# Patient Record
Sex: Female | Born: 1960 | Race: Black or African American | Hispanic: No | Smoking: Never smoker
Health system: Southern US, Community
[De-identification: ages and names within clinical notes are randomized; demographics above are authoritative.]

## PROBLEM LIST (undated history)

## (undated) DIAGNOSIS — Z9221 Personal history of antineoplastic chemotherapy: Secondary | ICD-10-CM

## (undated) DIAGNOSIS — M1990 Unspecified osteoarthritis, unspecified site: Secondary | ICD-10-CM

## (undated) DIAGNOSIS — Z803 Family history of malignant neoplasm of breast: Secondary | ICD-10-CM

## (undated) DIAGNOSIS — Z1379 Encounter for other screening for genetic and chromosomal anomalies: Secondary | ICD-10-CM

## (undated) DIAGNOSIS — E785 Hyperlipidemia, unspecified: Secondary | ICD-10-CM

## (undated) DIAGNOSIS — R0600 Dyspnea, unspecified: Secondary | ICD-10-CM

## (undated) DIAGNOSIS — Z973 Presence of spectacles and contact lenses: Secondary | ICD-10-CM

## (undated) DIAGNOSIS — Z1509 Genetic susceptibility to other malignant neoplasm: Secondary | ICD-10-CM

## (undated) DIAGNOSIS — E7800 Pure hypercholesterolemia, unspecified: Secondary | ICD-10-CM

## (undated) DIAGNOSIS — I10 Essential (primary) hypertension: Secondary | ICD-10-CM

## (undated) DIAGNOSIS — C50919 Malignant neoplasm of unspecified site of unspecified female breast: Secondary | ICD-10-CM

## (undated) DIAGNOSIS — F329 Major depressive disorder, single episode, unspecified: Secondary | ICD-10-CM

## (undated) DIAGNOSIS — R0989 Other specified symptoms and signs involving the circulatory and respiratory systems: Secondary | ICD-10-CM

## (undated) DIAGNOSIS — R971 Elevated cancer antigen 125 [CA 125]: Secondary | ICD-10-CM

## (undated) DIAGNOSIS — H9190 Unspecified hearing loss, unspecified ear: Secondary | ICD-10-CM

## (undated) DIAGNOSIS — K219 Gastro-esophageal reflux disease without esophagitis: Secondary | ICD-10-CM

## (undated) DIAGNOSIS — G8929 Other chronic pain: Secondary | ICD-10-CM

## (undated) DIAGNOSIS — Z8544 Personal history of malignant neoplasm of other female genital organs: Secondary | ICD-10-CM

## (undated) DIAGNOSIS — E119 Type 2 diabetes mellitus without complications: Secondary | ICD-10-CM

## (undated) DIAGNOSIS — R200 Anesthesia of skin: Secondary | ICD-10-CM

## (undated) DIAGNOSIS — R05 Cough: Secondary | ICD-10-CM

## (undated) DIAGNOSIS — D509 Iron deficiency anemia, unspecified: Secondary | ICD-10-CM

## (undated) DIAGNOSIS — Z1501 Genetic susceptibility to malignant neoplasm of breast: Secondary | ICD-10-CM

## (undated) DIAGNOSIS — Z9289 Personal history of other medical treatment: Secondary | ICD-10-CM

## (undated) DIAGNOSIS — Z800 Family history of malignant neoplasm of digestive organs: Secondary | ICD-10-CM

## (undated) DIAGNOSIS — Z923 Personal history of irradiation: Secondary | ICD-10-CM

## (undated) DIAGNOSIS — F32A Depression, unspecified: Secondary | ICD-10-CM

## (undated) DIAGNOSIS — Z171 Estrogen receptor negative status [ER-]: Secondary | ICD-10-CM

## (undated) DIAGNOSIS — C50512 Malignant neoplasm of lower-outer quadrant of left female breast: Secondary | ICD-10-CM

## (undated) DIAGNOSIS — H40001 Preglaucoma, unspecified, right eye: Secondary | ICD-10-CM

## (undated) DIAGNOSIS — D649 Anemia, unspecified: Secondary | ICD-10-CM

## (undated) HISTORY — PX: UPPER GI ENDOSCOPY: SHX6162

## (undated) HISTORY — DX: Genetic susceptibility to malignant neoplasm of breast: Z15.01

## (undated) HISTORY — PX: HERNIA REPAIR: SHX51

## (undated) HISTORY — PX: MULTIPLE TOOTH EXTRACTIONS: SHX2053

## (undated) HISTORY — DX: Genetic susceptibility to other malignant neoplasm: Z15.09

## (undated) HISTORY — DX: Family history of malignant neoplasm of breast: Z80.3

## (undated) HISTORY — DX: Encounter for other screening for genetic and chromosomal anomalies: Z13.79

## (undated) HISTORY — DX: Family history of malignant neoplasm of digestive organs: Z80.0

## (undated) HISTORY — PX: TUBAL LIGATION: SHX77

---

## 1998-01-16 ENCOUNTER — Inpatient Hospital Stay (HOSPITAL_COMMUNITY): Admission: AD | Admit: 1998-01-16 | Discharge: 1998-01-16 | Payer: Self-pay | Admitting: Obstetrics and Gynecology

## 1999-03-20 ENCOUNTER — Other Ambulatory Visit: Admission: RE | Admit: 1999-03-20 | Discharge: 1999-03-20 | Payer: Self-pay | Admitting: Gynecology

## 1999-06-12 ENCOUNTER — Encounter: Admission: RE | Admit: 1999-06-12 | Discharge: 1999-09-10 | Payer: Self-pay | Admitting: Sports Medicine

## 1999-09-01 ENCOUNTER — Encounter (INDEPENDENT_AMBULATORY_CARE_PROVIDER_SITE_OTHER): Payer: Self-pay | Admitting: Specialist

## 1999-09-01 ENCOUNTER — Other Ambulatory Visit: Admission: RE | Admit: 1999-09-01 | Discharge: 1999-09-01 | Payer: Self-pay | Admitting: Gynecology

## 2000-06-01 ENCOUNTER — Other Ambulatory Visit: Admission: RE | Admit: 2000-06-01 | Discharge: 2000-06-01 | Payer: Self-pay | Admitting: Gynecology

## 2000-06-18 ENCOUNTER — Ambulatory Visit (HOSPITAL_COMMUNITY): Admission: RE | Admit: 2000-06-18 | Discharge: 2000-06-21 | Payer: Self-pay | Admitting: Gynecology

## 2000-06-21 ENCOUNTER — Encounter: Payer: Self-pay | Admitting: Gynecology

## 2001-07-19 ENCOUNTER — Other Ambulatory Visit: Admission: RE | Admit: 2001-07-19 | Discharge: 2001-07-19 | Payer: Self-pay | Admitting: Gynecology

## 2001-08-23 ENCOUNTER — Inpatient Hospital Stay (HOSPITAL_COMMUNITY): Admission: AD | Admit: 2001-08-23 | Discharge: 2001-08-23 | Payer: Self-pay | Admitting: *Deleted

## 2001-09-17 ENCOUNTER — Inpatient Hospital Stay (HOSPITAL_COMMUNITY): Admission: AD | Admit: 2001-09-17 | Discharge: 2001-09-17 | Payer: Self-pay | Admitting: Gynecology

## 2001-10-24 ENCOUNTER — Emergency Department (HOSPITAL_COMMUNITY): Admission: EM | Admit: 2001-10-24 | Discharge: 2001-10-24 | Payer: Self-pay | Admitting: Emergency Medicine

## 2001-10-26 ENCOUNTER — Encounter: Admission: RE | Admit: 2001-10-26 | Discharge: 2002-01-24 | Payer: Self-pay | Admitting: Gynecology

## 2001-11-12 ENCOUNTER — Inpatient Hospital Stay (HOSPITAL_COMMUNITY): Admission: AD | Admit: 2001-11-12 | Discharge: 2001-11-12 | Payer: Self-pay | Admitting: Gynecology

## 2002-01-28 ENCOUNTER — Inpatient Hospital Stay: Admission: AD | Admit: 2002-01-28 | Discharge: 2002-01-28 | Payer: Self-pay | Admitting: Obstetrics and Gynecology

## 2002-02-02 ENCOUNTER — Inpatient Hospital Stay (HOSPITAL_COMMUNITY): Admission: AD | Admit: 2002-02-02 | Discharge: 2002-02-05 | Payer: Self-pay | Admitting: *Deleted

## 2002-02-02 ENCOUNTER — Encounter (INDEPENDENT_AMBULATORY_CARE_PROVIDER_SITE_OTHER): Payer: Self-pay | Admitting: *Deleted

## 2002-02-04 HISTORY — PX: TUBAL LIGATION: SHX77

## 2002-03-14 ENCOUNTER — Other Ambulatory Visit: Admission: RE | Admit: 2002-03-14 | Discharge: 2002-03-14 | Payer: Self-pay | Admitting: Gynecology

## 2002-07-20 ENCOUNTER — Encounter: Payer: Self-pay | Admitting: Emergency Medicine

## 2002-07-20 ENCOUNTER — Emergency Department (HOSPITAL_COMMUNITY): Admission: EM | Admit: 2002-07-20 | Discharge: 2002-07-20 | Payer: Self-pay | Admitting: Emergency Medicine

## 2003-02-27 ENCOUNTER — Emergency Department (HOSPITAL_COMMUNITY): Admission: EM | Admit: 2003-02-27 | Discharge: 2003-02-27 | Payer: Self-pay

## 2003-03-01 ENCOUNTER — Emergency Department (HOSPITAL_COMMUNITY): Admission: EM | Admit: 2003-03-01 | Discharge: 2003-03-01 | Payer: Self-pay | Admitting: Emergency Medicine

## 2003-03-15 ENCOUNTER — Emergency Department (HOSPITAL_COMMUNITY): Admission: EM | Admit: 2003-03-15 | Discharge: 2003-03-16 | Payer: Self-pay | Admitting: Emergency Medicine

## 2004-05-30 ENCOUNTER — Other Ambulatory Visit: Admission: RE | Admit: 2004-05-30 | Discharge: 2004-05-30 | Payer: Self-pay | Admitting: Gynecology

## 2004-08-08 ENCOUNTER — Emergency Department (HOSPITAL_COMMUNITY): Admission: EM | Admit: 2004-08-08 | Discharge: 2004-08-09 | Payer: Self-pay | Admitting: Emergency Medicine

## 2004-09-03 ENCOUNTER — Emergency Department (HOSPITAL_COMMUNITY): Admission: EM | Admit: 2004-09-03 | Discharge: 2004-09-04 | Payer: Self-pay | Admitting: Emergency Medicine

## 2004-09-04 ENCOUNTER — Inpatient Hospital Stay (HOSPITAL_COMMUNITY): Admission: EM | Admit: 2004-09-04 | Discharge: 2004-09-05 | Payer: Self-pay | Admitting: Psychiatry

## 2004-09-04 ENCOUNTER — Ambulatory Visit: Payer: Self-pay | Admitting: Psychiatry

## 2005-07-23 ENCOUNTER — Other Ambulatory Visit: Admission: RE | Admit: 2005-07-23 | Discharge: 2005-07-23 | Payer: Self-pay | Admitting: Gynecology

## 2005-12-22 ENCOUNTER — Ambulatory Visit (HOSPITAL_COMMUNITY): Admission: RE | Admit: 2005-12-22 | Discharge: 2005-12-22 | Payer: Self-pay | Admitting: Gynecology

## 2005-12-29 ENCOUNTER — Ambulatory Visit (HOSPITAL_COMMUNITY): Admission: RE | Admit: 2005-12-29 | Discharge: 2005-12-29 | Payer: Self-pay | Admitting: Gastroenterology

## 2006-03-19 ENCOUNTER — Other Ambulatory Visit (HOSPITAL_COMMUNITY): Admission: RE | Admit: 2006-03-19 | Discharge: 2006-04-05 | Payer: Self-pay | Admitting: Psychiatry

## 2006-03-23 ENCOUNTER — Ambulatory Visit: Payer: Self-pay | Admitting: Psychiatry

## 2006-04-14 ENCOUNTER — Ambulatory Visit (HOSPITAL_COMMUNITY): Payer: Self-pay | Admitting: Psychiatry

## 2006-05-05 ENCOUNTER — Ambulatory Visit (HOSPITAL_COMMUNITY): Payer: Self-pay | Admitting: Psychiatry

## 2006-10-10 ENCOUNTER — Emergency Department (HOSPITAL_COMMUNITY): Admission: EM | Admit: 2006-10-10 | Discharge: 2006-10-10 | Payer: Self-pay | Admitting: Emergency Medicine

## 2006-11-03 ENCOUNTER — Ambulatory Visit (HOSPITAL_COMMUNITY): Payer: Self-pay | Admitting: Psychiatry

## 2006-12-01 ENCOUNTER — Ambulatory Visit (HOSPITAL_COMMUNITY): Payer: Self-pay | Admitting: Psychiatry

## 2007-01-24 ENCOUNTER — Ambulatory Visit (HOSPITAL_COMMUNITY): Payer: Self-pay | Admitting: Psychiatry

## 2007-02-02 ENCOUNTER — Ambulatory Visit (HOSPITAL_COMMUNITY): Payer: Self-pay | Admitting: Psychiatry

## 2007-03-14 ENCOUNTER — Ambulatory Visit (HOSPITAL_COMMUNITY): Payer: Self-pay | Admitting: Psychiatry

## 2007-05-30 ENCOUNTER — Other Ambulatory Visit: Admission: RE | Admit: 2007-05-30 | Discharge: 2007-05-30 | Payer: Self-pay | Admitting: Gynecology

## 2007-06-10 HISTORY — PX: BREAST LUMPECTOMY: SHX2

## 2007-11-30 ENCOUNTER — Ambulatory Visit (HOSPITAL_COMMUNITY): Payer: Self-pay | Admitting: Psychiatry

## 2007-12-14 ENCOUNTER — Ambulatory Visit (HOSPITAL_COMMUNITY): Payer: Self-pay | Admitting: Psychiatry

## 2008-01-20 ENCOUNTER — Encounter: Admission: RE | Admit: 2008-01-20 | Discharge: 2008-01-20 | Payer: Self-pay | Admitting: Gastroenterology

## 2008-01-25 ENCOUNTER — Ambulatory Visit (HOSPITAL_COMMUNITY): Payer: Self-pay | Admitting: Psychiatry

## 2008-01-30 ENCOUNTER — Ambulatory Visit (HOSPITAL_COMMUNITY): Admission: RE | Admit: 2008-01-30 | Discharge: 2008-01-30 | Payer: Self-pay | Admitting: Gastroenterology

## 2008-06-13 ENCOUNTER — Other Ambulatory Visit: Admission: RE | Admit: 2008-06-13 | Discharge: 2008-06-13 | Payer: Self-pay | Admitting: Gynecology

## 2008-06-13 ENCOUNTER — Ambulatory Visit: Payer: Self-pay | Admitting: Gynecology

## 2009-02-05 ENCOUNTER — Ambulatory Visit: Payer: Self-pay | Admitting: Gynecology

## 2010-04-10 ENCOUNTER — Encounter: Payer: Self-pay | Admitting: Physician Assistant

## 2010-04-10 ENCOUNTER — Ambulatory Visit: Payer: Self-pay | Admitting: Internal Medicine

## 2010-04-10 DIAGNOSIS — H9190 Unspecified hearing loss, unspecified ear: Secondary | ICD-10-CM

## 2010-04-10 DIAGNOSIS — F329 Major depressive disorder, single episode, unspecified: Secondary | ICD-10-CM | POA: Insufficient documentation

## 2010-04-10 DIAGNOSIS — R5381 Other malaise: Secondary | ICD-10-CM

## 2010-04-10 DIAGNOSIS — R5383 Other fatigue: Secondary | ICD-10-CM

## 2010-04-21 ENCOUNTER — Ambulatory Visit: Payer: Self-pay | Admitting: Physician Assistant

## 2010-04-21 LAB — CONVERTED CEMR LAB
RBC Folate: 813 ng/mL — ABNORMAL HIGH (ref 180–600)
Vitamin B-12: 646 pg/mL (ref 211–911)

## 2010-04-22 ENCOUNTER — Telehealth: Payer: Self-pay | Admitting: Physician Assistant

## 2010-04-22 DIAGNOSIS — D509 Iron deficiency anemia, unspecified: Secondary | ICD-10-CM

## 2010-05-02 ENCOUNTER — Telehealth: Payer: Self-pay | Admitting: Physician Assistant

## 2010-05-05 ENCOUNTER — Telehealth: Payer: Self-pay | Admitting: Physician Assistant

## 2010-05-07 ENCOUNTER — Ambulatory Visit (HOSPITAL_COMMUNITY): Admission: RE | Admit: 2010-05-07 | Discharge: 2010-05-07 | Payer: Self-pay | Admitting: Internal Medicine

## 2010-05-09 LAB — CONVERTED CEMR LAB
AST: 14 units/L (ref 0–37)
Basophils Absolute: 0 10*3/uL (ref 0.0–0.1)
Calcium: 9 mg/dL (ref 8.4–10.5)
Chloride: 102 meq/L (ref 96–112)
Creatinine, Ser: 0.6 mg/dL (ref 0.40–1.20)
Eosinophils Absolute: 0.1 10*3/uL (ref 0.0–0.7)
Eosinophils Relative: 0 % (ref 0–5)
Glucose, Bld: 80 mg/dL (ref 70–99)
MCHC: 31.2 g/dL (ref 30.0–36.0)
MCV: 87.4 fL (ref 78.0–100.0)
Monocytes Absolute: 0.9 10*3/uL (ref 0.1–1.0)
Monocytes Relative: 7 % (ref 3–12)
Neutrophils Relative %: 63 % (ref 43–77)
OCCULT 1: NEGATIVE
OCCULT 2: NEGATIVE
Potassium: 4.2 meq/L (ref 3.5–5.3)
RDW: 16.4 % — ABNORMAL HIGH (ref 11.5–15.5)
Sodium: 139 meq/L (ref 135–145)
Total Bilirubin: 0.2 mg/dL — ABNORMAL LOW (ref 0.3–1.2)
WBC: 13 10*3/uL — ABNORMAL HIGH (ref 4.0–10.5)

## 2010-06-02 ENCOUNTER — Encounter: Payer: Self-pay | Admitting: Physician Assistant

## 2010-06-06 ENCOUNTER — Ambulatory Visit: Payer: Self-pay | Admitting: Physician Assistant

## 2010-06-06 DIAGNOSIS — K2990 Gastroduodenitis, unspecified, without bleeding: Secondary | ICD-10-CM

## 2010-06-06 DIAGNOSIS — R82998 Other abnormal findings in urine: Secondary | ICD-10-CM

## 2010-06-06 DIAGNOSIS — D72829 Elevated white blood cell count, unspecified: Secondary | ICD-10-CM | POA: Insufficient documentation

## 2010-06-06 DIAGNOSIS — K5900 Constipation, unspecified: Secondary | ICD-10-CM

## 2010-06-06 DIAGNOSIS — K5909 Other constipation: Secondary | ICD-10-CM | POA: Insufficient documentation

## 2010-06-06 DIAGNOSIS — K2970 Gastritis, unspecified, without bleeding: Secondary | ICD-10-CM | POA: Insufficient documentation

## 2010-06-06 LAB — CONVERTED CEMR LAB
Bilirubin Urine: NEGATIVE
KOH Prep: NEGATIVE
Ketones, urine, test strip: NEGATIVE
Nitrite: NEGATIVE
Pap Smear: NEGATIVE
Urobilinogen, UA: 0.2
Whiff Test: NEGATIVE
pH: 5

## 2010-06-07 ENCOUNTER — Encounter: Payer: Self-pay | Admitting: Physician Assistant

## 2010-06-10 LAB — CONVERTED CEMR LAB
Bacteria, UA: NONE SEEN
Cholesterol, target level: 200 mg/dL
Eosinophils Absolute: 0.1 10*3/uL (ref 0.0–0.7)
Hemoglobin: 11.8 g/dL — ABNORMAL LOW (ref 12.0–15.0)
LDL Goal: 160 mg/dL
Lymphocytes Relative: 27 % (ref 12–46)
MCHC: 32.2 g/dL (ref 30.0–36.0)
MCV: 88 fL (ref 78.0–100.0)
Monocytes Absolute: 0.6 10*3/uL (ref 0.1–1.0)
Neutro Abs: 6.5 10*3/uL (ref 1.7–7.7)
RDW: 17.4 % — ABNORMAL HIGH (ref 11.5–15.5)
Squamous Epithelial / LPF: NONE SEEN /lpf
Total CHOL/HDL Ratio: 4.4
Triglycerides: 106 mg/dL (ref <150)
VLDL: 21 mg/dL (ref 0–40)
WBC: 9.9 10*3/uL (ref 4.0–10.5)

## 2010-06-11 ENCOUNTER — Encounter: Payer: Self-pay | Admitting: Physician Assistant

## 2010-09-23 ENCOUNTER — Telehealth (INDEPENDENT_AMBULATORY_CARE_PROVIDER_SITE_OTHER): Payer: Self-pay | Admitting: Nurse Practitioner

## 2010-10-05 ENCOUNTER — Encounter: Payer: Self-pay | Admitting: Gastroenterology

## 2010-10-05 ENCOUNTER — Encounter: Payer: Self-pay | Admitting: Gynecology

## 2010-10-14 NOTE — Progress Notes (Signed)
Summary: sleep study appt  Phone Note Outgoing Call   Summary of Call: F.Y.I. PT HAVE AN APPT 06-02-10 @ 8PM . PT AWARE OF THE APPT Initial call taken by: Cheryll Dessert,  May 05, 2010 12:28 PM

## 2010-10-14 NOTE — Progress Notes (Signed)
Summary: Iron deficient  Phone Note Outgoing Call   Summary of Call: She is iron deficient Needs to start iron (Ferrous Sulfate 325 mg three times a day x 3 mos., #90, 3 refills) Please call in to her pharmacy Schedule f/u cbc in 4 weeks. Make sure stool cards done. Initial call taken by: Tereso Newcomer PA-C,  April 22, 2010 8:51 PM  Follow-up for Phone Call        [facility] [address].... pt says she have two more stool cards to do... Follow-up by: Armenia Shannon,  April 23, 2010 9:07 AM  New Problems: UNSPECIFIED IRON DEFICIENCY ANEMIA (ICD-280.9)   New Problems: UNSPECIFIED IRON DEFICIENCY ANEMIA (ICD-280.9) New/Updated Medications: FERROUS SULFATE 325 (65 FE) MG TABS (FERROUS SULFATE) Take 1 tablet by mouth three times a day    Impression & Recommendations:  Problem # 1:  UNSPECIFIED IRON DEFICIENCY ANEMIA (ICD-280.9)  She is iron deficient Needs to start iron (Ferrous Sulfate 325 mg three times a day x 3 mos., #90, 3 refills) Please call in to her pharmacy Schedule f/u cbc in 4 weeks.  Her updated medication list for this problem includes:    Ferrous Sulfate 325 (65 Fe) Mg Tabs (Ferrous sulfate) .Marland Kitchen... Take 1 tablet by mouth three times a day  Complete Medication List: 1)  Ferrous Sulfate 325 (65 Fe) Mg Tabs (Ferrous sulfate) .... Take 1 tablet by mouth three times a day

## 2010-10-14 NOTE — Letter (Signed)
Summary: *HSN Results Follow up  [facility]  [address] [place], [place] [address]   Phone: [phone]  Fax: [phone]      06/11/2010   Erin [name] [address] [place], [place]  [address]   Dear  Ms. [name],                            ____S.Drinkard,FNP   ____D. Gore,FNP       ____B. McPherson,MD   ____V. Rankins,MD    ____E. Mulberry,MD    ____N. Daphine Deutscher, FNP  ____D. Reche Dixon, MD    ____K. Philipp Deputy, MD    __x__S. Alben Spittle, PA-C     This letter is to inform you that your recent test(s):  ___x____Pap Smear    _______Lab Test     _______X-ray    ___x____ is normal  _______ requires a medication change  _______ requires a follow-up lab visit  _______ requires a follow-up visit with your provider   Comments:       _________________________________________________________ If you have any questions, please contact our office                     Sincerely,  Tereso Newcomer PA-C [facility]

## 2010-10-14 NOTE — Progress Notes (Signed)
Summary: Split Night Referral  Phone Note Outgoing Call   Summary of Call: I spoke with Erin [name] 04-17-10 regarding her appt to Sleep study because the place can't contact her  she said that she is going to call them to make an appt but she haven't done it  and I call back again 04-21-10 and I leave a voice mail. Initial call taken by: Cheryll Dessert,  May 02, 2010 9:32 AM  Follow-up for Phone Call        thank you  let me know what happens Tereso Newcomer PA-C  May 04, 2010 9:10 PM  OK   Follow-up by: Cheryll Dessert,  May 05, 2010 9:05 AM

## 2010-10-14 NOTE — Letter (Signed)
Summary: PT INFORMATION SHEET  PT INFORMATION SHEET   Imported By: Arta Bruce 05/13/2010 12:58:25  _____________________________________________________________________  External Attachment:    Type:   Image     Comment:   External Document

## 2010-10-14 NOTE — Assessment & Plan Note (Signed)
Summary: CPP///BC   Vital Signs:  Patient profile:   50 year old female Menstrual status:  regular LMP:     05/24/2010 Height:      63.5 inches Weight:      169 pounds BMI:     29.57 Temp:     98.1 degrees F oral Pulse rate:   86 / minute Pulse rhythm:   regular Resp:     18 per minute BP sitting:   120 / 82  (left arm) Cuff size:   large  Vitals Entered By: Armenia Shannon (June 06, 2010 10:43 AM) CC: CPE, patient needs refill on Rx for Iron Pain Assessment Patient in pain? no       Does patient need assistance? Functional Status Self care Ambulation Normal LMP (date): 05/24/2010     Enter LMP: 05/24/2010   Primary Care Provider:  Tereso Newcomer, PA-C  CC:  CPE and patient needs refill on Rx for Iron.  History of Present Illness: Here for CPP. No pap in 1-2 years.  No h/o abnl paps. No abnormal bleeding, discharge, ithcing or burning. Mammo up to date. No FHx of breast cancer. Not taking calcium. PHQ9= 10 . . . Daughter is hard to take care of.  Denies being depressed.  Used to take meds.  Does not think she needs.  No suicidal thoughts.  Offered counseling.  She declined.   Problems Prior to Update: 1)  Gastritis  (ICD-535.50) 2)  Constipation  (ICD-564.00) 3)  Urinalysis, Abnormal  (ICD-791.9) 4)  Leukocytosis  (ICD-288.60) 5)  Unspecified Iron Deficiency Anemia  (ICD-280.9) 6)  Tubal Ligation, Hx of  (ICD-V26.51) 7)  Deafness  (ICD-389.9) 8)  Preventive Health Care  (ICD-V70.0) 9)  Fatigue  (ICD-780.79) 10)  Depression  (ICD-311)  Current Medications (verified): 1)  Ferrous Sulfate 325 (65 Fe) Mg Tabs (Ferrous Sulfate) .... Take 1 Tablet By Mouth Three Times A Day  Allergies (verified): No Known Drug Allergies  Past History:  Past Medical History: Depression   a.  admitted to behavioral health in 2006 ? h/o PUD stress incontinence ? h/o renal cyst (cannot find doc in [place]) Fatigue   a. waiting on patient to do sleep study  Review of  Systems      See HPI General:  Denies chills and fever. CV:  Denies chest pain or discomfort. Resp:  Complains of cough; denies sputum productive and wheezing; has a cough with weather changes. GI:  Complains of constipation; denies bloody stools, dark tarry stools, diarrhea, vomiting, and vomiting blood; feels bloated; stools darker since starting iron. GU:  Denies dysuria and hematuria. MS:  Denies joint pain. Derm:  Denies rash. Psych:  Denies suicidal thoughts/plans. Heme:  Denies bleeding.  Physical Exam  General:  alert, well-developed, and well-nourished.   Head:  normocephalic and atraumatic.   Eyes:  pupils equal, pupils round, and pupils reactive to light.   Ears:  R ear normal, L ear normal, and no external deformities.   Nose:  no external deformity.   Mouth:  pharynx pink and moist, no erythema, no exudates, and fair dentition.   Neck:  supple, no thyromegaly, and no cervical lymphadenopathy.   Breasts:  skin/areolae normal, no masses, no abnormal thickening, no nipple discharge, no tenderness, and no adenopathy.   Lungs:  normal breath sounds, no crackles, and no wheezes.   Heart:  normal rate, regular rhythm, and no murmur.   Abdomen:  soft, normal bowel sounds, no hepatomegaly, and epigastric tenderness.  Rectal:  no external abnormalities.   Genitalia:  normal introitus, no external lesions, no vaginal discharge, mucosa pink and moist, no vaginal or cervical lesions, no vaginal atrophy, no friaility or hemorrhage, normal uterus size and position, and no adnexal masses or tenderness.   Msk:  normal ROM.   Pulses:  R posterior tibial normal, R dorsalis pedis normal, L posterior tibial normal, and L dorsalis pedis normal.   Extremities:  trace left pedal edema and trace right pedal edema.   Neurologic:  alert & oriented X3 and cranial nerves II-XII intact.   Skin:  turgor normal.   Psych:  normally interactive.     Impression & Recommendations:  Problem # 1:   LEUKOCYTOSIS (ICD-288.60) repeat cbc  Orders: T-CBC w/Diff (16[address]-60454)  Problem # 2:  UNSPECIFIED IRON DEFICIENCY ANEMIA (ICD-280.9) repeat cbc  Her updated medication list for this problem includes:    Ferrous Sulfate 325 (65 Fe) Mg Tabs (Ferrous sulfate) .Marland Kitchen... Take 1 tablet by mouth three times a day  Orders: T-CBC w/Diff ([id number])  Problem # 3:  PREVENTIVE HEALTH CARE (ICD-V70.0) mammo done Td up to date  Orders: UA Dipstick w/o Micro (manual) (29562) KOH/ WET Mount ([phone]) T-Pap Smear, Thin Prep (57846) T-Lipid Profile (96295-28413)  Problem # 4:  FATIGUE (ICD-780.79) has not done sleep study yet needs to reschedule  Problem # 5:  GASTRITIS (ICD-535.50)  recent stool cards neg trial of pepcid x 6 weeks, then as needed  Her updated medication list for this problem includes:    Pepcid 20 Mg Tabs (Famotidine) .Marland Kitchen... Take 1 tablet by mouth two times a day  Problem # 6:  CONSTIPATION (ICD-564.00)  high fiber diet miralax as needed   Her updated medication list for this problem includes:    Miralax Powd (Polyethylene glycol 3350) .Marland Kitchen... Dissolve one capful in water or juice and drink one glass once daily as needed for constipation  Problem # 7:  DEPRESSION (ICD-311) phq9=10 but Sukhman Kocher denies sig depression and does not want counseling or medication  Complete Medication List: 1)  Ferrous Sulfate 325 (65 Fe) Mg Tabs (Ferrous sulfate) .... Take 1 tablet by mouth three times a day 2)  Miralax Powd (Polyethylene glycol 3350) .... Dissolve one capful in water or juice and drink one glass once daily as needed for constipation 3)  Pepcid 20 Mg Tabs (Famotidine) .... Take 1 tablet by mouth two times a day 4)  Caltrate 600+d 600-400 Mg-unit Tabs (Calcium carbonate-vitamin d) .... Take 1 tablet by mouth two times a day  Other Orders: T-Culture, Urine ([id number]) T- * Misc. Laboratory test [phone])  Patient Instructions: 1)  You have refills on your  prescription for Iron.  When you run out, you can call the pharmacy to refill. 2)  Please reschedule your sleep study. 3)  Take pepcid two times a day every day for 6 weeks.  Then, take two times a day as needed for indigestion or stomach pain. 4)  Eat a high fiber diet. 5)  Use Miralax once daily as needed for constipation. 6)  Please schedule a follow-up appointment in 2-3 months for your stomach.  7)  Take calcium +vitamin D daily.  Prescriptions: CALTRATE 600+D 600-400 MG-UNIT TABS (CALCIUM CARBONATE-VITAMIN D) Take 1 tablet by mouth two times a day  #60 x 11   Entered and Authorized by:   Tereso Newcomer PA-C   Signed by:   Tereso Newcomer PA-C on 06/06/2010   Method used:   Print then Give  to Patient   RxID:   [id number] PEPCID 20 MG TABS (FAMOTIDINE) Take 1 tablet by mouth two times a day  #60 x 3   Entered and Authorized by:   Tereso Newcomer PA-C   Signed by:   Tereso Newcomer PA-C on 06/06/2010   Method used:   Print then Give to Patient   RxID:   [id number] MIRALAX  POWD (POLYETHYLENE GLYCOL 3350) dissolve one capful in water or juice and drink one glass once daily as needed for constipation  #1 bottle x 3   Entered and Authorized by:   Tereso Newcomer PA-C   Signed by:   Tereso Newcomer PA-C on 06/06/2010   Method used:   Print then Give to Patient   RxID:   [id number] FERROUS SULFATE 325 (65 FE) MG TABS (FERROUS SULFATE) Take 1 tablet by mouth three times a day  #90 x 5   Entered and Authorized by:   Tereso Newcomer PA-C   Signed by:   Tereso Newcomer PA-C on 06/06/2010   Method used:   Print then Give to Patient   RxID:   [id number]   Laboratory Results   Urine Tests    Routine Urinalysis   Color: yellow Glucose: negative   (Normal Range: Negative) Bilirubin: negative   (Normal Range: Negative) Ketone: negative   (Normal Range: Negative) Spec. Gravity: >=1.030   (Normal Range: 1.003-1.035) Blood: small   (Normal Range: Negative) pH: 5.0   (Normal Range:  5.0-8.0) Protein: trace   (Normal Range: Negative) Urobilinogen: 0.2   (Normal Range: 0-1) Nitrite: negative   (Normal Range: Negative) Leukocyte Esterace: small   (Normal Range: Negative)      Wet Mount Source: vaginal WBC/hpf: 10-20 Bacteria/hpf: rare Clue cells/hpf: none  Negative whiff Yeast/hpf: none Wet Mount KOH: Negative Trichomonas/hpf: none

## 2010-10-14 NOTE — Assessment & Plan Note (Signed)
Summary: ** SIGN LANGUAGE, NEW PT EST CARE, GCCN CARD REQ MAMMOGRAM/LR   Vital Signs:  Patient profile:   50 year old female Menstrual status:  regular LMP:     03/09/2010 Weight:      163 pounds Temp:     97.8 degrees F oral Pulse rate:   65 / minute Pulse rhythm:   regular Resp:     18 per minute BP sitting:   100 / 60  (left arm) Cuff size:   large  Vitals Entered By: Tereso Newcomer,  PA-C CC: NP...Marland Kitchen pt says her bp is never this high.Marland KitchenMarland KitchenMarland KitchenMarland Kitchenpt wants to meet the provider... pt wants appt for cpp Is Patient Diabetic? No Pain Assessment Patient in pain? no       Does patient need assistance? Functional Status Self care Ambulation Normal LMP (date): 03/09/2010     Menstrual Status regular Enter LMP: 03/09/2010   Primary Care Provider:  Tereso Newcomer, PA-C  CC:  NP...Marland Kitchen pt says her bp is never this high.Marland KitchenMarland KitchenMarland KitchenMarland Kitchenpt wants to meet the provider... pt wants appt for cpp.  History of Present Illness: New Patient. Previously seeing Dr. Lily Peer in [place] (ob/gyn) for all care.  She is deaf and is here with an interpreter.  Here to est. Health maint: Periods regular.  Heavy for 2-3 days.   Td - unknown. No mammo in years. Last pap 2009.  She does note a history of awakening with shortness of breath for 10+ years now.  She feels uncomfortable when she lays flat.  She denies chest pain.  She denies exertional shortness of breath.  She denies cough or pedal edema.  She denies any history of heart disease.  She does feel quite fatigued.  She will fall asleep during the day.  She denies waking up with a headache.  She is not sure she snores.  Habits & Providers  Alcohol-Tobacco-Diet     Tobacco Status: never  Exercise-Depression-Behavior     Drug Use: no  Current Medications (verified): 1)  None  Allergies (verified): No Known Drug Allergies  Past History:  Past Medical History: Depression   a.  admitted to behavioral health in 2006 ? h/o PUD stress incontinence ?  h/o renal cyst (cannot find doc in [place])  Past Surgical History: Tubal ligation  Family History: CAD - bro died with MI/pneumonia age 71 Family History Hypertension-mom, sis DM - PGM unknown cancer in grandparent  Social History: G3P2 Married separated Never Smoked Alcohol use-no (rare alcohol) Drug use-no Smoking Status:  never Drug Use:  no  Review of Systems      See HPI General:  Denies chills and fever. CV:  See HPI. Resp:  Denies cough. GI:  Denies bloody stools, constipation, and dark tarry stools. GU:  Denies hematuria. Neuro:  See HPI. Psych:  Denies depression. Endo:  Denies cold intolerance and heat intolerance.  Physical Exam  General:  well-nourished, well-developed female in no acute distress Head:  normocephalic atraumatic Eyes:  PERRL, sclera are clear Ears:  TMs clear bilaterally Mouth:  oropharynx pink without exudate Neck:  supple without thyromegaly or lymphadenopathy Lungs:  clear to auscultation bilaterally, no wheezing, no rales Heart:  regular rate and rhythm, normal S1-S2, no murmur Abdomen:  soft, nontender, no organomegaly Neurologic:  cranial nerves II through XII intact Psych:  normally interactive.     Impression & Recommendations:  Problem # 1:  FATIGUE (ICD-780.79)  ?sleep apnea get sleep study ck labs  Orders: T-Comprehensive Metabolic Panel ([id number]) T-CBC w/Diff (  16109-60454) T-TSH [phone]) Split Night (Split Night)  Problem # 2:  PREVENTIVE HEALTH CARE (ICD-V70.0)  arrange mammo schedule cpp  Orders: T-CBC w/Diff ([id number]) Mammogram (Screening) (Mammo)  Patient Instructions: 1)  Please schedule a follow-up appointment in 6 weeks for CPP with Lita Flynn.  Come fasting for labs. 2)  Someone will call to schedule a sleep test to check for sleep apnea.   Appended Document: ** SIGN LANGUAGE, NEW PT EST CARE, GCCN CARD REQ MAMMOGRAM/LR     Allergies: No Known Drug Allergies   Other Orders: Tdap  => 73yrs IM [phone]) Admin 1st Vaccine (96295) Admin 1st Vaccine [facility]) [phone])    Tetanus/Td Vaccine    Vaccine Type: Tdap    Site: left deltoid    Mfr: Sanofi Pasteur    Dose: 0.5 ml    Route: IM    Given by: Levon Hedger    Exp. Date: 09/28/2011    Lot #: [id number]    VIS given: 08/02/07 version given April 10, 2010.

## 2010-10-14 NOTE — Progress Notes (Signed)
Summary: Office Visit/DEPRESSION SCREENING  Office Visit/DEPRESSION SCREENING   Imported By: Arta Bruce 06/20/2010 09:38:51  _____________________________________________________________________  External Attachment:    Type:   Image     Comment:   External Document

## 2010-10-16 NOTE — Progress Notes (Signed)
Summary: Coughing, fatigue  Phone Note Call from Patient   Summary of Call: Been sick for two weeks and wants to see doctor.  Started coughing, had a cold with pretty bad chest congestion, not feeling well, very weak, very tired.  Cough is productive of white mucus -- was yellow, now white, somewhat thick.  Did have CP in the beginning, not now.  Sweating a lot, feels "horrible."  Usually very active-now very lethargic.  Has been taking OTC Zyrtec thinking was allergies, makes her sleepy, helps a little.  Denies fever.  Cough is primarily when she's up, not at night.  Fatigue is the worst.   Was trying to take Delsun, but made her vomit.    Attempted to suggest home management and OTC medications for cough - pt. wants to be seen due to overwhelming fatigue/lethargy.  Advised that no appointments are available this week -- advised that only option is UC or ED for evaluation and treatment.  Verbalized understanding and agreement.    Initial call taken by: Dutch Quint RN,  September 23, 2010 2:50 PM

## 2011-01-30 NOTE — Consult Note (Signed)
[facility]  Patient:    Erin [name], Erin [name] Visit Number: [id number] MRN: [id number]          Service Type: OBS Location: [facility] Attending Physician:  Tonye Royalty Dictated by:   Gaetano Hawthorne. Lily Peer, M.D. 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  Some dizziness for the past several weeks with no nausea or vomiting.  She has complained mostly of reflux with bad taste in her mouth.  She tried Rolaids and Tums without any improvement in her symptoms.  Food, according to her, would make it worse.  This has only happened during the pregnancy.  She denies any ear pressure or pain.  She was recently treated with a Z-Pak for an upper respiratory tract infection.  She denies having passed out.  The dizziness started Thursday.  She is a gestational diabetic, diet controlled, and her blood sugars have been fine at home according to the patient.  There were no neurological complaints, and positive fetal movements were reported.  PHYSICAL EXAMINATION:  GENERAL:                      Well-developed, well-nourished female resting comfortably in the emergency room.  She had just gotten through eating and was totally asymptomatic.  VITAL SIGNS:                  Her orthostatics are as follows.  Blood pressure lying down was 115/65 with pulse of 82; sitting was 104/65 with a pulse of 79; standing was 103/55 with a pulse of 70.  Heart rate lying down was 97, sitting up was 106, and standing was 121.  HEENT:                        Unremarkable.  NECK:                         Supple.  Trachea midline.  No carotid bruits, no thyromegaly.  LUNGS:                        Clear to auscultation without rhonchi  or wheezes.  HEART:                        Regular rate and rhythm with no murmurs or gallops.  ABDOMEN/PELVIC:               Examinations not done.  DIAGNOSTIC DATA:              The patient had a 12-lead EKG in the emergency room which demonstrated normal sinus rhythm.  Her comprehensive metabolic panel was essentially unremarkable.  Her sugar was 83 mm/dl.  Hemoglobin 11.9, hematocrit 35.2, platelets count 295,000.  IMPRESSION:                   It appears the patient is suffering from gastroesophageal reflux attributed to colic and fundal pressure underneath her stomach.  She was instructed to continue her gestational diabetic diet as previously prescribed,  make sure she has adequate hydration, and her snacks. We will give her Reglan 10 mg to take 1 p.o. q.6-8h. p.r.n.  She has not seen her ENT for several years.  If her dizziness continues, she may need further evaluation for vertigo in the event she may be experiencing Menieres disease. Otherwise, the patient was told to report to the emergency room or [facility]. All of this was carried orally with the use of an interpreter, and written instructions are provided as well.  Of note, she was placed on the monitor, and there were no contractions demonstrated. Dictated by:   Gaetano Hawthorne Lily Peer, M.D. Attending Physician:  Tonye Royalty DD:  11/12/01 TD:  11/12/01 Job: [id number]

## 2011-01-30 NOTE — Consult Note (Signed)
[facility]  Patient:    Erin [name], Erin [name] Visit Number: [id number] MRN: [id number]          Service Type: OBS Location: [facility] Attending Physician:  Tonye Royalty Dictated by:   Gaetano Hawthorne. Lily Peer, M.D. 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  She denied any shortness of breath.  No tightness in her chest.  We did get an EKG, 12 lead, which was normal and her vital signs were blood pressure 113/67, respirations were 20 and pulse was 103 and temperature was 96.8 and fetal heart tones were appreciated.  PHYSICAL EXAMINATION:  EXTREMITIES:                  On examination, pain radiated from the point of compression to the base of the neck, the axilla, the shoulder and down to the forearm and hand.  She had a complaint of paresthesia along this area, especially the volar aspect of the fourth and fifth digits.  She had stated that sensory symptoms were aggravated at midnight, especially when sleeping or prolonged use of her extremities.  No muscle atrophy was seen per se or some mild weakness.  She did have good radial and popliteal pulses and good nailbed coloration.  ASSESSMENT AND PLAN:          I contacted the orthopedic surgeon on call, which was Dr. Patricia Nettle, and he had recommended the patient have an MRI of the neck.  My concern was the possibility that we may be dealing with a subclavian steal syndrome.  After the MRI, he  will take a look at it and determine further management.  Otherwise, for now, I told her she can apply heat and Tylenol and I will give her a note to keep her out of work until she follows up with the orthopedic surgeon next week, if he does not see her this weekend after the MRI.  All this was explained to the patient in written form, all questions were answered and will follow accordingly. Dictated by:   Gaetano Hawthorne Lily Peer, M.D. Attending Physician:  Tonye Royalty DD:  09/17/01 TD:  09/17/01 Job: [phone] [id number]

## 2011-01-30 NOTE — Op Note (Signed)
[facility]  Patient:    KAROLYNN, INFANTINO Visit Number: [id number] MRN: [id number]          Service Type: OBS Location: 910A 9132 01 Attending Physician:  Wetzel Bjornstad Dictated by:   Gaetano Hawthorne Lily Peer, M.D. Proc. Date: 02/02/02 Admit Date:  02/02/2002 Discharge Date: 02/05/2002                             Operative Report  INDICATIONS FOR OPERATION:    A 50 year old gravida 3, para 2, AB1 status post normal spontaneous vaginal delivery May 23.  Patient requesting elective permanent sterilization.  Previously counseled as to risks, benefits, pros and cons, and failure rates.  PREOPERATIVE DIAGNOSES:       Request for immediate postpartum tubal sterilization.  POSTOPERATIVE DIAGNOSES:      Request for immediate postpartum tubal sterilization.  ANESTHESIA:                   Epidural and 0.25% Marcaine local infiltration at incision site.  SURGEON:                      Juan H. Lily Peer, M.D.  PROCEDURE PERFORMED:          Postpartum tubal sterilization procedure, bilateral, Pomeroy technique.  DESCRIPTION OF OPERATION:     After the patient was adequately counseled, she was taken to the operating room where she was redosed through her epidural catheter.  Abdomen was prepped and draped in the usual sterile fashion.  For additional local anesthetic effect, the 0.25% Marcaine was infiltrated subcutaneously at the umbilical region whereby a semilunar incision was made. The incision as carried down to the fascia and the fascia was incised and the peritoneal cavity was entered.  With the utilization [employer] retractors for exposure and having placed the patient in Trendelenburg position with the use of a vein retractor, the left fallopian tube was brought into view and was grasped with a Babcock clamp.  The tube was identified to its distal end for proper identification.  The proximal portion of the fallopian tube was suture ligated with 3-0  Vicryl x2 and a 2 cm segment was incised and passed off the operative field for histologic evaluation.  The remaining stumps were Bovie cauterized.  Similar procedure was carried out on the contralateral side.  The fascia was reapproximated with a running stitch of 3-0 Vicryl suture and the skin was reapproximated with a subcuticular stitch of 4-0 plain catgut suture. Patient was transferred to the recovery room with stable vital signs.  Blood loss was minimal.  Fluid resuscitation consisted of 500 cc of lactated Ringers. Dictated by:   Gaetano Hawthorne Lily Peer, M.D. Attending Physician:  Wetzel Bjornstad DD:  02/04/02 TD:  02/07/02 Job: [id number]

## 2011-01-30 NOTE — Discharge Summary (Signed)
[facility]  Patient:    Erin [name], Erin [name] Visit Number: [id number] MRN: [id number]          Service Type: OBS Location: 910A 9132 01 Attending Physician:  Wetzel Bjornstad Dictated by:   Antony Contras, [facility] Admit Date:  02/02/2002 Discharge Date: 02/05/2002                             Discharge Summary  DISCHARGE DIAGNOSES:          1. Intrauterine pregnancy at term.                               2. History of gestational diabetes.  PROCEDURE:                    Induction of labor, normal spontaneous vaginal delivery of viable infant over midline episiotomy.  Postpartum BTL.  HISTORY OF PRESENT ILLNESS:   The patient is a 50 year old, gravida 3, para 1-0-1-1, with an LMP of April 25, 2001, [facility] Jan 30, 2002.  Prenatal risk factors include history of advanced maternal age, anti-M antibody, gestational diabetes.  The patient is also a deaf mute.  LABORATORY DATA:              Blood type A positive.  Anti-M antibody. RPR, HBSAG, and HIV nonreactive.  Amniocentesis normal.  HOSPITAL COURSE:              The patient was admitted on Feb 02, 2002, at 40-1/2 weeks for induction of labor secondary to her gestational diabetes. She did progress to complete dilatation and delivered an Apgars 8 and 9 female infant weighing 7 pounds 13 ounces.  Cord blood pH 7.19, over midline episiotomy without extension.  Placenta was intact.  Postpartum, the patient did wish to have a tubal sterilization and this procedure was performed by Gaetano Hawthorne. Lily Peer, M.D.  Postpartum tubal sterilization Pomeroy technique. The patient remained afebrile and had no difficulty voiding.  She was able to be discharged in satisfactory condition on her second postpartum day.  CBC; hematocrit 10.1, hemoglobin 30.1, platelets 186. Dictated by:   Antony Contras, [facility] Attending Physician:  Wetzel Bjornstad DD:  02/21/02 TD:  02/23/02 Job: [id number]

## 2011-01-30 NOTE — Discharge Summary (Signed)
Erin [name], Erin [name]                ACCOUNT NO.:  [id number]   MEDICAL RECORD NO.:  [id number]          PATIENT TYPE:  IPS   LOCATION:  0305                          FACILITY:  [facility]   PHYSICIAN:  Syed T. Arfeen, M.D.   DATE OF BIRTH:  Jan 27, 1961   DATE OF ADMISSION:  09/04/2004  DATE OF DISCHARGE:  09/05/2004                                 DISCHARGE SUMMARY   IDENTIFYING INFORMATION:  The patient is a 50 year old, separated African-  American female, voluntarily admitted on December 22.  Most of the  information was obtained through the interpreter as the patient is deaf.  The patient presents with a history of some depression for some time,  reported very stressed at work and home.  She was feeling frustrated and  left out at work.  She is here with her husband, again, whom she is  currently separated from and is apparently trying to take her 76-year-old  child.  The patient stated that she was feeling very distressed with low  self-esteem and left work yesterday feeling frustrated and having some vague  thoughts about hurting herself.  The patient reported that she has been on  Zoloft; however, she has been taking this on and off.  The patient denied  any psychotic symptoms and denied any current suicidal ideation.   PAST PSYCHIATRIC HISTORY:  This is the patient's first admission to [facility].  No other hospitalizations.  She has a history of  suicidal attempt a few years ago.   DRUG ALLERGIES:  No known drug allergies.  Denies any medical problem.   PHYSICAL EXAMINATION:  Essentially within normal limits.  Urine drug screen  was negative. CBC was normal.  TSH is 2.002, alcohol level was normal.  Chemistry was within normal limits.   MENTAL STATUS EXAMINATION:  The patient is an alert, oriented female with  good eye contact; interacting with the writers, case Production designer, theatre/television/film, and  interpreter.  The patient is deaf, able to communicate with the interpreter  and sign  languages.  She appears to be a little bit frustrated with anxious  mood.  Thought process clear, coherent, no evidence of psychosis, cognitive  function intact.  Memory good.  Insight judgment is adequate.   ADMITTING DIAGNOSES:  AXIS I.  Depressive disorder, NOS.  AXIS II.  Deferred.  AXIS III.  Deaf.  AXIS IV.  Problem with primary support group, occupation, and other  psychosocial problems.  AXIS V.  35.   HOSPITAL COURSE:  The patient was admitted and ordered routine p.r.n. in  standing medication.  She was started on Zoloft 50 mg daily and Ambien 10 mg  q.h.s. p.r.n. for sleep.  She was also encouraged to participate in group  therapy.  She was seen the next day with the help of interpreter at length  and she expressed that she was stressed out at job and was unable to take it  any more and made a suicidal remark and left the job.  She said that it was  an impulsive decision and was an impulsive remark.  She  denies any active or  passive suicidal thoughts.  Denies any homicidal thoughts.  She says that  she has 2 children, a 41-year-old and a 68 year old.  A telephone session,  done with the mother, who was very supportive and very involved with the  treatment plan. She stated that she is taking care of the kids.  The patient  says that she is on Zoloft on and off which helps, and has been given from  her OB/GYN.  She says that she has been tolerating the Zoloft; however,  sometimes she misses her medication.  She reported feeling better in the  morning and wanted to be discharged so that she can join her family.  The  patient's mother reported that her husband lives in [place], they have been  separated for some time. She told that husband is not any more planning to  take the kids back.  The mother assured that she will stay with the patient  until the patient started feeling better.  Extensive discharge planning was  discussed and the family appears to be very supportive of the  treatment  plan.  The patient denies any active or passive suicidal thoughts, appears  upbeat and feels better with the Zoloft.   She has reported a positive response to this crisis intervention.   CONDITION ON DISCHARGE:  Remarkably improved.  Mood stable, increased coping  and socialization skills, thought process clear, well directed, and no  suicidal thoughts, no psychotic symptoms.  Denies any perceptions, appears  motivated and excited with after care plan.   DISCHARGE MEDICATIONS:  Zoloft 50 mg once a day.   DISCHARGE DIAGNOSES:  AXIS I.  Depressive disorder, NOS.  AXIS II.  Deferred.  AXIS III.  Deaf.  AXIS IV.  Mild-to-moderate.  AXIS V.  70.   FOLLOWUP:  The patient was given a followup at [facility]  with _________ on Tuesday, January 3 at 10:15 p.m. and also with Dr.  Tomasa Rand on Monday, September 22, 2004 at 2 p.m. at [address]  The patient was also informed that she needed to call and arrange for an  interpreter for MD and therapist.  She was also encouraged to call [facility] [place] for counseling, phone number [phone].     Syed   STA/MEDQ  D:  09/17/2004  T:  09/17/2004  Job:  [id number]

## 2011-01-30 NOTE — H&P (Signed)
NAMEJAHZARA, SLATTERY                ACCOUNT NO.:  [id number]   MEDICAL RECORD NO.:  [id number]          PATIENT TYPE:  IPS   LOCATION:  0305                          FACILITY:  [facility]   PHYSICIAN:  Syed T. Arfeen, M.D.   DATE OF BIRTH:  March 17, 1961   DATE OF ADMISSION:  09/04/2004  DATE OF DISCHARGE:  09/05/2004                         PSYCHIATRIC ADMISSION ASSESSMENT   IDENTIFYING INFORMATION:  This is a 50 year old, separated, African-American  female, voluntarily admitted on September 04, 2004.   HISTORY OF PRESENT ILLNESS:  This is obtained through the services of an  interpreter, as patient is deaf.  Patient presents with a history of some  depression for some time, reporting very stressed at work and at home.  Feeling frustrated and left out of the interaction at work.  She states that  her husband, again, who she is currently separated from, is apparently  trying to take her 48 year old.  That is making her feel very distressed.  Patient apparently left work yesterday, feeling frustrated and was having  some fleeting thoughts of hurting herself.  She, at the time, felt unsafe  and came here to be assessed.  The patient was on Zoloft.  She has been off  her medications for some time.  Is denying any psychotic symptoms.  Denies  any current suicidal ideation.   PAST PSYCHIATRIC HISTORY:  First admission to [facility].  No  other hospitalizations.  Has a history of a suicide attempt years ago.   SOCIAL HISTORY:  This is a 50 year old, separated, African-American female;  separated for five months; has a 48 year old and 60 year old.  Her mother,  who is of support, lives in [place], is there for her and is currently  with the children.  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   FAMILY HISTORY:  Unclear.   ALCOHOL/DRUG HISTORY:  Denies any alcohol or drug use.   PRIMARY CARE Ameer Sanden:  Unknown.   PAST MEDICAL HISTORY:   None.   MEDICATIONS:  Was on Zoloft.  Has been noncompliant for some time.  Has been  on Paxil in the past.   DRUG ALLERGIES:  No known drug allergies.   PHYSICAL EXAMINATION:  Review of systems is noncontributory, although  patient is, as stated, deaf.  Denies any medical problems.  VITAL SIGNS:  Temperature is 98.1; 78 heart rate; 20 respirations; blood  pressure is 138/77.  GENERAL:  This is a well-nourished female in no acute distress.  She is  healthy appearing.  LUNGS:  Respirations are easy.  EXTREMITIES:  No clubbing or deformities.  SKIN:  No rashes or lacerations.  NEUROLOGIC:  Intact.   Urine drug screen negative.  CBC within normal limits.  TSH is 2.002.  Alcohol level less than 5.  CMET is within normal limits.   MENTAL STATUS EXAM:  She is an alert, oriented female with good eye contact.  Interacting well with Clinical research associate, case Production designer, theatre/television/film and interpreter.  Speech, as  stated, patient is deaf.  Mood - Patient feels  frustrated.  Her affect is  somewhat anxious.  Thought processes are coherent.  There is no evidence of  psychosis.  Cognitive function intact.  Memory is good.  Judgment and  insight are good.  She appears sincere.   ADMISSION DIAGNOSES:   AXIS I:  Depressive disorder, not otherwise specified.   AXIS II:  Deferred.   AXIS III:  Deaf.   AXIS IV:  Problems with primary support group, occupation and other  psychosocial problems.   AXIS V:  Current is 35; past year is 38.   PLAN:  Admission for suicidal thoughts, contract for safety, stabilize mood  and thinking.  We will resume Zoloft and reinforce medication compliance.  We will speak to a case manager about children and current worries about her  husband wanting to take her 74 year old.  Case manager will contact mother  about the children's safety and support.   TENTATIVE LENGTH OF STAY:  Three to four days.     Gilford Silvius   JO/MEDQ  D:  09/05/2004  T:  09/05/2004  Job:  [id number]

## 2011-01-30 NOTE — H&P (Signed)
[facility]  Patient:    Erin [name], Erin [name] Visit Number: [id number] MRN: [id number]          Service Type: OBS Location: 910B 9161 01 Attending Physician:  Wetzel Bjornstad Dictated by:   Katy Fitch, M.D. Admit Date:  02/02/2002                           History and Physical  CHIEF COMPLAINT:              40-week pregnancy and diabetic.  HISTORY OF PRESENT ILLNESS:   The patient is a 50 year old, G3, P1, at 40 weeks and 3 days, who presents to the office for routine visit.  The patient is noted to have a reactive stress test and good sugars under control. The patient is noted to have a cervix that is 1 cm dilated and 50% effaced. The patient had an amniocentesis earlier in pregnancy and noted to be a 46XX. She is also congenitally deaf.  The patient has been seen throughout her pregnancy having an interpreter and has good follow-up with her diabetes and stress test and her fetal ultrasound for growth on Jan 30, 2002, was noted to be 3500 g.  PAST MEDICAL HISTORY:         Deaf.  PAST SURGICAL HISTORY:        None.  PAST OBSTETRICAL HISTORY:     On vaginal delivery, 7 pounds 3 ounces.  MEDICATIONS:                  Prenatal vitamins.  ALLERGIES:                    None.  SOCIAL HISTORY:               Without any tobacco, alcohol, or drugs.  FAMILY HISTORY:               Without any mental retardation or epithelial cancers.  PHYSICAL EXAMINATION:         Blood pressure 130/78.  HEENT:                        Clear.  LUNGS:                        Clear to auscultation bilaterally.  HEART:                        Regular rate and rhythm.  ABDOMEN:                      Gravid and nontender.  Estimated fetal weight 8 pounds.  PELVIC:                       Cervix 150 and -2.  ASSESSMENT AND PLAN:          A 50 year old, G3, P1 at 40-1/2 weeks.  Will be induced for diabetes.  Will have Cervidil placed.  The patient is GBS negative.  The  patient will be started on Pitocin after 12 hours of Cervidil. Dictated by:   Katy Fitch, M.D. Attending Physician:  Wetzel Bjornstad DD:  02/02/02 TD:  02/02/02 Job: [id number]

## 2011-05-20 ENCOUNTER — Other Ambulatory Visit: Payer: Self-pay | Admitting: Gynecology

## 2011-05-20 DIAGNOSIS — Z1231 Encounter for screening mammogram for malignant neoplasm of breast: Secondary | ICD-10-CM

## 2011-06-03 ENCOUNTER — Ambulatory Visit (HOSPITAL_COMMUNITY): Admission: RE | Admit: 2011-06-03 | Payer: Self-pay | Source: Ambulatory Visit

## 2011-06-04 ENCOUNTER — Other Ambulatory Visit: Payer: Self-pay | Admitting: Gynecology

## 2011-06-04 DIAGNOSIS — Z1231 Encounter for screening mammogram for malignant neoplasm of breast: Secondary | ICD-10-CM

## 2011-06-12 ENCOUNTER — Ambulatory Visit (HOSPITAL_COMMUNITY)
Admission: RE | Admit: 2011-06-12 | Discharge: 2011-06-12 | Disposition: A | Discharge status: Home / Self Care | Payer: Self-pay | Source: Ambulatory Visit | Attending: Gynecology | Admitting: Gynecology

## 2011-06-12 DIAGNOSIS — Z1231 Encounter for screening mammogram for malignant neoplasm of breast: Secondary | ICD-10-CM

## 2012-06-13 ENCOUNTER — Ambulatory Visit (HOSPITAL_COMMUNITY): Payer: Self-pay | Attending: Gynecology

## 2013-08-02 ENCOUNTER — Encounter (HOSPITAL_COMMUNITY): Payer: Self-pay

## 2014-10-12 ENCOUNTER — Emergency Department (HOSPITAL_COMMUNITY)
Admission: EM | Admit: 2014-10-12 | Discharge: 2014-10-12 | Disposition: A | Discharge status: Home / Self Care | Payer: Self-pay | Attending: Emergency Medicine | Admitting: Emergency Medicine

## 2014-10-12 ENCOUNTER — Emergency Department (HOSPITAL_COMMUNITY): Payer: Self-pay

## 2014-10-12 ENCOUNTER — Encounter (HOSPITAL_COMMUNITY): Payer: Self-pay

## 2014-10-12 DIAGNOSIS — M545 Low back pain, unspecified: Secondary | ICD-10-CM

## 2014-10-12 DIAGNOSIS — R059 Cough, unspecified: Secondary | ICD-10-CM

## 2014-10-12 DIAGNOSIS — R05 Cough: Secondary | ICD-10-CM | POA: Insufficient documentation

## 2014-10-12 DIAGNOSIS — R0602 Shortness of breath: Secondary | ICD-10-CM | POA: Insufficient documentation

## 2014-10-12 LAB — BASIC METABOLIC PANEL
Anion gap: 4 — ABNORMAL LOW (ref 5–15)
BUN: 7 mg/dL (ref 6–23)
CHLORIDE: 105 mmol/L (ref 96–112)
CO2: 27 mmol/L (ref 19–32)
CREATININE: 0.6 mg/dL (ref 0.50–1.10)
Calcium: 8.7 mg/dL (ref 8.4–10.5)
GFR calc Af Amer: >90 mL/min (ref >90)
Glucose, Bld: 126 mg/dL — ABNORMAL HIGH (ref 70–99)
POTASSIUM: 3.8 mmol/L (ref 3.5–5.1)
Sodium: 136 mmol/L (ref 135–145)

## 2014-10-12 LAB — I-STAT TROPONIN, ED: TROPONIN I, POC: 0 ng/mL (ref 0.00–0.08)

## 2014-10-12 LAB — CBC WITH DIFFERENTIAL/PLATELET
BASOS ABS: 0 10*3/uL (ref 0.0–0.1)
BASOS PCT: 0 % (ref 0–1)
EOS ABS: 0.1 10*3/uL (ref 0.0–0.7)
Eosinophils Relative: 1 % (ref 0–5)
HEMATOCRIT: 33.9 % — AB (ref 36.0–46.0)
Hemoglobin: 10.8 g/dL — ABNORMAL LOW (ref 12.0–15.0)
LYMPHS PCT: 30 % (ref 12–46)
Lymphs Abs: 3.1 10*3/uL (ref 0.7–4.0)
MCH: 26.5 pg (ref 26.0–34.0)
MCHC: 31.9 g/dL (ref 30.0–36.0)
MCV: 83.3 fL (ref 78.0–100.0)
MONO ABS: 0.6 10*3/uL (ref 0.1–1.0)
Monocytes Relative: 6 % (ref 3–12)
Neutro Abs: 6.6 10*3/uL (ref 1.7–7.7)
Neutrophils Relative %: 63 % (ref 43–77)
PLATELETS: 281 10*3/uL (ref 150–400)
RBC: 4.07 MIL/uL (ref 3.87–5.11)
RDW: 15.9 % — AB (ref 11.5–15.5)
WBC: 10.3 10*3/uL (ref 4.0–10.5)

## 2014-10-12 LAB — BRAIN NATRIURETIC PEPTIDE: B NATRIURETIC PEPTIDE 5: 32.8 pg/mL (ref 0.0–100.0)

## 2014-10-12 MED ORDER — CYCLOBENZAPRINE HCL 10 MG PO TABS: 10.0000 mg | ORAL_TABLET | Freq: Two times a day (BID) | ORAL | Status: DC | PRN

## 2014-10-12 MED ORDER — BENZONATATE 100 MG PO CAPS: 100.0000 mg | ORAL_CAPSULE | Freq: Three times a day (TID) | ORAL | Status: DC

## 2014-10-12 MED ORDER — IBUPROFEN 600 MG PO TABS: 600.0000 mg | ORAL_TABLET | Freq: Four times a day (QID) | ORAL | Status: DC | PRN

## 2014-10-12 MED ORDER — PREDNISONE 20 MG PO TABS: 40.0000 mg | ORAL_TABLET | Freq: Every day | ORAL | Status: DC

## 2014-10-12 MED ORDER — PREDNISONE 20 MG PO TABS
60.0000 mg | ORAL_TABLET | Freq: Once | ORAL | Status: AC
  Administered 2014-10-12: 60 mg via ORAL
  Filled 2014-10-12: qty 3

## 2014-10-12 MED ORDER — IBUPROFEN 800 MG PO TABS
800.0000 mg | ORAL_TABLET | Freq: Once | ORAL | Status: AC
  Administered 2014-10-12: 800 mg via ORAL
  Filled 2014-10-12: qty 1

## 2014-10-12 NOTE — Discharge Instructions (Signed)
Acute Bronchitis Bronchitis is inflammation of the airways that extend from the windpipe into the lungs (bronchi). The inflammation often causes mucus to develop. This leads to a cough, which is the most common symptom of bronchitis.  In acute bronchitis, the condition usually develops suddenly and goes away over time, usually in a couple weeks. Smoking, allergies, and asthma can make bronchitis worse. Repeated episodes of bronchitis may cause further lung problems.  CAUSES Acute bronchitis is most often caused by the same virus that causes a cold. The virus can spread from person to person (contagious) through coughing, sneezing, and touching contaminated objects. SIGNS AND SYMPTOMS   Cough.   Fever.   Coughing up mucus.   Body aches.   Chest congestion.   Chills.   Shortness of breath.   Sore throat.  DIAGNOSIS  Acute bronchitis is usually diagnosed through a physical exam. Your health care provider will also ask you questions about your medical history. Tests, such as chest X-rays, are sometimes done to rule out other conditions.  TREATMENT  Acute bronchitis usually goes away in a couple weeks. Oftentimes, no medical treatment is necessary. Medicines are sometimes given for relief of fever or cough. Antibiotic medicines are usually not needed but may be prescribed in certain situations. In some cases, an inhaler may be recommended to help reduce shortness of breath and control the cough. A cool mist vaporizer may also be used to help thin bronchial secretions and make it easier to clear the chest.  HOME CARE INSTRUCTIONS  Get plenty of rest.   Drink enough fluids to keep your urine clear or pale yellow (unless you have a medical condition that requires fluid restriction). Increasing fluids may help thin your respiratory secretions (sputum) and reduce chest congestion, and it will prevent dehydration.   Take medicines only as directed by your health care provider.  If  you were prescribed an antibiotic medicine, finish it all even if you start to feel better.  Avoid smoking and secondhand smoke. Exposure to cigarette smoke or irritating chemicals will make bronchitis worse. If you are a smoker, consider using nicotine gum or skin patches to help control withdrawal symptoms. Quitting smoking will help your lungs heal faster.   Reduce the chances of another bout of acute bronchitis by washing your hands frequently, avoiding people with cold symptoms, and trying not to touch your hands to your mouth, nose, or eyes.   Keep all follow-up visits as directed by your health care provider.  SEEK MEDICAL CARE IF: Your symptoms do not improve after 1 week of treatment.  SEEK IMMEDIATE MEDICAL CARE IF:  You develop an increased fever or chills.   You have chest pain.   You have severe shortness of breath.  You have bloody sputum.   You develop dehydration.  You faint or repeatedly feel like you are going to pass out.  You develop repeated vomiting.  You develop a severe headache. MAKE SURE YOU:   Understand these instructions.  Will watch your condition.  Will get help right away if you are not doing well or get worse. Document Released: 10/08/2004 Document Revised: 01/15/2014 Document Reviewed: 02/21/2013 [facility] Patient Information 2015 [place], [place]. This information is not intended to replace advice given to you by your health care provider. Make sure you discuss any questions you have with your health care provider.  Back Pain, Adult Low back pain is very common. About 1 in 5 people have back pain.The cause of low back pain is  rarely dangerous. The pain often gets better over time.About half of people with a sudden onset of back pain feel better in just 2 weeks. About 8 in 10 people feel better by 6 weeks.  CAUSES Some common causes of back pain include:  Strain of the muscles or ligaments supporting the spine.  Wear and tear  (degeneration) of the spinal discs.  Arthritis.  Direct injury to the back. DIAGNOSIS Most of the time, the direct cause of low back pain is not known.However, back pain can be treated effectively even when the exact cause of the pain is unknown.Answering your caregiver's questions about your overall health and symptoms is one of the most accurate ways to make sure the cause of your pain is not dangerous. If your caregiver needs more information, he or she may order lab work or imaging tests (X-rays or MRIs).However, even if imaging tests show changes in your back, this usually does not require surgery. HOME CARE INSTRUCTIONS For many people, back pain returns.Since low back pain is rarely dangerous, it is often a condition that people can learn to [facility] their own.   Remain active. It is stressful on the back to sit or stand in one place. Do not sit, drive, or stand in one place for more than 30 minutes at a time. Take short walks on level surfaces as soon as pain allows.Try to increase the length of time you walk each day.  Do not stay in bed.Resting more than 1 or 2 days can delay your recovery.  Do not avoid exercise or work.Your body is made to move.It is not dangerous to be active, even though your back may hurt.Your back will likely heal faster if you return to being active before your pain is gone.  Pay attention to your body when you bend and lift. Many people have less discomfortwhen lifting if they bend their knees, keep the load close to their bodies,and avoid twisting. Often, the most comfortable positions are those that put less stress on your recovering back.  Find a comfortable position to sleep. Use a firm mattress and lie on your side with your knees slightly bent. If you lie on your back, put a pillow under your knees.  Only take over-the-counter or prescription medicines as directed by your caregiver. Over-the-counter medicines to reduce pain and inflammation  are often the most helpful.Your caregiver may prescribe muscle relaxant drugs.These medicines help dull your pain so you can more quickly return to your normal activities and healthy exercise.  Put ice on the injured area.  Put ice in a plastic bag.  Place a towel between your skin and the bag.  Leave the ice on for 15-20 minutes, 03-04 times a day for the first 2 to 3 days. After that, ice and heat may be alternated to reduce pain and spasms.  Ask your caregiver about trying back exercises and gentle massage. This may be of some benefit.  Avoid feeling anxious or stressed.Stress increases muscle tension and can worsen back pain.It is important to recognize when you are anxious or stressed and learn ways to manage it.Exercise is a great option. SEEK MEDICAL CARE IF:  You have pain that is not relieved with rest or medicine.  You have pain that does not improve in 1 week.  You have new symptoms.  You are generally not feeling well. SEEK IMMEDIATE MEDICAL CARE IF:   You have pain that radiates from your back into your legs.  You develop new bowel  or bladder control problems.  You have unusual weakness or numbness in your arms or legs.  You develop nausea or vomiting.  You develop abdominal pain.  You feel faint. Document Released: 08/31/2005 Document Revised: 03/01/2012 Document Reviewed: 01/02/2014 [facility] Patient Information 2015 [place], [place]. This information is not intended to replace advice given to you by your health care provider. Make sure you discuss any questions you have with your health care provider.   Back Exercises Back exercises help treat and prevent back injuries. The goal of back exercises is to increase the strength of your abdominal and back muscles and the flexibility of your back. These exercises should be started when you no longer have back pain. Back exercises include:  Pelvic Tilt. Lie on your back with your knees bent. Tilt your pelvis  until the lower part of your back is against the floor. Hold this position 5 to 10 sec and repeat 5 to 10 times.  Knee to Chest. Pull first 1 knee up against your chest and hold for 20 to 30 seconds, repeat this with the other knee, and then both knees. This may be done with the other leg straight or bent, whichever feels better.  Sit-Ups or Curl-Ups. Bend your knees 90 degrees. Start with tilting your pelvis, and do a partial, slow sit-up, lifting your trunk only 30 to 45 degrees off the floor. Take at least 2 to 3 seconds for each sit-up. Do not do sit-ups with your knees out straight. If partial sit-ups are difficult, simply do the above but with only tightening your abdominal muscles and holding it as directed.  Hip-Lift. Lie on your back with your knees flexed 90 degrees. Push down with your feet and shoulders as you raise your hips a couple inches off the floor; hold for 10 seconds, repeat 5 to 10 times.  Back arches. Lie on your stomach, propping yourself up on bent elbows. Slowly press on your hands, causing an arch in your low back. Repeat 3 to 5 times. Any initial stiffness and discomfort should lessen with repetition over time.  Shoulder-Lifts. Lie face down with arms beside your body. Keep hips and torso pressed to floor as you slowly lift your head and shoulders off the floor. Do not overdo your exercises, especially in the beginning. Exercises may cause you some mild back discomfort which lasts for a few minutes; however, if the pain is more severe, or lasts for more than 15 minutes, do not continue exercises until you see your caregiver. Improvement with exercise therapy for back problems is slow.  See your caregivers for assistance with developing a proper back exercise program. Document Released: 10/08/2004 Document Revised: 11/23/2011 Document Reviewed: 07/02/2011 [facility] Patient Information 2015 [place], [place]. This information is not intended to replace advice given to you by your  health care provider. Make sure you discuss any questions you have with your health care provider.   Emergency Department Resource Guide 1) Find a Doctor and Pay Out of Pocket Although you won't have to find out who is covered by your insurance plan, it is a good idea to ask around and get recommendations. You will then need to call the office and see if the doctor you have chosen will accept you as a new patient and what types of options they offer for patients who are self-pay. Some doctors offer discounts or will set up payment plans for their patients who do not have insurance, but you will need to ask so you aren't  surprised when you get to your appointment.  2) Contact Your Local Health Department Not all health departments have doctors that can see patients for sick visits, but many do, so it is worth a call to see if yours does. If you don't know where your local health department is, you can check in your phone book. The CDC also has a tool to help you locate your state's health department, and many state websites also have listings of all of their local health departments.  3) Find a [facility] If your illness is not likely to be very severe or complicated, you may want to try a walk in clinic. These are popping up all over the country in pharmacies, drugstores, and shopping centers. They're usually staffed by nurse practitioners or physician assistants that have been trained to treat common illnesses and complaints. They're usually fairly quick and inexpensive. However, if you have serious medical issues or chronic medical problems, these are probably not your best option.  No Primary Care Doctor: - Call Health Connect at  [phone] - they can help you locate a primary care doctor that  accepts your insurance, provides certain services, etc. - Physician Referral Service- [phone]  Chronic Pain Problems: Organization         Address  Phone   Notes  [facility]  [phone] Patients need to be referred by their primary care doctor.   Medication Assistance: Organization         Address  Phone   Notes  [facility] Medication [facility] [address] [place], [place] [address] [phone] --Must be a resident of [facility] -- Must have NO insurance coverage whatsoever (no Medicaid/ Medicare, etc.) -- The pt. MUST have a primary care doctor that directs their care regularly and follows them in the community   MedAssist  [phone]   [employer]  [phone]    Agencies that provide inexpensive medical care: Organization         Address  Phone   Notes  [place]  [phone]   [facility]    [phone]   [facility] [place], [place] [address] [phone]   [place] [address], [place] [phone]   Planned Parenthood    [phone]   [facility]    [phone]   [place] and [address], [place] Phone:  [phone], Fax:  [phone] Hours of Operation:  9 am - 6 pm, M-F.  Also accepts Medicaid/Medicare and self-pay.  [facility] for [place] [address], [place] Phone: [phone], Fax: [phone]. Hours of Operation:  8:30 am - 5:30 pm, M-F.  Also accepts Medicaid and self-pay.  [facility] [address] [place] Phone: [phone]   [place], [place], [place] [phone], Ext. [phone] Mondays & Thursdays: 7-9 AM.  First 15 patients are seen on a first come, first serve basis.    [place] Providers:  Organization         Address  Phone   Notes  [facility] [address], [place] [phone] Also accepts self-pay patients.  [place], [place],  [place]  [phone]   [place], [address], [place] [phone]   [place] [address], [place] [phone]   Lucianne Lei [address], [place]   [phone] Only accepts [place] Access [place] patients after they have their name applied to their card.   Self-Pay (no insurance) in [facility]:  Organization         Address  Phone   Notes  Sickle Cell Patients, [facility] Internal Medicine [place] [phone]   [facility] [place] [phone]   [facility] [place]  [place], [place], [place] [phone]   [facility]/Dr. Osei-Bonsu  [address] [place] or [address], [place] [phone] Phone number for both [place] and [place] locations is the same.  Urgent Medical and [facility] [address], [place] [phone]   [facility] [address], [place] or [address] [phone]   [facility] [address] [place] [phone], phone; [phone], fax Sees patients 1st and 3rd Saturday of every month.  Must not qualify for public or private insurance (i.e. Medicaid, Medicare, [place] Health Choice, Veterans' Benefits)  Household income should be no more than 200% of the poverty level The clinic cannot treat you if you are pregnant or think you are pregnant  Sexually transmitted diseases are not treated at the clinic.    Dental Care: Organization         Address  Phone  Notes  [facility] Department of [facility] [place] [phone] Accepts children up to age 43 who are enrolled in [place] or [place]; pregnant women with a Medicaid card; and children who have applied for Medicaid or [place] Health  Choice, but were declined, whose parents can pay a reduced fee at time of service.  [facility] Department of [facility]  [address], [place] [phone] Accepts children up to age 37 who are enrolled in [place] or [place]; pregnant women with a Medicaid card; and children who have applied for Medicaid or [place] Health Choice, but were declined, whose parents can pay a reduced fee at time of service.  [place] Adult Dental Access PROGRAM  [place] [phone] Patients are seen by appointment only. Walk-ins are not accepted. [place] will see patients 79 years of age and older. Monday - Tuesday (8am-5pm) Most Wednesdays (8:30-5pm) $30 per visit, cash only  [facility] Adult Dental Access PROGRAM  [address], [facility] [phone] Patients are seen by appointment only. Walk-ins are not accepted. [place] will see patients 19 years of age and older. One Wednesday Evening (Monthly: Volunteer Based).  $30 per visit, cash only  [place]  [phone] for adults; Children under age 70, call Graduate Pediatric Dentistry at [phone]. Children aged 52-14, please call [phone] to request a pediatric application.  Dental services are provided in all areas of dental care including fillings, crowns and bridges, complete and partial dentures, implants, gum treatment, root canals, and extractions. Preventive care is also provided. Treatment is provided to both adults and children. Patients are selected via a lottery and there is often a waiting list.  [facility] [address] [place]  [phone] [email or web address]   [facility] [address] [place], [place] [phone], Ext. [phone] Second and Fourth Thursday of each month, opens at 6:30 AM; Clinic ends at 9 AM.  Patients are seen on a first-come first-served basis, and a limited number are seen during each  clinic.   [facility]  [address] Hillard Danker [place], [place] [phone]   Eligibility Requirements You must have lived in [place], [place], or [place] counties for at least the last three months.   You cannot be eligible for state or federal sponsored [employer], including [employer], [place], or [employer].   You generally cannot be eligible for healthcare insurance through your employer.    How to apply: Eligibility screenings are held every Tuesday and Wednesday afternoon from 1:00 pm until 4:00 pm. You do not need an appointment for the interview!  [facility] [address], [place], [place]   [place]  [place] Department  [place]  [phone]    Behavioral Health Resources in the Community: Intensive Outpatient Programs Organization         Address  Phone  Notes  [place]. [address], [place], [place] [phone]   [facility] [address], [place], [place]   ADS: [facility] Drug [facility] [address] [place], [place]   [place] [address]  [place], [place] or [phone]   Substance Abuse Resources Organization         Address  Phone  Notes  Alcohol and Drug Services  [phone]   [place]  [phone]   [employer]   Chinita Pester  [phone]   Residential & Outpatient Substance Abuse Program  [phone]   Psychological Services Organization         Address  Phone  Notes  [facility] [place]  [place]  [phone]   [place] [address] [place] or [phone]    Mobile Crisis Teams Organization         Address  Phone  Notes  [facility]rapeutic Alternatives, Mobile  Crisis Care Unit  [phone]   [facility]  [address] [place], [place]   [place] [address], [place] [phone]    Self-Help/Support Groups Organization         Address  Phone             Notes  [place]. of [place] - variety of support groups  [place] Call for more information  Narcotics Anonymous (NA), [facility] [address], [employer] [place]  2 meetings at this location   Special educational needs teacher         Address  Phone  Notes  [facility] Residential Treatment [place],    [place]  [phone]   [facility]  [address] [place] [id number], [place], [place]   [place], [place] [phone] Admissions: 8am-3pm M-F  Incentives Substance [place] [address],    [place], [place] [phone]   [facility] [address] Jadene Pierini [place], [place]   [facility] [place]  Barbara Cower  [place], [place]   [facility]ight Programs - Intensive Outpatient [address], [place], [place]   [facility] ([place] [address]  [place], [place] [phone] or [phone]   [facility]idential Treatment Services (RTS) [address], [place], [place] Accepts Medicaid  [facility] [address],  [place] [phone] Substance Abuse/Addiction Treatment   [facility] Organization         Address  Phone  Notes  [facility]  [phone]   Domenic Schwab, PhD [address] Arlis Porta [place], [place]   [phone] or [phone]   [place], [place] [phone]   [place] [address], [place], [place] [phone] Insurance/Medicaid/sponsorship through [facility] [address]  [place]                                    [place], [place] [phone] [place] [address], [place] [phone]    Dr. Adele Schilder  [phone]   [facility] [place] Dept. 1) [address] [place] 2) [place] 3)  [place] [address], [place] [phone]  [phone]   [place] [phone] or [phone] (After Hours)

## 2014-10-12 NOTE — ED Provider Notes (Signed)
CSN: [id number]     Arrival date & time 10/12/14  1062 History   First MD Initiated Contact with Patient 10/12/14 0940     Chief Complaint  Patient presents with  . URI  . Back Pain     (Consider location/radiation/quality/duration/timing/severity/associated sxs/prior Treatment) Patient is a 54 y.o. female presenting with back pain. The history is provided by the patient. A language interpreter was used.  Back Pain Associated symptoms: no abdominal pain, no chest pain, no dysuria, no numbness and no weakness     Erin [name] is a 54 year old female with history of deafness who presents the ER complaining of cough, nasal congestion, back pain. History interpreted with ASL interpreter in the room with patient Patient reports her symptoms began approximately one month ago with a mild cough and nasal congestion. Patient reports the nasal congestion has since improved, however she continues to cough, and feels mild shortness of breath until she is able to cough something up. She states production with cough helps her shortness of breath subside. She states she is also having a "runny nose" in which she sometimes has difficulty breathing out of her nose. She denies any associated fever, chest pain, dizziness, weakness, nausea, vomiting, abdominal pain. Patient states her back pain is in her right lumbar region laterally, and reports it feels like a "pulled muscle". She states the pain began this morning acutely, and she feels like she "slept on it wrong". Patient states the pain is aggravated with any movement, alleviated with rest.  History reviewed. No pertinent past medical history. History reviewed. No pertinent past surgical history. History reviewed. No pertinent family history. History  Substance Use Topics  . Smoking status: Never Smoker   . Smokeless tobacco: Not on file  . Alcohol Use: No   OB History    No data available     Review of Systems  HENT: Negative for trouble  swallowing.   Eyes: Negative for visual disturbance.  Respiratory: Positive for shortness of breath.   Cardiovascular: Negative for chest pain.  Gastrointestinal: Negative for nausea, vomiting and abdominal pain.  Genitourinary: Negative for dysuria.  Musculoskeletal: Positive for back pain.  Skin: Negative for rash.  Neurological: Negative for dizziness, weakness and numbness.  Psychiatric/Behavioral: Negative.       Allergies  Review of patient's allergies indicates not on file.  Home Medications   Prior to Admission medications   Medication Sig Start Date End Date Taking? Authorizing Provider  benzonatate (TESSALON) 100 MG capsule Take 1 capsule (100 mg total) by mouth every 8 (eight) hours. 10/12/14   Carrie Mew, PA-C  cyclobenzaprine (FLEXERIL) 10 MG tablet Take 1 tablet (10 mg total) by mouth 2 (two) times daily as needed for muscle spasms. 10/12/14   Carrie Mew, PA-C  ibuprofen (ADVIL,MOTRIN) 600 MG tablet Take 1 tablet (600 mg total) by mouth every 6 (six) hours as needed. 10/12/14   Carrie Mew, PA-C  predniSONE (DELTASONE) 20 MG tablet Take 2 tablets (40 mg total) by mouth daily. 10/12/14   Carrie Mew, PA-C   BP 121/71 mmHg  Pulse 92  Temp(Src) 98.2 F (36.8 C) (Oral)  Resp 19  SpO2 100%  LMP  (LMP Unknown) Physical Exam  Constitutional: She is oriented to person, place, and time. She appears well-developed and well-nourished. No distress.  HENT:  Head: Normocephalic and atraumatic.  Mouth/Throat: Oropharynx is clear and moist. No oropharyngeal exudate.  Eyes: Right eye exhibits no discharge. Left eye exhibits no discharge.  No scleral icterus.  Neck: Normal range of motion.  Cardiovascular: Normal rate, regular rhythm and normal heart sounds.   No murmur heard. Pulmonary/Chest: Effort normal and breath sounds normal. No accessory muscle usage. No tachypnea. No respiratory distress.  Abdominal: Soft. Normal appearance. There is no tenderness. There is no  rigidity, no guarding, no tenderness at McBurney's point and negative Murphy's sign.  Musculoskeletal: Normal range of motion. She exhibits no edema or tenderness.  Neurological: She is alert and oriented to person, place, and time. No cranial nerve deficit. Coordination normal.  Skin: Skin is warm and dry. No rash noted. She is not diaphoretic.  Psychiatric: She has a normal mood and affect.    ED Course  Procedures (including critical care time) Labs Review Labs Reviewed  CBC WITH DIFFERENTIAL/PLATELET - Abnormal; Notable for the following:    Hemoglobin 10.8 (*)    HCT 33.9 (*)    RDW 15.9 (*)    All other components within normal limits  BASIC METABOLIC PANEL - Abnormal; Notable for the following:    Glucose, Bld 126 (*)    Anion gap 4 (*)    All other components within normal limits  BRAIN NATRIURETIC PEPTIDE  I-STAT TROPOININ, ED    Imaging Review Dg Chest 2 View  10/12/2014   CLINICAL DATA:  Shortness of breath, productive cough for 3 weeks, low back pain  EXAM: CHEST  2 VIEW  COMPARISON:  10/10/2006  FINDINGS: Cardiomediastinal silhouette is stable. No acute infiltrate or pleural effusion. No pulmonary edema. Mild degenerative changes mid thoracic spine.  IMPRESSION: No active cardiopulmonary disease.   Electronically Signed   By: Lahoma Crocker M.D.   On: 10/12/2014 14:23     EKG Interpretation None      MDM   Final diagnoses:  Cough  Right-sided low back pain without sciatica    Pt CXR negative for acute infiltrate. Patient well-appearing, afebrile, nontachypneic Cardec, nontachypneic, non-hypoxic, well-appearing and in no acute distress. Lab work unremarkable for acute pathology. EKG without evidence of acute injury or ectopy. Patient describing cough 4 weeks consistent with an acute bronchitis, patient's symptoms are consistent with URI, likely viral etiology. Discussed that antibiotics are not indicated for viral infections. Pt will be discharged with symptomatic  treatment.  Verbalizes understanding and is agreeable with plan. Pt is hemodynamically stable & in NAD prior to dc. Patient sent home on a short course of prednisone, and given symptomatic therapy.  Patient with back pain.  No neurological deficits and normal neuro exam.  Patient can walk but states is painful.  No loss of bowel or bladder control.  No concern for cauda equina.  No fever, night sweats, weight loss, h/o cancer, IVDU.  RICE protocol and pain medicine indicated and discussed with patient.  I spoke with social worker who also spoke with patient through interpreter to help patient set up primary care in the area. Social worker established a follow-up appointment with patient through the wellness clinic, and patient agreed to meet social worker there to help set up this appointment and follow up with a primary care doctor. I discussed return precautions with patient, and patient expresses understanding and agreement of this plan. I encouraged patient to return to the ER with any worsening of symptoms, or should she have any questions or concerns.  BP 121/71 mmHg  Pulse 92  Temp(Src) 98.2 F (36.8 C) (Oral)  Resp 19  SpO2 100%  LMP  (LMP Unknown)  Signed,  Dahlia Bailiff, PA-C  4:57 PM  Patient discussed with Dr. Virgel Manifold, MD   Carrie Mew, PA-C 10/12/14 1657  Virgel Manifold, MD 10/15/14 [phone]

## 2014-10-12 NOTE — ED Notes (Signed)
Paged for sign language interpreter

## 2014-10-12 NOTE — ED Notes (Signed)
Pt complains of chronic back pain, chronic elbow pain and cold sx x 3 weeks. Pt is deaf

## 2014-10-12 NOTE — ED Notes (Signed)
Pt made aware to return if symptoms worsen or if any life threatening symptoms occur.   

## 2014-11-07 ENCOUNTER — Encounter: Payer: Self-pay | Admitting: Internal Medicine

## 2014-11-07 ENCOUNTER — Ambulatory Visit: Payer: Self-pay | Attending: Internal Medicine | Admitting: Internal Medicine

## 2014-11-07 VITALS — BP 146/89 | HR 71 | Temp 98.1°F | Resp 16 | Ht 61.0 in | Wt 173.0 lb

## 2014-11-07 DIAGNOSIS — Z1211 Encounter for screening for malignant neoplasm of colon: Secondary | ICD-10-CM

## 2014-11-07 DIAGNOSIS — H9193 Unspecified hearing loss, bilateral: Secondary | ICD-10-CM | POA: Insufficient documentation

## 2014-11-07 DIAGNOSIS — Z79899 Other long term (current) drug therapy: Secondary | ICD-10-CM | POA: Insufficient documentation

## 2014-11-07 DIAGNOSIS — Z0000 Encounter for general adult medical examination without abnormal findings: Secondary | ICD-10-CM

## 2014-11-07 NOTE — Progress Notes (Signed)
Pt is here to establish care. Pt is here for a physical and a pap smear. Pt has an interpreter. Pt is deaf.

## 2014-11-07 NOTE — Progress Notes (Signed)
Patient ID: Erin [name], female   DOB: January 09, 1961, 54 y.o.   MRN: [id number]  CC: physical  HPI: Erin [name] is a 54 y.o. female here today to establish care and have a physical.  Patient has past medical history of deafness .  She is present with a interpreter today.  She has no past medical history and is not on any current medications.  She denies any vaginal itch, odor, lesion, or discharge. She has no C.C today. She denies tobacco, alcohol, and drug use.  Patient has No headache, No chest pain, No abdominal pain - No Nausea, No new weakness tingling or numbness, No Cough - SOB.  No Known Allergies No past medical history on file. Current Outpatient Prescriptions on File Prior to Visit  Medication Sig Dispense Refill  . ibuprofen (ADVIL,MOTRIN) 600 MG tablet Take 1 tablet (600 mg total) by mouth every 6 (six) hours as needed. 30 tablet 0  . benzonatate (TESSALON) 100 MG capsule Take 1 capsule (100 mg total) by mouth every 8 (eight) hours. (Patient not taking: Reported on 11/07/2014) 21 capsule 0  . cyclobenzaprine (FLEXERIL) 10 MG tablet Take 1 tablet (10 mg total) by mouth 2 (two) times daily as needed for muscle spasms. (Patient not taking: Reported on 11/07/2014) 20 tablet 0  . predniSONE (DELTASONE) 20 MG tablet Take 2 tablets (40 mg total) by mouth daily. (Patient not taking: Reported on 11/07/2014) 8 tablet 0   No current facility-administered medications on file prior to visit.   No family history on file. History   Social History  . Marital Status: Legally Separated    Spouse Name: N/A  . Number of Children: N/A  . Years of Education: N/A   Occupational History  . Not on file.   Social History Main Topics  . Smoking status: Never Smoker   . Smokeless tobacco: Not on file  . Alcohol Use: No  . Drug Use: No  . Sexual Activity: Not on file   Other Topics Concern  . Not on file   Social History Narrative  . No narrative on file    Review of  Systems: Constitutional: Negative for fever, chills, diaphoresis, activity change, appetite change and fatigue. HENT: Negative for ear pain, nosebleeds, congestion, facial swelling, rhinorrhea, neck pain, neck stiffness and ear discharge.  Eyes: Negative for pain, discharge, redness, itching and visual disturbance. Respiratory: Negative for cough, choking, chest tightness, shortness of breath, wheezing and stridor.  Cardiovascular: Negative for chest pain, palpitations and leg swelling. Gastrointestinal: Negative for abdominal distention. Genitourinary: Negative for dysuria, urgency, frequency, hematuria, flank pain, decreased urine volume, difficulty urinating and dyspareunia.  Musculoskeletal: Negative for back pain, joint swelling, arthralgias and gait problem. Neurological: Negative for dizziness, tremors, seizures, syncope, facial asymmetry, speech difficulty, weakness, light-headedness, numbness and headaches.  Hematological: Negative for adenopathy. Does not bruise/bleed easily. Psychiatric/Behavioral: Negative for hallucinations, behavioral problems, confusion, dysphoric mood, decreased concentration and agitation.    Objective:   Filed Vitals:   11/07/14 1244  BP: 146/89  Pulse:   Temp:   Resp:     Physical Exam: Constitutional: Patient appears well-developed and well-nourished. No distress. HENT: Normocephalic, atraumatic, External right and left ear normal. Oropharynx is clear and moist.  Eyes: Conjunctivae and EOM are normal. PERRLA, no scleral icterus. Neck: Normal ROM. Neck supple. No JVD. No tracheal deviation. No thyromegaly. CVS: RRR, S1/S2 +, no murmurs, no gallops, no carotid bruit.  Pulmonary: Effort and breath sounds normal, no stridor, rhonchi, wheezes, rales.  Abdominal: Soft. BS +,  no distension, tenderness, rebound or guarding.  Musculoskeletal: Normal range of motion. No edema and no tenderness.  Lymphadenopathy: No lymphadenopathy noted, cervical, inguinal  or axillary Neuro: Alert. Normal reflexes, muscle tone coordination. No cranial nerve deficit. Skin: Skin is warm and dry. No rash noted. Not diaphoretic. No erythema. No pallor. Psychiatric: Normal mood and affect. Behavior, judgment, thought content normal. Genitalia: Normal female without lesion, discharge or tenderness, NSSA, NT, no adnexal masses felt on exam  Breast: No tenderness, masses, or nipple abnormality     Lab Results  Component Value Date   WBC 10.3 10/12/2014   HGB 10.8* 10/12/2014   HCT 33.9* 10/12/2014   MCV 83.3 10/12/2014   PLT 281 10/12/2014   Lab Results  Component Value Date   CREATININE 0.60 10/12/2014   BUN 7 10/12/2014   NA 136 10/12/2014   K 3.8 10/12/2014   CL 105 10/12/2014   CO2 27 10/12/2014    Lab Results  Component Value Date   HGBA1C 8.9* 11/14/2014   Lipid Panel     Component Value Date/Time   CHOL 176 06/06/2010 2136   TRIG 106 06/06/2010 2136   HDL 40 06/06/2010 2136   CHOLHDL 4.4 Ratio 06/06/2010 2136   VLDL 21 06/06/2010 2136   LDLCALC 115* 06/06/2010 2136       Assessment and plan:   Erin [name] was seen today for establish care.  Diagnoses and all orders for this visit:  Colon cancer screening Orders: -     [facility] COLONOSCOPY  Annual physical exam Orders: -     MM Digital Screening; Future -     Cytology - PAP [place] -     Cervicovaginal ancillary only -     Hemoglobin A1c; Future -     Lipid panel; Future -     TSH; Future -     Vitamin D, 25-hydroxy; Future -     HIV antibody (with reflex); Future -     RPR; Future  Return in about 1 week (around 11/14/2014) for Lab Visit .  Due to language barrier, an interpreter was present during the history-taking and subsequent discussion (and for part of the physical exam) with this patient.   Chari Manning, NP-C [facility] [phone] 11/07/2014, 12:16 PM

## 2014-11-07 NOTE — Patient Instructions (Signed)

## 2014-11-08 LAB — CERVICOVAGINAL ANCILLARY ONLY
Chlamydia: NEGATIVE
Neisseria Gonorrhea: NEGATIVE
WET PREP (BD AFFIRM): NEGATIVE
Wet Prep (BD Affirm): NEGATIVE
Wet Prep (BD Affirm): POSITIVE — AB

## 2014-11-09 ENCOUNTER — Telehealth: Payer: Self-pay | Admitting: *Deleted

## 2014-11-09 LAB — CYTOLOGY - PAP

## 2014-11-09 MED ORDER — FLUCONAZOLE 150 MG PO TABS: 150.0000 mg | ORAL_TABLET | Freq: Every day | ORAL | Status: DC

## 2014-11-09 NOTE — Telephone Encounter (Signed)
-----   Message from Lance Bosch, NP sent at 11/08/2014  8:38 PM EST ----- Positive for yeast. Please send diflucan 150 mg to take PO once. Send 1 tablet with 1 refill. May repeat in one week if no improvement negative for STD's

## 2014-11-09 NOTE — Telephone Encounter (Signed)
Pt aware of results  Rx send to [facility]

## 2014-11-14 ENCOUNTER — Ambulatory Visit: Payer: Self-pay | Attending: Internal Medicine

## 2014-11-14 DIAGNOSIS — Z0000 Encounter for general adult medical examination without abnormal findings: Secondary | ICD-10-CM

## 2014-11-14 LAB — LIPID PANEL
CHOL/HDL RATIO: 4.3 ratio
Cholesterol: 152 mg/dL (ref 0–200)
HDL: 35 mg/dL — ABNORMAL LOW (ref >=46)
LDL Cholesterol: 96 mg/dL (ref 0–99)
TRIGLYCERIDES: 105 mg/dL (ref <150)
VLDL: 21 mg/dL (ref 0–40)

## 2014-11-14 LAB — HEMOGLOBIN A1C
HEMOGLOBIN A1C: 8.9 % — AB (ref <5.7)
Mean Plasma Glucose: 209 mg/dL — ABNORMAL HIGH (ref <117)

## 2014-11-14 LAB — TSH: TSH: 2.057 u[IU]/mL (ref 0.350–4.500)

## 2014-11-15 LAB — RPR

## 2014-11-15 LAB — HIV ANTIBODY (ROUTINE TESTING W REFLEX): HIV: NONREACTIVE

## 2014-11-15 LAB — VITAMIN D 25 HYDROXY (VIT D DEFICIENCY, FRACTURES): VIT D 25 HYDROXY: 39 ng/mL (ref 30–100)

## 2014-11-18 ENCOUNTER — Encounter: Payer: Self-pay | Admitting: Internal Medicine

## 2014-11-18 DIAGNOSIS — E119 Type 2 diabetes mellitus without complications: Secondary | ICD-10-CM | POA: Insufficient documentation

## 2014-11-19 ENCOUNTER — Other Ambulatory Visit: Payer: Self-pay | Admitting: Emergency Medicine

## 2014-11-19 ENCOUNTER — Telehealth: Payer: Self-pay | Admitting: Emergency Medicine

## 2014-11-19 ENCOUNTER — Telehealth: Payer: Self-pay | Admitting: Internal Medicine

## 2014-11-19 MED ORDER — METFORMIN HCL 500 MG PO TABS: 500.0000 mg | ORAL_TABLET | Freq: Two times a day (BID) | ORAL | Status: DC

## 2014-11-19 NOTE — Telephone Encounter (Signed)
Pt called back with sign language interpretor Given lab results with new diagnosis Diabetes Type 2 with medication instructions Pt instructed to start taking prescribed Metformin 500 mg tab BID with meals Pt given nurse appointment to go over new diagnosis with teaching on CBG machine/diet. Scheduled 12/07/14 @ 2pm

## 2014-11-19 NOTE — Telephone Encounter (Signed)
Attempted to reach pt with lab results; sign language interpretor unable to connect to the pt We will try again for technical issues

## 2014-11-19 NOTE — Telephone Encounter (Signed)
Patient is returning call from nurse. Please f/u with pt.

## 2014-11-19 NOTE — Telephone Encounter (Signed)
Patient called to request blood work results, patient also would like to get a med refill for her yeast infection. Please f/u with pt.

## 2014-11-20 ENCOUNTER — Telehealth: Payer: Self-pay | Admitting: Internal Medicine

## 2014-11-20 ENCOUNTER — Telehealth: Payer: Self-pay | Admitting: *Deleted

## 2014-11-20 NOTE — Telephone Encounter (Signed)
Pt called returning nurse's phone call, please f/u with pt  °

## 2014-11-20 NOTE — Telephone Encounter (Signed)
Used interpreter and left message informing the pt that her pap was normal and that we will repeat the pa in 3 years

## 2014-11-20 NOTE — Telephone Encounter (Signed)
-----   Message from Lance Bosch, NP sent at 11/18/2014  6:19 PM EST ----- Patient pap is negative for malignancies. Will repeat in 3 years.

## 2014-11-21 NOTE — Telephone Encounter (Signed)
Pt calling for lab results. Please f/u with pt.

## 2014-11-22 ENCOUNTER — Other Ambulatory Visit: Payer: Self-pay | Admitting: Internal Medicine

## 2014-11-22 DIAGNOSIS — Z1231 Encounter for screening mammogram for malignant neoplasm of breast: Secondary | ICD-10-CM

## 2014-12-05 ENCOUNTER — Ambulatory Visit: Payer: Self-pay

## 2014-12-07 ENCOUNTER — Other Ambulatory Visit: Payer: Self-pay

## 2014-12-10 ENCOUNTER — Ambulatory Visit
Admission: RE | Admit: 2014-12-10 | Discharge: 2014-12-10 | Disposition: A | Discharge status: Home / Self Care | Payer: No Typology Code available for payment source | Source: Ambulatory Visit | Attending: Internal Medicine | Admitting: Internal Medicine

## 2014-12-10 DIAGNOSIS — Z1231 Encounter for screening mammogram for malignant neoplasm of breast: Secondary | ICD-10-CM

## 2014-12-17 ENCOUNTER — Telehealth: Payer: Self-pay | Admitting: *Deleted

## 2014-12-17 ENCOUNTER — Ambulatory Visit: Payer: No Typology Code available for payment source | Attending: Internal Medicine | Admitting: *Deleted

## 2014-12-17 VITALS — BP 118/80 | HR 81 | Temp 98.0°F | Resp 18

## 2014-12-17 DIAGNOSIS — E1165 Type 2 diabetes mellitus with hyperglycemia: Secondary | ICD-10-CM

## 2014-12-17 LAB — POCT CBG (FASTING - GLUCOSE)-MANUAL ENTRY: Glucose Fasting, POC: 99 mg/dL (ref 70–99)

## 2014-12-17 MED ORDER — GLUCOSE BLOOD VI STRP: ORAL_STRIP | Status: DC

## 2014-12-17 MED ORDER — ACCU-CHEK SOFTCLIX LANCET DEV MISC: Status: DC

## 2014-12-17 MED ORDER — ACCU-CHEK AVIVA PLUS W/DEVICE KIT: PACK | Status: DC

## 2014-12-17 NOTE — Telephone Encounter (Signed)
-----   Message from Lance Bosch, NP sent at 12/10/2014  6:17 PM EDT ----- No evidence of malignancy on mammogram. Repeat in one year

## 2014-12-17 NOTE — Patient Instructions (Signed)
Diabetes Mellitus and Food It is important for you to manage your blood sugar (glucose) level. Your blood glucose level can be greatly affected by what you eat. Eating healthier foods in the appropriate amounts throughout the day at about the same time each day will help you control your blood glucose level. It can also help slow or prevent worsening of your diabetes mellitus. Healthy eating may even help you improve the level of your blood pressure and reach or maintain a healthy weight.  HOW CAN FOOD AFFECT ME? Carbohydrates Carbohydrates affect your blood glucose level more than any other type of food. Your dietitian will help you determine how many carbohydrates to eat at each meal and teach you how to count carbohydrates. Counting carbohydrates is important to keep your blood glucose at a healthy level, especially if you are using insulin or taking certain medicines for diabetes mellitus. Alcohol Alcohol can cause sudden decreases in blood glucose (hypoglycemia), especially if you use insulin or take certain medicines for diabetes mellitus. Hypoglycemia can be a life-threatening condition. Symptoms of hypoglycemia (sleepiness, dizziness, and disorientation) are similar to symptoms of having too much alcohol.  If your health care provider has given you approval to drink alcohol, do so in moderation and use the following guidelines:  Women should not have more than one drink per day, and men should not have more than two drinks per day. One drink is equal to:  12 oz of beer.  5 oz of wine.  1 oz of hard liquor.  Do not drink on an empty stomach.  Keep yourself hydrated. Have water, diet soda, or unsweetened iced tea.  Regular soda, juice, and other mixers might contain a lot of carbohydrates and should be counted. WHAT FOODS ARE NOT RECOMMENDED? As you make food choices, it is important to remember that all foods are not the same. Some foods have fewer nutrients per serving than other  foods, even though they might have the same number of calories or carbohydrates. It is difficult to get your body what it needs when you eat foods with fewer nutrients. Examples of foods that you should avoid that are high in calories and carbohydrates but low in nutrients include:  Trans fats (most processed foods list trans fats on the Nutrition Facts label).  Regular soda.  Juice.  Candy.  Sweets, such as cake, pie, doughnuts, and cookies.  Fried foods. WHAT FOODS CAN I EAT? Have nutrient-rich foods, which will nourish your body and keep you healthy. The food you should eat also will depend on several factors, including:  The calories you need.  The medicines you take.  Your weight.  Your blood glucose level.  Your blood pressure level.  Your cholesterol level. You also should eat a variety of foods, including:  Protein, such as meat, poultry, fish, tofu, nuts, and seeds (lean animal proteins are best).  Fruits.  Vegetables.  Dairy products, such as milk, cheese, and yogurt (low fat is best).  Breads, grains, pasta, cereal, rice, and beans.  Fats such as olive oil, trans fat-free margarine, canola oil, avocado, and olives. DOES EVERYONE WITH DIABETES MELLITUS HAVE THE SAME MEAL PLAN? Because every person with diabetes mellitus is different, there is not one meal plan that works for everyone. It is very important that you meet with a dietitian who will help you create a meal plan that is just right for you. Document Released: 05/28/2005 Document Revised: 09/05/2013 Document Reviewed: 07/28/2013 ExitCare Patient Information 2015 [employer]. This   information is not intended to replace advice given to you by your health care provider. Make sure you discuss any questions you have with your health care provider. Diabetes and Exercise Exercising regularly is important. It is not just about losing weight. It has many health benefits, such as:  Improving your overall  fitness, flexibility, and endurance.  Increasing your bone density.  Helping with weight control.  Decreasing your body fat.  Increasing your muscle strength.  Reducing stress and tension.  Improving your overall health. People with diabetes who exercise gain additional benefits because exercise:  Reduces appetite.  Improves the body's use of blood sugar (glucose).  Helps lower or control blood glucose.  Decreases blood pressure.  Helps control blood lipids (such as cholesterol and triglycerides).  Improves the body's use of the hormone insulin by:  Increasing the body's insulin sensitivity.  Reducing the body's insulin needs.  Decreases the risk for heart disease because exercising:  Lowers cholesterol and triglycerides levels.  Increases the levels of good cholesterol (such as high-density lipoproteins [HDL]) in the body.  Lowers blood glucose levels. YOUR ACTIVITY PLAN  Choose an activity that you enjoy and set realistic goals. Your health care provider or diabetes educator can help you make an activity plan that works for you. Exercise regularly as directed by your health care provider. This includes:  Performing resistance training twice a week such as push-ups, sit-ups, lifting weights, or using resistance bands.  Performing 150 minutes of cardio exercises each week such as walking, running, or playing sports.  Staying active and spending no more than 90 minutes at one time being inactive. Even short bursts of exercise are good for you. Three 10-minute sessions spread throughout the day are just as beneficial as a single 30-minute session. Some exercise ideas include:  Taking the dog for a walk.  Taking the stairs instead of the elevator.  Dancing to your favorite song.  Doing an exercise video.  Doing your favorite exercise with a friend. RECOMMENDATIONS FOR EXERCISING WITH TYPE 1 OR TYPE 2 DIABETES   Check your blood glucose before exercising. If  blood glucose levels are greater than 240 mg/dL, check for urine ketones. Do not exercise if ketones are present.  Avoid injecting insulin into areas of the body that are going to be exercised. For example, avoid injecting insulin into:  The arms when playing tennis.  The legs when jogging.  Keep a record of:  Food intake before and after you exercise.  Expected peak times of insulin action.  Blood glucose levels before and after you exercise.  The type and amount of exercise you have done.  Review your records with your health care provider. Your health care provider will help you to develop guidelines for adjusting food intake and insulin amounts before and after exercising.  If you take insulin or oral hypoglycemic agents, watch for signs and symptoms of hypoglycemia. They include:  Dizziness.  Shaking.  Sweating.  Chills.  Confusion.  Drink plenty of water while you exercise to prevent dehydration or heat stroke. Body water is lost during exercise and must be replaced.  Talk to your health care provider before starting an exercise program to make sure it is safe for you. Remember, almost any type of activity is better than none. Document Released: 11/21/2003 Document Revised: 01/15/2014 Document Reviewed: 02/07/2013 ExitCare Patient Information 2015 [employer]. This information is not intended to replace advice given to you by your health care provider. Make sure you discuss any   questions you have with your health care provider. Basic Carbohydrate Counting for Diabetes Mellitus Carbohydrate counting is a method for keeping track of the amount of carbohydrates you eat. Eating carbohydrates naturally increases the level of sugar (glucose) in your blood, so it is important for you to know the amount that is okay for you to have in every meal. Carbohydrate counting helps keep the level of glucose in your blood within normal limits. The amount of carbohydrates allowed is  different for every person. A dietitian can help you calculate the amount that is right for you. Once you know the amount of carbohydrates you can have, you can count the carbohydrates in the foods you want to eat. Carbohydrates are found in the following foods:  Grains, such as breads and cereals.  Dried beans and soy products.  Starchy vegetables, such as potatoes, peas, and corn.  Fruit and fruit juices.  Milk and yogurt.  Sweets and snack foods, such as cake, cookies, candy, chips, soft drinks, and fruit drinks. CARBOHYDRATE COUNTING There are two ways to count the carbohydrates in your food. You can use either of the methods or a combination of both. Reading the "Nutrition Facts" on Packaged Food The "Nutrition Facts" is an area that is included on the labels of almost all packaged food and beverages in the [place]. It includes the serving size of that food or beverage and information about the nutrients in each serving of the food, including the grams (g) of carbohydrate per serving.  Decide the number of servings of this food or beverage that you will be able to eat or drink. Multiply that number of servings by the number of grams of carbohydrate that is listed on the label for that serving. The total will be the amount of carbohydrates you will be having when you eat or drink this food or beverage. Learning Standard Serving Sizes of Food When you eat food that is not packaged or does not include "Nutrition Facts" on the label, you need to measure the servings in order to count the amount of carbohydrates.A serving of most carbohydrate-rich foods contains about 15 g of carbohydrates. The following list includes serving sizes of carbohydrate-rich foods that provide 15 g ofcarbohydrate per serving:   1 slice of bread (1 oz) or 1 six-inch tortilla.    of a hamburger bun or English muffin.  4-6 crackers.   cup unsweetened dry cereal.    cup hot cereal.   cup rice or  pasta.    cup mashed potatoes or  of a large baked potato.  1 cup fresh fruit or one small piece of fruit.    cup canned or frozen fruit or fruit juice.  1 cup milk.   cup plain fat-free yogurt or yogurt sweetened with artificial sweeteners.   cup cooked dried beans or starchy vegetable, such as peas, corn, or potatoes.  Decide the number of standard-size servings that you will eat. Multiply that number of servings by 15 (the grams of carbohydrates in that serving). For example, if you eat 2 cups of strawberries, you will have eaten 2 servings and 30 g of carbohydrates (2 servings x 15 g = 30 g). For foods such as soups and casseroles, in which more than one food is mixed in, you will need to count the carbohydrates in each food that is included. EXAMPLE OF CARBOHYDRATE COUNTING Sample Dinner  3 oz chicken breast.   cup of brown rice.   cup of corn.    1 cup milk.   1 cup strawberries with sugar-free whipped topping.  Carbohydrate Calculation Step 1: Identify the foods that contain carbohydrates:   Rice.   Corn.   Milk.   Strawberries. Step 2:Calculate the number of servings eaten of each:   2 servings of rice.   1 serving of corn.   1 serving of milk.   1 serving of strawberries. Step 3: Multiply each of those number of servings by 15 g:   2 servings of rice x 15 g = 30 g.   1 serving of corn x 15 g = 15 g.   1 serving of milk x 15 g = 15 g.   1 serving of strawberries x 15 g = 15 g. Step 4: Add together all of the amounts to find the total grams of carbohydrates eaten: 30 g + 15 g + 15 g + 15 g = 75 g. Document Released: 08/31/2005 Document Revised: 01/15/2014 Document Reviewed: 07/28/2013 ExitCare Patient Information 2015 [employer]. This information is not intended to replace advice given to you by your health care provider. Make sure you discuss any questions you have with your health care provider.  

## 2014-12-17 NOTE — Progress Notes (Signed)
Spoke with patient via [place], [place] Ser Patient presents for CBG and new onset T2DM education. Patient does not own glucometer; glucometer and supplies e-scribed to [facility] Patient given BS log and instructed on use. Instructed to bring to all future appts Med list reviewed; patient reports taking all meds as directed Patient walking 5 times around the block every other day plus going to [facility] for exercise States she has changed her diet to follow low carb and drinks at least 4-5 large glasses of water daily  Patient instructed on use of glucometer with return demonstration Fasting CBG 99  BP 118/80 P 81 T  98.0 oral SpO2 99%  Patient to Return in 2 weeks for nurse visit for BP check and CBG and record review  Patient advised to call for med refills at least 7 days before running out so as not to go without.  Patient given literature on Diabetes and Food, Diabetes and Exercise, Basic Carb Counting, The Plate Method and Living Well with Diabetes

## 2014-12-17 NOTE — Telephone Encounter (Signed)
Pt is aware of her MM results. 

## 2014-12-19 ENCOUNTER — Telehealth: Payer: Self-pay | Admitting: Internal Medicine

## 2014-12-19 NOTE — Telephone Encounter (Signed)
Pt is requesting a referral for a "trainer". Please f/u with pt.

## 2015-01-04 ENCOUNTER — Ambulatory Visit: Payer: No Typology Code available for payment source | Attending: Internal Medicine | Admitting: *Deleted

## 2015-01-04 VITALS — BP 125/74 | HR 89 | Temp 97.9°F | Resp 20

## 2015-01-04 DIAGNOSIS — E1165 Type 2 diabetes mellitus with hyperglycemia: Secondary | ICD-10-CM

## 2015-01-04 DIAGNOSIS — E119 Type 2 diabetes mellitus without complications: Secondary | ICD-10-CM | POA: Insufficient documentation

## 2015-01-04 NOTE — Progress Notes (Signed)
Spoke with patient via [place], [place] Patient presents for record review for T2DM Med list reviewed; patient reports taking metformin as directed Patient's AM fasting blood sugars ranging 84-123 ( 1 reading at 154) Patient's before dinner blood sugars ranging 77-117 (1 reading at 175 and 1 reading at 143)  Filed Vitals:   01/04/15 1122  BP: 125/74  Pulse: 89  Temp: 97.9 F (36.6 C)  Resp: 20    Patient aware that she is to f/u with PCP 3 months from last visit. Due 02/05/2015  Patient advised to call for med refills at least 7 days before running out so as not to go without.  Referral to Diabetic Nutrition and Education placed

## 2015-01-07 NOTE — Progress Notes (Signed)
I agree, no changes.

## 2015-01-29 ENCOUNTER — Encounter: Payer: No Typology Code available for payment source | Attending: Internal Medicine | Admitting: Dietician

## 2015-01-29 ENCOUNTER — Other Ambulatory Visit: Payer: Self-pay | Admitting: Internal Medicine

## 2015-01-29 ENCOUNTER — Encounter: Payer: Self-pay | Admitting: Dietician

## 2015-01-29 VITALS — Ht 61.0 in | Wt 169.0 lb

## 2015-01-29 DIAGNOSIS — E119 Type 2 diabetes mellitus without complications: Secondary | ICD-10-CM | POA: Insufficient documentation

## 2015-01-29 DIAGNOSIS — Z713 Dietary counseling and surveillance: Secondary | ICD-10-CM | POA: Insufficient documentation

## 2015-01-29 NOTE — Patient Instructions (Signed)
Good job on the changes that you have made! Portion control is very important. Limit sweets but if you choose them, count them as part of your carbohydrates allowed for your meal. Aim for 3 Carb Choices per meal (45 grams) +/- 1 either way  Aim for 0-1 Carbs per snack if hungry  Include protein in moderation with your meals and snacks Bake, broil or grill rather than fry.  Remove the skin from the chicken. Consider reading food labels for Total Carbohydrate and Fat Grams of foods Consider  increasing your activity level by walking or going to the [facility] for 30  minutes 5 days per week as tolerated Consider checking BG at alternate times per day as directed by MD  Consider taking medication as directed by MD

## 2015-01-29 NOTE — Progress Notes (Signed)
Diabetes Self-Management Education  Visit Type:    Appt. Start Time: 1030 Appt. End Time: 1200  01/29/2015  [name], identified by name and date of birth, is a 54 y.o. female with a diagnosis of Diabetes: Type 2.  Other people present during visit:  Patient, Interpreter Jacki Cones from [employer] and [facility].  Medical hx of deafness.  She lives with 22 year old daughter and does the shopping and cooking.  She does not work at this time to better care for daughter.  UBW 140 lbs about 3 years ago.    ASSESSMENT  Height 5\' 1"  (1.549 m), weight 169 lb (76.658 kg). Body mass index is 31.95 kg/(m^2).  Initial Visit Information:  Are you currently following a meal plan?: Yes What type of meal plan do you follow?: decreased sugar intake and portions Are you taking your medications as prescribed?: Yes Are you checking your feet?: Yes How many days per week are you checking your feet?: 7 How often do you need to have someone help you when you read instructions, pamphlets, or other written materials from your doctor or pharmacy?: 1 - Never What is the last grade level you completed in school?: Graduated from community college  Psychosocial:   Patient Belief/Attitude about Diabetes: Motivated to manage diabetes Self-care barriers: Other (comment) (deaf) Self-management support: Doctor's office, Friends Other persons present: Patient, Interpreter Patient Concerns: Nutrition/Meal planning, Healthy Lifestyle, Weight Control Special Needs: Verbal instruction (interpretor secondary to patient is deaf) Preferred Learning Style: Visual, Hands on Learning Readiness: Change in progress  Complications:   Last HgB A1C per patient/outside source: 8.9 mg/dL (11/14/14) How often do you check your blood sugar?: 1-2 times/day Fasting Blood glucose range (mg/dL): 130-179, 70-129 Postprandial Blood glucose range (mg/dL): 130-179 Number of hypoglycemic episodes per month:  0 Number of hyperglycemic episodes per week: 0 Have you had a dilated eye exam in the past 12 months?: No (states that she will get an appointment soon) Have you had a dental exam in the past 12 months?: No  Diet Intake:  Breakfast: cereal, 1 slice [employer] toast, or eggs and toast or fruit Snack (morning): fruit Lunch: sandwich was eating chips as well but increased blood sugar so stopped, and cooked veges or soup and fruit Snack (afternoon): fruit or crackers Dinner: chicken or chicken wings, cabbage, broccoli, corn or other starch and WW bread at times. Snack (evening): none Beverage(s): water, sprite zero, unsweetened tea, rare juice  Exercise:  Exercise: Light (walking / raking leaves) (states that she will be returning to the [facility] soon) Light Exercise amount of time (min / week): 75 (2-3 times per week for about 30 minutes)  Individualized Plan for Diabetes Self-Management Training:   Learning Objective:  Patient will have a greater understanding of diabetes self-management.  Patient education plan per assessed needs and concerns is to attend individual sessions for     Education Topics Reviewed with Patient Today:  Definition of diabetes, type 1 and 2, and the diagnosis of diabetes Role of diet in the treatment of diabetes and the relationship between the three main macronutrients and blood glucose level, Food label reading, portion sizes and measuring food., Carbohydrate counting, Reviewed blood glucose goals for pre and post meals and how to evaluate the patients' food intake on their blood glucose level., Meal options for control of blood glucose level and chronic complications. Role of exercise on diabetes management, blood pressure control and cardiac health.  Relationship between chronic complications and blood glucose control, Assessed and discussed foot care and prevention of foot problems, Identified and discussed with patient  current chronic complications,  Retinopathy and reason for yearly dilated eye exams, Dental care Role of stress on diabetes, Helped patient identify a support system for diabetes management     PATIENTS GOALS/Plan (Developed by the patient):  Nutrition: Follow meal plan discussed, General guidelines for healthy choices and portions discussed Physical Activity: Exercise 3-5 times per week Medications: take my medication as prescribed Monitoring : test my blood glucose as discussed (note x per day with comment)  Plan:   Patient Instructions  Good job on the changes that you have made! Portion control is very important. Limit sweets but if you choose them, count them as part of your carbohydrates allowed for your meal. Aim for 3 Carb Choices per meal (45 grams) +/- 1 either way  Aim for 0-1 Carbs per snack if hungry  Include protein in moderation with your meals and snacks Bake, broil or grill rather than fry.  Remove the skin from the chicken. Consider reading food labels for Total Carbohydrate and Fat Grams of foods Consider  increasing your activity level by walking or going to the [facility] for 30  minutes 5 days per week as tolerated Consider checking BG at alternate times per day as directed by MD  Consider taking medication as directed by MD    Expected Outcomes:  Demonstrated interest in learning. Expect positive outcomes  Education material provided: Food label handouts, A1C conversion sheet, Meal plan card, My Plate and Snack sheet  If problems or questions, patient to contact team via:  Email  Future DSME appointment: 2 months

## 2015-02-13 ENCOUNTER — Ambulatory Visit: Payer: No Typology Code available for payment source | Admitting: Internal Medicine

## 2015-02-18 ENCOUNTER — Ambulatory Visit: Payer: No Typology Code available for payment source | Attending: Internal Medicine | Admitting: Internal Medicine

## 2015-02-18 ENCOUNTER — Encounter: Payer: Self-pay | Admitting: Internal Medicine

## 2015-02-18 VITALS — BP 114/69 | HR 85 | Temp 97.6°F | Ht 61.0 in | Wt 168.0 lb

## 2015-02-18 DIAGNOSIS — Z23 Encounter for immunization: Secondary | ICD-10-CM

## 2015-02-18 DIAGNOSIS — E119 Type 2 diabetes mellitus without complications: Secondary | ICD-10-CM

## 2015-02-18 DIAGNOSIS — R0981 Nasal congestion: Secondary | ICD-10-CM

## 2015-02-18 DIAGNOSIS — Z1211 Encounter for screening for malignant neoplasm of colon: Secondary | ICD-10-CM

## 2015-02-18 LAB — POCT GLYCOSYLATED HEMOGLOBIN (HGB A1C): HEMOGLOBIN A1C: 6.5

## 2015-02-18 LAB — GLUCOSE, POCT (MANUAL RESULT ENTRY): POC Glucose: 80 mg/dl (ref 70–99)

## 2015-02-18 MED ORDER — FLUTICASONE PROPIONATE 50 MCG/ACT NA SUSP: 2.0000 | Freq: Every day | NASAL | Status: DC

## 2015-02-18 NOTE — Patient Instructions (Signed)
Diabetes and Standards of Medical Care Diabetes is complicated. You may find that your diabetes team includes a dietitian, nurse, diabetes educator, eye doctor, and more. To help everyone know what is going on and to help you get the care you deserve, the following schedule of care was developed to help keep you on track. Below are the tests, exams, vaccines, medicines, education, and plans you will need. HbA1c test This test shows how well you have controlled your glucose over the past 2-3 months. It is used to see if your diabetes management plan needs to be adjusted.   It is performed at least 2 times a year if you are meeting treatment goals.  It is performed 4 times a year if therapy has changed or if you are not meeting treatment goals. Blood pressure test  This test is performed at every routine medical visit. The goal is less than 140/90 mm Hg for most people, but 130/80 mm Hg in some cases. Ask your health care provider about your goal. Dental exam  Follow up with the dentist regularly. Eye exam  If you are diagnosed with type 1 diabetes as a child, get an exam upon reaching the age of 37 years or older and have had diabetes for 3-5 years. Yearly eye exams are recommended after that initial eye exam.  If you are diagnosed with type 1 diabetes as an adult, get an exam within 5 years of diagnosis and then yearly.  If you are diagnosed with type 2 diabetes, get an exam as soon as possible after the diagnosis and then yearly. Foot care exam  Visual foot exams are performed at every routine medical visit. The exams check for cuts, injuries, or other problems with the feet.  A comprehensive foot exam should be done yearly. This includes visual inspection as well as assessing foot pulses and testing for loss of sensation.  Check your feet nightly for cuts, injuries, or other problems with your feet. Tell your health care provider if anything is not healing. Kidney function test (urine  microalbumin)  This test is performed once a year.  Type 1 diabetes: The first test is performed 5 years after diagnosis.  Type 2 diabetes: The first test is performed at the time of diagnosis.  A serum creatinine and estimated glomerular filtration rate (eGFR) test is done once a year to assess the level of chronic kidney disease (CKD), if present. Lipid profile (cholesterol, HDL, LDL, triglycerides)  Performed every 5 years for most people.  The goal for LDL is less than 100 mg/dL. If you are at high risk, the goal is less than 70 mg/dL.  The goal for HDL is 40 mg/dL-50 mg/dL for men and 50 mg/dL-60 mg/dL for women. An HDL cholesterol of 60 mg/dL or higher gives some protection against heart disease.  The goal for triglycerides is less than 150 mg/dL. Influenza vaccine, pneumococcal vaccine, and hepatitis B vaccine  The influenza vaccine is recommended yearly.  It is recommended that people with diabetes who are over 24 years old get the pneumonia vaccine. In some cases, two separate shots may be given. Ask your health care provider if your pneumonia vaccination is up to date.  The hepatitis B vaccine is also recommended for adults with diabetes. Diabetes self-management education  Education is recommended at diagnosis and ongoing as needed. Treatment plan  Your treatment plan is reviewed at every medical visit. Document Released: 06/28/2009 Document Revised: 01/15/2014 Document Reviewed: 01/31/2013 [facility] Patient Information 2015 [place],  [employer]. This information is not intended to replace advice given to you by your health care provider. Make sure you discuss any questions you have with your health care provider.  

## 2015-02-18 NOTE — Progress Notes (Signed)
Patient ID: Erin [name], female   DOB: [birth year], 54 y.o.   MRN: [id number]   SUBJECTIVE: 54 y.o. female for follow up of diabetes. Diabetic Review of Systems - medication compliance: compliant all of the time, diabetic diet compliance: compliant all of the time, further diabetic ROS: no polyuria or polydipsia, no chest pain, dyspnea or TIA's, no numbness, tingling or pain in extremities, no hypoglycemia, last eye exam approximately over 1 year ago.  She has a eye appointment in July.  Patient checks sugars once daily with ranges in 75-133.   Current Outpatient Prescriptions  Medication Sig Dispense Refill  . Blood Glucose Monitoring Suppl (ACCU-CHEK AVIVA PLUS) W/DEVICE KIT Check Blood sugar AM fasting and before dinner 1 kit 0  . glucose blood (ACCU-CHEK AVIVA) test strip Use as instructed 100 each 12  . ibuprofen (ADVIL,MOTRIN) 600 MG tablet Take 1 tablet (600 mg total) by mouth every 6 (six) hours as needed. 30 tablet 0  . Lancet Devices (ACCU-CHEK SOFTCLIX) lancets Use as instructed 1 each 0  . metFORMIN (GLUCOPHAGE) 500 MG tablet Take 1 tablet (500 mg total) by mouth 2 (two) times daily with a meal. 180 tablet 3   No current facility-administered medications for this visit.    OBJECTIVE: Appearance: alert, well appearing, and in no distress and oriented to person, place, and time. BP 114/69 mmHg  Pulse 85  Temp(Src) 97.6 F (36.4 C)  Ht 5' 1"  (1.549 m)  Wt 168 lb (76.204 kg)  BMI 31.76 kg/m2  SpO2 100%  Exam: heart sounds normal rate, regular rhythm, normal S1, S2, no murmurs, rubs, clicks or gallops, chest clear, no carotid bruits  Krina was seen today for diabetes.  Diagnoses and all orders for this visit:  Type 2 diabetes mellitus without complication Orders: -     Glucose (CBG) -     HgB A1c -     Microalbumin, urine -     Basic Metabolic Panel ASSESSMENT: Diabetes Mellitus: improved PLAN: See orders for this visit as documented in the electronic medical  record. Issues reviewed with her: diabetic diet discussed in detail, written exchange diet given, low cholesterol diet, weight control and daily exercise discussed, annual eye examinations at Ophthalmology discussed and long term diabetic complications discussed.  Nasal congestion Orders: -     Begin fluticasone (FLONASE) 50 MCG/ACT nasal spray; Place 2 sprays into both nostrils daily.  Colon cancer screening Orders: -     HM COLONOSCOPY  Need for prophylactic vaccination against Streptococcus pneumoniae (pneumococcus) Orders: -     Pneumococcal polysaccharide vaccine 23-valent greater than or equal to 2yo subcutaneous/IM  Return in about 3 months (around 05/21/2015) for Diabetes Mellitus.   Due to language barrier, an sign language interpreter was present during the history-taking and subsequent discussion and for physical exam with this patient.   Chari Manning, NP 02/19/2015 2:02 PM

## 2015-02-18 NOTE — Progress Notes (Signed)
Patient here to follow up on her type 2 DM she checks her sugars fasting in the am and they range from 75-157 Patient reports no other problems and has prescription for metformin for 3 months

## 2015-02-19 LAB — BASIC METABOLIC PANEL
BUN: 9 mg/dL (ref 6–23)
CALCIUM: 9.4 mg/dL (ref 8.4–10.5)
CHLORIDE: 102 meq/L (ref 96–112)
CO2: 26 meq/L (ref 19–32)
CREATININE: 0.57 mg/dL (ref 0.50–1.10)
GLUCOSE: 79 mg/dL (ref 70–99)
POTASSIUM: 4.4 meq/L (ref 3.5–5.3)
Sodium: 136 mEq/L (ref 135–145)

## 2015-02-19 LAB — MICROALBUMIN, URINE: Microalb, Ur: 0.3 mg/dL (ref <2.0)

## 2015-03-11 ENCOUNTER — Telehealth: Payer: Self-pay

## 2015-03-11 NOTE — Telephone Encounter (Signed)
-----   Message from Lance Bosch, NP sent at 02/22/2015 11:10 AM EDT ----- Labs are normal

## 2015-03-11 NOTE — Telephone Encounter (Signed)
Nurse called patient via sign language interpreter supplied by patients home number,reached voicemail. Left message for patient to call Karaline Buresh with [facility], at [phone].

## 2015-03-11 NOTE — Telephone Encounter (Signed)
Patient called nurse via sign language interpreter [phone], patient verified date of birth. Patient aware of normal labs. Patient voices understanding and has no further questions at this time.

## 2015-04-02 ENCOUNTER — Encounter: Payer: No Typology Code available for payment source | Attending: Internal Medicine | Admitting: Dietician

## 2015-04-02 DIAGNOSIS — Z713 Dietary counseling and surveillance: Secondary | ICD-10-CM | POA: Insufficient documentation

## 2015-04-02 DIAGNOSIS — E119 Type 2 diabetes mellitus without complications: Secondary | ICD-10-CM | POA: Insufficient documentation

## 2015-04-15 ENCOUNTER — Ambulatory Visit: Payer: No Typology Code available for payment source | Admitting: Dietician

## 2015-10-04 MED FILL — metFORMIN HCL 500 MG TABS: 500 | 90 days supply | Qty: 180 | Fill #5

## 2016-01-17 ENCOUNTER — Other Ambulatory Visit: Payer: Self-pay

## 2016-01-17 ENCOUNTER — Other Ambulatory Visit: Payer: Self-pay | Admitting: Internal Medicine

## 2016-01-17 DIAGNOSIS — Z1231 Encounter for screening mammogram for malignant neoplasm of breast: Secondary | ICD-10-CM

## 2016-01-17 MED FILL — metFORMIN HCL 500 MG TABS: 500 | 30 days supply | Qty: 60 | Fill #0

## 2016-01-24 ENCOUNTER — Ambulatory Visit: Payer: Self-pay

## 2016-02-03 ENCOUNTER — Ambulatory Visit: Payer: Self-pay

## 2016-06-19 ENCOUNTER — Telehealth: Payer: Self-pay | Admitting: Internal Medicine

## 2016-06-19 NOTE — Telephone Encounter (Signed)
Call transferred from register rep [place].   Patient called using relay call, rep "Ray" ID: [id number], patient asked what otc meds she can take for a cold.  RN advised nasal saline spray/coricidin if appropriate for her symptoms.  Also suggested speaking with her pharmacist.  Patient verbalized understanding.

## 2016-06-19 NOTE — Telephone Encounter (Signed)
error 

## 2016-07-01 ENCOUNTER — Ambulatory Visit: Payer: Self-pay | Attending: Family Medicine | Admitting: Family Medicine

## 2016-07-01 ENCOUNTER — Encounter: Payer: Self-pay | Admitting: Family Medicine

## 2016-07-01 VITALS — BP 125/80 | HR 86 | Temp 98.2°F | Ht 61.0 in | Wt 162.2 lb

## 2016-07-01 DIAGNOSIS — M545 Low back pain, unspecified: Secondary | ICD-10-CM

## 2016-07-01 DIAGNOSIS — Z79899 Other long term (current) drug therapy: Secondary | ICD-10-CM | POA: Insufficient documentation

## 2016-07-01 DIAGNOSIS — G8929 Other chronic pain: Secondary | ICD-10-CM

## 2016-07-01 DIAGNOSIS — E119 Type 2 diabetes mellitus without complications: Secondary | ICD-10-CM | POA: Insufficient documentation

## 2016-07-01 DIAGNOSIS — Z23 Encounter for immunization: Secondary | ICD-10-CM | POA: Insufficient documentation

## 2016-07-01 DIAGNOSIS — Z7984 Long term (current) use of oral hypoglycemic drugs: Secondary | ICD-10-CM | POA: Insufficient documentation

## 2016-07-01 LAB — POCT GLYCOSYLATED HEMOGLOBIN (HGB A1C): HEMOGLOBIN A1C: 6.6

## 2016-07-01 LAB — GLUCOSE, POCT (MANUAL RESULT ENTRY): POC GLUCOSE: 86 mg/dL (ref 70–99)

## 2016-07-01 MED ORDER — CYCLOBENZAPRINE HCL 10 MG PO TABS: 10.0000 mg | ORAL_TABLET | Freq: Every evening | ORAL | 2 refills | Status: DC | PRN

## 2016-07-01 MED ORDER — MELOXICAM 7.5 MG PO TABS: 7.5000 mg | ORAL_TABLET | Freq: Every day | ORAL | 2 refills | Status: DC | PRN

## 2016-07-01 MED ORDER — LUMBAR BACK BRACE/SUPPORT PAD MISC: 1.0000 | Freq: Every day | 0 refills | Status: DC

## 2016-07-01 MED ORDER — METFORMIN HCL 500 MG PO TABS: 500.0000 mg | ORAL_TABLET | Freq: Two times a day (BID) | ORAL | 11 refills | Status: DC

## 2016-07-01 MED FILL — ?MELOXICAM 7.5 MG TABLET: 7.5 | 30 days supply | Qty: 30 | Fill #0

## 2016-07-01 MED FILL — ?CYCLOBENZAPRINE 10 MG TABL: 10 | 30 days supply | Qty: 30 | Fill #0

## 2016-07-01 MED FILL — metFORMIN HCL 500 MG TABS: 500 | 30 days supply | Qty: 60 | Fill #0

## 2016-07-01 NOTE — Progress Notes (Signed)
Pt is getting flu shot today. 

## 2016-07-01 NOTE — Progress Notes (Signed)
Subjective:  Patient ID: Erin [name], female    DOB: Nov 30, 1960  Age: 55 y.o. MRN: [id number]  CC: Diabetes   HPI Erin [name] presents for   1. CHRONIC DIABETES  Disease Monitoring  Blood Sugar Ranges: not checking   Polyuria: no   Visual problems: no   Medication Compliance: yes  Medication Side Effects  Hypoglycemia: yes   Preventitive Health Care  Eye Exam: due   Foot Exam: done today     2. Chronic low back pain: x 9 months about. 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 She has R sided low back pain. Non radiating. Sharp pain in back when rising for sitting or lying down and when bending forward. Her pain interferes with her sleep. She reports that she bends often and work and does heavy lifting. No fecal or urinary incontinence. No fever, chills, weight loss. No rash. She takes ibuprofen as needed for pain which helps temporarily.    Social History  Substance Use Topics  . Smoking status: Never Smoker  . Smokeless tobacco: Not on file  . Alcohol use No    Outpatient Medications Prior to Visit  Medication Sig Dispense Refill  . Blood Glucose Monitoring Suppl (ACCU-CHEK AVIVA PLUS) W/DEVICE KIT Check Blood sugar AM fasting and before dinner 1 kit 0  . fluticasone (FLONASE) 50 MCG/ACT nasal spray Place 2 sprays into both nostrils daily. 16 g 6  . glucose blood (ACCU-CHEK AVIVA) test strip Use as instructed 100 each 12  . ibuprofen (ADVIL,MOTRIN) 600 MG tablet Take 1 tablet (600 mg total) by mouth every 6 (six) hours as needed. 30 tablet 0  . Lancet Devices (ACCU-CHEK SOFTCLIX) lancets Use as instructed 1 each 0  . metFORMIN (GLUCOPHAGE) 500 MG tablet Take 1 tablet (500 mg total) by mouth 2 (two) times daily with a meal. Must have office visit for refills 60 tablet 0  . TRUEPLUS LANCETS 28G MISC USE AS DIRECTED BY PHYSICIAN 100 each 0   No facility-administered medications prior to visit.     ROS Review of Systems  Constitutional: Positive for  fatigue. Negative for chills and fever.  Eyes: Negative for visual disturbance.  Respiratory: Negative for shortness of breath.   Cardiovascular: Negative for chest pain.  Gastrointestinal: Negative for abdominal pain and blood in stool.  Musculoskeletal: Positive for back pain. Negative for arthralgias.  Skin: Negative for rash.  Allergic/Immunologic: Negative for immunocompromised state.  Hematological: Negative for adenopathy. Does not bruise/bleed easily.  Psychiatric/Behavioral: Positive for sleep disturbance. Negative for dysphoric mood and suicidal ideas.    Objective:  BP 125/80 (BP Location: Right Arm, Patient Position: Sitting, Cuff Size: Small)   Pulse 86   Temp 98.2 F (36.8 C) (Oral)   Ht 5' 1"  (1.549 m)   Wt 162 lb 3.2 oz (73.6 kg)   LMP 06/27/2016   SpO2 100%   BMI 30.65 kg/m   BP/Weight 07/01/2016 02/18/2015 03/11/6380  Systolic BP 771 165 -  Diastolic BP 80 69 -  Wt. (Lbs) 162.2 168 169  BMI 30.65 31.76 31.95    Physical Exam  Constitutional: She is oriented to person, place, and time. She appears well-developed and well-nourished. No distress.  HENT:  Head: Normocephalic and atraumatic.  Cardiovascular: Normal rate, regular rhythm, normal heart sounds and intact distal pulses.   Pulmonary/Chest: Effort normal and breath sounds normal.  Musculoskeletal: She exhibits no edema.  Back Exam: Back: Normal Curvature, no deformities or CVA tenderness  Paraspinal Tenderness: R lumbar  LE Strength 5/5  LE Sensation: in tact  LE Reflexes 2+ and symmetric  Straight leg raise: negative    Neurological: She is alert and oriented to person, place, and time.  Skin: Skin is warm and dry. No rash noted.  Psychiatric: She has a normal mood and affect.   Lab Results  Component Value Date   HGBA1C 6.50 02/18/2015   Lab Results  Component Value Date   HGBA1C 6.6 07/01/2016    CBG 86  Depression screen [facility] 2/9 07/01/2016 02/18/2015 01/29/2015  Decreased Interest 0  0 0  Down, Depressed, Hopeless 0 0 0  PHQ - 2 Score 0 0 0  Altered sleeping 2 - -  Tired, decreased energy 2 - -  Change in appetite 1 - -  Feeling bad or failure about yourself  0 - -  Trouble concentrating 0 - -  Moving slowly or fidgety/restless 0 - -  Suicidal thoughts 0 - -  PHQ-9 Score 5 - -   GAD 7 : Generalized Anxiety Score 07/01/2016  Nervous, Anxious, on Edge 0  Control/stop worrying 0  Worry too much - different things 0  Trouble relaxing 0  Restless 0  Easily annoyed or irritable 0  Afraid - awful might happen 0  Total GAD 7 Score 0     Assessment & Plan:   [name] was seen today for diabetes.  Diagnoses and all orders for this visit:  Type 2 diabetes mellitus without complication, without long-term current use of insulin (HCC) -     POCT glucose (manual entry) -     POCT glycosylated hemoglobin (Hb A1C) -     Cancel: Microalbumin/Creatinine Ratio, Urine -     Microalbumin/Creatinine Ratio, Urine; Future -     Lipid Panel; Future -     COMPLETE METABOLIC PANEL WITH GFR; Future -     metFORMIN (GLUCOPHAGE) 500 MG tablet; Take 1 tablet (500 mg total) by mouth 2 (two) times daily with a meal.  Chronic right-sided low back pain without sciatica -     Elastic Bandages & Supports (LUMBAR BACK BRACE/SUPPORT PAD) MISC; 1 each by Does not apply route daily.  Other orders -     Flu Vaccine QUAD 36+ mos IM -     meloxicam (MOBIC) 7.5 MG tablet; Take 1 tablet (7.5 mg total) by mouth daily as needed for pain. With food -     cyclobenzaprine (FLEXERIL) 10 MG tablet; Take 1 tablet (10 mg total) by mouth at bedtime as needed for muscle spasms.    No orders of the defined types were placed in this encounter.   Follow-up: Return in about 3 months (around 10/01/2016) for low back pain .   Boykin Nearing MD

## 2016-07-01 NOTE — Patient Instructions (Addendum)
Erin [name] was seen today for diabetes.  Diagnoses and all orders for this visit:  Type 2 diabetes mellitus without complication, without long-term current use of insulin (HCC) -     POCT glucose (manual entry) -     POCT glycosylated hemoglobin (Hb A1C) -     Cancel: Microalbumin/Creatinine Ratio, Urine -     Microalbumin/Creatinine Ratio, Urine; Future -     Lipid Panel; Future -     COMPLETE METABOLIC PANEL WITH GFR; Future -     metFORMIN (GLUCOPHAGE) 500 MG tablet; Take 1 tablet (500 mg total) by mouth 2 (two) times daily with a meal.  Chronic right-sided low back pain without sciatica -     Elastic Bandages & Supports (LUMBAR BACK BRACE/SUPPORT PAD) MISC; 1 each by Does not apply route daily.  Other orders -     Flu Vaccine QUAD 36+ mos IM -     meloxicam (MOBIC) 7.5 MG tablet; Take 1 tablet (7.5 mg total) by mouth daily as needed for pain. With food -     cyclobenzaprine (FLEXERIL) 10 MG tablet; Take 1 tablet (10 mg total) by mouth at bedtime as needed for muscle spasms.   Please apply for [facility] discount and orange card, you can also inquire if any of your medications are on the PASS (medications assistance) list.   Also apply for lab assistance with [facility]  F/u for labs, order placed  F/u in 2-3 months for low back pain   Dr. Adrian Blackwater   Back Exercises If you have pain in your back, do these exercises 2-3 times each day or as told by your doctor. When the pain goes away, do the exercises once each day, but repeat the steps more times for each exercise (do more repetitions). If you do not have pain in your back, do these exercises once each day or as told by your doctor. EXERCISES Single Knee to Chest Do these steps 3-5 times in a row for each leg: 1. Lie on your back on a firm bed or the floor with your legs stretched out. 2. Bring one knee to your chest. 3. Hold your knee to your chest by grabbing your knee or thigh. 4. Pull on your knee until you feel a gentle  stretch in your lower back. 5. Keep doing the stretch for 10-30 seconds. 6. Slowly let go of your leg and straighten it. Pelvic Tilt Do these steps 5-10 times in a row: 1. Lie on your back on a firm bed or the floor with your legs stretched out. 2. Bend your knees so they point up to the ceiling. Your feet should be flat on the floor. 3. Tighten your lower belly (abdomen) muscles to press your lower back against the floor. This will make your tailbone point up to the ceiling instead of pointing down to your feet or the floor. 4. Stay in this position for 5-10 seconds while you gently tighten your muscles and breathe evenly. Cat-Cow Do these steps until your lower back bends more easily: 1. Get on your hands and knees on a firm surface. Keep your hands under your shoulders, and keep your knees under your hips. You may put padding under your knees. 2. Let your head hang down, and make your tailbone point down to the floor so your lower back is round like the back of a cat. 3. Stay in this position for 5 seconds. 4. Slowly lift your head and make your tailbone point up to  the ceiling so your back hangs low (sags) like the back of a cow. 5. Stay in this position for 5 seconds. Press-Ups Do these steps 5-10 times in a row: 1. Lie on your belly (face-down) on the floor. 2. Place your hands near your head, about shoulder-width apart. 3. While you keep your back relaxed and keep your hips on the floor, slowly straighten your arms to raise the top half of your body and lift your shoulders. Do not use your back muscles. To make yourself more comfortable, you may change where you place your hands. 4. Stay in this position for 5 seconds. 5. Slowly return to lying flat on the floor. Bridges Do these steps 10 times in a row: 1. Lie on your back on a firm surface. 2. Bend your knees so they point up to the ceiling. Your feet should be flat on the floor. 3. Tighten your butt muscles and lift your butt off  of the floor until your waist is almost as high as your knees. If you do not feel the muscles working in your butt and the back of your thighs, slide your feet 1-2 inches farther away from your butt. 4. Stay in this position for 3-5 seconds. 5. Slowly lower your butt to the floor, and let your butt muscles relax. If this exercise is too easy, try doing it with your arms crossed over your chest. Belly Crunches Do these steps 5-10 times in a row: 1. Lie on your back on a firm bed or the floor with your legs stretched out. 2. Bend your knees so they point up to the ceiling. Your feet should be flat on the floor. 3. Cross your arms over your chest. 4. Tip your chin a little bit toward your chest but do not bend your neck. 5. Tighten your belly muscles and slowly raise your chest just enough to lift your shoulder blades a tiny bit off of the floor. 6. Slowly lower your chest and your head to the floor. Back Lifts Do these steps 5-10 times in a row: 1. Lie on your belly (face-down) with your arms at your sides, and rest your forehead on the floor. 2. Tighten the muscles in your legs and your butt. 3. Slowly lift your chest off of the floor while you keep your hips on the floor. Keep the back of your head in line with the curve in your back. Look at the floor while you do this. 4. Stay in this position for 3-5 seconds. 5. Slowly lower your chest and your face to the floor. GET HELP IF:  Your back pain gets a lot worse when you do an exercise.  Your back pain does not lessen 2 hours after you exercise. If you have any of these problems, stop doing the exercises. Do not do them again unless your doctor says it is okay. GET HELP RIGHT AWAY IF:  You have sudden, very bad back pain. If this happens, stop doing the exercises. Do not do them again unless your doctor says it is okay.   This information is not intended to replace advice given to you by your health care provider. Make sure you discuss  any questions you have with your health care provider.   Document Released: 10/03/2010 Document Revised: 05/22/2015 Document Reviewed: 10/25/2014 Elsevier Interactive Patient Education [employer].

## 2016-07-02 DIAGNOSIS — G8929 Other chronic pain: Secondary | ICD-10-CM | POA: Insufficient documentation

## 2016-07-02 DIAGNOSIS — M545 Low back pain, unspecified: Secondary | ICD-10-CM | POA: Insufficient documentation

## 2016-07-02 NOTE — Assessment & Plan Note (Signed)
Well controlled type 2 diabetes Continue current regimen Patient uninsured and applying for discounts Future order for labs placed Advised yearly opthalmology visit, will refer once she has the orange card

## 2016-07-02 NOTE — Assessment & Plan Note (Signed)
Chronic R sided low back pain without radicular symptoms or bony tenderness.  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

## 2016-07-17 ENCOUNTER — Telehealth: Payer: Self-pay | Admitting: Family Medicine

## 2016-07-17 DIAGNOSIS — M545 Low back pain, unspecified: Secondary | ICD-10-CM

## 2016-07-17 DIAGNOSIS — G8929 Other chronic pain: Secondary | ICD-10-CM

## 2016-07-17 MED ORDER — METHYLPREDNISOLONE 4 MG PO TBPK: ORAL_TABLET | ORAL | 0 refills | Status: DC

## 2016-07-17 MED FILL — ?METHYLPREDNISOLONE 4 MG TA: 4 | 7 days supply | Qty: 21 | Fill #0

## 2016-07-17 NOTE — Telephone Encounter (Signed)
Patient advised to stop flexeril due to sedation Also stop mobic and replace with depo medrol course  This is a strong steroid antiinflammatory It will increase blood sugar so she must be sure to take metformin and eat low sugar while on depo medrol

## 2016-07-17 NOTE — Telephone Encounter (Signed)
Patient states she has not been able to go to work due to back pain and medication making her very sleepy...  Patient called to schedule appt., first available appt. Is not until next Friday and she states she needs something sooner. She hasn't been able to work.  Please advised patient

## 2016-07-20 ENCOUNTER — Encounter (HOSPITAL_COMMUNITY): Payer: Self-pay | Admitting: Emergency Medicine

## 2016-07-20 ENCOUNTER — Ambulatory Visit (INDEPENDENT_AMBULATORY_CARE_PROVIDER_SITE_OTHER): Payer: Medicare Other

## 2016-07-20 ENCOUNTER — Ambulatory Visit (HOSPITAL_COMMUNITY)
Admission: EM | Admit: 2016-07-20 | Discharge: 2016-07-20 | Disposition: A | Discharge status: Home / Self Care | Payer: Self-pay | Attending: Family Medicine | Admitting: Family Medicine

## 2016-07-20 DIAGNOSIS — M5440 Lumbago with sciatica, unspecified side: Secondary | ICD-10-CM | POA: Diagnosis not present

## 2016-07-20 MED ORDER — DICLOFENAC POTASSIUM 50 MG PO TABS: 50.0000 mg | ORAL_TABLET | Freq: Three times a day (TID) | ORAL | 0 refills | Status: DC

## 2016-07-20 NOTE — ED Provider Notes (Signed)
[place]    CSN: [id number] Arrival date & time: 07/20/16  1041     History   Chief Complaint No chief complaint on file.   HPI Erin [name] is a 55 y.o. female.   The history is provided by the patient. The history is limited by a language barrier. A language interpreter was used.  Back Pain  Location:  Lumbar spine Quality:  Stiffness Pain severity:  Moderate Onset quality:  Gradual Duration:  12 months Chronicity:  Chronic Context: lifting heavy objects   Context comment:  Lot of back stress otj, with bending and  Associated symptoms: no abdominal pain, no bladder incontinence, no dysuria, no fever, no leg pain, no paresthesias and no weakness   Risk factors comment:  NKI as cause, has not seen ortho.   Past Medical History:  Diagnosis Date  . Diabetes mellitus without complication [facility])     Patient Active Problem List   Diagnosis Date Noted  . Chronic right-sided low back pain without sciatica 07/02/2016  . DM type 2 (diabetes mellitus, type 2) ([place]) 11/18/2014  . GASTRITIS 06/06/2010  . UNSPECIFIED IRON DEFICIENCY ANEMIA 04/22/2010  . DEPRESSION 04/10/2010  . DEAFNESS 04/10/2010    Past Surgical History:  Procedure Laterality Date  . TUBAL LIGATION      OB History    No data available       Home Medications    Prior to Admission medications   Medication Sig Start Date End Date Taking? Authorizing Provider  Blood Glucose Monitoring Suppl (ACCU-CHEK AVIVA PLUS) W/DEVICE KIT Check Blood sugar AM fasting and before dinner 12/17/14   Lance Bosch, NP  cyclobenzaprine (FLEXERIL) 10 MG tablet Take 1 tablet (10 mg total) by mouth at bedtime as needed for muscle spasms. 07/01/16   Josalyn Funches, MD  diclofenac (CATAFLAM) 50 MG tablet Take 1 tablet (50 mg total) by mouth 3 (three) times daily. 07/20/16   Billy Fischer, MD  Elastic Bandages & Supports (LUMBAR BACK BRACE/SUPPORT PAD) MISC 1 each by Does not apply route daily. 07/01/16    Josalyn Funches, MD  fluticasone (FLONASE) 50 MCG/ACT nasal spray Place 2 sprays into both nostrils daily. 02/18/15   Lance Bosch, NP  glucose blood (ACCU-CHEK AVIVA) test strip Use as instructed 12/17/14   Lance Bosch, NP  Lancet Devices [facility]) lancets Use as instructed 12/17/14   Lance Bosch, NP  meloxicam (MOBIC) 7.5 MG tablet Take 1 tablet (7.5 mg total) by mouth daily as needed for pain. With food 07/01/16   Boykin Nearing, MD  metFORMIN (GLUCOPHAGE) 500 MG tablet Take 1 tablet (500 mg total) by mouth 2 (two) times daily with a meal. 07/01/16   Boykin Nearing, MD  methylPREDNISolone (MEDROL DOSEPAK) 4 MG TBPK tablet Taper per packet insert 07/17/16   Boykin Nearing, MD  TRUEPLUS LANCETS 28G MISC USE AS DIRECTED BY PHYSICIAN 03/13/15   Lance Bosch, NP    Family History Family History  Problem Relation Age of Onset  . Hypertension Mother   . Diabetes Father   . Hypertension Father   . Hypertension Sister   . Cancer Paternal Grandmother   . Diabetes Paternal Grandmother     Social History Social History  Substance Use Topics  . Smoking status: Never Smoker  . Smokeless tobacco: Not on file  . Alcohol use No     Allergies   Patient has no known allergies.   Review of Systems Review of Systems  Constitutional: Negative.  Negative for fever.  Gastrointestinal: Negative.  Negative for abdominal pain.  Genitourinary: Negative.  Negative for bladder incontinence and dysuria.  Musculoskeletal: Positive for back pain. Negative for gait problem and joint swelling.  Skin: Negative.   Neurological: Negative.  Negative for weakness and paresthesias.  All other systems reviewed and are negative.    Physical Exam Triage Vital Signs ED Triage Vitals  Enc Vitals Group     BP 07/20/16 1228 143/77     Pulse Rate 07/20/16 1228 79     Resp 07/20/16 1228 20     Temp 07/20/16 1228 97.7 F (36.5 C)     Temp Source 07/20/16 1228 Oral     SpO2 07/20/16 1228 100 %      Weight --      Height --      Head Circumference --      Peak Flow --      Pain Score 07/20/16 1227 9     Pain Loc --      Pain Edu? --      Excl. in [place]? --    No data found.   Updated Vital Signs BP 143/77 (BP Location: Right Arm)   Pulse 79   Temp 97.7 F (36.5 C) (Oral)   Resp 20   LMP 06/27/2016   SpO2 100%   Visual Acuity Right Eye Distance:   Left Eye Distance:   Bilateral Distance:    Right Eye Near:   Left Eye Near:    Bilateral Near:     Physical Exam  Constitutional: She is oriented to person, place, and time. She appears well-developed and well-nourished. No distress.  Musculoskeletal: She exhibits tenderness.       Lumbar back: She exhibits tenderness, pain and spasm. She exhibits no deformity and normal pulse.  Neurological: She is alert and oriented to person, place, and time. She displays normal reflexes. No cranial nerve deficit. Coordination normal.  Skin: Skin is warm and dry.  Nursing note and vitals reviewed.    UC Treatments / Results  Labs (all labs ordered are listed, but only abnormal results are displayed) Labs Reviewed - No data to display  EKG  EKG Interpretation None       Radiology Dg Lumbar Spine Complete  Result Date: 07/20/2016 CLINICAL DATA:  55 year old female with a history of lower lumbar back pain for 1 month EXAM: LUMBAR SPINE - COMPLETE 4+ VIEW COMPARISON:  CT 12/22/2005 FINDINGS: Lumbar Spine: Lumbar vertebral elements maintain normal alignment without evidence of anterolisthesis, retrolisthesis, subluxation. No fracture line identified. Vertebral body heights maintained. Disc space narrowing at L5-S1. Facet hypertrophy at L4-L5 and L5-S1. Oblique images demonstrate no displaced pars defect. IMPRESSION: No radiographic evidence acute fracture or malalignment of the lumbar spine. Early facet changes of L4-L5 and L5-S1. Signed, Dulcy Fanny. Earleen Newport, DO [facility]  Electronically Signed   By: Corrie Mckusick D.O.   On: 07/20/2016 13:25   X-rays reviewed and report per radiologist.  Procedures Procedures (including critical care time)  Medications Ordered in UC Medications - No data to display   Initial Impression / Assessment and Plan / UC Course  I have reviewed the triage vital signs and the nursing notes.  Pertinent labs & imaging results that were available during my care of the patient were reviewed by me and considered in my medical decision making (see chart for details).  Clinical Course      Final Clinical Impressions(s) /  UC Diagnoses   Final diagnoses:  Acute right-sided low back pain with sciatica, sciatica laterality unspecified    New Prescriptions New Prescriptions   DICLOFENAC (CATAFLAM) 50 MG TABLET    Take 1 tablet (50 mg total) by mouth 3 (three) times daily.     Billy Fischer, MD 07/20/16 [phone]

## 2016-07-20 NOTE — Discharge Instructions (Signed)
Use medicine and see dr Ronnie Derby for follow up

## 2016-07-20 NOTE — ED Triage Notes (Signed)
Lower back pain on right side.  Reports a lot of difficulty moving around.  Described as tightness and pressure.  No specific injury.

## 2016-07-22 NOTE — Telephone Encounter (Signed)
Pt speaks sign language

## 2016-08-05 ENCOUNTER — Ambulatory Visit: Payer: Self-pay | Attending: Internal Medicine

## 2016-08-20 MED FILL — metFORMIN HCL 500 MG TABS: 500 | 30 days supply | Qty: 60 | Fill #1

## 2016-08-27 ENCOUNTER — Ambulatory Visit: Payer: Self-pay

## 2016-09-15 ENCOUNTER — Ambulatory Visit: Payer: Self-pay | Attending: Family Medicine

## 2016-09-15 ENCOUNTER — Telehealth: Payer: Self-pay | Admitting: Family Medicine

## 2016-09-15 DIAGNOSIS — E119 Type 2 diabetes mellitus without complications: Secondary | ICD-10-CM

## 2016-09-15 MED ORDER — METFORMIN HCL ER 500 MG PO TB24: 1000.0000 mg | ORAL_TABLET | Freq: Every day | ORAL | 5 refills | Status: DC

## 2016-09-15 NOTE — Telephone Encounter (Signed)
Will route to PCP 

## 2016-09-15 NOTE — Telephone Encounter (Signed)
Please call back to patient Changed metformin to 500 mg XR, she is advised to take two pills once daily

## 2016-09-15 NOTE — Telephone Encounter (Signed)
Pt. Came into facility requesting to have her metformin changed. Pt. Would only like to take one pill a day not two. Pt. States she forgets to take her pill in the morning. Please f/u with pt.

## 2016-09-16 NOTE — Telephone Encounter (Signed)
Pt was called and a VM was left informing pt to return phone call. 

## 2016-10-19 MED FILL — metFORMIN HCL 500 MG TABS: 500 | 30 days supply | Qty: 60 | Fill #2

## 2016-11-30 MED FILL — ?METFORMIN HCL 500MG TABLET: 500 | 30 days supply | Qty: 60 | Fill #3

## 2016-12-25 ENCOUNTER — Ambulatory Visit: Payer: Self-pay

## 2016-12-29 ENCOUNTER — Ambulatory Visit: Payer: Self-pay | Attending: Family Medicine | Admitting: Physician Assistant

## 2016-12-29 VITALS — BP 132/87 | HR 80 | Temp 98.3°F | Resp 16 | Wt 169.0 lb

## 2016-12-29 DIAGNOSIS — M545 Low back pain, unspecified: Secondary | ICD-10-CM

## 2016-12-29 DIAGNOSIS — Z7984 Long term (current) use of oral hypoglycemic drugs: Secondary | ICD-10-CM | POA: Insufficient documentation

## 2016-12-29 DIAGNOSIS — Z1231 Encounter for screening mammogram for malignant neoplasm of breast: Secondary | ICD-10-CM

## 2016-12-29 DIAGNOSIS — Z1239 Encounter for other screening for malignant neoplasm of breast: Secondary | ICD-10-CM

## 2016-12-29 DIAGNOSIS — M79602 Pain in left arm: Secondary | ICD-10-CM

## 2016-12-29 DIAGNOSIS — Z7951 Long term (current) use of inhaled steroids: Secondary | ICD-10-CM | POA: Insufficient documentation

## 2016-12-29 DIAGNOSIS — E118 Type 2 diabetes mellitus with unspecified complications: Secondary | ICD-10-CM

## 2016-12-29 LAB — POCT GLYCOSYLATED HEMOGLOBIN (HGB A1C): HEMOGLOBIN A1C: 6.7

## 2016-12-29 LAB — GLUCOSE, POCT (MANUAL RESULT ENTRY): POC Glucose: 113 mg/dl — AB (ref 70–99)

## 2016-12-29 MED ORDER — METHOCARBAMOL 500 MG PO TABS: 500.0000 mg | ORAL_TABLET | Freq: Three times a day (TID) | ORAL | 0 refills | Status: DC

## 2016-12-29 MED ORDER — MELOXICAM 15 MG PO TABS: 15.0000 mg | ORAL_TABLET | Freq: Every day | ORAL | 1 refills | Status: DC

## 2016-12-29 MED FILL — ?METFORMIN HCL 500MG TABLET: 500 | 30 days supply | Qty: 60 | Fill #4

## 2016-12-29 MED FILL — METHOCARBAMOL 500 MG TABLET: 500 | 30 days supply | Qty: 90 | Fill #0

## 2016-12-29 MED FILL — MELOXICAM 15 MG TABLET: 15 | 30 days supply | Qty: 30 | Fill #0 | Status: TO

## 2016-12-29 NOTE — Progress Notes (Signed)
Erin [name], is a 56 y.o. female  GTX:[id number]  YQM:[id number]  DOB - 25-Nov-1960  Subjective:  Chief Complaint and HPI: Erin [name] is a 56 y.o. female here today for L shoulder and LBP. ASL interpreter present.  Her L arm/shoulder has been bothering her a  Little over a week.  She does a lot of lifting and moving things at her job at [employer].  No numbness or weakness.  No CP/SOB.  NKI, but definitely some overuse.  Icy Hot has not helped much-she hasn't taken any other meds for it.  Also c/o LBP, R>L now for about 1 yr. She notices it esp after sitting for a while and also after long days of standing at work.  Xrays done on 07/20/2016 showed facet changes at L4-L5 and L5-S1.  She denies radicular s/sx.  Pain worse over last few weeks.  She also needs to get UTD on her MMG.  She denies hyper/hypoglycemia.   ROS:   Constitutional:  No f/c, No night sweats, No unexplained weight loss. EENT:  No vision changes, No blurry vision, No hearing changes. No mouth, throat, or ear problems.  Respiratory: No cough, No SOB Cardiac: No CP, no palpitations GI:  No abd pain, No N/V/D. GU: No Urinary s/sx Musculoskeletal: L arm and LBP Neuro: No headache, no dizziness, no motor weakness.  Skin: No rash Endocrine:  No polydipsia. No polyuria.  Psych: Denies SI/HI  No problems updated.  ALLERGIES: No Known Allergies  PAST MEDICAL HISTORY: Past Medical History:  Diagnosis Date  . Diabetes mellitus without complication ([place])     MEDICATIONS AT HOME: Prior to Admission medications   Medication Sig Start Date End Date Taking? Authorizing Provider  fluticasone (FLONASE) 50 MCG/ACT nasal spray Place 2 sprays into both nostrils daily. 02/18/15  Yes Lance Bosch, NP  glucose blood (ACCU-CHEK AVIVA) test strip Use as instructed 12/17/14  Yes Lance Bosch, NP  Lancet Devices [facility]) lancets Use as instructed 12/17/14  Yes Lance Bosch, NP  metFORMIN (GLUCOPHAGE XR) 500 MG 24 hr  tablet Take 2 tablets (1,000 mg total) by mouth daily with breakfast. 09/15/16  Yes Boykin Nearing, MD  TRUEPLUS LANCETS 28G MISC USE AS DIRECTED BY PHYSICIAN 03/13/15  Yes Lance Bosch, NP  Blood Glucose Monitoring Suppl (ACCU-CHEK AVIVA PLUS) W/DEVICE KIT Check Blood sugar AM fasting and before dinner 12/17/14   Lance Bosch, NP  Elastic Bandages & Supports (LUMBAR BACK BRACE/SUPPORT PAD) MISC 1 each by Does not apply route daily. 07/01/16   Josalyn Funches, MD  meloxicam (MOBIC) 15 MG tablet Take 1 tablet (15 mg total) by mouth daily. X 7 days then prn pain 12/29/16   Argentina Donovan, PA-C  methocarbamol (ROBAXIN) 500 MG tablet Take 1 tablet (500 mg total) by mouth 3 (three) times daily. X 7 days then prn muscle spasm 12/29/16   Argentina Donovan, PA-C     Objective:  EXAM:   Vitals:   12/29/16 1119  BP: 132/87  Pulse: 80  Resp: 16  Temp: 98.3 F (36.8 C)  TempSrc: Oral  Weight: 169 lb (76.7 kg)    General appearance : A&OX3. NAD. Non-toxic-appearing HEENT: Atraumatic and Normocephalic.  PERRLA. EOM intact.  Neck: supple, no JVD. No cervical lymphadenopathy. No thyromegaly Chest/Lungs:  Breathing-non-labored, Good air entry bilaterally, breath sounds normal without rales, rhonchi, or wheezing  CVS: S1 S2 regular, no murmurs, gallops, rubs  Extremities: Bilateral Lower Ext shows no edema, both legs  are warm to touch with = pulse throughout.  L vs R arm examined. +TTP in L axillary groove where pectoralis major and deltoid intersect.  Otherwise normal exam with norma S&ROM B and shoulder joints stable w/o laxity.  Normal grip strength and DTR=BUE and BLE Back:  +lumbar lordosis w/o bony tenderness.  There is paraspinus muscle spasm R>L Neurology:  CN II-XII grossly intact, Non focal.   Psych:  TP linear. J/I WNL. Normal speech. Appropriate eye contact and affect.  Skin:  No Rash  Data Review Lab Results  Component Value Date   HGBA1C 6.7 12/29/2016   HGBA1C 6.6 07/01/2016    HGBA1C 6.50 02/18/2015     Assessment & Plan   1. Type 2 diabetes mellitus with complication, without long-term current use of insulin (HCC) HgbA1C essentially same as 6 months ago.  Work harder on eliminating sugars and healthy diet changes and increase water intake.  Continue current medication regimen.  She refuses labs today(despite being overdue)due to cost.   - Glucose (CBG) - POCT glycosylated hemoglobin (Hb A1C)  2. Left arm pain - methocarbamol (ROBAXIN) 500 MG tablet; Take 1 tablet (500 mg total) by mouth 3 (three) times daily. X 7 days then prn muscle spasm  Dispense: 90 tablet; Refill: 0 - meloxicam (MOBIC) 15 MG tablet; Take 1 tablet (15 mg total) by mouth daily. X 7 days then prn pain  Dispense: 30 tablet; Refill: 1  3. Acute bilateral low back pain without sciatica - methocarbamol (ROBAXIN) 500 MG tablet; Take 1 tablet (500 mg total) by mouth 3 (three) times daily. X 7 days then prn muscle spasm  Dispense: 90 tablet; Refill: 0 - meloxicam (MOBIC) 15 MG tablet; Take 1 tablet (15 mg total) by mouth daily. X 7 days then prn pain  Dispense: 30 tablet; Refill: 1  4. Breast cancer screening - MM Digital Screening; Future  Patient have been counseled extensively about nutrition and exercise  Return in about 3 months (around 03/30/2017) for f/up Dr Adrian Blackwater DM.  The patient was given clear instructions to go to ER or return to medical center if symptoms don't improve, worsen or new problems develop. The patient verbalized understanding. The patient was told to call to get lab results if they haven't heard anything in the next week.     Freeman Caldron, PA-C [facility] [place], [place]   12/29/2016, 11:55 AMPatient ID: Erin [name], female   DOB: Dec 22, 1960, 56 y.o.   MRN: [id number]

## 2017-01-06 ENCOUNTER — Telehealth: Payer: Self-pay | Admitting: Family Medicine

## 2017-01-06 NOTE — Telephone Encounter (Signed)
Pt. Called stating that she was waiting for her nurse to call her back with a mammogram appt. Please f/u

## 2017-01-11 NOTE — Telephone Encounter (Signed)
[facility] was contaced to call pt and set up appointmennt

## 2017-01-27 ENCOUNTER — Encounter: Payer: Self-pay | Admitting: Family Medicine

## 2017-02-24 MED FILL — ?METFORMIN HCL 500MG TABLET: 500 | 30 days supply | Qty: 60 | Fill #5

## 2017-03-05 ENCOUNTER — Telehealth: Payer: Self-pay | Admitting: Family Medicine

## 2017-03-05 DIAGNOSIS — Z1239 Encounter for other screening for malignant neoplasm of breast: Secondary | ICD-10-CM

## 2017-03-05 NOTE — Telephone Encounter (Signed)
Will route to PCP 

## 2017-03-05 NOTE — Telephone Encounter (Signed)
Pt calling to request referral for mammogram. Please f/u.

## 2017-03-08 NOTE — Telephone Encounter (Signed)
Referral has been placed. 

## 2017-03-09 NOTE — Telephone Encounter (Signed)
Pt was called and a VM was left informing pt to return phone call. 

## 2017-03-19 ENCOUNTER — Ambulatory Visit: Payer: Self-pay | Admitting: Family Medicine

## 2017-04-02 ENCOUNTER — Ambulatory Visit: Payer: Medicare Other | Attending: Family Medicine | Admitting: Family Medicine

## 2017-04-02 ENCOUNTER — Encounter: Payer: Self-pay | Admitting: Family Medicine

## 2017-04-02 VITALS — BP 139/82 | HR 77 | Temp 97.5°F | Ht 61.0 in | Wt 170.8 lb

## 2017-04-02 DIAGNOSIS — G8929 Other chronic pain: Secondary | ICD-10-CM

## 2017-04-02 DIAGNOSIS — E118 Type 2 diabetes mellitus with unspecified complications: Secondary | ICD-10-CM | POA: Diagnosis not present

## 2017-04-02 DIAGNOSIS — K5900 Constipation, unspecified: Secondary | ICD-10-CM | POA: Diagnosis not present

## 2017-04-02 DIAGNOSIS — Z1231 Encounter for screening mammogram for malignant neoplasm of breast: Secondary | ICD-10-CM | POA: Diagnosis not present

## 2017-04-02 DIAGNOSIS — Z7984 Long term (current) use of oral hypoglycemic drugs: Secondary | ICD-10-CM | POA: Insufficient documentation

## 2017-04-02 DIAGNOSIS — M545 Low back pain, unspecified: Secondary | ICD-10-CM

## 2017-04-02 DIAGNOSIS — Z7951 Long term (current) use of inhaled steroids: Secondary | ICD-10-CM | POA: Insufficient documentation

## 2017-04-02 DIAGNOSIS — K5909 Other constipation: Secondary | ICD-10-CM

## 2017-04-02 DIAGNOSIS — R140 Abdominal distension (gaseous): Secondary | ICD-10-CM | POA: Diagnosis not present

## 2017-04-02 DIAGNOSIS — Z1159 Encounter for screening for other viral diseases: Secondary | ICD-10-CM | POA: Diagnosis not present

## 2017-04-02 DIAGNOSIS — E119 Type 2 diabetes mellitus without complications: Secondary | ICD-10-CM | POA: Diagnosis not present

## 2017-04-02 LAB — GLUCOSE, POCT (MANUAL RESULT ENTRY): POC Glucose: 107 mg/dl — AB (ref 70–99)

## 2017-04-02 LAB — POCT GLYCOSYLATED HEMOGLOBIN (HGB A1C): Hemoglobin A1C: 6.6

## 2017-04-02 MED ORDER — POLYETHYLENE GLYCOL 3350 17 GM/SCOOP PO POWD: 17.0000 g | Freq: Every day | ORAL | 1 refills | Status: DC

## 2017-04-02 MED ORDER — METFORMIN HCL ER 500 MG PO TB24: 1000.0000 mg | ORAL_TABLET | Freq: Every day | ORAL | 11 refills | Status: DC

## 2017-04-02 MED FILL — POLYETHYLENE GLYCOL 3350 PO: 15 days supply | Qty: 225 | Fill #0 | Status: TO

## 2017-04-02 MED FILL — METFORMIN HCL ER 500 MG TAB: 500 | 30 days supply | Qty: 60 | Fill #0

## 2017-04-02 MED FILL — ?METFORMIN HCL 500MG TABLET: 500 | 30 days supply | Qty: 60 | Fill #6

## 2017-04-02 NOTE — Assessment & Plan Note (Signed)
MSK pain Referral to PT

## 2017-04-02 NOTE — Assessment & Plan Note (Addendum)
Well controlled Continue metformin Check CMP and lipids Referral to opthalmology  Urine microalbumin  Normal metabolic panel Cholesterol is normal Given diabetes and age, statin is recommended Lipitor 40 mg daily ordered to reduce risk of heart disease and stroke Also recommend aspirin 81 mg daily

## 2017-04-02 NOTE — Patient Instructions (Addendum)
Aerie was seen today for diabetes.  Diagnoses and all orders for this visit:  Type 2 diabetes mellitus with complication, without long-term current use of insulin (HCC) -     POCT glucose (manual entry) -     POCT glycosylated hemoglobin (Hb A1C) -     POCT UA - Microalbumin -     CMP14+EGFR -     Lipid Panel -     Ambulatory referral to Ophthalmology  Abdominal bloating -     US Pelvis Complete; Future -     US Transvaginal Non-OB; Future  Intermittent constipation -     US Pelvis Complete; Future -     US Transvaginal Non-OB; Future  Chronic right-sided low back pain without sciatica -     Ambulatory referral to Physical Therapy  Type 2 diabetes mellitus without complication, without long-term current use of insulin (HCC) -     metFORMIN (GLUCOPHAGE XR) 500 MG 24 hr tablet; Take 2 tablets (1,000 mg total) by mouth daily with breakfast.  Need for hepatitis C screening test -     Hepatitis C antibody, reflex  Visit for screening mammogram -     MM DIGITAL SCREENING BILATERAL; Future  you will be called with results pital.  Please call Jonna Clark  [phone], with the [facility] (breast and cervical cancer control program) to schedule screening mammogram    F/u in 6 months for diabetes, sooner if needed   Dr. Adrian Blackwater

## 2017-04-02 NOTE — Progress Notes (Signed)
Subjective:  Patient ID: Erin [name], female    DOB: 02/22/1961  Age: 56 y.o. MRN: [id number]  CC: Diabetes   HPI Erin [name] presents for   1. CHRONIC DIABETES  Disease Monitoring  Blood Sugar Ranges: not checking   Polyuria: no   Visual problems: no   Medication Compliance: yes  Medication Side Effects  Hypoglycemia: yes   Preventitive Health Care  Eye Exam: due   Foot Exam: done today     2. Chronic low back pain: x 2 years. She works at [employer]. She works for 5 hours at a time. She stands for 3.5 hours.  She has R sided low back pain. Non radiating. Sharp pain in back when rising for sitting or lying down and when bending forward. Her pain interferes with her sleep. She reports that she bends often and work and does heavy lifting. No fecal or urinary incontinence. No fever, chills, weight loss. No rash. She takes ibuprofen as needed for pain which helps temporarily. She tried mobic and robaxin without relief of pain.   3. Abdominal bloating: intermittent with constipation. No nausea, vomiting or emesis.    Social History  Substance Use Topics  . Smoking status: Never Smoker  . Smokeless tobacco: Never Used  . Alcohol use No    Outpatient Medications Prior to Visit  Medication Sig Dispense Refill  . Blood Glucose Monitoring Suppl (ACCU-CHEK AVIVA PLUS) W/DEVICE KIT Check Blood sugar AM fasting and before dinner 1 kit 0  . Elastic Bandages & Supports (LUMBAR BACK BRACE/SUPPORT PAD) MISC 1 each by Does not apply route daily. 1 each 0  . glucose blood (ACCU-CHEK AVIVA) test strip Use as instructed 100 each 12  . Lancet Devices (ACCU-CHEK SOFTCLIX) lancets Use as instructed 1 each 0  . meloxicam (MOBIC) 15 MG tablet Take 1 tablet (15 mg total) by mouth daily. X 7 days then prn pain 30 tablet 1  . metFORMIN (GLUCOPHAGE XR) 500 MG 24 hr tablet Take 2 tablets (1,000 mg total) by mouth daily with breakfast. 60 tablet 5  . methocarbamol (ROBAXIN) 500 MG tablet Take 1  tablet (500 mg total) by mouth 3 (three) times daily. X 7 days then prn muscle spasm 90 tablet 0  . TRUEPLUS LANCETS 28G MISC USE AS DIRECTED BY PHYSICIAN 100 each 0  . fluticasone (FLONASE) 50 MCG/ACT nasal spray Place 2 sprays into both nostrils daily. (Patient not taking: Reported on 04/02/2017) 16 g 6   No facility-administered medications prior to visit.     ROS Review of Systems  Constitutional: Negative for chills, fatigue and fever.  Eyes: Negative for visual disturbance.  Respiratory: Negative for shortness of breath.   Cardiovascular: Negative for chest pain.  Gastrointestinal: Positive for abdominal pain and constipation. Negative for blood in stool.  Musculoskeletal: Positive for back pain. Negative for arthralgias.  Skin: Negative for rash.  Allergic/Immunologic: Negative for immunocompromised state.  Hematological: Negative for adenopathy. Does not bruise/bleed easily.  Psychiatric/Behavioral: Negative for dysphoric mood, sleep disturbance and suicidal ideas.    Objective:  BP 139/82   Pulse 77   Temp (!) 97.5 F (36.4 C) (Oral)   Ht _0  (1.549 m)   Wt 170 lb 12.8 oz (77.5 kg)   SpO2 98%   BMI 32.27 kg/m  BP/Weight 04/02/2017 12/29/2016 65/0/3546  Systolic BP 568 127 517  Diastolic BP 82 87 77  Wt. (Lbs) 170.8 169 -  BMI 32.27 31.93 -    Physical Exam  Constitutional: She is oriented to person, place, and time. She appears well-developed and well-nourished. No distress.  HENT:  Head: Normocephalic and atraumatic.  Cardiovascular: Normal rate, regular rhythm, normal heart sounds and intact distal pulses.   Pulmonary/Chest: Effort normal and breath sounds normal.  Abdominal: Soft. She exhibits no distension and no mass. There is no tenderness. There is no rebound and no guarding.  Musculoskeletal: She exhibits no edema.  Back Exam: Back: Normal Curvature, no deformities or CVA tenderness  Paraspinal Tenderness: R lumbar   LE Strength 5/5  LE Sensation: in  tact  LE Reflexes 2+ and symmetric  Straight leg raise: negative    Neurological: She is alert and oriented to person, place, and time.  Skin: Skin is warm and dry. No rash noted.  Psychiatric: She has a normal mood and affect.   Lab Results  Component Value Date   HGBA1C 6.6 04/02/2017     CBG 107  Depression screen [facility] 2/9 12/29/2016 07/01/2016 02/18/2015  Decreased Interest 1 0 0  Down, Depressed, Hopeless 0 0 0  PHQ - 2 Score 1 0 0  Altered sleeping 1 2 -  Tired, decreased energy 2 2 -  Change in appetite 1 1 -  Feeling bad or failure about yourself  1 0 -  Trouble concentrating 2 0 -  Moving slowly or fidgety/restless 0 0 -  Suicidal thoughts 0 0 -  PHQ-9 Score 8 5 -   GAD 7 : Generalized Anxiety Score 12/29/2016 07/01/2016  Nervous, Anxious, on Edge 0 0  Control/stop worrying 0 0  Worry too much - different things 1 0  Trouble relaxing 0 0  Restless 0 0  Easily annoyed or irritable 0 0  Afraid - awful might happen 2 0  Total GAD 7 Score 3 0     Assessment & Plan:   Erin [name] was seen today for diabetes.  Diagnoses and all orders for this visit:  Type 2 diabetes mellitus with complication, without long-term current use of insulin (HCC) -     POCT glucose (manual entry) -     POCT glycosylated hemoglobin (Hb A1C) -     POCT UA - Microalbumin -     CMP14+EGFR -     Lipid Panel -     Ambulatory referral to Ophthalmology  Abdominal bloating -     US Pelvis Complete; Future -     US Transvaginal Non-OB; Future  Intermittent constipation -     US Pelvis Complete; Future -     US Transvaginal Non-OB; Future  Chronic right-sided low back pain without sciatica -     Ambulatory referral to Physical Therapy  Type 2 diabetes mellitus without complication, without long-term current use of insulin (HCC) -     metFORMIN (GLUCOPHAGE XR) 500 MG 24 hr tablet; Take 2 tablets (1,000 mg total) by mouth daily with breakfast.  Need for hepatitis C screening test -      Cancel: Hepatitis C antibody, reflex -     Cancel: Hepatitis c antibody (reflex) -     Hepatitis c antibody (reflex); Future  Visit for screening mammogram -     MM DIGITAL SCREENING BILATERAL; Future    No orders of the defined types were placed in this encounter.   Follow-up: Return in about 6 months (around 10/03/2017) for diabetes .   Boykin Nearing MD

## 2017-04-02 NOTE — Assessment & Plan Note (Signed)
Abdominal bloating with intermittent constipation miralax Pelvic and transvaginal ultrasound

## 2017-04-03 LAB — LIPID PANEL
CHOL/HDL RATIO: 3.6 ratio (ref 0.0–4.4)
CHOLESTEROL TOTAL: 151 mg/dL (ref 100–199)
HDL: 42 mg/dL (ref >39)
LDL CALC: 79 mg/dL (ref 0–99)
TRIGLYCERIDES: 148 mg/dL (ref 0–149)
VLDL Cholesterol Cal: 30 mg/dL (ref 5–40)

## 2017-04-03 LAB — CMP14+EGFR
ALK PHOS: 73 IU/L (ref 39–117)
ALT: 24 IU/L (ref 0–32)
AST: 19 IU/L (ref 0–40)
Albumin/Globulin Ratio: 1.2 (ref 1.2–2.2)
Albumin: 4.1 g/dL (ref 3.5–5.5)
BUN/Creatinine Ratio: 13 (ref 9–23)
BUN: 8 mg/dL (ref 6–24)
Bilirubin Total: <0.2 mg/dL (ref 0.0–1.2)
CALCIUM: 9.4 mg/dL (ref 8.7–10.2)
CO2: 21 mmol/L (ref 20–29)
CREATININE: 0.6 mg/dL (ref 0.57–1.00)
Chloride: 103 mmol/L (ref 96–106)
GFR calc Af Amer: 120 mL/min/{1.73_m2} (ref >59)
GFR, EST NON AFRICAN AMERICAN: 104 mL/min/{1.73_m2} (ref >59)
GLUCOSE: 100 mg/dL — AB (ref 65–99)
Globulin, Total: 3.4 g/dL (ref 1.5–4.5)
Potassium: 4.4 mmol/L (ref 3.5–5.2)
SODIUM: 140 mmol/L (ref 134–144)
Total Protein: 7.5 g/dL (ref 6.0–8.5)

## 2017-04-05 ENCOUNTER — Ambulatory Visit: Payer: Self-pay

## 2017-04-05 ENCOUNTER — Ambulatory Visit: Payer: Self-pay | Attending: Family Medicine

## 2017-04-05 DIAGNOSIS — Z1159 Encounter for screening for other viral diseases: Secondary | ICD-10-CM

## 2017-04-05 MED ORDER — ATORVASTATIN CALCIUM 40 MG PO TABS: 40.0000 mg | ORAL_TABLET | Freq: Every day | ORAL | 3 refills | Status: DC

## 2017-04-05 MED ORDER — ASPIRIN 81 MG PO TABS: 81.0000 mg | ORAL_TABLET | Freq: Every day | ORAL | 3 refills | Status: DC

## 2017-04-05 MED FILL — ATORVASTATIN 40 MG TABLET: 40 | 30 days supply | Qty: 30 | Fill #0

## 2017-04-05 NOTE — Addendum Note (Signed)
Addended by: Boykin Nearing on: 04/05/2017 08:30 AM   Modules accepted: Orders

## 2017-04-06 LAB — HEPATITIS C ANTIBODY (REFLEX)

## 2017-04-06 LAB — HCV COMMENT:

## 2017-04-07 ENCOUNTER — Telehealth: Payer: Self-pay

## 2017-04-07 ENCOUNTER — Other Ambulatory Visit: Payer: Self-pay | Admitting: Obstetrics and Gynecology

## 2017-04-07 DIAGNOSIS — Z1231 Encounter for screening mammogram for malignant neoplasm of breast: Secondary | ICD-10-CM

## 2017-04-07 NOTE — Telephone Encounter (Signed)
Pt is def and can not get phone calls lab results will be mailed out.

## 2017-04-08 ENCOUNTER — Ambulatory Visit (HOSPITAL_COMMUNITY): Payer: Self-pay

## 2017-04-12 ENCOUNTER — Ambulatory Visit (HOSPITAL_COMMUNITY): Payer: Self-pay

## 2017-04-19 ENCOUNTER — Ambulatory Visit (HOSPITAL_COMMUNITY): Admission: RE | Admit: 2017-04-19 | Payer: Medicare Other | Source: Ambulatory Visit

## 2017-04-21 ENCOUNTER — Ambulatory Visit: Payer: Medicare Other | Admitting: Physical Therapy

## 2017-04-22 ENCOUNTER — Ambulatory Visit
Admission: RE | Admit: 2017-04-22 | Discharge: 2017-04-22 | Disposition: A | Discharge status: Home / Self Care | Payer: Medicare Other | Source: Ambulatory Visit | Attending: Obstetrics and Gynecology | Admitting: Obstetrics and Gynecology

## 2017-04-22 ENCOUNTER — Encounter (HOSPITAL_COMMUNITY): Payer: Self-pay

## 2017-04-22 ENCOUNTER — Encounter: Payer: Self-pay | Admitting: Radiology

## 2017-04-22 ENCOUNTER — Ambulatory Visit (HOSPITAL_COMMUNITY)
Admission: RE | Admit: 2017-04-22 | Discharge: 2017-04-22 | Disposition: A | Discharge status: Home / Self Care | Payer: Self-pay | Source: Ambulatory Visit | Attending: Obstetrics and Gynecology | Admitting: Obstetrics and Gynecology

## 2017-04-22 VITALS — BP 142/79 | HR 84 | Temp 97.7°F | Ht 61.0 in | Wt 169.4 lb

## 2017-04-22 DIAGNOSIS — Z1231 Encounter for screening mammogram for malignant neoplasm of breast: Secondary | ICD-10-CM

## 2017-04-22 DIAGNOSIS — Z1239 Encounter for other screening for malignant neoplasm of breast: Secondary | ICD-10-CM

## 2017-04-22 NOTE — Patient Instructions (Signed)
Explained breast self awareness with [name]. Patient did not need a Pap smear today due to last Pap smear was 11/18/2014. Let her know [facility] will cover Pap smears every 3 years unless has a history of abnormal Pap smears. Referred patient to the [facility] for a screening mammogram. Appointment scheduled for Thursday, April 22, 2017 at 1410. Let patient know the [facility] will follow up with her within the next couple weeks with results of mammogram by letter or phone. [name] verbalized understanding.  [place], Arvil Chaco, RN 2:57 PM

## 2017-04-22 NOTE — Progress Notes (Signed)
No complaints today.   Pap Smear: Pap smear not completed today. Last Pap smear was 11/18/2014 at [facility] and normal. Per patient has no history of an abnormal Pap smear. Last Pap smear result is in EPIC.  Physical exam: Breasts Breasts symmetrical. No skin abnormalities bilateral breasts. No nipple retraction bilateral breasts. No nipple discharge bilateral breasts. No lymphadenopathy. No lumps palpated bilateral breasts. No complaints of pain or tenderness on exam. Referred patient to the [place] for a screening mammogram. Appointment scheduled for Thursday, April 22, 2017 at 1410.        Pelvic/Bimanual No Pap smear completed today since last Pap smear was 11/18/2014. Pap smear not indicated per [facility] guidelines.   Smoking History: Patient has never smoked.  Patient Navigation: Patient education provided. Access to services provided for patient through [place] program. Sign Language interpreter provided.  Colorectal Cancer Screening: Per patient has never had a colonoscopy completed. No complaints today. FIT Test given to patient to complete and return to [facility].  Used sign language interpreter Rachyl Stump from [facility].

## 2017-04-26 ENCOUNTER — Other Ambulatory Visit: Payer: Self-pay | Admitting: Obstetrics and Gynecology

## 2017-04-26 DIAGNOSIS — R928 Other abnormal and inconclusive findings on diagnostic imaging of breast: Secondary | ICD-10-CM

## 2017-04-30 ENCOUNTER — Other Ambulatory Visit: Payer: No Typology Code available for payment source

## 2017-05-04 ENCOUNTER — Other Ambulatory Visit: Payer: Self-pay | Admitting: Obstetrics and Gynecology

## 2017-05-04 ENCOUNTER — Ambulatory Visit
Admission: RE | Admit: 2017-05-04 | Discharge: 2017-05-04 | Disposition: A | Discharge status: Home / Self Care | Payer: No Typology Code available for payment source | Source: Ambulatory Visit | Attending: Obstetrics and Gynecology | Admitting: Obstetrics and Gynecology

## 2017-05-04 DIAGNOSIS — N6320 Unspecified lump in the left breast, unspecified quadrant: Secondary | ICD-10-CM

## 2017-05-04 DIAGNOSIS — R928 Other abnormal and inconclusive findings on diagnostic imaging of breast: Secondary | ICD-10-CM

## 2017-05-06 ENCOUNTER — Other Ambulatory Visit: Payer: Self-pay | Admitting: Obstetrics and Gynecology

## 2017-05-06 DIAGNOSIS — N6320 Unspecified lump in the left breast, unspecified quadrant: Secondary | ICD-10-CM

## 2017-05-10 ENCOUNTER — Encounter (HOSPITAL_COMMUNITY): Payer: Self-pay

## 2017-05-10 ENCOUNTER — Other Ambulatory Visit: Payer: Self-pay | Admitting: Obstetrics and Gynecology

## 2017-05-11 ENCOUNTER — Ambulatory Visit
Admission: RE | Admit: 2017-05-11 | Discharge: 2017-05-11 | Disposition: A | Discharge status: Home / Self Care | Payer: Medicare Other | Source: Ambulatory Visit | Attending: Obstetrics and Gynecology | Admitting: Obstetrics and Gynecology

## 2017-05-11 DIAGNOSIS — N6320 Unspecified lump in the left breast, unspecified quadrant: Secondary | ICD-10-CM

## 2017-05-13 ENCOUNTER — Telehealth: Payer: Self-pay | Admitting: Hematology and Oncology

## 2017-05-13 ENCOUNTER — Other Ambulatory Visit: Payer: Self-pay | Admitting: *Deleted

## 2017-05-13 DIAGNOSIS — Z171 Estrogen receptor negative status [ER-]: Principal | ICD-10-CM

## 2017-05-13 DIAGNOSIS — C50512 Malignant neoplasm of lower-outer quadrant of left female breast: Secondary | ICD-10-CM | POA: Insufficient documentation

## 2017-05-13 NOTE — Telephone Encounter (Signed)
Confirmed appointment with patients mother for upcoming Breast Clinic 05/19/17, intake form emailed to [email or web address]. 05/13/17

## 2017-05-14 LAB — FECAL OCCULT BLOOD, IMMUNOCHEMICAL: FECAL OCCULT BLD: NEGATIVE

## 2017-05-14 LAB — PLEASE NOTE

## 2017-05-15 DIAGNOSIS — C50919 Malignant neoplasm of unspecified site of unspecified female breast: Secondary | ICD-10-CM

## 2017-05-15 HISTORY — DX: Malignant neoplasm of unspecified site of unspecified female breast: C50.919

## 2017-05-18 ENCOUNTER — Encounter: Payer: Self-pay | Admitting: *Deleted

## 2017-05-18 MED FILL — ATORVASTATIN 40 MG TABLET: 40 | 30 days supply | Qty: 30 | Fill #1

## 2017-05-18 MED FILL — METFORMIN HCL ER 500 MG TAB: 500 | 30 days supply | Qty: 60 | Fill #1

## 2017-05-19 ENCOUNTER — Other Ambulatory Visit (HOSPITAL_BASED_OUTPATIENT_CLINIC_OR_DEPARTMENT_OTHER): Payer: Medicaid Other

## 2017-05-19 ENCOUNTER — Encounter: Payer: Self-pay | Admitting: Hematology and Oncology

## 2017-05-19 ENCOUNTER — Ambulatory Visit: Payer: Self-pay | Admitting: General Surgery

## 2017-05-19 ENCOUNTER — Ambulatory Visit
Admission: RE | Admit: 2017-05-19 | Discharge: 2017-05-19 | Disposition: A | Discharge status: Home / Self Care | Payer: Self-pay | Source: Ambulatory Visit | Attending: Radiation Oncology | Admitting: Radiation Oncology

## 2017-05-19 ENCOUNTER — Encounter: Payer: Self-pay | Admitting: Physical Therapy

## 2017-05-19 ENCOUNTER — Ambulatory Visit (HOSPITAL_BASED_OUTPATIENT_CLINIC_OR_DEPARTMENT_OTHER): Payer: Medicare Other | Admitting: Hematology and Oncology

## 2017-05-19 ENCOUNTER — Ambulatory Visit: Payer: Medicaid Other | Attending: General Surgery | Admitting: Physical Therapy

## 2017-05-19 VITALS — BP 159/81 | HR 85 | Temp 98.1°F | Resp 17 | Ht 61.0 in | Wt 165.5 lb

## 2017-05-19 DIAGNOSIS — C50512 Malignant neoplasm of lower-outer quadrant of left female breast: Secondary | ICD-10-CM

## 2017-05-19 DIAGNOSIS — Z171 Estrogen receptor negative status [ER-]: Secondary | ICD-10-CM | POA: Insufficient documentation

## 2017-05-19 DIAGNOSIS — E119 Type 2 diabetes mellitus without complications: Secondary | ICD-10-CM

## 2017-05-19 DIAGNOSIS — R293 Abnormal posture: Secondary | ICD-10-CM | POA: Diagnosis present

## 2017-05-19 DIAGNOSIS — C50912 Malignant neoplasm of unspecified site of left female breast: Secondary | ICD-10-CM

## 2017-05-19 LAB — COMPREHENSIVE METABOLIC PANEL
ALT: 17 U/L (ref 0–55)
AST: 16 U/L (ref 5–34)
Albumin: 3.5 g/dL (ref 3.5–5.0)
Alkaline Phosphatase: 78 U/L (ref 40–150)
Anion Gap: 8 mEq/L (ref 3–11)
BUN: 9 mg/dL (ref 7.0–26.0)
CO2: 26 meq/L (ref 22–29)
Calcium: 9.7 mg/dL (ref 8.4–10.4)
Chloride: 104 mEq/L (ref 98–109)
Creatinine: 0.7 mg/dL (ref 0.6–1.1)
EGFR: >90 mL/min/{1.73_m2} (ref >90)
GLUCOSE: 104 mg/dL (ref 70–140)
Potassium: 3.8 mEq/L (ref 3.5–5.1)
Sodium: 139 mEq/L (ref 136–145)
TOTAL PROTEIN: 8.1 g/dL (ref 6.4–8.3)

## 2017-05-19 LAB — CBC WITH DIFFERENTIAL/PLATELET
BASO%: 0.1 % (ref 0.0–2.0)
Basophils Absolute: 0 10*3/uL (ref 0.0–0.1)
EOS%: 0.8 % (ref 0.0–7.0)
Eosinophils Absolute: 0.1 10*3/uL (ref 0.0–0.5)
HCT: 34.7 % — ABNORMAL LOW (ref 34.8–46.6)
HEMOGLOBIN: 10.8 g/dL — AB (ref 11.6–15.9)
LYMPH%: 33.5 % (ref 14.0–49.7)
MCH: 26.7 pg (ref 25.1–34.0)
MCHC: 31.1 g/dL — ABNORMAL LOW (ref 31.5–36.0)
MCV: 85.9 fL (ref 79.5–101.0)
MONO#: 0.7 10*3/uL (ref 0.1–0.9)
MONO%: 6.2 % (ref 0.0–14.0)
NEUT%: 59.4 % (ref 38.4–76.8)
NEUTROS ABS: 6.9 10*3/uL — AB (ref 1.5–6.5)
Platelets: 339 10*3/uL (ref 145–400)
RBC: 4.04 10*6/uL (ref 3.70–5.45)
RDW: 16.2 % — ABNORMAL HIGH (ref 11.2–14.5)
WBC: 11.7 10*3/uL — AB (ref 3.9–10.3)
lymph#: 3.9 10*3/uL — ABNORMAL HIGH (ref 0.9–3.3)

## 2017-05-19 NOTE — Therapy (Signed)
[place], [place], [address] Phone: [phone]   Fax:  [phone]  Physical Therapy Evaluation  Patient Details  Name: Erin [name] MRN: [id number] Date of Birth: [birth year] Referring Provider: Dr. Excell Seltzer  Encounter Date: 05/19/2017      PT End of Session - 05/19/17 1139    Visit Number 1   Number of Visits 1   PT Start Time 1036   PT Stop Time 1100   PT Time Calculation (min) 24 min   Activity Tolerance Patient tolerated treatment well   Behavior During Therapy [facility] for tasks assessed/performed      Past Medical History:  Diagnosis Date  . Diabetes mellitus without complication [facility])     Past Surgical History:  Procedure Laterality Date  . TUBAL LIGATION      There were no vitals filed for this visit.       Subjective Assessment - 05/19/17 1120    Subjective Patient reports she is here today to be seen by her medical team for her newly diagnosed left breast cancer.   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 It is located in the lower outer quadrant and measures 1 cm. It is ER/PR negative and HER2 positive with a Ki67 of 20%. She is also deaf (since birth) and always needs an interpreter present.   Patient Stated Goals Reduce lymphedema risk and learn post op shoulder ROM HEP   Currently in Pain? No/denies            [facility] PT Assessment - 05/19/17 0001      Assessment   Medical Diagnosis Left breast cancer   Referring Provider Dr. Excell Seltzer   Onset Date/Surgical Date 04/22/17   Hand Dominance Right   Prior Therapy none     Precautions   Precautions Other (comment)   Precaution Comments Deaf; active cancer     Restrictions   Weight Bearing Restrictions No     Balance Screen   Has the patient fallen in the past 6 months No   Has the  patient had a decrease in activity level because of a fear of falling?  No   Is the patient reluctant to leave their home because of a fear of falling?  No     Home Environment   Living Environment Private residence   Living Arrangements Children  15 and 13 y.o. children   Available Help at Discharge Family     Prior Function   Level of Wanda Unemployed   Leisure She does not exercise     Cognition   Overall Cognitive Status Within Functional Limits for tasks assessed     Posture/Postural Control   Posture/Postural Control Postural limitations   Postural Limitations Rounded Shoulders;Forward head     ROM / Strength   AROM / PROM / Strength AROM;Strength     AROM   AROM Assessment Site Shoulder;Cervical   Right/Left Shoulder Right;Left   Right Shoulder Extension 35 Degrees   Right Shoulder Flexion 152 Degrees   Right Shoulder ABduction 145 Degrees   Right Shoulder Internal Rotation 60 Degrees   Right Shoulder External Rotation 59 Degrees   Left Shoulder Extension 35 Degrees   Left Shoulder Flexion 135 Degrees   Left Shoulder ABduction 156 Degrees   Left Shoulder Internal Rotation 67 Degrees   Left Shoulder External Rotation  69 Degrees   Cervical Flexion WNL   Cervical Extension WNL   Cervical - Right Side Bend WNL   Cervical - Left Side Bend WNL   Cervical - Right Rotation WNL   Cervical - Left Rotation WNL     Strength   Overall Strength Within functional limits for tasks performed           LYMPHEDEMA/ONCOLOGY QUESTIONNAIRE - 05/19/17 1137      Type   Cancer Type Left breast cancer     Lymphedema Assessments   Lymphedema Assessments Upper extremities     Right Upper Extremity Lymphedema   10 cm Proximal to Olecranon Process 32.2 cm   Olecranon Process 25.4 cm   10 cm Proximal to Ulnar Styloid Process 24 cm   Just Proximal to Ulnar Styloid Process 16.3 cm   Across Hand at [employer] 18.6 cm   At [place] of 2nd Digit 6.3  cm     Left Upper Extremity Lymphedema   10 cm Proximal to Olecranon Process 31.4 cm   Olecranon Process 25.4 cm   10 cm Proximal to Ulnar Styloid Process 23.9 cm   Just Proximal to Ulnar Styloid Process 16.5 cm   Across Hand at [employer] 19.3 cm   At [place] of 2nd Digit 6.3 cm         Objective measurements completed on examination: See above findings.     Patient was instructed today in a home exercise program today for post op shoulder range of motion. These included active assist shoulder flexion in sitting, scapular retraction, wall walking with shoulder abduction, and hands behind head external rotation.  She was encouraged to do these twice a day, holding 3 seconds and repeating 5 times when permitted by her physician.         PT Education - 05/19/17 1138    Education provided Yes   Education Details Lymphedema risk reduction and post op shoulder ROM HEP   Person(s) Educated Patient;Parent(s);Other (comment)  Interpreter   Methods Explanation;Demonstration;Handout   Comprehension Verbalized understanding;Returned demonstration              Breast Clinic Goals - 05/19/17 1142      Patient will be able to verbalize understanding of pertinent lymphedema risk reduction practices relevant to her diagnosis specifically related to skin care.   Time 1   Period Days   Status Achieved     Patient will be able to return demonstrate and/or verbalize understanding of the post-op home exercise program related to regaining shoulder range of motion.   Time 1   Period Days   Status Achieved     Patient will be able to verbalize understanding of the importance of attending the postoperative After Breast Cancer Class for further lymphedema risk reduction education and therapeutic exercise.   Time 1   Period Days   Status Achieved               Plan - 05/19/17 1139    Clinical Impression Statement Patient was diagnosed on 04/22/17 with left grade 2-3 invasive  ductal carcinoma breast cancer. It is located in the lower outer quadrant and measures 1 cm. It is ER/PR negative and HER2 positive with a Ki67 of 20%. She is also deaf (since birth) and always needs an interpreter present. Her multidisciplinary medical team met prior to her assessments to determine a recommended treatment plan. She is planning to have a left lumpectomy and sentinel node biopsy followed by  chemotherapy and radiation. She may benefit from post op PT to regain shoulder ROM and reduce lymphedema risk.   History and Personal Factors relevant to plan of care: Deaf   Clinical Presentation Stable   Clinical Decision Making Low   Rehab Potential Excellent   Clinical Impairments Affecting Rehab Potential None - will need interpreter for sign language   PT Frequency One time visit   PT Treatment/Interventions Patient/family education;Therapeutic exercise   PT Next Visit Plan Will f/u after surgery to determine PT needs   PT Home Exercise Plan Post op shoulder ROM HEP   Consulted and Agree with Plan of Care Patient;Family member/caregiver   Family Member Consulted Mother      Patient will benefit from skilled therapeutic intervention in order to improve the following deficits and impairments:  Postural dysfunction, Decreased knowledge of precautions, Pain, Impaired UE functional use, Decreased range of motion  Visit Diagnosis: Carcinoma of lower-outer quadrant of left breast in female, estrogen receptor negative ([place]) - Plan: PT plan of care cert/re-cert  Abnormal posture - Plan: PT plan of care cert/re-cert      G-Codes - 26/83/41 1142    Functional Assessment Tool Used (Outpatient Only) Clinical Judgement   Functional Limitation Other PT primary   Other PT Primary Current Status (D6222) At least 1 percent but less than 20 percent impaired, limited or restricted   Other PT Primary Goal Status (L7989) At least 1 percent but less than 20 percent impaired, limited or restricted    Other PT Primary Discharge Status (Q1194) At least 1 percent but less than 20 percent impaired, limited or restricted     Patient will follow up at outpatient cancer rehab if needed following surgery.  If the patient requires physical therapy at that time, a specific plan will be dictated and sent to the referring physician for approval. The patient was educated today on appropriate basic range of motion exercises to begin post operatively and the importance of attending the After Breast Cancer class following surgery.  Patient was educated today on lymphedema risk reduction practices as it pertains to recommendations that will benefit the patient immediately following surgery.  She verbalized good understanding.  No additional physical therapy is indicated at this time.     Problem List Patient Active Problem List   Diagnosis Date Noted  . Malignant neoplasm of lower-outer quadrant of left breast of female, estrogen receptor negative ([place]) 05/13/2017  . Abdominal bloating 04/02/2017  . Chronic right-sided low back pain without sciatica 07/02/2016  . DM type 2 (diabetes mellitus, type 2) ([place]) 11/18/2014  . Intermittent constipation 06/06/2010  . UNSPECIFIED IRON DEFICIENCY ANEMIA 04/22/2010  . DEPRESSION 04/10/2010  . DEAFNESS 04/10/2010    Annia Friendly, PT 05/19/17 11:43 AM  [place], [place], [address] Phone: [phone]   Fax:  [phone]  Name: Erin [name] MRN: [id number] Date of Birth: 09/09/1961

## 2017-05-19 NOTE — Progress Notes (Signed)
Nutrition Assessment  Reason for Assessment:  Pt seen in Breast Clinic  ASSESSMENT:   56 year old female with new diagnosis of breast cancer.  Past medical history of DM.  Patient is deaf and interpreter present during visit.  Patient reports normal appetite.  Medications:  metformin  Labs: reviewed  Anthropometrics:   Height: 61 inches Weight: 165 lb 8 oz BMI: 31   NUTRITION DIAGNOSIS: Food and nutrition related knowledge deficit related to new diagnosis of breast cancer as evidenced by no prior need for nutrition related information.  INTERVENTION:   Discussed and provided packet of information regarding nutritional tips for breast cancer patients.  Questions answered.  Teachback method used.  Contact information provided and patient knows to contact me with questions/concerns.    MONITORING, EVALUATION, and GOAL: Pt will consume a healthy plant based diet to maintain lean body mass throughout treatment.   Haeli Gerlich B. Zenia Resides, [place], [place] Registered Dietitian [phone] (pager)

## 2017-05-19 NOTE — Progress Notes (Signed)
Erin [name] NOTE  Patient Care Team: Boykin Nearing, MD as PCP - General (Family Medicine) Excell Seltzer, MD as Consulting Physician (General Surgery) [name], MD as Consulting Physician (Hematology and Oncology) Kyung Rudd, MD as Consulting Physician (Radiation Oncology)  CHIEF COMPLAINTS/PURPOSE OF CONSULTATION:  Newly diagnosed breast cancer  HISTORY OF PRESENTING ILLNESS:  Erin [name] 56 y.o. female is here because of recent diagnosis of left breast cancer. Patient had a routine screening mammogram that detected an abnormality in the left breast 6 cm from nipple at 3:30 position measuring 1 cm in size. This was biopsied by ultrasound came back as invasive ductal carcinoma with DCIS. There was lymphovascular invasion and it was grade 2-3 with ER 0% PR 0% HER-2 positive with a Ki-67 20%. She was presented this morning at the multidisciplinary tumor board and she is here today to discuss a treatment plan. Patient is deaf and cannot speak as well. Much of this discussion was made with the help of interpreter. Patient appears to be very anxious and apparently had scratched her neck and that caused some bleeding on the neck.  I reviewed her records extensively and collaborated the history with the patient.  SUMMARY OF ONCOLOGIC HISTORY:   Malignant neoplasm of lower-outer quadrant of left breast of female, estrogen receptor negative ([place])   05/11/2017 Initial Diagnosis    Left breast biopsy 3:30 position 6 cm from nipple: IDC with DCIS, lymphovascular invasion present, grade 2-3, ER 0%, PR 0%, HER-2 positive ratio 2.28, Ki-67 20%, 1 cm lesion left breast, T1b N0 stage IA clinical stage      MEDICAL HISTORY:  Past Medical History:  Diagnosis Date  . Diabetes mellitus without complication ([place])     SURGICAL HISTORY: Past Surgical History:  Procedure Laterality Date  . TUBAL LIGATION      SOCIAL HISTORY: Social History   Social History  .  Marital status: Legally Separated    Spouse name: N/A  . Number of children: N/A  . Years of education: N/A   Occupational History  . Not on file.   Social History Main Topics  . Smoking status: Never Smoker  . Smokeless tobacco: Never Used  . Alcohol use No  . Drug use: No  . Sexual activity: No   Other Topics Concern  . Not on file   Social History Narrative  . No narrative on file    FAMILY HISTORY: Family History  Problem Relation Age of Onset  . Hypertension Mother   . Diabetes Father   . Hypertension Father   . Hypertension Sister   . Cancer Paternal Grandmother   . Diabetes Paternal Grandmother     ALLERGIES:  has No Known Allergies.  MEDICATIONS:  Current Outpatient Prescriptions  Medication Sig Dispense Refill  . aspirin 81 MG tablet Take 1 tablet (81 mg total) by mouth daily. 90 tablet 3  . atorvastatin (LIPITOR) 40 MG tablet Take 1 tablet (40 mg total) by mouth daily. 90 tablet 3  . Blood Glucose Monitoring Suppl (ACCU-CHEK AVIVA PLUS) W/DEVICE KIT Check Blood sugar AM fasting and before dinner 1 kit 0  . Elastic Bandages & Supports (LUMBAR BACK BRACE/SUPPORT PAD) MISC 1 each by Does not apply route daily. 1 each 0  . glucose blood (ACCU-CHEK AVIVA) test strip Use as instructed 100 each 12  . Lancet Devices (ACCU-CHEK SOFTCLIX) lancets Use as instructed 1 each 0  . metFORMIN (GLUCOPHAGE XR) 500 MG 24 hr tablet Take 2 tablets (1,000  mg total) by mouth daily with breakfast. 60 tablet 11  . polyethylene glycol powder (GLYCOLAX/MIRALAX) powder Take 17 g by mouth daily. 3350 g 1  . TRUEPLUS LANCETS 28G MISC USE AS DIRECTED BY PHYSICIAN 100 each 0   No current facility-administered medications for this visit.     REVIEW OF SYSTEMS:   Constitutional: Denies fevers, chills or abnormal night sweats Eyes: Denies blurriness of vision, double vision or watery eyes Ears, nose, mouth, throat, and face: Denies mucositis or sore throat Respiratory: Denies cough,  dyspnea or wheezes Cardiovascular: Denies palpitation, chest discomfort or lower extremity swelling Gastrointestinal:  Denies nausea, heartburn or change in bowel habits Skin: Denies abnormal skin rashes Lymphatics: Denies new lymphadenopathy or easy bruising Neurological:Denies numbness, tingling or new weaknesses Behavioral/Psych: Mood is stable, no new changes  Breast: Recent left breast biopsy All other systems were reviewed with the patient and are negative.  PHYSICAL EXAMINATION: ECOG PERFORMANCE STATUS: 1 - Symptomatic but completely ambulatory  Vitals:   05/19/17 0847  BP: (!) 159/81  Pulse: 85  Resp: 17  Temp: 98.1 F (36.7 C)  SpO2: 100%   Filed Weights   05/19/17 0847  Weight: 165 lb 8 oz (75.1 kg)    GENERAL:alert, no distress and comfortable SKIN: skin color, texture, turgor are normal, no rashes or significant lesions EYES: normal, conjunctiva are pink and non-injected, sclera clear OROPHARYNX:no exudate, no erythema and lips, buccal mucosa, and tongue normal  NECK: supple, thyroid normal size, non-tender, without nodularity LYMPH:  no palpable lymphadenopathy in the cervical, axillary or inguinal LUNGS: clear to auscultation and percussion with normal breathing effort HEART: regular rate & rhythm and no murmurs and no lower extremity edema ABDOMEN:abdomen soft, non-tender and normal bowel sounds Musculoskeletal:no cyanosis of digits and no clubbing  PSYCH: alert & oriented x 3 with fluent speech NEURO: no focal motor/sensory deficits BREAST: Bruising on the left breast where she had the biopsy. No palpable axillary or supraclavicular lymphadenopathy (exam performed in the presence of a chaperone)   LABORATORY DATA:  I have reviewed the data as listed Lab Results  Component Value Date   WBC 11.7 (H) 05/19/2017   HGB 10.8 (L) 05/19/2017   HCT 34.7 (L) 05/19/2017   MCV 85.9 05/19/2017   PLT 339 05/19/2017   Lab Results  Component Value Date   NA 139  05/19/2017   K 3.8 05/19/2017   CL 103 04/02/2017   CO2 26 05/19/2017    RADIOGRAPHIC STUDIES: I have personally reviewed the radiological reports and agreed with the findings in the report.  ASSESSMENT AND PLAN:  Malignant neoplasm of lower-outer quadrant of left breast of female, estrogen receptor negative ([place]) 05/11/2017 Left breast biopsy 3:30 position 6 cm from nipple: IDC with DCIS, lymphovascular invasion present, grade 2-3, ER 0%, PR 0%, HER-2 positive ratio 2.28, Ki-67 20%, 1 cm lesion left breast, T1b N0 stage IA clinical stage Patient is deaf and cannot talk.  Pathology and radiology counseling: Discussed with the patient, the details of pathology including the type of breast cancer,the clinical staging, the significance of ER, PR and HER-2/neu receptors and the implications for treatment. After reviewing the pathology in detail, we proceeded to discuss the different treatment options between surgery, radiation, chemotherapy, antiestrogen therapies.  Recommendation: 1. Breast conserving surgery 2. followed by adjuvant chemotherapy with Taxol Herceptin weekly 12 followed by Herceptin every 3 weeks for total of 6 months 3. Followed by adjuvant radiation  Chemotherapy counseling: I discussed this and benefits of chemotherapy  with Taxol and Herceptin including the risk of low blood counts and neuropathy from Taxol. Herceptin related cardiac toxicities were also discussed. Patient will need echocardiogram and chemotherapy class.  Return to clinic after surgery to finalize a treatment plan to   All questions were answered. The patient knows to call the clinic with any problems, questions or concerns.    Rulon Eisenmenger, MD 05/19/17

## 2017-05-19 NOTE — Assessment & Plan Note (Signed)
05/11/2017 Left breast biopsy 3:30 position 6 cm from nipple: IDC with DCIS, lymphovascular invasion present, grade 2-3, ER 0%, PR 0%, HER-2 positive ratio 2.28, Ki-67 20%, 1 cm lesion left breast, T1b N0 stage IA clinical stage Patient is deaf and cannot talk.  Pathology and radiology counseling: Discussed with the patient, the details of pathology including the type of breast cancer,the clinical staging, the significance of ER, PR and HER-2/neu receptors and the implications for treatment. After reviewing the pathology in detail, we proceeded to discuss the different treatment options between surgery, radiation, chemotherapy, antiestrogen therapies.  Recommendation: 1. Breast conserving surgery 2. followed by adjuvant chemotherapy with Taxol Herceptin weekly 12 followed by Herceptin every 3 weeks for total of 6 months 3. Followed by adjuvant radiation  Chemotherapy counseling: I discussed this and benefits of chemotherapy with Taxol and Herceptin including the risk of low blood counts and neuropathy from Taxol. Herceptin related cardiac toxicities were also discussed. Patient will need echocardiogram and chemotherapy class.  Return to clinic after surgery to finalize a treatment plan to

## 2017-05-19 NOTE — Progress Notes (Signed)
Radiation Oncology         [phone] ________________________________  Name: Erin [name]        MRN: [id number]  Date of Service: 05/19/2017 DOB: Sep 26, 1960  CC:Boykin Nearing, MD  Excell Seltzer, MD     REFERRING PHYSICIAN: Excell Seltzer, MD   DIAGNOSIS: There were no encounter diagnoses.   HISTORY OF PRESENT ILLNESS: Erin [name] is a 56 y.o. female seen in the multidisciplinary breast clinic for a new diagnosis of a left breast cancer. The patient was found to have assymmetry of the left breast seen on screening mammogram.Diagnostic imaging on 05/04/17 revealed a 10 x 8 mm lesion in the 3:30 position of the left breast. An ultrasound of the axilla was negative for adenopathy. 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 The tumor was ER/PR negative, and HER2 was amplified at 2.28, Ki 67 was 20%. She comes today to discuss options of treatment for her cancer.   PREVIOUS RADIATION THERAPY: No   PAST MEDICAL HISTORY:  Past Medical History:  Diagnosis Date  . Diabetes mellitus without complication ([place])        PAST SURGICAL HISTORY: Past Surgical History:  Procedure Laterality Date  . TUBAL LIGATION       FAMILY HISTORY:  Family History  Problem Relation Age of Onset  . Hypertension Mother   . Diabetes Father   . Hypertension Father   . Hypertension Sister   . Cancer Paternal Grandmother   . Diabetes Paternal Grandmother      SOCIAL HISTORY:  reports that she has never smoked. She has never used smokeless tobacco. She reports that she does not drink alcohol or use drugs. The patient is separated and lives in [place]. She is currently unemployed. 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   ALLERGIES: Patient has no known allergies.   MEDICATIONS:  Current Outpatient Prescriptions  Medication Sig Dispense Refill  . aspirin 81 MG tablet Take 1 tablet (81 mg total) by mouth daily.  90 tablet 3  . atorvastatin (LIPITOR) 40 MG tablet Take 1 tablet (40 mg total) by mouth daily. 90 tablet 3  . Blood Glucose Monitoring Suppl (ACCU-CHEK AVIVA PLUS) W/DEVICE KIT Check Blood sugar AM fasting and before dinner 1 kit 0  . Elastic Bandages & Supports (LUMBAR BACK BRACE/SUPPORT PAD) MISC 1 each by Does not apply route daily. 1 each 0  . glucose blood (ACCU-CHEK AVIVA) test strip Use as instructed 100 each 12  . Lancet Devices (ACCU-CHEK SOFTCLIX) lancets Use as instructed 1 each 0  . metFORMIN (GLUCOPHAGE XR) 500 MG 24 hr tablet Take 2 tablets (1,000 mg total) by mouth daily with breakfast. 60 tablet 11  . polyethylene glycol powder (GLYCOLAX/MIRALAX) powder Take 17 g by mouth daily. 3350 g 1  . TRUEPLUS LANCETS 28G MISC USE AS DIRECTED BY PHYSICIAN 100 each 0   No current facility-administered medications for this encounter.      REVIEW OF SYSTEMS: On review of systems, the patient reports that she is doing well overall. She denies any chest pain, but in the evening has noticed increasing night time shortness of breath that she has not been worked up or seen for. She denies any cough, fevers, chills, night sweats, unintended weight changes. She denies any bowel or bladder disturbances, and denies abdominal pain, nausea or vomiting. She occasionally has gassiness but otherwise denies GI symptoms. She denies any new musculoskeletal or joint aches  or pains. A complete review of systems is obtained and is otherwise negative.     PHYSICAL EXAM:  Wt Readings from Last 3 Encounters:  05/19/17 165 lb 8 oz (75.1 kg)  04/22/17 169 lb 6 oz (76.8 kg)  04/02/17 170 lb 12.8 oz (77.5 kg)   Temp Readings from Last 3 Encounters:  05/19/17 98.1 F (36.7 C) (Oral)  04/22/17 97.7 F (36.5 C) (Oral)  04/02/17 (!) 97.5 F (36.4 C) (Oral)   BP Readings from Last 3 Encounters:  05/19/17 (!) 159/81  04/22/17 (!) 142/79  04/02/17 139/82   Pulse Readings from Last 3 Encounters:  05/19/17 85    04/22/17 84  04/02/17 77     In general this is a well appearing African American female in no acute distress. She is alert and oriented x4 and appropriate throughout the examination. HEENT reveals that the patient is normocephalic, atraumatic. EOMs are intact. PERRLA. Skin is intact without any evidence of gross lesions. Cardiovascular exam reveals a regular rate and rhythm, no clicks rubs or murmurs are auscultated. Chest is clear to auscultation bilaterally. Lymphatic assessment is performed and does not reveal any adenopathy in the cervical, supraclavicular, axillary, or inguinal chains. Abdomen has active bowel sounds in all quadrants and is intact. Bilateral breast exam reveals a small area of ecchymosis inferior to the breast on the left. No palpable mass is noted otherwise and the right breast reveals no mass. No nipple bleeding or discharge is noted of either breast. The abdomen is soft, non tender, non distended. Lower extremities are negative for pretibial pitting edema, deep calf tenderness, cyanosis or clubbing.   ECOG = 0  0 - Asymptomatic (Fully active, able to carry on all predisease activities without restriction)  1 - Symptomatic but completely ambulatory (Restricted in physically strenuous activity but ambulatory and able to carry out work of a light or sedentary nature. For example, light housework, office work)  2 - Symptomatic, <50% in bed during the day (Ambulatory and capable of all self care but unable to carry out any work activities. Up and about more than 50% of waking hours)  3 - Symptomatic, >50% in bed, but not bedbound (Capable of only limited self-care, confined to bed or chair 50% or more of waking hours)  4 - Bedbound (Completely disabled. Cannot carry on any self-care. Totally confined to bed or chair)  5 - Death   Eustace Pen MM, Creech RH, Tormey DC, et al. [id number]). "Toxicity and response criteria of the [facility] Group". [place] Oncol. 5  (6): 649-55    LABORATORY DATA:  Lab Results  Component Value Date   WBC 11.7 (H) 05/19/2017   HGB 10.8 (L) 05/19/2017   HCT 34.7 (L) 05/19/2017   MCV 85.9 05/19/2017   PLT 339 05/19/2017   Lab Results  Component Value Date   NA 139 05/19/2017   K 3.8 05/19/2017   CL 103 04/02/2017   CO2 26 05/19/2017   Lab Results  Component Value Date   ALT 17 05/19/2017   AST 16 05/19/2017   ALKPHOS 78 05/19/2017   BILITOT <0.22 05/19/2017      RADIOGRAPHY: Ms Digital Screening Bilateral  Result Date: 04/23/2017 CLINICAL DATA:  Screening. EXAM: DIGITAL SCREENING BILATERAL MAMMOGRAM WITH CAD COMPARISON:  Previous exam(s). ACR Breast Density Category b: There are scattered areas of fibroglandular density. FINDINGS: In the left breast, a possible asymmetry warrants further evaluation. In the right breast, no findings suspicious for malignancy. Images were  processed with CAD. IMPRESSION: Further evaluation is suggested for possible asymmetry in the left breast. RECOMMENDATION: Diagnostic mammogram and possibly ultrasound of the left breast. (Code:[id number]) The patient will be contacted regarding the findings, and additional imaging will be scheduled. BI-RADS CATEGORY  0: Incomplete. Need additional imaging evaluation and/or prior mammograms for comparison. Electronically Signed   By: Ammie Ferrier M.D.   On: 04/23/2017 10:50   US Breast Ltd Uni Left Inc Axilla  Addendum Date: 05/14/2017   ADDENDUM REPORT: 05/14/2017 10:13 ADDENDUM: This is an addendum to the original report dictated on 05/04/2017. On ultrasound evaluation, the mass in the left breast at the 3:30 position 6 cm from the nipple measured 1.3 x 0.7 x 0.8 cm. The remainder of the findings, impression and recommendation are unchanged. Electronically Signed   By: Kristopher Oppenheim M.D.   On: 05/14/2017 10:13   Result Date: 05/14/2017 CLINICAL DATA:  56 year old female recalled from screening mammogram dated 04/22/2017 for a left breast  asymmetry. EXAM: 2D DIGITAL DIAGNOSTIC LEFT MAMMOGRAM WITH CAD AND ADJUNCT TOMO ULTRASOUND LEFT BREAST COMPARISON:  Previous exam(s). ACR Breast Density Category b: There are scattered areas of fibroglandular density. FINDINGS: A focal asymmetry persists on today's 2D/3D views in the central slightly lateral left breast at far posterior depth. Additional evaluation with ultrasound was performed. Mammographic images were processed with CAD. On physical exam, I palpate no physical abnormalities. Targeted ultrasound is performed, showing an irregular hypoechoic mass with some posterior acoustic shadowing and peripheral vascularity at the 3:30 position 6 cm from the nipple. This corresponds with the mammographic findings. Evaluation of the left axilla demonstrates multiple morphologically normal lymph nodes. IMPRESSION: 1. Suspicious left breast mass for which ultrasound-guided core biopsy is recommended. 2. No evidence for left axillary lymphadenopathy. RECOMMENDATION: Ultrasound-guided core biopsy of a left breast mass. This will be scheduled before the patient leaves the department. I have discussed the findings and recommendations with the patient. Results were also provided in writing at the conclusion of the visit. If applicable, a reminder letter will be sent to the patient regarding the next appointment. BI-RADS CATEGORY  4: Suspicious. Electronically Signed: By: Kristopher Oppenheim M.D. On: 05/04/2017 14:44   Mm Diag Breast Tomo Uni Left  Addendum Date: 05/14/2017   ADDENDUM REPORT: 05/14/2017 10:13 ADDENDUM: This is an addendum to the original report dictated on 05/04/2017. On ultrasound evaluation, the mass in the left breast at the 3:30 position 6 cm from the nipple measured 1.3 x 0.7 x 0.8 cm. The remainder of the findings, impression and recommendation are unchanged. Electronically Signed   By: Kristopher Oppenheim M.D.   On: 05/14/2017 10:13   Result Date: 05/14/2017 CLINICAL DATA:  56 year old female recalled  from screening mammogram dated 04/22/2017 for a left breast asymmetry. EXAM: 2D DIGITAL DIAGNOSTIC LEFT MAMMOGRAM WITH CAD AND ADJUNCT TOMO ULTRASOUND LEFT BREAST COMPARISON:  Previous exam(s). ACR Breast Density Category b: There are scattered areas of fibroglandular density. FINDINGS: A focal asymmetry persists on today's 2D/3D views in the central slightly lateral left breast at far posterior depth. Additional evaluation with ultrasound was performed. Mammographic images were processed with CAD. On physical exam, I palpate no physical abnormalities. Targeted ultrasound is performed, showing an irregular hypoechoic mass with some posterior acoustic shadowing and peripheral vascularity at the 3:30 position 6 cm from the nipple. This corresponds with the mammographic findings. Evaluation of the left axilla demonstrates multiple morphologically normal lymph nodes. IMPRESSION: 1. Suspicious left breast mass for which ultrasound-guided core biopsy is  recommended. 2. No evidence for left axillary lymphadenopathy. RECOMMENDATION: Ultrasound-guided core biopsy of a left breast mass. This will be scheduled before the patient leaves the department. I have discussed the findings and recommendations with the patient. Results were also provided in writing at the conclusion of the visit. If applicable, a reminder letter will be sent to the patient regarding the next appointment. BI-RADS CATEGORY  4: Suspicious. Electronically Signed: By: Kristopher Oppenheim M.D. On: 05/04/2017 14:44   Mm Clip Placement Left  Result Date: 05/11/2017 CLINICAL DATA:  Post clip films status post ultrasound-guided biopsy of a left breast mass. EXAM: DIAGNOSTIC LEFT MAMMOGRAM POST ULTRASOUND BIOPSY COMPARISON:  Previous exam(s). FINDINGS: Mammographic images were obtained following ultrasound guided biopsy of a left breast mass at the 3:30 position 6 cm from the nipple. A ribbon shaped clip is identified in the expected location in the central lateral  left breast at far posterior depth. IMPRESSION: Ribbon shaped clip in the expected location status post ultrasound-guided biopsy of the left breast. Final Assessment: Post Procedure Mammograms for Marker Placement Electronically Signed   By: Kristopher Oppenheim M.D.   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 This was found to be concordant by Dr. Kristopher Oppenheim. Pathology results were discussed with the patient's mother, Eugenie Birks, by telephone at the request of the patient. The patient did well after the biopsy. Post biopsy instructions and care were reviewed and questions were answered. The patient was encouraged to call [facility] for any additional concerns. The patient was referred to the [facility] at the [facility] on May 19, 2017. The patient is hearing impaired and an interpreter will be provided at this appointment. Pathology results reported by Susa Raring RN, BSN on 05/13/2017. Electronically Signed   By: Kristopher Oppenheim M.D.   On: 05/13/2017 08:14   Result Date: 05/13/2017 CLINICAL DATA:  56 year old female with a left breast mass. EXAM: ULTRASOUND GUIDED LEFT BREAST CORE NEEDLE BIOPSY COMPARISON:  Previous exam(s). FINDINGS: I met with the patient and we discussed the procedure of ultrasound-guided biopsy, including benefits and alternatives. We discussed the high likelihood of a successful procedure. We discussed the risks of the procedure, including infection, bleeding, tissue injury, clip migration, and inadequate sampling. Informed written consent was given. The usual time-out protocol was performed immediately prior to the procedure. Lesion quadrant: Lower outer quadrant  Using sterile technique and 1% Lidocaine as local anesthetic, under direct ultrasound visualization, a 12 gauge spring-loaded device was used to perform biopsy of left breast mass at the 3:30 position 6 cm from the nipple using a inferior approach. At the conclusion of the procedure a ribbon shaped tissue marker clip was deployed into the biopsy cavity. Follow up 2 view mammogram was performed and dictated separately. IMPRESSION: Ultrasound guided biopsy of a left breast mass. No apparent complications. Electronically Signed: By: Kristopher Oppenheim M.D. On: 05/11/2017 13:01       IMPRESSION/PLAN: 1. Stage IA, [id number] grade 2-3, ER/PR negative, HER2 amplified, invasive ductal carcinoma with DCIS of the left breast.Dr. Lisbeth Renshaw discusses the pathology findings and reviews the nature of invasive HER2 amplified breast disease. The consensus from the breast conference include breast conservation with lumpectomy  with sentinel mapping. She is a candidate for chemotherapy due to the HER2 amplification of her disease. Following chemotherapy, we would anticipate a course of external radiotherapy to the breast. We discussed the risks, benefits, short, and long term effects of radiotherapy, and the patient is interested in proceeding. Dr. Lisbeth Renshaw discusses the delivery and logistics of radiotherapy with 6 1/2 weeks of treamtent with deep inspiration breath hold technique. We will see her back about 3 weeks following completion of her chemotherapy, and anticipate continuation of Herceptin during radiotherapy. She is in agreement with this plan and we will see her at the appropriate time. 2. Shortness of breath at night. I will share this with Dr. Excell Seltzer as she likely needs preoperative medical clearance.  The above documentation reflects my direct findings during this shared patient visit. Please see the separate note by Dr. Lisbeth Renshaw on this date for the remainder of the patient's plan of care.    Carola Rhine, PAC

## 2017-05-19 NOTE — Patient Instructions (Signed)

## 2017-05-24 ENCOUNTER — Other Ambulatory Visit: Payer: Self-pay | Admitting: General Surgery

## 2017-05-24 ENCOUNTER — Ambulatory Visit (HOSPITAL_COMMUNITY)
Admission: RE | Admit: 2017-05-24 | Discharge: 2017-05-24 | Disposition: A | Discharge status: Home / Self Care | Payer: Medicaid Other | Source: Ambulatory Visit | Attending: Hematology and Oncology | Admitting: Hematology and Oncology

## 2017-05-24 DIAGNOSIS — Z01810 Encounter for preprocedural cardiovascular examination: Secondary | ICD-10-CM | POA: Insufficient documentation

## 2017-05-24 DIAGNOSIS — Z171 Estrogen receptor negative status [ER-]: Secondary | ICD-10-CM | POA: Insufficient documentation

## 2017-05-24 DIAGNOSIS — C50512 Malignant neoplasm of lower-outer quadrant of left female breast: Secondary | ICD-10-CM | POA: Diagnosis not present

## 2017-05-24 DIAGNOSIS — C50912 Malignant neoplasm of unspecified site of left female breast: Secondary | ICD-10-CM

## 2017-05-24 NOTE — Progress Notes (Signed)
  Echocardiogram 2D Echocardiogram has been performed.  Erin [name] 05/24/2017, 9:50 AM

## 2017-05-25 ENCOUNTER — Telehealth: Payer: Self-pay | Admitting: *Deleted

## 2017-05-25 NOTE — Telephone Encounter (Signed)
Spoke to pt mother regarding [facility]. Denies questions or concerns at this time. Scheduled and confirmed post op with Dr. [name] on 10/3 at 1115. Encourage to call with needs. Received verbal understanding Spoke to interpreting services to ensure sign language interpreter is scheduled for her appts.

## 2017-05-26 ENCOUNTER — Ambulatory Visit: Payer: Medicare Other | Attending: Internal Medicine | Admitting: Physician Assistant

## 2017-05-26 ENCOUNTER — Encounter: Payer: Self-pay | Admitting: Physician Assistant

## 2017-05-26 VITALS — BP 129/82 | HR 84 | Temp 97.9°F | Resp 18 | Ht 61.0 in | Wt 166.0 lb

## 2017-05-26 DIAGNOSIS — N926 Irregular menstruation, unspecified: Secondary | ICD-10-CM | POA: Diagnosis not present

## 2017-05-26 DIAGNOSIS — Z7984 Long term (current) use of oral hypoglycemic drugs: Secondary | ICD-10-CM | POA: Diagnosis not present

## 2017-05-26 DIAGNOSIS — Z7982 Long term (current) use of aspirin: Secondary | ICD-10-CM | POA: Insufficient documentation

## 2017-05-26 DIAGNOSIS — E118 Type 2 diabetes mellitus with unspecified complications: Secondary | ICD-10-CM | POA: Diagnosis not present

## 2017-05-26 DIAGNOSIS — Z1211 Encounter for screening for malignant neoplasm of colon: Secondary | ICD-10-CM

## 2017-05-26 LAB — GLUCOSE, POCT (MANUAL RESULT ENTRY): POC Glucose: 114 mg/dl — AB (ref 70–99)

## 2017-05-26 NOTE — Progress Notes (Signed)
Patient ID: Erin [name], female   DOB: March 14, 1961, 56 y.o.   MRN: [id number]   Erin [name], is a 56 y.o. female  BOF:[id number]  DPO:[id number]  DOB - Apr 17, 1961  Subjective:  Chief Complaint and HPI: Erin [name] is a 56 y.o. female here today for irregular menses.  For the past 2 weeks she has been having a period.  Started out light then heavy then light again and now has stopped.  She skipped periods for 2 months prior to this one.  Otherwise periods have been normal.    Scheduled for breast lumpectomy with sentinel node dissection later this month.  They have recommended she get UTD on colon CA screening.  Heme negative.  No change in BMs.  No melena/hematochezia. Feels fine overall.  ASL interpreter "Katie" used.  ROS:   Constitutional:  No f/c, No night sweats, No unexplained weight loss. EENT:  No vision changes, No blurry vision, No hearing changes. No mouth, throat, or ear problems.  Respiratory: No cough, No SOB Cardiac: No CP, no palpitations GI:  No abd pain, No N/V/D. GU: No Urinary s/sx Musculoskeletal: No joint pain Neuro: No headache, no dizziness, no motor weakness.  Skin: No rash Endocrine:  No polydipsia. No polyuria.  Psych: Denies SI/HI  No problems updated.  ALLERGIES: No Known Allergies  PAST MEDICAL HISTORY: Past Medical History:  Diagnosis Date  . Diabetes mellitus without complication ([place])     MEDICATIONS AT HOME: Prior to Admission medications   Medication Sig Start Date End Date Taking? Authorizing Provider  atorvastatin (LIPITOR) 40 MG tablet Take 1 tablet (40 mg total) by mouth daily. 04/05/17  Yes Funches, Josalyn, MD  Blood Glucose Monitoring Suppl (ACCU-CHEK AVIVA PLUS) W/DEVICE KIT Check Blood sugar AM fasting and before dinner 12/17/14  Yes Lance Bosch, NP  Elastic Bandages & Supports (LUMBAR BACK BRACE/SUPPORT PAD) MISC 1 each by Does not apply route daily. 07/01/16  Yes Funches, Josalyn, MD  glucose blood (ACCU-CHEK AVIVA) test  strip Use as instructed 12/17/14  Yes Lance Bosch, NP  Lancet Devices [facility]) lancets Use as instructed 12/17/14  Yes Lance Bosch, NP  metFORMIN (GLUCOPHAGE XR) 500 MG 24 hr tablet Take 2 tablets (1,000 mg total) by mouth daily with breakfast. 04/02/17  Yes Funches, Josalyn, MD  polyethylene glycol powder (GLYCOLAX/MIRALAX) powder Take 17 g by mouth daily. 04/02/17  Yes Boykin Nearing, MD  TRUEPLUS LANCETS 28G MISC USE AS DIRECTED BY PHYSICIAN 03/13/15  Yes Lance Bosch, NP  aspirin 81 MG tablet Take 1 tablet (81 mg total) by mouth daily. Patient not taking: Reported on 05/26/2017 04/05/17   Boykin Nearing, MD     Objective:  EXAM:   Vitals:   05/26/17 0850  BP: 129/82  Pulse: 84  Resp: 18  Temp: 97.9 F (36.6 C)  TempSrc: Oral  SpO2: 99%  Weight: 166 lb (75.3 kg)  Height: 5' 1"  (1.549 m)    General appearance : A&OX3. NAD. Non-toxic-appearing HEENT: Atraumatic and Normocephalic.  PERRLA. EOM intact.   Neck: supple, no JVD. No cervical lymphadenopathy. No thyromegaly Chest/Lungs:  Breathing-non-labored, Good air entry bilaterally, breath sounds normal without rales, rhonchi, or wheezing  CVS: S1 S2 regular, no murmurs, gallops, rubs  Extremities: Bilateral Lower Ext shows no edema, both legs are warm to touch with = pulse throughout Neurology:  CN II-XII grossly intact, Non focal.   Psych:  TP linear. J/I WNL. Normal speech. Appropriate eye contact and affect.  Skin:  No Rash  Data Review Lab Results  Component Value Date   HGBA1C 6.6 04/02/2017   HGBA1C 6.7 12/29/2016   HGBA1C 6.6 07/01/2016     Assessment & Plan   1. Type 2 diabetes mellitus with complication, without long-term current use of insulin (HCC) Adequate control-continue current regimen.  - Glucose (CBG)  2. Irregular bleeding Likely menopausal changes - FSH/LH - TSH - CBC with Differential/Platelet  3. Colon cancer screening Past due for age and needs in screening for other  CA - Ambulatory referral to Gastroenterology  Patient have been counseled extensively about nutrition and exercise  Return in about 4 weeks (around 06/22/2017) for keep appt with [facility] to establish care.  The patient was given clear instructions to go to ER or return to medical center if symptoms don't improve, worsen or new problems develop. The patient verbalized understanding. The patient was told to call to get lab results if they haven't heard anything in the next week.     Freeman Caldron, PA-C [facility] [place], [place]   05/26/2017, 9:03 AM

## 2017-05-26 NOTE — Patient Instructions (Signed)
Menopause Menopause is the normal time of life when menstrual periods stop completely. Menopause is complete when you have missed 12 consecutive menstrual periods. It usually occurs between the ages of 48 years and 55 years. Very rarely does a woman develop menopause before the age of 40 years. At menopause, your ovaries stop producing the female hormones estrogen and progesterone. This can cause undesirable symptoms and also affect your health. Sometimes the symptoms may occur 4-5 years before the menopause begins. There is no relationship between menopause and:  Oral contraceptives.  Number of children you had.  Race.  The age your menstrual periods started (menarche).  Heavy smokers and very thin women may develop menopause earlier in life. What are the causes?  The ovaries stop producing the female hormones estrogen and progesterone. Other causes include:  Surgery to remove both ovaries.  The ovaries stop functioning for no known reason.  Tumors of the pituitary gland in the brain.  Medical disease that affects the ovaries and hormone production.  Radiation treatment to the abdomen or pelvis.  Chemotherapy that affects the ovaries.  What are the signs or symptoms?  Hot flashes.  Night sweats.  Decrease in sex drive.  Vaginal dryness and thinning of the vagina causing painful intercourse.  Dryness of the skin and developing wrinkles.  Headaches.  Tiredness.  Irritability.  Memory problems.  Weight gain.  Bladder infections.  Hair growth of the face and chest.  Infertility. More serious symptoms include:  Loss of bone (osteoporosis) causing breaks (fractures).  Depression.  Hardening and narrowing of the arteries (atherosclerosis) causing heart attacks and strokes.  How is this diagnosed?  When the menstrual periods have stopped for 12 straight months.  Physical exam.  Hormone studies of the blood. How is this treated? There are many treatment  choices and nearly as many questions about them. The decisions to treat or not to treat menopausal changes is an individual choice made with your health care provider. Your health care provider can discuss the treatments with you. Together, you can decide which treatment will work best for you. Your treatment choices may include:  Hormone therapy (estrogen and progesterone).  Non-hormonal medicines.  Treating the individual symptoms with medicine (for example antidepressants for depression).  Herbal medicines that may help specific symptoms.  Counseling by a psychiatrist or psychologist.  Group therapy.  Lifestyle changes including: ? Eating healthy. ? Regular exercise. ? Limiting caffeine and alcohol. ? Stress management and meditation.  No treatment.  Follow these instructions at home:  Take the medicine your health care provider gives you as directed.  Get plenty of sleep and rest.  Exercise regularly.  Eat a diet that contains calcium (good for the bones) and soy products (acts like estrogen hormone).  Avoid alcoholic beverages.  Do not smoke.  If you have hot flashes, dress in layers.  Take supplements, calcium, and vitamin D to strengthen bones.  You can use over-the-counter lubricants or moisturizers for vaginal dryness.  Group therapy is sometimes very helpful.  Acupuncture may be helpful in some cases. Contact a health care provider if:  You are not sure you are in menopause.  You are having menopausal symptoms and need advice and treatment.  You are still having menstrual periods after age 55 years.  You have pain with intercourse.  Menopause is complete (no menstrual period for 12 months) and you develop vaginal bleeding.  You need a referral to a specialist (gynecologist, psychiatrist, or psychologist) for treatment. Get help right   away if:  You have severe depression.  You have excessive vaginal bleeding.  You fell and think you have a  broken bone.  You have pain when you urinate.  You develop leg or chest pain.  You have a fast pounding heart beat (palpitations).  You have severe headaches.  You develop vision problems.  You feel a lump in your breast.  You have abdominal pain or severe indigestion. This information is not intended to replace advice given to you by your health care provider. Make sure you discuss any questions you have with your health care provider. Document Released: 11/21/2003 Document Revised: 02/06/2016 Document Reviewed: 03/30/2013 Elsevier Interactive Patient Education  2017 [employer]  

## 2017-05-27 ENCOUNTER — Telehealth: Payer: Self-pay | Admitting: *Deleted

## 2017-05-27 ENCOUNTER — Other Ambulatory Visit: Payer: Self-pay | Admitting: Physician Assistant

## 2017-05-27 LAB — CBC WITH DIFFERENTIAL/PLATELET
BASOS ABS: 0 10*3/uL (ref 0.0–0.2)
BASOS: 0 %
EOS (ABSOLUTE): 0.1 10*3/uL (ref 0.0–0.4)
Eos: 1 %
Hematocrit: 32.5 % — ABNORMAL LOW (ref 34.0–46.6)
Hemoglobin: 10.1 g/dL — ABNORMAL LOW (ref 11.1–15.9)
Immature Grans (Abs): 0 10*3/uL (ref 0.0–0.1)
Immature Granulocytes: 0 %
LYMPHS ABS: 3.8 10*3/uL — AB (ref 0.7–3.1)
LYMPHS: 36 %
MCH: 26.6 pg (ref 26.6–33.0)
MCHC: 31.1 g/dL — AB (ref 31.5–35.7)
MCV: 86 fL (ref 79–97)
Monocytes Absolute: 0.7 10*3/uL (ref 0.1–0.9)
Monocytes: 7 %
NEUTROS ABS: 6 10*3/uL (ref 1.4–7.0)
Neutrophils: 56 %
PLATELETS: 375 10*3/uL (ref 150–379)
RBC: 3.8 x10E6/uL (ref 3.77–5.28)
RDW: 16.6 % — AB (ref 12.3–15.4)
WBC: 10.6 10*3/uL (ref 3.4–10.8)

## 2017-05-27 LAB — FSH/LH
FSH: 5.5 m[IU]/mL
LH: 3.3 m[IU]/mL

## 2017-05-27 LAB — TSH: TSH: 2.33 u[IU]/mL (ref 0.450–4.500)

## 2017-05-27 MED ORDER — FERROUS SULFATE 325 (65 FE) MG PO TABS: 325.0000 mg | ORAL_TABLET | Freq: Every day | ORAL | 3 refills | Status: DC

## 2017-05-27 MED FILL — FERROUS SULFATE 325 MG TAB: 325 (65 FE) | 30 days supply | Qty: 30 | Fill #0

## 2017-05-27 NOTE — Telephone Encounter (Signed)
-----   Message from Argentina Donovan, [place] sent at 05/27/2017  8:30 AM EDT ----- Please call patient.  Her hormone levels do not look menopausal yet but her periods may remain irregular as her hormones begin to change.  We will keep an eye on this.  Her hemoglobin is a little bit low.  I sent an iron prescription to the pharmacy for this.  Thanks, Freeman Caldron, PA-C

## 2017-05-27 NOTE — Telephone Encounter (Signed)
Patient's mother verified DOB Patient's mother is aware of patient not being menopausal and this being monitored. Patient is also aware of iron supplement being sent to the pharmacy due to hemoglobin level being low. No further questions at this time.

## 2017-06-03 ENCOUNTER — Encounter (HOSPITAL_BASED_OUTPATIENT_CLINIC_OR_DEPARTMENT_OTHER): Payer: Self-pay | Admitting: *Deleted

## 2017-06-03 NOTE — Pre-Procedure Instructions (Signed)
Discussed pt's complaint of SOB with ADLs and at night with Dr. Lissa Hoard; if EKG is OK, pt. should be OK for surgery.

## 2017-06-03 NOTE — Pre-Procedure Instructions (Signed)
To come for EKG and to pick up Ensure pre-surgery drink - to drink by 0545 DOS.

## 2017-06-04 ENCOUNTER — Encounter (HOSPITAL_BASED_OUTPATIENT_CLINIC_OR_DEPARTMENT_OTHER)
Admission: RE | Admit: 2017-06-04 | Discharge: 2017-06-04 | Disposition: A | Discharge status: Home / Self Care | Payer: Medicaid Other | Source: Ambulatory Visit | Attending: General Surgery | Admitting: General Surgery

## 2017-06-04 DIAGNOSIS — E119 Type 2 diabetes mellitus without complications: Secondary | ICD-10-CM | POA: Insufficient documentation

## 2017-06-04 NOTE — Progress Notes (Signed)
EKG completed, Ensure pre surgery drink given with instructions to complete by 0545, hibiclens soap given with instructions.

## 2017-06-08 ENCOUNTER — Ambulatory Visit
Admission: RE | Admit: 2017-06-08 | Discharge: 2017-06-08 | Disposition: A | Discharge status: Home / Self Care | Payer: Self-pay | Source: Ambulatory Visit | Attending: General Surgery | Admitting: General Surgery

## 2017-06-08 DIAGNOSIS — C50912 Malignant neoplasm of unspecified site of left female breast: Secondary | ICD-10-CM

## 2017-06-09 ENCOUNTER — Encounter (HOSPITAL_COMMUNITY)
Admission: RE | Admit: 2017-06-09 | Discharge: 2017-06-09 | Disposition: A | Discharge status: Home / Self Care | Payer: Medicaid Other | Source: Ambulatory Visit | Attending: General Surgery | Admitting: General Surgery

## 2017-06-09 ENCOUNTER — Encounter (HOSPITAL_BASED_OUTPATIENT_CLINIC_OR_DEPARTMENT_OTHER): Payer: Self-pay | Admitting: Anesthesiology

## 2017-06-09 ENCOUNTER — Ambulatory Visit (HOSPITAL_BASED_OUTPATIENT_CLINIC_OR_DEPARTMENT_OTHER): Payer: Medicaid Other | Admitting: Anesthesiology

## 2017-06-09 ENCOUNTER — Ambulatory Visit
Admission: RE | Admit: 2017-06-09 | Discharge: 2017-06-09 | Disposition: A | Discharge status: Home / Self Care | Payer: Self-pay | Source: Ambulatory Visit | Attending: General Surgery | Admitting: General Surgery

## 2017-06-09 ENCOUNTER — Encounter (HOSPITAL_BASED_OUTPATIENT_CLINIC_OR_DEPARTMENT_OTHER)
Admission: RE | Disposition: A | Discharge status: Home / Self Care | Payer: Self-pay | Source: Ambulatory Visit | Attending: General Surgery

## 2017-06-09 ENCOUNTER — Ambulatory Visit (HOSPITAL_BASED_OUTPATIENT_CLINIC_OR_DEPARTMENT_OTHER)
Admission: RE | Admit: 2017-06-09 | Discharge: 2017-06-09 | Disposition: A | Discharge status: Home / Self Care | Payer: Medicaid Other | Source: Ambulatory Visit | Attending: General Surgery | Admitting: General Surgery

## 2017-06-09 ENCOUNTER — Ambulatory Visit (HOSPITAL_COMMUNITY): Payer: Medicaid Other

## 2017-06-09 DIAGNOSIS — E669 Obesity, unspecified: Secondary | ICD-10-CM | POA: Insufficient documentation

## 2017-06-09 DIAGNOSIS — F329 Major depressive disorder, single episode, unspecified: Secondary | ICD-10-CM | POA: Diagnosis not present

## 2017-06-09 DIAGNOSIS — C50512 Malignant neoplasm of lower-outer quadrant of left female breast: Secondary | ICD-10-CM | POA: Insufficient documentation

## 2017-06-09 DIAGNOSIS — E119 Type 2 diabetes mellitus without complications: Secondary | ICD-10-CM | POA: Insufficient documentation

## 2017-06-09 DIAGNOSIS — E785 Hyperlipidemia, unspecified: Secondary | ICD-10-CM | POA: Diagnosis not present

## 2017-06-09 DIAGNOSIS — C50912 Malignant neoplasm of unspecified site of left female breast: Secondary | ICD-10-CM | POA: Diagnosis present

## 2017-06-09 DIAGNOSIS — Z95828 Presence of other vascular implants and grafts: Secondary | ICD-10-CM

## 2017-06-09 HISTORY — DX: Malignant neoplasm of unspecified site of unspecified female breast: C50.919

## 2017-06-09 HISTORY — DX: Pure hypercholesterolemia, unspecified: E78.00

## 2017-06-09 HISTORY — PX: PORTACATH PLACEMENT: SHX2246

## 2017-06-09 HISTORY — DX: Other specified symptoms and signs involving the circulatory and respiratory systems: R09.89

## 2017-06-09 HISTORY — DX: Cough: R05

## 2017-06-09 HISTORY — DX: Type 2 diabetes mellitus without complications: E11.9

## 2017-06-09 HISTORY — PX: BREAST LUMPECTOMY WITH RADIOACTIVE SEED AND SENTINEL LYMPH NODE BIOPSY: SHX6550

## 2017-06-09 HISTORY — DX: Unspecified hearing loss, unspecified ear: H91.90

## 2017-06-09 HISTORY — DX: Dyspnea, unspecified: R06.00

## 2017-06-09 LAB — GLUCOSE, CAPILLARY
GLUCOSE-CAPILLARY: 176 mg/dL — AB (ref 65–99)
Glucose-Capillary: 138 mg/dL — ABNORMAL HIGH (ref 65–99)

## 2017-06-09 SURGERY — BREAST LUMPECTOMY WITH RADIOACTIVE SEED AND SENTINEL LYMPH NODE BIOPSY
Anesthesia: Regional | Site: Chest | Laterality: Right

## 2017-06-09 MED ORDER — HEPARIN SOD (PORK) LOCK FLUSH 100 UNIT/ML IV SOLN
INTRAVENOUS | Status: DC | PRN
  Administered 2017-06-09: 400 [IU]

## 2017-06-09 MED ORDER — HEPARIN (PORCINE) IN NACL 2-0.9 UNIT/ML-% IJ SOLN
INTRAMUSCULAR | Status: AC | PRN
Start: 2017-06-09 — End: 2017-06-09
  Administered 2017-06-09: 1

## 2017-06-09 MED ORDER — ONDANSETRON HCL 4 MG/2ML IJ SOLN
INTRAMUSCULAR | Status: AC
  Filled 2017-06-09: qty 2

## 2017-06-09 MED ORDER — FENTANYL CITRATE (PF) 100 MCG/2ML IJ SOLN
INTRAMUSCULAR | Status: AC
  Filled 2017-06-09: qty 2

## 2017-06-09 MED ORDER — ACETAMINOPHEN 500 MG PO TABS
ORAL_TABLET | ORAL | Status: AC
  Filled 2017-06-09: qty 2

## 2017-06-09 MED ORDER — CELECOXIB 200 MG PO CAPS
ORAL_CAPSULE | ORAL | Status: AC
  Filled 2017-06-09: qty 2

## 2017-06-09 MED ORDER — FENTANYL CITRATE (PF) 100 MCG/2ML IJ SOLN: 25.0000 ug | INTRAMUSCULAR | Status: DC | PRN

## 2017-06-09 MED ORDER — FENTANYL CITRATE (PF) 100 MCG/2ML IJ SOLN
50.0000 ug | INTRAMUSCULAR | Status: DC | PRN
  Administered 2017-06-09 (×2): 50 ug via INTRAVENOUS

## 2017-06-09 MED ORDER — TECHNETIUM TC 99M SULFUR COLLOID FILTERED
1.0000 | Freq: Once | INTRAVENOUS | Status: AC | PRN
  Administered 2017-06-09: 1 via INTRADERMAL

## 2017-06-09 MED ORDER — CHLORHEXIDINE GLUCONATE CLOTH 2 % EX PADS: 6.0000 | MEDICATED_PAD | Freq: Once | CUTANEOUS | Status: DC

## 2017-06-09 MED ORDER — ONDANSETRON HCL 4 MG/2ML IJ SOLN
INTRAMUSCULAR | Status: DC | PRN
  Administered 2017-06-09: 4 mg via INTRAVENOUS

## 2017-06-09 MED ORDER — LIDOCAINE HCL (CARDIAC) 20 MG/ML IV SOLN
INTRAVENOUS | Status: DC | PRN
  Administered 2017-06-09: 30 mg via INTRAVENOUS

## 2017-06-09 MED ORDER — BUPIVACAINE-EPINEPHRINE 0.25% -1:200000 IJ SOLN
INTRAMUSCULAR | Status: DC | PRN
  Administered 2017-06-09: 22 mL

## 2017-06-09 MED ORDER — ROPIVACAINE HCL 7.5 MG/ML IJ SOLN
INTRAMUSCULAR | Status: DC | PRN
  Administered 2017-06-09: 20 mL via PERINEURAL

## 2017-06-09 MED ORDER — DEXAMETHASONE SODIUM PHOSPHATE 10 MG/ML IJ SOLN
INTRAMUSCULAR | Status: AC
  Filled 2017-06-09: qty 1

## 2017-06-09 MED ORDER — GABAPENTIN 300 MG PO CAPS
ORAL_CAPSULE | ORAL | Status: AC
  Filled 2017-06-09: qty 1

## 2017-06-09 MED ORDER — CELECOXIB 400 MG PO CAPS
400.0000 mg | ORAL_CAPSULE | ORAL | Status: AC
  Administered 2017-06-09: 400 mg via ORAL

## 2017-06-09 MED ORDER — PHENYLEPHRINE HCL 10 MG/ML IJ SOLN
INTRAMUSCULAR | Status: DC | PRN
  Administered 2017-06-09 (×3): 80 ug via INTRAVENOUS

## 2017-06-09 MED ORDER — CEFAZOLIN SODIUM-DEXTROSE 2-4 GM/100ML-% IV SOLN
INTRAVENOUS | Status: AC
  Filled 2017-06-09: qty 100

## 2017-06-09 MED ORDER — PROPOFOL 10 MG/ML IV BOLUS
INTRAVENOUS | Status: DC | PRN
  Administered 2017-06-09: 150 mg via INTRAVENOUS

## 2017-06-09 MED ORDER — LACTATED RINGERS IV SOLN
INTRAVENOUS | Status: DC
  Administered 2017-06-09 (×3): via INTRAVENOUS

## 2017-06-09 MED ORDER — CEFAZOLIN SODIUM-DEXTROSE 2-4 GM/100ML-% IV SOLN
2.0000 g | INTRAVENOUS | Status: AC
  Administered 2017-06-09: 2 g via INTRAVENOUS

## 2017-06-09 MED ORDER — FENTANYL CITRATE (PF) 100 MCG/2ML IJ SOLN
INTRAMUSCULAR | Status: DC | PRN
  Administered 2017-06-09 (×3): 25 ug via INTRAVENOUS
  Administered 2017-06-09: 100 ug via INTRAVENOUS
  Administered 2017-06-09 (×5): 25 ug via INTRAVENOUS

## 2017-06-09 MED ORDER — ACETAMINOPHEN 500 MG PO TABS
1000.0000 mg | ORAL_TABLET | ORAL | Status: AC
  Administered 2017-06-09: 1000 mg via ORAL

## 2017-06-09 MED ORDER — MIDAZOLAM HCL 2 MG/2ML IJ SOLN
INTRAMUSCULAR | Status: AC
  Filled 2017-06-09: qty 2

## 2017-06-09 MED ORDER — MIDAZOLAM HCL 2 MG/2ML IJ SOLN: 1.0000 mg | INTRAMUSCULAR | Status: DC | PRN

## 2017-06-09 MED ORDER — MIDAZOLAM HCL 5 MG/5ML IJ SOLN
INTRAMUSCULAR | Status: DC | PRN
  Administered 2017-06-09: 2 mg via INTRAVENOUS

## 2017-06-09 MED ORDER — DEXAMETHASONE SODIUM PHOSPHATE 4 MG/ML IJ SOLN
INTRAMUSCULAR | Status: DC | PRN
  Administered 2017-06-09: 10 mg via INTRAVENOUS

## 2017-06-09 MED ORDER — OXYCODONE HCL 5 MG PO TABS: 5.0000 mg | ORAL_TABLET | Freq: Four times a day (QID) | ORAL | 0 refills | Status: DC | PRN

## 2017-06-09 MED ORDER — PROPOFOL 10 MG/ML IV BOLUS
INTRAVENOUS | Status: AC
  Filled 2017-06-09: qty 20

## 2017-06-09 MED ORDER — ONDANSETRON HCL 4 MG/2ML IJ SOLN: 4.0000 mg | Freq: Once | INTRAMUSCULAR | Status: DC | PRN

## 2017-06-09 MED ORDER — SCOPOLAMINE 1 MG/3DAYS TD PT72: 1.0000 | MEDICATED_PATCH | Freq: Once | TRANSDERMAL | Status: DC | PRN

## 2017-06-09 MED ORDER — GABAPENTIN 300 MG PO CAPS
300.0000 mg | ORAL_CAPSULE | ORAL | Status: AC
  Administered 2017-06-09: 300 mg via ORAL

## 2017-06-09 MED ORDER — LIDOCAINE 2% (20 MG/ML) 5 ML SYRINGE
INTRAMUSCULAR | Status: AC
  Filled 2017-06-09: qty 5

## 2017-06-09 SURGICAL SUPPLY — 65 items
ADH SKN CLS APL DERMABOND .7 (GAUZE/BANDAGES/DRESSINGS) ×2
BAG DECANTER FOR FLEXI CONT (MISCELLANEOUS) ×3 IMPLANT
BINDER BREAST XLRG (GAUZE/BANDAGES/DRESSINGS) IMPLANT
BLADE SURG 15 STRL LF DISP TIS (BLADE) ×2 IMPLANT
BLADE SURG 15 STRL SS (BLADE) ×6
CANISTER SUC SOCK COL 7IN (MISCELLANEOUS) IMPLANT
CANISTER SUCT 1200ML W/VALVE (MISCELLANEOUS) IMPLANT
CHLORAPREP W/TINT 26ML (MISCELLANEOUS) ×4 IMPLANT
CLIP VESOCCLUDE SM WIDE 6/CT (CLIP) ×1 IMPLANT
COVER BACK TABLE 60X90IN (DRAPES) ×3 IMPLANT
COVER MAYO STAND STRL (DRAPES) ×3 IMPLANT
COVER PROBE 5X48 (MISCELLANEOUS) ×3
COVER PROBE W GEL 5X96 (DRAPES) ×3 IMPLANT
DECANTER SPIKE VIAL GLASS SM (MISCELLANEOUS) IMPLANT
DERMABOND ADVANCED (GAUZE/BANDAGES/DRESSINGS) ×1
DERMABOND ADVANCED .7 DNX12 (GAUZE/BANDAGES/DRESSINGS) ×2 IMPLANT
DEVICE DUBIN W/COMP PLATE 8390 (MISCELLANEOUS) ×3 IMPLANT
DRAPE C-ARM 42X72 X-RAY (DRAPES) ×3 IMPLANT
DRAPE LAPAROSCOPIC ABDOMINAL (DRAPES) ×3 IMPLANT
DRAPE LAPAROTOMY 100X72 PEDS (DRAPES) ×1 IMPLANT
DRAPE LAPAROTOMY T 102X78X121 (DRAPES) ×2 IMPLANT
DRAPE UTILITY XL STRL (DRAPES) ×4 IMPLANT
DRSG PAD ABDOMINAL 8X10 ST (GAUZE/BANDAGES/DRESSINGS) ×1 IMPLANT
ELECT COATED BLADE 2.86 ST (ELECTRODE) ×3 IMPLANT
ELECT REM PT RETURN 9FT ADLT (ELECTROSURGICAL) ×3
ELECTRODE REM PT RTRN 9FT ADLT (ELECTROSURGICAL) ×2 IMPLANT
GLOVE BIO SURGEON STRL SZ 6.5 (GLOVE) ×2 IMPLANT
GLOVE BIOGEL PI IND STRL 7.0 (GLOVE) IMPLANT
GLOVE BIOGEL PI IND STRL 8 (GLOVE) ×2 IMPLANT
GLOVE BIOGEL PI INDICATOR 7.0 (GLOVE) ×1
GLOVE BIOGEL PI INDICATOR 8 (GLOVE) ×1
GLOVE ECLIPSE 7.5 STRL STRAW (GLOVE) ×4 IMPLANT
GOWN STRL REUS W/ TWL LRG LVL3 (GOWN DISPOSABLE) ×2 IMPLANT
GOWN STRL REUS W/ TWL XL LVL3 (GOWN DISPOSABLE) ×2 IMPLANT
GOWN STRL REUS W/TWL LRG LVL3 (GOWN DISPOSABLE) ×3
GOWN STRL REUS W/TWL XL LVL3 (GOWN DISPOSABLE) ×3
ILLUMINATOR WAVEGUIDE N/F (MISCELLANEOUS) IMPLANT
IV CATH PLACEMENT UNIT 16 GA (IV SOLUTION) IMPLANT
IV KIT MINILOC 20X1 SAFETY (NEEDLE) IMPLANT
KIT CVR 48X5XPRB PLUP LF (MISCELLANEOUS) IMPLANT
KIT MARKER MARGIN INK (KITS) ×3 IMPLANT
KIT PORT POWER 8FR ISP CVUE (Miscellaneous) ×3 IMPLANT
NDL HYPO 25X1 1.5 SAFETY (NEEDLE) ×4 IMPLANT
NDL SAFETY ECLIPSE 18X1.5 (NEEDLE) ×2 IMPLANT
NEEDLE HYPO 18GX1.5 SHARP (NEEDLE)
NEEDLE HYPO 22GX1.5 SAFETY (NEEDLE) ×3 IMPLANT
NEEDLE HYPO 25X1 1.5 SAFETY (NEEDLE) ×3 IMPLANT
NS IRRIG 1000ML POUR BTL (IV SOLUTION) ×1 IMPLANT
PACK BASIN DAY SURGERY FS (CUSTOM PROCEDURE TRAY) ×3 IMPLANT
PENCIL BUTTON HOLSTER BLD 10FT (ELECTRODE) ×3 IMPLANT
SET SHEATH INTRODUCER 10FR (MISCELLANEOUS) IMPLANT
SHEATH COOK PEEL AWAY SET 9F (SHEATH) IMPLANT
SLEEVE SCD COMPRESS KNEE MED (MISCELLANEOUS) ×3 IMPLANT
SPONGE LAP 4X18 X RAY DECT (DISPOSABLE) ×4 IMPLANT
SUT MON AB 4-0 PC3 18 (SUTURE) ×3 IMPLANT
SUT MON AB 5-0 PS2 18 (SUTURE) ×3 IMPLANT
SUT PROLENE 2 0 CT2 30 (SUTURE) ×3 IMPLANT
SUT SILK 4 0 TIES 17X18 (SUTURE) IMPLANT
SUT VICRYL 3-0 CR8 SH (SUTURE) ×3 IMPLANT
SYR 5ML LUER SLIP (SYRINGE) ×3 IMPLANT
SYR CONTROL 10ML LL (SYRINGE) ×6 IMPLANT
TOWEL OR 17X24 6PK STRL BLUE (TOWEL DISPOSABLE) ×6 IMPLANT
TOWEL OR NON WOVEN STRL DISP B (DISPOSABLE) ×3 IMPLANT
TUBE CONNECTING 20X1/4 (TUBING) ×1 IMPLANT
YANKAUER SUCT BULB TIP NO VENT (SUCTIONS) ×1 IMPLANT

## 2017-06-09 NOTE — Op Note (Signed)
Preoperative Diagnosis: LEFT BREAST CANCER  Postoprative Diagnosis: LEFT BREAST CANCER  Procedure: Procedure(s): BREAST LUMPECTOMY WITH RADIOACTIVE SEED AND DEEP AXILLARY SENTINEL LYMPH NODE BIOPSY INSERTION PORT-A-CATH WITH [place]   Surgeon: Excell Seltzer T   Assistants: None  Anesthesia:  General LMA anesthesia  Indications: 56 year old female with a new diagnosis of cancer of the left breast, lower outer quadrant. Clinical stage 1 a, ER negative, PR negative, HER-2 positive. After extensive preoperative workup and discussion regarding indications and risks detailed elsewhere we have elected to proceed with radioactive seed localized left breast lumpectomy and axillary sentinel lymph node biopsy with placement of Port-A-Cath and she will require adjuvant chemotherapy.    Procedure Detail:  Patient was brought to the operating room, placed in the supine position on the operating table, and general LMA anesthesia induced. She was carefully positioned with her left arm extended and the entire left breast, axilla, and upper arm were widely sterilely prepped and draped. She received preoperative IV antibiotics. PAS were in place. Patient timeout was performed and correct procedure verified.  The neoprobe was used to localize the seed in the lower outer left breast. I used a circumareolar incision and dissection was carried down through the subcutaneous and breast tissue toward the seed using the neoprobe for guidance. The mass was posterior in the breast. As the seed was approached the dissection was widened out in all directions using the neoprobe for guidance and a globular specimen of breast tissue measuring about 4 cm in diameter was excised around the seed. The specimen was inked for margins. There was a small palpable mass which seemed a little closer to the superior margin than other margins. Specimen x-ray showed the seed and marking clip contained within the specimen, also appearing a little  closer to the superior margin. I then completely excised the superior margin of the lumpectomy back at least 5 mm and this was sent as a separate oriented specimen. Complete hemostasis was obtained. Soft tissue infiltrated with Marcaine. The lumpectomy cavity was marked with clips. The deep breast and subcutaneous tissue was closed with interrupted 3-0 Vicryl.  Attention was turned to the sentinel node. The neoprobe was used to localize a hot area in the left axilla and a small transverse incision made. Dissection was carried down through the subcutaneous tissue. The clavipectoral fascia was incised. There were significant elevated counts within the axilla. Using the neoprobe for guidance I dissected down onto a deep axillary sentinel lymph node normal sized which was excised and had counts in the range of 900. There were still elevated counts in dissecting more anteriorly a second normal-appearing deep axillary node was excised that had counts of almost 6000. There was still elevated counts in the axilla and I was able to localize and dissect the third and fourth deep axillary node with counts of about 107 100 respectively. At this point there was no significant activity in the axilla and I could not feel any abnormal lymphadenopathy. Hemostasis was assured and the deep axillary and subcutaneous tissue closed with interrupted 3-0 Vicryl. The skin of both incisions was closed with running subcuticular 5-0 Monocryl and Dermabond. Sponge needle and instrument counts were correct.  The patient was reprepped and draped in preparation for cath placement. Ultrasound was used to identify the right internal jugular vein which was compressible. Under direct continuous ultrasound guidance the right internal jugular vein was cannulated with a needle and guidewire confirmed by fluoroscopy. The introducer was placed via the guidewire and  the flushed catheter placed via the introducer which was stripped away and the tip of  the catheter positioned in the superior vena cava confirmed by fluoroscopy. The incision was infiltrated with Marcaine and a small transverse incision made in the right anterior chest and subcutaneous pocket created. The catheter was tunneled subcutaneously to the pocket, trimmed to length and attached to the port which was sutured to the anterior chest wall with interrupted Prolene. Fluoroscopy showed again the catheter in good position. The subcutaneous tissue was closed with interrupted 3-0 Vicryl and skin closed with subcuticular Monocryl and Dermabond. The port was accessed and it flushed and aspirated easily and was left flushed with concentrated heparin. Sponge needle and instrument counts were correct.    Findings: As above  Estimated Blood Loss:  Minimal         Drains: None  Blood Given: none          Specimens: #1 left breast lumpectomy   #2 additional superior margin oriented   #3 left axillary sentinel lymph nodes X 4        Complications:  * No complications entered in OR log *         Disposition: PACU - hemodynamically stable.         Condition: stable

## 2017-06-09 NOTE — Discharge Instructions (Signed)
[facility] °Office Phone Number [phone] ° °BREAST BIOPSY/ PARTIAL MASTECTOMY: POST OP INSTRUCTIONS ° °Always review your discharge instruction sheet given to you by the facility where your surgery was performed. ° °IF YOU HAVE DISABILITY OR FAMILY LEAVE FORMS, YOU MUST BRING THEM TO THE OFFICE FOR PROCESSING.  DO NOT GIVE THEM TO YOUR DOCTOR. ° °1. A prescription for pain medication may be given to you upon discharge.  Take your pain medication as prescribed, if needed.  If narcotic pain medicine is not needed, then you may take acetaminophen (Tylenol) or ibuprofen (Advil) as needed. °2. Take your usually prescribed medications unless otherwise directed °3. If you need a refill on your pain medication, please contact your pharmacy.  They will contact our office to request authorization.  Prescriptions will not be filled after 5pm or on week-ends. °4. You should eat very light the first 24 hours after surgery, such as soup, crackers, pudding, etc.  Resume your normal diet the day after surgery. °5. Most patients will experience some swelling and bruising in the breast.  Ice packs and a good support bra will help.  Swelling and bruising can take several days to resolve.  °6. It is common to experience some constipation if taking pain medication after surgery.  Increasing fluid intake and taking a stool softener will usually help or prevent this problem from occurring.  A mild laxative (Milk of Magnesia or Miralax) should be taken according to package directions if there are no bowel movements after 48 hours. °7. Unless discharge instructions indicate otherwise, you may remove your bandages 24-48 hours after surgery, and you may shower at that time.  You may have steri-strips (small skin tapes) in place directly over the incision.  These strips should be left on the skin for 7-10 days.  If your surgeon used skin glue on the incision, you may shower in 24 hours.  The glue will flake off over the  next 2-3 weeks.  Any sutures or staples will be removed at the office during your follow-up visit. °8. ACTIVITIES:  You may resume regular daily activities (gradually increasing) beginning the next day.  Wearing a good support bra or sports bra minimizes pain and swelling.  You may have sexual intercourse when it is comfortable. °a. You may drive when you no longer are taking prescription pain medication, you can comfortably wear a seatbelt, and you can safely maneuver your car and apply brakes. °b. RETURN TO WORK:  ______________________________________________________________________________________ °9. You should see your doctor in the office for a follow-up appointment approximately two weeks after your surgery.  Your doctor’s nurse will typically make your follow-up appointment when she calls you with your pathology report.  Expect your pathology report 2-3 business days after your surgery.  You may call to check if you do not hear from us after three days. °10. OTHER INSTRUCTIONS: _______________________________________________________________________________________________ _____________________________________________________________________________________________________________________________________ °_____________________________________________________________________________________________________________________________________ °_____________________________________________________________________________________________________________________________________ ° °WHEN TO CALL YOUR DOCTOR: °1. Fever over 101.0 °2. Nausea and/or vomiting. °3. Extreme swelling or bruising. °4. Continued bleeding from incision. °5. Increased pain, redness, or drainage from the incision. ° °The clinic staff is available to answer your questions during regular business hours.  Please don’t hesitate to call and ask to speak to one of the nurses for clinical concerns.  If you have a medical emergency, go to the nearest  emergency room or call 911.  A surgeon from [facility] is always on call at the hospital. ° °For further questions, please visit [email or web address]  ° ° °  Regional Anesthesia Blocks ° °1. Numbness or the inability to move the "blocked" extremity may last from 3-48 hours after placement. The length of time depends on the medication injected and your individual response to the medication. If the numbness is not going away after 48 hours, call your surgeon. ° °2. The extremity that is blocked will need to be protected until the numbness is gone and the  Strength has returned. Because you cannot feel it, you will need to take extra care to avoid injury. Because it may be weak, you may have difficulty moving it or using it. You may not know what position it is in without looking at it while the block is in effect. ° °3. For blocks in the legs and feet, returning to weight bearing and walking needs to be done carefully. You will need to wait until the numbness is entirely gone and the strength has returned. You should be able to move your leg and foot normally before you try and bear weight or walk. You will need someone to be with you when you first try to ensure you do not fall and possibly risk injury. ° °4. Bruising and tenderness at the needle site are common side effects and will resolve in a few days. ° °5. Persistent numbness or new problems with movement should be communicated to the surgeon or the [facility] ([phone])/ [facility] ([phone]). °Post Anesthesia Home Care Instructions ° °Activity: °Get plenty of rest for the remainder of the day. A responsible individual must stay with you for 24 hours following the procedure.  °For the next 24 hours, DO NOT: °-Drive a car °-Operate machinery °-Drink alcoholic beverages °-Take any medication unless instructed by your physician °-Make any legal decisions or sign important papers. ° °Meals: °Start with liquid  foods such as gelatin or soup. Progress to regular foods as tolerated. Avoid greasy, spicy, heavy foods. If nausea and/or vomiting occur, drink only clear liquids until the nausea and/or vomiting subsides. Call your physician if vomiting continues. ° °Special Instructions/Symptoms: °Your throat may feel dry or sore from the anesthesia or the breathing tube placed in your throat during surgery. If this causes discomfort, gargle with warm salt water. The discomfort should disappear within 24 hours. ° °If you had a scopolamine patch placed behind your ear for the management of post- operative nausea and/or vomiting: ° °1. The medication in the patch is effective for 72 hours, after which it should be removed.  Wrap patch in a tissue and discard in the trash. Wash hands thoroughly with soap and water. °2. You may remove the patch earlier than 72 hours if you experience unpleasant side effects which may include dry mouth, dizziness or visual disturbances. °3. Avoid touching the patch. Wash your hands with soap and water after contact with the patch. °   ° ° °

## 2017-06-09 NOTE — Anesthesia Preprocedure Evaluation (Addendum)
Anesthesia Evaluation  Patient identified by MRN, date of birth, ID band Patient awake    Reviewed: Allergy & Precautions, NPO status , Patient's Chart, lab work & pertinent test results  Airway Mallampati: II  TM Distance: >3 FB Neck ROM: Full    Dental no notable dental hx.    Pulmonary neg pulmonary ROS,    Pulmonary exam normal breath sounds clear to auscultation       Cardiovascular negative cardio ROS Normal cardiovascular exam Rhythm:Regular Rate:Normal  ECG: NSR, rate 77  ECHO: LV EF: 60% -   65%   Neuro/Psych Depression Deaf    GI/Hepatic negative GI ROS, Neg liver ROS,   Endo/Other  diabetes, Oral Hypoglycemic Agents  Renal/GU negative Renal ROS     Musculoskeletal negative musculoskeletal ROS (+)   Abdominal (+) + obese,   Peds  Hematology  (+) anemia ,   Anesthesia Other Findings Breast CA HLD  Reproductive/Obstetrics                            Anesthesia Physical Anesthesia Plan  ASA: III  Anesthesia Plan: General and Regional   Post-op Pain Management: GA combined w/ Regional for post-op pain   Induction: Intravenous  PONV Risk Score and Plan: 3 and Ondansetron, Dexamethasone and Midazolam  Airway Management Planned: LMA  Additional Equipment:   Intra-op Plan:   Post-operative Plan: Extubation in OR  Informed Consent: I have reviewed the patients History and Physical, chart, labs and discussed the procedure including the risks, benefits and alternatives for the proposed anesthesia with the patient or authorized representative who has indicated his/her understanding and acceptance.   Dental advisory given  Plan Discussed with: CRNA  Anesthesia Plan Comments:         Anesthesia Quick Evaluation

## 2017-06-09 NOTE — Progress Notes (Signed)
Assisted Dr. Ellender with left, ultrasound guided, pectoralis block. Side rails up, monitors on throughout procedure. See vital signs in flow sheet. Tolerated Procedure well. 

## 2017-06-09 NOTE — Anesthesia Postprocedure Evaluation (Signed)
Anesthesia Post Note  Patient: Erin [name]  Procedure(s) Performed: Procedure(s) (LRB): BREAST LUMPECTOMY WITH RADIOACTIVE SEED AND SENTINEL LYMPH NODE BIOPSY (Left) INSERTION PORT-A-CATH WITH [place] (Right)     Patient location during evaluation: PACU Anesthesia Type: Regional and General Level of consciousness: awake and alert Pain management: pain level controlled Vital Signs Assessment: post-procedure vital signs reviewed and stable Respiratory status: spontaneous breathing, nonlabored ventilation, respiratory function stable and patient connected to nasal cannula oxygen Cardiovascular status: blood pressure returned to baseline and stable Postop Assessment: no apparent nausea or vomiting Anesthetic complications: no    Last Vitals:  Vitals:   06/09/17 1315 06/09/17 1324  BP: 140/60 (!) 129/55  Pulse: 76 75  Resp: 18 18  Temp:  36.6 C  SpO2: 99% 97%    Last Pain:  Vitals:   06/09/17 1324  TempSrc: Oral  PainSc: 0-No pain                 Kenasia Scheller P Kayce Chismar

## 2017-06-09 NOTE — Anesthesia Procedure Notes (Signed)
Procedure Name: LMA Insertion Date/Time: 06/09/2017 10:21 AM Performed by: Toula Moos L Pre-anesthesia Checklist: Patient identified, Emergency Drugs available, Suction available, Patient being monitored and Timeout performed Patient Re-evaluated:Patient Re-evaluated prior to induction Oxygen Delivery Method: Circle system utilized Preoxygenation: Pre-oxygenation with 100% oxygen Induction Type: IV induction Ventilation: Mask ventilation without difficulty LMA: LMA inserted LMA Size: 4.0 Number of attempts: 1 Airway Equipment and Method: Bite block Placement Confirmation: positive ETCO2 Tube secured with: Tape Dental Injury: Teeth and Oropharynx as per pre-operative assessment

## 2017-06-09 NOTE — Interval H&P Note (Signed)
History and Physical Interval Note:  06/09/2017 9:16 AM  Erin [name]  has presented today for surgery, with the diagnosis of LEFT BREAST CANCER  The various methods of treatment have been discussed with the patient and family. After consideration of risks, benefits and other options for treatment, the patient has consented to  Procedure(s): BREAST LUMPECTOMY WITH RADIOACTIVE SEED AND SENTINEL LYMPH NODE BIOPSY (Left) INSERTION PORT-A-CATH WITH [place] (N/A) as a surgical intervention .  The patient's history has been reviewed, patient examined, no change in status, stable for surgery.  I have reviewed the patient's chart and labs.  Questions were answered to the patient's satisfaction.     Hilda Rynders T

## 2017-06-09 NOTE — Transfer of Care (Signed)
Immediate Anesthesia Transfer of Care Note  Patient: Erin [name]  Procedure(s) Performed: Procedure(s): BREAST LUMPECTOMY WITH RADIOACTIVE SEED AND SENTINEL LYMPH NODE BIOPSY (Left) INSERTION PORT-A-CATH WITH [place] (Right)  Patient Location: PACU  Anesthesia Type:GA combined with regional for post-op pain  Level of Consciousness: awake and patient cooperative  Airway & Oxygen Therapy: Patient Spontanous Breathing and Patient connected to face mask oxygen  Post-op Assessment: Report given to RN and Post -op Vital signs reviewed and stable  Post vital signs: Reviewed and stable  Last Vitals:  Vitals:   06/09/17 0935 06/09/17 0940  BP: (!) 142/76 (!) 147/98  Pulse: 76 89  Resp: 18 16  Temp:    SpO2: 100% 96%    Last Pain:  Vitals:   06/09/17 0852  TempSrc: Oral  PainSc: 0-No pain         Complications: No apparent anesthesia complications

## 2017-06-09 NOTE — H&P (Signed)
History of Present Illness Marland Kitchen T. Denette Hass MD; 05/19/2017 10:44 AM) The patient is a 56 year old female who presents with breast cancer. She is a postmenopausal female referred by Dr. Kristopher Oppenheim for evaluation of recently diagnosed carcinoma of the left breast. She recently presented for a screening mamogram revealing possible asymmetry in the left breast. she is deaf and all history is taken via a Forensic scientist. Subsequent imaging included diagnostic mamogram showing focal persistent asymmetry in the far posterior central slightly lateral left breast and ultrasound showing an irregular hypoechoic mass at the 3:30 position 6 cm from the nipple corresponding to the mammographic abnormality and measuring 1 cm. 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 She is seen now in breast multidisciplinary clinic for initial treatment planning. She has experienced no breast symptoms, specifically lump or pain, skin changes or nipple discharge. She does not have a personal history of any previous breast problems.  Findings at that time were the following: Tumor size: 1 cm Tumor grade: 2-3, Ki-67 20% Estrogen Receptor: 0% negative Progesterone Receptor: 0% negative Her-2 neu: positive Lymph node status: negative    Past Surgical History Tawni Pummel, RN; 05/19/2017 7:28 AM) Breast Biopsy  Left.  Diagnostic Studies History Tawni Pummel, RN; 05/19/2017 7:28 AM) Colonoscopy  never Mammogram  within last year Pap Smear  1-5 years ago  Medication History Tawni Pummel, RN; 05/19/2017 7:28 AM) Medications Reconciled  Social History Tawni Pummel, RN; 05/19/2017 7:28 AM) Alcohol use  Occasional alcohol use. Caffeine use  Coffee. No drug use  Tobacco use  Never smoker.  Family History Tawni Pummel, RN; 05/19/2017 7:28 AM) Alcohol Abuse  Brother, Family Members In General,  Father. Colon Cancer  Family Members In General. Diabetes Mellitus  Family Members In General, Father. Heart Disease  Father. Hypertension  Mother, Sister.  Pregnancy / Birth History Tawni Pummel, RN; 05/19/2017 7:28 AM) Age at menarche  52 years. Gravida  3 Maternal age  14-25 Para  2 Regular periods   Other Problems Tawni Pummel, RN; 05/19/2017 7:28 AM) Back Pain  Breast Cancer  Cerebrovascular Accident  Chest pain  Diabetes Mellitus  Gastroesophageal Reflux Disease  Hypercholesterolemia  Lump In Breast     Review of Systems Sunday Spillers Ledford RN; 05/19/2017 7:28 AM) General Present- Fatigue. Not Present- Appetite Loss, Chills, Fever, Night Sweats, Weight Gain and Weight Loss. Skin Not Present- Change in Wart/Mole, Dryness, Hives, Jaundice, New Lesions, Non-Healing Wounds, Rash and Ulcer. HEENT Present- Hearing Loss, Ringing in the Ears and Wears glasses/contact lenses. Not Present- Earache, Hoarseness, Nose Bleed, Oral Ulcers, Seasonal Allergies, Sinus Pain, Sore Throat, Visual Disturbances and Yellow Eyes. Respiratory Present- Snoring. Not Present- Bloody sputum, Chronic Cough, Difficulty Breathing and Wheezing. Breast Not Present- Breast Mass, Breast Pain, Nipple Discharge and Skin Changes. Cardiovascular Present- Difficulty Breathing Lying Down and Shortness of Breath. Not Present- Chest Pain, Leg Cramps, Palpitations, Rapid Heart Rate and Swelling of Extremities. Gastrointestinal Present- Bloating, Chronic diarrhea, Constipation, Difficulty Swallowing, Excessive gas and Indigestion. Not Present- Abdominal Pain, Bloody Stool, Change in Bowel Habits, Gets full quickly at meals, Hemorrhoids, Nausea, Rectal Pain and Vomiting. Female Genitourinary Present- Urgency. Not Present- Frequency, Nocturia, Painful Urination and Pelvic Pain. Musculoskeletal Present- Back Pain and Joint Stiffness. Not Present- Joint Pain, Muscle Pain, Muscle Weakness and Swelling of  Extremities. Neurological Present- Tremor, Trouble walking and Weakness. Not Present- Decreased Memory, Fainting, Headaches, Numbness, Seizures and Tingling. Psychiatric Not Present-  Anxiety, Bipolar, Change in Sleep Pattern, Depression, Fearful and Frequent crying. Hematology Not Present- Blood Thinners, Easy Bruising, Excessive bleeding, Gland problems, HIV and Persistent Infections.   Physical Exam Marland Kitchen T. Rosser Collington MD; 05/19/2017 10:43 AM) The physical exam findings are as follows: Note:General: Alert, mildly obese African-American female, in no distress. She is deaf. Skin: Warm and dry without rash or infection. HEENT: No palpable masses or thyromegaly. Sclera nonicteric. Pupils equal round and reactive. Lymph nodes: No cervical, supraclavicular, nodes palpable. Breasts: Some bruising and thickening lower outer left breast at the biopsy site. No otherepalpersion or crusting. No skin changes or nipple inversion or crusting Lungs: Breath sounds clear and equal. No wheezing or increased work of breathing. Cardiovascular: Regular rate and rhythm without murmer. No JVD or edema. Abdomen: Nondistended. Soft and nontender. No masses palpable. No organomegaly. No palpable hernias. Extremities: No edema or joint swelling or deformity. No chronic venous stasis changes. Neurologic: Alert and fully oriented. Gait normal. No focal weakness. positive for deafness Psychiatric: Normal mood and affect. Thought content appropriate with normal judgement and insight    Assessment & Plan Marland Kitchen T. Saryna Kneeland MD; 05/19/2017 10:48 AM) MALIGNANT NEOPLASM OF LEFT BREAST, STAGE 1, ESTROGEN RECEPTOR NEGATIVE (C50.912) Impression: 55 year old female with a new diagnosis of cancer of the left breast, lower outer quadrant. Clinical stage 1 a, ER negative, PR negative, HER-2 positive. I discussed with the patient and her mother present today initial surgical treatment options. We discussed options of breast  conservation with lumpectomy or total mastectomy and sentinal lymph node biopsy/dissection. I believe she would be an excellent candidate for breast conservation.Options for reconstruction were discussed. After discussion they have elected to proceed with left breast lumpectomy and sentinel lymph node biopsy. she will require chemotherapy and Herceptin and will need a port placed at the time of her breast surgery.We discussed the indications and nature of the procedures, and expected recovery, in detail. Surgical risks including anesthetic complications, cardiorespiratory complications, bleeding, infection, wound healing complications, blood clots, lymphedema, local and distant recurrence and possible need for further surgery based on the final pathology was discussed and understood. specific risks including DVT and pneumothorax were discussed.Chemotherapy, hormonal therapy and radiation therapy have been discussed. They have been provided with literature regarding the treatment of breast cancer. All questions were answered. They understand and agree to proceed and we will go ahead with scheduling. Current Plans radioactive seed localized left breast lumpectomy and left axillary sentinel lymph node biopsy and placement of Port-A-Cath under general anesthesia as an outpatient

## 2017-06-09 NOTE — Progress Notes (Signed)
Nuc med staff performed nuc med inj. No additional sedation required. Pt tol well with assistance of sign languate interpreter throughout injection and previous block (at bedside). Will call family to bedside and update/provide emotional support.

## 2017-06-09 NOTE — Anesthesia Procedure Notes (Signed)
Anesthesia Regional Block: Pectoralis block   Pre-Anesthetic Checklist: ,, timeout performed, Correct Patient, Correct Site, Correct Laterality, Correct Procedure,, site marked, risks and benefits discussed, Surgical consent,  Pre-op evaluation,  At surgeon's request and post-op pain management  Laterality: Left  Prep: chloraprep       Needles:  Injection technique: Single-shot  Needle Type: Echogenic Stimulator Needle     Needle Length: 9cm  Needle Gauge: 21     Additional Needles:   Procedures:,,,, ultrasound used (permanent image in chart),,,,  Narrative:  Start time: 06/09/2017 9:35 AM End time: 06/09/2017 9:45 AM Injection made incrementally with aspirations every 5 mL.  Performed by: Personally  Anesthesiologist: Adele Barthel P  Additional Notes: Functioning IV was confirmed and monitors were applied.  A 48mm 21ga Arrow echogenic stimulator needle was used. Sterile prep, hand hygiene and sterile gloves were used.  Negative aspiration and negative test dose prior to incremental administration of local anesthetic. The patient tolerated the procedure well.

## 2017-06-10 ENCOUNTER — Encounter (HOSPITAL_BASED_OUTPATIENT_CLINIC_OR_DEPARTMENT_OTHER): Payer: Self-pay | Admitting: General Surgery

## 2017-06-16 ENCOUNTER — Telehealth: Payer: Self-pay | Admitting: Hematology and Oncology

## 2017-06-16 ENCOUNTER — Ambulatory Visit (HOSPITAL_BASED_OUTPATIENT_CLINIC_OR_DEPARTMENT_OTHER): Payer: Medicare Other | Admitting: Hematology and Oncology

## 2017-06-16 VITALS — BP 154/66 | HR 83 | Temp 97.9°F | Resp 18 | Ht 61.0 in | Wt 164.6 lb

## 2017-06-16 DIAGNOSIS — C50512 Malignant neoplasm of lower-outer quadrant of left female breast: Secondary | ICD-10-CM

## 2017-06-16 DIAGNOSIS — Z171 Estrogen receptor negative status [ER-]: Secondary | ICD-10-CM | POA: Diagnosis not present

## 2017-06-16 MED FILL — METFORMIN HCL ER 500 MG TAB: 500 | 30 days supply | Qty: 60 | Fill #2

## 2017-06-16 MED FILL — ?ATORVASTATIN 40MG TABLET: 40 | 30 days supply | Qty: 30 | Fill #2

## 2017-06-16 NOTE — Telephone Encounter (Signed)
Gave patient avs and calendar per 10/3 los °

## 2017-06-17 MED ORDER — ONDANSETRON HCL 8 MG PO TABS: 8.0000 mg | ORAL_TABLET | Freq: Two times a day (BID) | ORAL | 1 refills | Status: DC | PRN

## 2017-06-17 MED ORDER — LIDOCAINE-PRILOCAINE 2.5-2.5 % EX CREA: TOPICAL_CREAM | CUTANEOUS | 3 refills | Status: DC

## 2017-06-17 MED ORDER — PROCHLORPERAZINE MALEATE 10 MG PO TABS: 10.0000 mg | ORAL_TABLET | Freq: Four times a day (QID) | ORAL | 1 refills | Status: DC | PRN

## 2017-06-17 MED FILL — ONDANSETRON HCL 8 MG TABLET: 8 | 15 days supply | Qty: 30 | Fill #0 | Status: TO

## 2017-06-17 MED FILL — PROCHLORPERAZINE 10 MG TAB: 10 | 7 days supply | Qty: 30 | Fill #0 | Status: TO

## 2017-06-17 MED FILL — LIDOCAINE-PRILOCAINE CREAM: 2.5-2.5 | 30 days supply | Qty: 30 | Fill #0 | Status: TO

## 2017-06-17 NOTE — Progress Notes (Signed)
Patient Care Team: Patient, No Pcp Per as PCP - General (General Practice) Excell Seltzer, MD as Consulting Physician (General Surgery) [name], MD as Consulting Physician (Hematology and Oncology) Kyung Rudd, MD as Consulting Physician (Radiation Oncology)  DIAGNOSIS:  Encounter Diagnosis  Name Primary?  . Malignant neoplasm of lower-outer quadrant of left breast of female, estrogen receptor negative ([place])     SUMMARY OF ONCOLOGIC HISTORY:   Malignant neoplasm of lower-outer quadrant of left breast of female, estrogen receptor negative ([place])   05/11/2017 Initial Diagnosis    Left breast biopsy 3:30 position 6 cm from nipple: IDC with DCIS, lymphovascular invasion present, grade 2-3, ER 0%, PR 0%, HER-2 positive ratio 2.28, Ki-67 20%, 1 cm lesion left breast, T1b N0 stage IA clinical stage      06/09/2017 Surgery    Left lumpectomy: IDC grade 3, 1.2 cm, DCIS is present, margins negative, 0/6 lymph nodes negative, ER 0%, PR 0%, HER-2 positive ratio 2.28, Ki-67 20%, T1 BN 0 stage IA       CHIEF COMPLIANT: Follow-up after undergoing breast conserving surgery  INTERVAL HISTORY: Erin [name] is a 56 year old with above-mentioned history of left breast cancer who underwent lumpectomy and is here today to discuss the results. She has done extremely well from the surgery. She denies any pain or discomfort. She does not take any pain medications anymore. I interviewed her with the help of an interpreter for sign language.  REVIEW OF SYSTEMS:   Constitutional: Denies fevers, chills or abnormal weight loss Eyes: Denies blurriness of vision Ears, nose, mouth, throat, and face: Denies mucositis or sore throat Respiratory: Denies cough, dyspnea or wheezes Cardiovascular: Denies palpitation, chest discomfort Gastrointestinal:  Denies nausea, heartburn or change in bowel habits Skin: Denies abnormal skin rashes Lymphatics: Denies new lymphadenopathy or easy  bruising Neurological:Denies numbness, tingling or new weaknesses Behavioral/Psych: Mood is stable, no new changes  Extremities: No lower extremity edema Breast:  denies any pain or lumps or nodules in either breasts, recent lumpectomy All other systems were reviewed with the patient and are negative.  I have reviewed the past medical history, past surgical history, social history and family history with the patient and they are unchanged from previous note.  ALLERGIES:  has No Known Allergies.  MEDICATIONS:  Current Outpatient Prescriptions  Medication Sig Dispense Refill  . atorvastatin (LIPITOR) 40 MG tablet Take 1 tablet (40 mg total) by mouth daily. 90 tablet 3  . Blood Glucose Monitoring Suppl (ACCU-CHEK AVIVA PLUS) W/DEVICE KIT Check Blood sugar AM fasting and before dinner 1 kit 0  . cholecalciferol (VITAMIN D) 1000 units tablet Take 1,000 Units by mouth daily.    Water engineer Bandages & Supports (LUMBAR BACK BRACE/SUPPORT PAD) MISC 1 each by Does not apply route daily. 1 each 0  . ferrous sulfate 325 (65 FE) MG tablet Take 1 tablet (325 mg total) by mouth daily with breakfast. 30 tablet 3  . glucose blood (ACCU-CHEK AVIVA) test strip Use as instructed 100 each 12  . Lancet Devices (ACCU-CHEK SOFTCLIX) lancets Use as instructed 1 each 0  . metFORMIN (GLUCOPHAGE XR) 500 MG 24 hr tablet Take 2 tablets (1,000 mg total) by mouth daily with breakfast. 60 tablet 11  . Omega-3 Fatty Acids (FISH OIL OMEGA-3 PO) Take by mouth.    . oxyCODONE (OXY IR/ROXICODONE) 5 MG immediate release tablet Take 1 tablet (5 mg total) by mouth every 6 (six) hours as needed for severe pain. 10 tablet 0  .  TRUEPLUS LANCETS 28G MISC USE AS DIRECTED BY PHYSICIAN 100 each 0   No current facility-administered medications for this visit.     PHYSICAL EXAMINATION: ECOG PERFORMANCE STATUS: 1 - Symptomatic but completely ambulatory  Vitals:   06/16/17 1125  BP: (!) 154/66  Pulse: 83  Resp: 18  Temp: 97.9 F  (36.6 C)  SpO2: 100%   Filed Weights   06/16/17 1125  Weight: 164 lb 9.6 oz (74.7 kg)    GENERAL:alert, no distress and comfortable SKIN: skin color, texture, turgor are normal, no rashes or significant lesions EYES: normal, Conjunctiva are pink and non-injected, sclera clear OROPHARYNX:no exudate, no erythema and lips, buccal mucosa, and tongue normal  NECK: supple, thyroid normal size, non-tender, without nodularity LYMPH:  no palpable lymphadenopathy in the cervical, axillary or inguinal LUNGS: clear to auscultation and percussion with normal breathing effort HEART: regular rate & rhythm and no murmurs and no lower extremity edema ABDOMEN:abdomen soft, non-tender and normal bowel sounds MUSCULOSKELETAL:no cyanosis of digits and no clubbing  NEURO: alert & oriented x 3 with fluent speech, no focal motor/sensory deficits EXTREMITIES: No lower extremity edema   LABORATORY DATA:  I have reviewed the data as listed   Chemistry      Component Value Date/Time   NA 139 05/19/2017 0806   K 3.8 05/19/2017 0806   CL 103 04/02/2017 1208   CO2 26 05/19/2017 0806   BUN 9.0 05/19/2017 0806   CREATININE 0.7 05/19/2017 0806      Component Value Date/Time   CALCIUM 9.7 05/19/2017 0806   ALKPHOS 78 05/19/2017 0806   AST 16 05/19/2017 0806   ALT 17 05/19/2017 0806   BILITOT <0.22 05/19/2017 0806       Lab Results  Component Value Date   WBC 10.6 05/26/2017   HGB 10.1 (L) 05/26/2017   HCT 32.5 (L) 05/26/2017   MCV 86 05/26/2017   PLT 375 05/26/2017   NEUTROABS 6.0 05/26/2017    ASSESSMENT & PLAN:  Malignant neoplasm of lower-outer quadrant of left breast of female, estrogen receptor negative ([place]) 06/09/2017: Left lumpectomy: IDC grade 3, 1.2 cm, DCIS is present, margins negative, 0/6 lymph nodes negative, ER 0%, PR 0%, HER-2 positive ratio 2.28, Ki-67 20%, T1 BN 0 stage IA  Pathology counseling: I discussed the final pathology report of the patient provided  a copy of this  report. I discussed the margins as well as lymph node surgeries. We also discussed the final staging along with previously performed ER/PR and HER-2/neu testing.  Recommendation: 1. Adjuvant chemotherapy with Taxol Herceptin weekly 12 followed by Herceptin every 3 weeks for total of 6 months 2. Followed by adjuvant radiation  Return to clinic in 3 weeks to start chemotherapy  I spent 25 minutes talking to the patient of which more than half was spent in counseling and coordination of care.  No orders of the defined types were placed in this encounter.  The patient has a good understanding of the overall plan. she agrees with it. she will call with any problems that may develop before the next visit here.   Rulon Eisenmenger, MD 06/17/17

## 2017-06-17 NOTE — Assessment & Plan Note (Signed)
06/09/2017: Left lumpectomy: IDC grade 3, 1.2 cm, DCIS is present, margins negative, 0/6 lymph nodes negative, ER 0%, PR 0%, HER-2 positive ratio 2.28, Ki-67 20%, T1 BN 0 stage IA  Pathology counseling: I discussed the final pathology report of the patient provided  a copy of this report. I discussed the margins as well as lymph node surgeries. We also discussed the final staging along with previously performed ER/PR and HER-2/neu testing.  Recommendation: 1. Adjuvant chemotherapy with Taxol Herceptin weekly 12 followed by Herceptin every 3 weeks for total of 6 months 2. Followed by adjuvant radiation  Return to clinic in 3 weeks to start chemotherapy

## 2017-06-17 NOTE — Progress Notes (Signed)
START ON PATHWAY REGIMEN - Breast   Paclitaxel Weekly + Trastuzumab Weekly:   Administer weekly:     Paclitaxel      Trastuzumab      Trastuzumab   **Always confirm dose/schedule in your pharmacy ordering system**    Trastuzumab (Maintenance - NO Loading Dose):   A cycle is every 21 days:     Trastuzumab   **Always confirm dose/schedule in your pharmacy ordering system**    Patient Characteristics: Postoperative without Neoadjuvant Therapy (Pathologic Staging), Invasive Disease, Adjuvant Therapy, HER2 Positive, ER Negative/Unknown, Node Negative, pT1b, pN0/N18m, Chemotherapy Indicated Therapeutic Status: Postoperative without Neoadjuvant Therapy (Pathologic Staging) AJCC Grade: G3 AJCC N Category: pN0 AJCC M Category: cM0 ER Status: Negative (-) AJCC 8 Stage Grouping: IA HER2 Status: Positive (+) Oncotype Dx Recurrence Score: Not Appropriate AJCC T Category: pT1b PR Status: Negative (-) Intent of Therapy: Curative Intent, Discussed with Patient

## 2017-06-22 ENCOUNTER — Ambulatory Visit: Payer: Medicaid Other | Attending: Family Medicine | Admitting: Family Medicine

## 2017-06-22 ENCOUNTER — Encounter: Payer: Self-pay | Admitting: Family Medicine

## 2017-06-22 VITALS — BP 111/71 | HR 83 | Temp 97.5°F | Resp 18 | Ht 61.0 in | Wt 163.2 lb

## 2017-06-22 DIAGNOSIS — Z7984 Long term (current) use of oral hypoglycemic drugs: Secondary | ICD-10-CM | POA: Diagnosis not present

## 2017-06-22 DIAGNOSIS — B351 Tinea unguium: Secondary | ICD-10-CM | POA: Diagnosis not present

## 2017-06-22 DIAGNOSIS — R809 Proteinuria, unspecified: Secondary | ICD-10-CM

## 2017-06-22 DIAGNOSIS — N921 Excessive and frequent menstruation with irregular cycle: Secondary | ICD-10-CM

## 2017-06-22 DIAGNOSIS — E118 Type 2 diabetes mellitus with unspecified complications: Secondary | ICD-10-CM

## 2017-06-22 DIAGNOSIS — H9190 Unspecified hearing loss, unspecified ear: Secondary | ICD-10-CM | POA: Diagnosis not present

## 2017-06-22 DIAGNOSIS — Z79899 Other long term (current) drug therapy: Secondary | ICD-10-CM | POA: Diagnosis not present

## 2017-06-22 DIAGNOSIS — C50919 Malignant neoplasm of unspecified site of unspecified female breast: Secondary | ICD-10-CM | POA: Diagnosis not present

## 2017-06-22 DIAGNOSIS — E119 Type 2 diabetes mellitus without complications: Secondary | ICD-10-CM | POA: Diagnosis present

## 2017-06-22 LAB — POCT UA - MICROALBUMIN
Creatinine, POC: 50 mg/dL
Microalbumin Ur, POC: 30 mg/L

## 2017-06-22 LAB — POCT GLYCOSYLATED HEMOGLOBIN (HGB A1C): HEMOGLOBIN A1C: 5.8

## 2017-06-22 LAB — GLUCOSE, POCT (MANUAL RESULT ENTRY): POC Glucose: 116 mg/dl — AB (ref 70–99)

## 2017-06-22 MED ORDER — LISINOPRIL 2.5 MG PO TABS: 2.5000 mg | ORAL_TABLET | Freq: Every day | ORAL | 2 refills | Status: DC

## 2017-06-22 MED ORDER — CICLOPIROX 8 % EX SOLN: Freq: Every day | CUTANEOUS | 1 refills | Status: DC

## 2017-06-22 MED FILL — CICLOPIROX 8% SOLUTION: 8 | 30 days supply | Qty: 7 | Fill #0 | Status: TO

## 2017-06-22 MED FILL — LISINOPRIL 2.5 MG TABLET: 2.5 | 30 days supply | Qty: 30 | Fill #0

## 2017-06-22 NOTE — Progress Notes (Deleted)
Oct 3 next appointment [facility]  October 17th  September  Family history  GM-father (cancer)  Current cycle  9/26 light, heavy , light agin  2 week prior surgeny  then restarted  Missed period for 2 months  Less than 7 days  12 days Pads 3 to 4 times   Irregular menstrual periods   No pain nor cramping  Regular unknown Referal opthal.

## 2017-06-22 NOTE — Progress Notes (Signed)
Patient is here for f/up   Patient stated that her period been on since 06/09/2017 and it varies from heavy to light

## 2017-06-22 NOTE — Patient Instructions (Signed)

## 2017-06-23 NOTE — Progress Notes (Signed)
Subjective:  Patient ID: Erin [name], female    DOB: 1961/06/13  Age: 55 y.o. MRN: [id number]  CC: Diabetes   HPI Erin [name] presents for history of diabetes. Patient has a history of deafness ASL interpreter present in room.  Symptoms: none. Patient denies foot ulcerations, nausea, paresthesia of the feet, visual disturbances and vomitting.  Evaluation to date has been included: fasting blood sugar and fasting lipid panel.  Home sugars: patient does not check sugars. Treatment to date: Metformin. Past medical history includes breast cancer. She reports appointment surgery later this month with upcoming chemotherapy. She reports history of heavy menstrual periods irregular cycles. Ongoing complaint. She reports last menstrual period began 06/09/2017 menstrual cycle started off with light flow, then to heavy, then to light again. She reports history of irregular menstrual cycle before where she missed her period for 2 months. She reports typical menstrual cycles last less than 7 days. She currently reports going through 3-4 pads per day. She denies any pelvic pain or cramping.   Outpatient Medications Prior to Visit  Medication Sig Dispense Refill  . atorvastatin (LIPITOR) 40 MG tablet Take 1 tablet (40 mg total) by mouth daily. 90 tablet 3  . Blood Glucose Monitoring Suppl (ACCU-CHEK AVIVA PLUS) W/DEVICE KIT Check Blood sugar AM fasting and before dinner 1 kit 0  . cholecalciferol (VITAMIN D) 1000 units tablet Take 1,000 Units by mouth daily.    Water engineer Bandages & Supports (LUMBAR BACK BRACE/SUPPORT PAD) MISC 1 each by Does not apply route daily. 1 each 0  . ferrous sulfate 325 (65 FE) MG tablet Take 1 tablet (325 mg total) by mouth daily with breakfast. 30 tablet 3  . glucose blood (ACCU-CHEK AVIVA) test strip Use as instructed 100 each 12  . Lancet Devices (ACCU-CHEK SOFTCLIX) lancets Use as instructed 1 each 0  . lidocaine-prilocaine (EMLA) cream Apply to affected area once 30 g 3    . metFORMIN (GLUCOPHAGE XR) 500 MG 24 hr tablet Take 2 tablets (1,000 mg total) by mouth daily with breakfast. 60 tablet 11  . Omega-3 Fatty Acids (FISH OIL OMEGA-3 PO) Take by mouth.    . ondansetron (ZOFRAN) 8 MG tablet Take 1 tablet (8 mg total) by mouth 2 (two) times daily as needed (Nausea or vomiting). 30 tablet 1  . oxyCODONE (OXY IR/ROXICODONE) 5 MG immediate release tablet Take 1 tablet (5 mg total) by mouth every 6 (six) hours as needed for severe pain. 10 tablet 0  . prochlorperazine (COMPAZINE) 10 MG tablet Take 1 tablet (10 mg total) by mouth every 6 (six) hours as needed (Nausea or vomiting). 30 tablet 1  . TRUEPLUS LANCETS 28G MISC USE AS DIRECTED BY PHYSICIAN 100 each 0   No facility-administered medications prior to visit.     ROS Review of Systems  Eyes: Negative.   Respiratory: Negative.   Cardiovascular: Negative.   Gastrointestinal: Negative.   Genitourinary: Positive for menstrual problem.  Skin: Negative.      Objective:  BP 111/71 (BP Location: Left Arm, Patient Position: Sitting, Cuff Size: Normal)   Pulse 83   Temp (!) 97.5 F (36.4 C) (Oral)   Resp 18   Ht 5' 1"  (1.549 m)   Wt 163 lb 3.2 oz (74 kg)   LMP 06/09/2017   SpO2 100%   BMI 30.84 kg/m   BP/Weight 06/22/2017 06/16/2017 7/86/7672  Systolic BP 094 709 628  Diastolic BP 71 66 55  Wt. (Lbs) 163.2 164.6 164  BMI 30.84 31.1 30.99     Physical Exam  Eyes: Pupils are equal, round, and reactive to light. Conjunctivae are normal.  Cardiovascular: Normal rate, regular rhythm, normal heart sounds and intact distal pulses.   Pulmonary/Chest: Effort normal and breath sounds normal.  Abdominal: Soft. Bowel sounds are normal. There is no tenderness.  Skin: Skin is warm and dry.  Thickened, yellow discolored, brittle toenails  Nursing note and vitals reviewed.    Assessment & Plan:   1. Type 2 diabetes mellitus with complication, without long-term current use of insulin (HCC)  - Glucose  (CBG) - HgB A1c - POCT UA - Microalbumin - Ambulatory referral to Ophthalmology  2. Menorrhagia with irregular cycle Lab work obtained previously was unremarkable. Will obtain imaging to rule out any additional causes.  -US Transvaginal Non-OB; Future - US Pelvis Complete; Future  3. Onychomycosis  - ciclopirox (PENLAC) 8 % solution; Apply topically at bedtime. Apply over nail and surrounding skin. Apply daily over previous coat. After seven (7) days, may remove with alcohol and continue cycle.  Dispense: 6.6 mL; Refill: 1  4. Urine test positive for microalbuminuria  - lisinopril (PRINIVIL,ZESTRIL) 2.5 MG tablet; Take 1 tablet (2.5 mg total) by mouth daily.  Dispense: 30 tablet; Refill: 2   Meds ordered this encounter  Medications  . ciclopirox (PENLAC) 8 % solution    Sig: Apply topically at bedtime. Apply over nail and surrounding skin. Apply daily over previous coat. After seven (7) days, may remove with alcohol and continue cycle.    Dispense:  6.6 mL    Refill:  1    Order Specific Question:   Supervising Provider    Answer:   Tresa Garter [id number]  . lisinopril (PRINIVIL,ZESTRIL) 2.5 MG tablet    Sig: Take 1 tablet (2.5 mg total) by mouth daily.    Dispense:  30 tablet    Refill:  2    Order Specific Question:   Supervising Provider    Answer:   Tresa Garter [id number]    Follow-up: Return in about 3 months (around 09/22/2017) for DM/HLD.   [name] FNP

## 2017-06-28 ENCOUNTER — Telehealth: Payer: Self-pay

## 2017-06-28 NOTE — Telephone Encounter (Signed)
Pt contacted the office with a sign language interpreter. Pt states she received a letter from [facility]. Pt states she is wanting to go somewhere in [place] if that is acceptable. Please f/u

## 2017-06-29 ENCOUNTER — Ambulatory Visit (HOSPITAL_COMMUNITY)
Admission: RE | Admit: 2017-06-29 | Discharge: 2017-06-29 | Disposition: A | Discharge status: Home / Self Care | Payer: Medicaid Other | Source: Ambulatory Visit | Attending: Family Medicine | Admitting: Family Medicine

## 2017-06-29 DIAGNOSIS — N921 Excessive and frequent menstruation with irregular cycle: Secondary | ICD-10-CM | POA: Insufficient documentation

## 2017-06-30 ENCOUNTER — Encounter: Payer: Self-pay | Admitting: *Deleted

## 2017-06-30 ENCOUNTER — Other Ambulatory Visit: Payer: Medicare Other

## 2017-07-05 ENCOUNTER — Telehealth: Payer: Self-pay | Admitting: Family Medicine

## 2017-07-06 ENCOUNTER — Telehealth: Payer: Self-pay | Admitting: *Deleted

## 2017-07-06 NOTE — Telephone Encounter (Signed)
Patient call was lost

## 2017-07-06 NOTE — Telephone Encounter (Signed)
FYI Requesting "approval for a one time treatment plan schedule change to be one day early".  Video Relay Interpreter call received from patient "requesting to receive treatment 08-26-2017 instead of 08-27-2017" ended after Ilaisaane realized "the 08-27-2017 through 09-01-2017 travel has not been confirmed.  "I'll call back when I receive confirmation.  Hopefully Dr. [name] will allow this change."  Encouraged to confirm dates to notify Dr. [name] with next scheduled A.P.P. F/U.  Denies any further questions at this time.

## 2017-07-07 ENCOUNTER — Telehealth: Payer: Self-pay

## 2017-07-07 NOTE — Telephone Encounter (Signed)
Will notify Dr.Gudena and contact pt.

## 2017-07-07 NOTE — Telephone Encounter (Signed)
Called pt using interpreter to make sure she was okay to fly with her port a cath. Told pt that it shouldn't go off, but sometimes it does. We can provide pt a script and xray report that states she has a port to avoid any unnecessary body search through metal detector machine. Pt verbalized understanding and will have it available for pt when she comes for her appt on Friday. Notified and told Dr.Gudena and is aware.

## 2017-07-08 NOTE — Assessment & Plan Note (Addendum)
06/09/2017: Left lumpectomy: IDC grade 3, 1.2 cm, DCIS is present, margins negative, 0/6 lymph nodes negative, ER 0%, PR 0%, HER-2 positive ratio 2.28, Ki-67 20%, T1 BN 0 stage IA  Pathology counseling: I discussed the final pathology report of the patient provided  a copy of this report. I discussed the margins as well as lymph node surgeries. We also discussed the final staging along with previously performed ER/PR and HER-2/neu testing.  Recommendation: 1. Adjuvant chemotherapy with Taxol Herceptin weekly 12 followed by Herceptin every 3 weeks for total of 6 months 2. Followed by adjuvant radiation  Echo on 05/24/17 demonstrates LVEF of 60-65%  ____________________________________________________________________  Erin [name] is doing well today.  Her labs are stable and I reviewed these with her.  She is out of her iron tablets and that is likely why her hemoglobin is low.  She will pick some up.  If her hemoglobin remains low, we can recheck her iron and possibly give feraheme.  I reviewed this with her and her mom and she is agreeable.  I reviewed her treatments with her today again in detail, along with her medications and she verbalized understanding on everything.  She will proceed with Taxol/Herceptin today.

## 2017-07-08 NOTE — Progress Notes (Signed)
[facility] Follow up:    Erin Spruce, FNP [place] [address]   DIAGNOSIS: Cancer Staging Malignant neoplasm of lower-outer quadrant of left breast of female, estrogen receptor negative ([place]) Staging form: Breast, AJCC 8th Edition - Clinical stage from 05/18/2017: Stage IA (cT1b, cN0, cM0, G3, ER: Negative, PR: Negative, HER2: Positive) - Unsigned - Pathologic: Stage IA (pT1c, pN0, cM0, G3, ER: Negative, PR: Negative, HER2: Positive) - Signed by [name], NP on 07/07/2017   SUMMARY OF ONCOLOGIC HISTORY: Oncology History   t      Malignant neoplasm of lower-outer quadrant of left breast of female, estrogen receptor negative ([place])   05/11/2017 Initial Diagnosis    Left breast biopsy 3:30 position 6 cm from nipple: IDC with DCIS, lymphovascular invasion present, grade 2-3, ER 0%, PR 0%, HER-2 positive ratio 2.28, Ki-67 20%, 1 cm lesion left breast, T1b N0 stage IA clinical stage      06/09/2017 Surgery    Left lumpectomy: IDC grade 3, 1.2 cm, DCIS is present, margins negative, 0/6 lymph nodes negative, ER 0%, PR 0%, HER-2 positive ratio 2.28, Ki-67 20%, T1 BN 0 stage IA      07/09/2017 -  Adjuvant Chemotherapy    Taxol/Herceptin       CURRENT THERAPY: Taxol/Herceptin  INTERVAL HISTORY: Erin [name] 56 y.o. female returns for evaluation prior to starting Taxol/Herceptin today.  She is doing well today.  She is deaf and has a sign language interpreter with her today.  She has some questions about her medications.  She otherwise is well and without concerns.  She has h/o iron deficiency anemia, and denies any blood in her stool or heavy periods.     Patient Active Problem List   Diagnosis Date Noted  . Malignant neoplasm of lower-outer quadrant of left breast of female, estrogen receptor negative ([place]) 05/13/2017  . Abdominal bloating 04/02/2017  . Chronic right-sided low back pain without sciatica 07/02/2016  .  DM type 2 (diabetes mellitus, type 2) ([place]) 11/18/2014  . Intermittent constipation 06/06/2010  . UNSPECIFIED IRON DEFICIENCY ANEMIA 04/22/2010  . DEPRESSION 04/10/2010  . DEAFNESS 04/10/2010    has No Known Allergies.  MEDICAL HISTORY: Past Medical History:  Diagnosis Date  . Breast cancer ([place]) 05/2017   left  . Cough 06/03/2017  . Deaf   . Dyspnea    states SOB with ADLs and at night - no home O2  . High cholesterol   . Non-insulin dependent type 2 diabetes mellitus ([place])   . Runny nose 06/03/2017   clear drainage, per pt.    SURGICAL HISTORY: Past Surgical History:  Procedure Laterality Date  . BREAST LUMPECTOMY WITH RADIOACTIVE SEED AND SENTINEL LYMPH NODE BIOPSY Left 06/09/2017   Procedure: BREAST LUMPECTOMY WITH RADIOACTIVE SEED AND SENTINEL LYMPH NODE BIOPSY;  Surgeon: Excell Seltzer, MD;  Location: [facility];  Service: General;  Laterality: Left;  . PORTACATH PLACEMENT Right 06/09/2017   Procedure: INSERTION PORT-A-CATH WITH [place];  Surgeon: Excell Seltzer, MD;  Location: [facility];  Service: General;  Laterality: Right;  . TUBAL LIGATION  02/04/2002    SOCIAL HISTORY: Social History   Social History  . Marital status: Legally Separated    Spouse name: N/A  . Number of children: N/A  . Years of education: N/A   Occupational History  . Not on file.   Social History Main Topics  . Smoking status: Never Smoker  .  Smokeless tobacco: Never Used  . Alcohol use No  . Drug use: No  . Sexual activity: No   Other Topics Concern  . Not on file   Social History Narrative  . No narrative on file    FAMILY HISTORY: Family History  Problem Relation Age of Onset  . Hypertension Mother   . Diabetes Father   . Hypertension Father   . Hypertension Sister   . Cancer Paternal Grandmother   . Diabetes Paternal Grandmother     Review of Systems  Constitutional: Negative for appetite change, chills, fatigue and unexpected  weight change.  HENT:   Negative for hearing loss and lump/mass.   Eyes: Negative for eye problems.  Respiratory: Negative for chest tightness, cough and shortness of breath.   Cardiovascular: Negative for chest pain, leg swelling and palpitations.  Gastrointestinal: Negative for abdominal distention, abdominal pain, blood in stool, constipation, diarrhea, nausea and vomiting.  Endocrine: Negative for hot flashes.  Genitourinary: Negative for menstrual problem and vaginal bleeding.   Neurological: Negative for dizziness, extremity weakness and numbness.  Hematological: Negative for adenopathy. Does not bruise/bleed easily.  Psychiatric/Behavioral: Negative for depression. The patient is not nervous/anxious.       PHYSICAL EXAMINATION  ECOG PERFORMANCE STATUS: 0 - Asymptomatic  Vitals:   07/09/17 0928  BP: (!) 152/68  Pulse: 96  Resp: 18  Temp: 97.9 F (36.6 C)  SpO2: 100%    Physical Exam  Constitutional: She is oriented to person, place, and time and well-developed, well-nourished, and in no distress.  HENT:  Head: Normocephalic and atraumatic.  Mouth/Throat: Oropharynx is clear and moist.  Eyes: Pupils are equal, round, and reactive to light. No scleral icterus.  Neck: Neck supple.  Cardiovascular: Normal rate, regular rhythm and normal heart sounds.   Pulmonary/Chest: Effort normal and breath sounds normal.  Abdominal: Soft. Bowel sounds are normal.  Musculoskeletal: She exhibits no edema.  Lymphadenopathy:    She has no cervical adenopathy.  Neurological: She is alert and oriented to person, place, and time.  Skin: Skin is warm and dry. No rash noted.  Psychiatric: Mood and affect normal.    LABORATORY DATA:  CBC    Component Value Date/Time   WBC 9.8 07/09/2017 0832   WBC 10.3 10/12/2014 1257   RBC 3.16 (L) 07/09/2017 0832   RBC 4.07 10/12/2014 1257   HGB 8.3 (L) 07/09/2017 0832   HCT 27.1 (L) 07/09/2017 0832   PLT 273 07/09/2017 0832   PLT 375  05/26/2017 0918   MCV 85.8 07/09/2017 0832   MCH 26.3 07/09/2017 0832   MCH 26.5 10/12/2014 1257   MCHC 30.6 (L) 07/09/2017 0832   MCHC 31.9 10/12/2014 1257   RDW 15.8 (H) 07/09/2017 0832   LYMPHSABS 2.9 07/09/2017 0832   MONOABS 0.6 07/09/2017 0832   EOSABS 0.1 07/09/2017 0832   EOSABS 0.1 05/26/2017 0918   BASOSABS 0.0 07/09/2017 0832    CMP     Component Value Date/Time   NA 139 07/09/2017 0832   K 3.6 07/09/2017 0832   CL 103 04/02/2017 1208   CO2 24 07/09/2017 0832   GLUCOSE 158 (H) 07/09/2017 0832   BUN 7.6 07/09/2017 0832   CREATININE 0.7 07/09/2017 0832   CALCIUM 8.9 07/09/2017 0832   PROT 7.5 07/09/2017 0832   ALBUMIN 3.4 (L) 07/09/2017 0832   AST 17 07/09/2017 0832   ALT 19 07/09/2017 0832   ALKPHOS 95 07/09/2017 0832   BILITOT <0.22 07/09/2017 0832   GFRNONAA 104  04/02/2017 1208   GFRAA 120 04/02/2017 1208         ASSESSMENT and PLAN:   Malignant neoplasm of lower-outer quadrant of left breast of female, estrogen receptor negative ([place]) 06/09/2017: Left lumpectomy: IDC grade 3, 1.2 cm, DCIS is present, margins negative, 0/6 lymph nodes negative, ER 0%, PR 0%, HER-2 positive ratio 2.28, Ki-67 20%, T1 BN 0 stage IA  Pathology counseling: I discussed the final pathology report of the patient provided  a copy of this report. I discussed the margins as well as lymph node surgeries. We also discussed the final staging along with previously performed ER/PR and HER-2/neu testing.  Recommendation: 1. Adjuvant chemotherapy with Taxol Herceptin weekly 12 followed by Herceptin every 3 weeks for total of 6 months 2. Followed by adjuvant radiation  Echo on 05/24/17 demonstrates LVEF of 60-65%  ____________________________________________________________________  Erin [name] is doing well today.  Her labs are stable and I reviewed these with her.  She is out of her iron tablets and that is likely why her hemoglobin is low.  She will pick some up.  If her hemoglobin  remains low, we can recheck her iron and possibly give feraheme.  I reviewed this with her and her mom and she is agreeable.  I reviewed her treatments with her today again in detail, along with her medications and she verbalized understanding on everything.  She will proceed with Taxol/Herceptin today.     All questions were answered. The patient knows to call the clinic with any problems, questions or concerns. We can certainly see the patient much sooner if necessary.  A total of (30) minutes of face-to-face time was spent with this patient with greater than 50% of that time in counseling and care-coordination.  This note was electronically signed. Scot Dock, NP 07/09/2017

## 2017-07-09 ENCOUNTER — Ambulatory Visit: Payer: Medicare Other

## 2017-07-09 ENCOUNTER — Ambulatory Visit (HOSPITAL_BASED_OUTPATIENT_CLINIC_OR_DEPARTMENT_OTHER): Payer: Medicaid Other

## 2017-07-09 ENCOUNTER — Other Ambulatory Visit (HOSPITAL_BASED_OUTPATIENT_CLINIC_OR_DEPARTMENT_OTHER): Payer: Medicaid Other

## 2017-07-09 ENCOUNTER — Encounter: Payer: Self-pay | Admitting: *Deleted

## 2017-07-09 ENCOUNTER — Encounter: Payer: Self-pay | Admitting: Adult Health

## 2017-07-09 ENCOUNTER — Ambulatory Visit (HOSPITAL_BASED_OUTPATIENT_CLINIC_OR_DEPARTMENT_OTHER): Payer: Medicaid Other | Admitting: Adult Health

## 2017-07-09 VITALS — BP 152/68 | HR 96 | Temp 97.9°F | Resp 18 | Ht 61.0 in | Wt 161.7 lb

## 2017-07-09 VITALS — BP 127/66 | HR 92 | Temp 98.4°F | Resp 18

## 2017-07-09 DIAGNOSIS — Z5111 Encounter for antineoplastic chemotherapy: Secondary | ICD-10-CM

## 2017-07-09 DIAGNOSIS — C50512 Malignant neoplasm of lower-outer quadrant of left female breast: Secondary | ICD-10-CM

## 2017-07-09 DIAGNOSIS — Z95828 Presence of other vascular implants and grafts: Secondary | ICD-10-CM

## 2017-07-09 DIAGNOSIS — Z171 Estrogen receptor negative status [ER-]: Principal | ICD-10-CM

## 2017-07-09 DIAGNOSIS — Z5112 Encounter for antineoplastic immunotherapy: Secondary | ICD-10-CM

## 2017-07-09 LAB — COMPREHENSIVE METABOLIC PANEL
ALBUMIN: 3.4 g/dL — AB (ref 3.5–5.0)
ALK PHOS: 95 U/L (ref 40–150)
ALT: 19 U/L (ref 0–55)
AST: 17 U/L (ref 5–34)
Anion Gap: 8 mEq/L (ref 3–11)
BUN: 7.6 mg/dL (ref 7.0–26.0)
CALCIUM: 8.9 mg/dL (ref 8.4–10.4)
CO2: 24 mEq/L (ref 22–29)
Chloride: 107 mEq/L (ref 98–109)
Creatinine: 0.7 mg/dL (ref 0.6–1.1)
Glucose: 158 mg/dl — ABNORMAL HIGH (ref 70–140)
POTASSIUM: 3.6 meq/L (ref 3.5–5.1)
SODIUM: 139 meq/L (ref 136–145)
Total Bilirubin: <0.22 mg/dL (ref 0.20–1.20)
Total Protein: 7.5 g/dL (ref 6.4–8.3)

## 2017-07-09 LAB — CBC WITH DIFFERENTIAL/PLATELET
BASO%: 0.1 % (ref 0.0–2.0)
BASOS ABS: 0 10*3/uL (ref 0.0–0.1)
EOS ABS: 0.1 10*3/uL (ref 0.0–0.5)
EOS%: 1.1 % (ref 0.0–7.0)
HEMATOCRIT: 27.1 % — AB (ref 34.8–46.6)
HEMOGLOBIN: 8.3 g/dL — AB (ref 11.6–15.9)
LYMPH#: 2.9 10*3/uL (ref 0.9–3.3)
LYMPH%: 29.5 % (ref 14.0–49.7)
MCH: 26.3 pg (ref 25.1–34.0)
MCHC: 30.6 g/dL — ABNORMAL LOW (ref 31.5–36.0)
MCV: 85.8 fL (ref 79.5–101.0)
MONO#: 0.6 10*3/uL (ref 0.1–0.9)
MONO%: 6.5 % (ref 0.0–14.0)
NEUT#: 6.1 10*3/uL (ref 1.5–6.5)
NEUT%: 62.8 % (ref 38.4–76.8)
Platelets: 273 10*3/uL (ref 145–400)
RBC: 3.16 10*6/uL — ABNORMAL LOW (ref 3.70–5.45)
RDW: 15.8 % — ABNORMAL HIGH (ref 11.2–14.5)
WBC: 9.8 10*3/uL (ref 3.9–10.3)
nRBC: 0 % (ref 0–0)

## 2017-07-09 MED ORDER — DEXAMETHASONE SODIUM PHOSPHATE 10 MG/ML IJ SOLN
INTRAMUSCULAR | Status: AC
  Filled 2017-07-09: qty 1

## 2017-07-09 MED ORDER — SODIUM CHLORIDE 0.9% FLUSH
10.0000 mL | INTRAVENOUS | Status: DC | PRN
  Administered 2017-07-09: 10 mL via INTRAVENOUS
  Filled 2017-07-09: qty 10

## 2017-07-09 MED ORDER — DIPHENHYDRAMINE HCL 50 MG/ML IJ SOLN
INTRAMUSCULAR | Status: AC
  Filled 2017-07-09: qty 1

## 2017-07-09 MED ORDER — FAMOTIDINE IN NACL 20-0.9 MG/50ML-% IV SOLN
INTRAVENOUS | Status: AC
  Filled 2017-07-09: qty 50

## 2017-07-09 MED ORDER — SODIUM CHLORIDE 0.9 % IV SOLN
Freq: Once | INTRAVENOUS | Status: AC
  Administered 2017-07-09: 11:00:00 via INTRAVENOUS

## 2017-07-09 MED ORDER — DEXAMETHASONE SODIUM PHOSPHATE 10 MG/ML IJ SOLN
10.0000 mg | Freq: Once | INTRAMUSCULAR | Status: AC
  Administered 2017-07-09: 10 mg via INTRAVENOUS

## 2017-07-09 MED ORDER — TRASTUZUMAB CHEMO 150 MG IV SOLR
300.0000 mg | Freq: Once | INTRAVENOUS | Status: AC
  Administered 2017-07-09: 300 mg via INTRAVENOUS
  Filled 2017-07-09: qty 14.29

## 2017-07-09 MED ORDER — HEPARIN SOD (PORK) LOCK FLUSH 100 UNIT/ML IV SOLN
500.0000 [IU] | Freq: Once | INTRAVENOUS | Status: AC | PRN
  Administered 2017-07-09: 500 [IU]
  Filled 2017-07-09: qty 5

## 2017-07-09 MED ORDER — ACETAMINOPHEN 325 MG PO TABS
650.0000 mg | ORAL_TABLET | Freq: Once | ORAL | Status: AC
  Administered 2017-07-09: 650 mg via ORAL

## 2017-07-09 MED ORDER — FAMOTIDINE IN NACL 20-0.9 MG/50ML-% IV SOLN
20.0000 mg | Freq: Once | INTRAVENOUS | Status: AC
  Administered 2017-07-09: 20 mg via INTRAVENOUS

## 2017-07-09 MED ORDER — SODIUM CHLORIDE 0.9% FLUSH
10.0000 mL | INTRAVENOUS | Status: DC | PRN
Start: 2017-07-09 — End: 2017-07-09
  Administered 2017-07-09: 10 mL
  Filled 2017-07-09: qty 10

## 2017-07-09 MED ORDER — DIPHENHYDRAMINE HCL 50 MG/ML IJ SOLN
25.0000 mg | Freq: Once | INTRAMUSCULAR | Status: AC
  Administered 2017-07-09: 25 mg via INTRAVENOUS

## 2017-07-09 MED ORDER — ACETAMINOPHEN 325 MG PO TABS
ORAL_TABLET | ORAL | Status: AC
  Filled 2017-07-09: qty 2

## 2017-07-09 MED ORDER — SODIUM CHLORIDE 0.9 % IV SOLN
80.0000 mg/m2 | Freq: Once | INTRAVENOUS | Status: AC
  Administered 2017-07-09: 144 mg via INTRAVENOUS
  Filled 2017-07-09: qty 24

## 2017-07-09 NOTE — Patient Instructions (Addendum)
[facility] Discharge Instructions for Patients Receiving Chemotherapy  Today you received the following chemotherapy agents Herceptin and Taxol  To help prevent nausea and vomiting after your treatment, we encourage you to take your nausea medication as directed If you develop nausea and vomiting that is not controlled by your nausea medication, call the clinic.   BELOW ARE SYMPTOMS THAT SHOULD BE REPORTED IMMEDIATELY:  *FEVER GREATER THAN 100.5 F  *CHILLS WITH OR WITHOUT FEVER  NAUSEA AND VOMITING THAT IS NOT CONTROLLED WITH YOUR NAUSEA MEDICATION  *UNUSUAL SHORTNESS OF BREATH  *UNUSUAL BRUISING OR BLEEDING  TENDERNESS IN MOUTH AND THROAT WITH OR WITHOUT PRESENCE OF ULCERS  *URINARY PROBLEMS  *BOWEL PROBLEMS  UNUSUAL RASH Items with * indicate a potential emergency and should be followed up as soon as possible.  Feel free to call the clinic should you have any questions or concerns. The clinic phone number is [phone].  Please show the [facility] at check-in to the Emergency Department and triage nurse.  Trastuzumab injection for infusion (Herceptin) What is this medicine? TRASTUZUMAB (tras TOO zoo mab) is a monoclonal antibody. It is used to treat breast cancer and stomach cancer. This medicine may be used for other purposes; ask your health care provider or pharmacist if you have questions. COMMON BRAND NAME(S): Herceptin What should I tell my health care provider before I take this medicine? They need to know if you have any of these conditions: -heart disease -heart failure -lung or breathing disease, like asthma -an unusual or allergic reaction to trastuzumab, benzyl alcohol, or other medications, foods, dyes, or preservatives -pregnant or trying to get pregnant -breast-feeding How should I use this medicine? This drug is given as an infusion into a vein. It is administered in a hospital or clinic by a specially trained health care  professional. Talk to your pediatrician regarding the use of this medicine in children. This medicine is not approved for use in children. Overdosage: If you think you have taken too much of this medicine contact a poison control center or emergency room at once. NOTE: This medicine is only for you. Do not share this medicine with others. What if I miss a dose? It is important not to miss a dose. Call your doctor or health care professional if you are unable to keep an appointment. What may interact with this medicine? This medicine may interact with the following medications: -certain types of chemotherapy, such as daunorubicin, doxorubicin, epirubicin, and idarubicin This list may not describe all possible interactions. Give your health care provider a list of all the medicines, herbs, non-prescription drugs, or dietary supplements you use. Also tell them if you smoke, drink alcohol, or use illegal drugs. Some items may interact with your medicine. What should I watch for while using this medicine? Visit your doctor for checks on your progress. Report any side effects. Continue your course of treatment even though you feel ill unless your doctor tells you to stop. Call your doctor or health care professional for advice if you get a fever, chills or sore throat, or other symptoms of a cold or flu. Do not treat yourself. Try to avoid being around people who are sick. You may experience fever, chills and shaking during your first infusion. These effects are usually mild and can be treated with other medicines. Report any side effects during the infusion to your health care professional. Fever and chills usually do not happen with later infusions. Do not become pregnant  while taking this medicine or for 7 months after stopping it. Women should inform their doctor if they wish to become pregnant or think they might be pregnant. Women of child-bearing potential will need to have a negative pregnancy test  before starting this medicine. There is a potential for serious side effects to an unborn child. Talk to your health care professional or pharmacist for more information. Do not breast-feed an infant while taking this medicine or for 7 months after stopping it. Women must use effective birth control with this medicine. What side effects may I notice from receiving this medicine? Side effects that you should report to your doctor or health care professional as soon as possible: -allergic reactions like skin rash, itching or hives, swelling of the face, lips, or tongue -chest pain or palpitations -cough -dizziness -feeling faint or lightheaded, falls -fever -general ill feeling or flu-like symptoms -signs of worsening heart failure like breathing problems; swelling in your legs and feet -unusually weak or tired Side effects that usually do not require medical attention (report to your doctor or health care professional if they continue or are bothersome): -bone pain -changes in taste -diarrhea -joint pain -nausea/vomiting -weight loss This list may not describe all possible side effects. Call your doctor for medical advice about side effects. You may report side effects to FDA at 1-800-FDA-1088. Where should I keep my medicine? This drug is given in a hospital or clinic and will not be stored at home. NOTE: This sheet is a summary. It may not cover all possible information. If you have questions about this medicine, talk to your doctor, pharmacist, or health care provider.  2018 Elsevier/Gold Standard (2016-08-25 14:37:52)  Paclitaxel injection (Taxol) What is this medicine? PACLITAXEL (PAK li TAX el) is a chemotherapy drug. It targets fast dividing cells, like cancer cells, and causes these cells to die. This medicine is used to treat ovarian cancer, breast cancer, and other cancers. This medicine may be used for other purposes; ask your health care provider or pharmacist if you have  questions. COMMON BRAND NAME(S): Onxol, Taxol What should I tell my health care provider before I take this medicine? They need to know if you have any of these conditions: -blood disorders -irregular heartbeat -infection (especially a virus infection such as chickenpox, cold sores, or herpes) -liver disease -previous or ongoing radiation therapy -an unusual or allergic reaction to paclitaxel, alcohol, polyoxyethylated castor oil, other chemotherapy agents, other medicines, foods, dyes, or preservatives -pregnant or trying to get pregnant -breast-feeding How should I use this medicine? This drug is given as an infusion into a vein. It is administered in a hospital or clinic by a specially trained health care professional. Talk to your pediatrician regarding the use of this medicine in children. Special care may be needed. Overdosage: If you think you have taken too much of this medicine contact a poison control center or emergency room at once. NOTE: This medicine is only for you. Do not share this medicine with others. What if I miss a dose? It is important not to miss your dose. Call your doctor or health care professional if you are unable to keep an appointment. What may interact with this medicine? Do not take this medicine with any of the following medications: -disulfiram -metronidazole This medicine may also interact with the following medications: -cyclosporine -diazepam -ketoconazole -medicines to increase blood counts like filgrastim, pegfilgrastim, sargramostim -other chemotherapy drugs like cisplatin, doxorubicin, epirubicin, etoposide, teniposide, vincristine -quinidine -testosterone -vaccines -verapamil Talk  to your doctor or health care professional before taking any of these medicines: -acetaminophen -aspirin -ibuprofen -ketoprofen -naproxen This list may not describe all possible interactions. Give your health care provider a list of all the medicines, herbs,  non-prescription drugs, or dietary supplements you use. Also tell them if you smoke, drink alcohol, or use illegal drugs. Some items may interact with your medicine. What should I watch for while using this medicine? Your condition will be monitored carefully while you are receiving this medicine. You will need important blood work done while you are taking this medicine. This medicine can cause serious allergic reactions. To reduce your risk you will need to take other medicine(s) before treatment with this medicine. If you experience allergic reactions like skin rash, itching or hives, swelling of the face, lips, or tongue, tell your doctor or health care professional right away. In some cases, you may be given additional medicines to help with side effects. Follow all directions for their use. This drug may make you feel generally unwell. This is not uncommon, as chemotherapy can affect healthy cells as well as cancer cells. Report any side effects. Continue your course of treatment even though you feel ill unless your doctor tells you to stop. Call your doctor or health care professional for advice if you get a fever, chills or sore throat, or other symptoms of a cold or flu. Do not treat yourself. This drug decreases your body's ability to fight infections. Try to avoid being around people who are sick. This medicine may increase your risk to bruise or bleed. Call your doctor or health care professional if you notice any unusual bleeding. Be careful brushing and flossing your teeth or using a toothpick because you may get an infection or bleed more easily. If you have any dental work done, tell your dentist you are receiving this medicine. Avoid taking products that contain aspirin, acetaminophen, ibuprofen, naproxen, or ketoprofen unless instructed by your doctor. These medicines may hide a fever. Do not become pregnant while taking this medicine. Women should inform their doctor if they wish to  become pregnant or think they might be pregnant. There is a potential for serious side effects to an unborn child. Talk to your health care professional or pharmacist for more information. Do not breast-feed an infant while taking this medicine. Men are advised not to father a child while receiving this medicine. This product may contain alcohol. Ask your pharmacist or healthcare provider if this medicine contains alcohol. Be sure to tell all healthcare providers you are taking this medicine. Certain medicines, like metronidazole and disulfiram, can cause an unpleasant reaction when taken with alcohol. The reaction includes flushing, headache, nausea, vomiting, sweating, and increased thirst. The reaction can last from 30 minutes to several hours. What side effects may I notice from receiving this medicine? Side effects that you should report to your doctor or health care professional as soon as possible: -allergic reactions like skin rash, itching or hives, swelling of the face, lips, or tongue -low blood counts - This drug may decrease the number of white blood cells, red blood cells and platelets. You may be at increased risk for infections and bleeding. -signs of infection - fever or chills, cough, sore throat, pain or difficulty passing urine -signs of decreased platelets or bleeding - bruising, pinpoint red spots on the skin, black, tarry stools, nosebleeds -signs of decreased red blood cells - unusually weak or tired, fainting spells, lightheadedness -breathing problems -chest pain -high or  low blood pressure -mouth sores -nausea and vomiting -pain, swelling, redness or irritation at the injection site -pain, tingling, numbness in the hands or feet -slow or irregular heartbeat -swelling of the ankle, feet, hands Side effects that usually do not require medical attention (report to your doctor or health care professional if they continue or are bothersome): -bone pain -complete hair loss  including hair on your head, underarms, pubic hair, eyebrows, and eyelashes -changes in the color of fingernails -diarrhea -loosening of the fingernails -loss of appetite -muscle or joint pain -red flush to skin -sweating This list may not describe all possible side effects. Call your doctor for medical advice about side effects. You may report side effects to FDA at 1-800-FDA-1088. Where should I keep my medicine? This drug is given in a hospital or clinic and will not be stored at home. NOTE: This sheet is a summary. It may not cover all possible information. If you have questions about this medicine, talk to your doctor, pharmacist, or health care provider.  2018 Elsevier/Gold Standard (2015-07-02 19:58:00)

## 2017-07-09 NOTE — Progress Notes (Signed)
Discharge instructions reviewed with the pt and family with interpreter at bedside, pt demonstrated understanding, pt stable upon discharge.

## 2017-07-09 NOTE — Progress Notes (Signed)
Port flushed with problems.  Labs drawn.

## 2017-07-09 NOTE — Patient Instructions (Signed)

## 2017-07-12 ENCOUNTER — Telehealth: Payer: Self-pay

## 2017-07-12 NOTE — Telephone Encounter (Signed)
Spoke with pt via sign language interpreter. Pt was having some pelvic pressure last night and was better this morning. Pt states that she had been feeling constipated over the weekend and feels very uncomfortable. Pt did not take anything for it yet until she heard from the dr.   Rockey Situ pt to take miralax BID with plenty of fluids today and for the next few days. Pt verbalized understanding and will do as instructed. Told pt if she does not have any relief, she may try over the counter laxative or call us for more instructions.Pt verbalized understanding.  Pt reports that she is doing well post 1st time chemo over the weekend. Eating and drinking plenty. No issues with n/v. Pt states that energy level is good as well. Denies any other complaints aside from her constipation.

## 2017-07-13 ENCOUNTER — Other Ambulatory Visit: Payer: Self-pay | Admitting: Family Medicine

## 2017-07-13 DIAGNOSIS — Z853 Personal history of malignant neoplasm of breast: Secondary | ICD-10-CM

## 2017-07-13 DIAGNOSIS — N926 Irregular menstruation, unspecified: Secondary | ICD-10-CM

## 2017-07-13 DIAGNOSIS — N83292 Other ovarian cyst, left side: Secondary | ICD-10-CM

## 2017-07-13 MED FILL — FERROUS SULFATE 325 MG TAB: 325 (65 FE) | 30 days supply | Qty: 30 | Fill #1 | Status: TO

## 2017-07-14 ENCOUNTER — Telehealth: Payer: Self-pay | Admitting: *Deleted

## 2017-07-14 ENCOUNTER — Telehealth: Payer: Self-pay

## 2017-07-14 NOTE — Telephone Encounter (Signed)
-----   Message from [name], [place] sent at 07/13/2017  8:27 PM EDT ----- Large complex ovarian cyst to left ovary. Right ovary normal. No uterine mass You will be referred to gynecology.

## 2017-07-14 NOTE — Telephone Encounter (Signed)
CMA call regarding lab results   Patient mother verify DOB   Patient mother was aware and understood

## 2017-07-14 NOTE — Telephone Encounter (Signed)
Sign language interpreter call from patient.  1.  I would like to know if I can receive the flu vaccine? 2. I have a port-a-cath.  Could Dr. Geralyn Flash nurse assist me with a CD of my chest for airport screening?  Flying in December and want to make sure I do not have any problems. '  Will notify providers.  Added appointment note request for Flu vaccine tomorrrow.  (TSA) Transportation Security Administration should to confirm what is required for this implanted device.  Denies further questions or needs at this time.

## 2017-07-15 MED FILL — ?ATORVASTATIN 40MG TABLET: 40 | 30 days supply | Qty: 30 | Fill #3 | Status: TO

## 2017-07-15 MED FILL — LISINOPRIL 2.5 MG TABLET: 2.5 | 30 days supply | Qty: 30 | Fill #1 | Status: TO

## 2017-07-15 MED FILL — METFORMIN HCL ER 500 MG TAB: 500 | 30 days supply | Qty: 60 | Fill #3 | Status: TO

## 2017-07-16 ENCOUNTER — Encounter: Payer: Self-pay | Admitting: Adult Health

## 2017-07-16 ENCOUNTER — Other Ambulatory Visit (HOSPITAL_BASED_OUTPATIENT_CLINIC_OR_DEPARTMENT_OTHER): Payer: Medicaid Other

## 2017-07-16 ENCOUNTER — Ambulatory Visit (HOSPITAL_BASED_OUTPATIENT_CLINIC_OR_DEPARTMENT_OTHER): Payer: Medicaid Other

## 2017-07-16 ENCOUNTER — Other Ambulatory Visit: Payer: Self-pay

## 2017-07-16 ENCOUNTER — Ambulatory Visit: Payer: Medicare Other

## 2017-07-16 ENCOUNTER — Ambulatory Visit (HOSPITAL_COMMUNITY)
Admission: RE | Admit: 2017-07-16 | Discharge: 2017-07-16 | Disposition: A | Discharge status: Home / Self Care | Payer: Medicare Other | Source: Ambulatory Visit | Attending: Hematology and Oncology | Admitting: Hematology and Oncology

## 2017-07-16 ENCOUNTER — Ambulatory Visit (HOSPITAL_BASED_OUTPATIENT_CLINIC_OR_DEPARTMENT_OTHER): Payer: Medicare Other | Admitting: Adult Health

## 2017-07-16 VITALS — BP 123/72 | HR 94 | Temp 98.0°F | Resp 17

## 2017-07-16 VITALS — BP 131/54 | HR 97 | Temp 97.8°F | Resp 18 | Ht 61.0 in | Wt 164.5 lb

## 2017-07-16 DIAGNOSIS — Z5112 Encounter for antineoplastic immunotherapy: Secondary | ICD-10-CM

## 2017-07-16 DIAGNOSIS — D538 Other specified nutritional anemias: Secondary | ICD-10-CM

## 2017-07-16 DIAGNOSIS — C50512 Malignant neoplasm of lower-outer quadrant of left female breast: Secondary | ICD-10-CM

## 2017-07-16 DIAGNOSIS — Z23 Encounter for immunization: Secondary | ICD-10-CM | POA: Diagnosis not present

## 2017-07-16 DIAGNOSIS — D6481 Anemia due to antineoplastic chemotherapy: Secondary | ICD-10-CM | POA: Diagnosis not present

## 2017-07-16 DIAGNOSIS — E119 Type 2 diabetes mellitus without complications: Secondary | ICD-10-CM

## 2017-07-16 DIAGNOSIS — Z95828 Presence of other vascular implants and grafts: Secondary | ICD-10-CM

## 2017-07-16 DIAGNOSIS — Z5111 Encounter for antineoplastic chemotherapy: Secondary | ICD-10-CM | POA: Diagnosis not present

## 2017-07-16 DIAGNOSIS — Z171 Estrogen receptor negative status [ER-]: Secondary | ICD-10-CM | POA: Diagnosis not present

## 2017-07-16 DIAGNOSIS — G609 Hereditary and idiopathic neuropathy, unspecified: Secondary | ICD-10-CM | POA: Diagnosis not present

## 2017-07-16 DIAGNOSIS — T451X5A Adverse effect of antineoplastic and immunosuppressive drugs, initial encounter: Secondary | ICD-10-CM | POA: Diagnosis present

## 2017-07-16 DIAGNOSIS — D649 Anemia, unspecified: Secondary | ICD-10-CM

## 2017-07-16 LAB — CBC WITH DIFFERENTIAL/PLATELET
BASO%: 0.1 % (ref 0.0–2.0)
BASOS ABS: 0 10*3/uL (ref 0.0–0.1)
EOS%: 1.4 % (ref 0.0–7.0)
Eosinophils Absolute: 0.1 10*3/uL (ref 0.0–0.5)
HCT: 24.3 % — ABNORMAL LOW (ref 34.8–46.6)
HEMOGLOBIN: 7.4 g/dL — AB (ref 11.6–15.9)
LYMPH%: 30.2 % (ref 14.0–49.7)
MCH: 26.1 pg (ref 25.1–34.0)
MCHC: 30.5 g/dL — ABNORMAL LOW (ref 31.5–36.0)
MCV: 85.9 fL (ref 79.5–101.0)
MONO#: 0.4 10*3/uL (ref 0.1–0.9)
MONO%: 4.3 % (ref 0.0–14.0)
NEUT%: 64 % (ref 38.4–76.8)
NEUTROS ABS: 6 10*3/uL (ref 1.5–6.5)
Platelets: 275 10*3/uL (ref 145–400)
RBC: 2.83 10*6/uL — ABNORMAL LOW (ref 3.70–5.45)
RDW: 16.1 % — AB (ref 11.2–14.5)
WBC: 9.4 10*3/uL (ref 3.9–10.3)
lymph#: 2.8 10*3/uL (ref 0.9–3.3)

## 2017-07-16 LAB — COMPREHENSIVE METABOLIC PANEL
ALBUMIN: 3.2 g/dL — AB (ref 3.5–5.0)
ALK PHOS: 79 U/L (ref 40–150)
ALT: 19 U/L (ref 0–55)
AST: 14 U/L (ref 5–34)
Anion Gap: 6 mEq/L (ref 3–11)
BUN: 5.8 mg/dL — AB (ref 7.0–26.0)
CO2: 23 mEq/L (ref 22–29)
Calcium: 8.5 mg/dL (ref 8.4–10.4)
Chloride: 109 mEq/L (ref 98–109)
Creatinine: 0.7 mg/dL (ref 0.6–1.1)
EGFR: >60 mL/min/{1.73_m2} (ref >60)
GLUCOSE: 128 mg/dL (ref 70–140)
POTASSIUM: 3.7 meq/L (ref 3.5–5.1)
SODIUM: 138 meq/L (ref 136–145)
Total Bilirubin: <0.22 mg/dL (ref 0.20–1.20)
Total Protein: 7 g/dL (ref 6.4–8.3)

## 2017-07-16 LAB — PREPARE RBC (CROSSMATCH)

## 2017-07-16 MED ORDER — ACETAMINOPHEN 325 MG PO TABS
ORAL_TABLET | ORAL | Status: AC
  Filled 2017-07-16: qty 2

## 2017-07-16 MED ORDER — SODIUM CHLORIDE 0.9% FLUSH
10.0000 mL | INTRAVENOUS | Status: AC | PRN
  Administered 2017-07-16: 10 mL
  Filled 2017-07-16: qty 10

## 2017-07-16 MED ORDER — SODIUM CHLORIDE 0.9 % IV SOLN
80.0000 mg/m2 | Freq: Once | INTRAVENOUS | Status: AC
  Administered 2017-07-16: 144 mg via INTRAVENOUS
  Filled 2017-07-16: qty 24

## 2017-07-16 MED ORDER — DIPHENHYDRAMINE HCL 25 MG PO CAPS
ORAL_CAPSULE | ORAL | Status: AC
  Filled 2017-07-16: qty 1

## 2017-07-16 MED ORDER — FAMOTIDINE IN NACL 20-0.9 MG/50ML-% IV SOLN
INTRAVENOUS | Status: AC
Start: 2017-07-16 — End: 2017-07-16
  Filled 2017-07-16: qty 50

## 2017-07-16 MED ORDER — DEXAMETHASONE SODIUM PHOSPHATE 10 MG/ML IJ SOLN
10.0000 mg | Freq: Once | INTRAMUSCULAR | Status: AC
  Administered 2017-07-16: 10 mg via INTRAVENOUS

## 2017-07-16 MED ORDER — ACETAMINOPHEN 325 MG PO TABS
650.0000 mg | ORAL_TABLET | Freq: Once | ORAL | Status: AC
  Administered 2017-07-16: 650 mg via ORAL

## 2017-07-16 MED ORDER — DIPHENHYDRAMINE HCL 50 MG/ML IJ SOLN
INTRAMUSCULAR | Status: AC
  Filled 2017-07-16: qty 1

## 2017-07-16 MED ORDER — DIPHENHYDRAMINE HCL 50 MG/ML IJ SOLN
25.0000 mg | Freq: Once | INTRAMUSCULAR | Status: AC
  Administered 2017-07-16: 25 mg via INTRAVENOUS

## 2017-07-16 MED ORDER — SODIUM CHLORIDE 0.9 % IV SOLN
Freq: Once | INTRAVENOUS | Status: AC
  Administered 2017-07-16: 10:00:00 via INTRAVENOUS

## 2017-07-16 MED ORDER — INFLUENZA VAC SPLIT QUAD 0.5 ML IM SUSY
0.5000 mL | PREFILLED_SYRINGE | Freq: Once | INTRAMUSCULAR | Status: AC
  Administered 2017-07-16: 0.5 mL via INTRAMUSCULAR
  Filled 2017-07-16: qty 0.5

## 2017-07-16 MED ORDER — DEXAMETHASONE SODIUM PHOSPHATE 10 MG/ML IJ SOLN
INTRAMUSCULAR | Status: AC
  Filled 2017-07-16: qty 1

## 2017-07-16 MED ORDER — INFLUENZA VAC SPLIT QUAD 0.5 ML IM SUSY: 0.5000 mL | PREFILLED_SYRINGE | Freq: Once | INTRAMUSCULAR | Status: DC

## 2017-07-16 MED ORDER — FAMOTIDINE IN NACL 20-0.9 MG/50ML-% IV SOLN
20.0000 mg | Freq: Once | INTRAVENOUS | Status: AC
  Administered 2017-07-16: 20 mg via INTRAVENOUS

## 2017-07-16 MED ORDER — SODIUM CHLORIDE 0.9% FLUSH
10.0000 mL | INTRAVENOUS | Status: DC | PRN
  Administered 2017-07-16: 10 mL via INTRAVENOUS
  Filled 2017-07-16: qty 10

## 2017-07-16 MED ORDER — DIPHENHYDRAMINE HCL 25 MG PO CAPS
25.0000 mg | ORAL_CAPSULE | Freq: Once | ORAL | Status: AC
  Administered 2017-07-16: 25 mg via ORAL

## 2017-07-16 MED ORDER — SODIUM CHLORIDE 0.9 % IV SOLN: 250.0000 mL | Freq: Once | INTRAVENOUS | Status: DC

## 2017-07-16 MED ORDER — TRASTUZUMAB CHEMO 150 MG IV SOLR
150.0000 mg | Freq: Once | INTRAVENOUS | Status: AC
  Administered 2017-07-16: 150 mg via INTRAVENOUS
  Filled 2017-07-16: qty 7.14

## 2017-07-16 NOTE — Progress Notes (Signed)
Ok to treat with todays labs. Hgb 7.4. Pt will receive 1 unit of blood today per May, RN .  Cyndia Bent RN

## 2017-07-16 NOTE — Assessment & Plan Note (Signed)
06/09/2017: Left lumpectomy: IDC grade 3, 1.2 cm, DCIS is present, margins negative, 0/6 lymph nodes negative, ER 0%, PR 0%, HER-2 positive ratio 2.28, Ki-67 20%, T1 BN 0 stage IA  Pathology counseling: I discussed the final pathology report of the patient provided  a copy of this report. I discussed the margins as well as lymph node surgeries. We also discussed the final staging along with previously performed ER/PR and HER-2/neu testing.  Recommendation: 1. Adjuvant chemotherapy with Taxol Herceptin weekly 12 followed by Herceptin every 3 weeks for total of 6 months 2. Followed by adjuvant radiation  Echo on 05/24/17 demonstrates LVEF of 60-65%  ____________________________________________________________________  [name] is doing well today.  Her hemoglobin is lower today.  She needs to receive one unit of blood today with chemotherapy.  We will see where she is next week.  She does have h/o iron deficiency and had stopped taking her supplements, iron levels are pending.  She has restarted oral iron, however may need feraheme.  We will see her back next week for labs and evaluation.

## 2017-07-16 NOTE — Progress Notes (Unsigned)
Pt hg 7.4 today. Okay to treat per Dr.Gudena. Pt to receive 1 unit of blood after chemo today. Notified pharmacy and infusion RN.

## 2017-07-16 NOTE — Progress Notes (Signed)
[facility] Follow up:    Erin Spruce, FNP [place] [address]   DIAGNOSIS: Cancer Staging Malignant neoplasm of lower-outer quadrant of left breast of female, estrogen receptor negative ([place]) Staging form: Breast, AJCC 8th Edition - Clinical stage from 05/18/2017: Stage IA (cT1b, cN0, cM0, G3, ER: Negative, PR: Negative, HER2: Positive) - Unsigned - Pathologic: Stage IA (pT1c, pN0, cM0, G3, ER: Negative, PR: Negative, HER2: Positive) - Signed by [name], NP on 07/07/2017   SUMMARY OF ONCOLOGIC HISTORY: Oncology History   t      Malignant neoplasm of lower-outer quadrant of left breast of female, estrogen receptor negative ([name])   05/11/2017 Initial Diagnosis    Left breast biopsy 3:30 position 6 cm from nipple: IDC with DCIS, lymphovascular invasion present, grade 2-3, ER 0%, PR 0%, HER-2 positive ratio 2.28, Ki-67 20%, 1 cm lesion left breast, T1b N0 stage IA clinical stage      06/09/2017 Surgery    Left lumpectomy: IDC grade 3, 1.2 cm, DCIS is present, margins negative, 0/6 lymph nodes negative, ER 0%, PR 0%, HER-2 positive ratio 2.28, Ki-67 20%, T1 BN 0 stage IA      07/09/2017 -  Adjuvant Chemotherapy    Taxol/Herceptin       CURRENT THERAPY: Taxol/Herceptin  INTERVAL HISTORY: Erin [name] 56 y.o. female returns for evaluaiton prior to her second week of Taxol/Herceptin.  She is accompanied by her mom, sister, and sign language interpreter.  She tolerated her first cycle of Taxol/herceptin well.   She is anemic today.  She has some mild DOE and fatigue.  She is otherwise doing well and is tolerating treatment.  She denies any other issues today, particularly peripheral neuropathy.     Patient Active Problem List   Diagnosis Date Noted  . Malignant neoplasm of lower-outer quadrant of left breast of female, estrogen receptor negative (Erin [name]) 05/13/2017  . Abdominal bloating 04/02/2017  . Chronic  right-sided low back pain without sciatica 07/02/2016  . DM type 2 (diabetes mellitus, type 2) ([place]) 11/18/2014  . Intermittent constipation 06/06/2010  . UNSPECIFIED IRON DEFICIENCY ANEMIA 04/22/2010  . DEPRESSION 04/10/2010  . DEAFNESS 04/10/2010    has No Known Allergies.  MEDICAL HISTORY: Past Medical History:  Diagnosis Date  . Breast cancer (Erin [name]) 05/2017   left  . Cough 06/03/2017  . Deaf   . Dyspnea    states SOB with ADLs and at night - no home O2  . High cholesterol   . Non-insulin dependent type 2 diabetes mellitus ([place])   . Runny nose 06/03/2017   clear drainage, per pt.    SURGICAL HISTORY: Past Surgical History:  Procedure Laterality Date  . BREAST LUMPECTOMY WITH RADIOACTIVE SEED AND SENTINEL LYMPH NODE BIOPSY Left 06/09/2017   Procedure: BREAST LUMPECTOMY WITH RADIOACTIVE SEED AND SENTINEL LYMPH NODE BIOPSY;  Surgeon: Excell Seltzer, MD;  Location: [facility];  Service: General;  Laterality: Left;  . PORTACATH PLACEMENT Right 06/09/2017   Procedure: INSERTION PORT-A-CATH WITH [place];  Surgeon: Excell Seltzer, MD;  Location: [facility];  Service: General;  Laterality: Right;  . TUBAL LIGATION  02/04/2002    SOCIAL HISTORY: Social History   Social History  . Marital status: Legally Separated    Spouse name: N/A  . Number of children: N/A  . Years of education: N/A   Occupational History  . Not on file.   Social History Main Topics  . Smoking  status: Never Smoker  . Smokeless tobacco: Never Used  . Alcohol use No  . Drug use: No  . Sexual activity: No   Other Topics Concern  . Not on file   Social History Narrative  . No narrative on file    FAMILY HISTORY: Family History  Problem Relation Age of Onset  . Hypertension Mother   . Diabetes Father   . Hypertension Father   . Hypertension Sister   . Cancer Paternal Grandmother   . Diabetes Paternal Grandmother     Review of Systems  Constitutional:  Positive for fatigue. Negative for appetite change, chills, fever and unexpected weight change.  HENT:   Negative for hearing loss, lump/mass and trouble swallowing.   Eyes: Negative for eye problems and icterus.  Respiratory: Negative for chest tightness, cough and shortness of breath.   Cardiovascular: Negative for chest pain, leg swelling and palpitations.  Gastrointestinal: Negative for abdominal distention, abdominal pain, constipation, diarrhea, nausea and vomiting.  Endocrine: Negative for hot flashes.  Musculoskeletal: Negative for arthralgias.  Skin: Negative for itching and rash.  Neurological: Negative for dizziness, extremity weakness, headaches and numbness.  Hematological: Negative for adenopathy. Does not bruise/bleed easily.  Psychiatric/Behavioral: Negative for depression. The patient is not nervous/anxious.       PHYSICAL EXAMINATION  ECOG PERFORMANCE STATUS: 1 - Symptomatic but completely ambulatory  Vitals:   07/16/17 0858  BP: (!) 131/54  Pulse: 97  Resp: 18  Temp: 97.8 F (36.6 C)  SpO2: 100%    Physical Exam  Constitutional: She is oriented to person, place, and time and well-developed, well-nourished, and in no distress.  HENT:  Head: Normocephalic and atraumatic.  Mouth/Throat: Oropharynx is clear and moist. No oropharyngeal exudate.  Eyes: Pupils are equal, round, and reactive to light. No scleral icterus.  Neck: Neck supple.  Cardiovascular: Normal rate, regular rhythm, normal heart sounds and intact distal pulses.   Pulmonary/Chest: Effort normal and breath sounds normal. No respiratory distress. She has no wheezes. She has no rales.  Abdominal: Soft. Bowel sounds are normal.  Musculoskeletal: She exhibits no edema.  Lymphadenopathy:    She has no cervical adenopathy.  Neurological: She is alert and oriented to person, place, and time.  Skin: Skin is warm and dry. No rash noted.  Psychiatric: Mood and affect normal.    LABORATORY  DATA:  CBC    Component Value Date/Time   WBC 9.4 07/16/2017 0827   WBC 10.3 10/12/2014 1257   RBC 2.83 (L) 07/16/2017 0827   RBC 4.07 10/12/2014 1257   HGB 7.4 (L) 07/16/2017 0827   HCT 24.3 (L) 07/16/2017 0827   PLT 275 07/16/2017 0827   PLT 375 05/26/2017 0918   MCV 85.9 07/16/2017 0827   MCH 26.1 07/16/2017 0827   MCH 26.5 10/12/2014 1257   MCHC 30.5 (L) 07/16/2017 0827   MCHC 31.9 10/12/2014 1257   RDW 16.1 (H) 07/16/2017 0827   LYMPHSABS 2.8 07/16/2017 0827   MONOABS 0.4 07/16/2017 0827   EOSABS 0.1 07/16/2017 0827   EOSABS 0.1 05/26/2017 0918   BASOSABS 0.0 07/16/2017 0827    CMP     Component Value Date/Time   NA 138 07/16/2017 0827   K 3.7 07/16/2017 0827   CL 103 04/02/2017 1208   CO2 23 07/16/2017 0827   GLUCOSE 128 07/16/2017 0827   BUN 5.8 (L) 07/16/2017 0827   CREATININE 0.7 07/16/2017 0827   CALCIUM 8.5 07/16/2017 0827   PROT 7.0 07/16/2017 0827   ALBUMIN  3.2 (L) 07/16/2017 0827   AST 14 07/16/2017 0827   ALT 19 07/16/2017 0827   ALKPHOS 79 07/16/2017 0827   BILITOT <0.22 07/16/2017 0827   GFRNONAA 104 04/02/2017 1208   GFRAA 120 04/02/2017 1208      ASSESSMENT and PLAN:   Malignant neoplasm of lower-outer quadrant of left breast of female, estrogen receptor negative ([place]) 06/09/2017: Left lumpectomy: IDC grade 3, 1.2 cm, DCIS is present, margins negative, 0/6 lymph nodes negative, ER 0%, PR 0%, HER-2 positive ratio 2.28, Ki-67 20%, T1 BN 0 stage IA  Pathology counseling: I discussed the final pathology report of the patient provided  a copy of this report. I discussed the margins as well as lymph node surgeries. We also discussed the final staging along with previously performed ER/PR and HER-2/neu testing.  Recommendation: 1. Adjuvant chemotherapy with Taxol Herceptin weekly 12 followed by Herceptin every 3 weeks for total of 6 months 2. Followed by adjuvant radiation  Echo on 05/24/17 demonstrates LVEF of  60-65%  ____________________________________________________________________  Erin [name] is doing well today.  Her hemoglobin is lower today.  She needs to receive one unit of blood today with chemotherapy.  We will see where she is next week.  She does have h/o iron deficiency and had stopped taking her supplements, iron levels are pending.  She has restarted oral iron, however may need feraheme.  We will see her back next week for labs and evaluation.      All questions were answered. The patient knows to call the clinic with any problems, questions or concerns. We can certainly see the patient much sooner if necessary.  A total of (30) minutes of face-to-face time was spent with this patient with greater than 50% of that time in counseling and care-coordination.  This note was electronically signed. Scot Dock, NP 07/16/2017

## 2017-07-16 NOTE — Patient Instructions (Addendum)
[facility] Discharge Instructions for Patients Receiving Chemotherapy  Today you received the following chemotherapy agents Herceptin, Taxol.    To help prevent nausea and vomiting after your treatment, we encourage you to take your nausea medication as prescribed.     If you develop nausea and vomiting that is not controlled by your nausea medication, call the clinic.   BELOW ARE SYMPTOMS THAT SHOULD BE REPORTED IMMEDIATELY:  *FEVER GREATER THAN 100.5 F  *CHILLS WITH OR WITHOUT FEVER  NAUSEA AND VOMITING THAT IS NOT CONTROLLED WITH YOUR NAUSEA MEDICATION  *UNUSUAL SHORTNESS OF BREATH  *UNUSUAL BRUISING OR BLEEDING  TENDERNESS IN MOUTH AND THROAT WITH OR WITHOUT PRESENCE OF ULCERS  *URINARY PROBLEMS  *BOWEL PROBLEMS  UNUSUAL RASH Items with * indicate a potential emergency and should be followed up as soon as possible.  Feel free to call the clinic should you have any questions or concerns. The clinic phone number is [phone].  Please show the [facility] at check-in to the Emergency Department and triage nurse.     Blood Transfusion, Care After This sheet gives you information about how to care for yourself after your procedure. Your doctor may also give you more specific instructions. If you have problems or questions, contact your doctor. Follow these instructions at home:  Take over-the-counter and prescription medicines only as told by your doctor.  Go back to your normal activities as told by your doctor.  Follow instructions from your doctor about how to take care of the area where an IV tube was put into your vein (insertion site). Make sure you: ? Wash your hands with soap and water before you change your bandage (dressing). If there is no soap and water, use hand sanitizer. ? Change your bandage as told by your doctor.  Check your IV insertion site every day for signs of infection. Check for: ? More redness, swelling, or  pain. ? More fluid or blood. ? Warmth. ? Pus or a bad smell. Contact a doctor if:  You have more redness, swelling, or pain around the IV insertion site..  You have more fluid or blood coming from the IV insertion site.  Your IV insertion site feels warm to the touch.  You have pus or a bad smell coming from the IV insertion site.  Your pee (urine) turns pink, red, or brown.  You feel weak after doing your normal activities. Get help right away if:  You have signs of a serious allergic or body defense (immune) system reaction, including: ? Itchiness. ? Hives. ? Trouble breathing. ? Anxiety. ? Pain in your chest or lower back. ? Fever, flushing, and chills. ? Fast pulse. ? Rash. ? Watery poop (diarrhea). ? Throwing up (vomiting). ? Dark pee. ? Serious headache. ? Dizziness. ? Stiff neck. ? Yellow color in your face or the white parts of your eyes (jaundice). Summary  After a blood transfusion, return to your normal activities as told by your doctor.  Every day, check for signs of infection where the IV tube was put into your vein.  Some signs of infection are warm skin, more redness and pain, more fluid or blood, and pus or a bad smell where the needle went in.  Contact your doctor if you feel weak or have any unusual symptoms. This information is not intended to replace advice given to you by your health care provider. Make sure you discuss any questions you have with your health care provider. Document  Released: 09/21/2014 Document Revised: 04/24/2016 Document Reviewed: 04/24/2016 Elsevier Interactive Patient Education  2017 [employer].

## 2017-07-16 NOTE — Patient Instructions (Signed)
Implanted Port Home Guide An implanted port is a type of central line that is placed under the skin. Central lines are used to provide IV access when treatment or nutrition needs to be given through a person's veins. Implanted ports are used for long-term IV access. An implanted port may be placed because:  You need IV medicine that would be irritating to the small veins in your hands or arms.  You need long-term IV medicines, such as antibiotics.  You need IV nutrition for a long period.  You need frequent blood draws for lab tests.  You need dialysis.  Implanted ports are usually placed in the chest area, but they can also be placed in the upper arm, the abdomen, or the leg. An implanted port has two main parts:  Reservoir. The reservoir is round and will appear as a small, raised area under your skin. The reservoir is the part where a needle is inserted to give medicines or draw blood.  Catheter. The catheter is a thin, flexible tube that extends from the reservoir. The catheter is placed into a large vein. Medicine that is inserted into the reservoir goes into the catheter and then into the vein.  How will I care for my incision site? Do not get the incision site wet. Bathe or shower as directed by your health care provider. How is my port accessed? Special steps must be taken to access the port:  Before the port is accessed, a numbing cream can be placed on the skin. This helps numb the skin over the port site.  Your health care provider uses a sterile technique to access the port. ? Your health care provider must put on a mask and sterile gloves. ? The skin over your port is cleaned carefully with an antiseptic and allowed to dry. ? The port is gently pinched between sterile gloves, and a needle is inserted into the port.  Only "non-coring" port needles should be used to access the port. Once the port is accessed, a blood return should be checked. This helps ensure that the port  is in the vein and is not clogged.  If your port needs to remain accessed for a constant infusion, a clear (transparent) bandage will be placed over the needle site. The bandage and needle will need to be changed every week, or as directed by your health care provider.  Keep the bandage covering the needle clean and dry. Do not get it wet. Follow your health care provider's instructions on how to take a shower or bath while the port is accessed.  If your port does not need to stay accessed, no bandage is needed over the port.  What is flushing? Flushing helps keep the port from getting clogged. Follow your health care provider's instructions on how and when to flush the port. Ports are usually flushed with saline solution or a medicine called heparin. The need for flushing will depend on how the port is used.  If the port is used for intermittent medicines or blood draws, the port will need to be flushed: ? After medicines have been given. ? After blood has been drawn. ? As part of routine maintenance.  If a constant infusion is running, the port may not need to be flushed.  How long will my port stay implanted? The port can stay in for as long as your health care provider thinks it is needed. When it is time for the port to come out, surgery will be   done to remove it. The procedure is similar to the one performed when the port was put in. When should I seek immediate medical care? When you have an implanted port, you should seek immediate medical care if:  You notice a bad smell coming from the incision site.  You have swelling, redness, or drainage at the incision site.  You have more swelling or pain at the port site or the surrounding area.  You have a fever that is not controlled with medicine.  This information is not intended to replace advice given to you by your health care provider. Make sure you discuss any questions you have with your health care provider. Document  Released: 08/31/2005 Document Revised: 02/06/2016 Document Reviewed: 05/08/2013 Elsevier Interactive Patient Education  2017 [employer]  

## 2017-07-17 LAB — TYPE AND SCREEN
ABO/RH(D): A POS
Antibody Screen: POSITIVE
PT AG Type: NEGATIVE
UNIT DIVISION: 0

## 2017-07-17 LAB — BPAM RBC
Blood Product Expiration Date: 201811212359
ISSUE DATE / TIME: 201811021417
Unit Type and Rh: 6200

## 2017-07-22 NOTE — Telephone Encounter (Signed)
Patient was seen in September and [facility] in [place] is not on call that month does why I refer her to [facility]  She needs to be ssen the month of [facility] is on call such is December, February , April evry other month thank you

## 2017-07-23 ENCOUNTER — Encounter: Payer: Self-pay | Admitting: Adult Health

## 2017-07-23 ENCOUNTER — Ambulatory Visit (HOSPITAL_BASED_OUTPATIENT_CLINIC_OR_DEPARTMENT_OTHER): Payer: Medicaid Other | Admitting: Adult Health

## 2017-07-23 ENCOUNTER — Ambulatory Visit: Payer: Medicare Other

## 2017-07-23 ENCOUNTER — Ambulatory Visit (HOSPITAL_BASED_OUTPATIENT_CLINIC_OR_DEPARTMENT_OTHER): Payer: Medicaid Other

## 2017-07-23 ENCOUNTER — Telehealth: Payer: Self-pay | Admitting: Adult Health

## 2017-07-23 ENCOUNTER — Other Ambulatory Visit (HOSPITAL_BASED_OUTPATIENT_CLINIC_OR_DEPARTMENT_OTHER): Payer: Medicaid Other

## 2017-07-23 VITALS — BP 135/61 | HR 82 | Temp 98.0°F | Resp 18 | Ht 61.0 in | Wt 166.1 lb

## 2017-07-23 DIAGNOSIS — Z5112 Encounter for antineoplastic immunotherapy: Secondary | ICD-10-CM

## 2017-07-23 DIAGNOSIS — Z171 Estrogen receptor negative status [ER-]: Secondary | ICD-10-CM

## 2017-07-23 DIAGNOSIS — T451X5A Adverse effect of antineoplastic and immunosuppressive drugs, initial encounter: Secondary | ICD-10-CM

## 2017-07-23 DIAGNOSIS — C50512 Malignant neoplasm of lower-outer quadrant of left female breast: Secondary | ICD-10-CM

## 2017-07-23 DIAGNOSIS — Z5111 Encounter for antineoplastic chemotherapy: Secondary | ICD-10-CM

## 2017-07-23 DIAGNOSIS — D538 Other specified nutritional anemias: Secondary | ICD-10-CM

## 2017-07-23 DIAGNOSIS — Z95828 Presence of other vascular implants and grafts: Secondary | ICD-10-CM

## 2017-07-23 DIAGNOSIS — D6481 Anemia due to antineoplastic chemotherapy: Secondary | ICD-10-CM

## 2017-07-23 LAB — COMPREHENSIVE METABOLIC PANEL
ALBUMIN: 3.3 g/dL — AB (ref 3.5–5.0)
ALK PHOS: 82 U/L (ref 40–150)
ALT: 19 U/L (ref 0–55)
AST: 14 U/L (ref 5–34)
Anion Gap: 6 mEq/L (ref 3–11)
BUN: 6.3 mg/dL — AB (ref 7.0–26.0)
CO2: 24 mEq/L (ref 22–29)
Calcium: 9 mg/dL (ref 8.4–10.4)
Chloride: 108 mEq/L (ref 98–109)
Creatinine: 0.7 mg/dL (ref 0.6–1.1)
EGFR: >60 mL/min/{1.73_m2} (ref >60)
GLUCOSE: 124 mg/dL (ref 70–140)
Potassium: 4 mEq/L (ref 3.5–5.1)
SODIUM: 138 meq/L (ref 136–145)
TOTAL PROTEIN: 7.1 g/dL (ref 6.4–8.3)

## 2017-07-23 LAB — CBC WITH DIFFERENTIAL/PLATELET
BASO%: 0.4 % (ref 0.0–2.0)
Basophils Absolute: 0 10*3/uL (ref 0.0–0.1)
EOS%: 1.1 % (ref 0.0–7.0)
Eosinophils Absolute: 0.1 10*3/uL (ref 0.0–0.5)
HEMATOCRIT: 28.3 % — AB (ref 34.8–46.6)
HGB: 9.2 g/dL — ABNORMAL LOW (ref 11.6–15.9)
LYMPH#: 2.1 10*3/uL (ref 0.9–3.3)
LYMPH%: 34.8 % (ref 14.0–49.7)
MCH: 27.4 pg (ref 25.1–34.0)
MCHC: 32.6 g/dL (ref 31.5–36.0)
MCV: 83.9 fL (ref 79.5–101.0)
MONO#: 0.4 10*3/uL (ref 0.1–0.9)
MONO%: 6.1 % (ref 0.0–14.0)
NEUT%: 57.6 % (ref 38.4–76.8)
NEUTROS ABS: 3.6 10*3/uL (ref 1.5–6.5)
Platelets: 327 10*3/uL (ref 145–400)
RBC: 3.37 10*6/uL — AB (ref 3.70–5.45)
RDW: 17.1 % — ABNORMAL HIGH (ref 11.2–14.5)
WBC: 6.2 10*3/uL (ref 3.9–10.3)

## 2017-07-23 LAB — IRON AND TIBC
%SAT: 13 % — ABNORMAL LOW (ref 21–57)
IRON: 41 ug/dL (ref 41–142)
TIBC: 324 ug/dL (ref 236–444)
UIBC: 282 ug/dL (ref 120–384)

## 2017-07-23 LAB — FERRITIN: Ferritin: 19 ng/ml (ref 9–269)

## 2017-07-23 MED ORDER — SODIUM CHLORIDE 0.9% FLUSH
10.0000 mL | INTRAVENOUS | Status: DC | PRN
  Filled 2017-07-23: qty 10

## 2017-07-23 MED ORDER — SODIUM CHLORIDE 0.9 % IV SOLN
Freq: Once | INTRAVENOUS | Status: AC
  Administered 2017-07-23: 11:00:00 via INTRAVENOUS

## 2017-07-23 MED ORDER — DEXAMETHASONE SODIUM PHOSPHATE 10 MG/ML IJ SOLN
10.0000 mg | Freq: Once | INTRAMUSCULAR | Status: AC
  Administered 2017-07-23: 10 mg via INTRAVENOUS

## 2017-07-23 MED ORDER — SODIUM CHLORIDE 0.9 % IV SOLN
80.0000 mg/m2 | Freq: Once | INTRAVENOUS | Status: AC
  Administered 2017-07-23: 144 mg via INTRAVENOUS
  Filled 2017-07-23: qty 24

## 2017-07-23 MED ORDER — HEPARIN SOD (PORK) LOCK FLUSH 100 UNIT/ML IV SOLN
500.0000 [IU] | Freq: Once | INTRAVENOUS | Status: AC | PRN
  Administered 2017-07-23: 500 [IU]
  Filled 2017-07-23: qty 5

## 2017-07-23 MED ORDER — OMEPRAZOLE 40 MG PO CPDR: 40.0000 mg | DELAYED_RELEASE_CAPSULE | Freq: Every day | ORAL | 0 refills | Status: DC

## 2017-07-23 MED ORDER — HEPARIN SOD (PORK) LOCK FLUSH 100 UNIT/ML IV SOLN
500.0000 [IU] | Freq: Once | INTRAVENOUS | Status: DC
  Filled 2017-07-23: qty 5

## 2017-07-23 MED ORDER — DIPHENHYDRAMINE HCL 50 MG/ML IJ SOLN
25.0000 mg | Freq: Once | INTRAMUSCULAR | Status: AC
  Administered 2017-07-23: 25 mg via INTRAVENOUS

## 2017-07-23 MED ORDER — ALTEPLASE 2 MG IJ SOLR
2.0000 mg | Freq: Once | INTRAMUSCULAR | Status: DC | PRN
  Filled 2017-07-23: qty 2

## 2017-07-23 MED ORDER — SODIUM CHLORIDE 0.9% FLUSH
10.0000 mL | INTRAVENOUS | Status: DC | PRN
  Administered 2017-07-23: 10 mL
  Filled 2017-07-23: qty 10

## 2017-07-23 MED ORDER — FAMOTIDINE IN NACL 20-0.9 MG/50ML-% IV SOLN
20.0000 mg | Freq: Once | INTRAVENOUS | Status: AC
  Administered 2017-07-23: 20 mg via INTRAVENOUS

## 2017-07-23 MED ORDER — DIPHENHYDRAMINE HCL 50 MG/ML IJ SOLN
INTRAMUSCULAR | Status: AC
  Filled 2017-07-23: qty 1

## 2017-07-23 MED ORDER — ACETAMINOPHEN 325 MG PO TABS
ORAL_TABLET | ORAL | Status: AC
  Filled 2017-07-23: qty 2

## 2017-07-23 MED ORDER — ACETAMINOPHEN 325 MG PO TABS
650.0000 mg | ORAL_TABLET | Freq: Once | ORAL | Status: AC
  Administered 2017-07-23: 650 mg via ORAL

## 2017-07-23 MED ORDER — FAMOTIDINE IN NACL 20-0.9 MG/50ML-% IV SOLN
INTRAVENOUS | Status: AC
  Filled 2017-07-23: qty 50

## 2017-07-23 MED ORDER — TRASTUZUMAB CHEMO 150 MG IV SOLR
150.0000 mg | Freq: Once | INTRAVENOUS | Status: AC
  Administered 2017-07-23: 150 mg via INTRAVENOUS
  Filled 2017-07-23: qty 7.14

## 2017-07-23 MED ORDER — DEXAMETHASONE SODIUM PHOSPHATE 10 MG/ML IJ SOLN
INTRAMUSCULAR | Status: AC
  Filled 2017-07-23: qty 1

## 2017-07-23 NOTE — Progress Notes (Signed)
Erin [name]:    Erin Spruce, FNP Erin [name] [id number]   DIAGNOSIS: Cancer Staging Malignant neoplasm of lower-outer quadrant of left breast of female, estrogen receptor negative (Erin [name]) Staging form: Breast, AJCC 8th Edition - Clinical stage from 05/18/2017: Stage IA (cT1b, cN0, cM0, G3, ER: Negative, PR: Negative, HER2: Positive) - Unsigned - Pathologic: Stage IA (pT1c, pN0, cM0, G3, ER: Negative, PR: Negative, HER2: Positive) - Signed by [name], NP on 07/07/2017   SUMMARY OF ONCOLOGIC HISTORY: Oncology History   t      Malignant neoplasm of lower-outer quadrant of left breast of female, estrogen receptor negative (Erin [name])   05/11/2017 Initial Diagnosis    Left breast biopsy 3:30 position 6 cm from nipple: IDC with DCIS, lymphovascular invasion present, grade 2-3, ER 0%, PR 0%, HER-2 positive ratio 2.28, Ki-67 20%, 1 cm lesion left breast, T1b N0 stage IA clinical stage      06/09/2017 Surgery    Left lumpectomy: IDC grade 3, 1.2 cm, DCIS is present, margins negative, 0/6 lymph nodes negative, ER 0%, PR 0%, HER-2 positive ratio 2.28, Ki-67 20%, T1 BN 0 stage IA      07/09/2017 -  Adjuvant Chemotherapy    Taxol/Herceptin       CURRENT THERAPY: Taxol/Herceptin  INTERVAL HISTORY: [name] 56 y.o. female returns for evaluation prior to receiving Taxol and Herceptin.  A sign language interpreter is interpreting today.  She received treatment last week in addition to one unit of blood.  Her hemoglobin is improved today to 9.2.  She is taking oral iron as well.  She does have some esophageal burning, worse after eating, particularly after eating spicy foods.  She wants to know if anything will help this.    Patient Active Problem List   Diagnosis Date Noted  . Port-A-Cath in place 07/23/2017  . Malignant neoplasm of lower-outer quadrant of left breast of female, estrogen receptor negative (Erin [name]) 05/13/2017   . Abdominal bloating 04/02/2017  . Chronic right-sided low back pain without sciatica 07/02/2016  . DM type 2 (diabetes mellitus, type 2) (Erin [name]) 11/18/2014  . Intermittent constipation 06/06/2010  . UNSPECIFIED IRON DEFICIENCY ANEMIA 04/22/2010  . DEPRESSION 04/10/2010  . DEAFNESS 04/10/2010    has No Known Allergies.  MEDICAL HISTORY: Past Medical History:  Diagnosis Date  . Breast cancer (Erin [name]) 05/2017   left  . Cough 06/03/2017  . Deaf   . Dyspnea    states SOB with ADLs and at night - no home O2  . High cholesterol   . Non-insulin dependent type 2 diabetes mellitus (Erin [name])   . Runny nose 06/03/2017   clear drainage, per pt.    SURGICAL HISTORY: Past Surgical History:  Procedure Laterality Date  . TUBAL LIGATION  02/04/2002    SOCIAL HISTORY: Social History   Socioeconomic History  . Marital status: Legally Separated    Spouse name: Not on file  . Number of children: Not on file  . Years of education: Not on file  . Highest education level: Not on file  Social Needs  . Financial resource strain: Not on file  . Food insecurity - worry: Not on file  . Food insecurity - inability: Not on file  . Transportation needs - medical: Not on file  . Transportation needs - non-medical: Not on file  Occupational History  . Not on file  Tobacco Use  . Smoking status: Never Smoker  . Smokeless  tobacco: Never Used  Substance and Sexual Activity  . Alcohol use: No  . Drug use: No  . Sexual activity: No  Other Topics Concern  . Not on file  Social History Narrative  . Not on file    FAMILY HISTORY: Family History  Problem Relation Age of Onset  . Hypertension Mother   . Diabetes Father   . Hypertension Father   . Hypertension Sister   . Cancer Paternal Grandmother   . Diabetes Paternal Grandmother     Review of Systems  Constitutional: Negative for appetite change, chills, fatigue, fever and unexpected weight change.  HENT:   Negative for hearing loss,  lump/mass and sore throat.   Eyes: Negative for eye problems and icterus.  Respiratory: Negative for chest tightness, cough and shortness of breath.   Cardiovascular: Negative for chest pain, leg swelling and palpitations.  Gastrointestinal: Negative for abdominal distention, abdominal pain, blood in stool, constipation, diarrhea, nausea and vomiting.  Endocrine: Negative for hot flashes.  Genitourinary: Negative for difficulty urinating.   Musculoskeletal: Negative for arthralgias.  Skin: Negative for itching and rash.  Neurological: Negative for dizziness, extremity weakness, headaches and numbness.  Hematological: Negative for adenopathy. Does not bruise/bleed easily.  Psychiatric/Behavioral: Negative for depression. The patient is not nervous/anxious.       PHYSICAL EXAMINATION  ECOG PERFORMANCE STATUS: 1 - Symptomatic but completely ambulatory  Vitals:   07/23/17 1030  BP: 135/61  Pulse: 82  Resp: 18  Temp: 98 F (36.7 C)  SpO2: 100%    Physical Exam  Constitutional: She is oriented to person, place, and time and well-developed, well-nourished, and in no distress.  HENT:  Head: Normocephalic and atraumatic.  Mouth/Throat: Oropharynx is clear and moist. No oropharyngeal exudate.  Eyes: Pupils are equal, round, and reactive to light. No scleral icterus.  Neck: Neck supple.  Cardiovascular: Normal rate, regular rhythm and normal heart sounds.  Pulmonary/Chest: Effort normal and breath sounds normal. No respiratory distress. She has no wheezes. She has no rales. She exhibits no tenderness.  Abdominal: Soft. Bowel sounds are normal. She exhibits no distension and no mass. There is no tenderness. There is no rebound and no guarding.  Musculoskeletal: She exhibits no edema.  Lymphadenopathy:    She has no cervical adenopathy.  Neurological: She is alert and oriented to person, place, and time.  Skin: Skin is warm and dry. No rash noted. No erythema.  Psychiatric: Mood and  affect normal.    LABORATORY DATA:  CBC    Component Value Date/Time   WBC 6.2 07/23/2017 0946   WBC 10.3 10/12/2014 1257   RBC 3.37 (L) 07/23/2017 0946   RBC 4.07 10/12/2014 1257   HGB 9.2 (L) 07/23/2017 0946   HCT 28.3 (L) 07/23/2017 0946   PLT 327 07/23/2017 0946   PLT 375 05/26/2017 0918   MCV 83.9 07/23/2017 0946   MCH 27.4 07/23/2017 0946   MCH 26.5 10/12/2014 1257   MCHC 32.6 07/23/2017 0946   MCHC 31.9 10/12/2014 1257   RDW 17.1 (H) 07/23/2017 0946   LYMPHSABS 2.1 07/23/2017 0946   MONOABS 0.4 07/23/2017 0946   EOSABS 0.1 07/23/2017 0946   EOSABS 0.1 05/26/2017 0918   BASOSABS 0.0 07/23/2017 0946    CMP     Component Value Date/Time   NA 138 07/23/2017 0946   K 4.0 07/23/2017 0946   CL 103 04/02/2017 1208   CO2 24 07/23/2017 0946   GLUCOSE 124 07/23/2017 0946   BUN 6.3 (L) 07/23/2017  0946   CREATININE 0.7 07/23/2017 0946   CALCIUM 9.0 07/23/2017 0946   PROT 7.1 07/23/2017 0946   ALBUMIN 3.3 (L) 07/23/2017 0946   AST 14 07/23/2017 0946   ALT 19 07/23/2017 0946   ALKPHOS 82 07/23/2017 0946   BILITOT <0.22 07/23/2017 0946   GFRNONAA 104 04/02/2017 1208   GFRAA 120 04/02/2017 1208    ASSESSMENT and PLAN:   Malignant neoplasm of lower-outer quadrant of left breast of female, estrogen receptor negative ([place]) 06/09/2017: Left lumpectomy: IDC grade 3, 1.2 cm, DCIS is present, margins negative, 0/6 lymph nodes negative, ER 0%, PR 0%, HER-2 positive ratio 2.28, Ki-67 20%, T1 BN 0 stage IA  Pathology counseling: I discussed the final pathology report of the patient provided  a copy of this report. I discussed the margins as well as lymph node surgeries. We also discussed the final staging along with previously performed ER/PR and HER-2/neu testing.  Recommendation: 1. Adjuvant chemotherapy with Taxol Herceptin weekly 12 followed by Herceptin every 3 weeks for total of 6 months 2. Followed by adjuvant radiation  Echo on 05/24/17 demonstrates LVEF of  60-65%  ____________________________________________________________________  Erin [name] is doing well today.  She will proceed with weekly Taxol and Herceptin today.  I reviewed her labs which are stable.  I sent in Omeprazole for her indigestion/reflux.   She is requesting to change to Wednesday treatments.  She will f/u in one week for labs, Taxol/Herceptin, then will change to Wednesdays starting after the week of Thanksgiving.  Her family is in agreement with this plan.     All questions were answered. The patient knows to call the clinic with any problems, questions or concerns. We can certainly see the patient much sooner if necessary.  A total of (30) minutes of face-to-face time was spent with this patient with greater than 50% of that time in counseling and care-coordination.  This note was electronically signed. Scot Dock, NP 07/23/2017

## 2017-07-23 NOTE — Patient Instructions (Signed)
[facility] Discharge Instructions for Patients Receiving Chemotherapy Today you received the following chemotherapy agents:  Herceptin To help prevent nausea and vomiting after your treatment, we encourage you to take your nausea medication as prescribed.   If you develop nausea and vomiting that is not controlled by your nausea medication, call the clinic.   BELOW ARE SYMPTOMS THAT SHOULD BE REPORTED IMMEDIATELY:  *FEVER GREATER THAN 100.5 F  *CHILLS WITH OR WITHOUT FEVER  NAUSEA AND VOMITING THAT IS NOT CONTROLLED WITH YOUR NAUSEA MEDICATION  *UNUSUAL SHORTNESS OF BREATH  *UNUSUAL BRUISING OR BLEEDING  TENDERNESS IN MOUTH AND THROAT WITH OR WITHOUT PRESENCE OF ULCERS  *URINARY PROBLEMS  *BOWEL PROBLEMS  UNUSUAL RASH Items with * indicate a potential emergency and should be followed up as soon as possible.  Feel free to call the clinic should you have any questions or concerns. The clinic phone number is [phone].  Please show the CHEMO ALERT CARD at check-in to the Emergency Department and triage nurse.   

## 2017-07-23 NOTE — Assessment & Plan Note (Addendum)
06/09/2017: Left lumpectomy: IDC grade 3, 1.2 cm, DCIS is present, margins negative, 0/6 lymph nodes negative, ER 0%, PR 0%, HER-2 positive ratio 2.28, Ki-67 20%, T1 BN 0 stage IA  Pathology counseling: I discussed the final pathology report of the patient provided  a copy of this report. I discussed the margins as well as lymph node surgeries. We also discussed the final staging along with previously performed ER/PR and HER-2/neu testing.  Recommendation: 1. Adjuvant chemotherapy with Taxol Herceptin weekly 12 followed by Herceptin every 3 weeks for total of 6 months 2. Followed by adjuvant radiation  Echo on 05/24/17 demonstrates LVEF of 60-65%  ____________________________________________________________________  Erin [name] is doing well today.  She will proceed with weekly Taxol and Herceptin today.  I reviewed her labs which are stable.  I sent in Omeprazole for her indigestion/reflux.   She is requesting to change to Wednesday treatments.  She will f/u in one week for labs, Taxol/Herceptin, then will change to Wednesdays starting after the week of Thanksgiving.  Her family is in agreement with this plan.

## 2017-07-23 NOTE — Telephone Encounter (Signed)
Gave avs and calendar for November - January 2019 patients mother needed to change to wed

## 2017-07-23 NOTE — Telephone Encounter (Signed)
Per 11/2 - no los at check out °

## 2017-07-26 ENCOUNTER — Telehealth: Payer: Self-pay | Admitting: *Deleted

## 2017-07-26 ENCOUNTER — Encounter: Payer: Self-pay | Admitting: Family Medicine

## 2017-07-26 NOTE — Telephone Encounter (Signed)
Received [employer] message that patient left her charger at [facility] Friday for treatment.  Charger located in [address].  Interpreter message left for patient to pick up in the infusion room.

## 2017-07-30 ENCOUNTER — Telehealth: Payer: Self-pay | Admitting: Hematology and Oncology

## 2017-07-30 ENCOUNTER — Other Ambulatory Visit (HOSPITAL_BASED_OUTPATIENT_CLINIC_OR_DEPARTMENT_OTHER): Payer: Medicaid Other

## 2017-07-30 ENCOUNTER — Ambulatory Visit (HOSPITAL_BASED_OUTPATIENT_CLINIC_OR_DEPARTMENT_OTHER): Payer: Medicaid Other | Admitting: Hematology and Oncology

## 2017-07-30 ENCOUNTER — Ambulatory Visit: Payer: Medicare Other

## 2017-07-30 ENCOUNTER — Ambulatory Visit: Payer: Medicare Other | Admitting: Medical

## 2017-07-30 ENCOUNTER — Encounter: Payer: Self-pay | Admitting: *Deleted

## 2017-07-30 ENCOUNTER — Ambulatory Visit (HOSPITAL_BASED_OUTPATIENT_CLINIC_OR_DEPARTMENT_OTHER): Payer: Medicaid Other

## 2017-07-30 DIAGNOSIS — Z5112 Encounter for antineoplastic immunotherapy: Secondary | ICD-10-CM | POA: Diagnosis not present

## 2017-07-30 DIAGNOSIS — C50512 Malignant neoplasm of lower-outer quadrant of left female breast: Secondary | ICD-10-CM

## 2017-07-30 DIAGNOSIS — Z171 Estrogen receptor negative status [ER-]: Principal | ICD-10-CM

## 2017-07-30 DIAGNOSIS — Z5111 Encounter for antineoplastic chemotherapy: Secondary | ICD-10-CM | POA: Diagnosis not present

## 2017-07-30 DIAGNOSIS — Z95828 Presence of other vascular implants and grafts: Secondary | ICD-10-CM

## 2017-07-30 DIAGNOSIS — T451X5A Adverse effect of antineoplastic and immunosuppressive drugs, initial encounter: Secondary | ICD-10-CM

## 2017-07-30 LAB — CBC WITH DIFFERENTIAL/PLATELET
BASO%: 0.2 % (ref 0.0–2.0)
Basophils Absolute: 0 10*3/uL (ref 0.0–0.1)
EOS ABS: 0.1 10*3/uL (ref 0.0–0.5)
EOS%: 0.9 % (ref 0.0–7.0)
HEMATOCRIT: 27.8 % — AB (ref 34.8–46.6)
HEMOGLOBIN: 8.8 g/dL — AB (ref 11.6–15.9)
LYMPH#: 1.9 10*3/uL (ref 0.9–3.3)
LYMPH%: 29.7 % (ref 14.0–49.7)
MCH: 27.3 pg (ref 25.1–34.0)
MCHC: 31.7 g/dL (ref 31.5–36.0)
MCV: 86.3 fL (ref 79.5–101.0)
MONO#: 0.4 10*3/uL (ref 0.1–0.9)
MONO%: 5.7 % (ref 0.0–14.0)
NEUT%: 63.5 % (ref 38.4–76.8)
NEUTROS ABS: 4.1 10*3/uL (ref 1.5–6.5)
Platelets: 246 10*3/uL (ref 145–400)
RBC: 3.22 10*6/uL — ABNORMAL LOW (ref 3.70–5.45)
RDW: 16.7 % — AB (ref 11.2–14.5)
WBC: 6.4 10*3/uL (ref 3.9–10.3)

## 2017-07-30 LAB — COMPREHENSIVE METABOLIC PANEL
ALT: 20 U/L (ref 0–55)
ANION GAP: 8 meq/L (ref 3–11)
AST: 12 U/L (ref 5–34)
Albumin: 3.3 g/dL — ABNORMAL LOW (ref 3.5–5.0)
Alkaline Phosphatase: 82 U/L (ref 40–150)
BUN: 8.9 mg/dL (ref 7.0–26.0)
CALCIUM: 9 mg/dL (ref 8.4–10.4)
CHLORIDE: 106 meq/L (ref 98–109)
CO2: 23 meq/L (ref 22–29)
CREATININE: 0.7 mg/dL (ref 0.6–1.1)
Glucose: 126 mg/dl (ref 70–140)
Potassium: 3.8 mEq/L (ref 3.5–5.1)
Sodium: 137 mEq/L (ref 136–145)
Total Bilirubin: 0.27 mg/dL (ref 0.20–1.20)
Total Protein: 7.2 g/dL (ref 6.4–8.3)

## 2017-07-30 MED ORDER — SODIUM CHLORIDE 0.9 % IV SOLN
80.0000 mg/m2 | Freq: Once | INTRAVENOUS | Status: AC
  Administered 2017-07-30: 144 mg via INTRAVENOUS
  Filled 2017-07-30: qty 24

## 2017-07-30 MED ORDER — FAMOTIDINE IN NACL 20-0.9 MG/50ML-% IV SOLN
20.0000 mg | Freq: Once | INTRAVENOUS | Status: AC
  Administered 2017-07-30: 20 mg via INTRAVENOUS

## 2017-07-30 MED ORDER — METHYLPREDNISOLONE SODIUM SUCC 40 MG IJ SOLR: 40.0000 mg | Freq: Once | INTRAMUSCULAR | Status: DC

## 2017-07-30 MED ORDER — FAMOTIDINE IN NACL 20-0.9 MG/50ML-% IV SOLN
INTRAVENOUS | Status: AC
  Filled 2017-07-30: qty 50

## 2017-07-30 MED ORDER — HEPARIN SOD (PORK) LOCK FLUSH 100 UNIT/ML IV SOLN
500.0000 [IU] | Freq: Once | INTRAVENOUS | Status: AC | PRN
Start: 2017-07-30 — End: 2017-07-30
  Administered 2017-07-30: 500 [IU]
  Filled 2017-07-30: qty 5

## 2017-07-30 MED ORDER — DEXAMETHASONE SODIUM PHOSPHATE 10 MG/ML IJ SOLN
INTRAMUSCULAR | Status: AC
  Filled 2017-07-30: qty 1

## 2017-07-30 MED ORDER — METHYLPREDNISOLONE SODIUM SUCC 125 MG IJ SOLR
40.0000 mg | Freq: Once | INTRAMUSCULAR | Status: AC
  Administered 2017-07-30: 40 mg via INTRAVENOUS

## 2017-07-30 MED ORDER — DEXAMETHASONE SODIUM PHOSPHATE 10 MG/ML IJ SOLN
10.0000 mg | Freq: Once | INTRAMUSCULAR | Status: AC
  Administered 2017-07-30: 10 mg via INTRAVENOUS

## 2017-07-30 MED ORDER — DIPHENHYDRAMINE HCL 50 MG/ML IJ SOLN
25.0000 mg | Freq: Once | INTRAMUSCULAR | Status: AC
  Administered 2017-07-30: 25 mg via INTRAVENOUS

## 2017-07-30 MED ORDER — FAMOTIDINE IN NACL 20-0.9 MG/50ML-% IV SOLN
20.0000 mg | Freq: Once | INTRAVENOUS | Status: AC | PRN
  Administered 2017-07-30: 20 mg via INTRAVENOUS

## 2017-07-30 MED ORDER — SODIUM CHLORIDE 0.9% FLUSH
10.0000 mL | INTRAVENOUS | Status: DC | PRN
  Administered 2017-07-30: 10 mL
  Filled 2017-07-30: qty 10

## 2017-07-30 MED ORDER — DIPHENHYDRAMINE HCL 50 MG/ML IJ SOLN
25.0000 mg | Freq: Once | INTRAMUSCULAR | Status: AC | PRN
  Administered 2017-07-30: 25 mg via INTRAVENOUS

## 2017-07-30 MED ORDER — ACETAMINOPHEN 325 MG PO TABS
ORAL_TABLET | ORAL | Status: AC
  Filled 2017-07-30: qty 2

## 2017-07-30 MED ORDER — SODIUM CHLORIDE 0.9% FLUSH
10.0000 mL | INTRAVENOUS | Status: DC | PRN
  Administered 2017-07-30: 10 mL via INTRAVENOUS
  Filled 2017-07-30: qty 10

## 2017-07-30 MED ORDER — SODIUM CHLORIDE 0.9 % IV SOLN
Freq: Once | INTRAVENOUS | Status: AC
Start: 2017-07-30 — End: 2017-07-30
  Administered 2017-07-30: 11:00:00 via INTRAVENOUS

## 2017-07-30 MED ORDER — ACETAMINOPHEN 325 MG PO TABS
650.0000 mg | ORAL_TABLET | Freq: Once | ORAL | Status: AC
  Administered 2017-07-30: 650 mg via ORAL

## 2017-07-30 MED ORDER — TRASTUZUMAB CHEMO 150 MG IV SOLR
150.0000 mg | Freq: Once | INTRAVENOUS | Status: AC
  Administered 2017-07-30: 150 mg via INTRAVENOUS
  Filled 2017-07-30: qty 7.14

## 2017-07-30 MED ORDER — DIPHENHYDRAMINE HCL 50 MG/ML IJ SOLN
INTRAMUSCULAR | Status: AC
  Filled 2017-07-30: qty 1

## 2017-07-30 NOTE — Assessment & Plan Note (Signed)
06/09/2017: Left lumpectomy: IDC grade 3, 1.2 cm, DCIS is present, margins negative, 0/6 lymph nodes negative, ER 0%, PR 0%, HER-2 positive ratio 2.28, Ki-67 20%, T1 BN 0 stage IA  Treatment plan: 1. Adjuvant chemotherapy with Taxol Herceptin weekly 12 followed by Herceptin every 3 weeks for total of 6 months 2. Followed by adjuvant radiation  Echo on 05/24/17 demonstrates LVEF of 60-65%  ____________________________________________________________________  Current treatment: Cycle 4 Taxol Herceptin Chemo toxicities: 1.  GERD: On PPI therapy  Return to clinic weekly for Taxol and Herceptin and every 2 weeks for follow-up with me.

## 2017-07-30 NOTE — Patient Instructions (Addendum)
[facility] Discharge Instructions for Patients Receiving Chemotherapy      Aquaphor Lip Balm    Today you received the following chemotherapy agents Herceptin, Taxol.   To help prevent nausea and vomiting after your treatment, we encourage you to take your nausea medication as prescribed.   If you develop nausea and vomiting that is not controlled by your nausea medication, call the clinic.   BELOW ARE SYMPTOMS THAT SHOULD BE REPORTED IMMEDIATELY:  *FEVER GREATER THAN 100.5 F  *CHILLS WITH OR WITHOUT FEVER  NAUSEA AND VOMITING THAT IS NOT CONTROLLED WITH YOUR NAUSEA MEDICATION  *UNUSUAL SHORTNESS OF BREATH  *UNUSUAL BRUISING OR BLEEDING  TENDERNESS IN MOUTH AND THROAT WITH OR WITHOUT PRESENCE OF ULCERS  *URINARY PROBLEMS  *BOWEL PROBLEMS  UNUSUAL RASH Items with * indicate a potential emergency and should be followed up as soon as possible.  Feel free to call the clinic should you have any questions or concerns. The clinic phone number is [phone].  Please show the [place] at check-in to the Emergency Department and triage nurse.

## 2017-07-30 NOTE — Progress Notes (Signed)
1350 - Patient stated through interpreter that she "feels warm" in her face and chest. Taxol paused and Vital signs assessed. VSS. Sandi Mealy, PA notified. Additional medications given per [facility]. Per patient she has never had a previous reaction to either medication before. Patient states that her symptoms resolved completely. Taxol restarted after 15-minute observation per Sandi Mealy.

## 2017-07-30 NOTE — Progress Notes (Signed)
Patient finished Taxol without any further incident. Discharged in stable condition with no complaints via patient.   Wylene Simmer, BSN, RN 07/30/2017 3:31 PM

## 2017-07-30 NOTE — Telephone Encounter (Signed)
Per 11/16 - no los at check out.

## 2017-07-30 NOTE — Progress Notes (Signed)
Patient Care Team: [name], FNP as PCP - General (Family Medicine) Excell Seltzer, MD as Consulting Physician (General Surgery) [name], MD as Consulting Physician (Hematology and Oncology) Kyung Rudd, MD as Consulting Physician (Radiation Oncology)  DIAGNOSIS:  Encounter Diagnosis  Name Primary?  . Malignant neoplasm of lower-outer quadrant of left breast of female, estrogen receptor negative ([place])     SUMMARY OF ONCOLOGIC HISTORY:   Malignant neoplasm of lower-outer quadrant of left breast of female, estrogen receptor negative ([place])   05/11/2017 Initial Diagnosis    Left breast biopsy 3:30 position 6 cm from nipple: IDC with DCIS, lymphovascular invasion present, grade 2-3, ER 0%, PR 0%, HER-2 positive ratio 2.28, Ki-67 20%, 1 cm lesion left breast, T1b N0 stage IA clinical stage      06/09/2017 Surgery    Left lumpectomy: IDC grade 3, 1.2 cm, DCIS is present, margins negative, 0/6 lymph nodes negative, ER 0%, PR 0%, HER-2 positive ratio 2.28, Ki-67 20%, T1 BN 0 stage IA      07/09/2017 -  Adjuvant Chemotherapy    Taxol/Herceptin       CHIEF COMPLIANT: Cycle 4 Taxol Herceptin  INTERVAL HISTORY: Erin [name] is a 56 year old with above-mentioned history left breast cancer currently on adjuvant chemotherapy with Taxol and Herceptin.  Today cycle 4 of treatment.  She has been tolerating extremely well.  She does not have any nausea vomiting.  Denies any neuropathy.  REVIEW OF SYSTEMS:   Constitutional: Denies fevers, chills or abnormal weight loss Eyes: Denies blurriness of vision Ears, nose, mouth, throat, and face: Denies mucositis or sore throat Respiratory: Denies cough, dyspnea or wheezes Cardiovascular: Denies palpitation, chest discomfort Gastrointestinal:  Denies nausea, heartburn or change in bowel habits Skin: Denies abnormal skin rashes Lymphatics: Denies new lymphadenopathy or easy bruising Neurological:Denies numbness, tingling or new  weaknesses Behavioral/Psych: Mood is stable, no new changes  Extremities: No lower extremity edema Breast:  denies any pain or lumps or nodules in either breasts All other systems were reviewed with the patient and are negative.  I have reviewed the past medical history, past surgical history, social history and family history with the patient and they are unchanged from previous note.  ALLERGIES:  has No Known Allergies.  MEDICATIONS:  Current Outpatient Medications  Medication Sig Dispense Refill  . atorvastatin (LIPITOR) 40 MG tablet Take 1 tablet (40 mg total) by mouth daily. 90 tablet 3  . Blood Glucose Monitoring Suppl (ACCU-CHEK AVIVA PLUS) W/DEVICE KIT Check Blood sugar AM fasting and before dinner 1 kit 0  . cholecalciferol (VITAMIN D) 1000 units tablet Take 1,000 Units by mouth daily.    . ciclopirox (PENLAC) 8 % solution Apply topically at bedtime. Apply over nail and surrounding skin. Apply daily over previous coat. After seven (7) days, may remove with alcohol and continue cycle. 6.6 mL 1  . Elastic Bandages & Supports (LUMBAR BACK BRACE/SUPPORT PAD) MISC 1 each by Does not apply route daily. 1 each 0  . ferrous sulfate 325 (65 FE) MG tablet Take 1 tablet (325 mg total) by mouth daily with breakfast. 30 tablet 3  . glucose blood (ACCU-CHEK AVIVA) test strip Use as instructed 100 each 12  . Lancet Devices (ACCU-CHEK SOFTCLIX) lancets Use as instructed 1 each 0  . lidocaine-prilocaine (EMLA) cream Apply to affected area once 30 g 3  . lisinopril (PRINIVIL,ZESTRIL) 2.5 MG tablet Take 1 tablet (2.5 mg total) by mouth daily. 30 tablet 2  . metFORMIN (GLUCOPHAGE XR)  500 MG 24 hr tablet Take 2 tablets (1,000 mg total) by mouth daily with breakfast. 60 tablet 11  . Omega-3 Fatty Acids (FISH OIL OMEGA-3 PO) Take by mouth.    Marland Kitchen omeprazole (PRILOSEC) 40 MG capsule Take 1 capsule (40 mg total) daily by mouth. 30 capsule 0  . ondansetron (ZOFRAN) 8 MG tablet Take 1 tablet (8 mg total) by  mouth 2 (two) times daily as needed (Nausea or vomiting). 30 tablet 1  . oxyCODONE (OXY IR/ROXICODONE) 5 MG immediate release tablet Take 1 tablet (5 mg total) by mouth every 6 (six) hours as needed for severe pain. 10 tablet 0  . prochlorperazine (COMPAZINE) 10 MG tablet Take 1 tablet (10 mg total) by mouth every 6 (six) hours as needed (Nausea or vomiting). 30 tablet 1  . TRUEPLUS LANCETS 28G MISC USE AS DIRECTED BY PHYSICIAN 100 each 0   No current facility-administered medications for this visit.     PHYSICAL EXAMINATION: ECOG PERFORMANCE STATUS: 1 - Symptomatic but completely ambulatory  Vitals:   07/30/17 0929  BP: 131/67  Pulse: 88  Resp: 18  Temp: 98 F (36.7 C)  SpO2: 100%   Filed Weights   07/30/17 0929  Weight: 163 lb 12.8 oz (74.3 kg)    GENERAL:alert, no distress and comfortable SKIN: skin color, texture, turgor are normal, no rashes or significant lesions EYES: normal, Conjunctiva are pink and non-injected, sclera clear OROPHARYNX:no exudate, no erythema and lips, buccal mucosa, and tongue normal  NECK: supple, thyroid normal size, non-tender, without nodularity LYMPH:  no palpable lymphadenopathy in the cervical, axillary or inguinal LUNGS: clear to auscultation and percussion with normal breathing effort HEART: regular rate & rhythm and no murmurs and no lower extremity edema ABDOMEN:abdomen soft, non-tender and normal bowel sounds MUSCULOSKELETAL:no cyanosis of digits and no clubbing  NEURO: alert & oriented x 3 with fluent speech, no focal motor/sensory deficits EXTREMITIES: No lower extremity edema BREAST: No palpable masses or nodules in either right or left breasts. No palpable axillary supraclavicular or infraclavicular adenopathy no breast tenderness or nipple discharge. (exam performed in the presence of a chaperone)  LABORATORY DATA:  I have reviewed the data as listed   Chemistry      Component Value Date/Time   NA 138 07/23/2017 0946   K 4.0  07/23/2017 0946   CL 103 04/02/2017 1208   CO2 24 07/23/2017 0946   BUN 6.3 (L) 07/23/2017 0946   CREATININE 0.7 07/23/2017 0946      Component Value Date/Time   CALCIUM 9.0 07/23/2017 0946   ALKPHOS 82 07/23/2017 0946   AST 14 07/23/2017 0946   ALT 19 07/23/2017 0946   BILITOT <0.22 07/23/2017 0946       Lab Results  Component Value Date   WBC 6.4 07/30/2017   HGB 8.8 (L) 07/30/2017   HCT 27.8 (L) 07/30/2017   MCV 86.3 07/30/2017   PLT 246 07/30/2017   NEUTROABS 4.1 07/30/2017    ASSESSMENT & PLAN:  Malignant neoplasm of lower-outer quadrant of left breast of female, estrogen receptor negative ([place]) 06/09/2017: Left lumpectomy: IDC grade 3, 1.2 cm, DCIS is present, margins negative, 0/6 lymph nodes negative, ER 0%, PR 0%, HER-2 positive ratio 2.28, Ki-67 20%, T1 BN 0 stage IA  Treatment plan: 1. Adjuvant chemotherapy with Taxol Herceptin weekly 12 followed by Herceptin every 3 weeks for total of 6 months 2. Followed by adjuvant radiation  Echo on 05/24/17 demonstrates LVEF of 60-65%  ____________________________________________________________________  Current treatment: Cycle  4 Taxol Herceptin Chemo toxicities: 1.  GERD: On PPI therapy 2. hair thinning  Return to clinic weekly for Taxol and Herceptin and every 2 weeks for follow-up with me.    I spent 25 minutes talking to the patient of which more than half was spent in counseling and coordination of care.  No orders of the defined types were placed in this encounter.  The patient has a good understanding of the overall plan. she agrees with it. she will call with any problems that may develop before the next visit here.   Rulon Eisenmenger, MD 07/30/17

## 2017-08-02 NOTE — Progress Notes (Signed)
[facility] Progress Note   Erin [name] [id number] February 27, 1961 56 y.o.  Erin [name] is managed by [name]   Actively treated with chemotherapy: yes  Current Therapy:  Taxol and Herceptin  Last Treated: 07/30/2017  Assessment: Plan:    Chemotherapy adverse reaction, initial encounter  Erin [name] was seen in the infusion room for a suspected chemotherapy reaction. She was receiving Taxol at the time of her reaction. Her symptoms included: Warmth in her chest She was premedicated with Benadryl, Tylenol, Decadron, and Pepcid prior to starting chemotherapy. Taxol was paused and Erin [name] was given Benadryl 25 mg IV x1 and Tylenol 650 mg p.o. x1 prior to being seen by this provider.   She was then given Solu-Medrol 40 mg IV and Pepcid 20 mg IV Erin [name] did  respond to intervention.  The sensation of warmth within her chest resolved.  The patient was able to receive the remainder of her chemotherapy with out any additional symptoms.  Please see After Visit Summary for patient specific instructions.  Future Appointments  Date Time Provider [place]  08/11/2017 10:30 AM [facility] LAB 4 [facility] None  08/11/2017 10:45 AM [facility] INJ NURSE [facility] None  08/11/2017  1:00 PM [facility] B7 [facility] None  08/17/2017 11:00 AM [facility] ECHO 1-BU[facility]  08/17/2017 12:00 PM Bensimhon, Shaune Pascal, MD [facility] None  08/18/2017  8:45 AM [facility] LAB 6 [facility] None  08/18/2017  9:00 AM [facility] INJ NURSE [facility] None  08/18/2017  9:30 AM [name], NP [facility] None  08/18/2017  2:00 PM [facility] D12 [facility] None  08/23/2017  8:40 AM Anyanwu, Sallyanne Havers, MD [facility]  08/25/2017  8:45 AM [facility] LAB 2 [facility] None  08/25/2017  9:00 AM [facility] FLUSH NURSE 2 [facility] None  08/25/2017 10:00 AM [facility] F20 [facility] None  09/01/2017  8:00 AM [facility] LAB 6 [facility]  None  09/01/2017  8:15 AM [facility] FLUSH NURSE [facility] None  09/01/2017  9:30 AM [name], MD [facility] None  09/01/2017 10:30 AM [facility] I25 DNS [facility] None  09/08/2017  8:15 AM [facility] LAB 5 [facility] None  09/08/2017  8:30 AM [facility] INJ NURSE [facility] None  09/08/2017  9:30 AM [facility] D12 [facility] None  09/15/2017  7:45 AM [facility] LAB 4 [facility] None  09/15/2017  8:00 AM [facility] FLUSH NURSE [facility] None  09/15/2017  8:30 AM Causey, Charlestine Massed, NP [facility] None  09/15/2017  9:30 AM [facility] I25 DNS [facility] None  09/23/2017  9:30 AM Hairston, Maylon Peppers, FNP [facility] None    No orders of the defined types were placed in this encounter.      Subjective:   Patient ID:  Erin [name] is a 57 y.o. (DOB 1960-12-06) female.  Chief Complaint: No chief complaint on file.   HPI Erin [name] was seen in the infusion room for a suspected reaction to chemotherapy. She was receiving Taxol at the time of her reaction. Her symptoms included: Warmth in her chest. She was premedicated with Benadryl, Tylenol, Decadron, and Pepcid prior to starting chemotherapy. Taxol was paused and Erin [name] was given Benadryl 25 mg IV x1 and Tylenol 650 mg p.o. x1 prior to being seen by this provider.   She was then given Solu-Medrol 40 mg IV and Pepcid 20 mg IV. Erin [name] did  respond to intervention.  The sensation of warmth within her chest resolved.  The patient was able to receive the remainder  of her chemotherapy with out any additional symptoms.  Medications: I have reviewed the patient's current medications.  Allergies: No Known Allergies  Past Medical History:  Diagnosis Date  . Breast cancer ([place]) 05/2017   left  . Cough 06/03/2017  . Deaf   . Dyspnea    states SOB with ADLs and at night - no home O2  . High cholesterol   . Non-insulin dependent type 2 diabetes mellitus ([place])   . Runny nose 06/03/2017   clear  drainage, per pt.    Past Surgical History:  Procedure Laterality Date  . BREAST LUMPECTOMY WITH RADIOACTIVE SEED AND SENTINEL LYMPH NODE BIOPSY Left 06/09/2017   Performed by Excell Seltzer, MD at [facility]  . INSERTION PORT-A-CATH WITH [place] Right 06/09/2017   Performed by Excell Seltzer, MD at [facility]  . TUBAL LIGATION  02/04/2002    Family History  Problem Relation Age of Onset  . Hypertension Mother   . Diabetes Father   . Hypertension Father   . Hypertension Sister   . Cancer Paternal Grandmother   . Diabetes Paternal Grandmother     Social History   Socioeconomic History  . Marital status: Legally Separated    Spouse name: Not on file  . Number of children: Not on file  . Years of education: Not on file  . Highest education level: Not on file  Social Needs  . Financial resource strain: Not on file  . Food insecurity - worry: Not on file  . Food insecurity - inability: Not on file  . Transportation needs - medical: Not on file  . Transportation needs - non-medical: Not on file  Occupational History  . Not on file  Tobacco Use  . Smoking status: Never Smoker  . Smokeless tobacco: Never Used  Substance and Sexual Activity  . Alcohol use: No  . Drug use: No  . Sexual activity: No  Other Topics Concern  . Not on file  Social History Narrative  . Not on file    Past Medical History, Surgical history, Social history, and Family history were reviewed and updated as appropriate.   Please see review of systems for further details on the patient's review from today.   Review of Systems:  Review of Systems  Constitutional: Negative for chills and diaphoresis.  HENT: Negative for trouble swallowing.   Respiratory: Negative for cough, choking, chest tightness and shortness of breath.        Sensation of warmth within the chest.  Cardiovascular: Negative for chest pain and palpitations.    Objective:   Physical Exam:    There were no vitals taken for this visit. ECOG: 0  Physical Exam  Constitutional: No distress.  HENT:  Head: Normocephalic and atraumatic.  Eyes: Right eye exhibits no discharge. Left eye exhibits no discharge. No scleral icterus.  Cardiovascular: Normal rate, regular rhythm and normal heart sounds. Exam reveals no gallop and no friction rub.  No murmur heard. Pulmonary/Chest: Effort normal and breath sounds normal. No respiratory distress. She has no wheezes. She has no rales.  Musculoskeletal: She exhibits no edema.  Neurological: She is alert.  Skin: Skin is warm and dry. No rash noted. She is not diaphoretic. No erythema.    Lab Review:     Component Value Date/Time   NA 137 07/30/2017 0907   K 3.8 07/30/2017 0907   CL 103 04/02/2017 1208   CO2 23 07/30/2017 0907   GLUCOSE 126 07/30/2017 0907  BUN 8.9 07/30/2017 0907   CREATININE 0.7 07/30/2017 0907   CALCIUM 9.0 07/30/2017 0907   PROT 7.2 07/30/2017 0907   ALBUMIN 3.3 (L) 07/30/2017 0907   AST 12 07/30/2017 0907   ALT 20 07/30/2017 0907   ALKPHOS 82 07/30/2017 0907   BILITOT 0.27 07/30/2017 0907   GFRNONAA 104 04/02/2017 1208   GFRAA 120 04/02/2017 1208       Component Value Date/Time   WBC 6.4 07/30/2017 0907   WBC 10.3 10/12/2014 1257   RBC 3.22 (L) 07/30/2017 0907   RBC 4.07 10/12/2014 1257   HGB 8.8 (L) 07/30/2017 0907   HCT 27.8 (L) 07/30/2017 0907   PLT 246 07/30/2017 0907   PLT 375 05/26/2017 0918   MCV 86.3 07/30/2017 0907   MCH 27.3 07/30/2017 0907   MCH 26.5 10/12/2014 1257   MCHC 31.7 07/30/2017 0907   MCHC 31.9 10/12/2014 1257   RDW 16.7 (H) 07/30/2017 0907   LYMPHSABS 1.9 07/30/2017 0907   MONOABS 0.4 07/30/2017 0907   EOSABS 0.1 07/30/2017 0907   EOSABS 0.1 05/26/2017 0918   BASOSABS 0.0 07/30/2017 0907   -------------------------------  Imaging from last 24 hours (if applicable):  Radiology interpretation: No results found.

## 2017-08-10 ENCOUNTER — Telehealth (HOSPITAL_COMMUNITY): Payer: Self-pay | Admitting: Vascular Surgery

## 2017-08-10 NOTE — Telephone Encounter (Signed)
Left pt message to keep appt date and time 08/17/17

## 2017-08-11 ENCOUNTER — Other Ambulatory Visit (HOSPITAL_BASED_OUTPATIENT_CLINIC_OR_DEPARTMENT_OTHER): Payer: Medicaid Other

## 2017-08-11 ENCOUNTER — Ambulatory Visit (HOSPITAL_BASED_OUTPATIENT_CLINIC_OR_DEPARTMENT_OTHER): Payer: Medicaid Other

## 2017-08-11 ENCOUNTER — Ambulatory Visit: Payer: Medicare Other

## 2017-08-11 VITALS — BP 140/65 | HR 91 | Temp 98.2°F | Resp 18

## 2017-08-11 DIAGNOSIS — Z5112 Encounter for antineoplastic immunotherapy: Secondary | ICD-10-CM

## 2017-08-11 DIAGNOSIS — Z5111 Encounter for antineoplastic chemotherapy: Secondary | ICD-10-CM

## 2017-08-11 DIAGNOSIS — Z171 Estrogen receptor negative status [ER-]: Principal | ICD-10-CM

## 2017-08-11 DIAGNOSIS — C50512 Malignant neoplasm of lower-outer quadrant of left female breast: Secondary | ICD-10-CM | POA: Diagnosis not present

## 2017-08-11 DIAGNOSIS — Z95828 Presence of other vascular implants and grafts: Secondary | ICD-10-CM

## 2017-08-11 LAB — CBC WITH DIFFERENTIAL/PLATELET
BASO%: 0.2 % (ref 0.0–2.0)
BASOS ABS: 0 10*3/uL (ref 0.0–0.1)
EOS%: 1.1 % (ref 0.0–7.0)
Eosinophils Absolute: 0.1 10*3/uL (ref 0.0–0.5)
HEMATOCRIT: 27.3 % — AB (ref 34.8–46.6)
HGB: 8.8 g/dL — ABNORMAL LOW (ref 11.6–15.9)
LYMPH#: 2.2 10*3/uL (ref 0.9–3.3)
LYMPH%: 23.8 % (ref 14.0–49.7)
MCH: 27.2 pg (ref 25.1–34.0)
MCHC: 32.3 g/dL (ref 31.5–36.0)
MCV: 84 fL (ref 79.5–101.0)
MONO#: 0.9 10*3/uL (ref 0.1–0.9)
MONO%: 9.3 % (ref 0.0–14.0)
NEUT#: 6.1 10*3/uL (ref 1.5–6.5)
NEUT%: 65.6 % (ref 38.4–76.8)
Platelets: 370 10*3/uL (ref 145–400)
RBC: 3.25 10*6/uL — ABNORMAL LOW (ref 3.70–5.45)
RDW: 18.3 % — ABNORMAL HIGH (ref 11.2–14.5)
WBC: 9.3 10*3/uL (ref 3.9–10.3)

## 2017-08-11 LAB — COMPREHENSIVE METABOLIC PANEL
ALT: 17 U/L (ref 0–55)
AST: 15 U/L (ref 5–34)
Albumin: 3.3 g/dL — ABNORMAL LOW (ref 3.5–5.0)
Alkaline Phosphatase: 86 U/L (ref 40–150)
Anion Gap: 7 mEq/L (ref 3–11)
BUN: 6.8 mg/dL — AB (ref 7.0–26.0)
CO2: 24 meq/L (ref 22–29)
CREATININE: 0.8 mg/dL (ref 0.6–1.1)
Calcium: 9.2 mg/dL (ref 8.4–10.4)
Chloride: 106 mEq/L (ref 98–109)
EGFR: >60 mL/min/{1.73_m2} (ref >60)
GLUCOSE: 195 mg/dL — AB (ref 70–140)
Potassium: 3.9 mEq/L (ref 3.5–5.1)
SODIUM: 138 meq/L (ref 136–145)
Total Bilirubin: <0.22 mg/dL (ref 0.20–1.20)
Total Protein: 7.3 g/dL (ref 6.4–8.3)

## 2017-08-11 MED ORDER — DEXAMETHASONE SODIUM PHOSPHATE 10 MG/ML IJ SOLN
10.0000 mg | Freq: Once | INTRAMUSCULAR | Status: AC
  Administered 2017-08-11: 10 mg via INTRAVENOUS

## 2017-08-11 MED ORDER — DIPHENHYDRAMINE HCL 50 MG/ML IJ SOLN
25.0000 mg | Freq: Once | INTRAMUSCULAR | Status: AC
  Administered 2017-08-11: 25 mg via INTRAVENOUS

## 2017-08-11 MED ORDER — DIPHENHYDRAMINE HCL 50 MG/ML IJ SOLN
INTRAMUSCULAR | Status: AC
  Filled 2017-08-11: qty 1

## 2017-08-11 MED ORDER — ACETAMINOPHEN 325 MG PO TABS
650.0000 mg | ORAL_TABLET | Freq: Once | ORAL | Status: AC
  Administered 2017-08-11: 650 mg via ORAL

## 2017-08-11 MED ORDER — PACLITAXEL CHEMO INJECTION 300 MG/50ML
80.0000 mg/m2 | Freq: Once | INTRAVENOUS | Status: AC
  Administered 2017-08-11: 144 mg via INTRAVENOUS
  Filled 2017-08-11: qty 24

## 2017-08-11 MED ORDER — FAMOTIDINE IN NACL 20-0.9 MG/50ML-% IV SOLN
20.0000 mg | Freq: Once | INTRAVENOUS | Status: AC
  Administered 2017-08-11: 20 mg via INTRAVENOUS

## 2017-08-11 MED ORDER — SODIUM CHLORIDE 0.9% FLUSH
10.0000 mL | INTRAVENOUS | Status: DC | PRN
  Administered 2017-08-11: 10 mL via INTRAVENOUS
  Filled 2017-08-11: qty 10

## 2017-08-11 MED ORDER — ACETAMINOPHEN 325 MG PO TABS
ORAL_TABLET | ORAL | Status: AC
  Filled 2017-08-11: qty 2

## 2017-08-11 MED ORDER — HEPARIN SOD (PORK) LOCK FLUSH 100 UNIT/ML IV SOLN
500.0000 [IU] | Freq: Once | INTRAVENOUS | Status: AC | PRN
  Administered 2017-08-11: 500 [IU]
  Filled 2017-08-11: qty 5

## 2017-08-11 MED ORDER — SODIUM CHLORIDE 0.9% FLUSH
10.0000 mL | INTRAVENOUS | Status: DC | PRN
  Administered 2017-08-11: 10 mL
  Filled 2017-08-11: qty 10

## 2017-08-11 MED ORDER — SODIUM CHLORIDE 0.9 % IV SOLN
Freq: Once | INTRAVENOUS | Status: AC
  Administered 2017-08-11: 12:00:00 via INTRAVENOUS

## 2017-08-11 MED ORDER — TRASTUZUMAB CHEMO 150 MG IV SOLR
150.0000 mg | Freq: Once | INTRAVENOUS | Status: AC
  Administered 2017-08-11: 150 mg via INTRAVENOUS
  Filled 2017-08-11: qty 7.14

## 2017-08-11 MED ORDER — FAMOTIDINE IN NACL 20-0.9 MG/50ML-% IV SOLN
INTRAVENOUS | Status: AC
  Filled 2017-08-11: qty 50

## 2017-08-11 MED ORDER — DEXAMETHASONE SODIUM PHOSPHATE 10 MG/ML IJ SOLN
INTRAMUSCULAR | Status: AC
  Filled 2017-08-11: qty 1

## 2017-08-11 NOTE — Patient Instructions (Signed)
[facility] Discharge Instructions for Patients Receiving Chemotherapy  Today you received the following chemotherapy agents Herceptin and Taxol  To help prevent nausea and vomiting after your treatment, we encourage you to take your nausea medication as directed If you develop nausea and vomiting that is not controlled by your nausea medication, call the clinic.   BELOW ARE SYMPTOMS THAT SHOULD BE REPORTED IMMEDIATELY:  *FEVER GREATER THAN 100.5 F  *CHILLS WITH OR WITHOUT FEVER  NAUSEA AND VOMITING THAT IS NOT CONTROLLED WITH YOUR NAUSEA MEDICATION  *UNUSUAL SHORTNESS OF BREATH  *UNUSUAL BRUISING OR BLEEDING  TENDERNESS IN MOUTH AND THROAT WITH OR WITHOUT PRESENCE OF ULCERS  *URINARY PROBLEMS  *BOWEL PROBLEMS  UNUSUAL RASH Items with * indicate a potential emergency and should be followed up as soon as possible.  Feel free to call the clinic should you have any questions or concerns. The clinic phone number is [phone].  Please show the [facility] at check-in to the Emergency Department and triage nurse.  Trastuzumab injection for infusion (Herceptin) What is this medicine? TRASTUZUMAB (tras TOO zoo mab) is a monoclonal antibody. It is used to treat breast cancer and stomach cancer. This medicine may be used for other purposes; ask your health care provider or pharmacist if you have questions. COMMON BRAND NAME(S): Herceptin What should I tell my health care provider before I take this medicine? They need to know if you have any of these conditions: -heart disease -heart failure -lung or breathing disease, like asthma -an unusual or allergic reaction to trastuzumab, benzyl alcohol, or other medications, foods, dyes, or preservatives -pregnant or trying to get pregnant -breast-feeding How should I use this medicine? This drug is given as an infusion into a vein. It is administered in a hospital or clinic by a specially trained health care  professional. Talk to your pediatrician regarding the use of this medicine in children. This medicine is not approved for use in children. Overdosage: If you think you have taken too much of this medicine contact a poison control center or emergency room at once. NOTE: This medicine is only for you. Do not share this medicine with others. What if I miss a dose? It is important not to miss a dose. Call your doctor or health care professional if you are unable to keep an appointment. What may interact with this medicine? This medicine may interact with the following medications: -certain types of chemotherapy, such as daunorubicin, doxorubicin, epirubicin, and idarubicin This list may not describe all possible interactions. Give your health care provider a list of all the medicines, herbs, non-prescription drugs, or dietary supplements you use. Also tell them if you smoke, drink alcohol, or use illegal drugs. Some items may interact with your medicine. What should I watch for while using this medicine? Visit your doctor for checks on your progress. Report any side effects. Continue your course of treatment even though you feel ill unless your doctor tells you to stop. Call your doctor or health care professional for advice if you get a fever, chills or sore throat, or other symptoms of a cold or flu. Do not treat yourself. Try to avoid being around people who are sick. You may experience fever, chills and shaking during your first infusion. These effects are usually mild and can be treated with other medicines. Report any side effects during the infusion to your health care professional. Fever and chills usually do not happen with later infusions. Do not become pregnant  while taking this medicine or for 7 months after stopping it. Women should inform their doctor if they wish to become pregnant or think they might be pregnant. Women of child-bearing potential will need to have a negative pregnancy test  before starting this medicine. There is a potential for serious side effects to an unborn child. Talk to your health care professional or pharmacist for more information. Do not breast-feed an infant while taking this medicine or for 7 months after stopping it. Women must use effective birth control with this medicine. What side effects may I notice from receiving this medicine? Side effects that you should report to your doctor or health care professional as soon as possible: -allergic reactions like skin rash, itching or hives, swelling of the face, lips, or tongue -chest pain or palpitations -cough -dizziness -feeling faint or lightheaded, falls -fever -general ill feeling or flu-like symptoms -signs of worsening heart failure like breathing problems; swelling in your legs and feet -unusually weak or tired Side effects that usually do not require medical attention (report to your doctor or health care professional if they continue or are bothersome): -bone pain -changes in taste -diarrhea -joint pain -nausea/vomiting -weight loss This list may not describe all possible side effects. Call your doctor for medical advice about side effects. You may report side effects to FDA at 1-800-FDA-1088. Where should I keep my medicine? This drug is given in a hospital or clinic and will not be stored at home. NOTE: This sheet is a summary. It may not cover all possible information. If you have questions about this medicine, talk to your doctor, pharmacist, or health care provider.  2018 Elsevier/Gold Standard (2016-08-25 14:37:52)  Paclitaxel injection (Taxol) What is this medicine? PACLITAXEL (PAK li TAX el) is a chemotherapy drug. It targets fast dividing cells, like cancer cells, and causes these cells to die. This medicine is used to treat ovarian cancer, breast cancer, and other cancers. This medicine may be used for other purposes; ask your health care provider or pharmacist if you have  questions. COMMON BRAND NAME(S): Onxol, Taxol What should I tell my health care provider before I take this medicine? They need to know if you have any of these conditions: -blood disorders -irregular heartbeat -infection (especially a virus infection such as chickenpox, cold sores, or herpes) -liver disease -previous or ongoing radiation therapy -an unusual or allergic reaction to paclitaxel, alcohol, polyoxyethylated castor oil, other chemotherapy agents, other medicines, foods, dyes, or preservatives -pregnant or trying to get pregnant -breast-feeding How should I use this medicine? This drug is given as an infusion into a vein. It is administered in a hospital or clinic by a specially trained health care professional. Talk to your pediatrician regarding the use of this medicine in children. Special care may be needed. Overdosage: If you think you have taken too much of this medicine contact a poison control center or emergency room at once. NOTE: This medicine is only for you. Do not share this medicine with others. What if I miss a dose? It is important not to miss your dose. Call your doctor or health care professional if you are unable to keep an appointment. What may interact with this medicine? Do not take this medicine with any of the following medications: -disulfiram -metronidazole This medicine may also interact with the following medications: -cyclosporine -diazepam -ketoconazole -medicines to increase blood counts like filgrastim, pegfilgrastim, sargramostim -other chemotherapy drugs like cisplatin, doxorubicin, epirubicin, etoposide, teniposide, vincristine -quinidine -testosterone -vaccines -verapamil Talk  to your doctor or health care professional before taking any of these medicines: -acetaminophen -aspirin -ibuprofen -ketoprofen -naproxen This list may not describe all possible interactions. Give your health care provider a list of all the medicines, herbs,  non-prescription drugs, or dietary supplements you use. Also tell them if you smoke, drink alcohol, or use illegal drugs. Some items may interact with your medicine. What should I watch for while using this medicine? Your condition will be monitored carefully while you are receiving this medicine. You will need important blood work done while you are taking this medicine. This medicine can cause serious allergic reactions. To reduce your risk you will need to take other medicine(s) before treatment with this medicine. If you experience allergic reactions like skin rash, itching or hives, swelling of the face, lips, or tongue, tell your doctor or health care professional right away. In some cases, you may be given additional medicines to help with side effects. Follow all directions for their use. This drug may make you feel generally unwell. This is not uncommon, as chemotherapy can affect healthy cells as well as cancer cells. Report any side effects. Continue your course of treatment even though you feel ill unless your doctor tells you to stop. Call your doctor or health care professional for advice if you get a fever, chills or sore throat, or other symptoms of a cold or flu. Do not treat yourself. This drug decreases your body's ability to fight infections. Try to avoid being around people who are sick. This medicine may increase your risk to bruise or bleed. Call your doctor or health care professional if you notice any unusual bleeding. Be careful brushing and flossing your teeth or using a toothpick because you may get an infection or bleed more easily. If you have any dental work done, tell your dentist you are receiving this medicine. Avoid taking products that contain aspirin, acetaminophen, ibuprofen, naproxen, or ketoprofen unless instructed by your doctor. These medicines may hide a fever. Do not become pregnant while taking this medicine. Women should inform their doctor if they wish to  become pregnant or think they might be pregnant. There is a potential for serious side effects to an unborn child. Talk to your health care professional or pharmacist for more information. Do not breast-feed an infant while taking this medicine. Men are advised not to father a child while receiving this medicine. This product may contain alcohol. Ask your pharmacist or healthcare provider if this medicine contains alcohol. Be sure to tell all healthcare providers you are taking this medicine. Certain medicines, like metronidazole and disulfiram, can cause an unpleasant reaction when taken with alcohol. The reaction includes flushing, headache, nausea, vomiting, sweating, and increased thirst. The reaction can last from 30 minutes to several hours. What side effects may I notice from receiving this medicine? Side effects that you should report to your doctor or health care professional as soon as possible: -allergic reactions like skin rash, itching or hives, swelling of the face, lips, or tongue -low blood counts - This drug may decrease the number of white blood cells, red blood cells and platelets. You may be at increased risk for infections and bleeding. -signs of infection - fever or chills, cough, sore throat, pain or difficulty passing urine -signs of decreased platelets or bleeding - bruising, pinpoint red spots on the skin, black, tarry stools, nosebleeds -signs of decreased red blood cells - unusually weak or tired, fainting spells, lightheadedness -breathing problems -chest pain -high or  low blood pressure -mouth sores -nausea and vomiting -pain, swelling, redness or irritation at the injection site -pain, tingling, numbness in the hands or feet -slow or irregular heartbeat -swelling of the ankle, feet, hands Side effects that usually do not require medical attention (report to your doctor or health care professional if they continue or are bothersome): -bone pain -complete hair loss  including hair on your head, underarms, pubic hair, eyebrows, and eyelashes -changes in the color of fingernails -diarrhea -loosening of the fingernails -loss of appetite -muscle or joint pain -red flush to skin -sweating This list may not describe all possible side effects. Call your doctor for medical advice about side effects. You may report side effects to FDA at 1-800-FDA-1088. Where should I keep my medicine? This drug is given in a hospital or clinic and will not be stored at home. NOTE: This sheet is a summary. It may not cover all possible information. If you have questions about this medicine, talk to your doctor, pharmacist, or health care provider.  2018 Elsevier/Gold Standard (2015-07-02 19:58:00)

## 2017-08-14 ENCOUNTER — Other Ambulatory Visit: Payer: Self-pay

## 2017-08-14 ENCOUNTER — Emergency Department (HOSPITAL_COMMUNITY)
Admission: EM | Admit: 2017-08-14 | Discharge: 2017-08-15 | Disposition: A | Discharge status: Home / Self Care | Payer: Medicaid Other | Attending: Emergency Medicine | Admitting: Emergency Medicine

## 2017-08-14 ENCOUNTER — Encounter (HOSPITAL_COMMUNITY): Payer: Self-pay | Admitting: Nurse Practitioner

## 2017-08-14 DIAGNOSIS — J309 Allergic rhinitis, unspecified: Secondary | ICD-10-CM | POA: Diagnosis not present

## 2017-08-14 DIAGNOSIS — D509 Iron deficiency anemia, unspecified: Secondary | ICD-10-CM | POA: Insufficient documentation

## 2017-08-14 DIAGNOSIS — E119 Type 2 diabetes mellitus without complications: Secondary | ICD-10-CM | POA: Insufficient documentation

## 2017-08-14 DIAGNOSIS — Z79899 Other long term (current) drug therapy: Secondary | ICD-10-CM | POA: Insufficient documentation

## 2017-08-14 DIAGNOSIS — Z7984 Long term (current) use of oral hypoglycemic drugs: Secondary | ICD-10-CM | POA: Insufficient documentation

## 2017-08-14 DIAGNOSIS — H5712 Ocular pain, left eye: Secondary | ICD-10-CM | POA: Diagnosis present

## 2017-08-14 DIAGNOSIS — H1012 Acute atopic conjunctivitis, left eye: Secondary | ICD-10-CM | POA: Insufficient documentation

## 2017-08-14 MED ORDER — FLUORESCEIN SODIUM 1 MG OP STRP: 1.0000 | ORAL_STRIP | Freq: Once | OPHTHALMIC | Status: DC

## 2017-08-14 MED ORDER — FLUORESCEIN SODIUM 1 MG OP STRP
ORAL_STRIP | OPHTHALMIC | Status: AC
  Filled 2017-08-14: qty 2

## 2017-08-14 MED ORDER — TETRACAINE HCL 0.5 % OP SOLN
OPHTHALMIC | Status: AC
  Filled 2017-08-14: qty 4

## 2017-08-14 MED ORDER — TETRACAINE HCL 0.5 % OP SOLN: 2.0000 [drp] | Freq: Once | OPHTHALMIC | Status: DC

## 2017-08-14 NOTE — ED Provider Notes (Signed)
[facility] DEPT Provider Note   CSN: [id number] Arrival date & time: 08/14/17  1700     History   Chief Complaint Chief Complaint  Patient presents with  . Eye Pain    HPI Erin [name] is a 56 y.o. female.  Patient presents with left eye pain and redness since earlier today. Patient is deaf and mother is at bedside interpreting. She was recently seen and treated for similar presentation that started in her right eye, then in both eyes, resolving with eye drops given to her by her optometrist. She has since followed up with this provider and mom/patient report having a normal exam. They also state eye pressures were measured during the treatment process and were always normal. No visual changes or eye discharge. She reports some clear nasal congestion and a history of allergies.    The history is provided by the patient. No language interpreter was used.  Eye Pain  Pertinent negatives include no headaches and no shortness of breath.    Past Medical History:  Diagnosis Date  . Breast cancer ([place]) 05/2017   left  . Cough 06/03/2017  . Deaf   . Dyspnea    states SOB with ADLs and at night - no home O2  . High cholesterol   . Non-insulin dependent type 2 diabetes mellitus ([place])   . Runny nose 06/03/2017   clear drainage, per pt.    Patient Active Problem List   Diagnosis Date Noted  . Port-A-Cath in place 07/23/2017  . Malignant neoplasm of lower-outer quadrant of left breast of female, estrogen receptor negative ([place]) 05/13/2017  . Abdominal bloating 04/02/2017  . Chronic right-sided low back pain without sciatica 07/02/2016  . DM type 2 (diabetes mellitus, type 2) ([place]) 11/18/2014  . Intermittent constipation 06/06/2010  . UNSPECIFIED IRON DEFICIENCY ANEMIA 04/22/2010  . DEPRESSION 04/10/2010  . DEAFNESS 04/10/2010    Past Surgical History:  Procedure Laterality Date  . BREAST LUMPECTOMY WITH RADIOACTIVE SEED AND SENTINEL LYMPH  NODE BIOPSY Left 06/09/2017   Procedure: BREAST LUMPECTOMY WITH RADIOACTIVE SEED AND SENTINEL LYMPH NODE BIOPSY;  Surgeon: Excell Seltzer, MD;  Location: [facility];  Service: General;  Laterality: Left;  . PORTACATH PLACEMENT Right 06/09/2017   Procedure: INSERTION PORT-A-CATH WITH [place];  Surgeon: Excell Seltzer, MD;  Location: [facility];  Service: General;  Laterality: Right;  . TUBAL LIGATION  02/04/2002    OB History    Gravida Para Term Preterm AB Living   _0 SAB TAB Ectopic Multiple Live Births   1               Home Medications    Prior to Admission medications   Medication Sig Start Date End Date Taking? Authorizing Provider  atorvastatin (LIPITOR) 40 MG tablet Take 1 tablet (40 mg total) by mouth daily. 04/05/17   Funches, Adriana Mccallum, MD  Blood Glucose Monitoring Suppl (ACCU-CHEK AVIVA PLUS) W/DEVICE KIT Check Blood sugar AM fasting and before dinner 12/17/14   Chari Manning A, NP  cholecalciferol (VITAMIN D) 1000 units tablet Take 1,000 Units by mouth daily.    [provider]  ciclopirox (PENLAC) 8 % solution Apply topically at bedtime. Apply over nail and surrounding skin. Apply daily over previous coat. After seven (7) days, may remove with alcohol and continue cycle. 06/22/17   [name], FNP  Elastic Bandages & Supports (LUMBAR BACK BRACE/SUPPORT PAD) MISC 1  each by Does not apply route daily. 07/01/16   Boykin Nearing, MD  ferrous sulfate 325 (65 FE) MG tablet Take 1 tablet (325 mg total) by mouth daily with breakfast. 05/27/17   Argentina Donovan, PA-C  glucose blood (ACCU-CHEK AVIVA) test strip Use as instructed 12/17/14   Lance Bosch, NP  Lancet Devices [facility]) lancets Use as instructed 12/17/14   Lance Bosch, NP  lidocaine-prilocaine (EMLA) cream Apply to affected area once 06/17/17   [name], MD  lisinopril (PRINIVIL,ZESTRIL) 2.5 MG tablet Take 1 tablet (2.5 mg total) by mouth daily.  06/22/17   [name], FNP  metFORMIN (GLUCOPHAGE XR) 500 MG 24 hr tablet Take 2 tablets (1,000 mg total) by mouth daily with breakfast. 04/02/17   Boykin Nearing, MD  Omega-3 Fatty Acids (FISH OIL OMEGA-3 PO) Take by mouth.    [provider]  omeprazole (PRILOSEC) 40 MG capsule Take 1 capsule (40 mg total) daily by mouth. 07/23/17   Causey, Charlestine Massed, NP  ondansetron (ZOFRAN) 8 MG tablet Take 1 tablet (8 mg total) by mouth 2 (two) times daily as needed (Nausea or vomiting). 06/17/17   [name], MD  oxyCODONE (OXY IR/ROXICODONE) 5 MG immediate release tablet Take 1 tablet (5 mg total) by mouth every 6 (six) hours as needed for severe pain. 06/09/17   Excell Seltzer, MD  prochlorperazine (COMPAZINE) 10 MG tablet Take 1 tablet (10 mg total) by mouth every 6 (six) hours as needed (Nausea or vomiting). 06/17/17   [name], MD  TRUEPLUS LANCETS 28G MISC USE AS DIRECTED BY PHYSICIAN 03/13/15   Lance Bosch, NP    Family History Family History  Problem Relation Age of Onset  . Hypertension Mother   . Diabetes Father   . Hypertension Father   . Hypertension Sister   . Cancer Paternal Grandmother   . Diabetes Paternal Grandmother     Social History Social History   Tobacco Use  . Smoking status: Never Smoker  . Smokeless tobacco: Never Used  Substance Use Topics  . Alcohol use: No  . Drug use: No     Allergies   Patient has no known allergies.   Review of Systems Review of Systems  Constitutional: Negative for fever.  HENT: Positive for rhinorrhea.   Eyes: Positive for photophobia, pain and redness. Negative for discharge, itching and visual disturbance.  Respiratory: Negative for shortness of breath.   Gastrointestinal: Negative for nausea and vomiting.  Neurological: Negative for headaches.     Physical Exam Updated Vital Signs BP (!) 137/52 (BP Location: Right Arm)   Pulse (!) 103   Temp 98.3 F (36.8 C) (Oral)   Resp 18   Ht 5'  1" (1.549 m)   Wt 73.9 kg (163 lb)   LMP 06/09/2017   SpO2 99%   BMI 30.80 kg/m   Physical Exam  Constitutional: She appears well-developed and well-nourished. No distress.  HENT:  Head: Normocephalic.  Eyes:  Left eye is red. No chemosis or significant conjunctival swelling. Cornea clear. PERRL. FROM. No eye lid swelling or redness.   Neck: Normal range of motion. Neck supple.  Nursing note and vitals reviewed.    ED Treatments / Results  Labs (all labs ordered are listed, but only abnormal results are displayed) Labs Reviewed - No data to display  EKG  EKG Interpretation None       Radiology No results found.  Procedures Procedures (including critical care time)  Medications Ordered  in ED Medications  tetracaine (PONTOCAINE) 0.5 % ophthalmic solution 2 drop (not administered)  fluorescein ophthalmic strip 1 strip (not administered)     Initial Impression / Assessment and Plan / ED Course  I have reviewed the triage vital signs and the nursing notes.  Pertinent labs & imaging results that were available during my care of the patient were reviewed by me and considered in my medical decision making (see chart for details).     Patient with recurrent eye redness without discharge. Given recurrence of condition, eye redness, history of allergies with active rhinitis, feel symptoms represent allergic conjunctivitis.Recommend Nafcon drops (OTC) and referral to ophthalmologist.   Final Clinical Impressions(s) / ED Diagnoses   Final diagnoses:  None   1. Allergic conjunctivitis  ED Discharge Orders    None       Charlann Lange, PA-C 08/14/17 2357    Tegeler, Gwenyth Allegra, MD 08/15/17 [phone]

## 2017-08-14 NOTE — ED Triage Notes (Signed)
Pt c/o left eye pain. She reports she saw the eye doctor last on Tuesday and was given eye drops due to inflammation and pain. She was being treated for the right eye and that got better and now it is the left. Patient rates pain 7/10. Pt denies any blurred vision or double vision, headaches, or dizziness. Patient reports the eye is very sensitive to light. Patient is an active breast cancer patient undergoing chemotherapy.  Patient is death and interpretor

## 2017-08-14 NOTE — Discharge Instructions (Signed)
Symptoms and history are consistent with allergic type eye irritation. Recommend Nafcon eye drops which are available over the counter. Follow up with your eye doctor or with Dr. Posey Pronto for recheck.

## 2017-08-14 NOTE — ED Notes (Addendum)
Woods lamp and eye medication at bedside. Patient complaining of left eye pain. No reddened eye or any deformities.

## 2017-08-17 ENCOUNTER — Encounter (HOSPITAL_COMMUNITY): Payer: Self-pay | Admitting: Internal Medicine

## 2017-08-17 ENCOUNTER — Ambulatory Visit (HOSPITAL_BASED_OUTPATIENT_CLINIC_OR_DEPARTMENT_OTHER)
Admission: RE | Admit: 2017-08-17 | Discharge: 2017-08-17 | Disposition: A | Discharge status: Home / Self Care | Payer: Medicaid Other | Source: Ambulatory Visit | Attending: Internal Medicine | Admitting: Internal Medicine

## 2017-08-17 ENCOUNTER — Ambulatory Visit (HOSPITAL_COMMUNITY)
Admission: RE | Admit: 2017-08-17 | Discharge: 2017-08-17 | Disposition: A | Discharge status: Home / Self Care | Payer: Medicaid Other | Source: Ambulatory Visit | Attending: Internal Medicine | Admitting: Internal Medicine

## 2017-08-17 VITALS — BP 142/80 | HR 86 | Wt 164.0 lb

## 2017-08-17 DIAGNOSIS — Z853 Personal history of malignant neoplasm of breast: Secondary | ICD-10-CM | POA: Insufficient documentation

## 2017-08-17 DIAGNOSIS — Z79899 Other long term (current) drug therapy: Secondary | ICD-10-CM | POA: Insufficient documentation

## 2017-08-17 DIAGNOSIS — C50512 Malignant neoplasm of lower-outer quadrant of left female breast: Secondary | ICD-10-CM

## 2017-08-17 DIAGNOSIS — R0600 Dyspnea, unspecified: Secondary | ICD-10-CM | POA: Insufficient documentation

## 2017-08-17 DIAGNOSIS — E119 Type 2 diabetes mellitus without complications: Secondary | ICD-10-CM | POA: Insufficient documentation

## 2017-08-17 DIAGNOSIS — I10 Essential (primary) hypertension: Secondary | ICD-10-CM | POA: Diagnosis not present

## 2017-08-17 DIAGNOSIS — Z8249 Family history of ischemic heart disease and other diseases of the circulatory system: Secondary | ICD-10-CM | POA: Diagnosis not present

## 2017-08-17 DIAGNOSIS — Z809 Family history of malignant neoplasm, unspecified: Secondary | ICD-10-CM | POA: Insufficient documentation

## 2017-08-17 DIAGNOSIS — H9190 Unspecified hearing loss, unspecified ear: Secondary | ICD-10-CM | POA: Insufficient documentation

## 2017-08-17 DIAGNOSIS — Z7984 Long term (current) use of oral hypoglycemic drugs: Secondary | ICD-10-CM | POA: Insufficient documentation

## 2017-08-17 DIAGNOSIS — Z9221 Personal history of antineoplastic chemotherapy: Secondary | ICD-10-CM | POA: Diagnosis not present

## 2017-08-17 DIAGNOSIS — Z171 Estrogen receptor negative status [ER-]: Secondary | ICD-10-CM | POA: Insufficient documentation

## 2017-08-17 DIAGNOSIS — Z833 Family history of diabetes mellitus: Secondary | ICD-10-CM | POA: Insufficient documentation

## 2017-08-17 DIAGNOSIS — Z79891 Long term (current) use of opiate analgesic: Secondary | ICD-10-CM | POA: Diagnosis not present

## 2017-08-17 DIAGNOSIS — E7800 Pure hypercholesterolemia, unspecified: Secondary | ICD-10-CM | POA: Diagnosis not present

## 2017-08-17 DIAGNOSIS — Z1501 Genetic susceptibility to malignant neoplasm of breast: Secondary | ICD-10-CM | POA: Diagnosis not present

## 2017-08-17 NOTE — Progress Notes (Signed)
[facility] New CONSULT NOTE   Referring Physician: Dr [name]  Primary Care: Primary Cardiologist:None   HPI: Erin [name] is 56 year old with h/o deaf, DMII, HTN, left breat cancer referred to the Cardio- Oncology clinic by Dr [name] .   Overall feeling fine. Denies SOB/PND/Orthopnea. No chest pain.  Appetite ok. No fever or chills. Taking all medications.    Malignant neoplasm of lower-outer quadrant of left breast of female, estrogen receptor negative ([place])   05/11/2017 Initial Diagnosis    Left breast biopsy 3:30 position 6 cm from nipple: IDC with DCIS, lymphovascular invasion present, grade 2-3, ER 0%, PR 0%, HER-2 positive ratio 2.28, Ki-67 20%, 1 cm lesion left breast, T1b N0 stage IA clinical stage      06/09/2017 Surgery    Left lumpectomy: IDC grade 3, 1.2 cm, DCIS is present, margins negative, 0/6 lymph nodes negative, ER 0%, PR 0%, HER-2 positive ratio 2.28, Ki-67 20%, T1 BN 0 stage IA      07/09/2017 -  Adjuvant Chemotherapy    Taxol/Herceptin     ECHO 08/17/2017: EF 55-60% GLS -18.7% RV normal.  ECHO 05/2017 EF 60-65% Lat S' GLS     Review of Systems: [y] = yes, [ ]  = no   General: Weight gain [ ] ; Weight loss [ ] ; Anorexia [ ] ; Fatigue [ ] ; Fever [ ] ; Chills [ ] ; Weakness [ ]   Cardiac: Chest pain/pressure [ ] ; Resting SOB [ ] ; Exertional SOB [ ] ; Orthopnea [ ] ; Pedal Edema [ ] ; Palpitations [ ] ; Syncope [ ] ; Presyncope [ ] ; Paroxysmal nocturnal dyspnea[ ]   Pulmonary: Cough [ ] ; Wheezing[ ] ; Hemoptysis[ ] ; Sputum [ ] ; Snoring [ ]   GI: Vomiting[ ] ; Dysphagia[ ] ; Melena[ ] ; Hematochezia [ ] ; Heartburn[ ] ; Abdominal pain [ ] ; Constipation [ ] ; Diarrhea [ ] ; BRBPR [ ]   GU: Hematuria[ ] ; Dysuria [ ] ; Nocturia[ ]   Vascular: Pain in legs with walking [ ] ; Pain in feet with lying flat [ ] ; Non-healing sores [ ] ; Stroke [ ] ; TIA [ ] ; Slurred speech [ ] ;  Neuro: Headaches[ ] ; Vertigo[ ] ; Seizures[ ] ; Paresthesias[ ] ;Blurred vision [ ] ; Diplopia [ ] ;  Vision changes [ ]   Ortho/Skin: Arthritis [ ] ; Joint pain [ ] ; Muscle pain [ ] ; Joint swelling [ ] ; Back Pain [ ] ; Rash [ ]   Psych: Depression[ ] ; Anxiety[ ]   Heme: Bleeding problems [ ] ; Clotting disorders [ ] ; Anemia [ ]   Endocrine: Diabetes [Y ]; Thyroid dysfunction[ ]    Past Medical History:  Diagnosis Date  . Breast cancer ([place]) 05/2017   left  . Cough 06/03/2017  . Deaf   . Dyspnea    states SOB with ADLs and at night - no home O2  . High cholesterol   . Non-insulin dependent type 2 diabetes mellitus ([place])   . Runny nose 06/03/2017   clear drainage, per pt.    Current Outpatient Medications  Medication Sig Dispense Refill  . atorvastatin (LIPITOR) 40 MG tablet Take 1 tablet (40 mg total) by mouth daily. 90 tablet 3  . Blood Glucose Monitoring Suppl (ACCU-CHEK AVIVA PLUS) W/DEVICE KIT Check Blood sugar AM fasting and before dinner 1 kit 0  . cholecalciferol (VITAMIN D) 1000 units tablet Take 1,000 Units by mouth daily.    . ciclopirox (PENLAC) 8 % solution Apply topically at bedtime. Apply over nail and surrounding skin. Apply daily over previous coat. After seven (7) days, may remove with alcohol and continue cycle. 6.6  mL 1  . Elastic Bandages & Supports (LUMBAR BACK BRACE/SUPPORT PAD) MISC 1 each by Does not apply route daily. 1 each 0  . ferrous sulfate 325 (65 FE) MG tablet Take 1 tablet (325 mg total) by mouth daily with breakfast. 30 tablet 3  . glucose blood (ACCU-CHEK AVIVA) test strip Use as instructed 100 each 12  . Lancet Devices (ACCU-CHEK SOFTCLIX) lancets Use as instructed 1 each 0  . lidocaine-prilocaine (EMLA) cream Apply to affected area once 30 g 3  . lisinopril (PRINIVIL,ZESTRIL) 2.5 MG tablet Take 1 tablet (2.5 mg total) by mouth daily. 30 tablet 2  . metFORMIN (GLUCOPHAGE XR) 500 MG 24 hr tablet Take 2 tablets (1,000 mg total) by mouth daily with breakfast. 60 tablet 11  . Omega-3 Fatty Acids (FISH OIL OMEGA-3 PO) Take by mouth.    Marland Kitchen omeprazole (PRILOSEC)  40 MG capsule Take 1 capsule (40 mg total) daily by mouth. 30 capsule 0  . ondansetron (ZOFRAN) 8 MG tablet Take 1 tablet (8 mg total) by mouth 2 (two) times daily as needed (Nausea or vomiting). 30 tablet 1  . oxyCODONE (OXY IR/ROXICODONE) 5 MG immediate release tablet Take 1 tablet (5 mg total) by mouth every 6 (six) hours as needed for severe pain. 10 tablet 0  . prochlorperazine (COMPAZINE) 10 MG tablet Take 1 tablet (10 mg total) by mouth every 6 (six) hours as needed (Nausea or vomiting). 30 tablet 1  . TRUEPLUS LANCETS 28G MISC USE AS DIRECTED BY PHYSICIAN 100 each 0   No current facility-administered medications for this encounter.     No Known Allergies    Social History   Socioeconomic History  . Marital status: Legally Separated    Spouse name: Not on file  . Number of children: Not on file  . Years of education: Not on file  . Highest education level: Not on file  Social Needs  . Financial resource strain: Not on file  . Food insecurity - worry: Not on file  . Food insecurity - inability: Not on file  . Transportation needs - medical: Not on file  . Transportation needs - non-medical: Not on file  Occupational History  . Not on file  Tobacco Use  . Smoking status: Never Smoker  . Smokeless tobacco: Never Used  Substance and Sexual Activity  . Alcohol use: No  . Drug use: No  . Sexual activity: No  Other Topics Concern  . Not on file  Social History Narrative  . Not on file      Family History  Problem Relation Age of Onset  . Hypertension Mother   . Diabetes Father   . Hypertension Father   . Hypertension Sister   . Cancer Paternal Grandmother   . Diabetes Paternal Grandmother     Vitals:   08/17/17 1154  BP: (!) 142/80  Pulse: 86  SpO2: 100%  Weight: 164 lb (74.4 kg)    PHYSICAL EXAM: General:  Well appearing. No respiratory difficulty HEENT: normal Neck: supple. no JVD. Carotids 2+ bilat; no bruits. No lymphadenopathy or thryomegaly  appreciated. Cor: PMI nondisplaced. Regular rate & rhythm. No rubs, gallops or murmurs. R upper chest porta cath.  Lungs: clear Abdomen: soft, nontender, nondistended. No hepatosplenomegaly. No bruits or masses. Good bowel sounds. Extremities: no cyanosis, clubbing, rash, edema Neuro: alert & oriented x 3, cranial nerves grossly intact. moves all 4 extremities w/o difficulty. Affect pleasant.     ASSESSMENT & PLAN:  1. Breast Cancer, left  T1b N0 stage IA clinical stage Explained incidence of Herceptin cardiotoxicity and role of Cardio-oncology clinic at length. Echo images reviewed personally by Dr. Aundra Dubin.  All parameters stable. Reviewed signs and symptoms of HF to look for. Ok to continue chemotherapy. Follow-up with echo in 3 months.  Plan to follow every 3 months while on Herceptin.  Greater than 50% of the total minutes 30 visit spent in counseling/coordination of care regarding above.    Amy Clegg NP-C  1:36 PM

## 2017-08-17 NOTE — Patient Instructions (Signed)
Follow up 3 months with echocardiogram and appointment with Dr. Haroldine Laws.

## 2017-08-17 NOTE — Progress Notes (Signed)
  Echocardiogram 2D Echocardiogram has been performed.  Erin [name] 08/17/2017, 11:55 AM

## 2017-08-18 ENCOUNTER — Ambulatory Visit: Payer: Medicare Other

## 2017-08-18 ENCOUNTER — Encounter: Payer: Self-pay | Admitting: Adult Health

## 2017-08-18 ENCOUNTER — Other Ambulatory Visit: Payer: Medicare Other

## 2017-08-18 ENCOUNTER — Ambulatory Visit: Payer: Medicare Other | Admitting: Hematology and Oncology

## 2017-08-18 ENCOUNTER — Other Ambulatory Visit (HOSPITAL_BASED_OUTPATIENT_CLINIC_OR_DEPARTMENT_OTHER): Payer: Medicaid Other

## 2017-08-18 ENCOUNTER — Ambulatory Visit (HOSPITAL_BASED_OUTPATIENT_CLINIC_OR_DEPARTMENT_OTHER): Payer: Medicaid Other

## 2017-08-18 ENCOUNTER — Ambulatory Visit (HOSPITAL_BASED_OUTPATIENT_CLINIC_OR_DEPARTMENT_OTHER): Payer: Medicare Other | Admitting: Adult Health

## 2017-08-18 ENCOUNTER — Encounter: Payer: Medicare Other | Admitting: Family Medicine

## 2017-08-18 VITALS — BP 150/79 | HR 94 | Temp 98.5°F | Resp 18 | Wt 163.9 lb

## 2017-08-18 DIAGNOSIS — Z5111 Encounter for antineoplastic chemotherapy: Secondary | ICD-10-CM

## 2017-08-18 DIAGNOSIS — C50512 Malignant neoplasm of lower-outer quadrant of left female breast: Secondary | ICD-10-CM

## 2017-08-18 DIAGNOSIS — Z171 Estrogen receptor negative status [ER-]: Principal | ICD-10-CM

## 2017-08-18 DIAGNOSIS — Z5112 Encounter for antineoplastic immunotherapy: Secondary | ICD-10-CM | POA: Diagnosis not present

## 2017-08-18 DIAGNOSIS — Z95828 Presence of other vascular implants and grafts: Secondary | ICD-10-CM

## 2017-08-18 LAB — CBC WITH DIFFERENTIAL/PLATELET
BASO%: 0.5 % (ref 0.0–2.0)
Basophils Absolute: 0.1 10*3/uL (ref 0.0–0.1)
EOS ABS: 0.1 10*3/uL (ref 0.0–0.5)
EOS%: 1.3 % (ref 0.0–7.0)
HCT: 28.2 % — ABNORMAL LOW (ref 34.8–46.6)
HEMOGLOBIN: 9.1 g/dL — AB (ref 11.6–15.9)
LYMPH%: 22.2 % (ref 14.0–49.7)
MCH: 27 pg (ref 25.1–34.0)
MCHC: 32.3 g/dL (ref 31.5–36.0)
MCV: 83.6 fL (ref 79.5–101.0)
MONO#: 0.7 10*3/uL (ref 0.1–0.9)
MONO%: 6 % (ref 0.0–14.0)
NEUT%: 70 % (ref 38.4–76.8)
NEUTROS ABS: 7.7 10*3/uL — AB (ref 1.5–6.5)
Platelets: 367 10*3/uL (ref 145–400)
RBC: 3.38 10*6/uL — AB (ref 3.70–5.45)
RDW: 18.5 % — AB (ref 11.2–14.5)
WBC: 10.9 10*3/uL — AB (ref 3.9–10.3)
lymph#: 2.4 10*3/uL (ref 0.9–3.3)

## 2017-08-18 LAB — COMPREHENSIVE METABOLIC PANEL
ALBUMIN: 3.3 g/dL — AB (ref 3.5–5.0)
ALK PHOS: 81 U/L (ref 40–150)
ALT: 18 U/L (ref 0–55)
ANION GAP: 11 meq/L (ref 3–11)
AST: 12 U/L (ref 5–34)
BUN: 8.6 mg/dL (ref 7.0–26.0)
CALCIUM: 9.3 mg/dL (ref 8.4–10.4)
CHLORIDE: 106 meq/L (ref 98–109)
CO2: 22 mEq/L (ref 22–29)
CREATININE: 0.8 mg/dL (ref 0.6–1.1)
EGFR: >60 mL/min/{1.73_m2} (ref >60)
Glucose: 195 mg/dl — ABNORMAL HIGH (ref 70–140)
Potassium: 3.8 mEq/L (ref 3.5–5.1)
Sodium: 138 mEq/L (ref 136–145)
Total Bilirubin: 0.25 mg/dL (ref 0.20–1.20)
Total Protein: 7.4 g/dL (ref 6.4–8.3)

## 2017-08-18 LAB — TECHNOLOGIST REVIEW

## 2017-08-18 MED ORDER — SODIUM CHLORIDE 0.9 % IV SOLN
Freq: Once | INTRAVENOUS | Status: AC
  Administered 2017-08-18: 14:00:00 via INTRAVENOUS

## 2017-08-18 MED ORDER — DIPHENHYDRAMINE HCL 50 MG/ML IJ SOLN
INTRAMUSCULAR | Status: AC
  Filled 2017-08-18: qty 1

## 2017-08-18 MED ORDER — DIPHENHYDRAMINE HCL 50 MG/ML IJ SOLN
25.0000 mg | Freq: Once | INTRAMUSCULAR | Status: AC
  Administered 2017-08-18: 25 mg via INTRAVENOUS

## 2017-08-18 MED ORDER — DEXAMETHASONE SODIUM PHOSPHATE 10 MG/ML IJ SOLN
INTRAMUSCULAR | Status: AC
  Filled 2017-08-18: qty 1

## 2017-08-18 MED ORDER — SODIUM CHLORIDE 0.9% FLUSH
10.0000 mL | INTRAVENOUS | Status: DC | PRN
  Administered 2017-08-18: 10 mL via INTRAVENOUS
  Filled 2017-08-18: qty 10

## 2017-08-18 MED ORDER — FAMOTIDINE IN NACL 20-0.9 MG/50ML-% IV SOLN
INTRAVENOUS | Status: AC
  Filled 2017-08-18: qty 50

## 2017-08-18 MED ORDER — PACLITAXEL CHEMO INJECTION 300 MG/50ML: 80.0000 mg/m2 | Freq: Once | INTRAVENOUS | Status: DC

## 2017-08-18 MED ORDER — ACETAMINOPHEN 325 MG PO TABS
650.0000 mg | ORAL_TABLET | Freq: Once | ORAL | Status: AC
  Administered 2017-08-18: 650 mg via ORAL

## 2017-08-18 MED ORDER — DEXAMETHASONE SODIUM PHOSPHATE 10 MG/ML IJ SOLN
10.0000 mg | Freq: Once | INTRAMUSCULAR | Status: AC
  Administered 2017-08-18: 10 mg via INTRAVENOUS

## 2017-08-18 MED ORDER — HEPARIN SOD (PORK) LOCK FLUSH 100 UNIT/ML IV SOLN
500.0000 [IU] | Freq: Once | INTRAVENOUS | Status: AC | PRN
  Administered 2017-08-18: 500 [IU]
  Filled 2017-08-18: qty 5

## 2017-08-18 MED ORDER — ACETAMINOPHEN 325 MG PO TABS
ORAL_TABLET | ORAL | Status: AC
  Filled 2017-08-18: qty 2

## 2017-08-18 MED ORDER — SODIUM CHLORIDE 0.9 % IV SOLN
80.0000 mg/m2 | Freq: Once | INTRAVENOUS | Status: AC
  Administered 2017-08-18: 144 mg via INTRAVENOUS
  Filled 2017-08-18: qty 24

## 2017-08-18 MED ORDER — FAMOTIDINE IN NACL 20-0.9 MG/50ML-% IV SOLN
20.0000 mg | Freq: Once | INTRAVENOUS | Status: AC
Start: 2017-08-18 — End: 2017-08-18
  Administered 2017-08-18: 20 mg via INTRAVENOUS

## 2017-08-18 MED ORDER — TRASTUZUMAB CHEMO 150 MG IV SOLR
150.0000 mg | Freq: Once | INTRAVENOUS | Status: AC
  Administered 2017-08-18: 150 mg via INTRAVENOUS
  Filled 2017-08-18: qty 7.14

## 2017-08-18 MED ORDER — SODIUM CHLORIDE 0.9% FLUSH
10.0000 mL | INTRAVENOUS | Status: DC | PRN
  Administered 2017-08-18: 10 mL
  Filled 2017-08-18: qty 10

## 2017-08-18 NOTE — Patient Instructions (Signed)
Take allegra or claritin daily, use flonase.   Allergic Rhinitis Allergic rhinitis is when the mucous membranes in the nose respond to allergens. Allergens are particles in the air that cause your body to have an allergic reaction. This causes you to release allergic antibodies. Through a chain of events, these eventually cause you to release histamine into the blood stream. Although meant to protect the body, it is this release of histamine that causes your discomfort, such as frequent sneezing, congestion, and an itchy, runny nose. What are the causes? Seasonal allergic rhinitis (hay fever) is caused by pollen allergens that may come from grasses, trees, and weeds. Year-round allergic rhinitis (perennial allergic rhinitis) is caused by allergens such as house dust mites, pet dander, and mold spores. What are the signs or symptoms?  Nasal stuffiness (congestion).  Itchy, runny nose with sneezing and tearing of the eyes. How is this diagnosed? Your health care provider can help you determine the allergen or allergens that trigger your symptoms. If you and your health care provider are unable to determine the allergen, skin or blood testing may be used. Your health care provider will diagnose your condition after taking your health history and performing a physical exam. Your health care provider may assess you for other related conditions, such as asthma, pink eye, or an ear infection. How is this treated? Allergic rhinitis does not have a cure, but it can be controlled by:  Medicines that block allergy symptoms. These may include allergy shots, nasal sprays, and oral antihistamines.  Avoiding the allergen.  Hay fever may often be treated with antihistamines in pill or nasal spray forms. Antihistamines block the effects of histamine. There are over-the-counter medicines that may help with nasal congestion and swelling around the eyes. Check with your health care provider before taking or giving  this medicine. If avoiding the allergen or the medicine prescribed do not work, there are many new medicines your health care provider can prescribe. Stronger medicine may be used if initial measures are ineffective. Desensitizing injections can be used if medicine and avoidance does not work. Desensitization is when a patient is given ongoing shots until the body becomes less sensitive to the allergen. Make sure you follow up with your health care provider if problems continue. Follow these instructions at home: It is not possible to completely avoid allergens, but you can reduce your symptoms by taking steps to limit your exposure to them. It helps to know exactly what you are allergic to so that you can avoid your specific triggers. Contact a health care provider if:  You have a fever.  You develop a cough that does not stop easily (persistent).  You have shortness of breath.  You start wheezing.  Symptoms interfere with normal daily activities. This information is not intended to replace advice given to you by your health care provider. Make sure you discuss any questions you have with your health care provider. Document Released: 05/26/2001 Document Revised: 05/01/2016 Document Reviewed: 05/08/2013 Elsevier Interactive Patient Education  2017 [employer].

## 2017-08-18 NOTE — Assessment & Plan Note (Addendum)
06/09/2017: Left lumpectomy: IDC grade 3, 1.2 cm, DCIS is present, margins negative, 0/6 lymph nodes negative, ER 0%, PR 0%, HER-2 positive ratio 2.28, Ki-67 20%, T1 BN 0 stage IA  Treatment plan: 1. Adjuvant chemotherapy with Taxol Herceptin weekly 12 followed by Herceptin every 3 weeks for total of 6 months 2. Followed by adjuvant radiation  Echo on 05/24/17 demonstrates LVEF of 60-65%  ____________________________________________________________________  Current treatment: Cycle 4 Taxol Herceptin Chemo toxicities: 1.  GERD: On PPI therapy 2. Allergic rhinitis: recommended Claritin and Flonase, saline nasal spray, if worsens, or if she develops symptoms consistent with infection, we will call in antibiotics.  Return to clinic weekly for Taxol and Herceptin and every 2 weeks for follow-up with myself or Dr. [name].

## 2017-08-18 NOTE — Patient Instructions (Signed)
[facility] Discharge Instructions for Patients Receiving Chemotherapy  Today you received the following chemotherapy agents :  Herceptin,  Taxol.  To help prevent nausea and vomiting after your treatment, we encourage you to take your nausea medication as prescribed.   If you develop nausea and vomiting that is not controlled by your nausea medication, call the clinic.   BELOW ARE SYMPTOMS THAT SHOULD BE REPORTED IMMEDIATELY:  *FEVER GREATER THAN 100.5 F  *CHILLS WITH OR WITHOUT FEVER  NAUSEA AND VOMITING THAT IS NOT CONTROLLED WITH YOUR NAUSEA MEDICATION  *UNUSUAL SHORTNESS OF BREATH  *UNUSUAL BRUISING OR BLEEDING  TENDERNESS IN MOUTH AND THROAT WITH OR WITHOUT PRESENCE OF ULCERS  *URINARY PROBLEMS  *BOWEL PROBLEMS  UNUSUAL RASH Items with * indicate a potential emergency and should be followed up as soon as possible.  Feel free to call the clinic should you have any questions or concerns. The clinic phone number is [phone].  Please show the CHEMO ALERT CARD at check-in to the Emergency Department and triage nurse.   

## 2017-08-18 NOTE — Progress Notes (Signed)
[facility] Follow up:    Erin Spruce, FNP [place] [address]   DIAGNOSIS: Cancer Staging Malignant neoplasm of lower-outer quadrant of left breast of female, estrogen receptor negative ([place]) Staging form: Breast, AJCC 8th Edition - Clinical stage from 05/18/2017: Stage IA (cT1b, cN0, cM0, G3, ER: Negative, PR: Negative, HER2: Positive) - Unsigned - Pathologic: Stage IA (pT1c, pN0, cM0, G3, ER: Negative, PR: Negative, HER2: Positive) - Signed by [name], NP on 07/07/2017   SUMMARY OF ONCOLOGIC HISTORY:   Malignant neoplasm of lower-outer quadrant of left breast of female, estrogen receptor negative ([place])   05/11/2017 Initial Diagnosis    Left breast biopsy 3:30 position 6 cm from nipple: IDC with DCIS, lymphovascular invasion present, grade 2-3, ER 0%, PR 0%, HER-2 positive ratio 2.28, Ki-67 20%, 1 cm lesion left breast, T1b N0 stage IA clinical stage      06/09/2017 Surgery    Left lumpectomy: IDC grade 3, 1.2 cm, DCIS is present, margins negative, 0/6 lymph nodes negative, ER 0%, PR 0%, HER-2 positive ratio 2.28, Ki-67 20%, T1 BN 0 stage IA      07/09/2017 -  Adjuvant Chemotherapy    Taxol/Herceptin       CURRENT THERAPY: Taxol/Herceptin  INTERVAL HISTORY: Erin [name] 56 y.o. female returns for evaluation prior to receiving Taxol and Herceptin.  Today is week 6.  She is accompanied by her mother and by sign language interpreter, Sarina Ser.    Erin [name] has a "cold".  She describes this as nasal drainage.  No sinus pain/pressure.  Mild intermittent cough.  Nasal drainage is clear, worse at night.  She notes blood tinged drainage in her nasal drainage as well.  No OTC meds taken.  She wasn't sure what to take with the chemotherapy.  No fevers or chills.    She saw Dr. Clayborne Dana NP yesterday, Darrick Grinder in cardiology.  She underwent echocardiogram that was normal as well.    Patient Active Problem List    Diagnosis Date Noted  . Port-A-Cath in place 07/23/2017  . Malignant neoplasm of lower-outer quadrant of left breast of female, estrogen receptor negative ([place]) 05/13/2017  . Abdominal bloating 04/02/2017  . Chronic right-sided low back pain without sciatica 07/02/2016  . DM type 2 (diabetes mellitus, type 2) ([place]) 11/18/2014  . Intermittent constipation 06/06/2010  . UNSPECIFIED IRON DEFICIENCY ANEMIA 04/22/2010  . DEPRESSION 04/10/2010  . DEAFNESS 04/10/2010    has No Known Allergies.  MEDICAL HISTORY: Past Medical History:  Diagnosis Date  . Breast cancer ([place]) 05/2017   left  . Cough 06/03/2017  . Deaf   . Dyspnea    states SOB with ADLs and at night - no home O2  . High cholesterol   . Non-insulin dependent type 2 diabetes mellitus ([place])   . Runny nose 06/03/2017   clear drainage, per pt.    SURGICAL HISTORY: Past Surgical History:  Procedure Laterality Date  . BREAST LUMPECTOMY WITH RADIOACTIVE SEED AND SENTINEL LYMPH NODE BIOPSY Left 06/09/2017   Procedure: BREAST LUMPECTOMY WITH RADIOACTIVE SEED AND SENTINEL LYMPH NODE BIOPSY;  Surgeon: Excell Seltzer, MD;  Location: [facility];  Service: General;  Laterality: Left;  . PORTACATH PLACEMENT Right 06/09/2017   Procedure: INSERTION PORT-A-CATH WITH [place];  Surgeon: Excell Seltzer, MD;  Location: [facility];  Service: General;  Laterality: Right;  . TUBAL LIGATION  02/04/2002    SOCIAL HISTORY: Social History  Socioeconomic History  . Marital status: Legally Separated    Spouse name: Not on file  . Number of children: Not on file  . Years of education: Not on file  . Highest education level: Not on file  Social Needs  . Financial resource strain: Not on file  . Food insecurity - worry: Not on file  . Food insecurity - inability: Not on file  . Transportation needs - medical: Not on file  . Transportation needs - non-medical: Not on file  Occupational History  . Not on file   Tobacco Use  . Smoking status: Never Smoker  . Smokeless tobacco: Never Used  Substance and Sexual Activity  . Alcohol use: No  . Drug use: No  . Sexual activity: No  Other Topics Concern  . Not on file  Social History Narrative  . Not on file    FAMILY HISTORY: Family History  Problem Relation Age of Onset  . Hypertension Mother   . Diabetes Father   . Hypertension Father   . Hypertension Sister   . Cancer Paternal Grandmother   . Diabetes Paternal Grandmother   . CAD Brother     Review of Systems  Constitutional: Positive for fatigue. Negative for appetite change, chills, diaphoresis, fever and unexpected weight change.  HENT:   Negative for hearing loss and lump/mass.   Eyes: Negative for eye problems and icterus.  Respiratory: Positive for cough. Negative for chest tightness and shortness of breath.   Cardiovascular: Negative for chest pain, leg swelling and palpitations.  Gastrointestinal: Negative for abdominal distention, abdominal pain, constipation, diarrhea, nausea and vomiting.  Endocrine: Negative for hot flashes.  Neurological: Negative for dizziness, extremity weakness and numbness.  Hematological: Negative for adenopathy. Does not bruise/bleed easily.  Psychiatric/Behavioral: Negative for depression. The patient is not nervous/anxious.       PHYSICAL EXAMINATION  ECOG PERFORMANCE STATUS: 2 - Symptomatic, <50% confined to bed  Vitals:   08/18/17 0901  BP: (!) 150/79  Pulse: 94  Resp: 18  Temp: 98.5 F (36.9 C)  SpO2: 99%    Physical Exam  Constitutional: She is oriented to person, place, and time and well-developed, well-nourished, and in no distress.  HENT:  Head: Normocephalic and atraumatic.  Right Ear: External ear normal.  Left Ear: External ear normal.  Nose: Nose normal.  Mouth/Throat: Oropharynx is clear and moist. No oropharyngeal exudate.  No sinus tenderness   Eyes: No scleral icterus.  Neck: Neck supple.  Cardiovascular:  Normal rate, regular rhythm and normal heart sounds.  Pulmonary/Chest: Effort normal and breath sounds normal. No respiratory distress. She has no wheezes. She has no rales. She exhibits no tenderness.  Abdominal: Soft. Bowel sounds are normal. She exhibits no distension and no mass. There is no tenderness. There is no rebound and no guarding.  Musculoskeletal: She exhibits no edema.  Lymphadenopathy:    She has no cervical adenopathy.  Neurological: She is alert and oriented to person, place, and time.  Skin: Skin is warm and dry. No rash noted.  Psychiatric: Mood and affect normal.    LABORATORY DATA:  CBC    Component Value Date/Time   WBC 10.9 (H) 08/18/2017 0843   WBC 10.3 10/12/2014 1257   RBC 3.38 (L) 08/18/2017 0843   RBC 4.07 10/12/2014 1257   HGB 9.1 (L) 08/18/2017 0843   HCT 28.2 (L) 08/18/2017 0843   PLT 367 08/18/2017 0843   PLT 375 05/26/2017 0918   MCV 83.6 08/18/2017 0843  MCH 27.0 08/18/2017 0843   MCH 26.5 10/12/2014 1257   MCHC 32.3 08/18/2017 0843   MCHC 31.9 10/12/2014 1257   RDW 18.5 (H) 08/18/2017 0843   LYMPHSABS 2.4 08/18/2017 0843   MONOABS 0.7 08/18/2017 0843   EOSABS 0.1 08/18/2017 0843   EOSABS 0.1 05/26/2017 0918   BASOSABS 0.1 08/18/2017 0843    CMP     Component Value Date/Time   NA 138 08/18/2017 0843   K 3.8 08/18/2017 0843   CL 103 04/02/2017 1208   CO2 22 08/18/2017 0843   GLUCOSE 195 (H) 08/18/2017 0843   BUN 8.6 08/18/2017 0843   CREATININE 0.8 08/18/2017 0843   CALCIUM 9.3 08/18/2017 0843   PROT 7.4 08/18/2017 0843   ALBUMIN 3.3 (L) 08/18/2017 0843   AST 12 08/18/2017 0843   ALT 18 08/18/2017 0843   ALKPHOS 81 08/18/2017 0843   BILITOT 0.25 08/18/2017 0843   GFRNONAA 104 04/02/2017 1208   GFRAA 120 04/02/2017 1208        ASSESSMENT and PLAN:   Malignant neoplasm of lower-outer quadrant of left breast of female, estrogen receptor negative ([place]) 06/09/2017: Left lumpectomy: IDC grade 3, 1.2 cm, DCIS is present,  margins negative, 0/6 lymph nodes negative, ER 0%, PR 0%, HER-2 positive ratio 2.28, Ki-67 20%, T1 BN 0 stage IA  Treatment plan: 1. Adjuvant chemotherapy with Taxol Herceptin weekly 12 followed by Herceptin every 3 weeks for total of 6 months 2. Followed by adjuvant radiation  Echo on 05/24/17 demonstrates LVEF of 60-65%  ____________________________________________________________________  Current treatment: Cycle 4 Taxol Herceptin Chemo toxicities: 1.  GERD: On PPI therapy 2. Allergic rhinitis: recommended Claritin and Flonase, saline nasal spray, if worsens, or if she develops symptoms consistent with infection, we will call in antibiotics.  Return to clinic weekly for Taxol and Herceptin and every 2 weeks for follow-up with myself or Dr. [name].   All questions were answered. The patient knows to call the clinic with any problems, questions or concerns. We can certainly see the patient much sooner if necessary.  A total of (30) minutes of face-to-face time was spent with this patient with greater than 50% of that time in counseling and care-coordination.  This note was electronically signed. Scot Dock, NP 08/18/2017

## 2017-08-23 ENCOUNTER — Encounter: Payer: Medicare Other | Admitting: Obstetrics & Gynecology

## 2017-08-25 ENCOUNTER — Ambulatory Visit (HOSPITAL_BASED_OUTPATIENT_CLINIC_OR_DEPARTMENT_OTHER): Payer: Medicaid Other

## 2017-08-25 ENCOUNTER — Ambulatory Visit: Payer: Medicaid Other

## 2017-08-25 VITALS — BP 140/75 | HR 91 | Temp 97.9°F | Resp 17 | Wt 163.8 lb

## 2017-08-25 DIAGNOSIS — Z5111 Encounter for antineoplastic chemotherapy: Secondary | ICD-10-CM | POA: Diagnosis not present

## 2017-08-25 DIAGNOSIS — Z5112 Encounter for antineoplastic immunotherapy: Secondary | ICD-10-CM

## 2017-08-25 DIAGNOSIS — C50512 Malignant neoplasm of lower-outer quadrant of left female breast: Secondary | ICD-10-CM | POA: Diagnosis not present

## 2017-08-25 DIAGNOSIS — Z95828 Presence of other vascular implants and grafts: Secondary | ICD-10-CM

## 2017-08-25 DIAGNOSIS — Z171 Estrogen receptor negative status [ER-]: Principal | ICD-10-CM

## 2017-08-25 LAB — COMPREHENSIVE METABOLIC PANEL
ALBUMIN: 3.3 g/dL — AB (ref 3.5–5.0)
ALK PHOS: 74 U/L (ref 40–150)
ALT: 21 U/L (ref 0–55)
AST: 15 U/L (ref 5–34)
Anion Gap: 9 mEq/L (ref 3–11)
BUN: 9.8 mg/dL (ref 7.0–26.0)
CALCIUM: 9.3 mg/dL (ref 8.4–10.4)
CO2: 24 mEq/L (ref 22–29)
Chloride: 104 mEq/L (ref 98–109)
Creatinine: 0.8 mg/dL (ref 0.6–1.1)
GLUCOSE: 202 mg/dL — AB (ref 70–140)
POTASSIUM: 3.7 meq/L (ref 3.5–5.1)
SODIUM: 136 meq/L (ref 136–145)
Total Bilirubin: 0.26 mg/dL (ref 0.20–1.20)
Total Protein: 7.3 g/dL (ref 6.4–8.3)

## 2017-08-25 LAB — CBC WITH DIFFERENTIAL/PLATELET
BASO%: 0.5 % (ref 0.0–2.0)
BASOS ABS: 0 10*3/uL (ref 0.0–0.1)
EOS ABS: 0.1 10*3/uL (ref 0.0–0.5)
EOS%: 1 % (ref 0.0–7.0)
HCT: 29.4 % — ABNORMAL LOW (ref 34.8–46.6)
HEMOGLOBIN: 9.4 g/dL — AB (ref 11.6–15.9)
LYMPH%: 28.5 % (ref 14.0–49.7)
MCH: 26.7 pg (ref 25.1–34.0)
MCHC: 32.1 g/dL (ref 31.5–36.0)
MCV: 83.3 fL (ref 79.5–101.0)
MONO#: 0.6 10*3/uL (ref 0.1–0.9)
MONO%: 8.9 % (ref 0.0–14.0)
NEUT#: 4.4 10*3/uL (ref 1.5–6.5)
NEUT%: 61.1 % (ref 38.4–76.8)
Platelets: 353 10*3/uL (ref 145–400)
RBC: 3.53 10*6/uL — ABNORMAL LOW (ref 3.70–5.45)
RDW: 19.5 % — AB (ref 11.2–14.5)
WBC: 7.2 10*3/uL (ref 3.9–10.3)
lymph#: 2 10*3/uL (ref 0.9–3.3)

## 2017-08-25 MED ORDER — FAMOTIDINE IN NACL 20-0.9 MG/50ML-% IV SOLN
INTRAVENOUS | Status: AC
  Filled 2017-08-25: qty 50

## 2017-08-25 MED ORDER — DIPHENHYDRAMINE HCL 50 MG/ML IJ SOLN
INTRAMUSCULAR | Status: AC
  Filled 2017-08-25: qty 1

## 2017-08-25 MED ORDER — DIPHENHYDRAMINE HCL 50 MG/ML IJ SOLN
25.0000 mg | Freq: Once | INTRAMUSCULAR | Status: AC
  Administered 2017-08-25: 25 mg via INTRAVENOUS

## 2017-08-25 MED ORDER — SODIUM CHLORIDE 0.9 % IV SOLN
150.0000 mg | Freq: Once | INTRAVENOUS | Status: AC
  Administered 2017-08-25: 150 mg via INTRAVENOUS
  Filled 2017-08-25: qty 7.14

## 2017-08-25 MED ORDER — ACETAMINOPHEN 325 MG PO TABS
ORAL_TABLET | ORAL | Status: AC
  Filled 2017-08-25: qty 2

## 2017-08-25 MED ORDER — SODIUM CHLORIDE 0.9% FLUSH
10.0000 mL | INTRAVENOUS | Status: DC | PRN
  Administered 2017-08-25: 10 mL via INTRAVENOUS
  Filled 2017-08-25: qty 10

## 2017-08-25 MED ORDER — HEPARIN SOD (PORK) LOCK FLUSH 100 UNIT/ML IV SOLN
500.0000 [IU] | Freq: Once | INTRAVENOUS | Status: AC | PRN
  Administered 2017-08-25: 500 [IU]
  Filled 2017-08-25: qty 5

## 2017-08-25 MED ORDER — FAMOTIDINE IN NACL 20-0.9 MG/50ML-% IV SOLN
20.0000 mg | Freq: Once | INTRAVENOUS | Status: AC
  Administered 2017-08-25: 20 mg via INTRAVENOUS

## 2017-08-25 MED ORDER — DEXAMETHASONE SODIUM PHOSPHATE 10 MG/ML IJ SOLN
INTRAMUSCULAR | Status: AC
  Filled 2017-08-25: qty 1

## 2017-08-25 MED ORDER — SODIUM CHLORIDE 0.9 % IV SOLN
80.0000 mg/m2 | Freq: Once | INTRAVENOUS | Status: AC
  Administered 2017-08-25: 144 mg via INTRAVENOUS
  Filled 2017-08-25: qty 24

## 2017-08-25 MED ORDER — SODIUM CHLORIDE 0.9 % IV SOLN
Freq: Once | INTRAVENOUS | Status: AC
  Administered 2017-08-25: 09:00:00 via INTRAVENOUS

## 2017-08-25 MED ORDER — SODIUM CHLORIDE 0.9% FLUSH
10.0000 mL | INTRAVENOUS | Status: DC | PRN
  Administered 2017-08-25: 10 mL
  Filled 2017-08-25: qty 10

## 2017-08-25 MED ORDER — DEXAMETHASONE SODIUM PHOSPHATE 10 MG/ML IJ SOLN
10.0000 mg | Freq: Once | INTRAMUSCULAR | Status: AC
  Administered 2017-08-25: 10 mg via INTRAVENOUS

## 2017-08-25 MED ORDER — ACETAMINOPHEN 325 MG PO TABS
650.0000 mg | ORAL_TABLET | Freq: Once | ORAL | Status: AC
  Administered 2017-08-25: 650 mg via ORAL

## 2017-08-25 NOTE — Patient Instructions (Signed)
[facility] Discharge Instructions for Patients Receiving Chemotherapy  Today you received the following chemotherapy agents: Trastuzumab (Herceptin) and Paclitaxel (Taxol)  To help prevent nausea and vomiting after your treatment, we encourage you to take your nausea medication  as prescribed.    If you develop nausea and vomiting that is not controlled by your nausea medication, call the clinic.   BELOW ARE SYMPTOMS THAT SHOULD BE REPORTED IMMEDIATELY:  *FEVER GREATER THAN 100.5 F  *CHILLS WITH OR WITHOUT FEVER  NAUSEA AND VOMITING THAT IS NOT CONTROLLED WITH YOUR NAUSEA MEDICATION  *UNUSUAL SHORTNESS OF BREATH  *UNUSUAL BRUISING OR BLEEDING  TENDERNESS IN MOUTH AND THROAT WITH OR WITHOUT PRESENCE OF ULCERS  *URINARY PROBLEMS  *BOWEL PROBLEMS  UNUSUAL RASH Items with * indicate a potential emergency and should be followed up as soon as possible.  Feel free to call the clinic should you have any questions or concerns. The clinic phone number is [phone].  Please show the CHEMO ALERT CARD at check-in to the Emergency Department and triage nurse.   

## 2017-08-26 ENCOUNTER — Encounter: Payer: Self-pay | Admitting: Family Medicine

## 2017-08-26 ENCOUNTER — Ambulatory Visit (INDEPENDENT_AMBULATORY_CARE_PROVIDER_SITE_OTHER): Payer: Medicaid Other | Admitting: Family Medicine

## 2017-08-26 VITALS — BP 127/70 | HR 86 | Wt 161.4 lb

## 2017-08-26 DIAGNOSIS — N949 Unspecified condition associated with female genital organs and menstrual cycle: Secondary | ICD-10-CM

## 2017-08-26 DIAGNOSIS — N921 Excessive and frequent menstruation with irregular cycle: Secondary | ICD-10-CM

## 2017-08-26 DIAGNOSIS — N920 Excessive and frequent menstruation with regular cycle: Secondary | ICD-10-CM | POA: Insufficient documentation

## 2017-08-26 DIAGNOSIS — N839 Noninflammatory disorder of ovary, fallopian tube and broad ligament, unspecified: Secondary | ICD-10-CM | POA: Diagnosis not present

## 2017-08-26 DIAGNOSIS — N838 Other noninflammatory disorders of ovary, fallopian tube and broad ligament: Secondary | ICD-10-CM | POA: Insufficient documentation

## 2017-08-26 DIAGNOSIS — Z1211 Encounter for screening for malignant neoplasm of colon: Secondary | ICD-10-CM

## 2017-08-26 NOTE — Assessment & Plan Note (Signed)
If further bleeding consider EMB--she would like to avoid. Not menopausal so thickness is ok. Would want hyst with ovarian removal. MRI may also help delineate what type of procedure she might need.

## 2017-08-26 NOTE — Assessment & Plan Note (Signed)
Unclear etiology--will check CA125 and MRI for further clarification. If GYN/Onc is needed will refer. Finishes chemo in mid January. Will return for discussion around removal.

## 2017-08-26 NOTE — Progress Notes (Signed)
   Subjective:    Patient ID: Erin [name] is a 56 y.o. female presenting with Ovarian Cyst and Metrorrhagia  on 08/26/2017  HPI: Had 2-3 wks of bleeding in September, prior to Chemo. In October bled for 1.5-2 wks. Cycles prior to September were monthly and lasted < 7 days. Got pelvic sono and noted to have cyst on left ovary. Endometrial thickness is 10 mm. No abdominal pain, fever, chills, changes in bowel habits.  Review of Systems  Constitutional: Negative for chills and fever.  Respiratory: Negative for shortness of breath.   Cardiovascular: Negative for chest pain.  Gastrointestinal: Negative for abdominal pain, nausea and vomiting.  Genitourinary: Negative for dysuria.  Skin: Negative for rash.      Objective:    BP 127/70   Pulse 86   Wt 161 lb 6.4 oz (73.2 kg)   LMP 06/09/2017   BMI 30.50 kg/m  Physical Exam  Constitutional: She is oriented to person, place, and time. She appears well-developed and well-nourished. No distress.  HENT:  Head: Normocephalic and atraumatic.  Eyes: No scleral icterus.  Neck: Neck supple.  Cardiovascular: Normal rate.  Pulmonary/Chest: Effort normal.  Abdominal: Soft.  Neurological: She is alert and oriented to person, place, and time.  Skin: Skin is warm and dry.  Psychiatric: She has a normal mood and affect.        Assessment & Plan:   Problem List Items Addressed This Visit      Unprioritized   Ovarian mass, left    Unclear etiology--will check CA125 and MRI for further clarification. If GYN/Onc is needed will refer. Finishes chemo in mid January. Will return for discussion around removal.       Relevant Orders   CA 125   Menorrhagia    If further bleeding consider EMB--she would like to avoid. Not menopausal so thickness is ok. Would want hyst with ovarian removal. MRI may also help delineate what type of procedure she might need.       Other Visit Diagnoses    Screen for colon cancer    -  Primary   Relevant  Orders   Ambulatory referral to Gastroenterology   Unspecified condition associated with female genital organs and menstrual cycle       Relevant Orders   MR PELVIS W WO CONTRAST      Total face-to-face time with patient: 20 minutes. Over 50% of encounter was spent on counseling and coordination of care. Return in about 4 weeks (around 09/23/2017).  [name] 08/26/2017 3:00 PM

## 2017-08-26 NOTE — Patient Instructions (Signed)
Pelvic Mass A pelvic mass is an abnormal growth in the pelvis. The pelvis is the area between your hip bones. It includes the bladder and the rectum in males and females, and also the uterus and ovaries in females. What are the causes? Many things can cause a pelvic mass, including:  Cancer.  Fibroids of the uterus.  Ovarian cysts.  Infection.  Ectopic pregnancy.  What are the signs or symptoms? Symptoms of a pelvic mass may include:  Cramping.  Nausea.  Diarrhea.  Fever.  Vomiting.  Weakness.  Pain in the pelvis, side, or back.  Weight loss.  Constipation.  Problems with vaginal bleeding, including: ? Light or heavy bleeding with or without blood clots. ? Irregular menstruation. ? Pain with menstruation.  Problems with urination, including: ? Frequent urination. ? Inability to empty the bladder completely. ? Urinating very small amounts. ? Pain with urination. ? Bloody urine.  Some pelvic masses do not cause symptoms. How is this diagnosed? To make a diagnosis, your health care provider will need to learn more about the mass. You may have tests or procedures done, such as:  Blood tests.  X-rays.  Ultrasound.  CT scan.  MRI.  A surgery to look inside of your abdomen with cameras (laparoscopy).  A biopsy that is performed with a needle or during laparoscopy or surgery.  In some cases, what seemed like a pelvic mass may actually be something else, such as a mass in one of the organs that are near the pelvis, an infection (abscess) or scar tissue (adhesions) that formed after a surgery. How is this treated? Treatment will depend on the cause of the mass. Follow these instructions at home: What you need to do at home will depend on the cause of the mass. Follow the instructions that your health care provider gives to you. In general:  Keep all follow-up visits as directed by your health care provider. This is important.  Take medicines only as  directed by your health care provider.  Follow any restrictions that are given to you by your health care provider.  Contact a health care provider if:  You develop new symptoms. Get help right away if:  You vomit bright red blood or vomit material that looks like coffee grounds.  You have blood in your stools, or the stools turn black and tarry.  You have an abnormal or increased amount of vaginal bleeding.  You have a fever.  You develop easy bruising or bleeding.  You develop sudden or worsening pain that is not controlled by your medicine.  You feel worsening weakness, or you have a fainting episode.  You feel that the mass has suddenly gotten larger.  You develop severe bloating in your abdomen or your pelvis.  You cannot pass any urine.  You are unable to have a bowel movement. This information is not intended to replace advice given to you by your health care provider. Make sure you discuss any questions you have with your health care provider. Document Released: 12/08/2006 Document Revised: 02/06/2016 Document Reviewed: 04/16/2014 Elsevier Interactive Patient Education  2018 Elsevier Inc.  

## 2017-08-27 ENCOUNTER — Ambulatory Visit: Payer: Medicaid Other | Attending: Family Medicine | Admitting: Family Medicine

## 2017-08-27 ENCOUNTER — Encounter: Payer: Self-pay | Admitting: Family Medicine

## 2017-08-27 ENCOUNTER — Other Ambulatory Visit: Payer: Self-pay

## 2017-08-27 VITALS — BP 129/80 | HR 97 | Temp 97.8°F | Resp 16 | Wt 165.4 lb

## 2017-08-27 DIAGNOSIS — N920 Excessive and frequent menstruation with regular cycle: Secondary | ICD-10-CM | POA: Insufficient documentation

## 2017-08-27 DIAGNOSIS — H9190 Unspecified hearing loss, unspecified ear: Secondary | ICD-10-CM | POA: Diagnosis not present

## 2017-08-27 DIAGNOSIS — Z79899 Other long term (current) drug therapy: Secondary | ICD-10-CM | POA: Insufficient documentation

## 2017-08-27 DIAGNOSIS — E785 Hyperlipidemia, unspecified: Secondary | ICD-10-CM | POA: Insufficient documentation

## 2017-08-27 DIAGNOSIS — E1169 Type 2 diabetes mellitus with other specified complication: Secondary | ICD-10-CM

## 2017-08-27 DIAGNOSIS — Z862 Personal history of diseases of the blood and blood-forming organs and certain disorders involving the immune mechanism: Secondary | ICD-10-CM | POA: Diagnosis not present

## 2017-08-27 DIAGNOSIS — D638 Anemia in other chronic diseases classified elsewhere: Secondary | ICD-10-CM | POA: Insufficient documentation

## 2017-08-27 DIAGNOSIS — Z853 Personal history of malignant neoplasm of breast: Secondary | ICD-10-CM | POA: Diagnosis not present

## 2017-08-27 DIAGNOSIS — Z7984 Long term (current) use of oral hypoglycemic drugs: Secondary | ICD-10-CM | POA: Diagnosis not present

## 2017-08-27 DIAGNOSIS — E118 Type 2 diabetes mellitus with unspecified complications: Secondary | ICD-10-CM | POA: Diagnosis not present

## 2017-08-27 LAB — POCT GLYCOSYLATED HEMOGLOBIN (HGB A1C): Hemoglobin A1C: 7.1

## 2017-08-27 LAB — CA 125: CANCER ANTIGEN (CA) 125: 10.3 U/mL (ref 0.0–38.1)

## 2017-08-27 LAB — GLUCOSE, POCT (MANUAL RESULT ENTRY): POC GLUCOSE: 236 mg/dL — AB (ref 70–99)

## 2017-08-27 MED ORDER — ATORVASTATIN CALCIUM 40 MG PO TABS: 40.0000 mg | ORAL_TABLET | Freq: Every day | ORAL | 1 refills | Status: DC

## 2017-08-27 MED ORDER — LISINOPRIL 2.5 MG PO TABS: 2.5000 mg | ORAL_TABLET | Freq: Every day | ORAL | 5 refills | Status: DC

## 2017-08-27 NOTE — Progress Notes (Signed)
Follow up OV today Went to [facility] on yesterday Was told to f/u  With MRI

## 2017-08-27 NOTE — Progress Notes (Signed)
Subjective:  Patient ID: Erin [name], female    DOB: 11/22/1960  Age: 56 y.o. MRN: [id number]  CC: Follow-up   HPI Erin [name] presents for history of diabetes. Patient has a history of deafness ASL interpreter present in room.  Symptoms: none. Patient denies foot ulcerations, nausea, paresthesia of the feet, visual disturbances and vomitting.  Evaluation to date has been included: fasting blood sugar and fasting lipid panel.  Home sugars: patient does not check sugars. Treatment to date: Metformin. Past medical history includes breast cancer. History of menorrhagia. Recent pelvic ultrasound 06/2017 that showed abnormal complex cystic ovarian structure she reports upcoming appointment with gynecology and MRI imaging.   Outpatient Medications Prior to Visit  Medication Sig Dispense Refill  . BIOTIN 5000 PO Take by mouth daily.    . Blood Glucose Monitoring Suppl (ACCU-CHEK AVIVA PLUS) W/DEVICE KIT Check Blood sugar AM fasting and before dinner 1 kit 0  . cholecalciferol (VITAMIN D) 1000 units tablet Take 1,000 Units by mouth daily.    Marland Kitchen glucose blood (ACCU-CHEK AVIVA) test strip Use as instructed 100 each 12  . Lancet Devices (ACCU-CHEK SOFTCLIX) lancets Use as instructed 1 each 0  . lidocaine-prilocaine (EMLA) cream Apply to affected area once 30 g 3  . metFORMIN (GLUCOPHAGE XR) 500 MG 24 hr tablet Take 2 tablets (1,000 mg total) by mouth daily with breakfast. 60 tablet 11  . Omega-3 Fatty Acids (FISH OIL OMEGA-3 PO) Take by mouth.    . TRUEPLUS LANCETS 28G MISC USE AS DIRECTED BY PHYSICIAN 100 each 0  . atorvastatin (LIPITOR) 40 MG tablet Take 1 tablet (40 mg total) by mouth daily. 90 tablet 3  . lisinopril (PRINIVIL,ZESTRIL) 2.5 MG tablet Take 1 tablet (2.5 mg total) by mouth daily. 30 tablet 2  . ferrous sulfate 325 (65 FE) MG tablet Take 1 tablet (325 mg total) by mouth daily with breakfast. 30 tablet 3  . ciclopirox (PENLAC) 8 % solution Apply topically at bedtime. Apply over  nail and surrounding skin. Apply daily over previous coat. After seven (7) days, may remove with alcohol and continue cycle. (Patient not taking: Reported on 08/27/2017) 6.6 mL 1   No facility-administered medications prior to visit.     ROS Review of Systems  Constitutional: Negative.   Eyes: Negative.   Respiratory: Negative.   Cardiovascular: Negative.   Gastrointestinal: Negative.   Skin: Negative.    Objective:  BP 129/80   Pulse 97   Temp 97.8 F (36.6 C) (Oral)   Resp 16   Wt 165 lb 6.4 oz (75 kg)   LMP 07/09/2017   SpO2 99%   BMI 31.25 kg/m   BP/Weight 08/27/2017 08/26/2017 54/65/6812  Systolic BP 751 700 174  Diastolic BP 80 70 75  Wt. (Lbs) 165.4 161.4 163.8  BMI 31.25 30.5 30.95   Physical Exam  Constitutional: She appears well-developed and well-nourished.  Cardiovascular: Normal rate, regular rhythm, normal heart sounds and intact distal pulses.  Pulmonary/Chest: Effort normal and breath sounds normal.  Abdominal: Soft. Bowel sounds are normal. There is no tenderness.  Skin: Skin is warm and dry.  Yellow discolored toenail to great right toe.  Psychiatric: She has a normal mood and affect.  Nursing note and vitals reviewed.  Assessment & Plan:   1. Type 2 diabetes mellitus with complication, without long-term current use of insulin (HCC)  - POCT glycosylated hemoglobin (Hb A1C) - atorvastatin (LIPITOR) 40 MG tablet; Take 1 tablet (40 mg total) by mouth  daily.  Dispense: 90 tablet; Refill: 1 - lisinopril (PRINIVIL,ZESTRIL) 2.5 MG tablet; Take 1 tablet (2.5 mg total) by mouth daily.  Dispense: 30 tablet; Refill: 5 - Glucose (CBG)  2. Hyperlipidemia associated with type 2 diabetes mellitus (HCC)   - atorvastatin (LIPITOR) 40 MG tablet; Take 1 tablet (40 mg total) by mouth daily.  Dispense: 90 tablet; Refill: 1  3. History of anemia of chronic disease     Follow-up: Return in about 3 months (around 11/25/2017) for DM/HTN.   [name]  FNP

## 2017-08-27 NOTE — Patient Instructions (Addendum)
Continue current diabetic medications.  Incorporate more dietary changes and avoid eating large amounts of starches, white bread, rice, and sugar. Diabetes Mellitus and Food It is important for you to manage your blood sugar (glucose) level. Your blood glucose level can be greatly affected by what you eat. Eating healthier foods in the appropriate amounts throughout the day at about the same time each day will help you control your blood glucose level. It can also help slow or prevent worsening of your diabetes mellitus. Healthy eating may even help you improve the level of your blood pressure and reach or maintain a healthy weight. General recommendations for healthful eating and cooking habits include:  Eating meals and snacks regularly. Avoid going long periods of time without eating to lose weight.  Eating a diet that consists mainly of plant-based foods, such as fruits, vegetables, nuts, legumes, and whole grains.  Using low-heat cooking methods, such as baking, instead of high-heat cooking methods, such as deep frying.  Work with your dietitian to make sure you understand how to use the Nutrition Facts information on food labels. How can food affect me? Carbohydrates Carbohydrates affect your blood glucose level more than any other type of food. Your dietitian will help you determine how many carbohydrates to eat at each meal and teach you how to count carbohydrates. Counting carbohydrates is important to keep your blood glucose at a healthy level, especially if you are using insulin or taking certain medicines for diabetes mellitus. Alcohol Alcohol can cause sudden decreases in blood glucose (hypoglycemia), especially if you use insulin or take certain medicines for diabetes mellitus. Hypoglycemia can be a life-threatening condition. Symptoms of hypoglycemia (sleepiness, dizziness, and disorientation) are similar to symptoms of having too much alcohol. If your health care provider has given  you approval to drink alcohol, do so in moderation and use the following guidelines:  Women should not have more than one drink per day, and men should not have more than two drinks per day. One drink is equal to: ? 12 oz of beer. ? 5 oz of wine. ? 1 oz of hard liquor.  Do not drink on an empty stomach.  Keep yourself hydrated. Have water, diet soda, or unsweetened iced tea.  Regular soda, juice, and other mixers might contain a lot of carbohydrates and should be counted.  What foods are not recommended? As you make food choices, it is important to remember that all foods are not the same. Some foods have fewer nutrients per serving than other foods, even though they might have the same number of calories or carbohydrates. It is difficult to get your body what it needs when you eat foods with fewer nutrients. Examples of foods that you should avoid that are high in calories and carbohydrates but low in nutrients include:  Trans fats (most processed foods list trans fats on the Nutrition Facts label).  Regular soda.  Juice.  Candy.  Sweets, such as cake, pie, doughnuts, and cookies.  Fried foods.  What foods can I eat? Eat nutrient-rich foods, which will nourish your body and keep you healthy. The food you should eat also will depend on several factors, including:  The calories you need.  The medicines you take.  Your weight.  Your blood glucose level.  Your blood pressure level.  Your cholesterol level.  You should eat a variety of foods, including:  Protein. ? Lean cuts of meat. ? Proteins low in saturated fats, such as fish, egg whites, and beans.  Avoid processed meats.  Fruits and vegetables. ? Fruits and vegetables that may help control blood glucose levels, such as apples, mangoes, and yams.  Dairy products. ? Choose fat-free or low-fat dairy products, such as milk, yogurt, and cheese.  Grains, bread, pasta, and rice. ? Choose whole grain products, such  as multigrain bread, whole oats, and brown rice. These foods may help control blood pressure.  Fats. ? Foods containing healthful fats, such as nuts, avocado, olive oil, canola oil, and fish.  Does everyone with diabetes mellitus have the same meal plan? Because every person with diabetes mellitus is different, there is not one meal plan that works for everyone. It is very important that you meet with a dietitian who will help you create a meal plan that is just right for you. This information is not intended to replace advice given to you by your health care provider. Make sure you discuss any questions you have with your health care provider. Document Released: 05/28/2005 Document Revised: 02/06/2016 Document Reviewed: 07/28/2013 Elsevier Interactive Patient Education  2017 [employer].

## 2017-08-31 NOTE — Assessment & Plan Note (Signed)
06/09/2017: Left lumpectomy: IDC grade 3, 1.2 cm, DCIS is present, margins negative, 0/6 lymph nodes negative, ER 0%, PR 0%, HER-2 positive ratio 2.28, Ki-67 20%, T1 BN 0 stage IA  Treatment plan: 1. Adjuvant chemotherapy with Taxol Herceptin weekly 12 followed by Herceptin every 3 weeks for total of 6 months 2. Followed by adjuvant radiation  Echo on 05/24/17 demonstrates LVEF of 60-65%  ____________________________________________________________________  Current treatment: Cycle 6 Taxol Herceptin Chemo toxicities: 1.  GERD: On PPI therapy 2. Allergic rhinitis: recommended Claritin and Flonase, saline nasal spray, if worsens, or if she develops symptoms consistent with infection, we will call in antibiotics.  Return to clinic weekly for Taxol and Herceptin and every 2 weeks for follow-up with me

## 2017-09-01 ENCOUNTER — Ambulatory Visit: Payer: Medicaid Other

## 2017-09-01 ENCOUNTER — Other Ambulatory Visit (HOSPITAL_BASED_OUTPATIENT_CLINIC_OR_DEPARTMENT_OTHER): Payer: Medicaid Other

## 2017-09-01 ENCOUNTER — Ambulatory Visit (HOSPITAL_BASED_OUTPATIENT_CLINIC_OR_DEPARTMENT_OTHER): Payer: Medicaid Other

## 2017-09-01 ENCOUNTER — Ambulatory Visit (HOSPITAL_BASED_OUTPATIENT_CLINIC_OR_DEPARTMENT_OTHER): Payer: Medicaid Other | Admitting: Hematology and Oncology

## 2017-09-01 VITALS — BP 139/71 | HR 98 | Temp 98.3°F | Resp 18 | Ht 61.0 in | Wt 162.5 lb

## 2017-09-01 DIAGNOSIS — Z5111 Encounter for antineoplastic chemotherapy: Secondary | ICD-10-CM | POA: Diagnosis not present

## 2017-09-01 DIAGNOSIS — Z171 Estrogen receptor negative status [ER-]: Principal | ICD-10-CM

## 2017-09-01 DIAGNOSIS — Z5112 Encounter for antineoplastic immunotherapy: Secondary | ICD-10-CM | POA: Diagnosis not present

## 2017-09-01 DIAGNOSIS — Z95828 Presence of other vascular implants and grafts: Secondary | ICD-10-CM

## 2017-09-01 DIAGNOSIS — C50512 Malignant neoplasm of lower-outer quadrant of left female breast: Secondary | ICD-10-CM | POA: Diagnosis not present

## 2017-09-01 DIAGNOSIS — D508 Other iron deficiency anemias: Secondary | ICD-10-CM

## 2017-09-01 LAB — COMPREHENSIVE METABOLIC PANEL
ALK PHOS: 76 U/L (ref 40–150)
ALT: 18 U/L (ref 0–55)
ANION GAP: 9 meq/L (ref 3–11)
AST: 12 U/L (ref 5–34)
Albumin: 3.2 g/dL — ABNORMAL LOW (ref 3.5–5.0)
BUN: 7.5 mg/dL (ref 7.0–26.0)
CALCIUM: 9 mg/dL (ref 8.4–10.4)
CHLORIDE: 106 meq/L (ref 98–109)
CO2: 23 mEq/L (ref 22–29)
CREATININE: 0.7 mg/dL (ref 0.6–1.1)
EGFR: >60 mL/min/{1.73_m2} (ref >60)
Glucose: 193 mg/dl — ABNORMAL HIGH (ref 70–140)
Potassium: 3.6 mEq/L (ref 3.5–5.1)
Sodium: 137 mEq/L (ref 136–145)
Total Bilirubin: 0.28 mg/dL (ref 0.20–1.20)
Total Protein: 7.1 g/dL (ref 6.4–8.3)

## 2017-09-01 LAB — CBC WITH DIFFERENTIAL/PLATELET
BASO%: 0.2 % (ref 0.0–2.0)
Basophils Absolute: 0 10*3/uL (ref 0.0–0.1)
EOS ABS: 0.1 10*3/uL (ref 0.0–0.5)
EOS%: 0.8 % (ref 0.0–7.0)
HCT: 28.2 % — ABNORMAL LOW (ref 34.8–46.6)
HEMOGLOBIN: 8.9 g/dL — AB (ref 11.6–15.9)
LYMPH%: 24.8 % (ref 14.0–49.7)
MCH: 26.7 pg (ref 25.1–34.0)
MCHC: 31.6 g/dL (ref 31.5–36.0)
MCV: 84.7 fL (ref 79.5–101.0)
MONO#: 0.7 10*3/uL (ref 0.1–0.9)
MONO%: 7.5 % (ref 0.0–14.0)
NEUT%: 66.7 % (ref 38.4–76.8)
NEUTROS ABS: 6.4 10*3/uL (ref 1.5–6.5)
Platelets: 312 10*3/uL (ref 145–400)
RBC: 3.33 10*6/uL — ABNORMAL LOW (ref 3.70–5.45)
RDW: 18.1 % — AB (ref 11.2–14.5)
WBC: 9.6 10*3/uL (ref 3.9–10.3)
lymph#: 2.4 10*3/uL (ref 0.9–3.3)

## 2017-09-01 MED ORDER — SODIUM CHLORIDE 0.9 % IV SOLN
Freq: Once | INTRAVENOUS | Status: AC
  Administered 2017-09-01: 09:00:00 via INTRAVENOUS

## 2017-09-01 MED ORDER — SODIUM CHLORIDE 0.9% FLUSH
10.0000 mL | INTRAVENOUS | Status: DC | PRN
  Administered 2017-09-01: 10 mL via INTRAVENOUS
  Filled 2017-09-01: qty 10

## 2017-09-01 MED ORDER — DIPHENHYDRAMINE HCL 50 MG/ML IJ SOLN
25.0000 mg | Freq: Once | INTRAMUSCULAR | Status: AC
  Administered 2017-09-01: 25 mg via INTRAVENOUS

## 2017-09-01 MED ORDER — DEXAMETHASONE SODIUM PHOSPHATE 10 MG/ML IJ SOLN
INTRAMUSCULAR | Status: AC
  Filled 2017-09-01: qty 1

## 2017-09-01 MED ORDER — SODIUM CHLORIDE 0.9 % IV SOLN
80.0000 mg/m2 | Freq: Once | INTRAVENOUS | Status: AC
  Administered 2017-09-01: 144 mg via INTRAVENOUS
  Filled 2017-09-01: qty 24

## 2017-09-01 MED ORDER — ACETAMINOPHEN 325 MG PO TABS
ORAL_TABLET | ORAL | Status: AC
  Filled 2017-09-01: qty 2

## 2017-09-01 MED ORDER — SODIUM CHLORIDE 0.9% FLUSH
10.0000 mL | INTRAVENOUS | Status: DC | PRN
Start: 2017-09-01 — End: 2017-09-01
  Administered 2017-09-01: 10 mL
  Filled 2017-09-01: qty 10

## 2017-09-01 MED ORDER — DIPHENHYDRAMINE HCL 50 MG/ML IJ SOLN
INTRAMUSCULAR | Status: AC
  Filled 2017-09-01: qty 1

## 2017-09-01 MED ORDER — DEXAMETHASONE SODIUM PHOSPHATE 10 MG/ML IJ SOLN
10.0000 mg | Freq: Once | INTRAMUSCULAR | Status: AC
  Administered 2017-09-01: 10 mg via INTRAVENOUS

## 2017-09-01 MED ORDER — ACETAMINOPHEN 325 MG PO TABS
650.0000 mg | ORAL_TABLET | Freq: Once | ORAL | Status: AC
  Administered 2017-09-01: 650 mg via ORAL

## 2017-09-01 MED ORDER — FAMOTIDINE IN NACL 20-0.9 MG/50ML-% IV SOLN
INTRAVENOUS | Status: AC
  Filled 2017-09-01: qty 50

## 2017-09-01 MED ORDER — HEPARIN SOD (PORK) LOCK FLUSH 100 UNIT/ML IV SOLN
500.0000 [IU] | Freq: Once | INTRAVENOUS | Status: AC | PRN
  Administered 2017-09-01: 500 [IU]
  Filled 2017-09-01: qty 5

## 2017-09-01 MED ORDER — FAMOTIDINE IN NACL 20-0.9 MG/50ML-% IV SOLN
20.0000 mg | Freq: Once | INTRAVENOUS | Status: AC
Start: 2017-09-01 — End: 2017-09-01
  Administered 2017-09-01: 20 mg via INTRAVENOUS

## 2017-09-01 MED ORDER — TRASTUZUMAB CHEMO 150 MG IV SOLR
150.0000 mg | Freq: Once | INTRAVENOUS | Status: AC
  Administered 2017-09-01: 150 mg via INTRAVENOUS
  Filled 2017-09-01: qty 7.14

## 2017-09-01 NOTE — Patient Instructions (Signed)
[facility] Discharge Instructions for Patients Receiving Chemotherapy  Today you received the following chemotherapy agents Taxol and Herceptin  To help prevent nausea and vomiting after your treatment, we encourage you to take your nausea medication as directed   If you develop nausea and vomiting that is not controlled by your nausea medication, call the clinic.   BELOW ARE SYMPTOMS THAT SHOULD BE REPORTED IMMEDIATELY:  *FEVER GREATER THAN 100.5 F  *CHILLS WITH OR WITHOUT FEVER  NAUSEA AND VOMITING THAT IS NOT CONTROLLED WITH YOUR NAUSEA MEDICATION  *UNUSUAL SHORTNESS OF BREATH  *UNUSUAL BRUISING OR BLEEDING  TENDERNESS IN MOUTH AND THROAT WITH OR WITHOUT PRESENCE OF ULCERS  *URINARY PROBLEMS  *BOWEL PROBLEMS  UNUSUAL RASH Items with * indicate a potential emergency and should be followed up as soon as possible.  Feel free to call the clinic should you have any questions or concerns. The clinic phone number is [phone].  Please show the CHEMO ALERT CARD at check-in to the Emergency Department and triage nurse.   

## 2017-09-01 NOTE — Progress Notes (Signed)
Patient Care Team: [name], FNP as PCP - General (Family Medicine) Excell Seltzer, MD as Consulting Physician (General Surgery) [name], MD as Consulting Physician (Hematology and Oncology) Kyung Rudd, MD as Consulting Physician (Radiation Oncology)  DIAGNOSIS:  Encounter Diagnoses  Name Primary?  . Malignant neoplasm of lower-outer quadrant of left breast of female, estrogen receptor negative (Erin [name])   . Other iron deficiency anemia Yes    SUMMARY OF ONCOLOGIC HISTORY:   Malignant neoplasm of lower-outer quadrant of left breast of female, estrogen receptor negative (Erin [name])   05/11/2017 Initial Diagnosis    Left breast biopsy 3:30 position 6 cm from nipple: IDC with DCIS, lymphovascular invasion present, grade 2-3, ER 0%, PR 0%, HER-2 positive ratio 2.28, Ki-67 20%, 1 cm lesion left breast, T1b N0 stage IA clinical stage      06/09/2017 Surgery    Left lumpectomy: IDC grade 3, 1.2 cm, DCIS is present, margins negative, 0/6 lymph nodes negative, ER 0%, PR 0%, HER-2 positive ratio 2.28, Ki-67 20%, T1 BN 0 stage IA      07/09/2017 -  Adjuvant Chemotherapy    Taxol/Herceptin       CHIEF COMPLIANT: Cycle 8 Taxol Herceptin  INTERVAL HISTORY: Erin [name] is a 56 year old with above-mentioned history left breast cancer who is currently on adjuvant chemotherapy with Taxol Herceptin.  Today's cycle 8.  Overall she has been tolerating extremely well.  She has noticed some bumps on the scalp related to hair follicle inflammation.  She also has noticed some occasional nosebleeds as well.  Denies any nausea or vomiting.  She has epigastric discomfort from acid reflux.  She has been anemic from the beginning of the treatment even prior to starting treatment.  She has been taking oral iron but that has not changed her hemoglobin significantly.  She denies any bleeding symptoms.  Completely denies neuropathy.  REVIEW OF SYSTEMS:   Constitutional: Denies fevers, chills or  abnormal weight loss Eyes: Denies blurriness of vision Ears, nose, mouth, throat, and face: Denies mucositis or sore throat Respiratory: Denies cough, dyspnea or wheezes Cardiovascular: Denies palpitation, chest discomfort Gastrointestinal:  Denies nausea, heartburn or change in bowel habits Skin: Denies abnormal skin rashes Lymphatics: Denies new lymphadenopathy or easy bruising Neurological:Denies numbness, tingling or new weaknesses Behavioral/Psych: Mood is stable, no new changes  Extremities: No lower extremity edema  All other systems were reviewed with the patient and are negative.  I have reviewed the past medical history, past surgical history, social history and family history with the patient and they are unchanged from previous note.  ALLERGIES:  has No Known Allergies.  MEDICATIONS:  Current Outpatient Medications  Medication Sig Dispense Refill  . atorvastatin (LIPITOR) 40 MG tablet Take 1 tablet (40 mg total) by mouth daily. 90 tablet 1  . BIOTIN 5000 PO Take by mouth daily.    . Blood Glucose Monitoring Suppl (ACCU-CHEK AVIVA PLUS) W/DEVICE KIT Check Blood sugar AM fasting and before dinner 1 kit 0  . cholecalciferol (VITAMIN D) 1000 units tablet Take 1,000 Units by mouth daily.    . ferrous sulfate 325 (65 FE) MG tablet Take 1 tablet (325 mg total) by mouth daily with breakfast. 30 tablet 3  . glucose blood (ACCU-CHEK AVIVA) test strip Use as instructed 100 each 12  . Lancet Devices (ACCU-CHEK SOFTCLIX) lancets Use as instructed 1 each 0  . lidocaine-prilocaine (EMLA) cream Apply to affected area once 30 g 3  . lisinopril (PRINIVIL,ZESTRIL) 2.5 MG tablet Take  1 tablet (2.5 mg total) by mouth daily. 30 tablet 5  . metFORMIN (GLUCOPHAGE XR) 500 MG 24 hr tablet Take 2 tablets (1,000 mg total) by mouth daily with breakfast. 60 tablet 11  . Omega-3 Fatty Acids (FISH OIL OMEGA-3 PO) Take by mouth.    . TRUEPLUS LANCETS 28G MISC USE AS DIRECTED BY PHYSICIAN 100 each 0   No  current facility-administered medications for this visit.     PHYSICAL EXAMINATION: ECOG PERFORMANCE STATUS: 1 - Symptomatic but completely ambulatory  Vitals:   09/01/17 0823  BP: 139/71  Pulse: 98  Resp: 18  Temp: 98.3 F (36.8 C)  SpO2: 100%   Filed Weights   09/01/17 0823  Weight: 162 lb 8 oz (73.7 kg)    GENERAL:alert, no distress and comfortable SKIN: skin color, texture, turgor are normal, no rashes or significant lesions EYES: normal, Conjunctiva are pink and non-injected, sclera clear OROPHARYNX:no exudate, no erythema and lips, buccal mucosa, and tongue normal  NECK: supple, thyroid normal size, non-tender, without nodularity LYMPH:  no palpable lymphadenopathy in the cervical, axillary or inguinal LUNGS: clear to auscultation and percussion with normal breathing effort HEART: regular rate & rhythm and no murmurs and no lower extremity edema ABDOMEN:abdomen soft, non-tender and normal bowel sounds MUSCULOSKELETAL:no cyanosis of digits and no clubbing  NEURO: alert & oriented x 3 with fluent speech, no focal motor/sensory deficits EXTREMITIES: No lower extremity edema   LABORATORY DATA:  I have reviewed the data as listed   Chemistry      Component Value Date/Time   NA 136 08/25/2017 0845   K 3.7 08/25/2017 0845   CL 103 04/02/2017 1208   CO2 24 08/25/2017 0845   BUN 9.8 08/25/2017 0845   CREATININE 0.8 08/25/2017 0845      Component Value Date/Time   CALCIUM 9.3 08/25/2017 0845   ALKPHOS 74 08/25/2017 0845   AST 15 08/25/2017 0845   ALT 21 08/25/2017 0845   BILITOT 0.26 08/25/2017 0845       Lab Results  Component Value Date   WBC 9.6 09/01/2017   HGB 8.9 (L) 09/01/2017   HCT 28.2 (L) 09/01/2017   MCV 84.7 09/01/2017   PLT 312 09/01/2017   NEUTROABS 6.4 09/01/2017    ASSESSMENT & PLAN:  Malignant neoplasm of lower-outer quadrant of left breast of female, estrogen receptor negative (Erin [name]) 06/09/2017: Left lumpectomy: IDC grade 3, 1.2 cm,  DCIS is present, margins negative, 0/6 lymph nodes negative, ER 0%, PR 0%, HER-2 positive ratio 2.28, Ki-67 20%, T1 BN 0 stage IA  Treatment plan: 1. Adjuvant chemotherapy with Taxol Herceptin weekly 12 followed by Herceptin every 3 weeks for total of 6 months 2. Followed by adjuvant radiation  Echo on 05/24/17 demonstrates LVEF of 60-65%  ____________________________________________________________________  Current treatment: Cycle 8 Taxol Herceptin Chemo toxicities: 1.  GERD: On PPI therapy 2. Allergic rhinitis: recommended Claritin and Flonase, saline nasal spray, if worsens, or if she develops symptoms consistent with infection, we will call in antibiotics.  Severe anemia: She was always anemic from the beginning.  She received blood transfusion earlier.  We will check iron studies for the next chemotherapy.  I instructed her not to take oral iron therapy prior to next chemo. If she is profoundly iron deficient then we will plan on administering intravenous iron therapy.  Return to clinic weekly for Taxol and Herceptin and every 2 weeks for follow-up with me   I spent 25 minutes talking to the patient of which  more than half was spent in counseling and coordination of care.  Orders Placed This Encounter  Procedures  . Iron and TIBC    Standing Status:   Future    Standing Expiration Date:   09/01/2018  . Ferritin    Standing Status:   Future    Standing Expiration Date:   09/01/2018   The patient has a good understanding of the overall plan. she agrees with it. she will call with any problems that may develop before the next visit here.   Rulon Eisenmenger, MD 09/01/17

## 2017-09-06 ENCOUNTER — Telehealth: Payer: Self-pay | Admitting: Hematology and Oncology

## 2017-09-06 ENCOUNTER — Ambulatory Visit (HOSPITAL_COMMUNITY)
Admission: RE | Admit: 2017-09-06 | Discharge: 2017-09-06 | Disposition: A | Discharge status: Home / Self Care | Payer: Medicaid Other | Source: Ambulatory Visit | Attending: Family Medicine | Admitting: Family Medicine

## 2017-09-06 DIAGNOSIS — N83202 Unspecified ovarian cyst, left side: Secondary | ICD-10-CM | POA: Insufficient documentation

## 2017-09-06 DIAGNOSIS — N949 Unspecified condition associated with female genital organs and menstrual cycle: Secondary | ICD-10-CM | POA: Diagnosis not present

## 2017-09-06 MED ORDER — GADOBENATE DIMEGLUMINE 529 MG/ML IV SOLN
15.0000 mL | Freq: Once | INTRAVENOUS | Status: AC | PRN
Start: 2017-09-06 — End: 2017-09-06
  Administered 2017-09-06: 15 mL via INTRAVENOUS

## 2017-09-06 NOTE — Telephone Encounter (Signed)
No 12/19 los. °

## 2017-09-07 ENCOUNTER — Other Ambulatory Visit: Payer: Self-pay | Admitting: Physician Assistant

## 2017-09-08 ENCOUNTER — Inpatient Hospital Stay (HOSPITAL_BASED_OUTPATIENT_CLINIC_OR_DEPARTMENT_OTHER): Payer: Medicaid Other

## 2017-09-08 ENCOUNTER — Encounter: Payer: Self-pay | Admitting: Nurse Practitioner

## 2017-09-08 ENCOUNTER — Telehealth: Payer: Self-pay | Admitting: *Deleted

## 2017-09-08 ENCOUNTER — Ambulatory Visit (HOSPITAL_BASED_OUTPATIENT_CLINIC_OR_DEPARTMENT_OTHER): Payer: Medicaid Other

## 2017-09-08 VITALS — BP 127/65 | HR 87 | Temp 98.0°F | Resp 18 | Wt 158.0 lb

## 2017-09-08 DIAGNOSIS — C50512 Malignant neoplasm of lower-outer quadrant of left female breast: Secondary | ICD-10-CM | POA: Diagnosis not present

## 2017-09-08 DIAGNOSIS — Z5112 Encounter for antineoplastic immunotherapy: Secondary | ICD-10-CM

## 2017-09-08 DIAGNOSIS — Z5111 Encounter for antineoplastic chemotherapy: Secondary | ICD-10-CM

## 2017-09-08 DIAGNOSIS — Z171 Estrogen receptor negative status [ER-]: Principal | ICD-10-CM

## 2017-09-08 DIAGNOSIS — D508 Other iron deficiency anemias: Secondary | ICD-10-CM

## 2017-09-08 LAB — CBC WITH DIFFERENTIAL/PLATELET
BASO%: 0.3 % (ref 0.0–2.0)
BASOS ABS: 0 10*3/uL (ref 0.0–0.1)
EOS%: 1.1 % (ref 0.0–7.0)
Eosinophils Absolute: 0.1 10*3/uL (ref 0.0–0.5)
HEMATOCRIT: 30.4 % — AB (ref 34.8–46.6)
HGB: 9.4 g/dL — ABNORMAL LOW (ref 11.6–15.9)
LYMPH%: 35.7 % (ref 14.0–49.7)
MCH: 26.3 pg (ref 25.1–34.0)
MCHC: 30.9 g/dL — AB (ref 31.5–36.0)
MCV: 85.2 fL (ref 79.5–101.0)
MONO#: 0.7 10*3/uL (ref 0.1–0.9)
MONO%: 8.9 % (ref 0.0–14.0)
NEUT#: 4 10*3/uL (ref 1.5–6.5)
NEUT%: 54 % (ref 38.4–76.8)
Platelets: 300 10*3/uL (ref 145–400)
RBC: 3.57 10*6/uL — AB (ref 3.70–5.45)
RDW: 18.9 % — ABNORMAL HIGH (ref 11.2–14.5)
WBC: 7.3 10*3/uL (ref 3.9–10.3)
lymph#: 2.6 10*3/uL (ref 0.9–3.3)

## 2017-09-08 LAB — COMPREHENSIVE METABOLIC PANEL
ALT: 15 U/L (ref 0–55)
AST: 13 U/L (ref 5–34)
Albumin: 3.5 g/dL (ref 3.5–5.0)
Alkaline Phosphatase: 80 U/L (ref 40–150)
Anion Gap: 11 mEq/L (ref 3–11)
BUN: 6.1 mg/dL — AB (ref 7.0–26.0)
CALCIUM: 9.3 mg/dL (ref 8.4–10.4)
CHLORIDE: 105 meq/L (ref 98–109)
CO2: 23 meq/L (ref 22–29)
Creatinine: 0.7 mg/dL (ref 0.6–1.1)
EGFR: >60 mL/min/{1.73_m2} (ref >60)
Glucose: 110 mg/dl (ref 70–140)
POTASSIUM: 4 meq/L (ref 3.5–5.1)
SODIUM: 139 meq/L (ref 136–145)
Total Bilirubin: 0.28 mg/dL (ref 0.20–1.20)
Total Protein: 7.3 g/dL (ref 6.4–8.3)

## 2017-09-08 LAB — IRON AND TIBC
%SAT: 8 % — AB (ref 21–57)
IRON: 25 ug/dL — AB (ref 41–142)
TIBC: 300 ug/dL (ref 236–444)
UIBC: 275 ug/dL (ref 120–384)

## 2017-09-08 LAB — FERRITIN: FERRITIN: 35 ng/mL (ref 9–269)

## 2017-09-08 MED ORDER — HEPARIN SOD (PORK) LOCK FLUSH 100 UNIT/ML IV SOLN
500.0000 [IU] | Freq: Once | INTRAVENOUS | Status: AC | PRN
  Administered 2017-09-08: 500 [IU]
  Filled 2017-09-08: qty 5

## 2017-09-08 MED ORDER — ACETAMINOPHEN 325 MG PO TABS
ORAL_TABLET | ORAL | Status: AC
  Filled 2017-09-08: qty 2

## 2017-09-08 MED ORDER — DEXAMETHASONE SODIUM PHOSPHATE 10 MG/ML IJ SOLN: 10.0000 mg | Freq: Once | INTRAMUSCULAR | Status: DC

## 2017-09-08 MED ORDER — ACETAMINOPHEN 325 MG PO TABS
650.0000 mg | ORAL_TABLET | Freq: Once | ORAL | Status: AC
  Administered 2017-09-08: 650 mg via ORAL

## 2017-09-08 MED ORDER — SODIUM CHLORIDE 0.9% FLUSH
10.0000 mL | INTRAVENOUS | Status: DC | PRN
  Administered 2017-09-08: 10 mL
  Filled 2017-09-08: qty 10

## 2017-09-08 MED ORDER — SODIUM CHLORIDE 0.9 % IV SOLN
10.0000 mg | Freq: Once | INTRAVENOUS | Status: AC
  Administered 2017-09-08: 10 mg via INTRAVENOUS
  Filled 2017-09-08: qty 1

## 2017-09-08 MED ORDER — DIPHENHYDRAMINE HCL 50 MG/ML IJ SOLN
25.0000 mg | Freq: Once | INTRAMUSCULAR | Status: AC
  Administered 2017-09-08: 25 mg via INTRAVENOUS

## 2017-09-08 MED ORDER — TRASTUZUMAB CHEMO 150 MG IV SOLR
150.0000 mg | Freq: Once | INTRAVENOUS | Status: AC
  Administered 2017-09-08: 150 mg via INTRAVENOUS
  Filled 2017-09-08: qty 7.14

## 2017-09-08 MED ORDER — DIPHENHYDRAMINE HCL 50 MG/ML IJ SOLN
INTRAMUSCULAR | Status: AC
  Filled 2017-09-08: qty 1

## 2017-09-08 MED ORDER — FAMOTIDINE IN NACL 20-0.9 MG/50ML-% IV SOLN
INTRAVENOUS | Status: AC
  Filled 2017-09-08: qty 50

## 2017-09-08 MED ORDER — SODIUM CHLORIDE 0.9 % IV SOLN
Freq: Once | INTRAVENOUS | Status: AC
  Administered 2017-09-08: 10:00:00 via INTRAVENOUS

## 2017-09-08 MED ORDER — FAMOTIDINE IN NACL 20-0.9 MG/50ML-% IV SOLN
20.0000 mg | Freq: Once | INTRAVENOUS | Status: AC
  Administered 2017-09-08: 20 mg via INTRAVENOUS

## 2017-09-08 MED ORDER — SODIUM CHLORIDE 0.9 % IV SOLN: 10.0000 mg | Freq: Once | INTRAVENOUS | Status: DC

## 2017-09-08 MED ORDER — SODIUM CHLORIDE 0.9 % IV SOLN
80.0000 mg/m2 | Freq: Once | INTRAVENOUS | Status: AC
  Administered 2017-09-08: 144 mg via INTRAVENOUS
  Filled 2017-09-08: qty 24

## 2017-09-08 NOTE — Telephone Encounter (Signed)
Received a call from radiology re: results of recent MRI of pelvis. Results are in Epic but protocol to notify MD that results show large bilobed cyst originating from left ovary by correlation with recent US cyst has no worrisome features however its large size warrents evaluation Re: gyn surgical evaluation.   Will forward to Dr. Kennon Rounds

## 2017-09-08 NOTE — Patient Instructions (Signed)
[facility] Discharge Instructions for Patients Receiving Chemotherapy  Today you received the following chemotherapy agents Taxol and Herceptin  To help prevent nausea and vomiting after your treatment, we encourage you to take your nausea medication as directed   If you develop nausea and vomiting that is not controlled by your nausea medication, call the clinic.   BELOW ARE SYMPTOMS THAT SHOULD BE REPORTED IMMEDIATELY:  *FEVER GREATER THAN 100.5 F  *CHILLS WITH OR WITHOUT FEVER  NAUSEA AND VOMITING THAT IS NOT CONTROLLED WITH YOUR NAUSEA MEDICATION  *UNUSUAL SHORTNESS OF BREATH  *UNUSUAL BRUISING OR BLEEDING  TENDERNESS IN MOUTH AND THROAT WITH OR WITHOUT PRESENCE OF ULCERS  *URINARY PROBLEMS  *BOWEL PROBLEMS  UNUSUAL RASH Items with * indicate a potential emergency and should be followed up as soon as possible.  Feel free to call the clinic should you have any questions or concerns. The clinic phone number is [phone].  Please show the CHEMO ALERT CARD at check-in to the Emergency Department and triage nurse.   

## 2017-09-15 ENCOUNTER — Telehealth: Payer: Self-pay | Admitting: Hematology and Oncology

## 2017-09-15 ENCOUNTER — Ambulatory Visit: Payer: Medicare Other

## 2017-09-15 ENCOUNTER — Other Ambulatory Visit (HOSPITAL_BASED_OUTPATIENT_CLINIC_OR_DEPARTMENT_OTHER): Payer: Medicaid Other

## 2017-09-15 ENCOUNTER — Ambulatory Visit (HOSPITAL_BASED_OUTPATIENT_CLINIC_OR_DEPARTMENT_OTHER): Payer: Medicaid Other

## 2017-09-15 ENCOUNTER — Encounter: Payer: Self-pay | Admitting: Adult Health

## 2017-09-15 ENCOUNTER — Ambulatory Visit (HOSPITAL_BASED_OUTPATIENT_CLINIC_OR_DEPARTMENT_OTHER): Payer: Medicaid Other | Admitting: Adult Health

## 2017-09-15 VITALS — BP 137/73 | HR 99 | Temp 98.4°F | Resp 18 | Ht 61.0 in | Wt 159.9 lb

## 2017-09-15 DIAGNOSIS — C50512 Malignant neoplasm of lower-outer quadrant of left female breast: Secondary | ICD-10-CM

## 2017-09-15 DIAGNOSIS — Z5112 Encounter for antineoplastic immunotherapy: Secondary | ICD-10-CM

## 2017-09-15 DIAGNOSIS — Z171 Estrogen receptor negative status [ER-]: Secondary | ICD-10-CM | POA: Diagnosis not present

## 2017-09-15 DIAGNOSIS — L739 Follicular disorder, unspecified: Secondary | ICD-10-CM

## 2017-09-15 DIAGNOSIS — Z5111 Encounter for antineoplastic chemotherapy: Secondary | ICD-10-CM | POA: Diagnosis not present

## 2017-09-15 DIAGNOSIS — Z95828 Presence of other vascular implants and grafts: Secondary | ICD-10-CM

## 2017-09-15 LAB — COMPREHENSIVE METABOLIC PANEL
ALBUMIN: 3.3 g/dL — AB (ref 3.5–5.0)
ALK PHOS: 74 U/L (ref 40–150)
ALT: 19 U/L (ref 0–55)
AST: 13 U/L (ref 5–34)
Anion Gap: 10 mEq/L (ref 3–11)
BUN: 8.7 mg/dL (ref 7.0–26.0)
CALCIUM: 9.2 mg/dL (ref 8.4–10.4)
CO2: 23 mEq/L (ref 22–29)
CREATININE: 0.7 mg/dL (ref 0.6–1.1)
Chloride: 106 mEq/L (ref 98–109)
EGFR: >60 mL/min/{1.73_m2} (ref >60)
GLUCOSE: 113 mg/dL (ref 70–140)
POTASSIUM: 3.8 meq/L (ref 3.5–5.1)
Sodium: 139 mEq/L (ref 136–145)
TOTAL PROTEIN: 7.3 g/dL (ref 6.4–8.3)
Total Bilirubin: 0.27 mg/dL (ref 0.20–1.20)

## 2017-09-15 LAB — CBC WITH DIFFERENTIAL/PLATELET
BASO%: 0.3 % (ref 0.0–2.0)
Basophils Absolute: 0 10*3/uL (ref 0.0–0.1)
EOS%: 0.9 % (ref 0.0–7.0)
Eosinophils Absolute: 0.1 10*3/uL (ref 0.0–0.5)
HEMATOCRIT: 28.2 % — AB (ref 34.8–46.6)
HGB: 9.3 g/dL — ABNORMAL LOW (ref 11.6–15.9)
LYMPH#: 2.5 10*3/uL (ref 0.9–3.3)
LYMPH%: 27.9 % (ref 14.0–49.7)
MCH: 27.1 pg (ref 25.1–34.0)
MCHC: 32.9 g/dL (ref 31.5–36.0)
MCV: 82.4 fL (ref 79.5–101.0)
MONO#: 0.8 10*3/uL (ref 0.1–0.9)
MONO%: 8.5 % (ref 0.0–14.0)
NEUT%: 62.4 % (ref 38.4–76.8)
NEUTROS ABS: 5.5 10*3/uL (ref 1.5–6.5)
Platelets: 357 10*3/uL (ref 145–400)
RBC: 3.42 10*6/uL — ABNORMAL LOW (ref 3.70–5.45)
RDW: 20.8 % — ABNORMAL HIGH (ref 11.2–14.5)
WBC: 8.9 10*3/uL (ref 3.9–10.3)

## 2017-09-15 MED ORDER — FAMOTIDINE IN NACL 20-0.9 MG/50ML-% IV SOLN
20.0000 mg | Freq: Once | INTRAVENOUS | Status: AC
  Administered 2017-09-15: 20 mg via INTRAVENOUS

## 2017-09-15 MED ORDER — TRASTUZUMAB CHEMO 150 MG IV SOLR
150.0000 mg | Freq: Once | INTRAVENOUS | Status: AC
  Administered 2017-09-15: 150 mg via INTRAVENOUS
  Filled 2017-09-15: qty 7.14

## 2017-09-15 MED ORDER — SODIUM CHLORIDE 0.9% FLUSH
10.0000 mL | INTRAVENOUS | Status: DC | PRN
  Administered 2017-09-15: 10 mL via INTRAVENOUS
  Filled 2017-09-15: qty 10

## 2017-09-15 MED ORDER — DIPHENHYDRAMINE HCL 50 MG/ML IJ SOLN
INTRAMUSCULAR | Status: AC
  Filled 2017-09-15: qty 1

## 2017-09-15 MED ORDER — DOXYCYCLINE HYCLATE 100 MG PO TABS: 100.0000 mg | ORAL_TABLET | Freq: Two times a day (BID) | ORAL | 0 refills | Status: DC

## 2017-09-15 MED ORDER — FAMOTIDINE IN NACL 20-0.9 MG/50ML-% IV SOLN
INTRAVENOUS | Status: AC
  Filled 2017-09-15: qty 50

## 2017-09-15 MED ORDER — HEPARIN SOD (PORK) LOCK FLUSH 100 UNIT/ML IV SOLN
500.0000 [IU] | Freq: Once | INTRAVENOUS | Status: AC | PRN
  Administered 2017-09-15: 500 [IU]
  Filled 2017-09-15: qty 5

## 2017-09-15 MED ORDER — ACETAMINOPHEN 325 MG PO TABS
650.0000 mg | ORAL_TABLET | Freq: Once | ORAL | Status: AC
  Administered 2017-09-15: 650 mg via ORAL

## 2017-09-15 MED ORDER — DIPHENHYDRAMINE HCL 50 MG/ML IJ SOLN
25.0000 mg | Freq: Once | INTRAMUSCULAR | Status: AC
  Administered 2017-09-15: 25 mg via INTRAVENOUS

## 2017-09-15 MED ORDER — SODIUM CHLORIDE 0.9 % IV SOLN
80.0000 mg/m2 | Freq: Once | INTRAVENOUS | Status: AC
  Administered 2017-09-15: 144 mg via INTRAVENOUS
  Filled 2017-09-15: qty 24

## 2017-09-15 MED ORDER — SODIUM CHLORIDE 0.9 % IV SOLN
10.0000 mg | Freq: Once | INTRAVENOUS | Status: AC
  Administered 2017-09-15: 10 mg via INTRAVENOUS
  Filled 2017-09-15: qty 1

## 2017-09-15 MED ORDER — ACETAMINOPHEN 325 MG PO TABS
ORAL_TABLET | ORAL | Status: AC
  Filled 2017-09-15: qty 2

## 2017-09-15 MED ORDER — SODIUM CHLORIDE 0.9 % IV SOLN
Freq: Once | INTRAVENOUS | Status: AC
  Administered 2017-09-15: 10:00:00 via INTRAVENOUS

## 2017-09-15 MED ORDER — DEXAMETHASONE SODIUM PHOSPHATE 10 MG/ML IJ SOLN
INTRAMUSCULAR | Status: AC
  Filled 2017-09-15: qty 1

## 2017-09-15 MED ORDER — SODIUM CHLORIDE 0.9% FLUSH
10.0000 mL | INTRAVENOUS | Status: DC | PRN
  Administered 2017-09-15: 10 mL
  Filled 2017-09-15: qty 10

## 2017-09-15 NOTE — Telephone Encounter (Signed)
Gave patient AVS and calendar of upcoming [place] appointments.

## 2017-09-15 NOTE — Patient Instructions (Signed)
[facility] Discharge Instructions for Patients Receiving Chemotherapy  Today you received the following chemotherapy agents Taxol and Herceptin  To help prevent nausea and vomiting after your treatment, we encourage you to take your nausea medication as directed   If you develop nausea and vomiting that is not controlled by your nausea medication, call the clinic.   BELOW ARE SYMPTOMS THAT SHOULD BE REPORTED IMMEDIATELY:  *FEVER GREATER THAN 100.5 F  *CHILLS WITH OR WITHOUT FEVER  NAUSEA AND VOMITING THAT IS NOT CONTROLLED WITH YOUR NAUSEA MEDICATION  *UNUSUAL SHORTNESS OF BREATH  *UNUSUAL BRUISING OR BLEEDING  TENDERNESS IN MOUTH AND THROAT WITH OR WITHOUT PRESENCE OF ULCERS  *URINARY PROBLEMS  *BOWEL PROBLEMS  UNUSUAL RASH Items with * indicate a potential emergency and should be followed up as soon as possible.  Feel free to call the clinic should you have any questions or concerns. The clinic phone number is [phone].  Please show the CHEMO ALERT CARD at check-in to the Emergency Department and triage nurse.   

## 2017-09-15 NOTE — Progress Notes (Signed)
[facility] Follow up:    Erin Spruce, FNP [place] [id number]   DIAGNOSIS: Cancer Staging Malignant neoplasm of lower-outer quadrant of left breast of female, estrogen receptor negative ([place]) Staging form: Breast, AJCC 8th Edition - Clinical stage from 05/18/2017: Stage IA (cT1b, cN0, cM0, G3, ER: Negative, PR: Negative, HER2: Positive) - Unsigned - Pathologic: Stage IA (pT1c, pN0, cM0, G3, ER: Negative, PR: Negative, HER2: Positive) - Signed by [name], NP on 07/07/2017   SUMMARY OF ONCOLOGIC HISTORY:   Malignant neoplasm of lower-outer quadrant of left breast of female, estrogen receptor negative ([place])   05/11/2017 Initial Diagnosis    Left breast biopsy 3:30 position 6 cm from nipple: IDC with DCIS, lymphovascular invasion present, grade 2-3, ER 0%, PR 0%, HER-2 positive ratio 2.28, Ki-67 20%, 1 cm lesion left breast, T1b N0 stage IA clinical stage      06/09/2017 Surgery    Left lumpectomy: IDC grade 3, 1.2 cm, DCIS is present, margins negative, 0/6 lymph nodes negative, ER 0%, PR 0%, HER-2 positive ratio 2.28, Ki-67 20%, T1 BN 0 stage IA      07/09/2017 -  Adjuvant Chemotherapy    Taxol/Herceptin       CURRENT THERAPY: Taxol/Herceptin  INTERVAL HISTORY: Erin [name] 57 y.o. female returns for evaluation prior to receiving Taxol and Herceptin.  She is doing well today. She went to cardiology clinic on 12/4 and that visit went well.  She has mild intermittent numbness in her feet that is not really bothering her.  She has had a URI for the past 2 weeks that is getting better.  She has noted a significant surge in her facial acne, and has many pustular lesions on her face and neck.  These are bothering her.     Patient Active Problem List   Diagnosis Date Noted  . Hyperlipidemia associated with type 2 diabetes mellitus ([place]) 08/27/2017  . Ovarian mass, left 08/26/2017  . Menorrhagia 08/26/2017  . Port-A-Cath  in place 07/23/2017  . Malignant neoplasm of lower-outer quadrant of left breast of female, estrogen receptor negative ([place]) 05/13/2017  . Abdominal bloating 04/02/2017  . Chronic right-sided low back pain without sciatica 07/02/2016  . DM type 2 (diabetes mellitus, type 2) ([place]) 11/18/2014  . Intermittent constipation 06/06/2010  . UNSPECIFIED IRON DEFICIENCY ANEMIA 04/22/2010  . DEPRESSION 04/10/2010  . DEAFNESS 04/10/2010    has No Known Allergies.  MEDICAL HISTORY: Past Medical History:  Diagnosis Date  . Breast cancer ([place]) 05/2017   left  . Cough 06/03/2017  . Deaf   . Dyspnea    states SOB with ADLs and at night - no home O2  . High cholesterol   . Non-insulin dependent type 2 diabetes mellitus ([place])   . Runny nose 06/03/2017   clear drainage, per pt.    SURGICAL HISTORY: Past Surgical History:  Procedure Laterality Date  . BREAST LUMPECTOMY WITH RADIOACTIVE SEED AND SENTINEL LYMPH NODE BIOPSY Left 06/09/2017   Procedure: BREAST LUMPECTOMY WITH RADIOACTIVE SEED AND SENTINEL LYMPH NODE BIOPSY;  Surgeon: Excell Seltzer, MD;  Location: [facility];  Service: General;  Laterality: Left;  . PORTACATH PLACEMENT Right 06/09/2017   Procedure: INSERTION PORT-A-CATH WITH [place];  Surgeon: Excell Seltzer, MD;  Location: [facility];  Service: General;  Laterality: Right;  . TUBAL LIGATION  02/04/2002    SOCIAL HISTORY: Social History   Socioeconomic History  . Marital status: Legally  Separated    Spouse name: Not on file  . Number of children: Not on file  . Years of education: Not on file  . Highest education level: Not on file  Social Needs  . Financial resource strain: Not on file  . Food insecurity - worry: Not on file  . Food insecurity - inability: Not on file  . Transportation needs - medical: Not on file  . Transportation needs - non-medical: Not on file  Occupational History  . Not on file  Tobacco Use  . Smoking status: Never  Smoker  . Smokeless tobacco: Never Used  Substance and Sexual Activity  . Alcohol use: No  . Drug use: No  . Sexual activity: No  Other Topics Concern  . Not on file  Social History Narrative  . Not on file    FAMILY HISTORY: Family History  Problem Relation Age of Onset  . Hypertension Mother   . Diabetes Father   . Hypertension Father   . Hypertension Sister   . Cancer Paternal Grandmother   . Diabetes Paternal Grandmother   . CAD Brother   . Breast cancer Paternal Aunt   . Breast cancer Paternal Aunt     Review of Systems  Constitutional: Negative for appetite change, chills, fatigue and fever.  HENT:   Negative for hearing loss and lump/mass.   Eyes: Negative for eye problems and icterus.  Respiratory: Negative for chest tightness, cough and shortness of breath.   Cardiovascular: Negative for chest pain, leg swelling and palpitations.  Gastrointestinal: Negative for abdominal distention, abdominal pain, constipation, diarrhea, nausea and vomiting.  Endocrine: Negative for hot flashes.  Skin: Negative for itching and rash.  Neurological: Positive for numbness (see interval history). Negative for dizziness, extremity weakness and headaches.  Hematological: Negative for adenopathy. Does not bruise/bleed easily.  Psychiatric/Behavioral: Negative for depression. The patient is not nervous/anxious.       PHYSICAL EXAMINATION  ECOG PERFORMANCE STATUS: 1 - Symptomatic but completely ambulatory  Vitals:   09/15/17 0822  BP: 137/73  Pulse: 99  Resp: 18  Temp: 98.4 F (36.9 C)  SpO2: 100%    Physical Exam  Constitutional: She is oriented to person, place, and time and well-developed, well-nourished, and in no distress.  HENT:  Head: Normocephalic and atraumatic.  Mouth/Throat: Oropharynx is clear and moist. No oropharyngeal exudate.  Eyes: Pupils are equal, round, and reactive to light. No scleral icterus.  Neck: Neck supple.  Cardiovascular: Normal rate,  regular rhythm, normal heart sounds and intact distal pulses.  Pulmonary/Chest: Effort normal and breath sounds normal.  Abdominal: Soft. Bowel sounds are normal. She exhibits no distension and no mass. There is no tenderness. There is no rebound and no guarding.  Musculoskeletal: She exhibits no edema.  Lymphadenopathy:    She has no cervical adenopathy.  Neurological: She is alert and oriented to person, place, and time.  Skin: Skin is warm and dry. No erythema.  Pustular acne lesions scattered over face, and neck  Psychiatric: Mood and affect normal.    LABORATORY DATA:  CBC    Component Value Date/Time   WBC 8.9 09/15/2017 0800   WBC 10.3 10/12/2014 1257   RBC 3.42 (L) 09/15/2017 0800   RBC 4.07 10/12/2014 1257   HGB 9.3 (L) 09/15/2017 0800   HCT 28.2 (L) 09/15/2017 0800   PLT 357 09/15/2017 0800   PLT 375 05/26/2017 0918   MCV 82.4 09/15/2017 0800   MCH 27.1 09/15/2017 0800   MCH 26.5  10/12/2014 1257   MCHC 32.9 09/15/2017 0800   MCHC 31.9 10/12/2014 1257   RDW 20.8 (H) 09/15/2017 0800   LYMPHSABS 2.5 09/15/2017 0800   MONOABS 0.8 09/15/2017 0800   EOSABS 0.1 09/15/2017 0800   EOSABS 0.1 05/26/2017 0918   BASOSABS 0.0 09/15/2017 0800    CMP     Component Value Date/Time   NA 139 09/15/2017 0800   K 3.8 09/15/2017 0800   CL 103 04/02/2017 1208   CO2 23 09/15/2017 0800   GLUCOSE 113 09/15/2017 0800   BUN 8.7 09/15/2017 0800   CREATININE 0.7 09/15/2017 0800   CALCIUM 9.2 09/15/2017 0800   PROT 7.3 09/15/2017 0800   ALBUMIN 3.3 (L) 09/15/2017 0800   AST 13 09/15/2017 0800   ALT 19 09/15/2017 0800   ALKPHOS 74 09/15/2017 0800   BILITOT 0.27 09/15/2017 0800   GFRNONAA 104 04/02/2017 1208   GFRAA 120 04/02/2017 1208      ASSESSMENT and PLAN:   Malignant neoplasm of lower-outer quadrant of left breast of female, estrogen receptor negative ([place]) 06/09/2017: Left lumpectomy: IDC grade 3, 1.2 cm, DCIS is present, margins negative, 0/6 lymph nodes negative, ER  0%, PR 0%, HER-2 positive ratio 2.28, Ki-67 20%, T1 BN 0 stage IA  Treatment plan: 1. Adjuvant chemotherapy with Taxol Herceptin weekly 12 followed by Herceptin every 3 weeks for total of 6 months 2. Followed by adjuvant radiation  Echo on 08/17/17 demonstrates LVEF 55-60%  ____________________________________________________________________  Current treatment: Cycle 6 Taxol Herceptin Chemo toxicities: 1.  GERD: On PPI therapy 2. Acne/folliculitis: Doxycycline BID 3. Mild intermittent peripheral neuropathy: will monitor for now, should it worsen would need to consider dose reduction of Taxol/early discontinuation.  Return to clinic weekly for Taxol and Herceptin and f/u.    All questions were answered. The patient knows to call the clinic with any problems, questions or concerns. We can certainly see the patient much sooner if necessary.  A total of (30) minutes of face-to-face time was spent with this patient with greater than 50% of that time in counseling and care-coordination.  This note was electronically signed. Scot Dock, NP 09/15/2017

## 2017-09-15 NOTE — Assessment & Plan Note (Signed)
06/09/2017: Left lumpectomy: IDC grade 3, 1.2 cm, DCIS is present, margins negative, 0/6 lymph nodes negative, ER 0%, PR 0%, HER-2 positive ratio 2.28, Ki-67 20%, T1 BN 0 stage IA  Treatment plan: 1. Adjuvant chemotherapy with Taxol Herceptin weekly 12 followed by Herceptin every 3 weeks for total of 6 months 2. Followed by adjuvant radiation  Echo on 08/17/17 demonstrates LVEF 55-60%  ____________________________________________________________________  Current treatment: Cycle 6 Taxol Herceptin Chemo toxicities: 1.  GERD: On PPI therapy 2. Acne/folliculitis: Doxycycline BID 3. Mild intermittent peripheral neuropathy: will monitor for now, should it worsen would need to consider dose reduction of Taxol/early discontinuation.  Return to clinic weekly for Taxol and Herceptin and f/u.

## 2017-09-17 ENCOUNTER — Encounter: Payer: Self-pay | Admitting: Family Medicine

## 2017-09-17 ENCOUNTER — Ambulatory Visit (INDEPENDENT_AMBULATORY_CARE_PROVIDER_SITE_OTHER): Payer: Medicaid Other | Admitting: Family Medicine

## 2017-09-17 ENCOUNTER — Encounter (HOSPITAL_COMMUNITY): Payer: Self-pay

## 2017-09-17 VITALS — BP 133/66 | HR 96 | Wt 160.0 lb

## 2017-09-17 DIAGNOSIS — N921 Excessive and frequent menstruation with irregular cycle: Secondary | ICD-10-CM

## 2017-09-17 DIAGNOSIS — N839 Noninflammatory disorder of ovary, fallopian tube and broad ligament, unspecified: Secondary | ICD-10-CM

## 2017-09-17 DIAGNOSIS — N838 Other noninflammatory disorders of ovary, fallopian tube and broad ligament: Secondary | ICD-10-CM

## 2017-09-17 NOTE — Patient Instructions (Signed)
Hysterectomy Information A hysterectomy is a surgery to remove your uterus. After surgery, you will no longer have periods. Also, you will not be able to get pregnant. Reasons for this surgery  You have bleeding that is not normal and keeps coming back.  You have lasting (chronic) lower belly (pelvic) pain.  You have a lasting infection.  The lining of your uterus grows outside your uterus.  The lining of your uterus grows in the muscle of your uterus.  Your uterus falls down into your vagina.  You have a growth in your uterus that causes problems.  You have cells that could turn into cancer (precancerous cells).  You have cancer of the uterus or cervix. Types There are 3 types of hysterectomies. Depending on the type, the surgery will:  Remove the top part of the uterus only.  Remove the uterus and the cervix.  Remove the uterus, cervix, and tissue that holds the uterus in place in the lower belly.  Ways a hysterectomy can be performed There are 5 ways this surgery can be performed.  A cut (incision) is made in the belly (abdomen). The uterus is taken out through the cut.  A cut is made in the vagina. The uterus is taken out through the cut.  Three or four cuts are made in the belly. A surgical device with a camera is put through one of the cuts. The uterus is cut into small pieces. The uterus is taken out through the cuts or the vagina.  Three or four cuts are made in the belly. A surgical device with a camera is put through one of the cuts. The uterus is taken out through the vagina.  Three or four cuts are made in the belly. A surgical device that is controlled by a computer makes a visual image. The device helps the surgeon control the surgical tools. The uterus is cut into small pieces. The pieces are taken out through the cuts or through the vagina.  What can I expect after the surgery?  You will be given pain medicine.  You will need help at home for 3-5 days  after surgery.  You will need to see your doctor in 2-4 weeks after surgery.  You may get hot flashes, have night sweats, and have trouble sleeping.  You may need to have Pap tests in the future if your surgery was related to cancer. Talk to your doctor. It is still good to have regular exams. This information is not intended to replace advice given to you by your health care provider. Make sure you discuss any questions you have with your health care provider. Document Released: 11/23/2011 Document Revised: 02/06/2016 Document Reviewed: 05/08/2013 Elsevier Interactive Patient Education  2018 Elsevier Inc.  

## 2017-09-17 NOTE — Assessment & Plan Note (Signed)
Appears certainly benign--will attempt laparoscopic removal with hyst.

## 2017-09-17 NOTE — Assessment & Plan Note (Addendum)
Given bleeding, anemia, and need for oophorectomy will schedule for LAVH with Bilateral salpingectomy and removal of ovarian mass. Will attempt to schedule between her chemo appointments. Risks include but are not limited to bleeding which may lead to transfusion, infection which may lead to rehospitalization, injury to surrounding structures, including bowel, bladder and ureters, blood clots, and death.  Likelihood of success is high.

## 2017-09-17 NOTE — Progress Notes (Signed)
   Subjective:    Patient ID: Erin [name] is a 57 y.o. female presenting with Follow-up  on 09/17/2017  HPI: Here for f/u. Having treatment for breast cancer with lumpectomy and chemo--taxol ends 1/16. Second agent continues q 3 wks x 1 year. Some abnormal vaginal bleeding noted in September and October and had pelvic sono done which showed endometrial thickness of 10 mm and cyst on left ovary. Further imaging performed which showed complex cyst in the left ovary is 12 cm and appears benign on MRI--CA125 was WNL. Endometrial thickness is 16 mm but with absence of blood flow-felt to be a contraction in the myometrium.   Review of Systems  Constitutional: Negative for chills and fever.  Respiratory: Negative for shortness of breath.   Cardiovascular: Negative for chest pain.  Gastrointestinal: Negative for abdominal pain, nausea and vomiting.  Genitourinary: Negative for dysuria.  Skin: Negative for rash.      Objective:    BP 133/66   Pulse 96   Wt 160 lb (72.6 kg)   LMP 07/09/2017   BMI 30.23 kg/m  Physical Exam  Constitutional: She is oriented to person, place, and time. She appears well-developed and well-nourished. No distress.  HENT:  Head: Normocephalic and atraumatic.  Eyes: No scleral icterus.  Neck: Neck supple.  Cardiovascular: Normal rate.  Pulmonary/Chest: Effort normal.  Abdominal: Soft.  Neurological: She is alert and oriented to person, place, and time.  Skin: Skin is warm and dry.  Psychiatric: She has a normal mood and affect.        Assessment & Plan:   Problem List Items Addressed This Visit      Unprioritized   Ovarian mass, left    Appears certainly benign--will attempt laparoscopic removal with hyst.      Menorrhagia - Primary    Given bleeding, anemia, and need for oophorectomy will schedule for LAVH with Bilateral salpingectomy and removal of ovarian mass. Will attempt to schedule between her chemo appointments. Risks include but are not  limited to bleeding which may lead to transfusion, infection which may lead to rehospitalization, injury to surrounding structures, including bowel, bladder and ureters, blood clots, and death.  Likelihood of success is high.          Total face-to-face time with patient: 25 minutes. Over 50% of encounter was spent on counseling and coordination of care. Return in about 3 months (around 12/16/2017) for postop check.  [name] 09/17/2017 8:24 AM

## 2017-09-20 NOTE — Assessment & Plan Note (Signed)
06/09/2017: Left lumpectomy: IDC grade 3, 1.2 cm, DCIS is present, margins negative, 0/6 lymph nodes negative, ER 0%, PR 0%, HER-2 positive ratio 2.28, Ki-67 20%, T1 BN 0 stage IA  Treatment plan: 1. Adjuvant chemotherapy with Taxol Herceptin weekly 12 followed by Herceptin every 3 weeks for total of 6 months 2. Followed by adjuvant radiation  Echo on 08/17/17 demonstrates LVEF 55-60% ____________________________________________________________________  Current treatment: Cycle 11 Taxol Herceptin Chemo toxicities: 1.  GERD: On PPI therapy 2. Acne/folliculitis: Doxycycline BID 3. Mild intermittent peripheral neuropathy: will monitor for now, should it worsen would need to consider dose reduction of Taxol/early discontinuation.  Return to clinic weekly for Taxol and Herceptin and f/u.

## 2017-09-21 ENCOUNTER — Inpatient Hospital Stay: Payer: Medicaid Other

## 2017-09-21 ENCOUNTER — Inpatient Hospital Stay (HOSPITAL_BASED_OUTPATIENT_CLINIC_OR_DEPARTMENT_OTHER): Payer: Medicaid Other | Admitting: Hematology and Oncology

## 2017-09-21 ENCOUNTER — Inpatient Hospital Stay: Payer: Medicaid Other | Attending: Hematology and Oncology

## 2017-09-21 ENCOUNTER — Other Ambulatory Visit: Payer: Self-pay | Admitting: *Deleted

## 2017-09-21 DIAGNOSIS — Z5112 Encounter for antineoplastic immunotherapy: Secondary | ICD-10-CM | POA: Insufficient documentation

## 2017-09-21 DIAGNOSIS — Z800 Family history of malignant neoplasm of digestive organs: Secondary | ICD-10-CM | POA: Diagnosis not present

## 2017-09-21 DIAGNOSIS — C50512 Malignant neoplasm of lower-outer quadrant of left female breast: Secondary | ICD-10-CM

## 2017-09-21 DIAGNOSIS — Z5111 Encounter for antineoplastic chemotherapy: Secondary | ICD-10-CM | POA: Insufficient documentation

## 2017-09-21 DIAGNOSIS — H9190 Unspecified hearing loss, unspecified ear: Secondary | ICD-10-CM | POA: Diagnosis not present

## 2017-09-21 DIAGNOSIS — Z171 Estrogen receptor negative status [ER-]: Secondary | ICD-10-CM

## 2017-09-21 DIAGNOSIS — E7800 Pure hypercholesterolemia, unspecified: Secondary | ICD-10-CM | POA: Insufficient documentation

## 2017-09-21 DIAGNOSIS — N839 Noninflammatory disorder of ovary, fallopian tube and broad ligament, unspecified: Secondary | ICD-10-CM | POA: Insufficient documentation

## 2017-09-21 DIAGNOSIS — G620 Drug-induced polyneuropathy: Secondary | ICD-10-CM

## 2017-09-21 DIAGNOSIS — E119 Type 2 diabetes mellitus without complications: Secondary | ICD-10-CM | POA: Diagnosis not present

## 2017-09-21 DIAGNOSIS — Z803 Family history of malignant neoplasm of breast: Secondary | ICD-10-CM | POA: Insufficient documentation

## 2017-09-21 DIAGNOSIS — T451X5S Adverse effect of antineoplastic and immunosuppressive drugs, sequela: Secondary | ICD-10-CM

## 2017-09-21 DIAGNOSIS — K219 Gastro-esophageal reflux disease without esophagitis: Secondary | ICD-10-CM | POA: Insufficient documentation

## 2017-09-21 DIAGNOSIS — Z95828 Presence of other vascular implants and grafts: Secondary | ICD-10-CM

## 2017-09-21 DIAGNOSIS — L739 Follicular disorder, unspecified: Secondary | ICD-10-CM | POA: Diagnosis not present

## 2017-09-21 DIAGNOSIS — R5383 Other fatigue: Secondary | ICD-10-CM

## 2017-09-21 DIAGNOSIS — R531 Weakness: Secondary | ICD-10-CM | POA: Insufficient documentation

## 2017-09-21 DIAGNOSIS — Z7984 Long term (current) use of oral hypoglycemic drugs: Secondary | ICD-10-CM

## 2017-09-21 DIAGNOSIS — Z79899 Other long term (current) drug therapy: Secondary | ICD-10-CM | POA: Insufficient documentation

## 2017-09-21 LAB — CBC WITH DIFFERENTIAL/PLATELET
Abs Granulocyte: 4.6 10*3/uL (ref 1.5–6.5)
BASOS ABS: 0 10*3/uL (ref 0.0–0.1)
BASOS PCT: 0 %
Eosinophils Absolute: 0.1 10*3/uL (ref 0.0–0.5)
Eosinophils Relative: 1 %
HCT: 27.3 % — ABNORMAL LOW (ref 34.8–46.6)
HEMOGLOBIN: 8.7 g/dL — AB (ref 11.6–15.9)
LYMPHS ABS: 2.6 10*3/uL (ref 0.9–3.3)
Lymphocytes Relative: 33 %
MCH: 26.8 pg (ref 25.1–34.0)
MCHC: 32 g/dL (ref 31.5–36.0)
MCV: 83.6 fL (ref 79.5–101.0)
Monocytes Absolute: 0.5 10*3/uL (ref 0.1–0.9)
Monocytes Relative: 7 %
NEUTROS ABS: 4.6 10*3/uL (ref 1.5–6.5)
NEUTROS PCT: 59 %
Platelets: 369 10*3/uL (ref 145–400)
RBC: 3.27 MIL/uL — ABNORMAL LOW (ref 3.70–5.45)
RDW: 21.3 % — ABNORMAL HIGH (ref 11.2–16.1)
WBC: 7.8 10*3/uL (ref 3.9–10.3)

## 2017-09-21 LAB — COMPREHENSIVE METABOLIC PANEL
ALBUMIN: 3.2 g/dL — AB (ref 3.5–5.0)
ALT: 18 U/L (ref 0–55)
AST: 15 U/L (ref 5–34)
Alkaline Phosphatase: 71 U/L (ref 40–150)
Anion gap: 6 (ref 3–11)
BILIRUBIN TOTAL: 0.2 mg/dL (ref 0.2–1.2)
BUN: 10 mg/dL (ref 7–26)
CHLORIDE: 108 mmol/L (ref 98–109)
CO2: 26 mmol/L (ref 22–29)
CREATININE: 0.73 mg/dL (ref 0.60–1.10)
Calcium: 8.9 mg/dL (ref 8.4–10.4)
GFR calc Af Amer: >60 mL/min (ref >60)
GLUCOSE: 96 mg/dL (ref 70–140)
Potassium: 3.8 mmol/L (ref 3.3–4.7)
Sodium: 140 mmol/L (ref 136–145)
Total Protein: 6.9 g/dL (ref 6.4–8.3)

## 2017-09-21 MED ORDER — SODIUM CHLORIDE 0.9% FLUSH
10.0000 mL | INTRAVENOUS | Status: DC | PRN
  Administered 2017-09-21: 10 mL via INTRAVENOUS
  Filled 2017-09-21: qty 10

## 2017-09-21 NOTE — Progress Notes (Signed)
Patient Care Team: [name], FNP as PCP - General (Family Medicine) Excell Seltzer, MD as Consulting Physician (General Surgery) [name], MD as Consulting Physician (Hematology and Oncology) Kyung Rudd, MD as Consulting Physician (Radiation Oncology)  DIAGNOSIS:  Encounter Diagnosis  Name Primary?  . Malignant neoplasm of lower-outer quadrant of left breast of female, estrogen receptor negative ([place])     SUMMARY OF ONCOLOGIC HISTORY:   Malignant neoplasm of lower-outer quadrant of left breast of female, estrogen receptor negative ([place])   05/11/2017 Initial Diagnosis    Left breast biopsy 3:30 position 6 cm from nipple: IDC with DCIS, lymphovascular invasion present, grade 2-3, ER 0%, PR 0%, HER-2 positive ratio 2.28, Ki-67 20%, 1 cm lesion left breast, T1b N0 stage IA clinical stage      06/09/2017 Surgery    Left lumpectomy: IDC grade 3, 1.2 cm, DCIS is present, margins negative, 0/6 lymph nodes negative, ER 0%, PR 0%, HER-2 positive ratio 2.28, Ki-67 20%, T1 BN 0 stage IA      07/09/2017 -  Adjuvant Chemotherapy    Taxol/Herceptin      CHIEF COMPLIANT: Cycle 11 Taxol Herceptin  INTERVAL HISTORY: Erin [name] is a 57 year old with above-mentioned history left breast cancer treated with lumpectomy and is now on adjuvant chemotherapy and today's cycle 11 of Taxol Herceptin.  She is tolerating the treatment extremely well.  Apart from the fatigue and the generalized weakness related to the anemia from chemotherapy if he does not have any major problems.  She does have very mild neuropathy but it is not bothering her.  It is not significant enough to warrant any change in the dosing or treatment plan.  REVIEW OF SYSTEMS:   Constitutional: Denies fevers, chills or abnormal weight loss Eyes: Denies blurriness of vision Ears, nose, mouth, throat, and face: Denies mucositis or sore throat Respiratory: Denies cough, dyspnea or wheezes Cardiovascular: Denies  palpitation, chest discomfort Gastrointestinal:  Denies nausea, heartburn or change in bowel habits Skin: Denies abnormal skin rashes Lymphatics: Denies new lymphadenopathy or easy bruising Neurological: Mild peripheral neuropathy in the hands Behavioral/Psych: Mood is stable, no new changes  Extremities: No lower extremity edema All other systems were reviewed with the patient and are negative.  I have reviewed the past medical history, past surgical history, social history and family history with the patient and they are unchanged from previous note.  ALLERGIES:  has No Known Allergies.  MEDICATIONS:  Current Outpatient Medications  Medication Sig Dispense Refill  . atorvastatin (LIPITOR) 40 MG tablet Take 1 tablet (40 mg total) by mouth daily. 90 tablet 1  . BIOTIN 5000 PO Take by mouth daily.    . Blood Glucose Monitoring Suppl (ACCU-CHEK AVIVA PLUS) W/DEVICE KIT Check Blood sugar AM fasting and before dinner 1 kit 0  . cholecalciferol (VITAMIN D) 1000 units tablet Take 1,000 Units by mouth daily.    Marland Kitchen doxycycline (VIBRA-TABS) 100 MG tablet Take 1 tablet (100 mg total) by mouth 2 (two) times daily. 20 tablet 0  . ferrous sulfate 325 (65 FE) MG tablet TAKE 1 TABLET BY MOUTH EVERY DAY WITH BREAKFAST 60 tablet 2  . glucose blood (ACCU-CHEK AVIVA) test strip Use as instructed 100 each 12  . Lancet Devices (ACCU-CHEK SOFTCLIX) lancets Use as instructed 1 each 0  . lidocaine-prilocaine (EMLA) cream Apply to affected area once 30 g 3  . lisinopril (PRINIVIL,ZESTRIL) 2.5 MG tablet Take 1 tablet (2.5 mg total) by mouth daily. 30 tablet 5  .  metFORMIN (GLUCOPHAGE XR) 500 MG 24 hr tablet Take 2 tablets (1,000 mg total) by mouth daily with breakfast. 60 tablet 11  . Omega-3 Fatty Acids (FISH OIL OMEGA-3 PO) Take by mouth.    . TRUEPLUS LANCETS 28G MISC USE AS DIRECTED BY PHYSICIAN 100 each 0   No current facility-administered medications for this visit.     PHYSICAL EXAMINATION: ECOG  PERFORMANCE STATUS: 1 - Symptomatic but completely ambulatory  Vitals:   09/21/17 0838  BP: 134/74  Pulse: 91  Resp: 18  Temp: 98 F (36.7 C)  SpO2: 100%   Filed Weights   09/21/17 0838  Weight: 161 lb (73 kg)    GENERAL:alert, no distress and comfortable SKIN: skin color, texture, turgor are normal, no rashes or significant lesions EYES: normal, Conjunctiva are pink and non-injected, sclera clear OROPHARYNX:no exudate, no erythema and lips, buccal mucosa, and tongue normal  NECK: supple, thyroid normal size, non-tender, without nodularity LYMPH:  no palpable lymphadenopathy in the cervical, axillary or inguinal LUNGS: clear to auscultation and percussion with normal breathing effort HEART: regular rate & rhythm and no murmurs and no lower extremity edema ABDOMEN:abdomen soft, non-tender and normal bowel sounds MUSCULOSKELETAL:no cyanosis of digits and no clubbing  NEURO: alert & oriented x 3 with fluent speech, no focal motor/sensory deficits EXTREMITIES: No lower extremity edema  LABORATORY DATA:  I have reviewed the data as listed   Chemistry      Component Value Date/Time   NA 139 09/15/2017 0800   K 3.8 09/15/2017 0800   CL 103 04/02/2017 1208   CO2 23 09/15/2017 0800   BUN 8.7 09/15/2017 0800   CREATININE 0.7 09/15/2017 0800      Component Value Date/Time   CALCIUM 9.2 09/15/2017 0800   ALKPHOS 74 09/15/2017 0800   AST 13 09/15/2017 0800   ALT 19 09/15/2017 0800   BILITOT 0.27 09/15/2017 0800       Lab Results  Component Value Date   WBC 8.9 09/15/2017   HGB 9.3 (L) 09/15/2017   HCT 28.2 (L) 09/15/2017   MCV 82.4 09/15/2017   PLT 357 09/15/2017   NEUTROABS 5.5 09/15/2017    ASSESSMENT & PLAN:  Malignant neoplasm of lower-outer quadrant of left breast of female, estrogen receptor negative ([place]) 06/09/2017: Left lumpectomy: IDC grade 3, 1.2 cm, DCIS is present, margins negative, 0/6 lymph nodes negative, ER 0%, PR 0%, HER-2 positive ratio 2.28, Ki-67  20%, T1 BN 0 stage IA  Treatment plan: 1. Adjuvant chemotherapy with Taxol Herceptin weekly 12 followed by Herceptin every 3 weeks for total of 6 months 2. Followed by adjuvant radiation  Echo on 08/17/17 demonstrates LVEF 55-60% ____________________________________________________________________  Current treatment: Cycle 11 Taxol Herceptin Chemo toxicities: 1.  GERD: On PPI therapy 2. Acne/folliculitis: Doxycycline BID 3. Mild intermittent peripheral neuropathy: will monitor for now, should it worsen would need to consider dose reduction of Taxol/early discontinuation.  Return to clinic weekly for Taxol and Herceptin and f/u.    I spent 25 minutes talking to the patient of which more than half was spent in counseling and coordination of care.  No orders of the defined types were placed in this encounter.  The patient has a good understanding of the overall plan. she agrees with it. she will call with any problems that may develop before the next visit here.   Harriette Ohara, MD 09/21/17

## 2017-09-22 ENCOUNTER — Inpatient Hospital Stay: Payer: Medicaid Other

## 2017-09-22 VITALS — BP 124/66 | HR 90 | Temp 98.4°F | Resp 17

## 2017-09-22 DIAGNOSIS — Z171 Estrogen receptor negative status [ER-]: Principal | ICD-10-CM

## 2017-09-22 DIAGNOSIS — Z95828 Presence of other vascular implants and grafts: Secondary | ICD-10-CM

## 2017-09-22 DIAGNOSIS — Z5112 Encounter for antineoplastic immunotherapy: Secondary | ICD-10-CM | POA: Diagnosis not present

## 2017-09-22 DIAGNOSIS — C50512 Malignant neoplasm of lower-outer quadrant of left female breast: Secondary | ICD-10-CM

## 2017-09-22 MED ORDER — ACETAMINOPHEN 325 MG PO TABS
ORAL_TABLET | ORAL | Status: AC
  Filled 2017-09-22: qty 2

## 2017-09-22 MED ORDER — DIPHENHYDRAMINE HCL 50 MG/ML IJ SOLN
INTRAMUSCULAR | Status: AC
Start: 2017-09-22 — End: 2017-09-22
  Filled 2017-09-22: qty 1

## 2017-09-22 MED ORDER — SODIUM CHLORIDE 0.9 % IV SOLN
80.0000 mg/m2 | Freq: Once | INTRAVENOUS | Status: AC
  Administered 2017-09-22: 144 mg via INTRAVENOUS
  Filled 2017-09-22: qty 24

## 2017-09-22 MED ORDER — SODIUM CHLORIDE 0.9 % IV SOLN
10.0000 mg | Freq: Once | INTRAVENOUS | Status: AC
  Administered 2017-09-22: 10 mg via INTRAVENOUS
  Filled 2017-09-22: qty 1

## 2017-09-22 MED ORDER — DIPHENHYDRAMINE HCL 50 MG/ML IJ SOLN
25.0000 mg | Freq: Once | INTRAMUSCULAR | Status: AC
  Administered 2017-09-22: 25 mg via INTRAVENOUS

## 2017-09-22 MED ORDER — FAMOTIDINE IN NACL 20-0.9 MG/50ML-% IV SOLN
20.0000 mg | Freq: Once | INTRAVENOUS | Status: AC
  Administered 2017-09-22: 20 mg via INTRAVENOUS

## 2017-09-22 MED ORDER — SODIUM CHLORIDE 0.9 % IV SOLN
Freq: Once | INTRAVENOUS | Status: AC
  Administered 2017-09-22: 09:00:00 via INTRAVENOUS

## 2017-09-22 MED ORDER — TRASTUZUMAB CHEMO 150 MG IV SOLR
150.0000 mg | Freq: Once | INTRAVENOUS | Status: AC
  Administered 2017-09-22: 150 mg via INTRAVENOUS
  Filled 2017-09-22: qty 7.14

## 2017-09-22 MED ORDER — ACETAMINOPHEN 325 MG PO TABS
650.0000 mg | ORAL_TABLET | Freq: Once | ORAL | Status: AC
  Administered 2017-09-22: 650 mg via ORAL

## 2017-09-22 MED ORDER — SODIUM CHLORIDE 0.9 % IV SOLN
510.0000 mg | Freq: Once | INTRAVENOUS | Status: AC
  Administered 2017-09-22: 510 mg via INTRAVENOUS
  Filled 2017-09-22: qty 17

## 2017-09-22 MED ORDER — FAMOTIDINE IN NACL 20-0.9 MG/50ML-% IV SOLN
INTRAVENOUS | Status: AC
  Filled 2017-09-22: qty 50

## 2017-09-22 MED ORDER — HEPARIN SOD (PORK) LOCK FLUSH 100 UNIT/ML IV SOLN
500.0000 [IU] | Freq: Once | INTRAVENOUS | Status: AC | PRN
  Administered 2017-09-22: 500 [IU]
  Filled 2017-09-22: qty 5

## 2017-09-22 MED ORDER — SODIUM CHLORIDE 0.9% FLUSH
10.0000 mL | INTRAVENOUS | Status: DC | PRN
  Administered 2017-09-22: 10 mL
  Filled 2017-09-22: qty 10

## 2017-09-22 NOTE — Patient Instructions (Signed)
[facility] Discharge Instructions for Patients Receiving Chemotherapy  Today you received the following chemotherapy agents Taxol and Herceptin  To help prevent nausea and vomiting after your treatment, we encourage you to take your nausea medication as directed If you develop nausea and vomiting that is not controlled by your nausea medication, call the clinic.   BELOW ARE SYMPTOMS THAT SHOULD BE REPORTED IMMEDIATELY:  *FEVER GREATER THAN 100.5 F  *CHILLS WITH OR WITHOUT FEVER  NAUSEA AND VOMITING THAT IS NOT CONTROLLED WITH YOUR NAUSEA MEDICATION  *UNUSUAL SHORTNESS OF BREATH  *UNUSUAL BRUISING OR BLEEDING  TENDERNESS IN MOUTH AND THROAT WITH OR WITHOUT PRESENCE OF ULCERS  *URINARY PROBLEMS  *BOWEL PROBLEMS  UNUSUAL RASH Items with * indicate a potential emergency and should be followed up as soon as possible.  Feel free to call the clinic should you have any questions or concerns. The clinic phone number is [phone].  Please show the [facility] at check-in to the Emergency Department and triage nurse.  Ferumoxytol injection What is this medicine? FERUMOXYTOL is an iron complex. Iron is used to make healthy red blood cells, which carry oxygen and nutrients throughout the body. This medicine is used to treat iron deficiency anemia in people with chronic kidney disease. This medicine may be used for other purposes; ask your health care provider or pharmacist if you have questions. COMMON BRAND NAME(S): Feraheme What should I tell my health care provider before I take this medicine? They need to know if you have any of these conditions: -anemia not caused by low iron levels -high levels of iron in the blood -magnetic resonance imaging (MRI) test scheduled -an unusual or allergic reaction to iron, other medicines, foods, dyes, or preservatives -pregnant or trying to get pregnant -breast-feeding How should I use this medicine? This medicine is  for injection into a vein. It is given by a health care professional in a hospital or clinic setting. Talk to your pediatrician regarding the use of this medicine in children. Special care may be needed. Overdosage: If you think you have taken too much of this medicine contact a poison control center or emergency room at once. NOTE: This medicine is only for you. Do not share this medicine with others. What if I miss a dose? It is important not to miss your dose. Call your doctor or health care professional if you are unable to keep an appointment. What may interact with this medicine? This medicine may interact with the following medications: -other iron products This list may not describe all possible interactions. Give your health care provider a list of all the medicines, herbs, non-prescription drugs, or dietary supplements you use. Also tell them if you smoke, drink alcohol, or use illegal drugs. Some items may interact with your medicine. What should I watch for while using this medicine? Visit your doctor or healthcare professional regularly. Tell your doctor or healthcare professional if your symptoms do not start to get better or if they get worse. You may need blood work done while you are taking this medicine. You may need to follow a special diet. Talk to your doctor. Foods that contain iron include: whole grains/cereals, dried fruits, beans, or peas, leafy green vegetables, and organ meats (liver, kidney). What side effects may I notice from receiving this medicine? Side effects that you should report to your doctor or health care professional as soon as possible: -allergic reactions like skin rash, itching or hives, swelling of the face,  lips, or tongue -breathing problems -changes in blood pressure -feeling faint or lightheaded, falls -fever or chills -flushing, sweating, or hot feelings -swelling of the ankles or feet Side effects that usually do not require medical attention  (report to your doctor or health care professional if they continue or are bothersome): -diarrhea -headache -nausea, vomiting -stomach pain This list may not describe all possible side effects. Call your doctor for medical advice about side effects. You may report side effects to FDA at 1-800-FDA-1088. Where should I keep my medicine? This drug is given in a hospital or clinic and will not be stored at home. NOTE: This sheet is a summary. It may not cover all possible information. If you have questions about this medicine, talk to your doctor, pharmacist, or health care provider.  2018 Elsevier/Gold Standard (2015-10-03 12:41:49)

## 2017-09-22 NOTE — Progress Notes (Signed)
@   1025 during first 10 minutes of Herceptin infusion Pt reported a "numbness" on her lower left jaw. No other s/s or complaints. No trouble breathing. No chest pain, or arm tingling. VSS Per Dr [name] slow rate of remainder of Herceptin infusion to go in over an hour. No new orders.  @1045  pt states that "numbness" is getting better, still no other s/s

## 2017-09-23 ENCOUNTER — Ambulatory Visit: Payer: Medicare Other | Admitting: Family Medicine

## 2017-09-23 NOTE — Progress Notes (Signed)
Location of Breast Cancer: Malignant neoplasm of lower-outer quadrant of left breast of female, estrogen receptor negative ([place])  Histology per Pathology Report:  05/11/17 Diagnosis Breast, left, needle core biopsy, 3:30 o'clock, 6cmfn - INVASIVE DUCTAL CARCINOMA. - DUCTAL CARCINOMA IN SITU. - LYMPHOVASCULAR INVASION IS IDENTIFIED. - SEE COMMENT. 06/09/17 Diagnosis 1. 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 Breast, excision, Left additional superior margin SUPERIOR MARGIN, NEGATIVE FOR CARCINOMA 3. Lymph node, sentinel, biopsy, Left axillary ONE BENIGN LYMPH NODE (0/1) 4. Lymph node, sentinel, biopsy, Left axillary ONE BENIGN LYMPH NODE (0/1) 5. Lymph node, sentinel, biopsy, Left axillary ONE BENIGN LYMPH NODE (0/1) 6. Lymph node, sentinel, biopsy, Left axillary ONE BENIGN LYMPH NODE (0/1) 7. Lymph node, sentinel, biopsy, Left axillary ONE BENIGN LYMPH NODE (0/1) 8. Lymph node, sentinel, biopsy, Left axillary ONE BENIGN LYMPH NODE (0/1)  Receptor Status: ER 0% Negative , PR 0% Negative, Her2- Positive , Ki- 20%  Did patient present with symptoms (if so, please note symptoms) or was this found on screening mammography?:  Patient had a routine screening mammogram that detected an abnormality in the left breast   Past/Anticipated interventions by surgeon, if any: 06/09/17 Dr. Excell Seltzer BREAST LUMPECTOMY WITH RADIOACTIVE SEED AND SENTINEL LYMPH NODE BIOPSY  Past/Anticipated interventions by medical oncology, if any: Chemotherapy  09/21/17 [name] Treatment plan: 1. Adjuvant chemotherapy with Taxol Herceptin weekly 12 followed by Herceptin every 3 weeks for total of 6 months 2. Followed by adjuvant radiation  Echo on 08/17/17 demonstrates LVEF  55-60%  Current treatment: Cycle 11 Taxol Herceptin Chemo toxicities: 1.  GERD: On PPI therapy 2. Acne/folliculitis: Doxycycline BID 3. Mild intermittent peripheral neuropathy: will monitor for now, should it worsen would need to consider dose reduction of Taxol/early discontinuation.  Return to clinic weekly for Taxol and Herceptin and f/u  Lymphedema issues, if any: No evidence of lymphedema noted    Pain issues, if any:  Denies pain  Patient demonstrates full ROM of both upper extremities. Patient reports mild neuropathy in her finger tips.   SAFETY ISSUES:  Prior radiation? No  Pacemaker/ICD? No  Possible current pregnancy? No  Is the patient on methotrexate? No  Current Complaints / other details:  Marital Status: Separated Gravida: 3 Para: 2 Family History Cancer: Paternal Grandmother Paternal Aunt: Breast  Paternal Aunt: Breast     Tamina A McCain, LPN 0/47/5339,17:92 AM

## 2017-09-24 ENCOUNTER — Ambulatory Visit: Payer: Medicare Other | Admitting: Nurse Practitioner

## 2017-09-24 ENCOUNTER — Encounter: Payer: Self-pay | Admitting: Nurse Practitioner

## 2017-09-24 ENCOUNTER — Ambulatory Visit (INDEPENDENT_AMBULATORY_CARE_PROVIDER_SITE_OTHER): Payer: Medicaid Other | Admitting: Nurse Practitioner

## 2017-09-24 VITALS — BP 122/66 | HR 88 | Ht 61.0 in | Wt 161.0 lb

## 2017-09-24 DIAGNOSIS — D649 Anemia, unspecified: Secondary | ICD-10-CM

## 2017-09-24 DIAGNOSIS — H9190 Unspecified hearing loss, unspecified ear: Secondary | ICD-10-CM | POA: Diagnosis not present

## 2017-09-24 MED ORDER — PEG-KCL-NACL-NASULF-NA ASC-C 140 G PO SOLR: 1.0000 | ORAL | 0 refills | Status: DC

## 2017-09-24 NOTE — Progress Notes (Signed)
Chief Complaint:  Anemia  Referring Provider:      Darron Doom, MD   ASSESSMENT AND PLAN;   92.  57 year old female here for colon cancer screening.  No bowel changes, blood in stool.  Patient is anemic but this is probably multifactorial, see #2. -Patient will be scheduled for colonoscopy with possible polypectomy and/or biopsies.  The risk of the procedures were discussed with the patient through sign language interpreter.  Patient agrees to proceed,  all questions answered -Hold iron 5 days prior to colonoscopy -Patient requests sign language interpreter be present at time of procedure  2 Chronic normocytic anemia, progressive over last few months but that could be combination of chemotherapy for breast cancer and history of menorrhagia.  Gets iron infusion and on oral iron. No overt GI bleeding.  No GI symptoms.  Further evaluation at time of colonoscopy  3.  Left ovarian mass, suspected by GYN to be benign.  Patient is scheduled for hysterectomy/oophorectomy late March.  Colonoscopy will be done 1 month following her last chemotherapy session but prior to her GYN surgery  4.  Deafness.  Sign language interpreter helped with this visit.  She is requesting one for colonoscopy  5.  DM 2 / obesity  HPI:    Patient is a 57 year old female with breast cancer. She is s/p surgery and getting chemotherapy. last infusion will be 09/29/17.  She is referred for colon cancer screening. Oncologist wants her to wait one month after last chemo to get a colonoscopy. Never had a colonoscopy before. Paternal GM had colon cancer in her 30's. BMs are regular, no blood. No abdominal pain. She has occasional GERD sx, not significant enough to take medications. Weight is stable. Hgb is 8.7.    Past Medical History:  Diagnosis Date  . Breast cancer ([place]) 05/2017   left  . Cough 06/03/2017  . Deaf   . Dyspnea    states SOB with ADLs and at night - no home O2  . High cholesterol   . Non-insulin  dependent type 2 diabetes mellitus ([place])   . Runny nose 06/03/2017   clear drainage, per pt.     Past Surgical History:  Procedure Laterality Date  . BREAST LUMPECTOMY WITH RADIOACTIVE SEED AND SENTINEL LYMPH NODE BIOPSY Left 06/09/2017   Procedure: BREAST LUMPECTOMY WITH RADIOACTIVE SEED AND SENTINEL LYMPH NODE BIOPSY;  Surgeon: Excell Seltzer, MD;  Location: [facility];  Service: General;  Laterality: Left;  . PORTACATH PLACEMENT Right 06/09/2017   Procedure: INSERTION PORT-A-CATH WITH [place];  Surgeon: Excell Seltzer, MD;  Location: [facility];  Service: General;  Laterality: Right;  . TUBAL LIGATION  02/04/2002   Family History  Problem Relation Age of Onset  . Hypertension Mother   . Diabetes Father   . Hypertension Father   . Hypertension Sister   . Cancer Paternal Grandmother   . Diabetes Paternal Grandmother   . Colon cancer Paternal Grandmother   . CAD Brother   . Breast cancer Paternal Aunt   . Breast cancer Paternal Aunt    Social History   Tobacco Use  . Smoking status: Never Smoker  . Smokeless tobacco: Never Used  Substance Use Topics  . Alcohol use: No  . Drug use: No   Current Outpatient Medications  Medication Sig Dispense Refill  . atorvastatin (LIPITOR) 40 MG tablet Take 1 tablet (40 mg total) by mouth daily. 90 tablet 1  . BIOTIN 5000 PO Take  by mouth daily.    . Blood Glucose Monitoring Suppl (ACCU-CHEK AVIVA PLUS) W/DEVICE KIT Check Blood sugar AM fasting and before dinner 1 kit 0  . cholecalciferol (VITAMIN D) 1000 units tablet Take 1,000 Units by mouth daily.    Marland Kitchen doxycycline (VIBRA-TABS) 100 MG tablet Take 1 tablet (100 mg total) by mouth 2 (two) times daily. 20 tablet 0  . ferrous sulfate 325 (65 FE) MG tablet TAKE 1 TABLET BY MOUTH EVERY DAY WITH BREAKFAST 60 tablet 2  . glucose blood (ACCU-CHEK AVIVA) test strip Use as instructed 100 each 12  . Lancet Devices (ACCU-CHEK SOFTCLIX) lancets Use as instructed 1 each  0  . lidocaine-prilocaine (EMLA) cream Apply to affected area once 30 g 3  . lisinopril (PRINIVIL,ZESTRIL) 2.5 MG tablet Take 1 tablet (2.5 mg total) by mouth daily. 30 tablet 5  . metFORMIN (GLUCOPHAGE XR) 500 MG 24 hr tablet Take 2 tablets (1,000 mg total) by mouth daily with breakfast. 60 tablet 11  . Omega-3 Fatty Acids (FISH OIL OMEGA-3 PO) Take by mouth.    . TRUEPLUS LANCETS 28G MISC USE AS DIRECTED BY PHYSICIAN 100 each 0   No current facility-administered medications for this visit.    No Known Allergies   Review of Systems: All systems reviewed and negative except where noted in HPI.    Physical Exam:    BP 122/66   Pulse 88   Ht 5' 1"  (1.549 m)   Wt 161 lb (73 kg)   LMP 07/09/2017   BMI 30.42 kg/m  Constitutional:  Well-developed, black female in no acute distress. Psychiatric: Normal mood and affect. Behavior is normal. EENT: Pupils normal.  Conjunctivae are normal. No scleral icterus. Neck supple.  Cardiovascular: Normal rate, regular rhythm. No edema Pulmonary/chest: Effort normal and breath sounds normal. No wheezing, rales or rhonchi. Abdominal: Soft, nondistended. Nontender. Bowel sounds active throughout. There are no masses palpable. No hepatomegaly. Lymphadenopathy: No cervical adenopathy noted. Neurological: Alert and oriented to person place and time. Skin: Skin is warm and dry. No rashes noted.  Tye Savoy, NP  09/24/2017, 2:49 PM   Cc:  Jeannetta Nap, MD

## 2017-09-24 NOTE — Patient Instructions (Addendum)
If you are age 55 or older, your body mass index should be between 23-30. Your Body mass index is 30.42 kg/m. If this is out of the aforementioned range listed, please consider follow up with your Primary Care Provider.  If you are age 76 or younger, your body mass index should be between 19-25. Your Body mass index is 30.42 kg/m. If this is out of the aformentioned range listed, please consider follow up with your Primary Care Provider.   You have been scheduled for a colonoscopy. Please follow written instructions given to you at your visit today.  Please pick up your prep supplies at the pharmacy within the next 1-3 days. If you use inhalers (even only as needed), please bring them with you on the day of your procedure. Your physician has requested that you go to www.startemmi.com and enter the access code given to you at your visit today. This web site gives a general overview about your procedure. However, you should still follow specific instructions given to you by our office regarding your preparation for the procedure. You may have a light breakfast the morning of prep day (the day before the procedure).   You may choose from one of the following items: eggs and toast OR chicken noodle soup and crackers.   You should have your breakfast completed between 8:00 and 9:00 am the day before your procedure.    After you have had your light breakfast you should start a clear liquid diet only, NO SOLIDS. No additional solid food is allowed. You may continue to have clear liquid up to 3 hours prior to your procedure.     We have sent the following medications to your pharmacy for you to pick up at your convenience: [place]  [facility] [place].  Thank you for choosing me and [facility].   Tye Savoy, NP

## 2017-09-26 ENCOUNTER — Encounter: Payer: Self-pay | Admitting: Nurse Practitioner

## 2017-09-27 NOTE — Progress Notes (Signed)
Reviewed and agree with documentation and assessment and plan. K. Veena Crystle Carelli , MD   

## 2017-09-29 ENCOUNTER — Encounter: Payer: Self-pay | Admitting: Adult Health

## 2017-09-29 ENCOUNTER — Inpatient Hospital Stay (HOSPITAL_BASED_OUTPATIENT_CLINIC_OR_DEPARTMENT_OTHER): Payer: Medicaid Other | Admitting: Adult Health

## 2017-09-29 ENCOUNTER — Inpatient Hospital Stay: Payer: Medicaid Other

## 2017-09-29 ENCOUNTER — Telehealth: Payer: Self-pay | Admitting: Adult Health

## 2017-09-29 VITALS — BP 135/74 | HR 87 | Temp 98.0°F | Resp 18 | Ht 61.0 in | Wt 160.0 lb

## 2017-09-29 DIAGNOSIS — Z7984 Long term (current) use of oral hypoglycemic drugs: Secondary | ICD-10-CM

## 2017-09-29 DIAGNOSIS — C50512 Malignant neoplasm of lower-outer quadrant of left female breast: Secondary | ICD-10-CM

## 2017-09-29 DIAGNOSIS — Z800 Family history of malignant neoplasm of digestive organs: Secondary | ICD-10-CM

## 2017-09-29 DIAGNOSIS — Z803 Family history of malignant neoplasm of breast: Secondary | ICD-10-CM

## 2017-09-29 DIAGNOSIS — L739 Follicular disorder, unspecified: Secondary | ICD-10-CM | POA: Diagnosis not present

## 2017-09-29 DIAGNOSIS — T451X5S Adverse effect of antineoplastic and immunosuppressive drugs, sequela: Secondary | ICD-10-CM | POA: Diagnosis not present

## 2017-09-29 DIAGNOSIS — G620 Drug-induced polyneuropathy: Secondary | ICD-10-CM

## 2017-09-29 DIAGNOSIS — R531 Weakness: Secondary | ICD-10-CM | POA: Diagnosis not present

## 2017-09-29 DIAGNOSIS — Z171 Estrogen receptor negative status [ER-]: Principal | ICD-10-CM

## 2017-09-29 DIAGNOSIS — E119 Type 2 diabetes mellitus without complications: Secondary | ICD-10-CM | POA: Diagnosis not present

## 2017-09-29 DIAGNOSIS — N839 Noninflammatory disorder of ovary, fallopian tube and broad ligament, unspecified: Secondary | ICD-10-CM | POA: Diagnosis not present

## 2017-09-29 DIAGNOSIS — R5383 Other fatigue: Secondary | ICD-10-CM | POA: Diagnosis not present

## 2017-09-29 DIAGNOSIS — K219 Gastro-esophageal reflux disease without esophagitis: Secondary | ICD-10-CM

## 2017-09-29 DIAGNOSIS — E7800 Pure hypercholesterolemia, unspecified: Secondary | ICD-10-CM | POA: Diagnosis not present

## 2017-09-29 DIAGNOSIS — Z5112 Encounter for antineoplastic immunotherapy: Secondary | ICD-10-CM | POA: Diagnosis not present

## 2017-09-29 DIAGNOSIS — H9190 Unspecified hearing loss, unspecified ear: Secondary | ICD-10-CM

## 2017-09-29 DIAGNOSIS — Z95828 Presence of other vascular implants and grafts: Secondary | ICD-10-CM

## 2017-09-29 DIAGNOSIS — Z79899 Other long term (current) drug therapy: Secondary | ICD-10-CM

## 2017-09-29 LAB — CBC WITH DIFFERENTIAL/PLATELET
BASOS PCT: 0 %
Basophils Absolute: 0 10*3/uL (ref 0.0–0.1)
Eosinophils Absolute: 0.1 10*3/uL (ref 0.0–0.5)
Eosinophils Relative: 1 %
HCT: 30 % — ABNORMAL LOW (ref 34.8–46.6)
HEMOGLOBIN: 9.4 g/dL — AB (ref 11.6–15.9)
LYMPHS ABS: 2.8 10*3/uL (ref 0.9–3.3)
LYMPHS PCT: 34 %
MCH: 27 pg (ref 25.1–34.0)
MCHC: 31.3 g/dL — AB (ref 31.5–36.0)
MCV: 86.2 fL (ref 79.5–101.0)
MONOS PCT: 8 %
Monocytes Absolute: 0.7 10*3/uL (ref 0.1–0.9)
NEUTROS ABS: 4.7 10*3/uL (ref 1.5–6.5)
NEUTROS PCT: 57 %
Platelets: 304 10*3/uL (ref 145–400)
RBC: 3.48 MIL/uL — ABNORMAL LOW (ref 3.70–5.45)
RDW: 20.4 % — ABNORMAL HIGH (ref 11.2–16.1)
WBC: 8.3 10*3/uL (ref 3.9–10.3)

## 2017-09-29 LAB — COMPREHENSIVE METABOLIC PANEL
ALBUMIN: 3.6 g/dL (ref 3.5–5.0)
ALT: 29 U/L (ref 0–55)
AST: 19 U/L (ref 5–34)
Alkaline Phosphatase: 72 U/L (ref 40–150)
Anion gap: 11 (ref 3–11)
BUN: 12 mg/dL (ref 7–26)
CHLORIDE: 107 mmol/L (ref 98–109)
CO2: 24 mmol/L (ref 22–29)
Calcium: 9.2 mg/dL (ref 8.4–10.4)
Creatinine, Ser: 0.73 mg/dL (ref 0.60–1.10)
GFR calc non Af Amer: >60 mL/min (ref >60)
GLUCOSE: 99 mg/dL (ref 70–140)
POTASSIUM: 4 mmol/L (ref 3.3–4.7)
SODIUM: 142 mmol/L (ref 136–145)
Total Bilirubin: 0.3 mg/dL (ref 0.2–1.2)
Total Protein: 7 g/dL (ref 6.4–8.3)

## 2017-09-29 MED ORDER — ACETAMINOPHEN 325 MG PO TABS
650.0000 mg | ORAL_TABLET | Freq: Once | ORAL | Status: AC
  Administered 2017-09-29: 650 mg via ORAL

## 2017-09-29 MED ORDER — DIPHENHYDRAMINE HCL 50 MG/ML IJ SOLN
25.0000 mg | Freq: Once | INTRAMUSCULAR | Status: AC
  Administered 2017-09-29: 25 mg via INTRAVENOUS

## 2017-09-29 MED ORDER — FERUMOXYTOL INJECTION 510 MG/17 ML
510.0000 mg | Freq: Once | INTRAVENOUS | Status: AC
  Administered 2017-09-29: 510 mg via INTRAVENOUS
  Filled 2017-09-29: qty 17

## 2017-09-29 MED ORDER — FAMOTIDINE IN NACL 20-0.9 MG/50ML-% IV SOLN
20.0000 mg | Freq: Once | INTRAVENOUS | Status: AC
  Administered 2017-09-29: 20 mg via INTRAVENOUS

## 2017-09-29 MED ORDER — SODIUM CHLORIDE 0.9 % IV SOLN
80.0000 mg/m2 | Freq: Once | INTRAVENOUS | Status: AC
  Administered 2017-09-29: 144 mg via INTRAVENOUS
  Filled 2017-09-29: qty 24

## 2017-09-29 MED ORDER — ACETAMINOPHEN 325 MG PO TABS
ORAL_TABLET | ORAL | Status: AC
  Filled 2017-09-29: qty 2

## 2017-09-29 MED ORDER — DIPHENHYDRAMINE HCL 50 MG/ML IJ SOLN
INTRAMUSCULAR | Status: AC
  Filled 2017-09-29: qty 1

## 2017-09-29 MED ORDER — SODIUM CHLORIDE 0.9% FLUSH
10.0000 mL | INTRAVENOUS | Status: DC | PRN
  Administered 2017-09-29: 10 mL
  Filled 2017-09-29: qty 10

## 2017-09-29 MED ORDER — HEPARIN SOD (PORK) LOCK FLUSH 100 UNIT/ML IV SOLN
500.0000 [IU] | Freq: Once | INTRAVENOUS | Status: AC | PRN
  Administered 2017-09-29: 500 [IU]
  Filled 2017-09-29: qty 5

## 2017-09-29 MED ORDER — TRASTUZUMAB CHEMO 150 MG IV SOLR
450.0000 mg | Freq: Once | INTRAVENOUS | Status: AC
  Administered 2017-09-29: 450 mg via INTRAVENOUS
  Filled 2017-09-29: qty 21.43

## 2017-09-29 MED ORDER — SODIUM CHLORIDE 0.9 % IV SOLN
Freq: Once | INTRAVENOUS | Status: AC
  Administered 2017-09-29: 09:00:00 via INTRAVENOUS

## 2017-09-29 MED ORDER — SODIUM CHLORIDE 0.9% FLUSH
10.0000 mL | INTRAVENOUS | Status: DC | PRN
Start: 2017-09-29 — End: 2017-09-29
  Administered 2017-09-29: 10 mL via INTRAVENOUS
  Filled 2017-09-29: qty 10

## 2017-09-29 MED ORDER — SODIUM CHLORIDE 0.9 % IV SOLN
10.0000 mg | Freq: Once | INTRAVENOUS | Status: AC
  Administered 2017-09-29: 10 mg via INTRAVENOUS
  Filled 2017-09-29: qty 1

## 2017-09-29 MED ORDER — FAMOTIDINE IN NACL 20-0.9 MG/50ML-% IV SOLN
INTRAVENOUS | Status: AC
  Filled 2017-09-29: qty 50

## 2017-09-29 NOTE — Assessment & Plan Note (Addendum)
06/09/2017: Left lumpectomy: IDC grade 3, 1.2 cm, DCIS is present, margins negative, 0/6 lymph nodes negative, ER 0%, PR 0%, HER-2 positive ratio 2.28, Ki-67 20%, T1 BN 0 stage IA  Treatment plan: 1. Adjuvant chemotherapy with Taxol Herceptin weekly 12 followed by Herceptin every 3 weeks for total of 6 months 2. Followed by adjuvant radiation  Echo on 08/17/17 demonstrates LVEF 55-60% ____________________________________________________________________  Current treatment: Cycle 12 Taxol Herceptin Chemo toxicities: 1.  GERD: On PPI therapy 2. Acne/folliculitis: Doxycycline BID 3. Mild intermittent peripheral neuropathy: this is table and not worse.  She will complete Taxol today. 4. De Quervain's Tenosynovitis: I recommended Aleve BID x 1 week.  Should it not improve will refer to ortho.   5. Iron deficiency: will receive IV iron today, has upcoming colonoscopy scheduled.  Risks and benefits of iron infusion were reviewed.  Patient and mom understand.  I reviewed the plan with Dr. Gudena and he is aware and in agreement.    Erin [name] will return in 3 weeks for labs, f/u, Trastuzumab. She has upcoming consultation with Dr. Moody on 10/05/2017. 

## 2017-09-29 NOTE — Progress Notes (Signed)
[facility] Follow up:    Erin Spruce, FNP [place] [address]   DIAGNOSIS: Cancer Staging Malignant neoplasm of lower-outer quadrant of left breast of female, estrogen receptor negative ([facility]) Staging form: Breast, AJCC 8th Edition - Clinical stage from 05/18/2017: Stage IA (cT1b, cN0, cM0, G3, ER: Negative, PR: Negative, HER2: Positive) - Unsigned - Pathologic: Stage IA (pT1c, pN0, cM0, G3, ER: Negative, PR: Negative, HER2: Positive) - Signed by [name], NP on 07/07/2017   SUMMARY OF ONCOLOGIC HISTORY:   Malignant neoplasm of lower-outer quadrant of left breast of female, estrogen receptor negative ([facility])   05/11/2017 Initial Diagnosis    Left breast biopsy 3:30 position 6 cm from nipple: IDC with DCIS, lymphovascular invasion present, grade 2-3, ER 0%, PR 0%, HER-2 positive ratio 2.28, Ki-67 20%, 1 cm lesion left breast, T1b N0 stage IA clinical stage      06/09/2017 Surgery    Left lumpectomy: IDC grade 3, 1.2 cm, DCIS is present, margins negative, 0/6 lymph nodes negative, ER 0%, PR 0%, HER-2 positive ratio 2.28, Ki-67 20%, T1 BN 0 stage IA      07/09/2017 -  Adjuvant Chemotherapy    Taxol/Herceptin       CURRENT THERAPY: Taxol/Herceptin  INTERVAL HISTORY: Erin [name] 57 y.o. female returns for evaluation prior to receiving her last weekly Taxol and Herceptin.  She is accompanied by her mom and a sign language interpreter since she is deaf.  She will also receive IV iron today.  She is doing well.  She has continued mild intermittent peripheral neuropathy.  It is not interfering with her activities of daily living.  She also has some left thumb pain that extends down to the wrist.  She denies any precipitating factor.   Patient Active Problem List   Diagnosis Date Noted  . Hyperlipidemia associated with type 2 diabetes mellitus ([facility]) 08/27/2017  . Ovarian mass, left 08/26/2017  . Menorrhagia 08/26/2017  .  Port-A-Cath in place 07/23/2017  . Malignant neoplasm of lower-outer quadrant of left breast of female, estrogen receptor negative ([place] [facility]) 05/13/2017  . Abdominal bloating 04/02/2017  . Chronic right-sided low back pain without sciatica 07/02/2016  . DM type 2 (diabetes mellitus, type 2) ([facility]) 11/18/2014  . Intermittent constipation 06/06/2010  . UNSPECIFIED IRON DEFICIENCY ANEMIA 04/22/2010  . DEPRESSION 04/10/2010  . DEAFNESS 04/10/2010    has No Known Allergies.  MEDICAL HISTORY: Past Medical History:  Diagnosis Date  . Breast cancer ([facility]) 05/2017   left  . Cough 06/03/2017  . Deaf   . Dyspnea    states SOB with ADLs and at night - no home O2  . High cholesterol   . Non-insulin dependent type 2 diabetes mellitus ([facility] [facility])   . Runny nose 06/03/2017   clear drainage, per pt.    SURGICAL HISTORY: Past Surgical History:  Procedure Laterality Date  . BREAST LUMPECTOMY WITH RADIOACTIVE SEED AND SENTINEL LYMPH NODE BIOPSY Left 06/09/2017   Procedure: BREAST LUMPECTOMY WITH RADIOACTIVE SEED AND SENTINEL LYMPH NODE BIOPSY;  Surgeon: Excell Seltzer, MD;  Location: [facility];  Service: General;  Laterality: Left;  . PORTACATH PLACEMENT Right 06/09/2017   Procedure: INSERTION PORT-A-CATH WITH [place];  Surgeon: Excell Seltzer, MD;  Location: [facility];  Service: General;  Laterality: Right;  . TUBAL LIGATION  02/04/2002    SOCIAL HISTORY: Social History   Socioeconomic History  . Marital status: Legally Separated    Spouse  name: Not on file  . Number of children: Not on file  . Years of education: Not on file  . Highest education level: Not on file  Social Needs  . Financial resource strain: Not on file  . Food insecurity - worry: Not on file  . Food insecurity - inability: Not on file  . Transportation needs - medical: Not on file  . Transportation needs - non-medical: Not on file  Occupational History  . Not on file  Tobacco Use  . Smoking  status: Never Smoker  . Smokeless tobacco: Never Used  Substance and Sexual Activity  . Alcohol use: No  . Drug use: No  . Sexual activity: No  Other Topics Concern  . Not on file  Social History Narrative  . Not on file    FAMILY HISTORY: Family History  Problem Relation Age of Onset  . Hypertension Mother   . Diabetes Father   . Hypertension Father   . Hypertension Sister   . Cancer Paternal Grandmother   . Diabetes Paternal Grandmother   . Colon cancer Paternal Grandmother   . CAD Brother   . Breast cancer Paternal Aunt   . Breast cancer Paternal Aunt     Review of Systems  Constitutional: Negative for appetite change, chills, fatigue, fever and unexpected weight change.  HENT:   Negative for hearing loss, lump/mass and trouble swallowing.   Eyes: Negative for eye problems and icterus.  Respiratory: Negative for chest tightness, cough and shortness of breath.   Cardiovascular: Negative for chest pain, leg swelling and palpitations.  Gastrointestinal: Negative for abdominal distention, abdominal pain, constipation, diarrhea, nausea and vomiting.  Endocrine: Negative for hot flashes.  Musculoskeletal: Negative for arthralgias.  Skin: Negative for itching and rash.  Neurological: Negative for dizziness, extremity weakness and numbness.  Hematological: Negative for adenopathy. Does not bruise/bleed easily.  Psychiatric/Behavioral: Negative for depression. The patient is not nervous/anxious.       PHYSICAL EXAMINATION  ECOG PERFORMANCE STATUS: 1 - Symptomatic but completely ambulatory  Vitals:   09/29/17 0811  BP: 135/74  Pulse: 87  Resp: 18  Temp: 98 F (36.7 C)  SpO2: 100%    Physical Exam  Constitutional: She is oriented to person, place, and time and well-developed, well-nourished, and in no distress.  HENT:  Head: Normocephalic and atraumatic.  Mouth/Throat: Oropharynx is clear and moist. No oropharyngeal exudate.  Eyes: Pupils are equal, round, and  reactive to light. No scleral icterus.  Neck: Neck supple.  Cardiovascular: Normal rate, regular rhythm and normal heart sounds.  Pulmonary/Chest: Effort normal and breath sounds normal. No respiratory distress. She has no wheezes. She has no rales.  Abdominal: Soft. Bowel sounds are normal. She exhibits no distension and no mass. There is no tenderness. There is no rebound and no guarding.  Musculoskeletal: She exhibits no edema.  + finklestein's test, + tenderness on left medial wrist and lower left first carpal   Lymphadenopathy:    She has no cervical adenopathy.  Neurological: She is oriented to person, place, and time.  Skin: Skin is warm and dry. No rash noted.  Psychiatric: Mood and affect normal.    LABORATORY DATA:  CBC    Component Value Date/Time   WBC 8.3 09/29/2017 0741   RBC 3.48 (L) 09/29/2017 0741   HGB 9.4 (L) 09/29/2017 0741   HGB 9.3 (L) 09/15/2017 0800   HCT 30.0 (L) 09/29/2017 0741   HCT 28.2 (L) 09/15/2017 0800   PLT 304 09/29/2017 0741  PLT 357 09/15/2017 0800   PLT 375 05/26/2017 0918   MCV 86.2 09/29/2017 0741   MCV 82.4 09/15/2017 0800   MCH 27.0 09/29/2017 0741   MCHC 31.3 (L) 09/29/2017 0741   RDW 20.4 (H) 09/29/2017 0741   RDW 20.8 (H) 09/15/2017 0800   LYMPHSABS 2.8 09/29/2017 0741   LYMPHSABS 2.5 09/15/2017 0800   MONOABS 0.7 09/29/2017 0741   MONOABS 0.8 09/15/2017 0800   EOSABS 0.1 09/29/2017 0741   EOSABS 0.1 09/15/2017 0800   EOSABS 0.1 05/26/2017 0918   BASOSABS 0.0 09/29/2017 0741   BASOSABS 0.0 09/15/2017 0800    CMP     Component Value Date/Time   NA 142 09/29/2017 0741   NA 139 09/15/2017 0800   K 4.0 09/29/2017 0741   K 3.8 09/15/2017 0800   CL 107 09/29/2017 0741   CO2 24 09/29/2017 0741   CO2 23 09/15/2017 0800   GLUCOSE 99 09/29/2017 0741   GLUCOSE 113 09/15/2017 0800   BUN 12 09/29/2017 0741   BUN 8.7 09/15/2017 0800   CREATININE 0.73 09/29/2017 0741   CREATININE 0.7 09/15/2017 0800   CALCIUM 9.2 09/29/2017  0741   CALCIUM 9.2 09/15/2017 0800   PROT 7.0 09/29/2017 0741   PROT 7.3 09/15/2017 0800   ALBUMIN 3.6 09/29/2017 0741   ALBUMIN 3.3 (L) 09/15/2017 0800   AST 19 09/29/2017 0741   AST 13 09/15/2017 0800   ALT 29 09/29/2017 0741   ALT 19 09/15/2017 0800   ALKPHOS 72 09/29/2017 0741   ALKPHOS 74 09/15/2017 0800   BILITOT 0.3 09/29/2017 0741   BILITOT 0.27 09/15/2017 0800   GFRNONAA >60 09/29/2017 0741   GFRAA >60 09/29/2017 0741           ASSESSMENT and PLAN:   Malignant neoplasm of lower-outer quadrant of left breast of female, estrogen receptor negative ([facility]) 06/09/2017: Left lumpectomy: IDC grade 3, 1.2 cm, DCIS is present, margins negative, 0/6 lymph nodes negative, ER 0%, PR 0%, HER-2 positive ratio 2.28, Ki-67 20%, T1 BN 0 stage IA  Treatment plan: 1. Adjuvant chemotherapy with Taxol Herceptin weekly 12 followed by Herceptin every 3 weeks for total of 6 months 2. Followed by adjuvant radiation  Echo on 08/17/17 demonstrates LVEF 55-60% ____________________________________________________________________  Current treatment: Cycle 12 Taxol Herceptin Chemo toxicities: 1.  GERD: On PPI therapy 2. Acne/folliculitis: Doxycycline BID 3. Mild intermittent peripheral neuropathy: this is table and not worse.  She will complete Taxol today. 4. De Quervain's Tenosynovitis: I recommended Aleve BID x 1 week.  Should it not improve will refer to ortho.   5. Iron deficiency: will receive IV iron today, has upcoming colonoscopy scheduled.  Risks and benefits of iron infusion were reviewed.  Patient and mom understand.  I reviewed the plan with Dr. [name] and he is aware and in agreement.    Deshea will return in 3 weeks for labs, f/u, Trastuzumab. She has upcoming consultation with Dr. Lisbeth Renshaw on 10/05/2017.  All questions were answered. The patient knows to call the clinic with any problems, questions or concerns. We can certainly see the patient much sooner if necessary.  A  total of (30) minutes of face-to-face time was spent with this patient with greater than 50% of that time in counseling and care-coordination.   This note was electronically signed. Scot Dock, NP 09/29/2017

## 2017-09-29 NOTE — Patient Instructions (Signed)
[facility] Discharge Instructions for Patients Receiving Chemotherapy  Today you received the following chemotherapy agents Herceptin; Paclitaxel  To help prevent nausea and vomiting after your treatment, we encourage you to take your nausea medication as directed   If you develop nausea and vomiting that is not controlled by your nausea medication, call the clinic.   BELOW ARE SYMPTOMS THAT SHOULD BE REPORTED IMMEDIATELY:  *FEVER GREATER THAN 100.5 F  *CHILLS WITH OR WITHOUT FEVER  NAUSEA AND VOMITING THAT IS NOT CONTROLLED WITH YOUR NAUSEA MEDICATION  *UNUSUAL SHORTNESS OF BREATH  *UNUSUAL BRUISING OR BLEEDING  TENDERNESS IN MOUTH AND THROAT WITH OR WITHOUT PRESENCE OF ULCERS  *URINARY PROBLEMS  *BOWEL PROBLEMS  UNUSUAL RASH Items with * indicate a potential emergency and should be followed up as soon as possible.  Feel free to call the clinic should you have any questions or concerns. The clinic phone number is [phone].  Please show the [facility] at check-in to the Emergency Department and triage nurse.  Ferumoxytol injection What is this medicine? FERUMOXYTOL is an iron complex. Iron is used to make healthy red blood cells, which carry oxygen and nutrients throughout the body. This medicine is used to treat iron deficiency anemia in people with chronic kidney disease. This medicine may be used for other purposes; ask your health care provider or pharmacist if you have questions. COMMON BRAND NAME(S): Feraheme What should I tell my health care provider before I take this medicine? They need to know if you have any of these conditions: -anemia not caused by low iron levels -high levels of iron in the blood -magnetic resonance imaging (MRI) test scheduled -an unusual or allergic reaction to iron, other medicines, foods, dyes, or preservatives -pregnant or trying to get pregnant -breast-feeding How should I use this medicine? This medicine  is for injection into a vein. It is given by a health care professional in a hospital or clinic setting. Talk to your pediatrician regarding the use of this medicine in children. Special care may be needed. Overdosage: If you think you have taken too much of this medicine contact a poison control center or emergency room at once. NOTE: This medicine is only for you. Do not share this medicine with others. What if I miss a dose? It is important not to miss your dose. Call your doctor or health care professional if you are unable to keep an appointment. What may interact with this medicine? This medicine may interact with the following medications: -other iron products This list may not describe all possible interactions. Give your health care provider a list of all the medicines, herbs, non-prescription drugs, or dietary supplements you use. Also tell them if you smoke, drink alcohol, or use illegal drugs. Some items may interact with your medicine. What should I watch for while using this medicine? Visit your doctor or healthcare professional regularly. Tell your doctor or healthcare professional if your symptoms do not start to get better or if they get worse. You may need blood work done while you are taking this medicine. You may need to follow a special diet. Talk to your doctor. Foods that contain iron include: whole grains/cereals, dried fruits, beans, or peas, leafy green vegetables, and organ meats (liver, kidney). What side effects may I notice from receiving this medicine? Side effects that you should report to your doctor or health care professional as soon as possible: -allergic reactions like skin rash, itching or hives, swelling of the  face, lips, or tongue -breathing problems -changes in blood pressure -feeling faint or lightheaded, falls -fever or chills -flushing, sweating, or hot feelings -swelling of the ankles or feet Side effects that usually do not require medical  attention (report to your doctor or health care professional if they continue or are bothersome): -diarrhea -headache -nausea, vomiting -stomach pain This list may not describe all possible side effects. Call your doctor for medical advice about side effects. You may report side effects to FDA at 1-800-FDA-1088. Where should I keep my medicine? This drug is given in a hospital or clinic and will not be stored at home. NOTE: This sheet is a summary. It may not cover all possible information. If you have questions about this medicine, talk to your doctor, pharmacist, or health care provider.  2018 Elsevier/Gold Standard (2015-10-03 12:41:49)

## 2017-09-29 NOTE — Telephone Encounter (Signed)
Gave patient avs and calendar with appts per 1/16 los. Put march appt on a Tuesday due to VG being in breast clinic on weds.

## 2017-10-05 ENCOUNTER — Encounter: Payer: Self-pay | Admitting: Radiation Oncology

## 2017-10-05 ENCOUNTER — Ambulatory Visit
Admission: RE | Admit: 2017-10-05 | Discharge: 2017-10-05 | Disposition: A | Discharge status: Home / Self Care | Payer: Medicaid Other | Source: Ambulatory Visit | Attending: Radiation Oncology | Admitting: Radiation Oncology

## 2017-10-05 ENCOUNTER — Ambulatory Visit
Admission: RE | Admit: 2017-10-05 | Discharge: 2017-10-05 | Disposition: A | Discharge status: Home / Self Care | Payer: Self-pay | Source: Ambulatory Visit | Attending: Radiation Oncology | Admitting: Radiation Oncology

## 2017-10-05 ENCOUNTER — Other Ambulatory Visit: Payer: Self-pay

## 2017-10-05 VITALS — BP 140/76 | HR 93 | Temp 97.6°F | Resp 18 | Ht 61.0 in | Wt 160.6 lb

## 2017-10-05 DIAGNOSIS — C50512 Malignant neoplasm of lower-outer quadrant of left female breast: Secondary | ICD-10-CM

## 2017-10-05 DIAGNOSIS — Z79899 Other long term (current) drug therapy: Secondary | ICD-10-CM | POA: Insufficient documentation

## 2017-10-05 DIAGNOSIS — Z171 Estrogen receptor negative status [ER-]: Secondary | ICD-10-CM | POA: Diagnosis not present

## 2017-10-05 DIAGNOSIS — E119 Type 2 diabetes mellitus without complications: Secondary | ICD-10-CM | POA: Diagnosis not present

## 2017-10-05 DIAGNOSIS — Z800 Family history of malignant neoplasm of digestive organs: Secondary | ICD-10-CM | POA: Diagnosis not present

## 2017-10-05 DIAGNOSIS — Z803 Family history of malignant neoplasm of breast: Secondary | ICD-10-CM | POA: Diagnosis not present

## 2017-10-05 DIAGNOSIS — E7800 Pure hypercholesterolemia, unspecified: Secondary | ICD-10-CM | POA: Diagnosis not present

## 2017-10-05 DIAGNOSIS — Z7984 Long term (current) use of oral hypoglycemic drugs: Secondary | ICD-10-CM | POA: Diagnosis not present

## 2017-10-05 DIAGNOSIS — R0602 Shortness of breath: Secondary | ICD-10-CM | POA: Diagnosis not present

## 2017-10-05 DIAGNOSIS — Z809 Family history of malignant neoplasm, unspecified: Secondary | ICD-10-CM | POA: Diagnosis not present

## 2017-10-05 NOTE — Progress Notes (Signed)
See progress note under physician encounter. 

## 2017-10-05 NOTE — Progress Notes (Signed)
Radiation Oncology         [phone] ________________________________  Name: Erin [name]        MRN: [id number]  Date of Service: 10/05/2017 DOB: 02/27/1961  WE:[id number], Maylon Peppers, FNP  Excell Seltzer, MD     REFERRING PHYSICIAN: Excell Seltzer, MD   DIAGNOSIS: The encounter diagnosis was Malignant neoplasm of lower-outer quadrant of left breast of female, estrogen receptor negative ([place]).   HISTORY OF PRESENT ILLNESS: Erin [name] is a 57 y.o. female who was originally seen in the multidisciplinary breast clinic for a new diagnosis of a left breast cancer. The patient was found to have assymmetry of the left breast seen on screening mammogram.Diagnostic imaging on 05/04/17 revealed a 10 x 8 mm lesion in the 3:30 position of the left breast. An ultrasound of the axilla was negative for adenopathy. 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 The tumor was ER/PR negative, and HER2 was amplified at 2.28, Ki 67 was 20%. 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 All 6 of her sampled nodes were negative. Her DCIS came 2 mm from the anterior margin, and her invasive disease came to 1 mm from the anterior margin as well. She went on to complete adjuvant taxol/herceptin which she started on 07/09/17. She has completed the Taxol portion and will continue for 6 months of Herceptin. She is undergoing vaginal hysterectomy on 11/04/17 with Dr. Kennon Rounds and comes today to discuss the role of adjuvant radiotherapy  PREVIOUS RADIATION THERAPY: No  PAST MEDICAL HISTORY:  Past Medical History:  Diagnosis Date  . Breast cancer ([place]) 05/2017   left  . Cough 06/03/2017  . Deaf   . Dyspnea    states SOB with ADLs and at night - no home O2  . High cholesterol   . Non-insulin dependent type 2 diabetes mellitus ([place])   . Runny nose 06/03/2017   clear drainage, per pt.   PAST SURGICAL HISTORY: Past Surgical History:  Procedure Laterality Date  . BREAST LUMPECTOMY WITH RADIOACTIVE SEED AND SENTINEL LYMPH NODE BIOPSY Left 06/09/2017   Procedure: BREAST LUMPECTOMY WITH RADIOACTIVE SEED AND SENTINEL LYMPH NODE BIOPSY;  Surgeon: Excell Seltzer, MD;  Location: [facility];  Service: General;  Laterality: Left;  . PORTACATH PLACEMENT Right 06/09/2017   Procedure: INSERTION PORT-A-CATH WITH [place];  Surgeon: Excell Seltzer, MD;  Location: [facility];  Service: General;  Laterality: Right;  . TUBAL LIGATION  02/04/2002     FAMILY HISTORY:  Family History  Problem Relation Age of Onset  . Hypertension Mother   . Diabetes Father   . Hypertension Father   . Hypertension Sister   . Cancer Paternal Grandmother   . Diabetes Paternal Grandmother   . Colon cancer Paternal Grandmother   . CAD Brother   . Breast cancer Paternal Aunt   . Breast cancer Paternal Aunt      SOCIAL HISTORY:  reports that  has never smoked. she has never used smokeless tobacco. She reports that she does not drink alcohol or use drugs. The patient is [place] and [place]. She is currently unemployed. 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   ALLERGIES: Patient has no known allergies.   MEDICATIONS:  Current Outpatient Medications  Medication Sig Dispense Refill  . atorvastatin (LIPITOR) 40 MG  tablet Take 1 tablet (40 mg total) by mouth daily. 90 tablet 1  . BIOTIN 5000 PO Take by mouth daily.    . Blood Glucose Monitoring Suppl (ACCU-CHEK AVIVA PLUS) W/DEVICE KIT Check Blood sugar AM fasting and before dinner 1 kit 0  . cholecalciferol (VITAMIN D) 1000 units tablet Take 1,000 Units by mouth daily.    . ferrous sulfate 325 (65 FE) MG tablet TAKE 1 TABLET BY MOUTH EVERY DAY WITH BREAKFAST 60 tablet 2  . glucose blood (ACCU-CHEK AVIVA) test strip Use as instructed 100 each 12  . Lancet Devices  (ACCU-CHEK SOFTCLIX) lancets Use as instructed 1 each 0  . lidocaine-prilocaine (EMLA) cream Apply to affected area once 30 g 3  . lisinopril (PRINIVIL,ZESTRIL) 2.5 MG tablet Take 1 tablet (2.5 mg total) by mouth daily. 30 tablet 5  . metFORMIN (GLUCOPHAGE XR) 500 MG 24 hr tablet Take 2 tablets (1,000 mg total) by mouth daily with breakfast. 60 tablet 11  . Omega-3 Fatty Acids (FISH OIL OMEGA-3 PO) Take by mouth.    Marland Kitchen PEG-KCl-NaCl-NaSulf-Na Asc-C (PLENVU) 140 g SOLR Take 1 kit by mouth as directed. 1 each 0  . TRUEPLUS LANCETS 28G MISC USE AS DIRECTED BY PHYSICIAN 100 each 0   No current facility-administered medications for this encounter.      REVIEW OF SYSTEMS:On review of systems, the patient reports that she is doing well overall. She reports occasional neuropathic pain in her fingertips. She denies any chest pain, shortness of breath, cough, fevers, chills, night sweats, unintended weight changes. She denies any bowel or bladder disturbances, and denies abdominal pain, nausea or vomiting. She denies any new musculoskeletal or joint aches or pains, new skin lesions or concerns. A complete review of systems is obtained and is otherwise negative.      PHYSICAL EXAM:  Wt Readings from Last 3 Encounters:  10/05/17 160 lb 9.6 oz (72.8 kg)  09/29/17 160 lb (72.6 kg)  09/24/17 161 lb (73 kg)   Temp Readings from Last 3 Encounters:  10/05/17 97.6 F (36.4 C) (Oral)  09/29/17 98 F (36.7 C) (Oral)  09/22/17 98.4 F (36.9 C) (Oral)   BP Readings from Last 3 Encounters:  10/05/17 140/76  09/29/17 135/74  09/24/17 122/66   Pulse Readings from Last 3 Encounters:  10/05/17 93  09/29/17 87  09/24/17 88   Pain Assessment Pain Score: 0-No pain In general this is a well appearing African American female in no acute distress. She is alert and oriented x4 and appropriate throughout the examination. HEENT reveals that the patient is normocephalic, atraumatic. EOMs are intact. PERRLA.   Cardiopulmonary assessment is negative for acute distress and she exhibits normal effort. Breast exam is deferred to the time of her simulation.   ECOG = 0  0 - Asymptomatic (Fully active, able to carry on all predisease activities without restriction)  1 - Symptomatic but completely ambulatory (Restricted in physically strenuous activity but ambulatory and able to carry out work of a light or sedentary nature. For example, light housework, office work)  2 - Symptomatic, <50% in bed during the day (Ambulatory and capable of all self care but unable to carry out any work activities. Up and about more than 50% of waking hours)  3 - Symptomatic, >50% in bed, but not bedbound (Capable of only limited self-care, confined to bed or chair 50% or more of waking hours)  4 - Bedbound (Completely disabled. Cannot carry on any self-care. Totally confined to bed or  chair)  5 - Death   Eustace Pen MM, Creech RH, Tormey DC, et al. [phone]). "Toxicity and response criteria of the [facility]". [place] Oncol. 5 (6): 649-55    LABORATORY DATA:  Lab Results  Component Value Date   WBC 8.3 09/29/2017   HGB 9.4 (L) 09/29/2017   HCT 30.0 (L) 09/29/2017   MCV 86.2 09/29/2017   PLT 304 09/29/2017   Lab Results  Component Value Date   NA 142 09/29/2017   K 4.0 09/29/2017   CL 107 09/29/2017   CO2 24 09/29/2017   Lab Results  Component Value Date   ALT 29 09/29/2017   AST 19 09/29/2017   ALKPHOS 72 09/29/2017   BILITOT 0.3 09/29/2017      RADIOGRAPHY: Mr Pelvis W Wo Contrast  Result Date: 09/06/2017 CLINICAL DATA:  Complex cystic lesion of the LEFT adnexa. Incidental finding. Menorrhagia EXAM: MRI PELVIS WITHOUT AND WITH CONTRAST TECHNIQUE: Multiplanar multisequence MR imaging of the pelvis was performed both before and after administration of intravenous contrast. CONTRAST:  63m MULTIHANCE GADOBENATE DIMEGLUMINE 529 MG/ML IV SOLN COMPARISON:  CT 12/22/2005, ultrasound  10/30/2016 FINDINGS: Urinary Tract: Normal bladder distal ureters and inferior kidneys are normal. Bowel:  Limited view of the bowel is unremarkable. Vascular/Lymphatic: Abdominal or is normal. No pelvic lymphadenopathy. Reproductive: Uterus is normal size measuring 7.5 x 5.0 x 4.5 cm. The endometrium is thin at 1 mm. There is uniform thickening of the junctional zone to 16 mm in greatest thickness (image 13, series 7). There is no crypts within the thickened junctional zone on T2 weighted imaging. Findings are not typical for adenomyosis and favor contraction or benign thickening. There is a bilobed cystic lesion presumably originating from the LEFT adnexa in comparison to recent pelvic ultrasound. This cystic complex measures 12.8 x 10.9 9 x 8.0 cm. Bilobed cystic complex is positioned anterior to the uteruswith homogeneous high signal intensity T2 weighted imaging and low on T1 weighting imaging without post-contrast enhancement. There is a intervening cyst wall without nodularity. No nodularity within the cyst walls peripherally. The RIGHT ovary is not identified however appear normal on comparison ultrasound. Other:  No free-fluid.  No peritoneal nodularity Musculoskeletal: No aggressive osseous lesion IMPRESSION: 1. Large bilobed cyst originating from the LEFT ovary by correlation with recent ultrasound. Cysts has no worrisome features; however, the large size warrants GYN surgical evaluation. 2. LEFT and RIGHT ovary are not well demonstrated however a normal RIGHT ovary was identified on comparison ultrasound. 3. Diffusely thickened junctional zone without bright crypts suggests myometrial contraction rather than adenomyosis. These results will be called to the ordering clinician or representative by the Radiologist Assistant, and communication documented in the PACS or zVision Dashboard. Electronically Signed   By: SSuzy BouchardM.D.   On: 09/06/2017 08:58       IMPRESSION/PLAN: 1. Stage IA, pT1cN0M0  grade 3, ER/PR negative, HER2 amplified, invasive ductal carcinoma with DCIS of the left breast. We met today and reviewed her pathology and recent course. Dr. MLisbeth Renshawrecommends proceeding with adjuvant radiotherapy and we detailed the rationale. Dr. MLisbeth Renshawdiscusses the delivery and logistics of radiotherapy with 6 1/2 weeks of treamtent with the possibility of deep inspiration breath hold technique. She would like to start after her upcoming hysterectomy and we will coordinate for simulation the first week of March. Our staff will call her to coordinate this.   In a visit lasting 25 minutes, greater than 50% of the time was spent face  to face discussing her course, and coordinating the patient's care.  The above documentation reflects my direct findings during this shared patient visit. Please see the separate note by Dr. Lisbeth Renshaw on this date for the remainder of the patient's plan of care.    Carola Rhine, PAC

## 2017-10-20 ENCOUNTER — Inpatient Hospital Stay: Payer: Medicaid Other

## 2017-10-20 ENCOUNTER — Inpatient Hospital Stay: Payer: Medicaid Other | Attending: Hematology and Oncology | Admitting: Adult Health

## 2017-10-20 ENCOUNTER — Encounter: Payer: Self-pay | Admitting: Adult Health

## 2017-10-20 VITALS — BP 125/74 | HR 91 | Temp 97.9°F | Resp 18 | Ht 61.0 in | Wt 161.1 lb

## 2017-10-20 DIAGNOSIS — Z171 Estrogen receptor negative status [ER-]: Principal | ICD-10-CM

## 2017-10-20 DIAGNOSIS — Z5112 Encounter for antineoplastic immunotherapy: Secondary | ICD-10-CM | POA: Diagnosis not present

## 2017-10-20 DIAGNOSIS — Z95828 Presence of other vascular implants and grafts: Secondary | ICD-10-CM

## 2017-10-20 DIAGNOSIS — C50512 Malignant neoplasm of lower-outer quadrant of left female breast: Secondary | ICD-10-CM

## 2017-10-20 LAB — CBC WITH DIFFERENTIAL/PLATELET
Basophils Absolute: 0 10*3/uL (ref 0.0–0.1)
Basophils Relative: 0 %
EOS ABS: 0.1 10*3/uL (ref 0.0–0.5)
Eosinophils Relative: 1 %
HEMATOCRIT: 31.3 % — AB (ref 34.8–46.6)
HEMOGLOBIN: 10.2 g/dL — AB (ref 11.6–15.9)
LYMPHS ABS: 2.9 10*3/uL (ref 0.9–3.3)
Lymphocytes Relative: 25 %
MCH: 28.2 pg (ref 25.1–34.0)
MCHC: 32.5 g/dL (ref 31.5–36.0)
MCV: 86.8 fL (ref 79.5–101.0)
Monocytes Absolute: 1.3 10*3/uL — ABNORMAL HIGH (ref 0.1–0.9)
Monocytes Relative: 11 %
Neutro Abs: 7.2 10*3/uL — ABNORMAL HIGH (ref 1.5–6.5)
Neutrophils Relative %: 63 %
Platelets: 265 10*3/uL (ref 145–400)
RBC: 3.61 MIL/uL — AB (ref 3.70–5.45)
RDW: 24.7 % — ABNORMAL HIGH (ref 11.2–14.5)
WBC: 11.5 10*3/uL — AB (ref 3.9–10.3)

## 2017-10-20 LAB — COMPREHENSIVE METABOLIC PANEL
ALT: 17 U/L (ref 0–55)
ANION GAP: 11 (ref 3–11)
AST: 14 U/L (ref 5–34)
Albumin: 3.4 g/dL — ABNORMAL LOW (ref 3.5–5.0)
Alkaline Phosphatase: 83 U/L (ref 40–150)
BUN: 8 mg/dL (ref 7–26)
CHLORIDE: 103 mmol/L (ref 98–109)
CO2: 25 mmol/L (ref 22–29)
CREATININE: 0.72 mg/dL (ref 0.60–1.10)
Calcium: 9.5 mg/dL (ref 8.4–10.4)
Glucose, Bld: 126 mg/dL (ref 70–140)
Potassium: 4.1 mmol/L (ref 3.5–5.1)
SODIUM: 139 mmol/L (ref 136–145)
Total Bilirubin: 0.3 mg/dL (ref 0.2–1.2)
Total Protein: 7.3 g/dL (ref 6.4–8.3)

## 2017-10-20 MED ORDER — ACETAMINOPHEN 325 MG PO TABS
650.0000 mg | ORAL_TABLET | Freq: Once | ORAL | Status: AC
  Administered 2017-10-20: 650 mg via ORAL

## 2017-10-20 MED ORDER — SODIUM CHLORIDE 0.9% FLUSH
10.0000 mL | INTRAVENOUS | Status: DC | PRN
  Administered 2017-10-20: 10 mL
  Filled 2017-10-20: qty 10

## 2017-10-20 MED ORDER — ACETAMINOPHEN 325 MG PO TABS
ORAL_TABLET | ORAL | Status: AC
  Filled 2017-10-20: qty 2

## 2017-10-20 MED ORDER — DIPHENHYDRAMINE HCL 25 MG PO CAPS
50.0000 mg | ORAL_CAPSULE | Freq: Once | ORAL | Status: AC
  Administered 2017-10-20: 50 mg via ORAL

## 2017-10-20 MED ORDER — HEPARIN SOD (PORK) LOCK FLUSH 100 UNIT/ML IV SOLN
500.0000 [IU] | Freq: Once | INTRAVENOUS | Status: AC | PRN
  Administered 2017-10-20: 500 [IU]
  Filled 2017-10-20: qty 5

## 2017-10-20 MED ORDER — DIPHENHYDRAMINE HCL 25 MG PO CAPS
ORAL_CAPSULE | ORAL | Status: AC
  Filled 2017-10-20: qty 2

## 2017-10-20 MED ORDER — SODIUM CHLORIDE 0.9 % IV SOLN
Freq: Once | INTRAVENOUS | Status: AC
  Administered 2017-10-20: 10:00:00 via INTRAVENOUS

## 2017-10-20 MED ORDER — TRASTUZUMAB CHEMO 150 MG IV SOLR
450.0000 mg | Freq: Once | INTRAVENOUS | Status: AC
  Administered 2017-10-20: 450 mg via INTRAVENOUS
  Filled 2017-10-20: qty 21.43

## 2017-10-20 NOTE — Patient Instructions (Signed)
[facility] Discharge Instructions for Patients Receiving Chemotherapy  Today you received the following chemotherapy agents Herceptin.  To help prevent nausea and vomiting after your treatment, we encourage you to take your nausea medication as directed   If you develop nausea and vomiting that is not controlled by your nausea medication, call the clinic.   BELOW ARE SYMPTOMS THAT SHOULD BE REPORTED IMMEDIATELY:  *FEVER GREATER THAN 100.5 F  *CHILLS WITH OR WITHOUT FEVER  NAUSEA AND VOMITING THAT IS NOT CONTROLLED WITH YOUR NAUSEA MEDICATION  *UNUSUAL SHORTNESS OF BREATH  *UNUSUAL BRUISING OR BLEEDING  TENDERNESS IN MOUTH AND THROAT WITH OR WITHOUT PRESENCE OF ULCERS  *URINARY PROBLEMS  *BOWEL PROBLEMS  UNUSUAL RASH Items with * indicate a potential emergency and should be followed up as soon as possible.  Feel free to call the clinic should you have any questions or concerns. The clinic phone number is [phone].  Please show the [facility] at check-in to the Emergency Department and triage nurse.  Ferumoxytol injection What is this medicine? FERUMOXYTOL is an iron complex. Iron is used to make healthy red blood cells, which carry oxygen and nutrients throughout the body. This medicine is used to treat iron deficiency anemia in people with chronic kidney disease. This medicine may be used for other purposes; ask your health care provider or pharmacist if you have questions. COMMON BRAND NAME(S): Feraheme What should I tell my health care provider before I take this medicine? They need to know if you have any of these conditions: -anemia not caused by low iron levels -high levels of iron in the blood -magnetic resonance imaging (MRI) test scheduled -an unusual or allergic reaction to iron, other medicines, foods, dyes, or preservatives -pregnant or trying to get pregnant -breast-feeding How should I use this medicine? This medicine is for  injection into a vein. It is given by a health care professional in a hospital or clinic setting. Talk to your pediatrician regarding the use of this medicine in children. Special care may be needed. Overdosage: If you think you have taken too much of this medicine contact a poison control center or emergency room at once. NOTE: This medicine is only for you. Do not share this medicine with others. What if I miss a dose? It is important not to miss your dose. Call your doctor or health care professional if you are unable to keep an appointment. What may interact with this medicine? This medicine may interact with the following medications: -other iron products This list may not describe all possible interactions. Give your health care provider a list of all the medicines, herbs, non-prescription drugs, or dietary supplements you use. Also tell them if you smoke, drink alcohol, or use illegal drugs. Some items may interact with your medicine. What should I watch for while using this medicine? Visit your doctor or healthcare professional regularly. Tell your doctor or healthcare professional if your symptoms do not start to get better or if they get worse. You may need blood work done while you are taking this medicine. You may need to follow a special diet. Talk to your doctor. Foods that contain iron include: whole grains/cereals, dried fruits, beans, or peas, leafy green vegetables, and organ meats (liver, kidney). What side effects may I notice from receiving this medicine? Side effects that you should report to your doctor or health care professional as soon as possible: -allergic reactions like skin rash, itching or hives, swelling of the face,  lips, or tongue -breathing problems -changes in blood pressure -feeling faint or lightheaded, falls -fever or chills -flushing, sweating, or hot feelings -swelling of the ankles or feet Side effects that usually do not require medical attention  (report to your doctor or health care professional if they continue or are bothersome): -diarrhea -headache -nausea, vomiting -stomach pain This list may not describe all possible side effects. Call your doctor for medical advice about side effects. You may report side effects to FDA at 1-800-FDA-1088. Where should I keep my medicine? This drug is given in a hospital or clinic and will not be stored at home. NOTE: This sheet is a summary. It may not cover all possible information. If you have questions about this medicine, talk to your doctor, pharmacist, or health care provider.  2018 Elsevier/Gold Standard (2015-10-03 12:41:49)

## 2017-10-20 NOTE — Progress Notes (Signed)
ON PATHWAY REGIMEN - Breast  No Change  Continue With Treatment as Ordered.   Paclitaxel Weekly + Trastuzumab Weekly:   Administer weekly:     Paclitaxel      Trastuzumab      Trastuzumab   **Always confirm dose/schedule in your pharmacy ordering system**    Trastuzumab (Maintenance - NO Loading Dose):   A cycle is every 21 days:     Trastuzumab   **Always confirm dose/schedule in your pharmacy ordering system**    Patient Characteristics: Postoperative without Neoadjuvant Therapy (Pathologic Staging), Invasive Disease, Adjuvant Therapy, HER2 Positive, ER Negative/Unknown, Node Negative, pT1b, pN0/N59m, Chemotherapy Indicated Therapeutic Status: Postoperative without Neoadjuvant Therapy (Pathologic Staging) AJCC Grade: G3 AJCC N Category: pN0 AJCC M Category: cM0 ER Status: Negative (-) AJCC 8 Stage Grouping: IA HER2 Status: Positive (+) Oncotype Dx Recurrence Score: Not Appropriate AJCC T Category: pT1b PR Status: Negative (-) Intent of Therapy: Curative Intent, Discussed with Patient

## 2017-10-20 NOTE — Assessment & Plan Note (Addendum)
06/09/2017: Left lumpectomy: IDC grade 3, 1.2 cm, DCIS is present, margins negative, 0/6 lymph nodes negative, ER 0%, PR 0%, HER-2 positive ratio 2.28, Ki-67 20%, T1 BN 0 stage IA  Treatment plan: 1. Adjuvant chemotherapy with Taxol Herceptin weekly 12 followed by Herceptin every 3 weeks for total of 6 months 2. Followed by adjuvant radiation to start around 11/16/17  Echo on 08/17/17 demonstrates LVEF 55-60% ____________________________________________________________________  Current treatment: Herceptin  To start every three week Herceptin today.  I again recommended Aleve BID trial prior to referring her to ortho for her thumb.  She has upcoming radiation simulation, and upcoming appointment with Erin [name] for an echocardiogram.    Erin [name] will return in 3 weeks for labs, f/u, Trastuzumab. She will return in 6 weeks for labs, f/u with Erin [name], and Trastuzumab.

## 2017-10-20 NOTE — H&P (Signed)
Erin [name] is an 57 y.o. [phone] female.   Chief Complaint: Pelvic mass HPI: While having treatment for breast cancer with lumpectomy and chemo--taxol ends 1/16. Second agent continues q 3 wks x 1 year, developed some abnormal vaginal bleeding noted in September and October. Had pelvic sono done which showed endometrial thickness of 10 mm and large cyst on left ovary. Further imaging performed which showed complex cyst in the left ovary is 12 cm and appears benign on MRI--CA125 was WNL. Endometrial thickness is 16 mm but with absence of blood flow-felt to be a contraction in the myometrium.    Past Medical History:  Diagnosis Date  . Breast cancer ([place]) 05/2017   left  . Cough 06/03/2017  . Deaf   . Dyspnea    states SOB with ADLs and at night - no home O2  . High cholesterol   . Non-insulin dependent type 2 diabetes mellitus ([place])   . Runny nose 06/03/2017   clear drainage, per pt.    Past Surgical History:  Procedure Laterality Date  . BREAST LUMPECTOMY WITH RADIOACTIVE SEED AND SENTINEL LYMPH NODE BIOPSY Left 06/09/2017   Procedure: BREAST LUMPECTOMY WITH RADIOACTIVE SEED AND SENTINEL LYMPH NODE BIOPSY;  Surgeon: Excell Seltzer, MD;  Location: [facility];  Service: General;  Laterality: Left;  . PORTACATH PLACEMENT Right 06/09/2017   Procedure: INSERTION PORT-A-CATH WITH [place];  Surgeon: Excell Seltzer, MD;  Location: [facility];  Service: General;  Laterality: Right;  . TUBAL LIGATION  02/04/2002    Family History  Problem Relation Age of Onset  . Hypertension Mother   . Diabetes Father   . Hypertension Father   . Hypertension Sister   . Cancer Paternal Grandmother   . Diabetes Paternal Grandmother   . Colon cancer Paternal Grandmother   . CAD Brother   . Breast cancer Paternal Aunt   . Breast cancer Paternal Aunt    Social History:  reports that  has never smoked. she has never used smokeless tobacco. She reports that she does not  drink alcohol or use drugs.  Allergies: No Known Allergies  No medications prior to admission.    A comprehensive review of systems was negative.  Last menstrual period 07/09/2017. General appearance: alert, cooperative and appears stated age Head: Normocephalic, without obvious abnormality, atraumatic Neck: supple, symmetrical, trachea midline Lungs: normal effort Heart: regular rate and rhythm Abdomen: soft, non-tender; bowel sounds normal; no masses,  no organomegaly Extremities: Homans sign is negative, no sign of DVT Skin: Skin color, texture, turgor normal. No rashes or lesions Neurologic: Grossly normal   Lab Results  Component Value Date   WBC 11.5 (H) 10/20/2017   HGB 10.2 (L) 10/20/2017   HCT 31.3 (L) 10/20/2017   MCV 86.8 10/20/2017   PLT 265 10/20/2017   No results found for: PREGTESTUR, PREGSERUM, HCG, HCGQUANT   Assessment/Plan Principal Problem:   Ovarian mass, left Active Problems:   Menorrhagia  For LAVH with removal of adnexal mass and hysterectomy with bilateral salpingectomy Risks include but are not limited to bleeding, infection, injury to surrounding structures, including bowel, bladder and ureters, blood clots, and death.  Likelihood of success is high.    [name] 10/20/2017, 10:03 AM

## 2017-10-20 NOTE — Progress Notes (Signed)
[facility]:    Erin Spruce, FNP [place] [address]   DIAGNOSIS: Cancer Staging Malignant neoplasm of lower-outer quadrant of left breast of female, estrogen receptor negative ([place]) Staging form: Breast, AJCC 8th Edition - Clinical stage from 05/18/2017: Stage IA (cT1b, cN0, cM0, G3, ER: Negative, PR: Negative, HER2: Positive) - Unsigned - Pathologic: Stage IA (pT1c, pN0, cM0, G3, ER: Negative, PR: Negative, HER2: Positive) - Signed by [name], NP on 07/07/2017   SUMMARY OF ONCOLOGIC HISTORY:   Malignant neoplasm of lower-outer quadrant of left breast of female, estrogen receptor negative ([place])   05/11/2017 Initial Diagnosis    Left breast biopsy 3:30 position 6 cm from nipple: IDC with DCIS, lymphovascular invasion present, grade 2-3, ER 0%, PR 0%, HER-2 positive ratio 2.28, Ki-67 20%, 1 cm lesion left breast, T1b N0 stage IA clinical stage      06/09/2017 Surgery    Left lumpectomy: IDC grade 3, 1.2 cm, DCIS is present, margins negative, 0/6 lymph nodes negative, ER 0%, PR 0%, HER-2 positive ratio 2.28, Ki-67 20%, T1 BN 0 stage IA      07/09/2017 -  Adjuvant Chemotherapy    Taxol/Herceptin completed on 09/29/2017, went on to receive every 3 week adjuvant Herceptin on 10/20/2017       CURRENT THERAPY: Herceptin  INTERVAL HISTORY: Erin [name] 57 y.o. female returns for evaluation prior to receiving her adjuvant Herceptin.  She is doing well today.  Her last echo on 08/17/2017 was normal.  She had one episode of nausea, that has since resolved.  She denies any new issues.  She continues to have thumb pain.  I had recommended she start taking aleve and she had forgotten.  She start this.     Patient Active Problem List   Diagnosis Date Noted  . Hyperlipidemia associated with type 2 diabetes mellitus ([place]) 08/27/2017  . Ovarian mass, left 08/26/2017  . Menorrhagia 08/26/2017  . Port-A-Cath in place  07/23/2017  . Malignant neoplasm of lower-outer quadrant of left breast of female, estrogen receptor negative ([place]) 05/13/2017  . Abdominal bloating 04/02/2017  . Chronic right-sided low back pain without sciatica 07/02/2016  . DM type 2 (diabetes mellitus, type 2) ([place]) 11/18/2014  . Intermittent constipation 06/06/2010  . UNSPECIFIED IRON DEFICIENCY ANEMIA 04/22/2010  . DEPRESSION 04/10/2010  . DEAFNESS 04/10/2010    has No Known Allergies.  MEDICAL HISTORY: Past Medical History:  Diagnosis Date  . Breast cancer ([place]) 05/2017   left  . Cough 06/03/2017  . Deaf   . Dyspnea    states SOB with ADLs and at night - no home O2  . High cholesterol   . Non-insulin dependent type 2 diabetes mellitus ([place])   . Runny nose 06/03/2017   clear drainage, per pt.    SURGICAL HISTORY: Past Surgical History:  Procedure Laterality Date  . BREAST LUMPECTOMY WITH RADIOACTIVE SEED AND SENTINEL LYMPH NODE BIOPSY Left 06/09/2017   Procedure: BREAST LUMPECTOMY WITH RADIOACTIVE SEED AND SENTINEL LYMPH NODE BIOPSY;  Surgeon: Excell Seltzer, MD;  Location: [facility];  Service: General;  Laterality: Left;  . PORTACATH PLACEMENT Right 06/09/2017   Procedure: INSERTION PORT-A-CATH WITH [place];  Surgeon: Excell Seltzer, MD;  Location: [facility];  Service: General;  Laterality: Right;  . TUBAL LIGATION  02/04/2002    SOCIAL HISTORY: Social History   Socioeconomic History  . Marital status: Legally Separated    Spouse name:  Not on file  . Number of children: Not on file  . Years of education: Not on file  . Highest education level: Not on file  Social Needs  . Financial resource strain: Not on file  . Food insecurity - worry: Not on file  . Food insecurity - inability: Not on file  . Transportation needs - medical: Not on file  . Transportation needs - non-medical: Not on file  Occupational History  . Not on file  Tobacco Use  . Smoking status: Never Smoker   . Smokeless tobacco: Never Used  Substance and Sexual Activity  . Alcohol use: No  . Drug use: No  . Sexual activity: No  Other Topics Concern  . Not on file  Social History Narrative   1 boy and 1 girl   Resides in [place]: Family History  Problem Relation Age of Onset  . Hypertension Mother   . Diabetes Father   . Hypertension Father   . Hypertension Sister   . Cancer Paternal Grandmother   . Diabetes Paternal Grandmother   . Colon cancer Paternal Grandmother   . CAD Brother   . Breast cancer Paternal Aunt   . Breast cancer Paternal Aunt     Review of Systems  Constitutional: Negative for appetite change, chills, fatigue, fever and unexpected weight change.  HENT:   Negative for hearing loss and lump/mass.   Eyes: Negative for eye problems and icterus.  Respiratory: Negative for chest tightness, cough and shortness of breath.   Cardiovascular: Negative for chest pain, leg swelling and palpitations.  Gastrointestinal: Negative for abdominal distention, abdominal pain, constipation, diarrhea, nausea and vomiting.  Endocrine: Negative for hot flashes.  Genitourinary: Negative for difficulty urinating.   Skin: Negative for itching and rash.  Neurological: Negative for dizziness, extremity weakness, headaches and numbness.  Hematological: Negative for adenopathy.  Psychiatric/Behavioral: The patient is not nervous/anxious.       PHYSICAL EXAMINATION  ECOG PERFORMANCE STATUS: 1 - Symptomatic but completely ambulatory  Vitals:   10/20/17 0821  BP: 125/74  Pulse: 91  Resp: 18  Temp: 97.9 F (36.6 C)  SpO2: 99%    Physical Exam  Constitutional: She is oriented to person, place, and time and well-developed, well-nourished, and in no distress.  HENT:  Head: Normocephalic and atraumatic.  Mouth/Throat: Oropharynx is clear and moist. No oropharyngeal exudate.  Eyes: Pupils are equal, round, and reactive to light. No scleral  icterus.  Neck: Neck supple.  Cardiovascular: Normal rate, regular rhythm and normal heart sounds.  Pulmonary/Chest: Effort normal and breath sounds normal.  Abdominal: Soft. Bowel sounds are normal. She exhibits no distension and no mass. There is no tenderness. There is no rebound and no guarding.  Musculoskeletal: She exhibits no edema.  Lymphadenopathy:    She has no cervical adenopathy.  Neurological: She is alert and oriented to person, place, and time.  Skin: Skin is warm and dry. No rash noted. No erythema.  Psychiatric: Mood and affect normal.    LABORATORY DATA:  CBC    Component Value Date/Time   WBC 11.5 (H) 10/20/2017 0742   RBC 3.61 (L) 10/20/2017 0742   HGB 10.2 (L) 10/20/2017 0742   HGB 9.3 (L) 09/15/2017 0800   HCT 31.3 (L) 10/20/2017 0742   HCT 28.2 (L) 09/15/2017 0800   PLT 265 10/20/2017 0742   PLT 357 09/15/2017 0800   PLT 375 05/26/2017 0918   MCV 86.8 10/20/2017 0742  MCV 82.4 09/15/2017 0800   MCH 28.2 10/20/2017 0742   MCHC 32.5 10/20/2017 0742   RDW 24.7 (H) 10/20/2017 0742   RDW 20.8 (H) 09/15/2017 0800   LYMPHSABS 2.9 10/20/2017 0742   LYMPHSABS 2.5 09/15/2017 0800   MONOABS 1.3 (H) 10/20/2017 0742   MONOABS 0.8 09/15/2017 0800   EOSABS 0.1 10/20/2017 0742   EOSABS 0.1 09/15/2017 0800   EOSABS 0.1 05/26/2017 0918   BASOSABS 0.0 10/20/2017 0742   BASOSABS 0.0 09/15/2017 0800    CMP     Component Value Date/Time   NA 139 10/20/2017 0742   NA 139 09/15/2017 0800   K 4.1 10/20/2017 0742   K 3.8 09/15/2017 0800   CL 103 10/20/2017 0742   CO2 25 10/20/2017 0742   CO2 23 09/15/2017 0800   GLUCOSE 126 10/20/2017 0742   GLUCOSE 113 09/15/2017 0800   BUN 8 10/20/2017 0742   BUN 8.7 09/15/2017 0800   CREATININE 0.72 10/20/2017 0742   CREATININE 0.7 09/15/2017 0800   CALCIUM 9.5 10/20/2017 0742   CALCIUM 9.2 09/15/2017 0800   PROT 7.3 10/20/2017 0742   PROT 7.3 09/15/2017 0800   ALBUMIN 3.4 (L) 10/20/2017 0742   ALBUMIN 3.3 (L)  09/15/2017 0800   AST 14 10/20/2017 0742   AST 13 09/15/2017 0800   ALT 17 10/20/2017 0742   ALT 19 09/15/2017 0800   ALKPHOS 83 10/20/2017 0742   ALKPHOS 74 09/15/2017 0800   BILITOT 0.3 10/20/2017 0742   BILITOT 0.27 09/15/2017 0800   GFRNONAA >60 10/20/2017 0742   GFRAA >60 10/20/2017 0742      ASSESSMENT and PLAN:   Malignant neoplasm of lower-outer quadrant of left breast of female, estrogen receptor negative ([place]) 06/09/2017: Left lumpectomy: IDC grade 3, 1.2 cm, DCIS is present, margins negative, 0/6 lymph nodes negative, ER 0%, PR 0%, HER-2 positive ratio 2.28, Ki-67 20%, T1 BN 0 stage IA  Treatment plan: 1. Adjuvant chemotherapy with Taxol Herceptin weekly 12 followed by Herceptin every 3 weeks for total of 6 months 2. Followed by adjuvant radiation to start around 11/16/17  Echo on 08/17/17 demonstrates LVEF 55-60% ____________________________________________________________________  Current treatment: Herceptin  To start every three week Herceptin today.  I again recommended Aleve BID trial prior to referring her to ortho for her thumb.  She has upcoming radiation simulation, and upcoming appointment with Dr. Haroldine Laws for an echocardiogram.    [place] will return in 3 weeks for labs, f/u, Trastuzumab. She will return in 6 weeks for labs, f/u with Dr. [name], and Trastuzumab.    All questions were answered. The patient knows to call the clinic with any problems, questions or concerns. We can certainly see the patient much sooner if necessary.  A total of (30) minutes of face-to-face time was spent with this patient with greater than 50% of that time in counseling and care-coordination.  This note was electronically signed. Scot Dock, NP 10/20/2017

## 2017-10-27 NOTE — Patient Instructions (Addendum)
Your procedure is scheduled on: Thursday, Feb 21  Enter through the Main Entrance of [facility] at: 1:30 pm  Pick up the phone at the desk and dial [phone].  Call this number if you have problems the morning of surgery: [phone].  Remember: Do NOT eat food after midnight Wednesday  Do NOT drink clear liquids (including water) after 9 am Thursday.  Take these medicines the morning of surgery with a SIP OF WATER: Lipitor  Do not take your Thursday morning dose of Metformin.  We will check your blood sugar upon arrival to Short Stay and treat if needed.  Stop herbal medications and supplements at this time.  Do NOT wear jewelry (body piercing), metal hair clips/bobby pins, make-up, or nail polish. Do NOT wear lotions, powders, or perfumes.  You may wear deoderant. Do NOT shave for 48 hours prior to surgery. Do NOT bring valuables to the hospital.  Leave suitcase in car.  After surgery it may be brought to your room.  For patients admitted to the hospital, checkout time is 11:00 AM the day of discharge. Have a responsible adult drive you home and stay with you for 24 hours after your procedure.  Home with Mother [place] cell [phone].

## 2017-11-02 ENCOUNTER — Encounter: Payer: Self-pay | Admitting: Gastroenterology

## 2017-11-02 ENCOUNTER — Other Ambulatory Visit: Payer: Self-pay

## 2017-11-02 ENCOUNTER — Ambulatory Visit (AMBULATORY_SURGERY_CENTER): Payer: Medicaid Other | Admitting: Gastroenterology

## 2017-11-02 VITALS — BP 145/61 | HR 80 | Temp 98.2°F | Resp 11 | Ht 61.0 in | Wt 161.0 lb

## 2017-11-02 DIAGNOSIS — D123 Benign neoplasm of transverse colon: Secondary | ICD-10-CM

## 2017-11-02 DIAGNOSIS — K635 Polyp of colon: Secondary | ICD-10-CM

## 2017-11-02 DIAGNOSIS — Z1211 Encounter for screening for malignant neoplasm of colon: Secondary | ICD-10-CM

## 2017-11-02 DIAGNOSIS — D125 Benign neoplasm of sigmoid colon: Secondary | ICD-10-CM

## 2017-11-02 HISTORY — PX: COLONOSCOPY: SHX174

## 2017-11-02 MED ORDER — SODIUM CHLORIDE 0.9 % IV SOLN: 500.0000 mL | Freq: Once | INTRAVENOUS | Status: DC

## 2017-11-02 NOTE — Op Note (Signed)
[facility] Patient Name: Erin [name] Procedure Date: 11/02/2017 1:29 PM MRN: [id number] Endoscopist: Mauri Pole , MD Age: 57 Referring MD:  Date of Birth: 24-Nov-1960 Gender: Female Account #: [id number] Procedure:                Colonoscopy Indications:              Screening for colorectal malignant neoplasm Medicines:                Monitored Anesthesia Care Procedure:                Pre-Anesthesia Assessment:                           - Prior to the procedure, a History and Physical                            was performed, and patient medications and                            allergies were reviewed. The patient's tolerance of                            previous anesthesia was also reviewed. The risks                            and benefits of the procedure and the sedation                            options and risks were discussed with the patient.                            All questions were answered, and informed consent                            was obtained. Prior Anticoagulants: The patient has                            taken no previous anticoagulant or antiplatelet                            agents. ASA Grade Assessment: II - A patient with                            mild systemic disease. After reviewing the risks                            and benefits, the patient was deemed in                            satisfactory condition to undergo the procedure.                           After obtaining informed consent, the colonoscope  was passed under direct vision. Throughout the                            procedure, the patient's blood pressure, pulse, and                            oxygen saturations were monitored continuously. The                            Colonoscope was introduced through the anus and                            advanced to the the terminal ileum, with                            identification of the  appendiceal orifice and IC                            valve. The colonoscopy was performed without                            difficulty. The patient tolerated the procedure                            well. The quality of the bowel preparation was                            excellent. The terminal ileum, ileocecal valve,                            appendiceal orifice, and rectum were photographed. Scope In: 1:35:13 PM Scope Out: 1:51:09 PM Scope Withdrawal Time: 0 hours 11 minutes 36 seconds  Total Procedure Duration: 0 hours 15 minutes 56 seconds  Findings:                 The perianal and digital rectal examinations were                            normal.                           Two sessile polyps were found in the sigmoid colon                            and transverse colon. The polyps were 5 to 7 mm in                            size. These polyps were removed with a cold snare.                            Resection and retrieval were complete.                           Non-bleeding internal hemorrhoids were found during  retroflexion. The hemorrhoids were small.                           The exam was otherwise without abnormality. Complications:            No immediate complications. Estimated Blood Loss:     Estimated blood loss was minimal. Impression:               - Two 5 to 7 mm polyps in the sigmoid colon and in                            the transverse colon, removed with a cold snare.                            Resected and retrieved.                           - Non-bleeding internal hemorrhoids.                           - The examination was otherwise normal. Recommendation:           - Patient has a contact number available for                            emergencies. The signs and symptoms of potential                            delayed complications were discussed with the                            patient. Return to normal activities  tomorrow.                            Written discharge instructions were provided to the                            patient.                           - Resume previous diet.                           - Continue present medications.                           - Await pathology results.                           - Repeat colonoscopy in 5-10 years for surveillance                            based on pathology results. Mauri Pole, MD 11/02/2017 1:56:20 PM This report has been signed electronically.

## 2017-11-02 NOTE — Progress Notes (Signed)
Called to room to assist during endoscopic procedure.  Patient ID and intended procedure confirmed with present staff. Received instructions for my participation in the procedure from the performing physician.  

## 2017-11-02 NOTE — Progress Notes (Signed)
Pt's states no medical or surgical changes since previsit or office visit. 

## 2017-11-02 NOTE — Patient Instructions (Signed)
YOU HAD AN ENDOSCOPIC PROCEDURE TODAY AT THE [facility]:   Refer to the procedure report that was given to you for any specific questions about what was found during the examination.  If the procedure report does not answer your questions, please call your gastroenterologist to clarify.  If you requested that your care partner not be given the details of your procedure findings, then the procedure report has been included in a sealed envelope for you to review at your convenience later.  YOU SHOULD EXPECT: Some feelings of bloating in the abdomen. Passage of more gas than usual.  Walking can help get rid of the air that was put into your GI tract during the procedure and reduce the bloating. If you had a lower endoscopy (such as a colonoscopy or flexible sigmoidoscopy) you may notice spotting of blood in your stool or on the toilet paper. If you underwent a bowel prep for your procedure, you may not have a normal bowel movement for a few days.  Please Note:  You might notice some irritation and congestion in your nose or some drainage.  This is from the oxygen used during your procedure.  There is no need for concern and it should clear up in a day or so.  SYMPTOMS TO REPORT IMMEDIATELY:   Following lower endoscopy (colonoscopy or flexible sigmoidoscopy):  Excessive amounts of blood in the stool  Significant tenderness or worsening of abdominal pains  Swelling of the abdomen that is new, acute  Fever of 100F or higher    For urgent or emergent issues, a gastroenterologist can be reached at any hour by calling [phone].   DIET:  We do recommend a small meal at first, but then you may proceed to your regular diet.  Drink plenty of fluids but you should avoid alcoholic beverages for 24 hours.  ACTIVITY:  You should plan to take it easy for the rest of today and you should NOT DRIVE or use heavy machinery until tomorrow (because of the sedation medicines used during the test).     FOLLOW UP: Our staff will call the number listed on your records the next business day following your procedure to check on you and address any questions or concerns that you may have regarding the information given to you following your procedure. If we do not reach you, we will leave a message.  However, if you are feeling well and you are not experiencing any problems, there is no need to return our call.  We will assume that you have returned to your regular daily activities without incident.  If any biopsies were taken you will be contacted by phone or by letter within the next 1-3 weeks.  Please call us at [phone] if you have not heard about the biopsies in 3 weeks.    SIGNATURES/CONFIDENTIALITY: You and/or your care partner have signed paperwork which will be entered into your electronic medical record.  These signatures attest to the fact that that the information above on your After Visit Summary has been reviewed and is understood.  Full responsibility of the confidentiality of this discharge information lies with you and/or your care-partner.   Resume medications. Information given on polyps and hemorrhoids. 

## 2017-11-02 NOTE — Progress Notes (Signed)
Spontaneous respirations throughout. VSS. Resting comfortably. To PACU on room air. Report to  RN. 

## 2017-11-03 ENCOUNTER — Encounter (HOSPITAL_COMMUNITY)
Admission: RE | Admit: 2017-11-03 | Discharge: 2017-11-03 | Disposition: A | Discharge status: Home / Self Care | Payer: Medicaid Other | Source: Ambulatory Visit | Attending: Family Medicine | Admitting: Family Medicine

## 2017-11-03 ENCOUNTER — Telehealth: Payer: Self-pay

## 2017-11-03 ENCOUNTER — Encounter (HOSPITAL_COMMUNITY): Payer: Self-pay

## 2017-11-03 ENCOUNTER — Other Ambulatory Visit: Payer: Self-pay

## 2017-11-03 DIAGNOSIS — Z01812 Encounter for preprocedural laboratory examination: Secondary | ICD-10-CM | POA: Insufficient documentation

## 2017-11-03 HISTORY — DX: Major depressive disorder, single episode, unspecified: F32.9

## 2017-11-03 HISTORY — DX: Depression, unspecified: F32.A

## 2017-11-03 HISTORY — DX: Anemia, unspecified: D64.9

## 2017-11-03 HISTORY — DX: Unspecified osteoarthritis, unspecified site: M19.90

## 2017-11-03 HISTORY — DX: Essential (primary) hypertension: I10

## 2017-11-03 HISTORY — DX: Personal history of other medical treatment: Z92.89

## 2017-11-03 LAB — BASIC METABOLIC PANEL
ANION GAP: 8 (ref 5–15)
BUN: 8 mg/dL (ref 6–20)
CALCIUM: 8.9 mg/dL (ref 8.9–10.3)
CO2: 22 mmol/L (ref 22–32)
Chloride: 106 mmol/L (ref 101–111)
Creatinine, Ser: 0.68 mg/dL (ref 0.44–1.00)
GFR calc Af Amer: >60 mL/min (ref >60)
GLUCOSE: 138 mg/dL — AB (ref 65–99)
Potassium: 3.5 mmol/L (ref 3.5–5.1)
SODIUM: 136 mmol/L (ref 135–145)

## 2017-11-03 LAB — CBC
HCT: 32.2 % — ABNORMAL LOW (ref 36.0–46.0)
Hemoglobin: 10.5 g/dL — ABNORMAL LOW (ref 12.0–15.0)
MCH: 28.9 pg (ref 26.0–34.0)
MCHC: 32.6 g/dL (ref 30.0–36.0)
MCV: 88.7 fL (ref 78.0–100.0)
PLATELETS: 296 10*3/uL (ref 150–400)
RBC: 3.63 MIL/uL — AB (ref 3.87–5.11)
RDW: 21.5 % — ABNORMAL HIGH (ref 11.5–15.5)
WBC: 8.7 10*3/uL (ref 4.0–10.5)

## 2017-11-03 NOTE — Telephone Encounter (Signed)
  Follow up Call-  Call back number 11/02/2017  Post procedure Call Back phone  # [phone] Friend(may call and leave message)    [phone]  Some recent data might be hidden     Patient questions:  Do you have a fever, pain , or abdominal swelling? No. Pain Score  0 *  Have you tolerated food without any problems? Yes.    Have you been able to return to your normal activities? Yes.    Do you have any questions about your discharge instructions: Diet   No. Medications  No. Follow up visit  No.  Do you have questions or concerns about your Care? No.  Actions: * If pain score is 4 or above: No action needed, pain <4.

## 2017-11-03 NOTE — Pre-Procedure Instructions (Signed)
SDS BB History Log sent to lab for patient's previous history of blood transfusion in 07/2017 at [facility].

## 2017-11-03 NOTE — Pre-Procedure Instructions (Signed)
Used Janetta Hora, Deaf Interpreter for translation at PAT appointment today.

## 2017-11-04 ENCOUNTER — Ambulatory Visit (HOSPITAL_COMMUNITY): Payer: Medicaid Other | Admitting: Anesthesiology

## 2017-11-04 ENCOUNTER — Encounter (HOSPITAL_COMMUNITY): Payer: Self-pay

## 2017-11-04 ENCOUNTER — Ambulatory Visit (HOSPITAL_COMMUNITY)
Admission: RE | Admit: 2017-11-04 | Discharge: 2017-11-05 | Disposition: A | Discharge status: Home / Self Care | Payer: Medicaid Other | Source: Ambulatory Visit | Attending: Family Medicine | Admitting: Family Medicine

## 2017-11-04 ENCOUNTER — Other Ambulatory Visit: Payer: Self-pay

## 2017-11-04 ENCOUNTER — Encounter (HOSPITAL_COMMUNITY)
Admission: RE | Disposition: A | Discharge status: Home / Self Care | Payer: Self-pay | Source: Ambulatory Visit | Attending: Family Medicine

## 2017-11-04 DIAGNOSIS — N83201 Unspecified ovarian cyst, right side: Secondary | ICD-10-CM | POA: Diagnosis not present

## 2017-11-04 DIAGNOSIS — N800 Endometriosis of uterus: Secondary | ICD-10-CM | POA: Diagnosis not present

## 2017-11-04 DIAGNOSIS — D259 Leiomyoma of uterus, unspecified: Secondary | ICD-10-CM | POA: Diagnosis not present

## 2017-11-04 DIAGNOSIS — N838 Other noninflammatory disorders of ovary, fallopian tube and broad ligament: Secondary | ICD-10-CM | POA: Diagnosis present

## 2017-11-04 DIAGNOSIS — R229 Localized swelling, mass and lump, unspecified: Secondary | ICD-10-CM | POA: Diagnosis present

## 2017-11-04 DIAGNOSIS — H9190 Unspecified hearing loss, unspecified ear: Secondary | ICD-10-CM | POA: Insufficient documentation

## 2017-11-04 DIAGNOSIS — Z90710 Acquired absence of both cervix and uterus: Secondary | ICD-10-CM | POA: Diagnosis present

## 2017-11-04 DIAGNOSIS — C50512 Malignant neoplasm of lower-outer quadrant of left female breast: Secondary | ICD-10-CM | POA: Diagnosis not present

## 2017-11-04 DIAGNOSIS — R1909 Other intra-abdominal and pelvic swelling, mass and lump: Secondary | ICD-10-CM | POA: Diagnosis present

## 2017-11-04 DIAGNOSIS — N920 Excessive and frequent menstruation with regular cycle: Secondary | ICD-10-CM | POA: Diagnosis present

## 2017-11-04 DIAGNOSIS — C5701 Malignant neoplasm of right fallopian tube: Secondary | ICD-10-CM | POA: Insufficient documentation

## 2017-11-04 DIAGNOSIS — Z95828 Presence of other vascular implants and grafts: Secondary | ICD-10-CM | POA: Insufficient documentation

## 2017-11-04 DIAGNOSIS — C50912 Malignant neoplasm of unspecified site of left female breast: Secondary | ICD-10-CM | POA: Insufficient documentation

## 2017-11-04 HISTORY — PX: LAPAROSCOPIC BILATERAL SALPINGO OOPHERECTOMY: SHX5890

## 2017-11-04 HISTORY — PX: LAPAROSCOPIC ASSISTED VAGINAL HYSTERECTOMY: SHX5398

## 2017-11-04 LAB — GLUCOSE, CAPILLARY
GLUCOSE-CAPILLARY: 80 mg/dL (ref 65–99)
GLUCOSE-CAPILLARY: 186 mg/dL — AB (ref 65–99)

## 2017-11-04 SURGERY — HYSTERECTOMY, VAGINAL, LAPAROSCOPY-ASSISTED
Anesthesia: General | Site: Vagina

## 2017-11-04 MED ORDER — LIDOCAINE HCL (CARDIAC) 20 MG/ML IV SOLN
INTRAVENOUS | Status: AC
  Filled 2017-11-04: qty 5

## 2017-11-04 MED ORDER — HYDROMORPHONE HCL 1 MG/ML IJ SOLN
INTRAMUSCULAR | Status: AC
  Filled 2017-11-04: qty 1

## 2017-11-04 MED ORDER — DEXAMETHASONE SODIUM PHOSPHATE 4 MG/ML IJ SOLN
INTRAMUSCULAR | Status: DC | PRN
  Administered 2017-11-04: 4 mg via INTRAVENOUS

## 2017-11-04 MED ORDER — ONDANSETRON HCL 4 MG/2ML IJ SOLN
INTRAMUSCULAR | Status: AC
  Filled 2017-11-04: qty 2

## 2017-11-04 MED ORDER — LACTATED RINGERS IR SOLN
Status: DC | PRN
  Administered 2017-11-04: 3000 mL

## 2017-11-04 MED ORDER — LACTATED RINGERS IV SOLN
INTRAVENOUS | Status: DC
  Administered 2017-11-04: 22:00:00 via INTRAVENOUS

## 2017-11-04 MED ORDER — PROMETHAZINE HCL 25 MG/ML IJ SOLN
6.2500 mg | INTRAMUSCULAR | Status: DC | PRN
  Administered 2017-11-04: 6.25 mg via INTRAVENOUS

## 2017-11-04 MED ORDER — HYDROMORPHONE HCL 1 MG/ML IJ SOLN
INTRAMUSCULAR | Status: AC
  Administered 2017-11-04: 0.5 mg via INTRAVENOUS
  Filled 2017-11-04: qty 1

## 2017-11-04 MED ORDER — ACETAMINOPHEN 10 MG/ML IV SOLN
INTRAVENOUS | Status: AC
  Filled 2017-11-04: qty 100

## 2017-11-04 MED ORDER — ROCURONIUM BROMIDE 100 MG/10ML IV SOLN
INTRAVENOUS | Status: DC | PRN
  Administered 2017-11-04: 10 mg via INTRAVENOUS
  Administered 2017-11-04 (×2): 5 mg via INTRAVENOUS
  Administered 2017-11-04: 45 mg via INTRAVENOUS

## 2017-11-04 MED ORDER — ESTRADIOL 0.1 MG/GM VA CREA
TOPICAL_CREAM | VAGINAL | Status: AC
  Filled 2017-11-04: qty 42.5

## 2017-11-04 MED ORDER — ACETAMINOPHEN 10 MG/ML IV SOLN
1000.0000 mg | Freq: Once | INTRAVENOUS | Status: AC
  Administered 2017-11-04: 1000 mg via INTRAVENOUS
  Filled 2017-11-04: qty 100

## 2017-11-04 MED ORDER — BUPIVACAINE HCL (PF) 0.25 % IJ SOLN
INTRAMUSCULAR | Status: DC | PRN
  Administered 2017-11-04: 5 mL

## 2017-11-04 MED ORDER — SCOPOLAMINE 1 MG/3DAYS TD PT72
1.0000 | MEDICATED_PATCH | Freq: Once | TRANSDERMAL | Status: DC
  Administered 2017-11-04: 1.5 mg via TRANSDERMAL

## 2017-11-04 MED ORDER — FENTANYL CITRATE (PF) 250 MCG/5ML IJ SOLN
INTRAMUSCULAR | Status: AC
  Filled 2017-11-04: qty 5

## 2017-11-04 MED ORDER — SODIUM CHLORIDE 0.9% FLUSH: 9.0000 mL | INTRAVENOUS | Status: DC | PRN

## 2017-11-04 MED ORDER — MENTHOL 3 MG MT LOZG: 1.0000 | LOZENGE | OROMUCOSAL | Status: DC | PRN

## 2017-11-04 MED ORDER — BUPIVACAINE-EPINEPHRINE 0.25% -1:200000 IJ SOLN
INTRAMUSCULAR | Status: DC | PRN
  Administered 2017-11-04: 30 mL

## 2017-11-04 MED ORDER — PHENYLEPHRINE 40 MCG/ML (10ML) SYRINGE FOR IV PUSH (FOR BLOOD PRESSURE SUPPORT)
PREFILLED_SYRINGE | INTRAVENOUS | Status: AC
  Filled 2017-11-04: qty 10

## 2017-11-04 MED ORDER — IBUPROFEN 600 MG PO TABS: 600.0000 mg | ORAL_TABLET | Freq: Four times a day (QID) | ORAL | Status: DC | PRN

## 2017-11-04 MED ORDER — PROPOFOL 10 MG/ML IV BOLUS
INTRAVENOUS | Status: AC
  Filled 2017-11-04: qty 20

## 2017-11-04 MED ORDER — HYDROMORPHONE HCL 1 MG/ML IJ SOLN
0.2500 mg | INTRAMUSCULAR | Status: DC | PRN
  Administered 2017-11-04: 0.25 mg via INTRAVENOUS
  Administered 2017-11-04: 0.5 mg via INTRAVENOUS

## 2017-11-04 MED ORDER — KETOROLAC TROMETHAMINE 30 MG/ML IJ SOLN
INTRAMUSCULAR | Status: AC
  Administered 2017-11-04: 30 mg via INTRAVENOUS
  Filled 2017-11-04: qty 1

## 2017-11-04 MED ORDER — LIDOCAINE HCL (CARDIAC) 20 MG/ML IV SOLN
INTRAVENOUS | Status: DC | PRN
  Administered 2017-11-04: 100 mg via INTRAVENOUS

## 2017-11-04 MED ORDER — MIDAZOLAM HCL 2 MG/2ML IJ SOLN
INTRAMUSCULAR | Status: DC | PRN
  Administered 2017-11-04: 2 mg via INTRAVENOUS

## 2017-11-04 MED ORDER — OXYCODONE-ACETAMINOPHEN 5-325 MG PO TABS: 1.0000 | ORAL_TABLET | Freq: Four times a day (QID) | ORAL | 0 refills | Status: DC | PRN

## 2017-11-04 MED ORDER — ONDANSETRON HCL 4 MG/2ML IJ SOLN
INTRAMUSCULAR | Status: DC | PRN
  Administered 2017-11-04: 4 mg via INTRAVENOUS

## 2017-11-04 MED ORDER — ESTRADIOL 0.1 MG/GM VA CREA
TOPICAL_CREAM | VAGINAL | Status: DC | PRN
  Administered 2017-11-04: 1 via VAGINAL

## 2017-11-04 MED ORDER — OXYCODONE-ACETAMINOPHEN 5-325 MG PO TABS
2.0000 | ORAL_TABLET | ORAL | Status: DC | PRN
  Administered 2017-11-05: 2 via ORAL
  Filled 2017-11-04 (×2): qty 2

## 2017-11-04 MED ORDER — HYDROCODONE-ACETAMINOPHEN 7.5-325 MG PO TABS: 1.0000 | ORAL_TABLET | Freq: Once | ORAL | Status: DC | PRN

## 2017-11-04 MED ORDER — FENTANYL CITRATE (PF) 250 MCG/5ML IJ SOLN
INTRAMUSCULAR | Status: DC | PRN
  Administered 2017-11-04: 100 ug via INTRAVENOUS
  Administered 2017-11-04 (×3): 50 ug via INTRAVENOUS

## 2017-11-04 MED ORDER — KETOROLAC TROMETHAMINE 30 MG/ML IJ SOLN
INTRAMUSCULAR | Status: AC
  Filled 2017-11-04: qty 1

## 2017-11-04 MED ORDER — LACTATED RINGERS IV SOLN
INTRAVENOUS | Status: DC
  Administered 2017-11-04: 17:00:00 via INTRAVENOUS
  Administered 2017-11-04: 125 mL/h via INTRAVENOUS

## 2017-11-04 MED ORDER — KETOROLAC TROMETHAMINE 30 MG/ML IJ SOLN
30.0000 mg | Freq: Four times a day (QID) | INTRAMUSCULAR | Status: DC
  Administered 2017-11-04 – 2017-11-05 (×2): 30 mg via INTRAVENOUS
  Filled 2017-11-04: qty 1

## 2017-11-04 MED ORDER — METFORMIN HCL ER 500 MG PO TB24
1000.0000 mg | ORAL_TABLET | Freq: Every day | ORAL | Status: DC
  Administered 2017-11-05: 1000 mg via ORAL
  Filled 2017-11-04 (×2): qty 2

## 2017-11-04 MED ORDER — ROCURONIUM BROMIDE 100 MG/10ML IV SOLN
INTRAVENOUS | Status: AC
  Filled 2017-11-04: qty 1

## 2017-11-04 MED ORDER — ACETAMINOPHEN 10 MG/ML IV SOLN: 1000.0000 mg | Freq: Once | INTRAVENOUS | Status: DC | PRN

## 2017-11-04 MED ORDER — ONDANSETRON HCL 4 MG/2ML IJ SOLN: 4.0000 mg | Freq: Four times a day (QID) | INTRAMUSCULAR | Status: DC | PRN

## 2017-11-04 MED ORDER — LACTATED RINGERS IV SOLN: INTRAVENOUS | Status: DC

## 2017-11-04 MED ORDER — SCOPOLAMINE 1 MG/3DAYS TD PT72
MEDICATED_PATCH | TRANSDERMAL | Status: AC
  Administered 2017-11-04: 1.5 mg via TRANSDERMAL
  Filled 2017-11-04: qty 1

## 2017-11-04 MED ORDER — MEPERIDINE HCL 25 MG/ML IJ SOLN: 6.2500 mg | INTRAMUSCULAR | Status: DC | PRN

## 2017-11-04 MED ORDER — KETOROLAC TROMETHAMINE 30 MG/ML IJ SOLN
30.0000 mg | Freq: Four times a day (QID) | INTRAMUSCULAR | Status: DC
  Filled 2017-11-04: qty 1

## 2017-11-04 MED ORDER — PROPOFOL 10 MG/ML IV BOLUS
INTRAVENOUS | Status: DC | PRN
  Administered 2017-11-04: 150 mg via INTRAVENOUS

## 2017-11-04 MED ORDER — KETOROLAC TROMETHAMINE 30 MG/ML IJ SOLN
30.0000 mg | Freq: Once | INTRAMUSCULAR | Status: AC
  Administered 2017-11-04: 30 mg via INTRAVENOUS

## 2017-11-04 MED ORDER — SUGAMMADEX SODIUM 200 MG/2ML IV SOLN
INTRAVENOUS | Status: DC | PRN
  Administered 2017-11-04: 200 mg via INTRAVENOUS

## 2017-11-04 MED ORDER — HYDROMORPHONE 1 MG/ML IV SOLN
INTRAVENOUS | Status: DC
  Administered 2017-11-04: 20:00:00 via INTRAVENOUS
  Administered 2017-11-05: 0.4 mg via INTRAVENOUS
  Filled 2017-11-04: qty 25

## 2017-11-04 MED ORDER — DEXTROSE-NACL 5-0.9 % IV SOLN
INTRAVENOUS | Status: DC
  Administered 2017-11-04: 20:00:00 via INTRAVENOUS

## 2017-11-04 MED ORDER — PHENYLEPHRINE HCL 10 MG/ML IJ SOLN
INTRAMUSCULAR | Status: DC | PRN
  Administered 2017-11-04 (×2): 80 ug via INTRAVENOUS

## 2017-11-04 MED ORDER — LISINOPRIL 2.5 MG PO TABS
2.5000 mg | ORAL_TABLET | Freq: Every day | ORAL | Status: DC
  Administered 2017-11-05: 2.5 mg via ORAL
  Filled 2017-11-04 (×2): qty 1

## 2017-11-04 MED ORDER — PROMETHAZINE HCL 25 MG/ML IJ SOLN
INTRAMUSCULAR | Status: AC
  Administered 2017-11-04: 6.25 mg via INTRAVENOUS
  Filled 2017-11-04: qty 1

## 2017-11-04 MED ORDER — DIPHENHYDRAMINE HCL 12.5 MG/5ML PO ELIX: 12.5000 mg | ORAL_SOLUTION | Freq: Four times a day (QID) | ORAL | Status: DC | PRN

## 2017-11-04 MED ORDER — DIPHENHYDRAMINE HCL 50 MG/ML IJ SOLN: 12.5000 mg | Freq: Four times a day (QID) | INTRAMUSCULAR | Status: DC | PRN

## 2017-11-04 MED ORDER — MIDAZOLAM HCL 2 MG/2ML IJ SOLN
INTRAMUSCULAR | Status: AC
  Filled 2017-11-04: qty 2

## 2017-11-04 MED ORDER — BUPIVACAINE-EPINEPHRINE (PF) 0.25% -1:200000 IJ SOLN
INTRAMUSCULAR | Status: AC
  Filled 2017-11-04: qty 30

## 2017-11-04 MED ORDER — BUPIVACAINE HCL (PF) 0.25 % IJ SOLN
INTRAMUSCULAR | Status: AC
  Filled 2017-11-04: qty 30

## 2017-11-04 MED ORDER — SUGAMMADEX SODIUM 200 MG/2ML IV SOLN
INTRAVENOUS | Status: AC
  Filled 2017-11-04: qty 2

## 2017-11-04 MED ORDER — CEFAZOLIN SODIUM-DEXTROSE 2-4 GM/100ML-% IV SOLN
2.0000 g | INTRAVENOUS | Status: AC
  Administered 2017-11-04: 2 g via INTRAVENOUS

## 2017-11-04 MED ORDER — HYDROMORPHONE HCL 1 MG/ML IJ SOLN
INTRAMUSCULAR | Status: DC | PRN
  Administered 2017-11-04: 1 mg via INTRAVENOUS

## 2017-11-04 MED ORDER — DEXAMETHASONE SODIUM PHOSPHATE 4 MG/ML IJ SOLN
INTRAMUSCULAR | Status: AC
  Filled 2017-11-04: qty 1

## 2017-11-04 MED ORDER — NALOXONE HCL 0.4 MG/ML IJ SOLN: 0.4000 mg | INTRAMUSCULAR | Status: DC | PRN

## 2017-11-04 SURGICAL SUPPLY — 42 items
APL SRG 38 LTWT LNG FL B (MISCELLANEOUS) ×2
APPLICATOR ARISTA FLEXITIP XL (MISCELLANEOUS) ×1 IMPLANT
CONT PATH 16OZ SNAP LID 3702 (MISCELLANEOUS) ×3 IMPLANT
COVER BACK TABLE 60X90IN (DRAPES) ×3 IMPLANT
COVER MAYO STAND STRL (DRAPES) ×3 IMPLANT
DECANTER SPIKE VIAL GLASS SM (MISCELLANEOUS) ×3 IMPLANT
DRSG OPSITE POSTOP 3X4 (GAUZE/BANDAGES/DRESSINGS) ×1 IMPLANT
DURAPREP 26ML APPLICATOR (WOUND CARE) ×3 IMPLANT
ELECT REM PT RETURN 9FT ADLT (ELECTROSURGICAL) ×3
ELECTRODE REM PT RTRN 9FT ADLT (ELECTROSURGICAL) ×2 IMPLANT
GAUZE PACKING 2X5 YD STRL (GAUZE/BANDAGES/DRESSINGS) ×3 IMPLANT
GLOVE BIOGEL PI IND STRL 6.5 (GLOVE) ×4 IMPLANT
GLOVE BIOGEL PI IND STRL 7.0 (GLOVE) ×10 IMPLANT
GLOVE BIOGEL PI INDICATOR 6.5 (GLOVE) ×2
GLOVE BIOGEL PI INDICATOR 7.0 (GLOVE) ×5
GLOVE ECLIPSE 7.0 STRL STRAW (GLOVE) ×6 IMPLANT
HEMOSTAT ARISTA ABSORB 3G PWDR (MISCELLANEOUS) ×1 IMPLANT
LEGGING LITHOTOMY PAIR STRL (DRAPES) ×3 IMPLANT
NS IRRIG 1000ML POUR BTL (IV SOLUTION) ×3 IMPLANT
PACK LAVH (CUSTOM PROCEDURE TRAY) ×3 IMPLANT
PACK ROBOTIC GOWN (GOWN DISPOSABLE) ×3 IMPLANT
PACK TRENDGUARD 450 HYBRID PRO (MISCELLANEOUS) IMPLANT
PACK TRENDGUARD 600 HYBRD PROC (MISCELLANEOUS) IMPLANT
POUCH ENDO CATCH II 15MM (MISCELLANEOUS) ×1 IMPLANT
PROTECTOR NERVE ULNAR (MISCELLANEOUS) ×6 IMPLANT
SCISSORS LAP 5X35 DISP (ENDOMECHANICALS) IMPLANT
SET IRRIG TUBING LAPAROSCOPIC (IRRIGATION / IRRIGATOR) ×1 IMPLANT
SHEARS HARMONIC ACE PLUS 36CM (ENDOMECHANICALS) ×1 IMPLANT
SLEEVE XCEL OPT CAN 5 100 (ENDOMECHANICALS) ×3 IMPLANT
SUT VIC AB 0 CT1 18XCR BRD8 (SUTURE) ×4 IMPLANT
SUT VIC AB 0 CT1 36 (SUTURE) ×6 IMPLANT
SUT VIC AB 0 CT1 8-18 (SUTURE) ×6
SUT VIC AB 3-0 X1 27 (SUTURE) ×3 IMPLANT
SUT VICRYL 0 UR6 27IN ABS (SUTURE) ×6 IMPLANT
SUT VICRYL 1 TIES 12X18 (SUTURE) ×3 IMPLANT
TOWEL OR 17X24 6PK STRL BLUE (TOWEL DISPOSABLE) ×6 IMPLANT
TRAY FOLEY CATH SILVER 14FR (SET/KITS/TRAYS/PACK) ×3 IMPLANT
TRENDGUARD 450 HYBRID PRO PACK (MISCELLANEOUS) ×3
TRENDGUARD 600 HYBRID PROC PK (MISCELLANEOUS)
TROCAR BALLN 12MMX100 BLUNT (TROCAR) ×1 IMPLANT
TROCAR XCEL NON-BLD 5MMX100MML (ENDOMECHANICALS) ×3 IMPLANT
WARMER LAPAROSCOPE (MISCELLANEOUS) ×3 IMPLANT

## 2017-11-04 NOTE — Anesthesia Preprocedure Evaluation (Addendum)
Anesthesia Evaluation  Patient identified by MRN, date of birth, ID band Patient awake    Reviewed: Allergy & Precautions, NPO status , Patient's Chart, lab work & pertinent test results  Airway Mallampati: II  TM Distance: >3 FB Neck ROM: Full    Dental no notable dental hx.    Pulmonary    Pulmonary exam normal breath sounds clear to auscultation       Cardiovascular hypertension, Normal cardiovascular exam Rhythm:Regular Rate:Normal  Echo 08/17/17-The estimated ejection   fraction was in the range of 55% to 60%. Wall motion was normal;   there were no regional wall motion abnormalities   Neuro/Psych    GI/Hepatic Patient received Oral Contrast Agents,  Endo/Other  diabetes, Type 2  Renal/GU negative Renal ROS     Musculoskeletal   Abdominal   Peds  Hematology  (+) anemia ,   Anesthesia Other Findings   Reproductive/Obstetrics                            Lab Results  Component Value Date   WBC 8.7 11/03/2017   HGB 10.5 (L) 11/03/2017   HCT 32.2 (L) 11/03/2017   MCV 88.7 11/03/2017   PLT 296 11/03/2017   Lab Results  Component Value Date   CREATININE 0.68 11/03/2017   BUN 8 11/03/2017   NA 136 11/03/2017   K 3.5 11/03/2017   CL 106 11/03/2017   CO2 22 11/03/2017    Anesthesia Physical Anesthesia Plan  ASA: III  Anesthesia Plan: General   Post-op Pain Management:    Induction: Intravenous  PONV Risk Score and Plan: Treatment may vary due to age or medical condition, Dexamethasone, Ondansetron and Scopolamine patch - Pre-op  Airway Management Planned: Oral ETT  Additional Equipment:   Intra-op Plan:   Post-operative Plan:   Informed Consent: I have reviewed the patients History and Physical, chart, labs and discussed the procedure including the risks, benefits and alternatives for the proposed anesthesia with the patient or authorized representative who has  indicated his/her understanding and acceptance.   Dental advisory given  Plan Discussed with: CRNA and Anesthesiologist  Anesthesia Plan Comments:         Anesthesia Quick Evaluation

## 2017-11-04 NOTE — Discharge Instructions (Signed)
Vaginal Hysterectomy, Care After °Refer to this sheet in the next few weeks. These instructions provide you with information about caring for yourself after your procedure. Your health care provider may also give you more specific instructions. Your treatment has been planned according to current medical practices, but problems sometimes occur. Call your health care provider if you have any problems or questions after your procedure. °What can I expect after the procedure? °After the procedure, it is common to have: °· Pain. °· Soreness and numbness in your incision areas. °· Vaginal bleeding and discharge. °· Constipation. °· Temporary problems emptying the bladder. °· Feelings of sadness or other emotions. ° °Follow these instructions at home: °Medicines °· Take over-the-counter and prescription medicines only as told by your health care provider. °· If you were prescribed an antibiotic medicine, take it as told by your health care provider. Do not stop taking the antibiotic even if you start to feel better. °· Do not drive or operate heavy machinery while taking prescription pain medicine. °Activity °· Return to your normal activities as told by your health care provider. Ask your health care provider what activities are safe for you. °· Get regular exercise as told by your health care provider. You may be told to take short walks every day and go farther each time. °· Do not lift anything that is heavier than 10 lb (4.5 kg). °General instructions ° °· Do not put anything in your vagina for 6 weeks after your surgery or as told by your health care provider. This includes tampons and douches. °· Do not have sex until your health care provider says you can. °· Do not take baths, swim, or use a hot tub until your health care provider approves. °· Drink enough fluid to keep your urine clear or pale yellow. °· Do not drive for 24 hours if you were given a sedative. °· Keep all follow-up visits as told by your health  care provider. This is important. °Contact a health care provider if: °· Your pain medicine is not helping. °· You have a fever. °· You have redness, swelling, or pain at your incision site. °· You have blood, pus, or a bad-smelling discharge from your vagina. °· You continue to have difficulty urinating. °Get help right away if: °· You have severe abdominal or back pain. °· You have heavy bleeding from your vagina. °· You have chest pain or shortness of breath. °This information is not intended to replace advice given to you by your health care provider. Make sure you discuss any questions you have with your health care provider. °Document Released: 12/23/2015 Document Revised: 02/06/2016 Document Reviewed: 09/15/2015 °Elsevier Interactive Patient Education © 2018 Elsevier Inc. ° °

## 2017-11-04 NOTE — Transfer of Care (Signed)
Immediate Anesthesia Transfer of Care Note  Patient: Erin [name]  Procedure(s) Performed: LAPAROSCOPIC ASSISTED VAGINAL HYSTERECTOMY (N/A Vagina ) LAPAROSCOPIC BILATERAL SALPINGO OOPHORECTOMY (Bilateral Abdomen)  Patient Location: PACU  Anesthesia Type:General  Level of Consciousness: awake, alert  and oriented  Airway & Oxygen Therapy: Patient Spontanous Breathing and Patient connected to nasal cannula oxygen  Post-op Assessment: Report given to RN and Post -op Vital signs reviewed and stable  Post vital signs: Reviewed and stable BP 155/75, HR 93, RR 20, SaO2 100%  Last Vitals:  Vitals:   11/04/17 1350 11/04/17 1748  BP: 117/77 (!) 160/81  Pulse: 72 90  Resp: 16 (!) 24  Temp: 36.7 C 37.5 C  SpO2: 99% 100%    Last Pain:  Vitals:   11/04/17 1350  TempSrc: Oral      Patients Stated Pain Goal: 3 (34/74/25 9563)  Complications: No apparent anesthesia complications

## 2017-11-04 NOTE — Interval H&P Note (Signed)
History and Physical Interval Note:  11/04/2017 2:40 PM  Erin [name]  has presented today for surgery, with the diagnosis of Ovarian Mass Menorrhagia  The various methods of treatment have been discussed with the patient and family. After consideration of risks, benefits and other options for treatment, the patient has consented to  Procedure(s): LAPAROSCOPIC ASSISTED VAGINAL HYSTERECTOMY (N/A) as a surgical intervention .  The patient's history has been reviewed, patient examined, no change in status, stable for surgery.  I have reviewed the patient's chart and labs.  Questions were answered to the patient's satisfaction.     [name]

## 2017-11-04 NOTE — Anesthesia Procedure Notes (Signed)
Procedure Name: Intubation Date/Time: 11/04/2017 3:26 PM Performed by: Raenette Rover, CRNA Pre-anesthesia Checklist: Patient identified, Emergency Drugs available, Suction available and Patient being monitored Patient Re-evaluated:Patient Re-evaluated prior to induction Oxygen Delivery Method: Circle system utilized Preoxygenation: Pre-oxygenation with 100% oxygen Induction Type: IV induction Ventilation: Mask ventilation without difficulty Laryngoscope Size: Mac and 3 Grade View: Grade III Tube type: Oral Tube size: 7.0 mm Number of attempts: 1 Airway Equipment and Method: Stylet Placement Confirmation: ETT inserted through vocal cords under direct vision,  CO2 detector,  positive ETCO2 and breath sounds checked- equal and bilateral Secured at: 22 cm Tube secured with: Tape Dental Injury: Teeth and Oropharynx as per pre-operative assessment  Comments: Anterior airway

## 2017-11-04 NOTE — Op Note (Signed)
Erin [name]  PROCEDURE DATE: 11/04/2017  PREOPERATIVE DIAGNOSIS: abnormal uterine bleeding, ovarian mass, breast cancer  POSTOPERATIVE DIAGNOSIS: Same  PROCEDURE: Laparoscopic assisted vaginal Hysterectomy and bilateral salpingo-oophorectomy  SURGEON:  Dr. Darron Doom  ASSISTANT: Dr. Aletha Halim  ANESTHESIOLOGIST: Barnet Glasgow, MD   EBL: 400cc  INDICATIONS: 57 y.o. yo female G3P0012 with aforementioned preoprative diagnoses here today for definitive surgical management.   Risks of surgery were discussed with the patient including but not limited to: bleeding which may require transfusion or reoperation; infection which may require antibiotics; injury to bowel, bladder, ureters or other surrounding organs; need for additional procedures including laparotomy; thromboembolic phenomenon, incisional problems and other postoperative/anesthesia complications. Written informed consent was obtained.    FINDINGS: 8 week sized uterus, normal adnexa on left right ovary normal, 12 cm right adnexal mass ? Paratubal cyst.   Normal upper abdomen.  PROCEDURE IN DETAIL:   The patient received intravenous antibiotics and had sequential compression devices applied to her lower extremities while in the preoperative area. She was then taken to the operating room where general anesthesia was administered and was found to be adequate.  She was placed in the dorsal lithotomy position, and was prepped and draped in a sterile manner. A Foley catheter was inserted into her bladder and attached to constant drainage and an Hulka uterine manipulator was then advanced into the cervix. After an adequate timeout was performed, attention was then turned to the patient's abdomen where following injection with local anesthetic, a 10 mm incision made at the umbilicus. Peritoneal cavity entered with this incision.  A Hassan trocar was then placed through the incision. Fascia tagged with 0 vicryl on UR-6 in  circumferential fashion.  After intraperitoneal placement was confirmed a pneumoperitoneum was obtained with carbon dioxide gas.  A survey of the patient's pelvis and abdomen revealed the above anatomy.  Bilateral 5-mm lower quadrant ports were then placed under direct visualization after infiltration with local anesthetic. The pelvis was then carefully examined. On the right side, the round ligament was then clamped and transected with the Harmonic scapel device. The infundibulopelvic ligament was then skeletonized and clamped and transected. Excellent hemostasis was noted. The right adnexa was freed from it's attachments. The adnexa was placed in a kidney bag, brought up to the abdominal wall and deflated and removed.  The decision was made to leave the trocars in place and proceed with completing the hysterectomy via the vaginal route .  Attention was then turned to her pelvis.  A weighted speculum was then placed in the vagina, and the anterior and posterior lips of the cervix were grasped bilaterally with Edison Nasuti tenaculums.  The cervix was then injected circumferentially with 0.25% Marcaine with epinephrine solution. The cervix was then circumferentially incised, and the anterior cul-de-sac was entered. The same procedure was performed posteriorly and the posterior cul-de-sac was entered sharply without difficulty. The peritoneum was tagged with a suture to the posterior cuff.  A long weighted speculum was inserted into the posterior cul-de-sac. The Heaney clamp was then used to clamp the uterosacral ligaments on either side. They were then cut and sutured ligated with 0 Vicryl, and were held with a tag for later identification.  The cardinal ligaments were then clamped, cut and ligated bilaterally. The uterine vessels and broad ligaments were then serially clamped with the Heaney clamps, cut, and suture ligated on both sides. The uterus was then delivered posteriorly and the left tubo-ovarian pedicle was  ligated. The left tube and  ovary were grasped. The IP was doubly clasped and tied with free tie followed by suture ligature and the uterus was free and sent to pathology.  There was bleeding from the left uterine artery and a tonsil was used to grasp the bleeder and suture ligated.  After completion of the hysterectomy, all pedicles from the uterosacral ligament to the cornua were examined hemostasis was confirmed. The uterosacral pedicles were then affixed to the ipsilateral vaginal cuff during cuff closure which was performed with a running locked fashion with 0 Vicryl.  One inch vaginal packing with Estrace cream placed in the vagina. All instruments were then removed from the pelvis.   Attention was then returned to her abdomen which was insufflated again with carbon dioxide gas.  The laparoscope was used to survey the operative site, and it was found to be hemostatic. There was some slight oozing at the anterior portion of the cuff on the right. Arista was placed here.  No intraoperative injury to other surrounding organs was noted. The abdomen was desufflated and all instruments were then removed from the patient's abdomen. The umbilical fascia closed with the aforementioned circumferential suture.The port sites were repaired with 3-0 vicryl.  Dermabond placed over incisions. The patient tolerated the procedures well.  All instruments, needles, and sponge counts were correct x 2. The patient was taken to the recovery room awake, extubated and in stable condition.

## 2017-11-05 ENCOUNTER — Encounter (HOSPITAL_COMMUNITY): Payer: Self-pay | Admitting: Family Medicine

## 2017-11-05 DIAGNOSIS — C5701 Malignant neoplasm of right fallopian tube: Secondary | ICD-10-CM | POA: Diagnosis not present

## 2017-11-05 LAB — CBC
HEMATOCRIT: 24.8 % — AB (ref 36.0–46.0)
HEMOGLOBIN: 8.2 g/dL — AB (ref 12.0–15.0)
MCH: 29.8 pg (ref 26.0–34.0)
MCHC: 33.1 g/dL (ref 30.0–36.0)
MCV: 90.2 fL (ref 78.0–100.0)
Platelets: 222 10*3/uL (ref 150–400)
RBC: 2.75 MIL/uL — AB (ref 3.87–5.11)
RDW: 21.7 % — ABNORMAL HIGH (ref 11.5–15.5)
WBC: 17.7 10*3/uL — ABNORMAL HIGH (ref 4.0–10.5)

## 2017-11-05 LAB — GLUCOSE, CAPILLARY: Glucose-Capillary: 140 mg/dL — ABNORMAL HIGH (ref 65–99)

## 2017-11-05 NOTE — Discharge Summary (Signed)
Physician Discharge Summary  Patient ID: Erin [name] MRN: [id number] DOB/AGE: Feb 07, 1961 57 y.o.  Admit date: 11/04/2017 Discharge date:   Admission Diagnoses:  Principal Problem:   Ovarian mass, left Active Problems:   Menorrhagia   S/P vaginal hysterectomy   Discharge Diagnoses:  Same  Past Medical History:  Diagnosis Date  . Anemia   . Arthritis    knees, elbows  . Breast cancer ([place]) 05/2017   left  . Cough 06/03/2017  . Deaf   . Depression   . Dyspnea    states some SOB with ADLs and aanemia   . High cholesterol   . History of blood transfusion 07/2017   [facility]  . Hypertension   . Non-insulin dependent type 2 diabetes mellitus ([place])   . Runny nose 06/03/2017   clear drainage, per pt.  . SVD (spontaneous vaginal delivery)    x 2    Surgeries: Procedure(s): LAPAROSCOPIC ASSISTED VAGINAL HYSTERECTOMY LAPAROSCOPIC BILATERAL SALPINGO OOPHORECTOMY on 11/04/2017   Consultants: None  Discharged Condition: Improved  Hospital Course: Erin [name] is an 57 y.o. female F7P1025 who was admitted 11/04/2017 with above diagnosis for surgery. She was brought to the operating room on 11/04/2017 and underwent the above named procedures.    They were given perioperative antibiotics:  Anti-infectives (From admission, onward)   Start     Dose/Rate Route Frequency Ordered Stop   11/04/17 0316  ceFAZolin (ANCEF) IVPB 2g/100 mL premix     2 g 200 mL/hr over 30 Minutes Intravenous On call to O.R. 11/04/17 8527 11/04/17 1507    .  She was given sequential compression devices, early ambulation, and chemoprophylaxis for DVT prophylaxis. She was ambulating, tolerating po and voiding at time of discharge.  They benefited maximally from their hospital stay and there were no complications.    Recent vital signs:  Vitals:   11/05/17 0631 11/05/17 0746  BP:  111/60  Pulse:  75  Resp: 16 16  Temp:  98.9 F (37.2 C)  SpO2: 97% 100%  Discharge exam: Physical Examination:  General appearance - alert, well appearing, and in no distress Chest - normal effort Abdomen - soft, minimally tender, mildly distended Extremities - peripheral pulses normal, no pedal edema, no clubbing or cyanosis, Homan's sign negative bilaterally   Recent laboratory studies:  Results for orders placed or performed during the hospital encounter of 11/04/17  Glucose, capillary  Result Value Ref Range   Glucose-Capillary 80 65 - 99 mg/dL  CBC  Result Value Ref Range   WBC 17.7 (H) 4.0 - 10.5 K/uL   RBC 2.75 (L) 3.87 - 5.11 MIL/uL   Hemoglobin 8.2 (L) 12.0 - 15.0 g/dL   HCT 24.8 (L) 36.0 - 46.0 %   MCV 90.2 78.0 - 100.0 fL   MCH 29.8 26.0 - 34.0 pg   MCHC 33.1 30.0 - 36.0 g/dL   RDW 21.7 (H) 11.5 - 15.5 %   Platelets 222 150 - 400 K/uL  Glucose, capillary  Result Value Ref Range   Glucose-Capillary 186 (H) 65 - 99 mg/dL  Glucose, capillary  Result Value Ref Range   Glucose-Capillary 140 (H) 65 - 99 mg/dL    Discharge Medications:   Allergies as of 11/05/2017   No Known Allergies     Medication List    TAKE these medications   ACCU-CHEK AVIVA PLUS w/Device Kit Check Blood sugar AM fasting and before dinner   accu-chek softclix lancets Use as instructed   atorvastatin 40 MG tablet  Commonly known as:  LIPITOR Take 1 tablet (40 mg total) by mouth daily.   cholecalciferol 1000 units tablet Commonly known as:  VITAMIN D Take 1,000 Units by mouth daily.   ferrous sulfate 325 (65 FE) MG tablet TAKE 1 TABLET BY MOUTH EVERY DAY WITH BREAKFAST   FISH OIL OMEGA-3 PO Take 1 capsule by mouth daily.   glucose blood test strip Commonly known as:  ACCU-CHEK AVIVA Use as instructed   lidocaine-prilocaine cream Commonly known as:  EMLA Apply 1 application topically as needed (for port access).   lisinopril 2.5 MG tablet Commonly known as:  PRINIVIL,ZESTRIL Take 1 tablet (2.5 mg total) by mouth daily.   metFORMIN 500 MG 24 hr tablet Commonly known as:  GLUCOPHAGE  XR Take 2 tablets (1,000 mg total) by mouth daily with breakfast.   oxyCODONE-acetaminophen 5-325 MG tablet Commonly known as:  PERCOCET/ROXICET Take 1-2 tablets by mouth every 6 (six) hours as needed.   TRUEPLUS LANCETS 28G Misc USE AS DIRECTED BY PHYSICIAN       Diagnostic Studies: None  Disposition: 01-Home or Self Care  Discharge Instructions    Call MD for:  difficulty breathing, headache or visual disturbances   Complete by:  As directed    Call MD for:  persistant nausea and vomiting   Complete by:  As directed    Call MD for:  redness, tenderness, or signs of infection (pain, swelling, redness, odor or green/yellow discharge around incision site)   Complete by:  As directed    Call MD for:  severe uncontrolled pain   Complete by:  As directed    Call MD for:  temperature >100.4   Complete by:  As directed    Diet - low sodium heart healthy   Complete by:  As directed    Discharge patient   Complete by:  As directed    Discharge disposition:  01-Home or Self Care   Discharge patient date:  11/05/2017   Discontinue IV   Complete by:  As directed    Driving Restrictions   Complete by:  As directed    None if needing narcotic pain meds   Increase activity slowly   Complete by:  As directed    Lifting restrictions   Complete by:  As directed    Nothing > 20 lbs x 6 wks   No wound care   Complete by:  As directed    Sexual Activity Restrictions   Complete by:  As directed    Nothing per vagina x 6 wks, no baths      Follow-up [place] for [place] In 2 weeks.   Specialty:  Obstetrics and Gynecology Why:  postop check, they will call you with an appointment Contact information: [facility] [place] [phone]           Signed: Donnamae [name] 11/05/2017, 12:44 PM

## 2017-11-05 NOTE — Anesthesia Postprocedure Evaluation (Signed)
Anesthesia Post Note  Patient: Erin [name]  Procedure(s) Performed: LAPAROSCOPIC ASSISTED VAGINAL HYSTERECTOMY (N/A Vagina ) LAPAROSCOPIC BILATERAL SALPINGO OOPHORECTOMY (Bilateral Abdomen)     Patient location during evaluation: [facility] Anesthesia Type: General Level of consciousness: awake, awake and alert, oriented and patient cooperative Pain management: pain level controlled Vital Signs Assessment: post-procedure vital signs reviewed and stable Respiratory status: spontaneous breathing, nonlabored ventilation and respiratory function stable Cardiovascular status: stable Postop Assessment: no apparent nausea or vomiting Anesthetic complications: no Comments: Patient's mother helped with sign language.    Last Vitals:  Vitals:   11/05/17 0631 11/05/17 0746  BP:  111/60  Pulse:  75  Resp: 16 16  Temp:  37.2 C  SpO2: 97% 100%    Last Pain:  Vitals:   11/05/17 0746  TempSrc: Oral  PainSc:    Pain Goal: Patients Stated Pain Goal: 3 (11/04/17 1915)               [name] L

## 2017-11-05 NOTE — Plan of Care (Signed)
  Education: Knowledge of General Education information will improve 11/05/2017 0209 - Progressing by Lars Masson, RN   Clinical Measurements: Ability to maintain clinical measurements within normal limits will improve 11/05/2017 0209 - Progressing by Lars Masson, RN   Clinical Measurements: Will remain free from infection 11/05/2017 0209 - Progressing by Lars Masson, RN   Activity: Risk for activity intolerance will decrease 11/05/2017 0209 - Progressing by Lars Masson, RN   Nutrition: Adequate nutrition will be maintained 11/05/2017 0209 - Progressing by Lars Masson, RN

## 2017-11-05 NOTE — Addendum Note (Signed)
Addendum  created 11/05/17 0805 by Raenette Rover, CRNA   Sign clinical note

## 2017-11-05 NOTE — Anesthesia Postprocedure Evaluation (Signed)
Anesthesia Post Note  Patient: Erin [name]  Procedure(s) Performed: LAPAROSCOPIC ASSISTED VAGINAL HYSTERECTOMY (N/A Vagina ) LAPAROSCOPIC BILATERAL SALPINGO OOPHORECTOMY (Bilateral Abdomen)     Patient location during evaluation: PACU Anesthesia Type: General Level of consciousness: awake and alert Pain management: pain level controlled Vital Signs Assessment: post-procedure vital signs reviewed and stable Respiratory status: spontaneous breathing, nonlabored ventilation, respiratory function stable and patient connected to nasal cannula oxygen Cardiovascular status: blood pressure returned to baseline and stable Postop Assessment: no apparent nausea or vomiting Anesthetic complications: no    Last Vitals:  Vitals:   11/05/17 0452 11/05/17 0631  BP: (!) 98/45   Pulse: 72   Resp: 18 16  Temp: 36.6 C   SpO2: 100% 97%    Last Pain:  Vitals:   11/05/17 0631  TempSrc:   PainSc: 1    Pain Goal: Patients Stated Pain Goal: 3 (11/04/17 1915)               Barnet Glasgow

## 2017-11-06 LAB — BPAM RBC
Blood Product Expiration Date: 201902252359
Blood Product Expiration Date: 201903152359
ISSUE DATE / TIME: 201902221115
Unit Type and Rh: 600
Unit Type and Rh: 9500

## 2017-11-06 LAB — TYPE AND SCREEN
ABO/RH(D): A POS
Antibody Screen: POSITIVE
PT AG Type: NEGATIVE
UNIT DIVISION: 0
Unit division: 0

## 2017-11-09 ENCOUNTER — Encounter: Payer: Self-pay | Admitting: Gastroenterology

## 2017-11-09 ENCOUNTER — Other Ambulatory Visit: Payer: Self-pay

## 2017-11-09 DIAGNOSIS — C50512 Malignant neoplasm of lower-outer quadrant of left female breast: Secondary | ICD-10-CM

## 2017-11-09 DIAGNOSIS — Z171 Estrogen receptor negative status [ER-]: Principal | ICD-10-CM

## 2017-11-10 ENCOUNTER — Inpatient Hospital Stay: Payer: Medicaid Other

## 2017-11-10 VITALS — BP 117/61 | HR 82 | Temp 98.4°F | Resp 18

## 2017-11-10 DIAGNOSIS — Z171 Estrogen receptor negative status [ER-]: Principal | ICD-10-CM

## 2017-11-10 DIAGNOSIS — C50512 Malignant neoplasm of lower-outer quadrant of left female breast: Secondary | ICD-10-CM

## 2017-11-10 DIAGNOSIS — Z5112 Encounter for antineoplastic immunotherapy: Secondary | ICD-10-CM | POA: Diagnosis not present

## 2017-11-10 LAB — CMP (CANCER CENTER ONLY)
ALBUMIN: 3.2 g/dL — AB (ref 3.5–5.0)
ALK PHOS: 78 U/L (ref 40–150)
ALT: 14 U/L (ref 0–55)
AST: 11 U/L (ref 5–34)
Anion gap: 12 — ABNORMAL HIGH (ref 3–11)
BUN: 11 mg/dL (ref 7–26)
CALCIUM: 9.7 mg/dL (ref 8.4–10.4)
CHLORIDE: 104 mmol/L (ref 98–109)
CO2: 25 mmol/L (ref 22–29)
Creatinine: 0.73 mg/dL (ref 0.60–1.10)
GFR, Est AFR Am: >60 mL/min (ref >60)
GFR, Estimated: >60 mL/min (ref >60)
GLUCOSE: 113 mg/dL (ref 70–140)
Potassium: 3.9 mmol/L (ref 3.5–5.1)
SODIUM: 141 mmol/L (ref 136–145)
Total Bilirubin: 0.4 mg/dL (ref 0.2–1.2)
Total Protein: 7.1 g/dL (ref 6.4–8.3)

## 2017-11-10 LAB — CBC WITH DIFFERENTIAL (CANCER CENTER ONLY)
BASOS PCT: 0 %
Basophils Absolute: 0 10*3/uL (ref 0.0–0.1)
Eosinophils Absolute: 0.2 10*3/uL (ref 0.0–0.5)
Eosinophils Relative: 1 %
HEMATOCRIT: 28.6 % — AB (ref 34.8–46.6)
HEMOGLOBIN: 9 g/dL — AB (ref 11.6–15.9)
LYMPHS ABS: 2.9 10*3/uL (ref 0.9–3.3)
LYMPHS PCT: 21 %
MCH: 28.8 pg (ref 25.1–34.0)
MCHC: 31.5 g/dL (ref 31.5–36.0)
MCV: 91.7 fL (ref 79.5–101.0)
MONO ABS: 0.9 10*3/uL (ref 0.1–0.9)
MONOS PCT: 6 %
Neutro Abs: 9.7 10*3/uL — ABNORMAL HIGH (ref 1.5–6.5)
Neutrophils Relative %: 72 %
Platelet Count: 274 10*3/uL (ref 145–400)
RBC: 3.12 MIL/uL — ABNORMAL LOW (ref 3.70–5.45)
RDW: 20.5 % — AB (ref 11.2–14.5)
WBC Count: 13.6 10*3/uL — ABNORMAL HIGH (ref 3.9–10.3)

## 2017-11-10 MED ORDER — DIPHENHYDRAMINE HCL 50 MG/ML IJ SOLN
INTRAMUSCULAR | Status: AC
  Filled 2017-11-10: qty 1

## 2017-11-10 MED ORDER — SODIUM CHLORIDE 0.9 % IV SOLN
Freq: Once | INTRAVENOUS | Status: AC
  Administered 2017-11-10: 08:00:00 via INTRAVENOUS

## 2017-11-10 MED ORDER — SODIUM CHLORIDE 0.9% FLUSH
10.0000 mL | INTRAVENOUS | Status: DC | PRN
  Administered 2017-11-10: 10 mL
  Filled 2017-11-10: qty 10

## 2017-11-10 MED ORDER — ACETAMINOPHEN 325 MG PO TABS
ORAL_TABLET | ORAL | Status: AC
  Filled 2017-11-10: qty 2

## 2017-11-10 MED ORDER — DIPHENHYDRAMINE HCL 50 MG/ML IJ SOLN
50.0000 mg | Freq: Once | INTRAMUSCULAR | Status: AC
  Administered 2017-11-10: 50 mg via INTRAVENOUS

## 2017-11-10 MED ORDER — ACETAMINOPHEN 325 MG PO TABS
650.0000 mg | ORAL_TABLET | Freq: Once | ORAL | Status: AC
Start: 2017-11-10 — End: 2017-11-10
  Administered 2017-11-10: 650 mg via ORAL

## 2017-11-10 MED ORDER — DIPHENHYDRAMINE HCL 25 MG PO CAPS: 50.0000 mg | ORAL_CAPSULE | Freq: Once | ORAL | Status: DC

## 2017-11-10 MED ORDER — TRASTUZUMAB CHEMO 150 MG IV SOLR
450.0000 mg | Freq: Once | INTRAVENOUS | Status: AC
  Administered 2017-11-10: 450 mg via INTRAVENOUS
  Filled 2017-11-10: qty 21.43

## 2017-11-10 MED ORDER — HEPARIN SOD (PORK) LOCK FLUSH 100 UNIT/ML IV SOLN
500.0000 [IU] | Freq: Once | INTRAVENOUS | Status: AC | PRN
  Administered 2017-11-10: 500 [IU]
  Filled 2017-11-10: qty 5

## 2017-11-10 NOTE — Patient Instructions (Signed)
[facility] Discharge Instructions for Patients Receiving Chemotherapy  Today you received the following chemotherapy agents Herceptin.  To help prevent nausea and vomiting after your treatment, we encourage you to take your nausea medication as directed   If you develop nausea and vomiting that is not controlled by your nausea medication, call the clinic.   BELOW ARE SYMPTOMS THAT SHOULD BE REPORTED IMMEDIATELY:  *FEVER GREATER THAN 100.5 F  *CHILLS WITH OR WITHOUT FEVER  NAUSEA AND VOMITING THAT IS NOT CONTROLLED WITH YOUR NAUSEA MEDICATION  *UNUSUAL SHORTNESS OF BREATH  *UNUSUAL BRUISING OR BLEEDING  TENDERNESS IN MOUTH AND THROAT WITH OR WITHOUT PRESENCE OF ULCERS  *URINARY PROBLEMS  *BOWEL PROBLEMS  UNUSUAL RASH Items with * indicate a potential emergency and should be followed up as soon as possible.  Feel free to call the clinic should you have any questions or concerns. The clinic phone number is [phone].  Please show the [facility] at check-in to the Emergency Department and triage nurse.  Ferumoxytol injection What is this medicine? FERUMOXYTOL is an iron complex. Iron is used to make healthy red blood cells, which carry oxygen and nutrients throughout the body. This medicine is used to treat iron deficiency anemia in people with chronic kidney disease. This medicine may be used for other purposes; ask your health care provider or pharmacist if you have questions. COMMON BRAND NAME(S): Feraheme What should I tell my health care provider before I take this medicine? They need to know if you have any of these conditions: -anemia not caused by low iron levels -high levels of iron in the blood -magnetic resonance imaging (MRI) test scheduled -an unusual or allergic reaction to iron, other medicines, foods, dyes, or preservatives -pregnant or trying to get pregnant -breast-feeding How should I use this medicine? This medicine is for  injection into a vein. It is given by a health care professional in a hospital or clinic setting. Talk to your pediatrician regarding the use of this medicine in children. Special care may be needed. Overdosage: If you think you have taken too much of this medicine contact a poison control center or emergency room at once. NOTE: This medicine is only for you. Do not share this medicine with others. What if I miss a dose? It is important not to miss your dose. Call your doctor or health care professional if you are unable to keep an appointment. What may interact with this medicine? This medicine may interact with the following medications: -other iron products This list may not describe all possible interactions. Give your health care provider a list of all the medicines, herbs, non-prescription drugs, or dietary supplements you use. Also tell them if you smoke, drink alcohol, or use illegal drugs. Some items may interact with your medicine. What should I watch for while using this medicine? Visit your doctor or healthcare professional regularly. Tell your doctor or healthcare professional if your symptoms do not start to get better or if they get worse. You may need blood work done while you are taking this medicine. You may need to follow a special diet. Talk to your doctor. Foods that contain iron include: whole grains/cereals, dried fruits, beans, or peas, leafy green vegetables, and organ meats (liver, kidney). What side effects may I notice from receiving this medicine? Side effects that you should report to your doctor or health care professional as soon as possible: -allergic reactions like skin rash, itching or hives, swelling of the face,  lips, or tongue -breathing problems -changes in blood pressure -feeling faint or lightheaded, falls -fever or chills -flushing, sweating, or hot feelings -swelling of the ankles or feet Side effects that usually do not require medical attention  (report to your doctor or health care professional if they continue or are bothersome): -diarrhea -headache -nausea, vomiting -stomach pain This list may not describe all possible side effects. Call your doctor for medical advice about side effects. You may report side effects to FDA at 1-800-FDA-1088. Where should I keep my medicine? This drug is given in a hospital or clinic and will not be stored at home. NOTE: This sheet is a summary. It may not cover all possible information. If you have questions about this medicine, talk to your doctor, pharmacist, or health care provider.  2018 Elsevier/Gold Standard (2015-10-03 12:41:49)

## 2017-11-12 ENCOUNTER — Telehealth: Payer: Self-pay | Admitting: Family Medicine

## 2017-11-12 NOTE — Telephone Encounter (Signed)
Discussed pathology. 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 She has primary breast cancer and is already undergoing treatment for that. I have discussed the case with Dr. Denman George who will call to schedule her a f/u visit on Monday. She will discuss BRCA testing with patient and add on to her chemo which is already being done. All of this was discussed through a video interpreter with the patient. She has a f/u postop check with me on 3/13.

## 2017-11-16 ENCOUNTER — Ambulatory Visit
Admission: RE | Admit: 2017-11-16 | Discharge: 2017-11-16 | Disposition: A | Discharge status: Home / Self Care | Payer: Medicaid Other | Source: Ambulatory Visit | Attending: Radiation Oncology | Admitting: Radiation Oncology

## 2017-11-16 DIAGNOSIS — Z171 Estrogen receptor negative status [ER-]: Secondary | ICD-10-CM | POA: Diagnosis not present

## 2017-11-16 DIAGNOSIS — C50512 Malignant neoplasm of lower-outer quadrant of left female breast: Secondary | ICD-10-CM

## 2017-11-16 DIAGNOSIS — Z510 Encounter for antineoplastic radiation therapy: Secondary | ICD-10-CM | POA: Diagnosis not present

## 2017-11-16 NOTE — Progress Notes (Signed)
  Radiation Oncology         (336) [phone] ________________________________  Name: Erin [name] MRN: [id number]  Date: 11/16/2017  DOB: Aug 31, 1961  DIAGNOSIS:     ICD-10-CM   1. Malignant neoplasm of lower-outer quadrant of left breast of female, estrogen receptor negative ([place]) C50.512    Z17.1      SIMULATION AND TREATMENT PLANNING NOTE  The patient presented for simulation prior to beginning her course of radiation treatment for her diagnosis of left-sided breast cancer. The patient was placed in a supine position on a breast board. A customized vac-lock bag was constructed and this complex treatment device will be used on a daily basis during her treatment. In this fashion, a CT scan was obtained through the chest area and an isocenter was placed near the chest wall within the breast.  The patient will be planned to receive a course of radiation initially to a dose of 50.4 Gy. This will consist of a whole breast radiotherapy technique. To accomplish this, 2 customized blocks have been designed which will correspond to medial and lateral whole breast tangent fields. This treatment will be accomplished at 1.8 Gy per fraction. A forward planning technique will also be evaluated to determine if this approach improves the plan. It is anticipated that the patient will then receive a 10 Gy boost to the seroma cavity which has been contoured. This will be accomplished at 2 Gy per fraction.   This initial treatment will consist of a 3-D conformal technique. The seroma has been contoured as the primary target structure. Additionally, dose volume histograms of both this target as well as the lungs and heart will also be evaluated. Such an approach is necessary to ensure that the target area is adequately covered while the nearby critical  normal structures are adequately spared.  Plan:  The final anticipated total dose therefore will correspond to 60.4  Gy.    _______________________________   Jodelle Gross, MD, PhD

## 2017-11-16 NOTE — Progress Notes (Signed)
  Radiation Oncology         (336) [phone] ________________________________  Name: Erin [name] MRN: [id number]  Date: 11/16/2017  DOB: [birth year]  Optical Surface Tracking Plan:  Since intensity modulated radiotherapy (IMRT) and 3D conformal radiation treatment methods are predicated on accurate and precise positioning for treatment, intrafraction motion monitoring is medically necessary to ensure accurate and safe treatment delivery.  The ability to quantify intrafraction motion without excessive ionizing radiation dose can only be performed with optical surface tracking. Accordingly, surface imaging offers the opportunity to obtain 3D measurements of patient position throughout IMRT and 3D treatments without excessive radiation exposure.  I am ordering optical surface tracking for this patient's upcoming course of radiotherapy. ________________________________  Kyung Rudd, MD 11/16/2017 1:19 PM    Reference:   Ursula Alert, J, et al. Surface imaging-based analysis of intrafraction motion for breast radiotherapy patients.Journal of [place], n. 6, nov. 2014. ISSN [id number].   Available at: <http://www.jacmp.org/index.php/jacmp/article/view/4957>.

## 2017-11-17 ENCOUNTER — Telehealth: Payer: Self-pay | Admitting: *Deleted

## 2017-11-17 NOTE — Telephone Encounter (Signed)
Called and left a message for the patient that her appt is on March 11th at [facility] with Dr. Denman George.

## 2017-11-18 ENCOUNTER — Ambulatory Visit (INDEPENDENT_AMBULATORY_CARE_PROVIDER_SITE_OTHER)
Admission: RE | Admit: 2017-11-18 | Discharge: 2017-11-18 | Disposition: A | Discharge status: Home / Self Care | Payer: Medicaid Other | Source: Ambulatory Visit | Attending: Internal Medicine | Admitting: Internal Medicine

## 2017-11-18 ENCOUNTER — Other Ambulatory Visit: Payer: Self-pay

## 2017-11-18 ENCOUNTER — Ambulatory Visit (HOSPITAL_COMMUNITY)
Admission: RE | Admit: 2017-11-18 | Discharge: 2017-11-18 | Disposition: A | Discharge status: Home / Self Care | Payer: Medicaid Other | Source: Ambulatory Visit | Attending: Adult Health | Admitting: Adult Health

## 2017-11-18 ENCOUNTER — Encounter (HOSPITAL_COMMUNITY): Payer: Self-pay | Admitting: Internal Medicine

## 2017-11-18 VITALS — BP 144/79 | HR 72 | Wt 157.8 lb

## 2017-11-18 DIAGNOSIS — Z9221 Personal history of antineoplastic chemotherapy: Secondary | ICD-10-CM | POA: Diagnosis not present

## 2017-11-18 DIAGNOSIS — C50912 Malignant neoplasm of unspecified site of left female breast: Secondary | ICD-10-CM | POA: Diagnosis present

## 2017-11-18 DIAGNOSIS — M1990 Unspecified osteoarthritis, unspecified site: Secondary | ICD-10-CM | POA: Insufficient documentation

## 2017-11-18 DIAGNOSIS — E7800 Pure hypercholesterolemia, unspecified: Secondary | ICD-10-CM | POA: Diagnosis not present

## 2017-11-18 DIAGNOSIS — D649 Anemia, unspecified: Secondary | ICD-10-CM | POA: Insufficient documentation

## 2017-11-18 DIAGNOSIS — C50512 Malignant neoplasm of lower-outer quadrant of left female breast: Secondary | ICD-10-CM | POA: Diagnosis not present

## 2017-11-18 DIAGNOSIS — H9190 Unspecified hearing loss, unspecified ear: Secondary | ICD-10-CM | POA: Diagnosis not present

## 2017-11-18 DIAGNOSIS — Z171 Estrogen receptor negative status [ER-]: Secondary | ICD-10-CM | POA: Diagnosis not present

## 2017-11-18 DIAGNOSIS — I10 Essential (primary) hypertension: Secondary | ICD-10-CM | POA: Diagnosis not present

## 2017-11-18 DIAGNOSIS — Z79899 Other long term (current) drug therapy: Secondary | ICD-10-CM | POA: Diagnosis not present

## 2017-11-18 DIAGNOSIS — E119 Type 2 diabetes mellitus without complications: Secondary | ICD-10-CM | POA: Diagnosis not present

## 2017-11-18 DIAGNOSIS — Z833 Family history of diabetes mellitus: Secondary | ICD-10-CM | POA: Insufficient documentation

## 2017-11-18 DIAGNOSIS — Z8249 Family history of ischemic heart disease and other diseases of the circulatory system: Secondary | ICD-10-CM | POA: Diagnosis not present

## 2017-11-18 DIAGNOSIS — Z90710 Acquired absence of both cervix and uterus: Secondary | ICD-10-CM | POA: Diagnosis not present

## 2017-11-18 DIAGNOSIS — Z7984 Long term (current) use of oral hypoglycemic drugs: Secondary | ICD-10-CM | POA: Insufficient documentation

## 2017-11-18 NOTE — Progress Notes (Signed)
[facility] New CONSULT NOTE   Referring Physician: Dr [name]  Primary Care: Primary Cardiologist:None   HPI: Erin [name] is 57 year old with h/o deaf, DMII, HTN, left breat cancer referred to the Cardio- Oncology clinic by Dr [name] .   Feeling fine. Tolerating chemo well. Went for hysterectomy recently and found some concerning cell. Denies edema, SOB or PND.   Malignant neoplasm of lower-outer quadrant of left breast of female, estrogen receptor negative ([place])   05/11/2017 Initial Diagnosis    Left breast biopsy 3:30 position 6 cm from nipple: IDC with DCIS, lymphovascular invasion present, grade 2-3, ER 0%, PR 0%, HER-2 positive ratio 2.28, Ki-67 20%, 1 cm lesion left breast, T1b N0 stage IA clinical stage      06/09/2017 Surgery    Left lumpectomy: IDC grade 3, 1.2 cm, DCIS is present, margins negative, 0/6 lymph nodes negative, ER 0%, PR 0%, HER-2 positive ratio 2.28, Ki-67 20%, T1 BN 0 stage IA      07/09/2017 -  Adjuvant Chemotherapy    Taxol/Herceptin     Echo 11/18/17 EF 55-60% Grade I GLS -18.1 LS' 11.3   ECHO 08/17/2017: EF 55-60% GLS -18.7% RV normal.  ECHO 05/2017 EF 60-65% Lat S' GLS     Past Medical History:  Diagnosis Date  . Anemia   . Arthritis    knees, elbows  . Breast cancer ([place]) 05/2017   left  . Cough 06/03/2017  . Deaf   . Depression   . Dyspnea    states some SOB with ADLs and aanemia   . High cholesterol   . History of blood transfusion 07/2017   [facility]  . Hypertension   . Non-insulin dependent type 2 diabetes mellitus ([place])   . Runny nose 06/03/2017   clear drainage, per pt.  . SVD (spontaneous vaginal delivery)    x 2    Current Outpatient Medications  Medication Sig Dispense Refill  . atorvastatin (LIPITOR) 40 MG tablet Take 1 tablet (40 mg total) by mouth daily. 90 tablet 1  . Blood Glucose Monitoring Suppl (ACCU-CHEK AVIVA PLUS) W/DEVICE KIT Check Blood sugar AM fasting and before dinner 1 kit 0  .  cholecalciferol (VITAMIN D) 1000 units tablet Take 1,000 Units by mouth daily.    . ferrous sulfate 325 (65 FE) MG tablet TAKE 1 TABLET BY MOUTH EVERY DAY WITH BREAKFAST 60 tablet 2  . glucose blood (ACCU-CHEK AVIVA) test strip Use as instructed 100 each 12  . Lancet Devices (ACCU-CHEK SOFTCLIX) lancets Use as instructed 1 each 0  . lidocaine-prilocaine (EMLA) cream Apply 1 application topically as needed (for port access).    Marland Kitchen lisinopril (PRINIVIL,ZESTRIL) 2.5 MG tablet Take 1 tablet (2.5 mg total) by mouth daily. 30 tablet 5  . metFORMIN (GLUCOPHAGE XR) 500 MG 24 hr tablet Take 2 tablets (1,000 mg total) by mouth daily with breakfast. 60 tablet 11  . Omega-3 Fatty Acids (FISH OIL OMEGA-3 PO) Take 1 capsule by mouth daily.     Marland Kitchen oxyCODONE-acetaminophen (PERCOCET/ROXICET) 5-325 MG tablet Take 1-2 tablets by mouth every 6 (six) hours as needed. 30 tablet 0  . TRUEPLUS LANCETS 28G MISC USE AS DIRECTED BY PHYSICIAN 100 each 0   Current Facility-Administered Medications  Medication Dose Route Frequency Provider Last Rate Last Dose  . 0.9 %  sodium chloride infusion  500 mL Intravenous Once Nandigam, Venia Minks, MD        No Known Allergies    Social History  Socioeconomic History  . Marital status: Legally Separated    Spouse name: Not on file  . Number of children: Not on file  . Years of education: Not on file  . Highest education level: Not on file  Social Needs  . Financial resource strain: Not on file  . Food insecurity - worry: Not on file  . Food insecurity - inability: Not on file  . Transportation needs - medical: Not on file  . Transportation needs - non-medical: Not on file  Occupational History  . Not on file  Tobacco Use  . Smoking status: Never Smoker  . Smokeless tobacco: Never Used  Substance and Sexual Activity  . Alcohol use: No  . Drug use: No  . Sexual activity: No    Birth control/protection: None  Other Topics Concern  . Not on file  Social History  Narrative   1 boy and 1 girl   Resides in [place]   Seperated      Family History  Problem Relation Age of Onset  . Hypertension Mother   . Diabetes Father   . Hypertension Father   . Hypertension Sister   . Cancer Paternal Grandmother   . Diabetes Paternal Grandmother   . Colon cancer Paternal Grandmother   . CAD Brother   . Breast cancer Paternal Aunt   . Breast cancer Paternal Aunt     Vitals:   11/18/17 1401  BP: (!) 144/79  Pulse: 72  SpO2: 100%  Weight: 157 lb 12 oz (71.6 kg)    PHYSICAL EXAM: General:  Well appearing. No resp difficulty. Deaf her with sign language interpreter  HEENT: normal Neck: supple. no JVD. Carotids 2+ bilat; no bruits. No lymphadenopathy or thryomegaly appreciated. Cor: PMI nondisplaced. Regular rate & rhythm. No rubs, gallops or murmurs. Lungs: clear Abdomen: soft, nontender, nondistended. No hepatosplenomegaly. No bruits or masses. Good bowel sounds. Extremities: no cyanosis, clubbing, rash, edema Neuro: alert & orientedx3, cranial nerves grossly intact. moves all 4 extremities w/o difficulty. Affect pleasant   ASSESSMENT & PLAN:  1. Breast Cancer, left   T1b N0 stage IA clinical stage  - I reviewed echos personally. EF and Doppler parameters stable. No HF on exam. Continue Herceptin.   Erin Bickers MD 2:54 PM

## 2017-11-18 NOTE — Addendum Note (Signed)
Encounter addended by: Scarlette Calico, RN on: 11/18/2017 3:11 PM  Actions taken: Order list changed, Diagnosis association updated

## 2017-11-18 NOTE — Patient Instructions (Signed)
Your physician recommends that you schedule a follow-up appointment in: 3 months with echocardiogram  

## 2017-11-18 NOTE — Addendum Note (Signed)
Encounter addended by: Scarlette Calico, RN on: 11/18/2017 3:05 PM  Actions taken: Sign clinical note

## 2017-11-18 NOTE — Progress Notes (Signed)
  Echocardiogram 2D Echocardiogram has been performed.  Erin [name] 11/18/2017, 1:59 PM

## 2017-11-19 ENCOUNTER — Telehealth: Payer: Self-pay | Admitting: Hematology and Oncology

## 2017-11-19 NOTE — Telephone Encounter (Signed)
Patient called in with questions regarding her 11/25/17 appointment.  I transferred call to [facility] in [place] scheduling

## 2017-11-22 ENCOUNTER — Inpatient Hospital Stay: Payer: Medicaid Other | Attending: Hematology and Oncology | Admitting: Gynecologic Oncology

## 2017-11-22 ENCOUNTER — Encounter: Payer: Self-pay | Admitting: Gynecologic Oncology

## 2017-11-22 VITALS — BP 144/78 | HR 83 | Temp 97.8°F | Resp 18 | Ht 59.0 in | Wt 158.2 lb

## 2017-11-22 DIAGNOSIS — C5700 Malignant neoplasm of unspecified fallopian tube: Secondary | ICD-10-CM | POA: Diagnosis not present

## 2017-11-22 DIAGNOSIS — C5701 Malignant neoplasm of right fallopian tube: Secondary | ICD-10-CM | POA: Insufficient documentation

## 2017-11-22 DIAGNOSIS — Z79899 Other long term (current) drug therapy: Secondary | ICD-10-CM | POA: Diagnosis not present

## 2017-11-22 DIAGNOSIS — Z5112 Encounter for antineoplastic immunotherapy: Secondary | ICD-10-CM | POA: Diagnosis not present

## 2017-11-22 DIAGNOSIS — Z90722 Acquired absence of ovaries, bilateral: Secondary | ICD-10-CM | POA: Diagnosis not present

## 2017-11-22 DIAGNOSIS — C50512 Malignant neoplasm of lower-outer quadrant of left female breast: Secondary | ICD-10-CM | POA: Insufficient documentation

## 2017-11-22 DIAGNOSIS — Z7984 Long term (current) use of oral hypoglycemic drugs: Secondary | ICD-10-CM | POA: Diagnosis not present

## 2017-11-22 DIAGNOSIS — Z90710 Acquired absence of both cervix and uterus: Secondary | ICD-10-CM | POA: Diagnosis not present

## 2017-11-22 NOTE — Progress Notes (Signed)
Consult Note: Gyn-Onc  Consult was requested by Dr. Kennon Rounds for the evaluation of Erin [name] 57 y.o. female  CC:  Chief Complaint  Patient presents with  . Malignant neoplasm of fallopian tube, unspecified laterality    Assessment/Plan:  Erin. ADINE HEIMANN  is a 57 y.o.  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  I reviewed her history, pathology reports and discussed her case with treating oncologist, Dr [name].  I am recommending 3-6 cycles of carboplatin and paclitaxel (or alternative doublet agent such as doxil or gem). This can be sequenced immediately following her chest wall radiation, though a restaging CT should be performed immediately pretreatment and this has been ordered.  Given her high grade serous FT cancer she requires genetics consultation and BRCA testing in accordance with NCCN guidelines. This has been scheduled.   I will see Henlee back after she has completed adjuvant chemotherapy to enter surveillance monitoring.   HPI: Erin [name] is a 57 year old woman who is deaf and is seen in consultation at the request of Dr Kennon Rounds for clinical stage IC fallopian tube cancer.   The patient has a history of ER PR negative HER-2 positive breast cancer on the left side which was diagnosed in August 2018.  It was treated with lumpectomy in September 2018 followed by Adriamycin and Taxol chemotherapy.  She was then placed on Herceptin with a plan to commence radiation therapy in March 2019.  She reported menorrhagia with irregular menses and therefore transvaginal ultrasound was performed on June 29, 2017.  It showed a uterus measuring 11.8 x 5.2 x 6.3 cm with a septated fluid-filled structure adjacent to the dome of the uterus.  The endometrium was thickened at 10.2 mm the right ovary measured 3.2 x 2.1 x 2.7 cm and the left ovary measured 9.8 x 7.8 x 11.8  cm.  The ovarian tissue is largely occupied by a complex cyst measuring 7.5 x 9.9 x 9.6 cm with a prominent septation.  There was no free fluid identified.  To follow-up with this ultrasound Dr. Kennon Rounds obtained an MRI of the pelvis on September 06, 2017 which revealed a uterus of normal size measuring 7.5 x 5 x 4.5 cm.  There is a bilobed cystic lesion presumably originating from the left adnexa which is different from the ultrasound report of a right adnexal complex cyst.  The cystic complex measures 12.8 x 10.9 x 8 cm.  The there was no nodularity.  The right ovary appeared normal.  It had no worrisome features.  Ca1 25 was drawn which was normal.  The patient was taken to the operating room on November 04, 2017 for laparoscopic assisted vaginal hysterectomy with BSO.  Surgical findings were significant for an 8-week size uterus with normal adnexa on the left and the right adnexal mass measured personally 12 cm.  It felt to be most consistent with a paratubal cyst.  There is normal upper abdomen reported washings were not obtained.  Surgery was uncomplicated.  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  The remainder of the entire right fallopian tube was submitted and no additional malignancy was identified.  The the associated ovarian cyst on the right was benign the left adnexa was unremarkable.  The uterus was  also normal including the endometrium.  The patient is healed well from her surgery with no complaints.  She has a paternal grandmother who had colon cancer, a maternal half aunt with history of breast cancer, 2 paternal aunts with breast cancer.  There is no family history of ovarian cancer.  She has had 2 prior NSVDs in the past.  She is otherwise fairly healthy.  Current Meds:  Outpatient Encounter Medications as of 11/22/2017  Medication Sig  . atorvastatin (LIPITOR) 40 MG  tablet Take 1 tablet (40 mg total) by mouth daily.  . Blood Glucose Monitoring Suppl (ACCU-CHEK AVIVA PLUS) W/DEVICE KIT Check Blood sugar AM fasting and before dinner  . cholecalciferol (VITAMIN D) 1000 units tablet Take 1,000 Units by mouth daily.  . ferrous sulfate 325 (65 FE) MG tablet TAKE 1 TABLET BY MOUTH EVERY DAY WITH BREAKFAST  . glucose blood (ACCU-CHEK AVIVA) test strip Use as instructed  . Lancet Devices (ACCU-CHEK SOFTCLIX) lancets Use as instructed  . lidocaine-prilocaine (EMLA) cream Apply 1 application topically as needed (for port access).  Marland Kitchen lisinopril (PRINIVIL,ZESTRIL) 2.5 MG tablet Take 1 tablet (2.5 mg total) by mouth daily.  . metFORMIN (GLUCOPHAGE XR) 500 MG 24 hr tablet Take 2 tablets (1,000 mg total) by mouth daily with breakfast.  . Omega-3 Fatty Acids (FISH OIL OMEGA-3 PO) Take 1 capsule by mouth daily.   Marland Kitchen oxyCODONE-acetaminophen (PERCOCET/ROXICET) 5-325 MG tablet Take 1-2 tablets by mouth every 6 (six) hours as needed.  . TRUEPLUS LANCETS 28G MISC USE AS DIRECTED BY PHYSICIAN   Facility-Administered Encounter Medications as of 11/22/2017  Medication  . 0.9 %  sodium chloride infusion    Allergy: No Known Allergies  Social Hx:   Social History   Socioeconomic History  . Marital status: Legally Separated    Spouse name: Not on file  . Number of children: Not on file  . Years of education: Not on file  . Highest education level: Not on file  Social Needs  . Financial resource strain: Not on file  . Food insecurity - worry: Not on file  . Food insecurity - inability: Not on file  . Transportation needs - medical: Not on file  . Transportation needs - non-medical: Not on file  Occupational History  . Not on file  Tobacco Use  . Smoking status: Never Smoker  . Smokeless tobacco: Never Used  Substance and Sexual Activity  . Alcohol use: No  . Drug use: No  . Sexual activity: No    Birth control/protection: None  Other Topics Concern  . Not on file   Social History Narrative   1 boy and 1 girl   Resides in [place]   Seperated    Past Surgical Hx:  Past Surgical History:  Procedure Laterality Date  . BREAST LUMPECTOMY WITH RADIOACTIVE SEED AND SENTINEL LYMPH NODE BIOPSY Left 06/09/2017   Procedure: BREAST LUMPECTOMY WITH RADIOACTIVE SEED AND SENTINEL LYMPH NODE BIOPSY;  Surgeon: Excell Seltzer, MD;  Location: [place];  Service: General;  Laterality: Left;  . COLONOSCOPY  11/02/2017   polyps  . LAPAROSCOPIC ASSISTED VAGINAL HYSTERECTOMY N/A 11/04/2017   Procedure: LAPAROSCOPIC ASSISTED VAGINAL HYSTERECTOMY;  Surgeon: [name], MD;  Location: [facility];  Service: Gynecology;  Laterality: N/A;  . LAPAROSCOPIC BILATERAL SALPINGO OOPHERECTOMY Bilateral 11/04/2017   Procedure: LAPAROSCOPIC BILATERAL SALPINGO OOPHORECTOMY;  Surgeon: [name], MD;  Location: [facility];  Service: Gynecology;  Laterality: Bilateral;  . MULTIPLE TOOTH EXTRACTIONS    .  PORTACATH PLACEMENT Right 06/09/2017   Procedure: INSERTION PORT-A-CATH WITH [place];  Surgeon: Excell Seltzer, MD;  Location: [place];  Service: General;  Laterality: Right;  . TUBAL LIGATION  02/04/2002  . UPPER GI ENDOSCOPY      Past Medical Hx:  Past Medical History:  Diagnosis Date  . Anemia   . Arthritis    knees, elbows  . Breast cancer ([place]) 05/2017   left  . Cough 06/03/2017  . Deaf   . Depression   . Dyspnea    states some SOB with ADLs and aanemia   . High cholesterol   . History of blood transfusion 07/2017   [facility]  . Hypertension   . Non-insulin dependent type 2 diabetes mellitus ([place])   . Runny nose 06/03/2017   clear drainage, per pt.  . SVD (spontaneous vaginal delivery)    x 2    Past Gynecological History:  SVDx 2, s/p hysterectomy Patient's last menstrual period was 07/09/2017.  Family Hx:  Family History  Problem Relation Age of Onset  . Hypertension Mother   . Diabetes Father   . Hypertension Father   .  Hypertension Sister   . Cancer Paternal Grandmother   . Diabetes Paternal Grandmother   . Colon cancer Paternal Grandmother   . CAD Brother   . Breast cancer Paternal Aunt   . Breast cancer Paternal Aunt     Review of Systems:  Constitutional  Feels well,    ENT Normal appearing ears and nares bilaterally Skin/Breast  No rash, sores, jaundice, itching, dryness Cardiovascular  No chest pain, shortness of breath, or edema  Pulmonary  No cough or wheeze.  Gastro Intestinal  No nausea, vomitting, or diarrhoea. No bright red blood per rectum, no abdominal pain, change in bowel movement, or constipation.  Genito Urinary  No frequency, urgency, dysuria,  Musculo Skeletal  No myalgia, arthralgia, joint swelling or pain  Neurologic  No weakness, numbness, change in gait,  Psychology  No depression, anxiety, insomnia.   Vitals:  Blood pressure (!) 144/78, pulse 83, temperature 97.8 F (36.6 C), temperature source Oral, resp. rate 18, height 4' 11"  (1.499 m), weight 158 lb 3.2 oz (71.8 kg), last menstrual period 07/09/2017, SpO2 100 %.  Physical Exam: WD in NAD Neck  Supple NROM, without any enlargements.  Lymph Node Survey No cervical supraclavicular or inguinal adenopathy Cardiovascular  Pulse normal rate, regularity and rhythm. S1 and S2 normal.  Lungs  Clear to auscultation bilateraly, without wheezes/crackles/rhonchi. Good air movement.  Skin  No rash/lesions/breakdown  Psychiatry  Alert and oriented to person, place, and time  Abdomen  Normoactive bowel sounds, abdomen soft, non-tender and obese without evidence of hernia. Well healed incisions. Back No CVA tenderness Genito Urinary  deferred Rectal  deferred Extremities  No bilateral cyanosis, clubbing or edema.   Thereasa Solo, MD  11/22/2017, 10:11 AM

## 2017-11-22 NOTE — Patient Instructions (Addendum)
Dr Denman George will order a CT scan in early May.  See instruction sheet for directions on drinking your contrast. You will then follow-up with Dr [name] for additional doses of chemotherapy for the fallopian tube cancer.  You will be referred to a genetics counselor for evaluation of a possible genetic cause for your cancers.

## 2017-11-23 ENCOUNTER — Ambulatory Visit: Payer: Medicaid Other | Admitting: Radiation Oncology

## 2017-11-24 ENCOUNTER — Ambulatory Visit (INDEPENDENT_AMBULATORY_CARE_PROVIDER_SITE_OTHER): Payer: Medicaid Other | Admitting: Family Medicine

## 2017-11-24 ENCOUNTER — Ambulatory Visit: Payer: Medicaid Other

## 2017-11-24 ENCOUNTER — Telehealth: Payer: Self-pay | Admitting: Hematology and Oncology

## 2017-11-24 ENCOUNTER — Encounter: Payer: Self-pay | Admitting: Family Medicine

## 2017-11-24 VITALS — BP 130/75 | HR 83 | Wt 156.1 lb

## 2017-11-24 DIAGNOSIS — Z9889 Other specified postprocedural states: Secondary | ICD-10-CM

## 2017-11-24 DIAGNOSIS — Z09 Encounter for follow-up examination after completed treatment for conditions other than malignant neoplasm: Secondary | ICD-10-CM

## 2017-11-24 DIAGNOSIS — Z510 Encounter for antineoplastic radiation therapy: Secondary | ICD-10-CM | POA: Diagnosis not present

## 2017-11-24 DIAGNOSIS — C5701 Malignant neoplasm of right fallopian tube: Secondary | ICD-10-CM

## 2017-11-24 NOTE — Assessment & Plan Note (Signed)
F/u per Gyn/Onc

## 2017-11-24 NOTE — Progress Notes (Signed)
   Subjective:    Patient ID: Erin [name] is a 57 y.o. female presenting with Follow-up  on 11/24/2017  HPI: S/p LAVH/BSO on 11/04/2017. Has been doing well. We found a small segment of fallopian tube cancer on pathology. Has seen GYN/Onc and is scheduled to receive a few more rounds of chemo. Reports some bladder pain related to surgery, which is improving. Denies fever, chills, abdominal pain, constipation.  Review of Systems  Constitutional: Negative for chills and fever.  Respiratory: Negative for shortness of breath.   Cardiovascular: Negative for chest pain.  Gastrointestinal: Negative for abdominal pain, nausea and vomiting.  Genitourinary: Negative for dysuria.  Skin: Negative for rash.      Objective:    BP 130/75 (BP Location: Right Arm, Patient Position: Sitting, Cuff Size: Normal)   Pulse 83   Wt 156 lb 1.6 oz (70.8 kg)   LMP 07/09/2017   BMI 31.53 kg/m  Physical Exam  Constitutional: She is oriented to person, place, and time. She appears well-developed and well-nourished. No distress.  HENT:  Head: Normocephalic and atraumatic.  Eyes: No scleral icterus.  Neck: Neck supple.  Cardiovascular: Normal rate.  Pulmonary/Chest: Effort normal.  Abdominal: Soft.  Neurological: She is alert and oriented to person, place, and time.  Skin: Skin is warm and dry.  Incisions are well healed.  Psychiatric: She has a normal mood and affect.        Assessment & Plan:   Problem List Items Addressed This Visit      Unprioritized   Fallopian tube cancer, carcinoma, right ([place])    F/u per Gyn/Onc       Other Visit Diagnoses    Postop check    -  Primary   doing well--no bath or sex or heavy lifting x 3 more weeks.    Return if symptoms worsen or fail to improve.  Erin [name] 11/24/2017 2:01 PM

## 2017-11-24 NOTE — Telephone Encounter (Signed)
Mailed patient calendar of upcoming April appointments per 3/11 sch message.

## 2017-11-25 ENCOUNTER — Ambulatory Visit
Admission: RE | Admit: 2017-11-25 | Discharge: 2017-11-25 | Disposition: A | Discharge status: Home / Self Care | Payer: Medicaid Other | Source: Ambulatory Visit | Attending: Radiation Oncology | Admitting: Radiation Oncology

## 2017-11-25 DIAGNOSIS — Z510 Encounter for antineoplastic radiation therapy: Secondary | ICD-10-CM | POA: Diagnosis not present

## 2017-11-26 ENCOUNTER — Ambulatory Visit
Admission: RE | Admit: 2017-11-26 | Discharge: 2017-11-26 | Disposition: A | Discharge status: Home / Self Care | Payer: Medicaid Other | Source: Ambulatory Visit | Attending: Radiation Oncology | Admitting: Radiation Oncology

## 2017-11-26 DIAGNOSIS — Z171 Estrogen receptor negative status [ER-]: Principal | ICD-10-CM

## 2017-11-26 DIAGNOSIS — C50512 Malignant neoplasm of lower-outer quadrant of left female breast: Secondary | ICD-10-CM

## 2017-11-26 DIAGNOSIS — Z510 Encounter for antineoplastic radiation therapy: Secondary | ICD-10-CM | POA: Diagnosis not present

## 2017-11-26 MED ORDER — RADIAPLEXRX EX GEL
Freq: Once | CUTANEOUS | Status: AC
  Administered 2017-11-26: 10:00:00 via TOPICAL

## 2017-11-26 MED ORDER — ALRA NON-METALLIC DEODORANT (RAD-ONC)
1.0000 "application " | Freq: Once | TOPICAL | Status: AC
  Administered 2017-11-26: 1 via TOPICAL

## 2017-11-26 NOTE — Progress Notes (Signed)

## 2017-11-29 ENCOUNTER — Ambulatory Visit: Payer: Medicaid Other | Admitting: Family Medicine

## 2017-11-29 ENCOUNTER — Other Ambulatory Visit: Payer: Self-pay

## 2017-11-29 ENCOUNTER — Ambulatory Visit: Payer: Medicaid Other | Attending: Nurse Practitioner | Admitting: Nurse Practitioner

## 2017-11-29 ENCOUNTER — Ambulatory Visit
Admission: RE | Admit: 2017-11-29 | Discharge: 2017-11-29 | Disposition: A | Discharge status: Home / Self Care | Payer: Medicaid Other | Source: Ambulatory Visit | Attending: Radiation Oncology | Admitting: Radiation Oncology

## 2017-11-29 ENCOUNTER — Encounter: Payer: Self-pay | Admitting: Nurse Practitioner

## 2017-11-29 VITALS — BP 143/83 | HR 79 | Temp 98.0°F | Ht 61.0 in | Wt 157.0 lb

## 2017-11-29 DIAGNOSIS — E7800 Pure hypercholesterolemia, unspecified: Secondary | ICD-10-CM | POA: Diagnosis not present

## 2017-11-29 DIAGNOSIS — D509 Iron deficiency anemia, unspecified: Secondary | ICD-10-CM

## 2017-11-29 DIAGNOSIS — M19022 Primary osteoarthritis, left elbow: Secondary | ICD-10-CM | POA: Insufficient documentation

## 2017-11-29 DIAGNOSIS — Z8249 Family history of ischemic heart disease and other diseases of the circulatory system: Secondary | ICD-10-CM | POA: Insufficient documentation

## 2017-11-29 DIAGNOSIS — H9190 Unspecified hearing loss, unspecified ear: Secondary | ICD-10-CM | POA: Insufficient documentation

## 2017-11-29 DIAGNOSIS — Z7984 Long term (current) use of oral hypoglycemic drugs: Secondary | ICD-10-CM | POA: Insufficient documentation

## 2017-11-29 DIAGNOSIS — I10 Essential (primary) hypertension: Secondary | ICD-10-CM | POA: Diagnosis present

## 2017-11-29 DIAGNOSIS — E118 Type 2 diabetes mellitus with unspecified complications: Secondary | ICD-10-CM | POA: Diagnosis not present

## 2017-11-29 DIAGNOSIS — Z853 Personal history of malignant neoplasm of breast: Secondary | ICD-10-CM | POA: Diagnosis not present

## 2017-11-29 DIAGNOSIS — Z79899 Other long term (current) drug therapy: Secondary | ICD-10-CM | POA: Insufficient documentation

## 2017-11-29 DIAGNOSIS — M19021 Primary osteoarthritis, right elbow: Secondary | ICD-10-CM | POA: Diagnosis not present

## 2017-11-29 DIAGNOSIS — Z510 Encounter for antineoplastic radiation therapy: Secondary | ICD-10-CM | POA: Diagnosis not present

## 2017-11-29 DIAGNOSIS — M170 Bilateral primary osteoarthritis of knee: Secondary | ICD-10-CM | POA: Insufficient documentation

## 2017-11-29 DIAGNOSIS — F329 Major depressive disorder, single episode, unspecified: Secondary | ICD-10-CM | POA: Diagnosis not present

## 2017-11-29 DIAGNOSIS — C5701 Malignant neoplasm of right fallopian tube: Secondary | ICD-10-CM

## 2017-11-29 DIAGNOSIS — E119 Type 2 diabetes mellitus without complications: Secondary | ICD-10-CM | POA: Insufficient documentation

## 2017-11-29 LAB — GLUCOSE, POCT (MANUAL RESULT ENTRY): POC Glucose: 95 mg/dl (ref 70–99)

## 2017-11-29 LAB — POCT GLYCOSYLATED HEMOGLOBIN (HGB A1C): Hemoglobin A1C: 5.7

## 2017-11-29 MED ORDER — FERROUS SULFATE 325 (65 FE) MG PO TABS: 325.0000 mg | ORAL_TABLET | Freq: Every day | ORAL | 1 refills | Status: DC

## 2017-11-29 MED ORDER — METFORMIN HCL ER (MOD) 1000 MG PO TB24: 1000.0000 mg | ORAL_TABLET | Freq: Every day | ORAL | 0 refills | Status: DC

## 2017-11-29 MED ORDER — LISINOPRIL 10 MG PO TABS: 10.0000 mg | ORAL_TABLET | Freq: Every day | ORAL | 1 refills | Status: DC

## 2017-11-29 NOTE — Progress Notes (Signed)
Assessment & Plan:  Erin [name] was seen today for establish care.  Diagnoses and all orders for this visit:  Essential hypertension -     lisinopril (PRINIVIL,ZESTRIL) 10 MG tablet; Take 1 tablet (10 mg total) by mouth daily. Continue all antihypertensives as prescribed.  Remember to bring in your blood pressure log with you for your follow up appointment.  DASH/Mediterranean Diets are healthier choices for HTN.    Type 2 diabetes mellitus without complication, without long-term current use of insulin (HCC) -     Glucose (CBG) -     HgB A1c -     metFORMIN (GLUMETZA) 1000 MG (MOD) 24 hr tablet; Take 1 tablet (1,000 mg total) by mouth daily with breakfast. Continue blood sugar control as discussed in office today, low carbohydrate diet, and regular physical exercise as tolerated, 150 minutes per week (30 min each day, 5 days per week, or 50 min 3 days per week). Keep blood sugar logs with fasting goal of 80-130 mg/dl, post prandial less than 180.  For Hypoglycemia: BS <60 and Hyperglycemia BS >400; contact the clinic ASAP. Annual eye exams and foot exams are recommended.   Iron deficiency anemia, unspecified iron deficiency anemia type -     ferrous sulfate 325 (65 FE) MG tablet; Take 1 tablet (325 mg total) by mouth daily before breakfast.  Patient has been counseled on age-appropriate routine health concerns for screening and prevention. These are reviewed and up-to-date. Referrals have been placed accordingly. Immunizations are up-to-date or declined.    Subjective:   Chief Complaint  Patient presents with  . Establish Care    Pt's is here to establish for diabetes and hypertension.    HPI Erin [name] 57 y.o. female presents to office today to establish care. Patient is deaf and has an interpreter present.   Essential Hypertension Chronic. Blood Pressure not well controlled today. She endorses medication compliance. Will increase Lisinopril from 2.5 to '10mg'$  daily. Denies  chest pain, shortness of breath, palpitations, lightheadedness, dizziness, headaches or BLE edema.   BP Readings from Last 3 Encounters:  11/29/17 (!) 143/83  11/24/17 130/75  11/22/17 (!) 144/78   Diabetes Mellitus Type 2 Chronic. Well controlled. Endorses medication compliance. Taking metformin but states the pills are very large. She is taking two tablets 2 times per day. Will switch to '1000mg'$  BID. She is not  monitoring her CBGs at home. She denies any visual disturbances or symptoms of hypo or hyperglycemia.  Lab Results  Component Value Date   HGBA1C 5.7 11/29/2017   Hyperlipidemia Patient presents for follow up to hyperlipidemia.  She is medication  and denies chest pain, exertional chest pressure/discomfort, lower extremity edema and tachypnea or statin intolerance including myalgias.  Lab Results  Component Value Date   CHOL 151 04/02/2017   CHOL 152 11/14/2014   CHOL 176 06/06/2010   Lab Results  Component Value Date   HDL 42 04/02/2017   HDL 35 (L) 11/14/2014   HDL 40 06/06/2010   Lab Results  Component Value Date   LDLCALC 79 04/02/2017   LDLCALC 96 11/14/2014   LDLCALC 115 (H) 06/06/2010   Lab Results  Component Value Date   TRIG 148 04/02/2017   TRIG 105 11/14/2014   TRIG 106 06/06/2010   Lab Results  Component Value Date   CHOLHDL 3.6 04/02/2017   CHOLHDL 4.3 11/14/2014   CHOLHDL 4.4 Ratio 06/06/2010  Review of Systems  Constitutional: Negative for fever, malaise/fatigue and weight loss.  HENT:  Positive for hearing loss (deaf). Negative for nosebleeds.   Eyes: Negative.  Negative for blurred vision, double vision and photophobia.  Respiratory: Negative.  Negative for cough and shortness of breath.   Cardiovascular: Negative.  Negative for chest pain, palpitations and leg swelling.  Gastrointestinal: Negative.  Negative for heartburn, nausea and vomiting.  Musculoskeletal: Negative.  Negative for myalgias.  Neurological: Negative.  Negative for  dizziness, focal weakness, seizures and headaches.  Psychiatric/Behavioral: Negative.  Negative for suicidal ideas.    Past Medical History:  Diagnosis Date  . Anemia   . Arthritis    knees, elbows  . Breast cancer ([place]) 05/2017   left  . Cough 06/03/2017  . Deaf   . Depression   . Dyspnea    states some SOB with ADLs and aanemia   . High cholesterol   . History of blood transfusion 07/2017   [facility]  . Hypertension   . Non-insulin dependent type 2 diabetes mellitus ([place])   . Runny nose 06/03/2017   clear drainage, per pt.  . SVD (spontaneous vaginal delivery)    x 2    Past Surgical History:  Procedure Laterality Date  . BREAST LUMPECTOMY WITH RADIOACTIVE SEED AND SENTINEL LYMPH NODE BIOPSY Left 06/09/2017   Procedure: BREAST LUMPECTOMY WITH RADIOACTIVE SEED AND SENTINEL LYMPH NODE BIOPSY;  Surgeon: Excell Seltzer, MD;  Location: [facility];  Service: General;  Laterality: Left;  . COLONOSCOPY  11/02/2017   polyps  . LAPAROSCOPIC ASSISTED VAGINAL HYSTERECTOMY N/A 11/04/2017   Procedure: LAPAROSCOPIC ASSISTED VAGINAL HYSTERECTOMY;  Surgeon: [name], MD;  Location: [facility];  Service: Gynecology;  Laterality: N/A;  . LAPAROSCOPIC BILATERAL SALPINGO OOPHERECTOMY Bilateral 11/04/2017   Procedure: LAPAROSCOPIC BILATERAL SALPINGO OOPHORECTOMY;  Surgeon: [name], MD;  Location: [facility];  Service: Gynecology;  Laterality: Bilateral;  . MULTIPLE TOOTH EXTRACTIONS    . PORTACATH PLACEMENT Right 06/09/2017   Procedure: INSERTION PORT-A-CATH WITH [place];  Surgeon: Excell Seltzer, MD;  Location: [facility];  Service: General;  Laterality: Right;  . TUBAL LIGATION  02/04/2002  . UPPER GI ENDOSCOPY      Family History  Problem Relation Age of Onset  . Hypertension Mother   . Diabetes Father   . Hypertension Sister   . Cancer Paternal Grandmother   . Diabetes Paternal Grandmother   . Colon cancer Paternal Grandmother   . CAD Brother   .  Diabetes Brother   . Breast cancer Paternal Aunt   . Breast cancer Paternal Aunt     Social History Reviewed with no changes to be made today.   Outpatient Medications Prior to Visit  Medication Sig Dispense Refill  . atorvastatin (LIPITOR) 40 MG tablet Take 1 tablet (40 mg total) by mouth daily. 90 tablet 1  . Blood Glucose Monitoring Suppl (ACCU-CHEK AVIVA PLUS) W/DEVICE KIT Check Blood sugar AM fasting and before dinner 1 kit 0  . cholecalciferol (VITAMIN D) 1000 units tablet Take 1,000 Units by mouth daily.    Marland Kitchen glucose blood (ACCU-CHEK AVIVA) test strip Use as instructed 100 each 12  . Lancet Devices (ACCU-CHEK SOFTCLIX) lancets Use as instructed 1 each 0  . lidocaine-prilocaine (EMLA) cream Apply 1 application topically as needed (for port access).    . Omega-3 Fatty Acids (FISH OIL OMEGA-3 PO) Take 1 capsule by mouth daily.     . TRUEPLUS LANCETS 28G MISC USE AS DIRECTED BY PHYSICIAN 100 each 0  . ferrous sulfate 325 (65 FE) MG tablet  TAKE 1 TABLET BY MOUTH EVERY DAY WITH BREAKFAST 60 tablet 2  . lisinopril (PRINIVIL,ZESTRIL) 2.5 MG tablet Take 1 tablet (2.5 mg total) by mouth daily. 30 tablet 5  . metFORMIN (GLUCOPHAGE XR) 500 MG 24 hr tablet Take 2 tablets (1,000 mg total) by mouth daily with breakfast. 60 tablet 11  . oxyCODONE-acetaminophen (PERCOCET/ROXICET) 5-325 MG tablet Take 1-2 tablets by mouth every 6 (six) hours as needed. (Patient not taking: Reported on 11/24/2017) 30 tablet 0   Facility-Administered Medications Prior to Visit  Medication Dose Route Frequency Provider Last Rate Last Dose  . 0.9 %  sodium chloride infusion  500 mL Intravenous Once Nandigam, Kavitha V, MD        No Known Allergies     Objective:    BP (!) 143/83 (BP Location: Right Arm, Patient Position: Sitting, Cuff Size: Normal)   Pulse 79   Temp 98 F (36.7 C) (Oral)   Ht 5' 1" (1.549 m)   Wt 157 lb (71.2 kg)   LMP 07/09/2017   SpO2 96%   BMI 29.66 kg/m  Wt Readings from Last 3  Encounters:  11/29/17 157 lb (71.2 kg)  11/24/17 156 lb 1.6 oz (70.8 kg)  11/22/17 158 lb 3.2 oz (71.8 kg)    Physical Exam  Constitutional: She is oriented to person, place, and time. She appears well-developed and well-nourished. She is cooperative.  HENT:  Head: Normocephalic and atraumatic.  Eyes: EOM are normal.  Neck: Normal range of motion.  Cardiovascular: Normal rate, regular rhythm and normal heart sounds. Exam reveals no gallop and no friction rub.  No murmur heard. Pulmonary/Chest: Effort normal and breath sounds normal. No tachypnea. No respiratory distress. She has no decreased breath sounds. She has no wheezes. She has no rhonchi. She has no rales. She exhibits no tenderness.  Abdominal: Bowel sounds are normal.  Musculoskeletal: Normal range of motion. She exhibits no edema.  Neurological: She is alert and oriented to person, place, and time. Coordination normal.  Skin: Skin is warm and dry.  Psychiatric: She has a normal mood and affect. Her behavior is normal. Judgment and thought content normal.  Nursing note and vitals reviewed.      Patient has been counseled extensively about nutrition and exercise as well as the importance of adherence with medications and regular follow-up. The patient was given clear instructions to go to ER or return to medical center if symptoms don't improve, worsen or new problems develop. The patient verbalized understanding.   Follow-up: Return in about 2 weeks (around 12/13/2017) for BP recheck   [name], FNP-BC [facility] [place], [place]   11/29/2017, 9:18 PM

## 2017-11-29 NOTE — Patient Instructions (Addendum)
Prediabetes Eating Plan Prediabetes-also called impaired glucose tolerance or impaired fasting glucose-is a condition that causes blood sugar (blood glucose) levels to be higher than normal. Following a healthy diet can help to keep prediabetes under control. It can also help to lower the risk of type 2 diabetes and heart disease, which are increased in people who have prediabetes. Along with regular exercise, a healthy diet:  Promotes weight loss.  Helps to control blood sugar levels.  Helps to improve the way that the body uses insulin.  What do I need to know about this eating plan?  Use the glycemic index (GI) to plan your meals. The index tells you how quickly a food will raise your blood sugar. Choose low-GI foods. These foods take a longer time to raise blood sugar.  Pay close attention to the amount of carbohydrates in the food that you eat. Carbohydrates increase blood sugar levels.  Keep track of how many calories you take in. Eating the right amount of calories will help you to achieve a healthy weight. Losing about 7 percent of your starting weight can help to prevent type 2 diabetes.  You may want to follow a Mediterranean diet. This diet includes a lot of vegetables, lean meats or fish, whole grains, fruits, and healthy oils and fats. What foods can I eat? Grains Whole grains, such as whole-wheat or whole-grain breads, crackers, cereals, and pasta. Unsweetened oatmeal. Bulgur. Barley. Quinoa. Brown rice. Corn or whole-wheat flour tortillas or taco shells. Vegetables Lettuce. Spinach. Peas. Beets. Cauliflower. Cabbage. Broccoli. Carrots. Tomatoes. Squash. Eggplant. Herbs. Peppers. Onions. Cucumbers. Brussels sprouts. Fruits Berries. Bananas. Apples. Oranges. Grapes. Papaya. Mango. Pomegranate. Kiwi. Grapefruit. Cherries. Meats and Other Protein Sources Seafood. Lean meats, such as chicken and [place] or lean cuts of pork and beef. Tofu. Eggs. Nuts. Beans. Dairy Low-fat or  fat-free dairy products, such as yogurt, cottage cheese, and cheese. Beverages Water. Tea. Coffee. Sugar-free or diet soda. Seltzer water. Milk. Milk alternatives, such as soy or almond milk. Condiments Mustard. Relish. Low-fat, low-sugar ketchup. Low-fat, low-sugar barbecue sauce. Low-fat or fat-free mayonnaise. Sweets and Desserts Sugar-free or low-fat pudding. Sugar-free or low-fat ice cream and other frozen treats. Fats and Oils Avocado. Walnuts. Olive oil. The items listed above may not be a complete list of recommended foods or beverages. Contact your dietitian for more options. What foods are not recommended? Grains Refined white flour and flour products, such as bread, pasta, snack foods, and cereals. Beverages Sweetened drinks, such as sweet iced tea and soda. Sweets and Desserts Baked goods, such as cake, cupcakes, pastries, cookies, and cheesecake. The items listed above may not be a complete list of foods and beverages to avoid. Contact your dietitian for more information. This information is not intended to replace advice given to you by your health care provider. Make sure you discuss any questions you have with your health care provider. Document Released: 01/15/2015 Document Revised: 02/06/2016 Document Reviewed: 09/26/2014 Elsevier Interactive Patient Education  2017 Elsevier Inc.  Preventing Type 2 Diabetes Mellitus Type 2 diabetes (type 2 diabetes mellitus) is a long-term (chronic) disease that affects blood sugar (glucose) levels. Normally, a hormone called insulin allows glucose to enter cells in the body. The cells use glucose for energy. In type 2 diabetes, one or both of these problems may be present:  The body does not make enough insulin.  The body does not respond properly to insulin that it makes (insulin resistance).  Insulin resistance or lack of insulin causes excess glucose  to build up in the blood instead of going into cells. As a result, high blood  glucose (hyperglycemia) develops, which can cause many complications. Being overweight or obese and having an inactive (sedentary) lifestyle can increase your risk for diabetes. Type 2 diabetes can be delayed or prevented by making certain nutrition and lifestyle changes. What nutrition changes can be made?  Eat healthy meals and snacks regularly. Keep a healthy snack with you for when you get hungry between meals, such as fruit or a handful of nuts.  Eat lean meats and proteins that are low in saturated fats, such as chicken, fish, egg whites, and beans. Avoid processed meats.  Eat plenty of fruits and vegetables and plenty of grains that have not been processed (whole grains). It is recommended that you eat: ? 1?2 cups of fruit every day. ? 2?3 cups of vegetables every day. ? 6?8 oz of whole grains every day, such as oats, whole wheat, bulgur, brown rice, quinoa, and millet.  Eat low-fat dairy products, such as milk, yogurt, and cheese.  Eat foods that contain healthy fats, such as nuts, avocado, olive oil, and canola oil.  Drink water throughout the day. Avoid drinks that contain added sugar, such as soda or sweet tea.  Follow instructions from your health care provider about specific eating or drinking restrictions.  Control how much food you eat at a time (portion size). ? Check food labels to find out the serving sizes of foods. ? Use a kitchen scale to weigh amounts of foods.  Saute or steam food instead of frying it. Cook with water or broth instead of oils or butter.  Limit your intake of: ? Salt (sodium). Have no more than 1 tsp (2,400 mg) of sodium a day. If you have heart disease or high blood pressure, have less than ? tsp (1,500 mg) of sodium a day. ? Saturated fat. This is fat that is solid at room temperature, such as butter or fat on meat. What lifestyle changes can be made?  Activity  Do moderate-intensity physical activity for at least 30 minutes on at least 5  days of the week, or as much as told by your health care provider.  Ask your health care provider what activities are safe for you. A mix of physical activities may be best, such as walking, swimming, cycling, and strength training.  Try to add physical activity into your day. For example: ? Park in spots that are farther away than usual, so that you walk more. For example, park in a far corner of the parking lot when you go to the office or the grocery store. ? Take a walk during your lunch break. ? Use stairs instead of elevators or escalators. Weight Loss  Lose weight as directed. Your health care provider can determine how much weight loss is best for you and can help you lose weight safely.  If you are overweight or obese, you may be instructed to lose at least 5?7 % of your body weight. Alcohol and Tobacco   Limit alcohol intake to no more than 1 drink a day for nonpregnant women and 2 drinks a day for men. One drink equals 12 oz of beer, 5 oz of wine, or 1 oz of hard liquor.  Do not use any tobacco products, such as cigarettes, chewing tobacco, and e-cigarettes. If you need help quitting, ask your health care provider. Work With [facility] Provider  Have your blood glucose tested regularly, as told  by your health care provider.  Discuss your risk factors and how you can reduce your risk for diabetes.  Get screening tests as told by your health care provider. You may have screening tests regularly, especially if you have certain risk factors for type 2 diabetes.  Make an appointment with a diet and nutrition specialist (registered dietitian). A registered dietitian can help you make a healthy eating plan and can help you understand portion sizes and food labels. Why are these changes important?  It is possible to prevent or delay type 2 diabetes and related health problems by making lifestyle and nutrition changes.  It can be difficult to recognize signs of type 2  diabetes. The best way to avoid possible damage to your body is to take actions to prevent the disease before you develop symptoms. What can happen if changes are not made?  Your blood glucose levels may keep increasing. Having high blood glucose for a long time is dangerous. Too much glucose in your blood can damage your blood vessels, heart, kidneys, nerves, and eyes.  You may develop prediabetes or type 2 diabetes. Type 2 diabetes can lead to many chronic health problems and complications, such as: ? Heart disease. ? Stroke. ? Blindness. ? Kidney disease. ? Depression. ? Poor circulation in the feet and legs, which could lead to surgical removal (amputation) in severe cases. Where to find support:  Ask your health care provider to recommend a registered dietitian, diabetes educator, or weight loss program.  Look for local or online weight loss groups.  Join a gym, fitness club, or outdoor activity group, such as a walking club. Where to find more information: To learn more about diabetes and diabetes prevention, visit:  American Diabetes Association (ADA): www.diabetes.[employer] of Diabetes and Digestive and Kidney Diseases: FindSpin.nl  To learn more about healthy eating, visit:  The U.S. Department of Agriculture Scientist, research (physical sciences)), Choose My Plate: http://wiley-williams.com/  Office of Disease Prevention and Health Promotion (ODPHP), Dietary Guidelines: SurferLive.at  Summary  You can reduce your risk for type 2 diabetes by increasing your physical activity, eating healthy foods, and losing weight as directed.  Talk with your health care provider about your risk for type 2 diabetes. Ask about any blood tests or screening tests that you need to have. This information is not intended to replace advice given to you by your health care provider. Make sure you discuss any questions you have with your health care  provider. Document Released: 12/23/2015 Document Revised: 02/06/2016 Document Reviewed: 10/22/2015 Elsevier Interactive Patient Education  2018 [employer].  Prediabetes Prediabetes is the condition of having a blood sugar (blood glucose) level that is higher than it should be, but not high enough for you to be diagnosed with type 2 diabetes. Having prediabetes puts you at risk for developing type 2 diabetes (type 2 diabetes mellitus). Prediabetes may be called impaired glucose tolerance or impaired fasting glucose. Prediabetes usually does not cause symptoms. Your health care provider can diagnose this condition with blood tests. You may be tested for prediabetes if you are overweight and if you have at least one other risk factor for prediabetes. Risk factors for prediabetes include:  Having a family member with type 2 diabetes.  Being overweight or obese.  Being older than age 1.  Being of American-Indian, African-American, Hispanic/Latino, or Asian/Pacific Islander descent.  Having an inactive (sedentary) lifestyle.  Having a history of gestational diabetes or polycystic ovarian syndrome (PCOS).  Having low levels of good  cholesterol (HDL-C) or high levels of blood fats (triglycerides).  Having high blood pressure.  What is blood glucose and how is blood glucose measured?  Blood glucose refers to the amount of glucose in your bloodstream. Glucose comes from eating foods that contain sugars and starches (carbohydrates) that the body breaks down into glucose. Your blood glucose level may be measured in mg/dL (milligrams per deciliter) or mmol/L (millimoles per liter).Your blood glucose may be checked with one or more of the following blood tests:  A fasting blood glucose (FBG) test. You will not be allowed to eat (you will fast) for at least 8 hours before a blood sample is taken. ? A normal range for FBG is 70-100 mg/dl (3.9-5.6 mmol/L).  An A1c (hemoglobin A1c) blood test.  This test provides information about blood glucose control over the previous 2?38months.  An oral glucose tolerance test (OGTT). This test measures your blood glucose twice: ? After fasting. This is your baseline level. ? Two hours after you drink a beverage that contains glucose.  You may be diagnosed with prediabetes:  If your FBG is 100?125 mg/dL (5.6-6.9 mmol/L).  If your A1c level is 5.7?6.4%.  If your OGGT result is 140?199 mg/dL (7.8-11 mmol/L).  These blood tests may be repeated to confirm your diagnosis. What happens if blood glucose is too high? The pancreas produces a hormone (insulin) that helps move glucose from the bloodstream into cells. When cells in the body do not respond properly to insulin that the body makes (insulin resistance), excess glucose builds up in the blood instead of going into cells. As a result, high blood glucose (hyperglycemia) can develop, which can cause many complications. This is a symptom of prediabetes. What can happen if blood glucose stays higher than normal for a long time? Having high blood glucose for a long time is dangerous. Too much glucose in your blood can damage your nerves and blood vessels. Long-term damage can lead to complications from diabetes, which may include:  Heart disease.  Stroke.  Blindness.  Kidney disease.  Depression.  Poor circulation in the feet and legs, which could lead to surgical removal (amputation) in severe cases.  How can prediabetes be prevented from turning into type 2 diabetes?  To help prevent type 2 diabetes, take the following actions:  Be physically active. ? Do moderate-intensity physical activity for at least 30 minutes on at least 5 days of the week, or as much as told by your health care provider. This could be brisk walking, biking, or water aerobics. ? Ask your health care provider what activities are safe for you. A mix of physical activities may be best, such as walking, swimming,  cycling, and strength training.  Lose weight as told by your health care provider. ? Losing 5-7% of your body weight can reverse insulin resistance. ? Your health care provider can determine how much weight loss is best for you and can help you lose weight safely.  Follow a healthy meal plan. This includes eating lean proteins, complex carbohydrates, fresh fruits and vegetables, low-fat dairy products, and healthy fats. ? Follow instructions from your health care provider about eating or drinking restrictions. ? Make an appointment to see a diet and nutrition specialist (registered dietitian) to help you create a healthy eating plan that is right for you.  Do not smoke or use any tobacco products, such as cigarettes, chewing tobacco, and e-cigarettes. If you need help quitting, ask your health care provider.  Take over-the-counter and  prescription medicines as told by your health care provider. You may be prescribed medicines that help lower the risk of type 2 diabetes.  This information is not intended to replace advice given to you by your health care provider. Make sure you discuss any questions you have with your health care provider. Document Released: 12/23/2015 Document Revised: 02/06/2016 Document Reviewed: 10/22/2015 Elsevier Interactive Patient Education  [employer].

## 2017-11-30 ENCOUNTER — Telehealth: Payer: Self-pay | Admitting: Hematology and Oncology

## 2017-11-30 ENCOUNTER — Inpatient Hospital Stay: Payer: Medicaid Other

## 2017-11-30 ENCOUNTER — Inpatient Hospital Stay (HOSPITAL_BASED_OUTPATIENT_CLINIC_OR_DEPARTMENT_OTHER): Payer: Medicaid Other | Admitting: Hematology and Oncology

## 2017-11-30 ENCOUNTER — Ambulatory Visit
Admission: RE | Admit: 2017-11-30 | Discharge: 2017-11-30 | Disposition: A | Discharge status: Home / Self Care | Payer: Medicaid Other | Source: Ambulatory Visit | Attending: Radiation Oncology | Admitting: Radiation Oncology

## 2017-11-30 DIAGNOSIS — C5701 Malignant neoplasm of right fallopian tube: Secondary | ICD-10-CM

## 2017-11-30 DIAGNOSIS — Z5112 Encounter for antineoplastic immunotherapy: Secondary | ICD-10-CM | POA: Diagnosis not present

## 2017-11-30 DIAGNOSIS — Z90710 Acquired absence of both cervix and uterus: Secondary | ICD-10-CM

## 2017-11-30 DIAGNOSIS — Z95828 Presence of other vascular implants and grafts: Secondary | ICD-10-CM

## 2017-11-30 DIAGNOSIS — Z171 Estrogen receptor negative status [ER-]: Secondary | ICD-10-CM

## 2017-11-30 DIAGNOSIS — C50512 Malignant neoplasm of lower-outer quadrant of left female breast: Secondary | ICD-10-CM

## 2017-11-30 DIAGNOSIS — Z510 Encounter for antineoplastic radiation therapy: Secondary | ICD-10-CM | POA: Diagnosis not present

## 2017-11-30 LAB — CBC WITH DIFFERENTIAL (CANCER CENTER ONLY)
Basophils Absolute: 0 10*3/uL (ref 0.0–0.1)
Basophils Relative: 0 %
Eosinophils Absolute: 0.1 10*3/uL (ref 0.0–0.5)
Eosinophils Relative: 1 %
HEMATOCRIT: 32.1 % — AB (ref 34.8–46.6)
HEMOGLOBIN: 10.2 g/dL — AB (ref 11.6–15.9)
LYMPHS ABS: 2.7 10*3/uL (ref 0.9–3.3)
LYMPHS PCT: 32 %
MCH: 29.7 pg (ref 25.1–34.0)
MCHC: 31.8 g/dL (ref 31.5–36.0)
MCV: 93.3 fL (ref 79.5–101.0)
MONOS PCT: 7 %
Monocytes Absolute: 0.6 10*3/uL (ref 0.1–0.9)
NEUTROS PCT: 60 %
Neutro Abs: 5.1 10*3/uL (ref 1.5–6.5)
Platelet Count: 244 10*3/uL (ref 145–400)
RBC: 3.44 MIL/uL — AB (ref 3.70–5.45)
RDW: 17.2 % — ABNORMAL HIGH (ref 11.2–14.5)
WBC Count: 8.5 10*3/uL (ref 3.9–10.3)

## 2017-11-30 LAB — CMP (CANCER CENTER ONLY)
ALK PHOS: 87 U/L (ref 40–150)
ALT: 21 U/L (ref 0–55)
AST: 15 U/L (ref 5–34)
Albumin: 3.5 g/dL (ref 3.5–5.0)
Anion gap: 10 (ref 3–11)
BILIRUBIN TOTAL: 0.4 mg/dL (ref 0.2–1.2)
BUN: 10 mg/dL (ref 7–26)
CALCIUM: 9.5 mg/dL (ref 8.4–10.4)
CO2: 23 mmol/L (ref 22–29)
CREATININE: 0.72 mg/dL (ref 0.60–1.10)
Chloride: 108 mmol/L (ref 98–109)
GFR, Est AFR Am: >60 mL/min (ref >60)
Glucose, Bld: 107 mg/dL (ref 70–140)
Potassium: 3.8 mmol/L (ref 3.5–5.1)
Sodium: 141 mmol/L (ref 136–145)
TOTAL PROTEIN: 7.5 g/dL (ref 6.4–8.3)

## 2017-11-30 MED ORDER — SODIUM CHLORIDE 0.9 % IV SOLN
Freq: Once | INTRAVENOUS | Status: AC
  Administered 2017-11-30: 10:00:00 via INTRAVENOUS

## 2017-11-30 MED ORDER — TRASTUZUMAB CHEMO 150 MG IV SOLR
450.0000 mg | Freq: Once | INTRAVENOUS | Status: AC
  Administered 2017-11-30: 450 mg via INTRAVENOUS
  Filled 2017-11-30: qty 21.43

## 2017-11-30 MED ORDER — SODIUM CHLORIDE 0.9% FLUSH
10.0000 mL | INTRAVENOUS | Status: DC | PRN
  Administered 2017-11-30: 10 mL
  Filled 2017-11-30: qty 10

## 2017-11-30 MED ORDER — DIPHENHYDRAMINE HCL 50 MG/ML IJ SOLN
INTRAMUSCULAR | Status: AC
  Filled 2017-11-30: qty 1

## 2017-11-30 MED ORDER — ACETAMINOPHEN 325 MG PO TABS
ORAL_TABLET | ORAL | Status: AC
  Filled 2017-11-30: qty 2

## 2017-11-30 MED ORDER — SODIUM CHLORIDE 0.9% FLUSH
10.0000 mL | INTRAVENOUS | Status: DC | PRN
  Administered 2017-11-30: 10 mL via INTRAVENOUS
  Filled 2017-11-30: qty 10

## 2017-11-30 MED ORDER — HEPARIN SOD (PORK) LOCK FLUSH 100 UNIT/ML IV SOLN
500.0000 [IU] | Freq: Once | INTRAVENOUS | Status: AC | PRN
  Administered 2017-11-30: 500 [IU]
  Filled 2017-11-30: qty 5

## 2017-11-30 MED ORDER — DIPHENHYDRAMINE HCL 50 MG/ML IJ SOLN
50.0000 mg | Freq: Once | INTRAMUSCULAR | Status: AC
  Administered 2017-11-30: 50 mg via INTRAVENOUS

## 2017-11-30 MED ORDER — ACETAMINOPHEN 325 MG PO TABS
650.0000 mg | ORAL_TABLET | Freq: Once | ORAL | Status: AC
  Administered 2017-11-30: 650 mg via ORAL

## 2017-11-30 NOTE — Assessment & Plan Note (Signed)
06/09/2017: Left lumpectomy: IDC grade 3, 1.2 cm, DCIS is present, margins negative, 0/6 lymph nodes negative, ER 0%, PR 0%, HER-2 positive ratio 2.28, Ki-67 20%, T1 BN 0 stage IA  Treatment plan: 1. Adjuvant chemotherapy with Taxol Herceptin weekly 12 followed by Herceptin every 3 weeks for total of 6 months 2. Followed by adjuvant radiation to start around 11/16/17  Echo on 08/17/17 demonstrates LVEF 55-60% ____________________________________________________________________  Current treatment: Herceptin I discussed the pros and cons of 1 year of Herceptin versus 6 months.  Based on further review of the clinical trial, we determined that 6 months may not be an adequate treatment for her.  Toxicities of Herceptin: None  Return to clinic every 3 weeks for Herceptin and every 6 weeks for follow-up with me  

## 2017-11-30 NOTE — Assessment & Plan Note (Addendum)
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 Denman George recommends Taxol and carboplatin.  We decided to administer 3 doses of Taxol and carboplatin instead of 6 cycles given the previous Taxol that she had already received.  Chemo counseling: I discussed with her extensively about the risks and benefits of chemotherapy including the risk of nausea, hair loss, neuropathy, liver and kidney function abnormalities, bone marrow suppression, anemia, neutropenia and risk of infection.  Patient understands these risks and is willing to proceed.  We plan to add Taxol and carboplatin to her next Herceptin treatment in 3 weeks.

## 2017-11-30 NOTE — Patient Instructions (Signed)
[facility] Discharge Instructions for Patients Receiving Chemotherapy  Today you received the following chemotherapy agents Herceptin  To help prevent nausea and vomiting after your treatment, we encourage you to take your nausea medication as directed   If you develop nausea and vomiting that is not controlled by your nausea medication, call the clinic.   BELOW ARE SYMPTOMS THAT SHOULD BE REPORTED IMMEDIATELY:  *FEVER GREATER THAN 100.5 F  *CHILLS WITH OR WITHOUT FEVER  NAUSEA AND VOMITING THAT IS NOT CONTROLLED WITH YOUR NAUSEA MEDICATION  *UNUSUAL SHORTNESS OF BREATH  *UNUSUAL BRUISING OR BLEEDING  TENDERNESS IN MOUTH AND THROAT WITH OR WITHOUT PRESENCE OF ULCERS  *URINARY PROBLEMS  *BOWEL PROBLEMS  UNUSUAL RASH Items with * indicate a potential emergency and should be followed up as soon as possible.  Feel free to call the clinic should you have any questions or concerns. The clinic phone number is [phone].  Please show the CHEMO ALERT CARD at check-in to the Emergency Department and triage nurse.   

## 2017-11-30 NOTE — Telephone Encounter (Signed)
Gave patient AVS and calendar of upcoming April and may appointments.  °

## 2017-11-30 NOTE — Progress Notes (Signed)
Patient Care Team: [name], NP as PCP - General (Nurse Practitioner) Excell Seltzer, MD as Consulting Physician (General Surgery) [name], MD as Consulting Physician (Hematology and Oncology) Kyung Rudd, MD as Consulting Physician (Radiation Oncology)  DIAGNOSIS:  Encounter Diagnoses  Name Primary?  . Malignant neoplasm of lower-outer quadrant of left breast of female, estrogen receptor negative ([place])   . Fallopian tube cancer, carcinoma, right ([place])     SUMMARY OF ONCOLOGIC HISTORY:   Malignant neoplasm of lower-outer quadrant of left breast of female, estrogen receptor negative ([place])   05/11/2017 Initial Diagnosis    Left breast biopsy 3:30 position 6 cm from nipple: IDC with DCIS, lymphovascular invasion present, grade 2-3, ER 0%, PR 0%, HER-2 positive ratio 2.28, Ki-67 20%, 1 cm lesion left breast, T1b N0 stage IA clinical stage      06/09/2017 Surgery    Left lumpectomy: IDC grade 3, 1.2 cm, DCIS is present, margins negative, 0/6 lymph nodes negative, ER 0%, PR 0%, HER-2 positive ratio 2.28, Ki-67 20%, T1 BN 0 stage IA      07/09/2017 -  Adjuvant Chemotherapy    Taxol/Herceptin completed on 09/29/2017, went on to receive every 3 week adjuvant Herceptin on 10/20/2017       Fallopian tube cancer, carcinoma, right ([place])   11/04/2017 Surgery    Uterus, cervix and bilateral fallopian tubes: 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  She has been tolerating Herceptin extremely well.  In February she had surgery for fallopian tube carcinoma.  She was recommended to receive chemotherapy with  Taxol and carboplatin.  She is here today to discuss the treatment plan.  REVIEW OF SYSTEMS:   Constitutional: Denies fevers, chills or abnormal weight loss Eyes: Denies blurriness of vision Ears, nose, mouth, throat, and face: Denies mucositis or sore throat Respiratory: Denies cough, dyspnea or wheezes Cardiovascular: Denies palpitation, chest discomfort Gastrointestinal:  Denies nausea, heartburn or change in bowel habits Skin: Denies abnormal skin rashes Lymphatics: Denies new lymphadenopathy or easy bruising Neurological:Denies numbness, tingling or new weaknesses Behavioral/Psych: Mood is stable, no new changes  Extremities: No lower extremity edema All other systems were reviewed with the patient and are negative.  I have reviewed the past medical history, past surgical history, social history and family history with the patient and they are unchanged from previous note.  ALLERGIES:  has No Known Allergies.  MEDICATIONS:  Current Outpatient Medications  Medication Sig Dispense Refill  . atorvastatin (LIPITOR) 40 MG tablet Take 1 tablet (40 mg total) by mouth daily. 90 tablet 1  . Blood Glucose Monitoring Suppl (ACCU-CHEK AVIVA PLUS) W/DEVICE KIT Check Blood sugar AM fasting and before dinner 1 kit 0  . cholecalciferol (VITAMIN D) 1000 units tablet Take 1,000 Units by mouth daily.    . ferrous sulfate 325 (65 FE) MG tablet Take 1 tablet (325 mg total) by mouth daily before breakfast. 90 tablet 1  . glucose blood (ACCU-CHEK AVIVA) test strip Use as instructed 100 each 12  . Lancet Devices (ACCU-CHEK SOFTCLIX) lancets Use as instructed 1 each 0  . lidocaine-prilocaine (EMLA) cream Apply 1 application topically as needed (for port access).    Marland Kitchen  lisinopril (PRINIVIL,ZESTRIL) 10 MG tablet Take 1 tablet (10 mg total) by mouth daily. 30 tablet 1  . metFORMIN (GLUMETZA) 1000 MG (MOD) 24 hr tablet Take 1 tablet (1,000 mg total) by mouth daily with breakfast. 90 tablet 0  . Omega-3 Fatty  Acids (FISH OIL OMEGA-3 PO) Take 1 capsule by mouth daily.     Marland Kitchen oxyCODONE-acetaminophen (PERCOCET/ROXICET) 5-325 MG tablet Take 1-2 tablets by mouth every 6 (six) hours as needed. (Patient not taking: Reported on 11/24/2017) 30 tablet 0  . TRUEPLUS LANCETS 28G MISC USE AS DIRECTED BY PHYSICIAN 100 each 0   Current Facility-Administered Medications  Medication Dose Route Frequency Provider Last Rate Last Dose  . 0.9 %  sodium chloride infusion  500 mL Intravenous Once Nandigam, Venia Minks, MD        PHYSICAL EXAMINATION: ECOG PERFORMANCE STATUS: 1 - Symptomatic but completely ambulatory  Vitals:   11/30/17 0849  BP: 135/65  Pulse: 84  Resp: 18  Temp: 98 F (36.7 C)  SpO2: 99%   Filed Weights   11/30/17 0849  Weight: 156 lb 3.2 oz (70.9 kg)    GENERAL:alert, no distress and comfortable SKIN: skin color, texture, turgor are normal, no rashes or significant lesions EYES: normal, Conjunctiva are pink and non-injected, sclera clear OROPHARYNX:no exudate, no erythema and lips, buccal mucosa, and tongue normal  NECK: supple, thyroid normal size, non-tender, without nodularity LYMPH:  no palpable lymphadenopathy in the cervical, axillary or inguinal LUNGS: clear to auscultation and percussion with normal breathing effort HEART: regular rate & rhythm and no murmurs and no lower extremity edema ABDOMEN:abdomen soft, non-tender and normal bowel sounds MUSCULOSKELETAL:no cyanosis of digits and no clubbing  NEURO: alert & oriented x 3 with fluent speech, no focal motor/sensory deficits EXTREMITIES: No lower extremity edema   LABORATORY DATA:  I have reviewed the data as listed CMP Latest Ref Rng & Units 11/10/2017 11/03/2017 10/20/2017  Glucose 70 - 140 mg/dL 113 138(H) 126  BUN 7 - 26 mg/dL _0 Creatinine 0.60 - 1.10 mg/dL 0.73 0.68 0.72  Sodium 136 - 145 mmol/L 141 136 139  Potassium 3.5 - 5.1 mmol/L 3.9 3.5 4.1  Chloride 98 - 109 mmol/L 104 106 103  CO2 22 - 29 mmol/L _1 Calcium 8.4 - 10.4 mg/dL 9.7 8.9 9.5  Total Protein 6.4 - 8.3 g/dL 7.1 - 7.3  Total Bilirubin 0.2 - 1.2 mg/dL 0.4 - 0.3  Alkaline Phos 40 - 150 U/L 78 - 83  AST 5 - 34 U/L 11 - 14  ALT 0 - 55 U/L 14 - 17    Lab Results  Component Value Date   WBC 8.5 11/30/2017   HGB 8.2 (L) 11/05/2017   HCT 32.1 (L) 11/30/2017   MCV 93.3 11/30/2017   PLT 244 11/30/2017   NEUTROABS 5.1 11/30/2017    ASSESSMENT & PLAN:  Malignant neoplasm of lower-outer quadrant of left breast of female, estrogen receptor negative ([place]) 06/09/2017: Left lumpectomy: IDC grade 3, 1.2 cm, DCIS is present, margins negative, 0/6 lymph nodes negative, ER 0%, PR 0%, HER-2 positive ratio 2.28, Ki-67 20%, T1 BN 0 stage IA  Treatment plan: 1. Adjuvant chemotherapy with Taxol Herceptin weekly 12 followed by Herceptin every 3 weeks for total of 6 months 2. Followed by adjuvant radiation to start around 11/16/17  Echo on 08/17/17 demonstrates LVEF 55-60% ____________________________________________________________________  Current treatment: Herceptin I discussed the pros and cons of 1 year of Herceptin versus 6  months.  Based on further review of the clinical trial, we determined that 6 months may not be an adequate treatment for her.  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 Denman George recommends Taxol and carboplatin.  We decided to administer 3 doses of Taxol and carboplatin instead of 6 cycles given the previous Taxol that she had already received.  Chemo counseling: I discussed with her extensively about the risks and benefits of chemotherapy including the risk of nausea, hair loss, neuropathy, liver and  kidney function abnormalities, bone marrow suppression, anemia, neutropenia and risk of infection.  Patient understands these risks and is willing to proceed.  We plan to add Taxol and carboplatin to her next Herceptin treatment in 3 weeks.    I spent 25 minutes talking to the patient of which more than half was spent in counseling and coordination of care.  No orders of the defined types were placed in this encounter.  The patient has a good understanding of the overall plan. she agrees with it. she will call with any problems that may develop before the next visit here.   Harriette Ohara, MD 11/30/17

## 2017-12-01 ENCOUNTER — Ambulatory Visit
Admission: RE | Admit: 2017-12-01 | Discharge: 2017-12-01 | Disposition: A | Discharge status: Home / Self Care | Payer: Medicaid Other | Source: Ambulatory Visit | Attending: Radiation Oncology | Admitting: Radiation Oncology

## 2017-12-01 ENCOUNTER — Other Ambulatory Visit: Payer: Self-pay | Admitting: Pharmacist

## 2017-12-01 DIAGNOSIS — Z510 Encounter for antineoplastic radiation therapy: Secondary | ICD-10-CM | POA: Diagnosis not present

## 2017-12-01 MED ORDER — METFORMIN HCL ER 500 MG PO TB24: 1000.0000 mg | ORAL_TABLET | Freq: Every day | ORAL | 0 refills | Status: DC

## 2017-12-02 ENCOUNTER — Ambulatory Visit
Admission: RE | Admit: 2017-12-02 | Discharge: 2017-12-02 | Disposition: A | Discharge status: Home / Self Care | Payer: Medicaid Other | Source: Ambulatory Visit | Attending: Radiation Oncology | Admitting: Radiation Oncology

## 2017-12-02 ENCOUNTER — Encounter: Payer: Self-pay | Admitting: Gynecologic Oncology

## 2017-12-02 DIAGNOSIS — Z510 Encounter for antineoplastic radiation therapy: Secondary | ICD-10-CM | POA: Diagnosis not present

## 2017-12-02 NOTE — Progress Notes (Signed)
BRCA testing ordered with Suanne Marker at [facility] per Dr. Denman George on specimen [phone].

## 2017-12-03 ENCOUNTER — Other Ambulatory Visit (HOSPITAL_COMMUNITY)
Admission: RE | Admit: 2017-12-03 | Discharge: 2017-12-03 | Disposition: A | Discharge status: Home / Self Care | Payer: Medicaid Other | Source: Ambulatory Visit | Attending: Gynecologic Oncology | Admitting: Gynecologic Oncology

## 2017-12-03 ENCOUNTER — Ambulatory Visit
Admission: RE | Admit: 2017-12-03 | Discharge: 2017-12-03 | Disposition: A | Discharge status: Home / Self Care | Payer: Medicaid Other | Source: Ambulatory Visit | Attending: Radiation Oncology | Admitting: Radiation Oncology

## 2017-12-03 DIAGNOSIS — Z510 Encounter for antineoplastic radiation therapy: Secondary | ICD-10-CM | POA: Diagnosis not present

## 2017-12-03 DIAGNOSIS — C5700 Malignant neoplasm of unspecified fallopian tube: Secondary | ICD-10-CM | POA: Diagnosis not present

## 2017-12-03 DIAGNOSIS — Z853 Personal history of malignant neoplasm of breast: Secondary | ICD-10-CM | POA: Insufficient documentation

## 2017-12-06 ENCOUNTER — Ambulatory Visit
Admission: RE | Admit: 2017-12-06 | Discharge: 2017-12-06 | Disposition: A | Discharge status: Home / Self Care | Payer: Medicaid Other | Source: Ambulatory Visit | Attending: Radiation Oncology | Admitting: Radiation Oncology

## 2017-12-06 DIAGNOSIS — Z510 Encounter for antineoplastic radiation therapy: Secondary | ICD-10-CM | POA: Diagnosis not present

## 2017-12-07 ENCOUNTER — Ambulatory Visit
Admission: RE | Admit: 2017-12-07 | Discharge: 2017-12-07 | Disposition: A | Discharge status: Home / Self Care | Payer: Medicaid Other | Source: Ambulatory Visit | Attending: Radiation Oncology | Admitting: Radiation Oncology

## 2017-12-07 DIAGNOSIS — Z510 Encounter for antineoplastic radiation therapy: Secondary | ICD-10-CM | POA: Diagnosis not present

## 2017-12-08 ENCOUNTER — Ambulatory Visit
Admission: RE | Admit: 2017-12-08 | Discharge: 2017-12-08 | Disposition: A | Discharge status: Home / Self Care | Payer: Medicaid Other | Source: Ambulatory Visit | Attending: Radiation Oncology | Admitting: Radiation Oncology

## 2017-12-08 DIAGNOSIS — Z510 Encounter for antineoplastic radiation therapy: Secondary | ICD-10-CM | POA: Diagnosis not present

## 2017-12-09 ENCOUNTER — Ambulatory Visit
Admission: RE | Admit: 2017-12-09 | Discharge: 2017-12-09 | Disposition: A | Discharge status: Home / Self Care | Payer: Medicaid Other | Source: Ambulatory Visit | Attending: Radiation Oncology | Admitting: Radiation Oncology

## 2017-12-09 DIAGNOSIS — Z510 Encounter for antineoplastic radiation therapy: Secondary | ICD-10-CM | POA: Diagnosis not present

## 2017-12-10 ENCOUNTER — Ambulatory Visit
Admission: RE | Admit: 2017-12-10 | Discharge: 2017-12-10 | Disposition: A | Discharge status: Home / Self Care | Payer: Medicaid Other | Source: Ambulatory Visit | Attending: Radiation Oncology | Admitting: Radiation Oncology

## 2017-12-10 DIAGNOSIS — Z510 Encounter for antineoplastic radiation therapy: Secondary | ICD-10-CM | POA: Diagnosis not present

## 2017-12-13 ENCOUNTER — Encounter (HOSPITAL_COMMUNITY): Payer: Self-pay | Admitting: Family Medicine

## 2017-12-13 ENCOUNTER — Ambulatory Visit
Admission: RE | Admit: 2017-12-13 | Discharge: 2017-12-13 | Disposition: A | Discharge status: Home / Self Care | Payer: Medicaid Other | Source: Ambulatory Visit | Attending: Radiation Oncology | Admitting: Radiation Oncology

## 2017-12-13 DIAGNOSIS — Z510 Encounter for antineoplastic radiation therapy: Secondary | ICD-10-CM | POA: Insufficient documentation

## 2017-12-13 DIAGNOSIS — C50512 Malignant neoplasm of lower-outer quadrant of left female breast: Secondary | ICD-10-CM | POA: Insufficient documentation

## 2017-12-14 ENCOUNTER — Ambulatory Visit
Admission: RE | Admit: 2017-12-14 | Discharge: 2017-12-14 | Disposition: A | Discharge status: Home / Self Care | Payer: Medicaid Other | Source: Ambulatory Visit | Attending: Radiation Oncology | Admitting: Radiation Oncology

## 2017-12-14 ENCOUNTER — Ambulatory Visit: Payer: Medicare Other | Attending: Nurse Practitioner | Admitting: *Deleted

## 2017-12-14 VITALS — BP 131/81 | HR 90 | Resp 16

## 2017-12-14 DIAGNOSIS — Z7689 Persons encountering health services in other specified circumstances: Secondary | ICD-10-CM | POA: Insufficient documentation

## 2017-12-14 DIAGNOSIS — I10 Essential (primary) hypertension: Secondary | ICD-10-CM | POA: Diagnosis not present

## 2017-12-14 NOTE — Progress Notes (Signed)
Erin [name] arrived to [facility] for nurse visit to recheck blood pressure. Her mother and interpreter have accompanied her today. Verified medication with patient. Pt states medication was taken this morning. She also states her blood pressures have been within normal limits when she goes for treatments.  Blood pressure reading today is: 131/81

## 2017-12-15 ENCOUNTER — Ambulatory Visit
Admission: RE | Admit: 2017-12-15 | Discharge: 2017-12-15 | Disposition: A | Discharge status: Home / Self Care | Payer: Medicaid Other | Source: Ambulatory Visit | Attending: Radiation Oncology | Admitting: Radiation Oncology

## 2017-12-16 ENCOUNTER — Ambulatory Visit
Admission: RE | Admit: 2017-12-16 | Discharge: 2017-12-16 | Disposition: A | Discharge status: Home / Self Care | Payer: Medicaid Other | Source: Ambulatory Visit | Attending: Radiation Oncology | Admitting: Radiation Oncology

## 2017-12-17 ENCOUNTER — Ambulatory Visit
Admission: RE | Admit: 2017-12-17 | Discharge: 2017-12-17 | Disposition: A | Discharge status: Home / Self Care | Payer: Medicaid Other | Source: Ambulatory Visit | Attending: Radiation Oncology | Admitting: Radiation Oncology

## 2017-12-20 ENCOUNTER — Other Ambulatory Visit: Payer: Self-pay

## 2017-12-20 ENCOUNTER — Ambulatory Visit
Admission: RE | Admit: 2017-12-20 | Discharge: 2017-12-20 | Disposition: A | Discharge status: Home / Self Care | Payer: Medicaid Other | Source: Ambulatory Visit | Attending: Radiation Oncology | Admitting: Radiation Oncology

## 2017-12-20 DIAGNOSIS — C5701 Malignant neoplasm of right fallopian tube: Secondary | ICD-10-CM

## 2017-12-20 NOTE — Assessment & Plan Note (Addendum)
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 Denman George recommends Taxol and carboplatin.  We decided to administer 3 doses of Taxol and carboplatin  Current Treatment: Taxol and carboplatin added to Herceptin treatment every 3 weeks.

## 2017-12-20 NOTE — Assessment & Plan Note (Signed)
06/09/2017: Left lumpectomy: IDC grade 3, 1.2 cm, DCIS is present, margins negative, 0/6 lymph nodes negative, ER 0%, PR 0%, HER-2 positive ratio 2.28, Ki-67 20%, T1 BN 0 stage IA  Treatment plan: 1. Adjuvant chemotherapy with Taxol Herceptin weekly 12 followed by Herceptin every 3 weeks for total of 6 months 2. Followed by adjuvant radiation to start around 11/16/17  Echo on 08/17/17 demonstrates LVEF 55-60% ____________________________________________________________________  Current treatment: Herceptin I discussed the pros and cons of 1 year of Herceptin versus 6 months.  Based on further review of the clinical trial, we determined that 6 months may not be an adequate treatment for her.  Toxicities of Herceptin: None  Return to clinic every 3 weeks for Herceptin and every 6 weeks for follow-up with me

## 2017-12-21 ENCOUNTER — Inpatient Hospital Stay: Payer: Medicaid Other

## 2017-12-21 ENCOUNTER — Ambulatory Visit
Admission: RE | Admit: 2017-12-21 | Discharge: 2017-12-21 | Disposition: A | Discharge status: Home / Self Care | Payer: Medicaid Other | Source: Ambulatory Visit | Attending: Radiation Oncology | Admitting: Radiation Oncology

## 2017-12-21 ENCOUNTER — Inpatient Hospital Stay: Payer: Medicaid Other | Attending: Hematology and Oncology

## 2017-12-21 ENCOUNTER — Inpatient Hospital Stay (HOSPITAL_BASED_OUTPATIENT_CLINIC_OR_DEPARTMENT_OTHER): Payer: Medicaid Other | Admitting: Hematology and Oncology

## 2017-12-21 VITALS — BP 123/82 | HR 87 | Temp 98.0°F | Resp 18 | Ht 61.0 in | Wt 156.3 lb

## 2017-12-21 DIAGNOSIS — Z800 Family history of malignant neoplasm of digestive organs: Secondary | ICD-10-CM | POA: Insufficient documentation

## 2017-12-21 DIAGNOSIS — Z79899 Other long term (current) drug therapy: Secondary | ICD-10-CM | POA: Insufficient documentation

## 2017-12-21 DIAGNOSIS — C5701 Malignant neoplasm of right fallopian tube: Secondary | ICD-10-CM

## 2017-12-21 DIAGNOSIS — Z171 Estrogen receptor negative status [ER-]: Secondary | ICD-10-CM | POA: Diagnosis not present

## 2017-12-21 DIAGNOSIS — Z803 Family history of malignant neoplasm of breast: Secondary | ICD-10-CM

## 2017-12-21 DIAGNOSIS — C50512 Malignant neoplasm of lower-outer quadrant of left female breast: Secondary | ICD-10-CM | POA: Insufficient documentation

## 2017-12-21 DIAGNOSIS — Z9221 Personal history of antineoplastic chemotherapy: Secondary | ICD-10-CM

## 2017-12-21 DIAGNOSIS — Z7689 Persons encountering health services in other specified circumstances: Secondary | ICD-10-CM | POA: Insufficient documentation

## 2017-12-21 DIAGNOSIS — Z7984 Long term (current) use of oral hypoglycemic drugs: Secondary | ICD-10-CM | POA: Diagnosis not present

## 2017-12-21 DIAGNOSIS — Z5112 Encounter for antineoplastic immunotherapy: Secondary | ICD-10-CM | POA: Diagnosis not present

## 2017-12-21 DIAGNOSIS — Z95828 Presence of other vascular implants and grafts: Secondary | ICD-10-CM

## 2017-12-21 LAB — CBC WITH DIFFERENTIAL (CANCER CENTER ONLY)
Basophils Absolute: 0 10*3/uL (ref 0.0–0.1)
Basophils Relative: 0 %
EOS ABS: 0.1 10*3/uL (ref 0.0–0.5)
Eosinophils Relative: 1 %
HCT: 35.5 % (ref 34.8–46.6)
HEMOGLOBIN: 11.4 g/dL — AB (ref 11.6–15.9)
LYMPHS ABS: 2.3 10*3/uL (ref 0.9–3.3)
Lymphocytes Relative: 28 %
MCH: 30.4 pg (ref 25.1–34.0)
MCHC: 32.1 g/dL (ref 31.5–36.0)
MCV: 94.7 fL (ref 79.5–101.0)
MONOS PCT: 8 %
Monocytes Absolute: 0.6 10*3/uL (ref 0.1–0.9)
NEUTROS PCT: 63 %
Neutro Abs: 5.2 10*3/uL (ref 1.5–6.5)
Platelet Count: 243 10*3/uL (ref 145–400)
RBC: 3.75 MIL/uL (ref 3.70–5.45)
RDW: 15.3 % — ABNORMAL HIGH (ref 11.2–14.5)
WBC Count: 8.3 10*3/uL (ref 3.9–10.3)

## 2017-12-21 LAB — CMP (CANCER CENTER ONLY)
ALK PHOS: 93 U/L (ref 40–150)
ALT: 27 U/L (ref 0–55)
ANION GAP: 9 (ref 3–11)
AST: 18 U/L (ref 5–34)
Albumin: 3.6 g/dL (ref 3.5–5.0)
BUN: 8 mg/dL (ref 7–26)
CALCIUM: 9.4 mg/dL (ref 8.4–10.4)
CO2: 26 mmol/L (ref 22–29)
Chloride: 106 mmol/L (ref 98–109)
Creatinine: 0.73 mg/dL (ref 0.60–1.10)
Glucose, Bld: 120 mg/dL (ref 70–140)
Potassium: 4.1 mmol/L (ref 3.5–5.1)
SODIUM: 141 mmol/L (ref 136–145)
Total Protein: 7.6 g/dL (ref 6.4–8.3)

## 2017-12-21 MED ORDER — HEPARIN SOD (PORK) LOCK FLUSH 100 UNIT/ML IV SOLN
500.0000 [IU] | Freq: Once | INTRAVENOUS | Status: AC | PRN
  Administered 2017-12-21: 500 [IU]
  Filled 2017-12-21: qty 5

## 2017-12-21 MED ORDER — LIDOCAINE-PRILOCAINE 2.5-2.5 % EX CREA: TOPICAL_CREAM | CUTANEOUS | 3 refills | Status: DC

## 2017-12-21 MED ORDER — ACETAMINOPHEN 325 MG PO TABS
ORAL_TABLET | ORAL | Status: AC
  Filled 2017-12-21: qty 2

## 2017-12-21 MED ORDER — SODIUM CHLORIDE 0.9% FLUSH
10.0000 mL | INTRAVENOUS | Status: DC | PRN
  Administered 2017-12-21: 10 mL
  Filled 2017-12-21: qty 10

## 2017-12-21 MED ORDER — ONDANSETRON HCL 8 MG PO TABS: 8.0000 mg | ORAL_TABLET | Freq: Two times a day (BID) | ORAL | 1 refills | Status: DC | PRN

## 2017-12-21 MED ORDER — ACETAMINOPHEN 325 MG PO TABS
650.0000 mg | ORAL_TABLET | Freq: Once | ORAL | Status: AC
  Administered 2017-12-21: 650 mg via ORAL

## 2017-12-21 MED ORDER — DIPHENHYDRAMINE HCL 25 MG PO CAPS
ORAL_CAPSULE | ORAL | Status: AC
  Filled 2017-12-21: qty 2

## 2017-12-21 MED ORDER — SODIUM CHLORIDE 0.9 % IV SOLN
450.0000 mg | Freq: Once | INTRAVENOUS | Status: AC
  Administered 2017-12-21: 450 mg via INTRAVENOUS
  Filled 2017-12-21: qty 21.43

## 2017-12-21 MED ORDER — SODIUM CHLORIDE 0.9 % IV SOLN
Freq: Once | INTRAVENOUS | Status: AC
  Administered 2017-12-21: 11:00:00 via INTRAVENOUS

## 2017-12-21 MED ORDER — PROCHLORPERAZINE MALEATE 10 MG PO TABS: 10.0000 mg | ORAL_TABLET | Freq: Four times a day (QID) | ORAL | 1 refills | Status: DC | PRN

## 2017-12-21 MED ORDER — DEXAMETHASONE 4 MG PO TABS: 4.0000 mg | ORAL_TABLET | Freq: Every day | ORAL | 0 refills | Status: DC

## 2017-12-21 MED ORDER — DIPHENHYDRAMINE HCL 50 MG/ML IJ SOLN
50.0000 mg | Freq: Once | INTRAMUSCULAR | Status: AC
Start: 2017-12-21 — End: 2017-12-21
  Administered 2017-12-21: 50 mg via INTRAVENOUS

## 2017-12-21 MED ORDER — DIPHENHYDRAMINE HCL 50 MG/ML IJ SOLN
INTRAMUSCULAR | Status: AC
Start: 2017-12-21 — End: 2017-12-21
  Filled 2017-12-21: qty 1

## 2017-12-21 NOTE — Progress Notes (Signed)
START ON PATHWAY REGIMEN - Ovarian     A cycle is every 21 days:     Paclitaxel      Carboplatin   **Always confirm dose/schedule in your pharmacy ordering system**    Patient Characteristics: Newly Diagnosed, Adjuvant Therapy, Stage IC, Grade 1 or 2 Therapeutic Status: Newly Diagnosed AJCC T Category: T1 AJCC N Category: N0 AJCC M Category: M0 AJCC 8 Stage Grouping: I BRCA Mutation Status: Absent Tumor Grade: 2 Intent of Therapy: Curative Intent, Discussed with Patient

## 2017-12-21 NOTE — Patient Instructions (Addendum)
[place] Discharge Instructions for Patients Receiving Chemotherapy  Today you received the following chemotherapy agents Herceptin  To help prevent nausea and vomiting after your treatment, we encourage you to take your nausea medication as directed   If you develop nausea and vomiting that is not controlled by your nausea medication, call the clinic.   BELOW ARE SYMPTOMS THAT SHOULD BE REPORTED IMMEDIATELY:  *FEVER GREATER THAN 100.5 F  *CHILLS WITH OR WITHOUT FEVER  NAUSEA AND VOMITING THAT IS NOT CONTROLLED WITH YOUR NAUSEA MEDICATION  *UNUSUAL SHORTNESS OF BREATH  *UNUSUAL BRUISING OR BLEEDING  TENDERNESS IN MOUTH AND THROAT WITH OR WITHOUT PRESENCE OF ULCERS  *URINARY PROBLEMS  *BOWEL PROBLEMS  UNUSUAL RASH Items with * indicate a potential emergency and should be followed up as soon as possible.  Feel free to call the clinic should you have any questions or concerns. The clinic phone number is [phone].  Please show the [place] at check-in to the Emergency Department and triage nurse.  [place] Discharge Instructions for Patients Receiving Chemotherapy  Today you received the following chemotherapy agents Herceptin  To help prevent nausea and vomiting after your treatment, we encourage you to take your nausea medication as directed   If you develop nausea and vomiting that is not controlled by your nausea medication, call the clinic.   BELOW ARE SYMPTOMS THAT SHOULD BE REPORTED IMMEDIATELY:  *FEVER GREATER THAN 100.5 F  *CHILLS WITH OR WITHOUT FEVER  NAUSEA AND VOMITING THAT IS NOT CONTROLLED WITH YOUR NAUSEA MEDICATION  *UNUSUAL SHORTNESS OF BREATH  *UNUSUAL BRUISING OR BLEEDING  TENDERNESS IN MOUTH AND THROAT WITH OR WITHOUT PRESENCE OF ULCERS  *URINARY PROBLEMS  *BOWEL PROBLEMS  UNUSUAL RASH Items with * indicate a potential emergency and should be followed up as soon as possible.  Feel free to  call the clinic should you have any questions or concerns. The clinic phone number is [phone].  Please show the [place] at check-in to the Emergency Department and triage nurse.

## 2017-12-21 NOTE — Progress Notes (Signed)
Patient Care Team: [name], NP as PCP - General (Nurse Practitioner) Excell Seltzer, MD as Consulting Physician (General Surgery) [name], MD as Consulting Physician (Hematology and Oncology) Kyung Rudd, MD as Consulting Physician (Radiation Oncology)  DIAGNOSIS:  Encounter Diagnoses  Name Primary?  . Malignant neoplasm of lower-outer quadrant of left breast of female, estrogen receptor negative ([place])   . Fallopian tube cancer, carcinoma, right ([place])     SUMMARY OF ONCOLOGIC HISTORY:   Malignant neoplasm of lower-outer quadrant of left breast of female, estrogen receptor negative ([place])   05/11/2017 Initial Diagnosis    Left breast biopsy 3:30 position 6 cm from nipple: IDC with DCIS, lymphovascular invasion present, grade 2-3, ER 0%, PR 0%, HER-2 positive ratio 2.28, Ki-67 20%, 1 cm lesion left breast, T1b N0 stage IA clinical stage      06/09/2017 Surgery    Left lumpectomy: IDC grade 3, 1.2 cm, DCIS is present, margins negative, 0/6 lymph nodes negative, ER 0%, PR 0%, HER-2 positive ratio 2.28, Ki-67 20%, T1 BN 0 stage IA      07/09/2017 -  Adjuvant Chemotherapy    Taxol/Herceptin completed on 09/29/2017, went on to receive every 3 week adjuvant Herceptin on 10/20/2017       Fallopian tube cancer, carcinoma, right ([place])   11/04/2017 Surgery    Uterus, cervix and bilateral fallopian tubes: 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  She is currently on Herceptin maintenance and she will be starting her first cycle of Taxol and carboplatin on 01/11/2018.  The plan is to give her 3 cycles of Taxol and carboplatin.  She had previously received Taxol and had tolerated it fairly well.  We are monitoring her  closely for neuropathy.  She has been tolerating Herceptin extremely well.  REVIEW OF SYSTEMS:   Constitutional: Denies fevers, chills or abnormal weight loss Eyes: Denies blurriness of vision Ears, nose, mouth, throat, and face: Denies mucositis or sore throat Respiratory: Denies cough, dyspnea or wheezes Cardiovascular: Denies palpitation, chest discomfort Gastrointestinal:  Denies nausea, heartburn or change in bowel habits Skin: Denies abnormal skin rashes Lymphatics: Denies new lymphadenopathy or easy bruising Neurological:Denies numbness, tingling or new weaknesses Behavioral/Psych: Mood is stable, no new changes  Extremities: No lower extremity edema  All other systems were reviewed with the patient and are negative.  I have reviewed the past medical history, past surgical history, social history and family history with the patient and they are unchanged from previous note.  ALLERGIES:  has No Known Allergies.  MEDICATIONS:  Current Outpatient Medications  Medication Sig Dispense Refill  . atorvastatin (LIPITOR) 40 MG tablet Take 1 tablet (40 mg total) by mouth daily. 90 tablet 1  . Blood Glucose Monitoring Suppl (ACCU-CHEK AVIVA PLUS) W/DEVICE KIT Check Blood sugar AM fasting and before dinner 1 kit 0  . cholecalciferol (VITAMIN D) 1000 units tablet Take 1,000 Units by mouth daily.    . ferrous sulfate 325 (65 FE) MG tablet Take 1 tablet (325 mg total) by mouth daily before breakfast. 90 tablet 1  . glucose blood (ACCU-CHEK AVIVA) test strip Use as instructed 100 each 12  . Lancet Devices (ACCU-CHEK SOFTCLIX) lancets Use as instructed 1 each 0  . lidocaine-prilocaine (EMLA) cream Apply 1  application topically as needed (for port access).    Marland Kitchen lisinopril (PRINIVIL,ZESTRIL) 10 MG tablet Take 1 tablet (10 mg total) by mouth daily. 30 tablet 1  . metFORMIN (GLUCOPHAGE XR) 500 MG 24 hr tablet Take 2 tablets (1,000 mg total) by mouth daily with breakfast. 180 tablet 0  . Omega-3  Fatty Acids (FISH OIL OMEGA-3 PO) Take 1 capsule by mouth daily.     . TRUEPLUS LANCETS 28G MISC USE AS DIRECTED BY PHYSICIAN 100 each 0   Current Facility-Administered Medications  Medication Dose Route Frequency Provider Last Rate Last Dose  . 0.9 %  sodium chloride infusion  500 mL Intravenous Once Nandigam, Venia Minks, MD        PHYSICAL EXAMINATION: ECOG PERFORMANCE STATUS: 1 - Symptomatic but completely ambulatory  Vitals:   12/21/17 0900  BP: 123/82  Pulse: 87  Resp: 18  Temp: 98 F (36.7 C)  SpO2: 100%   Filed Weights   12/21/17 0900  Weight: 156 lb 4.8 oz (70.9 kg)    GENERAL:alert, no distress and comfortable SKIN: skin color, texture, turgor are normal, no rashes or significant lesions EYES: normal, Conjunctiva are pink and non-injected, sclera clear OROPHARYNX:no exudate, no erythema and lips, buccal mucosa, and tongue normal  NECK: supple, thyroid normal size, non-tender, without nodularity LYMPH:  no palpable lymphadenopathy in the cervical, axillary or inguinal LUNGS: clear to auscultation and percussion with normal breathing effort HEART: regular rate & rhythm and no murmurs and no lower extremity edema ABDOMEN:abdomen soft, non-tender and normal bowel sounds MUSCULOSKELETAL:no cyanosis of digits and no clubbing  NEURO: alert & oriented x 3 with fluent speech, no focal motor/sensory deficits EXTREMITIES: No lower extremity edema   LABORATORY DATA:  I have reviewed the data as listed CMP Latest Ref Rng & Units 12/21/2017 11/30/2017 11/10/2017  Glucose 70 - 140 mg/dL 120 107 113  BUN 7 - 26 mg/dL _0 Creatinine 0.60 - 1.10 mg/dL 0.73 0.72 0.73  Sodium 136 - 145 mmol/L 141 141 141  Potassium 3.5 - 5.1 mmol/L 4.1 3.8 3.9  Chloride 98 - 109 mmol/L 106 108 104  CO2 22 - 29 mmol/L _1 Calcium 8.4 - 10.4 mg/dL 9.4 9.5 9.7  Total Protein 6.4 - 8.3 g/dL 7.6 7.5 7.1  Total Bilirubin 0.2 - 1.2 mg/dL <0.2(L) 0.4 0.4  Alkaline Phos 40 - 150 U/L 93 87 78    AST 5 - 34 U/L _2 ALT 0 - 55 U/L _3 Lab Results  Component Value Date   WBC 8.3 12/21/2017   HGB 8.2 (L) 11/05/2017   HCT 35.5 12/21/2017   MCV 94.7 12/21/2017   PLT 243 12/21/2017   NEUTROABS 5.2 12/21/2017    ASSESSMENT & PLAN:  Malignant neoplasm of lower-outer quadrant of left breast of female, estrogen receptor negative ([place]) 06/09/2017: Left lumpectomy: IDC grade 3, 1.2 cm, DCIS is present, margins negative, 0/6 lymph nodes negative, ER 0%, PR 0%, HER-2 positive ratio 2.28, Ki-67 20%, T1 BN 0 stage IA  Treatment plan: 1. Adjuvant chemotherapy with Taxol Herceptin weekly 12 followed by Herceptin every 3 weeks for total of 6 months 2. Followed by adjuvant radiation to start around 11/16/17  Echo on 08/17/17 demonstrates LVEF 55-60% ____________________________________________________________________  Current treatment: Herceptin I discussed the pros and cons of 1 year of Herceptin versus 6 months.  Based on further review of the clinical trial, we determined that 6 months may not  be an adequate treatment for her.  Toxicities of Herceptin: None  Return to clinic every 3 weeks for Herceptin and every 6 weeks for follow-up with me   Fallopian tube cancer, carcinoma, right ([place] 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 Denman George recommended Taxol and carboplatin.  We decided to administer 3 doses of Taxol and carboplatin  Current Treatment: Taxol and carboplatin added to Herceptin treatment every 3 weeks.  She will start this treatment on 01/11/2018 after radiation is complete  Labs were reviewed Return to clinic in 3 weeks for cycle 1 Taxol and carboplatin  No orders of the defined types were placed in this encounter.  The patient has a good understanding of the overall plan. she agrees with it. she will  call with any problems that may develop before the next visit here.   Harriette Ohara, MD 12/21/17

## 2017-12-22 ENCOUNTER — Ambulatory Visit
Admission: RE | Admit: 2017-12-22 | Discharge: 2017-12-22 | Disposition: A | Discharge status: Home / Self Care | Payer: Medicaid Other | Source: Ambulatory Visit | Attending: Radiation Oncology | Admitting: Radiation Oncology

## 2017-12-23 ENCOUNTER — Ambulatory Visit
Admission: RE | Admit: 2017-12-23 | Discharge: 2017-12-23 | Disposition: A | Discharge status: Home / Self Care | Payer: Medicaid Other | Source: Ambulatory Visit | Attending: Radiation Oncology | Admitting: Radiation Oncology

## 2017-12-24 ENCOUNTER — Ambulatory Visit
Admission: RE | Admit: 2017-12-24 | Discharge: 2017-12-24 | Disposition: A | Discharge status: Home / Self Care | Payer: Medicaid Other | Source: Ambulatory Visit | Attending: Radiation Oncology | Admitting: Radiation Oncology

## 2017-12-27 ENCOUNTER — Ambulatory Visit
Admission: RE | Admit: 2017-12-27 | Discharge: 2017-12-27 | Disposition: A | Discharge status: Home / Self Care | Payer: Medicaid Other | Source: Ambulatory Visit | Attending: Radiation Oncology | Admitting: Radiation Oncology

## 2017-12-28 ENCOUNTER — Ambulatory Visit
Admission: RE | Admit: 2017-12-28 | Discharge: 2017-12-28 | Disposition: A | Discharge status: Home / Self Care | Payer: Medicaid Other | Source: Ambulatory Visit | Attending: Radiation Oncology | Admitting: Radiation Oncology

## 2017-12-29 ENCOUNTER — Ambulatory Visit
Admission: RE | Admit: 2017-12-29 | Discharge: 2017-12-29 | Disposition: A | Discharge status: Home / Self Care | Payer: Medicaid Other | Source: Ambulatory Visit | Attending: Radiation Oncology | Admitting: Radiation Oncology

## 2017-12-30 ENCOUNTER — Ambulatory Visit
Admission: RE | Admit: 2017-12-30 | Discharge: 2017-12-30 | Disposition: A | Discharge status: Home / Self Care | Payer: Medicaid Other | Source: Ambulatory Visit | Attending: Radiation Oncology | Admitting: Radiation Oncology

## 2017-12-31 ENCOUNTER — Ambulatory Visit: Payer: Medicaid Other | Admitting: Radiation Oncology

## 2017-12-31 ENCOUNTER — Ambulatory Visit
Admission: RE | Admit: 2017-12-31 | Discharge: 2017-12-31 | Disposition: A | Discharge status: Home / Self Care | Payer: Medicaid Other | Source: Ambulatory Visit | Attending: Radiation Oncology | Admitting: Radiation Oncology

## 2017-12-31 DIAGNOSIS — Z171 Estrogen receptor negative status [ER-]: Principal | ICD-10-CM

## 2017-12-31 DIAGNOSIS — C50512 Malignant neoplasm of lower-outer quadrant of left female breast: Secondary | ICD-10-CM

## 2017-12-31 MED ORDER — RADIAPLEXRX EX GEL
Freq: Two times a day (BID) | CUTANEOUS | Status: DC
  Administered 2017-12-31: 13:00:00 via TOPICAL

## 2018-01-03 ENCOUNTER — Ambulatory Visit
Admission: RE | Admit: 2018-01-03 | Discharge: 2018-01-03 | Disposition: A | Discharge status: Home / Self Care | Payer: Medicaid Other | Source: Ambulatory Visit | Attending: Radiation Oncology | Admitting: Radiation Oncology

## 2018-01-03 ENCOUNTER — Ambulatory Visit: Payer: Medicaid Other

## 2018-01-04 ENCOUNTER — Ambulatory Visit: Payer: Medicaid Other

## 2018-01-04 ENCOUNTER — Inpatient Hospital Stay: Payer: Medicaid Other

## 2018-01-04 ENCOUNTER — Encounter: Payer: Self-pay | Admitting: Genetics

## 2018-01-04 ENCOUNTER — Inpatient Hospital Stay (HOSPITAL_BASED_OUTPATIENT_CLINIC_OR_DEPARTMENT_OTHER): Payer: Medicaid Other | Admitting: Genetics

## 2018-01-04 ENCOUNTER — Ambulatory Visit
Admission: RE | Admit: 2018-01-04 | Discharge: 2018-01-04 | Disposition: A | Discharge status: Home / Self Care | Payer: Medicaid Other | Source: Ambulatory Visit | Attending: Radiation Oncology | Admitting: Radiation Oncology

## 2018-01-04 DIAGNOSIS — C50512 Malignant neoplasm of lower-outer quadrant of left female breast: Secondary | ICD-10-CM

## 2018-01-04 DIAGNOSIS — Z800 Family history of malignant neoplasm of digestive organs: Secondary | ICD-10-CM | POA: Diagnosis not present

## 2018-01-04 DIAGNOSIS — Z1379 Encounter for other screening for genetic and chromosomal anomalies: Secondary | ICD-10-CM

## 2018-01-04 DIAGNOSIS — Z803 Family history of malignant neoplasm of breast: Secondary | ICD-10-CM | POA: Diagnosis not present

## 2018-01-04 DIAGNOSIS — Z171 Estrogen receptor negative status [ER-]: Secondary | ICD-10-CM

## 2018-01-04 DIAGNOSIS — C5701 Malignant neoplasm of right fallopian tube: Secondary | ICD-10-CM | POA: Diagnosis not present

## 2018-01-04 NOTE — Progress Notes (Signed)
REFERRING PROVIDER: [name], MD [place], [place] [address]  PRIMARY PROVIDER:  [name], NP  PRIMARY REASON FOR VISIT:  1. Malignant neoplasm of lower-outer quadrant of left breast of female, estrogen receptor negative ([place])   2. Family history of breast cancer   3. Family history of colon cancer   4. Fallopian tube cancer, carcinoma, right ([place])     HISTORY OF PRESENT ILLNESS:   Erin [name], a 57 y.o. female, was seen for a [facility] cancer genetics consultation at the request of Dr. Denman George due to a personal and family history of cancer.  Erin [name] presents to clinic today to discuss the possibility of a hereditary predisposition to cancer, genetic testing, and to further clarify her future cancer risks, as well as potential cancer risks for family members.   On 05/11/2017, at the age of 29, Erin [name] was diagnosed with invasive ductal carcinoma with DCIS ER/PR -, HER2 + of the left breast.   She had a left lumpectomy on 06/09/2017, and is now undergoing adjuvant chemotherapy.  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    CANCER HISTORY:    Malignant neoplasm of lower-outer quadrant of left breast of female, estrogen receptor negative ([place])   05/11/2017 Initial Diagnosis    Left breast biopsy 3:30 position 6 cm from nipple: IDC with DCIS, lymphovascular invasion present, grade 2-3, ER 0%, PR 0%, HER-2 positive ratio 2.28, Ki-67 20%, 1 cm lesion left breast, T1b N0 stage IA clinical stage      06/09/2017 Surgery    Left lumpectomy: IDC grade 3, 1.2 cm, DCIS is present, margins negative, 0/6 lymph nodes negative, ER 0%, PR 0%, HER-2 positive ratio 2.28, Ki-67 20%, T1 BN 0 stage IA      07/09/2017 -  Adjuvant Chemotherapy    Taxol/Herceptin completed on 09/29/2017, went on to receive every 3 week adjuvant Herceptin on 10/20/2017      12/21/2017 -  Chemotherapy    The patient had palonosetron (ALOXI) injection 0.25 mg, 0.25  mg, Intravenous,  Once, 0 of 3 cycles pegfilgrastim (NEULASTA ONPRO KIT) injection 6 mg, 6 mg, Subcutaneous, Once, 0 of 3 cycles CARBOplatin (PARAPLATIN) 580 mg in sodium chloride 0.9 % 250 mL chemo infusion, 580 mg (100 % of original dose 575 mg), Intravenous,  Once, 0 of 3 cycles Dose modification:   (original dose 575 mg, Cycle 1) PACLitaxel (TAXOL) 306 mg in dextrose 5 % 500 mL chemo infusion (> 19m/m2), 175 mg/m2 = 306 mg, Intravenous,  Once, 0 of 3 cycles  for chemotherapy treatment.        Fallopian tube cancer, carcinoma, right ([place]   11/04/2017 Surgery    Uterus, cervix and bilateral fallopian tubes: Fallopian tube with serous carcinoma 0.6 cm positive for p53, PAX8, WT-1, MOC-31, cytokeratin 7, and estrogen receptor      12/21/2017 -  Chemotherapy    The patient had palonosetron (ALOXI) injection 0.25 mg, 0.25 mg, Intravenous,  Once, 0 of 3 cycles pegfilgrastim (NEULASTA ONPRO KIT) injection 6 mg, 6 mg, Subcutaneous, Once, 0 of 3 cycles CARBOplatin (PARAPLATIN) 580 mg in sodium chloride 0.9 % 250 mL chemo infusion, 580 mg (100 % of original dose 575 mg), Intravenous,  Once, 0 of 3 cycles Dose modification:   (original dose 575 mg, Cycle 1) PACLitaxel (TAXOL) 306 mg in dextrose 5 % 500 mL chemo infusion (> 877mm2), 175 mg/m2 = 306 mg, Intravenous,  Once,  0 of 3 cycles  for chemotherapy treatment.         HORMONAL RISK FACTORS:  Menarche was at age 29.  First live birth at age 47.  Ovaries intact: no.  Hysterectomy: yes.  Menopausal status: postmenopausal.  Colonoscopy: yes; Feb 2019, reportedly 2 polyps removed.   Past Medical History:  Diagnosis Date  . Anemia   . Arthritis    knees, elbows  . Breast cancer ([place]) 05/2017   left  . Cough 06/03/2017  . Deaf   . Depression   . Dyspnea    states some SOB with ADLs and aanemia   . Family history of breast cancer   . Family history of breast cancer   . Family history of colon cancer   . High cholesterol   .  History of blood transfusion 07/2017   [facility]  . Hypertension   . Non-insulin dependent type 2 diabetes mellitus ([place])   . Runny nose 06/03/2017   clear drainage, per pt.  . SVD (spontaneous vaginal delivery)    x 2    Past Surgical History:  Procedure Laterality Date  . BREAST LUMPECTOMY WITH RADIOACTIVE SEED AND SENTINEL LYMPH NODE BIOPSY Left 06/09/2017   Procedure: BREAST LUMPECTOMY WITH RADIOACTIVE SEED AND SENTINEL LYMPH NODE BIOPSY;  Surgeon: Excell Seltzer, MD;  Location: [facility];  Service: General;  Laterality: Left;  . COLONOSCOPY  11/02/2017   polyps  . LAPAROSCOPIC ASSISTED VAGINAL HYSTERECTOMY N/A 11/04/2017   Procedure: LAPAROSCOPIC ASSISTED VAGINAL HYSTERECTOMY;  Surgeon: [name], MD;  Location: [facility];  Service: Gynecology;  Laterality: N/A;  . LAPAROSCOPIC BILATERAL SALPINGO OOPHERECTOMY Bilateral 11/04/2017   Procedure: LAPAROSCOPIC BILATERAL SALPINGO OOPHORECTOMY;  Surgeon: [name], MD;  Location: [facility];  Service: Gynecology;  Laterality: Bilateral;  . MULTIPLE TOOTH EXTRACTIONS    . PORTACATH PLACEMENT Right 06/09/2017   Procedure: INSERTION PORT-A-CATH WITH [place];  Surgeon: Excell Seltzer, MD;  Location: [facility];  Service: General;  Laterality: Right;  . TUBAL LIGATION  02/04/2002  . UPPER GI ENDOSCOPY      Social History   Socioeconomic History  . Marital status: Legally Separated    Spouse name: Not on file  . Number of children: Not on file  . Years of education: Not on file  . Highest education level: Not on file  Occupational History  . Not on file  Social Needs  . Financial resource strain: Not on file  . Food insecurity:    Worry: Not on file    Inability: Not on file  . Transportation needs:    Medical: Not on file    Non-medical: Not on file  Tobacco Use  . Smoking status: Never Smoker  . Smokeless tobacco: Never Used  Substance and Sexual Activity  . Alcohol use: No  . Drug use: No  .  Sexual activity: Never    Birth control/protection: None  Lifestyle  . Physical activity:    Days per week: Not on file    Minutes per session: Not on file  . Stress: Not on file  Relationships  . Social connections:    Talks on phone: Not on file    Gets together: Not on file    Attends religious service: Not on file    Active member of club or organization: Not on file    Attends meetings of clubs or organizations: Not on file    Relationship status: Not on file  Other Topics Concern  .  Not on file  Social History Narrative   1 boy and 1 girl   Resides in [place]:  We obtained a detailed, 4-generation family history.  Significant diagnoses are listed below: Family History  Problem Relation Age of Onset  . Hypertension Mother   . Diabetes Father   . Hypertension Sister   . Diabetes Paternal Grandmother   . Colon cancer Paternal Grandmother 59  . CAD Brother   . Diabetes Brother   . Breast cancer Maternal Aunt        dx >50  . Heart attack Paternal Grandfather   . Breast cancer Maternal Aunt        dx under 59  . Breast cancer Maternal Aunt        dx  under 67   Ms. Saltos has a 14 year-old son and a 53 year-old daughter with no history of cancer.  Ms. Harewood has a brother who died due to heart disease, and a sister who is in her 62's with no history of cancer.  Her brother has 3 children, and her sister has 1 daughter.  Ms. Womac has a paternal half-brother, but does not know any information about his health.   Ms. Francesconi father: died at 9 with no history of cancer.  Paternal Aunts/Uncles: 1 paternal uncle with no history of cancer.  Paternal cousins: 3 paternal cousins- no history of cancer.  Paternal grandfather: died of a heart attack.  Paternal grandmother:history of colon cancer dx in her 59's.   Ms. Nieblas mother: alive, 23, no history of cancer.  She has her uterus and ovaries intact.   Maternal Aunts/Uncles: 3 maternal  half- aungs (through maternal GF) with a history of breast cancer.  2 were diagnosed with breast cancer under the age of 75, and the other was diagnosed over 87 years of age.  Maternal cousins: no history of cancer reported.  Maternal grandfather: no history of cancer.  Maternal grandmother: alive, 82yo, no history of cancer.   Ms. Hartl is unaware of previous family history of genetic testing for hereditary cancer risks. Patient's maternal ancestors are of African American descent, and paternal ancestors are of African American descent. There is no reported Ashkenazi Jewish ancestry. There is no known consanguinity.  GENETIC COUNSELING ASSESSMENT: BINDU DOCTER is a 57 y.o. female with a personal and family history which is somewhat suggestive of a Hereditary Cancer Predisposition Syndrome. We, therefore, discussed and recommended the following at today's visit.   DISCUSSION: We reviewed the characteristics, features and inheritance patterns of hereditary cancer syndromes. We also discussed genetic testing, including the appropriate family members to test, the process of testing, insurance coverage and turn-around-time for results. We discussed the implications of a negative, positive and/or variant of uncertain significant result. We recommended Ms. Mickiewicz pursue genetic testing for the Common Hereditary Cancer gene panel + HRD tumor testing.    The CancerNext gene panel offered by [employer] includes sequencing and rearrangement analysis for the following 32 genes:   APC, ATM, BARD1, BMPR1A, BRCA1, BRCA2, BRIP1, CDH1, CDK4, CDKN2A, CHEK2, DICER1, EPCAM, GREM1, HOXB13MLH1, MRE11A, MSH2, MSH6, MUTYH, NBN, NF1, PALB2, PMS2, POLD1, POLE, PTEN, RAD50, RAD51D, SMAD4, SMARCA4, STK11, and TP53.   We discussed that genetic testing through [facility] will test for hereditary mutations that could explain her diagnosis of cancer.  However, homologous recombination testing (HRD) is genetic testing performed  on her tumor that can determine genetic changes that could  influence her management.  HRD testing is performed in tandem with genetic testing, and typically at no additional cost.   We discussed that only 5-10% of cancers are associated with a Hereditary cancer predisposition syndrome.  One of the most common hereditary cancer syndromes that increases breast cancer risk is called Hereditary Breast and Ovarian Cancer (HBOC) syndrome.  This syndrome is caused by mutations in the BRCA1 and BRCA2 genes.  This syndrome increases an individual's lifetime risk to develop breast, ovarian, pancreatic, and other types of cancer.  There are also many other cancer predisposition syndromes caused by mutations in several other genes.  We discussed that if she is found to have a mutation in one of these genes, it may impact future medical management recommendations such as increased cancer screenings and consideration of risk reducing surgeries.  A positive result could also have implications for the patient's family members.  A Negative result would mean we were unable to identify a hereditary component to her cancer, but does not rule out the possibility of a hereditary basis for her cancers.  There could be mutations that are undetectable by current technology, or in genes not yet tested or identified to increase cancer risk.    We discussed the potential to find a Variant of Uncertain Significance or VUS.  These are variants that have not yet been identified as pathogenic or benign, and it is unknown if this variant is associated with increased cancer risk or if this is a normal finding.  Most VUS's are reclassified to benign or likely benign.   It should not be used to make medical management decisions. With time, we suspect the lab will determine the significance of any VUS's identified if any.   Based on Ms. Cavazos's personal and family history of cancer, she meets medical criteria for genetic testing. Despite  that she meets criteria, she may still have an out of pocket cost. We discussed that if her out of pocket cost for testing is over $100, the laboratory will call and confirm whether she wants to proceed with testing.  If the out of pocket cost of testing is less than $100 she will be billed by the genetic testing laboratory.  We currently have submitted a benefit investigation regarding coverage of TumorNext HRD- We will call Ms. Schulenburg's mother per Ms. Laning's request regarding the estimated OOP cost for germline testing vs. Germline + tumor testing.   PLAN: After considering the risks, benefits, and limitations, Ms. Laurance Flatten  provided informed consent to pursue genetic testing.  We will wait to hear about insurance covarage and inform Ms. Ohms's mother about this estimate.  At that time, Ms. Woodring will decide if she would like germline only or germline + tumor HRD testing performed.     We encouraged Ms. Siemon to remain in contact with cancer genetics annually so that we can continuously update the family history and inform her of any changes in cancer genetics and testing that may be of benefit for her family. Ms. Geng questions were answered to her satisfaction today. Our contact information was provided should additional questions or concerns arise.  Based on Ms. Hlad's family history, we recommended her maternal relatives also have genetic counseling and testing. Ms. Erney will let us know if we can be of any assistance in coordinating genetic counseling and/or testing for this family member.   Lastly, we encouraged Ms. Gaona to remain in contact with cancer genetics annually so that we can continuously update the  family history and inform her of any changes in cancer genetics and testing that may be of benefit for this family.   Ms.  Nulty questions were answered to her satisfaction today. Our contact information was provided should additional questions or concerns arise. Thank you for the  referral and allowing [place] to share in the care of your patient.   Tana Felts, MS, [facility] Certified Genetic Counselor [email or web address] phone: [phone]  The patient was seen for a total of 40 minutes in face-to-face genetic counseling. The patient was accompanied today by mother.  This patient was discussed with Drs. Magrinat, [name] and/or Burr Medico who agrees with the above.

## 2018-01-05 ENCOUNTER — Telehealth: Payer: Self-pay | Admitting: Genetics

## 2018-01-05 ENCOUNTER — Encounter (HOSPITAL_COMMUNITY): Payer: Self-pay | Admitting: Gynecologic Oncology

## 2018-01-05 ENCOUNTER — Ambulatory Visit
Admission: RE | Admit: 2018-01-05 | Discharge: 2018-01-05 | Disposition: A | Discharge status: Home / Self Care | Payer: Medicaid Other | Source: Ambulatory Visit | Attending: Radiation Oncology | Admitting: Radiation Oncology

## 2018-01-05 NOTE — Telephone Encounter (Signed)
Called to discuss update I received from the lab regarding insurance coverage for genetic testing.  Left message

## 2018-01-06 ENCOUNTER — Ambulatory Visit
Admission: RE | Admit: 2018-01-06 | Discharge: 2018-01-06 | Disposition: A | Discharge status: Home / Self Care | Payer: Medicaid Other | Source: Ambulatory Visit | Attending: Radiation Oncology | Admitting: Radiation Oncology

## 2018-01-07 ENCOUNTER — Ambulatory Visit
Admission: RE | Admit: 2018-01-07 | Discharge: 2018-01-07 | Disposition: A | Discharge status: Home / Self Care | Payer: Medicaid Other | Source: Ambulatory Visit | Attending: Radiation Oncology | Admitting: Radiation Oncology

## 2018-01-07 ENCOUNTER — Telehealth: Payer: Self-pay | Admitting: Genetics

## 2018-01-07 ENCOUNTER — Ambulatory Visit: Payer: Medicaid Other

## 2018-01-10 ENCOUNTER — Encounter: Payer: Self-pay | Admitting: Radiation Oncology

## 2018-01-10 ENCOUNTER — Ambulatory Visit
Admission: RE | Admit: 2018-01-10 | Discharge: 2018-01-10 | Disposition: A | Discharge status: Home / Self Care | Payer: Medicaid Other | Source: Ambulatory Visit | Attending: Radiation Oncology | Admitting: Radiation Oncology

## 2018-01-11 ENCOUNTER — Inpatient Hospital Stay: Payer: Medicaid Other

## 2018-01-11 VITALS — BP 105/58 | HR 81 | Temp 98.1°F | Resp 16 | Ht 61.0 in | Wt 155.5 lb

## 2018-01-11 DIAGNOSIS — Z95828 Presence of other vascular implants and grafts: Secondary | ICD-10-CM

## 2018-01-11 DIAGNOSIS — Z5112 Encounter for antineoplastic immunotherapy: Secondary | ICD-10-CM | POA: Diagnosis not present

## 2018-01-11 DIAGNOSIS — C5701 Malignant neoplasm of right fallopian tube: Secondary | ICD-10-CM

## 2018-01-11 DIAGNOSIS — C50512 Malignant neoplasm of lower-outer quadrant of left female breast: Secondary | ICD-10-CM

## 2018-01-11 DIAGNOSIS — Z171 Estrogen receptor negative status [ER-]: Principal | ICD-10-CM

## 2018-01-11 LAB — CBC WITH DIFFERENTIAL (CANCER CENTER ONLY)
Basophils Absolute: 0 10*3/uL (ref 0.0–0.1)
Basophils Relative: 0 %
EOS PCT: 1 %
Eosinophils Absolute: 0.1 10*3/uL (ref 0.0–0.5)
HCT: 35.2 % (ref 34.8–46.6)
Hemoglobin: 11.2 g/dL — ABNORMAL LOW (ref 11.6–15.9)
LYMPHS ABS: 1.7 10*3/uL (ref 0.9–3.3)
Lymphocytes Relative: 20 %
MCH: 30.3 pg (ref 25.1–34.0)
MCHC: 31.8 g/dL (ref 31.5–36.0)
MCV: 95.1 fL (ref 79.5–101.0)
MONO ABS: 0.8 10*3/uL (ref 0.1–0.9)
Monocytes Relative: 9 %
Neutro Abs: 6 10*3/uL (ref 1.5–6.5)
Neutrophils Relative %: 70 %
PLATELETS: 239 10*3/uL (ref 145–400)
RBC: 3.7 MIL/uL (ref 3.70–5.45)
RDW: 14.5 % (ref 11.2–14.5)
WBC: 8.5 10*3/uL (ref 3.9–10.3)

## 2018-01-11 LAB — CMP (CANCER CENTER ONLY)
ALBUMIN: 3.7 g/dL (ref 3.5–5.0)
ALK PHOS: 85 U/L (ref 40–150)
ALT: 23 U/L (ref 0–55)
AST: 16 U/L (ref 5–34)
Anion gap: 8 (ref 3–11)
BUN: 12 mg/dL (ref 7–26)
CALCIUM: 9.7 mg/dL (ref 8.4–10.4)
CO2: 25 mmol/L (ref 22–29)
Chloride: 107 mmol/L (ref 98–109)
Creatinine: 0.72 mg/dL (ref 0.60–1.10)
GFR, Est AFR Am: >60 mL/min (ref >60)
GFR, Estimated: >60 mL/min (ref >60)
GLUCOSE: 111 mg/dL (ref 70–140)
Potassium: 4.1 mmol/L (ref 3.5–5.1)
SODIUM: 140 mmol/L (ref 136–145)
Total Bilirubin: <0.2 mg/dL — ABNORMAL LOW (ref 0.2–1.2)
Total Protein: 7.7 g/dL (ref 6.4–8.3)

## 2018-01-11 MED ORDER — ACETAMINOPHEN 325 MG PO TABS
650.0000 mg | ORAL_TABLET | Freq: Once | ORAL | Status: AC
  Administered 2018-01-11: 650 mg via ORAL

## 2018-01-11 MED ORDER — TRASTUZUMAB CHEMO 150 MG IV SOLR
450.0000 mg | Freq: Once | INTRAVENOUS | Status: AC
  Administered 2018-01-11: 450 mg via INTRAVENOUS
  Filled 2018-01-11: qty 21.43

## 2018-01-11 MED ORDER — SODIUM CHLORIDE 0.9 % IV SOLN
Freq: Once | INTRAVENOUS | Status: AC
  Administered 2018-01-11: 10:00:00 via INTRAVENOUS
  Filled 2018-01-11: qty 5

## 2018-01-11 MED ORDER — DIPHENHYDRAMINE HCL 50 MG/ML IJ SOLN
50.0000 mg | Freq: Once | INTRAMUSCULAR | Status: AC
  Administered 2018-01-11: 50 mg via INTRAVENOUS

## 2018-01-11 MED ORDER — FAMOTIDINE IN NACL 20-0.9 MG/50ML-% IV SOLN
INTRAVENOUS | Status: AC
  Filled 2018-01-11: qty 50

## 2018-01-11 MED ORDER — PEGFILGRASTIM 6 MG/0.6ML ~~LOC~~ PSKT
PREFILLED_SYRINGE | SUBCUTANEOUS | Status: AC
  Filled 2018-01-11: qty 0.6

## 2018-01-11 MED ORDER — DIPHENHYDRAMINE HCL 50 MG/ML IJ SOLN
INTRAMUSCULAR | Status: AC
  Filled 2018-01-11: qty 1

## 2018-01-11 MED ORDER — PEGFILGRASTIM 6 MG/0.6ML ~~LOC~~ PSKT
6.0000 mg | PREFILLED_SYRINGE | Freq: Once | SUBCUTANEOUS | Status: AC
  Administered 2018-01-11: 6 mg via SUBCUTANEOUS

## 2018-01-11 MED ORDER — PALONOSETRON HCL INJECTION 0.25 MG/5ML
0.2500 mg | Freq: Once | INTRAVENOUS | Status: AC
  Administered 2018-01-11: 0.25 mg via INTRAVENOUS

## 2018-01-11 MED ORDER — SODIUM CHLORIDE 0.9 % IV SOLN
Freq: Once | INTRAVENOUS | Status: AC
  Administered 2018-01-11: 10:00:00 via INTRAVENOUS

## 2018-01-11 MED ORDER — FAMOTIDINE IN NACL 20-0.9 MG/50ML-% IV SOLN
20.0000 mg | Freq: Once | INTRAVENOUS | Status: AC
  Administered 2018-01-11: 20 mg via INTRAVENOUS

## 2018-01-11 MED ORDER — SODIUM CHLORIDE 0.9 % IV SOLN
575.0000 mg | Freq: Once | INTRAVENOUS | Status: AC
  Administered 2018-01-11: 580 mg via INTRAVENOUS
  Filled 2018-01-11: qty 58

## 2018-01-11 MED ORDER — SODIUM CHLORIDE 0.9% FLUSH
10.0000 mL | INTRAVENOUS | Status: DC | PRN
  Administered 2018-01-11: 10 mL via INTRAVENOUS
  Filled 2018-01-11: qty 10

## 2018-01-11 MED ORDER — ACETAMINOPHEN 325 MG PO TABS
ORAL_TABLET | ORAL | Status: AC
  Filled 2018-01-11: qty 2

## 2018-01-11 MED ORDER — SODIUM CHLORIDE 0.9% FLUSH
10.0000 mL | INTRAVENOUS | Status: DC | PRN
  Administered 2018-01-11: 10 mL
  Filled 2018-01-11: qty 10

## 2018-01-11 MED ORDER — SODIUM CHLORIDE 0.9 % IV SOLN
20.0000 mg | Freq: Once | INTRAVENOUS | Status: DC
  Filled 2018-01-11: qty 2

## 2018-01-11 MED ORDER — PALONOSETRON HCL INJECTION 0.25 MG/5ML
INTRAVENOUS | Status: AC
  Filled 2018-01-11: qty 5

## 2018-01-11 MED ORDER — HEPARIN SOD (PORK) LOCK FLUSH 100 UNIT/ML IV SOLN
500.0000 [IU] | Freq: Once | INTRAVENOUS | Status: AC | PRN
Start: 2018-01-11 — End: 2018-01-11
  Administered 2018-01-11: 500 [IU]
  Filled 2018-01-11: qty 5

## 2018-01-11 MED ORDER — SODIUM CHLORIDE 0.9 % IV SOLN
175.0000 mg/m2 | Freq: Once | INTRAVENOUS | Status: AC
  Administered 2018-01-11: 306 mg via INTRAVENOUS
  Filled 2018-01-11: qty 51

## 2018-01-11 NOTE — Patient Instructions (Signed)
Implanted Port Home Guide An implanted port is a type of central line that is placed under the skin. Central lines are used to provide IV access when treatment or nutrition needs to be given through a person's veins. Implanted ports are used for long-term IV access. An implanted port may be placed because:  You need IV medicine that would be irritating to the small veins in your hands or arms.  You need long-term IV medicines, such as antibiotics.  You need IV nutrition for a long period.  You need frequent blood draws for lab tests.  You need dialysis.  Implanted ports are usually placed in the chest area, but they can also be placed in the upper arm, the abdomen, or the leg. An implanted port has two main parts:  Reservoir. The reservoir is round and will appear as a small, raised area under your skin. The reservoir is the part where a needle is inserted to give medicines or draw blood.  Catheter. The catheter is a thin, flexible tube that extends from the reservoir. The catheter is placed into a large vein. Medicine that is inserted into the reservoir goes into the catheter and then into the vein.  How will I care for my incision site? Do not get the incision site wet. Bathe or shower as directed by your health care provider. How is my port accessed? Special steps must be taken to access the port:  Before the port is accessed, a numbing cream can be placed on the skin. This helps numb the skin over the port site.  Your health care provider uses a sterile technique to access the port. ? Your health care provider must put on a mask and sterile gloves. ? The skin over your port is cleaned carefully with an antiseptic and allowed to dry. ? The port is gently pinched between sterile gloves, and a needle is inserted into the port.  Only "non-coring" port needles should be used to access the port. Once the port is accessed, a blood return should be checked. This helps ensure that the port  is in the vein and is not clogged.  If your port needs to remain accessed for a constant infusion, a clear (transparent) bandage will be placed over the needle site. The bandage and needle will need to be changed every week, or as directed by your health care provider.  Keep the bandage covering the needle clean and dry. Do not get it wet. Follow your health care provider's instructions on how to take a shower or bath while the port is accessed.  If your port does not need to stay accessed, no bandage is needed over the port.  What is flushing? Flushing helps keep the port from getting clogged. Follow your health care provider's instructions on how and when to flush the port. Ports are usually flushed with saline solution or a medicine called heparin. The need for flushing will depend on how the port is used.  If the port is used for intermittent medicines or blood draws, the port will need to be flushed: ? After medicines have been given. ? After blood has been drawn. ? As part of routine maintenance.  If a constant infusion is running, the port may not need to be flushed.  How long will my port stay implanted? The port can stay in for as long as your health care provider thinks it is needed. When it is time for the port to come out, surgery will be   done to remove it. The procedure is similar to the one performed when the port was put in. When should I seek immediate medical care? When you have an implanted port, you should seek immediate medical care if:  You notice a bad smell coming from the incision site.  You have swelling, redness, or drainage at the incision site.  You have more swelling or pain at the port site or the surrounding area.  You have a fever that is not controlled with medicine.  This information is not intended to replace advice given to you by your health care provider. Make sure you discuss any questions you have with your health care provider. Document  Released: 08/31/2005 Document Revised: 02/06/2016 Document Reviewed: 05/08/2013 Elsevier Interactive Patient Education  2017 [employer]  

## 2018-01-11 NOTE — Patient Instructions (Signed)
[place] Discharge Instructions for Patients Receiving Chemotherapy  Today you received the following chemotherapy agents:  Herceptin, Taxol, Carboplatin  To help prevent nausea and vomiting after your treatment, we encourage you to take your nausea medication as prescribed.   If you develop nausea and vomiting that is not controlled by your nausea medication, call the clinic.   BELOW ARE SYMPTOMS THAT SHOULD BE REPORTED IMMEDIATELY:  *FEVER GREATER THAN 100.5 F  *CHILLS WITH OR WITHOUT FEVER  NAUSEA AND VOMITING THAT IS NOT CONTROLLED WITH YOUR NAUSEA MEDICATION  *UNUSUAL SHORTNESS OF BREATH  *UNUSUAL BRUISING OR BLEEDING  TENDERNESS IN MOUTH AND THROAT WITH OR WITHOUT PRESENCE OF ULCERS  *URINARY PROBLEMS  *BOWEL PROBLEMS  UNUSUAL RASH Items with * indicate a potential emergency and should be followed up as soon as possible.  Feel free to call the clinic should you have any questions or concerns. The clinic phone number is [phone].  Please show the [place] at check-in to the Emergency Department and triage nurse.    Carboplatin injection What is this medicine? CARBOPLATIN (KAR boe pla tin) is a chemotherapy drug. It targets fast dividing cells, like cancer cells, and causes these cells to die. This medicine is used to treat ovarian cancer and many other cancers. This medicine may be used for other purposes; ask your health care provider or pharmacist if you have questions. COMMON BRAND NAME(S): Paraplatin What should I tell my health care provider before I take this medicine? They need to know if you have any of these conditions: -blood disorders -hearing problems -kidney disease -recent or ongoing radiation therapy -an unusual or allergic reaction to carboplatin, cisplatin, other chemotherapy, other medicines, foods, dyes, or preservatives -pregnant or trying to get pregnant -breast-feeding How should I use this medicine? This  drug is usually given as an infusion into a vein. It is administered in a hospital or clinic by a specially trained health care professional. Talk to your pediatrician regarding the use of this medicine in children. Special care may be needed. Overdosage: If you think you have taken too much of this medicine contact a poison control center or emergency room at once. NOTE: This medicine is only for you. Do not share this medicine with others. What if I miss a dose? It is important not to miss a dose. Call your doctor or health care professional if you are unable to keep an appointment. What may interact with this medicine? -medicines for seizures -medicines to increase blood counts like filgrastim, pegfilgrastim, sargramostim -some antibiotics like amikacin, gentamicin, neomycin, streptomycin, tobramycin -vaccines Talk to your doctor or health care professional before taking any of these medicines: -acetaminophen -aspirin -ibuprofen -ketoprofen -naproxen This list may not describe all possible interactions. Give your health care provider a list of all the medicines, herbs, non-prescription drugs, or dietary supplements you use. Also tell them if you smoke, drink alcohol, or use illegal drugs. Some items may interact with your medicine. What should I watch for while using this medicine? Your condition will be monitored carefully while you are receiving this medicine. You will need important blood work done while you are taking this medicine. This drug may make you feel generally unwell. This is not uncommon, as chemotherapy can affect healthy cells as well as cancer cells. Report any side effects. Continue your course of treatment even though you feel ill unless your doctor tells you to stop. In some cases, you may be given additional medicines to help  with side effects. Follow all directions for their use. Call your doctor or health care professional for advice if you get a fever, chills or sore  throat, or other symptoms of a cold or flu. Do not treat yourself. This drug decreases your body's ability to fight infections. Try to avoid being around people who are sick. This medicine may increase your risk to bruise or bleed. Call your doctor or health care professional if you notice any unusual bleeding. Be careful brushing and flossing your teeth or using a toothpick because you may get an infection or bleed more easily. If you have any dental work done, tell your dentist you are receiving this medicine. Avoid taking products that contain aspirin, acetaminophen, ibuprofen, naproxen, or ketoprofen unless instructed by your doctor. These medicines may hide a fever. Do not become pregnant while taking this medicine. Women should inform their doctor if they wish to become pregnant or think they might be pregnant. There is a potential for serious side effects to an unborn child. Talk to your health care professional or pharmacist for more information. Do not breast-feed an infant while taking this medicine. What side effects may I notice from receiving this medicine? Side effects that you should report to your doctor or health care professional as soon as possible: -allergic reactions like skin rash, itching or hives, swelling of the face, lips, or tongue -signs of infection - fever or chills, cough, sore throat, pain or difficulty passing urine -signs of decreased platelets or bleeding - bruising, pinpoint red spots on the skin, black, tarry stools, nosebleeds -signs of decreased red blood cells - unusually weak or tired, fainting spells, lightheadedness -breathing problems -changes in hearing -changes in vision -chest pain -high blood pressure -low blood counts - This drug may decrease the number of white blood cells, red blood cells and platelets. You may be at increased risk for infections and bleeding. -nausea and vomiting -pain, swelling, redness or irritation at the injection site -pain,  tingling, numbness in the hands or feet -problems with balance, talking, walking -trouble passing urine or change in the amount of urine Side effects that usually do not require medical attention (report to your doctor or health care professional if they continue or are bothersome): -hair loss -loss of appetite -metallic taste in the mouth or changes in taste This list may not describe all possible side effects. Call your doctor for medical advice about side effects. You may report side effects to FDA at 1-800-FDA-1088. Where should I keep my medicine? This drug is given in a hospital or clinic and will not be stored at home. NOTE: This sheet is a summary. It may not cover all possible information. If you have questions about this medicine, talk to your doctor, pharmacist, or health care provider.  2018 Elsevier/Gold Standard (2007-12-06 14:38:05)   Paclitaxel injection What is this medicine? PACLITAXEL (PAK li TAX el) is a chemotherapy drug. It targets fast dividing cells, like cancer cells, and causes these cells to die. This medicine is used to treat ovarian cancer, breast cancer, and other cancers. This medicine may be used for other purposes; ask your health care provider or pharmacist if you have questions. COMMON BRAND NAME(S): Onxol, Taxol What should I tell my health care provider before I take this medicine? They need to know if you have any of these conditions: -blood disorders -irregular heartbeat -infection (especially a virus infection such as chickenpox, cold sores, or herpes) -liver disease -previous or ongoing radiation therapy -an  unusual or allergic reaction to paclitaxel, alcohol, polyoxyethylated castor oil, other chemotherapy agents, other medicines, foods, dyes, or preservatives -pregnant or trying to get pregnant -breast-feeding How should I use this medicine? This drug is given as an infusion into a vein. It is administered in a hospital or clinic by a  specially trained health care professional. Talk to your pediatrician regarding the use of this medicine in children. Special care may be needed. Overdosage: If you think you have taken too much of this medicine contact a poison control center or emergency room at once. NOTE: This medicine is only for you. Do not share this medicine with others. What if I miss a dose? It is important not to miss your dose. Call your doctor or health care professional if you are unable to keep an appointment. What may interact with this medicine? Do not take this medicine with any of the following medications: -disulfiram -metronidazole This medicine may also interact with the following medications: -cyclosporine -diazepam -ketoconazole -medicines to increase blood counts like filgrastim, pegfilgrastim, sargramostim -other chemotherapy drugs like cisplatin, doxorubicin, epirubicin, etoposide, teniposide, vincristine -quinidine -testosterone -vaccines -verapamil Talk to your doctor or health care professional before taking any of these medicines: -acetaminophen -aspirin -ibuprofen -ketoprofen -naproxen This list may not describe all possible interactions. Give your health care provider a list of all the medicines, herbs, non-prescription drugs, or dietary supplements you use. Also tell them if you smoke, drink alcohol, or use illegal drugs. Some items may interact with your medicine. What should I watch for while using this medicine? Your condition will be monitored carefully while you are receiving this medicine. You will need important blood work done while you are taking this medicine. This medicine can cause serious allergic reactions. To reduce your risk you will need to take other medicine(s) before treatment with this medicine. If you experience allergic reactions like skin rash, itching or hives, swelling of the face, lips, or tongue, tell your doctor or health care professional right away. In  some cases, you may be given additional medicines to help with side effects. Follow all directions for their use. This drug may make you feel generally unwell. This is not uncommon, as chemotherapy can affect healthy cells as well as cancer cells. Report any side effects. Continue your course of treatment even though you feel ill unless your doctor tells you to stop. Call your doctor or health care professional for advice if you get a fever, chills or sore throat, or other symptoms of a cold or flu. Do not treat yourself. This drug decreases your body's ability to fight infections. Try to avoid being around people who are sick. This medicine may increase your risk to bruise or bleed. Call your doctor or health care professional if you notice any unusual bleeding. Be careful brushing and flossing your teeth or using a toothpick because you may get an infection or bleed more easily. If you have any dental work done, tell your dentist you are receiving this medicine. Avoid taking products that contain aspirin, acetaminophen, ibuprofen, naproxen, or ketoprofen unless instructed by your doctor. These medicines may hide a fever. Do not become pregnant while taking this medicine. Women should inform their doctor if they wish to become pregnant or think they might be pregnant. There is a potential for serious side effects to an unborn child. Talk to your health care professional or pharmacist for more information. Do not breast-feed an infant while taking this medicine. Men are advised not to father  a child while receiving this medicine. This product may contain alcohol. Ask your pharmacist or healthcare provider if this medicine contains alcohol. Be sure to tell all healthcare providers you are taking this medicine. Certain medicines, like metronidazole and disulfiram, can cause an unpleasant reaction when taken with alcohol. The reaction includes flushing, headache, nausea, vomiting, sweating, and increased  thirst. The reaction can last from 30 minutes to several hours. What side effects may I notice from receiving this medicine? Side effects that you should report to your doctor or health care professional as soon as possible: -allergic reactions like skin rash, itching or hives, swelling of the face, lips, or tongue -low blood counts - This drug may decrease the number of white blood cells, red blood cells and platelets. You may be at increased risk for infections and bleeding. -signs of infection - fever or chills, cough, sore throat, pain or difficulty passing urine -signs of decreased platelets or bleeding - bruising, pinpoint red spots on the skin, black, tarry stools, nosebleeds -signs of decreased red blood cells - unusually weak or tired, fainting spells, lightheadedness -breathing problems -chest pain -high or low blood pressure -mouth sores -nausea and vomiting -pain, swelling, redness or irritation at the injection site -pain, tingling, numbness in the hands or feet -slow or irregular heartbeat -swelling of the ankle, feet, hands Side effects that usually do not require medical attention (report to your doctor or health care professional if they continue or are bothersome): -bone pain -complete hair loss including hair on your head, underarms, pubic hair, eyebrows, and eyelashes -changes in the color of fingernails -diarrhea -loosening of the fingernails -loss of appetite -muscle or joint pain -red flush to skin -sweating This list may not describe all possible side effects. Call your doctor for medical advice about side effects. You may report side effects to FDA at 1-800-FDA-1088. Where should I keep my medicine? This drug is given in a hospital or clinic and will not be stored at home. NOTE: This sheet is a summary. It may not cover all possible information. If you have questions about this medicine, talk to your doctor, pharmacist, or health care provider.  2018  Elsevier/Gold Standard (2015-07-02 19:58:00)

## 2018-01-12 ENCOUNTER — Encounter (HOSPITAL_COMMUNITY): Payer: Self-pay

## 2018-01-12 ENCOUNTER — Ambulatory Visit (HOSPITAL_COMMUNITY)
Admission: RE | Admit: 2018-01-12 | Discharge: 2018-01-12 | Disposition: A | Discharge status: Home / Self Care | Payer: Medicaid Other | Source: Ambulatory Visit | Attending: Gynecologic Oncology | Admitting: Gynecologic Oncology

## 2018-01-12 DIAGNOSIS — K449 Diaphragmatic hernia without obstruction or gangrene: Secondary | ICD-10-CM | POA: Diagnosis not present

## 2018-01-12 DIAGNOSIS — C5700 Malignant neoplasm of unspecified fallopian tube: Secondary | ICD-10-CM | POA: Insufficient documentation

## 2018-01-12 DIAGNOSIS — K439 Ventral hernia without obstruction or gangrene: Secondary | ICD-10-CM | POA: Diagnosis not present

## 2018-01-12 MED ORDER — IOHEXOL 300 MG/ML  SOLN
100.0000 mL | Freq: Once | INTRAMUSCULAR | Status: AC | PRN
  Administered 2018-01-12: 100 mL via INTRAVENOUS

## 2018-01-14 ENCOUNTER — Telehealth: Payer: Self-pay | Admitting: *Deleted

## 2018-01-14 NOTE — Telephone Encounter (Signed)
Called patient to tell her the results of her CT Chest with Contrast.  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  Patient verbalized understanding.

## 2018-01-18 ENCOUNTER — Telehealth: Payer: Self-pay

## 2018-01-18 ENCOUNTER — Telehealth: Payer: Self-pay | Admitting: *Deleted

## 2018-01-18 ENCOUNTER — Telehealth: Payer: Self-pay | Admitting: Medical Oncology

## 2018-01-18 ENCOUNTER — Other Ambulatory Visit: Payer: Self-pay

## 2018-01-18 NOTE — Telephone Encounter (Signed)
Voicemail received requesting return call to [phone].  Report of "feeling weak and body not feeling great.  Remember booster shot April 30,2019 to boost my white blood cells.  Call as soon as possible to let me know what to do."    Message transferred to collaborative for further assistance and patient communication.

## 2018-01-18 NOTE — Telephone Encounter (Signed)
"   I  am getting worse . On April 30 they gave me a boost for my WBC . I am weak and my body is not doing well." Message previously sent by Roz to Dr [name] .

## 2018-01-18 NOTE — Telephone Encounter (Signed)
Called pt through interpreter line. Unable to reach patient. LVM with call back number to discuss symptoms.  [phone]- Pt called back and was able to speak to her regarding her symptoms, via interpreter line.  Pt states that she had been experiencing profound weakness, body aches, and also noticed numbness and tingling on both of her hands. They come and go, feels achy with pins/ needles at times. Pt reports that she had received first time neulasta shot last treatment and since then, have been feeling more fatigue than usual.   Suggested that pt take claritin po for a few days, to see if that helps and to increase her hydration status. Pt also reported of having mild to moderate diarrhea the last few days, as well as some infrequent indigestion. No fevers, chills, nausea, vomiting, or abdominal cramping.   Encouraged pt to check her temperature daily BID, try tylenol OTC to help with discomfort/pain, claritin to help with aching from neulasta, increase oral fluid intake, monitor diarrhea frequency and amount and s/s of infection or anemia. Advised that pt call by the end of this week to update office of how she is doing so we can arrange symptom management appt, if needed prior to the weekend. Pt very appreciative of information. No further questions at this time.

## 2018-01-20 ENCOUNTER — Telehealth: Payer: Self-pay

## 2018-01-20 NOTE — Telephone Encounter (Signed)
Through pt's interpreter, I returned her call regarding she was told to follow up from the last call, she wanted to update Dr [name] and previous nurse May on how she is doing. She reports her numbness is better.  The Claritin helped some.  The diarrhea is less often, like 1-2 times a day.  She is drinking water and eating well now. Denies fever. Reminded of Symptom management appt if needed but verbalized does not need at this point.  Her next appt is May 21 st. No other needs per pt at this time.

## 2018-01-21 NOTE — Telephone Encounter (Signed)
Informed Ms. Knoop's mother (per patient requset) that medicaid would not cover the tumor test and germline test.  However, through the laboratory, [facility], the blood test will be $0 OOP cost confirmed by the laboratory.  I emailed this insurance investigation to Ms. Findlay and her mother.  Ms. halcyon heck like to proceed with germline genetic testing, and we will get a blood sample at her next lab on 02/01/2018.

## 2018-01-24 ENCOUNTER — Telehealth: Payer: Self-pay

## 2018-01-24 ENCOUNTER — Inpatient Hospital Stay: Payer: Medicaid Other | Attending: Hematology and Oncology | Admitting: Medical

## 2018-01-24 VITALS — BP 132/73 | HR 85 | Temp 97.7°F | Resp 16 | Ht 61.0 in | Wt 156.0 lb

## 2018-01-24 DIAGNOSIS — E119 Type 2 diabetes mellitus without complications: Secondary | ICD-10-CM | POA: Diagnosis not present

## 2018-01-24 DIAGNOSIS — Z5112 Encounter for antineoplastic immunotherapy: Secondary | ICD-10-CM | POA: Insufficient documentation

## 2018-01-24 DIAGNOSIS — Z171 Estrogen receptor negative status [ER-]: Secondary | ICD-10-CM | POA: Diagnosis not present

## 2018-01-24 DIAGNOSIS — L089 Local infection of the skin and subcutaneous tissue, unspecified: Secondary | ICD-10-CM | POA: Insufficient documentation

## 2018-01-24 DIAGNOSIS — C5700 Malignant neoplasm of unspecified fallopian tube: Secondary | ICD-10-CM | POA: Diagnosis not present

## 2018-01-24 DIAGNOSIS — C50512 Malignant neoplasm of lower-outer quadrant of left female breast: Secondary | ICD-10-CM | POA: Insufficient documentation

## 2018-01-24 DIAGNOSIS — Z5111 Encounter for antineoplastic chemotherapy: Secondary | ICD-10-CM | POA: Insufficient documentation

## 2018-01-24 DIAGNOSIS — Z8249 Family history of ischemic heart disease and other diseases of the circulatory system: Secondary | ICD-10-CM

## 2018-01-24 DIAGNOSIS — F329 Major depressive disorder, single episode, unspecified: Secondary | ICD-10-CM | POA: Insufficient documentation

## 2018-01-24 MED ORDER — MUPIROCIN CALCIUM 2 % EX CREA: 1.0000 "application " | TOPICAL_CREAM | Freq: Four times a day (QID) | CUTANEOUS | 2 refills | Status: DC

## 2018-01-24 MED ORDER — DOXYCYCLINE HYCLATE 100 MG PO TABS: 100.0000 mg | ORAL_TABLET | Freq: Two times a day (BID) | ORAL | 0 refills | Status: DC

## 2018-01-24 NOTE — Progress Notes (Signed)
[facility]pt[facility] Progress Note   Erin [name] [id number] 12-12-1960 57 y.o.  Erin [name] is managed by [name]  Actively treated with chemotherapy: yes  Current Therapy: Carboplatin and paclitaxel with PEG filgrastim support  Last Treated: 01/11/2018 (cycle 1, day 2)  Assessment: Plan:    Skin pustule - Plan: doxycycline (VIBRA-TABS) 100 MG tablet, mupirocin cream (BACTROBAN) 2 %  Skin pustules, cysts, and ulcerations of the face: The patient reports that she has had a history of acne.  She was given a prescription for doxycycline 100 mg p.o. twice daily and Bactroban to be used topically 4 times daily.  She is to see Dr. [name] in follow-up on 02/01/2018.  She knows to follow-up sooner if her condition worsens.  There may need to be consideration for a biopsy with dermatology should this condition continue or worsen.  Please see After Visit Summary for patient specific instructions.  Future Appointments  Date Time Provider [place]  02/01/2018  8:00 AM [facility] LAB 3 [facility] None  02/01/2018  8:15 AM [facility] FLUSH NURSE 2 [facility] None  02/01/2018  8:45 AM [name], MD [facility] None  02/01/2018  9:45 AM [facility] A1 [facility] None  02/09/2018  1:30 PM Hayden Pedro, PA-C [facility] None  02/23/2018 10:00 AM [facility] ECHO 1-BUZZ [facility]  02/23/2018 11:00 AM Bensimhon, Shaune Pascal, MD [facility] None    No orders of the defined types were placed in this encounter.      Subjective:   Patient ID:  Erin [name] is a 57 y.o. (DOB 02/13/1961) female.  Chief Complaint:  Chief Complaint  Patient presents with  . 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  She is managed by Dr. [name] and has most recently been treated with cycle 1, day 1 of carboplatin and paclitaxel which  was dosed on 01/11/2018.  She has a history of acne.  She reports having developed multiple lesions over her face over the past 7 days.  2 of these lesions are in the skin over the zygomatic bone immediately anterior to the ears.  The day or at an area that her glasses touch.  Additionally there or multiple small cystic appearing lesions over the chin and an area immediately inferior to the left eye.  There is a large open wound and the forehead where the patient reports having had a pustule which she manipulated until it drained.  It is now developed into a crater-like lesion.  She denies fevers, chills, sweats and has had no changes in any medications other than her new chemotherapy.  The patient is assisted today by a sign language interpreter.  Medications: I have reviewed the patient's current medications.  Allergies: No Known Allergies  Past Medical History:  Diagnosis Date  . Anemia   . Arthritis    knees, elbows  . Breast cancer ([place]) 05/2017   left  . Cough 06/03/2017  . Deaf   . Depression   . Dyspnea    states some SOB with ADLs and aanemia   . Family history of breast cancer   . Family history of breast cancer   . Family history of colon cancer   . High cholesterol   . History of blood transfusion 07/2017   [facility]  . Hypertension   . Non-insulin dependent type  2 diabetes mellitus ([place])   . Runny nose 06/03/2017   clear drainage, per pt.  . SVD (spontaneous vaginal delivery)    x 2    Past Surgical History:  Procedure Laterality Date  . BREAST LUMPECTOMY WITH RADIOACTIVE SEED AND SENTINEL LYMPH NODE BIOPSY Left 06/09/2017   Procedure: BREAST LUMPECTOMY WITH RADIOACTIVE SEED AND SENTINEL LYMPH NODE BIOPSY;  Surgeon: Excell Seltzer, MD;  Location: [facility];  Service: General;  Laterality: Left;  . COLONOSCOPY  11/02/2017   polyps  . LAPAROSCOPIC ASSISTED VAGINAL HYSTERECTOMY N/A 11/04/2017   Procedure: LAPAROSCOPIC ASSISTED VAGINAL HYSTERECTOMY;   Surgeon: [name], MD;  Location: [facility];  Service: Gynecology;  Laterality: N/A;  . LAPAROSCOPIC BILATERAL SALPINGO OOPHERECTOMY Bilateral 11/04/2017   Procedure: LAPAROSCOPIC BILATERAL SALPINGO OOPHORECTOMY;  Surgeon: [name], MD;  Location: [facility];  Service: Gynecology;  Laterality: Bilateral;  . MULTIPLE TOOTH EXTRACTIONS    . PORTACATH PLACEMENT Right 06/09/2017   Procedure: INSERTION PORT-A-CATH WITH [place];  Surgeon: Excell Seltzer, MD;  Location: [facility];  Service: General;  Laterality: Right;  . TUBAL LIGATION  02/04/2002  . UPPER GI ENDOSCOPY      Family History  Problem Relation Age of Onset  . Hypertension Mother   . Diabetes Father   . Hypertension Sister   . Diabetes Paternal Grandmother   . Colon cancer Paternal Grandmother 49  . CAD Brother   . Diabetes Brother   . Breast cancer Maternal Aunt        dx >50  . Heart attack Paternal Grandfather   . Breast cancer Maternal Aunt        dx under 9  . Breast cancer Maternal Aunt        dx  under 29    Social History   Socioeconomic History  . Marital status: Legally Separated    Spouse name: Not on file  . Number of children: Not on file  . Years of education: Not on file  . Highest education level: Not on file  Occupational History  . Not on file  Social Needs  . Financial resource strain: Not on file  . Food insecurity:    Worry: Not on file    Inability: Not on file  . Transportation needs:    Medical: Not on file    Non-medical: Not on file  Tobacco Use  . Smoking status: Never Smoker  . Smokeless tobacco: Never Used  Substance and Sexual Activity  . Alcohol use: No  . Drug use: No  . Sexual activity: Never    Birth control/protection: None  Lifestyle  . Physical activity:    Days per week: Not on file    Minutes per session: Not on file  . Stress: Not on file  Relationships  . Social connections:    Talks on phone: Not on file    Gets together: Not on file     Attends religious service: Not on file    Active member of club or organization: Not on file    Attends meetings of clubs or organizations: Not on file    Relationship status: Not on file  . Intimate partner violence:    Fear of current or ex partner: Not on file    Emotionally abused: Not on file    Physically abused: Not on file    Forced sexual activity: Not on file  Other Topics Concern  . Not on file  Social History Narrative  1 boy and 1 girl   Resides in [place]   Seperated    Past Medical History, Surgical history, Social history, and Family history were reviewed and updated as appropriate.   Please see review of systems for further details on the patient's review from today.   Review of Systems:  Review of Systems  Constitutional: Negative for diaphoresis.  HENT: Negative for sinus pressure, sinus pain and trouble swallowing.   Respiratory: Negative for cough, choking, chest tightness, shortness of breath and wheezing.   Cardiovascular: Negative for chest pain and palpitations.  Gastrointestinal: Negative for nausea and vomiting.  Musculoskeletal: Negative for back pain.  Skin: Positive for rash. Negative for color change.  Neurological: Negative for dizziness, light-headedness and headaches.    Objective:   Physical Exam:  BP 132/73 (Patient Position: Sitting)   Pulse 85   Temp 97.7 F (36.5 C) (Oral)   Resp 16   Ht 5\' 1"  (1.549 m)   Wt 156 lb (70.8 kg)   LMP 07/09/2017 (LMP Unknown)   SpO2 99%   BMI 29.48 kg/m  ECOG: 0  Physical Exam  Constitutional: No distress.  HENT:  Head: Normocephalic and atraumatic.  Cardiovascular: Normal rate, regular rhythm and normal heart sounds. Exam reveals no gallop and no friction rub.  No murmur heard. Pulmonary/Chest: Effort normal and breath sounds normal. No respiratory distress. She has no wheezes. She has no rales.  Neurological: She is alert.  Skin: Rash noted. She is not diaphoretic.  There are 2  crusting lesions in the skin overlying the zygomatic bone immediately anterior to the ears.  There is a cystic-like mass that is erythematous and is immediately inferior to the left eye.  There are multiple cystic-like lesions over the chin.  There is a large moist crater-like lesion in the mid forehead.  There are multiple areas of scarring of the skin on the face and neck.               Lab Review:     Component Value Date/Time   NA 140 01/11/2018 0835   NA 139 09/15/2017 0800   K 4.1 01/11/2018 0835   K 3.8 09/15/2017 0800   CL 107 01/11/2018 0835   CO2 25 01/11/2018 0835   CO2 23 09/15/2017 0800   GLUCOSE 111 01/11/2018 0835   GLUCOSE 113 09/15/2017 0800   BUN 12 01/11/2018 0835   BUN 8.7 09/15/2017 0800   CREATININE 0.72 01/11/2018 0835   CREATININE 0.7 09/15/2017 0800   CALCIUM 9.7 01/11/2018 0835   CALCIUM 9.2 09/15/2017 0800   PROT 7.7 01/11/2018 0835   PROT 7.3 09/15/2017 0800   ALBUMIN 3.7 01/11/2018 0835   ALBUMIN 3.3 (L) 09/15/2017 0800   AST 16 01/11/2018 0835   AST 13 09/15/2017 0800   ALT 23 01/11/2018 0835   ALT 19 09/15/2017 0800   ALKPHOS 85 01/11/2018 0835   ALKPHOS 74 09/15/2017 0800   BILITOT <0.2 (L) 01/11/2018 0835   BILITOT 0.27 09/15/2017 0800   GFRNONAA >60 01/11/2018 0835   GFRAA >60 01/11/2018 0835       Component Value Date/Time   WBC 8.5 01/11/2018 0835   WBC 17.7 (H) 11/05/2017 0503   RBC 3.70 01/11/2018 0835   HGB 11.2 (L) 01/11/2018 0835   HGB 9.3 (L) 09/15/2017 0800   HCT 35.2 01/11/2018 0835   HCT 28.2 (L) 09/15/2017 0800   PLT 239 01/11/2018 0835   PLT 357 09/15/2017 0800   PLT 375 05/26/2017 6160  MCV 95.1 01/11/2018 0835   MCV 82.4 09/15/2017 0800   MCH 30.3 01/11/2018 0835   MCHC 31.8 01/11/2018 0835   RDW 14.5 01/11/2018 0835   RDW 20.8 (H) 09/15/2017 0800   LYMPHSABS 1.7 01/11/2018 0835   LYMPHSABS 2.5 09/15/2017 0800   MONOABS 0.8 01/11/2018 0835   MONOABS 0.8 09/15/2017 0800   EOSABS 0.1 01/11/2018 0835     EOSABS 0.1 09/15/2017 0800   EOSABS 0.1 05/26/2017 0918   BASOSABS 0.0 01/11/2018 0835   BASOSABS 0.0 09/15/2017 0800   -------------------------------  Imaging from last 24 hours (if applicable):  Radiology interpretation: Ct Chest W Contrast  Result Date: 01/12/2018 CLINICAL DATA:  Recently diagnosed fallopian tube carcinoma. Recent surgery. Staging. Personal history of left breast carcinoma. EXAM: CT CHEST, ABDOMEN, AND PELVIS WITH CONTRAST TECHNIQUE: Multidetector CT imaging of the chest, abdomen and pelvis was performed following the standard protocol during bolus administration of intravenous contrast. CONTRAST:  185mL OMNIPAQUE IOHEXOL 300 MG/ML  SOLN COMPARISON:  None. FINDINGS: CT CHEST FINDINGS Cardiovascular: No acute findings. Mediastinum/Lymph Nodes: No masses or pathologically enlarged lymph nodes identified. Lungs/Pleura: No pulmonary infiltrate or mass identified. No effusion present. Musculoskeletal:  No suspicious bone lesions identified. CT ABDOMEN AND PELVIS FINDINGS Hepatobiliary: No masses identified. Gallbladder is unremarkable. Pancreas:  No mass or inflammatory changes. Spleen:  Within normal limits in size and appearance. Adrenals/Urinary tract:  No masses or hydronephrosis. Stomach/Bowel: Tiny hiatal hernia. No evidence of obstruction, inflammatory process, or abnormal fluid collections. Normal appendix visualized. Vascular/Lymphatic: No pathologically enlarged lymph nodes identified. No abdominal aortic aneurysm. Reproductive: Prior hysterectomy noted. Adnexal regions are unremarkable in appearance. No evidence of peritoneal thickening, nodularity, or ascites. Other:  Small supraumbilical ventral hernia containing only fat. Musculoskeletal:  No suspicious bone lesions identified. IMPRESSION: No evidence of residual carcinoma or metastatic disease. No other acute findings. Tiny hiatal hernia, and small ventral hernia containing only fat. Electronically Signed   By: Earle Gell M.D.   On: 01/12/2018 14:58   Ct Abdomen Pelvis W Contrast  Result Date: 01/12/2018 CLINICAL DATA:  Recently diagnosed fallopian tube carcinoma. Recent surgery. Staging. Personal history of left breast carcinoma. EXAM: CT CHEST, ABDOMEN, AND PELVIS WITH CONTRAST TECHNIQUE: Multidetector CT imaging of the chest, abdomen and pelvis was performed following the standard protocol during bolus administration of intravenous contrast. CONTRAST:  164mL OMNIPAQUE IOHEXOL 300 MG/ML  SOLN COMPARISON:  None. FINDINGS: CT CHEST FINDINGS Cardiovascular: No acute findings. Mediastinum/Lymph Nodes: No masses or pathologically enlarged lymph nodes identified. Lungs/Pleura: No pulmonary infiltrate or mass identified. No effusion present. Musculoskeletal:  No suspicious bone lesions identified. CT ABDOMEN AND PELVIS FINDINGS Hepatobiliary: No masses identified. Gallbladder is unremarkable. Pancreas:  No mass or inflammatory changes. Spleen:  Within normal limits in size and appearance. Adrenals/Urinary tract:  No masses or hydronephrosis. Stomach/Bowel: Tiny hiatal hernia. No evidence of obstruction, inflammatory process, or abnormal fluid collections. Normal appendix visualized. Vascular/Lymphatic: No pathologically enlarged lymph nodes identified. No abdominal aortic aneurysm. Reproductive: Prior hysterectomy noted. Adnexal regions are unremarkable in appearance. No evidence of peritoneal thickening, nodularity, or ascites. Other:  Small supraumbilical ventral hernia containing only fat. Musculoskeletal:  No suspicious bone lesions identified. IMPRESSION: No evidence of residual carcinoma or metastatic disease. No other acute findings. Tiny hiatal hernia, and small ventral hernia containing only fat. Electronically Signed   By: Earle Gell M.D.   On: 01/12/2018 14:58        A sign language interpreter from [facility] was used for the  patient's visit today.

## 2018-01-24 NOTE — Telephone Encounter (Signed)
RN received call from patient's mother stating "My daughter has ulcers coming up on her face and one of them is open." Mother has not taken fever. Denies cough, SOB, or chills. Reports bloody drainage from open lesion. Symptom Management appointment requested

## 2018-01-26 ENCOUNTER — Other Ambulatory Visit: Payer: Self-pay

## 2018-01-26 DIAGNOSIS — I10 Essential (primary) hypertension: Secondary | ICD-10-CM

## 2018-01-26 MED ORDER — LISINOPRIL 10 MG PO TABS: 10.0000 mg | ORAL_TABLET | Freq: Every day | ORAL | 1 refills | Status: DC

## 2018-01-26 NOTE — Progress Notes (Signed)
[facility] fax requesting refill pt. Lisinopril 10mg  tablet. Rx sent.

## 2018-01-27 NOTE — Progress Notes (Signed)
  Radiation Oncology         ([phone]) [phone] ________________________________  Name: Erin [name] MRN: [id number]  Date: 01/10/2018  DOB: 1961/07/13  End of Treatment Note  Diagnosis:   57 y.o. with Stage IA, [id number] grade 3, ER/PR negative, HER2 amplified, invasive ductal carcinoma with DCIS of the left breast  Indication for treatment:  Curative       Radiation treatment dates:   11/25/2017 - 01/10/2018  Site/dose:   The patient initially received a dose of 50.4 Gy in 28 fractions to the breast using whole-breast tangent fields. This was delivered using a 3-D conformal technique. The patient then received a boost to the seroma. This delivered an additional 10 Gy in 5 fractions using a 3-D technique. The total dose was 60.4 Gy.  Narrative: The patient tolerated radiation treatment relatively well.   The patient had some expected skin irritation as she progressed during treatment. Moist desquamation was not present at the end of treatment.  Plan: The patient has completed radiation treatment. The patient will return to radiation oncology clinic for routine followup in one month. I advised the patient to call or return sooner if they have any questions or concerns related to their recovery or treatment. ________________________________  Jodelle Gross, MD, PhD  This document serves as a record of services personally performed by Kyung Rudd, MD. It was created on his behalf by Rae Lips, a trained medical scribe. The creation of this record is based on the scribe's personal observations and the provider's statements to them. This document has been checked and approved by the attending provider.

## 2018-01-31 ENCOUNTER — Telehealth: Payer: Self-pay | Admitting: *Deleted

## 2018-01-31 NOTE — Telephone Encounter (Signed)
Pt left voice mail message on 5/16 @ 1423 stating that she had a bill ($2800+) denied by Medicaid. She has called them and was told that there was supposed to be a pre-surgery consent sent in. She needs this form faxed to medicaid so they will cover the bill. Please call back. Pt is deaf and her message was left using Video relay interpreter [phone].

## 2018-02-01 ENCOUNTER — Encounter: Payer: Self-pay | Admitting: Hematology and Oncology

## 2018-02-01 ENCOUNTER — Inpatient Hospital Stay: Payer: Medicaid Other

## 2018-02-01 ENCOUNTER — Telehealth: Payer: Self-pay | Admitting: Hematology and Oncology

## 2018-02-01 ENCOUNTER — Inpatient Hospital Stay (HOSPITAL_BASED_OUTPATIENT_CLINIC_OR_DEPARTMENT_OTHER): Payer: Medicaid Other | Admitting: Hematology and Oncology

## 2018-02-01 VITALS — BP 113/72 | HR 90 | Temp 97.7°F | Resp 18 | Ht 61.0 in | Wt 156.5 lb

## 2018-02-01 DIAGNOSIS — C5701 Malignant neoplasm of right fallopian tube: Secondary | ICD-10-CM | POA: Diagnosis not present

## 2018-02-01 DIAGNOSIS — C50512 Malignant neoplasm of lower-outer quadrant of left female breast: Secondary | ICD-10-CM | POA: Diagnosis not present

## 2018-02-01 DIAGNOSIS — Z95828 Presence of other vascular implants and grafts: Secondary | ICD-10-CM

## 2018-02-01 DIAGNOSIS — Z171 Estrogen receptor negative status [ER-]: Secondary | ICD-10-CM

## 2018-02-01 DIAGNOSIS — Z5112 Encounter for antineoplastic immunotherapy: Secondary | ICD-10-CM | POA: Diagnosis not present

## 2018-02-01 LAB — CBC WITH DIFFERENTIAL (CANCER CENTER ONLY)
BASOS ABS: 0 10*3/uL (ref 0.0–0.1)
BASOS PCT: 0 %
Eosinophils Absolute: 0.1 10*3/uL (ref 0.0–0.5)
Eosinophils Relative: 1 %
HEMATOCRIT: 32.7 % — AB (ref 34.8–46.6)
Hemoglobin: 11.1 g/dL — ABNORMAL LOW (ref 11.6–15.9)
LYMPHS PCT: 20 %
Lymphs Abs: 1.8 10*3/uL (ref 0.9–3.3)
MCH: 31.3 pg (ref 25.1–34.0)
MCHC: 34.1 g/dL (ref 31.5–36.0)
MCV: 91.9 fL (ref 79.5–101.0)
MONO ABS: 0.9 10*3/uL (ref 0.1–0.9)
Monocytes Relative: 10 %
NEUTROS ABS: 6.3 10*3/uL (ref 1.5–6.5)
NEUTROS PCT: 69 %
Platelet Count: 288 10*3/uL (ref 145–400)
RBC: 3.56 MIL/uL — ABNORMAL LOW (ref 3.70–5.45)
RDW: 14.6 % — AB (ref 11.2–14.5)
WBC: 9 10*3/uL (ref 3.9–10.3)

## 2018-02-01 LAB — CMP (CANCER CENTER ONLY)
ALT: 21 U/L (ref 0–55)
AST: 14 U/L (ref 5–34)
Albumin: 3.7 g/dL (ref 3.5–5.0)
Alkaline Phosphatase: 93 U/L (ref 40–150)
Anion gap: 9 (ref 3–11)
BUN: 10 mg/dL (ref 7–26)
CO2: 24 mmol/L (ref 22–29)
Calcium: 9.7 mg/dL (ref 8.4–10.4)
Chloride: 106 mmol/L (ref 98–109)
Creatinine: 0.7 mg/dL (ref 0.60–1.10)
Glucose, Bld: 131 mg/dL (ref 70–140)
POTASSIUM: 4.1 mmol/L (ref 3.5–5.1)
Sodium: 139 mmol/L (ref 136–145)
TOTAL PROTEIN: 7.7 g/dL (ref 6.4–8.3)

## 2018-02-01 MED ORDER — DIPHENHYDRAMINE HCL 50 MG/ML IJ SOLN
50.0000 mg | Freq: Once | INTRAMUSCULAR | Status: AC
  Administered 2018-02-01: 50 mg via INTRAVENOUS

## 2018-02-01 MED ORDER — HEPARIN SOD (PORK) LOCK FLUSH 100 UNIT/ML IV SOLN
500.0000 [IU] | Freq: Once | INTRAVENOUS | Status: AC | PRN
  Administered 2018-02-01: 500 [IU]
  Filled 2018-02-01: qty 5

## 2018-02-01 MED ORDER — SODIUM CHLORIDE 0.9 % IV SOLN
575.0000 mg | Freq: Once | INTRAVENOUS | Status: AC
  Administered 2018-02-01: 580 mg via INTRAVENOUS
  Filled 2018-02-01: qty 58

## 2018-02-01 MED ORDER — SODIUM CHLORIDE 0.9% FLUSH
10.0000 mL | INTRAVENOUS | Status: DC | PRN
  Administered 2018-02-01: 10 mL
  Filled 2018-02-01: qty 10

## 2018-02-01 MED ORDER — FAMOTIDINE IN NACL 20-0.9 MG/50ML-% IV SOLN: 20.0000 mg | Freq: Once | INTRAVENOUS | Status: DC

## 2018-02-01 MED ORDER — ACETAMINOPHEN 325 MG PO TABS
ORAL_TABLET | ORAL | Status: AC
  Filled 2018-02-01: qty 2

## 2018-02-01 MED ORDER — PALONOSETRON HCL INJECTION 0.25 MG/5ML
INTRAVENOUS | Status: AC
  Filled 2018-02-01: qty 5

## 2018-02-01 MED ORDER — SODIUM CHLORIDE 0.9% FLUSH
10.0000 mL | INTRAVENOUS | Status: DC | PRN
  Administered 2018-02-01: 10 mL via INTRAVENOUS
  Filled 2018-02-01: qty 10

## 2018-02-01 MED ORDER — PALONOSETRON HCL INJECTION 0.25 MG/5ML
0.2500 mg | Freq: Once | INTRAVENOUS | Status: AC
  Administered 2018-02-01: 0.25 mg via INTRAVENOUS

## 2018-02-01 MED ORDER — DIPHENHYDRAMINE HCL 50 MG/ML IJ SOLN: 25.0000 mg | Freq: Once | INTRAMUSCULAR | Status: DC

## 2018-02-01 MED ORDER — DIPHENHYDRAMINE HCL 50 MG/ML IJ SOLN
INTRAMUSCULAR | Status: AC
  Filled 2018-02-01: qty 1

## 2018-02-01 MED ORDER — ACETAMINOPHEN 325 MG PO TABS
650.0000 mg | ORAL_TABLET | Freq: Once | ORAL | Status: AC
  Administered 2018-02-01: 650 mg via ORAL

## 2018-02-01 MED ORDER — SODIUM CHLORIDE 0.9 % IV SOLN
Freq: Once | INTRAVENOUS | Status: AC
  Administered 2018-02-01: 10:00:00 via INTRAVENOUS
  Filled 2018-02-01: qty 5

## 2018-02-01 MED ORDER — SODIUM CHLORIDE 0.9 % IV SOLN
Freq: Once | INTRAVENOUS | Status: AC
  Administered 2018-02-01: 10:00:00 via INTRAVENOUS

## 2018-02-01 MED ORDER — TRASTUZUMAB CHEMO 150 MG IV SOLR
450.0000 mg | Freq: Once | INTRAVENOUS | Status: AC
  Administered 2018-02-01: 450 mg via INTRAVENOUS
  Filled 2018-02-01: qty 21.43

## 2018-02-01 NOTE — Assessment & Plan Note (Addendum)
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 Denman George recommends Taxol and carboplatin.  We decided to administer 3 doses of Taxol and carboplatin  Current Treatment: Taxol and carboplatin added to Herceptin treatment every 3 weeks.  Chemo toxicities: 1.  Severe rash on the face and chest as well as a scalp: We discontinued Taxol from cycle 2 2. hair loss and bone pain from Neulasta Since we are discontinuing Taxol, she will not need Neulasta.  Return to clinic in 3 weeks for the last and final cycle of chemotherapy and after which she will get Herceptin every 3 weeks

## 2018-02-01 NOTE — Telephone Encounter (Signed)
Gave avs and calendar ° °

## 2018-02-01 NOTE — Progress Notes (Signed)
Erin [name]:    Erin Pounds, NP [place] [address]   DIAGNOSIS: Cancer Staging Fallopian tube cancer, carcinoma, right [facility]) Staging form: Ovary, Fallopian Tube, and Primary Peritoneal Carcinoma, AJCC 8th Edition - Clinical stage from 11/04/2017: FIGO Stage I, calculated as Stage Unknown (cT1c2, cNX, cM0) - Signed by Erin Amber, MD on 11/22/2017  Malignant neoplasm of lower-outer quadrant of left breast of female, estrogen receptor negative ([place]) Staging form: Breast, AJCC 8th Edition - Clinical stage from 05/18/2017: Stage IA (cT1b, cN0, cM0, G3, ER: Negative, PR: Negative, HER2: Positive) - Unsigned - Pathologic: Stage IA (pT1c, pN0, cM0, G3, ER: Negative, PR: Negative, HER2: Positive) - Signed by Erin Phlegm, NP on 07/07/2017   SUMMARY OF ONCOLOGIC HISTORY:   Malignant neoplasm of lower-outer quadrant of left breast of female, estrogen receptor negative ([place])   05/11/2017 Initial Diagnosis    Left breast biopsy 3:30 position 6 cm from nipple: IDC with DCIS, lymphovascular invasion present, grade 2-3, ER 0%, PR 0%, HER-2 positive ratio 2.28, Ki-67 20%, 1 cm lesion left breast, T1b N0 stage IA clinical stage      06/09/2017 Surgery    Left lumpectomy: IDC grade 3, 1.2 cm, DCIS is present, margins negative, 0/6 lymph nodes negative, ER 0%, PR 0%, HER-2 positive ratio 2.28, Ki-67 20%, T1 BN 0 stage IA      07/09/2017 -  Adjuvant Chemotherapy    Taxol/Herceptin completed on 09/29/2017, went on to receive every 3 week adjuvant Herceptin on 10/20/2017      12/21/2017 -  Chemotherapy    The patient had palonosetron (ALOXI) injection 0.25 mg, 0.25 mg, Intravenous,  Once, 2 of 3 cycles Administration: 0.25 mg (01/11/2018) pegfilgrastim (NEULASTA ONPRO KIT) injection 6 mg, 6 mg, Subcutaneous, Once, 1 of 1 cycle Administration: 6 mg (01/11/2018) CARBOplatin (PARAPLATIN) 580 mg in sodium chloride 0.9 % 250 mL chemo infusion, 580 mg  (100 % of original dose 575 mg), Intravenous,  Once, 2 of 3 cycles Dose modification:   (original dose 575 mg, Cycle 1) Administration: 580 mg (01/11/2018) PACLitaxel (TAXOL) 306 mg in sodium chloride 0.9 % 500 mL chemo infusion (> '80mg'$ /m2), 175 mg/m2 = 306 mg, Intravenous,  Once, 1 of 1 cycle Administration: 306 mg (01/11/2018) fosaprepitant (EMEND) 150 mg, dexamethasone (DECADRON) 12 mg in sodium chloride 0.9 % 145 mL IVPB, , Intravenous,  Once, 2 of 3 cycles Administration:  (01/11/2018)  for chemotherapy treatment.        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 female returns for evaluation prior to receiving chemotherapy today.  She is experiencing an odd sensation on her feet.  She denies numbness/tingling in her feet or toes.  Left hand and fingertips are normal.  Her right hand has some consistent numbness and tingling.  It is in all of the fingertips.  She says it encases her entire hand.  She denies motor deficits.  She has a h/o issues with her right wrist previously, and she does note soreness in her right  wrist from time to time.    She doesn't have any mouth sores.  She is eating well.  Her scalp is sore.  She has also had sores on her face and head, they are improving after taking Doxycycline for it.  She denies nausea, vomiting.     Patient Active Problem List   Diagnosis Date Noted  . Family history of breast cancer   . Family history of colon cancer   . Fallopian tube cancer, carcinoma, right ([place]) 11/22/2017  . S/P vaginal hysterectomy 11/04/2017  . Ovarian  mass, left 08/26/2017  . Menorrhagia 08/26/2017  . Port-A-Cath in place 07/23/2017  . Malignant neoplasm of lower-outer quadrant of left breast of female, estrogen receptor negative ([place]) 05/13/2017  . Abdominal bloating 04/02/2017  . Chronic right-sided low back pain without sciatica 07/02/2016  . DM type 2 (diabetes mellitus, type 2) ([place]) 11/18/2014  . Intermittent constipation 06/06/2010  . UNSPECIFIED IRON DEFICIENCY ANEMIA 04/22/2010  . DEPRESSION 04/10/2010  . DEAFNESS 04/10/2010    has No Known Allergies.  MEDICAL HISTORY: Past Medical History:  Diagnosis Date  . Anemia   . Arthritis    knees, elbows  . Breast cancer ([place]) 05/2017   left  . Cough 06/03/2017  . Deaf   . Depression   . Dyspnea    states some SOB with ADLs and aanemia   . Family history of breast cancer   . Family history of breast cancer   . Family history of colon cancer   . High cholesterol   . History of blood transfusion 07/2017   [facility]  . Hypertension   . Non-insulin dependent type 2 diabetes mellitus ([place])   . Runny nose 06/03/2017   clear drainage, per pt.  . SVD (spontaneous vaginal delivery)    x 2    SURGICAL HISTORY: Past Surgical History:  Procedure Laterality Date  . BREAST LUMPECTOMY WITH RADIOACTIVE SEED AND SENTINEL LYMPH NODE BIOPSY Left 06/09/2017   Procedure: BREAST LUMPECTOMY WITH RADIOACTIVE SEED AND SENTINEL LYMPH NODE BIOPSY;  Surgeon: Excell Seltzer, MD;  Location: [facility];  Service: General;  Laterality: Left;  . COLONOSCOPY  11/02/2017   polyps  . LAPAROSCOPIC ASSISTED VAGINAL HYSTERECTOMY N/A 11/04/2017   Procedure: LAPAROSCOPIC ASSISTED VAGINAL HYSTERECTOMY;  Surgeon: [name], MD;  Location: [facility];  Service: Gynecology;  Laterality: N/A;  . LAPAROSCOPIC BILATERAL SALPINGO OOPHERECTOMY Bilateral 11/04/2017   Procedure: LAPAROSCOPIC BILATERAL SALPINGO OOPHORECTOMY;  Surgeon: [name], MD;  Location: [facility];  Service: Gynecology;   Laterality: Bilateral;  . MULTIPLE TOOTH EXTRACTIONS    . PORTACATH PLACEMENT Right 06/09/2017   Procedure: INSERTION PORT-A-CATH WITH [place];  Surgeon: Excell Seltzer, MD;  Location: [facility];  Service: General;  Laterality: Right;  . TUBAL LIGATION  02/04/2002  . UPPER GI ENDOSCOPY      SOCIAL HISTORY: Social History   Socioeconomic History  . Marital status: Legally Separated    Spouse name: Not on file  . Number of children: Not on file  . Years of education: Not on file  . Highest education level: Not on file  Occupational History  . Not on file  Social Needs  . Financial resource strain: Not on file  . Food insecurity:    Worry: Not on file    Inability: Not on file  . Transportation needs:    Medical: Not on file    Non-medical: Not on file  Tobacco Use  . Smoking status: Never Smoker  .  Smokeless tobacco: Never Used  Substance and Sexual Activity  . Alcohol use: No  . Drug use: No  . Sexual activity: Never    Birth control/protection: None  Lifestyle  . Physical activity:    Days per week: Not on file    Minutes per session: Not on file  . Stress: Not on file  Relationships  . Social connections:    Talks on phone: Not on file    Gets together: Not on file    Attends religious service: Not on file    Active member of club or organization: Not on file    Attends meetings of clubs or organizations: Not on file    Relationship status: Not on file  . Intimate partner violence:    Fear of current or ex partner: Not on file    Emotionally abused: Not on file    Physically abused: Not on file    Forced sexual activity: Not on file  Other Topics Concern  . Not on file  Social History Narrative   1 boy and 1 girl   Resides in [place]: Family History  Problem Relation Age of Onset  . Hypertension Mother   . Diabetes Father   . Hypertension Sister   . Diabetes Paternal Grandmother   . Colon cancer  Paternal Grandmother 70  . CAD Brother   . Diabetes Brother   . Breast cancer Maternal Aunt        dx >50  . Heart attack Paternal Grandfather   . Breast cancer Maternal Aunt        dx under 15  . Breast cancer Maternal Aunt        dx  under 50    Review of Systems  Constitutional: Negative for appetite change, chills and fever.  HENT:   Positive for hearing loss (patient is deaf). Negative for mouth sores.   Eyes: Positive for eye problems (occ blurred vision).  Respiratory: Negative for chest tightness, cough and shortness of breath.   Cardiovascular: Negative for chest pain, leg swelling and palpitations.  Gastrointestinal: Negative for abdominal distention, constipation, diarrhea, nausea and vomiting.  Endocrine: Negative for hot flashes.  Neurological: Positive for numbness (see interval history). Negative for dizziness, extremity weakness and headaches.  Psychiatric/Behavioral: Negative for depression and suicidal ideas. The patient is not nervous/anxious.       PHYSICAL EXAMINATION  ECOG PERFORMANCE STATUS: 1 - Symptomatic but completely ambulatory  Vitals:   02/01/18 0822  BP: 113/72  Pulse: 90  Resp: 18  Temp: 97.7 F (36.5 C)  SpO2: 100%    Physical Exam  LABORATORY DATA:  CBC    Component Value Date/Time   WBC 9.0 02/01/2018 0756   WBC 17.7 (H) 11/05/2017 0503   RBC 3.56 (L) 02/01/2018 0756   HGB 11.1 (L) 02/01/2018 0756   HGB 9.3 (L) 09/15/2017 0800   HCT 32.7 (L) 02/01/2018 0756   HCT 28.2 (L) 09/15/2017 0800   PLT 288 02/01/2018 0756   PLT 357 09/15/2017 0800   PLT 375 05/26/2017 0918   MCV 91.9 02/01/2018 0756   MCV 82.4 09/15/2017 0800   MCH 31.3 02/01/2018 0756   MCHC 34.1 02/01/2018 0756   RDW 14.6 (H) 02/01/2018 0756   RDW 20.8 (H) 09/15/2017 0800   LYMPHSABS 1.8 02/01/2018 0756   LYMPHSABS 2.5 09/15/2017 0800   MONOABS 0.9 02/01/2018 0756   MONOABS 0.8 09/15/2017 0800   EOSABS 0.1 02/01/2018 0756  EOSABS 0.1 09/15/2017 0800    EOSABS 0.1 05/26/2017 0918   BASOSABS 0.0 02/01/2018 0756   BASOSABS 0.0 09/15/2017 0800    CMP     Component Value Date/Time   NA 139 02/01/2018 0756   NA 139 09/15/2017 0800   K 4.1 02/01/2018 0756   K 3.8 09/15/2017 0800   CL 106 02/01/2018 0756   CO2 24 02/01/2018 0756   CO2 23 09/15/2017 0800   GLUCOSE 131 02/01/2018 0756   GLUCOSE 113 09/15/2017 0800   BUN 10 02/01/2018 0756   BUN 8.7 09/15/2017 0800   CREATININE 0.70 02/01/2018 0756   CREATININE 0.7 09/15/2017 0800   CALCIUM 9.7 02/01/2018 0756   CALCIUM 9.2 09/15/2017 0800   PROT 7.7 02/01/2018 0756   PROT 7.3 09/15/2017 0800   ALBUMIN 3.7 02/01/2018 0756   ALBUMIN 3.3 (L) 09/15/2017 0800   AST 14 02/01/2018 0756   AST 13 09/15/2017 0800   ALT 21 02/01/2018 0756   ALT 19 09/15/2017 0800   ALKPHOS 93 02/01/2018 0756   ALKPHOS 74 09/15/2017 0800   BILITOT <0.2 (L) 02/01/2018 0756   BILITOT 0.27 09/15/2017 0800   GFRNONAA >60 02/01/2018 0756   GFRAA >60 02/01/2018 0756       PENDING LABS:   RADIOGRAPHIC STUDIES:  No results found.   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 Denman George recommends Taxol and carboplatin.  We decided to administer 3 doses of Taxol and carboplatin  Current Treatment: Taxol and carboplatin added to Herceptin treatment every 3 weeks.  Last echo on 11/18/2017: EF 55-60%, followed by cardiology.   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 Denman George recommends Taxol and carboplatin.  We decided  to administer 3 doses of Taxol and carboplatin  Current Treatment: Taxol and carboplatin added to Herceptin treatment every 3 weeks.  Chemo toxicities: 1.  Severe rash on the face and chest as well as a scalp: We discontinued Taxol from cycle 2 2. hair loss and bone pain from Neulasta Since we are discontinuing Taxol, she will not need Neulasta.  Return to clinic in 3 weeks for the last and final cycle of chemotherapy and after which she will get Herceptin every 3 weeks   No orders of the defined types were placed in this encounter.   All questions were answered. The patient knows to call the clinic with any problems, questions or concerns. We can certainly see the patient much sooner if necessary. This note was electronically signed. Harriette Ohara, MD 02/01/2018

## 2018-02-01 NOTE — Patient Instructions (Signed)
[facility] Discharge Instructions for Patients Receiving Chemotherapy  Today you received the following chemotherapy agents:  trastuzumab (Herceptin) and Carboplatin  To help prevent nausea and vomiting after your treatment, we encourage you to take your nausea medication as prescribed.   If you develop nausea and vomiting that is not controlled by your nausea medication, call the clinic.   BELOW ARE SYMPTOMS THAT SHOULD BE REPORTED IMMEDIATELY:  *FEVER GREATER THAN 100.5 F  *CHILLS WITH OR WITHOUT FEVER  NAUSEA AND VOMITING THAT IS NOT CONTROLLED WITH YOUR NAUSEA MEDICATION  *UNUSUAL SHORTNESS OF BREATH  *UNUSUAL BRUISING OR BLEEDING  TENDERNESS IN MOUTH AND THROAT WITH OR WITHOUT PRESENCE OF ULCERS  *URINARY PROBLEMS  *BOWEL PROBLEMS  UNUSUAL RASH Items with * indicate a potential emergency and should be followed up as soon as possible.  Feel free to call the clinic should you have any questions or concerns. The clinic phone number is [phone].  Please show the [facility] at check-in to the Emergency Department and triage nurse.

## 2018-02-01 NOTE — Assessment & Plan Note (Addendum)
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 Denman George recommends Taxol and carboplatin.  We decided to administer 3 doses of Taxol and carboplatin  Current Treatment: Taxol and carboplatin added to Herceptin treatment every 3 weeks.  Last echo on 11/18/2017: EF 55-60%, followed by cardiology.

## 2018-02-09 ENCOUNTER — Telehealth: Payer: Self-pay | Admitting: *Deleted

## 2018-02-09 ENCOUNTER — Ambulatory Visit: Admission: RE | Admit: 2018-02-09 | Payer: Medicare Other | Source: Ambulatory Visit | Admitting: Radiation Oncology

## 2018-02-09 NOTE — Telephone Encounter (Signed)
CALLED PATIENT TO RESCHEDULE FU FROM 02-09-18, RESCHEDULED FOR 03-02-18 @ 2:30 PM WITH ALISON PERKINS, SPOKE WITH PATIENT AND SHE AGREED TO THIS APPT. DATE AND TIME

## 2018-02-17 ENCOUNTER — Encounter: Payer: Self-pay | Admitting: Genetics

## 2018-02-17 ENCOUNTER — Telehealth: Payer: Self-pay | Admitting: Genetics

## 2018-02-17 ENCOUNTER — Other Ambulatory Visit: Payer: Self-pay | Admitting: Hematology and Oncology

## 2018-02-17 DIAGNOSIS — Z1379 Encounter for other screening for genetic and chromosomal anomalies: Secondary | ICD-10-CM

## 2018-02-17 DIAGNOSIS — Z1509 Genetic susceptibility to other malignant neoplasm: Secondary | ICD-10-CM

## 2018-02-17 DIAGNOSIS — Z853 Personal history of malignant neoplasm of breast: Secondary | ICD-10-CM

## 2018-02-17 DIAGNOSIS — Z1501 Genetic susceptibility to malignant neoplasm of breast: Secondary | ICD-10-CM

## 2018-02-17 HISTORY — DX: Encounter for other screening for genetic and chromosomal anomalies: Z13.79

## 2018-02-17 HISTORY — DX: Genetic susceptibility to malignant neoplasm of breast: Z15.01

## 2018-02-17 NOTE — Telephone Encounter (Signed)
Revealed positive BRCA2 mutation.  We discussed that this result is the likely explanation for her personal history of breast and fallopian tube cancer.  She had a complete hysterectomy to treat her fallopian tube carcinoma, and therefore no additional treatment is necessary for ovarian cancer risk.  However, we would recommend either high risk breast screening or bilateral mastectomy to either prevent or monitor for her remaining breast cancer risk.   We discussed that there are some other cancer risks associated with this gene including pancreatic, melanoma, prostate.   I briefly discussed that this mutation was probably inherited from one of her parents, therefore her other relatives including children, parents, siblings, aunts/uncles, cousins, etc. Are also at risk to have inherited this mutation.   We scheduled follow-up appointment on 6/19 at 3pm to discuss this finding further.  We invited Erin [name] to bring any relatives with her to this appointment is she wishes.    Video phone interpreter on patient's home phone was used for this conversation.

## 2018-02-21 NOTE — Progress Notes (Signed)
Patient Care Team: [name], NP as PCP - General (Nurse Practitioner) Excell Seltzer, MD as Consulting Physician (General Surgery) [name], MD as Consulting Physician (Hematology and Oncology) Kyung Rudd, MD as Consulting Physician (Radiation Oncology)  DIAGNOSIS:  Encounter Diagnoses  Name Primary?  . Malignant neoplasm of lower-outer quadrant of left breast of female, estrogen receptor negative ([place])   . Fallopian tube cancer, carcinoma, right ([place])   . Skin pustule     SUMMARY OF ONCOLOGIC HISTORY:   Malignant neoplasm of lower-outer quadrant of left breast of female, estrogen receptor negative ([place])   05/11/2017 Initial Diagnosis    Left breast biopsy 3:30 position 6 cm from nipple: IDC with DCIS, lymphovascular invasion present, grade 2-3, ER 0%, PR 0%, HER-2 positive ratio 2.28, Ki-67 20%, 1 cm lesion left breast, T1b N0 stage IA clinical stage      06/09/2017 Surgery    Left lumpectomy: IDC grade 3, 1.2 cm, DCIS is present, margins negative, 0/6 lymph nodes negative, ER 0%, PR 0%, HER-2 positive ratio 2.28, Ki-67 20%, T1 BN 0 stage IA      07/09/2017 -  Adjuvant Chemotherapy    Taxol/Herceptin completed on 09/29/2017, went on to receive every 3 week adjuvant Herceptin on 10/20/2017      12/21/2017 -  Chemotherapy    The patient had palonosetron (ALOXI) injection 0.25 mg, 0.25 mg, Intravenous,  Once, 2 of 3 cycles Administration: 0.25 mg (01/11/2018), 0.25 mg (02/01/2018) pegfilgrastim (NEULASTA ONPRO KIT) injection 6 mg, 6 mg, Subcutaneous, Once, 1 of 1 cycle Administration: 6 mg (01/11/2018) CARBOplatin (PARAPLATIN) 580 mg in sodium chloride 0.9 % 250 mL chemo infusion, 580 mg (100 % of original dose 575 mg), Intravenous,  Once, 2 of 3 cycles Dose modification:   (original dose 575 mg, Cycle 1) Administration: 580 mg (01/11/2018), 580 mg (02/01/2018) PACLitaxel (TAXOL) 306 mg in sodium chloride 0.9 % 500 mL chemo infusion (> 82m/m2), 175 mg/m2 = 306 mg,  Intravenous,  Once, 1 of 1 cycle Administration: 306 mg (01/11/2018) fosaprepitant (EMEND) 150 mg, dexamethasone (DECADRON) 12 mg in sodium chloride 0.9 % 145 mL IVPB, , Intravenous,  Once, 2 of 3 cycles Administration:  (01/11/2018),  (02/01/2018)  for chemotherapy treatment.       02/15/2018 Genetic Testing    The Common Hereditary Cancer Panel offered by [facility] includes sequencing and/or deletion duplication testing of the following 47 genes: APC, ATM, AXIN2, BARD1, BMPR1A, BRCA1, BRCA2, BRIP1, CDH1, CDKN2A (p14ARF), CDKN2A (p16INK4a), CKD4, CHEK2, CTNNA1, DICER1, EPCAM (Deletion/duplication testing only), GREM1 (promoter region deletion/duplication testing only), KIT, MEN1, MLH1, MSH2, MSH3, MSH6, MUTYH, NBN, NF1, NHTL1, PALB2, PDGFRA, PMS2, POLD1, POLE, PTEN, RAD50, RAD51C, RAD51D, SDHB, SDHC, SDHD, SMAD4, SMARCA4. STK11, TP53, TSC1, TSC2, and VHL.  The following genes were evaluated for sequence changes only: SDHA and HOXB13 c.251G>A variant only.   Results: POSITIVE. 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  Based on Dr. Alan Ripper recommendations we decided to go 3 cycles of Taxol and carboplatin.  Unfortunately she could not tolerate Taxol and was discontinued.  She will receive carboplatin and Herceptin today.  She has not had any  problems with the Herceptin.  Acneform rash on the face  REVIEW OF SYSTEMS:   Constitutional: Denies fevers, chills or abnormal weight loss Eyes: Denies blurriness of vision Ears, nose, mouth, throat, and face: Denies mucositis or sore throat Respiratory: Denies cough, dyspnea or wheezes Cardiovascular: Denies palpitation, chest discomfort Gastrointestinal:  Denies nausea, heartburn or change in bowel habits Skin: Acneform rash on the face and folliculitis on the scalp Lymphatics: Denies new lymphadenopathy or easy bruising Neurological:Denies numbness, tingling or new weaknesses Behavioral/Psych: Mood is stable, no new changes  Extremities: No lower extremity edema  All other systems were reviewed with the patient and are negative.  I have reviewed the past medical history, past surgical history, social history and family history with the patient and they are unchanged from previous note.  ALLERGIES:  has No Known Allergies.  MEDICATIONS:  Current Outpatient Medications  Medication Sig Dispense Refill  . atorvastatin (LIPITOR) 40 MG tablet Take 1 tablet (40 mg total) by mouth daily. 90 tablet 1  . Blood Glucose Monitoring Suppl (ACCU-CHEK AVIVA PLUS) W/DEVICE KIT Check Blood sugar AM fasting and before dinner 1 kit 0  . cholecalciferol (VITAMIN D) 1000 units tablet Take 1,000 Units by mouth daily.    Marland Kitchen doxycycline (VIBRA-TABS) 100 MG tablet Take 1 tablet (100 mg total) by mouth 2 (two) times daily. 20 tablet 0  . ferrous sulfate 325 (65 FE) MG tablet Take 1 tablet (325 mg total) by mouth daily before breakfast. 90 tablet 1  . glucose blood (ACCU-CHEK AVIVA) test strip Use as instructed 100 each 12  . Lancet Devices (ACCU-CHEK SOFTCLIX) lancets Use as instructed 1 each 0  . lidocaine-prilocaine (EMLA) cream Apply to affected area once 30 g 3  . lisinopril (PRINIVIL,ZESTRIL) 10 MG tablet Take 1 tablet (10 mg total) by mouth daily. 30 tablet 1  . metFORMIN (GLUCOPHAGE XR) 500 MG 24 hr  tablet Take 2 tablets (1,000 mg total) by mouth daily with breakfast. 180 tablet 0  . mupirocin cream (BACTROBAN) 2 % Apply 1 application topically 4 (four) times daily. (Patient not taking: Reported on 02/01/2018) 30 g 2  . Omega-3 Fatty Acids (FISH OIL OMEGA-3 PO) Take 1 capsule by mouth daily.     . TRUEPLUS LANCETS 28G MISC USE AS DIRECTED BY PHYSICIAN 100 each 0   Current Facility-Administered Medications  Medication Dose Route Frequency Provider Last Rate Last Dose  . 0.9 %  sodium chloride infusion  500 mL Intravenous Once Nandigam, Venia Minks, MD        PHYSICAL EXAMINATION: ECOG PERFORMANCE STATUS: 1 - Symptomatic but completely ambulatory  Vitals:   02/22/18 0845  BP: 128/69  Pulse: 80  Resp: 17  Temp: 97.9 F (36.6 C)  SpO2: 99%   Filed Weights   02/22/18 0845  Weight: 158 lb 1.6 oz (71.7 kg)    GENERAL:alert, no distress and comfortable SKIN: skin color, texture, turgor are normal, no rashes or significant lesions EYES: normal, Conjunctiva are pink and non-injected, sclera clear OROPHARYNX:no exudate, no erythema and lips, buccal mucosa, and tongue normal  NECK: supple, thyroid normal size, non-tender, without nodularity LYMPH:  no palpable lymphadenopathy in the cervical, axillary or inguinal LUNGS:  clear to auscultation and percussion with normal breathing effort HEART: regular rate & rhythm and no murmurs and no lower extremity edema ABDOMEN:abdomen soft, non-tender and normal bowel sounds MUSCULOSKELETAL:no cyanosis of digits and no clubbing  NEURO: alert & oriented x 3 with fluent speech, no focal motor/sensory deficits EXTREMITIES: No lower extremity edema   LABORATORY DATA:  I have reviewed the data as listed CMP Latest Ref Rng & Units 02/01/2018 01/11/2018 12/21/2017  Glucose 70 - 140 mg/dL 131 111 120  BUN 7 - 26 mg/dL 10 12 8   Creatinine 0.60 - 1.10 mg/dL 0.70 0.72 0.73  Sodium 136 - 145 mmol/L 139 140 141  Potassium 3.5 - 5.1 mmol/L 4.1 4.1 4.1    Chloride 98 - 109 mmol/L 106 107 106  CO2 22 - 29 mmol/L 24 25 26   Calcium 8.4 - 10.4 mg/dL 9.7 9.7 9.4  Total Protein 6.4 - 8.3 g/dL 7.7 7.7 7.6  Total Bilirubin 0.2 - 1.2 mg/dL <0.2(L) <0.2(L) <0.2(L)  Alkaline Phos 40 - 150 U/L 93 85 93  AST 5 - 34 U/L 14 16 18   ALT 0 - 55 U/L 21 23 27     Lab Results  Component Value Date   WBC 6.4 02/22/2018   HGB 10.3 (L) 02/22/2018   HCT 31.9 (L) 02/22/2018   MCV 93.8 02/22/2018   PLT 124 (L) 02/22/2018   NEUTROABS 3.7 02/22/2018    ASSESSMENT & PLAN:  Malignant neoplasm of lower-outer quadrant of left breast of female, estrogen receptor negative ([place]) Hysterectomy with fallopian tube surgery:  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  Today is cycle 3 (Taxol discontinued after cycle 1) Last echo on 11/18/2017: EF 55-60%, followed by cardiology. 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  Today cycle 3.  Taxol has been discontinued because of intolerance she gets carboplatin and Herceptin today  Chemo toxicities: 1.  Severe rash on the face and chest as well as a scalp: We discontinued Taxol from cycle 2.  I renewed her prescription for another round of doxycycline for the acneform rash on the face 2. hair loss and bone pain from Neulasta: Neulasta discontinued because Taxol was stopped  Return to clinic in 3 weeks for Herceptin maintenance therapy alone.   No orders of the defined types were  placed in this encounter.  The patient has a good understanding of the overall plan. she agrees with it. she will call with any problems that may develop before the next visit here.   Erin Ohara, MD 02/22/18

## 2018-02-21 NOTE — Assessment & Plan Note (Signed)
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 Denman George recommends Taxol and carboplatin.  We decided to administer 3 doses of Taxol and carboplatin  Current Treatment: Taxol and carboplatin added to Herceptin treatment every 3 weeks.  Today cycle 3.  Taxol has been discontinued because of intolerance she gets carboplatin and Herceptin today  Chemo toxicities: 1.  Severe rash on the face and chest as well as a scalp: We discontinued Taxol from cycle 2 2. hair loss and bone pain from Neulasta: Neulasta discontinued because Taxol was stopped  Return to clinic in 3 weeks for Herceptin maintenance therapy alone.

## 2018-02-21 NOTE — Assessment & Plan Note (Signed)
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 Denman George recommends Taxol and carboplatin.  We decided to administer 3 doses of Taxol and carboplatin  Current Treatment: Taxol and carboplatin added to Herceptin treatment every 3 weeks.  Today is cycle 3  Last echo on 11/18/2017: EF 55-60%, followed by cardiology.

## 2018-02-22 ENCOUNTER — Inpatient Hospital Stay: Payer: Medicaid Other

## 2018-02-22 ENCOUNTER — Inpatient Hospital Stay (HOSPITAL_BASED_OUTPATIENT_CLINIC_OR_DEPARTMENT_OTHER): Payer: Medicaid Other | Admitting: Hematology and Oncology

## 2018-02-22 ENCOUNTER — Telehealth: Payer: Self-pay | Admitting: Hematology and Oncology

## 2018-02-22 ENCOUNTER — Inpatient Hospital Stay: Payer: Medicaid Other | Attending: Hematology and Oncology

## 2018-02-22 DIAGNOSIS — Z171 Estrogen receptor negative status [ER-]: Secondary | ICD-10-CM

## 2018-02-22 DIAGNOSIS — Z5111 Encounter for antineoplastic chemotherapy: Secondary | ICD-10-CM | POA: Insufficient documentation

## 2018-02-22 DIAGNOSIS — Z1501 Genetic susceptibility to malignant neoplasm of breast: Secondary | ICD-10-CM | POA: Diagnosis not present

## 2018-02-22 DIAGNOSIS — Z90710 Acquired absence of both cervix and uterus: Secondary | ICD-10-CM | POA: Insufficient documentation

## 2018-02-22 DIAGNOSIS — C50512 Malignant neoplasm of lower-outer quadrant of left female breast: Secondary | ICD-10-CM

## 2018-02-22 DIAGNOSIS — C5701 Malignant neoplasm of right fallopian tube: Secondary | ICD-10-CM | POA: Insufficient documentation

## 2018-02-22 DIAGNOSIS — L271 Localized skin eruption due to drugs and medicaments taken internally: Secondary | ICD-10-CM

## 2018-02-22 DIAGNOSIS — Z95828 Presence of other vascular implants and grafts: Secondary | ICD-10-CM

## 2018-02-22 DIAGNOSIS — Z5112 Encounter for antineoplastic immunotherapy: Secondary | ICD-10-CM | POA: Diagnosis not present

## 2018-02-22 DIAGNOSIS — L089 Local infection of the skin and subcutaneous tissue, unspecified: Secondary | ICD-10-CM

## 2018-02-22 LAB — CBC WITH DIFFERENTIAL (CANCER CENTER ONLY)
Basophils Absolute: 0 10*3/uL (ref 0.0–0.1)
Basophils Relative: 0 %
EOS ABS: 0.1 10*3/uL (ref 0.0–0.5)
Eosinophils Relative: 1 %
HCT: 31.9 % — ABNORMAL LOW (ref 34.8–46.6)
HEMOGLOBIN: 10.3 g/dL — AB (ref 11.6–15.9)
LYMPHS ABS: 2.1 10*3/uL (ref 0.9–3.3)
Lymphocytes Relative: 34 %
MCH: 30.3 pg (ref 25.1–34.0)
MCHC: 32.3 g/dL (ref 31.5–36.0)
MCV: 93.8 fL (ref 79.5–101.0)
MONOS PCT: 8 %
Monocytes Absolute: 0.5 10*3/uL (ref 0.1–0.9)
NEUTROS PCT: 57 %
Neutro Abs: 3.7 10*3/uL (ref 1.5–6.5)
Platelet Count: 124 10*3/uL — ABNORMAL LOW (ref 145–400)
RBC: 3.4 MIL/uL — ABNORMAL LOW (ref 3.70–5.45)
RDW: 15.3 % — ABNORMAL HIGH (ref 11.2–14.5)
WBC Count: 6.4 10*3/uL (ref 3.9–10.3)

## 2018-02-22 LAB — CMP (CANCER CENTER ONLY)
ALBUMIN: 3.6 g/dL (ref 3.5–5.0)
ALT: 24 U/L (ref 0–55)
AST: 17 U/L (ref 5–34)
Alkaline Phosphatase: 89 U/L (ref 40–150)
Anion gap: 9 (ref 3–11)
BUN: 10 mg/dL (ref 7–26)
CHLORIDE: 106 mmol/L (ref 98–109)
CO2: 25 mmol/L (ref 22–29)
CREATININE: 0.75 mg/dL (ref 0.60–1.10)
Calcium: 9.5 mg/dL (ref 8.4–10.4)
GFR, Estimated: >60 mL/min (ref >60)
Glucose, Bld: 126 mg/dL (ref 70–140)
Potassium: 4.1 mmol/L (ref 3.5–5.1)
SODIUM: 140 mmol/L (ref 136–145)
Total Bilirubin: <0.2 mg/dL — ABNORMAL LOW (ref 0.2–1.2)
Total Protein: 7.5 g/dL (ref 6.4–8.3)

## 2018-02-22 MED ORDER — PALONOSETRON HCL INJECTION 0.25 MG/5ML
0.2500 mg | Freq: Once | INTRAVENOUS | Status: AC
  Administered 2018-02-22: 0.25 mg via INTRAVENOUS

## 2018-02-22 MED ORDER — DIPHENHYDRAMINE HCL 50 MG/ML IJ SOLN: 25.0000 mg | Freq: Once | INTRAMUSCULAR | Status: DC

## 2018-02-22 MED ORDER — DOXYCYCLINE HYCLATE 100 MG PO TABS: 100.0000 mg | ORAL_TABLET | Freq: Two times a day (BID) | ORAL | 0 refills | Status: DC

## 2018-02-22 MED ORDER — FAMOTIDINE IN NACL 20-0.9 MG/50ML-% IV SOLN
INTRAVENOUS | Status: AC
  Filled 2018-02-22: qty 50

## 2018-02-22 MED ORDER — SODIUM CHLORIDE 0.9% FLUSH
10.0000 mL | INTRAVENOUS | Status: DC | PRN
  Administered 2018-02-22: 10 mL via INTRAVENOUS
  Filled 2018-02-22: qty 10

## 2018-02-22 MED ORDER — TRASTUZUMAB CHEMO 150 MG IV SOLR
450.0000 mg | Freq: Once | INTRAVENOUS | Status: AC
  Administered 2018-02-22: 450 mg via INTRAVENOUS
  Filled 2018-02-22: qty 21.4

## 2018-02-22 MED ORDER — ACETAMINOPHEN 325 MG PO TABS
650.0000 mg | ORAL_TABLET | Freq: Once | ORAL | Status: AC
  Administered 2018-02-22: 650 mg via ORAL

## 2018-02-22 MED ORDER — FAMOTIDINE IN NACL 20-0.9 MG/50ML-% IV SOLN
20.0000 mg | Freq: Once | INTRAVENOUS | Status: AC
  Administered 2018-02-22: 20 mg via INTRAVENOUS

## 2018-02-22 MED ORDER — SODIUM CHLORIDE 0.9% FLUSH
10.0000 mL | INTRAVENOUS | Status: DC | PRN
  Administered 2018-02-22: 10 mL
  Filled 2018-02-22: qty 10

## 2018-02-22 MED ORDER — SODIUM CHLORIDE 0.9 % IV SOLN
Freq: Once | INTRAVENOUS | Status: AC
Start: 2018-02-22 — End: 2018-02-22
  Administered 2018-02-22: 10:00:00 via INTRAVENOUS

## 2018-02-22 MED ORDER — SODIUM CHLORIDE 0.9 % IV SOLN
Freq: Once | INTRAVENOUS | Status: AC
  Administered 2018-02-22: 11:00:00 via INTRAVENOUS
  Filled 2018-02-22: qty 5

## 2018-02-22 MED ORDER — PALONOSETRON HCL INJECTION 0.25 MG/5ML
INTRAVENOUS | Status: AC
Start: 2018-02-22 — End: ?
  Filled 2018-02-22: qty 5

## 2018-02-22 MED ORDER — SODIUM CHLORIDE 0.9 % IV SOLN
575.0000 mg | Freq: Once | INTRAVENOUS | Status: AC
  Administered 2018-02-22: 580 mg via INTRAVENOUS
  Filled 2018-02-22: qty 58

## 2018-02-22 MED ORDER — DIPHENHYDRAMINE HCL 50 MG/ML IJ SOLN
50.0000 mg | Freq: Once | INTRAMUSCULAR | Status: AC
  Administered 2018-02-22: 50 mg via INTRAVENOUS

## 2018-02-22 MED ORDER — DIPHENHYDRAMINE HCL 50 MG/ML IJ SOLN
INTRAMUSCULAR | Status: AC
  Filled 2018-02-22: qty 1

## 2018-02-22 MED ORDER — ACETAMINOPHEN 325 MG PO TABS
ORAL_TABLET | ORAL | Status: AC
  Filled 2018-02-22: qty 2

## 2018-02-22 MED ORDER — HEPARIN SOD (PORK) LOCK FLUSH 100 UNIT/ML IV SOLN
500.0000 [IU] | Freq: Once | INTRAVENOUS | Status: AC | PRN
  Administered 2018-02-22: 500 [IU]
  Filled 2018-02-22: qty 5

## 2018-02-22 NOTE — Telephone Encounter (Signed)
Gave avs and calendar per md he will see patient in the 9th week

## 2018-02-22 NOTE — Patient Instructions (Signed)
[facility] Discharge Instructions for Patients Receiving Chemotherapy  Today you received the following chemotherapy agents:  Herceptin, Carboplatin  To help prevent nausea and vomiting after your treatment, we encourage you to take your nausea medication as prescribed.   If you develop nausea and vomiting that is not controlled by your nausea medication, call the clinic.   BELOW ARE SYMPTOMS THAT SHOULD BE REPORTED IMMEDIATELY:  *FEVER GREATER THAN 100.5 F  *CHILLS WITH OR WITHOUT FEVER  NAUSEA AND VOMITING THAT IS NOT CONTROLLED WITH YOUR NAUSEA MEDICATION  *UNUSUAL SHORTNESS OF BREATH  *UNUSUAL BRUISING OR BLEEDING  TENDERNESS IN MOUTH AND THROAT WITH OR WITHOUT PRESENCE OF ULCERS  *URINARY PROBLEMS  *BOWEL PROBLEMS  UNUSUAL RASH Items with * indicate a potential emergency and should be followed up as soon as possible.  Feel free to call the clinic should you have any questions or concerns. The clinic phone number is [phone].  Please show the [facility] at check-in to the Emergency Department and triage nurse.

## 2018-02-23 ENCOUNTER — Other Ambulatory Visit: Payer: Self-pay

## 2018-02-23 ENCOUNTER — Ambulatory Visit (HOSPITAL_COMMUNITY)
Admission: RE | Admit: 2018-02-23 | Discharge: 2018-02-23 | Disposition: A | Discharge status: Home / Self Care | Payer: Medicaid Other | Source: Ambulatory Visit | Attending: Nurse Practitioner | Admitting: Nurse Practitioner

## 2018-02-23 ENCOUNTER — Ambulatory Visit (HOSPITAL_BASED_OUTPATIENT_CLINIC_OR_DEPARTMENT_OTHER)
Admission: RE | Admit: 2018-02-23 | Discharge: 2018-02-23 | Disposition: A | Discharge status: Home / Self Care | Payer: Medicaid Other | Source: Ambulatory Visit | Attending: Internal Medicine | Admitting: Internal Medicine

## 2018-02-23 VITALS — BP 120/60 | HR 74 | Wt 154.8 lb

## 2018-02-23 DIAGNOSIS — Z9221 Personal history of antineoplastic chemotherapy: Secondary | ICD-10-CM | POA: Insufficient documentation

## 2018-02-23 DIAGNOSIS — E119 Type 2 diabetes mellitus without complications: Secondary | ICD-10-CM | POA: Diagnosis not present

## 2018-02-23 DIAGNOSIS — Z7984 Long term (current) use of oral hypoglycemic drugs: Secondary | ICD-10-CM | POA: Insufficient documentation

## 2018-02-23 DIAGNOSIS — H9190 Unspecified hearing loss, unspecified ear: Secondary | ICD-10-CM | POA: Insufficient documentation

## 2018-02-23 DIAGNOSIS — Z8249 Family history of ischemic heart disease and other diseases of the circulatory system: Secondary | ICD-10-CM | POA: Insufficient documentation

## 2018-02-23 DIAGNOSIS — Z79899 Other long term (current) drug therapy: Secondary | ICD-10-CM | POA: Diagnosis not present

## 2018-02-23 DIAGNOSIS — C5700 Malignant neoplasm of unspecified fallopian tube: Secondary | ICD-10-CM | POA: Insufficient documentation

## 2018-02-23 DIAGNOSIS — Z800 Family history of malignant neoplasm of digestive organs: Secondary | ICD-10-CM | POA: Diagnosis not present

## 2018-02-23 DIAGNOSIS — C50512 Malignant neoplasm of lower-outer quadrant of left female breast: Secondary | ICD-10-CM | POA: Insufficient documentation

## 2018-02-23 DIAGNOSIS — Z833 Family history of diabetes mellitus: Secondary | ICD-10-CM | POA: Diagnosis not present

## 2018-02-23 DIAGNOSIS — Z171 Estrogen receptor negative status [ER-]: Secondary | ICD-10-CM

## 2018-02-23 DIAGNOSIS — R0600 Dyspnea, unspecified: Secondary | ICD-10-CM | POA: Diagnosis not present

## 2018-02-23 DIAGNOSIS — F329 Major depressive disorder, single episode, unspecified: Secondary | ICD-10-CM | POA: Diagnosis not present

## 2018-02-23 DIAGNOSIS — Z803 Family history of malignant neoplasm of breast: Secondary | ICD-10-CM | POA: Insufficient documentation

## 2018-02-23 DIAGNOSIS — I10 Essential (primary) hypertension: Secondary | ICD-10-CM | POA: Diagnosis not present

## 2018-02-23 DIAGNOSIS — Z90710 Acquired absence of both cervix and uterus: Secondary | ICD-10-CM | POA: Diagnosis not present

## 2018-02-23 DIAGNOSIS — Z1501 Genetic susceptibility to malignant neoplasm of breast: Secondary | ICD-10-CM | POA: Insufficient documentation

## 2018-02-23 NOTE — Progress Notes (Signed)
[facility]ED [facility] CLINIC NOTE   Referring Physician: Dr [name]  Primary Care: Primary Cardiologist:None   HPI: Erin [name] is 57 year old with h/o deafness, DMII, HTN, left breat cancer referred to the Cardio-Oncology clinic by Dr [name] .   History obtained through sign language interpreter  Diagnosed with left breast CA (BRCA +) in 8/18 ER/PR negative, HER-2 +. Had left lumpectomy. Taxol/Herceptin completed on 09/29/2017, went on to receive every 3 week adjuvant Herceptin on 10/20/2017. Underwent hysterectomy and found to have fallopian tube CA as well.  Says she is doing well. No CP or sob. No edema, orthopnea or PND. Tolelrating herceptin well.   Echo: EF 60-65%. RV normal. GLS underestimated. Personally reviewed   SUMMARY OF ONCOLOGIC HISTORY:       Malignant neoplasm of lower-outer quadrant of left breast of female, estrogen receptor negative ([place])   05/11/2017 Initial Diagnosis    Left breast biopsy 3:30 position 6 cm from nipple: IDC with DCIS, lymphovascular invasion present, grade 2-3, ER 0%, PR 0%, HER-2 positive ratio 2.28, Ki-67 20%, 1 cm lesion left breast, T1b N0 stage IA clinical stage      06/09/2017 Surgery    Left lumpectomy: IDC grade 3, 1.2 cm, DCIS is present, margins negative, 0/6 lymph nodes negative, ER 0%, PR 0%, HER-2 positive ratio 2.28, Ki-67 20%, T1 BN 0 stage IA      07/09/2017 -  Adjuvant Chemotherapy    Taxol/Herceptin completed on 09/29/2017, went on to receive every 3 week adjuvant Herceptin on 10/20/2017      12/21/2017 -  Chemotherapy    The patient had palonosetron (ALOXI) injection 0.25 mg, 0.25 mg, Intravenous,  Once, 2 of 3 cycles Administration: 0.25 mg (01/11/2018), 0.25 mg (02/01/2018) pegfilgrastim (NEULASTA ONPRO KIT) injection 6 mg, 6 mg, Subcutaneous, Once, 1 of 1 cycle Administration: 6 mg (01/11/2018) CARBOplatin (PARAPLATIN) 580 mg in sodium chloride 0.9 % 250 mL chemo infusion, 580 mg (100 % of original dose 575 mg),  Intravenous,  Once, 2 of 3 cycles Dose modification:   (original dose 575 mg, Cycle 1) Administration: 580 mg (01/11/2018), 580 mg (02/01/2018) PACLitaxel (TAXOL) 306 mg in sodium chloride 0.9 % 500 mL chemo infusion (> 38m/m2), 175 mg/m2 = 306 mg, Intravenous,  Once, 1 of 1 cycle Administration: 306 mg (01/11/2018) fosaprepitant (EMEND) 150 mg, dexamethasone (DECADRON) 12 mg in sodium chloride 0.9 % 145 mL IVPB, , Intravenous,  Once, 2 of 3 cycles Administration:  (01/11/2018),  (02/01/2018)  for chemotherapy treatment.       02/15/2018 Genetic Testing    The Common Hereditary Cancer Panel offered by [facility] includes sequencing and/or deletion duplication testing of the following 47 genes: APC, ATM, AXIN2, BARD1, BMPR1A, BRCA1, BRCA2, BRIP1, CDH1, CDKN2A (p14ARF), CDKN2A (p16INK4a), CKD4, CHEK2, CTNNA1, DICER1, EPCAM (Deletion/duplication testing only), GREM1 (promoter region deletion/duplication testing only), KIT, MEN1, MLH1, MSH2, MSH3, MSH6, MUTYH, NBN, NF1, NHTL1, PALB2, PDGFRA, PMS2, POLD1, POLE, PTEN, RAD50, RAD51C, RAD51D, SDHB, SDHC, SDHD, SMAD4, SMARCA4. STK11, TP53, TSC1, TSC2, and VHL.  The following genes were evaluated for sequence changes only: SDHA and HOXB13 c.251G>A variant only.   Results: POSITIVE. 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  ECHO 05/2017 EF 60-65% Lat S' GLS     Past Medical History:  Diagnosis  Date  . Anemia   . Arthritis    knees, elbows  . BRCA2 gene mutation positive 02/17/2018   BRCA2 c.4552del (p.Glu1518Asnfs*25)  Result reported out on 02/15/2018.   . Breast cancer ([place]) 05/2017   left  . Cough 06/03/2017  . Deaf   . Depression   . Dyspnea    states some SOB with ADLs and aanemia   . Family history of breast cancer   . Family history of breast cancer   . Family history of colon cancer   . Genetic testing 02/17/2018   The Common Hereditary Cancer Panel offered by [facility] includes sequencing and/or deletion duplication testing of the following 47 genes: APC, ATM, AXIN2, BARD1, BMPR1A, BRCA1, BRCA2, BRIP1, CDH1, CDKN2A (p14ARF), CDKN2A (p16INK4a), CKD4, CHEK2, CTNNA1, DICER1, EPCAM (Deletion/duplication testing only), GREM1 (promoter region deletion/duplication testing only), KIT, MEN1, MLH1, MSH2, MSH3, MSH6, MU  . High cholesterol   . History of blood transfusion 07/2017   [facility]  . Hypertension   . Non-insulin dependent type 2 diabetes mellitus ([place])   . Runny nose 06/03/2017   clear drainage, per pt.  . SVD (spontaneous vaginal delivery)    x 2    Current Outpatient Medications  Medication Sig Dispense Refill  . atorvastatin (LIPITOR) 40 MG tablet Take 1 tablet (40 mg total) by mouth daily. 90 tablet 1  . Blood Glucose Monitoring Suppl (ACCU-CHEK AVIVA PLUS) W/DEVICE KIT Check Blood sugar AM fasting and before dinner 1 kit 0  . cholecalciferol (VITAMIN D) 1000 units tablet Take 1,000 Units by mouth daily.    Marland Kitchen doxycycline (VIBRA-TABS) 100 MG tablet Take 1 tablet (100 mg total) by mouth 2 (two) times daily. 20 tablet 0  . ferrous sulfate 325 (65 FE) MG tablet Take 1 tablet (325 mg total) by mouth daily before breakfast. 90 tablet 1  . glucose blood (ACCU-CHEK AVIVA) test strip Use as instructed 100 each 12  . Lancet Devices (ACCU-CHEK SOFTCLIX) lancets Use as instructed 1 each 0  . lidocaine-prilocaine (EMLA) cream Apply to affected area once 30 g 3  . lisinopril  (PRINIVIL,ZESTRIL) 10 MG tablet Take 1 tablet (10 mg total) by mouth daily. 30 tablet 1  . metFORMIN (GLUCOPHAGE XR) 500 MG 24 hr tablet Take 2 tablets (1,000 mg total) by mouth daily with breakfast. 180 tablet 0  . mupirocin cream (BACTROBAN) 2 % Apply 1 application topically 4 (four) times daily. 30 g 2  . Omega-3 Fatty Acids (FISH OIL OMEGA-3 PO) Take 1 capsule by mouth daily.     . TRUEPLUS LANCETS 28G MISC USE AS DIRECTED BY PHYSICIAN 100 each 0   Current Facility-Administered Medications  Medication Dose Route Frequency Provider Last Rate Last Dose  . 0.9 %  sodium chloride infusion  500 mL Intravenous Once Nandigam, Venia Minks, MD        No Known Allergies    Social History   Socioeconomic History  . Marital status: Legally Separated    Spouse name: Not on file  . Number of children: Not on file  . Years of education: Not on file  . Highest education  level: Not on file  Occupational History  . Not on file  Social Needs  . Financial resource strain: Not on file  . Food insecurity:    Worry: Not on file    Inability: Not on file  . Transportation needs:    Medical: Not on file    Non-medical: Not on file  Tobacco Use  . Smoking status: Never Smoker  . Smokeless tobacco: Never Used  Substance and Sexual Activity  . Alcohol use: No  . Drug use: No  . Sexual activity: Never    Birth control/protection: None  Lifestyle  . Physical activity:    Days per week: Not on file    Minutes per session: Not on file  . Stress: Not on file  Relationships  . Social connections:    Talks on phone: Not on file    Gets together: Not on file    Attends religious service: Not on file    Active member of club or organization: Not on file    Attends meetings of clubs or organizations: Not on file    Relationship status: Not on file  . Intimate partner violence:    Fear of current or ex partner: Not on file    Emotionally abused: Not on file    Physically abused: Not on file     Forced sexual activity: Not on file  Other Topics Concern  . Not on file  Social History Narrative   1 boy and 1 girl   Resides in [place]   Seperated      Family History  Problem Relation Age of Onset  . Hypertension Mother   . Diabetes Father   . Hypertension Sister   . Diabetes Paternal Grandmother   . Colon cancer Paternal Grandmother 52  . CAD Brother   . Diabetes Brother   . Breast cancer Maternal Aunt        dx >50  . Heart attack Paternal Grandfather   . Breast cancer Maternal Aunt        dx under 81  . Breast cancer Maternal Aunt        dx  under 50    Vitals:   02/23/18 1058  BP: 120/60  Pulse: 74  SpO2: 100%  Weight: 154 lb 12.8 oz (70.2 kg)    PHYSICAL EXAM: General:  Well appearing. No resp difficulty Here with sign language interpreter HEENT: normal Neck: supple. no JVD. Carotids 2+ bilat; no bruits. No lymphadenopathy or thryomegaly appreciated. Cor: PMI nondisplaced. Regular rate & rhythm. No rubs, gallops or murmurs.+ port Lungs: clear Abdomen: soft, nontender, nondistended. No hepatosplenomegaly. No bruits or masses. Good bowel sounds. Extremities: no cyanosis, clubbing, rash, edema Neuro: alert & orientedx3, cranial nerves grossly intact except for deafness. moves all 4 extremities w/o difficulty. Affect pleasant   ASSESSMENT & PLAN:  1. Breast Cancer, left - BRCA+   - T1b N0 stage IA clinical stage  - I reviewed echos personally. EF and Doppler parameters stable. No HF on exam. Continue Herceptin.   - Will complete Hereptin in 10/19. Will schedule for final surveillance echo in 3-4 months.   Glori Bickers MD 11:17 AM

## 2018-02-23 NOTE — Patient Instructions (Signed)
Your physician recommends that you schedule a follow-up appointment in: 3 months with echocardiogram  

## 2018-03-02 ENCOUNTER — Ambulatory Visit
Admission: RE | Admit: 2018-03-02 | Discharge: 2018-03-02 | Disposition: A | Discharge status: Home / Self Care | Payer: Medicaid Other | Source: Ambulatory Visit | Attending: Radiation Oncology | Admitting: Radiation Oncology

## 2018-03-02 ENCOUNTER — Encounter: Payer: Self-pay | Admitting: Genetics

## 2018-03-02 ENCOUNTER — Other Ambulatory Visit: Payer: Self-pay

## 2018-03-02 ENCOUNTER — Inpatient Hospital Stay (HOSPITAL_BASED_OUTPATIENT_CLINIC_OR_DEPARTMENT_OTHER): Payer: Medicaid Other | Admitting: Genetics

## 2018-03-02 ENCOUNTER — Encounter: Payer: Self-pay | Admitting: Radiation Oncology

## 2018-03-02 VITALS — BP 126/76 | HR 98 | Temp 97.9°F | Resp 18 | Ht 61.0 in | Wt 154.6 lb

## 2018-03-02 DIAGNOSIS — Z800 Family history of malignant neoplasm of digestive organs: Secondary | ICD-10-CM

## 2018-03-02 DIAGNOSIS — Z1501 Genetic susceptibility to malignant neoplasm of breast: Secondary | ICD-10-CM

## 2018-03-02 DIAGNOSIS — Z79899 Other long term (current) drug therapy: Secondary | ICD-10-CM | POA: Diagnosis not present

## 2018-03-02 DIAGNOSIS — C5700 Malignant neoplasm of unspecified fallopian tube: Secondary | ICD-10-CM | POA: Diagnosis not present

## 2018-03-02 DIAGNOSIS — Z7984 Long term (current) use of oral hypoglycemic drugs: Secondary | ICD-10-CM | POA: Diagnosis not present

## 2018-03-02 DIAGNOSIS — Z171 Estrogen receptor negative status [ER-]: Secondary | ICD-10-CM

## 2018-03-02 DIAGNOSIS — C50512 Malignant neoplasm of lower-outer quadrant of left female breast: Secondary | ICD-10-CM | POA: Diagnosis not present

## 2018-03-02 DIAGNOSIS — C5701 Malignant neoplasm of right fallopian tube: Secondary | ICD-10-CM

## 2018-03-02 DIAGNOSIS — Z1509 Genetic susceptibility to other malignant neoplasm: Secondary | ICD-10-CM

## 2018-03-02 DIAGNOSIS — D0512 Intraductal carcinoma in situ of left breast: Secondary | ICD-10-CM | POA: Diagnosis not present

## 2018-03-02 DIAGNOSIS — Z803 Family history of malignant neoplasm of breast: Secondary | ICD-10-CM

## 2018-03-03 ENCOUNTER — Encounter (HOSPITAL_COMMUNITY): Payer: Self-pay | Admitting: *Deleted

## 2018-03-03 NOTE — Progress Notes (Signed)
Radiation Oncology         [phone] ________________________________  Name: Erin [name] MRN: [id number]  Date of Service: 03/02/2018  DOB: [birth year]  Post Treatment Note  CC: [name], NP  Excell Seltzer, MD  Diagnosis:  Stage IA, pT1cN0M0 grade 3, ER/PR negative, HER2 amplified, invasive ductal carcinoma with DCIS of the left breast.  Interval Since Last Radiation:  7 weeks   11/25/2017 - 01/10/2018: The patient initially received a dose of 50.4 Gy in 28 fractions to the breast using whole-breast tangent fields. This was delivered using a 3-D conformal technique. The patient then received a boost to the seroma. This delivered an additional 10 Gy in 5 fractions using a 3-D technique. The total dose was 60.4 Gy.   Narrative:  The patient returns today for routine follow-up. During treatment she did very well with radiotherapy and did not have significant desquamation. Of note she continues on systemic chemotherapy for her fallopian tube cancer.                           On review of systems, the patient states she's doing very well and denies any difficulty with her skin at this time. No other complaints are noted.   ALLERGIES:  has No Known Allergies.  Meds: Current Outpatient Medications  Medication Sig Dispense Refill  . atorvastatin (LIPITOR) 40 MG tablet Take 1 tablet (40 mg total) by mouth daily. 90 tablet 1  . Blood Glucose Monitoring Suppl (ACCU-CHEK AVIVA PLUS) W/DEVICE KIT Check Blood sugar AM fasting and before dinner 1 kit 0  . cholecalciferol (VITAMIN D) 1000 units tablet Take 1,000 Units by mouth daily.    Marland Kitchen doxycycline (VIBRA-TABS) 100 MG tablet Take 1 tablet (100 mg total) by mouth 2 (two) times daily. 20 tablet 0  . ferrous sulfate 325 (65 FE) MG tablet Take 1 tablet (325 mg total) by mouth daily before breakfast. 90 tablet 1  . glucose blood (ACCU-CHEK AVIVA) test strip Use as instructed 100 each 12  . Lancet Devices (ACCU-CHEK SOFTCLIX) lancets  Use as instructed 1 each 0  . lidocaine-prilocaine (EMLA) cream Apply to affected area once 30 g 3  . lisinopril (PRINIVIL,ZESTRIL) 10 MG tablet Take 1 tablet (10 mg total) by mouth daily. 30 tablet 1  . metFORMIN (GLUCOPHAGE XR) 500 MG 24 hr tablet Take 2 tablets (1,000 mg total) by mouth daily with breakfast. 180 tablet 0  . Omega-3 Fatty Acids (FISH OIL OMEGA-3 PO) Take 1 capsule by mouth daily.     . TRUEPLUS LANCETS 28G MISC USE AS DIRECTED BY PHYSICIAN 100 each 0  . mupirocin cream (BACTROBAN) 2 % Apply 1 application topically 4 (four) times daily. (Patient not taking: Reported on 03/02/2018) 30 g 2   Current Facility-Administered Medications  Medication Dose Route Frequency Provider Last Rate Last Dose  . 0.9 %  sodium chloride infusion  500 mL Intravenous Once Nandigam, Venia Minks, MD        Physical Findings:  height is 5' 1"  (1.549 m) and weight is 154 lb 9.6 oz (70.1 kg). Her oral temperature is 97.9 F (36.6 C). Her blood pressure is 126/76 and her pulse is 98. Her respiration is 18 and oxygen saturation is 100%.  Pain Assessment Pain Score: 0-No pain/10 In general this is a well appearing African American female in no acute distress. She's alert and oriented x4 and appropriate throughout the examination. Cardiopulmonary assessment is negative for  acute distress and she exhibits normal effort. The left breast was examined and reveals mild hyperpigmentation without desquamation.   Lab Findings: Lab Results  Component Value Date   WBC 6.4 02/22/2018   HGB 10.3 (L) 02/22/2018   HCT 31.9 (L) 02/22/2018   MCV 93.8 02/22/2018   PLT 124 (L) 02/22/2018     Radiographic Findings: No results found.  Impression/Plan: 1. Stage IA, pT1cN0M0 grade 3, ER/PR negative, HER2 amplified, invasive ductal carcinoma with DCIS of the left breast. The patient has been doing well since completion of radiotherapy. We discussed that we would be happy to continue to follow her as needed, but she will  also continue to follow up with Dr. [name] in medical oncology. She was counseled on skin care as well as measures to avoid sun exposure to this area.  2. Survivorship. We discussed the importance of survivorship evaluation and she is has already met Wilber Bihari and is currently scheduled for this in the near future. She was also given the monthly calendar for access to resources offered within the cancer center. 3. 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 The patient will continue on Taxol/Carboplatin per Dr. Denman George and Dr. [name].     Carola Rhine, PAC

## 2018-03-04 NOTE — Progress Notes (Signed)
GENETIC TEST RESULTS   Patient Name: Erin [name] Patient Age: 57 y.o. Encounter Date: 03/02/2018  Referring Provider: [name] MD  [name] was seen in the [place] clinic on 01/04/2018 due to a personal and family history of cancer and concern regarding a hereditary predisposition to cancer in the family. Please refer to the prior Genetics clinic note for more information regarding [name]'s medical and family histories and our assessment at the time.   FAMILY HISTORY:  We obtained a detailed, 4-generation family history.  Significant diagnoses are listed below: Family History  Problem Relation Age of Onset  . Hypertension Mother   . Diabetes Father   . Hypertension Sister   . Diabetes Paternal Grandmother   . Colon cancer Paternal Grandmother 38  . CAD Brother   . Diabetes Brother   . Breast cancer Maternal Aunt        dx >50  . Heart attack Paternal Grandfather   . Breast cancer Maternal Aunt        dx under 87  . Breast cancer Maternal Aunt        dx  under 39   [name] has a 55 year-old son and a 21 year-old daughter with no history of cancer.  [name] has a brother who died due to heart disease, and a sister who is in her 57's with no history of cancer.  Her brother has 3 children, and her sister has 1 daughter.  [name] has a paternal half-brother, but does not know any information about his health.   [name] father: died at 5 with no history of cancer.  Paternal Aunts/Uncles: 1 paternal uncle with no history of cancer.  Paternal cousins: 3 paternal cousins- no history of cancer.  Paternal grandfather: died of a heart attack.  Paternal grandmother:history of colon cancer dx in her 74's.   [name] mother: alive, 29, no history of cancer.  She has her uterus and ovaries intact.   Maternal Aunts/Uncles: 3 maternal half- aungs (through maternal GF) with a history of breast cancer.  2 were diagnosed with breast cancer under the age of 54, and  the other was diagnosed over 89 years of age.  Maternal cousins: no history of cancer reported.  Maternal grandfather: no history of cancer.  Maternal grandmother: alive, 74yo, no history of cancer.   [name] is unaware of previous family history of genetic testing for hereditary cancer risks. Patient's maternal ancestors are of African American descent, and paternal ancestors are of African American descent. There is no reported Ashkenazi Jewish ancestry. There is no known consanguinity.  GENETIC TESTING:  At the time of [name]'s visit, we recommended she pursue genetic testing of the Common Hereditary Cancer Panel. The genetic testing reported on 02/15/2018 by Larae Grooms identified a single, heterozygous pathogenic gene mutation called BRCA2,  c.4552del (p.Glu1518Asnfs*25). There were no deleterious mutations in The Common Hereditary Cancer Panel offered by [facility] includes sequencing and/or deletion duplication testing of the following 47 genes: APC, ATM, AXIN2, BARD1, BMPR1A, BRCA1, BRIP1, CDH1, CDKN2A (p14ARF), CDKN2A (p16INK4a), CKD4, CHEK2, CTNNA1, DICER1, EPCAM (Deletion/duplication testing only), GREM1 (promoter region deletion/duplication testing only), KIT, MEN1, MLH1, MSH2, MSH3, MSH6, MUTYH, NBN, NF1, NHTL1, PALB2, PDGFRA, PMS2, POLD1, POLE, PTEN, RAD50, RAD51C, RAD51D, SDHB, SDHC, SDHD, SMAD4, SMARCA4. STK11, TP53, TSC1, TSC2, and VHL.  The following genes were evaluated for sequence changes only: SDHA and HOXB13 c.251G>A variant only.  BRCA2: Women who have a BRCA mutation have  an increased risk for both breast and ovarian cancer. They are also some other cancer risks associated with BRCA2 mutation. Marland Kitchen   MEDICAL MANAGEMENTfor BRCA: Women who have aBRCA mutationhave asignificantlyincreased risk to develop breast and ovarian cancer. These individuals are also at a higher than average risk to develop a second breast cancer. Individuals with BRCA2 mutations are also at a  somewhat elevated risk to develop pancreatic cancer an melanoma.  Men with a BRCA mutation are also at increased risk for female breast cancer and prostate cancer.     We discussed with Ms. Moorethe recommendations for women who have a BRCA pathogenic variant. Some women elect to have aprophylactic bilateral mastectomy.  Others pursue high risk breast screening that includesyearly mammograms, yearly breast MRI, twice-yearly clinical breast exams through a high-risk clinic, and monthly self-breast exams.  [name] expressed that at this time she would like to proceed with the high risk screening protocol.   To reduce therisk for ovarian cancer,it is recommended women with BRCA pathogenic variantshave a prophylactic bilateral salpingo-oophorectomy whenchildbearingis completed, if planned.For Individuals with BRCA2 mutations this is recommended to occur between the ages of 8-45.   [name] has already had her ovaries and fallopian tubes removed due to her fallopian tube cancer.  Therefore, it is recommended she just continue to follow all recommendations from her doctors regarding treatment and management for this cancer.   Other Cancer Risks:  There are no specific NCCN guidelines regarding melanoma or pancreatic cancer screening for individuals with BRCA mutations. However, sun protection is recommended, and routine skin exams by a dermatologist can be considered.  There are are no definitive guidelines regarding pancreatic cancer screening in individuals with BRCA2 mutations. However, screening methods such as MRI/ERCP and EUS can be considered on a case by case basis. The effectiveness of these screening methods is still being studied and currently unknown.  These guidelines are based on current NCCN guidelines.  These guidelines are subject to change and should be reference directly when managing future care.   RISK REDUCTION: There are several things that can be offered to  individuals who are carriers for BRCA mutations that will reduce the risk for getting cancer.    The use of oral contraceptives can lower the risk for ovarian cancer, and, per case control studies, does not significantly increase the risk for breast cancer in BRCA patients.  Case control studies have shown that oral contraceptives can lower the risk for ovarian cancer in women with BRCA mutations. Additionally, a more recent meta-analysis, including one corhort (n=3,181) and one case control study (1,096 cases and 2,878 controls) also showed an inverse correlation between ovarian cancer and ever having used oral contraceptives (OR, 0.58; 95% CI = 0.46-0.73).  Studies on oral contraceptives and breast cancer have been conflicting, with some studies suggesting that there is not an increased risk for breast cancer in BRCA mutation carriers, while others suggest that there could be a risk.  That said, two meta-analysis studies have shown that there is not an increased risk for breast cancer with oral contraceptive use in BRCA1 and BRCA2 carriers.    In individuals who have a prophylactic bilateral salpino-oophorectomy (BSO), the risk for breast cancer is reduced by up to 50%.  It has been reported that short term hormone replacement therapy in women undergoing prophylactic BSO does not negate the reduction of breast cancer risk associated with surgery (1.2018 NCCN guidelines).  FAMILY MEMBERS: It is important that all of Ms. Barclift's relatives (  both men and women) know of the presence of this gene mutation. Site-specific genetic testing can sort out who in the family is at risk and who is not.   Ms. Birchard children and siblings each  have a 50% chance to have inherited this mutation. Her daughter is oly 74, therefore this will not be of any consequence to her for several years. We do not test children because there is no risk to them until they are adults. We recommend she have genetic counseling and testing by  the time they are in their early 57s.  Ms. Melchior son and siblings, however, are recommended to have genetic testing now to allow them the opportunity for risk-reducing/preventative  measures.   Reproductive Risks: People with 1 BRCA2 mutation have the cancer risks we have mentioned above. People who have 2 BRCA2 mutations can have a more serious childhood genetic disorder called Fanconi Anemia. Ms. Badour only has 1 BRCA2 mutation and DOES NOT have this disease. However, if any relatives have this BRCA2 mutation and were planning to have children, they may want to have their partner tested to see if they also have a BRCA2 mutation.  If 2 BRCA2 carriers have children together, there would be a risk for future children to have Fanconi Anemia.   PLAN:   1. These results will be made available to her care team at the [facility]. She would like her oncologist, [name] to follow her long-term for this indication and coordinate screening/prophylactic surgeries, etc.     2. Ms. Vanmeter plans to discuss these results with her family and will reach out to [place] if we can be of any assistance in coordinating genetic testing for any of her relatives.  Her mother was added on to her appointment and is interested in being genetically tested.     SUPPORT AND RESOURCES: If Ms. Mabe is interested in BRCA-specific information and support, there are two groups, Facing Our Risk (www.facingourrisk.com) and Bright Pink (www.brightpink.org) which some people have found useful. They provide opportunities to speak with other individuals from high-risk families. To locate genetic counselors in other cities, visit the website of the [employer] (ArtistMovie.se) and Secretary/administrator for a Social worker by zip code.  We encouraged Ms. Dobie to remain in contact with [place] on an annual basis so we can update her personal and family histories, and let her know of advances in cancer genetics that may benefit  the family. Our contact number was provided. Ms. Behrendt questions were answered to her satisfaction today, and she knows she is welcome to call anytime with additional questions.   Tana Felts, Ms, [facility] Certified Genetic Counselor [email or web address] phone: [phone]

## 2018-03-15 ENCOUNTER — Inpatient Hospital Stay: Payer: Medicaid Other

## 2018-03-15 ENCOUNTER — Inpatient Hospital Stay: Payer: Medicaid Other | Attending: Hematology and Oncology

## 2018-03-15 VITALS — BP 130/72 | HR 88 | Temp 97.8°F | Resp 19 | Wt 156.2 lb

## 2018-03-15 DIAGNOSIS — C5701 Malignant neoplasm of right fallopian tube: Secondary | ICD-10-CM | POA: Insufficient documentation

## 2018-03-15 DIAGNOSIS — Z90710 Acquired absence of both cervix and uterus: Secondary | ICD-10-CM | POA: Diagnosis not present

## 2018-03-15 DIAGNOSIS — Z5112 Encounter for antineoplastic immunotherapy: Secondary | ICD-10-CM | POA: Diagnosis not present

## 2018-03-15 DIAGNOSIS — Z171 Estrogen receptor negative status [ER-]: Secondary | ICD-10-CM | POA: Diagnosis not present

## 2018-03-15 DIAGNOSIS — C50512 Malignant neoplasm of lower-outer quadrant of left female breast: Secondary | ICD-10-CM | POA: Insufficient documentation

## 2018-03-15 DIAGNOSIS — Z95828 Presence of other vascular implants and grafts: Secondary | ICD-10-CM

## 2018-03-15 DIAGNOSIS — Z1501 Genetic susceptibility to malignant neoplasm of breast: Secondary | ICD-10-CM | POA: Insufficient documentation

## 2018-03-15 LAB — CBC WITH DIFFERENTIAL (CANCER CENTER ONLY)
BASOS ABS: 0 10*3/uL (ref 0.0–0.1)
BASOS PCT: 0 %
EOS ABS: 0 10*3/uL (ref 0.0–0.5)
Eosinophils Relative: 0 %
HEMATOCRIT: 31.5 % — AB (ref 34.8–46.6)
Hemoglobin: 10.5 g/dL — ABNORMAL LOW (ref 11.6–15.9)
LYMPHS PCT: 35 %
Lymphs Abs: 2 10*3/uL (ref 0.9–3.3)
MCH: 31.4 pg (ref 25.1–34.0)
MCHC: 33.3 g/dL (ref 31.5–36.0)
MCV: 94.3 fL (ref 79.5–101.0)
Monocytes Absolute: 0.6 10*3/uL (ref 0.1–0.9)
Monocytes Relative: 10 %
Neutro Abs: 3.1 10*3/uL (ref 1.5–6.5)
Neutrophils Relative %: 55 %
PLATELETS: 113 10*3/uL — AB (ref 145–400)
RBC: 3.34 MIL/uL — ABNORMAL LOW (ref 3.70–5.45)
RDW: 16.7 % — ABNORMAL HIGH (ref 11.2–14.5)
WBC Count: 5.7 10*3/uL (ref 3.9–10.3)

## 2018-03-15 LAB — CMP (CANCER CENTER ONLY)
ALBUMIN: 3.9 g/dL (ref 3.5–5.0)
ALK PHOS: 101 U/L (ref 38–126)
ALT: 29 U/L (ref 0–44)
AST: 18 U/L (ref 15–41)
Anion gap: 8 (ref 5–15)
BUN: 12 mg/dL (ref 6–20)
CALCIUM: 9.6 mg/dL (ref 8.9–10.3)
CO2: 27 mmol/L (ref 22–32)
CREATININE: 0.73 mg/dL (ref 0.44–1.00)
Chloride: 104 mmol/L (ref 98–111)
GFR, Est AFR Am: >60 mL/min (ref >60)
GLUCOSE: 135 mg/dL — AB (ref 70–99)
POTASSIUM: 4.2 mmol/L (ref 3.5–5.1)
Sodium: 139 mmol/L (ref 135–145)
Total Bilirubin: 0.3 mg/dL (ref 0.3–1.2)
Total Protein: 7.6 g/dL (ref 6.5–8.1)

## 2018-03-15 MED ORDER — HEPARIN SOD (PORK) LOCK FLUSH 100 UNIT/ML IV SOLN
500.0000 [IU] | Freq: Once | INTRAVENOUS | Status: AC | PRN
  Administered 2018-03-15: 500 [IU]
  Filled 2018-03-15: qty 5

## 2018-03-15 MED ORDER — TRASTUZUMAB CHEMO 150 MG IV SOLR
450.0000 mg | Freq: Once | INTRAVENOUS | Status: AC
  Administered 2018-03-15: 450 mg via INTRAVENOUS
  Filled 2018-03-15: qty 21.43

## 2018-03-15 MED ORDER — DIPHENHYDRAMINE HCL 50 MG/ML IJ SOLN
INTRAMUSCULAR | Status: AC
  Filled 2018-03-15: qty 1

## 2018-03-15 MED ORDER — ACETAMINOPHEN 325 MG PO TABS
ORAL_TABLET | ORAL | Status: AC
  Filled 2018-03-15: qty 2

## 2018-03-15 MED ORDER — SODIUM CHLORIDE 0.9 % IV SOLN
Freq: Once | INTRAVENOUS | Status: AC
  Administered 2018-03-15: 12:00:00 via INTRAVENOUS

## 2018-03-15 MED ORDER — SODIUM CHLORIDE 0.9% FLUSH
10.0000 mL | INTRAVENOUS | Status: DC | PRN
  Administered 2018-03-15: 10 mL via INTRAVENOUS
  Filled 2018-03-15: qty 10

## 2018-03-15 MED ORDER — ACETAMINOPHEN 325 MG PO TABS
650.0000 mg | ORAL_TABLET | Freq: Once | ORAL | Status: AC
  Administered 2018-03-15: 650 mg via ORAL

## 2018-03-15 MED ORDER — DIPHENHYDRAMINE HCL 50 MG/ML IJ SOLN
50.0000 mg | Freq: Once | INTRAMUSCULAR | Status: AC
  Administered 2018-03-15: 50 mg via INTRAVENOUS

## 2018-03-15 MED ORDER — SODIUM CHLORIDE 0.9% FLUSH
10.0000 mL | INTRAVENOUS | Status: DC | PRN
  Administered 2018-03-15: 10 mL
  Filled 2018-03-15: qty 10

## 2018-03-15 NOTE — Patient Instructions (Signed)
[facility] Discharge Instructions for Patients Receiving Chemotherapy  Today you received the following chemotherapy agents Herceptin  To help prevent nausea and vomiting after your treatment, we encourage you to take your nausea medication as directed   If you develop nausea and vomiting that is not controlled by your nausea medication, call the clinic.   BELOW ARE SYMPTOMS THAT SHOULD BE REPORTED IMMEDIATELY:  *FEVER GREATER THAN 100.5 F  *CHILLS WITH OR WITHOUT FEVER  NAUSEA AND VOMITING THAT IS NOT CONTROLLED WITH YOUR NAUSEA MEDICATION  *UNUSUAL SHORTNESS OF BREATH  *UNUSUAL BRUISING OR BLEEDING  TENDERNESS IN MOUTH AND THROAT WITH OR WITHOUT PRESENCE OF ULCERS  *URINARY PROBLEMS  *BOWEL PROBLEMS  UNUSUAL RASH Items with * indicate a potential emergency and should be followed up as soon as possible.  Feel free to call the clinic should you have any questions or concerns. The clinic phone number is [phone].  Please show the CHEMO ALERT CARD at check-in to the Emergency Department and triage nurse.   

## 2018-03-28 ENCOUNTER — Telehealth: Payer: Self-pay | Admitting: Nurse Practitioner

## 2018-03-28 NOTE — Telephone Encounter (Signed)
Patient called with an interrater and requested for listed medication to be refilled and sent to the pharmacy  lisinopril (PRINIVIL,ZESTRIL) 10 MG tablet [[id number]]  [id number] - [place], [place] - [address]

## 2018-03-29 ENCOUNTER — Other Ambulatory Visit: Payer: Self-pay

## 2018-03-29 DIAGNOSIS — I10 Essential (primary) hypertension: Secondary | ICD-10-CM

## 2018-03-29 MED ORDER — LISINOPRIL 10 MG PO TABS: 10.0000 mg | ORAL_TABLET | Freq: Every day | ORAL | 1 refills | Status: DC

## 2018-03-29 NOTE — Telephone Encounter (Signed)
Rx sent 

## 2018-03-30 ENCOUNTER — Ambulatory Visit: Payer: Medicaid Other | Attending: Nurse Practitioner | Admitting: Pharmacist

## 2018-03-30 VITALS — BP 108/73 | HR 88

## 2018-03-30 DIAGNOSIS — E119 Type 2 diabetes mellitus without complications: Secondary | ICD-10-CM | POA: Diagnosis not present

## 2018-03-30 DIAGNOSIS — I10 Essential (primary) hypertension: Secondary | ICD-10-CM | POA: Diagnosis not present

## 2018-03-30 DIAGNOSIS — E785 Hyperlipidemia, unspecified: Secondary | ICD-10-CM | POA: Insufficient documentation

## 2018-03-30 DIAGNOSIS — Z79899 Other long term (current) drug therapy: Secondary | ICD-10-CM | POA: Insufficient documentation

## 2018-03-30 NOTE — Progress Notes (Signed)
   S:    PCP: Geryl Rankins  Patient arrives in good spirits. Presents to the clinic BP re-check. Patient was referred by [facility] .  Patient was last seen by Primary Care Provider on 11/29/17.   Denies HA, chest pain, palpitations, dizziness.   PMH: hyperlipidemia, DM, HTN  Patient reports adherence with medications.  Current BP Medications include:  Lisinopril 10 mg (increased from 2.5 on 3/19 visit)  Dietary habits include:  - does try to limit dietary sodium intake - does try to keep to fresh and frozen fruits/vegetables; states that she stays away from canned foods - denies intake of sodas, fast food  Exercise habits include: - doesn't get out much d/t heat, concern for skin cancer  - states that she does try to stay moving within her apartment  - daughter present states that she encourages patient to go to [employer] with patient's son   Family / Social history:  - Mother: HTN  - Father: (deceased) DM - Brother: (deceased) DM, CAD - Sister: HTN - Tobacco: never smoker - Alcohol: denies use   ASCVD risk factors include: age, DM, HTN  ASCVD PCE score: 11.5%   Home BP readings: - Pt did not bring in home BP log  O:  OV BP: 108/73 taken after 5 minutes rest.  HR: 88  Last 3 Office BP readings: BP Readings from Last 3 Encounters:  03/15/18 130/72  03/02/18 126/76  02/23/18 120/60    BMET    Component Value Date/Time   NA 139 03/15/2018 1058   NA 139 09/15/2017 0800   K 4.2 03/15/2018 1058   K 3.8 09/15/2017 0800   CL 104 03/15/2018 1058   CO2 27 03/15/2018 1058   CO2 23 09/15/2017 0800   GLUCOSE 135 (H) 03/15/2018 1058   GLUCOSE 113 09/15/2017 0800   BUN 12 03/15/2018 1058   BUN 8.7 09/15/2017 0800   CREATININE 0.73 03/15/2018 1058   CREATININE 0.7 09/15/2017 0800   CALCIUM 9.6 03/15/2018 1058   CALCIUM 9.2 09/15/2017 0800   GFRNONAA >60 03/15/2018 1058   GFRAA >60 03/15/2018 1058    Renal function: CrCl cannot be calculated (Unknown ideal  weight.).  A/P: Hypertension longstanding diagnosed currently controlled on lisinopril 10 mg daily based on clinic BP of 108/73. BP Goal <130/80 mmHg. Patient is adherent with current medications.   -Continued lisinopril 10 mg daily.   -Counseled extensively on lifestyle modifications for blood pressure control including reduced dietary sodium, increased exercise, adequate sleep -Resources provided emphasizing DASH, sodium <1500 mg daily, 150 mins/wk aerobic exercise, low-cholesterol diet, and carbohydrate counting  Results reviewed and written information provided. Total time in face-to-face counseling 30 minutes.   F/U Clinic Visit 05/03/18 with PCP   Ricardo Jericho, PharmD Candidate [employer]  Class of 2020  Benard Halsted, PharmD, [place] [phone]

## 2018-03-30 NOTE — Patient Instructions (Addendum)
Diabetes Mellitus and Nutrition When you have diabetes (diabetes mellitus), it is very important to have healthy eating habits because your blood sugar (glucose) levels are greatly affected by what you eat and drink. Eating healthy foods in the appropriate amounts, at about the same times every day, can help you:  Control your blood glucose.  Lower your risk of heart disease.  Improve your blood pressure.  Reach or maintain a healthy weight.  Every person with diabetes is different, and each person has different needs for a meal plan. Your health care provider may recommend that you work with a diet and nutrition specialist (dietitian) to make a meal plan that is best for you. Your meal plan may vary depending on factors such as:  The calories you need.  The medicines you take.  Your weight.  Your blood glucose, blood pressure, and cholesterol levels.  Your activity level.  Other health conditions you have, such as heart or kidney disease.  How do carbohydrates affect me? Carbohydrates affect your blood glucose level more than any other type of food. Eating carbohydrates naturally increases the amount of glucose in your blood. Carbohydrate counting is a method for keeping track of how many carbohydrates you eat. Counting carbohydrates is important to keep your blood glucose at a healthy level, especially if you use insulin or take certain oral diabetes medicines. It is important to know how many carbohydrates you can safely have in each meal. This is different for every person. Your dietitian can help you calculate how many carbohydrates you should have at each meal and for snack. Foods that contain carbohydrates include:  Bread, cereal, rice, pasta, and crackers.  Potatoes and corn.  Peas, beans, and lentils.  Milk and yogurt.  Fruit and juice.  Desserts, such as cakes, cookies, ice cream, and candy.  How does alcohol affect me? Alcohol can cause a sudden decrease in  blood glucose (hypoglycemia), especially if you use insulin or take certain oral diabetes medicines. Hypoglycemia can be a life-threatening condition. Symptoms of hypoglycemia (sleepiness, dizziness, and confusion) are similar to symptoms of having too much alcohol. If your health care provider says that alcohol is safe for you, follow these guidelines:  Limit alcohol intake to no more than 1 drink per day for nonpregnant women and 2 drinks per day for men. One drink equals 12 oz of beer, 5 oz of wine, or 1 oz of hard liquor.  Do not drink on an empty stomach.  Keep yourself hydrated with water, diet soda, or unsweetened iced tea.  Keep in mind that regular soda, juice, and other mixers may contain a lot of sugar and must be counted as carbohydrates.  What are tips for following this plan? Reading food labels  Start by checking the serving size on the label. The amount of calories, carbohydrates, fats, and other nutrients listed on the label are based on one serving of the food. Many foods contain more than one serving per package.  Check the total grams (g) of carbohydrates in one serving. You can calculate the number of servings of carbohydrates in one serving by dividing the total carbohydrates by 15. For example, if a food has 30 g of total carbohydrates, it would be equal to 2 servings of carbohydrates.  Check the number of grams (g) of saturated and trans fats in one serving. Choose foods that have low or no amount of these fats.  Check the number of milligrams (mg) of sodium in one serving. Most people  should limit total sodium intake to less than 2,300 mg per day.  Always check the nutrition information of foods labeled as "low-fat" or "nonfat". These foods may be higher in added sugar or refined carbohydrates and should be avoided.  Talk to your dietitian to identify your daily goals for nutrients listed on the label. Shopping  Avoid buying canned, premade, or processed foods.  These foods tend to be high in fat, sodium, and added sugar.  Shop around the outside edge of the grocery store. This includes fresh fruits and vegetables, bulk grains, fresh meats, and fresh dairy. Cooking  Use low-heat cooking methods, such as baking, instead of high-heat cooking methods like deep frying.  Cook using healthy oils, such as olive, canola, or sunflower oil.  Avoid cooking with butter, cream, or high-fat meats. Meal planning  Eat meals and snacks regularly, preferably at the same times every day. Avoid going long periods of time without eating.  Eat foods high in fiber, such as fresh fruits, vegetables, beans, and whole grains. Talk to your dietitian about how many servings of carbohydrates you can eat at each meal.  Eat 4-6 ounces of lean protein each day, such as lean meat, chicken, fish, eggs, or tofu. 1 ounce is equal to 1 ounce of meat, chicken, or fish, 1 egg, or 1/4 cup of tofu.  Eat some foods each day that contain healthy fats, such as avocado, nuts, seeds, and fish. Lifestyle   Check your blood glucose regularly.  Exercise at least 30 minutes 5 or more days each week, or as told by your health care provider.  Take medicines as told by your health care provider.  Do not use any products that contain nicotine or tobacco, such as cigarettes and e-cigarettes. If you need help quitting, ask your health care provider.  Work with a Social worker or diabetes educator to identify strategies to manage stress and any emotional and social challenges. What are some questions to ask my health care provider?  Do I need to meet with a diabetes educator?  Do I need to meet with a dietitian?  What number can I call if I have questions?  When are the best times to check my blood glucose? Where to find more information:  American Diabetes Association: diabetes.org/food-and-fitness/food  Academy of Nutrition and Dietetics:  PokerClues.dk  [employer] of Diabetes and Digestive and Kidney Diseases (NIH): ContactWire.be Summary  A healthy meal plan will help you control your blood glucose and maintain a healthy lifestyle.  Working with a diet and nutrition specialist (dietitian) can help you make a meal plan that is best for you.  Keep in mind that carbohydrates and alcohol have immediate effects on your blood glucose levels. It is important to count carbohydrates and to use alcohol carefully. This information is not intended to replace advice given to you by your health care provider. Make sure you discuss any questions you have with your health care provider. Document Released: 05/28/2005 Document Revised: 10/05/2016 Document Reviewed: 10/05/2016 Elsevier Interactive Patient Education  2018 [employer].    Cholesterol Cholesterol is a white, waxy, fat-like substance that is needed by the human body in small amounts. The liver makes all the cholesterol we need. Cholesterol is carried from the liver by the blood through the blood vessels. Deposits of cholesterol (plaques) may build up on blood vessel (artery) walls. Plaques make the arteries narrower and stiffer. Cholesterol plaques increase the risk for heart attack and stroke. You cannot feel your cholesterol level  even if it is very high. The only way to know that it is high is to have a blood test. Once you know your cholesterol levels, you should keep a record of the test results. Work with your health care provider to keep your levels in the desired range. What do the results mean?  Total cholesterol is a rough measure of all the cholesterol in your blood.  LDL (low-density lipoprotein) is the "bad" cholesterol. This is the type that causes plaque to build up on the artery walls. You want this level to be low.  HDL (high-density  lipoprotein) is the "good" cholesterol because it cleans the arteries and carries the LDL away. You want this level to be high.  Triglycerides are fat that the body can either burn for energy or store. High levels are closely linked to heart disease. What are the desired levels of cholesterol?  Total cholesterol below 200.  LDL below 100 for people who are at risk, below 70 for people at very high risk.  HDL above 40 is good. A level of 60 or higher is considered to be protective against heart disease.  Triglycerides below 150. How can I lower my cholesterol? Diet Follow your diet program as told by your health care provider.  Choose fish or white meat chicken and [place], roasted or baked. Limit fatty cuts of red meat, fried foods, and processed meats, such as sausage and lunch meats.  Eat lots of fresh fruits and vegetables.  Choose whole grains, beans, pasta, potatoes, and cereals.  Choose olive oil, corn oil, or canola oil, and use only small amounts.  Avoid butter, mayonnaise, shortening, or palm kernel oils.  Avoid foods with trans fats.  Drink skim or nonfat milk and eat low-fat or nonfat yogurt and cheeses. Avoid whole milk, cream, ice cream, egg yolks, and full-fat cheeses.  Healthier desserts include angel food cake, ginger snaps, animal crackers, hard candy, popsicles, and low-fat or nonfat frozen yogurt. Avoid pastries, cakes, pies, and cookies.  Exercise  Follow your exercise program as told by your health care provider. A regular program: ? Helps to decrease LDL and raise HDL. ? Helps with weight control.  Do things that increase your activity level, such as gardening, walking, and taking the stairs.  Ask your health care provider about ways that you can be more active in your daily life.  Medicine  Take over-the-counter and prescription medicines only as told by your health care provider. ? Medicine may be prescribed by your health care provider to help  lower cholesterol and decrease the risk for heart disease. This is usually done if diet and exercise have failed to bring down cholesterol levels. ? If you have several risk factors, you may need medicine even if your levels are normal.  This information is not intended to replace advice given to you by your health care provider. Make sure you discuss any questions you have with your health care provider. Document Released: 05/26/2001 Document Revised: 03/28/2016 Document Reviewed: 02/29/2016 Elsevier Interactive Patient Education  2018 [place] DASH stands for "Dietary Approaches to Stop Hypertension." The DASH eating plan is a healthy eating plan that has been shown to reduce high blood pressure (hypertension). It may also reduce your risk for type 2 diabetes, heart disease, and stroke. The DASH eating plan may also help with weight loss. What are tips for following this plan? General guidelines  Avoid eating more than 2,300 mg (milligrams) of salt (sodium)  a day. If you have hypertension, you may need to reduce your sodium intake to 1,500 mg a day.  Limit alcohol intake to no more than 1 drink a day for nonpregnant women and 2 drinks a day for men. One drink equals 12 oz of beer, 5 oz of wine, or 1 oz of hard liquor.  Work with your health care provider to maintain a healthy body weight or to lose weight. Ask what an ideal weight is for you.  Get at least 30 minutes of exercise that causes your heart to beat faster (aerobic exercise) most days of the week. Activities may include walking, swimming, or biking.  Work with your health care provider or diet and nutrition specialist (dietitian) to adjust your eating plan to your individual calorie needs. Reading food labels  Check food labels for the amount of sodium per serving. Choose foods with less than 5 percent of the Daily Value of sodium. Generally, foods with less than 300 mg of sodium per serving fit into this  eating plan.  To find whole grains, look for the word "whole" as the first word in the ingredient list. Shopping  Buy products labeled as "low-sodium" or "no salt added."  Buy fresh foods. Avoid canned foods and premade or frozen meals. Cooking  Avoid adding salt when cooking. Use salt-free seasonings or herbs instead of table salt or sea salt. Check with your health care provider or pharmacist before using salt substitutes.  Do not fry foods. Cook foods using healthy methods such as baking, boiling, grilling, and broiling instead.  Cook with heart-healthy oils, such as olive, canola, soybean, or sunflower oil. Meal planning   Eat a balanced diet that includes: ? 5 or more servings of fruits and vegetables each day. At each meal, try to fill half of your plate with fruits and vegetables. ? Up to 6-8 servings of whole grains each day. ? Less than 6 oz of lean meat, poultry, or fish each day. A 3-oz serving of meat is about the same size as a deck of cards. One egg equals 1 oz. ? 2 servings of low-fat dairy each day. ? A serving of nuts, seeds, or beans 5 times each week. ? Heart-healthy fats. Healthy fats called Omega-3 fatty acids are found in foods such as flaxseeds and coldwater fish, like sardines, salmon, and mackerel.  Limit how much you eat of the following: ? Canned or prepackaged foods. ? Food that is high in trans fat, such as fried foods. ? Food that is high in saturated fat, such as fatty meat. ? Sweets, desserts, sugary drinks, and other foods with added sugar. ? Full-fat dairy products.  Do not salt foods before eating.  Try to eat at least 2 vegetarian meals each week.  Eat more home-cooked food and less restaurant, buffet, and fast food.  When eating at a restaurant, ask that your food be prepared with less salt or no salt, if possible. What foods are recommended? The items listed may not be a complete list. Talk with your dietitian about what dietary choices  are best for you. Grains Whole-grain or whole-wheat bread. Whole-grain or whole-wheat pasta. Brown rice. [place]. Bulgur. Whole-grain and low-sodium cereals. Pita bread. Low-fat, low-sodium crackers. Whole-wheat flour tortillas. Vegetables Fresh or frozen vegetables (raw, steamed, roasted, or grilled). Low-sodium or reduced-sodium tomato and vegetable juice. Low-sodium or reduced-sodium tomato sauce and tomato paste. Low-sodium or reduced-sodium canned vegetables. Fruits All fresh, dried, or frozen fruit. Canned fruit in natural  juice (without added sugar). Meat and other protein foods Skinless chicken or [place]. Ground chicken or [place]. Pork with fat trimmed off. Fish and seafood. Egg whites. Dried beans, peas, or lentils. Unsalted nuts, nut butters, and seeds. Unsalted canned beans. Lean cuts of beef with fat trimmed off. Low-sodium, lean deli meat. Dairy Low-fat (1%) or fat-free (skim) milk. Fat-free, low-fat, or reduced-fat cheeses. Nonfat, low-sodium ricotta or cottage cheese. Low-fat or nonfat yogurt. Low-fat, low-sodium cheese. Fats and oils Soft margarine without trans fats. Vegetable oil. Low-fat, reduced-fat, or light mayonnaise and salad dressings (reduced-sodium). Canola, safflower, olive, soybean, and sunflower oils. Avocado. Seasoning and other foods Herbs. Spices. Seasoning mixes without salt. Unsalted popcorn and pretzels. Fat-free sweets. What foods are not recommended? The items listed may not be a complete list. Talk with your dietitian about what dietary choices are best for you. Grains Baked goods made with fat, such as croissants, muffins, or some breads. Dry pasta or rice meal packs. Vegetables Creamed or fried vegetables. Vegetables in a cheese sauce. Regular canned vegetables (not low-sodium or reduced-sodium). Regular canned tomato sauce and paste (not low-sodium or reduced-sodium). Regular tomato and vegetable juice (not low-sodium or reduced-sodium). Angie Fava.  Olives. Fruits Canned fruit in a light or heavy syrup. Fried fruit. Fruit in cream or butter sauce. Meat and other protein foods Fatty cuts of meat. Ribs. Fried meat. Berniece Salines. Sausage. Bologna and other processed lunch meats. Salami. Fatback. Hotdogs. Bratwurst. Salted nuts and seeds. Canned beans with added salt. Canned or smoked fish. Whole eggs or egg yolks. Chicken or [place] with skin. Dairy Whole or 2% milk, cream, and half-and-half. Whole or full-fat cream cheese. Whole-fat or sweetened yogurt. Full-fat cheese. Nondairy creamers. Whipped toppings. Processed cheese and cheese spreads. Fats and oils Butter. Stick margarine. Lard. Shortening. Ghee. Bacon fat. Tropical oils, such as coconut, palm kernel, or palm oil. Seasoning and other foods Salted popcorn and pretzels. Onion salt, garlic salt, seasoned salt, table salt, and sea salt. Worcestershire sauce. Tartar sauce. Barbecue sauce. Teriyaki sauce. Soy sauce, including reduced-sodium. Steak sauce. Canned and packaged gravies. Fish sauce. Oyster sauce. Cocktail sauce. Horseradish that you find on the shelf. Ketchup. Mustard. Meat flavorings and tenderizers. Bouillon cubes. Hot sauce and Tabasco sauce. Premade or packaged marinades. Premade or packaged taco seasonings. Relishes. Regular salad dressings. Where to find more information:  National Heart, Lung, and [place]: https://wilson-eaton.com/  American Heart Association: www.heart.org Summary  The DASH eating plan is a healthy eating plan that has been shown to reduce high blood pressure (hypertension). It may also reduce your risk for type 2 diabetes, heart disease, and stroke.  With the DASH eating plan, you should limit salt (sodium) intake to 2,300 mg a day. If you have hypertension, you may need to reduce your sodium intake to 1,500 mg a day.  When on the DASH eating plan, aim to eat more fresh fruits and vegetables, whole grains, lean proteins, low-fat dairy, and heart-healthy  fats.  Work with your health care provider or diet and nutrition specialist (dietitian) to adjust your eating plan to your individual calorie needs. This information is not intended to replace advice given to you by your health care provider. Make sure you discuss any questions you have with your health care provider. Document Released: 08/20/2011 Document Revised: 08/24/2016 Document Reviewed: 08/24/2016 Elsevier Interactive Patient Education  [employer].   Thank you for coming to see [place] today. Please do the following:   1. Continue taking your blood pressure medication every day.  Try to monitor pressure at home if you can.   2. Your cholesterol medication helps keep your cholesterol low and protects your heart and blood vessels. Because of your diabetes, it is very important to keep your heart healthy.   Goals for you are as follows:   Blood pressure: Top number under 130. Bottom number under 80.  Cholesterol: We look at many things but one of the most important labs to monitor is your "bad" cholesterol (LDL). We like to keep this number under 100 or 70 depending on your risk.   Blood sugar: We look at blood sugar when you are fasting and after your eat. Blood sugar in the morning before you eat anything should be between 80 and 130. Blood sugar 2 hours after eating should be less than 180.   My tips:   1. Eat LOTS of fresh fruits and vegetables and decrease the amount of red meat you eat.   2. Increase the amount of fiber in your diet. Read those nutrition labels. According to the American Heart Association, you should strive to get around 25 grams of fiber a day. Examples of high fiber foods include, grain bread, oatmeal, apples, avocados, berries, beans, almonds, and/or walnuts. Increasing fiber can help lower cholesterol and improve health.   3. Try to limit excessively sweet foods or processed foods. Any "sweet" such as ice cream, candy, sodas, sweet teat, and others  directly increase blood sugar.   4. Try to get as much physical activity as possible. You can do it!

## 2018-04-01 ENCOUNTER — Telehealth: Payer: Self-pay

## 2018-04-01 NOTE — Telephone Encounter (Signed)
Returned pt's call - she verbalized that someone called her about a form but she is not sure what form they are talking about.  Let her know that I did not call her and I don't see any notes where any one from [facility] called her.  Pt said might have been a mistake then.  No other needs per pt at this time.

## 2018-04-05 ENCOUNTER — Inpatient Hospital Stay: Payer: Medicaid Other

## 2018-04-05 VITALS — BP 113/68 | HR 81 | Temp 98.0°F | Resp 18

## 2018-04-05 DIAGNOSIS — Z171 Estrogen receptor negative status [ER-]: Secondary | ICD-10-CM

## 2018-04-05 DIAGNOSIS — C50512 Malignant neoplasm of lower-outer quadrant of left female breast: Secondary | ICD-10-CM

## 2018-04-05 DIAGNOSIS — C5701 Malignant neoplasm of right fallopian tube: Secondary | ICD-10-CM

## 2018-04-05 DIAGNOSIS — Z5112 Encounter for antineoplastic immunotherapy: Secondary | ICD-10-CM | POA: Diagnosis not present

## 2018-04-05 DIAGNOSIS — Z95828 Presence of other vascular implants and grafts: Secondary | ICD-10-CM

## 2018-04-05 LAB — CMP (CANCER CENTER ONLY)
ALK PHOS: 93 U/L (ref 38–126)
ALT: 24 U/L (ref 0–44)
ANION GAP: 11 (ref 5–15)
AST: 16 U/L (ref 15–41)
Albumin: 3.8 g/dL (ref 3.5–5.0)
BUN: 10 mg/dL (ref 6–20)
CALCIUM: 9.6 mg/dL (ref 8.9–10.3)
CO2: 25 mmol/L (ref 22–32)
CREATININE: 0.7 mg/dL (ref 0.44–1.00)
Chloride: 105 mmol/L (ref 98–111)
Glucose, Bld: 109 mg/dL — ABNORMAL HIGH (ref 70–99)
Potassium: 3.9 mmol/L (ref 3.5–5.1)
Sodium: 141 mmol/L (ref 135–145)
Total Bilirubin: 0.3 mg/dL (ref 0.3–1.2)
Total Protein: 7.6 g/dL (ref 6.5–8.1)

## 2018-04-05 LAB — CBC WITH DIFFERENTIAL (CANCER CENTER ONLY)
BASOS ABS: 0 10*3/uL (ref 0.0–0.1)
BASOS PCT: 0 %
EOS ABS: 0 10*3/uL (ref 0.0–0.5)
Eosinophils Relative: 0 %
HCT: 31.1 % — ABNORMAL LOW (ref 34.8–46.6)
HEMOGLOBIN: 10.4 g/dL — AB (ref 11.6–15.9)
Lymphocytes Relative: 32 %
Lymphs Abs: 2.4 10*3/uL (ref 0.9–3.3)
MCH: 32.1 pg (ref 25.1–34.0)
MCHC: 33.4 g/dL (ref 31.5–36.0)
MCV: 96 fL (ref 79.5–101.0)
Monocytes Absolute: 0.7 10*3/uL (ref 0.1–0.9)
Monocytes Relative: 9 %
NEUTROS PCT: 59 %
Neutro Abs: 4.4 10*3/uL (ref 1.5–6.5)
Platelet Count: 262 10*3/uL (ref 145–400)
RBC: 3.24 MIL/uL — AB (ref 3.70–5.45)
RDW: 17.1 % — ABNORMAL HIGH (ref 11.2–14.5)
WBC: 7.4 10*3/uL (ref 3.9–10.3)

## 2018-04-05 MED ORDER — SODIUM CHLORIDE 0.9% FLUSH
10.0000 mL | INTRAVENOUS | Status: DC | PRN
  Administered 2018-04-05: 10 mL via INTRAVENOUS
  Filled 2018-04-05: qty 10

## 2018-04-05 MED ORDER — SODIUM CHLORIDE 0.9 % IV SOLN
Freq: Once | INTRAVENOUS | Status: AC
  Administered 2018-04-05: 11:00:00 via INTRAVENOUS

## 2018-04-05 MED ORDER — TRASTUZUMAB CHEMO 150 MG IV SOLR
450.0000 mg | Freq: Once | INTRAVENOUS | Status: AC
  Administered 2018-04-05: 450 mg via INTRAVENOUS
  Filled 2018-04-05: qty 21.43

## 2018-04-05 MED ORDER — HEPARIN SOD (PORK) LOCK FLUSH 100 UNIT/ML IV SOLN
500.0000 [IU] | Freq: Once | INTRAVENOUS | Status: AC | PRN
Start: 2018-04-05 — End: 2018-04-05
  Administered 2018-04-05: 500 [IU]
  Filled 2018-04-05: qty 5

## 2018-04-05 MED ORDER — SODIUM CHLORIDE 0.9% FLUSH
10.0000 mL | INTRAVENOUS | Status: DC | PRN
  Administered 2018-04-05: 10 mL
  Filled 2018-04-05: qty 10

## 2018-04-05 MED ORDER — ACETAMINOPHEN 325 MG PO TABS
650.0000 mg | ORAL_TABLET | Freq: Once | ORAL | Status: AC
  Administered 2018-04-05: 650 mg via ORAL

## 2018-04-05 MED ORDER — ACETAMINOPHEN 325 MG PO TABS
ORAL_TABLET | ORAL | Status: AC
  Filled 2018-04-05: qty 2

## 2018-04-05 MED ORDER — DIPHENHYDRAMINE HCL 50 MG/ML IJ SOLN
50.0000 mg | Freq: Once | INTRAMUSCULAR | Status: AC
  Administered 2018-04-05: 50 mg via INTRAVENOUS

## 2018-04-05 MED ORDER — DIPHENHYDRAMINE HCL 50 MG/ML IJ SOLN
INTRAMUSCULAR | Status: AC
  Filled 2018-04-05: qty 1

## 2018-04-05 NOTE — Patient Instructions (Signed)
[facility] Discharge Instructions for Patients Receiving Chemotherapy  Today you received the following chemotherapy agents Herceptin  To help prevent nausea and vomiting after your treatment, we encourage you to take your nausea medication as directed   If you develop nausea and vomiting that is not controlled by your nausea medication, call the clinic.   BELOW ARE SYMPTOMS THAT SHOULD BE REPORTED IMMEDIATELY:  *FEVER GREATER THAN 100.5 F  *CHILLS WITH OR WITHOUT FEVER  NAUSEA AND VOMITING THAT IS NOT CONTROLLED WITH YOUR NAUSEA MEDICATION  *UNUSUAL SHORTNESS OF BREATH  *UNUSUAL BRUISING OR BLEEDING  TENDERNESS IN MOUTH AND THROAT WITH OR WITHOUT PRESENCE OF ULCERS  *URINARY PROBLEMS  *BOWEL PROBLEMS  UNUSUAL RASH Items with * indicate a potential emergency and should be followed up as soon as possible.  Feel free to call the clinic should you have any questions or concerns. The clinic phone number is [phone].  Please show the CHEMO ALERT CARD at check-in to the Emergency Department and triage nurse.   

## 2018-04-25 ENCOUNTER — Ambulatory Visit: Admission: RE | Admit: 2018-04-25 | Payer: Medicaid Other | Source: Ambulatory Visit

## 2018-04-25 ENCOUNTER — Ambulatory Visit
Admission: RE | Admit: 2018-04-25 | Discharge: 2018-04-25 | Disposition: A | Discharge status: Home / Self Care | Payer: Medicaid Other | Source: Ambulatory Visit | Attending: Hematology and Oncology | Admitting: Hematology and Oncology

## 2018-04-25 DIAGNOSIS — Z853 Personal history of malignant neoplasm of breast: Secondary | ICD-10-CM

## 2018-04-26 ENCOUNTER — Inpatient Hospital Stay: Payer: Medicaid Other | Admitting: *Deleted

## 2018-04-26 ENCOUNTER — Telehealth: Payer: Self-pay | Admitting: Hematology and Oncology

## 2018-04-26 ENCOUNTER — Inpatient Hospital Stay: Payer: Medicaid Other | Attending: Hematology and Oncology

## 2018-04-26 ENCOUNTER — Inpatient Hospital Stay: Payer: Medicaid Other

## 2018-04-26 ENCOUNTER — Inpatient Hospital Stay (HOSPITAL_BASED_OUTPATIENT_CLINIC_OR_DEPARTMENT_OTHER): Payer: Medicaid Other | Admitting: Hematology and Oncology

## 2018-04-26 DIAGNOSIS — L271 Localized skin eruption due to drugs and medicaments taken internally: Secondary | ICD-10-CM

## 2018-04-26 DIAGNOSIS — Z90710 Acquired absence of both cervix and uterus: Secondary | ICD-10-CM | POA: Diagnosis not present

## 2018-04-26 DIAGNOSIS — C50512 Malignant neoplasm of lower-outer quadrant of left female breast: Secondary | ICD-10-CM

## 2018-04-26 DIAGNOSIS — C5701 Malignant neoplasm of right fallopian tube: Secondary | ICD-10-CM | POA: Insufficient documentation

## 2018-04-26 DIAGNOSIS — Z171 Estrogen receptor negative status [ER-]: Secondary | ICD-10-CM

## 2018-04-26 DIAGNOSIS — Z95828 Presence of other vascular implants and grafts: Secondary | ICD-10-CM

## 2018-04-26 DIAGNOSIS — Z5112 Encounter for antineoplastic immunotherapy: Secondary | ICD-10-CM | POA: Insufficient documentation

## 2018-04-26 LAB — CMP (CANCER CENTER ONLY)
ALT: 22 U/L (ref 0–44)
AST: 17 U/L (ref 15–41)
Albumin: 3.6 g/dL (ref 3.5–5.0)
Alkaline Phosphatase: 92 U/L (ref 38–126)
Anion gap: 12 (ref 5–15)
BUN: 7 mg/dL (ref 6–20)
CHLORIDE: 106 mmol/L (ref 98–111)
CO2: 23 mmol/L (ref 22–32)
CREATININE: 0.72 mg/dL (ref 0.44–1.00)
Calcium: 9.2 mg/dL (ref 8.9–10.3)
GFR, Estimated: >60 mL/min (ref >60)
Glucose, Bld: 111 mg/dL — ABNORMAL HIGH (ref 70–99)
Potassium: 3.9 mmol/L (ref 3.5–5.1)
SODIUM: 141 mmol/L (ref 135–145)
Total Bilirubin: 0.3 mg/dL (ref 0.3–1.2)
Total Protein: 7.4 g/dL (ref 6.5–8.1)

## 2018-04-26 LAB — CBC WITH DIFFERENTIAL (CANCER CENTER ONLY)
Basophils Absolute: 0 10*3/uL (ref 0.0–0.1)
Basophils Relative: 0 %
EOS ABS: 0.1 10*3/uL (ref 0.0–0.5)
Eosinophils Relative: 1 %
HEMATOCRIT: 30.3 % — AB (ref 34.8–46.6)
Hemoglobin: 10.4 g/dL — ABNORMAL LOW (ref 11.6–15.9)
LYMPHS ABS: 1.8 10*3/uL (ref 0.9–3.3)
Lymphocytes Relative: 23 %
MCH: 33.3 pg (ref 25.1–34.0)
MCHC: 34.3 g/dL (ref 31.5–36.0)
MCV: 97.2 fL (ref 79.5–101.0)
MONOS PCT: 9 %
Monocytes Absolute: 0.7 10*3/uL (ref 0.1–0.9)
Neutro Abs: 5.2 10*3/uL (ref 1.5–6.5)
Neutrophils Relative %: 67 %
Platelet Count: 245 10*3/uL (ref 145–400)
RBC: 3.11 MIL/uL — ABNORMAL LOW (ref 3.70–5.45)
RDW: 16.9 % — ABNORMAL HIGH (ref 11.2–14.5)
WBC Count: 7.8 10*3/uL (ref 3.9–10.3)

## 2018-04-26 MED ORDER — SODIUM CHLORIDE 0.9% FLUSH
10.0000 mL | INTRAVENOUS | Status: DC | PRN
  Filled 2018-04-26: qty 10

## 2018-04-26 MED ORDER — CLINDAMYCIN PHOSPHATE 1 % EX GEL: Freq: Two times a day (BID) | CUTANEOUS | 0 refills | Status: DC

## 2018-04-26 MED ORDER — DIPHENHYDRAMINE HCL 50 MG/ML IJ SOLN
INTRAMUSCULAR | Status: AC
  Filled 2018-04-26: qty 1

## 2018-04-26 MED ORDER — DIPHENHYDRAMINE HCL 50 MG/ML IJ SOLN
50.0000 mg | Freq: Once | INTRAMUSCULAR | Status: AC
Start: 2018-04-26 — End: 2018-04-26
  Administered 2018-04-26: 50 mg via INTRAVENOUS

## 2018-04-26 MED ORDER — TRASTUZUMAB CHEMO 150 MG IV SOLR
450.0000 mg | Freq: Once | INTRAVENOUS | Status: AC
  Administered 2018-04-26: 450 mg via INTRAVENOUS
  Filled 2018-04-26: qty 21.43

## 2018-04-26 MED ORDER — ACETAMINOPHEN 325 MG PO TABS
ORAL_TABLET | ORAL | Status: AC
  Filled 2018-04-26: qty 2

## 2018-04-26 MED ORDER — ACETAMINOPHEN 325 MG PO TABS
650.0000 mg | ORAL_TABLET | Freq: Once | ORAL | Status: AC
  Administered 2018-04-26: 650 mg via ORAL

## 2018-04-26 MED ORDER — SODIUM CHLORIDE 0.9% FLUSH
10.0000 mL | INTRAVENOUS | Status: DC | PRN
  Administered 2018-04-26 (×2): 10 mL via INTRAVENOUS
  Filled 2018-04-26: qty 10

## 2018-04-26 MED ORDER — SODIUM CHLORIDE 0.9 % IV SOLN
Freq: Once | INTRAVENOUS | Status: AC
  Administered 2018-04-26: 12:00:00 via INTRAVENOUS
  Filled 2018-04-26: qty 250

## 2018-04-26 MED ORDER — HEPARIN SOD (PORK) LOCK FLUSH 100 UNIT/ML IV SOLN
500.0000 [IU] | Freq: Once | INTRAVENOUS | Status: AC | PRN
  Administered 2018-04-26: 500 [IU]
  Filled 2018-04-26: qty 5

## 2018-04-26 NOTE — Assessment & Plan Note (Signed)
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  Today is cycle 3 (Taxol discontinued after cycle 1) Last echo on 11/18/2017: EF 55-60%, followed by cardiology. 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  Taxol carbo given on cycle 1, Taxol discontinued and carboplatin discontinued after cycle 3: On Herceptin maintenance  Chemo toxicities: 1.Severe rash on the face and chest as well as a scalp: We discontinued Taxol from cycle 2.  2.hair loss and bone pain from Neulasta: Neulasta discontinued because Taxol was stopped 3.  Carboplatin discontinued due to intolerance No toxicities from Herceptin  Return to clinic in 3 weeks for Herceptin maintenance therapy alone.  I will see the patient in every 6 weeks

## 2018-04-26 NOTE — Progress Notes (Signed)
Patient Care Team: [name], NP as PCP - General (Nurse Practitioner) Excell Seltzer, MD as Consulting Physician (General Surgery) [name], MD as Consulting Physician (Hematology and Oncology) Kyung Rudd, MD as Consulting Physician (Radiation Oncology)  DIAGNOSIS:  Encounter Diagnosis  Name Primary?  . Malignant neoplasm of lower-outer quadrant of left breast of female, estrogen receptor negative ([name])     SUMMARY OF ONCOLOGIC HISTORY:   Malignant neoplasm of lower-outer quadrant of left breast of female, estrogen receptor negative ([name])   05/11/2017 Initial Diagnosis    Left breast biopsy 3:30 position 6 cm from nipple: IDC with DCIS, lymphovascular invasion present, grade 2-3, ER 0%, PR 0%, HER-2 positive ratio 2.28, Ki-67 20%, 1 cm lesion left breast, T1b N0 stage IA clinical stage    06/09/2017 Surgery    Left lumpectomy: IDC grade 3, 1.2 cm, DCIS is present, margins negative, 0/6 lymph nodes negative, ER 0%, PR 0%, HER-2 positive ratio 2.28, Ki-67 20%, T1 BN 0 stage IA    07/09/2017 -  Adjuvant Chemotherapy    Taxol/Herceptin completed on 09/29/2017, went on to receive every 3 week adjuvant Herceptin on 10/20/2017    12/21/2017 -  Chemotherapy    The patient had palonosetron (ALOXI) injection 0.25 mg, 0.25 mg, Intravenous,  Once, 3 of 3 cycles Administration: 0.25 mg (01/11/2018), 0.25 mg (02/01/2018), 0.25 mg (02/22/2018) pegfilgrastim (NEULASTA ONPRO KIT) injection 6 mg, 6 mg, Subcutaneous, Once, 1 of 1 cycle Administration: 6 mg (01/11/2018) CARBOplatin (PARAPLATIN) 580 mg in sodium chloride 0.9 % 250 mL chemo infusion, 580 mg (100 % of original dose 575 mg), Intravenous,  Once, 3 of 3 cycles Dose modification:   (original dose 575 mg, Cycle 1) Administration: 580 mg (01/11/2018), 580 mg (02/01/2018), 580 mg (02/22/2018) PACLitaxel (TAXOL) 306 mg in sodium chloride 0.9 % 500 mL chemo infusion (> 57m/m2), 175 mg/m2 = 306 mg, Intravenous,  Once, 1 of 1  cycle Administration: 306 mg (01/11/2018) fosaprepitant (EMEND) 150 mg, dexamethasone (DECADRON) 12 mg in sodium chloride 0.9 % 145 mL IVPB, , Intravenous,  Once, 3 of 3 cycles Administration:  (01/11/2018),  (02/01/2018),  (02/22/2018)  for chemotherapy treatment.     02/15/2018 Genetic Testing    The Common Hereditary Cancer Panel offered by [facility] includes sequencing and/or deletion duplication testing of the following 47 genes: APC, ATM, AXIN2, BARD1, BMPR1A, BRCA1, BRCA2, BRIP1, CDH1, CDKN2A (p14ARF), CDKN2A (p16INK4a), CKD4, CHEK2, CTNNA1, DICER1, EPCAM (Deletion/duplication testing only), GREM1 (promoter region deletion/duplication testing only), KIT, MEN1, MLH1, MSH2, MSH3, MSH6, MUTYH, NBN, NF1, NHTL1, PALB2, PDGFRA, PMS2, POLD1, POLE, PTEN, RAD50, RAD51C, RAD51D, SDHB, SDHC, SDHD, SMAD4, SMARCA4. STK11, TP53, TSC1, TSC2, and VHL.  The following genes were evaluated for sequence changes only: SDHA and HOXB13 c.251G>A variant only.   Results: POSITIVE. 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  She completes Herceptin October 15.  She has acneform rash on the  face which is bothering her.  Other than that she is tolerating Herceptin extremely well.  REVIEW OF SYSTEMS:   Constitutional: Denies fevers, chills or abnormal weight loss Eyes: Denies blurriness of vision Ears,  nose, mouth, throat, and face: Denies mucositis or sore throat Respiratory: Denies cough, dyspnea or wheezes Cardiovascular: Denies palpitation, chest discomfort Gastrointestinal:  Denies nausea, heartburn or change in bowel habits Skin: Acneform rash on the face Lymphatics: Denies new lymphadenopathy or easy bruising Neurological:Denies numbness, tingling or new weaknesses Behavioral/Psych: Mood is stable, no new changes  Extremities: No lower extremity edema   All other systems were reviewed with the patient and are negative.  I have reviewed the past medical history, past surgical history, social history and family history with the patient and they are unchanged from previous note.  ALLERGIES:  has No Known Allergies.  MEDICATIONS:  Current Outpatient Medications  Medication Sig Dispense Refill  . atorvastatin (LIPITOR) 40 MG tablet Take 1 tablet (40 mg total) by mouth daily. 90 tablet 1  . Blood Glucose Monitoring Suppl (ACCU-CHEK AVIVA PLUS) W/DEVICE KIT Check Blood sugar AM fasting and before dinner 1 kit 0  . cholecalciferol (VITAMIN D) 1000 units tablet Take 1,000 Units by mouth daily.    Marland Kitchen doxycycline (VIBRA-TABS) 100 MG tablet Take 1 tablet (100 mg total) by mouth 2 (two) times daily. 20 tablet 0  . ferrous sulfate 325 (65 FE) MG tablet Take 1 tablet (325 mg total) by mouth daily before breakfast. 90 tablet 1  . glucose blood (ACCU-CHEK AVIVA) test strip Use as instructed 100 each 12  . Lancet Devices (ACCU-CHEK SOFTCLIX) lancets Use as instructed 1 each 0  . lidocaine-prilocaine (EMLA) cream Apply to affected area once 30 g 3  . lisinopril (PRINIVIL,ZESTRIL) 10 MG tablet Take 1 tablet (10 mg total) by mouth daily. 30 tablet 1  . metFORMIN (GLUCOPHAGE XR) 500 MG 24 hr tablet Take 2 tablets (1,000 mg total) by mouth daily with breakfast. 180 tablet 0  . mupirocin cream (BACTROBAN) 2 % Apply 1 application topically 4 (four) times daily. (Patient not taking: Reported on 03/02/2018)  30 g 2  . Omega-3 Fatty Acids (FISH OIL OMEGA-3 PO) Take 1 capsule by mouth daily.     . TRUEPLUS LANCETS 28G MISC USE AS DIRECTED BY PHYSICIAN 100 each 0   Current Facility-Administered Medications  Medication Dose Route Frequency Provider Last Rate Last Dose  . 0.9 %  sodium chloride infusion  500 mL Intravenous Once Nandigam, Venia Minks, MD       Facility-Administered Medications Ordered in Other Visits  Medication Dose Route Frequency Provider Last Rate Last Dose  . sodium chloride flush (NS) 0.9 % injection 10 mL  10 mL Intravenous PRN [name], MD   10 mL at 04/26/18 0940    PHYSICAL EXAMINATION: ECOG PERFORMANCE STATUS: 1 - Symptomatic but completely ambulatory  Vitals:   04/26/18 0954  BP: 130/69  Pulse: 71  Resp: 18  Temp: 97.9 F (36.6 C)  SpO2: 100%   Filed Weights   04/26/18 0954  Weight: 159 lb 9.6 oz (72.4 kg)    GENERAL:alert, no distress and comfortable SKIN: skin color, texture, turgor are normal, no rashes or significant lesions EYES: normal, Conjunctiva are pink and non-injected, sclera clear OROPHARYNX:no exudate, no erythema and lips, buccal mucosa, and tongue normal  NECK: supple, thyroid normal size, non-tender, without nodularity LYMPH:  no palpable lymphadenopathy in the cervical, axillary or inguinal LUNGS: clear to auscultation and percussion with normal  breathing effort HEART: regular rate & rhythm and no murmurs and no lower extremity edema ABDOMEN:abdomen soft, non-tender and normal bowel sounds MUSCULOSKELETAL:no cyanosis of digits and no clubbing  NEURO: alert & oriented x 3 with fluent speech, no focal motor/sensory deficits EXTREMITIES: No lower extremity edema   LABORATORY DATA:  I have reviewed the data as listed CMP Latest Ref Rng & Units 04/05/2018 03/15/2018 02/22/2018  Glucose 70 - 99 mg/dL 109(H) 135(H) 126  BUN 6 - 20 mg/dL _0 Creatinine 0.44 - 1.00 mg/dL 0.70 0.73 0.75  Sodium 135 - 145 mmol/L 141 139 140  Potassium  3.5 - 5.1 mmol/L 3.9 4.2 4.1  Chloride 98 - 111 mmol/L 105 104 106  CO2 22 - 32 mmol/L _1 Calcium 8.9 - 10.3 mg/dL 9.6 9.6 9.5  Total Protein 6.5 - 8.1 g/dL 7.6 7.6 7.5  Total Bilirubin 0.3 - 1.2 mg/dL 0.3 0.3 <0.2(L)  Alkaline Phos 38 - 126 U/L 93 101 89  AST 15 - 41 U/L _2 ALT 0 - 44 U/L _3 Lab Results  Component Value Date   WBC 7.8 04/26/2018   HGB 10.4 (L) 04/26/2018   HCT 30.3 (L) 04/26/2018   MCV 97.2 04/26/2018   PLT 245 04/26/2018   NEUTROABS 5.2 04/26/2018    ASSESSMENT & PLAN:  Malignant neoplasm of lower-outer quadrant of left breast of female, estrogen receptor negative ([place]) Hysterectomy with fallopian tube surgery: 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  Today is cycle 3 (Taxol discontinued after cycle 1) Last echo on 11/18/2017: EF 55-60%, followed by cardiology. 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  Taxol carbo given on cycle 1, Taxol discontinued and carboplatin discontinued after cycle 3: On Herceptin maintenance  Chemo toxicities: 1.Severe rash on the face and chest as well as a scalp: We discontinued Taxol from cycle 2.  2.hair loss and bone pain from Neulasta: Neulasta discontinued because Taxol was stopped 3.  Carboplatin discontinued due to intolerance No toxicities from Herceptin She needs an echocardiogram next month  Acneform rash on the face: I prescribed her clindamycin  1% topical gel Return to clinic in 3 weeks for Herceptin maintenance therapy alone.  I will see the patient in every 6 weeks She completes her treatment October 15    No orders of the defined types were placed in this encounter.  The patient has a good understanding of the overall plan. she agrees with it. she will call with any problems that may develop before the next visit here.   Harriette Ohara, MD 04/26/18

## 2018-04-26 NOTE — Patient Instructions (Signed)
[facility] Discharge Instructions for Patients Receiving Chemotherapy  Today you received the following chemotherapy agents Herceptin  To help prevent nausea and vomiting after your treatment, we encourage you to take your nausea medication as directed   If you develop nausea and vomiting that is not controlled by your nausea medication, call the clinic.   BELOW ARE SYMPTOMS THAT SHOULD BE REPORTED IMMEDIATELY:  *FEVER GREATER THAN 100.5 F  *CHILLS WITH OR WITHOUT FEVER  NAUSEA AND VOMITING THAT IS NOT CONTROLLED WITH YOUR NAUSEA MEDICATION  *UNUSUAL SHORTNESS OF BREATH  *UNUSUAL BRUISING OR BLEEDING  TENDERNESS IN MOUTH AND THROAT WITH OR WITHOUT PRESENCE OF ULCERS  *URINARY PROBLEMS  *BOWEL PROBLEMS  UNUSUAL RASH Items with * indicate a potential emergency and should be followed up as soon as possible.  Feel free to call the clinic should you have any questions or concerns. The clinic phone number is [phone].  Please show the CHEMO ALERT CARD at check-in to the Emergency Department and triage nurse.   

## 2018-04-26 NOTE — Telephone Encounter (Signed)
Gave patient avs and summary.

## 2018-05-03 ENCOUNTER — Ambulatory Visit: Payer: Medicaid Other | Attending: Nurse Practitioner | Admitting: Nurse Practitioner

## 2018-05-03 ENCOUNTER — Encounter: Payer: Self-pay | Admitting: Nurse Practitioner

## 2018-05-03 VITALS — BP 117/73 | HR 89 | Temp 98.3°F | Ht 61.0 in | Wt 157.0 lb

## 2018-05-03 DIAGNOSIS — E1165 Type 2 diabetes mellitus with hyperglycemia: Secondary | ICD-10-CM | POA: Insufficient documentation

## 2018-05-03 DIAGNOSIS — Z7984 Long term (current) use of oral hypoglycemic drugs: Secondary | ICD-10-CM | POA: Diagnosis not present

## 2018-05-03 DIAGNOSIS — Z853 Personal history of malignant neoplasm of breast: Secondary | ICD-10-CM | POA: Diagnosis not present

## 2018-05-03 DIAGNOSIS — R0600 Dyspnea, unspecified: Secondary | ICD-10-CM | POA: Insufficient documentation

## 2018-05-03 DIAGNOSIS — H9190 Unspecified hearing loss, unspecified ear: Secondary | ICD-10-CM | POA: Diagnosis not present

## 2018-05-03 DIAGNOSIS — E118 Type 2 diabetes mellitus with unspecified complications: Secondary | ICD-10-CM | POA: Diagnosis not present

## 2018-05-03 DIAGNOSIS — Z8249 Family history of ischemic heart disease and other diseases of the circulatory system: Secondary | ICD-10-CM | POA: Diagnosis not present

## 2018-05-03 DIAGNOSIS — Z79899 Other long term (current) drug therapy: Secondary | ICD-10-CM | POA: Insufficient documentation

## 2018-05-03 DIAGNOSIS — I10 Essential (primary) hypertension: Secondary | ICD-10-CM | POA: Insufficient documentation

## 2018-05-03 DIAGNOSIS — D509 Iron deficiency anemia, unspecified: Secondary | ICD-10-CM | POA: Diagnosis not present

## 2018-05-03 DIAGNOSIS — E1169 Type 2 diabetes mellitus with other specified complication: Secondary | ICD-10-CM | POA: Diagnosis not present

## 2018-05-03 DIAGNOSIS — F329 Major depressive disorder, single episode, unspecified: Secondary | ICD-10-CM | POA: Insufficient documentation

## 2018-05-03 DIAGNOSIS — E785 Hyperlipidemia, unspecified: Secondary | ICD-10-CM | POA: Diagnosis not present

## 2018-05-03 LAB — GLUCOSE, POCT (MANUAL RESULT ENTRY): POC GLUCOSE: 124 mg/dL — AB (ref 70–99)

## 2018-05-03 MED ORDER — LISINOPRIL 10 MG PO TABS: 10.0000 mg | ORAL_TABLET | Freq: Every day | ORAL | 1 refills | Status: DC

## 2018-05-03 MED ORDER — METFORMIN HCL ER 500 MG PO TB24: 1000.0000 mg | ORAL_TABLET | Freq: Every day | ORAL | 0 refills | Status: DC

## 2018-05-03 MED ORDER — ATORVASTATIN CALCIUM 40 MG PO TABS: 40.0000 mg | ORAL_TABLET | Freq: Every day | ORAL | 1 refills | Status: DC

## 2018-05-03 MED ORDER — FERROUS SULFATE 325 (65 FE) MG PO TABS: 325.0000 mg | ORAL_TABLET | Freq: Every day | ORAL | 1 refills | Status: DC

## 2018-05-03 MED ORDER — METFORMIN HCL ER 500 MG PO TB24: 1000.0000 mg | ORAL_TABLET | Freq: Every day | ORAL | 1 refills | Status: DC

## 2018-05-03 NOTE — Progress Notes (Addendum)
Assessment & Plan:  Erin [name] was seen today for follow-up.  Diagnoses and all orders for this visit:  Essential hypertension -     lisinopril (PRINIVIL,ZESTRIL) 10 MG tablet; Take 1 tablet (10 mg total) by mouth daily. Continue all antihypertensives as prescribed.  Remember to bring in your blood pressure log with you for your follow up appointment.  DASH/Mediterranean Diets are healthier choices for HTN.    Type 2 diabetes mellitus with complication, without long-term current use of insulin (HCC) -     Glucose (CBG) -     metFORMIN (GLUCOPHAGE XR) 500 MG 24 hr tablet; Take 2 tablets (1,000 mg total) by mouth daily with breakfast. Continue blood sugar control as discussed in office today, low carbohydrate diet, and regular physical exercise as tolerated, 150 minutes per week (30 min each day, 5 days per week, or 50 min 3 days per week). Keep blood sugar logs with fasting goal of 90-130 mg/dl, post prandial (after you eat) less than 180.  For Hypoglycemia: BS <60 and Hyperglycemia BS >400; contact the clinic ASAP. Annual eye exams and foot exams are recommended.  Hyperlipidemia associated with type 2 diabetes mellitus (HCC) -     atorvastatin (LIPITOR) 40 MG tablet; Take 1 tablet (40 mg total) by mouth daily. INSTRUCTIONS: Work on a low fat, heart healthy diet and participate in regular aerobic exercise program by working out at least 150 minutes per week; 5 days a week-30 minutes per day. Avoid red meat, fried foods. junk foods, sodas, sugary drinks, unhealthy snacking, alcohol and smoking.  Drink at least 48oz of water per day and monitor your carbohydrate intake daily.    Iron deficiency anemia, unspecified iron deficiency anemia type -     ferrous sulfate 325 (65 FE) MG tablet; Take 1 tablet (325 mg total) by mouth daily before breakfast.   Patient has been counseled on age-appropriate routine health concerns for screening and prevention. These are reviewed and up-to-date. Referrals  have been placed accordingly. Immunizations are up-to-date or declined.    Subjective:   Chief Complaint  Patient presents with  . Follow-up    Pt. is here to follow-up on hypertension.    HPI HANIFA ANTONETTI 57 y.o. female presents to office today for follow up to HTN. Patient is DEAF and onsite sign language interpreter was used today.    Essential Hypertension Chronic. Stable. Blood pressure is elevated today. Doing well on lisinopril 23m daily.   Chest pain: no   Dyspnea: no   Claudication: no  Medication Side Effects  Lightheadedness: no   Urinary frequency: no   Edema: no   Impotence: no  Preventitive Healthcare:  Exercise: no   Diet Pattern: diet: general  Salt Restriction:  no  BP Readings from Last 3 Encounters:  05/03/18 117/73  04/26/18 130/69  04/05/18 113/68   Diabetes Mellitus Type 2 Chronic. Stable. She endorses medication compliance taking metformin 1000 mg daily. Denies any hypo or hyperglycemic symptoms. She is not due for an A1c today. She does not have her glucometer with her today for review.  Lab Results  Component Value Date   HGBA1C 5.7 11/29/2017    Hyperlipidemia Patient presents for follow up to hyperlipidemia.  She is medication compliant taking atorvastatin 40 mg. She is not diet compliant and denies skin xanthelasma or statin intolerance including myalgias. LDL not quite at goal.  Lab Results  Component Value Date   CHOL 151 04/02/2017   Lab Results  Component Value Date  HDL 42 04/02/2017   Lab Results  Component Value Date   LDLCALC 79 04/02/2017   Lab Results  Component Value Date   TRIG 148 04/02/2017   Lab Results  Component Value Date   CHOLHDL 3.6 04/02/2017    Review of Systems  Constitutional: Negative for fever, malaise/fatigue and weight loss.  HENT: Negative.  Negative for nosebleeds.   Eyes: Negative.  Negative for blurred vision, double vision and photophobia.  Respiratory: Negative.  Negative for cough and  shortness of breath.   Cardiovascular: Negative.  Negative for chest pain, palpitations and leg swelling.  Gastrointestinal: Negative.  Negative for heartburn, nausea and vomiting.  Musculoskeletal: Negative.  Negative for myalgias.  Neurological: Negative.  Negative for dizziness, focal weakness, seizures and headaches.  Psychiatric/Behavioral: Negative.  Negative for suicidal ideas.    Past Medical History:  Diagnosis Date  . Anemia   . Arthritis    knees, elbows  . BRCA2 gene mutation positive 02/17/2018   BRCA2 c.4552del (p.Glu1518Asnfs*25)  Result reported out on 02/15/2018.   . Breast cancer ([place]) 05/2017   left  . Cough 06/03/2017  . Deaf   . Depression   . Dyspnea    states some SOB with ADLs and aanemia   . Family history of breast cancer   . Family history of breast cancer   . Family history of colon cancer   . Genetic testing 02/17/2018   The Common Hereditary Cancer Panel offered by [facility] includes sequencing and/or deletion duplication testing of the following 47 genes: APC, ATM, AXIN2, BARD1, BMPR1A, BRCA1, BRCA2, BRIP1, CDH1, CDKN2A (p14ARF), CDKN2A (p16INK4a), CKD4, CHEK2, CTNNA1, DICER1, EPCAM (Deletion/duplication testing only), GREM1 (promoter region deletion/duplication testing only), KIT, MEN1, MLH1, MSH2, MSH3, MSH6, MU  . High cholesterol   . History of blood transfusion 07/2017   [facility]  . Hypertension   . Non-insulin dependent type 2 diabetes mellitus ([place])   . Runny nose 06/03/2017   clear drainage, per pt.  . SVD (spontaneous vaginal delivery)    x 2    Past Surgical History:  Procedure Laterality Date  . BREAST LUMPECTOMY Left 06/10/2007  . BREAST LUMPECTOMY WITH RADIOACTIVE SEED AND SENTINEL LYMPH NODE BIOPSY Left 06/09/2017   Procedure: BREAST LUMPECTOMY WITH RADIOACTIVE SEED AND SENTINEL LYMPH NODE BIOPSY;  Surgeon: Excell Seltzer, MD;  Location: [facility];  Service: General;  Laterality: Left;  . COLONOSCOPY  11/02/2017   polyps    . LAPAROSCOPIC ASSISTED VAGINAL HYSTERECTOMY N/A 11/04/2017   Procedure: LAPAROSCOPIC ASSISTED VAGINAL HYSTERECTOMY;  Surgeon: [name], MD;  Location: [facility];  Service: Gynecology;  Laterality: N/A;  . LAPAROSCOPIC BILATERAL SALPINGO OOPHERECTOMY Bilateral 11/04/2017   Procedure: LAPAROSCOPIC BILATERAL SALPINGO OOPHORECTOMY;  Surgeon: [name], MD;  Location: [facility];  Service: Gynecology;  Laterality: Bilateral;  . MULTIPLE TOOTH EXTRACTIONS    . PORTACATH PLACEMENT Right 06/09/2017   Procedure: INSERTION PORT-A-CATH WITH [place];  Surgeon: Excell Seltzer, MD;  Location: [facility];  Service: General;  Laterality: Right;  . TUBAL LIGATION  02/04/2002  . UPPER GI ENDOSCOPY      Family History  Problem Relation Age of Onset  . Hypertension Mother   . Diabetes Father   . Hypertension Sister   . Diabetes Paternal Grandmother   . Colon cancer Paternal Grandmother 37  . CAD Brother   . Diabetes Brother   . Breast cancer Maternal Aunt        dx >50  . Heart attack Paternal  Grandfather   . Breast cancer Maternal Aunt        dx under 52  . Breast cancer Maternal Aunt        dx  under 65    Social History Reviewed with no changes to be made today.   Outpatient Medications Prior to Visit  Medication Sig Dispense Refill  . Blood Glucose Monitoring Suppl (ACCU-CHEK AVIVA PLUS) W/DEVICE KIT Check Blood sugar AM fasting and before dinner 1 kit 0  . cholecalciferol (VITAMIN D) 1000 units tablet Take 1,000 Units by mouth daily.    Marland Kitchen glucose blood (ACCU-CHEK AVIVA) test strip Use as instructed 100 each 12  . Lancet Devices (ACCU-CHEK SOFTCLIX) lancets Use as instructed 1 each 0  . lidocaine-prilocaine (EMLA) cream Apply to affected area once 30 g 3  . Omega-3 Fatty Acids (FISH OIL OMEGA-3 PO) Take 1 capsule by mouth daily.     . TRUEPLUS LANCETS 28G MISC USE AS DIRECTED BY PHYSICIAN 100 each 0  . atorvastatin (LIPITOR) 40 MG tablet Take 1 tablet (40 mg total) by mouth  daily. 90 tablet 1  . lisinopril (PRINIVIL,ZESTRIL) 10 MG tablet Take 1 tablet (10 mg total) by mouth daily. 30 tablet 1  . metFORMIN (GLUCOPHAGE XR) 500 MG 24 hr tablet Take 2 tablets (1,000 mg total) by mouth daily with breakfast. 180 tablet 0  . clindamycin (CLINDAGEL) 1 % gel Apply topically 2 (two) times daily. (Patient not taking: Reported on 05/03/2018) 30 g 0  . mupirocin cream (BACTROBAN) 2 % Apply 1 application topically 4 (four) times daily. (Patient not taking: Reported on 03/02/2018) 30 g 2  . doxycycline (VIBRA-TABS) 100 MG tablet Take 1 tablet (100 mg total) by mouth 2 (two) times daily. 20 tablet 0  . ferrous sulfate 325 (65 FE) MG tablet Take 1 tablet (325 mg total) by mouth daily before breakfast. 90 tablet 1   Facility-Administered Medications Prior to Visit  Medication Dose Route Frequency Provider Last Rate Last Dose  . 0.9 %  sodium chloride infusion  500 mL Intravenous Once Nandigam, Venia Minks, MD        No Known Allergies     Objective:    BP 117/73 (BP Location: Right Arm, Patient Position: Sitting, Cuff Size: Large)   Pulse 89   Temp 98.3 F (36.8 C) (Oral)   Ht '5\' 1"'$  (1.549 m)   Wt 157 lb (71.2 kg)   LMP 07/09/2017 (LMP Unknown)   SpO2 98%   BMI 29.66 kg/m  Wt Readings from Last 3 Encounters:  05/03/18 157 lb (71.2 kg)  04/26/18 159 lb 9.6 oz (72.4 kg)  03/15/18 156 lb 4 oz (70.9 kg)    Physical Exam  Constitutional: She is oriented to person, place, and time. She appears well-developed and well-nourished. She is cooperative.  HENT:  Head: Normocephalic and atraumatic.  Eyes: EOM are normal.  Neck: Normal range of motion.  Cardiovascular: Normal rate, regular rhythm and normal heart sounds. Exam reveals no gallop and no friction rub.  No murmur heard. Pulmonary/Chest: Effort normal and breath sounds normal. No tachypnea. No respiratory distress. She has no decreased breath sounds. She has no wheezes. She has no rhonchi. She has no rales. She exhibits  no tenderness.  Abdominal: Soft. Bowel sounds are normal.  Musculoskeletal: Normal range of motion. She exhibits no edema.  Neurological: She is alert and oriented to person, place, and time. Coordination normal.  Skin: Skin is warm and dry.  Psychiatric: She has a normal  mood and affect. Her behavior is normal. Judgment and thought content normal.  Nursing note and vitals reviewed.      Patient has been counseled extensively about nutrition and exercise as well as the importance of adherence with medications and regular follow-up. The patient was given clear instructions to go to ER or return to medical center if symptoms don't improve, worsen or new problems develop. The patient verbalized understanding.   Follow-up: Return in about 4 weeks (around 05/31/2018) for Physical.   [name], FNP-BC [facility] and [place], [place]   05/03/2018, 3:00 PM

## 2018-05-17 ENCOUNTER — Other Ambulatory Visit: Payer: Self-pay | Admitting: *Deleted

## 2018-05-17 DIAGNOSIS — Z95828 Presence of other vascular implants and grafts: Secondary | ICD-10-CM

## 2018-05-18 ENCOUNTER — Inpatient Hospital Stay: Payer: Medicaid Other

## 2018-05-18 ENCOUNTER — Inpatient Hospital Stay: Payer: Medicaid Other | Attending: Hematology and Oncology

## 2018-05-18 VITALS — BP 115/59 | HR 75 | Temp 98.1°F | Resp 18

## 2018-05-18 DIAGNOSIS — Z9221 Personal history of antineoplastic chemotherapy: Secondary | ICD-10-CM | POA: Diagnosis not present

## 2018-05-18 DIAGNOSIS — Z171 Estrogen receptor negative status [ER-]: Secondary | ICD-10-CM | POA: Insufficient documentation

## 2018-05-18 DIAGNOSIS — C50512 Malignant neoplasm of lower-outer quadrant of left female breast: Secondary | ICD-10-CM

## 2018-05-18 DIAGNOSIS — R21 Rash and other nonspecific skin eruption: Secondary | ICD-10-CM | POA: Diagnosis not present

## 2018-05-18 DIAGNOSIS — Z5112 Encounter for antineoplastic immunotherapy: Secondary | ICD-10-CM | POA: Diagnosis not present

## 2018-05-18 DIAGNOSIS — Z90710 Acquired absence of both cervix and uterus: Secondary | ICD-10-CM | POA: Insufficient documentation

## 2018-05-18 DIAGNOSIS — Z1502 Genetic susceptibility to malignant neoplasm of ovary: Secondary | ICD-10-CM | POA: Diagnosis not present

## 2018-05-18 DIAGNOSIS — C5701 Malignant neoplasm of right fallopian tube: Secondary | ICD-10-CM | POA: Diagnosis not present

## 2018-05-18 DIAGNOSIS — Z95828 Presence of other vascular implants and grafts: Secondary | ICD-10-CM

## 2018-05-18 LAB — CMP (CANCER CENTER ONLY)
ALBUMIN: 3.8 g/dL (ref 3.5–5.0)
ALT: 20 U/L (ref 0–44)
ANION GAP: 10 (ref 5–15)
AST: 15 U/L (ref 15–41)
Alkaline Phosphatase: 89 U/L (ref 38–126)
BUN: 12 mg/dL (ref 6–20)
CHLORIDE: 106 mmol/L (ref 98–111)
CO2: 25 mmol/L (ref 22–32)
Calcium: 9.4 mg/dL (ref 8.9–10.3)
Creatinine: 0.75 mg/dL (ref 0.44–1.00)
GFR, Est AFR Am: >60 mL/min (ref >60)
GFR, Estimated: >60 mL/min (ref >60)
GLUCOSE: 114 mg/dL — AB (ref 70–99)
POTASSIUM: 4.1 mmol/L (ref 3.5–5.1)
Sodium: 141 mmol/L (ref 135–145)
TOTAL PROTEIN: 7.7 g/dL (ref 6.5–8.1)

## 2018-05-18 LAB — CBC WITH DIFFERENTIAL (CANCER CENTER ONLY)
BASOS ABS: 0 10*3/uL (ref 0.0–0.1)
BASOS PCT: 0 %
EOS ABS: 0.1 10*3/uL (ref 0.0–0.5)
EOS PCT: 2 %
HEMATOCRIT: 32.7 % — AB (ref 34.8–46.6)
Hemoglobin: 10.7 g/dL — ABNORMAL LOW (ref 11.6–15.9)
Lymphocytes Relative: 27 %
Lymphs Abs: 2.4 10*3/uL (ref 0.9–3.3)
MCH: 32.6 pg (ref 25.1–34.0)
MCHC: 32.7 g/dL (ref 31.5–36.0)
MCV: 99.7 fL (ref 79.5–101.0)
MONO ABS: 0.5 10*3/uL (ref 0.1–0.9)
MONOS PCT: 6 %
NEUTROS ABS: 6 10*3/uL (ref 1.5–6.5)
Neutrophils Relative %: 65 %
PLATELETS: 184 10*3/uL (ref 145–400)
RBC: 3.28 MIL/uL — ABNORMAL LOW (ref 3.70–5.45)
RDW: 13.4 % (ref 11.2–14.5)
WBC Count: 9 10*3/uL (ref 3.9–10.3)

## 2018-05-18 MED ORDER — HEPARIN SOD (PORK) LOCK FLUSH 100 UNIT/ML IV SOLN
500.0000 [IU] | Freq: Once | INTRAVENOUS | Status: AC | PRN
  Administered 2018-05-18: 500 [IU]
  Filled 2018-05-18: qty 5

## 2018-05-18 MED ORDER — TRASTUZUMAB CHEMO 150 MG IV SOLR
450.0000 mg | Freq: Once | INTRAVENOUS | Status: AC
  Administered 2018-05-18: 450 mg via INTRAVENOUS
  Filled 2018-05-18: qty 21.43

## 2018-05-18 MED ORDER — DIPHENHYDRAMINE HCL 50 MG/ML IJ SOLN
50.0000 mg | Freq: Once | INTRAMUSCULAR | Status: AC
  Administered 2018-05-18: 50 mg via INTRAVENOUS

## 2018-05-18 MED ORDER — ACETAMINOPHEN 325 MG PO TABS
ORAL_TABLET | ORAL | Status: AC
  Filled 2018-05-18: qty 2

## 2018-05-18 MED ORDER — SODIUM CHLORIDE 0.9 % IV SOLN
Freq: Once | INTRAVENOUS | Status: AC
  Administered 2018-05-18: 10:00:00 via INTRAVENOUS
  Filled 2018-05-18: qty 250

## 2018-05-18 MED ORDER — ACETAMINOPHEN 325 MG PO TABS
650.0000 mg | ORAL_TABLET | Freq: Once | ORAL | Status: AC
  Administered 2018-05-18: 650 mg via ORAL

## 2018-05-18 MED ORDER — SODIUM CHLORIDE 0.9 % IJ SOLN
10.0000 mL | Freq: Once | INTRAMUSCULAR | Status: AC
  Administered 2018-05-18: 10 mL
  Filled 2018-05-18: qty 10

## 2018-05-18 MED ORDER — SODIUM CHLORIDE 0.9% FLUSH
10.0000 mL | INTRAVENOUS | Status: DC | PRN
  Administered 2018-05-18: 10 mL
  Filled 2018-05-18: qty 10

## 2018-05-18 MED ORDER — DIPHENHYDRAMINE HCL 50 MG/ML IJ SOLN
INTRAMUSCULAR | Status: AC
  Filled 2018-05-18: qty 1

## 2018-05-18 NOTE — Patient Instructions (Signed)
[facility] Discharge Instructions for Patients Receiving Chemotherapy  Today you received the following chemotherapy agents Herceptin  To help prevent nausea and vomiting after your treatment, we encourage you to take your nausea medication as directed   If you develop nausea and vomiting that is not controlled by your nausea medication, call the clinic.   BELOW ARE SYMPTOMS THAT SHOULD BE REPORTED IMMEDIATELY:  *FEVER GREATER THAN 100.5 F  *CHILLS WITH OR WITHOUT FEVER  NAUSEA AND VOMITING THAT IS NOT CONTROLLED WITH YOUR NAUSEA MEDICATION  *UNUSUAL SHORTNESS OF BREATH  *UNUSUAL BRUISING OR BLEEDING  TENDERNESS IN MOUTH AND THROAT WITH OR WITHOUT PRESENCE OF ULCERS  *URINARY PROBLEMS  *BOWEL PROBLEMS  UNUSUAL RASH Items with * indicate a potential emergency and should be followed up as soon as possible.  Feel free to call the clinic should you have any questions or concerns. The clinic phone number is [phone].  Please show the CHEMO ALERT CARD at check-in to the Emergency Department and triage nurse.   

## 2018-05-27 ENCOUNTER — Other Ambulatory Visit (HOSPITAL_COMMUNITY): Payer: Self-pay | Admitting: *Deleted

## 2018-05-27 DIAGNOSIS — Z171 Estrogen receptor negative status [ER-]: Principal | ICD-10-CM

## 2018-05-27 DIAGNOSIS — C50512 Malignant neoplasm of lower-outer quadrant of left female breast: Secondary | ICD-10-CM

## 2018-05-30 ENCOUNTER — Ambulatory Visit: Payer: Medicaid Other | Attending: Nurse Practitioner | Admitting: Nurse Practitioner

## 2018-05-30 ENCOUNTER — Encounter: Payer: Self-pay | Admitting: Nurse Practitioner

## 2018-05-30 VITALS — BP 112/74 | HR 78 | Temp 98.3°F | Ht 61.0 in | Wt 160.2 lb

## 2018-05-30 DIAGNOSIS — H9190 Unspecified hearing loss, unspecified ear: Secondary | ICD-10-CM | POA: Diagnosis not present

## 2018-05-30 DIAGNOSIS — E1169 Type 2 diabetes mellitus with other specified complication: Secondary | ICD-10-CM | POA: Diagnosis not present

## 2018-05-30 DIAGNOSIS — Z7984 Long term (current) use of oral hypoglycemic drugs: Secondary | ICD-10-CM | POA: Insufficient documentation

## 2018-05-30 DIAGNOSIS — Z800 Family history of malignant neoplasm of digestive organs: Secondary | ICD-10-CM | POA: Diagnosis not present

## 2018-05-30 DIAGNOSIS — Z803 Family history of malignant neoplasm of breast: Secondary | ICD-10-CM | POA: Insufficient documentation

## 2018-05-30 DIAGNOSIS — Z0000 Encounter for general adult medical examination without abnormal findings: Secondary | ICD-10-CM | POA: Insufficient documentation

## 2018-05-30 DIAGNOSIS — C50912 Malignant neoplasm of unspecified site of left female breast: Secondary | ICD-10-CM | POA: Diagnosis not present

## 2018-05-30 DIAGNOSIS — E119 Type 2 diabetes mellitus without complications: Secondary | ICD-10-CM | POA: Insufficient documentation

## 2018-05-30 DIAGNOSIS — E118 Type 2 diabetes mellitus with unspecified complications: Secondary | ICD-10-CM

## 2018-05-30 DIAGNOSIS — Z23 Encounter for immunization: Secondary | ICD-10-CM | POA: Insufficient documentation

## 2018-05-30 DIAGNOSIS — E785 Hyperlipidemia, unspecified: Secondary | ICD-10-CM | POA: Diagnosis not present

## 2018-05-30 DIAGNOSIS — R1313 Dysphagia, pharyngeal phase: Secondary | ICD-10-CM | POA: Diagnosis not present

## 2018-05-30 DIAGNOSIS — Z8249 Family history of ischemic heart disease and other diseases of the circulatory system: Secondary | ICD-10-CM | POA: Insufficient documentation

## 2018-05-30 DIAGNOSIS — I10 Essential (primary) hypertension: Secondary | ICD-10-CM | POA: Insufficient documentation

## 2018-05-30 DIAGNOSIS — F329 Major depressive disorder, single episode, unspecified: Secondary | ICD-10-CM | POA: Diagnosis not present

## 2018-05-30 DIAGNOSIS — Z79899 Other long term (current) drug therapy: Secondary | ICD-10-CM | POA: Diagnosis not present

## 2018-05-30 DIAGNOSIS — Z833 Family history of diabetes mellitus: Secondary | ICD-10-CM | POA: Insufficient documentation

## 2018-05-30 LAB — POCT GLYCOSYLATED HEMOGLOBIN (HGB A1C): HEMOGLOBIN A1C: 6.6 % — AB (ref 4.0–5.6)

## 2018-05-30 LAB — GLUCOSE, POCT (MANUAL RESULT ENTRY): POC GLUCOSE: 124 mg/dL — AB (ref 70–99)

## 2018-05-30 MED ORDER — ATORVASTATIN CALCIUM 40 MG PO TABS: 40.0000 mg | ORAL_TABLET | Freq: Every day | ORAL | 1 refills | Status: DC

## 2018-05-30 MED ORDER — ACCU-CHEK SOFTCLIX LANCETS MISC: 12 refills | Status: DC

## 2018-05-30 MED ORDER — LISINOPRIL 10 MG PO TABS: 10.0000 mg | ORAL_TABLET | Freq: Every day | ORAL | 1 refills | Status: DC

## 2018-05-30 MED ORDER — METFORMIN HCL ER 500 MG PO TB24: 1000.0000 mg | ORAL_TABLET | Freq: Every day | ORAL | 1 refills | Status: DC

## 2018-05-30 MED ORDER — GLUCOSE BLOOD VI STRP: ORAL_STRIP | 12 refills | Status: DC

## 2018-05-30 NOTE — Progress Notes (Signed)
Assessment & Plan:  Erin [name] was seen today for annual exam.  Diagnoses and all orders for this visit:  Well woman exam without gynecological exam  Type 2 diabetes mellitus with complication, without long-term current use of insulin ([place]) Chronic. Well controlled.  -     Glucose (CBG) -     HgB A1c -     metFORMIN (GLUCOPHAGE XR) 500 MG 24 hr tablet; Take 2 tablets (1,000 mg total) by mouth daily with breakfast. -     glucose blood (ACCU-CHEK AVIVA) test strip; Monitor blood glucose levels by fingerstick once daily -     ACCU-CHEK SOFTCLIX LANCETS lancets; Monitor blood glucose levels by fingerstick once daily Continue blood sugar control as discussed in office today, low carbohydrate diet, and regular physical exercise as tolerated, 150 minutes per week (30 min each day, 5 days per week, or 50 min 3 days per week). Keep blood sugar logs with fasting goal of 90-130 mg/dl, post prandial (after you eat) less than 180.  For Hypoglycemia: BS <60 and Hyperglycemia BS >400; contact the clinic ASAP. Annual eye exams and foot exams are recommended. Lab Results  Component Value Date   HGBA1C 6.6 (A) 05/30/2018    Essential hypertension Chronic and well controlled.  BP Readings from Last 3 Encounters:  05/30/18 112/74  05/18/18 (!) 115/59  05/03/18 117/73   -     lisinopril (PRINIVIL,ZESTRIL) 10 MG tablet; Take 1 tablet (10 mg total) by mouth daily. Continue all antihypertensives as prescribed.  Remember to bring in your blood pressure log with you for your follow up appointment.  DASH/Mediterranean Diets are healthier choices for HTN.    Hyperlipidemia associated with type 2 diabetes mellitus (HCC) -     atorvastatin (LIPITOR) 40 MG tablet; Take 1 tablet (40 mg total) by mouth daily. INSTRUCTIONS: Work on a low fat, heart healthy diet and participate in regular aerobic exercise program by working out at least 150 minutes per week; 5 days a week-30 minutes per day. Avoid red meat, fried  foods. junk foods, sodas, sugary drinks, unhealthy snacking, alcohol and smoking.  Drink at least 48oz of water per day and monitor your carbohydrate intake daily.   Pharyngeal dysphagia -     Ambulatory referral to ENT  Need for immunization against influenza -     Flu Vaccine QUAD 36+ mos IM    Patient has been counseled on age-appropriate routine health concerns for screening and prevention. These are reviewed and up-to-date. Referrals have been placed accordingly. Immunizations are up-to-date or declined.    Subjective:   Chief Complaint  Patient presents with  . Annual Exam    Pt. is here for a physical.    HPI Erin [name] 57 y.o. female presents to office today for annual physical exam. She is s/p TVH (fallopian tube CA).  Currently being followed by Oncology for left breast CA. She is deaf and has an onsite interpreter accompanying here.    Dysphagia Patient presents with complaints of white thick particles that get stuck in her throat. Symptoms are aggravated after eating. She signs that she sometimes has to dig them out the back of her throat. This has been occurring for several years however she has never had any evaluation of her symptoms prior to today.Patient reports nothing has become stuck, but feels a lump sensation when swallowing. Associated with the dysphagia, patient also complains of no other symptoms. Patient denies dysphonia, hoarseness, hemoptysis and heartburn.  Patient denies regurgitation of  undigested food.    Review of Systems  Constitutional: Negative.  Negative for chills, fever, malaise/fatigue and weight loss.  HENT: Positive for hearing loss (She is deaf). Negative for congestion, sinus pain and sore throat.        SEE HPI  Eyes: Negative.  Negative for blurred vision, double vision, photophobia and pain.  Respiratory: Negative.  Negative for cough, sputum production, shortness of breath and wheezing.   Cardiovascular: Negative.  Negative for  chest pain and leg swelling.  Gastrointestinal: Negative.  Negative for abdominal pain, constipation, diarrhea, heartburn, nausea and vomiting.  Genitourinary: Negative.   Musculoskeletal: Negative.  Negative for joint pain and myalgias.  Skin: Negative.  Negative for rash.  Neurological: Negative.  Negative for dizziness, tremors, speech change, focal weakness, seizures and headaches.  Endo/Heme/Allergies: Negative.  Negative for environmental allergies.  Psychiatric/Behavioral: Negative.  Negative for depression and suicidal ideas. The patient is not nervous/anxious and does not have insomnia.     Past Medical History:  Diagnosis Date  . Anemia   . Arthritis    knees, elbows  . BRCA2 gene mutation positive 02/17/2018   BRCA2 c.4552del (p.Glu1518Asnfs*25)  Result reported out on 02/15/2018.   . Breast cancer ([place]) 05/2017   left  . Cough 06/03/2017  . Deaf   . Depression   . Dyspnea    states some SOB with ADLs and aanemia   . Family history of breast cancer   . Family history of breast cancer   . Family history of colon cancer   . Genetic testing 02/17/2018   The Common Hereditary Cancer Panel offered by [facility] includes sequencing and/or deletion duplication testing of the following 47 genes: APC, ATM, AXIN2, BARD1, BMPR1A, BRCA1, BRCA2, BRIP1, CDH1, CDKN2A (p14ARF), CDKN2A (p16INK4a), CKD4, CHEK2, CTNNA1, DICER1, EPCAM (Deletion/duplication testing only), GREM1 (promoter region deletion/duplication testing only), KIT, MEN1, MLH1, MSH2, MSH3, MSH6, MU  . High cholesterol   . History of blood transfusion 07/2017   [facility]  . Hypertension   . Non-insulin dependent type 2 diabetes mellitus ([place])   . Runny nose 06/03/2017   clear drainage, per pt.  . SVD (spontaneous vaginal delivery)    x 2    Past Surgical History:  Procedure Laterality Date  . BREAST LUMPECTOMY Left 06/10/2007  . BREAST LUMPECTOMY WITH RADIOACTIVE SEED AND SENTINEL LYMPH NODE BIOPSY Left 06/09/2017   Procedure:  BREAST LUMPECTOMY WITH RADIOACTIVE SEED AND SENTINEL LYMPH NODE BIOPSY;  Surgeon: Excell Seltzer, MD;  Location: [facility];  Service: General;  Laterality: Left;  . COLONOSCOPY  11/02/2017   polyps  . LAPAROSCOPIC ASSISTED VAGINAL HYSTERECTOMY N/A 11/04/2017   Procedure: LAPAROSCOPIC ASSISTED VAGINAL HYSTERECTOMY;  Surgeon: [name], MD;  Location: [facility];  Service: Gynecology;  Laterality: N/A;  . LAPAROSCOPIC BILATERAL SALPINGO OOPHERECTOMY Bilateral 11/04/2017   Procedure: LAPAROSCOPIC BILATERAL SALPINGO OOPHORECTOMY;  Surgeon: [name], MD;  Location: [facility];  Service: Gynecology;  Laterality: Bilateral;  . MULTIPLE TOOTH EXTRACTIONS    . PORTACATH PLACEMENT Right 06/09/2017   Procedure: INSERTION PORT-A-CATH WITH [place];  Surgeon: Excell Seltzer, MD;  Location: [facility];  Service: General;  Laterality: Right;  . TUBAL LIGATION  02/04/2002  . UPPER GI ENDOSCOPY      Family History  Problem Relation Age of Onset  . Hypertension Mother   . Diabetes Father   . Hypertension Sister   . Diabetes Paternal Grandmother   . Colon cancer Paternal Grandmother 60  . CAD Brother   .  Diabetes Brother   . Breast cancer Maternal Aunt        dx >50  . Heart attack Paternal Grandfather   . Breast cancer Maternal Aunt        dx under 58  . Breast cancer Maternal Aunt        dx  under 107    Social History Reviewed with no changes to be made today.   Outpatient Medications Prior to Visit  Medication Sig Dispense Refill  . Blood Glucose Monitoring Suppl (ACCU-CHEK AVIVA PLUS) W/DEVICE KIT Check Blood sugar AM fasting and before dinner 1 kit 0  . cholecalciferol (VITAMIN D) 1000 units tablet Take 1,000 Units by mouth daily.    . ferrous sulfate 325 (65 FE) MG tablet Take 1 tablet (325 mg total) by mouth daily before breakfast. 90 tablet 1  . Lancet Devices (ACCU-CHEK SOFTCLIX) lancets Use as instructed 1 each 0  . Omega-3 Fatty Acids (FISH OIL OMEGA-3  PO) Take 1 capsule by mouth daily.     Marland Kitchen atorvastatin (LIPITOR) 40 MG tablet Take 1 tablet (40 mg total) by mouth daily. 90 tablet 1  . glucose blood (ACCU-CHEK AVIVA) test strip Use as instructed 100 each 12  . lisinopril (PRINIVIL,ZESTRIL) 10 MG tablet Take 1 tablet (10 mg total) by mouth daily. 90 tablet 1  . metFORMIN (GLUCOPHAGE XR) 500 MG 24 hr tablet Take 2 tablets (1,000 mg total) by mouth daily with breakfast. 180 tablet 1  . TRUEPLUS LANCETS 28G MISC USE AS DIRECTED BY PHYSICIAN 100 each 0  . clindamycin (CLINDAGEL) 1 % gel Apply topically 2 (two) times daily. (Patient not taking: Reported on 05/03/2018) 30 g 0  . lidocaine-prilocaine (EMLA) cream Apply to affected area once (Patient not taking: Reported on 05/30/2018) 30 g 3  . mupirocin cream (BACTROBAN) 2 % Apply 1 application topically 4 (four) times daily. (Patient not taking: Reported on 03/02/2018) 30 g 2   Facility-Administered Medications Prior to Visit  Medication Dose Route Frequency Provider Last Rate Last Dose  . 0.9 %  sodium chloride infusion  500 mL Intravenous Once Nandigam, Venia Minks, MD        No Known Allergies     Objective:    BP 112/74 (BP Location: Right Arm, Patient Position: Sitting, Cuff Size: Large)   Pulse 78   Temp 98.3 F (36.8 C) (Oral)   Ht 5' 1"  (1.549 m)   Wt 160 lb 3.2 oz (72.7 kg)   LMP 07/09/2017 (LMP Unknown)   SpO2 94%   BMI 30.27 kg/m  Wt Readings from Last 3 Encounters:  05/30/18 160 lb 3.2 oz (72.7 kg)  05/03/18 157 lb (71.2 kg)  04/26/18 159 lb 9.6 oz (72.4 kg)    Physical Exam  Constitutional: She is oriented to person, place, and time. She appears well-developed and well-nourished.  HENT:  Head: Normocephalic and atraumatic.  Right Ear: Tympanic membrane, external ear and ear canal normal.  Left Ear: Tympanic membrane, external ear and ear canal normal.  Nose: Nose normal.  Mouth/Throat: Uvula is midline and mucous membranes are normal.  Difficulty assessing  oropharyngeal area due to large tongue and patient gagging multiple times when tongue blade was introduced.   Eyes: Pupils are equal, round, and reactive to light. Conjunctivae, EOM and lids are normal. Right eye exhibits no discharge. No scleral icterus.  Neck: Trachea normal, normal range of motion and full passive range of motion without pain. Neck supple. No tracheal tenderness present. Carotid bruit is  not present. No neck rigidity. No tracheal deviation present. No thyromegaly present.  Cardiovascular: Normal rate, regular rhythm, normal heart sounds and intact distal pulses. Exam reveals no friction rub.  No murmur heard. Pulses:      Dorsalis pedis pulses are 2+ on the right side, and 2+ on the left side.       Posterior tibial pulses are 2+ on the right side, and 2+ on the left side.  Pulmonary/Chest: Effort normal and breath sounds normal. No accessory muscle usage. No respiratory distress. She has no decreased breath sounds. She has no wheezes. She has no rhonchi. She has no rales. She exhibits no tenderness. No breast swelling, discharge or bleeding. Breasts are symmetrical.    Abdominal: Soft. Bowel sounds are normal. She exhibits no distension and no mass. There is no tenderness. There is no rebound and no guarding.  Musculoskeletal: Normal range of motion. She exhibits no edema, tenderness or deformity.  Feet:  Right Foot:  Protective Sensation: 10 sites tested. 10 sites sensed.  Skin Integrity: Negative for skin breakdown, callus or dry skin.  Left Foot:  Protective Sensation: 10 sites tested. 10 sites sensed.  Skin Integrity: Negative for skin breakdown, callus or dry skin.  Lymphadenopathy:    She has no cervical adenopathy.  Neurological: She is alert and oriented to person, place, and time. She has normal strength and normal reflexes. No cranial nerve deficit or sensory deficit. She displays a negative Romberg sign. Coordination and gait normal.  Skin: Skin is warm and dry.  No erythema.  Psychiatric: She has a normal mood and affect. Her speech is normal and behavior is normal. Judgment and thought content normal.      Patient has been counseled extensively about nutrition and exercise as well as the importance of adherence with medications and regular follow-up. The patient was given clear instructions to go to ER or return to medical center if symptoms don't improve, worsen or new problems develop. The patient verbalized understanding.   Follow-up: Return in about 3 months (around 08/29/2018) for HTN.   [name], FNP-BC [facility] [place], [place]   05/30/2018, 12:32 PM

## 2018-05-31 ENCOUNTER — Ambulatory Visit (HOSPITAL_COMMUNITY)
Admission: RE | Admit: 2018-05-31 | Discharge: 2018-05-31 | Disposition: A | Discharge status: Home / Self Care | Payer: Medicaid Other | Source: Ambulatory Visit | Attending: Internal Medicine | Admitting: Internal Medicine

## 2018-05-31 ENCOUNTER — Ambulatory Visit (HOSPITAL_BASED_OUTPATIENT_CLINIC_OR_DEPARTMENT_OTHER)
Admission: RE | Admit: 2018-05-31 | Discharge: 2018-05-31 | Disposition: A | Discharge status: Home / Self Care | Payer: Medicaid Other | Source: Ambulatory Visit | Attending: Internal Medicine | Admitting: Internal Medicine

## 2018-05-31 ENCOUNTER — Encounter (HOSPITAL_COMMUNITY): Payer: Self-pay | Admitting: Internal Medicine

## 2018-05-31 VITALS — BP 130/82 | HR 82 | Wt 159.6 lb

## 2018-05-31 DIAGNOSIS — C50512 Malignant neoplasm of lower-outer quadrant of left female breast: Secondary | ICD-10-CM

## 2018-05-31 DIAGNOSIS — I361 Nonrheumatic tricuspid (valve) insufficiency: Secondary | ICD-10-CM | POA: Insufficient documentation

## 2018-05-31 DIAGNOSIS — E785 Hyperlipidemia, unspecified: Secondary | ICD-10-CM | POA: Diagnosis not present

## 2018-05-31 DIAGNOSIS — Z171 Estrogen receptor negative status [ER-]: Secondary | ICD-10-CM

## 2018-05-31 DIAGNOSIS — I10 Essential (primary) hypertension: Secondary | ICD-10-CM | POA: Diagnosis not present

## 2018-05-31 DIAGNOSIS — Z853 Personal history of malignant neoplasm of breast: Secondary | ICD-10-CM | POA: Insufficient documentation

## 2018-05-31 NOTE — Progress Notes (Signed)
  Echocardiogram 2D Echocardiogram has been performed.  Merrie Roof F 05/31/2018, 12:15 PM

## 2018-05-31 NOTE — Progress Notes (Signed)
[facility] CLINIC NOTE   Referring Physician: Dr [name]  Primary Care: Primary Cardiologist:None   HPI: Ms Erin [name] is 57 year old with h/o deafness, DMII, HTN, left breat cancer referred to the Cardio-Oncology clinic by Dr [name] .   History obtained through sign language interpreter  Diagnosed with left breast CA (BRCA +) in 8/18 ER/PR negative, HER-2 +. Had left lumpectomy. Taxol/Herceptin completed on 09/29/2017, went on to receive every 3 week adjuvant Herceptin on 10/20/2017. Underwent hysterectomy and found to have fallopian tube CA as well.  She presents today for cardio oncology follow up. Doing very well. Has 2 more Herceptin treatments left then she finishes. No SOB, edema, orthopnea or PND.   Echo today: EF 60-65%. GLS inaccurate. Personally reviewed   SUMMARY OF ONCOLOGIC HISTORY:       Malignant neoplasm of lower-outer quadrant of left breast of female, estrogen receptor negative ([place])   05/11/2017 Initial Diagnosis    Left breast biopsy 3:30 position 6 cm from nipple: IDC with DCIS, lymphovascular invasion present, grade 2-3, ER 0%, PR 0%, HER-2 positive ratio 2.28, Ki-67 20%, 1 cm lesion left breast, T1b N0 stage IA clinical stage      06/09/2017 Surgery    Left lumpectomy: IDC grade 3, 1.2 cm, DCIS is present, margins negative, 0/6 lymph nodes negative, ER 0%, PR 0%, HER-2 positive ratio 2.28, Ki-67 20%, T1 BN 0 stage IA      07/09/2017 -  Adjuvant Chemotherapy    Taxol/Herceptin completed on 09/29/2017, went on to receive every 3 week adjuvant Herceptin on 10/20/2017      12/21/2017 -  Chemotherapy    The patient had palonosetron (ALOXI) injection 0.25 mg, 0.25 mg, Intravenous,  Once, 2 of 3 cycles Administration: 0.25 mg (01/11/2018), 0.25 mg (02/01/2018) pegfilgrastim (NEULASTA ONPRO KIT) injection 6 mg, 6 mg, Subcutaneous, Once, 1 of 1 cycle Administration: 6 mg (01/11/2018) CARBOplatin (PARAPLATIN) 580 mg in sodium chloride 0.9 % 250 mL chemo  infusion, 580 mg (100 % of original dose 575 mg), Intravenous,  Once, 2 of 3 cycles Dose modification:   (original dose 575 mg, Cycle 1) Administration: 580 mg (01/11/2018), 580 mg (02/01/2018) PACLitaxel (TAXOL) 306 mg in sodium chloride 0.9 % 500 mL chemo infusion (> 63m/m2), 175 mg/m2 = 306 mg, Intravenous,  Once, 1 of 1 cycle Administration: 306 mg (01/11/2018) fosaprepitant (EMEND) 150 mg, dexamethasone (DECADRON) 12 mg in sodium chloride 0.9 % 145 mL IVPB, , Intravenous,  Once, 2 of 3 cycles Administration:  (01/11/2018),  (02/01/2018)  for chemotherapy treatment.       02/15/2018 Genetic Testing    The Common Hereditary Cancer Panel offered by [facility] includes sequencing and/or deletion duplication testing of the following 47 genes: APC, ATM, AXIN2, BARD1, BMPR1A, BRCA1, BRCA2, BRIP1, CDH1, CDKN2A (p14ARF), CDKN2A (p16INK4a), CKD4, CHEK2, CTNNA1, DICER1, EPCAM (Deletion/duplication testing only), GREM1 (promoter region deletion/duplication testing only), KIT, MEN1, MLH1, MSH2, MSH3, MSH6, MUTYH, NBN, NF1, NHTL1, PALB2, PDGFRA, PMS2, POLD1, POLE, PTEN, RAD50, RAD51C, RAD51D, SDHB, SDHC, SDHD, SMAD4, SMARCA4. STK11, TP53, TSC1, TSC2, and VHL.  The following genes were evaluated for sequence changes only: SDHA and HOXB13 c.251G>A variant only.   Results: POSITIVE. 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  ECHO 05/2017 EF 60-65% Lat S' GLS   Review of systems complete and found to be negative unless listed in HPI.   Past Medical History:  Diagnosis Date  . Anemia   . Arthritis    knees, elbows  . BRCA2 gene mutation positive 02/17/2018   BRCA2 c.4552del (p.Glu1518Asnfs*25)  Result reported out on 02/15/2018.   . Breast cancer ([place]) 05/2017   left  . Cough 06/03/2017  . Deaf   . Depression   . Dyspnea    states some SOB with ADLs and aanemia   . Family history of breast cancer   . Family history of breast cancer   . Family history of colon cancer   . Genetic testing 02/17/2018   The Common Hereditary Cancer Panel offered by [facility] includes sequencing and/or deletion duplication testing of the following 47 genes: APC, ATM, AXIN2, BARD1, BMPR1A, BRCA1, BRCA2, BRIP1, CDH1, CDKN2A (p14ARF), CDKN2A (p16INK4a), CKD4, CHEK2, CTNNA1, DICER1, EPCAM (Deletion/duplication testing only), GREM1 (promoter region deletion/duplication testing only), KIT, MEN1, MLH1, MSH2, MSH3, MSH6, MU  . High cholesterol   . History of blood transfusion 07/2017   [facility]  . Hypertension   . Non-insulin dependent type 2 diabetes mellitus ([place])   . Runny nose 06/03/2017   clear drainage, per pt.  . SVD (spontaneous vaginal delivery)    x 2    Current Outpatient Medications  Medication Sig Dispense Refill  . ACCU-CHEK SOFTCLIX LANCETS lancets Monitor blood glucose levels by fingerstick once daily 100 each 12  . atorvastatin (LIPITOR) 40 MG tablet Take 1 tablet (40 mg total) by mouth daily. 90 tablet 1  . Blood Glucose Monitoring Suppl (ACCU-CHEK AVIVA PLUS) W/DEVICE KIT Check Blood sugar AM fasting and before dinner 1 kit 0  . cholecalciferol (VITAMIN D) 1000 units tablet Take 1,000 Units by mouth daily.    . clindamycin (CLINDAGEL) 1 % gel Apply topically 2 (two) times daily. 30 g 0  . ferrous sulfate 325 (65 FE) MG tablet Take 1 tablet (325 mg total) by mouth daily before breakfast. 90  tablet 1  . glucose blood (ACCU-CHEK AVIVA) test strip Monitor blood glucose levels by fingerstick once daily 100 each 12  . Lancet Devices (ACCU-CHEK SOFTCLIX) lancets Use as instructed 1 each 0  . lidocaine-prilocaine (EMLA) cream Apply to affected area once 30 g 3  . lisinopril (PRINIVIL,ZESTRIL) 10 MG tablet Take 1 tablet (10 mg total) by mouth daily. 90 tablet 1  . metFORMIN (GLUCOPHAGE XR) 500 MG 24 hr tablet Take 2 tablets (1,000 mg total) by mouth daily with breakfast. 180 tablet 1  . mupirocin cream (BACTROBAN) 2 % Apply 1 application topically 4 (four) times daily. 30 g 2  . Omega-3 Fatty Acids (FISH OIL OMEGA-3 PO) Take 1 capsule by mouth daily.      Current Facility-Administered Medications  Medication Dose Route Frequency Provider Last Rate Last Dose  . 0.9 %  sodium chloride infusion  500 mL Intravenous Once Nandigam, Venia Minks, MD        No Known Allergies    Social History   Socioeconomic History  . Marital status:  Legally Separated    Spouse name: Not on file  . Number of children: Not on file  . Years of education: Not on file  . Highest education level: Not on file  Occupational History  . Not on file  Social Needs  . Financial resource strain: Not on file  . Food insecurity:    Worry: Not on file    Inability: Not on file  . Transportation needs:    Medical: Not on file    Non-medical: Not on file  Tobacco Use  . Smoking status: Never Smoker  . Smokeless tobacco: Never Used  Substance and Sexual Activity  . Alcohol use: No  . Drug use: No  . Sexual activity: Never    Birth control/protection: None  Lifestyle  . Physical activity:    Days per week: Not on file    Minutes per session: Not on file  . Stress: Not on file  Relationships  . Social connections:    Talks on phone: Not on file    Gets together: Not on file    Attends religious service: Not on file    Active member of club or organization: Not on file    Attends meetings of clubs or  organizations: Not on file    Relationship status: Not on file  . Intimate partner violence:    Fear of current or ex partner: Not on file    Emotionally abused: Not on file    Physically abused: Not on file    Forced sexual activity: Not on file  Other Topics Concern  . Not on file  Social History Narrative   1 boy and 1 girl   Resides in [place]   Seperated      Family History  Problem Relation Age of Onset  . Hypertension Mother   . Diabetes Father   . Hypertension Sister   . Diabetes Paternal Grandmother   . Colon cancer Paternal Grandmother 70  . CAD Brother   . Diabetes Brother   . Breast cancer Maternal Aunt        dx >50  . Heart attack Paternal Grandfather   . Breast cancer Maternal Aunt        dx under 38  . Breast cancer Maternal Aunt        dx  under 50    Vitals:   05/31/18 1206  BP: 130/82  Pulse: 82  SpO2: 96%  Weight: 72.4 kg (159 lb 9.6 oz)    PHYSICAL EXAM: General: Well appearing. No resp difficulty. Used sign Nutritional therapist.  HEENT: normal Neck: supple. no JVD. Carotids 2+ bilat; no bruits. No lymphadenopathy or thryomegaly appreciated. Cor: PMI nondisplaced. Regular rate & rhythm. No rubs, gallops or murmurs. + port-cath Lungs: clear Abdomen: soft, nontender, nondistended. No hepatosplenomegaly. No bruits or masses. Good bowel sounds. Extremities: no cyanosis, clubbing, rash, edema Neuro: alert & orientedx3, cranial nerves grossly intact except for hearing. moves all 4 extremities w/o difficulty. Affect pleasant   ASSESSMENT & PLAN:  1. Breast Cancer, left - BRCA+   - T1b N0 stage IA clinical stage  - I reviewed echos personally. EF and Doppler parameters stable. No HF on exam. Continue Herceptin.   - Will complete Hereptin in 10/19. Can f/u on PRN basis  Glori Bickers, MD  11:39 PM

## 2018-06-06 NOTE — Assessment & Plan Note (Addendum)
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  I will see the patient in every 6 weeks She completes her treatment October 15

## 2018-06-07 ENCOUNTER — Inpatient Hospital Stay: Payer: Medicaid Other

## 2018-06-07 ENCOUNTER — Inpatient Hospital Stay (HOSPITAL_BASED_OUTPATIENT_CLINIC_OR_DEPARTMENT_OTHER): Payer: Medicaid Other | Admitting: Hematology and Oncology

## 2018-06-07 DIAGNOSIS — Z171 Estrogen receptor negative status [ER-]: Secondary | ICD-10-CM

## 2018-06-07 DIAGNOSIS — Z90710 Acquired absence of both cervix and uterus: Secondary | ICD-10-CM

## 2018-06-07 DIAGNOSIS — C50512 Malignant neoplasm of lower-outer quadrant of left female breast: Secondary | ICD-10-CM

## 2018-06-07 DIAGNOSIS — C5701 Malignant neoplasm of right fallopian tube: Secondary | ICD-10-CM

## 2018-06-07 DIAGNOSIS — R21 Rash and other nonspecific skin eruption: Secondary | ICD-10-CM | POA: Diagnosis not present

## 2018-06-07 DIAGNOSIS — Z1231 Encounter for screening mammogram for malignant neoplasm of breast: Secondary | ICD-10-CM

## 2018-06-07 DIAGNOSIS — Z5112 Encounter for antineoplastic immunotherapy: Secondary | ICD-10-CM | POA: Diagnosis not present

## 2018-06-07 DIAGNOSIS — Z9221 Personal history of antineoplastic chemotherapy: Secondary | ICD-10-CM

## 2018-06-07 DIAGNOSIS — Z95828 Presence of other vascular implants and grafts: Secondary | ICD-10-CM

## 2018-06-07 DIAGNOSIS — Z1502 Genetic susceptibility to malignant neoplasm of ovary: Secondary | ICD-10-CM

## 2018-06-07 LAB — CBC WITH DIFFERENTIAL (CANCER CENTER ONLY)
Basophils Absolute: 0 10*3/uL (ref 0.0–0.1)
Basophils Relative: 0 %
EOS ABS: 0.1 10*3/uL (ref 0.0–0.5)
Eosinophils Relative: 1 %
HCT: 32.7 % — ABNORMAL LOW (ref 34.8–46.6)
HEMOGLOBIN: 10.8 g/dL — AB (ref 11.6–15.9)
LYMPHS ABS: 2.3 10*3/uL (ref 0.9–3.3)
LYMPHS PCT: 28 %
MCH: 32.8 pg (ref 25.1–34.0)
MCHC: 33 g/dL (ref 31.5–36.0)
MCV: 99.4 fL (ref 79.5–101.0)
Monocytes Absolute: 0.6 10*3/uL (ref 0.1–0.9)
Monocytes Relative: 7 %
Neutro Abs: 5.4 10*3/uL (ref 1.5–6.5)
Neutrophils Relative %: 64 %
Platelet Count: 214 10*3/uL (ref 145–400)
RBC: 3.29 MIL/uL — ABNORMAL LOW (ref 3.70–5.45)
RDW: 12.9 % (ref 11.2–14.5)
WBC: 8.4 10*3/uL (ref 3.9–10.3)

## 2018-06-07 LAB — CMP (CANCER CENTER ONLY)
ALT: 20 U/L (ref 0–44)
AST: 13 U/L — ABNORMAL LOW (ref 15–41)
Albumin: 3.5 g/dL (ref 3.5–5.0)
Alkaline Phosphatase: 92 U/L (ref 38–126)
Anion gap: 10 (ref 5–15)
BUN: 14 mg/dL (ref 6–20)
CO2: 24 mmol/L (ref 22–32)
CREATININE: 0.77 mg/dL (ref 0.44–1.00)
Calcium: 9.3 mg/dL (ref 8.9–10.3)
Chloride: 106 mmol/L (ref 98–111)
GFR, Estimated: >60 mL/min (ref >60)
GLUCOSE: 124 mg/dL — AB (ref 70–99)
Potassium: 3.8 mmol/L (ref 3.5–5.1)
SODIUM: 140 mmol/L (ref 135–145)
Total Bilirubin: <0.2 mg/dL — ABNORMAL LOW (ref 0.3–1.2)
Total Protein: 7.7 g/dL (ref 6.5–8.1)

## 2018-06-07 MED ORDER — SODIUM CHLORIDE 0.9% FLUSH
10.0000 mL | INTRAVENOUS | Status: DC | PRN
  Administered 2018-06-07: 10 mL
  Filled 2018-06-07: qty 10

## 2018-06-07 MED ORDER — DIPHENHYDRAMINE HCL 50 MG/ML IJ SOLN
INTRAMUSCULAR | Status: AC
  Filled 2018-06-07: qty 1

## 2018-06-07 MED ORDER — ACETAMINOPHEN 325 MG PO TABS
650.0000 mg | ORAL_TABLET | Freq: Once | ORAL | Status: AC
  Administered 2018-06-07: 650 mg via ORAL

## 2018-06-07 MED ORDER — SODIUM CHLORIDE 0.9 % IV SOLN
Freq: Once | INTRAVENOUS | Status: AC
  Administered 2018-06-07: 09:00:00 via INTRAVENOUS
  Filled 2018-06-07: qty 250

## 2018-06-07 MED ORDER — ACETAMINOPHEN 325 MG PO TABS
ORAL_TABLET | ORAL | Status: AC
  Filled 2018-06-07: qty 2

## 2018-06-07 MED ORDER — TRASTUZUMAB CHEMO 150 MG IV SOLR
450.0000 mg | Freq: Once | INTRAVENOUS | Status: AC
  Administered 2018-06-07: 450 mg via INTRAVENOUS
  Filled 2018-06-07: qty 21.43

## 2018-06-07 MED ORDER — DIPHENHYDRAMINE HCL 50 MG/ML IJ SOLN
50.0000 mg | Freq: Once | INTRAMUSCULAR | Status: AC
  Administered 2018-06-07: 50 mg via INTRAVENOUS

## 2018-06-07 MED ORDER — DICLOFENAC SODIUM 1 % TD GEL: 2.0000 g | Freq: Two times a day (BID) | TRANSDERMAL | 0 refills | Status: DC

## 2018-06-07 MED ORDER — CLINDAMYCIN PHOSPHATE 1 % EX GEL: Freq: Two times a day (BID) | CUTANEOUS | 1 refills | Status: DC

## 2018-06-07 MED ORDER — HEPARIN SOD (PORK) LOCK FLUSH 100 UNIT/ML IV SOLN
500.0000 [IU] | Freq: Once | INTRAVENOUS | Status: AC | PRN
  Administered 2018-06-07: 500 [IU]
  Filled 2018-06-07: qty 5

## 2018-06-07 MED ORDER — SODIUM CHLORIDE 0.9% FLUSH
10.0000 mL | INTRAVENOUS | Status: DC | PRN
  Administered 2018-06-07: 10 mL via INTRAVENOUS
  Filled 2018-06-07: qty 10

## 2018-06-07 NOTE — Patient Instructions (Signed)
[facility] Discharge Instructions for Patients Receiving Chemotherapy  Today you received the following chemotherapy agents Herceptin  To help prevent nausea and vomiting after your treatment, we encourage you to take your nausea medication as directed   If you develop nausea and vomiting that is not controlled by your nausea medication, call the clinic.   BELOW ARE SYMPTOMS THAT SHOULD BE REPORTED IMMEDIATELY:  *FEVER GREATER THAN 100.5 F  *CHILLS WITH OR WITHOUT FEVER  NAUSEA AND VOMITING THAT IS NOT CONTROLLED WITH YOUR NAUSEA MEDICATION  *UNUSUAL SHORTNESS OF BREATH  *UNUSUAL BRUISING OR BLEEDING  TENDERNESS IN MOUTH AND THROAT WITH OR WITHOUT PRESENCE OF ULCERS  *URINARY PROBLEMS  *BOWEL PROBLEMS  UNUSUAL RASH Items with * indicate a potential emergency and should be followed up as soon as possible.  Feel free to call the clinic should you have any questions or concerns. The clinic phone number is [phone].  Please show the CHEMO ALERT CARD at check-in to the Emergency Department and triage nurse.   

## 2018-06-07 NOTE — Progress Notes (Signed)
Patient Care Team: [name], NP as PCP - General (Nurse Practitioner) Excell Seltzer, MD as Consulting Physician (General Surgery) [name], MD as Consulting Physician (Hematology and Oncology) Kyung Rudd, MD as Consulting Physician (Radiation Oncology)  DIAGNOSIS:  Encounter Diagnosis  Name Primary?  . Malignant neoplasm of lower-outer quadrant of left breast of female, estrogen receptor negative ([place])     SUMMARY OF ONCOLOGIC HISTORY:   Malignant neoplasm of lower-outer quadrant of left breast of female, estrogen receptor negative ([place])   05/11/2017 Initial Diagnosis    Left breast biopsy 3:30 position 6 cm from nipple: IDC with DCIS, lymphovascular invasion present, grade 2-3, ER 0%, PR 0%, HER-2 positive ratio 2.28, Ki-67 20%, 1 cm lesion left breast, T1b N0 stage IA clinical stage    06/09/2017 Surgery    Left lumpectomy: IDC grade 3, 1.2 cm, DCIS is present, margins negative, 0/6 lymph nodes negative, ER 0%, PR 0%, HER-2 positive ratio 2.28, Ki-67 20%, T1 BN 0 stage IA    07/09/2017 -  Adjuvant Chemotherapy    Taxol/Herceptin completed on 09/29/2017, went on to receive every 3 week adjuvant Herceptin on 10/20/2017    02/15/2018 Genetic Testing    The Common Hereditary Cancer Panel offered by [facility] includes sequencing and/or deletion duplication testing of the following 47 genes: APC, ATM, AXIN2, BARD1, BMPR1A, BRCA1, BRCA2, BRIP1, CDH1, CDKN2A (p14ARF), CDKN2A (p16INK4a), CKD4, CHEK2, CTNNA1, DICER1, EPCAM (Deletion/duplication testing only), GREM1 (promoter region deletion/duplication testing only), KIT, MEN1, MLH1, MSH2, MSH3, MSH6, MUTYH, NBN, NF1, NHTL1, PALB2, PDGFRA, PMS2, POLD1, POLE, PTEN, RAD50, RAD51C, RAD51D, SDHB, SDHC, SDHD, SMAD4, SMARCA4. STK11, TP53, TSC1, TSC2, and VHL.  The following genes were evaluated for sequence changes only: SDHA and HOXB13 c.251G>A variant only.   Results: POSITIVE. Pathogenic variant identified in BRCA2 c.4552del  (p.[id number]*25). 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  She has 1 more Herceptin maintenance. She is tolerating it extremely well.  Her acne is much better with clindamycin topical cream.  REVIEW OF SYSTEMS:   Constitutional: Denies fevers, chills or abnormal weight loss Eyes: Denies blurriness of vision Ears, nose, mouth, throat, and face: Denies mucositis or sore throat Respiratory: Denies cough, dyspnea or wheezes Cardiovascular: Denies palpitation, chest discomfort Gastrointestinal:  Denies nausea, heartburn or change in bowel habits Skin: Acne Lymphatics: Denies new lymphadenopathy or easy bruising Neurological:Denies numbness, tingling or new weaknesses Behavioral/Psych: Mood is stable, no new changes  Extremities: No lower extremity edema Breast:  denies any pain or lumps or nodules in either breasts All other systems were reviewed with the patient and are negative.  I have reviewed the past medical history, past surgical history, social history and family history with the patient and they are unchanged from previous note.  ALLERGIES:  has No Known Allergies.  MEDICATIONS:  Current Outpatient Medications  Medication Sig Dispense Refill  . ACCU-CHEK SOFTCLIX LANCETS lancets Monitor  blood glucose levels by fingerstick once daily 100 each 12  . atorvastatin (LIPITOR) 40 MG  tablet Take 1 tablet (40 mg total) by mouth daily. 90 tablet 1  . Blood Glucose Monitoring Suppl (ACCU-CHEK AVIVA PLUS) W/DEVICE KIT Check Blood sugar AM fasting and before dinner 1 kit 0  . cholecalciferol (VITAMIN D) 1000 units tablet Take 1,000 Units by mouth daily.    . clindamycin (CLINDAGEL) 1 % gel Apply topically 2 (two) times daily. 30 g 0  . ferrous sulfate 325 (65 FE) MG tablet Take 1 tablet (325 mg total) by mouth daily before breakfast. 90 tablet 1  . glucose blood (ACCU-CHEK AVIVA) test strip Monitor blood glucose levels by fingerstick once daily 100 each 12  . Lancet Devices (ACCU-CHEK SOFTCLIX) lancets Use as instructed 1 each 0  . lidocaine-prilocaine (EMLA) cream Apply to affected area once 30 g 3  . lisinopril (PRINIVIL,ZESTRIL) 10 MG tablet Take 1 tablet (10 mg total) by mouth daily. 90 tablet 1  . metFORMIN (GLUCOPHAGE XR) 500 MG 24 hr tablet Take 2 tablets (1,000 mg total) by mouth daily with breakfast. 180 tablet 1  . mupirocin cream (BACTROBAN) 2 % Apply 1 application topically 4 (four) times daily. 30 g 2  . Omega-3 Fatty Acids (FISH OIL OMEGA-3 PO) Take 1 capsule by mouth daily.      Current Facility-Administered Medications  Medication Dose Route Frequency Provider Last Rate Last Dose  . 0.9 %  sodium chloride infusion  500 mL Intravenous Once Nandigam, Venia Minks, MD        PHYSICAL EXAMINATION: ECOG PERFORMANCE STATUS: 1 - Symptomatic but completely ambulatory  Vitals:   06/07/18 0809  BP: 121/66  Pulse: 83  Resp: 18  Temp: 98.3 F (36.8 C)  SpO2: 100%   Filed Weights   06/07/18 0809  Weight: 160 lb (72.6 kg)    GENERAL:alert, no distress and comfortable SKIN: skin color, texture, turgor are normal, no rashes or significant lesions EYES: normal, Conjunctiva are pink and non-injected, sclera clear OROPHARYNX:no exudate, no erythema and lips, buccal mucosa, and tongue normal  NECK: supple, thyroid normal size, non-tender, without nodularity LYMPH:  no  palpable lymphadenopathy in the cervical, axillary or inguinal LUNGS: clear to auscultation and percussion with normal breathing effort HEART: regular rate & rhythm and no murmurs and no lower extremity edema ABDOMEN:abdomen soft, non-tender and normal bowel sounds MUSCULOSKELETAL:no cyanosis of digits and no clubbing  NEURO: alert & oriented x 3 with fluent speech, no focal motor/sensory deficits EXTREMITIES: No lower extremity edema  LABORATORY DATA:  I have reviewed the data as listed CMP Latest Ref Rng & Units 05/18/2018 04/26/2018 04/05/2018  Glucose 70 - 99 mg/dL 114(H) 111(H) 109(H)  BUN 6 - 20 mg/dL _0 Creatinine 0.44 - 1.00 mg/dL 0.75 0.72 0.70  Sodium 135 - 145 mmol/L 141 141 141  Potassium 3.5 - 5.1 mmol/L 4.1 3.9 3.9  Chloride 98 - 111 mmol/L 106 106 105  CO2 22 - 32 mmol/L _1 Calcium 8.9 - 10.3 mg/dL 9.4 9.2 9.6  Total Protein 6.5 - 8.1 g/dL 7.7 7.4 7.6  Total Bilirubin 0.3 - 1.2 mg/dL <0.2(L) 0.3 0.3  Alkaline Phos 38 - 126 U/L 89 92 93  AST 15 - 41 U/L _2 ALT 0 - 44 U/L _3 Lab Results  Component Value Date   WBC 9.0 05/18/2018   HGB 10.7 (L) 05/18/2018   HCT 32.7 (  L) 05/18/2018   MCV 99.7 05/18/2018   PLT 184 05/18/2018   NEUTROABS 6.0 05/18/2018    ASSESSMENT & PLAN:  Malignant neoplasm of lower-outer quadrant of left breast of female, estrogen receptor negative (H[facility]) BRCA 2 Positive 05/20/2017: Left lumpectomy: IDC grade 3, 1.2 cm, DCIS is present, margins negative, 0/6 lymph nodes negative, ER 0%, PR 0%, HER-2 positive ratio 2.28, Ki-67 20%, T1 BN 0 stage IA Taxol/Herceptin completed on 09/29/2017, went on to receive every 3 week adjuvant Herceptin on 10/20/2017 BRCA2 mutation: Patient decided not to undergo bilateral mastectomies. 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 Current Treatment: On Herceptin maintenance No toxicities from Herceptin She needs an echocardiogram next month  Acneform rash on the face: I prescribed her clindamycin 1% topical gel Return to clinic in 3 weeks for Herceptin maintenance therapy alone.  I will see the patient in every 6 weeks She completes her treatment October 15 I requested a breast MRI to be done in February 2020  No orders of the defined types were placed in this encounter.  The patient has a good understanding of the overall plan. she agrees with it. she will call with any problems that may develop before the next visit here.   Viinay K , MD 06/07/18    

## 2018-06-28 ENCOUNTER — Other Ambulatory Visit: Payer: Medicaid Other

## 2018-06-28 ENCOUNTER — Inpatient Hospital Stay: Payer: Medicaid Other

## 2018-06-28 ENCOUNTER — Inpatient Hospital Stay: Payer: Medicaid Other | Attending: Hematology and Oncology

## 2018-06-28 ENCOUNTER — Encounter: Payer: Self-pay | Admitting: *Deleted

## 2018-06-28 ENCOUNTER — Telehealth: Payer: Self-pay | Admitting: Adult Health

## 2018-06-28 ENCOUNTER — Inpatient Hospital Stay (HOSPITAL_BASED_OUTPATIENT_CLINIC_OR_DEPARTMENT_OTHER): Payer: Medicaid Other | Admitting: Hematology and Oncology

## 2018-06-28 VITALS — BP 127/74 | HR 80 | Temp 97.7°F | Resp 18 | Ht 61.0 in | Wt 159.2 lb

## 2018-06-28 DIAGNOSIS — C50512 Malignant neoplasm of lower-outer quadrant of left female breast: Secondary | ICD-10-CM

## 2018-06-28 DIAGNOSIS — Z79899 Other long term (current) drug therapy: Secondary | ICD-10-CM

## 2018-06-28 DIAGNOSIS — Z171 Estrogen receptor negative status [ER-]: Secondary | ICD-10-CM | POA: Diagnosis not present

## 2018-06-28 DIAGNOSIS — Z5112 Encounter for antineoplastic immunotherapy: Secondary | ICD-10-CM | POA: Insufficient documentation

## 2018-06-28 DIAGNOSIS — Z7984 Long term (current) use of oral hypoglycemic drugs: Secondary | ICD-10-CM

## 2018-06-28 DIAGNOSIS — D649 Anemia, unspecified: Secondary | ICD-10-CM | POA: Insufficient documentation

## 2018-06-28 DIAGNOSIS — C5701 Malignant neoplasm of right fallopian tube: Secondary | ICD-10-CM

## 2018-06-28 DIAGNOSIS — D508 Other iron deficiency anemias: Secondary | ICD-10-CM

## 2018-06-28 LAB — CBC WITH DIFFERENTIAL (CANCER CENTER ONLY)
ABS IMMATURE GRANULOCYTES: 0.03 10*3/uL (ref 0.00–0.07)
Basophils Absolute: 0 10*3/uL (ref 0.0–0.1)
Basophils Relative: 0 %
EOS ABS: 0.1 10*3/uL (ref 0.0–0.5)
Eosinophils Relative: 1 %
HEMATOCRIT: 34.8 % — AB (ref 36.0–46.0)
Hemoglobin: 11.6 g/dL — ABNORMAL LOW (ref 12.0–15.0)
IMMATURE GRANULOCYTES: 0 %
LYMPHS ABS: 2.4 10*3/uL (ref 0.7–4.0)
Lymphocytes Relative: 26 %
MCH: 32.5 pg (ref 26.0–34.0)
MCHC: 33.3 g/dL (ref 30.0–36.0)
MCV: 97.5 fL (ref 80.0–100.0)
MONOS PCT: 9 %
Monocytes Absolute: 0.8 10*3/uL (ref 0.1–1.0)
NEUTROS ABS: 6.1 10*3/uL (ref 1.7–7.7)
NEUTROS PCT: 64 %
PLATELETS: 250 10*3/uL (ref 150–400)
RBC: 3.57 MIL/uL — ABNORMAL LOW (ref 3.87–5.11)
RDW: 12.4 % (ref 11.5–15.5)
WBC Count: 9.4 10*3/uL (ref 4.0–10.5)
nRBC: 0 % (ref 0.0–0.2)

## 2018-06-28 LAB — COMPREHENSIVE METABOLIC PANEL
ALT: 23 U/L (ref 0–44)
ANION GAP: 9 (ref 5–15)
AST: 19 U/L (ref 15–41)
Albumin: 3.7 g/dL (ref 3.5–5.0)
Alkaline Phosphatase: 83 U/L (ref 38–126)
BUN: 12 mg/dL (ref 6–20)
CHLORIDE: 106 mmol/L (ref 98–111)
CO2: 26 mmol/L (ref 22–32)
Calcium: 9.6 mg/dL (ref 8.9–10.3)
Creatinine, Ser: 0.63 mg/dL (ref 0.44–1.00)
Glucose, Bld: 90 mg/dL (ref 70–99)
POTASSIUM: 4 mmol/L (ref 3.5–5.1)
Sodium: 141 mmol/L (ref 135–145)
TOTAL PROTEIN: 7.9 g/dL (ref 6.5–8.1)
Total Bilirubin: 0.5 mg/dL (ref 0.3–1.2)

## 2018-06-28 MED ORDER — ACETAMINOPHEN 325 MG PO TABS
ORAL_TABLET | ORAL | Status: AC
  Filled 2018-06-28: qty 2

## 2018-06-28 MED ORDER — HEPARIN SOD (PORK) LOCK FLUSH 100 UNIT/ML IV SOLN
500.0000 [IU] | Freq: Once | INTRAVENOUS | Status: AC | PRN
  Administered 2018-06-28: 500 [IU]
  Filled 2018-06-28: qty 5

## 2018-06-28 MED ORDER — DIPHENHYDRAMINE HCL 50 MG/ML IJ SOLN
50.0000 mg | Freq: Once | INTRAMUSCULAR | Status: AC
  Administered 2018-06-28: 50 mg via INTRAVENOUS

## 2018-06-28 MED ORDER — DIPHENHYDRAMINE HCL 50 MG/ML IJ SOLN
INTRAMUSCULAR | Status: AC
  Filled 2018-06-28: qty 1

## 2018-06-28 MED ORDER — TRASTUZUMAB CHEMO 150 MG IV SOLR
450.0000 mg | Freq: Once | INTRAVENOUS | Status: AC
  Administered 2018-06-28: 450 mg via INTRAVENOUS
  Filled 2018-06-28: qty 21.43

## 2018-06-28 MED ORDER — ACETAMINOPHEN 325 MG PO TABS
650.0000 mg | ORAL_TABLET | Freq: Once | ORAL | Status: AC
  Administered 2018-06-28: 650 mg via ORAL

## 2018-06-28 MED ORDER — CLINDAMYCIN PHOSPHATE 1 % EX GEL: Freq: Two times a day (BID) | CUTANEOUS | 1 refills | Status: DC

## 2018-06-28 MED ORDER — SODIUM CHLORIDE 0.9 % IV SOLN
Freq: Once | INTRAVENOUS | Status: AC
  Administered 2018-06-28: 10:00:00 via INTRAVENOUS
  Filled 2018-06-28: qty 250

## 2018-06-28 MED ORDER — SODIUM CHLORIDE 0.9% FLUSH
10.0000 mL | INTRAVENOUS | Status: DC | PRN
  Administered 2018-06-28: 10 mL
  Filled 2018-06-28: qty 10

## 2018-06-28 NOTE — Assessment & Plan Note (Signed)
05/20/2017: Left lumpectomy: IDC grade 3, 1.2 cm, DCIS is present, margins negative, 0/6 lymph nodes negative, ER 0%, PR 0%, HER-2 positive ratio 2.28, Ki-67 20%, T1 BN 0 stage IA Taxol/Herceptin completed on 09/29/2017, went on to receive every 3 week adjuvant Herceptin on 10/20/2017 BRCA2 mutation: Patient decided not to undergo bilateral mastectomies. 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  Current Treatment:On Herceptin maintenance, today's last treatment No toxicities from Herceptin  Acneform rash on the face: I prescribed her clindamycin 1% topical gel  She completed her treatment today I requested a breast MRI to be done in February 2020  Return to clinic in 6 months for follow-up with survivorship

## 2018-06-28 NOTE — Progress Notes (Signed)
Patient Care Team: [name], NP as PCP - General (Nurse Practitioner) Excell Seltzer, MD as Consulting Physician (General Surgery) [name], MD as Consulting Physician (Hematology and Oncology) Kyung Rudd, MD as Consulting Physician (Radiation Oncology)  DIAGNOSIS:  Encounter Diagnosis  Name Primary?  . Malignant neoplasm of lower-outer quadrant of left breast of female, estrogen receptor negative ([place])     SUMMARY OF ONCOLOGIC HISTORY:   Malignant neoplasm of lower-outer quadrant of left breast of female, estrogen receptor negative ([place])   05/11/2017 Initial Diagnosis    Left breast biopsy 3:30 position 6 cm from nipple: IDC with DCIS, lymphovascular invasion present, grade 2-3, ER 0%, PR 0%, HER-2 positive ratio 2.28, Ki-67 20%, 1 cm lesion left breast, T1b N0 stage IA clinical stage    06/09/2017 Surgery    Left lumpectomy: IDC grade 3, 1.2 cm, DCIS is present, margins negative, 0/6 lymph nodes negative, ER 0%, PR 0%, HER-2 positive ratio 2.28, Ki-67 20%, T1 BN 0 stage IA    07/09/2017 -  Adjuvant Chemotherapy    Taxol/Herceptin completed on 09/29/2017, went on to receive every 3 week adjuvant Herceptin on 10/20/2017    02/15/2018 Genetic Testing    The Common Hereditary Cancer Panel offered by [facility] includes sequencing and/or deletion duplication testing of the following 47 genes: APC, ATM, AXIN2, BARD1, BMPR1A, BRCA1, BRCA2, BRIP1, CDH1, CDKN2A (p14ARF), CDKN2A (p16INK4a), CKD4, CHEK2, CTNNA1, DICER1, EPCAM (Deletion/duplication testing only), GREM1 (promoter region deletion/duplication testing only), KIT, MEN1, MLH1, MSH2, MSH3, MSH6, MUTYH, NBN, NF1, NHTL1, PALB2, PDGFRA, PMS2, POLD1, POLE, PTEN, RAD50, RAD51C, RAD51D, SDHB, SDHC, SDHD, SMAD4, SMARCA4. STK11, TP53, TSC1, TSC2, and VHL.  The following genes were evaluated for sequence changes only: SDHA and HOXB13 c.251G>A variant only.   Results: POSITIVE. Pathogenic variant identified in BRCA2 c.4552del  (p.[id number]*25). 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  She is very happy about it and excited to be done with treatment.  She has an appointment to get the port removed.  REVIEW OF SYSTEMS:   Constitutional: Denies fevers, chills or abnormal weight loss Eyes: Denies blurriness of vision Ears, nose, mouth, throat, and face: Denies mucositis or sore throat Respiratory: Denies cough, dyspnea or wheezes Cardiovascular: Denies palpitation, chest discomfort Gastrointestinal:  Denies nausea, heartburn or change in bowel habits Skin: Denies abnormal skin rashes Lymphatics: Denies new lymphadenopathy or easy bruising Neurological:Denies numbness, tingling or new weaknesses Behavioral/Psych: Mood is stable, no new changes  Extremities: No lower extremity edema Breast:  denies any pain or lumps or nodules in either breasts All other systems were reviewed with the patient and are negative.  I have reviewed the past medical history, past surgical history, social history and family history with the patient and they are unchanged from previous note.  ALLERGIES:  has No Known Allergies.  MEDICATIONS:  Current Outpatient Medications  Medication Sig Dispense Refill  .  ACCU-CHEK SOFTCLIX LANCETS lancets Monitor blood glucose levels by fingerstick once daily 100 each 12  . atorvastatin (LIPITOR)  40 MG tablet Take 1 tablet (40 mg total) by mouth daily. 90 tablet 1  . Blood Glucose Monitoring Suppl (ACCU-CHEK AVIVA PLUS) W/DEVICE KIT Check Blood sugar AM fasting and before dinner 1 kit 0  . cholecalciferol (VITAMIN D) 1000 units tablet Take 1,000 Units by mouth daily.    . clindamycin (CLINDAGEL) 1 % gel Apply topically 2 (two) times daily. 30 g 1  . diclofenac sodium (VOLTAREN) 1 % GEL Apply 2 g topically 2 (two) times daily. 1 Tube 0  . ferrous sulfate 325 (65 FE) MG tablet Take 1 tablet (325 mg total) by mouth daily before breakfast. 90 tablet 1  . glucose blood (ACCU-CHEK AVIVA) test strip Monitor blood glucose levels by fingerstick once daily 100 each 12  . Lancet Devices (ACCU-CHEK SOFTCLIX) lancets Use as instructed 1 each 0  . lidocaine-prilocaine (EMLA) cream Apply to affected area once 30 g 3  . lisinopril (PRINIVIL,ZESTRIL) 10 MG tablet Take 1 tablet (10 mg total) by mouth daily. 90 tablet 1  . metFORMIN (GLUCOPHAGE XR) 500 MG 24 hr tablet Take 2 tablets (1,000 mg total) by mouth daily with breakfast. 180 tablet 1  . Omega-3 Fatty Acids (FISH OIL OMEGA-3 PO) Take 1 capsule by mouth daily.      Current Facility-Administered Medications  Medication Dose Route Frequency Provider Last Rate Last Dose  . 0.9 %  sodium chloride infusion  500 mL Intravenous Once Nandigam, Venia Minks, MD        PHYSICAL EXAMINATION: ECOG PERFORMANCE STATUS: 1 - Symptomatic but completely ambulatory  Vitals:   06/28/18 0910  BP: 127/74  Pulse: 80  Resp: 18  Temp: 97.7 F (36.5 C)  SpO2: 100%   Filed Weights   06/28/18 0910  Weight: 159 lb 3.2 oz (72.2 kg)    GENERAL:alert, no distress and comfortable SKIN: skin color, texture, turgor are normal, no rashes or significant lesions EYES: normal, Conjunctiva are pink and non-injected, sclera clear OROPHARYNX:no exudate, no erythema and lips, buccal mucosa, and tongue normal  NECK: supple, thyroid normal size, non-tender, without  nodularity LYMPH:  no palpable lymphadenopathy in the cervical, axillary or inguinal LUNGS: clear to auscultation and percussion with normal breathing effort HEART: regular rate & rhythm and no murmurs and no lower extremity edema ABDOMEN:abdomen soft, non-tender and normal bowel sounds MUSCULOSKELETAL:no cyanosis of digits and no clubbing  NEURO: alert & oriented x 3 with fluent speech, no focal motor/sensory deficits EXTREMITIES: No lower extremity edema   LABORATORY DATA:  I have reviewed the data as listed CMP Latest Ref Rng & Units 06/07/2018 05/18/2018 04/26/2018  Glucose 70 - 99 mg/dL 124(H) 114(H) 111(H)  BUN 6 - 20 mg/dL _0 Creatinine 0.44 - 1.00 mg/dL 0.77 0.75 0.72  Sodium 135 - 145 mmol/L 140 141 141  Potassium 3.5 - 5.1 mmol/L 3.8 4.1 3.9  Chloride 98 - 111 mmol/L 106 106 106  CO2 22 - 32 mmol/L _1 Calcium 8.9 - 10.3 mg/dL 9.3 9.4 9.2  Total Protein 6.5 - 8.1 g/dL 7.7 7.7 7.4  Total Bilirubin 0.3 - 1.2 mg/dL <0.2(L) <0.2(L) 0.3  Alkaline Phos 38 - 126 U/L 92 89 92  AST 15 - 41 U/L 13(L) 15 17  ALT 0 - 44 U/L _2 Lab Results  Component Value Date   WBC 8.4 06/07/2018  HGB 10.8 (L) 06/07/2018   HCT 32.7 (L) 06/07/2018   MCV 99.4 06/07/2018   PLT 214 06/07/2018   NEUTROABS 5.4 06/07/2018    ASSESSMENT & PLAN:  Malignant neoplasm of lower-outer quadrant of left breast of female, estrogen receptor negative ([place]) 05/20/2017: Left lumpectomy: IDC grade 3, 1.2 cm, DCIS is present, margins negative, 0/6 lymph nodes negative, ER 0%, PR 0%, HER-2 positive ratio 2.28, Ki-67 20%, T1 BN 0 stage IA Taxol/Herceptin completed on 09/29/2017, went on to receive every 3 week adjuvant Herceptin on 10/20/2017 BRCA2 mutation: Patient decided not to undergo bilateral mastectomies. She wishes to follow annually with mammograms and breast MRIs  Fallopian tube cancer, carcinoma, right ([place]) Hysterectomy with fallopian tube surgery: Fallopian tube with serous carcinoma  0.6 cm positive for p53, PAX8, WT-1, MOC-31, cytokeratin 7, and estrogen receptor Prior Treatment: Taxol and carboplatin added to Herceptin treatment every 3 weeks.completed 3 cycles (Taxol discontinued after cycle 1) Last echo on 11/18/2017: EF 55-60%, followed by cardiology.  Current Treatment:On Herceptin maintenance, today's last treatment No toxicities from Herceptin  Acneform rash on the face: I prescribed her clindamycin 1% topical gel  She completed her treatment today I requested a breast MRI to be done in February 2020  Normocytic anemia: Related to prior chemotherapy.  I would like to obtain a CBC with differential along with iron studies X28 and folic acid when she comes back to see [place] in 3 months for survivorship care plan. Return to clinic in 3 months for follow-up with survivorship    No orders of the defined types were placed in this encounter.  The patient has a good understanding of the overall plan. she agrees with it. she will call with any problems that may develop before the next visit here.   Harriette Ohara, MD 06/28/18

## 2018-06-28 NOTE — Patient Instructions (Signed)

## 2018-06-28 NOTE — Patient Instructions (Signed)
[facility] Discharge Instructions for Patients Receiving Chemotherapy  Today you received the following chemotherapy agents Herceptin  To help prevent nausea and vomiting after your treatment, we encourage you to take your nausea medication as directed   If you develop nausea and vomiting that is not controlled by your nausea medication, call the clinic.   BELOW ARE SYMPTOMS THAT SHOULD BE REPORTED IMMEDIATELY:  *FEVER GREATER THAN 100.5 F  *CHILLS WITH OR WITHOUT FEVER  NAUSEA AND VOMITING THAT IS NOT CONTROLLED WITH YOUR NAUSEA MEDICATION  *UNUSUAL SHORTNESS OF BREATH  *UNUSUAL BRUISING OR BLEEDING  TENDERNESS IN MOUTH AND THROAT WITH OR WITHOUT PRESENCE OF ULCERS  *URINARY PROBLEMS  *BOWEL PROBLEMS  UNUSUAL RASH Items with * indicate a potential emergency and should be followed up as soon as possible.  Feel free to call the clinic should you have any questions or concerns. The clinic phone number is [phone].  Please show the CHEMO ALERT CARD at check-in to the Emergency Department and triage nurse.   

## 2018-06-28 NOTE — Telephone Encounter (Signed)
Gave avs and calendar ° °

## 2018-08-18 ENCOUNTER — Telehealth: Payer: Self-pay | Admitting: *Deleted

## 2018-08-18 NOTE — Telephone Encounter (Signed)
Medical records faxed to [facility]; release [phone]

## 2018-08-29 ENCOUNTER — Ambulatory Visit: Payer: Medicaid Other | Admitting: Nurse Practitioner

## 2018-09-01 DIAGNOSIS — E119 Type 2 diabetes mellitus without complications: Secondary | ICD-10-CM | POA: Diagnosis not present

## 2018-09-01 DIAGNOSIS — E785 Hyperlipidemia, unspecified: Secondary | ICD-10-CM | POA: Diagnosis not present

## 2018-09-28 ENCOUNTER — Telehealth: Payer: Self-pay

## 2018-09-28 NOTE — Telephone Encounter (Signed)
LVM for patient reminding of SCP visit with NP on 10/04/18 at 10 am.  Center number left for call back if needed.  Patient is deaf and message left through interpretor line.

## 2018-10-04 ENCOUNTER — Telehealth: Payer: Self-pay | Admitting: Hematology and Oncology

## 2018-10-04 ENCOUNTER — Inpatient Hospital Stay: Payer: Medicaid Other | Attending: Hematology and Oncology

## 2018-10-04 ENCOUNTER — Inpatient Hospital Stay (HOSPITAL_BASED_OUTPATIENT_CLINIC_OR_DEPARTMENT_OTHER): Payer: Medicaid Other | Admitting: Adult Health

## 2018-10-04 ENCOUNTER — Telehealth: Payer: Self-pay | Admitting: *Deleted

## 2018-10-04 ENCOUNTER — Encounter: Payer: Self-pay | Admitting: Adult Health

## 2018-10-04 VITALS — BP 132/70 | HR 86 | Temp 97.8°F | Resp 18 | Ht 61.0 in | Wt 161.5 lb

## 2018-10-04 DIAGNOSIS — I10 Essential (primary) hypertension: Secondary | ICD-10-CM | POA: Insufficient documentation

## 2018-10-04 DIAGNOSIS — Z7984 Long term (current) use of oral hypoglycemic drugs: Secondary | ICD-10-CM | POA: Insufficient documentation

## 2018-10-04 DIAGNOSIS — Z9221 Personal history of antineoplastic chemotherapy: Secondary | ICD-10-CM | POA: Insufficient documentation

## 2018-10-04 DIAGNOSIS — M1990 Unspecified osteoarthritis, unspecified site: Secondary | ICD-10-CM | POA: Insufficient documentation

## 2018-10-04 DIAGNOSIS — Z171 Estrogen receptor negative status [ER-]: Secondary | ICD-10-CM

## 2018-10-04 DIAGNOSIS — Z800 Family history of malignant neoplasm of digestive organs: Secondary | ICD-10-CM

## 2018-10-04 DIAGNOSIS — Z90722 Acquired absence of ovaries, bilateral: Secondary | ICD-10-CM | POA: Insufficient documentation

## 2018-10-04 DIAGNOSIS — C5701 Malignant neoplasm of right fallopian tube: Secondary | ICD-10-CM | POA: Diagnosis not present

## 2018-10-04 DIAGNOSIS — Z1509 Genetic susceptibility to other malignant neoplasm: Secondary | ICD-10-CM | POA: Diagnosis not present

## 2018-10-04 DIAGNOSIS — Z79899 Other long term (current) drug therapy: Secondary | ICD-10-CM | POA: Insufficient documentation

## 2018-10-04 DIAGNOSIS — Z90710 Acquired absence of both cervix and uterus: Secondary | ICD-10-CM | POA: Diagnosis not present

## 2018-10-04 DIAGNOSIS — Z1502 Genetic susceptibility to malignant neoplasm of ovary: Secondary | ICD-10-CM | POA: Diagnosis not present

## 2018-10-04 DIAGNOSIS — Z923 Personal history of irradiation: Secondary | ICD-10-CM

## 2018-10-04 DIAGNOSIS — C50512 Malignant neoplasm of lower-outer quadrant of left female breast: Secondary | ICD-10-CM | POA: Diagnosis present

## 2018-10-04 DIAGNOSIS — Z1501 Genetic susceptibility to malignant neoplasm of breast: Secondary | ICD-10-CM | POA: Diagnosis not present

## 2018-10-04 DIAGNOSIS — E119 Type 2 diabetes mellitus without complications: Secondary | ICD-10-CM | POA: Insufficient documentation

## 2018-10-04 DIAGNOSIS — C50912 Malignant neoplasm of unspecified site of left female breast: Secondary | ICD-10-CM | POA: Diagnosis not present

## 2018-10-04 DIAGNOSIS — Z803 Family history of malignant neoplasm of breast: Secondary | ICD-10-CM | POA: Insufficient documentation

## 2018-10-04 DIAGNOSIS — D508 Other iron deficiency anemias: Secondary | ICD-10-CM

## 2018-10-04 LAB — CBC WITH DIFFERENTIAL (CANCER CENTER ONLY)
Abs Immature Granulocytes: 0.04 10*3/uL (ref 0.00–0.07)
BASOS PCT: 0 %
Basophils Absolute: 0 10*3/uL (ref 0.0–0.1)
EOS ABS: 0.1 10*3/uL (ref 0.0–0.5)
Eosinophils Relative: 1 %
HCT: 35.2 % — ABNORMAL LOW (ref 36.0–46.0)
Hemoglobin: 11.6 g/dL — ABNORMAL LOW (ref 12.0–15.0)
Immature Granulocytes: 1 %
Lymphocytes Relative: 27 %
Lymphs Abs: 2.2 10*3/uL (ref 0.7–4.0)
MCH: 31.4 pg (ref 26.0–34.0)
MCHC: 33 g/dL (ref 30.0–36.0)
MCV: 95.1 fL (ref 80.0–100.0)
Monocytes Absolute: 0.7 10*3/uL (ref 0.1–1.0)
Monocytes Relative: 9 %
NEUTROS PCT: 62 %
Neutro Abs: 5.2 10*3/uL (ref 1.7–7.7)
PLATELETS: 244 10*3/uL (ref 150–400)
RBC: 3.7 MIL/uL — AB (ref 3.87–5.11)
RDW: 13.9 % (ref 11.5–15.5)
WBC: 8.3 10*3/uL (ref 4.0–10.5)
nRBC: 0 % (ref 0.0–0.2)

## 2018-10-04 LAB — CMP (CANCER CENTER ONLY)
ALK PHOS: 95 U/L (ref 38–126)
ALT: 33 U/L (ref 0–44)
ANION GAP: 10 (ref 5–15)
AST: 17 U/L (ref 15–41)
Albumin: 3.7 g/dL (ref 3.5–5.0)
BUN: 10 mg/dL (ref 6–20)
CALCIUM: 9.3 mg/dL (ref 8.9–10.3)
CO2: 25 mmol/L (ref 22–32)
Chloride: 104 mmol/L (ref 98–111)
Creatinine: 0.72 mg/dL (ref 0.44–1.00)
GFR, Estimated: >60 mL/min (ref >60)
Glucose, Bld: 132 mg/dL — ABNORMAL HIGH (ref 70–99)
Potassium: 3.8 mmol/L (ref 3.5–5.1)
Sodium: 139 mmol/L (ref 135–145)
Total Bilirubin: 0.4 mg/dL (ref 0.3–1.2)
Total Protein: 7.7 g/dL (ref 6.5–8.1)

## 2018-10-04 LAB — IRON AND TIBC
Iron: 109 ug/dL (ref 41–142)
SATURATION RATIOS: 44 % (ref 21–57)
TIBC: 249 ug/dL (ref 236–444)
UIBC: 140 ug/dL (ref 120–384)

## 2018-10-04 LAB — VITAMIN B12: VITAMIN B 12: 269 pg/mL (ref 180–914)

## 2018-10-04 LAB — FOLATE: Folate: 13.1 ng/mL (ref >5.9)

## 2018-10-04 LAB — FERRITIN: Ferritin: 316 ng/mL — ABNORMAL HIGH (ref 11–307)

## 2018-10-04 NOTE — Telephone Encounter (Signed)
Gave avs and calendar ° °

## 2018-10-04 NOTE — Progress Notes (Signed)
CLINIC:  Survivorship   REASON FOR VISIT:  Routine follow-up post-treatment for a recent history of breast cancer.  BRIEF ONCOLOGIC HISTORY:    Malignant neoplasm of lower-outer quadrant of left breast of female, estrogen receptor negative ([place])   05/11/2017 Initial Diagnosis    Left breast biopsy 3:30 position 6 cm from nipple: IDC with DCIS, lymphovascular invasion present, grade 2-3, ER 0%, PR 0%, HER-2 positive ratio 2.28, Ki-67 20%, 1 cm lesion left breast, T1b N0 stage IA clinical stage    06/09/2017 Surgery    Left lumpectomy: IDC grade 3, 1.2 cm, DCIS is present, margins negative, 0/6 lymph nodes negative, ER 0%, PR 0%, HER-2 positive ratio 2.28, Ki-67 20%, T1 BN 0 stage IA    07/09/2017 - 09/29/2017 Adjuvant Chemotherapy    Taxol/Herceptin x 12 completed on 09/29/2017, went on to receive every 3 week adjuvant Herceptin through 06/28/2018    11/25/2017 - 01/07/2018 Radiation Therapy     The patient initially received a dose of 50.4 Gy in 28 fractions to the breast using whole-breast tangent fields. This was delivered using a 3-D conformal technique. The patient then received a boost to the seroma. This delivered an additional 10 Gy in 5 fractions using a 3-D technique. The total dose was 60.4 Gy.    02/15/2018 Genetic Testing    The Common Hereditary Cancer Panel offered by [facility] includes sequencing and/or deletion duplication testing of the following 47 genes: APC, ATM, AXIN2, BARD1, BMPR1A, BRCA1, BRCA2, BRIP1, CDH1, CDKN2A (p14ARF), CDKN2A (p16INK4a), CKD4, CHEK2, CTNNA1, DICER1, EPCAM (Deletion/duplication testing only), GREM1 (promoter region deletion/duplication testing only), KIT, MEN1, MLH1, MSH2, MSH3, MSH6, MUTYH, NBN, NF1, NHTL1, PALB2, PDGFRA, PMS2, POLD1, POLE, PTEN, RAD50, RAD51C, RAD51D, SDHB, SDHC, SDHD, SMAD4, SMARCA4. STK11, TP53, TSC1, TSC2, and VHL.  The following genes were evaluated for sequence changes only: SDHA and HOXB13 c.251G>A variant only.   Results:  POSITIVE. 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 The date of this test report is 02/15/2018     Fallopian tube cancer, carcinoma, right ([place])   11/04/2017 Surgery    Uterus, cervix and bilateral fallopian tubes: Fallopian tube with serous carcinoma 0.6 cm positive for p53, PAX8, WT-1, MOC-31, cytokeratin 7, and estrogen receptor    01/11/2018 -  Chemotherapy    Taxol and carboplatin every 3 weeks (Taxol discontinued with cycle 2 because of profound rash)      INTERVAL HISTORY:  Ms. Hirota presents to the [facility] today for our initial meeting to review her survivorship care plan detailing her treatment course for breast cancer, as well as monitoring long-term side effects of that treatment, education regarding health maintenance, screening, and overall wellness and health promotion.     Overall, Ms. Tague reports feeling quite well.  She has noted itching x 3-4 weeks in breast and under arm.  She has some soreness as well.  She has no other issues today.    She does want to know what her follow up should look like for her fallopian tube carcinoma and if any further imaging is recommended.       REVIEW OF SYSTEMS:  Review of Systems  Constitutional: Negative for appetite change, chills, fatigue, fever and unexpected weight change.  HENT:   Negative for hearing loss, lump/mass, mouth sores, sore throat and trouble swallowing.   Eyes: Negative for eye problems and icterus.  Respiratory: Negative for chest tightness, cough and shortness of breath.   Cardiovascular: Negative for chest pain, leg swelling and palpitations.  Gastrointestinal: Negative for abdominal distention, abdominal pain, constipation, diarrhea, nausea and vomiting.  Endocrine: Negative for hot flashes.  Musculoskeletal: Negative for arthralgias.  Skin: Negative for rash.  Neurological: Negative for dizziness, extremity weakness, headaches and numbness.  Hematological: Negative for  adenopathy. Does not bruise/bleed easily.  Psychiatric/Behavioral: Negative for depression. The patient is not nervous/anxious.   Breast: Denies any new nodularity, masses, tenderness, nipple changes, or nipple discharge.      ONCOLOGY TREATMENT TEAM:  1. Surgeon:  Dr. Excell Seltzer at [facility] 2. Medical Oncologist: Dr. [name]  3. Radiation Oncologist: Dr. Lisbeth Renshaw    PAST MEDICAL/SURGICAL HISTORY:  Past Medical History:  Diagnosis Date  . Anemia   . Arthritis    knees, elbows  . BRCA2 gene mutation positive 02/17/2018   BRCA2 c.4552del (p.Glu1518Asnfs*25)  Result reported out on 02/15/2018.   . Breast cancer ([place]) 05/2017   left  . Cough 06/03/2017  . Deaf   . Depression   . Dyspnea    states some SOB with ADLs and aanemia   . Family history of breast cancer   . Family history of breast cancer   . Family history of colon cancer   . Genetic testing 02/17/2018   The Common Hereditary Cancer Panel offered by [facility] includes sequencing and/or deletion duplication testing of the following 47 genes: APC, ATM, AXIN2, BARD1, BMPR1A, BRCA1, BRCA2, BRIP1, CDH1, CDKN2A (p14ARF), CDKN2A (p16INK4a), CKD4, CHEK2, CTNNA1, DICER1, EPCAM (Deletion/duplication testing only), GREM1 (promoter region deletion/duplication testing only), KIT, MEN1, MLH1, MSH2, MSH3, MSH6, MU  . High cholesterol   . History of blood transfusion 07/2017   [facility]  . Hypertension   . Non-insulin dependent type 2 diabetes mellitus ([place])   . Runny nose 06/03/2017   clear drainage, per pt.  . SVD (spontaneous vaginal delivery)    x 2   Past Surgical History:  Procedure Laterality Date  . BREAST LUMPECTOMY Left 06/10/2007  . BREAST LUMPECTOMY WITH RADIOACTIVE SEED AND SENTINEL LYMPH NODE BIOPSY Left 06/09/2017   Procedure: BREAST LUMPECTOMY WITH RADIOACTIVE SEED AND SENTINEL LYMPH NODE BIOPSY;  Surgeon: Excell Seltzer, MD;  Location: [facility];  Service: General;  Laterality: Left;  .  COLONOSCOPY  11/02/2017   polyps  . LAPAROSCOPIC ASSISTED VAGINAL HYSTERECTOMY N/A 11/04/2017   Procedure: LAPAROSCOPIC ASSISTED VAGINAL HYSTERECTOMY;  Surgeon: [name], MD;  Location: [facility];  Service: Gynecology;  Laterality: N/A;  . LAPAROSCOPIC BILATERAL SALPINGO OOPHERECTOMY Bilateral 11/04/2017   Procedure: LAPAROSCOPIC BILATERAL SALPINGO OOPHORECTOMY;  Surgeon: [name], MD;  Location: [facility];  Service: Gynecology;  Laterality: Bilateral;  . MULTIPLE TOOTH EXTRACTIONS    . PORTACATH PLACEMENT Right 06/09/2017   Procedure: INSERTION PORT-A-CATH WITH [place];  Surgeon: Excell Seltzer, MD;  Location: [facility];  Service: General;  Laterality: Right;  . TUBAL LIGATION  02/04/2002  . UPPER GI ENDOSCOPY       ALLERGIES:  No Known Allergies   CURRENT MEDICATIONS:  Outpatient Encounter Medications as of 10/04/2018  Medication Sig  . ACCU-CHEK SOFTCLIX LANCETS lancets Monitor blood glucose levels by fingerstick once daily  . atorvastatin (LIPITOR) 40 MG tablet Take 1 tablet (40 mg total) by mouth daily.  . Blood Glucose Monitoring Suppl (ACCU-CHEK AVIVA PLUS) W/DEVICE KIT Check Blood sugar AM fasting and before dinner  . cholecalciferol (VITAMIN D) 1000 units tablet Take 1,000 Units by mouth daily.  . clindamycin (CLINDAGEL) 1 % gel Apply topically 2 (two) times daily.  . diclofenac sodium (VOLTAREN) 1 %  GEL Apply 2 g topically 2 (two) times daily.  Marland Kitchen glucose blood (ACCU-CHEK AVIVA) test strip Monitor blood glucose levels by fingerstick once daily  . Lancet Devices (ACCU-CHEK SOFTCLIX) lancets Use as instructed  . metFORMIN (GLUCOPHAGE XR) 500 MG 24 hr tablet Take 2 tablets (1,000 mg total) by mouth daily with breakfast.  . Omega-3 Fatty Acids (FISH OIL OMEGA-3 PO) Take 1 capsule by mouth daily.   . ferrous sulfate 325 (65 FE) MG tablet Take 1 tablet (325 mg total) by mouth daily before breakfast.  . lisinopril (PRINIVIL,ZESTRIL) 10 MG tablet Take 1 tablet (10 mg  total) by mouth daily.   Facility-Administered Encounter Medications as of 10/04/2018  Medication  . 0.9 %  sodium chloride infusion     ONCOLOGIC FAMILY HISTORY:  Family History  Problem Relation Age of Onset  . Hypertension Mother   . Diabetes Father   . Hypertension Sister   . Diabetes Paternal Grandmother   . Colon cancer Paternal Grandmother 37  . CAD Brother   . Diabetes Brother   . Breast cancer Maternal Aunt        dx >50  . Heart attack Paternal Grandfather   . Breast cancer Maternal Aunt        dx under 45  . Breast cancer Maternal Aunt        dx  under 50     GENETIC COUNSELING/TESTING: BRCA 2 positive  SOCIAL HISTORY:  Social History   Socioeconomic History  . Marital status: Legally Separated    Spouse name: Not on file  . Number of children: Not on file  . Years of education: Not on file  . Highest education level: Not on file  Occupational History  . Not on file  Social Needs  . Financial resource strain: Not on file  . Food insecurity:    Worry: Not on file    Inability: Not on file  . Transportation needs:    Medical: Not on file    Non-medical: Not on file  Tobacco Use  . Smoking status: Never Smoker  . Smokeless tobacco: Never Used  Substance and Sexual Activity  . Alcohol use: No  . Drug use: No  . Sexual activity: Never    Birth control/protection: None  Lifestyle  . Physical activity:    Days per week: Not on file    Minutes per session: Not on file  . Stress: Not on file  Relationships  . Social connections:    Talks on phone: Not on file    Gets together: Not on file    Attends religious service: Not on file    Active member of club or organization: Not on file    Attends meetings of clubs or organizations: Not on file    Relationship status: Not on file  . Intimate partner violence:    Fear of current or ex partner: Not on file    Emotionally abused: Not on file    Physically abused: Not on file    Forced sexual  activity: Not on file  Other Topics Concern  . Not on file  Social History Narrative   1 boy and 1 girl   Resides in [place]:  Vital Signs:   Vitals:   10/04/18 0944  BP: 132/70  Pulse: 86  Resp: 18  Temp: 97.8 F (36.6 C)  SpO2: 100%   Filed Weights   10/04/18 0944  Weight: 161  lb 8 oz (73.3 kg)   General: Well-nourished, well-appearing female in no acute distress.  She is accompanied by her mother and a sign language interpreter today.   HEENT: Head is normocephalic.  Pupils equal and reactive to light. Conjunctivae clear without exudate.  Sclerae anicteric. Oral mucosa is pink, moist.  Oropharynx is pink without lesions or erythema.  Lymph: No cervical, supraclavicular, or infraclavicular lymphadenopathy noted on palpation.  Cardiovascular: Regular rate and rhythm.Marland Kitchen Respiratory: Clear to auscultation bilaterally. Chest expansion symmetric; breathing non-labored.  Breast: right breast benign, left breast s/p lumpectomy and radiation, no sign of local recurrence GI: Abdomen soft and round; non-tender, non-distended. Bowel sounds normoactive.  GU: Deferred.  Neuro: No focal deficits. Steady gait.  Psych: Mood and affect normal and appropriate for situation.  Extremities: No edema. MSK: No focal spinal tenderness to palpation.  Full range of motion in bilateral upper extremities Skin: Warm and dry.  LABORATORY DATA:  None for this visit.  DIAGNOSTIC IMAGING:  None for this visit.      ASSESSMENT AND PLAN:  Ms.. Ferriss is a pleasant 58 y.o. female with Stage IA left breast invasive ductal carcinoma, ER-/PR-/HER2+, diagnosed in 04/2017, treated with lumpectomy, adjuvant chemotherapy, adjuvant radiation therapy, and maintenance trastuzumab x 1 year.  After her breast cancer diagnosis, she was diagnosed with right fallopian tube carcinoma and underwent TAH/BSO and received one cycle of Taxol (stopped early due to rash) and 3 cycles  of Carboplatin.  She presents to the Survivorship Clinic for our initial meeting and routine follow-up post-completion of treatment for breast cancer.    1. Stage IA left breast cancer:  Ms. Neer is continuing to recover from definitive treatment for breast cancer. She will follow-up with her medical oncologist, Dr. [name] in 3-6 months with history and physical exam per surveillance protocol.   Today, a comprehensive survivorship care plan and treatment summary was reviewed with the patient today detailing her breast cancer diagnosis, treatment course, potential late/long-term effects of treatment, appropriate follow-up care with recommendations for the future, and patient education resources.  A copy of this summary, along with a letter will be sent to the patient's primary care provider via mail/fax/In Basket message after today's visit.    2. Fallopian Tube Carcinoma: s/p TAH and BSO, along with adjuvant chemotherapy that was stopped early due to inability to tolerate.  She had eval by Dr. Denman George and CT chest/abdomen/pelvis on 01/12/2018 that showed no residual or metastatic disease.  I let Illianna know that I am unsure what her f/u with Dr. Denman George or if any routine imaging is indicated.  I have given Reola's name to the gyn oncology NP, Joylene John to let me know what their recommendations are.  Saphronia knows that I will call her mother with these recommendations.  Her bilateral breast mammogram was in August, and her MRI   3. BRCA 2 positivity: Patient tested positive in 02/2018.  We reviewed recommendations in detail.  Per Dr. [name] he does not think risk reduction with anti estrogen therapy is indicated.  She will undergo intensified screening with breast MRI annually alternating with mammogram annually, with 6 month interval between the testing.  She has no family history of pancreatic cancer, therefore screening with MRCP is not indicated.  We reviewed the possible increase in melanoma.  I recommended  annual skin exams by dermatology and UV protection.  She has already undergone TAH/BSO. [place] is aware of these recommendations.  Her bilateral breast mammogram was in August of  2019, therefore her MRI breast is scheduled on 11/07/2018.    4. Bone health:  Given Ms. Shuler's age/history of breast cancer, she is at risk for bone demineralization.  She was given education on specific activities to promote bone health.  5. Cancer screening:  Due to Ms. Begley's history and her age, she should receive screening for skin cancers, colon cancer.  Please see her BRCA 2 bullet #3 above in regards for additional cancer screening recommendations.  The information and recommendations are listed on the patient's comprehensive care plan/treatment summary and were reviewed in detail with the patient.    6. Health maintenance and wellness promotion: Ms. Foulk was encouraged to consume 5-7 servings of fruits and vegetables per day. We reviewed the "Nutrition Rainbow" handout, as well as the handout "Take Control of Your Health and Reduce Your Cancer Risk" from the [employer].  She was also encouraged to engage in moderate to vigorous exercise for 30 minutes per day most days of the week. We discussed the [employer] fitness program, which is designed for cancer survivors to help them become more physically fit after cancer treatments.  She was instructed to limit her alcohol consumption and continue to abstain from tobacco use.     7. Support services/counseling: It is not uncommon for this period of the patient's cancer care trajectory to be one of many emotions and stressors.  We discussed an opportunity for her to participate in the next session of [facility] ("Finding Your New Normal") support group series designed for patients after they have completed treatment.   Ms. Aguinaga was encouraged to take advantage of our many other support services programs, support groups, and/or counseling in coping with her new  life as a cancer survivor after completing anti-cancer treatment.  She was offered support today through active listening and expressive supportive counseling.  She was given information regarding our available services and encouraged to contact me with any questions or for help enrolling in any of our support group/programs.    Dispo:   -Return to cancer center for f/u with Dr. [name] in 3 months (after breast MRI) -Mammogram due in 04/2019 -Breast MRI due 10/2018 -Follow up with Dr. Excell Seltzer today.   -She is welcome to return back to the Survivorship Clinic at any time; no additional follow-up needed at this time.  -Consider referral back to survivorship as a long-term survivor for continued surveillance  A total of (30) minutes of face-to-face time was spent with this patient with greater than 50% of that time in counseling and care-coordination.   [name], NP Survivorship Program [facility] [phone]   Note: PRIMARY CARE PROVIDER [name], [place] [phone]

## 2018-10-04 NOTE — Telephone Encounter (Signed)
Attempted to contact the patient, left a message for the patient to call the office back. The patient needs to be scheduled to see Dr. Denman George

## 2018-10-05 ENCOUNTER — Telehealth: Payer: Self-pay | Admitting: *Deleted

## 2018-10-05 NOTE — Telephone Encounter (Signed)
Attempted to contact the patient to schedule a follow up appt to see Dr. Denman George. Left a message for the patient to call the office back.

## 2018-10-07 ENCOUNTER — Telehealth: Payer: Self-pay | Admitting: *Deleted

## 2018-10-07 NOTE — Telephone Encounter (Signed)
Called and spoke with the patient's mother, scheduled the appt to see Dr. Denman George on 1/31

## 2018-10-14 ENCOUNTER — Inpatient Hospital Stay (HOSPITAL_BASED_OUTPATIENT_CLINIC_OR_DEPARTMENT_OTHER): Payer: Medicaid Other | Admitting: Gynecologic Oncology

## 2018-10-14 ENCOUNTER — Encounter: Payer: Self-pay | Admitting: Gynecologic Oncology

## 2018-10-14 VITALS — BP 131/72 | HR 104 | Temp 98.3°F | Resp 20 | Ht 61.0 in | Wt 156.0 lb

## 2018-10-14 DIAGNOSIS — Z90722 Acquired absence of ovaries, bilateral: Secondary | ICD-10-CM

## 2018-10-14 DIAGNOSIS — Z90710 Acquired absence of both cervix and uterus: Secondary | ICD-10-CM | POA: Diagnosis not present

## 2018-10-14 DIAGNOSIS — C5701 Malignant neoplasm of right fallopian tube: Secondary | ICD-10-CM

## 2018-10-14 DIAGNOSIS — C50512 Malignant neoplasm of lower-outer quadrant of left female breast: Secondary | ICD-10-CM | POA: Diagnosis not present

## 2018-10-14 NOTE — Patient Instructions (Signed)
Please notify Dr Jensen Kilburg at phone number [phone] if you notice vaginal bleeding, new pelvic or abdominal pains, bloating, feeling full easy, or a change in bladder or bowel function.   Please return to see Dr Antuane Eastridge in 3 months. 

## 2018-10-14 NOTE — Progress Notes (Signed)
Consult Note: Gyn-Onc  Consult was requested by Dr. Kennon Rounds for the evaluation of Erin [name] 58 y.o. female  CC:  Chief Complaint  Patient presents with  . Fallopian tube cancer, carcinoma, right [facility])    Assessment/Plan:  Erin [name]  is a 58 y.o.  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   S/p 6 cycles adjuvant carboplatin/taxol chemotherapy between April 2019-October, 2020.   Complete clinical response.   Maintenance olaparib/parp inhibitor not tested in this early stage population, therefore recommending transitioning to surveillance care with 3 monthly evaluations.   HPI: Erin [name] is a 58 year old woman who is deaf and is seen in consultation at the request of Dr Kennon Rounds for clinical stage IC fallopian tube cancer.   The patient has a history of ER PR negative HER-2 positive breast cancer on the left side which was diagnosed in August 2018.  It was treated with lumpectomy in September 2018 followed by Adriamycin and Taxol chemotherapy.  She was then placed on Herceptin with a plan to commence radiation therapy in March 2019.  She reported menorrhagia with irregular menses and therefore transvaginal ultrasound was performed on June 29, 2017.  It showed a uterus measuring 11.8 x 5.2 x 6.3 cm with a septated fluid-filled structure adjacent to the dome of the uterus.  The endometrium was thickened at 10.2 mm the right ovary measured 3.2 x 2.1 x 2.7 cm and the left ovary measured 9.8 x 7.8 x 11.8 cm.  The ovarian tissue is largely occupied by a complex cyst measuring 7.5 x 9.9 x 9.6 cm with a prominent septation.  There was no free fluid identified.  To follow-up with this ultrasound Dr. Kennon Rounds obtained an MRI of the pelvis on September 06, 2017 which revealed a uterus of normal size measuring 7.5 x 5 x 4.5 cm.  There is a bilobed  cystic lesion presumably originating from the left adnexa which is different from the ultrasound report of a right adnexal complex cyst.  The cystic complex measures 12.8 x 10.9 x 8 cm.  The there was no nodularity.  The right ovary appeared normal.  It had no worrisome features.  Ca1 25 was drawn which was normal.  The patient was taken to the operating room on November 04, 2017 for laparoscopic assisted vaginal hysterectomy with BSO.  Surgical findings were significant for an 8-week size uterus with normal adnexa on the left and the right adnexal mass measured personally 12 cm.  It felt to be most consistent with a paratubal cyst.  There is normal upper abdomen reported washings were not obtained.  Surgery was uncomplicated.  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  The remainder of the entire right fallopian tube was submitted and no additional malignancy was identified.  The the associated ovarian cyst on the right was benign the left adnexa was unremarkable.  The uterus was also normal including the endometrium.  The patient is healed well from her surgery with no complaints.  She has a paternal grandmother who had colon cancer, a maternal half aunt with history of breast cancer, 2 paternal aunts with breast cancer.  There is no family history of ovarian cancer.  She has had 2 prior NSVDs  in the past.  She is otherwise fairly healthy.  Interval Hx:  She underwent genetic testing in June, 2019 which revealed a deleterious mutation in BRCA 2.   She received 6 cycles of carboplatin and paclitaxel chemotherapy between April, 2019 and October 2019.  She is on Herceptin maintenance therapy for her breast cancer.   Current Meds:  Outpatient Encounter Medications as of 10/14/2018  Medication Sig  . ACCU-CHEK SOFTCLIX LANCETS lancets Monitor blood glucose levels by fingerstick once  daily  . atorvastatin (LIPITOR) 40 MG tablet Take 1 tablet (40 mg total) by mouth daily.  . Blood Glucose Monitoring Suppl (ACCU-CHEK AVIVA PLUS) W/DEVICE KIT Check Blood sugar AM fasting and before dinner  . cholecalciferol (VITAMIN D) 1000 units tablet Take 1,000 Units by mouth daily.  . clindamycin (CLINDAGEL) 1 % gel Apply topically 2 (two) times daily.  . diclofenac sodium (VOLTAREN) 1 % GEL Apply 2 g topically 2 (two) times daily.  Marland Kitchen glucose blood (ACCU-CHEK AVIVA) test strip Monitor blood glucose levels by fingerstick once daily  . Lancet Devices (ACCU-CHEK SOFTCLIX) lancets Use as instructed  . metFORMIN (GLUCOPHAGE XR) 500 MG 24 hr tablet Take 2 tablets (1,000 mg total) by mouth daily with breakfast.  . Omega-3 Fatty Acids (FISH OIL OMEGA-3 PO) Take 1 capsule by mouth daily.   . ferrous sulfate 325 (65 FE) MG tablet Take 1 tablet (325 mg total) by mouth daily before breakfast.  . lisinopril (PRINIVIL,ZESTRIL) 10 MG tablet Take 1 tablet (10 mg total) by mouth daily.   Facility-Administered Encounter Medications as of 10/14/2018  Medication  . 0.9 %  sodium chloride infusion    Allergy: No Known Allergies  Social Hx:   Social History   Socioeconomic History  . Marital status: Legally Separated    Spouse name: Not on file  . Number of children: Not on file  . Years of education: Not on file  . Highest education level: Not on file  Occupational History  . Not on file  Social Needs  . Financial resource strain: Not on file  . Food insecurity:    Worry: Not on file    Inability: Not on file  . Transportation needs:    Medical: Not on file    Non-medical: Not on file  Tobacco Use  . Smoking status: Never Smoker  . Smokeless tobacco: Never Used  Substance and Sexual Activity  . Alcohol use: No  . Drug use: No  . Sexual activity: Never    Birth control/protection: None  Lifestyle  . Physical activity:    Days per week: Not on file    Minutes per session: Not on file   . Stress: Not on file  Relationships  . Social connections:    Talks on phone: Not on file    Gets together: Not on file    Attends religious service: Not on file    Active member of club or organization: Not on file    Attends meetings of clubs or organizations: Not on file    Relationship status: Not on file  . Intimate partner violence:    Fear of current or ex partner: Not on file    Emotionally abused: Not on file    Physically abused: Not on file    Forced sexual activity: Not on file  Other Topics Concern  . Not on file  Social History Narrative   1 boy and 1 girl   Resides in [place]   Seperated  Past Surgical Hx:  Past Surgical History:  Procedure Laterality Date  . BREAST LUMPECTOMY Left 06/10/2007  . BREAST LUMPECTOMY WITH RADIOACTIVE SEED AND SENTINEL LYMPH NODE BIOPSY Left 06/09/2017   Procedure: BREAST LUMPECTOMY WITH RADIOACTIVE SEED AND SENTINEL LYMPH NODE BIOPSY;  Surgeon: Excell Seltzer, MD;  Location: [place];  Service: General;  Laterality: Left;  . COLONOSCOPY  11/02/2017   polyps  . LAPAROSCOPIC ASSISTED VAGINAL HYSTERECTOMY N/A 11/04/2017   Procedure: LAPAROSCOPIC ASSISTED VAGINAL HYSTERECTOMY;  Surgeon: [name], MD;  Location: [facility];  Service: Gynecology;  Laterality: N/A;  . LAPAROSCOPIC BILATERAL SALPINGO OOPHERECTOMY Bilateral 11/04/2017   Procedure: LAPAROSCOPIC BILATERAL SALPINGO OOPHORECTOMY;  Surgeon: [name], MD;  Location: [facility];  Service: Gynecology;  Laterality: Bilateral;  . MULTIPLE TOOTH EXTRACTIONS    . PORTACATH PLACEMENT Right 06/09/2017   Procedure: INSERTION PORT-A-CATH WITH [place];  Surgeon: Excell Seltzer, MD;  Location: [place];  Service: General;  Laterality: Right;  . TUBAL LIGATION  02/04/2002  . UPPER GI ENDOSCOPY      Past Medical Hx:  Past Medical History:  Diagnosis Date  . Anemia   . Arthritis    knees, elbows  . BRCA2 gene mutation positive 02/17/2018   BRCA2  c.4552del (p.Glu1518Asnfs*25)  Result reported out on 02/15/2018.   . Breast cancer ([place]) 05/2017   left  . Cough 06/03/2017  . Deaf   . Depression   . Dyspnea    states some SOB with ADLs and aanemia   . Family history of breast cancer   . Family history of breast cancer   . Family history of colon cancer   . Genetic testing 02/17/2018   The Common Hereditary Cancer Panel offered by [facility] includes sequencing and/or deletion duplication testing of the following 47 genes: APC, ATM, AXIN2, BARD1, BMPR1A, BRCA1, BRCA2, BRIP1, CDH1, CDKN2A (p14ARF), CDKN2A (p16INK4a), CKD4, CHEK2, CTNNA1, DICER1, EPCAM (Deletion/duplication testing only), GREM1 (promoter region deletion/duplication testing only), KIT, MEN1, MLH1, MSH2, MSH3, MSH6, MU  . High cholesterol   . History of blood transfusion 07/2017   [facility]  . Hypertension   . Non-insulin dependent type 2 diabetes mellitus ([place])   . Runny nose 06/03/2017   clear drainage, per pt.  . SVD (spontaneous vaginal delivery)    x 2    Past Gynecological History:  SVDx 2, s/p hysterectomy Patient's last menstrual period was 07/09/2017 (lmp unknown).  Family Hx:  Family History  Problem Relation Age of Onset  . Hypertension Mother   . Diabetes Father   . Hypertension Sister   . Diabetes Paternal Grandmother   . Colon cancer Paternal Grandmother 80  . CAD Brother   . Diabetes Brother   . Breast cancer Maternal Aunt        dx >50  . Heart attack Paternal Grandfather   . Breast cancer Maternal Aunt        dx under 39  . Breast cancer Maternal Aunt        dx  under 50    Review of Systems:  Constitutional  Feels well,    ENT Normal appearing ears and nares bilaterally Skin/Breast  No rash, sores, jaundice, itching, dryness Cardiovascular  No chest pain, shortness of breath, or edema  Pulmonary  No cough or wheeze.  Gastro Intestinal  No nausea, vomitting, or diarrhoea. No bright red blood per rectum, no abdominal pain, change in bowel  movement, or constipation.  Genito Urinary  No frequency, urgency, dysuria,  Musculo Skeletal  No myalgia, arthralgia, joint swelling or pain  Neurologic  No weakness, numbness, change in gait,  Psychology  No depression, anxiety, insomnia.   Vitals:  Blood pressure 131/72, pulse (!) 104, temperature 98.3 F (36.8 C), temperature source Oral, resp. rate 20, height _0  (1.549 m), weight 156 lb (70.8 kg), last menstrual period 07/09/2017, SpO2 100 %.  Physical Exam: WD in NAD Neck  Supple NROM, without any enlargements.  Lymph Node Survey No cervical supraclavicular or inguinal adenopathy Cardiovascular  Pulse normal rate, regularity and rhythm. S1 and S2 normal.  Lungs  Clear to auscultation bilateraly, without wheezes/crackles/rhonchi. Good air movement.  Skin  No rash/lesions/breakdown  Psychiatry  Alert and oriented to person, place, and time  Abdomen  Normoactive bowel sounds, abdomen soft, non-tender and obese without evidence of hernia. Well healed incisions. Back No CVA tenderness Genito Urinary  No palpable masses in vagina or pelvis. Rectal  deferred Extremities  No bilateral cyanosis, clubbing or edema.   Erin Solo, MD  10/14/2018, 11:31 AM

## 2018-11-02 ENCOUNTER — Ambulatory Visit: Payer: Medicaid Other | Admitting: Family Medicine

## 2018-11-07 ENCOUNTER — Ambulatory Visit (HOSPITAL_COMMUNITY)
Admission: RE | Admit: 2018-11-07 | Discharge: 2018-11-07 | Disposition: A | Discharge status: Home / Self Care | Payer: Medicaid Other | Source: Ambulatory Visit | Attending: Hematology and Oncology | Admitting: Hematology and Oncology

## 2018-11-07 DIAGNOSIS — Z1231 Encounter for screening mammogram for malignant neoplasm of breast: Secondary | ICD-10-CM | POA: Diagnosis present

## 2018-11-07 MED ORDER — GADOBUTROL 1 MMOL/ML IV SOLN
7.5000 mL | Freq: Once | INTRAVENOUS | Status: AC | PRN
  Administered 2018-11-07: 7.5 mL via INTRAVENOUS

## 2018-11-08 ENCOUNTER — Other Ambulatory Visit: Payer: Self-pay | Admitting: Hematology and Oncology

## 2018-11-08 ENCOUNTER — Telehealth: Payer: Self-pay | Admitting: *Deleted

## 2018-11-08 ENCOUNTER — Other Ambulatory Visit: Payer: Self-pay | Admitting: *Deleted

## 2018-11-08 DIAGNOSIS — C50512 Malignant neoplasm of lower-outer quadrant of left female breast: Secondary | ICD-10-CM

## 2018-11-08 DIAGNOSIS — Z171 Estrogen receptor negative status [ER-]: Secondary | ICD-10-CM

## 2018-11-08 DIAGNOSIS — N6310 Unspecified lump in the right breast, unspecified quadrant: Secondary | ICD-10-CM

## 2018-11-08 NOTE — Telephone Encounter (Signed)
Called pt and discussed MRI results and Dr. Geralyn Flash recommendations for MRI bx of 2 areas in right breast on 11/14/18 arrive at 8am. Confirmed appt with pt via video phone. Contact information provided for questions or needs.

## 2018-11-09 ENCOUNTER — Telehealth: Payer: Self-pay | Admitting: *Deleted

## 2018-11-09 NOTE — Telephone Encounter (Signed)
Spoke to pt clarifying MRI bx and post MM for clip placement on 3/2. Discussed walking program and [employer] referral as well as diet. Pt denies further questions or needs at this time.  Left vm for Lauren with [employer] to verify referral has been received.

## 2018-11-14 ENCOUNTER — Emergency Department (HOSPITAL_COMMUNITY): Payer: Medicaid Other

## 2018-11-14 ENCOUNTER — Ambulatory Visit: Payer: Medicaid Other

## 2018-11-14 ENCOUNTER — Encounter (HOSPITAL_COMMUNITY): Payer: Self-pay | Admitting: Emergency Medicine

## 2018-11-14 ENCOUNTER — Ambulatory Visit
Admission: RE | Admit: 2018-11-14 | Discharge: 2018-11-14 | Disposition: A | Discharge status: Home / Self Care | Payer: Medicaid Other | Source: Ambulatory Visit | Attending: Hematology and Oncology | Admitting: Hematology and Oncology

## 2018-11-14 ENCOUNTER — Emergency Department (HOSPITAL_COMMUNITY)
Admission: EM | Admit: 2018-11-14 | Discharge: 2018-11-14 | Disposition: A | Discharge status: Home / Self Care | Payer: Medicaid Other | Attending: Emergency Medicine | Admitting: Emergency Medicine

## 2018-11-14 ENCOUNTER — Other Ambulatory Visit: Payer: Self-pay

## 2018-11-14 DIAGNOSIS — B9789 Other viral agents as the cause of diseases classified elsewhere: Secondary | ICD-10-CM | POA: Insufficient documentation

## 2018-11-14 DIAGNOSIS — C50512 Malignant neoplasm of lower-outer quadrant of left female breast: Secondary | ICD-10-CM

## 2018-11-14 DIAGNOSIS — Z79899 Other long term (current) drug therapy: Secondary | ICD-10-CM | POA: Insufficient documentation

## 2018-11-14 DIAGNOSIS — Z853 Personal history of malignant neoplasm of breast: Secondary | ICD-10-CM | POA: Diagnosis not present

## 2018-11-14 DIAGNOSIS — Z8544 Personal history of malignant neoplasm of other female genital organs: Secondary | ICD-10-CM | POA: Diagnosis not present

## 2018-11-14 DIAGNOSIS — Z171 Estrogen receptor negative status [ER-]: Secondary | ICD-10-CM

## 2018-11-14 DIAGNOSIS — E119 Type 2 diabetes mellitus without complications: Secondary | ICD-10-CM | POA: Diagnosis not present

## 2018-11-14 DIAGNOSIS — Z7984 Long term (current) use of oral hypoglycemic drugs: Secondary | ICD-10-CM | POA: Diagnosis not present

## 2018-11-14 DIAGNOSIS — N6310 Unspecified lump in the right breast, unspecified quadrant: Secondary | ICD-10-CM

## 2018-11-14 DIAGNOSIS — R05 Cough: Secondary | ICD-10-CM | POA: Diagnosis not present

## 2018-11-14 DIAGNOSIS — J069 Acute upper respiratory infection, unspecified: Secondary | ICD-10-CM | POA: Insufficient documentation

## 2018-11-14 DIAGNOSIS — I10 Essential (primary) hypertension: Secondary | ICD-10-CM | POA: Diagnosis not present

## 2018-11-14 MED ORDER — GUAIFENESIN ER 600 MG PO TB12: 600.0000 mg | ORAL_TABLET | Freq: Two times a day (BID) | ORAL | 0 refills | Status: AC | PRN

## 2018-11-14 MED ORDER — BENZONATATE 100 MG PO CAPS: 100.0000 mg | ORAL_CAPSULE | Freq: Three times a day (TID) | ORAL | 0 refills | Status: DC | PRN

## 2018-11-14 NOTE — ED Notes (Signed)
Patient states she has had a productive cough (yellow/brown). Last week patient felt more congested in the upper. Denies sore throat now but has gotten better. Denies copd or asthma. Hx. Temporary asthma.

## 2018-11-14 NOTE — ED Provider Notes (Signed)
[facility] DEPT Provider Note   CSN: [id number] Arrival date & time: 11/14/18  0930    History   Chief Complaint Chief Complaint  Patient presents with  . Cough  . Nasal Congestion    HPI Erin [name] is a 58 y.o. female.     HPI  58 year old female presents with cough and congestion.  History is taken with the sign language interpreter on the iPad.  The patient has been feeling poorly for a couple days.  She had a mild sore throat illness a couple weeks ago that she thinks she got from her grandchildren.  Now she is having cough with some productive sputum without blood.  Some sore throat but is not bad.  Also nasal and chest congestion.  No fevers.  No leg swelling.  Has not taken any meds besides her chronic medicines.  Past Medical History:  Diagnosis Date  . Anemia   . Arthritis    knees, elbows  . BRCA2 gene mutation positive 02/17/2018   BRCA2 c.4552del (p.Glu1518Asnfs*25)  Result reported out on 02/15/2018.   . Breast cancer ([place]) 05/2017   left  . Cough 06/03/2017  . Deaf   . Depression   . Dyspnea    states some SOB with ADLs and aanemia   . Family history of breast cancer   . Family history of breast cancer   . Family history of colon cancer   . Genetic testing 02/17/2018   The Common Hereditary Cancer Panel offered by [facility] includes sequencing and/or deletion duplication testing of the following 47 genes: APC, ATM, AXIN2, BARD1, BMPR1A, BRCA1, BRCA2, BRIP1, CDH1, CDKN2A (p14ARF), CDKN2A (p16INK4a), CKD4, CHEK2, CTNNA1, DICER1, EPCAM (Deletion/duplication testing only), GREM1 (promoter region deletion/duplication testing only), KIT, MEN1, MLH1, MSH2, MSH3, MSH6, MU  . High cholesterol   . History of blood transfusion 07/2017   [facility]  . Hypertension   . Non-insulin dependent type 2 diabetes mellitus ([place])   . Runny nose 06/03/2017   clear drainage, per pt.  . SVD (spontaneous vaginal delivery)    x 2    Patient Active Problem  List   Diagnosis Date Noted  . BRCA2 gene mutation positive 02/17/2018  . Genetic testing 02/17/2018  . Family history of breast cancer   . Family history of colon cancer   . Fallopian tube cancer, carcinoma, right ([place]) 11/22/2017  . S/P vaginal hysterectomy 11/04/2017  . Ovarian mass, left 08/26/2017  . Menorrhagia 08/26/2017  . Port-A-Cath in place 07/23/2017  . Malignant neoplasm of lower-outer quadrant of left breast of female, estrogen receptor negative ([place]) 05/13/2017  . Abdominal bloating 04/02/2017  . Chronic right-sided low back pain without sciatica 07/02/2016  . DM type 2 (diabetes mellitus, type 2) ([place]) 11/18/2014  . Intermittent constipation 06/06/2010  . UNSPECIFIED IRON DEFICIENCY ANEMIA 04/22/2010  . DEPRESSION 04/10/2010  . DEAFNESS 04/10/2010    Past Surgical History:  Procedure Laterality Date  . BREAST LUMPECTOMY Left 06/10/2007  . BREAST LUMPECTOMY WITH RADIOACTIVE SEED AND SENTINEL LYMPH NODE BIOPSY Left 06/09/2017   Procedure: BREAST LUMPECTOMY WITH RADIOACTIVE SEED AND SENTINEL LYMPH NODE BIOPSY;  Surgeon: Excell Seltzer, MD;  Location: [place];  Service: General;  Laterality: Left;  . COLONOSCOPY  11/02/2017   polyps  . LAPAROSCOPIC ASSISTED VAGINAL HYSTERECTOMY N/A 11/04/2017   Procedure: LAPAROSCOPIC ASSISTED VAGINAL HYSTERECTOMY;  Surgeon: [name], MD;  Location: [facility];  Service: Gynecology;  Laterality: N/A;  . LAPAROSCOPIC BILATERAL SALPINGO OOPHERECTOMY Bilateral 11/04/2017  Procedure: LAPAROSCOPIC BILATERAL SALPINGO OOPHORECTOMY;  Surgeon: [name], MD;  Location: [facility];  Service: Gynecology;  Laterality: Bilateral;  . MULTIPLE TOOTH EXTRACTIONS    . PORTACATH PLACEMENT Right 06/09/2017   Procedure: INSERTION PORT-A-CATH WITH [place];  Surgeon: Excell Seltzer, MD;  Location: [facility];  Service: General;  Laterality: Right;  . TUBAL LIGATION  02/04/2002  . UPPER GI ENDOSCOPY       OB History     Gravida  3   Para  2   Term      Preterm      AB  1   Living  2     SAB  1   TAB      Ectopic      Multiple      Live Births               Home Medications    Prior to Admission medications   Medication Sig Start Date End Date Taking? Authorizing Provider  ACCU-CHEK SOFTCLIX LANCETS lancets Monitor blood glucose levels by fingerstick once daily 05/30/18   [name], NP  atorvastatin (LIPITOR) 40 MG tablet Take 1 tablet (40 mg total) by mouth daily. 05/30/18   [name], NP  benzonatate (TESSALON) 100 MG capsule Take 1 capsule (100 mg total) by mouth 3 (three) times daily as needed for cough. 11/14/18   Sherwood Gambler, MD  Blood Glucose Monitoring Suppl (ACCU-CHEK AVIVA PLUS) W/DEVICE KIT Check Blood sugar AM fasting and before dinner 12/17/14   Chari Manning A, NP  cholecalciferol (VITAMIN D) 1000 units tablet Take 1,000 Units by mouth daily.    [provider]  clindamycin (CLINDAGEL) 1 % gel Apply topically 2 (two) times daily. 06/28/18   [name], MD  diclofenac sodium (VOLTAREN) 1 % GEL Apply 2 g topically 2 (two) times daily. 06/07/18   [name], MD  ferrous sulfate 325 (65 FE) MG tablet Take 1 tablet (325 mg total) by mouth daily before breakfast. 05/03/18 08/01/18  [name], NP  glucose blood (ACCU-CHEK AVIVA) test strip Monitor blood glucose levels by fingerstick once daily 05/30/18   [name], NP  guaiFENesin (MUCINEX) 600 MG 12 hr tablet Take 1 tablet (600 mg total) by mouth 2 (two) times daily as needed for up to 7 days. 11/14/18 11/21/18  Sherwood Gambler, MD  Lancet Devices [facility]) lancets Use as instructed 12/17/14   Lance Bosch, NP  lisinopril (PRINIVIL,ZESTRIL) 10 MG tablet Take 1 tablet (10 mg total) by mouth daily. 05/30/18 08/28/18  [name], NP  metFORMIN (GLUCOPHAGE XR) 500 MG 24 hr tablet Take 2 tablets (1,000 mg total) by mouth daily with breakfast. 05/30/18   [name], NP  Omega-3  Fatty Acids (FISH OIL OMEGA-3 PO) Take 1 capsule by mouth daily.     [provider]    Family History Family History  Problem Relation Age of Onset  . Hypertension Mother   . Diabetes Father   . Hypertension Sister   . Diabetes Paternal Grandmother   . Colon cancer Paternal Grandmother 65  . CAD Brother   . Diabetes Brother   . Breast cancer Maternal Aunt        dx >50  . Heart attack Paternal Grandfather   . Breast cancer Maternal Aunt        dx under 28  . Breast cancer Maternal Aunt        dx  under  50    Social History Social History   Tobacco Use  . Smoking status: Never Smoker  . Smokeless tobacco: Never Used  Substance Use Topics  . Alcohol use: No  . Drug use: No     Allergies   Patient has no known allergies.   Review of Systems Review of Systems  Constitutional: Negative for fever.  HENT: Positive for congestion and sore throat.   Respiratory: Positive for cough and shortness of breath.   Cardiovascular: Negative for leg swelling.  Gastrointestinal: Negative for vomiting.  All other systems reviewed and are negative.    Physical Exam Updated Vital Signs BP 126/85 (BP Location: Right Arm)   Pulse 92   Temp 98.4 F (36.9 C) (Oral)   Resp 18   LMP 07/09/2017 (LMP Unknown)   SpO2 98%   Physical Exam Vitals signs and nursing note reviewed.  Constitutional:      General: She is not in acute distress.    Appearance: She is well-developed. She is not ill-appearing or diaphoretic.  HENT:     Head: Normocephalic and atraumatic.     Right Ear: External ear normal.     Left Ear: External ear normal.     Nose: Nose normal.  Eyes:     General:        Right eye: No discharge.        Left eye: No discharge.  Cardiovascular:     Rate and Rhythm: Normal rate and regular rhythm.     Heart sounds: Normal heart sounds.  Pulmonary:     Effort: Pulmonary effort is normal.     Breath sounds: Normal breath sounds. No wheezing, rhonchi or rales.   Abdominal:     Palpations: Abdomen is soft.     Tenderness: There is no abdominal tenderness.  Skin:    General: Skin is warm and dry.  Neurological:     Mental Status: She is alert.  Psychiatric:        Mood and Affect: Mood is not anxious.      ED Treatments / Results  Labs (all labs ordered are listed, but only abnormal results are displayed) Labs Reviewed - No data to display  EKG None  Radiology Dg Chest 2 View  Result Date: 11/14/2018 CLINICAL DATA:  58 year old female with productive cough for 3 days. Recent sick contacts. EXAM: CHEST - 2 VIEW COMPARISON:  Chest abdomen and pelvis CT 01/12/2018 and earlier. FINDINGS: Mildly lower lung volumes. Mediastinal contours remain normal. Visualized tracheal air column is within normal limits. No pneumothorax, pleural effusion, pulmonary edema or confluent pulmonary opacity. No acute osseous abnormality identified. Negative visible bowel gas pattern. IMPRESSION: Lower lung volumes.  No acute cardiopulmonary abnormality. Electronically Signed   By: Genevie Ann M.D.   On: 11/14/2018 10:44    Procedures Procedures (including critical care time)  Medications Ordered in ED Medications - No data to display   Initial Impression / Assessment and Plan / ED Course  I have reviewed the triage vital signs and the nursing notes.  Pertinent labs & imaging results that were available during my care of the patient were reviewed by me and considered in my medical decision making (see chart for details).        Patient is well-appearing.  There appears to be no obvious bacterial illness.  Likely has a viral URI.  I will prescribe her symptomatic care but she otherwise appears stable for supportive care as an outpatient.  She feels mildly  short of breath but does not have increased work of breathing or hypoxia on exam here.  Highly doubt ACS, PE, or invasive bacterial illness.  Final Clinical Impressions(s) / ED Diagnoses   Final diagnoses:    Viral upper respiratory tract infection with cough    ED Discharge Orders         Ordered    benzonatate (TESSALON) 100 MG capsule  3 times daily PRN     11/14/18 1220    guaiFENesin (MUCINEX) 600 MG 12 hr tablet  2 times daily PRN     11/14/18 1220           Sherwood Gambler, MD 11/14/18 1247

## 2018-11-14 NOTE — ED Triage Notes (Signed)
Pt c/o productive cough, not feeling well and congestion for three days. Patient reports was around grandson who was sick.

## 2018-11-14 NOTE — Discharge Instructions (Signed)
If you develop high fever, vomiting, trouble breathing, coughing up blood, or any other new/concerning symptoms then return to the ER for evaluation.

## 2018-11-22 ENCOUNTER — Ambulatory Visit: Payer: Medicaid Other | Admitting: Critical Care Medicine

## 2018-11-23 ENCOUNTER — Ambulatory Visit: Admission: RE | Admit: 2018-11-23 | Payer: Medicaid Other | Source: Ambulatory Visit

## 2018-11-23 ENCOUNTER — Ambulatory Visit: Payer: Medicaid Other

## 2018-11-23 ENCOUNTER — Other Ambulatory Visit: Payer: Self-pay

## 2018-11-23 MED ORDER — ONDANSETRON HCL 8 MG PO TABS: 8.0000 mg | ORAL_TABLET | Freq: Three times a day (TID) | ORAL | 0 refills | Status: DC | PRN

## 2018-11-23 NOTE — Progress Notes (Signed)
Pt was at [facility] imaging for her biopsy. Unable to complete d/t patient getting nauseated and throwing up. Pt had to be rescheduled. Pt requesting medication for nausea when she goes back for her appt. Ok per MD and obtained zofran po as needed.

## 2018-11-24 ENCOUNTER — Other Ambulatory Visit: Payer: Self-pay

## 2018-11-24 MED ORDER — LORAZEPAM 0.5 MG PO TABS: 0.5000 mg | ORAL_TABLET | Freq: Three times a day (TID) | ORAL | 0 refills | Status: DC

## 2018-11-24 NOTE — Progress Notes (Signed)
Pt requesting to have something for anxiety for her upcoming breast biopsy that is scheduled at [facility] imaging. Obtained ativan po as needed per Dr.Gudena. Confirmed pharmacy and will send it today.

## 2018-11-25 ENCOUNTER — Telehealth: Payer: Self-pay | Admitting: Family Medicine

## 2018-11-25 NOTE — Telephone Encounter (Signed)
Patient's call returned and identified, through an interpreter, with name and date of birth.  Patient is taking metformin for her diabetes but has developed a cough and wanted to know what medications would interfere with it.   Nursing advise that in cough suppressants the medicine phenylephrine is listed as a moderate interaction with the metformin causing the metformins effectiveness to be reduced.  Through the interpreter patient acknowledged understanding of information.  Patient requested and e-mail so she could confirm spelling of the medication.  E-mail was sent to the address she supplied.

## 2018-11-25 NOTE — Telephone Encounter (Signed)
New Message   Pt states she has a cough and she wants to know what she can take with her diabetic medications. Please f/u

## 2018-11-28 ENCOUNTER — Ambulatory Visit
Admission: RE | Admit: 2018-11-28 | Discharge: 2018-11-28 | Disposition: A | Discharge status: Home / Self Care | Payer: Medicaid Other | Source: Ambulatory Visit | Attending: Hematology and Oncology | Admitting: Hematology and Oncology

## 2018-11-28 ENCOUNTER — Other Ambulatory Visit: Payer: Self-pay | Admitting: Hematology and Oncology

## 2018-11-28 ENCOUNTER — Other Ambulatory Visit: Payer: Self-pay

## 2018-11-28 ENCOUNTER — Ambulatory Visit: Admission: RE | Admit: 2018-11-28 | Payer: Medicaid Other | Source: Ambulatory Visit

## 2018-11-28 DIAGNOSIS — Z171 Estrogen receptor negative status [ER-]: Secondary | ICD-10-CM

## 2018-11-28 DIAGNOSIS — C50512 Malignant neoplasm of lower-outer quadrant of left female breast: Secondary | ICD-10-CM

## 2018-11-28 DIAGNOSIS — N6310 Unspecified lump in the right breast, unspecified quadrant: Secondary | ICD-10-CM

## 2018-11-28 DIAGNOSIS — N6011 Diffuse cystic mastopathy of right breast: Secondary | ICD-10-CM | POA: Diagnosis not present

## 2018-11-28 MED ORDER — GADOBUTROL 1 MMOL/ML IV SOLN
9.0000 mL | Freq: Once | INTRAVENOUS | Status: AC | PRN
  Administered 2018-11-28: 9 mL via INTRAVENOUS

## 2018-12-02 ENCOUNTER — Other Ambulatory Visit: Payer: Self-pay

## 2018-12-02 ENCOUNTER — Emergency Department (HOSPITAL_COMMUNITY)
Admission: EM | Admit: 2018-12-02 | Discharge: 2018-12-02 | Disposition: A | Discharge status: Home / Self Care | Payer: Medicaid Other | Attending: Emergency Medicine | Admitting: Emergency Medicine

## 2018-12-02 ENCOUNTER — Encounter (HOSPITAL_COMMUNITY): Payer: Self-pay | Admitting: Emergency Medicine

## 2018-12-02 ENCOUNTER — Emergency Department (HOSPITAL_COMMUNITY): Payer: Medicaid Other

## 2018-12-02 DIAGNOSIS — Z7984 Long term (current) use of oral hypoglycemic drugs: Secondary | ICD-10-CM | POA: Insufficient documentation

## 2018-12-02 DIAGNOSIS — I10 Essential (primary) hypertension: Secondary | ICD-10-CM | POA: Insufficient documentation

## 2018-12-02 DIAGNOSIS — R61 Generalized hyperhidrosis: Secondary | ICD-10-CM | POA: Insufficient documentation

## 2018-12-02 DIAGNOSIS — R059 Cough, unspecified: Secondary | ICD-10-CM

## 2018-12-02 DIAGNOSIS — R0602 Shortness of breath: Secondary | ICD-10-CM | POA: Diagnosis not present

## 2018-12-02 DIAGNOSIS — E119 Type 2 diabetes mellitus without complications: Secondary | ICD-10-CM | POA: Insufficient documentation

## 2018-12-02 DIAGNOSIS — Z853 Personal history of malignant neoplasm of breast: Secondary | ICD-10-CM | POA: Insufficient documentation

## 2018-12-02 DIAGNOSIS — R05 Cough: Secondary | ICD-10-CM | POA: Diagnosis not present

## 2018-12-02 DIAGNOSIS — Z79899 Other long term (current) drug therapy: Secondary | ICD-10-CM | POA: Diagnosis not present

## 2018-12-02 LAB — BASIC METABOLIC PANEL
Anion gap: 10 (ref 5–15)
BUN: 11 mg/dL (ref 6–20)
CO2: 24 mmol/L (ref 22–32)
Calcium: 9.1 mg/dL (ref 8.9–10.3)
Chloride: 102 mmol/L (ref 98–111)
Creatinine, Ser: 0.76 mg/dL (ref 0.44–1.00)
GFR calc Af Amer: >60 mL/min (ref >60)
GFR calc non Af Amer: >60 mL/min (ref >60)
GLUCOSE: 89 mg/dL (ref 70–99)
Potassium: 3.7 mmol/L (ref 3.5–5.1)
Sodium: 136 mmol/L (ref 135–145)

## 2018-12-02 LAB — CBC WITH DIFFERENTIAL/PLATELET
Abs Immature Granulocytes: 0.04 10*3/uL (ref 0.00–0.07)
Basophils Absolute: 0 10*3/uL (ref 0.0–0.1)
Basophils Relative: 0 %
Eosinophils Absolute: 0.1 10*3/uL (ref 0.0–0.5)
Eosinophils Relative: 1 %
HCT: 36 % (ref 36.0–46.0)
Hemoglobin: 11.4 g/dL — ABNORMAL LOW (ref 12.0–15.0)
Immature Granulocytes: 0 %
LYMPHS ABS: 3 10*3/uL (ref 0.7–4.0)
Lymphocytes Relative: 28 %
MCH: 31 pg (ref 26.0–34.0)
MCHC: 31.7 g/dL (ref 30.0–36.0)
MCV: 97.8 fL (ref 80.0–100.0)
MONOS PCT: 6 %
Monocytes Absolute: 0.7 10*3/uL (ref 0.1–1.0)
Neutro Abs: 6.7 10*3/uL (ref 1.7–7.7)
Neutrophils Relative %: 65 %
Platelets: 283 10*3/uL (ref 150–400)
RBC: 3.68 MIL/uL — ABNORMAL LOW (ref 3.87–5.11)
RDW: 14 % (ref 11.5–15.5)
WBC: 10.5 10*3/uL (ref 4.0–10.5)
nRBC: 0 % (ref 0.0–0.2)

## 2018-12-02 LAB — D-DIMER, QUANTITATIVE: D-Dimer, Quant: 0.28 ug/mL-FEU (ref 0.00–0.50)

## 2018-12-02 MED ORDER — ALBUTEROL SULFATE HFA 108 (90 BASE) MCG/ACT IN AERS
2.0000 | INHALATION_SPRAY | Freq: Once | RESPIRATORY_TRACT | Status: AC
  Administered 2018-12-02: 2 via RESPIRATORY_TRACT
  Filled 2018-12-02: qty 6.7

## 2018-12-02 MED ORDER — AEROCHAMBER PLUS FLO-VU MEDIUM MISC
1.0000 | Freq: Once | Status: AC
  Administered 2018-12-02: 1
  Filled 2018-12-02: qty 1

## 2018-12-02 MED ORDER — BENZONATATE 100 MG PO CAPS: 100.0000 mg | ORAL_CAPSULE | Freq: Three times a day (TID) | ORAL | 0 refills | Status: DC | PRN

## 2018-12-02 NOTE — ED Provider Notes (Signed)
[facility] DEPT Provider Note   CSN: [id number] Arrival date & time: 12/02/18  1450    History   Chief Complaint Chief Complaint  Patient presents with  . Excessive Sweating  . Cough    HPI Erin [name] is a 58 y.o. female.     The history is provided by the patient and medical records. A language interpreter was used (sign interpreter).  Cough   Erin [name] is a 58 y.o. female who presents to the Emergency Department complaining of cough, sweating. She presents to the emergency department for evaluation of progressive cough for the last three weeks. She has had sick contacts with a nephew that had upper respiratory symptoms. No known COBIT contacts. She was seen in the emergency department treated with Mucinex that only gave her partial improvement. She initially had fevers but these are now resolved, no fever for three weeks. She complains of progressive and persistent cough as well as sweating episodes. Sweating episodes have been ongoing for an unknown amount of time. She denies any hemoptysis, leg swelling or pain, chest pain. She does have a history of breast cancer, thought to be in remission as well as diabetes and hypertension. She does not take any hormones or anticoagulants. Symptoms are moderate and persistent. Past Medical History:  Diagnosis Date  . Anemia   . Arthritis    knees, elbows  . BRCA2 gene mutation positive 02/17/2018   BRCA2 c.4552del (p.Glu1518Asnfs*25)  Result reported out on 02/15/2018.   . Breast cancer ([place]) 05/2017   left  . Cough 06/03/2017  . Deaf   . Depression   . Dyspnea    states some SOB with ADLs and aanemia   . Family history of breast cancer   . Family history of breast cancer   . Family history of colon cancer   . Genetic testing 02/17/2018   The Common Hereditary Cancer Panel offered by [facility] includes sequencing and/or deletion duplication testing of the following 47 genes: APC, ATM, AXIN2, BARD1,  BMPR1A, BRCA1, BRCA2, BRIP1, CDH1, CDKN2A (p14ARF), CDKN2A (p16INK4a), CKD4, CHEK2, CTNNA1, DICER1, EPCAM (Deletion/duplication testing only), GREM1 (promoter region deletion/duplication testing only), KIT, MEN1, MLH1, MSH2, MSH3, MSH6, MU  . High cholesterol   . History of blood transfusion 07/2017   [facility]  . Hypertension   . Non-insulin dependent type 2 diabetes mellitus ([place])   . Runny nose 06/03/2017   clear drainage, per pt.  . SVD (spontaneous vaginal delivery)    x 2    Patient Active Problem List   Diagnosis Date Noted  . BRCA2 gene mutation positive 02/17/2018  . Genetic testing 02/17/2018  . Family history of breast cancer   . Family history of colon cancer   . 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 S/P vaginal hysterectomy 11/04/2017  . Ovarian mass, left 08/26/2017  . Menorrhagia 08/26/2017  . Port-A-Cath in place 07/23/2017  . Malignant neoplasm of lower-outer quadrant of left breast of female, estrogen receptor negative ([place]) 05/13/2017  . Abdominal bloating 04/02/2017  . Chronic right-sided low back pain without sciatica 07/02/2016  . DM type 2 (diabetes mellitus, type 2) ([place]) 11/18/2014  . Intermittent constipation 06/06/2010  . UNSPECIFIED IRON DEFICIENCY ANEMIA 04/22/2010  . DEPRESSION 04/10/2010  . DEAFNESS 04/10/2010    Past Surgical History:  Procedure Laterality Date  . BREAST LUMPECTOMY Left 06/10/2007  . BREAST LUMPECTOMY WITH RADIOACTIVE SEED AND SENTINEL LYMPH NODE BIOPSY Left 06/09/2017   Procedure: BREAST LUMPECTOMY WITH  RADIOACTIVE SEED AND SENTINEL LYMPH NODE BIOPSY;  Surgeon: Excell Seltzer, MD;  Location: [facility];  Service: General;  Laterality: Left;  . COLONOSCOPY  11/02/2017   polyps  . LAPAROSCOPIC ASSISTED VAGINAL HYSTERECTOMY N/A 11/04/2017   Procedure: LAPAROSCOPIC ASSISTED VAGINAL HYSTERECTOMY;  Surgeon: [name], MD;  Location: [facility];  Service: Gynecology;  Laterality: N/A;  .  LAPAROSCOPIC BILATERAL SALPINGO OOPHERECTOMY Bilateral 11/04/2017   Procedure: LAPAROSCOPIC BILATERAL SALPINGO OOPHORECTOMY;  Surgeon: [name], MD;  Location: [facility];  Service: Gynecology;  Laterality: Bilateral;  . MULTIPLE TOOTH EXTRACTIONS    . PORTACATH PLACEMENT Right 06/09/2017   Procedure: INSERTION PORT-A-CATH WITH [place];  Surgeon: Excell Seltzer, MD;  Location: [facility];  Service: General;  Laterality: Right;  . TUBAL LIGATION  02/04/2002  . UPPER GI ENDOSCOPY       OB History    Gravida  3   Para  2   Term      Preterm      AB  1   Living  2     SAB  1   TAB      Ectopic      Multiple      Live Births               Home Medications    Prior to Admission medications   Medication Sig Start Date End Date Taking? Authorizing Provider  ACCU-CHEK SOFTCLIX LANCETS lancets Monitor blood glucose levels by fingerstick once daily 05/30/18   [name], NP  atorvastatin (LIPITOR) 40 MG tablet Take 1 tablet (40 mg total) by mouth daily. 05/30/18   [name], NP  benzonatate (TESSALON) 100 MG capsule Take 1 capsule (100 mg total) by mouth 3 (three) times daily as needed for cough. 12/02/18   Quintella Reichert, MD  Blood Glucose Monitoring Suppl (ACCU-CHEK AVIVA PLUS) W/DEVICE KIT Check Blood sugar AM fasting and before dinner 12/17/14   Chari Manning A, NP  cholecalciferol (VITAMIN D) 1000 units tablet Take 1,000 Units by mouth daily.    [provider]  clindamycin (CLINDAGEL) 1 % gel Apply topically 2 (two) times daily. 06/28/18   [name], MD  diclofenac sodium (VOLTAREN) 1 % GEL Apply 2 g topically 2 (two) times daily. 06/07/18   [name], MD  ferrous sulfate 325 (65 FE) MG tablet Take 1 tablet (325 mg total) by mouth daily before breakfast. 05/03/18 08/01/18  [name], NP  glucose blood (ACCU-CHEK AVIVA) test strip Monitor blood glucose levels by fingerstick once daily 05/30/18   [name], NP  Lancet  Devices [facility]) lancets Use as instructed 12/17/14   Lance Bosch, NP  lisinopril (PRINIVIL,ZESTRIL) 10 MG tablet Take 1 tablet (10 mg total) by mouth daily. 05/30/18 08/28/18  [name], NP  LORazepam (ATIVAN) 0.5 MG tablet Take 1 tablet (0.5 mg total) by mouth every 8 (eight) hours. 11/24/18   [name], MD  metFORMIN (GLUCOPHAGE XR) 500 MG 24 hr tablet Take 2 tablets (1,000 mg total) by mouth daily with breakfast. 05/30/18   [name], NP  Omega-3 Fatty Acids (FISH OIL OMEGA-3 PO) Take 1 capsule by mouth daily.     [provider]  ondansetron (ZOFRAN) 8 MG tablet Take 1 tablet (8 mg total) by mouth every 8 (eight) hours as needed for nausea or vomiting. 11/23/18   [name], MD    Family History Family History  Problem Relation Age  of Onset  . Hypertension Mother   . Diabetes Father   . Hypertension Sister   . Diabetes Paternal Grandmother   . Colon cancer Paternal Grandmother 53  . CAD Brother   . Diabetes Brother   . Breast cancer Maternal Aunt        dx >50  . Heart attack Paternal Grandfather   . Breast cancer Maternal Aunt        dx under 73  . Breast cancer Maternal Aunt        dx  under 90    Social History Social History   Tobacco Use  . Smoking status: Never Smoker  . Smokeless tobacco: Never Used  Substance Use Topics  . Alcohol use: No  . Drug use: No     Allergies   Patient has no known allergies.   Review of Systems Review of Systems  Respiratory: Positive for cough.   All other systems reviewed and are negative.    Physical Exam Updated Vital Signs BP 119/73   Pulse 93   Temp 98.4 F (36.9 C) (Oral)   Resp (!) 31   LMP 07/09/2017 (LMP Unknown)   SpO2 98%   Physical Exam Vitals signs and nursing note reviewed.  Constitutional:      Appearance: She is well-developed.  HENT:     Head: Normocephalic and atraumatic.  Cardiovascular:     Rate and Rhythm: Normal rate and regular rhythm.     Heart  sounds: No murmur.  Pulmonary:     Effort: Pulmonary effort is normal. No respiratory distress.     Breath sounds: Normal breath sounds.     Comments: Wheezes bilaterally Abdominal:     Palpations: Abdomen is soft.     Tenderness: There is no abdominal tenderness. There is no guarding or rebound.  Musculoskeletal:        General: No swelling or tenderness.  Skin:    General: Skin is warm and dry.  Neurological:     Mental Status: She is alert and oriented to person, place, and time.  Psychiatric:        Behavior: Behavior normal.      ED Treatments / Results  Labs (all labs ordered are listed, but only abnormal results are displayed) Labs Reviewed  CBC WITH DIFFERENTIAL/PLATELET - Abnormal; Notable for the following components:      Result Value   RBC 3.68 (*)    Hemoglobin 11.4 (*)    All other components within normal limits  BASIC METABOLIC PANEL  D-DIMER, QUANTITATIVE (NOT AT [facility])    EKG EKG Interpretation  Date/Time:  Friday December 02 2018 18:00:10 EDT Ventricular Rate:  78 PR Interval:    QRS Duration: 84 QT Interval:  386 QTC Calculation: 440 R Axis:   30 Text Interpretation:  Sinus rhythm Borderline T wave abnormalities No significant change since last tracing Confirmed by Quintella Reichert [phone]) on 12/02/2018 6:11:49 PM   Radiology Dg Chest 2 View  Result Date: 12/02/2018 CLINICAL DATA:  Shortness of breath EXAM: CHEST - 2 VIEW COMPARISON:  11/14/2018 FINDINGS: The heart size and mediastinal contours are within normal limits. Both lungs are clear. Mild degenerative osteophytes of the spine. IMPRESSION: No active cardiopulmonary disease. Electronically Signed   By: Donavan Foil M.D.   On: 12/02/2018 17:24    Procedures Procedures (including critical care time)  Medications Ordered in ED Medications  AeroChamber Plus Flo-Vu Medium MISC 1 each (has no administration in time range)  albuterol (PROVENTIL HFA;VENTOLIN  HFA) 108 (90 Base) MCG/ACT inhaler 2  puff (2 puffs Inhalation Given 12/02/18 1744)     Initial Impression / Assessment and Plan / ED Course  I have reviewed the triage vital signs and the nursing notes.  Pertinent labs & imaging results that were available during my care of the patient were reviewed by me and considered in my medical decision making (see chart for details).        Patient presents to the emergency department for evaluation of cough for the last three weeks. She is non-toxic appearing on evaluation and in no acute distress. She does have some mild wheezing on examination that did improve after albuterol MDI. Current presentation is not consistent with CHF, ACS, pneumonia. Doubt PE she is low risk by Wells criteria and D dimer is negative. Reviewed today's x-ray as well as x-ray from prior visit on March 2. Counseled patient on home care for upper respiratory infection. She does not have any COVID risk factors at this time. Discussed outpatient follow-up as well as return precautions.  Final Clinical Impressions(s) / ED Diagnoses   Final diagnoses:  Cough    ED Discharge Orders         Ordered    benzonatate (TESSALON) 100 MG capsule  3 times daily PRN     12/02/18 Garey Ham, MD 12/02/18 1935

## 2018-12-02 NOTE — ED Triage Notes (Signed)
Pt is deaf and have to use ASL on [place]. Pt been having lots of sweating at home and doesn't have air conditioning and cough for couple weeks.  The cough has gotten better in the past couple days after taking the medications I got for it. Also reports little bit of runny nose.  Reports was around her grandson who was sick. Pt c/o chest pain with cough and breathing in.  Pt taking a cough syrup but cant remember what it is.

## 2018-12-02 NOTE — Discharge Instructions (Signed)
You can continue to use the albuterol inhaler 1-2 puffs every 4-6 hours as needed for wheezing/cough.

## 2018-12-15 ENCOUNTER — Telehealth: Payer: Self-pay | Admitting: *Deleted

## 2018-12-15 NOTE — Telephone Encounter (Signed)
Called to speak to the patient regarding her app for 4/22, spoke with the patient's daughter. She will have the patient call the office with the daughter once she is home.

## 2018-12-16 ENCOUNTER — Telehealth: Payer: Self-pay | Admitting: *Deleted

## 2018-12-16 NOTE — Telephone Encounter (Signed)
Called and left a message for the patient to call the office back. Need to move her appt on 4/22.

## 2018-12-22 ENCOUNTER — Telehealth: Payer: Self-pay | Admitting: *Deleted

## 2018-12-22 NOTE — Telephone Encounter (Signed)
Called and the patient's daughter answered the phone. She will have the patient call the office back

## 2018-12-26 ENCOUNTER — Other Ambulatory Visit: Payer: Self-pay

## 2018-12-26 ENCOUNTER — Encounter: Payer: Self-pay | Admitting: Nurse Practitioner

## 2018-12-26 ENCOUNTER — Ambulatory Visit (INDEPENDENT_AMBULATORY_CARE_PROVIDER_SITE_OTHER): Payer: Medicaid Other | Admitting: Nurse Practitioner

## 2018-12-26 ENCOUNTER — Telehealth: Payer: Self-pay | Admitting: *Deleted

## 2018-12-26 ENCOUNTER — Ambulatory Visit: Payer: Medicaid Other | Admitting: Family Medicine

## 2018-12-26 DIAGNOSIS — E785 Hyperlipidemia, unspecified: Secondary | ICD-10-CM

## 2018-12-26 DIAGNOSIS — E1165 Type 2 diabetes mellitus with hyperglycemia: Secondary | ICD-10-CM

## 2018-12-26 DIAGNOSIS — E118 Type 2 diabetes mellitus with unspecified complications: Principal | ICD-10-CM

## 2018-12-26 DIAGNOSIS — D509 Iron deficiency anemia, unspecified: Secondary | ICD-10-CM | POA: Diagnosis not present

## 2018-12-26 DIAGNOSIS — H913 Deaf nonspeaking, not elsewhere classified: Secondary | ICD-10-CM

## 2018-12-26 DIAGNOSIS — IMO0002 Reserved for concepts with insufficient information to code with codable children: Secondary | ICD-10-CM

## 2018-12-26 DIAGNOSIS — E1169 Type 2 diabetes mellitus with other specified complication: Secondary | ICD-10-CM | POA: Diagnosis not present

## 2018-12-26 MED ORDER — GLUCOSE BLOOD VI STRP: ORAL_STRIP | 12 refills | Status: DC

## 2018-12-26 MED ORDER — ATORVASTATIN CALCIUM 40 MG PO TABS: 40.0000 mg | ORAL_TABLET | Freq: Every day | ORAL | 1 refills | Status: DC

## 2018-12-26 MED ORDER — ACCU-CHEK GUIDE ME W/DEVICE KIT: 1.0000 | PACK | Freq: Once | 0 refills | Status: AC

## 2018-12-26 MED ORDER — ACCU-CHEK FASTCLIX LANCET KIT: 1.0000 | PACK | Freq: Once | 0 refills | Status: AC

## 2018-12-26 MED ORDER — ACCU-CHEK GUIDE CONTROL VI LIQD: 1.0000 | Freq: Once | 0 refills | Status: AC | PRN

## 2018-12-26 MED ORDER — FERROUS SULFATE 325 (65 FE) MG PO TABS: 325.0000 mg | ORAL_TABLET | Freq: Every day | ORAL | 1 refills | Status: DC

## 2018-12-26 MED ORDER — ACCU-CHEK FASTCLIX LANCETS MISC: 3 refills | Status: DC

## 2018-12-26 NOTE — Telephone Encounter (Signed)
Patient called back and she moved her appts to June via the interpreter line

## 2018-12-26 NOTE — Telephone Encounter (Signed)
Called and left the patient a message to call the office back via interpreter. Need to move the patient's appt to June per Dr. Denman George.

## 2018-12-26 NOTE — Progress Notes (Signed)
Assessment & Plan:  [name] was seen today for diabetes.  Diagnoses and all orders for this visit:  Diabetes mellitus type 2, uncontrolled, with complications ([place]) -     Ambulatory referral to Ophthalmology -     Accu-Chek FastClix Lancets MISC; Use as instructed. Inject into the skin twice daily -     Lancets Misc. (ACCU-CHEK FASTCLIX LANCET) KIT; 1 kit by Does not apply route once for 1 dose. -     Blood Glucose Monitoring Suppl (ACCU-CHEK GUIDE ME) w/Device KIT; 1 each by Does not apply route once for 1 dose. -     Blood Glucose Calibration (ACCU-CHEK GUIDE CONTROL) LIQD; 1 each by In Vitro route once as needed for up to 1 dose. -     glucose blood (ACCU-CHEK GUIDE) test strip; Use as instructed. Check blood glucose by fingerstick twice per day.  Iron deficiency anemia, unspecified iron deficiency anemia type -     ferrous sulfate 325 (65 FE) MG tablet; Take 1 tablet (325 mg total) by mouth daily before breakfast. -     Lancets Misc. (ACCU-CHEK FASTCLIX LANCET) KIT; 1 kit by Does not apply route once for 1 dose.    Patient has been counseled on age-appropriate routine health concerns for screening and prevention. These are reviewed and up-to-date. Referrals have been placed accordingly. Immunizations are up-to-date or declined.    Subjective:   Chief Complaint  Patient presents with  . Diabetes   HPI Erin [name] 58 y.o. female presents for telehealth visit over the phone. She is deaf and therefore a sign language interpreter was used for patient's video conferencing and my phone assessment.   DM TYPE 2 Chronic. She endorses medication compliance taking metformin 1000 mg daily. Unfortunately she has not been monitoring her blood glucose levels over the past few months. States she can not find her meter nor does she have any supplies. I will order all today. She denies any hypo or hyperglycemic symptoms. Not consistently compliant with diet. Unable to recall any readings prior  to losing her meter or running out of strips. Overdue for eye exam. Will place referral today. She is on an ACE and statin.  Lab Results  Component Value Date   HGBA1C 6.6 (A) 05/30/2018    Review of Systems  Constitutional: Negative for fever, malaise/fatigue and weight loss.  HENT: Negative.  Negative for nosebleeds.   Eyes: Negative.  Negative for blurred vision, double vision and photophobia.  Respiratory: Negative.  Negative for cough and shortness of breath.   Cardiovascular: Negative.  Negative for chest pain, palpitations and leg swelling.  Gastrointestinal: Negative.  Negative for heartburn, nausea and vomiting.  Musculoskeletal: Negative.  Negative for myalgias.  Neurological: Negative.  Negative for dizziness, focal weakness, seizures and headaches.  Psychiatric/Behavioral: Negative.  Negative for suicidal ideas.    Past Medical History:  Diagnosis Date  . Anemia   . Arthritis    knees, elbows  . BRCA2 gene mutation positive 02/17/2018   BRCA2 c.4552del (p.Glu1518Asnfs*25)  Result reported out on 02/15/2018.   . Breast cancer ([place]) 05/2017   left  . Cough 06/03/2017  . Deaf   . Depression   . Dyspnea    states some SOB with ADLs and aanemia   . Family history of breast cancer   . Family history of breast cancer   . Family history of colon cancer   . Genetic testing 02/17/2018   The Common Hereditary Cancer Panel offered by [facility] includes  sequencing and/or deletion duplication testing of the following 47 genes: APC, ATM, AXIN2, BARD1, BMPR1A, BRCA1, BRCA2, BRIP1, CDH1, CDKN2A (p14ARF), CDKN2A (p16INK4a), CKD4, CHEK2, CTNNA1, DICER1, EPCAM (Deletion/duplication testing only), GREM1 (promoter region deletion/duplication testing only), KIT, MEN1, MLH1, MSH2, MSH3, MSH6, MU  . High cholesterol   . History of blood transfusion 07/2017   [facility]  . Hypertension   . Non-insulin dependent type 2 diabetes mellitus ([place])   . Runny nose 06/03/2017   clear drainage, per pt.  . SVD  (spontaneous vaginal delivery)    x 2    Past Surgical History:  Procedure Laterality Date  . BREAST LUMPECTOMY Left 06/10/2007  . BREAST LUMPECTOMY WITH RADIOACTIVE SEED AND SENTINEL LYMPH NODE BIOPSY Left 06/09/2017   Procedure: BREAST LUMPECTOMY WITH RADIOACTIVE SEED AND SENTINEL LYMPH NODE BIOPSY;  Surgeon: Excell Seltzer, MD;  Location: [facility];  Service: General;  Laterality: Left;  . COLONOSCOPY  11/02/2017   polyps  . LAPAROSCOPIC ASSISTED VAGINAL HYSTERECTOMY N/A 11/04/2017   Procedure: LAPAROSCOPIC ASSISTED VAGINAL HYSTERECTOMY;  Surgeon: [name], MD;  Location: [facility];  Service: Gynecology;  Laterality: N/A;  . LAPAROSCOPIC BILATERAL SALPINGO OOPHERECTOMY Bilateral 11/04/2017   Procedure: LAPAROSCOPIC BILATERAL SALPINGO OOPHORECTOMY;  Surgeon: [name], MD;  Location: [facility];  Service: Gynecology;  Laterality: Bilateral;  . MULTIPLE TOOTH EXTRACTIONS    . PORTACATH PLACEMENT Right 06/09/2017   Procedure: INSERTION PORT-A-CATH WITH [place];  Surgeon: Excell Seltzer, MD;  Location: [facility];  Service: General;  Laterality: Right;  . TUBAL LIGATION  02/04/2002  . UPPER GI ENDOSCOPY      Family History  Problem Relation Age of Onset  . Hypertension Mother   . Diabetes Father   . Hypertension Sister   . Diabetes Paternal Grandmother   . Colon cancer Paternal Grandmother 52  . CAD Brother   . Diabetes Brother   . Breast cancer Maternal Aunt        dx >50  . Heart attack Paternal Grandfather   . Breast cancer Maternal Aunt        dx under 63  . Breast cancer Maternal Aunt        dx  under 79    Social History Reviewed with no changes to be made today.   Outpatient Medications Prior to Visit  Medication Sig Dispense Refill  . atorvastatin (LIPITOR) 40 MG tablet Take 1 tablet (40 mg total) by mouth daily. 90 tablet 1  . cholecalciferol (VITAMIN D) 1000 units tablet Take 1,000 Units by mouth daily.    . clindamycin  (CLINDAGEL) 1 % gel Apply topically 2 (two) times daily. 30 g 1  . Lancet Devices (ACCU-CHEK SOFTCLIX) lancets Use as instructed 1 each 0  . lisinopril (PRINIVIL,ZESTRIL) 10 MG tablet Take 10 mg by mouth daily.    . metFORMIN (GLUCOPHAGE XR) 500 MG 24 hr tablet Take 2 tablets (1,000 mg total) by mouth daily with breakfast. 180 tablet 1  . ACCU-CHEK SOFTCLIX LANCETS lancets Monitor blood glucose levels by fingerstick once daily 100 each 12  . Blood Glucose Monitoring Suppl (ACCU-CHEK AVIVA PLUS) W/DEVICE KIT Check Blood sugar AM fasting and before dinner 1 kit 0  . glucose blood (ACCU-CHEK AVIVA) test strip Monitor blood glucose levels by fingerstick once daily 100 each 12  . benzonatate (TESSALON) 100 MG capsule Take 1 capsule (100 mg total) by mouth 3 (three) times daily as needed for cough. 21 capsule 0  . diclofenac sodium (VOLTAREN) 1 %  GEL Apply 2 g topically 2 (two) times daily. 1 Tube 0  . ferrous sulfate 325 (65 FE) MG tablet Take 1 tablet (325 mg total) by mouth daily before breakfast. 90 tablet 1  . lisinopril (PRINIVIL,ZESTRIL) 10 MG tablet Take 1 tablet (10 mg total) by mouth daily. 90 tablet 1  . LORazepam (ATIVAN) 0.5 MG tablet Take 1 tablet (0.5 mg total) by mouth every 8 (eight) hours. 4 tablet 0  . Omega-3 Fatty Acids (FISH OIL OMEGA-3 PO) Take 1 capsule by mouth daily.     . ondansetron (ZOFRAN) 8 MG tablet Take 1 tablet (8 mg total) by mouth every 8 (eight) hours as needed for nausea or vomiting. 20 tablet 0   Facility-Administered Medications Prior to Visit  Medication Dose Route Frequency Provider Last Rate Last Dose  . 0.9 %  sodium chloride infusion  500 mL Intravenous Once Nandigam, Venia Minks, MD        No Known Allergies     Objective:    LMP 07/09/2017 (LMP Unknown)  Wt Readings from Last 3 Encounters:  10/14/18 156 lb (70.8 kg)  10/04/18 161 lb 8 oz (73.3 kg)  06/28/18 159 lb 3.2 oz (72.2 kg)         Patient has been counseled extensively about nutrition  and exercise as well as the importance of adherence with medications and regular follow-up. The patient was given clear instructions to go to ER or return to medical center if symptoms don't improve, worsen or new problems develop. The patient verbalized understanding.   Follow-up: Return in about 6 weeks (around 02/06/2019) for DM.   [name], FNP-BC [facility] and [place], [place]   12/26/2018, 3:04 PM

## 2018-12-26 NOTE — Progress Notes (Signed)
Worked patient up for her televisit. She states that she has been out of her test strips for about 2 months so she hasn't been checking FSBS.

## 2019-01-02 NOTE — Progress Notes (Signed)
HEMATOLOGY-ONCOLOGY TELEPHONE VISIT PROGRESS NOTE  I connected with Erin [name] on 01/03/2019 at  9:30 AM EDT by telephone conference and verified that I am speaking with the correct person using two identifiers.  I discussed the limitations, risks, security and privacy concerns of performing an evaluation and management service by Webex and the availability of in person appointments.  I also discussed with the patient that there may be a patient responsible charge related to this service. The patient expressed understanding and agreed to proceed.  Patient's Location: Home Physician Location: Clinic  CHIEF COMPLIANT: Surveillance of breast and fallopian tube cancer  INTERVAL HISTORY: Erin [name] is a 58 y.o. female with above-mentioned history of breast cancer and fallopian tube cancer treated with lumpectomy, adjuvant chemotherapy, and radiation. A breast MRI on 11/08/18 showed two masses in the right breast, that when biopsied on 11/28/18 were shown to be benign breast parenchyma and fat necrosis. She presents today over Webex for annual follow-up.     Malignant neoplasm of lower-outer quadrant of left breast of female, estrogen receptor negative ([place])   05/11/2017 Initial Diagnosis    Left breast biopsy 3:30 position 6 cm from nipple: IDC with DCIS, lymphovascular invasion present, grade 2-3, ER 0%, PR 0%, HER-2 positive ratio 2.28, Ki-67 20%, 1 cm lesion left breast, T1b N0 stage IA clinical stage    06/09/2017 Surgery    Left lumpectomy: IDC grade 3, 1.2 cm, DCIS is present, margins negative, 0/6 lymph nodes negative, ER 0%, PR 0%, HER-2 positive ratio 2.28, Ki-67 20%, T1 BN 0 stage IA    07/09/2017 - 09/29/2017 Adjuvant Chemotherapy    Taxol/Herceptin x 12 completed on 09/29/2017, went on to receive every 3 week adjuvant Herceptin through 06/28/2018    11/25/2017 - 01/07/2018 Radiation Therapy     The patient initially received a dose of 50.4 Gy in 28 fractions to the breast using  whole-breast tangent fields. This was delivered using a 3-D conformal technique. The patient then received a boost to the seroma. This delivered an additional 10 Gy in 5 fractions using a 3-D technique. The total dose was 60.4 Gy.    02/15/2018 Genetic Testing    The Common Hereditary Cancer Panel offered by [facility] includes sequencing and/or deletion duplication testing of the following 47 genes: APC, ATM, AXIN2, BARD1, BMPR1A, BRCA1, BRCA2, BRIP1, CDH1, CDKN2A (p14ARF), CDKN2A (p16INK4a), CKD4, CHEK2, CTNNA1, DICER1, EPCAM (Deletion/duplication testing only), GREM1 (promoter region deletion/duplication testing only), KIT, MEN1, MLH1, MSH2, MSH3, MSH6, MUTYH, NBN, NF1, NHTL1, PALB2, PDGFRA, PMS2, POLD1, POLE, PTEN, RAD50, RAD51C, RAD51D, SDHB, SDHC, SDHD, SMAD4, SMARCA4. STK11, TP53, TSC1, TSC2, and VHL.  The following genes were evaluated for sequence changes only: SDHA and HOXB13 c.251G>A variant only.   Results: POSITIVE. 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 The date of this test report is 02/15/2018     Fallopian tube cancer, carcinoma, right ([place])   11/04/2017 Surgery    Uterus, cervix and bilateral fallopian tubes: Fallopian tube with serous carcinoma 0.6 cm positive for p53, PAX8, WT-1, MOC-31, cytokeratin 7, and estrogen receptor    01/11/2018 -  Chemotherapy    Taxol and carboplatin every 3 weeks (Taxol discontinued with cycle 2 because of profound rash)      REVIEW OF SYSTEMS:   Constitutional: Denies fevers, chills or abnormal weight loss Eyes: Denies blurriness of vision Ears, nose, mouth, throat, and face: Denies mucositis or sore throat Respiratory: Denies cough, dyspnea or wheezes Cardiovascular: Denies palpitation, chest discomfort Gastrointestinal:  Denies nausea, heartburn or change in bowel habits Skin: Denies abnormal skin rashes Lymphatics: Denies new lymphadenopathy or easy bruising Neurological:Denies numbness, tingling or new weaknesses  Behavioral/Psych: Mood is stable, no new changes  Extremities: No lower extremity edema Breast: denies any pain or lumps or nodules in either breasts All other systems were reviewed with the patient and are negative.  Observations/Objective:  There were no vitals filed for this visit. There is no height or weight on file to calculate BMI.  I have reviewed the data as listed CMP Latest Ref Rng & Units 12/02/2018 10/04/2018 06/28/2018  Glucose 70 - 99 mg/dL 89 132(H) 90  BUN 6 - 20 mg/dL _0 Creatinine 0.44 - 1.00 mg/dL 0.76 0.72 0.63  Sodium 135 - 145 mmol/L 136 139 141  Potassium 3.5 - 5.1 mmol/L 3.7 3.8 4.0  Chloride 98 - 111 mmol/L 102 104 106  CO2 22 - 32 mmol/L _1 Calcium 8.9 - 10.3 mg/dL 9.1 9.3 9.6  Total Protein 6.5 - 8.1 g/dL - 7.7 7.9  Total Bilirubin 0.3 - 1.2 mg/dL - 0.4 0.5  Alkaline Phos 38 - 126 U/L - 95 83  AST 15 - 41 U/L - 17 19  ALT 0 - 44 U/L - 33 23    Lab Results  Component Value Date   WBC 10.5 12/02/2018   HGB 11.4 (L) 12/02/2018   HCT 36.0 12/02/2018   MCV 97.8 12/02/2018   PLT 283 12/02/2018   NEUTROABS 6.7 12/02/2018      Assessment Plan:  Malignant neoplasm of lower-outer quadrant of left breast of female, estrogen receptor negative (HCC) 05/20/2017:Left lumpectomy: IDC grade 3, 1.2 cm, DCIS is present, margins negative, 0/6 lymph nodes negative, ER 0%, PR 0%, HER-2 positive ratio 2.28, Ki-67 20%, T1 BN 0 stage IA Taxol/Herceptin completed on 09/29/2017, went on to receive every 3 week adjuvant Herceptin on 10/20/2017 completed 06/28/2018  BRCA2 mutation: Patient decided not to undergo bilateral mastectomies. She wishes to follow annually with mammograms and breast MRIs  Breast MRI 11/07/2018: 2 irregular masses medial portion of the right breast 5 mm and 6 mm 11/28/2018: MRI guided biopsy: Benign 04/25/2018: Mammogram: No evidence of malignancy, breast density category B  Fallopian tube cancer, carcinoma, right ([place]) Hysterectomy with  fallopian tube surgery: Fallopian tube with serous carcinoma 0.6 cm positive for p53, PAX8, WT-1, MOC-31, cytokeratin 7, and estrogen receptor PriorTreatment: Taxol and carboplatin added to Herceptin treatment every 3 weeks.completed 3cycles(Taxol discontinued after cycle 1)  RTC in 1 year  I discussed the assessment and treatment plan with the patient. The patient was provided an opportunity to ask questions and all were answered. The patient agreed with the plan and demonstrated an understanding of the instructions. The patient was advised to call back or seek an in-person evaluation if the symptoms worsen or if the condition fails to improve as anticipated.   I provided 15 minutes of Non- face-to-face telephone time during this encounter.    Rulon Eisenmenger, MD 01/03/2019    I, Molly Dorshimer, am acting as scribe for [name], MD.  I have reviewed the above documentation for accuracy and completeness, and I agree with the above.

## 2019-01-03 ENCOUNTER — Inpatient Hospital Stay: Payer: Medicaid Other | Attending: Hematology and Oncology | Admitting: Hematology and Oncology

## 2019-01-03 ENCOUNTER — Other Ambulatory Visit: Payer: Medicaid Other

## 2019-01-03 DIAGNOSIS — Z1231 Encounter for screening mammogram for malignant neoplasm of breast: Secondary | ICD-10-CM | POA: Diagnosis not present

## 2019-01-03 DIAGNOSIS — C50512 Malignant neoplasm of lower-outer quadrant of left female breast: Secondary | ICD-10-CM | POA: Diagnosis not present

## 2019-01-03 DIAGNOSIS — C5701 Malignant neoplasm of right fallopian tube: Secondary | ICD-10-CM

## 2019-01-03 DIAGNOSIS — Z171 Estrogen receptor negative status [ER-]: Secondary | ICD-10-CM

## 2019-01-03 NOTE — Assessment & Plan Note (Addendum)
05/20/2017:Left lumpectomy: IDC grade 3, 1.2 cm, DCIS is present, margins negative, 0/6 lymph nodes negative, ER 0%, PR 0%, HER-2 positive ratio 2.28, Ki-67 20%, T1 BN 0 stage IA Taxol/Herceptin completed on 09/29/2017, went on to receive every 3 week adjuvant Herceptin on 10/20/2017 completed 06/28/2018  BRCA2 mutation: Patient decided not to undergo bilateral mastectomies. She wishes to follow annually with mammograms and breast MRIs  Breast MRI 11/07/2018: 2 irregular masses medial portion of the right breast 5 mm and 6 mm 11/28/2018: MRI guided biopsy: Benign 04/25/2018: Mammogram: No evidence of malignancy, breast density category B

## 2019-01-03 NOTE — Assessment & Plan Note (Signed)
Hysterectomy with fallopian tube surgery: Fallopian tube with serous carcinoma 0.6 cm positive for p53, PAX8, WT-1, MOC-31, cytokeratin 7, and estrogen receptor PriorTreatment: Taxol and carboplatin added to Herceptin treatment every 3 weeks.completed 3cycles(Taxol discontinued after cycle 1)

## 2019-01-04 ENCOUNTER — Ambulatory Visit: Payer: Medicaid Other | Admitting: Gynecologic Oncology

## 2019-01-27 ENCOUNTER — Telehealth: Payer: Self-pay | Admitting: *Deleted

## 2019-01-27 ENCOUNTER — Other Ambulatory Visit: Payer: Self-pay | Admitting: Family Medicine

## 2019-01-27 DIAGNOSIS — E118 Type 2 diabetes mellitus with unspecified complications: Secondary | ICD-10-CM

## 2019-01-27 NOTE — Telephone Encounter (Signed)
Patient called stating she checked her blood sugar yesterday and it was 153 and her blood sugar this morning was 169. Per patient she thinks this is elevated for her and she would like to know what to do. Per pt when she check her numbers, it's before she eats in the morning. Staff informed patient that patient have a televisit appt with provider 02/01/19 to discuss her DM. Patient verbalized understanding. Staff informed patient message will be sent to provider to see if she should do anything different before her appt.

## 2019-01-27 NOTE — Telephone Encounter (Signed)
Patient has not had a Hgb A1c since September of last year. Can you come to have a HgbA1c test next week to better determine what changes she may need to her diabetes medications. Please also find out if she has had any recent steroid therapy or a recent infection that may be increasing her blood sugars. I will open order note for HgbA1c and probably a BMP as well

## 2019-01-27 NOTE — Telephone Encounter (Signed)
Called patient to inform her with what provider stated. When staff introduced herself to patient, patient seems a bit confused. Patient asked staff if this office called her and staff stated yes and patient stated she thought she called public health department and they may have transferred the call to our office. Patient then stated can she give staff a call back and right when staff said ok, patient hung up.

## 2019-01-27 NOTE — Progress Notes (Signed)
Patient ID: Erin [name], female   DOB: 05/30/1961, 58 y.o.   MRN: [id number]   Phone call received that patient with recent increase in her blood sugars which are in the 150'-170's which is high for her. On review of chart, her last Hgb A1c was done 05/30/18 and was 6.6. She has had recent BMP per the cancer center with normal glucose (89) and normal creatinine (0.76) on 12/02/18. She will be contacted to come into the office for Hgb A1c and to find out if she has been on recent steroids or has a recent infection which might be increasing her blood sugars.

## 2019-01-31 NOTE — Telephone Encounter (Signed)
Patient have an appt on 02-01-19 to see provider in person

## 2019-02-01 ENCOUNTER — Ambulatory Visit: Payer: Medicaid Other | Admitting: Family Medicine

## 2019-02-02 ENCOUNTER — Encounter: Payer: Self-pay | Admitting: Family Medicine

## 2019-02-02 ENCOUNTER — Ambulatory Visit: Payer: Medicare Other | Attending: Family Medicine | Admitting: Family Medicine

## 2019-02-02 ENCOUNTER — Other Ambulatory Visit: Payer: Self-pay

## 2019-02-02 VITALS — BP 126/79 | HR 93 | Temp 98.1°F | Ht 61.0 in | Wt 163.0 lb

## 2019-02-02 DIAGNOSIS — E1165 Type 2 diabetes mellitus with hyperglycemia: Secondary | ICD-10-CM

## 2019-02-02 DIAGNOSIS — E119 Type 2 diabetes mellitus without complications: Secondary | ICD-10-CM | POA: Diagnosis not present

## 2019-02-02 DIAGNOSIS — E785 Hyperlipidemia, unspecified: Secondary | ICD-10-CM

## 2019-02-02 DIAGNOSIS — E1169 Type 2 diabetes mellitus with other specified complication: Secondary | ICD-10-CM | POA: Diagnosis not present

## 2019-02-02 DIAGNOSIS — E118 Type 2 diabetes mellitus with unspecified complications: Secondary | ICD-10-CM | POA: Diagnosis not present

## 2019-02-02 DIAGNOSIS — R202 Paresthesia of skin: Secondary | ICD-10-CM | POA: Diagnosis not present

## 2019-02-02 DIAGNOSIS — IMO0002 Reserved for concepts with insufficient information to code with codable children: Secondary | ICD-10-CM

## 2019-02-02 DIAGNOSIS — I10 Essential (primary) hypertension: Secondary | ICD-10-CM

## 2019-02-02 LAB — GLUCOSE, POCT (MANUAL RESULT ENTRY): POC Glucose: 156 mg/dL — AB (ref 70–99)

## 2019-02-02 MED ORDER — LISINOPRIL 10 MG PO TABS: 10.0000 mg | ORAL_TABLET | Freq: Every day | ORAL | 3 refills | Status: DC

## 2019-02-02 MED ORDER — GLIMEPIRIDE 2 MG PO TABS: 2.0000 mg | ORAL_TABLET | Freq: Every day | ORAL | 5 refills | Status: DC

## 2019-02-02 NOTE — Progress Notes (Signed)
Per pt her meter is not correct. Per pt she just want to compare her meter with our CBG meter. It keeps giving her high numbers.   Med refill  Per pt the bottom of her left foot is feeling different. Per pt the feet feel raw but she's not sure   CBG for today is  156

## 2019-02-02 NOTE — Progress Notes (Signed)
Established Patient Office Visit  Subjective:  Patient ID: Erin [name], female    DOB: 07-19-1961  Age: 58 y.o. MRN: [id number]   Due to a language/communication barrier, patient is accompanied by a sign language interpreter at today's visit  CC:  Chief Complaint  Patient presents with  . Diabetes  . Medication Refill    HPI PAIJE GOODHART presents due to complaint of her blood sugars being elevated over the past 4 weeks as well as 4 or more weeks of abnormal sensation to the bottom of the left foot.  Patient states that the sensation is only present when she does not have her shoes on.  When she is in her bare feet, she has a sensation that there is something under the bottom of the ball of her left foot beneath the toes.  She does not really feel any painful sensation.  Area on the bottom of her foot sometimes feels numb.  No tingling.  She is not having any symptoms in the right foot.  Patient has a history of breast cancer and has undergone chemotherapy and radiation with last radiation treatment for breast cancer in January 2019.  Patient was discovered to have right fallopian tube cancer for which she underwent surgery on 11/04/2017 further removal of the uterus, cervix and bilateral fallopian tubes and patient had chemotherapy in April of last year with Taxol and carboplatin but Taxol had to be discontinued during the second cycle because of a profound rash per her Oncology notes.       Patient states that previously her fasting blood sugars are generally in the 120s or less.  Patient has her glucometer with her and she has had readings as high as in the 220s fasting on occasion but generally around 160s over the past 4 weeks.  She denies any changes in her diet or level of exercise/activity.  She has taken her medication daily.  She is not sure why her blood sugars are slightly higher than they have been in the past.  She has had some occasional urinary frequency when her blood sugars  are high.  She denies any dysuria.  She also has some increased thirst when her blood sugars are elevated.  She is currently on metformin for her diabetes.  She denies any GI issues, no abdominal pain, nausea/vomiting/diarrhea with the use of metformin.  She continues to take her blood pressure medication, lisinopril and has had no issues with headaches or dizziness related to her blood pressure.  She has had no cough associated with the use of lisinopril.  She also continues to take her Lipitor and she denies any increased muscle or joint pain with the use of this medication.  She is fasting at today's visit as she has not yet eaten.  Past Medical History:  Diagnosis Date  . Anemia   . Arthritis    knees, elbows  . BRCA2 gene mutation positive 02/17/2018   BRCA2 c.4552del (p.Glu1518Asnfs*25)  Result reported out on 02/15/2018.   . Breast cancer ([place]) 05/2017   left  . Cough 06/03/2017  . Deaf   . Depression   . Dyspnea    states some SOB with ADLs and aanemia   . Family history of breast cancer   . Family history of breast cancer   . Family history of colon cancer   . Genetic testing 02/17/2018   The Common Hereditary Cancer Panel offered by [facility] includes sequencing and/or deletion duplication testing of the following  47 genes: APC, ATM, AXIN2, BARD1, BMPR1A, BRCA1, BRCA2, BRIP1, CDH1, CDKN2A (p14ARF), CDKN2A (p16INK4a), CKD4, CHEK2, CTNNA1, DICER1, EPCAM (Deletion/duplication testing only), GREM1 (promoter region deletion/duplication testing only), KIT, MEN1, MLH1, MSH2, MSH3, MSH6, MU  . High cholesterol   . History of blood transfusion 07/2017   [facility]  . Hypertension   . Non-insulin dependent type 2 diabetes mellitus ([place])   . Runny nose 06/03/2017   clear drainage, per pt.  . SVD (spontaneous vaginal delivery)    x 2    Past Surgical History:  Procedure Laterality Date  . BREAST LUMPECTOMY Left 06/10/2007  . BREAST LUMPECTOMY WITH RADIOACTIVE SEED AND SENTINEL LYMPH NODE BIOPSY  Left 06/09/2017   Procedure: BREAST LUMPECTOMY WITH RADIOACTIVE SEED AND SENTINEL LYMPH NODE BIOPSY;  Surgeon: Excell Seltzer, MD;  Location: [facility];  Service: General;  Laterality: Left;  . COLONOSCOPY  11/02/2017   polyps  . LAPAROSCOPIC ASSISTED VAGINAL HYSTERECTOMY N/A 11/04/2017   Procedure: LAPAROSCOPIC ASSISTED VAGINAL HYSTERECTOMY;  Surgeon: [name], MD;  Location: [facility];  Service: Gynecology;  Laterality: N/A;  . LAPAROSCOPIC BILATERAL SALPINGO OOPHERECTOMY Bilateral 11/04/2017   Procedure: LAPAROSCOPIC BILATERAL SALPINGO OOPHORECTOMY;  Surgeon: [name], MD;  Location: [facility];  Service: Gynecology;  Laterality: Bilateral;  . MULTIPLE TOOTH EXTRACTIONS    . PORTACATH PLACEMENT Right 06/09/2017   Procedure: INSERTION PORT-A-CATH WITH [place];  Surgeon: Excell Seltzer, MD;  Location: [facility];  Service: General;  Laterality: Right;  . TUBAL LIGATION  02/04/2002  . UPPER GI ENDOSCOPY      Family History  Problem Relation Age of Onset  . Hypertension Mother   . Diabetes Father   . Hypertension Sister   . Diabetes Paternal Grandmother   . Colon cancer Paternal Grandmother 69  . CAD Brother   . Diabetes Brother   . Breast cancer Maternal Aunt        dx >50  . Heart attack Paternal Grandfather   . Breast cancer Maternal Aunt        dx under 25  . Breast cancer Maternal Aunt        dx  under 31    Social History   Tobacco Use  . Smoking status: Never Smoker  . Smokeless tobacco: Never Used  Substance Use Topics  . Alcohol use: No  . Drug use: No    Outpatient Medications Prior to Visit  Medication Sig Dispense Refill  . Accu-Chek FastClix Lancets MISC Use as instructed. Inject into the skin twice daily 100 each 3  . atorvastatin (LIPITOR) 40 MG tablet Take 1 tablet (40 mg total) by mouth daily. 90 tablet 1  . Blood Glucose Calibration (ACCU-CHEK GUIDE CONTROL) LIQD 1 each by In Vitro route once as needed for up to 1  dose. 1 each 0  . cholecalciferol (VITAMIN D) 1000 units tablet Take 1,000 Units by mouth daily.    . clindamycin (CLINDAGEL) 1 % gel Apply topically 2 (two) times daily. 30 g 1  . ferrous sulfate 325 (65 FE) MG tablet Take 1 tablet (325 mg total) by mouth daily before breakfast. 90 tablet 1  . glucose blood (ACCU-CHEK GUIDE) test strip Use as instructed. Check blood glucose by fingerstick twice per day. 100 each 12  . Lancet Devices (ACCU-CHEK SOFTCLIX) lancets Use as instructed 1 each 0  . lisinopril (PRINIVIL,ZESTRIL) 10 MG tablet Take 10 mg by mouth daily.    . metFORMIN (GLUCOPHAGE XR) 500 MG 24 hr tablet Take  2 tablets (1,000 mg total) by mouth daily with breakfast. 180 tablet 1   Facility-Administered Medications Prior to Visit  Medication Dose Route Frequency Provider Last Rate Last Dose  . 0.9 %  sodium chloride infusion  500 mL Intravenous Once Nandigam, Kavitha V, MD        No Known Allergies  ROS Review of Systems  Constitutional: Positive for fatigue. Negative for chills and fever.  HENT: Negative for sore throat and trouble swallowing.   Eyes: Negative for photophobia.  Respiratory: Negative for cough and shortness of breath.   Cardiovascular: Negative for chest pain, palpitations and leg swelling.  Gastrointestinal: Negative for abdominal pain, blood in stool, constipation, diarrhea and nausea.  Endocrine: Positive for polydipsia and polyuria. Negative for cold intolerance, heat intolerance and polyphagia.  Musculoskeletal: Negative for arthralgias, back pain, gait problem, joint swelling and myalgias.  Neurological: Positive for numbness. Negative for dizziness and headaches.  Hematological: Negative for adenopathy. Does not bruise/bleed easily.      Objective:    Physical Exam  Constitutional: She is oriented to person, place, and time. She appears well-developed and well-nourished.  Neck: Normal range of motion. Neck supple. No thyromegaly present.    Cardiovascular: Normal rate and regular rhythm.  Pulmonary/Chest: Effort normal and breath sounds normal. No respiratory distress.  Abdominal: Soft. There is no abdominal tenderness. There is no rebound and no guarding.  Musculoskeletal: Normal range of motion.        General: No edema.  Lymphadenopathy:    She has no cervical adenopathy.  Neurological: She is alert and oriented to person, place, and time.  Skin: Skin is warm and dry.  No abnormality of the skin of the feet or nails  Psychiatric: She has a normal mood and affect. Her behavior is normal. Judgment and thought content normal.  Nursing note and vitals reviewed.  Sensory exam of the foot is normal, tested with the monofilament. Good pulses, no lesions or ulcers, good peripheral pulses.  BP 126/79 (BP Location: Right Arm, Patient Position: Sitting, Cuff Size: Large)   Pulse 93   Temp 98.1 F (36.7 C) (Oral)   Ht _0  (1.549 m)   Wt 163 lb (73.9 kg)   LMP 07/09/2017 (LMP Unknown)   SpO2 97%   BMI 30.80 kg/m  Wt Readings from Last 3 Encounters:  02/02/19 163 lb (73.9 kg)  10/14/18 156 lb (70.8 kg)  10/04/18 161 lb 8 oz (73.3 kg)     Health Maintenance Due  Topic Date Due  . OPHTHALMOLOGY EXAM  08/02/2018  . HEMOGLOBIN A1C  11/28/2018      Lab Results  Component Value Date   TSH 2.330 05/26/2017   Lab Results  Component Value Date   WBC 10.5 12/02/2018   HGB 11.4 (L) 12/02/2018   HCT 36.0 12/02/2018   MCV 97.8 12/02/2018   PLT 283 12/02/2018   Lab Results  Component Value Date   NA 136 12/02/2018   K 3.7 12/02/2018   CHLORIDE 106 09/15/2017   CO2 24 12/02/2018   GLUCOSE 89 12/02/2018   BUN 11 12/02/2018   CREATININE 0.76 12/02/2018   BILITOT 0.4 10/04/2018   ALKPHOS 95 10/04/2018   AST 17 10/04/2018   ALT 33 10/04/2018   PROT 7.7 10/04/2018   ALBUMIN 3.7 10/04/2018   CALCIUM 9.1 12/02/2018   ANIONGAP 10 12/02/2018   EGFR >60 09/15/2017   Lab Results  Component Value Date   CHOL 151  04/02/2017   Lab Results  Component Value Date   HDL 42 04/02/2017   Lab Results  Component Value Date   LDLCALC 79 04/02/2017   Lab Results  Component Value Date   TRIG 148 04/02/2017   Lab Results  Component Value Date   CHOLHDL 3.6 04/02/2017   Lab Results  Component Value Date   HGBA1C 6.6 (A) 05/30/2018      Assessment & Plan:  1. Type 2 diabetes mellitus without complication, without long-term current use of insulin (HCC) Blood sugar had previously been well controlled with last hemoglobin A1c in September 2019 of 6.6.  Patient reports recent increase in her blood sugars and patient has her glucometer which confirms that blood sugars have been higher than in the past.  Patient will have repeat hemoglobin A1c and blood sugar done at today's visit.  Patient will also have BMP to look for elevated blood sugar or renal abnormality.  Patient is currently on metformin which she will continue and will add Amaryl 2 mg before the first meal of the day.  Patient was made aware that she should not take the medication if she is not planning to eat in the next 10 to 15 minutes afterwards.  Patient should continue to monitor her blood sugars and should call if her fasting blood sugars are remaining greater than 160 after 2 weeks of starting the Amaryl as her dose can be increased if needed to 4 mg daily. - Glucose (CBG) - HgB J3H - Basic Metabolic Panel - glimepiride (AMARYL) 2 MG tablet; Take 1 tablet (2 mg total) by mouth daily with breakfast. To lower blood sugar  Dispense: 30 tablet; Refill: 5 - lisinopril (ZESTRIL) 10 MG tablet; Take 1 tablet (10 mg total) by mouth daily. To lower blood pressure  Dispense: 90 tablet; Refill: 3 - Lipid panel  2. Paresthesia of left foot Patient will have BMP, vitamin B12 and TSH levels at today's visit in follow-up of her abnormal sensation on the bottom of the left foot.  I also discussed with the patient that this could represent diabetic neuropathy  or chemotherapy induced peripheral neuropathy due to her prior treatments for breast cancer and fallopian tube cancer.  Patient is also on metformin which can lower the vitamin B12 levels leading to abnormal skin sensations. - Basic Metabolic Panel - Vitamin L45 - TSH  4. Hyperlipidemia associated with type 2 diabetes mellitus ([name]) Patient is fasting at today's visit and has history of hyperlipidemia for which she is currently on Lipitor 40 mg once daily.  Lipid panel will be repeated at today's visit.  Low-fat diet and exercise encouraged - Lipid panel  5. Essential hypertension Patient is currently on lisinopril for hypertension and her blood pressure is at goal of 130/80 or less.  She should continue to remain compliant with her blood pressure medication as well as continue a DASH diet and regular exercise and maintain a healthy weight - lisinopril (ZESTRIL) 10 MG tablet; Take 1 tablet (10 mg total) by mouth daily. To lower blood pressure  Dispense: 90 tablet; Refill: 3  An After Visit Summary was printed and given to the patient.   Follow-up: Return in about 4 months (around 06/05/2019) for DM- call if fasting blood sugars remain above 130.    Antony Blackbird, MD

## 2019-02-02 NOTE — Patient Instructions (Signed)

## 2019-02-03 ENCOUNTER — Other Ambulatory Visit: Payer: Self-pay

## 2019-02-03 ENCOUNTER — Telehealth: Payer: Self-pay | Admitting: Family Medicine

## 2019-02-03 DIAGNOSIS — E118 Type 2 diabetes mellitus with unspecified complications: Secondary | ICD-10-CM

## 2019-02-03 LAB — BASIC METABOLIC PANEL WITH GFR
BUN/Creatinine Ratio: 16 (ref 9–23)
BUN: 11 mg/dL (ref 6–24)
CO2: 21 mmol/L (ref 20–29)
Calcium: 9.7 mg/dL (ref 8.7–10.2)
Chloride: 100 mmol/L (ref 96–106)
Creatinine, Ser: 0.69 mg/dL (ref 0.57–1.00)
GFR calc Af Amer: 113 mL/min/1.73
GFR calc non Af Amer: 98 mL/min/1.73
Glucose: 146 mg/dL — ABNORMAL HIGH (ref 65–99)
Potassium: 4.2 mmol/L (ref 3.5–5.2)
Sodium: 136 mmol/L (ref 134–144)

## 2019-02-03 LAB — LIPID PANEL
Chol/HDL Ratio: 2.7 ratio (ref 0.0–4.4)
Cholesterol, Total: 99 mg/dL — ABNORMAL LOW (ref 100–199)
HDL: 37 mg/dL — ABNORMAL LOW
LDL Calculated: 39 mg/dL (ref 0–99)
Triglycerides: 113 mg/dL (ref 0–149)
VLDL Cholesterol Cal: 23 mg/dL (ref 5–40)

## 2019-02-03 LAB — HEMOGLOBIN A1C
Est. average glucose Bld gHb Est-mCnc: 194 mg/dL
Hgb A1c MFr Bld: 8.4 % — ABNORMAL HIGH (ref 4.8–5.6)

## 2019-02-03 LAB — TSH: TSH: 2.17 u[IU]/mL (ref 0.450–4.500)

## 2019-02-03 LAB — VITAMIN B12: Vitamin B-12: 551 pg/mL (ref 232–1245)

## 2019-02-03 MED ORDER — METFORMIN HCL ER 500 MG PO TB24: 1000.0000 mg | ORAL_TABLET | Freq: Every day | ORAL | 0 refills | Status: DC

## 2019-02-03 NOTE — Telephone Encounter (Signed)
Pt called to request medication advice; as to weather she is supposed to be taking both  -glimepiride (AMARYL) 2 MG tablet - metFORMIN (GLUCOPHAGE XR) 500 MG 24 hr tablet  Please follow up

## 2019-02-03 NOTE — Telephone Encounter (Signed)
Spoke with patient and informed her with what provider stated and she verbalized understanding and agreed with advise.  

## 2019-02-03 NOTE — Telephone Encounter (Signed)
She will continue the use of metformin as well as amaryl (glimepiride) 2 mg once daily before the first meal of the day

## 2019-02-04 ENCOUNTER — Encounter: Payer: Self-pay | Admitting: Family Medicine

## 2019-02-27 ENCOUNTER — Encounter: Payer: Self-pay | Admitting: Gynecologic Oncology

## 2019-02-27 ENCOUNTER — Inpatient Hospital Stay: Payer: Medicaid Other | Attending: Hematology and Oncology

## 2019-02-27 ENCOUNTER — Other Ambulatory Visit: Payer: Self-pay

## 2019-02-27 ENCOUNTER — Inpatient Hospital Stay (HOSPITAL_BASED_OUTPATIENT_CLINIC_OR_DEPARTMENT_OTHER): Payer: Medicaid Other | Admitting: Gynecologic Oncology

## 2019-02-27 VITALS — BP 112/64 | HR 81 | Temp 98.3°F | Resp 19 | Ht 61.0 in | Wt 162.2 lb

## 2019-02-27 DIAGNOSIS — Z170 Estrogen receptor positive status [ER+]: Secondary | ICD-10-CM | POA: Insufficient documentation

## 2019-02-27 DIAGNOSIS — E119 Type 2 diabetes mellitus without complications: Secondary | ICD-10-CM | POA: Diagnosis not present

## 2019-02-27 DIAGNOSIS — Z7984 Long term (current) use of oral hypoglycemic drugs: Secondary | ICD-10-CM | POA: Insufficient documentation

## 2019-02-27 DIAGNOSIS — Z803 Family history of malignant neoplasm of breast: Secondary | ICD-10-CM | POA: Diagnosis not present

## 2019-02-27 DIAGNOSIS — Z79899 Other long term (current) drug therapy: Secondary | ICD-10-CM

## 2019-02-27 DIAGNOSIS — C5701 Malignant neoplasm of right fallopian tube: Secondary | ICD-10-CM | POA: Diagnosis not present

## 2019-02-27 DIAGNOSIS — I10 Essential (primary) hypertension: Secondary | ICD-10-CM | POA: Diagnosis not present

## 2019-02-27 DIAGNOSIS — C50512 Malignant neoplasm of lower-outer quadrant of left female breast: Secondary | ICD-10-CM | POA: Diagnosis present

## 2019-02-27 DIAGNOSIS — Z800 Family history of malignant neoplasm of digestive organs: Secondary | ICD-10-CM | POA: Diagnosis not present

## 2019-02-27 NOTE — Progress Notes (Signed)
Follow-up Note: Gyn-Onc  Consult was initially requested by Dr. Pratt for the evaluation of Erin [name] 57 y.o. female  CC:  Chief Complaint  Patient presents with  . Fallopian tube cancer, carcinoma, right (HCC)    Assessment/Plan:  Erin [name]  is a 57 y.o.  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   S/p 6 cycles adjuvant carboplatin/taxol chemotherapy between April 2019-October, 2019.   Complete clinical response.   Maintenance olaparib/parp inhibitor not tested in this early stage population, therefore recommending transitioning to surveillance care with 3 monthly evaluations.  Will check today's CA 125 and repeat in 3 months with exam.   HPI: Erin [name] is a 58 year old woman who is deaf and is seen in consultation at the request of Dr Pratt for clinical stage IC fallopian tube cancer.   The patient has a history of ER PR negative HER-2 positive breast cancer on the left side which was diagnosed in August 2018.  It was treated with lumpectomy in September 2018 followed by Adriamycin and Taxol chemotherapy.  She was then placed on Herceptin with a plan to commence radiation therapy in March 2019.  She reported menorrhagia with irregular menses and therefore transvaginal ultrasound was performed on June 29, 2017.  It showed a uterus measuring 11.8 x 5.2 x 6.3 cm with a septated fluid-filled structure adjacent to the dome of the uterus.  The endometrium was thickened at 10.2 mm the right ovary measured 3.2 x 2.1 x 2.7 cm and the left ovary measured 9.8 x 7.8 x 11.8 cm.  The ovarian tissue is largely occupied by a complex cyst measuring 7.5 x 9.9 x 9.6 cm with a prominent septation.  There was no free fluid identified.  To follow-up with this ultrasound Dr. Pratt obtained an MRI of the pelvis on September 06, 2017  which revealed a uterus of normal size measuring 7.5 x 5 x 4.5 cm.  There is a bilobed cystic lesion presumably originating from the left adnexa which is different from the ultrasound report of a right adnexal complex cyst.  The cystic complex measures 12.8 x 10.9 x 8 cm.  The there was no nodularity.  The right ovary appeared normal.  It had no worrisome features.  Ca1 25 was drawn which was normal.  The patient was taken to the operating room on November 04, 2017 for laparoscopic assisted vaginal hysterectomy with BSO.  Surgical findings were significant for an 8-week size uterus with normal adnexa on the left and the right adnexal mass measured personally 12 cm.  It felt to be most consistent with a paratubal cyst.  There is normal upper abdomen reported washings were not obtained.  Surgery was uncomplicated.  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  The remainder of the entire right fallopian tube was submitted and no additional malignancy was identified.  The the associated ovarian cyst on the right was benign the left adnexa was unremarkable.  The uterus was also normal including the endometrium.  The patient is healed well from her surgery with no complaints.  She has a paternal grandmother who had colon cancer, a maternal half aunt with history of breast cancer, 2 paternal aunts with breast   cancer.  There is no family history of ovarian cancer.  She has had 2 prior NSVDs in the past.  She is otherwise fairly healthy.  Interval Hx:  She underwent genetic testing in June, 2019 which revealed a deleterious mutation in BRCA 2.   She received 6 cycles of carboplatin and paclitaxel chemotherapy between April, 2019 and October 2019.  She is on Herceptin maintenance therapy for her breast cancer.   She had not had CA 125 checked postop including throughout chemotherapy. CA 125 was  drawn as surveillance on 02/27/19.   Current Meds:  Outpatient Encounter Medications as of 02/27/2019  Medication Sig  . Accu-Chek FastClix Lancets MISC Use as instructed. Inject into the skin twice daily  . atorvastatin (LIPITOR) 40 MG tablet Take 1 tablet (40 mg total) by mouth daily.  . Blood Glucose Calibration (ACCU-CHEK GUIDE CONTROL) LIQD 1 each by In Vitro route once as needed for up to 1 dose.  . cholecalciferol (VITAMIN D) 1000 units tablet Take 1,000 Units by mouth daily.  . clindamycin (CLINDAGEL) 1 % gel Apply topically 2 (two) times daily.  . ferrous sulfate 325 (65 FE) MG tablet Take 1 tablet (325 mg total) by mouth daily before breakfast.  . glimepiride (AMARYL) 2 MG tablet Take 1 tablet (2 mg total) by mouth daily with breakfast. To lower blood sugar  . glucose blood (ACCU-CHEK GUIDE) test strip Use as instructed. Check blood glucose by fingerstick twice per day.  . Lancet Devices (ACCU-CHEK SOFTCLIX) lancets Use as instructed  . lisinopril (ZESTRIL) 10 MG tablet Take 1 tablet (10 mg total) by mouth daily. To lower blood pressure  . metFORMIN (GLUCOPHAGE XR) 500 MG 24 hr tablet Take 2 tablets (1,000 mg total) by mouth daily with breakfast.   Facility-Administered Encounter Medications as of 02/27/2019  Medication  . 0.9 %  sodium chloride infusion    Allergy: No Known Allergies  Social Hx:   Social History   Socioeconomic History  . Marital status: Legally Separated    Spouse name: Not on file  . Number of children: Not on file  . Years of education: Not on file  . Highest education level: Not on file  Occupational History  . Not on file  Social Needs  . Financial resource strain: Not on file  . Food insecurity    Worry: Not on file    Inability: Not on file  . Transportation needs    Medical: Not on file    Non-medical: Not on file  Tobacco Use  . Smoking status: Never Smoker  . Smokeless tobacco: Never Used  Substance and Sexual Activity  . Alcohol use:  No  . Drug use: No  . Sexual activity: Never    Birth control/protection: None  Lifestyle  . Physical activity    Days per week: Not on file    Minutes per session: Not on file  . Stress: Not on file  Relationships  . Social connections    Talks on phone: Not on file    Gets together: Not on file    Attends religious service: Not on file    Active member of club or organization: Not on file    Attends meetings of clubs or organizations: Not on file    Relationship status: Not on file  . Intimate partner violence    Fear of current or ex partner: Not on file    Emotionally abused: Not on file    Physically abused: Not on file      Forced sexual activity: Not on file  Other Topics Concern  . Not on file  Social History Narrative   1 boy and 1 girl   Resides in [place]   Seperated    Past Surgical Hx:  Past Surgical History:  Procedure Laterality Date  . BREAST LUMPECTOMY Left 06/10/2007  . BREAST LUMPECTOMY WITH RADIOACTIVE SEED AND SENTINEL LYMPH NODE BIOPSY Left 06/09/2017   Procedure: BREAST LUMPECTOMY WITH RADIOACTIVE SEED AND SENTINEL LYMPH NODE BIOPSY;  Surgeon: Hoxworth, Benjamin, MD;  Location: [facility];  Service: General;  Laterality: Left;  . COLONOSCOPY  11/02/2017   polyps  . LAPAROSCOPIC ASSISTED VAGINAL HYSTERECTOMY N/A 11/04/2017   Procedure: LAPAROSCOPIC ASSISTED VAGINAL HYSTERECTOMY;  Surgeon: Pratt, Tanya S, MD;  Location: [facility];  Service: Gynecology;  Laterality: N/A;  . LAPAROSCOPIC BILATERAL SALPINGO OOPHERECTOMY Bilateral 11/04/2017   Procedure: LAPAROSCOPIC BILATERAL SALPINGO OOPHORECTOMY;  Surgeon: Pratt, Tanya S, MD;  Location: [facility];  Service: Gynecology;  Laterality: Bilateral;  . MULTIPLE TOOTH EXTRACTIONS    . PORTACATH PLACEMENT Right 06/09/2017   Procedure: INSERTION PORT-A-CATH WITH US;  Surgeon: Hoxworth, Benjamin, MD;  Location: [facility];  Service: General;  Laterality: Right;  . TUBAL LIGATION  02/04/2002   . UPPER GI ENDOSCOPY      Past Medical Hx:  Past Medical History:  Diagnosis Date  . Anemia   . Arthritis    knees, elbows  . BRCA2 gene mutation positive 02/17/2018   BRCA2 c.4552del (p.[id number]*25)  Result reported out on 02/15/2018.   . Breast cancer (HCC) 05/2017   left  . Cough 06/03/2017  . Deaf   . Depression   . Dyspnea    states some SOB with ADLs and aanemia   . Family history of breast cancer   . Family history of breast cancer   . Family history of colon cancer   . Genetic testing 02/17/2018   The Common Hereditary Cancer Panel offered by [facility] includes sequencing and/or deletion duplication testing of the following 47 genes: APC, ATM, AXIN2, BARD1, BMPR1A, BRCA1, BRCA2, BRIP1, CDH1, CDKN2A (p14ARF), CDKN2A (p16INK4a), CKD4, CHEK2, CTNNA1, DICER1, EPCAM (Deletion/duplication testing only), GREM1 (promoter region deletion/duplication testing only), KIT, MEN1, MLH1, MSH2, MSH3, MSH6, MU  . High cholesterol   . History of blood transfusion 07/2017   [facility]  . Hypertension   . Non-insulin dependent type 2 diabetes mellitus (HCC)   . Runny nose 06/03/2017   clear drainage, per pt.  . SVD (spontaneous vaginal delivery)    x 2    Past Gynecological History:  SVDx 2, s/p hysterectomy Patient's last menstrual period was 07/09/2017 (lmp unknown).  Family Hx:  Family History  Problem Relation Age of Onset  . Hypertension Mother   . Diabetes Father   . Hypertension Sister   . Diabetes Paternal Grandmother   . Colon cancer Paternal Grandmother 65  . CAD Brother   . Diabetes Brother   . Breast cancer Maternal Aunt        dx >50  . Heart attack Paternal Grandfather   . Breast cancer Maternal Aunt        dx under 50  . Breast cancer Maternal Aunt        dx  under 50    Review of Systems:  Constitutional  Feels well,    ENT Normal appearing ears and nares bilaterally Skin/Breast  No rash, sores, jaundice, itching, dryness Cardiovascular  No chest pain,  shortness of breath, or edema  Pulmonary    No cough or wheeze.  Gastro Intestinal  No nausea, vomitting, or diarrhoea. No bright red blood per rectum, no abdominal pain, change in bowel movement, or constipation.  Genito Urinary  No frequency, urgency, dysuria,  Musculo Skeletal  No myalgia, arthralgia, joint swelling or pain  Neurologic  No weakness, numbness, change in gait,  Psychology  No depression, anxiety, insomnia.   Vitals:  Blood pressure 112/64, pulse 81, temperature 98.3 F (36.8 C), temperature source Oral, resp. rate 19, height 5' 1" (1.549 m), weight 162 lb 3.2 oz (73.6 kg), last menstrual period 07/09/2017, SpO2 100 %.  Physical Exam: WD in NAD Neck  Supple NROM, without any enlargements.  Lymph Node Survey No cervical supraclavicular or inguinal adenopathy Cardiovascular  Pulse normal rate, regularity and rhythm. S1 and S2 normal.  Lungs  Clear to auscultation bilateraly, without wheezes/crackles/rhonchi. Good air movement.  Skin  No rash/lesions/breakdown  Psychiatry  Alert and oriented to person, place, and time  Abdomen  Normoactive bowel sounds, abdomen soft, non-tender and obese without evidence of hernia. Soft incisions. No masses. Back No CVA tenderness Genito Urinary  No palpable masses in vagina or pelvis. Rectal  deferred Extremities  No bilateral cyanosis, clubbing or edema.    C , MD  02/27/2019, 12:16 PM        

## 2019-02-27 NOTE — Patient Instructions (Signed)
Please notify Dr Denman George at phone number [phone] if you notice vaginal bleeding, new pelvic or abdominal pains, bloating, feeling full easy, or a change in bladder or bowel function.   Please return to see Dr Denman George in 3 months for repeat lab and physical examination.

## 2019-02-28 LAB — CA 125: Cancer Antigen (CA) 125: 31.9 U/mL (ref 0.0–38.1)

## 2019-03-01 ENCOUNTER — Telehealth: Payer: Self-pay

## 2019-03-03 NOTE — Telephone Encounter (Signed)
Left message in cell phone VM for Patient to call back to discuss her CA-125 lab result from 02-27-19.

## 2019-03-03 NOTE — Telephone Encounter (Signed)
Told Ms Malkiewicz that the tumor marker CA-125 is WNL at 31.9.  It will be repeated at visit on 06-09-19 to keep following per Dr. Denman George.

## 2019-03-27 ENCOUNTER — Other Ambulatory Visit: Payer: Self-pay | Admitting: Hematology and Oncology

## 2019-03-27 DIAGNOSIS — Z9889 Other specified postprocedural states: Secondary | ICD-10-CM

## 2019-04-06 ENCOUNTER — Encounter: Payer: Self-pay | Admitting: Family Medicine

## 2019-04-06 ENCOUNTER — Other Ambulatory Visit: Payer: Self-pay

## 2019-04-06 ENCOUNTER — Ambulatory Visit: Payer: Medicaid Other | Attending: Family Medicine | Admitting: Family Medicine

## 2019-04-06 VITALS — BP 112/72 | HR 89 | Temp 98.0°F | Ht 61.0 in | Wt 160.0 lb

## 2019-04-06 DIAGNOSIS — Z803 Family history of malignant neoplasm of breast: Secondary | ICD-10-CM | POA: Diagnosis not present

## 2019-04-06 DIAGNOSIS — E1169 Type 2 diabetes mellitus with other specified complication: Secondary | ICD-10-CM | POA: Diagnosis not present

## 2019-04-06 DIAGNOSIS — M1990 Unspecified osteoarthritis, unspecified site: Secondary | ICD-10-CM | POA: Diagnosis not present

## 2019-04-06 DIAGNOSIS — E785 Hyperlipidemia, unspecified: Secondary | ICD-10-CM

## 2019-04-06 DIAGNOSIS — R202 Paresthesia of skin: Secondary | ICD-10-CM

## 2019-04-06 DIAGNOSIS — H9190 Unspecified hearing loss, unspecified ear: Secondary | ICD-10-CM | POA: Diagnosis not present

## 2019-04-06 DIAGNOSIS — Z789 Other specified health status: Secondary | ICD-10-CM

## 2019-04-06 DIAGNOSIS — R200 Anesthesia of skin: Secondary | ICD-10-CM | POA: Diagnosis present

## 2019-04-06 DIAGNOSIS — Z800 Family history of malignant neoplasm of digestive organs: Secondary | ICD-10-CM | POA: Insufficient documentation

## 2019-04-06 DIAGNOSIS — I10 Essential (primary) hypertension: Secondary | ICD-10-CM

## 2019-04-06 DIAGNOSIS — E169 Disorder of pancreatic internal secretion, unspecified: Secondary | ICD-10-CM | POA: Diagnosis not present

## 2019-04-06 DIAGNOSIS — H9193 Unspecified hearing loss, bilateral: Secondary | ICD-10-CM

## 2019-04-06 DIAGNOSIS — Z853 Personal history of malignant neoplasm of breast: Secondary | ICD-10-CM | POA: Diagnosis not present

## 2019-04-06 DIAGNOSIS — Z833 Family history of diabetes mellitus: Secondary | ICD-10-CM | POA: Insufficient documentation

## 2019-04-06 DIAGNOSIS — Z79899 Other long term (current) drug therapy: Secondary | ICD-10-CM

## 2019-04-06 DIAGNOSIS — Z8249 Family history of ischemic heart disease and other diseases of the circulatory system: Secondary | ICD-10-CM | POA: Diagnosis not present

## 2019-04-06 DIAGNOSIS — Z7984 Long term (current) use of oral hypoglycemic drugs: Secondary | ICD-10-CM | POA: Insufficient documentation

## 2019-04-06 LAB — GLUCOSE, POCT (MANUAL RESULT ENTRY): POC Glucose: 96 mg/dL (ref 70–99)

## 2019-04-06 MED ORDER — GABAPENTIN 100 MG PO CAPS: ORAL_CAPSULE | ORAL | 6 refills | Status: DC

## 2019-04-06 NOTE — Patient Instructions (Signed)

## 2019-04-06 NOTE — Progress Notes (Signed)
Established Patient Office Visit  Subjective:  Patient ID: Erin [name], female    DOB: Jan 21, 1961  Age: 58 y.o. MRN: [id number]  CC: Numbness in the right thumb and funny sensation in the right upper arm  Due to a language barrier, patient is accompanied by a sign language interpreter at today's visit  HPI SYMPHANY FLEISSNER presents for complaint of right arm pain. Patient also with Type 2 Diabetes and her last Hgb A1c was 8.4 on 02/02/2019.  At today's visit, patient reports several months of an abnormal sensation in her right upper arm that she cannot really describe.  She is also had some painful numbness in her right thumb.  Patient states that she had similar painful numbness in her thumb in the past while receiving chemotherapy for breast cancer and she wonders if her current thumb pain is chemotherapy related.  She has had no injury to her thumb.  Patient does not work outside of the home and denies any repetitive use of her hands/thumbs other than household chores and sign language.       She reports that her blood sugars have been well controlled with fasting blood sugars generally in the 90s to low 100s.  When she initially started taking Amaryl, she was taking the medication just before eating breakfast.  She states that about 1 to 2 hours after eating she would have sensation of shakiness, and feeling weak.  When she would check her blood sugars, they were sometimes in the 50s or 60s.  Patient started taking her Amaryl before lunch and has had no further episodes of shakiness or low blood sugar.  She feels that her diabetes is now well controlled.  She continues to take lisinopril.  She denies any cough associated with use of this medication.  She has had no headaches or dizziness related to her blood pressure.  She does get an occasional fleeting sharp sensation over her left eye/eyebrow area.  She would like to have a referral to an ophthalmologist.  She states that it has been several  years since she had a dilated eye exam.  She has noticed no acute changes in vision-no blurred vision or double vision.  Patient continues to take atorvastatin and has noticed no increase in muscle or joint pain other than the recent issues with a abnormal sensation in the right upper arm and right thumb.  She has had no chest pain or palpitations, no abdominal pain-no nausea or vomiting, no urinary frequency, urgency or dysuria and no peripheral edema.  Past Medical History:  Diagnosis Date   Anemia    Arthritis    knees, elbows   BRCA2 gene mutation positive 02/17/2018   BRCA2 c.4552del (p.[id number]*25)  Result reported out on 02/15/2018.    Breast cancer ([place]) 05/2017   left   Cough 06/03/2017   Deaf    Depression    Dyspnea    states some SOB with ADLs and aanemia    Family history of breast cancer    Family history of breast cancer    Family history of colon cancer    Genetic testing 02/17/2018   The Common Hereditary Cancer Panel offered by [facility] includes sequencing and/or deletion duplication testing of the following 47 genes: APC, ATM, AXIN2, BARD1, BMPR1A, BRCA1, BRCA2, BRIP1, CDH1, CDKN2A (p14ARF), CDKN2A (p16INK4a), CKD4, CHEK2, CTNNA1, DICER1, EPCAM (Deletion/duplication testing only), GREM1 (promoter region deletion/duplication testing only), KIT, MEN1, MLH1, MSH2, MSH3, MSH6, MU   High cholesterol  History of blood transfusion 07/2017   [facility]   Hypertension    Non-insulin dependent type 2 diabetes mellitus ([place])    Runny nose 06/03/2017   clear drainage, per pt.   SVD (spontaneous vaginal delivery)    x 2    Past Surgical History:  Procedure Laterality Date   BREAST LUMPECTOMY Left 06/10/2007   BREAST LUMPECTOMY WITH RADIOACTIVE SEED AND SENTINEL LYMPH NODE BIOPSY Left 06/09/2017   Procedure: BREAST LUMPECTOMY WITH RADIOACTIVE SEED AND SENTINEL LYMPH NODE BIOPSY;  Surgeon: Excell Seltzer, MD;  Location: [facility];  Service:  General;  Laterality: Left;   COLONOSCOPY  11/02/2017   polyps   LAPAROSCOPIC ASSISTED VAGINAL HYSTERECTOMY N/A 11/04/2017   Procedure: LAPAROSCOPIC ASSISTED VAGINAL HYSTERECTOMY;  Surgeon: [name], MD;  Location: [facility];  Service: Gynecology;  Laterality: N/A;   LAPAROSCOPIC BILATERAL SALPINGO OOPHERECTOMY Bilateral 11/04/2017   Procedure: LAPAROSCOPIC BILATERAL SALPINGO OOPHORECTOMY;  Surgeon: [name], MD;  Location: [facility];  Service: Gynecology;  Laterality: Bilateral;   MULTIPLE TOOTH EXTRACTIONS     PORTACATH PLACEMENT Right 06/09/2017   Procedure: INSERTION PORT-A-CATH WITH [place];  Surgeon: Excell Seltzer, MD;  Location: [facility];  Service: General;  Laterality: Right;   TUBAL LIGATION  02/04/2002   UPPER GI ENDOSCOPY      Family History  Problem Relation Age of Onset   Hypertension Mother    Diabetes Father    Hypertension Sister    Diabetes Paternal Grandmother    Colon cancer Paternal Grandmother 27   CAD Brother    Diabetes Brother    Breast cancer Maternal Aunt        dx >50   Heart attack Paternal Grandfather    Breast cancer Maternal Aunt        dx under 36   Breast cancer Maternal Aunt        dx  under 32    Social History   Socioeconomic History   Marital status: Legally Separated    Spouse name: Not on file   Number of children: Not on file   Years of education: Not on file   Highest education level: Not on file  Occupational History   Not on file  Social Needs   Financial resource strain: Not on file   Food insecurity    Worry: Not on file    Inability: Not on file   Transportation needs    Medical: Not on file    Non-medical: Not on file  Tobacco Use   Smoking status: Never Smoker   Smokeless tobacco: Never Used  Substance and Sexual Activity   Alcohol use: No   Drug use: No   Sexual activity: Never    Birth control/protection: None  Lifestyle   Physical activity    Days per  week: Not on file    Minutes per session: Not on file   Stress: Not on file  Relationships   Social connections    Talks on phone: Not on file    Gets together: Not on file    Attends religious service: Not on file    Active member of club or organization: Not on file    Attends meetings of clubs or organizations: Not on file    Relationship status: Not on file   Intimate partner violence    Fear of current or ex partner: Not on file    Emotionally abused: Not on file    Physically abused: Not on file  Forced sexual activity: Not on file  Other Topics Concern   Not on file  Social History Narrative   1 boy and 1 girl   Resides in [place]   Seperated    Outpatient Medications Prior to Visit  Medication Sig Dispense Refill   Accu-Chek FastClix Lancets MISC Use as instructed. Inject into the skin twice daily 100 each 3   atorvastatin (LIPITOR) 40 MG tablet Take 1 tablet (40 mg total) by mouth daily. 90 tablet 1   Blood Glucose Calibration (ACCU-CHEK GUIDE CONTROL) LIQD 1 each by In Vitro route once as needed for up to 1 dose. 1 each 0   cholecalciferol (VITAMIN D) 1000 units tablet Take 1,000 Units by mouth daily.     clindamycin (CLINDAGEL) 1 % gel Apply topically 2 (two) times daily. 30 g 1   ferrous sulfate 325 (65 FE) MG tablet Take 1 tablet (325 mg total) by mouth daily before breakfast. 90 tablet 1   glimepiride (AMARYL) 2 MG tablet Take 1 tablet (2 mg total) by mouth daily with breakfast. To lower blood sugar 30 tablet 5   glucose blood (ACCU-CHEK GUIDE) test strip Use as instructed. Check blood glucose by fingerstick twice per day. 100 each 12   Lancet Devices (ACCU-CHEK SOFTCLIX) lancets Use as instructed 1 each 0   lisinopril (ZESTRIL) 10 MG tablet Take 1 tablet (10 mg total) by mouth daily. To lower blood pressure 90 tablet 3   metFORMIN (GLUCOPHAGE XR) 500 MG 24 hr tablet Take 2 tablets (1,000 mg total) by mouth daily with breakfast. 180 tablet 0    Facility-Administered Medications Prior to Visit  Medication Dose Route Frequency Provider Last Rate Last Dose   0.9 %  sodium chloride infusion  500 mL Intravenous Once Nandigam, Kavitha V, MD        No Known Allergies  ROS Review of Systems  Constitutional: Positive for fatigue (Mild). Negative for chills and fever.  HENT: Positive for hearing loss (Patient is deaf). Negative for sore throat and trouble swallowing.   Eyes: Negative for photophobia and visual disturbance.  Respiratory: Negative for cough and shortness of breath.   Cardiovascular: Negative for chest pain, palpitations and leg swelling.  Gastrointestinal: Negative for abdominal pain, constipation, diarrhea and nausea.  Endocrine: Negative for polydipsia, polyphagia and polyuria.  Genitourinary: Negative for dysuria and frequency.  Musculoskeletal: Negative for back pain and gait problem.  Skin: Negative for rash and wound.  Neurological: Positive for numbness. Negative for dizziness and headaches.  Hematological: Negative for adenopathy. Does not bruise/bleed easily.  Psychiatric/Behavioral: Negative for self-injury and suicidal ideas. The patient is nervous/anxious (About numbness in her right thumb).       Objective:    Physical Exam  Constitutional: She is oriented to person, place, and time. She appears well-developed and well-nourished.  Neck: Normal range of motion. Neck supple. No JVD present. No thyromegaly present.  Cardiovascular: Normal rate, regular rhythm and intact distal pulses.  No carotid bruit  Pulmonary/Chest: Effort normal and breath sounds normal.  Abdominal: Soft. There is no abdominal tenderness. There is no rebound and no guarding.  Musculoskeletal: Normal range of motion.        General: No tenderness or edema.     Comments: No CVA tenderness, no discomfort with range of motion of the right arm or shoulder.  She does have some mild increase painful numbness with touching the palmar  surface of the right thumb especially over the DIP joint/tip of her finger.  No signs of infection-no increased warmth or redness, no felon, no discoloration or cyanosis  Lymphadenopathy:    She has no cervical adenopathy.  Neurological: She is alert and oriented to person, place, and time.  Decreased sensation to normal and light touch as well as to monofilament on the right palmar surface of the thumb.  Normal monofilament exam of the feet  Skin: Skin is warm and dry.  No active skin breakdown on the feet  Psychiatric: She has a normal mood and affect. Her behavior is normal. Judgment and thought content normal.  Nursing note and vitals reviewed. Diabetic foot exam- mild hammer toe deformities, normal monofilament exam in 10 out of 10 areas tested on both feet.  No active skin breakdown on the feet-no ulcers or calluses.  Good nail care.  Normal peripheral pulses.  LMP 07/09/2017 (LMP Unknown)  Wt Readings from Last 3 Encounters:  02/27/19 162 lb 3.2 oz (73.6 kg)  02/02/19 163 lb (73.9 kg)  10/14/18 156 lb (70.8 kg)     Health Maintenance Due  Topic Date Due   OPHTHALMOLOGY EXAM  08/02/2018    There are no preventive care reminders to display for this patient.  Lab Results  Component Value Date   TSH 2.170 02/02/2019   Lab Results  Component Value Date   WBC 10.5 12/02/2018   HGB 11.4 (L) 12/02/2018   HCT 36.0 12/02/2018   MCV 97.8 12/02/2018   PLT 283 12/02/2018   Lab Results  Component Value Date   NA 136 02/02/2019   K 4.2 02/02/2019   CHLORIDE 106 09/15/2017   CO2 21 02/02/2019   GLUCOSE 146 (H) 02/02/2019   BUN 11 02/02/2019   CREATININE 0.69 02/02/2019   BILITOT 0.4 10/04/2018   ALKPHOS 95 10/04/2018   AST 17 10/04/2018   ALT 33 10/04/2018   PROT 7.7 10/04/2018   ALBUMIN 3.7 10/04/2018   CALCIUM 9.7 02/02/2019   ANIONGAP 10 12/02/2018   EGFR >60 09/15/2017   Lab Results  Component Value Date   CHOL 99 (L) 02/02/2019   Lab Results  Component Value  Date   HDL 37 (L) 02/02/2019   Lab Results  Component Value Date   LDLCALC 39 02/02/2019   Lab Results  Component Value Date   TRIG 113 02/02/2019   Lab Results  Component Value Date   CHOLHDL 2.7 02/02/2019   Lab Results  Component Value Date   HGBA1C 8.4 (H) 02/02/2019      Assessment & Plan:  1. Type 2 diabetes mellitus with other specified complication, without long-term current use of insulin ([place]) She believes that her blood sugars have been well controlled on her current medication.  She was having difficulty with Amaryl 2 mg before breakfast as this would later cause her to feel shaky and jittery and to have low blood sugars but since switching Amaryl use to prelunch she has had no further symptoms of hypoglycemia and her fasting blood sugars are generally in the 90s to low 100s.  Patient will have CMP done in follow-up of her diabetes and medication use.  She will also have TSH as this can also sometimes be associated with an endocrine disorder such as diabetes.  She will also have vitamin B12 level due to some numbness in the right thumb as well as use of metformin which can lower vitamin B12 levels.  She will also have a urine micro albumin/creatinine ratio and she is currently on lisinopril for renal protection.  Patient  will be referred to ophthalmology for yearly dilated diabetic eye exam.  Patient also reports some occasional sharp pain over the left eye and she should also mention this to the neurologist as well as ophthalmologist.  Patient will continue her current diabetes medications of metformin and Amaryl - POCT glucose (manual entry) - Ambulatory referral to Ophthalmology - Comprehensive metabolic panel - TSH - Microalbumin/Creatinine Ratio, Urine - Vitamin B12  2. Paresthesia of finger Patient with complaint of painful numbness in her right thumb and she reports that this did happen in the past while she was receiving chemotherapy for breast cancer.  Discussed  with the patient that TSH level and vitamin B12 will be checked at today's visit to look for other possible causes of decreased sensation/neuropathy.  Patient will also be referred to neurology and may need nerve conduction studies to determine cause of her neuropathy.  Patient with negative Tinel and Phalen on exam and patient had decreased sensation over the palmar portion of the right thumb with use of monofilament and to regular touch.  Patient also with normal monofilament sensation in the feet.  Patient will be placed on gabapentin to take 100 mg at bedtime and she can increase to twice daily if needed.  Patient does not believe that she will need this medication during the day as the painful aspect of the numbness generally occurs only at night.  Patient is also encouraged to mention the return of thumb numbness to her oncologist when she follows up in August. - Ambulatory referral to Neurology - gabapentin (NEURONTIN) 100 MG capsule; One or two capsules at bedtime as needed for thumb pain  Dispense: 60 capsule; Refill: 6 - TSH - Vitamin B12  3. Essential hypertension Continue the use of lisinopril.  Blood pressures currently controlled.  Discussed DASH diet, continued exercise and blood pressure goal of 130/80 or less  4. Hyperlipidemia associated with type 2 diabetes mellitus [facility]) Patient will have complete metabolic panel and lipid panel in follow-up of hyperlipidemia at today's visit.  She will also be notified if a change is needed in her statin medication, atorvastatin 40 mg daily based on today's labs. - Comprehensive metabolic panel - Lipid panel  5. Encounter for long-term (current) use of medications Patient will have CMP and vitamin B12 level done in follow-up of her long-term use of medications for treatment of diabetes, hyperlipidemia, hypertension including use of statin medication which can affect liver enzymes as well as metformin which can decrease vitamin B12 levels. -  Comprehensive metabolic panel - Vitamin B34  Patient was accompanied by an interpreter due to communication barrier due to deafness  An After Visit Summary was printed and given to the patient.  Follow-up: Return in about 4 months (around 08/07/2019) for DM-4-5 month follow-up and as needed.   Antony Blackbird, MD

## 2019-04-07 LAB — COMPREHENSIVE METABOLIC PANEL WITH GFR
ALT: 22 IU/L (ref 0–32)
AST: 18 IU/L (ref 0–40)
Albumin/Globulin Ratio: 1.3 (ref 1.2–2.2)
Albumin: 4.3 g/dL (ref 3.8–4.9)
Alkaline Phosphatase: 101 IU/L (ref 39–117)
BUN/Creatinine Ratio: 12 (ref 9–23)
BUN: 9 mg/dL (ref 6–24)
Bilirubin Total: 0.3 mg/dL (ref 0.0–1.2)
CO2: 24 mmol/L (ref 20–29)
Calcium: 9.7 mg/dL (ref 8.7–10.2)
Chloride: 102 mmol/L (ref 96–106)
Creatinine, Ser: 0.75 mg/dL (ref 0.57–1.00)
GFR calc Af Amer: 104 mL/min/1.73
GFR calc non Af Amer: 90 mL/min/1.73
Globulin, Total: 3.2 g/dL (ref 1.5–4.5)
Glucose: 101 mg/dL — ABNORMAL HIGH (ref 65–99)
Potassium: 4.8 mmol/L (ref 3.5–5.2)
Sodium: 143 mmol/L (ref 134–144)
Total Protein: 7.5 g/dL (ref 6.0–8.5)

## 2019-04-07 LAB — LIPID PANEL
Chol/HDL Ratio: 2.8 ratio (ref 0.0–4.4)
Cholesterol, Total: 104 mg/dL (ref 100–199)
HDL: 37 mg/dL — ABNORMAL LOW
LDL Calculated: 53 mg/dL (ref 0–99)
Triglycerides: 72 mg/dL (ref 0–149)
VLDL Cholesterol Cal: 14 mg/dL (ref 5–40)

## 2019-04-07 LAB — TSH: TSH: 0.821 u[IU]/mL (ref 0.450–4.500)

## 2019-04-07 LAB — MICROALBUMIN / CREATININE URINE RATIO
Creatinine, Urine: 180.9 mg/dL
Microalb/Creat Ratio: 5 mg/g{creat} (ref 0–29)
Microalbumin, Urine: 9.3 ug/mL

## 2019-04-07 LAB — VITAMIN B12: Vitamin B-12: >2000 pg/mL — ABNORMAL HIGH (ref 232–1245)

## 2019-04-13 ENCOUNTER — Telehealth: Payer: Self-pay | Admitting: *Deleted

## 2019-04-13 ENCOUNTER — Ambulatory Visit: Payer: Self-pay | Admitting: Neurology

## 2019-04-13 NOTE — Telephone Encounter (Signed)
No showed new patient appointment. 

## 2019-04-14 ENCOUNTER — Telehealth: Payer: Self-pay | Admitting: *Deleted

## 2019-04-14 NOTE — Telephone Encounter (Signed)
Per Patient she will continue taking her medication until her next appt.

## 2019-04-14 NOTE — Telephone Encounter (Signed)
We can not force her to take diabetes medication. We can only recommend. If she plans to stop taking the medication she may be at risk of her diabetes worsening. Thank you.

## 2019-04-14 NOTE — Telephone Encounter (Signed)
Patient called stating she is on Metformin and she do not want to take this medication anymore. Per pt, she saw that this medication can cause a lot of health issues. Per pt she thinks that Metformin is causing her to have less energy and feeling tired. Per pt she want's to start taking another medication. Per pt she would like to try Turmeric, Curcumin, Cinnamon, Vit B Complex, Immuneti to help her body feel better and help her DM.

## 2019-04-17 ENCOUNTER — Encounter: Payer: Self-pay | Admitting: Neurology

## 2019-04-17 ENCOUNTER — Ambulatory Visit (INDEPENDENT_AMBULATORY_CARE_PROVIDER_SITE_OTHER): Payer: Medicaid Other | Admitting: Neurology

## 2019-04-17 ENCOUNTER — Other Ambulatory Visit: Payer: Self-pay

## 2019-04-17 VITALS — BP 108/68 | HR 84 | Temp 98.0°F | Ht 61.0 in | Wt 165.0 lb

## 2019-04-17 DIAGNOSIS — R202 Paresthesia of skin: Secondary | ICD-10-CM | POA: Insufficient documentation

## 2019-04-17 NOTE — Progress Notes (Signed)
PATIENT: Erin [name] DOB: January 07, 1961  Chief Complaint  Patient presents with  . New Patient (Initial Visit)    pt alone, new room. pt is deaf, interptreter present. Right arm tingling, rt thumb. about 3-4 months ago this tingling contines to get worse.      HISTORICAL  CHANY WOOLWORTH is a 58 years old female, seen in request by her primary care physician Dr. Chapman Fitch, [place] for evaluation of right hand paresthesia, initial evaluation was on April 17 2019.   I have reviewed and summarized the referring note from the referring physician. She has PMH of HTN, DM, HLD, left breast cancer, she presented with 3 months history of right thumb and index finger paresthesia, intermittent numbness, but no weakness, often woke up from sleep noticed right hand numbness, has to shake her right hand, right wrist discomfort, she denies significant neck pain, paresthesia radiating up to right arm some.  She denies left arm and bilateral lower extremity paresthesia, no gait abnormality.   Laboratory evaluations showed Vit B12 >2000,  TSH, Lipid Panel LDL 53, CMP, A1C 8.4, ferritin 316.  REVIEW OF SYSTEMS: Full 14 system review of systems performed and notable only for as above All other review of systems were negative.  ALLERGIES: No Known Allergies  HOME MEDICATIONS: Current Outpatient Medications  Medication Sig Dispense Refill  . Accu-Chek FastClix Lancets MISC Use as instructed. Inject into the skin twice daily 100 each 3  . atorvastatin (LIPITOR) 40 MG tablet Take 1 tablet (40 mg total) by mouth daily. 90 tablet 1  . Blood Glucose Calibration (ACCU-CHEK GUIDE CONTROL) LIQD 1 each by In Vitro route once as needed for up to 1 dose. 1 each 0  . cholecalciferol (VITAMIN D) 1000 units tablet Take 1,000 Units by mouth daily.    . clindamycin (CLINDAGEL) 1 % gel Apply topically 2 (two) times daily. 30 g 1  . gabapentin (NEURONTIN) 100 MG capsule One or two capsules at bedtime as needed for thumb pain  60 capsule 6  . glimepiride (AMARYL) 2 MG tablet Take 1 tablet (2 mg total) by mouth daily with breakfast. To lower blood sugar 30 tablet 5  . glucose blood (ACCU-CHEK GUIDE) test strip Use as instructed. Check blood glucose by fingerstick twice per day. 100 each 12  . Lancet Devices (ACCU-CHEK SOFTCLIX) lancets Use as instructed 1 each 0  . lisinopril (ZESTRIL) 10 MG tablet Take 1 tablet (10 mg total) by mouth daily. To lower blood pressure 90 tablet 3  . metFORMIN (GLUCOPHAGE XR) 500 MG 24 hr tablet Take 2 tablets (1,000 mg total) by mouth daily with breakfast. 180 tablet 0  . ferrous sulfate 325 (65 FE) MG tablet Take 1 tablet (325 mg total) by mouth daily before breakfast. 90 tablet 1   Current Facility-Administered Medications  Medication Dose Route Frequency Provider Last Rate Last Dose  . 0.9 %  sodium chloride infusion  500 mL Intravenous Once Nandigam, Venia Minks, MD        PAST MEDICAL HISTORY: Past Medical History:  Diagnosis Date  . Anemia   . Arthritis    knees, elbows  . BRCA2 gene mutation positive 02/17/2018   BRCA2 c.4552del (p.Glu1518Asnfs*25)  Result reported out on 02/15/2018.   . Breast cancer ([place]) 05/2017   left  . Cough 06/03/2017  . Deaf   . Depression   . Dyspnea    states some SOB with ADLs and aanemia   . Family history of breast cancer   .  Family history of breast cancer   . Family history of colon cancer   . Genetic testing 02/17/2018   The Common Hereditary Cancer Panel offered by [facility] includes sequencing and/or deletion duplication testing of the following 47 genes: APC, ATM, AXIN2, BARD1, BMPR1A, BRCA1, BRCA2, BRIP1, CDH1, CDKN2A (p14ARF), CDKN2A (p16INK4a), CKD4, CHEK2, CTNNA1, DICER1, EPCAM (Deletion/duplication testing only), GREM1 (promoter region deletion/duplication testing only), KIT, MEN1, MLH1, MSH2, MSH3, MSH6, MU  . High cholesterol   . History of blood transfusion 07/2017   [facility]  . Hypertension   . Non-insulin dependent type 2 diabetes  mellitus ([place])   . Runny nose 06/03/2017   clear drainage, per pt.  . SVD (spontaneous vaginal delivery)    x 2    PAST SURGICAL HISTORY: Past Surgical History:  Procedure Laterality Date  . BREAST LUMPECTOMY Left 06/10/2007  . BREAST LUMPECTOMY WITH RADIOACTIVE SEED AND SENTINEL LYMPH NODE BIOPSY Left 06/09/2017   Procedure: BREAST LUMPECTOMY WITH RADIOACTIVE SEED AND SENTINEL LYMPH NODE BIOPSY;  Surgeon: Excell Seltzer, MD;  Location: [place];  Service: General;  Laterality: Left;  . COLONOSCOPY  11/02/2017   polyps  . LAPAROSCOPIC ASSISTED VAGINAL HYSTERECTOMY N/A 11/04/2017   Procedure: LAPAROSCOPIC ASSISTED VAGINAL HYSTERECTOMY;  Surgeon: [name], MD;  Location: [facility];  Service: Gynecology;  Laterality: N/A;  . LAPAROSCOPIC BILATERAL SALPINGO OOPHERECTOMY Bilateral 11/04/2017   Procedure: LAPAROSCOPIC BILATERAL SALPINGO OOPHORECTOMY;  Surgeon: [name], MD;  Location: [facility];  Service: Gynecology;  Laterality: Bilateral;  . MULTIPLE TOOTH EXTRACTIONS    . PORTACATH PLACEMENT Right 06/09/2017   Procedure: INSERTION PORT-A-CATH WITH [place];  Surgeon: Excell Seltzer, MD;  Location: [place];  Service: General;  Laterality: Right;  . TUBAL LIGATION  02/04/2002  . UPPER GI ENDOSCOPY      FAMILY HISTORY: Family History  Problem Relation Age of Onset  . Hypertension Mother   . Diabetes Father   . Hypertension Sister   . Diabetes Paternal Grandmother   . Colon cancer Paternal Grandmother 39  . CAD Brother   . Diabetes Brother   . Breast cancer Maternal Aunt        dx >50  . Heart attack Paternal Grandfather   . Breast cancer Maternal Aunt        dx under 5  . Breast cancer Maternal Aunt        dx  under 50    SOCIAL HISTORY: Social History   Socioeconomic History  . Marital status: Legally Separated    Spouse name: Not on file  . Number of children: Not on file  . Years of education: Not on file  . Highest education  level: Not on file  Occupational History  . Not on file  Social Needs  . Financial resource strain: Not on file  . Food insecurity    Worry: Not on file    Inability: Not on file  . Transportation needs    Medical: Not on file    Non-medical: Not on file  Tobacco Use  . Smoking status: Never Smoker  . Smokeless tobacco: Never Used  Substance and Sexual Activity  . Alcohol use: No  . Drug use: No  . Sexual activity: Never    Birth control/protection: None  Lifestyle  . Physical activity    Days per week: Not on file    Minutes per session: Not on file  . Stress: Not on file  Relationships  . Social connections  Talks on phone: Not on file    Gets together: Not on file    Attends religious service: Not on file    Active member of club or organization: Not on file    Attends meetings of clubs or organizations: Not on file    Relationship status: Not on file  . Intimate partner violence    Fear of current or ex partner: Not on file    Emotionally abused: Not on file    Physically abused: Not on file    Forced sexual activity: Not on file  Other Topics Concern  . Not on file  Social History Narrative   1 boy and 1 girl   Resides in [place]:   04/17/19 0854  BP: 108/68  Pulse: 84  Temp: 98 F (36.7 C)  Weight: 165 lb (74.8 kg)  Height: 5' 1"  (1.549 m)    Not recorded      Body mass index is 31.18 kg/m.  PHYSICAL EXAMNIATION:  Gen: NAD, conversant, well nourised, obese, well groomed                     Cardiovascular: Regular rate rhythm, no peripheral edema, warm, nontender. Eyes: Conjunctivae clear without exudates or hemorrhage Neck: Supple, no carotid bruits. Pulmonary: Clear to auscultation bilaterally   NEUROLOGICAL EXAM:  MENTAL STATUS: Speech:    Speech is normal; fluent and spontaneous with normal comprehension.  Cognition:     Orientation to time, place and person     Normal recent and remote  memory     Normal Attention span and concentration     Normal Language, naming, repeating,spontaneous speech     Fund of knowledge   CRANIAL NERVES: CN II: Visual fields are full to confrontation.  Pupils are round equal and briskly reactive to light. CN III, IV, VI: extraocular movement are normal. No ptosis. CN V: Facial sensation is intact to pinprick in all 3 divisions bilaterally. Corneal responses are intact.  CN VII: Face is symmetric with normal eye closure and smile. CN VIII: Hearing is normal to rubbing fingers CN IX, X: Palate elevates symmetrically. Phonation is normal. CN XI: Head turning and shoulder shrug are intact CN XII: Tongue is midline with normal movements and no atrophy.  MOTOR: There is no pronator drift of out-stretched arms. Muscle bulk and tone are normal. Muscle strength is normal, with exception of mild right abductor pollicis brevis weakness, right wrist tinel sign was present.  REFLEXES: Reflexes are 2+ and symmetric at the biceps, triceps, knees, and ankles. Plantar responses are flexor.  SENSORY: Intact to light touch, pinprick, positional sensation and vibratory sensation are intact in fingers and toes.  COORDINATION: Rapid alternating movements and fine finger movements are intact. There is no dysmetria on finger-to-nose and heel-knee-shin.    GAIT/STANCE: Posture is normal. Gait is steady with normal steps, base, arm swing, and turning. Heel and toe walking are normal. Tandem gait is normal.  Romberg is absent.   DIAGNOSTIC DATA (LABS, IMAGING, TESTING) - I reviewed patient records, labs, notes, testing and imaging myself where available.   ASSESSMENT AND PLAN  MEGAN PRESTI is a 58 y.o. female   Right hand paresthesia, with right wrist tenderness upon deep palpitation.  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 Ph.D.  [facility] [address], [place], [place] [address] Ph:  [phone] Fax: [phone]  CC: Antony Blackbird, MD

## 2019-04-24 ENCOUNTER — Telehealth: Payer: Self-pay | Admitting: Family Medicine

## 2019-04-24 NOTE — Telephone Encounter (Signed)
New Message   Pt is calling wanting to know if she can take any kind of immunity supplement. She is interested in Tumeric and an Advanced immunity defense. Please f/u

## 2019-04-25 ENCOUNTER — Telehealth: Payer: Self-pay | Admitting: Family Medicine

## 2019-04-25 NOTE — Telephone Encounter (Signed)
She should be okay taking those supplements but she read read the packaging concerning any side effects or interactions with other medications

## 2019-04-25 NOTE — Telephone Encounter (Signed)
Follow up   Pt calling to in regards to the message that was sent about the Tumeric and immune support. Please f/u

## 2019-04-25 NOTE — Telephone Encounter (Signed)
Spoke with patient and informed her message was forwarded to provider and patient verbalized understanding.

## 2019-04-26 NOTE — Telephone Encounter (Signed)
Informed patient with what provider stated and she verbalized understanding.  

## 2019-04-27 DIAGNOSIS — E119 Type 2 diabetes mellitus without complications: Secondary | ICD-10-CM | POA: Diagnosis not present

## 2019-04-27 DIAGNOSIS — H5213 Myopia, bilateral: Secondary | ICD-10-CM | POA: Diagnosis not present

## 2019-04-27 DIAGNOSIS — E113291 Type 2 diabetes mellitus with mild nonproliferative diabetic retinopathy without macular edema, right eye: Secondary | ICD-10-CM | POA: Diagnosis not present

## 2019-04-27 DIAGNOSIS — H2513 Age-related nuclear cataract, bilateral: Secondary | ICD-10-CM | POA: Diagnosis not present

## 2019-04-27 DIAGNOSIS — H524 Presbyopia: Secondary | ICD-10-CM | POA: Diagnosis not present

## 2019-04-27 DIAGNOSIS — H40023 Open angle with borderline findings, high risk, bilateral: Secondary | ICD-10-CM | POA: Diagnosis not present

## 2019-04-27 DIAGNOSIS — H1045 Other chronic allergic conjunctivitis: Secondary | ICD-10-CM | POA: Diagnosis not present

## 2019-04-27 DIAGNOSIS — H52221 Regular astigmatism, right eye: Secondary | ICD-10-CM | POA: Diagnosis not present

## 2019-04-27 NOTE — Assessment & Plan Note (Signed)
05/20/2017:Left lumpectomy: IDC grade 3, 1.2 cm, DCIS is present, margins negative, 0/6 lymph nodes negative, ER 0%, PR 0%, HER-2 positive ratio 2.28, Ki-67 20%, T1 BN 0 stage IA Taxol/Herceptin completed on 09/29/2017, went on to receive every 3 week adjuvant Herceptin on 10/20/2017 completed 06/28/2018  BRCA2 mutation: Patient decided not to undergo bilateral mastectomies. She wishes to follow annually with mammograms and breast MRIs  Breast MRI 11/07/2018: 2 irregular masses medial portion of the right breast 5 mm and 6 mm 11/28/2018: MRI guided biopsy: Benign 04/28/2019: Mammogram: No evidence of malignancy, breast density category B

## 2019-04-27 NOTE — Assessment & Plan Note (Signed)
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

## 2019-04-28 ENCOUNTER — Ambulatory Visit
Admission: RE | Admit: 2019-04-28 | Discharge: 2019-04-28 | Disposition: A | Discharge status: Home / Self Care | Payer: Medicaid Other | Source: Ambulatory Visit | Attending: Hematology and Oncology | Admitting: Hematology and Oncology

## 2019-04-28 ENCOUNTER — Other Ambulatory Visit: Payer: Self-pay

## 2019-04-28 DIAGNOSIS — H5213 Myopia, bilateral: Secondary | ICD-10-CM | POA: Diagnosis not present

## 2019-04-28 DIAGNOSIS — Z9889 Other specified postprocedural states: Secondary | ICD-10-CM

## 2019-04-28 DIAGNOSIS — R922 Inconclusive mammogram: Secondary | ICD-10-CM | POA: Diagnosis not present

## 2019-05-01 NOTE — Progress Notes (Signed)
Patient Care Team: Antony Blackbird, MD as PCP - General (Family Medicine) Excell Seltzer, MD as Consulting Physician (General Surgery) [name], MD as Consulting Physician (Hematology and Oncology) Kyung Rudd, MD as Consulting Physician (Radiation Oncology) [name], MD as Consulting Physician (Gynecologic Oncology) Delice Bison Charlestine Massed, NP as Nurse Practitioner (Hematology and Oncology)  DIAGNOSIS:    ICD-10-CM   1. 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 Malignant neoplasm of lower-outer quadrant of left breast of female, estrogen receptor negative ([place])  C50.512    Z17.1     SUMMARY OF ONCOLOGIC HISTORY: Oncology History  Malignant neoplasm of lower-outer quadrant of left breast of female, estrogen receptor negative ([place])  05/11/2017 Initial Diagnosis   Left breast biopsy 3:30 position 6 cm from nipple: IDC with DCIS, lymphovascular invasion present, grade 2-3, ER 0%, PR 0%, HER-2 positive ratio 2.28, Ki-67 20%, 1 cm lesion left breast, T1b N0 stage IA clinical stage   06/09/2017 Surgery   Left lumpectomy: IDC grade 3, 1.2 cm, DCIS is present, margins negative, 0/6 lymph nodes negative, ER 0%, PR 0%, HER-2 positive ratio 2.28, Ki-67 20%, T1 BN 0 stage IA   07/09/2017 - 09/29/2017 Adjuvant Chemotherapy   Taxol/Herceptin x 12 completed on 09/29/2017, went on to receive every 3 week adjuvant Herceptin through 06/28/2018   11/25/2017 - 01/07/2018 Radiation Therapy    The patient initially received a dose of 50.4 Gy in 28 fractions to the breast using whole-breast tangent fields. This was delivered using a 3-D conformal technique. The patient then received a boost to the seroma. This delivered an additional 10 Gy in 5 fractions using a 3-D technique. The total dose was 60.4 Gy.   02/15/2018 Genetic Testing   The Common Hereditary Cancer Panel offered by [facility] includes sequencing and/or deletion duplication testing of the following 47 genes: APC, ATM, AXIN2,  BARD1, BMPR1A, BRCA1, BRCA2, BRIP1, CDH1, CDKN2A (p14ARF), CDKN2A (p16INK4a), CKD4, CHEK2, CTNNA1, DICER1, EPCAM (Deletion/duplication testing only), GREM1 (promoter region deletion/duplication testing only), KIT, MEN1, MLH1, MSH2, MSH3, MSH6, MUTYH, NBN, NF1, NHTL1, PALB2, PDGFRA, PMS2, POLD1, POLE, PTEN, RAD50, RAD51C, RAD51D, SDHB, SDHC, SDHD, SMAD4, SMARCA4. STK11, TP53, TSC1, TSC2, and VHL.  The following genes were evaluated for sequence changes only: SDHA and HOXB13 c.251G>A variant only.   Results: POSITIVE. 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 with above-mentioned history of HER-2 positive breast cancer and fallopian tube cancer treated with lumpectomy, adjuvant chemotherapy, and radiation. Mammogram on 04/28/19 showed no evidence of malignancy bilaterally. She presents to the clinic today for follow-up.   REVIEW OF SYSTEMS:   Constitutional: Denies fevers, chills or abnormal weight loss Eyes: Denies blurriness of vision Ears, nose, mouth, throat, and face: Denies mucositis or sore throat Respiratory: Denies cough, dyspnea or wheezes Cardiovascular: Denies palpitation, chest discomfort Gastrointestinal: Denies nausea, heartburn or change in bowel habits Skin: Denies abnormal skin rashes Lymphatics: Denies new lymphadenopathy or easy bruising Neurological: Denies numbness,  tingling or new weaknesses Behavioral/Psych: Mood is stable,  no new changes  Extremities: No lower extremity edema Breast: denies any pain or lumps or nodules in either breasts All other systems were reviewed with the patient and are negative.  I have reviewed the past medical history, past surgical history, social history and family history with the patient and they are unchanged from previous note.  ALLERGIES:  has No Known Allergies.  MEDICATIONS:  Current Outpatient Medications  Medication Sig Dispense Refill  . Accu-Chek FastClix Lancets MISC Use as instructed. Inject into the skin twice daily 100 each 3  . atorvastatin (LIPITOR) 40 MG tablet Take 1 tablet (40 mg total) by mouth daily. 90 tablet 1  . Blood Glucose Calibration (ACCU-CHEK GUIDE CONTROL) LIQD 1 each by In Vitro route once as needed for up to 1 dose. 1 each 0  . cholecalciferol (VITAMIN D) 1000 units tablet Take 1,000 Units by mouth daily.    . clindamycin (CLINDAGEL) 1 % gel Apply topically 2 (two) times daily. 30 g 1  . ferrous sulfate 325 (65 FE) MG tablet Take 1 tablet (325 mg total) by mouth daily before breakfast. 90 tablet 1  . gabapentin (NEURONTIN) 100 MG capsule One or two capsules at bedtime as needed for thumb pain 60 capsule 6  . glimepiride (AMARYL) 2 MG tablet Take 1 tablet (2 mg total) by mouth daily with breakfast. To lower blood sugar 30 tablet 5  . glucose blood (ACCU-CHEK GUIDE) test strip Use as instructed. Check blood glucose by fingerstick twice per day. 100 each 12  . Lancet Devices (ACCU-CHEK SOFTCLIX) lancets Use as instructed 1 each 0  . lisinopril (ZESTRIL) 10 MG tablet Take 1 tablet (10 mg total) by mouth daily. To lower blood pressure 90 tablet 3  . metFORMIN (GLUCOPHAGE XR) 500 MG 24 hr tablet Take 2 tablets (1,000 mg total) by mouth daily with breakfast. 180 tablet 0   Current Facility-Administered Medications  Medication Dose Route Frequency Provider Last Rate Last Dose  . 0.9 %  sodium chloride infusion  500 mL Intravenous Once Nandigam, Venia Minks,  MD        PHYSICAL EXAMINATION: ECOG PERFORMANCE STATUS: 1 - Symptomatic but completely ambulatory  Vitals:   05/02/19 1041  BP: 115/61  Pulse: 82  Resp: 16  Temp: 98.5 F (36.9 C)  SpO2: 100%   Filed Weights   05/02/19 1041  Weight: 164 lb (74.4 kg)    GENERAL: alert, no distress and comfortable SKIN: skin color, texture, turgor are normal, no rashes or significant lesions EYES: normal, Conjunctiva are pink and non-injected, sclera clear OROPHARYNX: no exudate, no erythema and lips, buccal mucosa, and tongue normal  NECK: supple, thyroid normal size, non-tender, without nodularity LYMPH: no palpable lymphadenopathy in the cervical, axillary or inguinal LUNGS: clear to auscultation and percussion with normal breathing effort HEART: regular rate & rhythm and no murmurs and no lower extremity edema ABDOMEN: abdomen soft, non-tender and normal bowel sounds MUSCULOSKELETAL: no cyanosis of digits and no clubbing  NEURO: alert & oriented x 3 with fluent speech, no focal motor/sensory deficits EXTREMITIES: No lower extremity edema   LABORATORY DATA:  I have reviewed the data as listed CMP Latest Ref Rng & Units 04/06/2019 02/02/2019 12/02/2018  Glucose 65 - 99 mg/dL 101(H) 146(H) 89  BUN 6 - 24 mg/dL _0 Creatinine 0.57 - 1.00 mg/dL 0.75 0.69 0.76  Sodium 134 - 144 mmol/L 143 136 136  Potassium 3.5 - 5.2  mmol/L 4.8 4.2 3.7  Chloride 96 - 106 mmol/L 102 100 102  CO2 20 - 29 mmol/L _0 Calcium 8.7 - 10.2 mg/dL 9.7 9.7 9.1  Total Protein 6.0 - 8.5 g/dL 7.5 - -  Total Bilirubin 0.0 - 1.2 mg/dL 0.3 - -  Alkaline Phos 39 - 117 IU/L 101 - -  AST 0 - 40 IU/L 18 - -  ALT 0 - 32 IU/L 22 - -    Lab Results  Component Value Date   WBC 10.5 12/02/2018   HGB 11.4 (L) 12/02/2018   HCT 36.0 12/02/2018   MCV 97.8 12/02/2018   PLT 283 12/02/2018   NEUTROABS 6.7 12/02/2018    ASSESSMENT & PLAN:  Malignant neoplasm of lower-outer quadrant of left breast of female, estrogen  receptor negative (HCC) 05/20/2017:Left lumpectomy: IDC grade 3, 1.2 cm, DCIS is present, margins negative, 0/6 lymph nodes negative, ER 0%, PR 0%, HER-2 positive ratio 2.28, Ki-67 20%, T1 BN 0 stage IA Taxol/Herceptin completed on 09/29/2017, went on to receive every 3 week adjuvant Herceptin on 10/20/2017 completed 06/28/2018  BRCA2 mutation: Patient decided not to undergo bilateral mastectomies. 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  The patient has a good understanding of the overall plan. she agrees with it. she will call with any problems that may develop before the next visit here.  [name], MD 05/02/2019  Julious Oka Dorshimer am acting as scribe for [name].  I have reviewed the above documentation for accuracy and completeness, and I agree with the above.

## 2019-05-02 ENCOUNTER — Telehealth: Payer: Self-pay | Admitting: Family Medicine

## 2019-05-02 ENCOUNTER — Other Ambulatory Visit: Payer: Self-pay

## 2019-05-02 ENCOUNTER — Inpatient Hospital Stay: Payer: Medicaid Other | Attending: Hematology and Oncology | Admitting: Hematology and Oncology

## 2019-05-02 VITALS — BP 115/61 | HR 82 | Temp 98.5°F | Resp 16 | Ht 61.0 in | Wt 164.0 lb

## 2019-05-02 DIAGNOSIS — Z79899 Other long term (current) drug therapy: Secondary | ICD-10-CM | POA: Diagnosis not present

## 2019-05-02 DIAGNOSIS — C5700 Malignant neoplasm of unspecified fallopian tube: Secondary | ICD-10-CM | POA: Diagnosis present

## 2019-05-02 DIAGNOSIS — Z9221 Personal history of antineoplastic chemotherapy: Secondary | ICD-10-CM | POA: Insufficient documentation

## 2019-05-02 DIAGNOSIS — C50512 Malignant neoplasm of lower-outer quadrant of left female breast: Secondary | ICD-10-CM | POA: Diagnosis present

## 2019-05-02 DIAGNOSIS — Z171 Estrogen receptor negative status [ER-]: Secondary | ICD-10-CM | POA: Diagnosis not present

## 2019-05-02 DIAGNOSIS — Z7984 Long term (current) use of oral hypoglycemic drugs: Secondary | ICD-10-CM | POA: Insufficient documentation

## 2019-05-02 DIAGNOSIS — Z1231 Encounter for screening mammogram for malignant neoplasm of breast: Secondary | ICD-10-CM | POA: Diagnosis not present

## 2019-05-02 DIAGNOSIS — Z923 Personal history of irradiation: Secondary | ICD-10-CM | POA: Insufficient documentation

## 2019-05-02 DIAGNOSIS — C5701 Malignant neoplasm of right fallopian tube: Secondary | ICD-10-CM | POA: Diagnosis not present

## 2019-05-02 NOTE — Telephone Encounter (Signed)
New Message   Pt is calling, states she is having issues taking her metformin, it is making her very sleepy. Please f/u

## 2019-05-02 NOTE — Telephone Encounter (Signed)
Please ask patient if it is possible for her to take her metformin before bedtime? Instead of taking it twice per day she can take it at night close to bedtime

## 2019-05-03 ENCOUNTER — Telehealth: Payer: Self-pay | Admitting: Hematology and Oncology

## 2019-05-03 NOTE — Telephone Encounter (Signed)
LMOM

## 2019-05-03 NOTE — Telephone Encounter (Signed)
A message was left regarding schedule for 38mo

## 2019-05-12 DIAGNOSIS — H2513 Age-related nuclear cataract, bilateral: Secondary | ICD-10-CM | POA: Diagnosis not present

## 2019-05-12 DIAGNOSIS — H00021 Hordeolum internum right upper eyelid: Secondary | ICD-10-CM | POA: Diagnosis not present

## 2019-05-12 DIAGNOSIS — H40023 Open angle with borderline findings, high risk, bilateral: Secondary | ICD-10-CM | POA: Diagnosis not present

## 2019-05-12 DIAGNOSIS — H1045 Other chronic allergic conjunctivitis: Secondary | ICD-10-CM | POA: Diagnosis not present

## 2019-05-12 NOTE — Telephone Encounter (Signed)
Informed patient with what provider stated and she verbalized understanding.  

## 2019-05-16 ENCOUNTER — Telehealth: Payer: Self-pay | Admitting: Family Medicine

## 2019-05-16 NOTE — Telephone Encounter (Signed)
No cough medicine on file. Will defer to PCP.   Dr. Chapman Fitch -   I'm not sure what medication patient is requesting. The front may need to contact her for appointment.

## 2019-05-16 NOTE — Telephone Encounter (Signed)
1) Medication(s) Requested (by name): Cough medication 2) Pharmacy of Choice: [id number] - [place], [place] - [address]

## 2019-05-17 ENCOUNTER — Other Ambulatory Visit: Payer: Self-pay | Admitting: Family Medicine

## 2019-05-17 DIAGNOSIS — R059 Cough, unspecified: Secondary | ICD-10-CM

## 2019-05-17 DIAGNOSIS — R05 Cough: Secondary | ICD-10-CM

## 2019-05-17 MED ORDER — GUAIFENESIN-CODEINE 100-10 MG/5ML PO SOLN: 5.0000 mL | Freq: Three times a day (TID) | ORAL | 0 refills | Status: DC | PRN

## 2019-05-17 NOTE — Telephone Encounter (Signed)
Informed patient and she verbalized understanding.  

## 2019-05-17 NOTE — Progress Notes (Signed)
Patient ID: Erin [name], female   DOB: 1960-09-24, 58 y.o.   MRN: [id number]   Phone message left that patient would like cough medication. No further information available. RX sent to pharmacy

## 2019-05-17 NOTE — Telephone Encounter (Signed)
Notify patient that RX was sent into her pharmacy for cough medication

## 2019-06-05 ENCOUNTER — Ambulatory Visit: Payer: Medicaid Other | Admitting: Family Medicine

## 2019-06-05 DIAGNOSIS — H524 Presbyopia: Secondary | ICD-10-CM | POA: Diagnosis not present

## 2019-06-07 ENCOUNTER — Encounter: Payer: Medicare Other | Admitting: Neurology

## 2019-06-07 DIAGNOSIS — Z23 Encounter for immunization: Secondary | ICD-10-CM | POA: Diagnosis not present

## 2019-06-09 ENCOUNTER — Inpatient Hospital Stay (HOSPITAL_BASED_OUTPATIENT_CLINIC_OR_DEPARTMENT_OTHER): Payer: Medicaid Other | Admitting: Gynecologic Oncology

## 2019-06-09 ENCOUNTER — Encounter: Payer: Self-pay | Admitting: Gynecologic Oncology

## 2019-06-09 ENCOUNTER — Inpatient Hospital Stay: Payer: Medicaid Other | Attending: Hematology and Oncology

## 2019-06-09 ENCOUNTER — Other Ambulatory Visit: Payer: Self-pay

## 2019-06-09 VITALS — BP 111/73 | HR 99 | Temp 98.5°F | Resp 18 | Ht 61.0 in | Wt 161.0 lb

## 2019-06-09 DIAGNOSIS — Z9221 Personal history of antineoplastic chemotherapy: Secondary | ICD-10-CM | POA: Insufficient documentation

## 2019-06-09 DIAGNOSIS — Z803 Family history of malignant neoplasm of breast: Secondary | ICD-10-CM | POA: Diagnosis not present

## 2019-06-09 DIAGNOSIS — C5701 Malignant neoplasm of right fallopian tube: Secondary | ICD-10-CM | POA: Diagnosis not present

## 2019-06-09 DIAGNOSIS — C50512 Malignant neoplasm of lower-outer quadrant of left female breast: Secondary | ICD-10-CM | POA: Insufficient documentation

## 2019-06-09 DIAGNOSIS — Z90710 Acquired absence of both cervix and uterus: Secondary | ICD-10-CM | POA: Insufficient documentation

## 2019-06-09 DIAGNOSIS — Z7984 Long term (current) use of oral hypoglycemic drugs: Secondary | ICD-10-CM | POA: Diagnosis not present

## 2019-06-09 DIAGNOSIS — Z800 Family history of malignant neoplasm of digestive organs: Secondary | ICD-10-CM | POA: Insufficient documentation

## 2019-06-09 DIAGNOSIS — I10 Essential (primary) hypertension: Secondary | ICD-10-CM | POA: Insufficient documentation

## 2019-06-09 DIAGNOSIS — Z9079 Acquired absence of other genital organ(s): Secondary | ICD-10-CM | POA: Diagnosis not present

## 2019-06-09 DIAGNOSIS — C5700 Malignant neoplasm of unspecified fallopian tube: Secondary | ICD-10-CM | POA: Diagnosis present

## 2019-06-09 DIAGNOSIS — Z79899 Other long term (current) drug therapy: Secondary | ICD-10-CM | POA: Insufficient documentation

## 2019-06-09 DIAGNOSIS — Z923 Personal history of irradiation: Secondary | ICD-10-CM | POA: Diagnosis not present

## 2019-06-09 DIAGNOSIS — Z90722 Acquired absence of ovaries, bilateral: Secondary | ICD-10-CM

## 2019-06-09 NOTE — Progress Notes (Signed)
Follow-up Note: Gyn-Onc  Consult was initially requested by Dr. Kennon Rounds for the evaluation of Erin [name] 58 y.o. female  CC:  Chief Complaint  Patient presents with  . fallopian tube cancer    Assessment/Plan:  Erin [name]  is a 58 y.o.  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   S/p 6 cycles adjuvant carboplatin/taxol chemotherapy between April 2019-October, 2019.   Complete clinical response.   Maintenance olaparib/parp inhibitor not tested in this early stage population, therefore recommending transitioning to surveillance care with 3 monthly evaluations.  Will check today's CA 125 and repeat in 3 months with exam.   HPI: Erin [name] is a 58 year old woman who is deaf and is seen in consultation at the request of Dr Kennon Rounds for clinical stage IC fallopian tube cancer.   The patient has a history of ER PR negative HER-2 positive breast cancer on the left side which was diagnosed in August 2018.  It was treated with lumpectomy in September 2018 followed by Adriamycin and Taxol chemotherapy.  She was then placed on Herceptin with a plan to commence radiation therapy in March 2019.  She reported menorrhagia with irregular menses and therefore transvaginal ultrasound was performed on June 29, 2017.  It showed a uterus measuring 11.8 x 5.2 x 6.3 cm with a septated fluid-filled structure adjacent to the dome of the uterus.  The endometrium was thickened at 10.2 mm the right ovary measured 3.2 x 2.1 x 2.7 cm and the left ovary measured 9.8 x 7.8 x 11.8 cm.  The ovarian tissue is largely occupied by a complex cyst measuring 7.5 x 9.9 x 9.6 cm with a prominent septation.  There was no free fluid identified.  To follow-up with this ultrasound Dr. Kennon Rounds obtained an MRI of the pelvis on September 06, 2017 which revealed a uterus of  normal size measuring 7.5 x 5 x 4.5 cm.  There is a bilobed cystic lesion presumably originating from the left adnexa which is different from the ultrasound report of a right adnexal complex cyst.  The cystic complex measures 12.8 x 10.9 x 8 cm.  The there was no nodularity.  The right ovary appeared normal.  It had no worrisome features.  Ca1 25 was drawn which was normal.  The patient was taken to the operating room on November 04, 2017 for laparoscopic assisted vaginal hysterectomy with BSO.  Surgical findings were significant for an 8-week size uterus with normal adnexa on the left and the right adnexal mass measured personally 12 cm.  It felt to be most consistent with a paratubal cyst.  There is normal upper abdomen reported washings were not obtained.  Surgery was uncomplicated.  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  The remainder of the entire right fallopian tube was submitted and no additional malignancy was identified.  The the associated ovarian cyst on the right was benign the left adnexa was unremarkable.  The uterus was also normal including the endometrium.  The patient is healed well from her surgery with no complaints.  She has a paternal grandmother who had colon cancer, a maternal half aunt with history of breast cancer, 2 paternal aunts with breast cancer.  There  is no family history of ovarian cancer.  She has had 2 prior NSVDs in the past.  She is otherwise fairly healthy.  She underwent genetic testing in June, 2019 which revealed a deleterious mutation in BRCA 2.   She received 6 cycles of carboplatin and paclitaxel chemotherapy between April, 2019 and October 2019.  She is on Herceptin maintenance therapy for her breast cancer.   She had not had CA 125 checked postop including throughout chemotherapy. CA 125 was drawn as surveillance on 02/27/19 and was  31.  Interval Hx:   CA 125 was drawn on 06/09/19 and was pending.   She has no symptoms concerning for recurrence.   Current Meds:  Outpatient Encounter Medications as of 06/09/2019  Medication Sig  . Accu-Chek FastClix Lancets MISC Use as instructed. Inject into the skin twice daily  . atorvastatin (LIPITOR) 40 MG tablet Take 1 tablet (40 mg total) by mouth daily.  . Blood Glucose Calibration (ACCU-CHEK GUIDE CONTROL) LIQD 1 each by In Vitro route once as needed for up to 1 dose.  . cholecalciferol (VITAMIN D) 1000 units tablet Take 1,000 Units by mouth daily.  . clindamycin (CLINDAGEL) 1 % gel Apply topically 2 (two) times daily.  Marland Kitchen gabapentin (NEURONTIN) 100 MG capsule One or two capsules at bedtime as needed for thumb pain  . glimepiride (AMARYL) 2 MG tablet Take 1 tablet (2 mg total) by mouth daily with breakfast. To lower blood sugar  . glucose blood (ACCU-CHEK GUIDE) test strip Use as instructed. Check blood glucose by fingerstick twice per day.  Elmore Guise Devices (ACCU-CHEK SOFTCLIX) lancets Use as instructed  . lisinopril (ZESTRIL) 10 MG tablet Take 1 tablet (10 mg total) by mouth daily. To lower blood pressure  . metFORMIN (GLUCOPHAGE XR) 500 MG 24 hr tablet Take 2 tablets (1,000 mg total) by mouth daily with breakfast.  . ferrous sulfate 325 (65 FE) MG tablet Take 1 tablet (325 mg total) by mouth daily before breakfast.  . guaiFENesin-codeine 100-10 MG/5ML syrup Take 5 mLs by mouth 3 (three) times daily as needed for cough. (Patient not taking: Reported on 06/09/2019)   Facility-Administered Encounter Medications as of 06/09/2019  Medication  . 0.9 %  sodium chloride infusion    Allergy: No Known Allergies  Social Hx:   Social History   Socioeconomic History  . Marital status: Legally Separated    Spouse name: Not on file  . Number of children: Not on file  . Years of education: Not on file  . Highest education level: Not on file  Occupational History  . Not on file   Social Needs  . Financial resource strain: Not on file  . Food insecurity    Worry: Not on file    Inability: Not on file  . Transportation needs    Medical: Not on file    Non-medical: Not on file  Tobacco Use  . Smoking status: Never Smoker  . Smokeless tobacco: Never Used  Substance and Sexual Activity  . Alcohol use: No  . Drug use: No  . Sexual activity: Never    Birth control/protection: None  Lifestyle  . Physical activity    Days per week: Not on file    Minutes per session: Not on file  . Stress: Not on file  Relationships  . Social Herbalist on phone: Not on file    Gets together: Not on file    Attends religious service: Not on file  Active member of club or organization: Not on file    Attends meetings of clubs or organizations: Not on file    Relationship status: Not on file  . Intimate partner violence    Fear of current or ex partner: Not on file    Emotionally abused: Not on file    Physically abused: Not on file    Forced sexual activity: Not on file  Other Topics Concern  . Not on file  Social History Narrative   1 boy and 1 girl   Resides in [place]   Seperated    Past Surgical Hx:  Past Surgical History:  Procedure Laterality Date  . BREAST LUMPECTOMY Left 06/10/2007  . BREAST LUMPECTOMY WITH RADIOACTIVE SEED AND SENTINEL LYMPH NODE BIOPSY Left 06/09/2017   Procedure: BREAST LUMPECTOMY WITH RADIOACTIVE SEED AND SENTINEL LYMPH NODE BIOPSY;  Surgeon: Excell Seltzer, MD;  Location: [facility];  Service: General;  Laterality: Left;  . COLONOSCOPY  11/02/2017   polyps  . LAPAROSCOPIC ASSISTED VAGINAL HYSTERECTOMY N/A 11/04/2017   Procedure: LAPAROSCOPIC ASSISTED VAGINAL HYSTERECTOMY;  Surgeon: [name], MD;  Location: [facility];  Service: Gynecology;  Laterality: N/A;  . LAPAROSCOPIC BILATERAL SALPINGO OOPHERECTOMY Bilateral 11/04/2017   Procedure: LAPAROSCOPIC BILATERAL SALPINGO OOPHORECTOMY;  Surgeon: [name], MD;  Location: [facility];  Service: Gynecology;  Laterality: Bilateral;  . MULTIPLE TOOTH EXTRACTIONS    . PORTACATH PLACEMENT Right 06/09/2017   Procedure: INSERTION PORT-A-CATH WITH [place];  Surgeon: Excell Seltzer, MD;  Location: [facility];  Service: General;  Laterality: Right;  . TUBAL LIGATION  02/04/2002  . UPPER GI ENDOSCOPY      Past Medical Hx:  Past Medical History:  Diagnosis Date  . Anemia   . Arthritis    knees, elbows  . BRCA2 gene mutation positive 02/17/2018   BRCA2 c.4552del (p.Glu1518Asnfs*25)  Result reported out on 02/15/2018.   . Breast cancer ([place]) 05/2017   left  . Cough 06/03/2017  . Deaf   . Depression   . Dyspnea    states some SOB with ADLs and aanemia   . Family history of breast cancer   . Family history of breast cancer   . Family history of colon cancer   . Genetic testing 02/17/2018   The Common Hereditary Cancer Panel offered by [facility] includes sequencing and/or deletion duplication testing of the following 47 genes: APC, ATM, AXIN2, BARD1, BMPR1A, BRCA1, BRCA2, BRIP1, CDH1, CDKN2A (p14ARF), CDKN2A (p16INK4a), CKD4, CHEK2, CTNNA1, DICER1, EPCAM (Deletion/duplication testing only), GREM1 (promoter region deletion/duplication testing only), KIT, MEN1, MLH1, MSH2, MSH3, MSH6, MU  . High cholesterol   . History of blood transfusion 07/2017   [facility]  . Hypertension   . Non-insulin dependent type 2 diabetes mellitus ([place])   . Runny nose 06/03/2017   clear drainage, per pt.  . SVD (spontaneous vaginal delivery)    x 2    Past Gynecological History:  SVDx 2, s/p hysterectomy Patient's last menstrual period was 07/09/2017 (lmp unknown).  Family Hx:  Family History  Problem Relation Age of Onset  . Hypertension Mother   . Diabetes Father   . Hypertension Sister   . Diabetes Paternal Grandmother   . Colon cancer Paternal Grandmother 81  . CAD Brother   . Diabetes Brother   . Breast cancer Maternal Aunt        dx >50  . Heart  attack Paternal Grandfather   . Breast cancer Maternal Aunt  dx under 50  . Breast cancer Maternal Aunt        dx  under 50    Review of Systems:  Constitutional  Feels well,    ENT Normal appearing ears and nares bilaterally Skin/Breast  No rash, sores, jaundice, itching, dryness Cardiovascular  No chest pain, shortness of breath, or edema  Pulmonary  No cough or wheeze.  Gastro Intestinal  No nausea, vomitting, or diarrhoea. No bright red blood per rectum, no abdominal pain, change in bowel movement, or constipation.  Genito Urinary  No frequency, urgency, dysuria,  Musculo Skeletal  No myalgia, arthralgia, joint swelling or pain  Neurologic  No weakness, numbness, change in gait,  Psychology  No depression, anxiety, insomnia.   Vitals:  Blood pressure 111/73, pulse 99, temperature 98.5 F (36.9 C), temperature source Temporal, resp. rate 18, height 5' 1"  (1.549 m), weight 161 lb (73 kg), last menstrual period 07/09/2017, SpO2 98 %.  Physical Exam: WD in NAD Neck  Supple NROM, without any enlargements.  Lymph Node Survey No cervical supraclavicular or inguinal adenopathy Cardiovascular  Pulse normal rate, regularity and rhythm. S1 and S2 normal.  Lungs  Clear to auscultation bilateraly, without wheezes/crackles/rhonchi. Good air movement.  Skin  No rash/lesions/breakdown  Psychiatry  Alert and oriented to person, place, and time  Abdomen  Normoactive bowel sounds, abdomen soft, non-tender and obese without evidence of hernia. Soft incisions. No masses. Back No CVA tenderness Genito Urinary  No palpable masses in vagina or pelvis. Rectal  deferred Extremities  No bilateral cyanosis, clubbing or edema.   Erin Solo, MD  06/09/2019, 1:50 PM

## 2019-06-09 NOTE — Patient Instructions (Signed)
Dr Serita Grit office will contact you with your CA 125 results.  Please return to see Dr Denman George in 3 months.  Please notify Dr Denman George at phone number [phone] if you notice vaginal bleeding, new pelvic or abdominal pains, bloating, feeling full easy, or a change in bladder or bowel function.

## 2019-06-10 ENCOUNTER — Other Ambulatory Visit: Payer: Self-pay | Admitting: Gynecologic Oncology

## 2019-06-10 DIAGNOSIS — R971 Elevated cancer antigen 125 [CA 125]: Secondary | ICD-10-CM

## 2019-06-10 DIAGNOSIS — C5701 Malignant neoplasm of right fallopian tube: Secondary | ICD-10-CM

## 2019-06-10 LAB — CA 125: Cancer Antigen (CA) 125: 218 U/mL — ABNORMAL HIGH (ref 0.0–38.1)

## 2019-06-10 NOTE — Progress Notes (Unsigned)
Elevated CA 125 Will check CT to evaluate for measurable disease.  Would then refer back to Dr Alvy Bimler for salvage chemotherapy.  Thereasa Solo, MD

## 2019-06-12 ENCOUNTER — Telehealth: Payer: Self-pay

## 2019-06-12 DIAGNOSIS — C5701 Malignant neoplasm of right fallopian tube: Secondary | ICD-10-CM

## 2019-06-12 NOTE — Telephone Encounter (Signed)
ENCOUNTER OPENED IN ERROR

## 2019-06-12 NOTE — Telephone Encounter (Signed)
-----   Message from [name], MD sent at 06/10/2019  9:34 AM EDT ----- Regarding: elevated ca 125 Can we call her on her video phone and let her know that her tumor marker is elevated and so we will check a CT scan for signs of cancer.  I'll put the order in.  Terrence Dupont ----- Message ----- From: Buel Ream, Lab In [place] Sent: 06/10/2019   3:36 AM EDT To: [name], MD

## 2019-06-12 NOTE — Telephone Encounter (Signed)
Told Erin [name] the results of the CA-125 as noted below by Dr. Denman George. Pt to have CT Abdomen and pelvis at [facility] at 3 pm on Thursday 06-15-19. She is NPO after 1100 06-15-19. She will drink 1 bottle of contrast at 1 pm and the second bottle at 2 pm.  She can refrigerate contrast. There will be the contrast and written instructions in the lab tomorrow for her to pick up. Pt for B-met tomorrow at 0915. We will call her with the results of the scan when available. Pt stated that she understood.

## 2019-06-13 ENCOUNTER — Other Ambulatory Visit: Payer: Self-pay

## 2019-06-13 ENCOUNTER — Inpatient Hospital Stay: Payer: Medicaid Other

## 2019-06-13 DIAGNOSIS — C5701 Malignant neoplasm of right fallopian tube: Secondary | ICD-10-CM | POA: Diagnosis not present

## 2019-06-13 LAB — BASIC METABOLIC PANEL - CANCER CENTER ONLY
Anion gap: 10 (ref 5–15)
BUN: 6 mg/dL (ref 6–20)
CO2: 25 mmol/L (ref 22–32)
Calcium: 9.4 mg/dL (ref 8.9–10.3)
Chloride: 107 mmol/L (ref 98–111)
Creatinine: 0.73 mg/dL (ref 0.44–1.00)
GFR, Est AFR Am: >60 mL/min (ref >60)
GFR, Estimated: >60 mL/min (ref >60)
Glucose, Bld: 90 mg/dL (ref 70–99)
Potassium: 4 mmol/L (ref 3.5–5.1)
Sodium: 142 mmol/L (ref 135–145)

## 2019-06-15 ENCOUNTER — Ambulatory Visit (HOSPITAL_COMMUNITY): Admission: RE | Admit: 2019-06-15 | Payer: Medicaid Other | Source: Ambulatory Visit

## 2019-06-22 ENCOUNTER — Other Ambulatory Visit: Payer: Self-pay

## 2019-06-22 ENCOUNTER — Ambulatory Visit (HOSPITAL_COMMUNITY)
Admission: RE | Admit: 2019-06-22 | Discharge: 2019-06-22 | Disposition: A | Discharge status: Home / Self Care | Payer: Medicaid Other | Source: Ambulatory Visit | Attending: Gynecologic Oncology | Admitting: Gynecologic Oncology

## 2019-06-22 DIAGNOSIS — C5701 Malignant neoplasm of right fallopian tube: Secondary | ICD-10-CM

## 2019-06-22 DIAGNOSIS — R971 Elevated cancer antigen 125 [CA 125]: Secondary | ICD-10-CM | POA: Diagnosis present

## 2019-06-22 MED ORDER — SODIUM CHLORIDE (PF) 0.9 % IJ SOLN
INTRAMUSCULAR | Status: AC
  Filled 2019-06-22: qty 50

## 2019-06-22 MED ORDER — IOHEXOL 300 MG/ML  SOLN
100.0000 mL | Freq: Once | INTRAMUSCULAR | Status: AC | PRN
  Administered 2019-06-22: 12:00:00 100 mL via INTRAVENOUS

## 2019-06-23 ENCOUNTER — Telehealth: Payer: Self-pay

## 2019-06-23 ENCOUNTER — Other Ambulatory Visit: Payer: Self-pay | Admitting: Family Medicine

## 2019-06-23 ENCOUNTER — Telehealth: Payer: Self-pay | Admitting: Family Medicine

## 2019-06-23 DIAGNOSIS — E1169 Type 2 diabetes mellitus with other specified complication: Secondary | ICD-10-CM

## 2019-06-23 DIAGNOSIS — E785 Hyperlipidemia, unspecified: Secondary | ICD-10-CM

## 2019-06-23 DIAGNOSIS — D509 Iron deficiency anemia, unspecified: Secondary | ICD-10-CM

## 2019-06-23 MED ORDER — FERROUS SULFATE 325 (65 FE) MG PO TABS: 325.0000 mg | ORAL_TABLET | Freq: Every day | ORAL | 1 refills | Status: DC

## 2019-06-23 MED ORDER — ATORVASTATIN CALCIUM 40 MG PO TABS: 40.0000 mg | ORAL_TABLET | Freq: Every day | ORAL | 1 refills | Status: DC

## 2019-06-23 NOTE — Telephone Encounter (Signed)
Told Ms Sene that the CT Abdomen and Pelvis showed no evidence of metastatic disease. Dr. Denman George said that she needs to keep her 3 month follow up in December with labs as scheduled. She is to call if she experiences and bloating, abdominal pain, feeling full easy,or a change in bladder or bowel function. Pt. acknowledges that she understands information.

## 2019-06-23 NOTE — Telephone Encounter (Signed)
1) Medication(s) Requested (by name): Atorvastatin Ferrous sulfate 2) Pharmacy of Choice:  [id number] - [place], [place] - [address]

## 2019-06-23 NOTE — Progress Notes (Signed)
Patient ID: Erin [name], female   DOB: 03-11-1961, 58 y.o.   MRN: [id number]   Phone message left regarding refill of atorvastatin and ferrous sulfate and RX's were sent to patient's pharmacy

## 2019-06-23 NOTE — Telephone Encounter (Signed)
Notify patient that refills were sent to pharmacy

## 2019-06-23 NOTE — Telephone Encounter (Signed)
LMOM with what provider stated.

## 2019-07-19 ENCOUNTER — Other Ambulatory Visit: Payer: Self-pay

## 2019-07-19 ENCOUNTER — Ambulatory Visit: Payer: Medicare Other | Attending: Family Medicine | Admitting: Physician Assistant

## 2019-07-19 ENCOUNTER — Encounter: Payer: Self-pay | Admitting: Hematology and Oncology

## 2019-07-19 VITALS — BP 121/79 | HR 76 | Temp 98.2°F | Ht 61.0 in | Wt 159.8 lb

## 2019-07-19 DIAGNOSIS — I10 Essential (primary) hypertension: Secondary | ICD-10-CM | POA: Diagnosis not present

## 2019-07-19 DIAGNOSIS — M545 Low back pain, unspecified: Secondary | ICD-10-CM

## 2019-07-19 DIAGNOSIS — E1165 Type 2 diabetes mellitus with hyperglycemia: Secondary | ICD-10-CM | POA: Insufficient documentation

## 2019-07-19 DIAGNOSIS — H9193 Unspecified hearing loss, bilateral: Secondary | ICD-10-CM

## 2019-07-19 DIAGNOSIS — M79605 Pain in left leg: Secondary | ICD-10-CM | POA: Insufficient documentation

## 2019-07-19 DIAGNOSIS — Z791 Long term (current) use of non-steroidal anti-inflammatories (NSAID): Secondary | ICD-10-CM | POA: Diagnosis not present

## 2019-07-19 DIAGNOSIS — Z7901 Long term (current) use of anticoagulants: Secondary | ICD-10-CM | POA: Diagnosis not present

## 2019-07-19 DIAGNOSIS — Z79899 Other long term (current) drug therapy: Secondary | ICD-10-CM | POA: Insufficient documentation

## 2019-07-19 DIAGNOSIS — E118 Type 2 diabetes mellitus with unspecified complications: Secondary | ICD-10-CM

## 2019-07-19 DIAGNOSIS — Z7984 Long term (current) use of oral hypoglycemic drugs: Secondary | ICD-10-CM | POA: Diagnosis not present

## 2019-07-19 DIAGNOSIS — M1990 Unspecified osteoarthritis, unspecified site: Secondary | ICD-10-CM | POA: Insufficient documentation

## 2019-07-19 DIAGNOSIS — IMO0002 Reserved for concepts with insufficient information to code with codable children: Secondary | ICD-10-CM

## 2019-07-19 LAB — POCT GLYCOSYLATED HEMOGLOBIN (HGB A1C): Hemoglobin A1C: 6.3 % — AB (ref 4.0–5.6)

## 2019-07-19 LAB — GLUCOSE, POCT (MANUAL RESULT ENTRY): POC Glucose: 93 mg/dl (ref 70–99)

## 2019-07-19 MED ORDER — MELOXICAM 15 MG PO TABS: 15.0000 mg | ORAL_TABLET | Freq: Every day | ORAL | 0 refills | Status: DC

## 2019-07-19 MED ORDER — METHOCARBAMOL 500 MG PO TABS: 1000.0000 mg | ORAL_TABLET | Freq: Three times a day (TID) | ORAL | 0 refills | Status: DC | PRN

## 2019-07-19 NOTE — Progress Notes (Signed)
Patient ID: Erin [name], female   DOB: 1961/02/09, 58 y.o.   MRN: [id number]   Erin [name], is a 58 y.o. female  [id number]  [id number]  DOB - Jan 29, 1961  Subjective:  Chief Complaint and HPI: Erin [name] is a 58 y.o. female here today for lower back pain for about 3-4 months.  Seems to be progressively worse.  Has ASL translator.  Sometimes pain radiates into L leg.  Pain is worse when she has been inactive or is just getting out of bed.  No urinary s/sx.  No definite injury but seems to have been exacerbated when she was moving several months ago.  She wasn't able to take NSAIDS during CA treatments about 2 years ago.  She isn't sure if she can take these now.    ROS:   Constitutional:  No f/c, No night sweats, No unexplained weight loss. EENT:  No vision changes, No blurry vision, No hearing changes. No mouth, throat, or ear problems.  Respiratory: No cough, No SOB Cardiac: No CP, no palpitations GI:  No abd pain, No N/V/D. GU: No Urinary s/sx Musculoskeletal: back pain Neuro: No headache, no dizziness, no motor weakness.  Skin: No rash Endocrine:  No polydipsia. No polyuria.  Psych: Denies SI/HI  No problems updated.  ALLERGIES: No Known Allergies  PAST MEDICAL HISTORY: Past Medical History:  Diagnosis Date  . Anemia   . Arthritis    knees, elbows  . BRCA2 gene mutation positive 02/17/2018   BRCA2 c.4552del (p.Glu1518Asnfs*25)  Result reported out on 02/15/2018.   . Breast cancer ([place]) 05/2017   left  . Cough 06/03/2017  . Deaf   . Depression   . Dyspnea    states some SOB with ADLs and aanemia   . Family history of breast cancer   . Family history of breast cancer   . Family history of colon cancer   . Genetic testing 02/17/2018   The Common Hereditary Cancer Panel offered by [facility] includes sequencing and/or deletion duplication testing of the following 47 genes: APC, ATM, AXIN2, BARD1, BMPR1A, BRCA1, BRCA2, BRIP1, CDH1, CDKN2A (p14ARF), CDKN2A  (p16INK4a), CKD4, CHEK2, CTNNA1, DICER1, EPCAM (Deletion/duplication testing only), GREM1 (promoter region deletion/duplication testing only), KIT, MEN1, MLH1, MSH2, MSH3, MSH6, MU  . High cholesterol   . History of blood transfusion 07/2017   [facility]  . Hypertension   . Non-insulin dependent type 2 diabetes mellitus ([place])   . Runny nose 06/03/2017   clear drainage, per pt.  . SVD (spontaneous vaginal delivery)    x 2    MEDICATIONS AT HOME: Prior to Admission medications   Medication Sig Start Date End Date Taking? Authorizing Provider  Accu-Chek FastClix Lancets MISC Use as instructed. Inject into the skin twice daily 12/26/18  Yes [name], NP  atorvastatin (LIPITOR) 40 MG tablet Take 1 tablet (40 mg total) by mouth daily. 06/23/19  Yes Fulp, Cammie, MD  Blood Glucose Calibration (ACCU-CHEK GUIDE CONTROL) LIQD 1 each by In Vitro route once as needed for up to 1 dose. 12/26/18  Yes [name], NP  cholecalciferol (VITAMIN D) 1000 units tablet Take 1,000 Units by mouth daily.   Yes [provider]  clindamycin (CLINDAGEL) 1 % gel Apply topically 2 (two) times daily. 06/28/18  Yes [name], MD  ferrous sulfate 325 (65 FE) MG tablet Take 1 tablet (325 mg total) by mouth daily before breakfast. 06/23/19 09/21/19 Yes Fulp, Cammie, MD  gabapentin (NEURONTIN) 100 MG capsule One or two  capsules at bedtime as needed for thumb pain 04/06/19  Yes Fulp, Cammie, MD  glimepiride (AMARYL) 2 MG tablet Take 1 tablet (2 mg total) by mouth daily with breakfast. To lower blood sugar 02/02/19  Yes Fulp, Cammie, MD  glucose blood (ACCU-CHEK GUIDE) test strip Use as instructed. Check blood glucose by fingerstick twice per day. 12/26/18  Yes [name], NP  Lancet Devices [facility]) lancets Use as instructed 12/17/14  Yes Lance Bosch, NP  lisinopril (ZESTRIL) 10 MG tablet Take 1 tablet (10 mg total) by mouth daily. To lower blood pressure 02/02/19  Yes Fulp, Cammie, MD  metFORMIN  (GLUCOPHAGE XR) 500 MG 24 hr tablet Take 2 tablets (1,000 mg total) by mouth daily with breakfast. 02/03/19  Yes Fulp, Cammie, MD  guaiFENesin-codeine 100-10 MG/5ML syrup Take 5 mLs by mouth 3 (three) times daily as needed for cough. Patient not taking: Reported on 07/19/2019 05/17/19   Fulp, Ander Gaster, MD  meloxicam (MOBIC) 15 MG tablet Take 1 tablet (15 mg total) by mouth daily. Prn pain 07/19/19   Argentina Donovan, PA-C  methocarbamol (ROBAXIN) 500 MG tablet Take 2 tablets (1,000 mg total) by mouth every 8 (eight) hours as needed for muscle spasms. 07/19/19   Argentina Donovan, PA-C     Objective:  EXAM:   Vitals:   07/19/19 1009  BP: 121/79  Pulse: 76  Temp: 98.2 F (36.8 C)  TempSrc: Oral  SpO2: 97%  Weight: 159 lb 12.8 oz (72.5 kg)  Height: _0  (1.549 m)    General appearance : A&OX3. NAD. Non-toxic-appearing HEENT: Atraumatic and Normocephalic.  PERRLA. EOM intact.  Chest/Lungs:  Breathing-non-labored, Good air entry bilaterally, breath sounds normal without rales, rhonchi, or wheezing  CVS: S1 S2 regular, no murmurs, gallops, rubs  Back:  paraspinus spasm in lower back.  Neg SLR.  DTR L>R LLE. Extremities: Bilateral Lower Ext shows no edema, both legs are warm to touch with = pulse throughout Neurology:  CN II-XII grossly intact, Non focal.   Psych:  TP linear. J/I WNL. Normal speech. Appropriate eye contact and affect.  Skin:  No Rash  Data Review Lab Results  Component Value Date   HGBA1C 6.3 (A) 07/19/2019   HGBA1C 8.4 (H) 02/02/2019   HGBA1C 6.6 (A) 05/30/2018     Assessment & Plan   1. Diabetes mellitus type 2, uncontrolled, with complications ([place]) Not at goal-continue current regimen and work on diabetic diet - Glucose (CBG) - HgB A1c  2. Low back pain without sciatica, unspecified back pain laterality, unspecified chronicity H/o CA-r/o lytic lesions - DG Lumbar Spine Complete; Future - meloxicam (MOBIC) 15 MG tablet; Take 1 tablet (15 mg total) by mouth  daily. Prn pain  Dispense: 30 tablet; Refill: 0 - methocarbamol (ROBAXIN) 500 MG tablet; Take 2 tablets (1,000 mg total) by mouth every 8 (eight) hours as needed for muscle spasms.  Dispense: 90 tablet; Refill: 0   Patient have been counseled extensively about nutrition and exercise  Return for november appt with Dr Chapman Fitch.  The patient was given clear instructions to go to ER or return to medical center if symptoms don't improve, worsen or new problems develop. The patient verbalized understanding. The patient was told to call to get lab results if they haven't heard anything in the next week.     Freeman Caldron, PA-C [facility] and [facility] [place], [place]   07/19/2019, 10:39 AM

## 2019-07-19 NOTE — Progress Notes (Signed)
Lower back pain 

## 2019-07-25 ENCOUNTER — Telehealth: Payer: Self-pay | Admitting: Neurology

## 2019-07-25 NOTE — Telephone Encounter (Signed)
I reached out to CAP at [phone] regarding rescheduling 11/11 [facility] d/t technician being out. I have rescheduled patient's appointment with CAP to 12/30 and CAP will confirm this appointment with patient.

## 2019-07-26 ENCOUNTER — Encounter: Payer: Medicare Other | Admitting: Neurology

## 2019-08-04 ENCOUNTER — Encounter: Payer: Self-pay | Admitting: Family Medicine

## 2019-08-04 ENCOUNTER — Ambulatory Visit (HOSPITAL_COMMUNITY)
Admission: RE | Admit: 2019-08-04 | Discharge: 2019-08-04 | Disposition: A | Discharge status: Home / Self Care | Payer: Medicaid Other | Source: Ambulatory Visit | Attending: Physician Assistant | Admitting: Physician Assistant

## 2019-08-04 ENCOUNTER — Other Ambulatory Visit: Payer: Self-pay

## 2019-08-04 ENCOUNTER — Ambulatory Visit (HOSPITAL_BASED_OUTPATIENT_CLINIC_OR_DEPARTMENT_OTHER): Payer: Medicaid Other | Admitting: Family Medicine

## 2019-08-04 VITALS — BP 118/71 | HR 83 | Temp 98.4°F | Resp 18 | Ht 64.0 in | Wt 159.0 lb

## 2019-08-04 DIAGNOSIS — M545 Low back pain, unspecified: Secondary | ICD-10-CM

## 2019-08-04 DIAGNOSIS — I10 Essential (primary) hypertension: Secondary | ICD-10-CM | POA: Diagnosis not present

## 2019-08-04 DIAGNOSIS — E119 Type 2 diabetes mellitus without complications: Secondary | ICD-10-CM

## 2019-08-04 MED ORDER — GLIMEPIRIDE 2 MG PO TABS: 2.0000 mg | ORAL_TABLET | Freq: Every day | ORAL | 3 refills | Status: DC

## 2019-08-04 MED ORDER — TRAMADOL HCL 50 MG PO TABS: 50.0000 mg | ORAL_TABLET | Freq: Four times a day (QID) | ORAL | 0 refills | Status: AC | PRN

## 2019-08-04 MED ORDER — PREDNISONE 20 MG PO TABS: ORAL_TABLET | ORAL | 0 refills | Status: DC

## 2019-08-04 MED ORDER — LISINOPRIL 10 MG PO TABS: 10.0000 mg | ORAL_TABLET | Freq: Every day | ORAL | 3 refills | Status: DC

## 2019-08-04 MED ORDER — GLIMEPIRIDE 2 MG PO TABS: 2.0000 mg | ORAL_TABLET | Freq: Every day | ORAL | 5 refills | Status: DC

## 2019-08-04 NOTE — Progress Notes (Signed)
Established Patient Office Visit  Subjective:  Patient ID: Erin [name], female    DOB: 1961-02-08  Age: 58 y.o. MRN: [id number]  CC:  Chief Complaint  Patient presents with  . Diabetes   Due to language barrier, [employer] video interpretation services used for interpreter for American sign language as patient is deaf  HPI Erin [name] presents for follow-up of diabetes and she is status post a recent visit here with another provider on 07/19/2019  for low back pain and she does not feel that the medication that she was prescribed helped with her pain. (Per last note, patient was prescribed Mobic 15 mg and Robaxin 500 mg and was asked to obtain lumbar spine film to rule out lytic lesions due to her history of breast cancer)  the medication did make her feel sleepy however. She has not yet gotten the xray of her lumbar spine that was ordered at her last visit but will go have the xray done after today's visit.  She now feels as if she has some sharp pain radiating into her buttocks and legs on either side which stops above the knees.  Current pain ranges from a 6-8 with activity such as walking or with prolonged standing.  Pain is sharp.         She has been taking Metformin as prescribed for her diabetes and denies any current issues with increased thirst or urinary frequency.  She believes that she could do better with her diet to help control her blood sugars.  Blood sugars are in the 140s to 160s fasting.  She also continues to take lisinopril for hypertension.  She denies headaches or dizziness related to her blood pressure.  She does need a refill of her blood pressure medication at today's visit.  Past Medical History:  Diagnosis Date  . Anemia   . Arthritis    knees, elbows  . BRCA2 gene mutation positive 02/17/2018   BRCA2 c.4552del (p.[id number]*25)  Result reported out on 02/15/2018.   . Breast cancer ([place]) 05/2017   left  . Cough 06/03/2017  . Deaf   . Depression   . Dyspnea     states some SOB with ADLs and aanemia   . Family history of breast cancer   . Family history of breast cancer   . Family history of colon cancer   . Genetic testing 02/17/2018   The Common Hereditary Cancer Panel offered by [facility] includes sequencing and/or deletion duplication testing of the following 47 genes: APC, ATM, AXIN2, BARD1, BMPR1A, BRCA1, BRCA2, BRIP1, CDH1, CDKN2A (p14ARF), CDKN2A (p16INK4a), CKD4, CHEK2, CTNNA1, DICER1, EPCAM (Deletion/duplication testing only), GREM1 (promoter region deletion/duplication testing only), KIT, MEN1, MLH1, MSH2, MSH3, MSH6, MU  . High cholesterol   . History of blood transfusion 07/2017   [facility]  . Hypertension   . Non-insulin dependent type 2 diabetes mellitus ([place])   . Runny nose 06/03/2017   clear drainage, per pt.  . SVD (spontaneous vaginal delivery)    x 2    Past Surgical History:  Procedure Laterality Date  . BREAST LUMPECTOMY Left 06/10/2007  . BREAST LUMPECTOMY WITH RADIOACTIVE SEED AND SENTINEL LYMPH NODE BIOPSY Left 06/09/2017   Procedure: BREAST LUMPECTOMY WITH RADIOACTIVE SEED AND SENTINEL LYMPH NODE BIOPSY;  Surgeon: Excell Seltzer, MD;  Location: [facility];  Service: General;  Laterality: Left;  . COLONOSCOPY  11/02/2017   polyps  . LAPAROSCOPIC ASSISTED VAGINAL HYSTERECTOMY N/A 11/04/2017   Procedure: LAPAROSCOPIC ASSISTED VAGINAL HYSTERECTOMY;  Surgeon: [name], MD;  Location: [facility];  Service: Gynecology;  Laterality: N/A;  . LAPAROSCOPIC BILATERAL SALPINGO OOPHERECTOMY Bilateral 11/04/2017   Procedure: LAPAROSCOPIC BILATERAL SALPINGO OOPHORECTOMY;  Surgeon: [name], MD;  Location: [facility];  Service: Gynecology;  Laterality: Bilateral;  . MULTIPLE TOOTH EXTRACTIONS    . PORTACATH PLACEMENT Right 06/09/2017   Procedure: INSERTION PORT-A-CATH WITH [place];  Surgeon: Excell Seltzer, MD;  Location: [facility];  Service: General;  Laterality: Right;  . TUBAL LIGATION  02/04/2002  .  UPPER GI ENDOSCOPY      Family History  Problem Relation Age of Onset  . Hypertension Mother   . Diabetes Father   . Hypertension Sister   . Diabetes Paternal Grandmother   . Colon cancer Paternal Grandmother 73  . CAD Brother   . Diabetes Brother   . Breast cancer Maternal Aunt        dx >50  . Heart attack Paternal Grandfather   . Breast cancer Maternal Aunt        dx under 47  . Breast cancer Maternal Aunt        dx  under 54    Social History   Socioeconomic History  . Marital status: Legally Separated    Spouse name: Not on file  . Number of children: Not on file  . Years of education: Not on file  . Highest education level: Not on file  Occupational History  . Not on file  Social Needs  . Financial resource strain: Not on file  . Food insecurity    Worry: Not on file    Inability: Not on file  . Transportation needs    Medical: Not on file    Non-medical: Not on file  Tobacco Use  . Smoking status: Never Smoker  . Smokeless tobacco: Never Used  Substance and Sexual Activity  . Alcohol use: No  . Drug use: No  . Sexual activity: Never    Birth control/protection: None  Lifestyle  . Physical activity    Days per week: Not on file    Minutes per session: Not on file  . Stress: Not on file  Relationships  . Social Herbalist on phone: Not on file    Gets together: Not on file    Attends religious service: Not on file    Active member of club or organization: Not on file    Attends meetings of clubs or organizations: Not on file    Relationship status: Not on file  . Intimate partner violence    Fear of current or ex partner: Not on file    Emotionally abused: Not on file    Physically abused: Not on file    Forced sexual activity: Not on file  Other Topics Concern  . Not on file  Social History Narrative   1 boy and 1 girl   Resides in [place]   Seperated    Outpatient Medications Prior to Visit  Medication Sig Dispense Refill    . Accu-Chek FastClix Lancets MISC Use as instructed. Inject into the skin twice daily 100 each 3  . atorvastatin (LIPITOR) 40 MG tablet Take 1 tablet (40 mg total) by mouth daily. 90 tablet 1  . Blood Glucose Calibration (ACCU-CHEK GUIDE CONTROL) LIQD 1 each by In Vitro route once as needed for up to 1 dose. 1 each 0  . cholecalciferol (VITAMIN D) 1000 units tablet Take 1,000 Units by mouth daily.    Marland Kitchen  ferrous sulfate 325 (65 FE) MG tablet Take 1 tablet (325 mg total) by mouth daily before breakfast. 90 tablet 1  . gabapentin (NEURONTIN) 100 MG capsule One or two capsules at bedtime as needed for thumb pain 60 capsule 6  . glimepiride (AMARYL) 2 MG tablet Take 1 tablet (2 mg total) by mouth daily with breakfast. To lower blood sugar 30 tablet 5  . glucose blood (ACCU-CHEK GUIDE) test strip Use as instructed. Check blood glucose by fingerstick twice per day. 100 each 12  . Lancet Devices (ACCU-CHEK SOFTCLIX) lancets Use as instructed 1 each 0  . lisinopril (ZESTRIL) 10 MG tablet Take 1 tablet (10 mg total) by mouth daily. To lower blood pressure 90 tablet 3  . meloxicam (MOBIC) 15 MG tablet Take 1 tablet (15 mg total) by mouth daily. Prn pain 30 tablet 0  . metFORMIN (GLUCOPHAGE XR) 500 MG 24 hr tablet Take 2 tablets (1,000 mg total) by mouth daily with breakfast. 180 tablet 0  . methocarbamol (ROBAXIN) 500 MG tablet Take 2 tablets (1,000 mg total) by mouth every 8 (eight) hours as needed for muscle spasms. 90 tablet 0  . clindamycin (CLINDAGEL) 1 % gel Apply topically 2 (two) times daily. 30 g 1  . guaiFENesin-codeine 100-10 MG/5ML syrup Take 5 mLs by mouth 3 (three) times daily as needed for cough. (Patient not taking: Reported on 07/19/2019) 120 mL 0   Facility-Administered Medications Prior to Visit  Medication Dose Route Frequency Provider Last Rate Last Dose  . 0.9 %  sodium chloride infusion  500 mL Intravenous Once Nandigam, Kavitha V, MD        No Known Allergies  ROS Review of  Systems  Constitutional: Positive for fatigue. Negative for chills and fever.  HENT: Positive for hearing loss. Negative for sore throat and trouble swallowing.   Eyes: Negative for photophobia and visual disturbance.  Respiratory: Negative for cough and shortness of breath.   Cardiovascular: Negative for chest pain and palpitations.  Gastrointestinal: Negative for abdominal pain, blood in stool, constipation, diarrhea and nausea.  Endocrine: Negative for polydipsia, polyphagia and polyuria.  Genitourinary: Negative for dysuria and frequency.  Musculoskeletal: Positive for back pain and gait problem.  Neurological: Negative for dizziness and headaches.  Hematological: Negative for adenopathy. Does not bruise/bleed easily.      Objective:    Physical Exam  Constitutional: She is oriented to person, place, and time. She appears well-developed and well-nourished.  Neck: No JVD present.  Cardiovascular: Normal rate and regular rhythm.  Pulmonary/Chest: Effort normal and breath sounds normal.  Abdominal: Soft. There is no abdominal tenderness. There is no rebound and no guarding.  Musculoskeletal:        General: Tenderness present. No edema.     Cervical back: Normal range of motion and neck supple.     Comments: Lumbosacral tenderness to palpation and positive bilateral seated leg raise  Lymphadenopathy:    She has no cervical adenopathy.  Neurological: She is alert and oriented to person, place, and time.  Skin: Skin is warm and dry.  Psychiatric: She has a normal mood and affect.  Nursing note and vitals reviewed.   BP 118/71 (BP Location: Right Arm, Patient Position: Sitting, Cuff Size: Normal)   Pulse 83   Temp 98.4 F (36.9 C) (Oral)   Resp 18   Ht _0  (1.626 m)   Wt 159 lb (72.1 kg)   LMP 07/09/2017 (LMP Unknown)   SpO2 99%   BMI 27.29 kg/m  Wt Readings from Last 3 Encounters:  08/04/19 159 lb (72.1 kg)  07/19/19 159 lb 12.8 oz (72.5 kg)  06/09/19 161 lb (73 kg)       Health Maintenance Due  Topic Date Due  . OPHTHALMOLOGY EXAM  08/02/2018  . FOOT EXAM  05/31/2019     Lab Results  Component Value Date   TSH 0.821 04/06/2019   Lab Results  Component Value Date   WBC 10.5 12/02/2018   HGB 11.4 (L) 12/02/2018   HCT 36.0 12/02/2018   MCV 97.8 12/02/2018   PLT 283 12/02/2018   Lab Results  Component Value Date   NA 142 06/13/2019   K 4.0 06/13/2019   CHLORIDE 106 09/15/2017   CO2 25 06/13/2019   GLUCOSE 90 06/13/2019   BUN 6 06/13/2019   CREATININE 0.73 06/13/2019   BILITOT 0.3 04/06/2019   ALKPHOS 101 04/06/2019   AST 18 04/06/2019   ALT 22 04/06/2019   PROT 7.5 04/06/2019   ALBUMIN 4.3 04/06/2019   CALCIUM 9.4 06/13/2019   ANIONGAP 10 06/13/2019   EGFR >60 09/15/2017   Lab Results  Component Value Date   CHOL 104 04/06/2019   Lab Results  Component Value Date   HDL 37 (L) 04/06/2019   Lab Results  Component Value Date   LDLCALC 53 04/06/2019   Lab Results  Component Value Date   TRIG 72 04/06/2019   Lab Results  Component Value Date   CHOLHDL 2.8 04/06/2019   Lab Results  Component Value Date   HGBA1C 6.3 (A) 07/19/2019      Assessment & Plan:   1. Low back pain with radiation Patient with radicular symptoms which was discussed with the patient and prescription provided for prednisone taper as well as tramadol to help with current pain.  She is also to obtain the lumbar spine film which was ordered at her previous visit.  She may also use warm moist heat to the lower back to help with pain and muscle spasm.  She may wish to take the previously prescribed Robaxin at nighttime to help with sleep initiation as well as muscle spasm/back pain.  She was made aware that the use of prednisone will temporarily increase her blood sugars and she should notify the office if blood sugars are greater than 200.  She has also been started on a new medication to help with control of her her blood sugars at today's visit. -  predniSONE (DELTASONE) 20 MG tablet; Take 3 pills today, 2 pills daily x 2 days, 1 pill daily x 2 days then 1/2 pill daily x 2 days; take after eating  Dispense: 10 tablet; Refill: 0 - traMADol (ULTRAM) 50 MG tablet; Take 1 tablet (50 mg total) by mouth every 6 (six) hours as needed for up to 5 days.  Dispense: 20 tablet; Refill: 0  2. Type 2 diabetes mellitus without complication, without long-term current use of insulin ([place]) Patient's hemoglobin A1c at her recent visit on 07/19/2019 was at 8.3 which is above goal of hemoglobin A1c of 7.0 which was discussed at today's visit.  We will have patient continue her current Metformin but will also add Amaryl 2 mg prior to first meal of the day.  Patient is to continue a low carbohydrate diet, continue exercise as tolerated with goal of weight loss as well as continued monitoring of blood sugars.  Discussed hemoglobin A1c goal of 7 or less which is an average blood sugar of around 140 daily.  Discussed goal blood sugar of 90-120 fasting and goal of blood sugar being between 1 40-1 60 within 2 hours of a meal. - lisinopril (ZESTRIL) 10 MG tablet; Take 1 tablet (10 mg total) by mouth daily. To lower blood pressure  Dispense: 90 tablet; Refill: 3 - glimepiride (AMARYL) 2 MG tablet; Take 1 tablet (2 mg total) by mouth daily with breakfast. To lower blood sugar  Dispense: 90 tablet; Refill: 3  3. Essential hypertension Blood pressure remains reasonably controlled at today's visit and she will continue lisinopril 10 mg.  Refill provided.  Continue low-sodium diet as well as dietary changes and exercise with goal of weight loss. - lisinopril (ZESTRIL) 10 MG tablet; Take 1 tablet (10 mg total) by mouth daily. To lower blood pressure  Dispense: 90 tablet; Refill: 3   An After Visit Summary was printed and given to the patient.   Follow-up: Return for back pain; 1-2 weeks if not better.  Keep scheduled follow-up of diabetes are make appointment for 2 to 2 months,  sooner if needed   Antony Blackbird, MD

## 2019-08-04 NOTE — Patient Instructions (Signed)
Sciatica ° °Sciatica is pain, weakness, tingling, or loss of feeling (numbness) along the sciatic nerve. The sciatic nerve starts in the lower back and goes down the back of each leg. Sciatica usually goes away on its own or with treatment. Sometimes, sciatica may come back (recur). °What are the causes? °This condition happens when the sciatic nerve is pinched or has pressure put on it. This may be the result of: °· A disk in between the bones of the spine bulging out too far (herniated disk). °· Changes in the spinal disks that occur with aging. °· A condition that affects a muscle in the butt. °· Extra bone growth near the sciatic nerve. °· A break (fracture) of the area between your hip bones (pelvis). °· Pregnancy. °· Tumor. This is rare. °What increases the risk? °You are more likely to develop this condition if you: °· Play sports that put pressure or stress on the spine. °· Have poor strength and ease of movement (flexibility). °· Have had a back injury in the past. °· Have had back surgery. °· Sit for long periods of time. °· Do activities that involve bending or lifting over and over again. °· Are very overweight (obese). °What are the signs or symptoms? °Symptoms can vary from mild to very bad. They may include: °· Any of these problems in the lower back, leg, hip, or butt: °? Mild tingling, loss of feeling, or dull aches. °? Burning sensations. °? Sharp pains. °· Loss of feeling in the back of the calf or the sole of the foot. °· Leg weakness. °· Very bad back pain that makes it hard to move. °These symptoms may get worse when you cough, sneeze, or laugh. They may also get worse when you sit or stand for long periods of time. °How is this treated? °This condition often gets better without any treatment. However, treatment may include: °· Changing or cutting back on physical activity when you have pain. °· Doing exercises and stretching. °· Putting ice or heat on the affected area. °· Medicines that  help: °? To relieve pain and swelling. °? To relax your muscles. °· Shots (injections) of medicines that help to relieve pain, irritation, and swelling. °· Surgery. °Follow these instructions at home: °Medicines °· Take over-the-counter and prescription medicines only as told by your doctor. °· Ask your doctor if the medicine prescribed to you: °? Requires you to avoid driving or using heavy machinery. °? Can cause trouble pooping (constipation). You may need to take these steps to prevent or treat trouble pooping: °§ Drink enough fluids to keep your pee (urine) pale yellow. °§ Take over-the-counter or prescription medicines. °§ Eat foods that are high in fiber. These include beans, whole grains, and fresh fruits and vegetables. °§ Limit foods that are high in fat and sugar. These include fried or sweet foods. °Managing pain ° °  ° °· If told, put ice on the affected area. °? Put ice in a plastic bag. °? Place a towel between your skin and the bag. °? Leave the ice on for 20 minutes, 2-3 times a day. °· If told, put heat on the affected area. Use the heat source that your doctor tells you to use, such as a moist heat pack or a heating pad. °? Place a towel between your skin and the heat source. °? Leave the heat on for 20-30 minutes. °? Remove the heat if your skin turns bright red. This is very important if you are   unable to feel pain, heat, or cold. You may have a greater risk of getting burned. °Activity ° °· Return to your normal activities as told by your doctor. Ask your doctor what activities are safe for you. °· Avoid activities that make your symptoms worse. °· Take short rests during the day. °? When you rest for a long time, do some physical activity or stretching between periods of rest. °? Avoid sitting for a long time without moving. Get up and move around at least one time each hour. °· Exercise and stretch regularly, as told by your doctor. °· Do not lift anything that is heavier than 10 lb (4.5 kg)  while you have symptoms of sciatica. °? Avoid lifting heavy things even when you do not have symptoms. °? Avoid lifting heavy things over and over. °· When you lift objects, always lift in a way that is safe for your body. To do this, you should: °? Bend your knees. °? Keep the object close to your body. °? Avoid twisting. °General instructions °· Stay at a healthy weight. °· Wear comfortable shoes that support your feet. Avoid wearing high heels. °· Avoid sleeping on a mattress that is too soft or too hard. You might have less pain if you sleep on a mattress that is firm enough to support your back. °· Keep all follow-up visits as told by your doctor. This is important. °Contact a doctor if: °· You have pain that: °? Wakes you up when you are sleeping. °? Gets worse when you lie down. °? Is worse than the pain you have had in the past. °? Lasts longer than 4 weeks. °· You lose weight without trying. °Get help right away if: °· You cannot control when you pee (urinate) or poop (have a bowel movement). °· You have weakness in any of these areas and it gets worse: °? Lower back. °? The area between your hip bones. °? Butt. °? Legs. °· You have redness or swelling of your back. °· You have a burning feeling when you pee. °Summary °· Sciatica is pain, weakness, tingling, or loss of feeling (numbness) along the sciatic nerve. °· This condition happens when the sciatic nerve is pinched or has pressure put on it. °· Sciatica can cause pain, tingling, or loss of feeling (numbness) in the lower back, legs, hips, and butt. °· Treatment often includes rest, exercise, medicines, and putting ice or heat on the affected area. °This information is not intended to replace advice given to you by your health care provider. Make sure you discuss any questions you have with your health care provider. °Document Released: 06/09/2008 Document Revised: 09/19/2018 Document Reviewed: 09/19/2018 °Elsevier Patient Education © 2020 [employer] ° °

## 2019-08-20 ENCOUNTER — Other Ambulatory Visit: Payer: Self-pay | Admitting: Family Medicine

## 2019-08-20 DIAGNOSIS — E118 Type 2 diabetes mellitus with unspecified complications: Secondary | ICD-10-CM

## 2019-08-24 ENCOUNTER — Other Ambulatory Visit: Payer: Self-pay

## 2019-08-24 ENCOUNTER — Encounter: Payer: Self-pay | Admitting: Family Medicine

## 2019-08-24 ENCOUNTER — Ambulatory Visit: Payer: Medicaid Other | Attending: Family Medicine | Admitting: Family Medicine

## 2019-08-24 DIAGNOSIS — I10 Essential (primary) hypertension: Secondary | ICD-10-CM

## 2019-08-24 DIAGNOSIS — E119 Type 2 diabetes mellitus without complications: Secondary | ICD-10-CM | POA: Diagnosis not present

## 2019-08-24 DIAGNOSIS — M545 Low back pain, unspecified: Secondary | ICD-10-CM

## 2019-08-24 MED ORDER — MELOXICAM 15 MG PO TABS: 15.0000 mg | ORAL_TABLET | Freq: Every day | ORAL | 3 refills | Status: DC

## 2019-08-24 MED ORDER — METHOCARBAMOL 500 MG PO TABS: ORAL_TABLET | ORAL | 0 refills | Status: DC

## 2019-08-24 MED ORDER — LISINOPRIL 10 MG PO TABS: 10.0000 mg | ORAL_TABLET | Freq: Every day | ORAL | 3 refills | Status: DC

## 2019-08-24 MED ORDER — SULFAMETHOXAZOLE-TRIMETHOPRIM 800-160 MG PO TABS: 1.0000 | ORAL_TABLET | Freq: Two times a day (BID) | ORAL | 0 refills | Status: AC

## 2019-08-24 NOTE — Progress Notes (Signed)
Virtual Visit via Telephone Note  I connected with Erin [name] via sign language interpreter on 08/24/19 at  1:50 PM EST by telephone and verified that I am speaking with the correct person using two identifiers.   I discussed the limitations, risks, security and privacy concerns of performing an evaluation and management service by telephone and the availability of in person appointments. I also discussed with the patient that there may be a patient responsible charge related to this service. The patient expressed understanding and agreed to proceed.  Patient Location: Home Provider Location: [facility] Others participating in call: call initiated by Francisco Capuchin, CMA, who obtained sign language through [employer]   History of Present Illness:         58 year old female with complaint of acute onset yesterday on the left side of her lower back.  She believes that this may have been related to lifting and bending.  She states that she feels as if she needs a muscle relaxant and would like a refill of pain medication she has taken in the past.  Current pain is about a 6-8 on a 0-to-10 scale depending on positioning.  Pain is dull and aching.  She is not having any radiation of the pain into the buttocks or legs.  She has noticed some mild increased urinary frequency and wonders if she might also have urinary tract infection.  She reports that her blood sugars have actually been better controlled with the current Metformin and Amaryl.  Her fasting blood sugar this morning was 143.  She also needs refill of her lisinopril for control of blood pressure.  She denies any issues with cough associated with lisinopril use.  She denies any issues with chest pain or palpitations, no shortness of breath or cough.  No current abdominal pain-no nausea or vomiting.   Past Medical History:  Diagnosis Date  . Anemia   . Arthritis    knees, elbows  . BRCA2 gene mutation positive 02/17/2018    BRCA2 c.4552del (p.Glu1518Asnfs*25)  Result reported out on 02/15/2018.   . Breast cancer ([place]) 05/2017   left  . Cough 06/03/2017  . Deaf   . Depression   . Dyspnea    states some SOB with ADLs and aanemia   . Family history of breast cancer   . Family history of breast cancer   . Family history of colon cancer   . Genetic testing 02/17/2018   The Common Hereditary Cancer Panel offered by [facility] includes sequencing and/or deletion duplication testing of the following 47 genes: APC, ATM, AXIN2, BARD1, BMPR1A, BRCA1, BRCA2, BRIP1, CDH1, CDKN2A (p14ARF), CDKN2A (p16INK4a), CKD4, CHEK2, CTNNA1, DICER1, EPCAM (Deletion/duplication testing only), GREM1 (promoter region deletion/duplication testing only), KIT, MEN1, MLH1, MSH2, MSH3, MSH6, MU  . High cholesterol   . History of blood transfusion 07/2017   [facility]  . Hypertension   . Non-insulin dependent type 2 diabetes mellitus ([place])   . Runny nose 06/03/2017   clear drainage, per pt.  . SVD (spontaneous vaginal delivery)    x 2    Past Surgical History:  Procedure Laterality Date  . BREAST LUMPECTOMY Left 06/10/2007  . BREAST LUMPECTOMY WITH RADIOACTIVE SEED AND SENTINEL LYMPH NODE BIOPSY Left 06/09/2017   Procedure: BREAST LUMPECTOMY WITH RADIOACTIVE SEED AND SENTINEL LYMPH NODE BIOPSY;  Surgeon: Excell Seltzer, MD;  Location: [place];  Service: General;  Laterality: Left;  . COLONOSCOPY  11/02/2017   polyps  . LAPAROSCOPIC ASSISTED VAGINAL HYSTERECTOMY  N/A 11/04/2017   Procedure: LAPAROSCOPIC ASSISTED VAGINAL HYSTERECTOMY;  Surgeon: [name], MD;  Location: [facility];  Service: Gynecology;  Laterality: N/A;  . LAPAROSCOPIC BILATERAL SALPINGO OOPHERECTOMY Bilateral 11/04/2017   Procedure: LAPAROSCOPIC BILATERAL SALPINGO OOPHORECTOMY;  Surgeon: [name], MD;  Location: [facility];  Service: Gynecology;  Laterality: Bilateral;  . MULTIPLE TOOTH EXTRACTIONS    . PORTACATH PLACEMENT Right 06/09/2017   Procedure: INSERTION  PORT-A-CATH WITH [place];  Surgeon: Excell Seltzer, MD;  Location: [place];  Service: General;  Laterality: Right;  . TUBAL LIGATION  02/04/2002  . UPPER GI ENDOSCOPY      Family History  Problem Relation Age of Onset  . Hypertension Mother   . Diabetes Father   . Hypertension Sister   . Diabetes Paternal Grandmother   . Colon cancer Paternal Grandmother 13  . CAD Brother   . Diabetes Brother   . Breast cancer Maternal Aunt        dx >50  . Heart attack Paternal Grandfather   . Breast cancer Maternal Aunt        dx under 73  . Breast cancer Maternal Aunt        dx  under 32    Social History   Tobacco Use  . Smoking status: Never Smoker  . Smokeless tobacco: Never Used  Substance Use Topics  . Alcohol use: No  . Drug use: No     No Known Allergies     Observations/Objective: No vital signs or physical exam conducted as visit was done via telephone  Assessment and Plan: 1. Acute left-sided low back pain without sciatica Refill provided of meloxicam for recurrent acute back pain as well as refill of Robaxin.  Patient also reports some urinary symptoms similar to symptoms with prior urinary tract infection and prescription provided for Bactrim DS twice daily x3 days in case urinary tract infection may also be contributing to her back pain.  She may also use warm moist heat to the lower back to help with pain.  Call or return in the next few days if back pain is not improving or if pain is worsening. - meloxicam (MOBIC) 15 MG tablet; Take 1 tablet (15 mg total) by mouth daily. Prn pain; take after eating  Dispense: 30 tablet; Refill: 3 - sulfamethoxazole-trimethoprim (BACTRIM DS) 800-160 MG tablet; Take 1 tablet by mouth 2 (two) times daily for 3 days.  Dispense: 6 tablet; Refill: 0  2. Type 2 diabetes mellitus without complication, without long-term current use of insulin (HCC) Refill provided of patient's lisinopril for control of blood pressure.  She  reports improved control of diabetes with fasting blood sugar of 143 today.  Continue Metformin and Amaryl as well as monitoring of blood sugars and low carbohydrate diet. - lisinopril (ZESTRIL) 10 MG tablet; Take 1 tablet (10 mg total) by mouth daily. To lower blood pressure  Dispense: 90 tablet; Refill: 3  3. Essential hypertension Refill provided of patient's blood pressure medication, lisinopril.  Continue healthy, low-sodium diet - lisinopril (ZESTRIL) 10 MG tablet; Take 1 tablet (10 mg total) by mouth daily. To lower blood pressure  Dispense: 90 tablet; Refill: 3  Follow Up Instructions:Return for Follow-up in a few days if the head pain is not improving; keep scheduled f/u of chronic issues.    I discussed the assessment and treatment plan with the patient. The patient was provided an opportunity to ask questions and all were answered. The patient agreed with the  plan and demonstrated an understanding of the instructions.   The patient was advised to call back or seek an in-person evaluation if the symptoms worsen or if the condition fails to improve as anticipated.  I provided 14 minutes of non-face-to-face time during this encounter.   Antony Blackbird, MD

## 2019-08-24 NOTE — Progress Notes (Signed)
Patient verified DOB Patient has taken medication today. Patient has eaten today. Patient complains of pain in the back and would like a refill on robaxin. Pain was worse yesterday limiting ROM. Patient needs refill on BP medication. CBG this morning was 143 before eating

## 2019-09-05 ENCOUNTER — Other Ambulatory Visit: Payer: Self-pay

## 2019-09-05 ENCOUNTER — Encounter: Payer: Self-pay | Admitting: Gynecologic Oncology

## 2019-09-05 ENCOUNTER — Inpatient Hospital Stay: Payer: Medicaid Other

## 2019-09-05 ENCOUNTER — Inpatient Hospital Stay: Payer: Medicaid Other | Attending: Gynecologic Oncology | Admitting: Gynecologic Oncology

## 2019-09-05 VITALS — BP 123/71 | HR 98 | Temp 98.9°F | Resp 17 | Ht 64.0 in | Wt 166.4 lb

## 2019-09-05 DIAGNOSIS — Z803 Family history of malignant neoplasm of breast: Secondary | ICD-10-CM | POA: Diagnosis not present

## 2019-09-05 DIAGNOSIS — Z853 Personal history of malignant neoplasm of breast: Secondary | ICD-10-CM | POA: Insufficient documentation

## 2019-09-05 DIAGNOSIS — Z1502 Genetic susceptibility to malignant neoplasm of ovary: Secondary | ICD-10-CM

## 2019-09-05 DIAGNOSIS — Z79899 Other long term (current) drug therapy: Secondary | ICD-10-CM | POA: Diagnosis not present

## 2019-09-05 DIAGNOSIS — Z8249 Family history of ischemic heart disease and other diseases of the circulatory system: Secondary | ICD-10-CM | POA: Insufficient documentation

## 2019-09-05 DIAGNOSIS — Z9221 Personal history of antineoplastic chemotherapy: Secondary | ICD-10-CM

## 2019-09-05 DIAGNOSIS — Z90710 Acquired absence of both cervix and uterus: Secondary | ICD-10-CM | POA: Insufficient documentation

## 2019-09-05 DIAGNOSIS — Z7984 Long term (current) use of oral hypoglycemic drugs: Secondary | ICD-10-CM | POA: Diagnosis not present

## 2019-09-05 DIAGNOSIS — Z8349 Family history of other endocrine, nutritional and metabolic diseases: Secondary | ICD-10-CM | POA: Diagnosis not present

## 2019-09-05 DIAGNOSIS — R971 Elevated cancer antigen 125 [CA 125]: Secondary | ICD-10-CM | POA: Diagnosis not present

## 2019-09-05 DIAGNOSIS — Z800 Family history of malignant neoplasm of digestive organs: Secondary | ICD-10-CM | POA: Diagnosis not present

## 2019-09-05 DIAGNOSIS — Z90722 Acquired absence of ovaries, bilateral: Secondary | ICD-10-CM | POA: Insufficient documentation

## 2019-09-05 DIAGNOSIS — C5701 Malignant neoplasm of right fallopian tube: Secondary | ICD-10-CM | POA: Insufficient documentation

## 2019-09-05 DIAGNOSIS — Z923 Personal history of irradiation: Secondary | ICD-10-CM | POA: Insufficient documentation

## 2019-09-05 DIAGNOSIS — K5900 Constipation, unspecified: Secondary | ICD-10-CM | POA: Insufficient documentation

## 2019-09-05 DIAGNOSIS — Z9079 Acquired absence of other genital organ(s): Secondary | ICD-10-CM | POA: Insufficient documentation

## 2019-09-05 DIAGNOSIS — Z833 Family history of diabetes mellitus: Secondary | ICD-10-CM | POA: Insufficient documentation

## 2019-09-05 NOTE — Patient Instructions (Signed)
Dr Denman George is drew your CA 125 today. Her office will call you with the result. If it is higher than it was in September, she will recheck your scan and some additional labs to evaluate for a reason.  If it is stable or normal, she will have you return in 3 months for a repeat evaluation.  Please notify Dr Denman George at phone number [phone] if you notice vaginal bleeding, new pelvic or abdominal pains, bloating, feeling full easy, or a change in bladder or bowel function.   Try dried apricots or prunes for your constipation. Try polyethylene glycol (miralax) every morning (in addition to every night if necessary) for your constipation.

## 2019-09-05 NOTE — Progress Notes (Signed)
Follow-up Note: Gyn-Onc  Consult was initially requested by Dr. Kennon Rounds for the evaluation of Erin [name] 58 y.o. female  CC:  Chief Complaint  Patient presents with  . Fallopian tube cancer, carcinoma, right [facility])    Assessment/Plan:  Erin [name]  is a 58 y.o.  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   S/p 6 cycles adjuvant carboplatin/taxol chemotherapy between April 2019-October, 2019.   Complete clinical response. Elevated CA 125 in September without CT evidence of recurrent disease. Retested today. If normal or stable - continue 3 monthly evaluations. If rising, recheck CMET and CT abd/pelvis.  HPI: Erin [name] is a 58 year old woman who is deaf and is seen in consultation at the request of Dr Kennon Rounds for clinical stage IC fallopian tube cancer.   The patient has a history of ER PR negative HER-2 positive breast cancer on the left side which was diagnosed in August 2018.  It was treated with lumpectomy in September 2018 followed by Adriamycin and Taxol chemotherapy.  She was then placed on Herceptin with a plan to commence radiation therapy in March 2019.  She reported menorrhagia with irregular menses and therefore transvaginal ultrasound was performed on June 29, 2017.  It showed a uterus measuring 11.8 x 5.2 x 6.3 cm with a septated fluid-filled structure adjacent to the dome of the uterus.  The endometrium was thickened at 10.2 mm the right ovary measured 3.2 x 2.1 x 2.7 cm and the left ovary measured 9.8 x 7.8 x 11.8 cm.  The ovarian tissue is largely occupied by a complex cyst measuring 7.5 x 9.9 x 9.6 cm with a prominent septation.  There was no free fluid identified.  To follow-up with this ultrasound Dr. Kennon Rounds obtained an MRI of the pelvis on September 06, 2017 which revealed a uterus of normal size measuring 7.5  x 5 x 4.5 cm.  There is a bilobed cystic lesion presumably originating from the left adnexa which is different from the ultrasound report of a right adnexal complex cyst.  The cystic complex measures 12.8 x 10.9 x 8 cm.  The there was no nodularity.  The right ovary appeared normal.  It had no worrisome features.  Ca1 25 was drawn which was normal.  The patient was taken to the operating room on November 04, 2017 for laparoscopic assisted vaginal hysterectomy with BSO.  Surgical findings were significant for an 8-week size uterus with normal adnexa on the left and the right adnexal mass measured personally 12 cm.  It felt to be most consistent with a paratubal cyst.  There is normal upper abdomen reported washings were not obtained.  Surgery was uncomplicated.  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  The remainder of the entire right fallopian tube was submitted and no additional malignancy was identified.  The the associated ovarian cyst on the right was benign the left adnexa was unremarkable.  The uterus was also normal including the endometrium.  The patient is healed well from her surgery with no complaints.  She has a paternal grandmother who had colon cancer, a maternal half aunt with history of breast cancer, 2 paternal aunts with breast cancer.  There is no family history  of ovarian cancer.  She has had 2 prior NSVDs in the past.  She is otherwise fairly healthy.  She underwent genetic testing in June, 2019 which revealed a deleterious mutation in BRCA 2.   She received 6 cycles of carboplatin and paclitaxel chemotherapy between April, 2019 and October 2019.  She is on Herceptin maintenance therapy for her breast cancer.   She had not had CA 125 checked postop including throughout chemotherapy. CA 125 was drawn as surveillance on 02/27/19 and was 31.  Interval Hx:   CA  125 was drawn on 06/09/19 and was elevated at 218.  This prompted her to have a CT scan of the abdomen and pelvis with IV contrast which revealed hepatomegaly without biliary duct dilation.  Tiny hiatal hernia.  Small left paramidline fat-containing ventral abdominal wall hernia but no evidence of metastatic disease in the abdomen or pelvis.  She has no symptoms concerning for recurrence.  She does have some intermittent constipation and does not like the taste of prune juice we counseled her about use of MiraLAX or dried prunes and dried apricots to help with her stools.  Current Meds:  Outpatient Encounter Medications as of 09/05/2019  Medication Sig  . Accu-Chek FastClix Lancets MISC Use as instructed. Inject into the skin twice daily  . atorvastatin (LIPITOR) 40 MG tablet Take 1 tablet (40 mg total) by mouth daily.  . Blood Glucose Calibration (ACCU-CHEK GUIDE CONTROL) LIQD 1 each by In Vitro route once as needed for up to 1 dose.  . cholecalciferol (VITAMIN D) 1000 units tablet Take 1,000 Units by mouth daily.  . ferrous sulfate 325 (65 FE) MG tablet Take 1 tablet (325 mg total) by mouth daily before breakfast.  . glimepiride (AMARYL) 2 MG tablet Take 1 tablet (2 mg total) by mouth daily with breakfast. To lower blood sugar  . glucose blood (ACCU-CHEK GUIDE) test strip Use as instructed. Check blood glucose by fingerstick twice per day.  [employer] (ACCU-CHEK SOFTCLIX) lancets Use as instructed  . lisinopril (ZESTRIL) 10 MG tablet Take 1 tablet (10 mg total) by mouth daily. To lower blood pressure  . meloxicam (MOBIC) 15 MG tablet Take 1 tablet (15 mg total) by mouth daily. Prn pain; take after eating  . metFORMIN (GLUCOPHAGE-XR) 500 MG 24 hr tablet TAKE 2 TABLETS BY MOUTH EVERY DAY WITH BREAKFAST  . methocarbamol (ROBAXIN) 500 MG tablet May take one pill every 8 hours during the day and two pills at bedtime if needed for muscle spasm  . gabapentin (NEURONTIN) 100 MG capsule One or two  capsules at bedtime as needed for thumb pain (Patient not taking: Reported on 09/05/2019)   Facility-Administered Encounter Medications as of 09/05/2019  Medication  . 0.9 %  sodium chloride infusion    Allergy: No Known Allergies  Social Hx:   Social History   Socioeconomic History  . Marital status: Legally Separated    Spouse name: Not on file  . Number of children: Not on file  . Years of education: Not on file  . Highest education level: Not on file  Occupational History  . Not on file  Tobacco Use  . Smoking status: Never Smoker  . Smokeless tobacco: Never Used  Substance and Sexual Activity  . Alcohol use: No  . Drug use: No  . Sexual activity: Never    Birth control/protection: None  Other Topics Concern  . Not on file  Social History Narrative   1 boy and 1 girl  Resides in [place] Strain:   . Difficulty of Paying Living Expenses: Not on file  Food Insecurity:   . Worried About Charity fundraiser in the Last Year: Not on file  . Ran Out of Food in the Last Year: Not on file  Transportation Needs:   . Lack of Transportation (Medical): Not on file  . Lack of Transportation (Non-Medical): Not on file  Physical Activity:   . Days of Exercise per Week: Not on file  . Minutes of Exercise per Session: Not on file  Stress:   . Feeling of Stress : Not on file  Social Connections:   . Frequency of Communication with Friends and Family: Not on file  . Frequency of Social Gatherings with Friends and Family: Not on file  . Attends Religious Services: Not on file  . Active Member of Clubs or Organizations: Not on file  . Attends Archivist Meetings: Not on file  . Marital Status: Not on file  Intimate Partner Violence:   . Fear of Current or Ex-Partner: Not on file  . Emotionally Abused: Not on file  . Physically Abused: Not on file  . Sexually Abused: Not on file    Past  Surgical Hx:  Past Surgical History:  Procedure Laterality Date  . BREAST LUMPECTOMY Left 06/10/2007  . BREAST LUMPECTOMY WITH RADIOACTIVE SEED AND SENTINEL LYMPH NODE BIOPSY Left 06/09/2017   Procedure: BREAST LUMPECTOMY WITH RADIOACTIVE SEED AND SENTINEL LYMPH NODE BIOPSY;  Surgeon: Excell Seltzer, MD;  Location: [place];  Service: General;  Laterality: Left;  . COLONOSCOPY  11/02/2017   polyps  . LAPAROSCOPIC ASSISTED VAGINAL HYSTERECTOMY N/A 11/04/2017   Procedure: LAPAROSCOPIC ASSISTED VAGINAL HYSTERECTOMY;  Surgeon: [name], MD;  Location: [facility];  Service: Gynecology;  Laterality: N/A;  . LAPAROSCOPIC BILATERAL SALPINGO OOPHERECTOMY Bilateral 11/04/2017   Procedure: LAPAROSCOPIC BILATERAL SALPINGO OOPHORECTOMY;  Surgeon: [name], MD;  Location: [facility];  Service: Gynecology;  Laterality: Bilateral;  . MULTIPLE TOOTH EXTRACTIONS    . PORTACATH PLACEMENT Right 06/09/2017   Procedure: INSERTION PORT-A-CATH WITH [place];  Surgeon: Excell Seltzer, MD;  Location: [place];  Service: General;  Laterality: Right;  . TUBAL LIGATION  02/04/2002  . UPPER GI ENDOSCOPY      Past Medical Hx:  Past Medical History:  Diagnosis Date  . Anemia   . Arthritis    knees, elbows  . BRCA2 gene mutation positive 02/17/2018   BRCA2 c.4552del (p.Glu1518Asnfs*25)  Result reported out on 02/15/2018.   . Breast cancer ([place]) 05/2017   left  . Cough 06/03/2017  . Deaf   . Depression   . Dyspnea    states some SOB with ADLs and aanemia   . Family history of breast cancer   . Family history of breast cancer   . Family history of colon cancer   . Genetic testing 02/17/2018   The Common Hereditary Cancer Panel offered by [facility] includes sequencing and/or deletion duplication testing of the following 47 genes: APC, ATM, AXIN2, BARD1, BMPR1A, BRCA1, BRCA2, BRIP1, CDH1, CDKN2A (p14ARF), CDKN2A (p16INK4a), CKD4, CHEK2, CTNNA1, DICER1, EPCAM (Deletion/duplication testing  only), GREM1 (promoter region deletion/duplication testing only), KIT, MEN1, MLH1, MSH2, MSH3, MSH6, MU  . High cholesterol   . History of blood transfusion 07/2017   [facility]  . Hypertension   . Non-insulin dependent type 2 diabetes mellitus ([place])   . Runny  nose 06/03/2017   clear drainage, per pt.  . SVD (spontaneous vaginal delivery)    x 2    Past Gynecological History:  SVDx 2, s/p hysterectomy Patient's last menstrual period was 07/09/2017 (lmp unknown).  Family Hx:  Family History  Problem Relation Age of Onset  . Hypertension Mother   . Diabetes Father   . Hypertension Sister   . Diabetes Paternal Grandmother   . Colon cancer Paternal Grandmother 54  . CAD Brother   . Diabetes Brother   . Breast cancer Maternal Aunt        dx >50  . Heart attack Paternal Grandfather   . Breast cancer Maternal Aunt        dx under 80  . Breast cancer Maternal Aunt        dx  under 50    Review of Systems:  Constitutional  Feels well,    ENT Normal appearing ears and nares bilaterally Skin/Breast  No rash, sores, jaundice, itching, dryness Cardiovascular  No chest pain, shortness of breath, or edema  Pulmonary  No cough or wheeze.  Gastro Intestinal  No nausea, vomitting, or diarrhoea. No bright red blood per rectum, no abdominal pain, change in bowel movement, or constipation.  Genito Urinary  No frequency, urgency, dysuria,  Musculo Skeletal  No myalgia, arthralgia, joint swelling or pain  Neurologic  No weakness, numbness, change in gait,  Psychology  No depression, anxiety, insomnia.   Vitals:  Blood pressure 123/71, pulse 98, temperature 98.9 F (37.2 C), temperature source Temporal, resp. rate 17, height _0  (1.626 m), weight 166 lb 6.4 oz (75.5 kg), last menstrual period 07/09/2017, SpO2 99 %.  Physical Exam: WD in NAD Neck  Supple NROM, without any enlargements.  Lymph Node Survey No cervical supraclavicular or inguinal adenopathy Cardiovascular  Pulse  normal rate, regularity and rhythm. S1 and S2 normal.  Lungs  Clear to auscultation bilateraly, without wheezes/crackles/rhonchi. Good air movement.  Skin  No rash/lesions/breakdown  Psychiatry  Alert and oriented to person, place, and time  Abdomen  Normoactive bowel sounds, abdomen soft, non-tender and obese without evidence of hernia. Soft incisions. No masses. Back No CVA tenderness Genito Urinary  No palpable masses in vagina or pelvis. Rectal  No palpable masses Extremities  No bilateral cyanosis, clubbing or edema.   Thereasa Solo, MD  09/05/2019, 3:56 PM

## 2019-09-06 ENCOUNTER — Other Ambulatory Visit: Payer: Self-pay

## 2019-09-06 ENCOUNTER — Other Ambulatory Visit: Payer: Self-pay | Admitting: Gynecologic Oncology

## 2019-09-06 ENCOUNTER — Inpatient Hospital Stay: Payer: Medicaid Other

## 2019-09-06 ENCOUNTER — Telehealth: Payer: Self-pay

## 2019-09-06 DIAGNOSIS — C5701 Malignant neoplasm of right fallopian tube: Secondary | ICD-10-CM | POA: Diagnosis not present

## 2019-09-06 DIAGNOSIS — R971 Elevated cancer antigen 125 [CA 125]: Secondary | ICD-10-CM

## 2019-09-06 LAB — COMPREHENSIVE METABOLIC PANEL
ALT: 22 U/L (ref 0–44)
AST: 18 U/L (ref 15–41)
Albumin: 3.8 g/dL (ref 3.5–5.0)
Alkaline Phosphatase: 96 U/L (ref 38–126)
Anion gap: 11 (ref 5–15)
BUN: 14 mg/dL (ref 6–20)
CO2: 25 mmol/L (ref 22–32)
Calcium: 9.2 mg/dL (ref 8.9–10.3)
Chloride: 105 mmol/L (ref 98–111)
Creatinine, Ser: 0.77 mg/dL (ref 0.44–1.00)
GFR calc Af Amer: >60 mL/min (ref >60)
GFR calc non Af Amer: >60 mL/min (ref >60)
Glucose, Bld: 97 mg/dL (ref 70–99)
Potassium: 4.1 mmol/L (ref 3.5–5.1)
Sodium: 141 mmol/L (ref 135–145)
Total Bilirubin: 0.2 mg/dL — ABNORMAL LOW (ref 0.3–1.2)
Total Protein: 7.5 g/dL (ref 6.5–8.1)

## 2019-09-06 LAB — CA 125: Cancer Antigen (CA) 125: 1088 U/mL — ABNORMAL HIGH (ref 0.0–38.1)

## 2019-09-06 NOTE — Telephone Encounter (Addendum)
Told Erin [name] the results of the CA-125. Value was 1,088.0. Gave her an appointment  for CT scan for 09-14-19 at [facility] at 0900. Arrive at [employer]. This scan will look in her A/P to see if her cancer has come back. She will come in today 09-06-19 at 2 pm for c-met and pick up contrast. Con trast with written instructions brought to the lab for patient to p/u at 2 pm. Pt verbalized understanding.

## 2019-09-06 NOTE — Progress Notes (Signed)
CA 125 with significant increase.  Plan for Cmet and CT AP per Dr. Denman George.

## 2019-09-06 NOTE — Telephone Encounter (Signed)
-----   Message from Dorothyann Gibbs, NP sent at 09/06/2019  8:55 AM EST ----- Looks like her CA 125 has increased dramatically. Per Rossi's note, plan would be for metabolic panel then CT. I will get these ordered for you ladies. Thanks

## 2019-09-13 ENCOUNTER — Telehealth: Payer: Self-pay | Admitting: *Deleted

## 2019-09-13 ENCOUNTER — Encounter: Payer: Medicare Other | Admitting: Neurology

## 2019-09-13 NOTE — Telephone Encounter (Signed)
No showed NCV/EMG appointment. 

## 2019-09-14 ENCOUNTER — Ambulatory Visit (HOSPITAL_COMMUNITY)
Admission: RE | Admit: 2019-09-14 | Discharge: 2019-09-14 | Disposition: A | Discharge status: Home / Self Care | Payer: Medicaid Other | Source: Ambulatory Visit | Attending: Gynecologic Oncology | Admitting: Gynecologic Oncology

## 2019-09-14 ENCOUNTER — Other Ambulatory Visit: Payer: Self-pay

## 2019-09-14 DIAGNOSIS — R971 Elevated cancer antigen 125 [CA 125]: Secondary | ICD-10-CM

## 2019-09-14 DIAGNOSIS — C5701 Malignant neoplasm of right fallopian tube: Secondary | ICD-10-CM | POA: Diagnosis present

## 2019-09-14 MED ORDER — SODIUM CHLORIDE (PF) 0.9 % IJ SOLN
INTRAMUSCULAR | Status: AC
  Filled 2019-09-14: qty 50

## 2019-09-14 MED ORDER — IOHEXOL 300 MG/ML  SOLN
100.0000 mL | Freq: Once | INTRAMUSCULAR | Status: AC | PRN
  Administered 2019-09-14: 09:00:00 100 mL via INTRAVENOUS

## 2019-09-15 HISTORY — PX: HIATAL HERNIA REPAIR: SHX195

## 2019-09-18 ENCOUNTER — Encounter: Payer: Self-pay | Admitting: Neurology

## 2019-09-20 ENCOUNTER — Telehealth: Payer: Self-pay

## 2019-09-20 NOTE — Telephone Encounter (Signed)
Told Ms Gachupin that the CT was negative but does not pick up smaller things.  Dr. Denman George would like to see her in the office 09-28-19 at 1215 to discuss  CT scan in more detail  and follow up. Pt verbalized understanding,

## 2019-09-28 ENCOUNTER — Other Ambulatory Visit: Payer: Self-pay

## 2019-09-28 ENCOUNTER — Encounter: Payer: Self-pay | Admitting: Gynecologic Oncology

## 2019-09-28 ENCOUNTER — Inpatient Hospital Stay: Payer: Medicaid Other | Attending: Gynecologic Oncology | Admitting: Gynecologic Oncology

## 2019-09-28 VITALS — BP 122/70 | HR 100 | Ht 64.0 in | Wt 161.4 lb

## 2019-09-28 DIAGNOSIS — R971 Elevated cancer antigen 125 [CA 125]: Secondary | ICD-10-CM | POA: Diagnosis present

## 2019-09-28 DIAGNOSIS — Z7984 Long term (current) use of oral hypoglycemic drugs: Secondary | ICD-10-CM | POA: Insufficient documentation

## 2019-09-28 DIAGNOSIS — Z90722 Acquired absence of ovaries, bilateral: Secondary | ICD-10-CM | POA: Diagnosis not present

## 2019-09-28 DIAGNOSIS — Z90710 Acquired absence of both cervix and uterus: Secondary | ICD-10-CM | POA: Diagnosis not present

## 2019-09-28 DIAGNOSIS — Z79899 Other long term (current) drug therapy: Secondary | ICD-10-CM | POA: Diagnosis not present

## 2019-09-28 DIAGNOSIS — Z148 Genetic carrier of other disease: Secondary | ICD-10-CM

## 2019-09-28 DIAGNOSIS — Z9221 Personal history of antineoplastic chemotherapy: Secondary | ICD-10-CM

## 2019-09-28 DIAGNOSIS — Z853 Personal history of malignant neoplasm of breast: Secondary | ICD-10-CM | POA: Diagnosis not present

## 2019-09-28 DIAGNOSIS — H9190 Unspecified hearing loss, unspecified ear: Secondary | ICD-10-CM | POA: Insufficient documentation

## 2019-09-28 DIAGNOSIS — Z803 Family history of malignant neoplasm of breast: Secondary | ICD-10-CM | POA: Diagnosis not present

## 2019-09-28 DIAGNOSIS — C5701 Malignant neoplasm of right fallopian tube: Secondary | ICD-10-CM

## 2019-09-28 DIAGNOSIS — Z800 Family history of malignant neoplasm of digestive organs: Secondary | ICD-10-CM | POA: Insufficient documentation

## 2019-09-28 MED ORDER — OXYCODONE HCL 5 MG PO TABS: 5.0000 mg | ORAL_TABLET | ORAL | 0 refills | Status: DC | PRN

## 2019-09-28 MED ORDER — SENNOSIDES-DOCUSATE SODIUM 8.6-50 MG PO TABS: 2.0000 | ORAL_TABLET | Freq: Every day | ORAL | 1 refills | Status: DC

## 2019-09-28 MED ORDER — IBUPROFEN 800 MG PO TABS: 800.0000 mg | ORAL_TABLET | Freq: Three times a day (TID) | ORAL | 0 refills | Status: DC | PRN

## 2019-09-28 NOTE — Progress Notes (Signed)
Follow-up Note: Gyn-Onc  Consult was initially requested by Dr. Kennon Rounds for the evaluation of [name] 59 y.o. female  CC:  Chief Complaint  Patient presents with  . 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 Elevated CA-125    Assessment/Plan:  Ms. ARNEISHA KINCANNON  is a 59 y.o.  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   S/p 6 cycles adjuvant carboplatin/taxol chemotherapy between April 2019-October, 2019.   Complete clinical response. Increasingly elevated CA 125 since September, 2020 without CT (abd/pelvis) evidence of recurrent disease. Will obtain CT chest to rule out occult chest disease.  I counseled Ms. Rando that I was concerned about occult cancer that was not apparent on CT imaging given how high her Ca1 25 years.  I am therefore recommending diagnostic laparoscopy with peritoneal biopsies to rule out definitively intraperitoneal recurrence.  I discussed the procedure, and anticipated outpatient status. I discussed the risks of the operation including  bleeding, infection, damage to internal organs (such as bladder,ureters, bowels), blood clot, reoperation and rehospitalization.  If this is negative we can be reassured that she does not have occult cancer that requires intervention at this point.  HPI: Ms Baysinger is a 59 year old woman who is deaf and is seen in consultation at the request of Dr Kennon Rounds for clinical stage IC fallopian tube cancer.   The patient has a history of ER PR negative HER-2 positive breast cancer on the left side which was diagnosed in August 2018.  It was treated with lumpectomy in September 2018 followed by Adriamycin and Taxol chemotherapy.  She was then placed on Herceptin with a plan to commence radiation therapy in March 2019.  She reported menorrhagia with irregular menses and therefore transvaginal  ultrasound was performed on June 29, 2017.  It showed a uterus measuring 11.8 x 5.2 x 6.3 cm with a septated fluid-filled structure adjacent to the dome of the uterus.  The endometrium was thickened at 10.2 mm the right ovary measured 3.2 x 2.1 x 2.7 cm and the left ovary measured 9.8 x 7.8 x 11.8 cm.  The ovarian tissue is largely occupied by a complex cyst measuring 7.5 x 9.9 x 9.6 cm with a prominent septation.  There was no free fluid identified.  To follow-up with this ultrasound Dr. Kennon Rounds obtained an MRI of the pelvis on September 06, 2017 which revealed a uterus of normal size measuring 7.5 x 5 x 4.5 cm.  There is a bilobed cystic lesion presumably originating from the left adnexa which is different from the ultrasound report of a right adnexal complex cyst.  The cystic complex measures 12.8 x 10.9 x 8 cm.  The there was no nodularity.  The right ovary appeared normal.  It had no worrisome features.  Ca1 25 was drawn which was normal.  The patient was taken to the operating room on November 04, 2017 for laparoscopic assisted vaginal hysterectomy with BSO.  Surgical findings were significant for an 8-week size uterus with normal adnexa on the left and the right adnexal mass measured personally 12 cm.  It felt to be most consistent with a paratubal cyst.  There is normal upper abdomen reported washings were not obtained.  Surgery was uncomplicated.  Postoperative pathology was unexpected with the finding of a 0.6 cm high-grade serous carcinoma in the  right fallopian tube which involved the serosal surface.  The remainder of the entire right fallopian tube was submitted and no additional malignancy was identified.  The the associated ovarian cyst on the right was benign the left adnexa was unremarkable.  The uterus was also normal including the endometrium.  The patient is healed well from her surgery with no complaints.  She has a paternal grandmother who had colon cancer, a maternal half aunt with  history of breast cancer, 2 paternal aunts with breast cancer.  There is no family history of ovarian cancer.  She has had 2 prior NSVDs in the past.  She is otherwise fairly healthy.  She underwent genetic testing in June, 2019 which revealed a deleterious mutation in BRCA 2.   She received 6 cycles of carboplatin and paclitaxel chemotherapy between April, 2019 and October 2019.  She is on Herceptin maintenance therapy for her breast cancer.   She had not had CA 125 checked postop including throughout chemotherapy. CA 125 was drawn as surveillance on 02/27/19 and was 31.  CA 125 was drawn on 06/09/19 and was elevated at 218.  This prompted her to have a CT scan of the abdomen and pelvis with IV contrast which revealed hepatomegaly without biliary duct dilation.  Tiny hiatal hernia.  Small left paramidline fat-containing ventral abdominal wall hernia but no evidence of metastatic disease in the abdomen or pelvis.  Interval Hx:  Repeat Ca1 25 on September 05, 2019 was significantly elevated at 1088.  Due to this elevation c-Met was drawn to rule out occult liver disease as an explanation for her elevated tumor marker, this was normal for normal liver function tests and renal function.  Repeat CT scan of the abdomen and pelvis was performed on September 14, 2019.  This revealed a normal-appearing liver, spleen, tiny hiatal hernia, normal appendix, no pathologically enlarged lymph nodes, no evidence of peritoneal soft tissue thickening or ascites, surgically absent gynecologic organs, small epigastric ventral hernia seen containing fat.  No evidence of recurrent or metastatic carcinoma in the abdomen or pelvis.  She has no symptoms concerning for recurrence.      Current Meds:  Outpatient Encounter Medications as of 09/28/2019  Medication Sig  . Accu-Chek FastClix Lancets MISC Use as instructed. Inject into the skin twice daily  . atorvastatin (LIPITOR) 40 MG tablet Take 1 tablet (40 mg total) by  mouth daily.  . Blood Glucose Calibration (ACCU-CHEK GUIDE CONTROL) LIQD 1 each by In Vitro route once as needed for up to 1 dose.  . cholecalciferol (VITAMIN D) 1000 units tablet Take 1,000 Units by mouth daily.  . ferrous sulfate 325 (65 FE) MG tablet Take 1 tablet (325 mg total) by mouth daily before breakfast.  . gabapentin (NEURONTIN) 100 MG capsule One or two capsules at bedtime as needed for thumb pain (Patient not taking: Reported on 09/05/2019)  . glimepiride (AMARYL) 2 MG tablet Take 1 tablet (2 mg total) by mouth daily with breakfast. To lower blood sugar  . glucose blood (ACCU-CHEK GUIDE) test strip Use as instructed. Check blood glucose by fingerstick twice per day.  [employer] (ACCU-CHEK SOFTCLIX) lancets Use as instructed  . lisinopril (ZESTRIL) 10 MG tablet Take 1 tablet (10 mg total) by mouth daily. To lower blood pressure  . meloxicam (MOBIC) 15 MG tablet Take 1 tablet (15 mg total) by mouth daily. Prn pain; take after eating  . metFORMIN (GLUCOPHAGE-XR) 500 MG 24 hr tablet TAKE 2 TABLETS BY MOUTH EVERY  DAY WITH BREAKFAST  . methocarbamol (ROBAXIN) 500 MG tablet May take one pill every 8 hours during the day and two pills at bedtime if needed for muscle spasm   Facility-Administered Encounter Medications as of 09/28/2019  Medication  . 0.9 %  sodium chloride infusion    Allergy: No Known Allergies  Social Hx:   Social History   Socioeconomic History  . Marital status: Legally Separated    Spouse name: Not on file  . Number of children: Not on file  . Years of education: Not on file  . Highest education level: Not on file  Occupational History  . Not on file  Tobacco Use  . Smoking status: Never Smoker  . Smokeless tobacco: Never Used  Substance and Sexual Activity  . Alcohol use: No  . Drug use: No  . Sexual activity: Never    Birth control/protection: None  Other Topics Concern  . Not on file  Social History Narrative   1 boy and 1 girl   Resides in  [place] Determinants of Health   Financial Resource Strain:   . Difficulty of Paying Living Expenses: Not on file  Food Insecurity:   . Worried About Charity fundraiser in the Last Year: Not on file  . Ran Out of Food in the Last Year: Not on file  Transportation Needs:   . Lack of Transportation (Medical): Not on file  . Lack of Transportation (Non-Medical): Not on file  Physical Activity:   . Days of Exercise per Week: Not on file  . Minutes of Exercise per Session: Not on file  Stress:   . Feeling of Stress : Not on file  Social Connections:   . Frequency of Communication with Friends and Family: Not on file  . Frequency of Social Gatherings with Friends and Family: Not on file  . Attends Religious Services: Not on file  . Active Member of Clubs or Organizations: Not on file  . Attends Archivist Meetings: Not on file  . Marital Status: Not on file  Intimate Partner Violence:   . Fear of Current or Ex-Partner: Not on file  . Emotionally Abused: Not on file  . Physically Abused: Not on file  . Sexually Abused: Not on file    Past Surgical Hx:  Past Surgical History:  Procedure Laterality Date  . BREAST LUMPECTOMY Left 06/10/2007  . BREAST LUMPECTOMY WITH RADIOACTIVE SEED AND SENTINEL LYMPH NODE BIOPSY Left 06/09/2017   Procedure: BREAST LUMPECTOMY WITH RADIOACTIVE SEED AND SENTINEL LYMPH NODE BIOPSY;  Surgeon: Excell Seltzer, MD;  Location: [facility];  Service: General;  Laterality: Left;  . COLONOSCOPY  11/02/2017   polyps  . LAPAROSCOPIC ASSISTED VAGINAL HYSTERECTOMY N/A 11/04/2017   Procedure: LAPAROSCOPIC ASSISTED VAGINAL HYSTERECTOMY;  Surgeon: [name], MD;  Location: [facility];  Service: Gynecology;  Laterality: N/A;  . LAPAROSCOPIC BILATERAL SALPINGO OOPHERECTOMY Bilateral 11/04/2017   Procedure: LAPAROSCOPIC BILATERAL SALPINGO OOPHORECTOMY;  Surgeon: [name], MD;  Location: [facility];  Service:  Gynecology;  Laterality: Bilateral;  . MULTIPLE TOOTH EXTRACTIONS    . PORTACATH PLACEMENT Right 06/09/2017   Procedure: INSERTION PORT-A-CATH WITH [place];  Surgeon: Excell Seltzer, MD;  Location: [facility];  Service: General;  Laterality: Right;  . TUBAL LIGATION  02/04/2002  . UPPER GI ENDOSCOPY      Past Medical Hx:  Past Medical History:  Diagnosis Date  . Anemia   . Arthritis  knees, elbows  . BRCA2 gene mutation positive 02/17/2018   BRCA2 c.4552del (p.[id number]*25)  Result reported out on 02/15/2018.   . Breast cancer ([place]) 05/2017   left  . Cough 06/03/2017  . Deaf   . Depression   . Dyspnea    states some SOB with ADLs and aanemia   . Family history of breast cancer   . Family history of breast cancer   . Family history of colon cancer   . Genetic testing 02/17/2018   The Common Hereditary Cancer Panel offered by [facility] includes sequencing and/or deletion duplication testing of the following 47 genes: APC, ATM, AXIN2, BARD1, BMPR1A, BRCA1, BRCA2, BRIP1, CDH1, CDKN2A (p14ARF), CDKN2A (p16INK4a), CKD4, CHEK2, CTNNA1, DICER1, EPCAM (Deletion/duplication testing only), GREM1 (promoter region deletion/duplication testing only), KIT, MEN1, MLH1, MSH2, MSH3, MSH6, MU  . High cholesterol   . History of blood transfusion 07/2017   [facility]  . Hypertension   . Non-insulin dependent type 2 diabetes mellitus ([place])   . Runny nose 06/03/2017   clear drainage, per pt.  . SVD (spontaneous vaginal delivery)    x 2    Past Gynecological History:  SVDx 2, s/p hysterectomy Patient's last menstrual period was 07/09/2017 (lmp unknown).  Family Hx:  Family History  Problem Relation Age of Onset  . Hypertension Mother   . Diabetes Father   . Hypertension Sister   . Diabetes Paternal Grandmother   . Colon cancer Paternal Grandmother 37  . CAD Brother   . Diabetes Brother   . Breast cancer Maternal Aunt        dx >50  . Heart attack Paternal Grandfather   . Breast  cancer Maternal Aunt        dx under 63  . Breast cancer Maternal Aunt        dx  under 50    Review of Systems:  Constitutional  Feels well,    ENT Normal appearing ears and nares bilaterally Skin/Breast  No rash, sores, jaundice, itching, dryness Cardiovascular  No chest pain, shortness of breath, or edema  Pulmonary  No cough or wheeze.  Gastro Intestinal  No nausea, vomitting, or diarrhoea. No bright red blood per rectum, no abdominal pain, change in bowel movement, or constipation.  Genito Urinary  No frequency, urgency, dysuria,  Musculo Skeletal  No myalgia, arthralgia, joint swelling or pain  Neurologic  No weakness, numbness, change in gait,  Psychology  No depression, anxiety, insomnia.   Vitals:  Blood pressure 122/70, pulse 100, height 5' 4"  (1.626 m), weight 161 lb 7 oz (73.2 kg), last menstrual period 07/09/2017, SpO2 98 %.  Physical Exam: WD in NAD Neck  Supple NROM, without any enlargements.  Lymph Node Survey No cervical supraclavicular or inguinal adenopathy Cardiovascular  Pulse normal rate, regularity and rhythm. S1 and S2 normal.  Lungs  Clear to auscultation bilateraly, without wheezes/crackles/rhonchi. Good air movement.  Skin  No rash/lesions/breakdown  Psychiatry  Alert and oriented to person, place, and time  Abdomen  Normoactive bowel sounds, abdomen soft, non-tender and obese without evidence of hernia. Soft incisions. No masses. Back No CVA tenderness Genito Urinary  No palpable masses in vagina or pelvis. Rectal  No palpable masses Extremities  No bilateral cyanosis, clubbing or edema.   Thereasa Solo, MD  09/28/2019, 3:20 PM

## 2019-09-28 NOTE — H&P (View-Only) (Signed)
Follow-up Note: Gyn-Onc  Consult was initially requested by Dr. Kennon Rounds for the evaluation of Erin [name] 59 y.o. female  CC:  Chief Complaint  Patient presents with  . 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 Elevated CA-125    Assessment/Plan:  Erin [name]  is a 60 y.o.  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   S/p 6 cycles adjuvant carboplatin/taxol chemotherapy between April 2019-October, 2019.   Complete clinical response. Increasingly elevated CA 125 since September, 2020 without CT (abd/pelvis) evidence of recurrent disease. Will obtain CT chest to rule out occult chest disease.  I counseled Erin [name] that I was concerned about occult cancer that was not apparent on CT imaging given how high her Ca1 25 years.  I am therefore recommending diagnostic laparoscopy with peritoneal biopsies to rule out definitively intraperitoneal recurrence.  I discussed the procedure, and anticipated outpatient status. I discussed the risks of the operation including  bleeding, infection, damage to internal organs (such as bladder,ureters, bowels), blood clot, reoperation and rehospitalization.  If this is negative we can be reassured that she does not have occult cancer that requires intervention at this point.  HPI: Erin [name] is a 59 year old woman who is deaf and is seen in consultation at the request of Dr Kennon Rounds for clinical stage IC fallopian tube cancer.   The patient has a history of ER PR negative HER-2 positive breast cancer on the left side which was diagnosed in August 2018.  It was treated with lumpectomy in September 2018 followed by Adriamycin and Taxol chemotherapy.  She was then placed on Herceptin with a plan to commence radiation therapy in March 2019.  She reported menorrhagia with irregular menses and therefore transvaginal  ultrasound was performed on June 29, 2017.  It showed a uterus measuring 11.8 x 5.2 x 6.3 cm with a septated fluid-filled structure adjacent to the dome of the uterus.  The endometrium was thickened at 10.2 mm the right ovary measured 3.2 x 2.1 x 2.7 cm and the left ovary measured 9.8 x 7.8 x 11.8 cm.  The ovarian tissue is largely occupied by a complex cyst measuring 7.5 x 9.9 x 9.6 cm with a prominent septation.  There was no free fluid identified.  To follow-up with this ultrasound Dr. Kennon Rounds obtained an MRI of the pelvis on September 06, 2017 which revealed a uterus of normal size measuring 7.5 x 5 x 4.5 cm.  There is a bilobed cystic lesion presumably originating from the left adnexa which is different from the ultrasound report of a right adnexal complex cyst.  The cystic complex measures 12.8 x 10.9 x 8 cm.  The there was no nodularity.  The right ovary appeared normal.  It had no worrisome features.  Ca1 25 was drawn which was normal.  The patient was taken to the operating room on November 04, 2017 for laparoscopic assisted vaginal hysterectomy with BSO.  Surgical findings were significant for an 8-week size uterus with normal adnexa on the left and the right adnexal mass measured personally 12 cm.  It felt to be most consistent with a paratubal cyst.  There is normal upper abdomen reported washings were not obtained.  Surgery was uncomplicated.  Postoperative pathology was unexpected with the finding of a 0.6 cm high-grade serous carcinoma in the  right fallopian tube which involved the serosal surface.  The remainder of the entire right fallopian tube was submitted and no additional malignancy was identified.  The the associated ovarian cyst on the right was benign the left adnexa was unremarkable.  The uterus was also normal including the endometrium.  The patient is healed well from her surgery with no complaints.  She has a paternal grandmother who had colon cancer, a maternal half aunt with  history of breast cancer, 2 paternal aunts with breast cancer.  There is no family history of ovarian cancer.  She has had 2 prior NSVDs in the past.  She is otherwise fairly healthy.  She underwent genetic testing in June, 2019 which revealed a deleterious mutation in BRCA 2.   She received 6 cycles of carboplatin and paclitaxel chemotherapy between April, 2019 and October 2019.  She is on Herceptin maintenance therapy for her breast cancer.   She had not had CA 125 checked postop including throughout chemotherapy. CA 125 was drawn as surveillance on 02/27/19 and was 31.  CA 125 was drawn on 06/09/19 and was elevated at 218.  This prompted her to have a CT scan of the abdomen and pelvis with IV contrast which revealed hepatomegaly without biliary duct dilation.  Tiny hiatal hernia.  Small left paramidline fat-containing ventral abdominal wall hernia but no evidence of metastatic disease in the abdomen or pelvis.  Interval Hx:  Repeat Ca1 25 on September 05, 2019 was significantly elevated at 1088.  Due to this elevation c-Met was drawn to rule out occult liver disease as an explanation for her elevated tumor marker, this was normal for normal liver function tests and renal function.  Repeat CT scan of the abdomen and pelvis was performed on September 14, 2019.  This revealed a normal-appearing liver, spleen, tiny hiatal hernia, normal appendix, no pathologically enlarged lymph nodes, no evidence of peritoneal soft tissue thickening or ascites, surgically absent gynecologic organs, small epigastric ventral hernia seen containing fat.  No evidence of recurrent or metastatic carcinoma in the abdomen or pelvis.  She has no symptoms concerning for recurrence.      Current Meds:  Outpatient Encounter Medications as of 09/28/2019  Medication Sig  . Accu-Chek FastClix Lancets MISC Use as instructed. Inject into the skin twice daily  . atorvastatin (LIPITOR) 40 MG tablet Take 1 tablet (40 mg total) by  mouth daily.  . Blood Glucose Calibration (ACCU-CHEK GUIDE CONTROL) LIQD 1 each by In Vitro route once as needed for up to 1 dose.  . cholecalciferol (VITAMIN D) 1000 units tablet Take 1,000 Units by mouth daily.  . ferrous sulfate 325 (65 FE) MG tablet Take 1 tablet (325 mg total) by mouth daily before breakfast.  . gabapentin (NEURONTIN) 100 MG capsule One or two capsules at bedtime as needed for thumb pain (Patient not taking: Reported on 09/05/2019)  . glimepiride (AMARYL) 2 MG tablet Take 1 tablet (2 mg total) by mouth daily with breakfast. To lower blood sugar  . glucose blood (ACCU-CHEK GUIDE) test strip Use as instructed. Check blood glucose by fingerstick twice per day.  [employer] (ACCU-CHEK SOFTCLIX) lancets Use as instructed  . lisinopril (ZESTRIL) 10 MG tablet Take 1 tablet (10 mg total) by mouth daily. To lower blood pressure  . meloxicam (MOBIC) 15 MG tablet Take 1 tablet (15 mg total) by mouth daily. Prn pain; take after eating  . metFORMIN (GLUCOPHAGE-XR) 500 MG 24 hr tablet TAKE 2 TABLETS BY MOUTH EVERY  DAY WITH BREAKFAST  . methocarbamol (ROBAXIN) 500 MG tablet May take one pill every 8 hours during the day and two pills at bedtime if needed for muscle spasm   Facility-Administered Encounter Medications as of 09/28/2019  Medication  . 0.9 %  sodium chloride infusion    Allergy: No Known Allergies  Social Hx:   Social History   Socioeconomic History  . Marital status: Legally Separated    Spouse name: Not on file  . Number of children: Not on file  . Years of education: Not on file  . Highest education level: Not on file  Occupational History  . Not on file  Tobacco Use  . Smoking status: Never Smoker  . Smokeless tobacco: Never Used  Substance and Sexual Activity  . Alcohol use: No  . Drug use: No  . Sexual activity: Never    Birth control/protection: None  Other Topics Concern  . Not on file  Social History Narrative   1 boy and 1 girl   Resides in  [place] Determinants of Health   Financial Resource Strain:   . Difficulty of Paying Living Expenses: Not on file  Food Insecurity:   . Worried About Charity fundraiser in the Last Year: Not on file  . Ran Out of Food in the Last Year: Not on file  Transportation Needs:   . Lack of Transportation (Medical): Not on file  . Lack of Transportation (Non-Medical): Not on file  Physical Activity:   . Days of Exercise per Week: Not on file  . Minutes of Exercise per Session: Not on file  Stress:   . Feeling of Stress : Not on file  Social Connections:   . Frequency of Communication with Friends and Family: Not on file  . Frequency of Social Gatherings with Friends and Family: Not on file  . Attends Religious Services: Not on file  . Active Member of Clubs or Organizations: Not on file  . Attends Archivist Meetings: Not on file  . Marital Status: Not on file  Intimate Partner Violence:   . Fear of Current or Ex-Partner: Not on file  . Emotionally Abused: Not on file  . Physically Abused: Not on file  . Sexually Abused: Not on file    Past Surgical Hx:  Past Surgical History:  Procedure Laterality Date  . BREAST LUMPECTOMY Left 06/10/2007  . BREAST LUMPECTOMY WITH RADIOACTIVE SEED AND SENTINEL LYMPH NODE BIOPSY Left 06/09/2017   Procedure: BREAST LUMPECTOMY WITH RADIOACTIVE SEED AND SENTINEL LYMPH NODE BIOPSY;  Surgeon: Excell Seltzer, MD;  Location: [facility];  Service: General;  Laterality: Left;  . COLONOSCOPY  11/02/2017   polyps  . LAPAROSCOPIC ASSISTED VAGINAL HYSTERECTOMY N/A 11/04/2017   Procedure: LAPAROSCOPIC ASSISTED VAGINAL HYSTERECTOMY;  Surgeon: [name], MD;  Location: [facility];  Service: Gynecology;  Laterality: N/A;  . LAPAROSCOPIC BILATERAL SALPINGO OOPHERECTOMY Bilateral 11/04/2017   Procedure: LAPAROSCOPIC BILATERAL SALPINGO OOPHORECTOMY;  Surgeon: [name], MD;  Location: [facility];  Service:  Gynecology;  Laterality: Bilateral;  . MULTIPLE TOOTH EXTRACTIONS    . PORTACATH PLACEMENT Right 06/09/2017   Procedure: INSERTION PORT-A-CATH WITH [place];  Surgeon: Excell Seltzer, MD;  Location: [facility];  Service: General;  Laterality: Right;  . TUBAL LIGATION  02/04/2002  . UPPER GI ENDOSCOPY      Past Medical Hx:  Past Medical History:  Diagnosis Date  . Anemia   . Arthritis  knees, elbows  . BRCA2 gene mutation positive 02/17/2018   BRCA2 c.4552del (p.Glu1518Asnfs*25)  Result reported out on 02/15/2018.   . Breast cancer ([place]) 05/2017   left  . Cough 06/03/2017  . Deaf   . Depression   . Dyspnea    states some SOB with ADLs and aanemia   . Family history of breast cancer   . Family history of breast cancer   . Family history of colon cancer   . Genetic testing 02/17/2018   The Common Hereditary Cancer Panel offered by [facility] includes sequencing and/or deletion duplication testing of the following 47 genes: APC, ATM, AXIN2, BARD1, BMPR1A, BRCA1, BRCA2, BRIP1, CDH1, CDKN2A (p14ARF), CDKN2A (p16INK4a), CKD4, CHEK2, CTNNA1, DICER1, EPCAM (Deletion/duplication testing only), GREM1 (promoter region deletion/duplication testing only), KIT, MEN1, MLH1, MSH2, MSH3, MSH6, MU  . High cholesterol   . History of blood transfusion 07/2017   [facility]  . Hypertension   . Non-insulin dependent type 2 diabetes mellitus ([place])   . Runny nose 06/03/2017   clear drainage, per pt.  . SVD (spontaneous vaginal delivery)    x 2    Past Gynecological History:  SVDx 2, s/p hysterectomy Patient's last menstrual period was 07/09/2017 (lmp unknown).  Family Hx:  Family History  Problem Relation Age of Onset  . Hypertension Mother   . Diabetes Father   . Hypertension Sister   . Diabetes Paternal Grandmother   . Colon cancer Paternal Grandmother 102  . CAD Brother   . Diabetes Brother   . Breast cancer Maternal Aunt        dx >50  . Heart attack Paternal Grandfather   . Breast  cancer Maternal Aunt        dx under 26  . Breast cancer Maternal Aunt        dx  under 50    Review of Systems:  Constitutional  Feels well,    ENT Normal appearing ears and nares bilaterally Skin/Breast  No rash, sores, jaundice, itching, dryness Cardiovascular  No chest pain, shortness of breath, or edema  Pulmonary  No cough or wheeze.  Gastro Intestinal  No nausea, vomitting, or diarrhoea. No bright red blood per rectum, no abdominal pain, change in bowel movement, or constipation.  Genito Urinary  No frequency, urgency, dysuria,  Musculo Skeletal  No myalgia, arthralgia, joint swelling or pain  Neurologic  No weakness, numbness, change in gait,  Psychology  No depression, anxiety, insomnia.   Vitals:  Blood pressure 122/70, pulse 100, height 5' 4"  (1.626 m), weight 161 lb 7 oz (73.2 kg), last menstrual period 07/09/2017, SpO2 98 %.  Physical Exam: WD in NAD Neck  Supple NROM, without any enlargements.  Lymph Node Survey No cervical supraclavicular or inguinal adenopathy Cardiovascular  Pulse normal rate, regularity and rhythm. S1 and S2 normal.  Lungs  Clear to auscultation bilateraly, without wheezes/crackles/rhonchi. Good air movement.  Skin  No rash/lesions/breakdown  Psychiatry  Alert and oriented to person, place, and time  Abdomen  Normoactive bowel sounds, abdomen soft, non-tender and obese without evidence of hernia. Soft incisions. No masses. Back No CVA tenderness Genito Urinary  No palpable masses in vagina or pelvis. Rectal  No palpable masses Extremities  No bilateral cyanosis, clubbing or edema.   Thereasa Solo, MD  09/28/2019, 3:20 PM

## 2019-09-28 NOTE — Patient Instructions (Addendum)
Preparing for your Surgery  Plan for surgery on October 09, 2019 with Dr. [name] at [facility]. You will be scheduled for a diagnostic laparoscopy, peritoneal biopsy.   STOP MOBIC NOW.  Pre-operative Testing -You will receive a phone call from presurgical testing at [facility]. They will either have you come in for your appointment or do it over the phone.    -Bring your insurance card, copy of an advanced directive if applicable, medication list.  -At that visit, you will be asked to sign a consent for a possible blood transfusion in case a transfusion becomes necessary during surgery.  The need for a blood transfusion is rare but having consent is a necessary part of your care.     -You should not be taking blood thinners or aspirin at least ten days prior to surgery unless instructed by your surgeon.  -Do not take supplements such as fish oil (omega 3), red yeast rice, tumeric before your surgery.   Day Before Surgery at [place] will be asked to take in a light diet the day before surgery.  Avoid carbonated beverages.  You will be advised to have nothing to eat or drink after midnight the evening before.    Eat a light diet the day before surgery.  Examples including soups, broths, toast, yogurt, mashed potatoes.  Things to avoid include carbonated beverages (fizzy beverages), raw fruits and raw vegetables, or beans.   If your bowels are filled with gas, your surgeon will have difficulty visualizing your pelvic organs which increases your surgical risks.  Your role in recovery Your role is to become active as soon as directed by your doctor, while still giving yourself time to heal.  Rest when you feel tired. You will be asked to do the following in order to speed your recovery:  - Cough and breathe deeply. This helps to clear and expand your lungs and can prevent pneumonia after surgery.  - [place]. Do mild physical activity.  Walking or moving your legs help your circulation and body functions return to normal. Do not try to get up or walk alone the first time after surgery.   -If you develop swelling on one leg or the other, pain in the back of your leg, redness/warmth in one of your legs, please call the office or go to the Emergency Room to have a doppler to rule out a blood clot. For shortness of breath, chest pain-seek care in the Emergency Room as soon as possible. - Actively manage your pain. Managing your pain lets you move in comfort. We will ask you to rate your pain on a scale of zero to 10. It is your responsibility to tell your doctor or nurse where and how much you hurt so your pain can be treated.  Special Considerations -Your final pathology results from surgery should be available around one week after surgery and the results will be relayed to you when available.  -FMLA forms can be faxed to [phone] and please allow 5-7 business days for completion.  Pain Management After Surgery -You have been prescribed your pain medication and bowel regimen medications before surgery so that you can have these available when you are discharged from the hospital. The pain medication is for use ONLY AFTER surgery and a new prescription will not be given.   -Make sure that you have Tylenol and Ibuprofen at home to use on a regular basis after  surgery for pain control. We recommend alternating the medications every hour to six hours since they work differently and are processed in the body differently for pain relief.  -Review the attached handout on narcotic use and their risks and side effects.   Bowel Regimen -You have been prescribed Sennakot-S to take nightly to prevent constipation especially if you are taking the narcotic pain medication intermittently.  It is important to prevent constipation and drink adequate amounts of liquids. You can stop taking this medication when you are not taking pain medication  and you are back on your normal bowel routine.   Blood Transfusion Information (For the consent to be signed before surgery)  We will be checking your blood type before surgery so in case of emergencies, we will know what type of blood you would need.                                            WHAT IS A BLOOD TRANSFUSION?  A transfusion is the replacement of blood or some of its parts. Blood is made up of multiple cells which provide different functions.  Red blood cells carry oxygen and are used for blood loss replacement.  White blood cells fight against infection.  Platelets control bleeding.  Plasma helps clot blood.  Other blood products are available for specialized needs, such as hemophilia or other clotting disorders. BEFORE THE TRANSFUSION  Who gives blood for transfusions?   You may be able to donate blood to be used at a later date on yourself (autologous donation).  Relatives can be asked to donate blood. This is generally not any safer than if you have received blood from a stranger. The same precautions are taken to ensure safety when a relative's blood is donated.  Healthy volunteers who are fully evaluated to make sure their blood is safe. This is blood bank blood. Transfusion therapy is the safest it has ever been in the practice of medicine. Before blood is taken from a donor, a complete history is taken to make sure that person has no history of diseases nor engages in risky social behavior (examples are intravenous drug use or sexual activity with multiple partners). The donor's travel history is screened to minimize risk of transmitting infections, such as malaria. The donated blood is tested for signs of infectious diseases, such as HIV and hepatitis. The blood is then tested to be sure it is compatible with you in order to minimize the chance of a transfusion reaction. If you or a relative donates blood, this is often done in anticipation of surgery and is not  appropriate for emergency situations. It takes many days to process the donated blood. RISKS AND COMPLICATIONS Although transfusion therapy is very safe and saves many lives, the main dangers of transfusion include:   Getting an infectious disease.  Developing a transfusion reaction. This is an allergic reaction to something in the blood you were given. Every precaution is taken to prevent this. The decision to have a blood transfusion has been considered carefully by your caregiver before blood is given. Blood is not given unless the benefits outweigh the risks.  AFTER SURGERY INSTRUCTIONS  Return to work: 4-6 weeks if applicable  Activity: 1. Be up and out of the bed during the day.  Take a nap if needed.  You may walk up steps but be careful and  use the hand rail.  Stair climbing will tire you more than you think, you may need to stop part way and rest.   2. No lifting or straining for 4 weeks over 10 pounds. No pushing, pulling, straining for 6 weeks.  3. No driving for 48 hours(s).  Do not drive if you are taking narcotic pain medicine.   4. You can shower as soon as the next day after surgery. Shower daily.  Use soap and water on your incision and pat dry; don't rub.  No tub baths or submerging your body in water until cleared by your surgeon.   5. No sexual activity and nothing in the vagina for 2 weeks.  6. You may experience a small amount of clear drainage from your incisions, which is normal.  If the drainage persists, increases, or changes color please call the office.  7. Do not use creams, lotions, or ointments such as neosporin on your incisions after surgery until advised by your surgeon because they can cause removal of the dermabond glue on your incisions.    8. Take Tylenol or ibuprofen first for pain and only use narcotic pain medication for severe pain not relieved by the Tylenol or Ibuprofen.  Monitor your Tylenol intake to a max of 4,000 mg.  Diet: 1. Low sodium  Heart Healthy Diet is recommended.  2. It is safe to use a laxative, such as Miralax or Colace, if you have difficulty moving your bowels. You can take Sennakot at bedtime every evening to keep bowel movements regular and to prevent constipation.    Wound Care: 1. Keep clean and dry.  Shower daily.  Reasons to call the Doctor:  Fever - Oral temperature greater than 100.4 degrees Fahrenheit  Foul-smelling vaginal discharge  Difficulty urinating  Nausea and vomiting  Increased pain at the site of the incision that is unrelieved with pain medicine.  Difficulty breathing with or without chest pain  New calf pain especially if only on one side  Sudden, continuing increased vaginal bleeding with or without clots.   Contacts: For questions or concerns you should contact:  Dr. [name] at [phone]  Joylene John, NP at [phone]  After Hours: call [phone] and have the GYN Oncologist paged/contacted

## 2019-09-29 ENCOUNTER — Other Ambulatory Visit: Payer: Self-pay | Admitting: Gynecologic Oncology

## 2019-09-29 ENCOUNTER — Telehealth: Payer: Self-pay | Admitting: Gynecologic Oncology

## 2019-09-29 DIAGNOSIS — C5701 Malignant neoplasm of right fallopian tube: Secondary | ICD-10-CM

## 2019-09-29 NOTE — Telephone Encounter (Signed)
Called the patient's mother at the patient's request to discuss upcoming appointments and surgery on Jan 25.  Discussed in detail.  No concerns or questions voiced. Advised to call for any needs or concerns.

## 2019-10-02 ENCOUNTER — Encounter: Payer: Self-pay | Admitting: Family

## 2019-10-02 ENCOUNTER — Other Ambulatory Visit: Payer: Self-pay

## 2019-10-02 ENCOUNTER — Ambulatory Visit: Payer: Medicaid Other | Attending: Family | Admitting: Family

## 2019-10-02 VITALS — BP 128/76 | HR 94 | Temp 98.3°F | Resp 16 | Wt 162.0 lb

## 2019-10-02 DIAGNOSIS — Z79899 Other long term (current) drug therapy: Secondary | ICD-10-CM | POA: Insufficient documentation

## 2019-10-02 DIAGNOSIS — D509 Iron deficiency anemia, unspecified: Secondary | ICD-10-CM | POA: Diagnosis not present

## 2019-10-02 DIAGNOSIS — Z853 Personal history of malignant neoplasm of breast: Secondary | ICD-10-CM | POA: Insufficient documentation

## 2019-10-02 DIAGNOSIS — E119 Type 2 diabetes mellitus without complications: Secondary | ICD-10-CM | POA: Diagnosis not present

## 2019-10-02 DIAGNOSIS — Z90722 Acquired absence of ovaries, bilateral: Secondary | ICD-10-CM | POA: Diagnosis not present

## 2019-10-02 DIAGNOSIS — R252 Cramp and spasm: Secondary | ICD-10-CM | POA: Insufficient documentation

## 2019-10-02 DIAGNOSIS — Z791 Long term (current) use of non-steroidal anti-inflammatories (NSAID): Secondary | ICD-10-CM | POA: Insufficient documentation

## 2019-10-02 DIAGNOSIS — M62830 Muscle spasm of back: Secondary | ICD-10-CM

## 2019-10-02 DIAGNOSIS — Z9851 Tubal ligation status: Secondary | ICD-10-CM | POA: Insufficient documentation

## 2019-10-02 DIAGNOSIS — Z9012 Acquired absence of left breast and nipple: Secondary | ICD-10-CM | POA: Diagnosis not present

## 2019-10-02 DIAGNOSIS — Z800 Family history of malignant neoplasm of digestive organs: Secondary | ICD-10-CM | POA: Diagnosis not present

## 2019-10-02 DIAGNOSIS — Z7984 Long term (current) use of oral hypoglycemic drugs: Secondary | ICD-10-CM | POA: Insufficient documentation

## 2019-10-02 DIAGNOSIS — Z833 Family history of diabetes mellitus: Secondary | ICD-10-CM | POA: Insufficient documentation

## 2019-10-02 DIAGNOSIS — Z803 Family history of malignant neoplasm of breast: Secondary | ICD-10-CM | POA: Insufficient documentation

## 2019-10-02 MED ORDER — METHOCARBAMOL 500 MG PO TABS: ORAL_TABLET | ORAL | 0 refills | Status: DC

## 2019-10-02 NOTE — Progress Notes (Signed)
Pt states her body is tired  Pt states she has tried coffee and prune juice to help with bowel movements  Pt states she was given muscle relaxer's and it has helped   Pt states she feels the cramp in her left leg the most

## 2019-10-02 NOTE — Progress Notes (Signed)
Patient ID: Erin [name], female    DOB: 12/21/1960  MRN: [id number]  CC: Leg Cramps and Back Spasms  Subjective: Erin [name] is a 59 y.o. female with primary history of diabetes, chronic right-sided low back pain without sciatica, and breast cancer who presents for leg cramps and back spasms. Accompanied by interpreter Carlisle Cater.  1. LEG CRAMPS: Left leg cramps since 2019. Was taking Tylenol PM, to help with sleep, and it did help somewhat. Was told by oncologist not to not take Tylenol PM was told to take regular Tylenol. Has not started taking Tylenol as she just purchased medication on yesterday. Gets around 8 hours of sleep per night. Denies thirst. Denies sore throat. Denies dizziness. Denies chest pain. Denies shortness of breath. Denies abdominal pain. Denies leg swelling. Admits normal range of motion with no limitations. Pain is sharp and hard to walk sometimes related to cramps. Standing, walking, and sitting in low chairs make it worse. Pain and cramps are not constant. 3/10 is pain rating on a typical day. Wear a variety of shoes and boots. Reports sneakers don't have enough support for her.  2. BACK SPASMS:  Reports  Dr. Chapman Fitch referred her for a CT scan of the back. Reports CT returned normal. Muscle relaxant does help with muscle spasms and sleeping throughout the night. Standing aggravates back spasms. Denies change in bowel and bladder movements. Admits fatigue. Location: left middle back Onset: 2019 Description: grinding or squeezing spasm, not constant throughout day Modifying factors: none able to complete activities of daily living  Symptoms Worse with: standing Better with: muscle relaxant, Robaxin Trauma: Fall in November, caught herself, no injury  [employer] Fecal/urinary incontinence: No Weakness: No Fever/chills: No  Night pain: Not if taking muscle relaxant and Tylenol Unexplained weight loss: No No relief with bedrest: Yes Cancer/immunosuppression:  Yes IV drug use: No PMH of osteoporosis or chronic steroid use: No   Patient Active Problem List   Diagnosis Date Noted  . Paresthesia 04/17/2019  . BRCA2 gene mutation positive 02/17/2018  . Genetic testing 02/17/2018  . Family history of breast cancer   . Family history of colon cancer   . Fallopian tube cancer, carcinoma, right ([place]) 11/22/2017  . S/P vaginal hysterectomy 11/04/2017  . Ovarian mass, left 08/26/2017  . Menorrhagia 08/26/2017  . Port-A-Cath in place 07/23/2017  . Malignant neoplasm of lower-outer quadrant of left breast of female, estrogen receptor negative ([place]) 05/13/2017  . Abdominal bloating 04/02/2017  . Chronic right-sided low back pain without sciatica 07/02/2016  . DM type 2 (diabetes mellitus, type 2) ([place]) 11/18/2014  . Intermittent constipation 06/06/2010  . UNSPECIFIED IRON DEFICIENCY ANEMIA 04/22/2010  . DEPRESSION 04/10/2010  . Hearing loss 04/10/2010     Current Outpatient Medications on File Prior to Visit  Medication Sig Dispense Refill  . Accu-Chek FastClix Lancets MISC Use as instructed. Inject into the skin twice daily 100 each 3  . atorvastatin (LIPITOR) 40 MG tablet Take 1 tablet (40 mg total) by mouth daily. 90 tablet 1  . Blood Glucose Calibration (ACCU-CHEK GUIDE CONTROL) LIQD 1 each by In Vitro route once as needed for up to 1 dose. 1 each 0  . cholecalciferol (VITAMIN D) 1000 units tablet Take 1,000 Units by mouth daily.    . ferrous sulfate 325 (65 FE) MG tablet Take 1 tablet (325 mg total) by mouth daily before breakfast. 90 tablet 1  . gabapentin (NEURONTIN) 100 MG capsule One or two capsules at bedtime  as needed for thumb pain (Patient not taking: Reported on 09/05/2019) 60 capsule 6  . glimepiride (AMARYL) 2 MG tablet Take 1 tablet (2 mg total) by mouth daily with breakfast. To lower blood sugar 90 tablet 3  . glucose blood (ACCU-CHEK GUIDE) test strip Use as instructed. Check blood glucose by fingerstick twice per day. 100 each 12   . ibuprofen (ADVIL) 800 MG tablet Take 1 tablet (800 mg total) by mouth every 8 (eight) hours as needed for moderate pain. For AFTER surgery only 30 tablet 0  . Lancet Devices (ACCU-CHEK SOFTCLIX) lancets Use as instructed 1 each 0  . lisinopril (ZESTRIL) 10 MG tablet Take 1 tablet (10 mg total) by mouth daily. To lower blood pressure 90 tablet 3  . meloxicam (MOBIC) 15 MG tablet Take 1 tablet (15 mg total) by mouth daily. Prn pain; take after eating 30 tablet 3  . metFORMIN (GLUCOPHAGE-XR) 500 MG 24 hr tablet TAKE 2 TABLETS BY MOUTH EVERY DAY WITH BREAKFAST 180 tablet 0  . methocarbamol (ROBAXIN) 500 MG tablet May take one pill every 8 hours during the day and two pills at bedtime if needed for muscle spasm 90 tablet 0  . oxyCODONE (OXY IR/ROXICODONE) 5 MG immediate release tablet Take 1 tablet (5 mg total) by mouth every 4 (four) hours as needed for severe pain. For AFTER surgery only, do not take and drive 10 tablet 0  . senna-docusate (SENOKOT-S) 8.6-50 MG tablet Take 2 tablets by mouth at bedtime. For AFTER surgery, do not take if having diarrhea 30 tablet 1   Current Facility-Administered Medications on File Prior to Visit  Medication Dose Route Frequency Provider Last Rate Last Admin  . 0.9 %  sodium chloride infusion  500 mL Intravenous Once Nandigam, Venia Minks, MD        No Known Allergies  Social History   Socioeconomic History  . Marital status: Legally Separated    Spouse name: Not on file  . Number of children: Not on file  . Years of education: Not on file  . Highest education level: Not on file  Occupational History  . Not on file  Tobacco Use  . Smoking status: Never Smoker  . Smokeless tobacco: Never Used  Substance and Sexual Activity  . Alcohol use: No  . Drug use: No  . Sexual activity: Never    Birth control/protection: None  Other Topics Concern  . Not on file  Social History Narrative   1 boy and 1 girl   Resides in [place]  Determinants of Health   Financial Resource Strain:   . Difficulty of Paying Living Expenses: Not on file  Food Insecurity:   . Worried About Charity fundraiser in the Last Year: Not on file  . Ran Out of Food in the Last Year: Not on file  Transportation Needs:   . Lack of Transportation (Medical): Not on file  . Lack of Transportation (Non-Medical): Not on file  Physical Activity:   . Days of Exercise per Week: Not on file  . Minutes of Exercise per Session: Not on file  Stress:   . Feeling of Stress : Not on file  Social Connections:   . Frequency of Communication with Friends and Family: Not on file  . Frequency of Social Gatherings with Friends and Family: Not on file  . Attends Religious Services: Not on file  . Active Member of Clubs or Organizations: Not on file  .  Attends Archivist Meetings: Not on file  . Marital Status: Not on file  Intimate Partner Violence:   . Fear of Current or Ex-Partner: Not on file  . Emotionally Abused: Not on file  . Physically Abused: Not on file  . Sexually Abused: Not on file    Family History  Problem Relation Age of Onset  . Hypertension Mother   . Diabetes Father   . Hypertension Sister   . Diabetes Paternal Grandmother   . Colon cancer Paternal Grandmother 58  . CAD Brother   . Diabetes Brother   . Breast cancer Maternal Aunt        dx >50  . Heart attack Paternal Grandfather   . Breast cancer Maternal Aunt        dx under 22  . Breast cancer Maternal Aunt        dx  under 32    Past Surgical History:  Procedure Laterality Date  . BREAST LUMPECTOMY Left 06/10/2007  . BREAST LUMPECTOMY WITH RADIOACTIVE SEED AND SENTINEL LYMPH NODE BIOPSY Left 06/09/2017   Procedure: BREAST LUMPECTOMY WITH RADIOACTIVE SEED AND SENTINEL LYMPH NODE BIOPSY;  Surgeon: Excell Seltzer, MD;  Location: [facility];  Service: General;  Laterality: Left;  . COLONOSCOPY  11/02/2017   polyps  . LAPAROSCOPIC ASSISTED  VAGINAL HYSTERECTOMY N/A 11/04/2017   Procedure: LAPAROSCOPIC ASSISTED VAGINAL HYSTERECTOMY;  Surgeon: [name], MD;  Location: [facility];  Service: Gynecology;  Laterality: N/A;  . LAPAROSCOPIC BILATERAL SALPINGO OOPHERECTOMY Bilateral 11/04/2017   Procedure: LAPAROSCOPIC BILATERAL SALPINGO OOPHORECTOMY;  Surgeon: [name], MD;  Location: [facility];  Service: Gynecology;  Laterality: Bilateral;  . MULTIPLE TOOTH EXTRACTIONS    . PORTACATH PLACEMENT Right 06/09/2017   Procedure: INSERTION PORT-A-CATH WITH [place];  Surgeon: Excell Seltzer, MD;  Location: [facility];  Service: General;  Laterality: Right;  . TUBAL LIGATION  02/04/2002  . UPPER GI ENDOSCOPY      ROS: Review of Systems Negative except as stated above  PHYSICAL EXAM: LMP 07/09/2017 (LMP Unknown)   Physical Exam General appearance - alert, well appearing, and in no distress and oriented to person, place, and time Chest - clear to auscultation, no wheezes, rales or rhonchi, symmetric air entry, no tachypnea, retractions or cyanosis Heart - normal rate, regular rhythm, normal S1, S2, no murmurs, rubs, clicks or gallops, normal rate and regular rhythm, S1 and S2 normal, no murmurs noted, no gallops noted Back exam - full range of motion, no tenderness, palpable spasm or pain on motion Neurological - alert, oriented, normal speech, no focal findings or movement disorder noted Musculoskeletal - no joint tenderness, deformity or swelling, no muscular tenderness noted, full range of motion without pain  CMP Latest Ref Rng & Units 09/06/2019 06/13/2019 04/06/2019  Glucose 70 - 99 mg/dL 97 90 101(H)  BUN 6 - 20 mg/dL 14 6 9   Creatinine 0.44 - 1.00 mg/dL 0.77 0.73 0.75  Sodium 135 - 145 mmol/L 141 142 143  Potassium 3.5 - 5.1 mmol/L 4.1 4.0 4.8  Chloride 98 - 111 mmol/L 105 107 102  CO2 22 - 32 mmol/L 25 25 24   Calcium 8.9 - 10.3 mg/dL 9.2 9.4 9.7  Total Protein 6.5 - 8.1 g/dL 7.5 - 7.5  Total Bilirubin 0.3 - 1.2  mg/dL 0.2(L) - 0.3  Alkaline Phos 38 - 126 U/L 96 - 101  AST 15 - 41 U/L 18 - 18  ALT 0 - 44 U/L 22 -  22   Lipid Panel     Component Value Date/Time   CHOL 104 04/06/2019 1211   TRIG 72 04/06/2019 1211   HDL 37 (L) 04/06/2019 1211   CHOLHDL 2.8 04/06/2019 1211   CHOLHDL 4.3 11/14/2014 0914   VLDL 21 11/14/2014 0914   LDLCALC 53 04/06/2019 1211    CBC    Component Value Date/Time   WBC 10.5 12/02/2018 1751   RBC 3.68 (L) 12/02/2018 1751   HGB 11.4 (L) 12/02/2018 1751   HGB 11.6 (L) 10/04/2018 0933   HGB 9.3 (L) 09/15/2017 0800   HCT 36.0 12/02/2018 1751   HCT 28.2 (L) 09/15/2017 0800   PLT 283 12/02/2018 1751   PLT 244 10/04/2018 0933   PLT 357 09/15/2017 0800   PLT 375 05/26/2017 0918   MCV 97.8 12/02/2018 1751   MCV 82.4 09/15/2017 0800   MCH 31.0 12/02/2018 1751   MCHC 31.7 12/02/2018 1751   RDW 14.0 12/02/2018 1751   RDW 20.8 (H) 09/15/2017 0800   LYMPHSABS 3.0 12/02/2018 1751   LYMPHSABS 2.5 09/15/2017 0800   MONOABS 0.7 12/02/2018 1751   MONOABS 0.8 09/15/2017 0800   EOSABS 0.1 12/02/2018 1751   EOSABS 0.1 09/15/2017 0800   EOSABS 0.1 05/26/2017 0918   BASOSABS 0.0 12/02/2018 1751   BASOSABS 0.0 09/15/2017 0800    ASSESSMENT AND PLAN: 1. LEG CRAMPS:  -Continue Methocarbamol (ROBAXIN); Take 1 pill (500 mg total) every 8 (eight) hours during the day and two pills at night as needed for leg cramps -Leg exercises to stretch the muscle with exercise band -Stand flat on feet and press for additional relief of leg spasms -Warm shower or bath with steam for 5 minutes to decrease leg spasms -Ice massage as needed -Vitamin B12 lab checked in clinic today, pending  2. BACK SPASMS:  -Continue Methocarbamol (ROBAXIN); Take 1 pill (500 mg total) every 8 (eight) hours during the day and two pills at night as needed for back spasms; Dispense: 90 tablets; Refills: 0  3. IRON DEFICIENCY ANEMIA:  -Continue ferrous sulfate (65 FE); Take 1 tablet (325 mg total) by mouth  daily before breakfast -Ferrous sulfate may assist with leg cramps in relation to anemia -Reports that she has enough of this prescription to last for a couple months   Patient was given clear instructions to go to Emergency Department or return to medical center if symptoms don't improve, worsen, or new problems develop.The patient verbalized understanding. Patient was given the opportunity to ask questions.  Patient verbalized understanding of the plan and was able to repeat key elements of the plan.   No orders of the defined types were placed in this encounter.    Requested Prescriptions    No prescriptions requested or ordered in this encounter    No follow-ups on file.  Camillia Herter, NP

## 2019-10-02 NOTE — Patient Instructions (Addendum)
Continue Robaxin. Continue ferrous sulfate. Vitamin B12 drawn today. Follow-up as needed with primary physician. Leg Cramps Leg cramps occur when one or more muscles tighten and you have no control over this tightening (involuntary muscle contraction). Muscle cramps can develop in any muscle, but the most common place is in the calf muscles of the leg. Those cramps can occur during exercise or when you are at rest. Leg cramps are painful, and they may last for a few seconds to a few minutes. Cramps may return several times before they finally stop. Usually, leg cramps are not caused by a serious medical problem. In many cases, the cause is not known. Some common causes include:  Excessive physical effort (overexertion), such as during intense exercise.  Overuse from repetitive motions, or doing the same thing over and over.  Staying in a certain position for a long period of time.  Improper preparation, form, or technique while performing a sport or an activity.  Dehydration.  Injury.  Side effects of certain medicines.  Abnormally low levels of minerals in your blood (electrolytes), especially potassium and calcium. This could result from: ? Pregnancy. ? Taking diuretic medicines. Follow these instructions at home: Eating and drinking  Drink enough fluid to keep your urine pale yellow. Staying hydrated may help prevent cramps.  Eat a healthy diet that includes plenty of nutrients to help your muscles function. A healthy diet includes fruits and vegetables, lean protein, whole grains, and low-fat or nonfat dairy products. Managing pain, stiffness, and swelling      Try massaging, stretching, and relaxing the affected muscle. Do this for several minutes at a time.  If directed, put ice on areas that are sore or painful after a cramp: ? Put ice in a plastic bag. ? Place a towel between your skin and the bag. ? Leave the ice on for 20 minutes, 2-3 times a day.  If directed, apply  heat to muscles that are tense or tight. Do this before you exercise, or as often as told by your health care provider. Use the heat source that your health care provider recommends, such as a moist heat pack or a heating pad. ? Place a towel between your skin and the heat source. ? Leave the heat on for 20-30 minutes. ? Remove the heat if your skin turns bright red. This is especially important if you are unable to feel pain, heat, or cold. You may have a greater risk of getting burned.  Try taking hot showers or baths to help relax tight muscles. General instructions  If you are having frequent leg cramps, avoid intense exercise for several days.  Take over-the-counter and prescription medicines only as told by your health care provider.  Keep all follow-up visits as told by your health care provider. This is important. Contact a health care provider if:  Your leg cramps get more severe or more frequent, or they do not improve over time.  Your foot becomes cold, numb, or blue. Summary  Muscle cramps can develop in any muscle, but the most common place is in the calf muscles of the leg.  Leg cramps are painful, and they may last for a few seconds to a few minutes.  Usually, leg cramps are not caused by a serious medical problem. Often, the cause is not known.  Stay hydrated and take over-the-counter and prescription medicines only as told by your health care provider. This information is not intended to replace advice given to you by your  health care provider. Make sure you discuss any questions you have with your health care provider. Document Revised: 08/13/2017 Document Reviewed: 06/10/2017 Elsevier Patient Education  2020 [employer].

## 2019-10-03 ENCOUNTER — Other Ambulatory Visit: Payer: Self-pay

## 2019-10-03 ENCOUNTER — Encounter: Payer: Self-pay | Admitting: Hematology and Oncology

## 2019-10-03 ENCOUNTER — Ambulatory Visit (HOSPITAL_COMMUNITY)
Admission: RE | Admit: 2019-10-03 | Discharge: 2019-10-03 | Disposition: A | Discharge status: Home / Self Care | Payer: Medicaid Other | Source: Ambulatory Visit | Attending: Gynecologic Oncology | Admitting: Gynecologic Oncology

## 2019-10-03 DIAGNOSIS — C50512 Malignant neoplasm of lower-outer quadrant of left female breast: Secondary | ICD-10-CM

## 2019-10-03 DIAGNOSIS — C5701 Malignant neoplasm of right fallopian tube: Secondary | ICD-10-CM | POA: Diagnosis present

## 2019-10-03 DIAGNOSIS — R971 Elevated cancer antigen 125 [CA 125]: Secondary | ICD-10-CM | POA: Diagnosis present

## 2019-10-03 DIAGNOSIS — N6310 Unspecified lump in the right breast, unspecified quadrant: Secondary | ICD-10-CM

## 2019-10-03 DIAGNOSIS — Z1501 Genetic susceptibility to malignant neoplasm of breast: Secondary | ICD-10-CM

## 2019-10-03 LAB — VITAMIN B12: Vitamin B-12: 1770 pg/mL — ABNORMAL HIGH (ref 232–1245)

## 2019-10-03 MED ORDER — IOHEXOL 300 MG/ML  SOLN
75.0000 mL | Freq: Once | INTRAMUSCULAR | Status: AC | PRN
  Administered 2019-10-03: 15:00:00 75 mL via INTRAVENOUS

## 2019-10-03 MED ORDER — SODIUM CHLORIDE (PF) 0.9 % IJ SOLN
INTRAMUSCULAR | Status: AC
  Filled 2019-10-03: qty 50

## 2019-10-05 ENCOUNTER — Other Ambulatory Visit: Payer: Self-pay

## 2019-10-05 ENCOUNTER — Other Ambulatory Visit (HOSPITAL_COMMUNITY)
Admission: RE | Admit: 2019-10-05 | Discharge: 2019-10-05 | Disposition: A | Discharge status: Home / Self Care | Payer: Medicaid Other | Source: Ambulatory Visit | Attending: Gynecologic Oncology | Admitting: Gynecologic Oncology

## 2019-10-05 ENCOUNTER — Encounter (HOSPITAL_BASED_OUTPATIENT_CLINIC_OR_DEPARTMENT_OTHER): Payer: Self-pay | Admitting: Gynecologic Oncology

## 2019-10-05 DIAGNOSIS — Z20822 Contact with and (suspected) exposure to covid-19: Secondary | ICD-10-CM | POA: Diagnosis not present

## 2019-10-05 DIAGNOSIS — Z8543 Personal history of malignant neoplasm of ovary: Secondary | ICD-10-CM | POA: Insufficient documentation

## 2019-10-05 DIAGNOSIS — R971 Elevated cancer antigen 125 [CA 125]: Secondary | ICD-10-CM | POA: Diagnosis not present

## 2019-10-05 DIAGNOSIS — Z01812 Encounter for preprocedural laboratory examination: Secondary | ICD-10-CM | POA: Insufficient documentation

## 2019-10-05 LAB — SARS CORONAVIRUS 2 (TAT 6-24 HRS): SARS Coronavirus 2: NEGATIVE

## 2019-10-05 NOTE — Progress Notes (Addendum)
ADDENDUM:  Chart reviewed by anesthesia, Konrad Felix PA, ok to proceed.   Spoke w/ via phone for pre-op interview--- PT via pt's interpreter video service  Lab needs dos----   No            Lab results--- getting CBC, BMP, T&S, UA done 10-06-2019 @ 0915/  Current ekg in chart/ epic COVID test ------ 10-05-2019  Arrive at ------- 0915 NPO after ------  MN w/ exception clear liquids until 0800 then nothing by mouth (no cream/milk products Medications to take morning of surgery ----- NONE Diabetic medication ----- do not take any diabetic medication morning of surgery  Patient Special Instructions ----- pt instructed to follow Dr Denman George handout given to her from office , to start light diet on Sunday until midnight Sunday night then change to clear liquids   Pre-Op special Istructions ----- pt is deaf.  Requested sign language interpreter to [place] interpreting via email (copy in chart).  Per pt request called and spoke w/ her mother with instructions and directions.  Patient verbalized understanding of instructions that were given at this phone interview. Patient denies shortness of breath, chest pain, fever, cough a this phone interview.   Anesthesia Review:  Hx breast cancer s/p lumpectomy, chemoradiation 2018 and fallopian tube cancer s/p LAVH with BSO  And chemo 2019.  HTN, DM2,  Pt deaf since age 64 1/2.  PCP:  Dr Antony Blackbird (lov 10-02-2019 epic) Cardiologist : no Chest x-ray : cxr 12-02-2018/ CT 10-03-2019 epic EKG :  12-02-2018 epic Echo :  05-31-2018 epic Stress test:  Pt stated never had one Cardiac Cath :  no Sleep Study/ CPAP :  NO Fasting Blood Sugar :  205   / Checks Blood Sugar -- times a day:  Per pt checks every other day in AM Blood Thinner/ Instructions /Last Dose: NO ASA / Instructions/ Last Dose :  NO  Patient denies shortness of breath, chest pain, fever, and cough at this phone interview.

## 2019-10-06 ENCOUNTER — Encounter (HOSPITAL_COMMUNITY)
Admission: RE | Admit: 2019-10-06 | Discharge: 2019-10-06 | Disposition: A | Discharge status: Home / Self Care | Payer: Medicaid Other | Source: Ambulatory Visit | Attending: Gynecologic Oncology | Admitting: Gynecologic Oncology

## 2019-10-06 ENCOUNTER — Telehealth: Payer: Self-pay

## 2019-10-06 ENCOUNTER — Other Ambulatory Visit: Payer: Self-pay

## 2019-10-06 DIAGNOSIS — E119 Type 2 diabetes mellitus without complications: Secondary | ICD-10-CM | POA: Diagnosis not present

## 2019-10-06 DIAGNOSIS — R0600 Dyspnea, unspecified: Secondary | ICD-10-CM | POA: Diagnosis not present

## 2019-10-06 DIAGNOSIS — I10 Essential (primary) hypertension: Secondary | ICD-10-CM | POA: Diagnosis not present

## 2019-10-06 DIAGNOSIS — Z90722 Acquired absence of ovaries, bilateral: Secondary | ICD-10-CM | POA: Diagnosis not present

## 2019-10-06 DIAGNOSIS — Z833 Family history of diabetes mellitus: Secondary | ICD-10-CM | POA: Diagnosis not present

## 2019-10-06 DIAGNOSIS — R971 Elevated cancer antigen 125 [CA 125]: Secondary | ICD-10-CM | POA: Diagnosis not present

## 2019-10-06 DIAGNOSIS — Z923 Personal history of irradiation: Secondary | ICD-10-CM | POA: Diagnosis not present

## 2019-10-06 DIAGNOSIS — Z01812 Encounter for preprocedural laboratory examination: Secondary | ICD-10-CM | POA: Diagnosis not present

## 2019-10-06 DIAGNOSIS — E7800 Pure hypercholesterolemia, unspecified: Secondary | ICD-10-CM | POA: Diagnosis not present

## 2019-10-06 DIAGNOSIS — Z9221 Personal history of antineoplastic chemotherapy: Secondary | ICD-10-CM | POA: Diagnosis not present

## 2019-10-06 DIAGNOSIS — M170 Bilateral primary osteoarthritis of knee: Secondary | ICD-10-CM | POA: Diagnosis not present

## 2019-10-06 DIAGNOSIS — Z800 Family history of malignant neoplasm of digestive organs: Secondary | ICD-10-CM | POA: Diagnosis not present

## 2019-10-06 DIAGNOSIS — Z8543 Personal history of malignant neoplasm of ovary: Secondary | ICD-10-CM | POA: Diagnosis not present

## 2019-10-06 DIAGNOSIS — Z1501 Genetic susceptibility to malignant neoplasm of breast: Secondary | ICD-10-CM | POA: Diagnosis not present

## 2019-10-06 DIAGNOSIS — F329 Major depressive disorder, single episode, unspecified: Secondary | ICD-10-CM | POA: Diagnosis not present

## 2019-10-06 DIAGNOSIS — D649 Anemia, unspecified: Secondary | ICD-10-CM | POA: Diagnosis not present

## 2019-10-06 DIAGNOSIS — Z803 Family history of malignant neoplasm of breast: Secondary | ICD-10-CM | POA: Diagnosis not present

## 2019-10-06 DIAGNOSIS — Z7984 Long term (current) use of oral hypoglycemic drugs: Secondary | ICD-10-CM | POA: Diagnosis not present

## 2019-10-06 DIAGNOSIS — Z853 Personal history of malignant neoplasm of breast: Secondary | ICD-10-CM | POA: Diagnosis not present

## 2019-10-06 DIAGNOSIS — Z90710 Acquired absence of both cervix and uterus: Secondary | ICD-10-CM | POA: Diagnosis not present

## 2019-10-06 DIAGNOSIS — Z8249 Family history of ischemic heart disease and other diseases of the circulatory system: Secondary | ICD-10-CM | POA: Diagnosis not present

## 2019-10-06 DIAGNOSIS — Z79899 Other long term (current) drug therapy: Secondary | ICD-10-CM | POA: Diagnosis not present

## 2019-10-06 DIAGNOSIS — M19022 Primary osteoarthritis, left elbow: Secondary | ICD-10-CM | POA: Diagnosis not present

## 2019-10-06 DIAGNOSIS — M19021 Primary osteoarthritis, right elbow: Secondary | ICD-10-CM | POA: Diagnosis not present

## 2019-10-06 LAB — BASIC METABOLIC PANEL
Anion gap: 8 (ref 5–15)
BUN: 8 mg/dL (ref 6–20)
CO2: 27 mmol/L (ref 22–32)
Calcium: 9.2 mg/dL (ref 8.9–10.3)
Chloride: 104 mmol/L (ref 98–111)
Creatinine, Ser: 0.7 mg/dL (ref 0.44–1.00)
GFR calc Af Amer: >60 mL/min (ref >60)
GFR calc non Af Amer: >60 mL/min (ref >60)
Glucose, Bld: 101 mg/dL — ABNORMAL HIGH (ref 70–99)
Potassium: 4.2 mmol/L (ref 3.5–5.1)
Sodium: 139 mmol/L (ref 135–145)

## 2019-10-06 LAB — URINALYSIS, ROUTINE W REFLEX MICROSCOPIC
Bacteria, UA: NONE SEEN
Bilirubin Urine: NEGATIVE
Glucose, UA: NEGATIVE mg/dL
Ketones, ur: NEGATIVE mg/dL
Leukocytes,Ua: NEGATIVE
Nitrite: NEGATIVE
Protein, ur: NEGATIVE mg/dL
Specific Gravity, Urine: 1.018 (ref 1.005–1.030)
pH: 5 (ref 5.0–8.0)

## 2019-10-06 LAB — CBC
HCT: 38.3 % (ref 36.0–46.0)
Hemoglobin: 11.8 g/dL — ABNORMAL LOW (ref 12.0–15.0)
MCH: 30.3 pg (ref 26.0–34.0)
MCHC: 30.8 g/dL (ref 30.0–36.0)
MCV: 98.5 fL (ref 80.0–100.0)
Platelets: 284 10*3/uL (ref 150–400)
RBC: 3.89 MIL/uL (ref 3.87–5.11)
RDW: 14.3 % (ref 11.5–15.5)
WBC: 8.3 10*3/uL (ref 4.0–10.5)
nRBC: 0 % (ref 0.0–0.2)

## 2019-10-06 NOTE — Telephone Encounter (Signed)
Erin [name] states that she understands the pre-op instructions given yesterday.  Told her that the CT chest shows some enlarged lymph nodes in the left cervical /supraclavicular area. A PET Scan will be ordered to further evaluate the lymph nodes.  This will occur some time after the surgery on 10-09-19. Pt verbalized understanding.

## 2019-10-09 ENCOUNTER — Ambulatory Visit (HOSPITAL_BASED_OUTPATIENT_CLINIC_OR_DEPARTMENT_OTHER): Payer: Medicaid Other | Admitting: Anesthesiology

## 2019-10-09 ENCOUNTER — Ambulatory Visit (HOSPITAL_BASED_OUTPATIENT_CLINIC_OR_DEPARTMENT_OTHER): Payer: Medicaid Other | Admitting: Physician Assistant

## 2019-10-09 ENCOUNTER — Ambulatory Visit (HOSPITAL_BASED_OUTPATIENT_CLINIC_OR_DEPARTMENT_OTHER)
Admission: RE | Admit: 2019-10-09 | Discharge: 2019-10-09 | Disposition: A | Discharge status: Home / Self Care | Payer: Medicaid Other | Attending: Gynecologic Oncology | Admitting: Gynecologic Oncology

## 2019-10-09 ENCOUNTER — Other Ambulatory Visit: Payer: Self-pay | Admitting: Gynecologic Oncology

## 2019-10-09 ENCOUNTER — Encounter (HOSPITAL_BASED_OUTPATIENT_CLINIC_OR_DEPARTMENT_OTHER): Payer: Self-pay | Admitting: Gynecologic Oncology

## 2019-10-09 ENCOUNTER — Encounter (HOSPITAL_BASED_OUTPATIENT_CLINIC_OR_DEPARTMENT_OTHER)
Admission: RE | Disposition: A | Discharge status: Home / Self Care | Payer: Self-pay | Source: Home / Self Care | Attending: Gynecologic Oncology

## 2019-10-09 DIAGNOSIS — M19021 Primary osteoarthritis, right elbow: Secondary | ICD-10-CM | POA: Insufficient documentation

## 2019-10-09 DIAGNOSIS — E7800 Pure hypercholesterolemia, unspecified: Secondary | ICD-10-CM | POA: Insufficient documentation

## 2019-10-09 DIAGNOSIS — Z853 Personal history of malignant neoplasm of breast: Secondary | ICD-10-CM | POA: Insufficient documentation

## 2019-10-09 DIAGNOSIS — Z1501 Genetic susceptibility to malignant neoplasm of breast: Secondary | ICD-10-CM | POA: Diagnosis present

## 2019-10-09 DIAGNOSIS — Z7984 Long term (current) use of oral hypoglycemic drugs: Secondary | ICD-10-CM | POA: Insufficient documentation

## 2019-10-09 DIAGNOSIS — Z79899 Other long term (current) drug therapy: Secondary | ICD-10-CM | POA: Insufficient documentation

## 2019-10-09 DIAGNOSIS — Z1509 Genetic susceptibility to other malignant neoplasm: Secondary | ICD-10-CM | POA: Diagnosis present

## 2019-10-09 DIAGNOSIS — R971 Elevated cancer antigen 125 [CA 125]: Secondary | ICD-10-CM

## 2019-10-09 DIAGNOSIS — Z833 Family history of diabetes mellitus: Secondary | ICD-10-CM | POA: Insufficient documentation

## 2019-10-09 DIAGNOSIS — Z923 Personal history of irradiation: Secondary | ICD-10-CM | POA: Diagnosis not present

## 2019-10-09 DIAGNOSIS — C5701 Malignant neoplasm of right fallopian tube: Secondary | ICD-10-CM | POA: Diagnosis present

## 2019-10-09 DIAGNOSIS — I10 Essential (primary) hypertension: Secondary | ICD-10-CM | POA: Insufficient documentation

## 2019-10-09 DIAGNOSIS — M19022 Primary osteoarthritis, left elbow: Secondary | ICD-10-CM | POA: Insufficient documentation

## 2019-10-09 DIAGNOSIS — R0600 Dyspnea, unspecified: Secondary | ICD-10-CM | POA: Insufficient documentation

## 2019-10-09 DIAGNOSIS — Z803 Family history of malignant neoplasm of breast: Secondary | ICD-10-CM | POA: Insufficient documentation

## 2019-10-09 DIAGNOSIS — M170 Bilateral primary osteoarthritis of knee: Secondary | ICD-10-CM | POA: Insufficient documentation

## 2019-10-09 DIAGNOSIS — Z8543 Personal history of malignant neoplasm of ovary: Secondary | ICD-10-CM | POA: Insufficient documentation

## 2019-10-09 DIAGNOSIS — Z90710 Acquired absence of both cervix and uterus: Secondary | ICD-10-CM | POA: Insufficient documentation

## 2019-10-09 DIAGNOSIS — F329 Major depressive disorder, single episode, unspecified: Secondary | ICD-10-CM | POA: Insufficient documentation

## 2019-10-09 DIAGNOSIS — R978 Other abnormal tumor markers: Secondary | ICD-10-CM | POA: Diagnosis not present

## 2019-10-09 DIAGNOSIS — D509 Iron deficiency anemia, unspecified: Secondary | ICD-10-CM | POA: Diagnosis not present

## 2019-10-09 DIAGNOSIS — D649 Anemia, unspecified: Secondary | ICD-10-CM | POA: Insufficient documentation

## 2019-10-09 DIAGNOSIS — R590 Localized enlarged lymph nodes: Secondary | ICD-10-CM

## 2019-10-09 DIAGNOSIS — E119 Type 2 diabetes mellitus without complications: Secondary | ICD-10-CM | POA: Insufficient documentation

## 2019-10-09 DIAGNOSIS — Z90722 Acquired absence of ovaries, bilateral: Secondary | ICD-10-CM | POA: Insufficient documentation

## 2019-10-09 DIAGNOSIS — Z9221 Personal history of antineoplastic chemotherapy: Secondary | ICD-10-CM | POA: Insufficient documentation

## 2019-10-09 DIAGNOSIS — Z800 Family history of malignant neoplasm of digestive organs: Secondary | ICD-10-CM | POA: Insufficient documentation

## 2019-10-09 DIAGNOSIS — Z8249 Family history of ischemic heart disease and other diseases of the circulatory system: Secondary | ICD-10-CM | POA: Insufficient documentation

## 2019-10-09 HISTORY — DX: Personal history of malignant neoplasm of other female genital organs: Z85.44

## 2019-10-09 HISTORY — DX: Estrogen receptor negative status (ER-): C50.512

## 2019-10-09 HISTORY — DX: Preglaucoma, unspecified, right eye: H40.001

## 2019-10-09 HISTORY — DX: Malignant neoplasm of lower-outer quadrant of left female breast: Z17.1

## 2019-10-09 HISTORY — DX: Anesthesia of skin: R20.0

## 2019-10-09 HISTORY — DX: Personal history of irradiation: Z92.3

## 2019-10-09 HISTORY — DX: Personal history of antineoplastic chemotherapy: Z92.21

## 2019-10-09 HISTORY — DX: Hyperlipidemia, unspecified: E78.5

## 2019-10-09 HISTORY — PX: LAPAROSCOPY: SHX197

## 2019-10-09 HISTORY — DX: Other chronic pain: G89.29

## 2019-10-09 HISTORY — DX: Presence of spectacles and contact lenses: Z97.3

## 2019-10-09 HISTORY — DX: Elevated cancer antigen 125 (CA 125): R97.1

## 2019-10-09 HISTORY — DX: Iron deficiency anemia, unspecified: D50.9

## 2019-10-09 LAB — GLUCOSE, CAPILLARY
Glucose-Capillary: 94 mg/dL (ref 70–99)
Glucose-Capillary: 107 mg/dL — ABNORMAL HIGH (ref 70–99)

## 2019-10-09 SURGERY — LAPAROSCOPY, DIAGNOSTIC
Anesthesia: General

## 2019-10-09 MED ORDER — OXYCODONE HCL 5 MG PO TABS
5.0000 mg | ORAL_TABLET | Freq: Once | ORAL | Status: DC | PRN
  Filled 2019-10-09: qty 1

## 2019-10-09 MED ORDER — FENTANYL CITRATE (PF) 100 MCG/2ML IJ SOLN
INTRAMUSCULAR | Status: DC | PRN
  Administered 2019-10-09 (×3): 50 ug via INTRAVENOUS
  Administered 2019-10-09: 25 ug via INTRAVENOUS

## 2019-10-09 MED ORDER — LIDOCAINE HCL (CARDIAC) PF 100 MG/5ML IV SOSY
PREFILLED_SYRINGE | INTRAVENOUS | Status: DC | PRN
  Administered 2019-10-09: 50 mg via INTRAVENOUS

## 2019-10-09 MED ORDER — FENTANYL CITRATE (PF) 100 MCG/2ML IJ SOLN
25.0000 ug | INTRAMUSCULAR | Status: DC | PRN
  Filled 2019-10-09: qty 1

## 2019-10-09 MED ORDER — SCOPOLAMINE 1 MG/3DAYS TD PT72
MEDICATED_PATCH | TRANSDERMAL | Status: AC
  Filled 2019-10-09: qty 1

## 2019-10-09 MED ORDER — ONDANSETRON HCL 4 MG/2ML IJ SOLN
INTRAMUSCULAR | Status: AC
  Filled 2019-10-09: qty 2

## 2019-10-09 MED ORDER — PROPOFOL 10 MG/ML IV BOLUS
INTRAVENOUS | Status: DC | PRN
  Administered 2019-10-09: 70 mg via INTRAVENOUS

## 2019-10-09 MED ORDER — SCOPOLAMINE 1 MG/3DAYS TD PT72
1.0000 | MEDICATED_PATCH | TRANSDERMAL | Status: DC
  Administered 2019-10-09: 10:00:00 1.5 mg via TRANSDERMAL
  Filled 2019-10-09: qty 1

## 2019-10-09 MED ORDER — ACETAMINOPHEN 500 MG PO TABS
1000.0000 mg | ORAL_TABLET | ORAL | Status: AC
  Administered 2019-10-09: 1000 mg via ORAL
  Filled 2019-10-09: qty 2

## 2019-10-09 MED ORDER — ONDANSETRON HCL 4 MG/2ML IJ SOLN
4.0000 mg | Freq: Once | INTRAMUSCULAR | Status: AC | PRN
  Administered 2019-10-09: 4 mg via INTRAVENOUS
  Filled 2019-10-09: qty 2

## 2019-10-09 MED ORDER — ROCURONIUM BROMIDE 10 MG/ML (PF) SYRINGE
PREFILLED_SYRINGE | INTRAVENOUS | Status: AC
  Filled 2019-10-09: qty 10

## 2019-10-09 MED ORDER — MIDAZOLAM HCL 2 MG/2ML IJ SOLN
INTRAMUSCULAR | Status: AC
  Filled 2019-10-09: qty 2

## 2019-10-09 MED ORDER — BUPIVACAINE HCL (PF) 0.25 % IJ SOLN
INTRAMUSCULAR | Status: DC | PRN
  Administered 2019-10-09: 10 mL

## 2019-10-09 MED ORDER — LIDOCAINE 2% (20 MG/ML) 5 ML SYRINGE
INTRAMUSCULAR | Status: AC
  Filled 2019-10-09: qty 5

## 2019-10-09 MED ORDER — PROPOFOL 10 MG/ML IV BOLUS
INTRAVENOUS | Status: AC
  Filled 2019-10-09: qty 20

## 2019-10-09 MED ORDER — DEXAMETHASONE SODIUM PHOSPHATE 4 MG/ML IJ SOLN
4.0000 mg | INTRAMUSCULAR | Status: AC
  Administered 2019-10-09: 4 mg via INTRAVENOUS
  Filled 2019-10-09: qty 1

## 2019-10-09 MED ORDER — CEFAZOLIN SODIUM-DEXTROSE 2-4 GM/100ML-% IV SOLN
2.0000 g | INTRAVENOUS | Status: AC
  Administered 2019-10-09: 2 g via INTRAVENOUS
  Filled 2019-10-09: qty 100

## 2019-10-09 MED ORDER — ROCURONIUM BROMIDE 100 MG/10ML IV SOLN
INTRAVENOUS | Status: DC | PRN
  Administered 2019-10-09: 50 mg via INTRAVENOUS

## 2019-10-09 MED ORDER — ACETAMINOPHEN 500 MG PO TABS
ORAL_TABLET | ORAL | Status: AC
  Filled 2019-10-09: qty 2

## 2019-10-09 MED ORDER — LACTATED RINGERS IV SOLN
INTRAVENOUS | Status: DC
  Filled 2019-10-09: qty 1000

## 2019-10-09 MED ORDER — SUGAMMADEX SODIUM 200 MG/2ML IV SOLN
INTRAVENOUS | Status: DC | PRN
  Administered 2019-10-09: 200 mg via INTRAVENOUS

## 2019-10-09 MED ORDER — CELECOXIB 200 MG PO CAPS
ORAL_CAPSULE | ORAL | Status: AC
  Filled 2019-10-09: qty 2

## 2019-10-09 MED ORDER — CEFAZOLIN SODIUM-DEXTROSE 2-4 GM/100ML-% IV SOLN
INTRAVENOUS | Status: AC
  Filled 2019-10-09: qty 100

## 2019-10-09 MED ORDER — GABAPENTIN 300 MG PO CAPS
300.0000 mg | ORAL_CAPSULE | ORAL | Status: AC
  Administered 2019-10-09: 300 mg via ORAL
  Filled 2019-10-09: qty 1

## 2019-10-09 MED ORDER — FENTANYL CITRATE (PF) 250 MCG/5ML IJ SOLN
INTRAMUSCULAR | Status: AC
  Filled 2019-10-09: qty 5

## 2019-10-09 MED ORDER — MIDAZOLAM HCL 5 MG/5ML IJ SOLN
INTRAMUSCULAR | Status: DC | PRN
  Administered 2019-10-09 (×2): 1 mg via INTRAVENOUS

## 2019-10-09 MED ORDER — DEXAMETHASONE SODIUM PHOSPHATE 10 MG/ML IJ SOLN
INTRAMUSCULAR | Status: AC
  Filled 2019-10-09: qty 1

## 2019-10-09 MED ORDER — OXYCODONE HCL 5 MG/5ML PO SOLN
5.0000 mg | Freq: Once | ORAL | Status: DC | PRN
  Filled 2019-10-09: qty 5

## 2019-10-09 MED ORDER — GABAPENTIN 300 MG PO CAPS
ORAL_CAPSULE | ORAL | Status: AC
  Filled 2019-10-09: qty 1

## 2019-10-09 MED ORDER — ONDANSETRON HCL 4 MG/2ML IJ SOLN
INTRAMUSCULAR | Status: DC | PRN
  Administered 2019-10-09: 4 mg via INTRAVENOUS

## 2019-10-09 MED ORDER — CELECOXIB 400 MG PO CAPS
400.0000 mg | ORAL_CAPSULE | ORAL | Status: AC
  Administered 2019-10-09: 400 mg via ORAL
  Filled 2019-10-09: qty 1

## 2019-10-09 SURGICAL SUPPLY — 37 items
ADH SKN CLS APL DERMABOND .7 (GAUZE/BANDAGES/DRESSINGS) ×1
APL PRP STRL LF DISP 70% ISPRP (MISCELLANEOUS) ×1
BAG SPEC RTRVL LRG 6X4 10 (ENDOMECHANICALS)
CABLE HIGH FREQUENCY MONO STRZ (ELECTRODE) IMPLANT
CHLORAPREP W/TINT 26 (MISCELLANEOUS) ×2 IMPLANT
CONT SPEC 4OZ CLIKSEAL STRL BL (MISCELLANEOUS) IMPLANT
COVER WAND RF STERILE (DRAPES) IMPLANT
DECANTER SPIKE VIAL GLASS SM (MISCELLANEOUS) IMPLANT
DERMABOND ADVANCED (GAUZE/BANDAGES/DRESSINGS) ×1
DERMABOND ADVANCED .7 DNX12 (GAUZE/BANDAGES/DRESSINGS) ×1 IMPLANT
ELECT REM PT RETURN 9FT ADLT (ELECTROSURGICAL) ×2
ELECTRODE REM PT RTRN 9FT ADLT (ELECTROSURGICAL) ×1 IMPLANT
GLOVE BIO SURGEON STRL SZ 6 (GLOVE) ×4 IMPLANT
GLOVE BIO SURGEON STRL SZ 6.5 (GLOVE) ×4 IMPLANT
GOWN STRL REUS W/TWL LRG LVL3 (GOWN DISPOSABLE) ×4 IMPLANT
HOLDER FOLEY CATH W/STRAP (MISCELLANEOUS) IMPLANT
IRRIG SUCT STRYKERFLOW 2 WTIP (MISCELLANEOUS)
IRRIGATION SUCT STRKRFLW 2 WTP (MISCELLANEOUS) IMPLANT
MANIPULATOR UTERINE 4.5 ZUMI (MISCELLANEOUS) IMPLANT
PACK BASIN DAY SURGERY FS (CUSTOM PROCEDURE TRAY) ×2 IMPLANT
PAD POSITIONING PINK XL (MISCELLANEOUS) ×2 IMPLANT
POUCH SPECIMEN RETRIEVAL 10MM (ENDOMECHANICALS) IMPLANT
SCISSORS LAP 5X35 DISP (ENDOMECHANICALS) IMPLANT
SEALER TISSUE G2 CVD JAW 45CM (ENDOMECHANICALS) IMPLANT
SHEET LAVH (DRAPES) ×2 IMPLANT
SLEEVE XCEL OPT CAN 5 100 (ENDOMECHANICALS) ×2 IMPLANT
SUT MNCRL AB 4-0 PS2 18 (SUTURE) ×4 IMPLANT
SYS RETRIEVAL 5MM INZII UNIV (BASKET)
SYSTEM RETRIEVL 5MM INZII UNIV (BASKET) IMPLANT
TOWEL OR 17X26 10 PK STRL BLUE (TOWEL DISPOSABLE) ×2 IMPLANT
TOWEL OR NON WOVEN STRL DISP B (DISPOSABLE) ×2 IMPLANT
TRAY FOL W/BAG SLVR 16FR STRL (SET/KITS/TRAYS/PACK) ×1 IMPLANT
TRAY FOLEY W/BAG SLVR 16FR LF (SET/KITS/TRAYS/PACK) ×2
TRAY LAPAROSCOPIC (CUSTOM PROCEDURE TRAY) ×2 IMPLANT
TROCAR BLADELESS OPT 5 100 (ENDOMECHANICALS) ×2 IMPLANT
TROCAR XCEL 12X100 BLDLESS (ENDOMECHANICALS) IMPLANT
TROCAR XCEL BLUNT TIP 100MML (ENDOMECHANICALS) IMPLANT

## 2019-10-09 NOTE — Op Note (Signed)
OPERATIVE NOTE  Surgeon: Donaciano Eva   Assistants: Joylene John, NP ((a provider level assistant was necessary for tissue manipulation, retraction and positioning due to the complexity of the case and hospital policies).   Anesthesia: General endotracheal anesthesia  ASA Class: 3   Pre-operative Diagnosis: history of breast and ovarian cancer, elevated tumor markers  Post-operative Diagnosis: same  Operation: Diagnostic laparoscopy with peritoneal biopsies  Surgeon: Donaciano Eva  Assistant Surgeon: none  Anesthesia: GET  Urine Output: 100cc  Operative Findings:  : normal appearing peritoneum. No adhesions, smooth diaphragm, no ascites. No evidence of intraperitoneal disease.   Procedure Details  The patient was seen in the Holding Room. The risks, benefits, complications, treatment options, and expected outcomes were discussed with the patient.  The patient concurred with the proposed plan, giving informed consent.  The site of surgery properly noted/marked. The patient was identified as Erin [name] and the procedure verified as a diagnostic laparoscopy. A Time Out was held and the above information confirmed.  After induction of anesthesia, the patient was draped and prepped in the usual sterile manner. Pt was placed in supine position after anesthesia and draped and prepped in the usual sterile manner. The abdominal drape was placed after the CholoraPrep had been allowed to dry for 3 minutes.  Her arms were tucked to her side with all appropriate precautions.  The chest was secured to the table.  The patient was placed in the semi-lithotomy position in [place].  The perineum was prepped with Betadine.  Foley catheter was placed. OG tube placement was confirmed and to suction.   Procedure:  Quarter percent marcaine was injected into all incision sites prior to making them with an 11 blade scalpel. A 5 mm skin incision was made in the left upper quadrant. The  5 mm Optiview port and scope was used for direct entry.  Opening pressure was under 10 mm CO2. The abdomen was insufflated and the findings were noted as above. At this point and all points during the procedure, the patient's intra-abdominal pressure did not exceed 15 mmHg. The patient was placed in steep trendelenberg to visualize pelvic anatomy. Next, a 5 mm skin incision was made in the umbilicus and a 5 mm disposable port was placed through this intraperitoneally under direct visualization. Bowel was away into the upper abdomen.    There were biopsies of representative peritoneum at the left pelvic sidewall and anterior peritoneum. Washings were obtained. Images were obtained from all 4 quadrants.  At this point in the procedure was completed.  Ports were removed under direct visualization. All entry sites were hemostatic. CO2 insufflation was removed from the patient's abdomen.  The 10 mm port was closed with Vicryl on a UR-5 needle at the fascia.  The skin was closed with 4-0 Vicryl in a subcuticular manner.  Dermabond was applied to the incisions.  Sponge, lap and needle counts correct x 2.  The manipulator was retrieved from the uterus.The patient was taken to the recovery room in stable condition.  The vagina was swabbed with  minimal bleeding noted.   All instrument and needle counts were correct x  3.   The patient was transferred to the recovery room in a stable condition.  Estimated Blood Loss:  <10cc      Total IV Fluids: 800 ml         Specimens: 1/ washings 2/ left pelvic sidewall peritoneum 3/ anterior abdominal peritoneum  Complications:  None; patient tolerated the procedure well.         Disposition: PACU - hemodynamically stable.

## 2019-10-09 NOTE — Anesthesia Preprocedure Evaluation (Addendum)
Anesthesia Evaluation  Patient identified by MRN, date of birth, ID band Patient awake    Reviewed: Allergy & Precautions, NPO status , Patient's Chart, lab work & pertinent test results  History of Anesthesia Complications Negative for: history of anesthetic complications  Airway Mallampati: III  TM Distance: >3 FB Neck ROM: Full    Dental  (+) Teeth Intact   Pulmonary neg pulmonary ROS,    Pulmonary exam normal        Cardiovascular hypertension, Normal cardiovascular exam     Neuro/Psych negative neurological ROS  negative psych ROS   GI/Hepatic negative GI ROS, Neg liver ROS,   Endo/Other  diabetes, Type 2, Oral Hypoglycemic Agents  Renal/GU negative Renal ROS  negative genitourinary   Musculoskeletal negative musculoskeletal ROS (+)   Abdominal   Peds  Hematology  (+) anemia ,   Anesthesia Other Findings Pt is deaf, uses sign language  H/o breast and fallopian tube cancer s/p chemo/XRT BRCA2 mutation +  Reproductive/Obstetrics                           Anesthesia Physical Anesthesia Plan  ASA: III  Anesthesia Plan: General   Post-op Pain Management:    Induction: Intravenous  PONV Risk Score and Plan: 3 and Ondansetron, Dexamethasone, Treatment may vary due to age or medical condition and Midazolam  Airway Management Planned: Oral ETT  Additional Equipment: None  Intra-op Plan:   Post-operative Plan: Extubation in OR  Informed Consent: I have reviewed the patients History and Physical, chart, labs and discussed the procedure including the risks, benefits and alternatives for the proposed anesthesia with the patient or authorized representative who has indicated his/her understanding and acceptance.     Dental advisory given  Plan Discussed with:   Anesthesia Plan Comments:         Anesthesia Quick Evaluation

## 2019-10-09 NOTE — Anesthesia Postprocedure Evaluation (Signed)
Anesthesia Post Note  Patient: Erin [name]  Procedure(s) Performed: LAPAROSCOPY DIAGNOSTIC WITH PERITONEAL BIOPSIES (N/A )     Patient location during evaluation: PACU Anesthesia Type: General Level of consciousness: awake and alert Pain management: pain level controlled Vital Signs Assessment: post-procedure vital signs reviewed and stable Respiratory status: spontaneous breathing, nonlabored ventilation and respiratory function stable Cardiovascular status: blood pressure returned to baseline and stable Postop Assessment: no apparent nausea or vomiting Anesthetic complications: no    Last Vitals:  Vitals:   10/09/19 1300 10/09/19 1315  BP: 125/72 122/72  Pulse: 68 70  Resp: 17 17  Temp:    SpO2: 96% 94%    Last Pain:  Vitals:   10/09/19 1315  TempSrc:   PainSc: 0-No pain                 Lidia Collum

## 2019-10-09 NOTE — Discharge Instructions (Addendum)
10/09/2019  Return to work: 2-4 weeks if applicable  Activity: 1. Be up and out of the bed during the day.  Take a nap if needed.  You may walk up steps but be careful and use the hand rail.  Stair climbing will tire you more than you think, you may need to stop part way and rest.   2. No lifting or straining for 6 weeks.  3. No driving for 1 week(s).  Do not drive if you are taking narcotic pain medicine.  4. Shower daily.  Use soap and water on your incision and pat dry; don't rub.  No tub baths until cleared by your surgeon.   5. No sexual activity and nothing in the vagina for 2 weeks.  6. You may experience a small amount of clear drainage from your incisions, which is normal.  If the drainage persists or increases, please call the office.  7. Take Tylenol or ibuprofen first for pain and only use narcotic pain medication for severe pain not relieved by the Tylenol or Ibuprofen.  Monitor your Tylenol intake to a max of 4,000 mg.  Diet: 1. Low sodium Heart Healthy Diet is recommended.  2. It is safe to use a laxative, such as Miralax or Colace, if you have difficulty moving your bowels. You can take Sennakot at bedtime every evening to keep bowel movements regular and to prevent constipation.    Wound Care: 1. Keep clean and dry.  Shower daily.  Reasons to call the Doctor:  Fever - Oral temperature greater than 100.4 degrees Fahrenheit  Foul-smelling vaginal discharge  Difficulty urinating  Nausea and vomiting  Increased pain at the site of the incision that is unrelieved with pain medicine.  Difficulty breathing with or without chest pain  New calf pain especially if only on one side  Sudden, continuing increased vaginal bleeding with or without clots.   Contacts: For questions or concerns you should contact:  Dr. [name] at [phone]  Joylene John, NP at [phone]  After Hours: call [phone] and have the GYN Oncologist paged/contacted    Post  Anesthesia Home Care Instructions  Activity: Get plenty of rest for the remainder of the day. A responsible individual must stay with you for 24 hours following the procedure.  For the next 24 hours, DO NOT: -Drive a car -Paediatric nurse -Drink alcoholic beverages -Take any medication unless instructed by your physician -Make any legal decisions or sign important papers.  Meals: Start with liquid foods such as gelatin or soup. Progress to regular foods as tolerated. Avoid greasy, spicy, heavy foods. If nausea and/or vomiting occur, drink only clear liquids until the nausea and/or vomiting subsides. Call your physician if vomiting continues.  Special Instructions/Symptoms: Your throat may feel dry or sore from the anesthesia or the breathing tube placed in your throat during surgery. If this causes discomfort, gargle with warm salt water. The discomfort should disappear within 24 hours.  If you had a scopolamine patch placed behind your ear for the management of post- operative nausea and/or vomiting:  1. The medication in the patch is effective for 72 hours, after which it should be removed.  Wrap patch in a tissue and discard in the trash. Wash hands thoroughly with soap and water. 2. You may remove the patch earlier than 72 hours if you experience unpleasant side effects which may include dry mouth, dizziness or visual disturbances. 3. Avoid touching the patch. Wash your hands with soap and water after contact  with the patch.

## 2019-10-09 NOTE — Transfer of Care (Signed)
Immediate Anesthesia Transfer of Care Note  Patient: Erin [name]  Procedure(s) Performed: LAPAROSCOPY DIAGNOSTIC WITH PERITONEAL BIOPSIES (N/A )  Patient Location: PACU  Anesthesia Type:General  Level of Consciousness: awake, alert , oriented and patient cooperative  Airway & Oxygen Therapy: Patient Spontanous Breathing and Patient connected to face mask oxygen  Post-op Assessment: Report given to RN and Post -op Vital signs reviewed and stable  Post vital signs: Reviewed and stable  Last Vitals:  Vitals Value Taken Time  BP 96/60 10/09/19 1152  Temp    Pulse 86 10/09/19 1153  Resp 21 10/09/19 1153  SpO2 100 % 10/09/19 1153  Vitals shown include unvalidated device data.  Last Pain:  Vitals:   10/09/19 1006  TempSrc: Oral  PainSc: 0-No pain      Patients Stated Pain Goal: 5 ([id number])  Complications: No apparent anesthesia complications

## 2019-10-09 NOTE — Interval H&P Note (Signed)
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  The various methods of treatment have been discussed with the patient and family. After consideration of risks, benefits and other options for treatment, the patient has consented to  Procedure(s): LAPAROSCOPY DIAGNOSTIC WITH PERITONEAL BIOPSIES (N/A) as a surgical intervention.  The patient's history has been reviewed, patient examined, no change in status, stable for surgery.  I have reviewed the patient's chart and labs.  Questions were answered to the patient's satisfaction.     Thereasa Solo

## 2019-10-09 NOTE — Anesthesia Procedure Notes (Signed)
Procedure Name: Intubation Date/Time: 10/09/2019 10:58 AM Performed by: Garrel Ridgel, CRNA Pre-anesthesia Checklist: Patient identified Patient Re-evaluated:Patient Re-evaluated prior to induction Oxygen Delivery Method: Circle system utilized Preoxygenation: Pre-oxygenation with 100% oxygen Induction Type: IV induction Ventilation: Mask ventilation without difficulty Laryngoscope Size: Mac and 3 Grade View: Grade III Tube type: Oral Tube size: 7.0 mm Number of attempts: 1 Airway Equipment and Method: Stylet and Oral airway Placement Confirmation: ETT inserted through vocal cords under direct vision,  positive ETCO2 and breath sounds checked- equal and bilateral Secured at: 21 cm Tube secured with: Tape Dental Injury: Teeth and Oropharynx as per pre-operative assessment

## 2019-10-10 ENCOUNTER — Telehealth: Payer: Self-pay | Admitting: *Deleted

## 2019-10-10 LAB — TYPE AND SCREEN
ABO/RH(D): A POS
Antibody Screen: POSITIVE
Unit division: 0
Unit division: 0

## 2019-10-10 LAB — BPAM RBC
Blood Product Expiration Date: 202102122359
Blood Product Expiration Date: 202102142359
Unit Type and Rh: 6200
Unit Type and Rh: 6200

## 2019-10-10 LAB — CYTOLOGY - NON PAP

## 2019-10-10 LAB — SURGICAL PATHOLOGY

## 2019-10-10 NOTE — Telephone Encounter (Signed)
Post op call - Pt denies pain. Pt is ambulating, eating and  drinking well. Pt took Senakot last night and had a BM. Pt reports incisions to be C/D/I. Pt denies pain or fever. Pt aware of f/u appointment w/ Dr. Denman George, and pt. has office number to call with any questions or concerns.

## 2019-10-10 NOTE — Telephone Encounter (Signed)
Scheduled the patient for a PET scan. Called and left the mother a message to call the office.   Mother called back and was given the appt time/date; along with instructions

## 2019-10-16 ENCOUNTER — Telehealth: Payer: Self-pay | Admitting: *Deleted

## 2019-10-16 NOTE — Telephone Encounter (Signed)
Patient called on Saturday 1/30 at 10:30am per language interpreter line. Patient stated that she was in pain and can't eat. Message given to desk RN

## 2019-10-16 NOTE — Telephone Encounter (Signed)
Spoke with Erin [name] She stated that she was in pain. She took some senokot and moved her bowels.  She is not in pain now and can eat. She is using the ibuprofen as needed for surgical discomfort. Afebrile incisions D&I.

## 2019-10-17 ENCOUNTER — Other Ambulatory Visit: Payer: Self-pay

## 2019-10-17 ENCOUNTER — Encounter: Payer: Self-pay | Admitting: Family Medicine

## 2019-10-17 ENCOUNTER — Ambulatory Visit
Admission: RE | Admit: 2019-10-17 | Discharge: 2019-10-17 | Disposition: A | Discharge status: Home / Self Care | Payer: Medicaid Other | Source: Ambulatory Visit | Attending: Hematology and Oncology | Admitting: Hematology and Oncology

## 2019-10-17 ENCOUNTER — Other Ambulatory Visit: Payer: Self-pay | Admitting: *Deleted

## 2019-10-17 DIAGNOSIS — Z1509 Genetic susceptibility to other malignant neoplasm: Secondary | ICD-10-CM

## 2019-10-17 DIAGNOSIS — Z1501 Genetic susceptibility to malignant neoplasm of breast: Secondary | ICD-10-CM

## 2019-10-17 DIAGNOSIS — C5701 Malignant neoplasm of right fallopian tube: Secondary | ICD-10-CM

## 2019-10-17 DIAGNOSIS — Z853 Personal history of malignant neoplasm of breast: Secondary | ICD-10-CM | POA: Diagnosis not present

## 2019-10-17 DIAGNOSIS — N6310 Unspecified lump in the right breast, unspecified quadrant: Secondary | ICD-10-CM

## 2019-10-17 DIAGNOSIS — C50512 Malignant neoplasm of lower-outer quadrant of left female breast: Secondary | ICD-10-CM

## 2019-10-17 MED ORDER — GADOBUTROL 1 MMOL/ML IV SOLN
7.0000 mL | Freq: Once | INTRAVENOUS | Status: AC | PRN
  Administered 2019-10-17: 11:00:00 7 mL via INTRAVENOUS

## 2019-10-18 ENCOUNTER — Other Ambulatory Visit: Payer: Self-pay | Admitting: *Deleted

## 2019-10-18 DIAGNOSIS — C50512 Malignant neoplasm of lower-outer quadrant of left female breast: Secondary | ICD-10-CM

## 2019-10-19 ENCOUNTER — Other Ambulatory Visit: Payer: Self-pay | Admitting: Hematology and Oncology

## 2019-10-19 ENCOUNTER — Telehealth: Payer: Self-pay | Admitting: *Deleted

## 2019-10-19 DIAGNOSIS — R9389 Abnormal findings on diagnostic imaging of other specified body structures: Secondary | ICD-10-CM

## 2019-10-19 DIAGNOSIS — C50512 Malignant neoplasm of lower-outer quadrant of left female breast: Secondary | ICD-10-CM

## 2019-10-19 DIAGNOSIS — Z171 Estrogen receptor negative status [ER-]: Secondary | ICD-10-CM

## 2019-10-19 NOTE — Telephone Encounter (Signed)
Left vm through sign assist for pt to return call regarding MRI results. Contact information provided.

## 2019-10-20 ENCOUNTER — Ambulatory Visit (HOSPITAL_COMMUNITY)
Admission: RE | Admit: 2019-10-20 | Discharge: 2019-10-20 | Disposition: A | Discharge status: Home / Self Care | Payer: Medicaid Other | Source: Ambulatory Visit | Attending: Gynecologic Oncology | Admitting: Gynecologic Oncology

## 2019-10-20 ENCOUNTER — Telehealth: Payer: Self-pay | Admitting: *Deleted

## 2019-10-20 ENCOUNTER — Other Ambulatory Visit: Payer: Self-pay

## 2019-10-20 DIAGNOSIS — C5701 Malignant neoplasm of right fallopian tube: Secondary | ICD-10-CM | POA: Insufficient documentation

## 2019-10-20 DIAGNOSIS — R590 Localized enlarged lymph nodes: Secondary | ICD-10-CM | POA: Diagnosis present

## 2019-10-20 DIAGNOSIS — R971 Elevated cancer antigen 125 [CA 125]: Secondary | ICD-10-CM | POA: Diagnosis present

## 2019-10-20 LAB — GLUCOSE, CAPILLARY: Glucose-Capillary: 90 mg/dL (ref 70–99)

## 2019-10-20 MED ORDER — FLUDEOXYGLUCOSE F - 18 (FDG) INJECTION
8.0400 | Freq: Once | INTRAVENOUS | Status: AC | PRN
  Administered 2019-10-20: 8.04 via INTRAVENOUS

## 2019-10-20 NOTE — Telephone Encounter (Signed)
Spoke to pt concerning MRI results and need for MR bx. Confirmed appt for 2/17 at 7am. Denies further questions at this time.

## 2019-10-23 ENCOUNTER — Other Ambulatory Visit: Payer: Self-pay | Admitting: Oncology

## 2019-10-23 ENCOUNTER — Telehealth: Payer: Self-pay | Admitting: *Deleted

## 2019-10-23 ENCOUNTER — Inpatient Hospital Stay: Payer: Medicaid Other | Attending: Gynecologic Oncology | Admitting: Hematology and Oncology

## 2019-10-23 ENCOUNTER — Other Ambulatory Visit: Payer: Self-pay

## 2019-10-23 VITALS — BP 138/69 | HR 91 | Temp 98.7°F | Resp 18 | Ht 61.0 in | Wt 160.0 lb

## 2019-10-23 DIAGNOSIS — Z148 Genetic carrier of other disease: Secondary | ICD-10-CM | POA: Insufficient documentation

## 2019-10-23 DIAGNOSIS — C7951 Secondary malignant neoplasm of bone: Secondary | ICD-10-CM | POA: Insufficient documentation

## 2019-10-23 DIAGNOSIS — Z171 Estrogen receptor negative status [ER-]: Secondary | ICD-10-CM | POA: Insufficient documentation

## 2019-10-23 DIAGNOSIS — C5701 Malignant neoplasm of right fallopian tube: Secondary | ICD-10-CM | POA: Insufficient documentation

## 2019-10-23 DIAGNOSIS — C50512 Malignant neoplasm of lower-outer quadrant of left female breast: Secondary | ICD-10-CM | POA: Insufficient documentation

## 2019-10-23 DIAGNOSIS — Z923 Personal history of irradiation: Secondary | ICD-10-CM | POA: Insufficient documentation

## 2019-10-23 DIAGNOSIS — R971 Elevated cancer antigen 125 [CA 125]: Secondary | ICD-10-CM | POA: Diagnosis not present

## 2019-10-23 DIAGNOSIS — Z79899 Other long term (current) drug therapy: Secondary | ICD-10-CM | POA: Diagnosis not present

## 2019-10-23 DIAGNOSIS — Z791 Long term (current) use of non-steroidal anti-inflammatories (NSAID): Secondary | ICD-10-CM | POA: Diagnosis not present

## 2019-10-23 DIAGNOSIS — Z90710 Acquired absence of both cervix and uterus: Secondary | ICD-10-CM | POA: Insufficient documentation

## 2019-10-23 DIAGNOSIS — Z9221 Personal history of antineoplastic chemotherapy: Secondary | ICD-10-CM | POA: Diagnosis not present

## 2019-10-23 DIAGNOSIS — H9190 Unspecified hearing loss, unspecified ear: Secondary | ICD-10-CM | POA: Insufficient documentation

## 2019-10-23 DIAGNOSIS — Z7984 Long term (current) use of oral hypoglycemic drugs: Secondary | ICD-10-CM | POA: Insufficient documentation

## 2019-10-23 DIAGNOSIS — Z90722 Acquired absence of ovaries, bilateral: Secondary | ICD-10-CM | POA: Insufficient documentation

## 2019-10-23 NOTE — Progress Notes (Signed)
Patient Care Team: Antony Blackbird, MD as PCP - General (Family Medicine) Excell Seltzer, MD (Inactive) as Consulting Physician (General Surgery) [name], MD as Consulting Physician (Hematology and Oncology) Kyung Rudd, MD as Consulting Physician (Radiation Oncology) [name], MD as Consulting Physician (Gynecologic Oncology) Delice Bison Charlestine Massed, NP as Nurse Practitioner (Hematology and Oncology)  DIAGNOSIS:  Encounter Diagnosis  Name Primary?  . Malignant neoplasm of lower-outer quadrant of left breast of female, estrogen receptor negative ([place]) Yes    SUMMARY OF ONCOLOGIC HISTORY: Oncology History  Malignant neoplasm of lower-outer quadrant of left breast of female, estrogen receptor negative ([place])  05/11/2017 Initial Diagnosis   Left breast biopsy 3:30 position 6 cm from nipple: IDC with DCIS, lymphovascular invasion present, grade 2-3, ER 0%, PR 0%, HER-2 positive ratio 2.28, Ki-67 20%, 1 cm lesion left breast, T1b N0 stage IA clinical stage   06/09/2017 Surgery   Left lumpectomy: IDC grade 3, 1.2 cm, DCIS is present, margins negative, 0/6 lymph nodes negative, ER 0%, PR 0%, HER-2 positive ratio 2.28, Ki-67 20%, T1 BN 0 stage IA   07/09/2017 - 09/29/2017 Adjuvant Chemotherapy   Taxol/Herceptin x 12 completed on 09/29/2017, went on to receive every 3 week adjuvant Herceptin through 06/28/2018   11/25/2017 - 01/07/2018 Radiation Therapy    The patient initially received a dose of 50.4 Gy in 28 fractions to the breast using whole-breast tangent fields. This was delivered using a 3-D conformal technique. The patient then received a boost to the seroma. This delivered an additional 10 Gy in 5 fractions using a 3-D technique. The total dose was 60.4 Gy.   02/15/2018 Genetic Testing   The Common Hereditary Cancer Panel offered by [facility] includes sequencing and/or deletion duplication testing of the following 47 genes: APC, ATM, AXIN2, BARD1, BMPR1A, BRCA1, BRCA2, BRIP1, CDH1,  CDKN2A (p14ARF), CDKN2A (p16INK4a), CKD4, CHEK2, CTNNA1, DICER1, EPCAM (Deletion/duplication testing only), GREM1 (promoter region deletion/duplication testing only), KIT, MEN1, MLH1, MSH2, MSH3, MSH6, MUTYH, NBN, NF1, NHTL1, PALB2, PDGFRA, PMS2, POLD1, POLE, PTEN, RAD50, RAD51C, RAD51D, SDHB, SDHC, SDHD, SMAD4, SMARCA4. STK11, TP53, TSC1, TSC2, and VHL.  The following genes were evaluated for sequence changes only: SDHA and HOXB13 c.251G>A variant only.   Results: POSITIVE. 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 The date of this test report is 02/15/2018   10/20/2019 PET scan   Bone mets involving Right hip, Bil Iliac bones, T8, T9, L2 and L3, Rt prox humerus, Righ glenoid. Some lytic and some sclerotic. 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 Some lytic and some sclerotic. Mild Left supraclav adenopathy, 2 small lesions basal ganglia (could be lacunar infarcts)     CHIEF COMPLIANT: F/U to discuss scans  INTERVAL HISTORY: Erin [name] is a 59 year old with the above-mentioned history of breast cancer and fallopian tube cancer.  She saw GYN oncology who obtained a PET CT scan to evaluate left supraclavicular swelling.  We got the results of the PET CT  scan which showed extensive bone metastases.  We asked her to come urgently to discuss these results.  She denies any significant pain in her low back.  She complains of very mild symptoms.    ALLERGIES:  has No Known Allergies.  MEDICATIONS:  Current Outpatient Medications  Medication Sig Dispense Refill  . Accu-Chek FastClix Lancets MISC Use as instructed. Inject into the skin twice daily 100 each 3  . atorvastatin (LIPITOR) 40 MG tablet Take 1 tablet (40 mg total) by mouth daily. 90 tablet 1  . Blood Glucose Calibration (ACCU-CHEK GUIDE CONTROL) LIQD 1 each by In Vitro route once as needed for up to 1 dose. 1 each 0  . cholecalciferol (VITAMIN D) 1000 units tablet Take 1,000 Units by mouth daily.    . ferrous sulfate 325 (65 FE) MG tablet Take 1 tablet (325 mg total) by mouth daily before breakfast. (Patient taking differently: Take 325 mg by mouth daily before breakfast. ) 90 tablet 1  . glimepiride (AMARYL) 2 MG tablet Take 1 tablet (2 mg total) by mouth daily with breakfast. To lower blood sugar (Patient taking differently: Take 2 mg by mouth daily with breakfast. To lower blood sugar) 90 tablet 3  . glucose blood (ACCU-CHEK GUIDE) test strip Use as instructed. Check blood glucose by fingerstick twice per day. 100 each 12  . ibuprofen (ADVIL) 800 MG tablet Take 1 tablet (800 mg total) by mouth every 8 (eight) hours as needed for moderate pain. For AFTER surgery only (Patient not taking: Reported on 10/05/2019) 30 tablet 0  . Lancet Devices (ACCU-CHEK SOFTCLIX) lancets Use as instructed 1 each 0  . lisinopril (ZESTRIL) 10 MG tablet Take 1 tablet (10 mg total) by mouth daily. To lower blood pressure (Patient taking differently: Take 10 mg by mouth daily. To lower blood pressure) 90 tablet 3  . metFORMIN (GLUCOPHAGE-XR) 500 MG 24 hr tablet TAKE 2 TABLETS BY MOUTH EVERY DAY WITH BREAKFAST (Patient taking differently: Take 1,000 mg by mouth at bedtime. ) 180 tablet 0  . methocarbamol (ROBAXIN) 500 MG tablet Take one pill every 8 hours during the day and two pills at bedtime as needed for muscle spasms. (Patient taking differently: Take 500 mg by mouth 2 (two) times daily. 10-05-2019 per  pt takes one in morning and two at bedtime Take one pill every 8 hours during the day and two pills at bedtime as needed for muscle spasms.) 90 tablet 0  . Omega-3 Fatty Acids (FISH OIL PO) Take 1 capsule by mouth daily.     No current facility-administered medications for this visit.    PHYSICAL EXAMINATION: ECOG PERFORMANCE STATUS: 1 - Symptomatic but completely ambulatory  Vitals:   10/23/19 1422  BP: 138/69  Pulse: 91  Resp: 18  Temp: 98.7 F (37.1 C)  SpO2: 100%   Filed Weights   10/23/19 1422  Weight: 160 lb (72.6 kg)     LABORATORY DATA:  I have reviewed the data as listed CMP Latest Ref Rng & Units 10/06/2019 09/06/2019 06/13/2019  Glucose 70 - 99 mg/dL 101(H) 97 90  BUN 6 - 20 mg/dL 8 14 6   Creatinine 0.44 - 1.00 mg/dL 0.70 0.77 0.73  Sodium 135 - 145 mmol/L 139 141 142  Potassium 3.5 - 5.1 mmol/L 4.2 4.1 4.0  Chloride 98 - 111 mmol/L 104 105 107  CO2 22 - 32 mmol/L 27 25 25   Calcium 8.9 - 10.3 mg/dL 9.2 9.2 9.4  Total Protein 6.5 - 8.1 g/dL -  7.5 -  Total Bilirubin 0.3 - 1.2 mg/dL - 0.2(L) -  Alkaline Phos 38 - 126 U/L - 96 -  AST 15 - 41 U/L - 18 -  ALT 0 - 44 U/L - 22 -    Lab Results  Component Value Date   WBC 8.3 10/06/2019   HGB 11.8 (L) 10/06/2019   HCT 38.3 10/06/2019   MCV 98.5 10/06/2019   PLT 284 10/06/2019   NEUTROABS 6.7 12/02/2018    ASSESSMENT & PLAN:  Malignant neoplasm of lower-outer quadrant of left breast of female, estrogen receptor negative ([facility]) Left lumpectomy: IDC grade 3, 1.2 cm, DCIS is present, margins negative, 0/6 lymph nodes negative, ER 0%, PR 0%, HER-2 positive ratio 2.28, Ki-67 20%, T1 BN 0 stage IA S/P Taxol-Herceptin BRCA 2 Mutation Positive  10/20/19: Bone mets involving Right hip, Bil Iliac bones, T8, T9, L2 and L3, Rt prox humerus, Righ glenoid. Some lytic and some sclerotic. Mild Left supraclav adenopathy, 2 small lesions basal ganglia (could be lacunar infarcts)  Radiology review: I discussed the scans and  showed the images. Plan: Biopsy the supra clav LN Potential options: Talazoparib vs Other treatmetns depending on the biopsy  RTC soon after the Biopsy is available     Orders Placed This Encounter  Procedures  . MR BRAIN W WO CONTRAST    Standing Status:   Future    Standing Expiration Date:   12/20/2020    Order Specific Question:   ** REASON FOR EXAM (FREE TEXT)    Answer:   Brain lesions on PET CT scan in basal ganglia    Order Specific Question:   If indicated for the ordered procedure, I authorize the administration of contrast media per Radiology protocol    Answer:   Yes    Order Specific Question:   What is the patient's sedation requirement?    Answer:   No Sedation    Order Specific Question:   Does the patient have a pacemaker or implanted devices?    Answer:   No    Order Specific Question:   Use SRS Protocol?    Answer:   Yes    Order Specific Question:   Radiology Contrast Protocol - do NOT remove file path    Answer:   [email or web address]    Order Specific Question:   Preferred imaging location?    Answer:   [facility] (table limit-350 lbs)  . [place] CORE BIOPSY (LYMPH NODES)    Standing Status:   Future    Standing Expiration Date:   12/20/2020    Order Specific Question:   Lab orders requested (DO NOT place separate lab orders, these will be automatically ordered during procedure specimen collection):    Answer:   Surgical Pathology    Order Specific Question:   Reason for Exam (SYMPTOM  OR DIAGNOSIS REQUIRED)    Answer:   Left supra clavicular Lymph node biopsy    Order Specific Question:   Preferred location?    Answer:   [facility]   The patient has a good understanding of the overall plan. she agrees with it. she will call with any problems that may develop before the next visit here. Total time spent: 30 mins including face to face time and time spent for planning, charting and co-ordination of care   Harriette Ohara, MD 10/23/19

## 2019-10-23 NOTE — Assessment & Plan Note (Signed)
Left lumpectomy: IDC grade 3, 1.2 cm, DCIS is present, margins negative, 0/6 lymph nodes negative, ER 0%, PR 0%, HER-2 positive ratio 2.28, Ki-67 20%, T1 BN 0 stage IA S/P Taxol-Herceptin BRCA 2 Mutation Positive  10/20/19: Bone mets involving Right hip, Bil Iliac bones, T8, T9, L2 and L3, Rt prox humerus, Righ glenoid. Some lytic and some sclerotic. Mild Left supraclav adenopathy, 2 small lesions basal ganglia (could be lacunar infarcts)  Radiology review: I discussed the scans and showed the images. Plan: Biopsy the supra clav LN Potential options: Talazoparib vs Other treatmetns depending on the biopsy  RTC soon after the Biopsy is available

## 2019-10-23 NOTE — Progress Notes (Signed)
Gynecologic Oncology Multi-Disciplinary Disposition Conference Note  Date of the Conference: 10/23/2019  Patient Name: Erin [name]  Referring Provider: Dr. Kennon Rounds Primary GYN Oncologist: Dr. 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 Disposition is for possible breast cancer recurrence. Recommendation is for appointment with Dr. [name] for salvage therapy..   This Multidisciplinary conference took place involving physicians from [place], Medical Oncology, Radiation Oncology, Pathology, Radiology along with the Gynecologic Oncology Nurse Practitioner and RN.  Comprehensive assessment of the patient's malignancy, staging, need for surgery, chemotherapy, radiation therapy, and need for further testing were reviewed. Supportive measures, both inpatient and following discharge were also discussed. The recommended plan of care is documented. Greater than 35 minutes were spent correlating and coordinating this patient's care.

## 2019-10-23 NOTE — Telephone Encounter (Signed)
Called pt for an appt with Dr. [name] to discuss pet scan results. Scheduled and confirmed appt for 2/8 at 2:15.

## 2019-10-25 ENCOUNTER — Encounter (HOSPITAL_COMMUNITY): Payer: Self-pay

## 2019-10-25 NOTE — Progress Notes (Unsigned)
Erin [name] Female, 59 y.o., [birth year] MRN:  [id number] Phone:  [phone] (H) Needs Interpreter: Sign Language PCP:  Antony Blackbird, MD Primary Cvg:  Medicare/Medicare Part A Next Appt With Radiology ([facility]-US 2) 10/31/2019 at 1:00 PM  RE: Biopsy Received: Yesterday Message Contents  Corrie Mckusick, DO  [facility]      OK for US guided left supraclavicular lymph node biopsy.   Earleen Newport   Previous Messages  ----- Message -----  From: Lenore Cordia  Sent: 10/24/2019 12:55 PM EST  To: [facility] Procedure Requests  Subject: Biopsy                      Procedure Requested: US Biopsy (Lymph Nodes)    Reason for Procedure: Left supra clavicular Lymph node biopsy    Provider Requesting: [name]  Provider Telephone: [phone]    Other Info: Rad exams in Epic

## 2019-10-29 ENCOUNTER — Other Ambulatory Visit: Payer: Self-pay | Admitting: Radiology

## 2019-10-31 ENCOUNTER — Other Ambulatory Visit: Payer: Self-pay

## 2019-10-31 ENCOUNTER — Ambulatory Visit (HOSPITAL_COMMUNITY)
Admission: RE | Admit: 2019-10-31 | Discharge: 2019-10-31 | Disposition: A | Discharge status: Home / Self Care | Payer: Medicaid Other | Source: Ambulatory Visit | Attending: Hematology and Oncology | Admitting: Hematology and Oncology

## 2019-10-31 ENCOUNTER — Encounter (HOSPITAL_COMMUNITY): Payer: Self-pay

## 2019-10-31 DIAGNOSIS — C7951 Secondary malignant neoplasm of bone: Secondary | ICD-10-CM | POA: Insufficient documentation

## 2019-10-31 DIAGNOSIS — Z8249 Family history of ischemic heart disease and other diseases of the circulatory system: Secondary | ICD-10-CM | POA: Insufficient documentation

## 2019-10-31 DIAGNOSIS — I10 Essential (primary) hypertension: Secondary | ICD-10-CM | POA: Insufficient documentation

## 2019-10-31 DIAGNOSIS — Z853 Personal history of malignant neoplasm of breast: Secondary | ICD-10-CM | POA: Diagnosis not present

## 2019-10-31 DIAGNOSIS — E785 Hyperlipidemia, unspecified: Secondary | ICD-10-CM | POA: Insufficient documentation

## 2019-10-31 DIAGNOSIS — Z9012 Acquired absence of left breast and nipple: Secondary | ICD-10-CM | POA: Insufficient documentation

## 2019-10-31 DIAGNOSIS — M7989 Other specified soft tissue disorders: Secondary | ICD-10-CM | POA: Diagnosis not present

## 2019-10-31 DIAGNOSIS — Z803 Family history of malignant neoplasm of breast: Secondary | ICD-10-CM | POA: Insufficient documentation

## 2019-10-31 DIAGNOSIS — C770 Secondary and unspecified malignant neoplasm of lymph nodes of head, face and neck: Secondary | ICD-10-CM | POA: Diagnosis not present

## 2019-10-31 DIAGNOSIS — Z171 Estrogen receptor negative status [ER-]: Secondary | ICD-10-CM | POA: Diagnosis not present

## 2019-10-31 DIAGNOSIS — K449 Diaphragmatic hernia without obstruction or gangrene: Secondary | ICD-10-CM | POA: Diagnosis not present

## 2019-10-31 DIAGNOSIS — Z833 Family history of diabetes mellitus: Secondary | ICD-10-CM | POA: Insufficient documentation

## 2019-10-31 DIAGNOSIS — D509 Iron deficiency anemia, unspecified: Secondary | ICD-10-CM | POA: Diagnosis not present

## 2019-10-31 DIAGNOSIS — M170 Bilateral primary osteoarthritis of knee: Secondary | ICD-10-CM | POA: Insufficient documentation

## 2019-10-31 DIAGNOSIS — Z7984 Long term (current) use of oral hypoglycemic drugs: Secondary | ICD-10-CM | POA: Insufficient documentation

## 2019-10-31 DIAGNOSIS — Z800 Family history of malignant neoplasm of digestive organs: Secondary | ICD-10-CM | POA: Diagnosis not present

## 2019-10-31 DIAGNOSIS — M19022 Primary osteoarthritis, left elbow: Secondary | ICD-10-CM | POA: Diagnosis not present

## 2019-10-31 DIAGNOSIS — G8929 Other chronic pain: Secondary | ICD-10-CM | POA: Insufficient documentation

## 2019-10-31 DIAGNOSIS — Z9221 Personal history of antineoplastic chemotherapy: Secondary | ICD-10-CM | POA: Diagnosis not present

## 2019-10-31 DIAGNOSIS — R590 Localized enlarged lymph nodes: Secondary | ICD-10-CM | POA: Diagnosis not present

## 2019-10-31 DIAGNOSIS — Z79899 Other long term (current) drug therapy: Secondary | ICD-10-CM | POA: Diagnosis not present

## 2019-10-31 DIAGNOSIS — M19021 Primary osteoarthritis, right elbow: Secondary | ICD-10-CM | POA: Insufficient documentation

## 2019-10-31 DIAGNOSIS — C50512 Malignant neoplasm of lower-outer quadrant of left female breast: Secondary | ICD-10-CM | POA: Diagnosis not present

## 2019-10-31 DIAGNOSIS — Z90710 Acquired absence of both cervix and uterus: Secondary | ICD-10-CM | POA: Insufficient documentation

## 2019-10-31 LAB — CBC WITH DIFFERENTIAL/PLATELET
Abs Immature Granulocytes: 0.04 10*3/uL (ref 0.00–0.07)
Basophils Absolute: 0 10*3/uL (ref 0.0–0.1)
Basophils Relative: 0 %
Eosinophils Absolute: 0 10*3/uL (ref 0.0–0.5)
Eosinophils Relative: 1 %
HCT: 38.4 % (ref 36.0–46.0)
Hemoglobin: 12.4 g/dL (ref 12.0–15.0)
Immature Granulocytes: 1 %
Lymphocytes Relative: 28 %
Lymphs Abs: 2.5 10*3/uL (ref 0.7–4.0)
MCH: 30.6 pg (ref 26.0–34.0)
MCHC: 32.3 g/dL (ref 30.0–36.0)
MCV: 94.8 fL (ref 80.0–100.0)
Monocytes Absolute: 0.6 10*3/uL (ref 0.1–1.0)
Monocytes Relative: 7 %
Neutro Abs: 5.6 10*3/uL (ref 1.7–7.7)
Neutrophils Relative %: 63 %
Platelets: 298 10*3/uL (ref 150–400)
RBC: 4.05 MIL/uL (ref 3.87–5.11)
RDW: 13.9 % (ref 11.5–15.5)
WBC: 8.8 10*3/uL (ref 4.0–10.5)
nRBC: 0 % (ref 0.0–0.2)

## 2019-10-31 LAB — PROTIME-INR
INR: 0.9 (ref 0.8–1.2)
Prothrombin Time: 12.1 seconds (ref 11.4–15.2)

## 2019-10-31 LAB — GLUCOSE, CAPILLARY: Glucose-Capillary: 85 mg/dL (ref 70–99)

## 2019-10-31 MED ORDER — SODIUM CHLORIDE 0.9 % IV SOLN: INTRAVENOUS | Status: DC

## 2019-10-31 MED ORDER — CEFAZOLIN SODIUM-DEXTROSE 2-4 GM/100ML-% IV SOLN: 2.0000 g | INTRAVENOUS | Status: DC

## 2019-10-31 MED ORDER — MIDAZOLAM HCL 2 MG/2ML IJ SOLN
INTRAMUSCULAR | Status: DC | PRN
  Administered 2019-10-31 (×2): 1 mg via INTRAVENOUS

## 2019-10-31 MED ORDER — HYDROCODONE-ACETAMINOPHEN 5-325 MG PO TABS: 1.0000 | ORAL_TABLET | Freq: Four times a day (QID) | ORAL | 0 refills | Status: DC | PRN

## 2019-10-31 MED ORDER — FENTANYL CITRATE (PF) 100 MCG/2ML IJ SOLN
INTRAMUSCULAR | Status: AC
  Filled 2019-10-31: qty 2

## 2019-10-31 MED ORDER — LIDOCAINE HCL 1 % IJ SOLN
INTRAMUSCULAR | Status: AC
  Filled 2019-10-31: qty 20

## 2019-10-31 MED ORDER — HYDROCODONE-ACETAMINOPHEN 5-325 MG PO TABS
ORAL_TABLET | ORAL | Status: AC
  Administered 2019-10-31: 2
  Filled 2019-10-31: qty 2

## 2019-10-31 MED ORDER — HYDROCODONE-ACETAMINOPHEN 5-325 MG PO TABS: 1.0000 | ORAL_TABLET | ORAL | Status: DC | PRN

## 2019-10-31 MED ORDER — LIDOCAINE HCL (PF) 1 % IJ SOLN
INTRAMUSCULAR | Status: DC | PRN
  Administered 2019-10-31: 10 mL

## 2019-10-31 MED ORDER — MIDAZOLAM HCL 2 MG/2ML IJ SOLN
INTRAMUSCULAR | Status: AC
  Filled 2019-10-31: qty 4

## 2019-10-31 MED ORDER — FENTANYL CITRATE (PF) 100 MCG/2ML IJ SOLN
INTRAMUSCULAR | Status: DC | PRN
  Administered 2019-10-31 (×2): 50 ug via INTRAVENOUS

## 2019-10-31 NOTE — Discharge Instructions (Signed)

## 2019-10-31 NOTE — Progress Notes (Addendum)
Contacted Dr. [name]: patient with  continued pain at biopsy site. Provided ice pack to site without any improvement.  No bruising; slight swelling.    Orders for pain given.

## 2019-10-31 NOTE — Progress Notes (Signed)
Patient ID: Erin [name], female   DOB: 1961/01/23, 59 y.o.   MRN: [id number] Per order of Dr. [name], Petal prescription sent to pt's pharmacy for [place] 5/325,#10, no refills, 1-2 tabs every 6 hrs as needed for moderate pain. Pt updated.

## 2019-10-31 NOTE — Consult Note (Signed)
Chief Complaint: Patient was seen in consultation today for image guided left supraclavicular lymph node biopsy  Referring Physician(s): [place]  Supervising Physician: Aletta Edouard  Patient Status: [facility] - Out-pt  History of Present Illness: Erin [name] is a 59 y.o. female with history of left breast carcinoma in 2018 with prior lumpectomy and chemoradiation as well as right fallopian tube carcinoma in 2019, status post hysterectomy/BSO/chemotherapy.  Follow-up imaging/PET scan on 10/20/2019 revealed: 1. Scattered active osseous metastatic lesions as noted above. 2. The mild left supraclavicular adenopathy and surrounding hazy stranding has a maximum SUV of 5.0, probably representing active malignancy. 3. Two small hypodense lesions along the left basal ganglia in the brain, probably small lacunar infarcts. Possibilities for further imaging workup might include CT head or MRI brain with and without contrast, depending on the level of concern for possibility of intracranial metastatic disease. 4. Other imaging findings of potential clinical significance: Small type 1 hiatal hernia. Supraumbilical hernia contains adipose tissue.  She presents today for image guided left supraclavicular lymph node biopsy for further evaluation.  Past Medical History:  Diagnosis Date  . Arthritis    knees, elbows  . Borderline glaucoma of right eye   . BRCA2 gene mutation positive 02/17/2018   BRCA2 c.4552del (p.[id number]*25)  Result reported out on 02/15/2018.   . Breast cancer ([place]) 05/2017   left  . Chronic back pain   . Deaf    per pt born hearing and at age 29 1/2 lost hearing , was told by mother unknown cause, can miminally hear in left and no hearing on right   . Depression   . Dyspnea    states some SOB with ADLs and anemia   . Elevated cancer antigen 125 (CA 125)   . Family history of breast cancer   . Family history of breast cancer   . Family history of colon cancer     . Genetic testing 02/17/2018   The Common Hereditary Cancer Panel offered by [facility] includes sequencing and/or deletion duplication testing of the following 47 genes: APC, ATM, AXIN2, BARD1, BMPR1A, BRCA1, BRCA2, BRIP1, CDH1, CDKN2A (p14ARF), CDKN2A (p16INK4a), CKD4, CHEK2, CTNNA1, DICER1, EPCAM (Deletion/duplication testing only), GREM1 (promoter region deletion/duplication testing only), KIT, MEN1, MLH1, MSH2, MSH3, MSH6, MU  . History of cancer chemotherapy    left breast cancer 09-08-2017 to 10-09-2017;  fallopion tube cancer  12-15-2017  to 06-28-2018  . 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 History of external beam radiation therapy    left breast  11-25-2017  to 01-07-2018  . Hyperlipidemia   . Hypertension    followed by pcp   (10-05-2019  per pt never had stress test)  . IDA (iron deficiency anemia)   . 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 Non-insulin dependent type 2 diabetes mellitus ([place])    followed by pcp   (10-05-2019 per pt check cbg every other day in AM,  fasting cbg-- 105)  . Numbness of right thumb   . Wears glasses  Past Surgical History:  Procedure Laterality Date  . BREAST LUMPECTOMY WITH RADIOACTIVE SEED AND SENTINEL LYMPH NODE BIOPSY Left 06/09/2017   Procedure: BREAST LUMPECTOMY WITH RADIOACTIVE SEED AND SENTINEL LYMPH NODE BIOPSY;  Surgeon: Excell Seltzer, MD;  Location: [facility];  Service: General;  Laterality: Left;  . COLONOSCOPY  11/02/2017   polyps  .  LAPAROSCOPIC ASSISTED VAGINAL HYSTERECTOMY N/A 11/04/2017   Procedure: LAPAROSCOPIC ASSISTED VAGINAL HYSTERECTOMY;  Surgeon: [name], MD;  Location: [facility];  Service: Gynecology;  Laterality: N/A;  . LAPAROSCOPIC BILATERAL SALPINGO OOPHERECTOMY Bilateral 11/04/2017   Procedure: LAPAROSCOPIC BILATERAL SALPINGO OOPHORECTOMY;  Surgeon: [name], MD;  Location: [facility];  Service: Gynecology;  Laterality: Bilateral;  . LAPAROSCOPY N/A 10/09/2019   Procedure: LAPAROSCOPY DIAGNOSTIC WITH PERITONEAL BIOPSIES;  Surgeon: [name], MD;  Location: [facility];  Service: Gynecology;  Laterality: N/A;  . PORTACATH PLACEMENT Right 06/09/2017   Procedure: INSERTION PORT-A-CATH WITH [place];  Surgeon: Excell Seltzer, MD;  Location: [facility];  Service: General;  Laterality: Right;  . TUBAL LIGATION  02/02/2002   @[facility]    [facility]  . UPPER GI ENDOSCOPY      Allergies: Patient has no known allergies.  Medications: Prior to Admission medications   Medication Sig Start Date End Date Taking? Authorizing Provider  Accu-Chek FastClix Lancets MISC Use as instructed. Inject into the skin twice daily 12/26/18   [name], NP  atorvastatin (LIPITOR) 40 MG tablet Take 1 tablet (40 mg total) by mouth daily. 06/23/19   Fulp, Cammie, MD  Blood Glucose Calibration (ACCU-CHEK GUIDE CONTROL) LIQD 1 each by In Vitro route once as needed for up to 1 dose. 12/26/18   [name], NP  cholecalciferol (VITAMIN D) 1000 units tablet Take 1,000 Units by mouth daily.    [provider]  ferrous sulfate 325 (65 FE) MG tablet Take 1 tablet (325 mg total) by mouth daily before breakfast. Patient taking differently: Take 325 mg by mouth daily before breakfast.  06/23/19 10/05/19  Fulp, Cammie, MD  glimepiride (AMARYL) 2 MG tablet Take 1 tablet (2 mg total) by mouth daily with breakfast. To lower blood sugar Patient taking differently: Take 2 mg by mouth daily with breakfast. To lower  blood sugar 08/04/19   Fulp, Cammie, MD  glucose blood (ACCU-CHEK GUIDE) test strip Use as instructed. Check blood glucose by fingerstick twice per day. 12/26/18   [name], NP  ibuprofen (ADVIL) 800 MG tablet Take 1 tablet (800 mg total) by mouth every 8 (eight) hours as needed for moderate pain. For AFTER surgery only Patient not taking: Reported on 10/05/2019 09/28/19   Joylene John D, NP  Lancet Devices [facility]) lancets Use as instructed 12/17/14   Lance Bosch, NP  lisinopril (ZESTRIL) 10 MG tablet Take 1 tablet (10 mg total) by mouth daily. To lower blood pressure Patient taking differently: Take 10 mg by mouth daily. To lower blood pressure 08/24/19   Fulp, Cammie, MD  metFORMIN (GLUCOPHAGE-XR) 500 MG 24 hr tablet TAKE 2 TABLETS BY MOUTH EVERY DAY WITH BREAKFAST Patient taking differently: Take 1,000 mg by mouth at bedtime.  08/22/19   Fulp, Cammie, MD  methocarbamol (ROBAXIN) 500 MG tablet Take one pill every 8 hours during the day and two pills at bedtime as needed for muscle spasms. Patient taking differently: Take 500 mg by mouth 2 (two) times daily. 10-05-2019 per pt takes one in morning and two at bedtime Take one  pill every 8 hours during the day and two pills at bedtime as needed for muscle spasms. 10/02/19   Camillia Herter, NP  Omega-3 Fatty Acids (FISH OIL PO) Take 1 capsule by mouth daily.    [provider]     Family History  Problem Relation Age of Onset  . Hypertension Mother   . Diabetes Father   . Hypertension Sister   . Diabetes Paternal Grandmother   . Colon cancer Paternal Grandmother 59  . CAD Brother   . Diabetes Brother   . Breast cancer Maternal Aunt        dx >50  . Heart attack Paternal Grandfather   . Breast cancer Maternal Aunt        dx under 59  . Breast cancer Maternal Aunt        dx  under 51    Social History   Socioeconomic History  . Marital status: Legally Separated    Spouse name: Not on file  . Number of  children: Not on file  . Years of education: Not on file  . Highest education level: Not on file  Occupational History  . Not on file  Tobacco Use  . Smoking status: Never Smoker  . Smokeless tobacco: Never Used  Substance and Sexual Activity  . Alcohol use: No  . Drug use: Never  . Sexual activity: Not on file  Other Topics Concern  . Not on file  Social History Narrative   1 boy and 1 girl   Resides in [place] Determinants of Health   Financial Resource Strain:   . Difficulty of Paying Living Expenses: Not on file  Food Insecurity:   . Worried About Charity fundraiser in the Last Year: Not on file  . Ran Out of Food in the Last Year: Not on file  Transportation Needs:   . Lack of Transportation (Medical): Not on file  . Lack of Transportation (Non-Medical): Not on file  Physical Activity:   . Days of Exercise per Week: Not on file  . Minutes of Exercise per Session: Not on file  Stress:   . Feeling of Stress : Not on file  Social Connections:   . Frequency of Communication with Friends and Family: Not on file  . Frequency of Social Gatherings with Friends and Family: Not on file  . Attends Religious Services: Not on file  . Active Member of Clubs or Organizations: Not on file  . Attends Archivist Meetings: Not on file  . Marital Status: Not on file      Review of Systems denies fever,HA,CP,dyspnea, cough, abd pain,N/V or bleeding; she does have some back pain; she is deaf  Vital Signs: BP (!) 137/92 (BP Location: Left Arm)   Pulse 94   Temp 98.7 F (37.1 C) (Oral)   Resp 18   LMP 07/09/2017 (LMP Unknown)   SpO2 97%   Physical Exam awake/alert; chest- CTA bilat; heart- RRR; abd- soft,+BS,NT; no LE edema  Imaging: CT Chest W Contrast  Result Date: 10/03/2019 CLINICAL DATA:  Elevated CA 125, ovarian cancer, evaluate for pulmonary metastatic disease EXAM: CT CHEST WITH CONTRAST TECHNIQUE: Multidetector CT imaging of the  chest was performed during intravenous contrast administration. CONTRAST:  51m OMNIPAQUE IOHEXOL 300 MG/ML  SOLN COMPARISON:  CT abdomen pelvis, 09/14/2019, CT chest abdomen pelvis, 01/12/2018 FINDINGS: Cardiovascular: No significant vascular findings. Normal heart size. No pericardial effusion. Mediastinum/Nodes: There are enlarged and  ill-defined left lower cervical/supraclavicular lymph nodes, difficult to discretely measure although at least 1.3 x 0.7 cm, new when compared to prior examination dated 01/12/2018 (series 2, image 16). No enlarged lower mediastinal, hilar, or axillary lymph nodes. Small hiatal hernia. Thyroid gland, trachea, and esophagus demonstrate no significant findings. Lungs/Pleura: Lungs are clear. No pleural effusion or pneumothorax. Upper Abdomen: No acute abnormality. Hepatic steatosis. Musculoskeletal: No chest wall mass or suspicious bone lesions identified. Surgical clips in the left breast. IMPRESSION: 1. There are enlarged and ill-defined left lower cervical/supraclavicular lymph nodes, difficult to discretely measure although at least 1.3 x 0.7 cm, new when compared to prior examination dated 01/12/2018 (series 2, image 16). Although nonspecific, these are suspicious for metastatic disease given known history of malignancy and otherwise unexplained rising CA 125. PET-CT may be helpful to assess for metabolic activity and tissue sampling can be considered. Given location, these may be accessible under ultrasound. 2.  No evidence of metastatic disease within the chest cavity. 3.  Hepatic steatosis. Electronically Signed   By: Eddie Candle M.D.   On: 10/03/2019 15:45   MR BREAST BILATERAL W WO CONTRAST INC CAD  Result Date: 10/17/2019 CLINICAL DATA:  59 year old BRCA 2 positive female status post left lumpectomy with chemotherapy and radiation in 2018. The patient presents today for follow-up MRI after benign right breast MRI guided biopsy in March 2020. LABS:  Creatinine was  obtained on site at [facility] at [address] Results: Creatinine 0.6 mg/dL. EXAM: BILATERAL BREAST MRI WITH AND WITHOUT CONTRAST TECHNIQUE: Multiplanar, multisequence MR images of both breasts were obtained prior to and following the intravenous administration of 7 ml of Gadavist. Three-dimensional MR images were rendered by post-processing of the original MR data on an independent workstation. The three-dimensional MR images were interpreted, and findings are reported in the following complete MRI report for this study. Three dimensional images were evaluated at the independent DynaCad workstation COMPARISON:  Previous exam(s). FINDINGS: Breast composition: b. Scattered fibroglandular tissue. Background parenchymal enhancement: Mild. Right breast: No suspicious mass or abnormal enhancement. Susceptibility artifact from post biopsy clips noted in the central and medial aspects. Left breast: Postsurgical changes are noted in the lower outer quadrant spanning from anterior to posterior depth. Nodular enhancement is seen along the anterior margin of an area of fat necrosis within this region at middle depth (series 7, image 100/144). This measures approximately 8 mm in greatest dimension. No additional suspicious findings within the remainder of the left breast. Lymph nodes: No abnormal appearing lymph nodes. Ancillary findings:  None. IMPRESSION: 1. Indeterminate, nodular enhancement along the anterior margin of postoperative changes in the lower outer left breast (series 7, image 100/144). While this could represent evolving changes of fat necrosis, DCIS cannot be excluded. Recommendation is for MRI guided biopsy. 2. Post biopsy changes without MRI evidence of malignancy on the right. 3. No suspicious lymphadenopathy. RECOMMENDATION: MRI guided biopsy of the left breast. BI-RADS CATEGORY  4: Suspicious. Electronically Signed   By: Kristopher Oppenheim M.D.   On: 10/17/2019 12:27   NM PET Image Initial  (PI) Skull Base To Thigh  Result Date: 10/20/2019 CLINICAL DATA:  Subsequent treatment strategy for ovarian cancer. History of breast cancer. EXAM: NUCLEAR MEDICINE PET SKULL BASE TO THIGH TECHNIQUE: 8.0 mCi F-18 FDG was injected intravenously. Full-ring PET imaging was performed from the skull base to thigh after the radiotracer. CT data was obtained and used for attenuation correction and anatomic localization. Fasting blood glucose: 90 mg/dl COMPARISON:  Multiple exams, including CT scans from 10/03/2019 and 09/14/2019 FINDINGS: Mediastinal blood pool activity: SUV max 2.7 Liver activity: SUV max NA NECK: No significant abnormal hypermetabolic activity in this region. Incidental CT findings: Small hypodense lesion in the vicinity of the left lentiform nucleus about 0.7 cm in diameter on image 3/4, probably a small lacunar infarct or dilated perivascular space. Questionable similar lesion in the head of the left caudate nucleus, image 2/4. CHEST: Hazy left supraclavicular adenopathy with individual nodes measuring up to 0.7 cm in short axis on image 47/4 (previously 0.8 cm) with maximum SUV 5.0, suspicious for malignancy. Incidental CT findings: Small type 1 hiatal hernia. Postoperative findings in the left breast. ABDOMEN/PELVIS: Physiologic bowel activity. Incidental CT findings: Supraumbilical hernia contains adipose tissue, image 119/4. SKELETON: Scattered osseous metastatic disease no including the right hip greater trochanter, bilateral iliac bones, several vertebra most notably T8, T9, L2, and L3; and in the right proximal humerus and likely the right glenoid. There is some muscular activity posterior to the left humeral head. On the CT data, some of the osseous metastatic lesions are lytic and some are sclerotic, whereas others are occult. The primarily lytic destructive lesion along the upper endplate of L3 has a maximum SUV of 16.9. The right greater trochanteric lesion has maximum SUV of 10.9.  Incidental CT findings: none IMPRESSION: 1. Scattered active osseous metastatic lesions as noted above. 2. The mild left supraclavicular adenopathy and surrounding hazy stranding has a maximum SUV of 5.0, probably representing active malignancy. 3. Two small hypodense lesions along the left basal ganglia in the brain, probably small lacunar infarcts. Possibilities for further imaging workup might include CT head or MRI brain with and without contrast, depending on the level of concern for possibility of intracranial metastatic disease. 4. Other imaging findings of potential clinical significance: Small type 1 hiatal hernia. Supraumbilical hernia contains adipose tissue. Electronically Signed   By: Van Clines M.D.   On: 10/20/2019 10:45    Labs:  CBC: Recent Labs    12/02/18 1751 10/06/19 0937  WBC 10.5 8.3  HGB 11.4* 11.8*  HCT 36.0 38.3  PLT 283 284    COAGS: No results for input(s): INR, APTT in the last 8760 hours.  BMP: Recent Labs    04/06/19 1211 06/13/19 0905 09/06/19 1358 10/06/19 0937  NA 143 142 141 139  K 4.8 4.0 4.1 4.2  CL 102 107 105 104  CO2 24 25 25 27   GLUCOSE 101* 90 97 101*  BUN 9 6 14 8   CALCIUM 9.7 9.4 9.2 9.2  CREATININE 0.75 0.73 0.77 0.70  GFRNONAA 90 >60 >60 >60  GFRAA 104 >60 >60 >60    LIVER FUNCTION TESTS: Recent Labs    04/06/19 1211 09/06/19 1358  BILITOT 0.3 0.2*  AST 18 18  ALT 22 22  ALKPHOS 101 96  PROT 7.5 7.5  ALBUMIN 4.3 3.8    TUMOR MARKERS: No results for input(s): AFPTM, CEA, CA199, CHROMGRNA in the last 8760 hours.  Assessment and Plan: 59 y.o. female with history of left breast carcinoma in 2018 with prior lumpectomy and chemoradiation as well as right fallopian tube carcinoma in 2019, status post hysterectomy/BSO/chemotherapy.  Follow-up imaging/PET scan on 10/20/2019 revealed: 1. Scattered active osseous metastatic lesions as noted above. 2. The mild left supraclavicular adenopathy and surrounding  hazy stranding has a maximum SUV of 5.0, probably representing active malignancy. 3. Two small hypodense lesions along the left basal ganglia in the brain, probably small lacunar  infarcts. Possibilities for further imaging workup might include CT head or MRI brain with and without contrast, depending on the level of concern for possibility of intracranial metastatic disease. 4. Other imaging findings of potential clinical significance: Small type 1 hiatal hernia. Supraumbilical hernia contains adipose tissue.  She presents today for image guided left supraclavicular lymph node biopsy for further evaluation.Risks and benefits of procedure was discussed with the patient via sign language interpreter including, but not limited to bleeding, infection, damage to adjacent structures or low yield requiring additional tests.  All of the questions were answered and there is agreement to proceed.  Consent signed and in chart.     Thank you for this interesting consult.  I greatly enjoyed meeting Erin [name] and look forward to participating in their care.  A copy of this report was sent to the requesting provider on this date.  Electronically Signed: D. Rowe Robert, PA-C 10/31/2019, 12:01 PM   I spent a total of 25 minutes in face to face in clinical consultation, greater than 50% of which was counseling/coordinating care for image guided left supraclavicular lymph node biopsy

## 2019-10-31 NOTE — Procedures (Signed)
Interventional Radiology Procedure Note  Procedure: US Guided Biopsy of left neck soft tissue  Complications: None  Estimated Blood Loss: < 10 mL  Findings: 18 G core biopsy of left neck soft tissue performed under US guidance.  Three core samples obtained and sent to Pathology.  Venetia Night. [name], M.D Pager:  [phone]

## 2019-11-01 ENCOUNTER — Other Ambulatory Visit: Payer: Medicaid Other

## 2019-11-02 ENCOUNTER — Ambulatory Visit (HOSPITAL_COMMUNITY)
Admission: RE | Admit: 2019-11-02 | Discharge: 2019-11-02 | Disposition: A | Discharge status: Home / Self Care | Payer: Medicaid Other | Source: Ambulatory Visit | Attending: Hematology and Oncology | Admitting: Hematology and Oncology

## 2019-11-02 ENCOUNTER — Other Ambulatory Visit: Payer: Self-pay

## 2019-11-02 DIAGNOSIS — Z171 Estrogen receptor negative status [ER-]: Secondary | ICD-10-CM | POA: Insufficient documentation

## 2019-11-02 DIAGNOSIS — C50512 Malignant neoplasm of lower-outer quadrant of left female breast: Secondary | ICD-10-CM | POA: Insufficient documentation

## 2019-11-02 MED ORDER — GADOBUTROL 1 MMOL/ML IV SOLN
7.0000 mL | Freq: Once | INTRAVENOUS | Status: AC | PRN
  Administered 2019-11-02: 08:00:00 7 mL via INTRAVENOUS

## 2019-11-03 ENCOUNTER — Other Ambulatory Visit: Payer: Self-pay

## 2019-11-03 ENCOUNTER — Encounter: Payer: Self-pay | Admitting: Gynecologic Oncology

## 2019-11-03 ENCOUNTER — Inpatient Hospital Stay (HOSPITAL_BASED_OUTPATIENT_CLINIC_OR_DEPARTMENT_OTHER): Payer: Medicaid Other | Admitting: Gynecologic Oncology

## 2019-11-03 VITALS — BP 118/63 | HR 112 | Temp 98.7°F | Resp 18 | Wt 159.5 lb

## 2019-11-03 DIAGNOSIS — C7951 Secondary malignant neoplasm of bone: Secondary | ICD-10-CM | POA: Diagnosis not present

## 2019-11-03 DIAGNOSIS — C50512 Malignant neoplasm of lower-outer quadrant of left female breast: Secondary | ICD-10-CM

## 2019-11-03 DIAGNOSIS — Z171 Estrogen receptor negative status [ER-]: Secondary | ICD-10-CM

## 2019-11-03 DIAGNOSIS — Z148 Genetic carrier of other disease: Secondary | ICD-10-CM

## 2019-11-03 DIAGNOSIS — C5701 Malignant neoplasm of right fallopian tube: Secondary | ICD-10-CM

## 2019-11-03 DIAGNOSIS — R971 Elevated cancer antigen 125 [CA 125]: Secondary | ICD-10-CM

## 2019-11-03 DIAGNOSIS — C7989 Secondary malignant neoplasm of other specified sites: Secondary | ICD-10-CM | POA: Insufficient documentation

## 2019-11-03 DIAGNOSIS — Z7189 Other specified counseling: Secondary | ICD-10-CM

## 2019-11-03 DIAGNOSIS — C770 Secondary and unspecified malignant neoplasm of lymph nodes of head, face and neck: Secondary | ICD-10-CM | POA: Insufficient documentation

## 2019-11-03 DIAGNOSIS — Z90722 Acquired absence of ovaries, bilateral: Secondary | ICD-10-CM

## 2019-11-03 NOTE — Progress Notes (Signed)
Follow-up Note: Gyn-Onc  Consult was initially requested by Dr. Kennon Rounds for the evaluation of Erin [name] 59 y.o. female  CC:  Chief Complaint  Patient presents with  . history of Fallopian tube cancer, carcinoma, right ([place])  . Elevated CA-125  . recurrent breast cancer    Assessment/Plan:  Erin. Erin [name]  is a 59 y.o.  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   S/p 6 cycles adjuvant carboplatin/taxol chemotherapy between April 2019-October, 2019.   Elevated CA 125, PET shows multiple axial and peripheral skeletal mets and supraclav nodes consistent with recurrent breast cancer. Negative diagnostic laparoscopy.  I am recommending treatment according to recurrent breast cancer algorithms.  She does not need ongoing surveillance for FT cancer while on active breast cancer treatment.   HPI: Erin [name] is a 59 year old woman who is deaf and is seen in consultation at the request of Dr Kennon Rounds for clinical stage IC fallopian tube cancer.   The patient has a history of ER PR negative HER-2 positive breast cancer on the left side which was diagnosed in August 2018.  It was treated with lumpectomy in September 2018 followed by Adriamycin and Taxol chemotherapy.  She was then placed on Herceptin with a plan to commence radiation therapy in March 2019.  She reported menorrhagia with irregular menses and therefore transvaginal ultrasound was performed on June 29, 2017.  It showed a uterus measuring 11.8 x 5.2 x 6.3 cm with a septated fluid-filled structure adjacent to the dome of the uterus.  The endometrium was thickened at 10.2 mm the right ovary measured 3.2 x 2.1 x 2.7 cm and the left ovary measured 9.8 x 7.8 x 11.8 cm.  The ovarian tissue is largely occupied by a complex cyst measuring 7.5 x 9.9 x 9.6 cm with a prominent septation.  There was no free fluid  identified.  To follow-up with this ultrasound Dr. Kennon Rounds obtained an MRI of the pelvis on September 06, 2017 which revealed a uterus of normal size measuring 7.5 x 5 x 4.5 cm.  There is a bilobed cystic lesion presumably originating from the left adnexa which is different from the ultrasound report of a right adnexal complex cyst.  The cystic complex measures 12.8 x 10.9 x 8 cm.  The there was no nodularity.  The right ovary appeared normal.  It had no worrisome features.  Ca1 25 was drawn which was normal.  The patient was taken to the operating room on November 04, 2017 for laparoscopic assisted vaginal hysterectomy with BSO.  Surgical findings were significant for an 8-week size uterus with normal adnexa on the left and the right adnexal mass measured personally 12 cm.  It felt to be most consistent with a paratubal cyst.  There is normal upper abdomen reported washings were not obtained.  Surgery was uncomplicated.  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  The remainder of the entire right fallopian tube was submitted and no additional malignancy was identified.  The the associated ovarian cyst on the right was benign the left adnexa was unremarkable.  The uterus was also normal including the endometrium.  The patient is healed well from her surgery with no complaints.  She has a paternal grandmother who had colon  cancer, a maternal half aunt with history of breast cancer, 2 paternal aunts with breast cancer.  There is no family history of ovarian cancer.  She has had 2 prior NSVDs in the past.  She is otherwise fairly healthy.  She underwent genetic testing in June, 2019 which revealed a deleterious mutation in BRCA 2.   She received 6 cycles of carboplatin and paclitaxel chemotherapy between April, 2019 and October 2019.  She is on Herceptin maintenance therapy for her breast  cancer.   She had not had CA 125 checked postop including throughout chemotherapy. CA 125 was drawn as surveillance on 02/27/19 and was 31.  CA 125 was drawn on 06/09/19 and was elevated at 218.  This prompted her to have a CT scan of the abdomen and pelvis with IV contrast which revealed hepatomegaly without biliary duct dilation.  Tiny hiatal hernia.  Small left paramidline fat-containing ventral abdominal wall hernia but no evidence of metastatic disease in the abdomen or pelvis.  Repeat Ca1 25 on September 05, 2019 was significantly elevated at 1088.  Due to this elevation c-Met was drawn to rule out occult liver disease as an explanation for her elevated tumor marker, this was normal for normal liver function tests and renal function.  Repeat CT scan of the abdomen and pelvis was performed on September 14, 2019.  This revealed a normal-appearing liver, spleen, tiny hiatal hernia, normal appendix, no pathologically enlarged lymph nodes, no evidence of peritoneal soft tissue thickening or ascites, surgically absent gynecologic organs, small epigastric ventral hernia seen containing fat.  No evidence of recurrent or metastatic carcinoma in the abdomen or pelvis.  Interval Hx:  Diagnostic laparoscopy with washings and biopsies on 10/09/19 was negative and benign.  PET CT on 10/20/2019 revealed multiple skeletal bony metastases including in the lumbar and thoracic vertebrae, bilateral iliac crest, right femoral head, right humeral head, and left humeral muscular tissues. Additionally there was left supraclavicular nodal positivity on PET. This was consistent clinically with recurrent breast cancer.  Since surgery she has done well with no specific complaints. She is awaiting biopsy of the left supraclavicular node to confirm recurrent breast cancer.   Current Meds:  Outpatient Encounter Medications as of 11/03/2019  Medication Sig  . Accu-Chek FastClix Lancets MISC Use as instructed. Inject into  the skin twice daily  . atorvastatin (LIPITOR) 40 MG tablet Take 1 tablet (40 mg total) by mouth daily.  . Blood Glucose Calibration (ACCU-CHEK GUIDE CONTROL) LIQD 1 each by In Vitro route once as needed for up to 1 dose.  . cholecalciferol (VITAMIN D) 1000 units tablet Take 1,000 Units by mouth daily.  . ferrous sulfate 325 (65 FE) MG tablet Take 1 tablet (325 mg total) by mouth daily before breakfast. (Patient taking differently: Take 325 mg by mouth daily before breakfast. )  . glimepiride (AMARYL) 2 MG tablet Take 1 tablet (2 mg total) by mouth daily with breakfast. To lower blood sugar (Patient taking differently: Take 2 mg by mouth daily with breakfast. To lower blood sugar)  . glucose blood (ACCU-CHEK GUIDE) test strip Use as instructed. Check blood glucose by fingerstick twice per day.  [employer] Devices (ACCU-CHEK SOFTCLIX) lancets Use as instructed  . lisinopril (ZESTRIL) 10 MG tablet Take 1 tablet (10 mg total) by mouth daily. To lower blood pressure (Patient taking differently: Take 10 mg by mouth daily. To lower blood pressure)  . metFORMIN (GLUCOPHAGE-XR) 500 MG 24 hr tablet TAKE 2 TABLETS BY MOUTH EVERY DAY  WITH BREAKFAST (Patient taking differently: Take 1,000 mg by mouth at bedtime. )  . methocarbamol (ROBAXIN) 500 MG tablet Take one pill every 8 hours during the day and two pills at bedtime as needed for muscle spasms. (Patient taking differently: Take 500 mg by mouth 2 (two) times daily. 10-05-2019 per pt takes one in morning and two at bedtime Take one pill every 8 hours during the day and two pills at bedtime as needed for muscle spasms.)  . [DISCONTINUED] HYDROcodone-acetaminophen (NORCO) 5-325 MG tablet Take 1-2 tablets by mouth every 6 (six) hours as needed for moderate pain. (Patient not taking: Reported on 11/03/2019)  . [DISCONTINUED] ibuprofen (ADVIL) 800 MG tablet Take 1 tablet (800 mg total) by mouth every 8 (eight) hours as needed for moderate pain. For AFTER surgery only  (Patient not taking: Reported on 11/03/2019)  . [DISCONTINUED] Omega-3 Fatty Acids (FISH OIL PO) Take 1 capsule by mouth daily.   No facility-administered encounter medications on file as of 11/03/2019.    Allergy: No Known Allergies  Social Hx:   Social History   Socioeconomic History  . Marital status: Legally Separated    Spouse name: Not on file  . Number of children: Not on file  . Years of education: Not on file  . Highest education level: Not on file  Occupational History  . Not on file  Tobacco Use  . Smoking status: Never Smoker  . Smokeless tobacco: Never Used  Substance and Sexual Activity  . Alcohol use: No  . Drug use: Never  . Sexual activity: Not on file  Other Topics Concern  . Not on file  Social History Narrative   1 boy and 1 girl   Resides in [place] Determinants of Health   Financial Resource Strain:   . Difficulty of Paying Living Expenses: Not on file  Food Insecurity:   . Worried About Charity fundraiser in the Last Year: Not on file  . Ran Out of Food in the Last Year: Not on file  Transportation Needs:   . Lack of Transportation (Medical): Not on file  . Lack of Transportation (Non-Medical): Not on file  Physical Activity:   . Days of Exercise per Week: Not on file  . Minutes of Exercise per Session: Not on file  Stress:   . Feeling of Stress : Not on file  Social Connections:   . Frequency of Communication with Friends and Family: Not on file  . Frequency of Social Gatherings with Friends and Family: Not on file  . Attends Religious Services: Not on file  . Active Member of Clubs or Organizations: Not on file  . Attends Archivist Meetings: Not on file  . Marital Status: Not on file  Intimate Partner Violence:   . Fear of Current or Ex-Partner: Not on file  . Emotionally Abused: Not on file  . Physically Abused: Not on file  . Sexually Abused: Not on file    Past Surgical Hx:  Past Surgical  History:  Procedure Laterality Date  . BREAST LUMPECTOMY WITH RADIOACTIVE SEED AND SENTINEL LYMPH NODE BIOPSY Left 06/09/2017   Procedure: BREAST LUMPECTOMY WITH RADIOACTIVE SEED AND SENTINEL LYMPH NODE BIOPSY;  Surgeon: Excell Seltzer, MD;  Location: [facility];  Service: General;  Laterality: Left;  . COLONOSCOPY  11/02/2017   polyps  . HERNIA REPAIR    . HIATAL HERNIA REPAIR  09/2019  . LAPAROSCOPIC ASSISTED VAGINAL HYSTERECTOMY N/A  11/04/2017   Procedure: LAPAROSCOPIC ASSISTED VAGINAL HYSTERECTOMY;  Surgeon: [name], MD;  Location: [facility];  Service: Gynecology;  Laterality: N/A;  . LAPAROSCOPIC BILATERAL SALPINGO OOPHERECTOMY Bilateral 11/04/2017   Procedure: LAPAROSCOPIC BILATERAL SALPINGO OOPHORECTOMY;  Surgeon: [name], MD;  Location: [facility];  Service: Gynecology;  Laterality: Bilateral;  . LAPAROSCOPY N/A 10/09/2019   Procedure: LAPAROSCOPY DIAGNOSTIC WITH PERITONEAL BIOPSIES;  Surgeon: [name], MD;  [facility];  Service: Gynecology;  Laterality: N/A;  . PORTACATH PLACEMENT Right 06/09/2017   Procedure: INSERTION PORT-A-CATH WITH [place];  Surgeon: Excell Seltzer, MD;  Location: [facility];  Service: General;  Laterality: Right;  . TUBAL LIGATION  02/02/2002   @[facility]    [facility]  . UPPER GI ENDOSCOPY      Past Medical Hx:  Past Medical History:  Diagnosis Date  . Arthritis    knees, elbows  . Borderline glaucoma of right eye   . BRCA2 gene mutation positive 02/17/2018   BRCA2 c.4552del (p.[id number]*25)  Result reported out on 02/15/2018.   . Breast cancer ([place]) 05/2017   left  . Chronic back pain   . Deaf    per pt born hearing and at age 59 1/2 lost hearing , was told by mother unknown cause, can miminally hear in left and no hearing on right   . Depression   . Dyspnea    states some SOB with ADLs and anemia   . Elevated cancer antigen 125 (CA 125)   . Family history of breast cancer   . Family history of  breast cancer   . Family history of colon cancer   . Genetic testing 02/17/2018   The Common Hereditary Cancer Panel offered by [facility] includes sequencing and/or deletion duplication testing of the following 47 genes: APC, ATM, AXIN2, BARD1, BMPR1A, BRCA1, BRCA2, BRIP1, CDH1, CDKN2A (p14ARF), CDKN2A (p16INK4a), CKD4, CHEK2, CTNNA1, DICER1, EPCAM (Deletion/duplication testing only), GREM1 (promoter region deletion/duplication testing only), KIT, MEN1, MLH1, MSH2, MSH3, MSH6, MU  . History of cancer chemotherapy    left breast cancer 09-08-2017 to 10-09-2017;  fallopion tube cancer  12-15-2017  to 06-28-2018  . 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 History of external beam radiation therapy    left breast  11-25-2017  to 01-07-2018  . Hyperlipidemia   . Hypertension    followed by pcp   (10-05-2019  per pt never had stress test)  . IDA (iron deficiency anemia)   . Malignant neoplasm of lower-outer quadrant of left breast of female, estrogen receptor negative [facility]) oncologist-- dr [name]   dx 08/ 2018--- Stage IA, DCIS,  ER/ PR negative,  HER-2 positive;  06-09-2017 s/p left breast lumpectomy with node dissection;  completed chemo 10-09-2017  and radiation 01-07-2018/  hercepton completed 06-28-2018  . Non-insulin dependent type 2 diabetes mellitus ([place])    followed by pcp   (10-05-2019 per pt check cbg every other day in AM,  fasting cbg-- 105)  . Numbness of right thumb   . Wears glasses     Past Gynecological History:  SVDx 2, s/p hysterectomy Patient's last menstrual period was 07/09/2017 (lmp unknown).  Family Hx:  Family History  Problem Relation Age of Onset  . Hypertension Mother   . Diabetes Father   . Hypertension Sister   .  Diabetes Paternal Grandmother   . Colon cancer Paternal Grandmother 77  . CAD  Brother   . Diabetes Brother   . Breast cancer Maternal Aunt        dx >50  . Heart attack Paternal Grandfather   . Breast cancer Maternal Aunt        dx under 82  . Breast cancer Maternal Aunt        dx  under 50    Review of Systems:  Constitutional  Feels well,    ENT Normal appearing ears and nares bilaterally Skin/Breast  No rash, sores, jaundice, itching, dryness Cardiovascular  No chest pain, shortness of breath, or edema  Pulmonary  No cough or wheeze.  Gastro Intestinal  No nausea, vomitting, or diarrhoea. No bright red blood per rectum, no abdominal pain, change in bowel movement, or constipation.  Genito Urinary  No frequency, urgency, dysuria,  Musculo Skeletal  No myalgia, arthralgia, joint swelling or pain  Neurologic  No weakness, numbness, change in gait,  Psychology  No depression, anxiety, insomnia.   Vitals:  Blood pressure 118/63, pulse (!) 112, temperature 98.7 F (37.1 C), resp. rate 18, weight 159 lb 8 oz (72.3 kg), last menstrual period 07/09/2017, SpO2 100 %.  Physical Exam: WD in NAD Neck  Supple NROM, without any enlargements.  Lymph Node Survey No cervical supraclavicular or inguinal adenopathy Cardiovascular  Pulse normal rate, regularity and rhythm. S1 and S2 normal.  Lungs  Clear to auscultation bilateraly, without wheezes/crackles/rhonchi. Good air movement.  Skin  No rash/lesions/breakdown  Psychiatry  Alert and oriented to person, place, and time  Abdomen  Normoactive bowel sounds, abdomen soft, non-tender and obese without evidence of hernia. Soft well healed incisions. No masses. Back No CVA tenderness Genito Urinary  No palpable masses in vagina or pelvis. Rectal  No palpable masses Extremities  No bilateral cyanosis, clubbing or edema.   30 minutes of direct face to face counseling time was spent with the patient. This included discussion about prognosis, therapy recommendations and postoperative side effects and are  beyond the scope of routine postoperative care.  Thereasa Solo, MD  11/03/2019, 1:08 PM

## 2019-11-03 NOTE — Patient Instructions (Signed)
You are cleared for all activity after your surgery.  It appears that the breast cancer has returned in multiple spots in the bones and in the neck lymph nodes. Dr [name] is ordering a biopsy to confirm the type of cancer is the same as the original breast cancer. If so, he will order chemotherapy medications to treat it.  You do not need to have checkups with Dr Denman George while you are being treated for your breast cancer as you will be having regular doctor's visits with Dr [name] and because you will be having regular scans.   However, if you go back into remission from your breast cancer you will resume check-ups with Dr Denman George.

## 2019-11-06 NOTE — Progress Notes (Signed)
Patient Care Team: Antony Blackbird, MD as PCP - General (Family Medicine) Excell Seltzer, MD (Inactive) as Consulting Physician (General Surgery) [name], MD as Consulting Physician (Hematology and Oncology) Kyung Rudd, MD as Consulting Physician (Radiation Oncology) [name], MD as Consulting Physician (Gynecologic Oncology) Delice Bison Charlestine Massed, NP as Nurse Practitioner (Hematology and Oncology)  DIAGNOSIS:    ICD-10-CM   1. 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 Malignant neoplasm of lower-outer quadrant of left breast of female, estrogen receptor negative ([place])  C50.512    Z17.1     SUMMARY OF ONCOLOGIC HISTORY: Oncology History  Malignant neoplasm of lower-outer quadrant of left breast of female, estrogen receptor negative ([place])  05/11/2017 Initial Diagnosis   Left breast biopsy 3:30 position 6 cm from nipple: IDC with DCIS, lymphovascular invasion present, grade 2-3, ER 0%, PR 0%, HER-2 positive ratio 2.28, Ki-67 20%, 1 cm lesion left breast, T1b N0 stage IA clinical stage   06/09/2017 Surgery   Left lumpectomy: IDC grade 3, 1.2 cm, DCIS is present, margins negative, 0/6 lymph nodes negative, ER 0%, PR 0%, HER-2 positive ratio 2.28, Ki-67 20%, T1 BN 0 stage IA   07/09/2017 - 09/29/2017 Adjuvant Chemotherapy   Taxol/Herceptin x 12 completed on 09/29/2017, went on to receive every 3 week adjuvant Herceptin through 06/28/2018   11/25/2017 - 01/07/2018 Radiation Therapy    The patient initially received a dose of 50.4 Gy in 28 fractions to the breast using whole-breast tangent fields. This was delivered using a 3-D conformal technique. The patient then received a boost to the seroma. This delivered an additional 10 Gy in 5 fractions using a 3-D technique. The total dose was 60.4 Gy.   02/15/2018 Genetic Testing   The Common Hereditary Cancer Panel offered by [facility] includes sequencing and/or deletion duplication testing of the following 47 genes: APC,  ATM, AXIN2, BARD1, BMPR1A, BRCA1, BRCA2, BRIP1, CDH1, CDKN2A (p14ARF), CDKN2A (p16INK4a), CKD4, CHEK2, CTNNA1, DICER1, EPCAM (Deletion/duplication testing only), GREM1 (promoter region deletion/duplication testing only), KIT, MEN1, MLH1, MSH2, MSH3, MSH6, MUTYH, NBN, NF1, NHTL1, PALB2, PDGFRA, PMS2, POLD1, POLE, PTEN, RAD50, RAD51C, RAD51D, SDHB, SDHC, SDHD, SMAD4, SMARCA4. STK11, TP53, TSC1, TSC2, and VHL.  The following genes were evaluated for sequence changes only: SDHA and HOXB13 c.251G>A variant only.   Results: POSITIVE. 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 The date of this test report is 02/15/2018   10/20/2019 PET scan   Bone mets involving Right hip, Bil Iliac bones, T8, T9, L2 and L3, Rt prox humerus, Righ glenoid. Some lytic and some sclerotic. Mild Left supraclav adenopathy, 2 small lesions basal ganglia (could be lacunar infarcts)   Fallopian tube cancer, carcinoma, right ([place])  11/04/2017 Surgery   Uterus, cervix and bilateral fallopian tubes: Fallopian tube with serous carcinoma 0.6 cm positive for p53, PAX8, WT-1, MOC-31, cytokeratin 7, and estrogen receptor   01/11/2018 -  Chemotherapy   Taxol and carboplatin every 3 weeks (Taxol discontinued with cycle 2 because of profound rash)   10/20/2019 PET scan   Bone mets involving Right hip, Bil Iliac bones, T8, T9, L2 and L3, Rt prox humerus, Righ glenoid. Some lytic and some sclerotic. Mild Left supraclav adenopathy, 2 small lesions basal ganglia (could be lacunar infarcts)     CHIEF COMPLIANT: F/U to discuss treatment options   INTERVAL HISTORY: Erin [name] is a 59 y.o. with above-mentioned history of history of metastatic breast cancer with bone metastases and fallopian tube cancer. Left supraclavicular lymph  node biopsy on 10/31/19 showed metastatic breast carcinoma, ER/PR negative, HER-2 equivocal by FISH. Brain MRI on 11/02/19 showed no evidence of metastatic disease. She presents to the clinic today  to discuss treatment options.   ALLERGIES:  has No Known Allergies.  MEDICATIONS:  Current Outpatient Medications  Medication Sig Dispense Refill  . Accu-Chek FastClix Lancets MISC Use as instructed. Inject into the skin twice daily 100 each 3  . atorvastatin (LIPITOR) 40 MG tablet Take 1 tablet (40 mg total) by mouth daily. 90 tablet 1  . Blood Glucose Calibration (ACCU-CHEK GUIDE CONTROL) LIQD 1 each by In Vitro route once as needed for up to 1 dose. 1 each 0  . cholecalciferol (VITAMIN D) 1000 units tablet Take 1,000 Units by mouth daily.    . ferrous sulfate 325 (65 FE) MG tablet Take 1 tablet (325 mg total) by mouth daily before breakfast. (Patient taking differently: Take 325 mg by mouth daily before breakfast. ) 90 tablet 1  . glimepiride (AMARYL) 2 MG tablet Take 1 tablet (2 mg total) by mouth daily with breakfast. To lower blood sugar (Patient taking differently: Take 2 mg by mouth daily with breakfast. To lower blood sugar) 90 tablet 3  . glucose blood (ACCU-CHEK GUIDE) test strip Use as instructed. Check blood glucose by fingerstick twice per day. 100 each 12  . Lancet Devices (ACCU-CHEK SOFTCLIX) lancets Use as instructed 1 each 0  . lisinopril (ZESTRIL) 10 MG tablet Take 1 tablet (10 mg total) by mouth daily. To lower blood pressure (Patient taking differently: Take 10 mg by mouth daily. To lower blood pressure) 90 tablet 3  . metFORMIN (GLUCOPHAGE-XR) 500 MG 24 hr tablet TAKE 2 TABLETS BY MOUTH EVERY DAY WITH BREAKFAST (Patient taking differently: Take 1,000 mg by mouth at bedtime. ) 180 tablet 0  . methocarbamol (ROBAXIN) 500 MG tablet Take one pill every 8 hours during the day and two pills at bedtime as needed for muscle spasms. (Patient taking differently: Take 500 mg by mouth 2 (two) times daily. 10-05-2019 per pt takes one in morning and two at bedtime Take one pill every 8 hours during the day and two pills at bedtime as needed for muscle spasms.) 90 tablet 0   No current  facility-administered medications for this visit.    PHYSICAL EXAMINATION: ECOG PERFORMANCE STATUS: 1 - Symptomatic but completely ambulatory  Vitals:   11/07/19 1331  BP: 129/72  Pulse: (!) 108  Resp: 18  Temp: 99.1 F (37.3 C)  SpO2: 100%   Filed Weights   11/07/19 1331  Weight: 159 lb 1.6 oz (72.2 kg)    LABORATORY DATA:  I have reviewed the data as listed CMP Latest Ref Rng & Units 10/06/2019 09/06/2019 06/13/2019  Glucose 70 - 99 mg/dL 101(H) 97 90  BUN 6 - 20 mg/dL 8 14 6   Creatinine 0.44 - 1.00 mg/dL 0.70 0.77 0.73  Sodium 135 - 145 mmol/L 139 141 142  Potassium 3.5 - 5.1 mmol/L 4.2 4.1 4.0  Chloride 98 - 111 mmol/L 104 105 107  CO2 22 - 32 mmol/L 27 25 25   Calcium 8.9 - 10.3 mg/dL 9.2 9.2 9.4  Total Protein 6.5 - 8.1 g/dL - 7.5 -  Total Bilirubin 0.3 - 1.2 mg/dL - 0.2(L) -  Alkaline Phos 38 - 126 U/L - 96 -  AST 15 - 41 U/L - 18 -  ALT 0 - 44 U/L - 22 -    Lab Results  Component Value Date  WBC 8.8 10/31/2019   HGB 12.4 10/31/2019   HCT 38.4 10/31/2019   MCV 94.8 10/31/2019   PLT 298 10/31/2019   NEUTROABS 5.6 10/31/2019    ASSESSMENT & PLAN:  Fallopian tube cancer, carcinoma, right (HCC)    Malignant neoplasm of lower-outer quadrant of left breast of female, estrogen receptor negative (HCC) Left lumpectomy: IDC grade 3, 1.2 cm, DCIS is present, margins negative, 0/6 lymph nodes negative, ER 0%, PR 0%, HER-2 positive ratio 2.28, Ki-67 20%, T1 BN 0 stage IA S/P Taxol-Herceptin BRCA 2 Mutation Positive  10/20/19: Bone mets involving Right hip, Bil Iliac bones, T8, T9, L2 and L3, Rt prox humerus, Righ glenoid. Some lytic and some sclerotic. Mild Left supraclav adenopathy, 2 small lesions basal ganglia (could be lacunar infarcts)  Radiology review: I discussed the scans and showed the images. Plan: Biopsy the supra clav LN: 10/31/2019: Metastatic breast cancer, ER/PR negative HER-2 equivocal by IHC, FISH positive  Treatment plan: Kadcyla every 3-week  treatment. I discussed the mechanism of action of Kadcyla as well as risks and benefits of the treatment. We will initiate therapy after port has been placed.  Echocardiogram will also be obtained. Plan to scan her after 3 months of therapy.    No orders of the defined types were placed in this encounter.  The patient has a good understanding of the overall plan. she agrees with it. she will call with any problems that may develop before the next visit here.  Total time spent: 30 mins including face to face time and time spent for planning, charting and coordination of care  [name], MD 11/07/2019  I, Cloyde Reams Dorshimer, am acting as scribe for [name].  I have reviewed the above documentation for accuracy and completeness, and I agree with the above.

## 2019-11-07 ENCOUNTER — Other Ambulatory Visit: Payer: Self-pay

## 2019-11-07 ENCOUNTER — Other Ambulatory Visit: Payer: Self-pay | Admitting: *Deleted

## 2019-11-07 ENCOUNTER — Other Ambulatory Visit: Payer: Self-pay | Admitting: General Surgery

## 2019-11-07 ENCOUNTER — Encounter: Payer: Self-pay | Admitting: *Deleted

## 2019-11-07 ENCOUNTER — Inpatient Hospital Stay (HOSPITAL_BASED_OUTPATIENT_CLINIC_OR_DEPARTMENT_OTHER): Payer: Medicaid Other | Admitting: Hematology and Oncology

## 2019-11-07 VITALS — BP 129/72 | HR 108 | Temp 99.1°F | Resp 18 | Ht 61.0 in | Wt 159.1 lb

## 2019-11-07 DIAGNOSIS — Z7189 Other specified counseling: Secondary | ICD-10-CM | POA: Diagnosis not present

## 2019-11-07 DIAGNOSIS — C50512 Malignant neoplasm of lower-outer quadrant of left female breast: Secondary | ICD-10-CM | POA: Diagnosis not present

## 2019-11-07 DIAGNOSIS — C5701 Malignant neoplasm of right fallopian tube: Secondary | ICD-10-CM

## 2019-11-07 DIAGNOSIS — Z171 Estrogen receptor negative status [ER-]: Secondary | ICD-10-CM

## 2019-11-07 LAB — SURGICAL PATHOLOGY

## 2019-11-07 MED ORDER — LIDOCAINE-PRILOCAINE 2.5-2.5 % EX CREA: TOPICAL_CREAM | CUTANEOUS | 3 refills | Status: DC

## 2019-11-07 NOTE — Progress Notes (Signed)
DISCONTINUE ON PATHWAY REGIMEN - Breast   Paclitaxel Weekly + Trastuzumab Weekly:   Administer weekly:     Paclitaxel      Trastuzumab      Trastuzumab   **Always confirm dose/schedule in your pharmacy ordering system**  Trastuzumab (Maintenance - NO Loading Dose):   A cycle is every 21 days:     Trastuzumab   **Always confirm dose/schedule in your pharmacy ordering system**  REASON: Disease Progression PRIOR TREATMENT: [id number]: Weekly Paclitaxel + Trastuzumab x 12 Weeks, Followed by Trastuzumab Maintenance q21 Days x 13 Cycles TREATMENT RESPONSE: Progressive Disease (PD)  START ON PATHWAY REGIMEN - Breast     A cycle is every 21 days:     Ado-trastuzumab emtansine   **Always confirm dose/schedule in your pharmacy ordering system**  Patient Characteristics: Distant Metastases or Locoregional Recurrent Disease - Unresected or Locally Advanced Unresectable Disease Progressing after Neoadjuvant and Local Therapies, HER2 Positive, ER Negative/Unknown, Chemotherapy, Second Line Therapeutic Status: Distant Metastases BRCA Mutation Status: Present ([place]) ER Status: Negative (-) HER2 Status: Positive (+) PR Status: Negative (-) Line of Therapy: Second Line Intent of Therapy: Curative Intent, Discussed with Patient

## 2019-11-07 NOTE — Assessment & Plan Note (Signed)
Left lumpectomy: IDC grade 3, 1.2 cm, DCIS is present, margins negative, 0/6 lymph nodes negative, ER 0%, PR 0%, HER-2 positive ratio 2.28, Ki-67 20%, T1 BN 0 stage IA S/P Taxol-Herceptin BRCA 2 Mutation Positive  10/20/19: Bone mets involving Right hip, Bil Iliac bones, T8, T9, L2 and L3, Rt prox humerus, Righ glenoid. Some lytic and some sclerotic. Mild Left supraclav adenopathy, 2 small lesions basal ganglia (could be lacunar infarcts)  Radiology review: I discussed the scans and showed the images. Plan: Biopsy the supra clav LN: 10/31/2019: Metastatic breast cancer, ER/PR negative HER-2 equivocal by IHC, FISH positive  Treatment plan: Kadcyla every 3-week treatment. I discussed the mechanism of action of Kadcyla as well as risks and benefits of the treatment. We will initiate therapy after port has been placed. Echocardiogram will also be obtained. Plan to scan her after 3 months of therapy.

## 2019-11-07 NOTE — Progress Notes (Signed)
Per MD request, RN successfully faxed documents to [facility] for Tumor profiling.

## 2019-11-08 ENCOUNTER — Other Ambulatory Visit: Payer: Self-pay

## 2019-11-08 ENCOUNTER — Encounter (HOSPITAL_BASED_OUTPATIENT_CLINIC_OR_DEPARTMENT_OTHER): Payer: Self-pay | Admitting: General Surgery

## 2019-11-08 ENCOUNTER — Encounter: Payer: Self-pay | Admitting: *Deleted

## 2019-11-09 ENCOUNTER — Encounter: Payer: Self-pay | Admitting: *Deleted

## 2019-11-09 ENCOUNTER — Other Ambulatory Visit (HOSPITAL_COMMUNITY)
Admission: RE | Admit: 2019-11-09 | Discharge: 2019-11-09 | Disposition: A | Discharge status: Home / Self Care | Payer: Medicaid Other | Source: Ambulatory Visit | Attending: General Surgery | Admitting: General Surgery

## 2019-11-09 ENCOUNTER — Telehealth: Payer: Self-pay | Admitting: Hematology and Oncology

## 2019-11-09 DIAGNOSIS — Z01812 Encounter for preprocedural laboratory examination: Secondary | ICD-10-CM | POA: Insufficient documentation

## 2019-11-09 DIAGNOSIS — Z20822 Contact with and (suspected) exposure to covid-19: Secondary | ICD-10-CM | POA: Insufficient documentation

## 2019-11-09 LAB — SARS CORONAVIRUS 2 (TAT 6-24 HRS): SARS Coronavirus 2: NEGATIVE

## 2019-11-09 NOTE — Telephone Encounter (Signed)
Scheduled appt per 2/24 sch message. Pt aware of appt but a little confused about them - sent message to RN to give her a call .

## 2019-11-10 ENCOUNTER — Other Ambulatory Visit: Payer: Self-pay

## 2019-11-10 ENCOUNTER — Encounter (HOSPITAL_BASED_OUTPATIENT_CLINIC_OR_DEPARTMENT_OTHER)
Admission: RE | Admit: 2019-11-10 | Discharge: 2019-11-10 | Disposition: A | Discharge status: Home / Self Care | Payer: Medicaid Other | Source: Ambulatory Visit | Attending: General Surgery | Admitting: General Surgery

## 2019-11-10 DIAGNOSIS — Z8544 Personal history of malignant neoplasm of other female genital organs: Secondary | ICD-10-CM | POA: Diagnosis not present

## 2019-11-10 DIAGNOSIS — Z90722 Acquired absence of ovaries, bilateral: Secondary | ICD-10-CM | POA: Diagnosis not present

## 2019-11-10 DIAGNOSIS — H9193 Unspecified hearing loss, bilateral: Secondary | ICD-10-CM | POA: Diagnosis not present

## 2019-11-10 DIAGNOSIS — I10 Essential (primary) hypertension: Secondary | ICD-10-CM | POA: Diagnosis not present

## 2019-11-10 DIAGNOSIS — Z7984 Long term (current) use of oral hypoglycemic drugs: Secondary | ICD-10-CM | POA: Diagnosis not present

## 2019-11-10 DIAGNOSIS — Z9221 Personal history of antineoplastic chemotherapy: Secondary | ICD-10-CM | POA: Diagnosis not present

## 2019-11-10 DIAGNOSIS — Z9079 Acquired absence of other genital organ(s): Secondary | ICD-10-CM | POA: Diagnosis not present

## 2019-11-10 DIAGNOSIS — E785 Hyperlipidemia, unspecified: Secondary | ICD-10-CM | POA: Diagnosis not present

## 2019-11-10 DIAGNOSIS — D649 Anemia, unspecified: Secondary | ICD-10-CM | POA: Diagnosis not present

## 2019-11-10 DIAGNOSIS — Z1501 Genetic susceptibility to malignant neoplasm of breast: Secondary | ICD-10-CM | POA: Diagnosis not present

## 2019-11-10 DIAGNOSIS — Z803 Family history of malignant neoplasm of breast: Secondary | ICD-10-CM | POA: Diagnosis not present

## 2019-11-10 DIAGNOSIS — C50919 Malignant neoplasm of unspecified site of unspecified female breast: Secondary | ICD-10-CM | POA: Diagnosis not present

## 2019-11-10 DIAGNOSIS — E119 Type 2 diabetes mellitus without complications: Secondary | ICD-10-CM | POA: Diagnosis not present

## 2019-11-10 DIAGNOSIS — Z800 Family history of malignant neoplasm of digestive organs: Secondary | ICD-10-CM | POA: Diagnosis not present

## 2019-11-10 DIAGNOSIS — Z923 Personal history of irradiation: Secondary | ICD-10-CM | POA: Diagnosis not present

## 2019-11-10 DIAGNOSIS — Z90710 Acquired absence of both cervix and uterus: Secondary | ICD-10-CM | POA: Diagnosis not present

## 2019-11-10 DIAGNOSIS — Z79899 Other long term (current) drug therapy: Secondary | ICD-10-CM | POA: Diagnosis not present

## 2019-11-10 LAB — BASIC METABOLIC PANEL WITH GFR
Anion gap: 10 (ref 5–15)
BUN: 8 mg/dL (ref 6–20)
CO2: 24 mmol/L (ref 22–32)
Calcium: 9.3 mg/dL (ref 8.9–10.3)
Chloride: 106 mmol/L (ref 98–111)
Creatinine, Ser: 0.55 mg/dL (ref 0.44–1.00)
GFR calc Af Amer: >60 mL/min
GFR calc non Af Amer: >60 mL/min
Glucose, Bld: 73 mg/dL (ref 70–99)
Potassium: 4.4 mmol/L (ref 3.5–5.1)
Sodium: 140 mmol/L (ref 135–145)

## 2019-11-10 MED ORDER — ENSURE PRE-SURGERY PO LIQD: 296.0000 mL | Freq: Once | ORAL | Status: DC

## 2019-11-10 NOTE — Progress Notes (Signed)

## 2019-11-12 NOTE — Anesthesia Preprocedure Evaluation (Addendum)
Anesthesia Evaluation  Patient identified by MRN, date of birth, ID band Patient awake    Reviewed: Allergy & Precautions, NPO status , Patient's Chart, lab work & pertinent test results  Airway Mallampati: II  TM Distance: >3 FB Neck ROM: Full    Dental no notable dental hx. (+) Teeth Intact, Dental Advisory Given   Pulmonary neg pulmonary ROS,    Pulmonary exam normal breath sounds clear to auscultation       Cardiovascular hypertension, Pt. on medications Normal cardiovascular exam Rhythm:Regular Rate:Normal     Neuro/Psych negative neurological ROS  negative psych ROS   GI/Hepatic negative GI ROS, Neg liver ROS,   Endo/Other  diabetes, Type 2  Renal/GU negative Renal ROS  negative genitourinary   Musculoskeletal   Abdominal   Peds  Hematology  (+) anemia ,   Anesthesia Other Findings   Reproductive/Obstetrics                            Anesthesia Physical Anesthesia Plan  ASA: III  Anesthesia Plan: General   Post-op Pain Management:    Induction: Intravenous  PONV Risk Score and Plan: 3 and Treatment may vary due to age or medical condition, Ondansetron, Midazolam and Dexamethasone  Airway Management Planned: LMA  Additional Equipment: None  Intra-op Plan:   Post-operative Plan:   Informed Consent: I have reviewed the patients History and Physical, chart, labs and discussed the procedure including the risks, benefits and alternatives for the proposed anesthesia with the patient or authorized representative who has indicated his/her understanding and acceptance.     Dental advisory given  Plan Discussed with:   Anesthesia Plan Comments: (Pt deaf w sign language interpreter)       Anesthesia Quick Evaluation

## 2019-11-13 ENCOUNTER — Ambulatory Visit (HOSPITAL_COMMUNITY): Payer: Medicaid Other

## 2019-11-13 ENCOUNTER — Ambulatory Visit (HOSPITAL_BASED_OUTPATIENT_CLINIC_OR_DEPARTMENT_OTHER): Payer: Medicaid Other | Admitting: Anesthesiology

## 2019-11-13 ENCOUNTER — Ambulatory Visit (HOSPITAL_BASED_OUTPATIENT_CLINIC_OR_DEPARTMENT_OTHER)
Admission: RE | Admit: 2019-11-13 | Discharge: 2019-11-13 | Disposition: A | Discharge status: Home / Self Care | Payer: Medicaid Other | Attending: General Surgery | Admitting: General Surgery

## 2019-11-13 ENCOUNTER — Other Ambulatory Visit: Payer: Self-pay

## 2019-11-13 ENCOUNTER — Encounter (HOSPITAL_BASED_OUTPATIENT_CLINIC_OR_DEPARTMENT_OTHER)
Admission: RE | Disposition: A | Discharge status: Home / Self Care | Payer: Self-pay | Source: Home / Self Care | Attending: General Surgery

## 2019-11-13 ENCOUNTER — Encounter (HOSPITAL_BASED_OUTPATIENT_CLINIC_OR_DEPARTMENT_OTHER): Payer: Self-pay | Admitting: General Surgery

## 2019-11-13 DIAGNOSIS — I10 Essential (primary) hypertension: Secondary | ICD-10-CM | POA: Diagnosis not present

## 2019-11-13 DIAGNOSIS — D649 Anemia, unspecified: Secondary | ICD-10-CM | POA: Insufficient documentation

## 2019-11-13 DIAGNOSIS — Z90710 Acquired absence of both cervix and uterus: Secondary | ICD-10-CM | POA: Insufficient documentation

## 2019-11-13 DIAGNOSIS — Z9221 Personal history of antineoplastic chemotherapy: Secondary | ICD-10-CM | POA: Insufficient documentation

## 2019-11-13 DIAGNOSIS — Z8544 Personal history of malignant neoplasm of other female genital organs: Secondary | ICD-10-CM | POA: Diagnosis not present

## 2019-11-13 DIAGNOSIS — C50919 Malignant neoplasm of unspecified site of unspecified female breast: Secondary | ICD-10-CM | POA: Diagnosis not present

## 2019-11-13 DIAGNOSIS — Z79899 Other long term (current) drug therapy: Secondary | ICD-10-CM | POA: Insufficient documentation

## 2019-11-13 DIAGNOSIS — Z90722 Acquired absence of ovaries, bilateral: Secondary | ICD-10-CM | POA: Insufficient documentation

## 2019-11-13 DIAGNOSIS — Z95828 Presence of other vascular implants and grafts: Secondary | ICD-10-CM

## 2019-11-13 DIAGNOSIS — Z1501 Genetic susceptibility to malignant neoplasm of breast: Secondary | ICD-10-CM | POA: Insufficient documentation

## 2019-11-13 DIAGNOSIS — Z9079 Acquired absence of other genital organ(s): Secondary | ICD-10-CM | POA: Insufficient documentation

## 2019-11-13 DIAGNOSIS — Z803 Family history of malignant neoplasm of breast: Secondary | ICD-10-CM | POA: Insufficient documentation

## 2019-11-13 DIAGNOSIS — C50512 Malignant neoplasm of lower-outer quadrant of left female breast: Secondary | ICD-10-CM | POA: Diagnosis not present

## 2019-11-13 DIAGNOSIS — H9193 Unspecified hearing loss, bilateral: Secondary | ICD-10-CM | POA: Diagnosis not present

## 2019-11-13 DIAGNOSIS — C50912 Malignant neoplasm of unspecified site of left female breast: Secondary | ICD-10-CM | POA: Diagnosis not present

## 2019-11-13 DIAGNOSIS — E785 Hyperlipidemia, unspecified: Secondary | ICD-10-CM | POA: Insufficient documentation

## 2019-11-13 DIAGNOSIS — E119 Type 2 diabetes mellitus without complications: Secondary | ICD-10-CM | POA: Insufficient documentation

## 2019-11-13 DIAGNOSIS — Z7984 Long term (current) use of oral hypoglycemic drugs: Secondary | ICD-10-CM | POA: Insufficient documentation

## 2019-11-13 DIAGNOSIS — Z452 Encounter for adjustment and management of vascular access device: Secondary | ICD-10-CM | POA: Diagnosis not present

## 2019-11-13 DIAGNOSIS — Z171 Estrogen receptor negative status [ER-]: Secondary | ICD-10-CM | POA: Diagnosis not present

## 2019-11-13 DIAGNOSIS — J9811 Atelectasis: Secondary | ICD-10-CM | POA: Diagnosis not present

## 2019-11-13 DIAGNOSIS — Z923 Personal history of irradiation: Secondary | ICD-10-CM | POA: Insufficient documentation

## 2019-11-13 DIAGNOSIS — Z800 Family history of malignant neoplasm of digestive organs: Secondary | ICD-10-CM | POA: Insufficient documentation

## 2019-11-13 HISTORY — PX: PORTACATH PLACEMENT: SHX2246

## 2019-11-13 LAB — GLUCOSE, CAPILLARY
Glucose-Capillary: 96 mg/dL (ref 70–99)
Glucose-Capillary: 119 mg/dL — ABNORMAL HIGH (ref 70–99)

## 2019-11-13 SURGERY — INSERTION, TUNNELED CENTRAL VENOUS DEVICE, WITH PORT
Anesthesia: General | Site: Chest | Laterality: Right

## 2019-11-13 MED ORDER — MIDAZOLAM HCL 2 MG/2ML IJ SOLN: 1.0000 mg | INTRAMUSCULAR | Status: DC | PRN

## 2019-11-13 MED ORDER — ONDANSETRON HCL 4 MG/2ML IJ SOLN
INTRAMUSCULAR | Status: AC
  Filled 2019-11-13: qty 2

## 2019-11-13 MED ORDER — LIDOCAINE 2% (20 MG/ML) 5 ML SYRINGE
INTRAMUSCULAR | Status: AC
  Filled 2019-11-13: qty 5

## 2019-11-13 MED ORDER — PROPOFOL 10 MG/ML IV BOLUS
INTRAVENOUS | Status: DC | PRN
  Administered 2019-11-13: 150 mg via INTRAVENOUS

## 2019-11-13 MED ORDER — PHENYLEPHRINE HCL (PRESSORS) 10 MG/ML IV SOLN
INTRAVENOUS | Status: DC | PRN
  Administered 2019-11-13 (×2): 80 ug via INTRAVENOUS

## 2019-11-13 MED ORDER — MIDAZOLAM HCL 2 MG/2ML IJ SOLN
INTRAMUSCULAR | Status: AC
  Filled 2019-11-13: qty 2

## 2019-11-13 MED ORDER — HEPARIN SOD (PORK) LOCK FLUSH 100 UNIT/ML IV SOLN
INTRAVENOUS | Status: AC
  Filled 2019-11-13: qty 10

## 2019-11-13 MED ORDER — HEPARIN SOD (PORK) LOCK FLUSH 100 UNIT/ML IV SOLN
INTRAVENOUS | Status: DC | PRN
  Administered 2019-11-13: 500 [IU] via INTRAVENOUS

## 2019-11-13 MED ORDER — HEPARIN (PORCINE) IN NACL 2-0.9 UNITS/ML
INTRAMUSCULAR | Status: AC | PRN
  Administered 2019-11-13: 1

## 2019-11-13 MED ORDER — MIDAZOLAM HCL 5 MG/5ML IJ SOLN
INTRAMUSCULAR | Status: DC | PRN
  Administered 2019-11-13: 2 mg via INTRAVENOUS

## 2019-11-13 MED ORDER — ACETAMINOPHEN 500 MG PO TABS
1000.0000 mg | ORAL_TABLET | ORAL | Status: AC
  Administered 2019-11-13: 07:00:00 1000 mg via ORAL

## 2019-11-13 MED ORDER — DEXAMETHASONE SODIUM PHOSPHATE 10 MG/ML IJ SOLN
INTRAMUSCULAR | Status: AC
  Filled 2019-11-13: qty 1

## 2019-11-13 MED ORDER — ONDANSETRON HCL 4 MG/2ML IJ SOLN
INTRAMUSCULAR | Status: DC | PRN
  Administered 2019-11-13: 4 mg via INTRAVENOUS

## 2019-11-13 MED ORDER — FENTANYL CITRATE (PF) 100 MCG/2ML IJ SOLN
INTRAMUSCULAR | Status: AC
  Filled 2019-11-13: qty 2

## 2019-11-13 MED ORDER — OXYCODONE HCL 5 MG PO TABS: 5.0000 mg | ORAL_TABLET | Freq: Four times a day (QID) | ORAL | 0 refills | Status: DC | PRN

## 2019-11-13 MED ORDER — CEFAZOLIN SODIUM-DEXTROSE 2-4 GM/100ML-% IV SOLN
2.0000 g | INTRAVENOUS | Status: AC
  Administered 2019-11-13: 2 g via INTRAVENOUS

## 2019-11-13 MED ORDER — LACTATED RINGERS IV SOLN: INTRAVENOUS | Status: DC

## 2019-11-13 MED ORDER — ACETAMINOPHEN 500 MG PO TABS
ORAL_TABLET | ORAL | Status: AC
  Filled 2019-11-13: qty 2

## 2019-11-13 MED ORDER — FENTANYL CITRATE (PF) 100 MCG/2ML IJ SOLN: 50.0000 ug | INTRAMUSCULAR | Status: DC | PRN

## 2019-11-13 MED ORDER — PROPOFOL 10 MG/ML IV BOLUS
INTRAVENOUS | Status: AC
  Filled 2019-11-13: qty 20

## 2019-11-13 MED ORDER — HEPARIN (PORCINE) IN NACL 1000-0.9 UT/500ML-% IV SOLN
INTRAVENOUS | Status: AC
  Filled 2019-11-13: qty 1000

## 2019-11-13 MED ORDER — FENTANYL CITRATE (PF) 100 MCG/2ML IJ SOLN
INTRAMUSCULAR | Status: DC | PRN
  Administered 2019-11-13: 25 ug via INTRAVENOUS

## 2019-11-13 MED ORDER — CEFAZOLIN SODIUM-DEXTROSE 2-4 GM/100ML-% IV SOLN
INTRAVENOUS | Status: AC
  Filled 2019-11-13: qty 100

## 2019-11-13 MED ORDER — LIDOCAINE 2% (20 MG/ML) 5 ML SYRINGE
INTRAMUSCULAR | Status: DC | PRN
  Administered 2019-11-13: 50 mg via INTRAVENOUS

## 2019-11-13 MED ORDER — EPHEDRINE SULFATE 50 MG/ML IJ SOLN
INTRAMUSCULAR | Status: DC | PRN
  Administered 2019-11-13: 10 mg via INTRAVENOUS

## 2019-11-13 MED ORDER — DEXAMETHASONE SODIUM PHOSPHATE 4 MG/ML IJ SOLN
INTRAMUSCULAR | Status: DC | PRN
  Administered 2019-11-13: 10 mg via INTRAVENOUS

## 2019-11-13 MED ORDER — BUPIVACAINE HCL (PF) 0.25 % IJ SOLN
INTRAMUSCULAR | Status: AC
  Filled 2019-11-13: qty 60

## 2019-11-13 MED ORDER — BUPIVACAINE HCL (PF) 0.25 % IJ SOLN
INTRAMUSCULAR | Status: DC | PRN
  Administered 2019-11-13: 7 mL

## 2019-11-13 MED ORDER — DEXMEDETOMIDINE HCL IN NACL 200 MCG/50ML IV SOLN
INTRAVENOUS | Status: DC | PRN
  Administered 2019-11-13: 8 ug via INTRAVENOUS

## 2019-11-13 SURGICAL SUPPLY — 52 items
ADH SKN CLS APL DERMABOND .7 (GAUZE/BANDAGES/DRESSINGS) ×1
APL PRP STRL LF DISP 70% ISPRP (MISCELLANEOUS) ×1
APL SKNCLS STERI-STRIP NONHPOA (GAUZE/BANDAGES/DRESSINGS) ×1
BAG DECANTER FOR FLEXI CONT (MISCELLANEOUS) ×2 IMPLANT
BENZOIN TINCTURE PRP APPL 2/3 (GAUZE/BANDAGES/DRESSINGS) ×2 IMPLANT
BLADE SURG 11 STRL SS (BLADE) ×2 IMPLANT
BLADE SURG 15 STRL LF DISP TIS (BLADE) ×1 IMPLANT
BLADE SURG 15 STRL SS (BLADE) ×2
CHLORAPREP W/TINT 26 (MISCELLANEOUS) ×2 IMPLANT
COVER BACK TABLE 60X90IN (DRAPES) ×2 IMPLANT
COVER MAYO STAND STRL (DRAPES) ×2 IMPLANT
COVER PROBE 5X48 (MISCELLANEOUS) ×2
DERMABOND ADVANCED (GAUZE/BANDAGES/DRESSINGS) ×1
DERMABOND ADVANCED .7 DNX12 (GAUZE/BANDAGES/DRESSINGS) ×1 IMPLANT
DRAPE C-ARM 42X72 X-RAY (DRAPES) ×2 IMPLANT
DRAPE LAPAROSCOPIC ABDOMINAL (DRAPES) ×2 IMPLANT
DRAPE UTILITY XL STRL (DRAPES) ×2 IMPLANT
DRSG TEGADERM 4X4.75 (GAUZE/BANDAGES/DRESSINGS) ×2 IMPLANT
ELECT COATED BLADE 2.86 ST (ELECTRODE) ×2 IMPLANT
ELECT REM PT RETURN 9FT ADLT (ELECTROSURGICAL) ×2
ELECTRODE REM PT RTRN 9FT ADLT (ELECTROSURGICAL) ×1 IMPLANT
GAUZE SPONGE 4X4 12PLY STRL LF (GAUZE/BANDAGES/DRESSINGS) ×2 IMPLANT
GLOVE BIO SURGEON STRL SZ 6.5 (GLOVE) ×1 IMPLANT
GLOVE BIO SURGEON STRL SZ7 (GLOVE) ×2 IMPLANT
GLOVE BIOGEL PI IND STRL 7.0 (GLOVE) IMPLANT
GLOVE BIOGEL PI IND STRL 7.5 (GLOVE) ×1 IMPLANT
GLOVE BIOGEL PI INDICATOR 7.0 (GLOVE) ×1
GLOVE BIOGEL PI INDICATOR 7.5 (GLOVE) ×1
GOWN STRL REUS W/ TWL LRG LVL3 (GOWN DISPOSABLE) ×2 IMPLANT
GOWN STRL REUS W/TWL LRG LVL3 (GOWN DISPOSABLE) ×4
IV KIT MINILOC 20X1 SAFETY (NEEDLE) IMPLANT
KIT CVR 48X5XPRB PLUP LF (MISCELLANEOUS) IMPLANT
KIT PORT POWER 8FR ISP CVUE (Port) ×1 IMPLANT
NDL HYPO 25X1 1.5 SAFETY (NEEDLE) ×1 IMPLANT
NDL SAFETY ECLIPSE 18X1.5 (NEEDLE) IMPLANT
NEEDLE HYPO 18GX1.5 SHARP (NEEDLE)
NEEDLE HYPO 25X1 1.5 SAFETY (NEEDLE) ×2 IMPLANT
PACK BASIN DAY SURGERY FS (CUSTOM PROCEDURE TRAY) ×2 IMPLANT
PENCIL SMOKE EVACUATOR (MISCELLANEOUS) ×2 IMPLANT
SLEEVE SCD COMPRESS KNEE MED (MISCELLANEOUS) ×2 IMPLANT
STRIP CLOSURE SKIN 1/2X4 (GAUZE/BANDAGES/DRESSINGS) ×2 IMPLANT
SUT MNCRL AB 4-0 PS2 18 (SUTURE) ×2 IMPLANT
SUT PROLENE 2 0 SH DA (SUTURE) ×2 IMPLANT
SUT SILK 2 0 TIES 17X18 (SUTURE)
SUT SILK 2-0 18XBRD TIE BLK (SUTURE) IMPLANT
SUT VIC AB 3-0 SH 27 (SUTURE) ×2
SUT VIC AB 3-0 SH 27X BRD (SUTURE) ×1 IMPLANT
SYR 5ML LUER SLIP (SYRINGE) ×2 IMPLANT
SYR CONTROL 10ML LL (SYRINGE) ×2 IMPLANT
TOWEL GREEN STERILE FF (TOWEL DISPOSABLE) ×2 IMPLANT
TUBE CONNECTING 20X1/4 (TUBING) IMPLANT
YANKAUER SUCT BULB TIP NO VENT (SUCTIONS) IMPLANT

## 2019-11-13 NOTE — Interval H&P Note (Signed)
History and Physical Interval Note:  11/13/2019 7:16 AM  Erin [name]  has presented today for surgery, with the diagnosis of BREAST CANCER STAGE IV.  The various methods of treatment have been discussed with the patient and family. After consideration of risks, benefits and other options for treatment, the patient has consented to  Procedure(s): INSERTION PORT-A-CATH [place] (N/A) as a surgical intervention.  The patient's history has been reviewed, patient examined, no change in status, stable for surgery.  I have reviewed the patient's chart and labs.  Questions were answered to the patient's satisfaction.     Rolm Bookbinder

## 2019-11-13 NOTE — Progress Notes (Signed)
Patient Care Team: Antony Blackbird, MD as PCP - General (Family Medicine) Excell Seltzer, MD (Inactive) as Consulting Physician (General Surgery) [name], MD as Consulting Physician (Hematology and Oncology) Kyung Rudd, MD as Consulting Physician (Radiation Oncology) [name], MD as Consulting Physician (Gynecologic Oncology) Delice Bison, Charlestine Massed, NP as Nurse Practitioner (Hematology and Oncology) Mauro Kaufmann, RN as Oncology Nurse Navigator Rockwell Germany, RN as Oncology Nurse Navigator  DIAGNOSIS:    ICD-10-CM   1. 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 Malignant neoplasm of lower-outer quadrant of left breast of female, estrogen receptor negative ([place])  C50.512    Z17.1     SUMMARY OF ONCOLOGIC HISTORY: Oncology History  Malignant neoplasm of lower-outer quadrant of left breast of female, estrogen receptor negative ([place])  05/11/2017 Initial Diagnosis   Left breast biopsy 3:30 position 6 cm from nipple: IDC with DCIS, lymphovascular invasion present, grade 2-3, ER 0%, PR 0%, HER-2 positive ratio 2.28, Ki-67 20%, 1 cm lesion left breast, T1b N0 stage IA clinical stage   06/09/2017 Surgery   Left lumpectomy: IDC grade 3, 1.2 cm, DCIS is present, margins negative, 0/6 lymph nodes negative, ER 0%, PR 0%, HER-2 positive ratio 2.28, Ki-67 20%, T1 BN 0 stage IA   07/09/2017 - 09/29/2017 Adjuvant Chemotherapy   Taxol/Herceptin x 12 completed on 09/29/2017, went on to receive every 3 week adjuvant Herceptin through 06/28/2018   11/25/2017 - 01/07/2018 Radiation Therapy    The patient initially received a dose of 50.4 Gy in 28 fractions to the breast using whole-breast tangent fields. This was delivered using a 3-D conformal technique. The patient then received a boost to the seroma. This delivered [name] additional 10 Gy in 5 fractions using a 3-D technique. The total dose was 60.4 Gy.   02/15/2018 Genetic Testing   The Common Hereditary  Cancer Panel offered by [facility] includes sequencing and/or deletion duplication testing of the following 47 genes: APC, ATM, AXIN2, BARD1, BMPR1A, BRCA1, BRCA2, BRIP1, CDH1, CDKN2A (p14ARF), CDKN2A (p16INK4a), CKD4, CHEK2, CTNNA1, DICER1, EPCAM (Deletion/duplication testing only), GREM1 (promoter region deletion/duplication testing only), KIT, MEN1, MLH1, MSH2, MSH3, MSH6, MUTYH, NBN, NF1, NHTL1, PALB2, PDGFRA, PMS2, POLD1, POLE, PTEN, RAD50, RAD51C, RAD51D, SDHB, SDHC, SDHD, SMAD4, SMARCA4. STK11, TP53, TSC1, TSC2, and VHL.  The following genes were evaluated for sequence changes only: SDHA and HOXB13 c.251G>A variant only.   Results: POSITIVE. 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 The date of this test report is 02/15/2018   10/20/2019 PET scan   Bone mets involving Right hip, Bil Iliac bones, T8, T9, L2 and L3, Rt prox humerus, Righ glenoid. Some lytic and some sclerotic. Mild Left supraclav adenopathy, 2 small lesions basal ganglia (could be lacunar infarcts)   11/14/2019 -  Chemotherapy   The patient had ado-trastuzumab emtansine (KADCYLA) 260 mg in sodium chloride 0.9 % 250 mL chemo infusion, 3.6 mg/kg = 260 mg, Intravenous, Once, 0 of 6 cycles  for chemotherapy treatment.    Fallopian tube cancer, carcinoma, right ([place])  11/04/2017 Surgery   Uterus, cervix and bilateral fallopian tubes: Fallopian tube with serous carcinoma 0.6 cm positive for p53, PAX8, WT-1, MOC-31, cytokeratin 7, and estrogen receptor   01/11/2018 -  Chemotherapy   Taxol and carboplatin every 3 weeks (Taxol discontinued with cycle 2 because of profound rash)   10/20/2019 PET scan   Bone mets involving Right hip, Bil Iliac bones, T8, T9, L2 and L3, Rt prox humerus, Righ glenoid.  Some lytic and some sclerotic. Mild Left supraclav adenopathy, 2 small lesions basal ganglia (could be lacunar infarcts)   11/14/2019 -  Chemotherapy   The patient had ado-trastuzumab emtansine (KADCYLA) 260 mg in sodium  chloride 0.9 % 250 mL chemo infusion, 3.6 mg/kg = 260 mg, Intravenous, Once, 0 of 6 cycles  for chemotherapy treatment.      CHIEF COMPLIANT: Follow-up of Kadcyla treatment   INTERVAL HISTORY: Erin [name] is a 59 y.o. with above-mentioned history of metastatic breast cancer with bone metastases and fallopian tube cancer. Her port was placed by Dr. Donne Hazel on 11/13/19. She is currently on treatment with Kadcyla every 3 weeks. She presents to the clinic today for treatment.   ALLERGIES:  has No Known Allergies.  MEDICATIONS:  Current Outpatient Medications  Medication Sig Dispense Refill  . Accu-Chek FastClix Lancets MISC Use as instructed. Inject into the skin twice daily 100 each 3  . atorvastatin (LIPITOR) 40 MG tablet Take 1 tablet (40 mg total) by mouth daily. 90 tablet 1  . Blood Glucose Calibration (ACCU-CHEK GUIDE CONTROL) LIQD 1 each by In Vitro route once as needed for up to 1 dose. 1 each 0  . cholecalciferol (VITAMIN D) 1000 units tablet Take 1,000 Units by mouth daily.    . ferrous sulfate 325 (65 FE) MG tablet Take 1 tablet (325 mg total) by mouth daily before breakfast. (Patient taking differently: Take 325 mg by mouth daily before breakfast. ) 90 tablet 1  . glimepiride (AMARYL) 2 MG tablet Take 1 tablet (2 mg total) by mouth daily with breakfast. To lower blood sugar (Patient taking differently: Take 2 mg by mouth daily with breakfast. To lower blood sugar) 90 tablet 3  . glucose blood (ACCU-CHEK GUIDE) test strip Use as instructed. Check blood glucose by fingerstick twice per day. 100 each 12  . Lancet Devices (ACCU-CHEK SOFTCLIX) lancets Use as instructed 1 each 0  . lidocaine-prilocaine (EMLA) cream Apply to affected area once 30 g 3  . lisinopril (ZESTRIL) 10 MG tablet Take 1 tablet (10 mg total) by mouth daily. To lower blood pressure (Patient taking differently: Take 10 mg by mouth daily. To lower blood pressure) 90 tablet 3  . metFORMIN (GLUCOPHAGE-XR) 500 MG 24 hr  tablet TAKE 2 TABLETS BY MOUTH EVERY DAY WITH BREAKFAST (Patient taking differently: Take 1,000 mg by mouth at bedtime. ) 180 tablet 0  . methocarbamol (ROBAXIN) 500 MG tablet Take one pill every 8 hours during the day and two pills at bedtime as needed for muscle spasms. (Patient taking differently: Take 500 mg by mouth 2 (two) times daily. 10-05-2019 per pt takes one in morning and two at bedtime Take one pill every 8 hours during the day and two pills at bedtime as needed for muscle spasms.) 90 tablet 0  . oxyCODONE (OXY IR/ROXICODONE) 5 MG immediate release tablet Take 1 tablet (5 mg total) by mouth every 6 (six) hours as needed for moderate pain, severe pain or breakthrough pain. 5 tablet 0   No current facility-administered medications for this visit.    PHYSICAL EXAMINATION: ECOG PERFORMANCE STATUS: 1 - Symptomatic but completely ambulatory  Vitals:   11/14/19 1145  BP: 118/63  Pulse: 81  Resp: 17  Temp: 98.2 F (36.8 C)  SpO2: 99%   Filed Weights   11/14/19 1145  Weight: 160 lb 14.4 oz (73 kg)    LABORATORY DATA:  I have reviewed the data as listed CMP Latest Ref Rng & Units  11/10/2019 10/06/2019 09/06/2019  Glucose 70 - 99 mg/dL 73 101(H) 97  BUN 6 - 20 mg/dL _0 Creatinine 0.44 - 1.00 mg/dL 0.55 0.70 0.77  Sodium 135 - 145 mmol/L 140 139 141  Potassium 3.5 - 5.1 mmol/L 4.4 4.2 4.1  Chloride 98 - 111 mmol/L 106 104 105  CO2 22 - 32 mmol/L _1 Calcium 8.9 - 10.3 mg/dL 9.3 9.2 9.2  Total Protein 6.5 - 8.1 g/dL - - 7.5  Total Bilirubin 0.3 - 1.2 mg/dL - - 0.2(L)  Alkaline Phos 38 - 126 U/L - - 96  AST 15 - 41 U/L - - 18  ALT 0 - 44 U/L - - 22    Lab Results  Component Value Date   WBC 17.0 (H) 11/14/2019   HGB 11.0 (L) 11/14/2019   HCT 33.4 (L) 11/14/2019   MCV 95.2 11/14/2019   PLT 312 11/14/2019   NEUTROABS 12.8 (H) 11/14/2019    ASSESSMENT & PLAN:  Malignant neoplasm of lower-outer quadrant of left breast of female, estrogen receptor negative  (HCC) Left lumpectomy: IDC grade 3, 1.2 cm, DCIS is present, margins negative, 0/6 lymph nodes negative, ER 0%, PR 0%, HER-2 positive ratio 2.28, Ki-67 20%, T1 BN 0 stage IA S/P Taxol-Herceptin BRCA 2 Mutation Positive  10/20/19:Bone mets involving Right hip, Bil Iliac bones, T8, T9, L2 and L3, Rt prox humerus, Righ glenoid. Some lytic and some sclerotic. Mild Left supraclav adenopathy, 2 small lesions basal ganglia (could be lacunar infarcts)  Biopsy the supra clav LN: 10/31/2019: Metastatic breast cancer, ER/PR negative HER-2 equivocal by IHC, FISH positive Treatment plan: Kadcyla every 3-week treatment. ---------------------------------------------------------------------------------------------------------------------------------------------------------- Echocardiogram 11/14/2019 Current treatment: Cycle 1 Kadcyla Return to clinic in 3 weeks for cycle 2.     No orders of the defined types were placed in this encounter.  The patient has a good understanding of the overall plan. she agrees with it. she will call with any problems that may develop before the next visit here.  Total time spent: 30 mins including face to face time and time spent for planning, charting and coordination of care  [name], MD 11/14/2019  I, Cloyde Reams Dorshimer, am acting as scribe for [name].  I have reviewed the above documentation for accuracy and completeness, and I agree with the above.

## 2019-11-13 NOTE — Anesthesia Procedure Notes (Signed)
Procedure Name: LMA Insertion Date/Time: 11/13/2019 7:38 AM Performed by: Marrianne Mood, CRNA Pre-anesthesia Checklist: Patient identified, Emergency Drugs available, Suction available, Patient being monitored and Timeout performed Patient Re-evaluated:Patient Re-evaluated prior to induction Oxygen Delivery Method: Circle system utilized Preoxygenation: Pre-oxygenation with 100% oxygen Induction Type: IV induction Ventilation: Mask ventilation without difficulty LMA: LMA inserted LMA Size: 4.0 Number of attempts: 1 Airway Equipment and Method: Bite block Placement Confirmation: positive ETCO2 Tube secured with: Tape Dental Injury: Teeth and Oropharynx as per pre-operative assessment

## 2019-11-13 NOTE — Anesthesia Postprocedure Evaluation (Signed)
Anesthesia Post Note  Patient: Erin [name]  Procedure(s) Performed: INSERTION PORT-A-CATH WITH ULTRASOUND GUIDANCE (Right Chest)     Patient location during evaluation: PACU Anesthesia Type: General Level of consciousness: awake and alert Pain management: pain level controlled Vital Signs Assessment: post-procedure vital signs reviewed and stable Respiratory status: spontaneous breathing, nonlabored ventilation, respiratory function stable and patient connected to nasal cannula oxygen Cardiovascular status: blood pressure returned to baseline and stable Postop Assessment: no apparent nausea or vomiting Anesthetic complications: no    Last Vitals:  Vitals:   11/13/19 0845 11/13/19 0930  BP: 128/78 115/72  Pulse: 89 81  Resp: 17 16  Temp:  36.6 C  SpO2: 98% 99%    Last Pain:  Vitals:   11/13/19 0930  TempSrc:   PainSc: 2                  Barnet Glasgow

## 2019-11-13 NOTE — Transfer of Care (Signed)
Immediate Anesthesia Transfer of Care Note  Patient: Erin [name]  Procedure(s) Performed: INSERTION PORT-A-CATH WITH ULTRASOUND GUIDANCE (Right Chest)  Patient Location: PACU  Anesthesia Type:General  Level of Consciousness: awake and patient cooperative  Airway & Oxygen Therapy: Patient Spontanous Breathing and Patient connected to face mask oxygen  Post-op Assessment: Report given to RN and Post -op Vital signs reviewed and stable  Post vital signs: Reviewed and stable  Last Vitals:  Vitals Value Taken Time  BP    Temp    Pulse    Resp    SpO2      Last Pain:  Vitals:   11/13/19 0711  TempSrc: Temporal  PainSc: 0-No pain      Patients Stated Pain Goal: 3 ([id number])  Complications: No apparent anesthesia complications

## 2019-11-13 NOTE — Discharge Instructions (Signed)

## 2019-11-13 NOTE — H&P (Signed)
Erin [name] is an 59 y.o. female.   Chief Complaint: stage IV breast cancer HPI: 3 yof with stage IV breast cancer, needs port placement for therapy.  Past Medical History:  Diagnosis Date  . Arthritis    knees, elbows  . Borderline glaucoma of right eye   . BRCA2 gene mutation positive 02/17/2018   BRCA2 c.4552del (p.Glu1518Asnfs*25)  Result reported out on 02/15/2018.   . Breast cancer ([place]) 05/2017   left  . Chronic back pain   . Deaf    per pt born hearing and at age 55 1/2 lost hearing , was told by mother unknown cause, can miminally hear in left and no hearing on right   . Depression   . Dyspnea    states some SOB with ADLs and anemia   . Elevated cancer antigen 125 (CA 125)   . Family history of breast cancer   . Family history of breast cancer   . Family history of colon cancer   . Genetic testing 02/17/2018   The Common Hereditary Cancer Panel offered by [facility] includes sequencing and/or deletion duplication testing of the following 47 genes: APC, ATM, AXIN2, BARD1, BMPR1A, BRCA1, BRCA2, BRIP1, CDH1, CDKN2A (p14ARF), CDKN2A (p16INK4a), CKD4, CHEK2, CTNNA1, DICER1, EPCAM (Deletion/duplication testing only), GREM1 (promoter region deletion/duplication testing only), KIT, MEN1, MLH1, MSH2, MSH3, MSH6, MU  . History of cancer chemotherapy    left breast cancer 09-08-2017 to 10-09-2017;  fallopion tube cancer  12-15-2017  to 06-28-2018  . 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 History of external beam radiation therapy    left breast  11-25-2017  to 01-07-2018  . Hyperlipidemia   . Hypertension    followed by pcp   (10-05-2019  per pt never had stress test)  . IDA (iron deficiency anemia)   . Malignant neoplasm of lower-outer quadrant of left breast of female, estrogen receptor negative [facility]) oncologist--  dr [name]   dx 08/ 2018--- Stage IA, DCIS,  ER/ PR negative,  HER-2 positive;  06-09-2017 s/p left breast lumpectomy with node dissection;  completed chemo 10-09-2017  and radiation 01-07-2018/  hercepton completed 06-28-2018  . Non-insulin dependent type 2 diabetes mellitus ([place])    followed by pcp   (10-05-2019 per pt check cbg every other day in AM,  fasting cbg-- 105)  . Numbness of right thumb   . Wears glasses     Past Surgical History:  Procedure Laterality Date  . BREAST LUMPECTOMY WITH RADIOACTIVE SEED AND SENTINEL LYMPH NODE BIOPSY Left 06/09/2017   Procedure: BREAST LUMPECTOMY WITH RADIOACTIVE SEED AND SENTINEL LYMPH NODE BIOPSY;  Surgeon: Excell Seltzer, MD;  Location: [facility];  Service: General;  Laterality: Left;  . COLONOSCOPY  11/02/2017   polyps  . HERNIA REPAIR    . HIATAL HERNIA REPAIR  09/2019  . LAPAROSCOPIC ASSISTED VAGINAL HYSTERECTOMY N/A 11/04/2017   Procedure: LAPAROSCOPIC ASSISTED VAGINAL HYSTERECTOMY;  Surgeon: [name], MD;  Location: [facility];  Service: Gynecology;  Laterality: N/A;  . LAPAROSCOPIC BILATERAL SALPINGO OOPHERECTOMY Bilateral 11/04/2017   Procedure: LAPAROSCOPIC BILATERAL SALPINGO OOPHORECTOMY;  Surgeon: [name], MD;  Location: [facility];  Service: Gynecology;  Laterality: Bilateral;  . LAPAROSCOPY N/A 10/09/2019   Procedure: LAPAROSCOPY DIAGNOSTIC WITH PERITONEAL BIOPSIES;  Surgeon: [name], MD;  Location: [facility];  Service: Gynecology;  Laterality: N/A;  . PORTACATH PLACEMENT Right 06/09/2017   Procedure: INSERTION PORT-A-CATH WITH [place];  Surgeon: Excell Seltzer, MD;  Location: [facility];  Service: General;  Laterality: Right;  . TUBAL LIGATION  02/02/2002   _0    PPTL  . UPPER GI ENDOSCOPY      Family History  Problem Relation Age of Onset  . Hypertension Mother   . Diabetes Father   . Hypertension Sister   . Diabetes Paternal Grandmother   . Colon cancer Paternal  Grandmother 55  . CAD Brother   . Diabetes Brother   . Breast cancer Maternal Aunt        dx >50  . Heart attack Paternal Grandfather   . Breast cancer Maternal Aunt        dx under 19  . Breast cancer Maternal Aunt        dx  under 30   Social History:  reports that she has never smoked. She has never used smokeless tobacco. She reports that she does not drink alcohol or use drugs.  Allergies: No Known Allergies  Medications Prior to Admission  Medication Sig Dispense Refill  . atorvastatin (LIPITOR) 40 MG tablet Take 1 tablet (40 mg total) by mouth daily. 90 tablet 1  . cholecalciferol (VITAMIN D) 1000 units tablet Take 1,000 Units by mouth daily.    . ferrous sulfate 325 (65 FE) MG tablet Take 1 tablet (325 mg total) by mouth daily before breakfast. (Patient taking differently: Take 325 mg by mouth daily before breakfast. ) 90 tablet 1  . glimepiride (AMARYL) 2 MG tablet Take 1 tablet (2 mg total) by mouth daily with breakfast. To lower blood sugar (Patient taking differently: Take 2 mg by mouth daily with breakfast. To lower blood sugar) 90 tablet 3  . lidocaine-prilocaine (EMLA) cream Apply to affected area once 30 g 3  . lisinopril (ZESTRIL) 10 MG tablet Take 1 tablet (10 mg total) by mouth daily. To lower blood pressure (Patient taking differently: Take 10 mg by mouth daily. To lower blood pressure) 90 tablet 3  . metFORMIN (GLUCOPHAGE-XR) 500 MG 24 hr tablet TAKE 2 TABLETS BY MOUTH EVERY DAY WITH BREAKFAST (Patient taking differently: Take 1,000 mg by mouth at bedtime. ) 180 tablet 0  . methocarbamol (ROBAXIN) 500 MG tablet Take one pill every 8 hours during the day and two pills at bedtime as needed for muscle spasms. (Patient taking differently: Take 500 mg by mouth 2 (two) times daily. 10-05-2019 per pt takes one in morning and two at bedtime Take one pill every 8 hours during the day and two pills at bedtime as needed for muscle spasms.) 90 tablet 0  . Accu-Chek FastClix Lancets  MISC Use as instructed. Inject into the skin twice daily 100 each 3  . Blood Glucose Calibration (ACCU-CHEK GUIDE CONTROL) LIQD 1 each by In Vitro route once as needed for up to 1 dose. 1 each 0  . glucose blood (ACCU-CHEK GUIDE) test strip Use as instructed. Check blood glucose by fingerstick twice per day. 100 each 12  . Lancet Devices (ACCU-CHEK SOFTCLIX) lancets Use as instructed 1 each 0    No results found for this or any previous visit (from the past 48 hour(s)). No results found.  Review of Systems  All other systems reviewed and are negative.   Blood pressure (!) 115/56, pulse 78, temperature 98 F (36.7 C), temperature source Temporal, resp. rate  18, height _0  (1.549 m), weight 73.2 kg, last menstrual period 07/09/2017, SpO2 100 %. Physical Exam  cv rrr Lungs clear  Assessment/Plan Port placement  Rolm Bookbinder, MD 11/13/2019, 7:15 AM

## 2019-11-13 NOTE — Op Note (Signed)
Preoperative diagnosis:stage IV breast cancer Postoperative diagnosis: saa Procedure: Right ij port placement with US guidance Surgeon: Dr Serita Grammes EBL: minimal Anesthesia: general  Complications none Drains none Specimens:none Sponge and needle count correct times two dispo to recovery stable  Indications: 76 yof with her 2 positive stage four breast cancer needs venous access for therapy.    Procedure:After informed consent was obtained the patient was taken to the operating room. She was given antibiotics. SCDs were placed. She was placed under general anesthesia without complication. She was prepped and draped in the standard sterile surgical fashion. A surgical timeout was then performed.  I identified the internal jugular vein on the right side with the ultrasound. I made a small nick in the skin. I accessed the internal jugular vein with the needle under ultrasound guidance. I passed the wire. The wire was confirmed to be in position with fluoroscopy.The wire was in the vein by ultrasound as well.I then infiltrated Marcaine below the clavicle on the right side. I reentered her old incision and developed a subcutaneous pocket for the port. I then tunneled between the port site as well as the insertion site. I brought the line through this. I then placed the dilator under fluoroscopic guidance over the wire. I removedthe wire andthe dilator. I then placed the line into the sheath. The sheath was then removed. I pulled the line back to be in the distal vena cava. I then hooked this up to the port. This was placed in the pocket and sutured in place with 2-0 Prolene suture. I then closed this with 3-0 Vicryl and 4-0 Monocryl. Glue was placed. I accessed this. It withdrew blood and flushed easily. I packed it with heparin.I left it accessed for chemotherapy tomorrow.

## 2019-11-14 ENCOUNTER — Inpatient Hospital Stay: Payer: Medicaid Other

## 2019-11-14 ENCOUNTER — Inpatient Hospital Stay: Payer: Medicaid Other | Attending: Gynecologic Oncology

## 2019-11-14 ENCOUNTER — Ambulatory Visit (HOSPITAL_COMMUNITY)
Admission: RE | Admit: 2019-11-14 | Discharge: 2019-11-14 | Disposition: A | Discharge status: Home / Self Care | Payer: Medicaid Other | Source: Ambulatory Visit | Attending: Hematology and Oncology | Admitting: Hematology and Oncology

## 2019-11-14 ENCOUNTER — Other Ambulatory Visit: Payer: Self-pay

## 2019-11-14 ENCOUNTER — Inpatient Hospital Stay (HOSPITAL_BASED_OUTPATIENT_CLINIC_OR_DEPARTMENT_OTHER): Payer: Medicaid Other | Admitting: Hematology and Oncology

## 2019-11-14 ENCOUNTER — Encounter: Payer: Self-pay | Admitting: *Deleted

## 2019-11-14 VITALS — BP 118/63 | HR 81 | Temp 98.2°F | Resp 17 | Ht 61.0 in | Wt 160.9 lb

## 2019-11-14 DIAGNOSIS — Z923 Personal history of irradiation: Secondary | ICD-10-CM | POA: Insufficient documentation

## 2019-11-14 DIAGNOSIS — Z171 Estrogen receptor negative status [ER-]: Secondary | ICD-10-CM | POA: Diagnosis not present

## 2019-11-14 DIAGNOSIS — C5701 Malignant neoplasm of right fallopian tube: Secondary | ICD-10-CM

## 2019-11-14 DIAGNOSIS — C50512 Malignant neoplasm of lower-outer quadrant of left female breast: Secondary | ICD-10-CM | POA: Insufficient documentation

## 2019-11-14 DIAGNOSIS — Z7189 Other specified counseling: Secondary | ICD-10-CM

## 2019-11-14 DIAGNOSIS — E119 Type 2 diabetes mellitus without complications: Secondary | ICD-10-CM | POA: Diagnosis not present

## 2019-11-14 DIAGNOSIS — Z7984 Long term (current) use of oral hypoglycemic drugs: Secondary | ICD-10-CM | POA: Insufficient documentation

## 2019-11-14 DIAGNOSIS — D696 Thrombocytopenia, unspecified: Secondary | ICD-10-CM | POA: Insufficient documentation

## 2019-11-14 DIAGNOSIS — C7951 Secondary malignant neoplasm of bone: Secondary | ICD-10-CM

## 2019-11-14 DIAGNOSIS — Z5112 Encounter for antineoplastic immunotherapy: Secondary | ICD-10-CM | POA: Diagnosis not present

## 2019-11-14 DIAGNOSIS — Z79899 Other long term (current) drug therapy: Secondary | ICD-10-CM | POA: Diagnosis not present

## 2019-11-14 DIAGNOSIS — Z9221 Personal history of antineoplastic chemotherapy: Secondary | ICD-10-CM | POA: Diagnosis not present

## 2019-11-14 DIAGNOSIS — Z95828 Presence of other vascular implants and grafts: Secondary | ICD-10-CM

## 2019-11-14 DIAGNOSIS — R112 Nausea with vomiting, unspecified: Secondary | ICD-10-CM | POA: Diagnosis not present

## 2019-11-14 LAB — CBC WITH DIFFERENTIAL (CANCER CENTER ONLY)
Abs Immature Granulocytes: 0.16 10*3/uL — ABNORMAL HIGH (ref 0.00–0.07)
Basophils Absolute: 0 10*3/uL (ref 0.0–0.1)
Basophils Relative: 0 %
Eosinophils Absolute: 0 10*3/uL (ref 0.0–0.5)
Eosinophils Relative: 0 %
HCT: 33.4 % — ABNORMAL LOW (ref 36.0–46.0)
Hemoglobin: 11 g/dL — ABNORMAL LOW (ref 12.0–15.0)
Immature Granulocytes: 1 %
Lymphocytes Relative: 17 %
Lymphs Abs: 2.9 10*3/uL (ref 0.7–4.0)
MCH: 31.3 pg (ref 26.0–34.0)
MCHC: 32.9 g/dL (ref 30.0–36.0)
MCV: 95.2 fL (ref 80.0–100.0)
Monocytes Absolute: 1.2 10*3/uL — ABNORMAL HIGH (ref 0.1–1.0)
Monocytes Relative: 7 %
Neutro Abs: 12.8 10*3/uL — ABNORMAL HIGH (ref 1.7–7.7)
Neutrophils Relative %: 75 %
Platelet Count: 312 10*3/uL (ref 150–400)
RBC: 3.51 MIL/uL — ABNORMAL LOW (ref 3.87–5.11)
RDW: 14.1 % (ref 11.5–15.5)
WBC Count: 17 10*3/uL — ABNORMAL HIGH (ref 4.0–10.5)
nRBC: 0 % (ref 0.0–0.2)

## 2019-11-14 LAB — CMP (CANCER CENTER ONLY)
ALT: 18 U/L (ref 0–44)
AST: 15 U/L (ref 15–41)
Albumin: 3.7 g/dL (ref 3.5–5.0)
Alkaline Phosphatase: 91 U/L (ref 38–126)
Anion gap: 11 (ref 5–15)
BUN: 12 mg/dL (ref 6–20)
CO2: 25 mmol/L (ref 22–32)
Calcium: 9.5 mg/dL (ref 8.9–10.3)
Chloride: 107 mmol/L (ref 98–111)
Creatinine: 0.71 mg/dL (ref 0.44–1.00)
GFR, Est AFR Am: >60 mL/min (ref >60)
GFR, Estimated: >60 mL/min (ref >60)
Glucose, Bld: 77 mg/dL (ref 70–99)
Potassium: 3.8 mmol/L (ref 3.5–5.1)
Sodium: 143 mmol/L (ref 135–145)
Total Bilirubin: <0.2 mg/dL — ABNORMAL LOW (ref 0.3–1.2)
Total Protein: 7.7 g/dL (ref 6.5–8.1)

## 2019-11-14 MED ORDER — SODIUM CHLORIDE 0.9 % IV SOLN
Freq: Once | INTRAVENOUS | Status: AC
  Filled 2019-11-14: qty 250

## 2019-11-14 MED ORDER — ACETAMINOPHEN 325 MG PO TABS
650.0000 mg | ORAL_TABLET | Freq: Once | ORAL | Status: AC
  Administered 2019-11-14: 650 mg via ORAL

## 2019-11-14 MED ORDER — HEPARIN SOD (PORK) LOCK FLUSH 100 UNIT/ML IV SOLN
500.0000 [IU] | Freq: Once | INTRAVENOUS | Status: AC | PRN
  Administered 2019-11-14: 500 [IU]
  Filled 2019-11-14: qty 5

## 2019-11-14 MED ORDER — METHOCARBAMOL 500 MG PO TABS: 500.0000 mg | ORAL_TABLET | Freq: Two times a day (BID) | ORAL | 0 refills | Status: DC

## 2019-11-14 MED ORDER — SODIUM CHLORIDE 0.9 % IV SOLN
3.6000 mg/kg | Freq: Once | INTRAVENOUS | Status: AC
  Administered 2019-11-14: 260 mg via INTRAVENOUS
  Filled 2019-11-14: qty 8

## 2019-11-14 MED ORDER — PANTOPRAZOLE SODIUM 20 MG PO TBEC: 20.0000 mg | DELAYED_RELEASE_TABLET | Freq: Every day | ORAL | 0 refills | Status: DC

## 2019-11-14 MED ORDER — LIDOCAINE-PRILOCAINE 2.5-2.5 % EX CREA: TOPICAL_CREAM | CUTANEOUS | 3 refills | Status: DC

## 2019-11-14 MED ORDER — ACETAMINOPHEN 325 MG PO TABS
ORAL_TABLET | ORAL | Status: AC
  Filled 2019-11-14: qty 2

## 2019-11-14 MED ORDER — SODIUM CHLORIDE 0.9% FLUSH
10.0000 mL | Freq: Once | INTRAVENOUS | Status: AC
  Administered 2019-11-14: 10 mL via INTRAVENOUS
  Filled 2019-11-14: qty 10

## 2019-11-14 MED ORDER — SODIUM CHLORIDE 0.9% FLUSH
10.0000 mL | INTRAVENOUS | Status: DC | PRN
  Administered 2019-11-14: 10 mL
  Filled 2019-11-14: qty 10

## 2019-11-14 MED ORDER — DIPHENHYDRAMINE HCL 25 MG PO CAPS
ORAL_CAPSULE | ORAL | Status: AC
  Filled 2019-11-14: qty 2

## 2019-11-14 MED ORDER — DIPHENHYDRAMINE HCL 25 MG PO CAPS
50.0000 mg | ORAL_CAPSULE | Freq: Once | ORAL | Status: AC
  Administered 2019-11-14: 50 mg via ORAL

## 2019-11-14 NOTE — Progress Notes (Signed)
Ok to treat w/o today's ECHO result per Dr. [name].   05/31/18 EF = 60-65%  Kennith Center, Pharm.D., CPP 11/14/2019@12 :20 PM

## 2019-11-14 NOTE — Patient Instructions (Signed)
[facility] Discharge Instructions for Patients Receiving Chemotherapy  Today you received the following chemotherapy agents Kadcyla.  To help prevent nausea and vomiting after your treatment, we encourage you to take your nausea medication.   If you develop nausea and vomiting that is not controlled by your nausea medication, call the clinic.   BELOW ARE SYMPTOMS THAT SHOULD BE REPORTED IMMEDIATELY:  *FEVER GREATER THAN 100.5 F  *CHILLS WITH OR WITHOUT FEVER  NAUSEA AND VOMITING THAT IS NOT CONTROLLED WITH YOUR NAUSEA MEDICATION  *UNUSUAL SHORTNESS OF BREATH  *UNUSUAL BRUISING OR BLEEDING  TENDERNESS IN MOUTH AND THROAT WITH OR WITHOUT PRESENCE OF ULCERS  *URINARY PROBLEMS  *BOWEL PROBLEMS  UNUSUAL RASH Items with * indicate a potential emergency and should be followed up as soon as possible.  Feel free to call the clinic should you have any questions or concerns. The clinic phone number is [phone].  Please show the [facility] at check-in to the Emergency Department and triage nurse.  Trastuzumab injection for infusion What is this medicine? TRASTUZUMAB (tras TOO zoo mab) is a monoclonal antibody. It is used to treat breast cancer and stomach cancer. This medicine may be used for other purposes; ask your health care provider or pharmacist if you have questions. COMMON BRAND NAME(S): Herceptin, Galvin Proffer, Trazimera What should I tell my health care provider before I take this medicine? They need to know if you have any of these conditions:  heart disease  heart failure  lung or breathing disease, like asthma  an unusual or allergic reaction to trastuzumab, benzyl alcohol, or other medications, foods, dyes, or preservatives  pregnant or trying to get pregnant  breast-feeding How should I use this medicine? This drug is given as an infusion into a vein. It is administered in a hospital or clinic by a specially  trained health care professional. Talk to your pediatrician regarding the use of this medicine in children. This medicine is not approved for use in children. Overdosage: If you think you have taken too much of this medicine contact a poison control center or emergency room at once. NOTE: This medicine is only for you. Do not share this medicine with others. What if I miss a dose? It is important not to miss a dose. Call your doctor or health care professional if you are unable to keep an appointment. What may interact with this medicine? This medicine may interact with the following medications:  certain types of chemotherapy, such as daunorubicin, doxorubicin, epirubicin, and idarubicin This list may not describe all possible interactions. Give your health care provider a list of all the medicines, herbs, non-prescription drugs, or dietary supplements you use. Also tell them if you smoke, drink alcohol, or use illegal drugs. Some items may interact with your medicine. What should I watch for while using this medicine? Visit your doctor for checks on your progress. Report any side effects. Continue your course of treatment even though you feel ill unless your doctor tells you to stop. Call your doctor or health care professional for advice if you get a fever, chills or sore throat, or other symptoms of a cold or flu. Do not treat yourself. Try to avoid being around people who are sick. You may experience fever, chills and shaking during your first infusion. These effects are usually mild and can be treated with other medicines. Report any side effects during the infusion to your health care professional. Fever and chills usually do  not happen with later infusions. Do not become pregnant while taking this medicine or for 7 months after stopping it. Women should inform their doctor if they wish to become pregnant or think they might be pregnant. Women of child-bearing potential will need to have a  negative pregnancy test before starting this medicine. There is a potential for serious side effects to an unborn child. Talk to your health care professional or pharmacist for more information. Do not breast-feed an infant while taking this medicine or for 7 months after stopping it. Women must use effective birth control with this medicine. What side effects may I notice from receiving this medicine? Side effects that you should report to your doctor or health care professional as soon as possible:  allergic reactions like skin rash, itching or hives, swelling of the face, lips, or tongue  chest pain or palpitations  cough  dizziness  feeling faint or lightheaded, falls  fever  general ill feeling or flu-like symptoms  signs of worsening heart failure like breathing problems; swelling in your legs and feet  unusually weak or tired Side effects that usually do not require medical attention (report to your doctor or health care professional if they continue or are bothersome):  bone pain  changes in taste  diarrhea  joint pain  nausea/vomiting  weight loss This list may not describe all possible side effects. Call your doctor for medical advice about side effects. You may report side effects to FDA at 1-800-FDA-1088. Where should I keep my medicine? This drug is given in a hospital or clinic and will not be stored at home. NOTE: This sheet is a summary. It may not cover all possible information. If you have questions about this medicine, talk to your doctor, pharmacist, or health care provider.  2020 Elsevier/Gold Standard (2016-08-25 14:37:52)

## 2019-11-14 NOTE — Assessment & Plan Note (Signed)
Left lumpectomy: IDC grade 3, 1.2 cm, DCIS is present, margins negative, 0/6 lymph nodes negative, ER 0%, PR 0%, HER-2 positive ratio 2.28, Ki-67 20%, T1 BN 0 stage IA S/P Taxol-Herceptin BRCA 2 Mutation Positive  10/20/19:Bone mets involving Right hip, Bil Iliac bones, T8, T9, L2 and L3, Rt prox humerus, Righ glenoid. Some lytic and some sclerotic. Mild Left supraclav adenopathy, 2 small lesions basal ganglia (could be lacunar infarcts)  Biopsy the supra clav LN: 10/31/2019: Metastatic breast cancer, ER/PR negative HER-2 equivocal by IHC, FISH positive Treatment plan: Kadcyla every 3-week treatment. ---------------------------------------------------------------------------------------------------------------------------------------------------------- Echocardiogram 11/14/2019 Current treatment: Cycle 1 Kadcyla Return to clinic in 3 weeks for cycle 2.

## 2019-11-14 NOTE — Progress Notes (Signed)
  Echocardiogram 2D Echocardiogram has been performed.  Jeffree Cazeau A Terri Malerba 11/14/2019, 10:34 AM

## 2019-11-14 NOTE — Progress Notes (Signed)
First time Kadcyla completed without pt. Complaint. Sign interpreter at bedside entire session. Questions answered. Nutrition and AVS given.

## 2019-11-16 ENCOUNTER — Encounter: Payer: Self-pay | Admitting: *Deleted

## 2019-11-16 ENCOUNTER — Telehealth: Payer: Self-pay | Admitting: *Deleted

## 2019-11-16 NOTE — Telephone Encounter (Signed)
Pt called requesting advice if she can have a regular dental cleaning.  Per Dr. [name] okay for pt to proceed with dental cleaning.  If pt needing any extra work, pt will alert our office for review by MD.

## 2019-11-17 ENCOUNTER — Telehealth: Payer: Self-pay | Admitting: *Deleted

## 2019-11-17 NOTE — Telephone Encounter (Signed)
Pt called stating she is experiencing severe pain in her right shoulder.  Pt denies any recent injury or trauma.  RN instructed pt to go to Urgent Care for evaluation.  Pt states she has a prescription for oxycodone 5 mg and is wanting advice if she should take that.  RN instructed pt to take Oxycodone as prescribed and to apply a heat pack to her shoulder as well.  If this does not relieve symptoms, pt needs to go to urgent care this weekend for evaluation.  Pt verbalized understanding and states she will call the office on Monday with an update in status.

## 2019-11-20 ENCOUNTER — Ambulatory Visit: Payer: Medicaid Other | Admitting: Gynecologic Oncology

## 2019-11-20 ENCOUNTER — Other Ambulatory Visit: Payer: Medicaid Other

## 2019-11-20 NOTE — Progress Notes (Signed)
Patient Care Team: Antony Blackbird, MD as PCP - General (Family Medicine) Excell Seltzer, MD (Inactive) as Consulting Physician (General Surgery) [name], MD as Consulting Physician (Hematology and Oncology) Kyung Rudd, MD as Consulting Physician (Radiation Oncology) [name], MD as Consulting Physician (Gynecologic Oncology) Delice Bison, Charlestine Massed, NP as Nurse Practitioner (Hematology and Oncology) Mauro Kaufmann, RN as Oncology Nurse Navigator Rockwell Germany, RN as Oncology Nurse Navigator  DIAGNOSIS:    ICD-10-CM   1. 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 Malignant neoplasm of lower-outer quadrant of left breast of female, estrogen receptor negative ([place])  C50.512    Z17.1     SUMMARY OF ONCOLOGIC HISTORY: Oncology History  Malignant neoplasm of lower-outer quadrant of left breast of female, estrogen receptor negative ([place])  05/11/2017 Initial Diagnosis   Left breast biopsy 3:30 position 6 cm from nipple: IDC with DCIS, lymphovascular invasion present, grade 2-3, ER 0%, PR 0%, HER-2 positive ratio 2.28, Ki-67 20%, 1 cm lesion left breast, T1b N0 stage IA clinical stage   06/09/2017 Surgery   Left lumpectomy: IDC grade 3, 1.2 cm, DCIS is present, margins negative, 0/6 lymph nodes negative, ER 0%, PR 0%, HER-2 positive ratio 2.28, Ki-67 20%, T1 BN 0 stage IA   07/09/2017 - 09/29/2017 Adjuvant Chemotherapy   Taxol/Herceptin x 12 completed on 09/29/2017, went on to receive every 3 week adjuvant Herceptin through 06/28/2018   11/25/2017 - 01/07/2018 Radiation Therapy    The patient initially received a dose of 50.4 Gy in 28 fractions to the breast using whole-breast tangent fields. This was delivered using a 3-D conformal technique. The patient then received a boost to the seroma. This delivered an additional 10 Gy in 5 fractions using a 3-D technique. The total dose was 60.4 Gy.   02/15/2018 Genetic Testing   The Common Hereditary Cancer Panel offered  by [facility] includes sequencing and/or deletion duplication testing of the following 47 genes: APC, ATM, AXIN2, BARD1, BMPR1A, BRCA1, BRCA2, BRIP1, CDH1, CDKN2A (p14ARF), CDKN2A (p16INK4a), CKD4, CHEK2, CTNNA1, DICER1, EPCAM (Deletion/duplication testing only), GREM1 (promoter region deletion/duplication testing only), KIT, MEN1, MLH1, MSH2, MSH3, MSH6, MUTYH, NBN, NF1, NHTL1, PALB2, PDGFRA, PMS2, POLD1, POLE, PTEN, RAD50, RAD51C, RAD51D, SDHB, SDHC, SDHD, SMAD4, SMARCA4. STK11, TP53, TSC1, TSC2, and VHL.  The following genes were evaluated for sequence changes only: SDHA and HOXB13 c.251G>A variant only.   Results: POSITIVE. 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 The date of this test report is 02/15/2018   10/20/2019 PET scan   Bone mets involving Right hip, Bil Iliac bones, T8, T9, L2 and L3, Rt prox humerus, Righ glenoid. Some lytic and some sclerotic. Mild Left supraclav adenopathy, 2 small lesions basal ganglia (could be lacunar infarcts)   11/14/2019 -  Chemotherapy   The patient had palonosetron (ALOXI) injection 0.25 mg, 0.25 mg, Intravenous,  Once, 0 of 5 cycles ado-trastuzumab emtansine (KADCYLA) 260 mg in sodium chloride 0.9 % 250 mL chemo infusion, 3.6 mg/kg = 260 mg, Intravenous, Once, 1 of 6 cycles Administration: 260 mg (11/14/2019)  for chemotherapy treatment.    Fallopian tube cancer, carcinoma, right ([place])  11/04/2017 Surgery   Uterus, cervix and bilateral fallopian tubes: Fallopian tube with serous carcinoma 0.6 cm positive for p53, PAX8, WT-1, MOC-31, cytokeratin 7, and estrogen receptor   01/11/2018 -  Chemotherapy   Taxol and carboplatin every 3 weeks (Taxol discontinued with cycle 2 because of profound rash)   10/20/2019 PET scan   Bone mets  involving Right hip, Bil Iliac bones, T8, T9, L2 and L3, Rt prox humerus, Righ glenoid. Some lytic and some sclerotic. Mild Left supraclav adenopathy, 2 small lesions basal ganglia (could be lacunar infarcts)     11/14/2019 -  Chemotherapy   The patient had palonosetron (ALOXI) injection 0.25 mg, 0.25 mg, Intravenous,  Once, 0 of 5 cycles ado-trastuzumab emtansine (KADCYLA) 260 mg in sodium chloride 0.9 % 250 mL chemo infusion, 3.6 mg/kg = 260 mg, Intravenous, Once, 1 of 6 cycles Administration: 260 mg (11/14/2019)  for chemotherapy treatment.      CHIEF COMPLIANT: Follow-up of metastatic breast cancer, cycle 2 Kadcyla    INTERVAL HISTORY: Erin [name] is a 59 y.o. with above-mentioned history of metastaticbreast cancerwith bone metastases,and fallopian tube cancer. She is currently on treatment with Kadcyla every 3 weeks. She presents to the clinic today for cycle 2.   She is complaining of worsening nausea and vomiting for 3 to 4 days.  She had some mild Zofran that she took which helped her slightly but it caused a lot of itching around the neck.  She is unclear if it is Zofran related.  Because of the nausea she has not been eating much food.  She tells me that she does not have an appetite.  ALLERGIES:  has No Known Allergies.  MEDICATIONS:  Current Outpatient Medications  Medication Sig Dispense Refill  . Accu-Chek FastClix Lancets MISC Use as instructed. Inject into the skin twice daily 100 each 3  . atorvastatin (LIPITOR) 40 MG tablet Take 1 tablet (40 mg total) by mouth daily. 90 tablet 1  . Blood Glucose Calibration (ACCU-CHEK GUIDE CONTROL) LIQD 1 each by In Vitro route once as needed for up to 1 dose. 1 each 0  . cholecalciferol (VITAMIN D) 1000 units tablet Take 1,000 Units by mouth daily.    . ferrous sulfate 325 (65 FE) MG tablet Take 1 tablet (325 mg total) by mouth daily before breakfast. (Patient taking differently: Take 325 mg by mouth daily before breakfast. ) 90 tablet 1  . glimepiride (AMARYL) 2 MG tablet Take 1 tablet (2 mg total) by mouth daily with breakfast. To lower blood sugar (Patient taking differently: Take 2 mg by mouth daily with breakfast. To lower blood sugar) 90  tablet 3  . glucose blood (ACCU-CHEK GUIDE) test strip Use as instructed. Check blood glucose by fingerstick twice per day. 100 each 12  . Lancet Devices (ACCU-CHEK SOFTCLIX) lancets Use as instructed 1 each 0  . lidocaine-prilocaine (EMLA) cream Apply to affected area once 30 g 3  . lisinopril (ZESTRIL) 10 MG tablet Take 1 tablet (10 mg total) by mouth daily. To lower blood pressure (Patient taking differently: Take 10 mg by mouth daily. To lower blood pressure) 90 tablet 3  . metFORMIN (GLUCOPHAGE-XR) 500 MG 24 hr tablet TAKE 2 TABLETS BY MOUTH EVERY DAY WITH BREAKFAST (Patient taking differently: Take 1,000 mg by mouth at bedtime. ) 180 tablet 0  . methocarbamol (ROBAXIN) 500 MG tablet Take 1 tablet (500 mg total) by mouth 2 (two) times daily. Take one pill every 12 hours during the day and two pills at bedtime as needed for muscle spasms. 60 tablet 0  . ondansetron (ZOFRAN) 8 MG tablet Take 1 tablet (8 mg total) by mouth every 8 (eight) hours as needed for nausea. 30 tablet 3  . oxyCODONE (OXY IR/ROXICODONE) 5 MG immediate release tablet Take 1 tablet (5 mg total) by mouth every 6 (six) hours as  needed for moderate pain, severe pain or breakthrough pain. 5 tablet 0  . pantoprazole (PROTONIX) 20 MG tablet Take 1 tablet (20 mg total) by mouth daily. 14 tablet 0   No current facility-administered medications for this visit.    PHYSICAL EXAMINATION: ECOG PERFORMANCE STATUS: 1 - Symptomatic but completely ambulatory  Vitals:   11/21/19 1148  BP: 126/74  Pulse: 82  Resp: 19  Temp: 99.2 F (37.3 C)  SpO2: 98%   Filed Weights   11/21/19 1148  Weight: 154 lb 14.4 oz (70.3 kg)    LABORATORY DATA:  I have reviewed the data as listed CMP Latest Ref Rng & Units 11/21/2019 11/14/2019 11/10/2019  Glucose 70 - 99 mg/dL 75 77 73  BUN 6 - 20 mg/dL _0 Creatinine 0.44 - 1.00 mg/dL 0.83 0.71 0.55  Sodium 135 - 145 mmol/L 139 143 140  Potassium 3.5 - 5.1 mmol/L 3.5 3.8 4.4  Chloride 98 - 111  mmol/L 102 107 106  CO2 22 - 32 mmol/L _1 Calcium 8.9 - 10.3 mg/dL 9.7 9.5 9.3  Total Protein 6.5 - 8.1 g/dL 8.0 7.7 -  Total Bilirubin 0.3 - 1.2 mg/dL 0.4 <0.2(L) -  Alkaline Phos 38 - 126 U/L 107 91 -  AST 15 - 41 U/L 30 15 -  ALT 0 - 44 U/L 19 18 -    Lab Results  Component Value Date   WBC 12.6 (H) 11/21/2019   HGB 11.3 (L) 11/21/2019   HCT 34.5 (L) 11/21/2019   MCV 92.2 11/21/2019   PLT 98 (L) 11/21/2019   NEUTROABS 8.3 (H) 11/21/2019    ASSESSMENT & PLAN:  Malignant neoplasm of lower-outer quadrant of left breast of female, estrogen receptor negative (HCC) Left lumpectomy: IDC grade 3, 1.2 cm, DCIS is present, margins negative, 0/6 lymph nodes negative, ER 0%, PR 0%, HER-2 positive ratio 2.28, Ki-67 20%, T1 BN 0 stage IA S/P Taxol-Herceptin BRCA 2 Mutation Positive  10/20/19:Bone mets involving Right hip, Bil Iliac bones, T8, T9, L2 and L3, Rt prox humerus, Righ glenoid. Some lytic and some sclerotic. Mild Left supraclav adenopathy, 2 small lesions basal ganglia (could be lacunar infarcts)  Biopsy the supra clav LN: 10/31/2019: Metastatic breast cancer, ER/PR negative HER-2 equivocal by IHC, FISHpositive Treatment plan: Kadcyla every 3-week treatment. ---------------------------------------------------------------------------------------------------------------------------------------------------------- Echocardiogram 11/14/2019 Current treatment: Cycle 1 day 8 Kadcyla Kadcyla toxicities: 1.  Nausea and vomiting: I will add Aloxi to her next chemo plan.  I sent a prescription for Zofran today. 2.  Decreased appetite 3.  Thrombocytopenia: Today's blood count 98. Monitoring closely for toxicities. Return to clinic in 2 weeks for cycle 2  No orders of the defined types were placed in this encounter.  The patient has a good understanding of the overall plan. she agrees with it. she will call with any problems that may develop before the next visit here.  Total time  spent: 30 mins including face to face time and time spent for planning, charting and coordination of care  [name], MD 11/21/2019  I, Cloyde Reams Dorshimer, am acting as scribe for [name].  I have reviewed the above documentation for accuracy and completeness, and I agree with the above.

## 2019-11-21 ENCOUNTER — Other Ambulatory Visit: Payer: Self-pay

## 2019-11-21 ENCOUNTER — Inpatient Hospital Stay (HOSPITAL_BASED_OUTPATIENT_CLINIC_OR_DEPARTMENT_OTHER): Payer: Medicaid Other | Admitting: Hematology and Oncology

## 2019-11-21 ENCOUNTER — Inpatient Hospital Stay: Payer: Medicaid Other

## 2019-11-21 VITALS — BP 126/74 | HR 82 | Temp 99.2°F | Resp 19 | Ht 61.0 in | Wt 154.9 lb

## 2019-11-21 DIAGNOSIS — Z171 Estrogen receptor negative status [ER-]: Secondary | ICD-10-CM | POA: Diagnosis not present

## 2019-11-21 DIAGNOSIS — Z7189 Other specified counseling: Secondary | ICD-10-CM

## 2019-11-21 DIAGNOSIS — C5701 Malignant neoplasm of right fallopian tube: Secondary | ICD-10-CM | POA: Diagnosis not present

## 2019-11-21 DIAGNOSIS — C50512 Malignant neoplasm of lower-outer quadrant of left female breast: Secondary | ICD-10-CM

## 2019-11-21 DIAGNOSIS — Z5112 Encounter for antineoplastic immunotherapy: Secondary | ICD-10-CM | POA: Diagnosis not present

## 2019-11-21 DIAGNOSIS — Z95828 Presence of other vascular implants and grafts: Secondary | ICD-10-CM

## 2019-11-21 LAB — CMP (CANCER CENTER ONLY)
ALT: 19 U/L (ref 0–44)
AST: 30 U/L (ref 15–41)
Albumin: 3.6 g/dL (ref 3.5–5.0)
Alkaline Phosphatase: 107 U/L (ref 38–126)
Anion gap: 11 (ref 5–15)
BUN: 12 mg/dL (ref 6–20)
CO2: 26 mmol/L (ref 22–32)
Calcium: 9.7 mg/dL (ref 8.9–10.3)
Chloride: 102 mmol/L (ref 98–111)
Creatinine: 0.83 mg/dL (ref 0.44–1.00)
GFR, Est AFR Am: >60 mL/min (ref >60)
GFR, Estimated: >60 mL/min (ref >60)
Glucose, Bld: 75 mg/dL (ref 70–99)
Potassium: 3.5 mmol/L (ref 3.5–5.1)
Sodium: 139 mmol/L (ref 135–145)
Total Bilirubin: 0.4 mg/dL (ref 0.3–1.2)
Total Protein: 8 g/dL (ref 6.5–8.1)

## 2019-11-21 LAB — CBC WITH DIFFERENTIAL (CANCER CENTER ONLY)
Abs Immature Granulocytes: 0.12 10*3/uL — ABNORMAL HIGH (ref 0.00–0.07)
Basophils Absolute: 0 10*3/uL (ref 0.0–0.1)
Basophils Relative: 0 %
Eosinophils Absolute: 0.1 10*3/uL (ref 0.0–0.5)
Eosinophils Relative: 0 %
HCT: 34.5 % — ABNORMAL LOW (ref 36.0–46.0)
Hemoglobin: 11.3 g/dL — ABNORMAL LOW (ref 12.0–15.0)
Immature Granulocytes: 1 %
Lymphocytes Relative: 23 %
Lymphs Abs: 2.9 10*3/uL (ref 0.7–4.0)
MCH: 30.2 pg (ref 26.0–34.0)
MCHC: 32.8 g/dL (ref 30.0–36.0)
MCV: 92.2 fL (ref 80.0–100.0)
Monocytes Absolute: 1.2 10*3/uL — ABNORMAL HIGH (ref 0.1–1.0)
Monocytes Relative: 9 %
Neutro Abs: 8.3 10*3/uL — ABNORMAL HIGH (ref 1.7–7.7)
Neutrophils Relative %: 67 %
Platelet Count: 98 10*3/uL — ABNORMAL LOW (ref 150–400)
RBC: 3.74 MIL/uL — ABNORMAL LOW (ref 3.87–5.11)
RDW: 13.7 % (ref 11.5–15.5)
WBC Count: 12.6 10*3/uL — ABNORMAL HIGH (ref 4.0–10.5)
nRBC: 0 % (ref 0.0–0.2)

## 2019-11-21 MED ORDER — SODIUM CHLORIDE 0.9% FLUSH
10.0000 mL | INTRAVENOUS | Status: DC | PRN
  Administered 2019-11-21: 10 mL via INTRAVENOUS
  Filled 2019-11-21: qty 10

## 2019-11-21 MED ORDER — HEPARIN SOD (PORK) LOCK FLUSH 100 UNIT/ML IV SOLN
500.0000 [IU] | Freq: Once | INTRAVENOUS | Status: AC
  Administered 2019-11-21: 500 [IU] via INTRAVENOUS
  Filled 2019-11-21: qty 5

## 2019-11-21 MED ORDER — ONDANSETRON HCL 8 MG PO TABS: 8.0000 mg | ORAL_TABLET | Freq: Three times a day (TID) | ORAL | 3 refills | Status: DC | PRN

## 2019-11-21 MED ORDER — ALTEPLASE 2 MG IJ SOLR
2.0000 mg | Freq: Once | INTRAMUSCULAR | Status: DC | PRN
  Filled 2019-11-21: qty 2

## 2019-11-21 NOTE — Assessment & Plan Note (Signed)
Left lumpectomy: IDC grade 3, 1.2 cm, DCIS is present, margins negative, 0/6 lymph nodes negative, ER 0%, PR 0%, HER-2 positive ratio 2.28, Ki-67 20%, T1 BN 0 stage IA S/P Taxol-Herceptin BRCA 2 Mutation Positive  10/20/19:Bone mets involving Right hip, Bil Iliac bones, T8, T9, L2 and L3, Rt prox humerus, Righ glenoid. Some lytic and some sclerotic. Mild Left supraclav adenopathy, 2 small lesions basal ganglia (could be lacunar infarcts)  Biopsy the supra clav LN: 10/31/2019: Metastatic breast cancer, ER/PR negative HER-2 equivocal by IHC, FISHpositive Treatment plan: Kadcyla every 3-week treatment. ---------------------------------------------------------------------------------------------------------------------------------------------------------- Echocardiogram 11/14/2019 Current treatment: Cycle 1 day 8 Kadcyla Kadcyla toxicities:  Return to clinic in 2 weeks for cycle 2

## 2019-11-23 DIAGNOSIS — C770 Secondary and unspecified malignant neoplasm of lymph nodes of head, face and neck: Secondary | ICD-10-CM | POA: Diagnosis not present

## 2019-11-23 DIAGNOSIS — C50512 Malignant neoplasm of lower-outer quadrant of left female breast: Secondary | ICD-10-CM | POA: Diagnosis not present

## 2019-11-23 DIAGNOSIS — Z171 Estrogen receptor negative status [ER-]: Secondary | ICD-10-CM | POA: Diagnosis not present

## 2019-11-23 DIAGNOSIS — C7989 Secondary malignant neoplasm of other specified sites: Secondary | ICD-10-CM | POA: Diagnosis not present

## 2019-11-30 ENCOUNTER — Other Ambulatory Visit: Payer: Self-pay | Admitting: Family Medicine

## 2019-11-30 DIAGNOSIS — E118 Type 2 diabetes mellitus with unspecified complications: Secondary | ICD-10-CM

## 2019-12-04 NOTE — Progress Notes (Signed)
Patient Care Team: Antony Blackbird, MD as PCP - General (Family Medicine) Excell Seltzer, MD (Inactive) as Consulting Physician (General Surgery) [name], MD as Consulting Physician (Hematology and Oncology) Kyung Rudd, MD as Consulting Physician (Radiation Oncology) [name], MD as Consulting Physician (Gynecologic Oncology) [name], NP as Nurse Practitioner (Hematology and Oncology) Mauro Kaufmann, RN as Oncology Nurse Navigator Rockwell Germany, RN as Oncology Nurse Navigator  DIAGNOSIS:    ICD-10-CM   1. Malignant neoplasm of lower-outer quadrant of left breast of female, estrogen receptor negative ([place])  C50.512    Z17.1     SUMMARY OF ONCOLOGIC HISTORY: Oncology History  Malignant neoplasm of lower-outer quadrant of left breast of female, estrogen receptor negative ([place])  05/11/2017 Initial Diagnosis   Left breast biopsy 3:30 position 6 cm from nipple: IDC with DCIS, lymphovascular invasion present, grade 2-3, ER 0%, PR 0%, HER-2 positive ratio 2.28, Ki-67 20%, 1 cm lesion left breast, T1b N0 stage IA clinical stage   06/09/2017 Surgery   Left lumpectomy: IDC grade 3, 1.2 cm, DCIS is present, margins negative, 0/6 lymph nodes negative, ER 0%, PR 0%, HER-2 positive ratio 2.28, Ki-67 20%, T1 BN 0 stage IA   07/09/2017 - 09/29/2017 Adjuvant Chemotherapy   Taxol/Herceptin x 12 completed on 09/29/2017, went on to receive every 3 week adjuvant Herceptin through 06/28/2018   11/25/2017 - 01/07/2018 Radiation Therapy    The patient initially received a dose of 50.4 Gy in 28 fractions to the breast using whole-breast tangent fields. This was delivered using a 3-D conformal technique. The patient then received a boost to the seroma. This delivered an additional 10 Gy in 5 fractions using a 3-D technique. The total dose was 60.4 Gy.   02/15/2018 Genetic Testing   The Common Hereditary Cancer Panel offered by [facility] includes sequencing and/or deletion duplication  testing of the following 47 genes: APC, ATM, AXIN2, BARD1, BMPR1A, BRCA1, BRCA2, BRIP1, CDH1, CDKN2A (p14ARF), CDKN2A (p16INK4a), CKD4, CHEK2, CTNNA1, DICER1, EPCAM (Deletion/duplication testing only), GREM1 (promoter region deletion/duplication testing only), KIT, MEN1, MLH1, MSH2, MSH3, MSH6, MUTYH, NBN, NF1, NHTL1, PALB2, PDGFRA, PMS2, POLD1, POLE, PTEN, RAD50, RAD51C, RAD51D, SDHB, SDHC, SDHD, SMAD4, SMARCA4. STK11, TP53, TSC1, TSC2, and VHL.  The following genes were evaluated for sequence changes only: SDHA and HOXB13 c.251G>A variant only.   Results: POSITIVE. 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 The date of this test report is 02/15/2018   10/20/2019 PET scan   Bone mets involving Right hip, Bil Iliac bones, T8, T9, L2 and L3, Rt prox humerus, Righ glenoid. Some lytic and some sclerotic. Mild Left supraclav adenopathy, 2 small lesions basal ganglia (could be lacunar infarcts)   11/14/2019 -  Chemotherapy   The patient had palonosetron (ALOXI) injection 0.25 mg, 0.25 mg, Intravenous,  Once, 0 of 5 cycles ado-trastuzumab emtansine (KADCYLA) 260 mg in sodium chloride 0.9 % 250 mL chemo infusion, 3.6 mg/kg = 260 mg, Intravenous, Once, 1 of 6 cycles Administration: 260 mg (11/14/2019)  for chemotherapy treatment.    Fallopian tube cancer, carcinoma, right ([place])  11/04/2017 Surgery   Uterus, cervix and bilateral fallopian tubes: Fallopian tube with serous carcinoma 0.6 cm positive for p53, PAX8, WT-1, MOC-31, cytokeratin 7, and estrogen receptor   01/11/2018 -  Chemotherapy   Taxol and carboplatin every 3 weeks (Taxol discontinued with cycle 2 because of profound rash)   10/20/2019 PET scan   Bone mets involving Right hip, Bil Iliac bones, T8, T9, L2 and L3,  Rt prox humerus, Righ glenoid. Some lytic and some sclerotic. Mild Left supraclav adenopathy, 2 small lesions basal ganglia (could be lacunar infarcts)   11/14/2019 -  Chemotherapy   The patient had palonosetron  (ALOXI) injection 0.25 mg, 0.25 mg, Intravenous,  Once, 0 of 5 cycles ado-trastuzumab emtansine (KADCYLA) 260 mg in sodium chloride 0.9 % 250 mL chemo infusion, 3.6 mg/kg = 260 mg, Intravenous, Once, 1 of 6 cycles Administration: 260 mg (11/14/2019)  for chemotherapy treatment.      CHIEF COMPLIANT: Follow-up of metastatic breast cancer, cycle 2 Kadcyla   INTERVAL HISTORY: Erin [name] is a 59 y.o. with above-mentioned history of metastaticbreast cancerwith bone metastases,and fallopian tube cancer.She is currently on treatment with Kadcyla every 3 weeks.She presents to the clinic todayfor cycle 2.  Overall she has tolerated Kadcyla extremely well.  She continues to have intermittent low back pain for which she is using Robaxin.  She does not want to take any oxycodone.  We discontinued it today.  ALLERGIES:  has No Known Allergies.  MEDICATIONS:  Current Outpatient Medications  Medication Sig Dispense Refill  . Accu-Chek FastClix Lancets MISC Use as instructed. Inject into the skin twice daily 100 each 3  . atorvastatin (LIPITOR) 40 MG tablet Take 1 tablet (40 mg total) by mouth daily. 90 tablet 1  . Blood Glucose Calibration (ACCU-CHEK GUIDE CONTROL) LIQD 1 each by In Vitro route once as needed for up to 1 dose. 1 each 0  . cholecalciferol (VITAMIN D) 1000 units tablet Take 1,000 Units by mouth daily.    . ferrous sulfate 325 (65 FE) MG tablet Take 1 tablet (325 mg total) by mouth daily before breakfast. (Patient taking differently: Take 325 mg by mouth daily before breakfast. ) 90 tablet 1  . glimepiride (AMARYL) 2 MG tablet Take 1 tablet (2 mg total) by mouth daily with breakfast. To lower blood sugar (Patient taking differently: Take 2 mg by mouth daily with breakfast. To lower blood sugar) 90 tablet 3  . glucose blood (ACCU-CHEK GUIDE) test strip Use as instructed. Check blood glucose by fingerstick twice per day. 100 each 12  . Lancet Devices (ACCU-CHEK SOFTCLIX) lancets Use as  instructed 1 each 0  . lidocaine-prilocaine (EMLA) cream Apply to affected area once 30 g 3  . lisinopril (ZESTRIL) 10 MG tablet Take 1 tablet (10 mg total) by mouth daily. To lower blood pressure (Patient taking differently: Take 10 mg by mouth daily. To lower blood pressure) 90 tablet 3  . metFORMIN (GLUCOPHAGE-XR) 500 MG 24 hr tablet TAKE 2 TABLETS BY MOUTH EVERY DAY WITH BREAKFAST 180 tablet 0  . methocarbamol (ROBAXIN) 500 MG tablet Take 2 tablets (1,000 mg total) by mouth at bedtime as needed for muscle spasms. 60 tablet 0  . ondansetron (ZOFRAN) 8 MG tablet Take 1 tablet (8 mg total) by mouth every 8 (eight) hours as needed for nausea. 30 tablet 3  . pantoprazole (PROTONIX) 20 MG tablet Take 1 tablet (20 mg total) by mouth daily. 14 tablet 0   No current facility-administered medications for this visit.    PHYSICAL EXAMINATION: ECOG PERFORMANCE STATUS: 1 - Symptomatic but completely ambulatory  Vitals:   12/05/19 1011  BP: 122/71  Pulse: 88  Resp: 18  Temp: 98.2 F (36.8 C)  SpO2: 99%   Filed Weights   12/05/19 1011  Weight: 156 lb 14.4 oz (71.2 kg)    LABORATORY DATA:  I have reviewed the data as listed CMP Latest Ref Rng &  Units 11/21/2019 11/14/2019 11/10/2019  Glucose 70 - 99 mg/dL 75 77 73  BUN 6 - 20 mg/dL _0 Creatinine 0.44 - 1.00 mg/dL 0.83 0.71 0.55  Sodium 135 - 145 mmol/L 139 143 140  Potassium 3.5 - 5.1 mmol/L 3.5 3.8 4.4  Chloride 98 - 111 mmol/L 102 107 106  CO2 22 - 32 mmol/L _1 Calcium 8.9 - 10.3 mg/dL 9.7 9.5 9.3  Total Protein 6.5 - 8.1 g/dL 8.0 7.7 -  Total Bilirubin 0.3 - 1.2 mg/dL 0.4 <0.2(L) -  Alkaline Phos 38 - 126 U/L 107 91 -  AST 15 - 41 U/L 30 15 -  ALT 0 - 44 U/L 19 18 -    Lab Results  Component Value Date   WBC 9.0 12/05/2019   HGB 10.6 (L) 12/05/2019   HCT 33.0 (L) 12/05/2019   MCV 93.0 12/05/2019   PLT 275 12/05/2019   NEUTROABS 5.9 12/05/2019    ASSESSMENT & PLAN:  Malignant neoplasm of lower-outer quadrant of  left breast of female, estrogen receptor negative (HCC) Left lumpectomy: IDC grade 3, 1.2 cm, DCIS is present, margins negative, 0/6 lymph nodes negative, ER 0%, PR 0%, HER-2 positive ratio 2.28, Ki-67 20%, T1 BN 0 stage IA S/P Taxol-Herceptin BRCA 2 Mutation Positive  10/20/19:Bone mets involving Right hip, Bil Iliac bones, T8, T9, L2 and L3, Rt prox humerus, Righ glenoid. Some lytic and some sclerotic. Mild Left supraclav adenopathy, 2 small lesions basal ganglia (could be lacunar infarcts)  Biopsy the supra clav LN: 10/31/2019: Metastatic breast cancer, ER/PR negative HER-2 equivocal by IHC, FISHpositive Treatment plan: Kadcyla every 3-week treatment. ---------------------------------------------------------------------------------------------------------------------------------------------------------- Echocardiogram 11/14/2019 Current treatment: Cycle 2 Kadcyla  Kadcyla toxicities: 1.  Nausea and vomiting: I will add Aloxi to her next chemo plan.  I sent a prescription for Zofran today. 2.  Decreased appetite 3.  Thrombocytopenia: Today's blood count 275  Patient tells me that she needs fillings for her teeth.  As long as there is no teeth extraction, she can undergo these procedures.  Monitoring closely for toxicities. Return to clinic in 3 weeks for cycle 3 We will plan for scans prior to cycle 4.    No orders of the defined types were placed in this encounter.  The patient has a good understanding of the overall plan. she agrees with it. she will call with any problems that may develop before the next visit here.  Total time spent: 30 mins including face to face time and time spent for planning, charting and coordination of care  [name], MD 12/05/2019  I, Cloyde Reams Dorshimer, am acting as scribe for [name].  I have reviewed the above documentation for accuracy and completeness, and I agree with the above.

## 2019-12-05 ENCOUNTER — Other Ambulatory Visit: Payer: Self-pay

## 2019-12-05 ENCOUNTER — Inpatient Hospital Stay: Payer: Medicaid Other

## 2019-12-05 ENCOUNTER — Inpatient Hospital Stay (HOSPITAL_BASED_OUTPATIENT_CLINIC_OR_DEPARTMENT_OTHER): Payer: Medicaid Other | Admitting: Hematology and Oncology

## 2019-12-05 DIAGNOSIS — C50512 Malignant neoplasm of lower-outer quadrant of left female breast: Secondary | ICD-10-CM

## 2019-12-05 DIAGNOSIS — Z171 Estrogen receptor negative status [ER-]: Secondary | ICD-10-CM

## 2019-12-05 DIAGNOSIS — C5701 Malignant neoplasm of right fallopian tube: Secondary | ICD-10-CM

## 2019-12-05 DIAGNOSIS — Z95828 Presence of other vascular implants and grafts: Secondary | ICD-10-CM

## 2019-12-05 DIAGNOSIS — Z5112 Encounter for antineoplastic immunotherapy: Secondary | ICD-10-CM | POA: Diagnosis not present

## 2019-12-05 DIAGNOSIS — Z7189 Other specified counseling: Secondary | ICD-10-CM

## 2019-12-05 LAB — CBC WITH DIFFERENTIAL (CANCER CENTER ONLY)
Abs Immature Granulocytes: 0.03 10*3/uL (ref 0.00–0.07)
Basophils Absolute: 0 10*3/uL (ref 0.0–0.1)
Basophils Relative: 0 %
Eosinophils Absolute: 0.1 10*3/uL (ref 0.0–0.5)
Eosinophils Relative: 1 %
HCT: 33 % — ABNORMAL LOW (ref 36.0–46.0)
Hemoglobin: 10.6 g/dL — ABNORMAL LOW (ref 12.0–15.0)
Immature Granulocytes: 0 %
Lymphocytes Relative: 26 %
Lymphs Abs: 2.3 10*3/uL (ref 0.7–4.0)
MCH: 29.9 pg (ref 26.0–34.0)
MCHC: 32.1 g/dL (ref 30.0–36.0)
MCV: 93 fL (ref 80.0–100.0)
Monocytes Absolute: 0.6 10*3/uL (ref 0.1–1.0)
Monocytes Relative: 7 %
Neutro Abs: 5.9 10*3/uL (ref 1.7–7.7)
Neutrophils Relative %: 66 %
Platelet Count: 275 10*3/uL (ref 150–400)
RBC: 3.55 MIL/uL — ABNORMAL LOW (ref 3.87–5.11)
RDW: 14 % (ref 11.5–15.5)
WBC Count: 9 10*3/uL (ref 4.0–10.5)
nRBC: 0 % (ref 0.0–0.2)

## 2019-12-05 LAB — CMP (CANCER CENTER ONLY)
ALT: 24 U/L (ref 0–44)
AST: 20 U/L (ref 15–41)
Albumin: 3.4 g/dL — ABNORMAL LOW (ref 3.5–5.0)
Alkaline Phosphatase: 93 U/L (ref 38–126)
Anion gap: 11 (ref 5–15)
BUN: 8 mg/dL (ref 6–20)
CO2: 26 mmol/L (ref 22–32)
Calcium: 9.3 mg/dL (ref 8.9–10.3)
Chloride: 105 mmol/L (ref 98–111)
Creatinine: 0.73 mg/dL (ref 0.44–1.00)
GFR, Est AFR Am: >60 mL/min (ref >60)
GFR, Estimated: >60 mL/min (ref >60)
Glucose, Bld: 85 mg/dL (ref 70–99)
Potassium: 3.7 mmol/L (ref 3.5–5.1)
Sodium: 142 mmol/L (ref 135–145)
Total Bilirubin: 0.3 mg/dL (ref 0.3–1.2)
Total Protein: 7.7 g/dL (ref 6.5–8.1)

## 2019-12-05 MED ORDER — ACETAMINOPHEN 325 MG PO TABS
650.0000 mg | ORAL_TABLET | Freq: Once | ORAL | Status: AC
  Administered 2019-12-05: 650 mg via ORAL

## 2019-12-05 MED ORDER — DIPHENHYDRAMINE HCL 25 MG PO CAPS
50.0000 mg | ORAL_CAPSULE | Freq: Once | ORAL | Status: AC
  Administered 2019-12-05: 50 mg via ORAL

## 2019-12-05 MED ORDER — DIPHENHYDRAMINE HCL 25 MG PO CAPS
ORAL_CAPSULE | ORAL | Status: AC
  Filled 2019-12-05: qty 2

## 2019-12-05 MED ORDER — SODIUM CHLORIDE 0.9 % IV SOLN
3.6000 mg/kg | Freq: Once | INTRAVENOUS | Status: AC
  Administered 2019-12-05: 260 mg via INTRAVENOUS
  Filled 2019-12-05: qty 5

## 2019-12-05 MED ORDER — PALONOSETRON HCL INJECTION 0.25 MG/5ML
INTRAVENOUS | Status: AC
  Filled 2019-12-05: qty 5

## 2019-12-05 MED ORDER — SODIUM CHLORIDE 0.9% FLUSH
10.0000 mL | INTRAVENOUS | Status: DC | PRN
  Administered 2019-12-05: 10 mL
  Filled 2019-12-05: qty 10

## 2019-12-05 MED ORDER — SODIUM CHLORIDE 0.9 % IV SOLN
Freq: Once | INTRAVENOUS | Status: AC
  Filled 2019-12-05: qty 250

## 2019-12-05 MED ORDER — PALONOSETRON HCL INJECTION 0.25 MG/5ML
0.2500 mg | Freq: Once | INTRAVENOUS | Status: AC
  Administered 2019-12-05: 0.25 mg via INTRAVENOUS

## 2019-12-05 MED ORDER — METHOCARBAMOL 500 MG PO TABS: 1000.0000 mg | ORAL_TABLET | Freq: Every evening | ORAL | 0 refills | Status: DC | PRN

## 2019-12-05 MED ORDER — HEPARIN SOD (PORK) LOCK FLUSH 100 UNIT/ML IV SOLN
500.0000 [IU] | Freq: Once | INTRAVENOUS | Status: AC | PRN
  Administered 2019-12-05: 500 [IU]
  Filled 2019-12-05: qty 5

## 2019-12-05 MED ORDER — ACETAMINOPHEN 325 MG PO TABS
ORAL_TABLET | ORAL | Status: AC
  Filled 2019-12-05: qty 2

## 2019-12-05 NOTE — Patient Instructions (Signed)
[facility] Discharge Instructions for Patients Receiving Chemotherapy  Today you received the following chemotherapy agents Kadcyla.  To help prevent nausea and vomiting after your treatment, we encourage you to take your nausea medication.   If you develop nausea and vomiting that is not controlled by your nausea medication, call the clinic.   BELOW ARE SYMPTOMS THAT SHOULD BE REPORTED IMMEDIATELY:  *FEVER GREATER THAN 100.5 F  *CHILLS WITH OR WITHOUT FEVER  NAUSEA AND VOMITING THAT IS NOT CONTROLLED WITH YOUR NAUSEA MEDICATION  *UNUSUAL SHORTNESS OF BREATH  *UNUSUAL BRUISING OR BLEEDING  TENDERNESS IN MOUTH AND THROAT WITH OR WITHOUT PRESENCE OF ULCERS  *URINARY PROBLEMS  *BOWEL PROBLEMS  UNUSUAL RASH Items with * indicate a potential emergency and should be followed up as soon as possible.  Feel free to call the clinic should you have any questions or concerns. The clinic phone number is [phone].  Please show the [place] at check-in to the Emergency Department and triage nurse.  Trastuzumab injection for infusion What is this medicine? TRASTUZUMAB (tras TOO zoo mab) is a monoclonal antibody. It is used to treat breast cancer and stomach cancer. This medicine may be used for other purposes; ask your health care provider or pharmacist if you have questions. COMMON BRAND NAME(S): Herceptin, Galvin Proffer, Trazimera What should I tell my health care provider before I take this medicine? They need to know if you have any of these conditions:  heart disease  heart failure  lung or breathing disease, like asthma  an unusual or allergic reaction to trastuzumab, benzyl alcohol, or other medications, foods, dyes, or preservatives  pregnant or trying to get pregnant  breast-feeding How should I use this medicine? This drug is given as an infusion into a vein. It is administered in a hospital or clinic by a specially  trained health care professional. Talk to your pediatrician regarding the use of this medicine in children. This medicine is not approved for use in children. Overdosage: If you think you have taken too much of this medicine contact a poison control center or emergency room at once. NOTE: This medicine is only for you. Do not share this medicine with others. What if I miss a dose? It is important not to miss a dose. Call your doctor or health care professional if you are unable to keep an appointment. What may interact with this medicine? This medicine may interact with the following medications:  certain types of chemotherapy, such as daunorubicin, doxorubicin, epirubicin, and idarubicin This list may not describe all possible interactions. Give your health care provider a list of all the medicines, herbs, non-prescription drugs, or dietary supplements you use. Also tell them if you smoke, drink alcohol, or use illegal drugs. Some items may interact with your medicine. What should I watch for while using this medicine? Visit your doctor for checks on your progress. Report any side effects. Continue your course of treatment even though you feel ill unless your doctor tells you to stop. Call your doctor or health care professional for advice if you get a fever, chills or sore throat, or other symptoms of a cold or flu. Do not treat yourself. Try to avoid being around people who are sick. You may experience fever, chills and shaking during your first infusion. These effects are usually mild and can be treated with other medicines. Report any side effects during the infusion to your health care professional. Fever and chills usually do  not happen with later infusions. Do not become pregnant while taking this medicine or for 7 months after stopping it. Women should inform their doctor if they wish to become pregnant or think they might be pregnant. Women of child-bearing potential will need to have a  negative pregnancy test before starting this medicine. There is a potential for serious side effects to an unborn child. Talk to your health care professional or pharmacist for more information. Do not breast-feed an infant while taking this medicine or for 7 months after stopping it. Women must use effective birth control with this medicine. What side effects may I notice from receiving this medicine? Side effects that you should report to your doctor or health care professional as soon as possible:  allergic reactions like skin rash, itching or hives, swelling of the face, lips, or tongue  chest pain or palpitations  cough  dizziness  feeling faint or lightheaded, falls  fever  general ill feeling or flu-like symptoms  signs of worsening heart failure like breathing problems; swelling in your legs and feet  unusually weak or tired Side effects that usually do not require medical attention (report to your doctor or health care professional if they continue or are bothersome):  bone pain  changes in taste  diarrhea  joint pain  nausea/vomiting  weight loss This list may not describe all possible side effects. Call your doctor for medical advice about side effects. You may report side effects to FDA at 1-800-FDA-1088. Where should I keep my medicine? This drug is given in a hospital or clinic and will not be stored at home. NOTE: This sheet is a summary. It may not cover all possible information. If you have questions about this medicine, talk to your doctor, pharmacist, or health care provider.  2020 Elsevier/Gold Standard (2016-08-25 14:37:52)

## 2019-12-05 NOTE — Assessment & Plan Note (Signed)
Left lumpectomy: IDC grade 3, 1.2 cm, DCIS is present, margins negative, 0/6 lymph nodes negative, ER 0%, PR 0%, HER-2 positive ratio 2.28, Ki-67 20%, T1 BN 0 stage IA S/P Taxol-Herceptin BRCA 2 Mutation Positive  10/20/19:Bone mets involving Right hip, Bil Iliac bones, T8, T9, L2 and L3, Rt prox humerus, Righ glenoid. Some lytic and some sclerotic. Mild Left supraclav adenopathy, 2 small lesions basal ganglia (could be lacunar infarcts)  Biopsy the supra clav LN: 10/31/2019: Metastatic breast cancer, ER/PR negative HER-2 equivocal by IHC, FISHpositive Treatment plan: Kadcyla every 3-week treatment. ---------------------------------------------------------------------------------------------------------------------------------------------------------- Echocardiogram 11/14/2019 Current treatment: Cycle 2 Kadcyla Kadcyla toxicities: 1.  Nausea and vomiting: I will add Aloxi to her next chemo plan.  I sent a prescription for Zofran today. 2.  Decreased appetite 3.  Thrombocytopenia: Today's blood count 98. Monitoring closely for toxicities. Return to clinic in 3 weeks for cycle 3 We will plan for scans prior to cycle 4.

## 2019-12-06 LAB — CA 125: Cancer Antigen (CA) 125: 14436 U/mL — ABNORMAL HIGH (ref 0.0–38.1)

## 2019-12-07 ENCOUNTER — Telehealth: Payer: Self-pay | Admitting: Hematology and Oncology

## 2019-12-07 NOTE — Telephone Encounter (Signed)
Scheduled per los, patient has been called and notified of upcoming appointments. 

## 2019-12-19 ENCOUNTER — Other Ambulatory Visit: Payer: Self-pay | Admitting: Family Medicine

## 2019-12-19 DIAGNOSIS — D509 Iron deficiency anemia, unspecified: Secondary | ICD-10-CM

## 2019-12-20 NOTE — Progress Notes (Signed)
Pharmacist Chemotherapy Monitoring - Follow Up Assessment    I verify that I have reviewed each item in the below checklist:  . Regimen for the patient is scheduled for the appropriate day and plan matches scheduled date. Marland Kitchen Appropriate non-routine labs are ordered dependent on drug ordered. . If applicable, additional medications reviewed and ordered per protocol based on lifetime cumulative doses and/or treatment regimen.   Plan for follow-up and/or issues identified: No . I-vent associated with next due treatment: No . MD and/or nursing notified: No  Tobie Hellen D 12/20/2019 4:26 PM

## 2019-12-25 NOTE — Progress Notes (Signed)
Patient Care Team: Antony Blackbird, MD as PCP - General (Family Medicine) Excell Seltzer, MD (Inactive) as Consulting Physician (General Surgery) [name], MD as Consulting Physician (Hematology and Oncology) Kyung Rudd, MD as Consulting Physician (Radiation Oncology) [name], MD as Consulting Physician (Gynecologic Oncology) [name], NP as Nurse Practitioner (Hematology and Oncology) Mauro Kaufmann, RN as Oncology Nurse Navigator Rockwell Germany, RN as Oncology Nurse Navigator  DIAGNOSIS:    ICD-10-CM   1. Malignant neoplasm of lower-outer quadrant of left breast of female, estrogen receptor negative ([place])  C50.512    Z17.1     SUMMARY OF ONCOLOGIC HISTORY: Oncology History  Malignant neoplasm of lower-outer quadrant of left breast of female, estrogen receptor negative ([place])  05/11/2017 Initial Diagnosis   Left breast biopsy 3:30 position 6 cm from nipple: IDC with DCIS, lymphovascular invasion present, grade 2-3, ER 0%, PR 0%, HER-2 positive ratio 2.28, Ki-67 20%, 1 cm lesion left breast, T1b N0 stage IA clinical stage   06/09/2017 Surgery   Left lumpectomy: IDC grade 3, 1.2 cm, DCIS is present, margins negative, 0/6 lymph nodes negative, ER 0%, PR 0%, HER-2 positive ratio 2.28, Ki-67 20%, T1 BN 0 stage IA   07/09/2017 - 09/29/2017 Adjuvant Chemotherapy   Taxol/Herceptin x 12 completed on 09/29/2017, went on to receive every 3 week adjuvant Herceptin through 06/28/2018   11/25/2017 - 01/07/2018 Radiation Therapy    The patient initially received a dose of 50.4 Gy in 28 fractions to the breast using whole-breast tangent fields. This was delivered using a 3-D conformal technique. The patient then received a boost to the seroma. This delivered an additional 10 Gy in 5 fractions using a 3-D technique. The total dose was 60.4 Gy.   02/15/2018 Genetic Testing   The Common Hereditary Cancer Panel offered by [facility] includes sequencing and/or deletion duplication  testing of the following 47 genes: APC, ATM, AXIN2, BARD1, BMPR1A, BRCA1, BRCA2, BRIP1, CDH1, CDKN2A (p14ARF), CDKN2A (p16INK4a), CKD4, CHEK2, CTNNA1, DICER1, EPCAM (Deletion/duplication testing only), GREM1 (promoter region deletion/duplication testing only), KIT, MEN1, MLH1, MSH2, MSH3, MSH6, MUTYH, NBN, NF1, NHTL1, PALB2, PDGFRA, PMS2, POLD1, POLE, PTEN, RAD50, RAD51C, RAD51D, SDHB, SDHC, SDHD, SMAD4, SMARCA4. STK11, TP53, TSC1, TSC2, and VHL.  The following genes were evaluated for sequence changes only: SDHA and HOXB13 c.251G>A variant only.   Results: POSITIVE. 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 The date of this test report is 02/15/2018   10/20/2019 PET scan   Bone mets involving Right hip, Bil Iliac bones, T8, T9, L2 and L3, Rt prox humerus, Righ glenoid. Some lytic and some sclerotic. Mild Left supraclav adenopathy, 2 small lesions basal ganglia (could be lacunar infarcts)   11/14/2019 -  Chemotherapy   The patient had palonosetron (ALOXI) injection 0.25 mg, 0.25 mg, Intravenous,  Once, 1 of 5 cycles Administration: 0.25 mg (12/05/2019) ado-trastuzumab emtansine (KADCYLA) 260 mg in sodium chloride 0.9 % 250 mL chemo infusion, 3.6 mg/kg = 260 mg, Intravenous, Once, 2 of 6 cycles Administration: 260 mg (11/14/2019), 260 mg (12/05/2019)  for chemotherapy treatment.    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 Some lytic and some sclerotic. Mild Left supraclav adenopathy, 2 small lesions basal ganglia (could be lacunar infarcts)    11/14/2019 -  Chemotherapy   The patient had palonosetron (ALOXI) injection 0.25 mg, 0.25 mg, Intravenous,  Once, 1 of 5 cycles Administration: 0.25 mg (12/05/2019) ado-trastuzumab emtansine (KADCYLA) 260 mg in sodium chloride 0.9 % 250 mL chemo infusion, 3.6 mg/kg = 260 mg, Intravenous, Once, 2 of 6 cycles Administration: 260 mg (11/14/2019), 260 mg (12/05/2019)  for chemotherapy treatment.      CHIEF COMPLIANT: Follow-up ofmetastatic breast cancer, cycle 3Kadcyla   INTERVAL HISTORY: Erin [name] is a 59 y.o. with above-mentioned history of metastaticbreast cancerwith bone metastases,and fallopian tube cancer.She is currently on treatment with Kadcyla every 3 weeks.She presents to the clinic todayforcycle 3.  She is complaining of severe pain in the right lower back and right hip.  She has had L2-L3 and right hip metastatic sites.  The other concerning finding is the rapid increase in the CA-125 level.  She also reports left sided neck and shoulder discomfort.  She tells me because of these aches and pains she has not been able to sleep well at night.  She is not taking anything for pain.  Otherwise she is tolerating Kadcyla extremely well.  ALLERGIES:  has No Known Allergies.  MEDICATIONS:  Current Outpatient Medications  Medication Sig Dispense Refill  . Accu-Chek FastClix Lancets MISC Use as instructed. Inject into the skin twice daily 100 each 3  . atorvastatin (LIPITOR) 40 MG tablet Take 1 tablet (40 mg total) by mouth daily. 90 tablet 1  . Blood Glucose Calibration (ACCU-CHEK GUIDE CONTROL) LIQD 1 each by In Vitro route once as needed for up to 1 dose. 1 each 0  . cholecalciferol (VITAMIN D) 1000 units tablet Take 1,000 Units by mouth daily.    . ferrous sulfate 325 (65 FE) MG tablet TAKE 1 TABLET (325 MG TOTAL) BY MOUTH DAILY BEFORE BREAKFAST. 90 tablet 1  . glimepiride (AMARYL) 2 MG tablet Take 1 tablet (2 mg total) by mouth daily with breakfast. To lower blood sugar (Patient  taking differently: Take 2 mg by mouth daily with breakfast. To lower blood sugar) 90 tablet 3  . glucose blood (ACCU-CHEK GUIDE) test strip Use as instructed. Check blood glucose by fingerstick twice per day. 100 each 12  . Lancet Devices (ACCU-CHEK SOFTCLIX) lancets Use as instructed 1 each 0  . lidocaine-prilocaine (EMLA) cream Apply to affected area once 30 g 3  . lisinopril (ZESTRIL) 10 MG tablet Take 1 tablet (10 mg total) by mouth daily. To lower blood pressure (Patient taking differently: Take 10 mg by mouth daily. To lower blood pressure) 90 tablet 3  . metFORMIN (GLUCOPHAGE-XR) 500 MG 24 hr tablet TAKE 2 TABLETS BY MOUTH EVERY DAY WITH BREAKFAST 180 tablet 0  . methocarbamol (ROBAXIN) 500 MG tablet Take 2 tablets (1,000 mg total) by mouth at bedtime as needed for muscle spasms. 60 tablet 0  . ondansetron (ZOFRAN) 8 MG tablet Take 1 tablet (8 mg total) by mouth every 8 (eight) hours as needed for nausea. 30 tablet 3  . pantoprazole (PROTONIX) 20 MG tablet Take 1 tablet (20 mg total) by mouth daily. 14 tablet 0   No current facility-administered medications for this visit.    PHYSICAL EXAMINATION: ECOG PERFORMANCE STATUS: 1 - Symptomatic but completely ambulatory  Vitals:   12/26/19 1009  BP: 114/64  Pulse:   92  Resp: 17  Temp: 98.5 F (36.9 C)  SpO2: 100%   Filed Weights   12/26/19 1009  Weight: 152 lb 11.2 oz (69.3 kg)    LABORATORY DATA:  I have reviewed the data as listed CMP Latest Ref Rng & Units 12/05/2019 11/21/2019 11/14/2019  Glucose 70 - 99 mg/dL 85 75 77  BUN 6 - 20 mg/dL _0 Creatinine 0.44 - 1.00 mg/dL 0.73 0.83 0.71  Sodium 135 - 145 mmol/L 142 139 143  Potassium 3.5 - 5.1 mmol/L 3.7 3.5 3.8  Chloride 98 - 111 mmol/L 105 102 107  CO2 22 - 32 mmol/L _1 Calcium 8.9 - 10.3 mg/dL 9.3 9.7 9.5  Total Protein 6.5 - 8.1 g/dL 7.7 8.0 7.7  Total Bilirubin 0.3 - 1.2 mg/dL 0.3 0.4 <0.2(L)  Alkaline Phos 38 - 126 U/L 93 107 91  AST 15 - 41 U/L _2 ALT 0 - 44 U/L _3 Lab Results  Component Value Date   WBC 11.6 (H) 12/26/2019   HGB 10.6 (L) 12/26/2019   HCT 33.7 (L) 12/26/2019   MCV 93.1 12/26/2019   PLT 313 12/26/2019   NEUTROABS 7.7 12/26/2019    ASSESSMENT & PLAN:  Malignant neoplasm of lower-outer quadrant of left breast of female, estrogen receptor negative ([facility]) Left lumpectomy: IDC grade 3, 1.2 cm, DCIS is present, margins negative, 0/6 lymph nodes negative, ER 0%, PR 0%, HER-2 positive ratio 2.28, Ki-67 20%, T1 BN 0 stage IA S/P Taxol-Herceptin BRCA 2 Mutation Positive  10/20/19:Bone mets involving Right hip, Bil Iliac bones, T8, T9, L2 and L3, Rt prox humerus, Righ glenoid. Some lytic and some sclerotic. Mild Left supraclav adenopathy, 2 small lesions basal ganglia (could be lacunar infarcts)  Biopsy the supra clav LN: 10/31/2019: Metastatic breast cancer, ER/PR negative HER-2 equivocal by IHC, FISHpositive Treatment plan: Kadcyla every 3-week treatment. ---------------------------------------------------------------------------------------------------------------------------------------------------------- Echocardiogram 11/14/2019 Current treatment: Cycle 3Kadcyla  Kadcyla toxicities: 1.Nausea and vomiting: I will add Aloxi to her next chemo plan. I sent a prescription for Zofran today. 2.Decreased appetite 3.Thrombocytopenia: Today's blood count 275  Severe pain in the low back and right hip: I sent a prescription for Percocet.  We will obtain MRI of the lower back and right hip.  We will also obtain CT chest abdomen pelvis for restaging.  I will request radiation oncology to evaluate her for palliative radiation.  Rapid increase in Ca1 25: I am very concerned about progression of disease.  We may have to switch her treatment to a PARP inhibitor, if there is clear progression of disease.  I would like to see her after the scans to go over the results.  No orders of the defined types were placed  in this encounter.  The patient has a good understanding of the overall plan. she agrees with it. she will call with any problems that may develop before the next visit here.  Total time spent: 30 mins including face to face time and time spent for planning, charting and coordination of care  [name], MD 12/26/2019  I, Cloyde Reams Dorshimer, am acting as scribe for [name].  I have reviewed the above documentation for accuracy and completeness, and I agree with the above.

## 2019-12-26 ENCOUNTER — Inpatient Hospital Stay (HOSPITAL_BASED_OUTPATIENT_CLINIC_OR_DEPARTMENT_OTHER): Payer: Medicaid Other | Admitting: Hematology and Oncology

## 2019-12-26 ENCOUNTER — Ambulatory Visit (HOSPITAL_BASED_OUTPATIENT_CLINIC_OR_DEPARTMENT_OTHER): Payer: Medicaid Other | Admitting: Medical

## 2019-12-26 ENCOUNTER — Inpatient Hospital Stay: Payer: Medicaid Other | Attending: Gynecologic Oncology

## 2019-12-26 ENCOUNTER — Inpatient Hospital Stay: Payer: Medicaid Other

## 2019-12-26 ENCOUNTER — Encounter: Payer: Self-pay | Admitting: *Deleted

## 2019-12-26 ENCOUNTER — Telehealth: Payer: Self-pay

## 2019-12-26 ENCOUNTER — Other Ambulatory Visit: Payer: Self-pay

## 2019-12-26 VITALS — BP 118/70 | HR 89

## 2019-12-26 VITALS — BP 114/64 | HR 92 | Temp 98.5°F | Resp 17 | Ht 61.0 in | Wt 152.7 lb

## 2019-12-26 DIAGNOSIS — Z95828 Presence of other vascular implants and grafts: Secondary | ICD-10-CM

## 2019-12-26 DIAGNOSIS — Z171 Estrogen receptor negative status [ER-]: Secondary | ICD-10-CM | POA: Insufficient documentation

## 2019-12-26 DIAGNOSIS — Z9221 Personal history of antineoplastic chemotherapy: Secondary | ICD-10-CM | POA: Diagnosis not present

## 2019-12-26 DIAGNOSIS — C5701 Malignant neoplasm of right fallopian tube: Secondary | ICD-10-CM

## 2019-12-26 DIAGNOSIS — C5700 Malignant neoplasm of unspecified fallopian tube: Secondary | ICD-10-CM | POA: Diagnosis not present

## 2019-12-26 DIAGNOSIS — R112 Nausea with vomiting, unspecified: Secondary | ICD-10-CM | POA: Insufficient documentation

## 2019-12-26 DIAGNOSIS — Z923 Personal history of irradiation: Secondary | ICD-10-CM | POA: Diagnosis not present

## 2019-12-26 DIAGNOSIS — D696 Thrombocytopenia, unspecified: Secondary | ICD-10-CM | POA: Insufficient documentation

## 2019-12-26 DIAGNOSIS — C50919 Malignant neoplasm of unspecified site of unspecified female breast: Secondary | ICD-10-CM

## 2019-12-26 DIAGNOSIS — C50512 Malignant neoplasm of lower-outer quadrant of left female breast: Secondary | ICD-10-CM

## 2019-12-26 DIAGNOSIS — C7951 Secondary malignant neoplasm of bone: Secondary | ICD-10-CM | POA: Insufficient documentation

## 2019-12-26 DIAGNOSIS — Z7189 Other specified counseling: Secondary | ICD-10-CM

## 2019-12-26 DIAGNOSIS — Z5112 Encounter for antineoplastic immunotherapy: Secondary | ICD-10-CM | POA: Diagnosis not present

## 2019-12-26 DIAGNOSIS — Z79899 Other long term (current) drug therapy: Secondary | ICD-10-CM | POA: Insufficient documentation

## 2019-12-26 DIAGNOSIS — Z7984 Long term (current) use of oral hypoglycemic drugs: Secondary | ICD-10-CM | POA: Diagnosis not present

## 2019-12-26 LAB — CMP (CANCER CENTER ONLY)
ALT: 26 U/L (ref 0–44)
AST: 25 U/L (ref 15–41)
Albumin: 3.3 g/dL — ABNORMAL LOW (ref 3.5–5.0)
Alkaline Phosphatase: 102 U/L (ref 38–126)
Anion gap: 12 (ref 5–15)
BUN: 9 mg/dL (ref 6–20)
CO2: 24 mmol/L (ref 22–32)
Calcium: 10 mg/dL (ref 8.9–10.3)
Chloride: 101 mmol/L (ref 98–111)
Creatinine: 0.74 mg/dL (ref 0.44–1.00)
GFR, Est AFR Am: >60 mL/min (ref >60)
GFR, Estimated: >60 mL/min (ref >60)
Glucose, Bld: 73 mg/dL (ref 70–99)
Potassium: 3.9 mmol/L (ref 3.5–5.1)
Sodium: 137 mmol/L (ref 135–145)
Total Bilirubin: 0.3 mg/dL (ref 0.3–1.2)
Total Protein: 8.6 g/dL — ABNORMAL HIGH (ref 6.5–8.1)

## 2019-12-26 LAB — CBC WITH DIFFERENTIAL (CANCER CENTER ONLY)
Abs Immature Granulocytes: 0.06 10*3/uL (ref 0.00–0.07)
Basophils Absolute: 0 10*3/uL (ref 0.0–0.1)
Basophils Relative: 0 %
Eosinophils Absolute: 0 10*3/uL (ref 0.0–0.5)
Eosinophils Relative: 0 %
HCT: 33.7 % — ABNORMAL LOW (ref 36.0–46.0)
Hemoglobin: 10.6 g/dL — ABNORMAL LOW (ref 12.0–15.0)
Immature Granulocytes: 1 %
Lymphocytes Relative: 27 %
Lymphs Abs: 3.1 10*3/uL (ref 0.7–4.0)
MCH: 29.3 pg (ref 26.0–34.0)
MCHC: 31.5 g/dL (ref 30.0–36.0)
MCV: 93.1 fL (ref 80.0–100.0)
Monocytes Absolute: 0.7 10*3/uL (ref 0.1–1.0)
Monocytes Relative: 6 %
Neutro Abs: 7.7 10*3/uL (ref 1.7–7.7)
Neutrophils Relative %: 66 %
Platelet Count: 313 10*3/uL (ref 150–400)
RBC: 3.62 MIL/uL — ABNORMAL LOW (ref 3.87–5.11)
RDW: 14.4 % (ref 11.5–15.5)
WBC Count: 11.6 10*3/uL — ABNORMAL HIGH (ref 4.0–10.5)
nRBC: 0 % (ref 0.0–0.2)

## 2019-12-26 MED ORDER — SODIUM CHLORIDE 0.9% FLUSH
10.0000 mL | INTRAVENOUS | Status: DC | PRN
  Administered 2019-12-26: 10 mL
  Filled 2019-12-26: qty 10

## 2019-12-26 MED ORDER — ACETAMINOPHEN 325 MG PO TABS
650.0000 mg | ORAL_TABLET | Freq: Once | ORAL | Status: AC
  Administered 2019-12-26: 650 mg via ORAL

## 2019-12-26 MED ORDER — DIPHENHYDRAMINE HCL 25 MG PO CAPS
ORAL_CAPSULE | ORAL | Status: AC
  Filled 2019-12-26: qty 2

## 2019-12-26 MED ORDER — HEPARIN SOD (PORK) LOCK FLUSH 100 UNIT/ML IV SOLN
500.0000 [IU] | Freq: Once | INTRAVENOUS | Status: AC | PRN
  Administered 2019-12-26: 500 [IU]
  Filled 2019-12-26: qty 5

## 2019-12-26 MED ORDER — PALONOSETRON HCL INJECTION 0.25 MG/5ML
INTRAVENOUS | Status: AC
  Filled 2019-12-26: qty 5

## 2019-12-26 MED ORDER — SODIUM CHLORIDE 0.9 % IV SOLN
Freq: Once | INTRAVENOUS | Status: AC
  Filled 2019-12-26: qty 250

## 2019-12-26 MED ORDER — SODIUM CHLORIDE 0.9% FLUSH
10.0000 mL | INTRAVENOUS | Status: DC | PRN
  Administered 2019-12-26: 10:00:00 10 mL
  Filled 2019-12-26: qty 10

## 2019-12-26 MED ORDER — DOCUSATE SODIUM 100 MG PO CAPS: 100.0000 mg | ORAL_CAPSULE | Freq: Two times a day (BID) | ORAL | 3 refills | Status: DC

## 2019-12-26 MED ORDER — ACETAMINOPHEN 325 MG PO TABS
ORAL_TABLET | ORAL | Status: AC
  Filled 2019-12-26: qty 2

## 2019-12-26 MED ORDER — DIPHENHYDRAMINE HCL 25 MG PO CAPS
50.0000 mg | ORAL_CAPSULE | Freq: Once | ORAL | Status: AC
  Administered 2019-12-26: 50 mg via ORAL

## 2019-12-26 MED ORDER — PALONOSETRON HCL INJECTION 0.25 MG/5ML
0.2500 mg | Freq: Once | INTRAVENOUS | Status: AC
  Administered 2019-12-26: 0.25 mg via INTRAVENOUS

## 2019-12-26 MED ORDER — OXYCODONE-ACETAMINOPHEN 5-325 MG PO TABS: 1.0000 | ORAL_TABLET | Freq: Three times a day (TID) | ORAL | 0 refills | Status: DC | PRN

## 2019-12-26 MED ORDER — SODIUM CHLORIDE 0.9 % IV SOLN
3.6000 mg/kg | Freq: Once | INTRAVENOUS | Status: AC
  Administered 2019-12-26: 260 mg via INTRAVENOUS
  Filled 2019-12-26: qty 8

## 2019-12-26 NOTE — Progress Notes (Signed)
The patient was seen in the flush room today as she was having her port accessed.  It was noted that she had a small pinpoint opening superior to the middle of her incision which was otherwise well-healed.  There was no increased erythema or exudate.  The patient was instructed through her final language interpreter to watch the area and to return should she develop fevers, shaking chills, erythema, increased warmth, or exudate from the area.  Sandi Mealy, MHS, PA-C Physician Assistant

## 2019-12-26 NOTE — Assessment & Plan Note (Signed)
Left lumpectomy: IDC grade 3, 1.2 cm, DCIS is present, margins negative, 0/6 lymph nodes negative, ER 0%, PR 0%, HER-2 positive ratio 2.28, Ki-67 20%, T1 BN 0 stage IA S/P Taxol-Herceptin BRCA 2 Mutation Positive  10/20/19:Bone mets involving Right hip, Bil Iliac bones, T8, T9, L2 and L3, Rt prox humerus, Righ glenoid. Some lytic and some sclerotic. Mild Left supraclav adenopathy, 2 small lesions basal ganglia (could be lacunar infarcts)  Biopsy the supra clav LN: 10/31/2019: Metastatic breast cancer, ER/PR negative HER-2 equivocal by IHC, FISHpositive Treatment plan: Kadcyla every 3-week treatment. ---------------------------------------------------------------------------------------------------------------------------------------------------------- Echocardiogram 11/14/2019 Current treatment: Cycle 3Kadcyla  Kadcyla toxicities: 1.Nausea and vomiting: I will add Aloxi to her next chemo plan. I sent a prescription for Zofran today. 2.Decreased appetite 3.Thrombocytopenia: Today's blood count 275  Patient tells me that she needs fillings for her teeth.  As long as there is no teeth extraction, she can undergo these procedures.  Monitoring closely for toxicities. Return to clinic in 3 weeks for cycle 4 We will plan for scans prior to cycle 4. 

## 2019-12-26 NOTE — Patient Instructions (Signed)

## 2019-12-26 NOTE — Telephone Encounter (Signed)
Pt notified of imaging appointments, and directions.  Pt notified to pick up oral contrast.  F/U MD apt made.

## 2019-12-26 NOTE — Patient Instructions (Signed)
[facility] Discharge Instructions for Patients Receiving Chemotherapy  Today you received the following chemotherapy agents: ado-trastuzumab emtansine.  To help prevent nausea and vomiting after your treatment, we encourage you to take your nausea medication as directed.   If you develop nausea and vomiting that is not controlled by your nausea medication, call the clinic.   BELOW ARE SYMPTOMS THAT SHOULD BE REPORTED IMMEDIATELY:  *FEVER GREATER THAN 100.5 F  *CHILLS WITH OR WITHOUT FEVER  NAUSEA AND VOMITING THAT IS NOT CONTROLLED WITH YOUR NAUSEA MEDICATION  *UNUSUAL SHORTNESS OF BREATH  *UNUSUAL BRUISING OR BLEEDING  TENDERNESS IN MOUTH AND THROAT WITH OR WITHOUT PRESENCE OF ULCERS  *URINARY PROBLEMS  *BOWEL PROBLEMS  UNUSUAL RASH Items with * indicate a potential emergency and should be followed up as soon as possible.  Feel free to call the clinic should you have any questions or concerns. The clinic phone number is [phone].  Please show the CHEMO ALERT CARD at check-in to the Emergency Department and triage nurse.   

## 2019-12-26 NOTE — Progress Notes (Signed)
Patient had an open spot on her incision line. Sandi Mealy, PA came and looked and gave the ok to proceed with accessing. Made patient aware of signs of infection and to make sure she called [place] if anything changed.

## 2019-12-28 ENCOUNTER — Other Ambulatory Visit: Payer: Self-pay

## 2019-12-28 ENCOUNTER — Ambulatory Visit
Admission: RE | Admit: 2019-12-28 | Discharge: 2019-12-28 | Disposition: A | Discharge status: Home / Self Care | Payer: Medicaid Other | Source: Ambulatory Visit | Attending: Radiation Oncology | Admitting: Radiation Oncology

## 2019-12-28 ENCOUNTER — Telehealth: Payer: Self-pay | Admitting: Hematology and Oncology

## 2019-12-28 VITALS — BP 119/65 | HR 98 | Temp 98.3°F | Resp 18 | Ht 61.0 in | Wt 149.0 lb

## 2019-12-28 DIAGNOSIS — C50912 Malignant neoplasm of unspecified site of left female breast: Secondary | ICD-10-CM | POA: Diagnosis present

## 2019-12-28 DIAGNOSIS — G893 Neoplasm related pain (acute) (chronic): Secondary | ICD-10-CM | POA: Insufficient documentation

## 2019-12-28 DIAGNOSIS — Z171 Estrogen receptor negative status [ER-]: Secondary | ICD-10-CM | POA: Diagnosis not present

## 2019-12-28 DIAGNOSIS — Z803 Family history of malignant neoplasm of breast: Secondary | ICD-10-CM | POA: Diagnosis not present

## 2019-12-28 DIAGNOSIS — C7951 Secondary malignant neoplasm of bone: Secondary | ICD-10-CM | POA: Insufficient documentation

## 2019-12-28 DIAGNOSIS — Z923 Personal history of irradiation: Secondary | ICD-10-CM | POA: Diagnosis not present

## 2019-12-28 DIAGNOSIS — C50512 Malignant neoplasm of lower-outer quadrant of left female breast: Secondary | ICD-10-CM | POA: Diagnosis not present

## 2019-12-28 DIAGNOSIS — Z510 Encounter for antineoplastic radiation therapy: Secondary | ICD-10-CM | POA: Insufficient documentation

## 2019-12-28 NOTE — Telephone Encounter (Signed)
Cancelled 04/19 appointment due to provider being out of office, left a voicemail with interpreter for patient to follow-up on 04/26.

## 2019-12-28 NOTE — Progress Notes (Addendum)
Radiation Oncology         [phone] ________________________________  Name: Erin [name] MRN: [id number]  Date of Service: 12/28/2019  DOB: November 08, 1960  Post Treatment Note  CC: Antony Blackbird, MD  [name], MD  Diagnosis:  Stage IA, pT1cN0M0 grade 3, ER/PR negative, HER2 amplified, invasive ductal carcinoma with DCIS of the left breast.  Interval Since Last Radiation: 2 years   11/25/2017 - 01/10/2018: The patient initially received a dose of 50.4 Gy in 28 fractions to the breast using whole-breast tangent fields. This was delivered using a 3-D conformal technique. The patient then received a boost to the seroma. This delivered an additional 10 Gy in 5 fractions using a 3-D technique. The total dose was 60.4 Gy.   Narrative: In summary this is a very pleasant 59 year old patient with HER-2 amplified left breast cancer.  She was originally diagnosed about 2-1/2 years ago, and completed lumpectomy and adjuvant radiotherapy following adjuvant chemotherapy.  She completed a course of anti-HER-2 therapy, and has been followed by Dr. [name] regularly.  She had been doing well until February 2021 when she was found on pet imaging to have metastases in the bones involving the right hip, bilateral iliac bones, mid thoracic and upper lumbar spine, humerus, right glenoid region and increasing adenopathy.  She was started on systemic Kadcyla.  Given her pain in the hip and low back, she is seen today to discuss treatment recommendations with palliative radiotherapy as she continues on with Kadcyla.  Dr. Mariann Laster is also planning more definitive imaging with MRI of the spine and CT abdomen pelvis.  These are scheduled for CT tomorrow and MRI next Friday.   On review of systems, the patient reports that she is doing okay, she states that oxycodone is helping somewhat with her pain but she describes pain in the low back, as well as upper right hip, and bilateral posterior iliac region.  She describes  normal sensory function as well as motor function, but she is having constipation and reports that she has had a little bit of numbness and tingling in her vaginal area though has not lost control of her bowel or bladder.  She reports that she is not having any chest pain, fevers or chills.  She denies any bladder dysfunction, and is taking medication to try and move her bowels.  She describes a little bit of bleeding from her vaginal area that she thinks is the result of her constipation and straining.  No other complaints are verbalized.    PAST MEDICAL HISTORY:  Past Medical History:  Diagnosis Date  . Arthritis    knees, elbows  . Borderline glaucoma of right eye   . BRCA2 gene mutation positive 02/17/2018   BRCA2 c.4552del (p.[id number]*25)  Result reported out on 02/15/2018.   . Breast cancer ([facility]) 05/2017   left  . Chronic back pain   . Deaf    per pt born hearing and at age 35 1/2 lost hearing , was told by mother unknown cause, can miminally hear in left and no hearing on right   . Depression   . Dyspnea    states some SOB with ADLs and anemia   . Elevated cancer antigen 125 (CA 125)   . Family history of breast cancer   . Family history of breast cancer   . Family history of colon cancer   . Genetic testing 02/17/2018   The Common Hereditary Cancer Panel offered by [facility] includes sequencing and/or deletion  duplication testing of the following 47 genes: APC, ATM, AXIN2, BARD1, BMPR1A, BRCA1, BRCA2, BRIP1, CDH1, CDKN2A (p14ARF), CDKN2A (p16INK4a), CKD4, CHEK2, CTNNA1, DICER1, EPCAM (Deletion/duplication testing only), GREM1 (promoter region deletion/duplication testing only), KIT, MEN1, MLH1, MSH2, MSH3, MSH6, MU  . History of cancer chemotherapy    left breast cancer 09-08-2017 to 10-09-2017;  fallopion tube cancer  12-15-2017  to 06-28-2018  . 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 History of external beam radiation therapy    left breast  11-25-2017  to 01-07-2018  . Hyperlipidemia   . Hypertension    followed by pcp   (10-05-2019  per pt never had stress test)  . IDA (iron deficiency anemia)   . Malignant neoplasm of lower-outer quadrant of left breast of female, estrogen receptor negative [facility]) oncologist-- dr [name]   dx 08/ 2018--- Stage IA, DCIS,  ER/ PR negative,  HER-2 positive;  06-09-2017 s/p left breast lumpectomy with node dissection;  completed chemo 10-09-2017  and radiation 01-07-2018/  hercepton completed 06-28-2018  . Non-insulin dependent type 2 diabetes mellitus ([place])    followed by pcp   (10-05-2019 per pt check cbg every other day in AM,  fasting cbg-- 105)  . Numbness of right thumb   . Wears glasses     PAST SURGICAL HISTORY: Past Surgical History:  Procedure Laterality Date  . BREAST LUMPECTOMY WITH RADIOACTIVE SEED AND SENTINEL LYMPH NODE BIOPSY Left 06/09/2017   Procedure: BREAST LUMPECTOMY WITH RADIOACTIVE SEED AND SENTINEL LYMPH NODE BIOPSY;  Surgeon: Excell Seltzer, MD;  Location: [place];  Service: General;  Laterality: Left;  . COLONOSCOPY  11/02/2017   polyps  . HERNIA REPAIR    . HIATAL HERNIA REPAIR  09/2019  . LAPAROSCOPIC ASSISTED VAGINAL HYSTERECTOMY N/A 11/04/2017   Procedure: LAPAROSCOPIC ASSISTED VAGINAL HYSTERECTOMY;  Surgeon: [name], MD;  Location: [facility];  Service: Gynecology;  Laterality: N/A;  . LAPAROSCOPIC BILATERAL SALPINGO OOPHERECTOMY Bilateral 11/04/2017   Procedure: LAPAROSCOPIC BILATERAL SALPINGO OOPHORECTOMY;  Surgeon: [name], MD;  Location: [facility];  Service: Gynecology;  Laterality: Bilateral;  . LAPAROSCOPY N/A 10/09/2019   Procedure: LAPAROSCOPY DIAGNOSTIC WITH PERITONEAL BIOPSIES;  Surgeon: [name], MD;  Location: [facility];  Service: Gynecology;  Laterality: N/A;  . PORTACATH  PLACEMENT Right 06/09/2017   Procedure: INSERTION PORT-A-CATH WITH [place];  Surgeon: Excell Seltzer, MD;  Location: [place];  Service: General;  Laterality: Right;  . PORTACATH PLACEMENT Right 11/13/2019   Procedure: INSERTION PORT-A-CATH WITH ULTRASOUND GUIDANCE;  Surgeon: Rolm Bookbinder, MD;  Location: [facility];  Service: General;  Laterality: Right;  . TUBAL LIGATION  02/02/2002   _0    PPTL  . UPPER GI ENDOSCOPY      PAST SOCIAL HISTORY:  Social History   Socioeconomic History  . Marital status: Legally Separated    Spouse name: Not on file  . Number of children: Not on file  . Years of education: Not on file  . Highest education level: Not on file  Occupational History  . Not on file  Tobacco Use  . Smoking status: Never Smoker  . Smokeless tobacco: Never Used  Substance and Sexual Activity  . Alcohol use: No  . Drug use: Never  . Sexual activity: Not on file  Other Topics Concern  . Not on file  Social History Narrative   1 boy and 1 girl   Resides in [place] Determinants of Health   Financial Resource Strain:   . Difficulty of Paying Living Expenses:   Food Insecurity:   . Worried About Charity fundraiser in the Last Year:   . Arboriculturist in the Last Year:   Transportation Needs:   . Film/video editor (Medical):   Marland Kitchen Lack of Transportation (Non-Medical):   Physical Activity:   . Days of Exercise per Week:   . Minutes of Exercise per Session:   Stress:   . Feeling of Stress :   Social Connections:   . Frequency of Communication with Friends and Family:   . Frequency of Social Gatherings with Friends and Family:   . Attends Religious Services:   . Active Member of Clubs or Organizations:   . Attends Archivist Meetings:   Marland Kitchen Marital Status:   Intimate Partner Violence:   . Fear of Current or Ex-Partner:   . Emotionally Abused:   Marland Kitchen Physically Abused:   . Sexually Abused:      PAST FAMILY HISTORY: Family History  Problem Relation Age of Onset  . Hypertension Mother   . Diabetes Father   . Hypertension Sister   . Diabetes Paternal Grandmother   . Colon cancer Paternal Grandmother 86  . CAD Brother   . Diabetes Brother   . Breast cancer Maternal Aunt        dx >50  . Heart attack Paternal Grandfather   . Breast cancer Maternal Aunt        dx under 65  . Breast cancer Maternal Aunt        dx  under 50    MEDICATIONS  Current Outpatient Medications  Medication Sig Dispense Refill  . Accu-Chek FastClix Lancets MISC Use as instructed. Inject into the skin twice daily 100 each 3  . atorvastatin (LIPITOR) 40 MG tablet Take 1 tablet (40 mg total) by mouth daily. 90 tablet 1  . Blood Glucose Calibration (ACCU-CHEK GUIDE CONTROL) LIQD 1 each by In Vitro route once as needed for up to 1 dose. 1 each 0  . cholecalciferol (VITAMIN D) 1000 units tablet Take 1,000 Units by mouth daily.    Marland Kitchen docusate sodium (COLACE) 100 MG capsule Take 1 capsule (100 mg total) by mouth 2 (two) times daily. 60 capsule 3  . ferrous sulfate 325 (65 FE) MG tablet TAKE 1 TABLET (325 MG TOTAL) BY MOUTH DAILY BEFORE BREAKFAST. 90 tablet 1  . glimepiride (AMARYL) 2 MG tablet Take 1 tablet (2 mg total) by mouth daily with breakfast. To lower blood sugar (Patient taking differently: Take 2 mg by mouth daily with breakfast. To lower blood sugar) 90 tablet 3  . glucose blood (ACCU-CHEK GUIDE) test strip Use as instructed. Check blood glucose by fingerstick twice per day. 100 each 12  . Lancet Devices (ACCU-CHEK SOFTCLIX) lancets Use as instructed 1 each 0  . lidocaine-prilocaine (EMLA) cream Apply to affected area once 30 g 3  . lisinopril (ZESTRIL) 10 MG tablet Take 1 tablet (10 mg total) by mouth daily. To lower blood pressure (Patient taking differently: Take 10 mg by mouth daily. To lower blood pressure) 90 tablet 3  . metFORMIN (GLUCOPHAGE-XR) 500 MG 24 hr tablet TAKE 2 TABLETS BY MOUTH  EVERY DAY WITH BREAKFAST 180 tablet 0  . methocarbamol (ROBAXIN)  500 MG tablet Take 2 tablets (1,000 mg total) by mouth at bedtime as needed for muscle spasms. 60 tablet 0  . ondansetron (ZOFRAN) 8 MG tablet Take 1 tablet (8 mg total) by mouth every 8 (eight) hours as needed for nausea. 30 tablet 3  . oxyCODONE-acetaminophen (PERCOCET/ROXICET) 5-325 MG tablet Take 1 tablet by mouth every 8 (eight) hours as needed for severe pain. 60 tablet 0  . pantoprazole (PROTONIX) 20 MG tablet Take 1 tablet (20 mg total) by mouth daily. 14 tablet 0   No current facility-administered medications for this encounter.     Physical Findings:  height is '5\' 1"'$  (1.549 m) and weight is 149 lb (67.6 kg). Her temperature is 98.3 F (36.8 C). Her blood pressure is 119/65 and her pulse is 98. Her respiration is 18 and oxygen saturation is 98%.    In general this is a well appearing African-American female in no acute distress.  She's alert and oriented x4 and appropriate throughout the examination. Cardiopulmonary assessment is negative for acute distress and she exhibits normal effort.  She points to her iliac region bilaterally posteriorly in the pelvis as well as her upper right hip, along the socket region, and upper lumbar spine region.  These are the sites she identifies as being painful   Lab Findings: Lab Results  Component Value Date   WBC 11.6 (H) 12/26/2019   HGB 10.6 (L) 12/26/2019   HCT 33.7 (L) 12/26/2019   MCV 93.1 12/26/2019   PLT 313 12/26/2019     Radiographic Findings: No results found.  Impression/Plan: 1. Stage IA, pT1cN0M0 grade 3, ER/PR negative, HER2 amplified, invasive ductal carcinoma with DCIS of the left breast with bone metastases.  The patient will proceed with her additional diagnostic imaging tomorrow and next week.  We will follow-up with these results as well.  She was offered palliative radiotherapy to multiple levels including bilateral iliac bones, right proximal femur,  L2-3 region, and Dr. Lisbeth Renshaw discusses the rationale for also treating her mid thoracic spine to reduce the risks of progressive disease that could cause compromise to her spinal cord.  She is encouraged to let us know if she has any changes in her sensory or motor function, and she will simulate today.  We reviewed the risks, benefits, short and long-term effects of therapy as well as delivery and logistics of treatment.  Dr. Lisbeth Renshaw recommends 3 weeks of conventional therapy to the sites for pain relief and to the thoracic spine to reduce the risk of final cord injury.  She is in agreement with this plan.  She will also continue Kadcyla under the care of Dr. [name]. Written consent is obtained and placed in the chart, a copy was provided to the patient. She will simulate this morning and hopefully start next Monday. 2. Pain from bone metastases, the patient will continue oxycodone, she will notify us if she needs any refills of this medication. 3. Opioid-induced constipation.  We discussed milk of magnesia twice daily until her bowels start moving more regularly and then transitioning to MiraLAX twice daily.  She is in agreement with this plan and will let us know if she needs any additional guidance.   It is notable that the patient is deaf and uses ASL sign language to communicate.  An interpreter and her family member were in the room assisting with translation.  In a visit lasting 45 minutes, greater than 50% of the time was spent face to face discussing the patient's condition,  in preparation for the discussion, and coordinating the patient's care.     Carola Rhine, PAC

## 2019-12-29 ENCOUNTER — Ambulatory Visit (HOSPITAL_COMMUNITY): Payer: Medicaid Other

## 2019-12-31 DIAGNOSIS — C7951 Secondary malignant neoplasm of bone: Secondary | ICD-10-CM | POA: Diagnosis not present

## 2019-12-31 DIAGNOSIS — Z510 Encounter for antineoplastic radiation therapy: Secondary | ICD-10-CM | POA: Diagnosis not present

## 2020-01-01 ENCOUNTER — Other Ambulatory Visit: Payer: Self-pay

## 2020-01-01 ENCOUNTER — Ambulatory Visit: Payer: Medicaid Other | Admitting: Hematology and Oncology

## 2020-01-01 ENCOUNTER — Ambulatory Visit
Admission: RE | Admit: 2020-01-01 | Discharge: 2020-01-01 | Disposition: A | Discharge status: Home / Self Care | Payer: Medicaid Other | Source: Ambulatory Visit | Attending: Radiation Oncology | Admitting: Radiation Oncology

## 2020-01-01 ENCOUNTER — Ambulatory Visit (HOSPITAL_COMMUNITY)
Admission: RE | Admit: 2020-01-01 | Discharge: 2020-01-01 | Disposition: A | Discharge status: Home / Self Care | Payer: Medicaid Other | Source: Ambulatory Visit | Attending: Hematology and Oncology | Admitting: Hematology and Oncology

## 2020-01-01 DIAGNOSIS — C50919 Malignant neoplasm of unspecified site of unspecified female breast: Secondary | ICD-10-CM | POA: Insufficient documentation

## 2020-01-01 DIAGNOSIS — Z171 Estrogen receptor negative status [ER-]: Secondary | ICD-10-CM | POA: Diagnosis present

## 2020-01-01 DIAGNOSIS — Z510 Encounter for antineoplastic radiation therapy: Secondary | ICD-10-CM | POA: Diagnosis not present

## 2020-01-01 DIAGNOSIS — C50512 Malignant neoplasm of lower-outer quadrant of left female breast: Secondary | ICD-10-CM | POA: Insufficient documentation

## 2020-01-01 DIAGNOSIS — C7951 Secondary malignant neoplasm of bone: Secondary | ICD-10-CM | POA: Diagnosis not present

## 2020-01-01 MED ORDER — HEPARIN SOD (PORK) LOCK FLUSH 100 UNIT/ML IV SOLN
INTRAVENOUS | Status: AC
  Filled 2020-01-01: qty 5

## 2020-01-01 MED ORDER — HEPARIN SOD (PORK) LOCK FLUSH 100 UNIT/ML IV SOLN
500.0000 [IU] | Freq: Once | INTRAVENOUS | Status: AC
  Administered 2020-01-01: 500 [IU] via INTRAVENOUS

## 2020-01-01 MED ORDER — IOHEXOL 300 MG/ML  SOLN
100.0000 mL | Freq: Once | INTRAMUSCULAR | Status: AC | PRN
  Administered 2020-01-01: 15:00:00 100 mL via INTRAVENOUS

## 2020-01-01 MED ORDER — SODIUM CHLORIDE (PF) 0.9 % IJ SOLN
INTRAMUSCULAR | Status: AC
  Filled 2020-01-01: qty 50

## 2020-01-02 ENCOUNTER — Other Ambulatory Visit: Payer: Self-pay

## 2020-01-02 ENCOUNTER — Ambulatory Visit
Admission: RE | Admit: 2020-01-02 | Discharge: 2020-01-02 | Disposition: A | Discharge status: Home / Self Care | Payer: Medicaid Other | Source: Ambulatory Visit | Attending: Radiation Oncology | Admitting: Radiation Oncology

## 2020-01-02 DIAGNOSIS — Z510 Encounter for antineoplastic radiation therapy: Secondary | ICD-10-CM | POA: Diagnosis not present

## 2020-01-03 ENCOUNTER — Ambulatory Visit
Admission: RE | Admit: 2020-01-03 | Discharge: 2020-01-03 | Disposition: A | Discharge status: Home / Self Care | Payer: Medicaid Other | Source: Ambulatory Visit | Attending: Radiation Oncology | Admitting: Radiation Oncology

## 2020-01-03 ENCOUNTER — Other Ambulatory Visit: Payer: Self-pay

## 2020-01-03 ENCOUNTER — Ambulatory Visit: Payer: Medicaid Other | Admitting: Hematology and Oncology

## 2020-01-03 DIAGNOSIS — Z510 Encounter for antineoplastic radiation therapy: Secondary | ICD-10-CM | POA: Diagnosis not present

## 2020-01-04 ENCOUNTER — Other Ambulatory Visit: Payer: Self-pay

## 2020-01-04 ENCOUNTER — Encounter: Payer: Self-pay | Admitting: Radiation Oncology

## 2020-01-04 ENCOUNTER — Ambulatory Visit
Admission: RE | Admit: 2020-01-04 | Discharge: 2020-01-04 | Disposition: A | Discharge status: Home / Self Care | Payer: Medicaid Other | Source: Ambulatory Visit | Attending: Radiation Oncology | Admitting: Radiation Oncology

## 2020-01-04 DIAGNOSIS — Z510 Encounter for antineoplastic radiation therapy: Secondary | ICD-10-CM | POA: Diagnosis not present

## 2020-01-04 NOTE — Progress Notes (Signed)
Ms. Schreib is currently undergoing radiotherapy to her bilateral iliac bones, right proximal femur, and L2-3 region.  She is currently received 4 out of 15 fractions.  I was called back to the machine because of concerns of pain that she is having despite Percocet.  She was given a patch by a neighbor to use on her skin and wanted to have a prescription for this.  Upon further discussion, I was initially alarmed that it could have been a fentanyl patch, however the patient did wear this patch to treatment today and it was in the garbage can and upon further inspection it is difficult to completely make out the markings on it but it looks like a diclofenac patch.  We discussed that this would not be recommended due to the location of where she was placing this in the radiation field.  Rather we should use oral narcotic pain medicine until she has completed her therapy.  She is currently only taking 1 to 3 tablets of Percocet per day.  She has 5/325 strength.  We discussed if she was maximizing her medication to a point where she was potentially putting her liver a risk from the Tylenol, we can switch to oral oxycodone alone.  She is going to try taking 1 to 2 tablets every 4-6 hours with a max dose of 8 tablets a day due to the concern for her Tylenol dosing.  She will follow-up with [place] and let us know if she is starting to approach exhausting that number of tablets per day in which case we could make the change to oxycodone.  She does not appear to need a long-acting agent at this time given her low demands for narcotic relief.  Her mother was present as was the interpreter through [place].    Carola Rhine, PAC

## 2020-01-05 ENCOUNTER — Ambulatory Visit (HOSPITAL_COMMUNITY)
Admission: RE | Admit: 2020-01-05 | Discharge: 2020-01-05 | Disposition: A | Discharge status: Home / Self Care | Payer: Medicaid Other | Source: Ambulatory Visit | Attending: Hematology and Oncology | Admitting: Hematology and Oncology

## 2020-01-05 ENCOUNTER — Other Ambulatory Visit: Payer: Self-pay

## 2020-01-05 ENCOUNTER — Ambulatory Visit
Admission: RE | Admit: 2020-01-05 | Discharge: 2020-01-05 | Disposition: A | Discharge status: Home / Self Care | Payer: Medicaid Other | Source: Ambulatory Visit | Attending: Radiation Oncology | Admitting: Radiation Oncology

## 2020-01-05 DIAGNOSIS — C50919 Malignant neoplasm of unspecified site of unspecified female breast: Secondary | ICD-10-CM

## 2020-01-05 DIAGNOSIS — Z510 Encounter for antineoplastic radiation therapy: Secondary | ICD-10-CM | POA: Diagnosis not present

## 2020-01-05 DIAGNOSIS — C50512 Malignant neoplasm of lower-outer quadrant of left female breast: Secondary | ICD-10-CM | POA: Diagnosis not present

## 2020-01-05 DIAGNOSIS — Z171 Estrogen receptor negative status [ER-]: Secondary | ICD-10-CM | POA: Insufficient documentation

## 2020-01-05 DIAGNOSIS — C7951 Secondary malignant neoplasm of bone: Secondary | ICD-10-CM | POA: Diagnosis not present

## 2020-01-05 MED ORDER — GADOBUTROL 1 MMOL/ML IV SOLN
7.0000 mL | Freq: Once | INTRAVENOUS | Status: AC | PRN
  Administered 2020-01-05: 7 mL via INTRAVENOUS

## 2020-01-07 NOTE — Progress Notes (Signed)
Patient Care Team: Antony Blackbird, MD as PCP - General (Family Medicine) Excell Seltzer, MD (Inactive) as Consulting Physician (General Surgery) [name], MD as Consulting Physician (Hematology and Oncology) Kyung Rudd, MD as Consulting Physician (Radiation Oncology) [name], MD as Consulting Physician (Gynecologic Oncology) Delice Bison, Charlestine Massed, NP as Nurse Practitioner (Hematology and Oncology) Mauro Kaufmann, RN as Oncology Nurse Navigator Rockwell Germany, RN as Oncology Nurse Navigator  DIAGNOSIS:    ICD-10-CM   1. 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 Malignant neoplasm of lower-outer quadrant of left breast of female, estrogen receptor negative ([place])  C50.512    Z17.1     SUMMARY OF ONCOLOGIC HISTORY: Oncology History  Malignant neoplasm of lower-outer quadrant of left breast of female, estrogen receptor negative ([place])  05/11/2017 Initial Diagnosis   Left breast biopsy 3:30 position 6 cm from nipple: IDC with DCIS, lymphovascular invasion present, grade 2-3, ER 0%, PR 0%, HER-2 positive ratio 2.28, Ki-67 20%, 1 cm lesion left breast, T1b N0 stage IA clinical stage   06/09/2017 Surgery   Left lumpectomy: IDC grade 3, 1.2 cm, DCIS is present, margins negative, 0/6 lymph nodes negative, ER 0%, PR 0%, HER-2 positive ratio 2.28, Ki-67 20%, T1 BN 0 stage IA   07/09/2017 - 09/29/2017 Adjuvant Chemotherapy   Taxol/Herceptin x 12 completed on 09/29/2017, went on to receive every 3 week adjuvant Herceptin through 06/28/2018   11/25/2017 - 01/07/2018 Radiation Therapy    The patient initially received a dose of 50.4 Gy in 28 fractions to the breast using whole-breast tangent fields. This was delivered using a 3-D conformal technique. The patient then received a boost to the seroma. This delivered an additional 10 Gy in 5 fractions using a 3-D technique. The total dose was 60.4 Gy.   02/15/2018 Genetic Testing   The Common Hereditary Cancer Panel offered  by [facility] includes sequencing and/or deletion duplication testing of the following 47 genes: APC, ATM, AXIN2, BARD1, BMPR1A, BRCA1, BRCA2, BRIP1, CDH1, CDKN2A (p14ARF), CDKN2A (p16INK4a), CKD4, CHEK2, CTNNA1, DICER1, EPCAM (Deletion/duplication testing only), GREM1 (promoter region deletion/duplication testing only), KIT, MEN1, MLH1, MSH2, MSH3, MSH6, MUTYH, NBN, NF1, NHTL1, PALB2, PDGFRA, PMS2, POLD1, POLE, PTEN, RAD50, RAD51C, RAD51D, SDHB, SDHC, SDHD, SMAD4, SMARCA4. STK11, TP53, TSC1, TSC2, and VHL.  The following genes were evaluated for sequence changes only: SDHA and HOXB13 c.251G>A variant only.   Results: POSITIVE. 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 The date of this test report is 02/15/2018   10/20/2019 PET scan   Bone mets involving Right hip, Bil Iliac bones, T8, T9, L2 and L3, Rt prox humerus, Righ glenoid. Some lytic and some sclerotic. Mild Left supraclav adenopathy, 2 small lesions basal ganglia (could be lacunar infarcts)   11/14/2019 -  Chemotherapy   The patient had palonosetron (ALOXI) injection 0.25 mg, 0.25 mg, Intravenous,  Once, 2 of 5 cycles Administration: 0.25 mg (12/05/2019), 0.25 mg (12/26/2019) ado-trastuzumab emtansine (KADCYLA) 260 mg in sodium chloride 0.9 % 250 mL chemo infusion, 3.6 mg/kg = 260 mg, Intravenous, Once, 3 of 6 cycles Administration: 260 mg (11/14/2019), 260 mg (12/05/2019), 260 mg (12/26/2019)  for chemotherapy treatment.    Fallopian tube cancer, carcinoma, right ([place])  11/04/2017 Surgery   Uterus, cervix and bilateral fallopian tubes: Fallopian tube with serous carcinoma 0.6 cm positive for p53, PAX8, WT-1, MOC-31, cytokeratin 7, and estrogen receptor   01/11/2018 -  Chemotherapy   Taxol and carboplatin every 3 weeks (Taxol discontinued with cycle 2  because of profound rash)   10/20/2019 PET scan   Bone mets involving Right hip, Bil Iliac bones, T8, T9, L2 and L3, Rt prox humerus, Righ glenoid. Some lytic and some  sclerotic. Mild Left supraclav adenopathy, 2 small lesions basal ganglia (could be lacunar infarcts)   11/14/2019 -  Chemotherapy   The patient had palonosetron (ALOXI) injection 0.25 mg, 0.25 mg, Intravenous,  Once, 2 of 5 cycles Administration: 0.25 mg (12/05/2019), 0.25 mg (12/26/2019) ado-trastuzumab emtansine (KADCYLA) 260 mg in sodium chloride 0.9 % 250 mL chemo infusion, 3.6 mg/kg = 260 mg, Intravenous, Once, 3 of 6 cycles Administration: 260 mg (11/14/2019), 260 mg (12/05/2019), 260 mg (12/26/2019)  for chemotherapy treatment.      CHIEF COMPLIANT: Follow-up ofmetastatic breast cancer to discuss treatment plan  INTERVAL HISTORY: Erin [name] is a 59 y.o. with above-mentioned history of metastaticbreast cancerwith bone metastases,and fallopian tube cancer.She is currently on treatment with Kadcyla every 3 weeks and undergoing palliative radiation.CT CAP on 01/01/20 showed progression of osseous metastases in the spine, pelvis, and right femur, in addition to new hepatic lesions concerning for metastases. Lumbar spine MRI on 01/05/20 showed extensive osseous metastatic disease, with tumor replacement of the L3 vertebral body. She presents to the clinic todayto discuss the treatment plan.  ALLERGIES:  has No Known Allergies.  MEDICATIONS:  Current Outpatient Medications  Medication Sig Dispense Refill  . Accu-Chek FastClix Lancets MISC Use as instructed. Inject into the skin twice daily 100 each 3  . atorvastatin (LIPITOR) 40 MG tablet Take 1 tablet (40 mg total) by mouth daily. 90 tablet 1  . Blood Glucose Calibration (ACCU-CHEK GUIDE CONTROL) LIQD 1 each by In Vitro route once as needed for up to 1 dose. 1 each 0  . cholecalciferol (VITAMIN D) 1000 units tablet Take 1,000 Units by mouth daily.    Marland Kitchen docusate sodium (COLACE) 100 MG capsule Take 1 capsule (100 mg total) by mouth 2 (two) times daily. 60 capsule 3  . ferrous sulfate 325 (65 FE) MG tablet TAKE 1 TABLET (325 MG TOTAL) BY  MOUTH DAILY BEFORE BREAKFAST. 90 tablet 1  . glimepiride (AMARYL) 2 MG tablet Take 1 tablet (2 mg total) by mouth daily with breakfast. To lower blood sugar (Patient taking differently: Take 2 mg by mouth daily with breakfast. To lower blood sugar) 90 tablet 3  . glucose blood (ACCU-CHEK GUIDE) test strip Use as instructed. Check blood glucose by fingerstick twice per day. 100 each 12  . Lancet Devices (ACCU-CHEK SOFTCLIX) lancets Use as instructed 1 each 0  . lidocaine-prilocaine (EMLA) cream Apply to affected area once 30 g 3  . lisinopril (ZESTRIL) 10 MG tablet Take 1 tablet (10 mg total) by mouth daily. To lower blood pressure (Patient taking differently: Take 10 mg by mouth daily. To lower blood pressure) 90 tablet 3  . metFORMIN (GLUCOPHAGE-XR) 500 MG 24 hr tablet TAKE 2 TABLETS BY MOUTH EVERY DAY WITH BREAKFAST 180 tablet 0  . methocarbamol (ROBAXIN) 500 MG tablet Take 2 tablets (1,000 mg total) by mouth at bedtime as needed for muscle spasms. 60 tablet 0  . ondansetron (ZOFRAN) 8 MG tablet Take 1 tablet (8 mg total) by mouth every 8 (eight) hours as needed for nausea. 30 tablet 3  . oxyCODONE-acetaminophen (PERCOCET/ROXICET) 5-325 MG tablet Take 1 tablet by mouth every 8 (eight) hours as needed for severe pain. 60 tablet 0  . pantoprazole (PROTONIX) 20 MG tablet Take 1 tablet (20 mg total) by mouth daily.  14 tablet 0   No current facility-administered medications for this visit.    PHYSICAL EXAMINATION: ECOG PERFORMANCE STATUS: 2 - Symptomatic, <50% confined to bed  Vitals:   01/08/20 1041  BP: 125/71  Pulse: (!) 109  Resp: 18  Temp: 98.7 F (37.1 C)  SpO2: 100%   Filed Weights   01/08/20 1041  Weight: 146 lb 14.4 oz (66.6 kg)    LABORATORY DATA:  I have reviewed the data as listed CMP Latest Ref Rng & Units 12/26/2019 12/05/2019 11/21/2019  Glucose 70 - 99 mg/dL 73 85 75  BUN 6 - 20 mg/dL _0 Creatinine 0.44 - 1.00 mg/dL 0.74 0.73 0.83  Sodium 135 - 145 mmol/L 137 142  139  Potassium 3.5 - 5.1 mmol/L 3.9 3.7 3.5  Chloride 98 - 111 mmol/L 101 105 102  CO2 22 - 32 mmol/L _1 Calcium 8.9 - 10.3 mg/dL 10.0 9.3 9.7  Total Protein 6.5 - 8.1 g/dL 8.6(H) 7.7 8.0  Total Bilirubin 0.3 - 1.2 mg/dL 0.3 0.3 0.4  Alkaline Phos 38 - 126 U/L 102 93 107  AST 15 - 41 U/L _2 ALT 0 - 44 U/L _3 Lab Results  Component Value Date   WBC 11.6 (H) 12/26/2019   HGB 10.6 (L) 12/26/2019   HCT 33.7 (L) 12/26/2019   MCV 93.1 12/26/2019   PLT 313 12/26/2019   NEUTROABS 7.7 12/26/2019    ASSESSMENT & PLAN:  Malignant neoplasm of lower-outer quadrant of left breast of female, estrogen receptor negative (HCC) Left lumpectomy: IDC grade 3, 1.2 cm, DCIS is present, margins negative, 0/6 lymph nodes negative, ER 0%, PR 0%, HER-2 positive ratio 2.28, Ki-67 20%, T1 BN 0 stage IA S/P Taxol-Herceptin BRCA 2 Mutation Positive  10/20/19:Bone mets involving Right hip, Bil Iliac bones, T8, T9, L2 and L3, Rt prox humerus, Righ glenoid. Some lytic and some sclerotic. Mild Left supraclav adenopathy, 2 small lesions basal ganglia (could be lacunar infarcts)  Biopsy the supra clav LN: 10/31/2019: Metastatic breast cancer, ER/PR negative HER-2 equivocal by IHC, FISHpositive Treatment plan: Kadcyla every 3-week treatment. ---------------------------------------------------------------------------------------------------------------------------------------------------------- Echocardiogram 11/14/2019 Current treatment:Kadcyla, will be discontinued  Severe pain in the low back and right hip: Currently on Percocet. MRI of the back 01/07/2020: Extensive metastatic disease to the involved spine.  Tumor replacement of L3 vertebral body with pathologic compression fracture, epidural and extraosseous extension.  Tumor extending throughout L3 foramen.  CT chest abdomen pelvis 01/02/2020: Interval progression of bone metastases involving the spine and bony pelvis as well as the  proximal right femur.  New multifocal low-density lesions in both lobes of the liver concerning for liver metastases.  Similar soft tissue infiltration left supraclavicular region.  L3 epidural extension of the tumor.  Rapid increase in Ca1 25: Coincided with the progression of disease noted on the imaging studies.  Recommendation: Switch her treatment to talazoparib  Talazoparib counseling: Talazoparib is a poly(adenosine diphosphate-ribose) inhibitor talazoparib has shown antitumor activity in patients with advanced breast cancer and germline mutations in BRCA1 and BRCA2.  EMBRACA trial revealed that, in comparison to chemotherapy, median progression-free survival was significantly longer in the talazoparib group than in the standard-therapy group 8.6 months vs. 5.6 months. Objective response rate was higher in the talazoparib group than in the standard-therapy group (62.6% vs. 27.2%).  Major toxicities are related to anemia and other hematological changes with thrombocytopenia and leukopenia.  AST ALT will need to be monitored.  Fatigue, some hair thinning, nausea and taste changes are also possible.  We will initiate Xgeva for bone metastases.  She will get it every 3 months. She will start talazoparib as soon as radiation is complete.    No orders of the defined types were placed in this encounter.  The patient has a good understanding of the overall plan. she agrees with it. she will call with any problems that may develop before the next visit here.  Total time spent: 45 mins including face to face time and time spent for planning, charting and coordination of care  [name], MD 01/08/2020  I, Cloyde Reams Dorshimer, am acting as scribe for [name].  I have reviewed the above documentation for accuracy and completeness, and I agree with the above.

## 2020-01-08 ENCOUNTER — Telehealth: Payer: Self-pay | Admitting: Pharmacist

## 2020-01-08 ENCOUNTER — Telehealth: Payer: Self-pay

## 2020-01-08 ENCOUNTER — Ambulatory Visit
Admission: RE | Admit: 2020-01-08 | Discharge: 2020-01-08 | Disposition: A | Discharge status: Home / Self Care | Payer: Medicaid Other | Source: Ambulatory Visit | Attending: Radiation Oncology | Admitting: Radiation Oncology

## 2020-01-08 ENCOUNTER — Ambulatory Visit: Payer: Medicaid Other

## 2020-01-08 ENCOUNTER — Inpatient Hospital Stay (HOSPITAL_BASED_OUTPATIENT_CLINIC_OR_DEPARTMENT_OTHER): Payer: Medicaid Other | Admitting: Hematology and Oncology

## 2020-01-08 ENCOUNTER — Other Ambulatory Visit: Payer: Self-pay

## 2020-01-08 VITALS — BP 125/71 | HR 109 | Temp 98.7°F | Resp 18 | Ht 61.0 in | Wt 146.9 lb

## 2020-01-08 DIAGNOSIS — C50512 Malignant neoplasm of lower-outer quadrant of left female breast: Secondary | ICD-10-CM

## 2020-01-08 DIAGNOSIS — C5701 Malignant neoplasm of right fallopian tube: Secondary | ICD-10-CM | POA: Diagnosis not present

## 2020-01-08 DIAGNOSIS — Z510 Encounter for antineoplastic radiation therapy: Secondary | ICD-10-CM | POA: Diagnosis not present

## 2020-01-08 DIAGNOSIS — Z5112 Encounter for antineoplastic immunotherapy: Secondary | ICD-10-CM | POA: Diagnosis not present

## 2020-01-08 DIAGNOSIS — Z171 Estrogen receptor negative status [ER-]: Secondary | ICD-10-CM

## 2020-01-08 MED ORDER — TALAZOPARIB TOSYLATE 1 MG PO CAPS: 1.0000 mg | ORAL_CAPSULE | Freq: Every day | ORAL | 3 refills | Status: DC

## 2020-01-08 MED ORDER — FENTANYL 25 MCG/HR TD PT72: 1.0000 | MEDICATED_PATCH | TRANSDERMAL | 0 refills | Status: DC

## 2020-01-08 MED ORDER — ONDANSETRON 4 MG PO TBDP: 4.0000 mg | ORAL_TABLET | Freq: Three times a day (TID) | ORAL | 0 refills | Status: DC | PRN

## 2020-01-08 MED ORDER — PANTOPRAZOLE SODIUM 20 MG PO TBEC: 20.0000 mg | DELAYED_RELEASE_TABLET | Freq: Every day | ORAL | 6 refills | Status: DC

## 2020-01-08 NOTE — Telephone Encounter (Signed)
Oral Oncology Patient Advocate Encounter  After completing a benefits investigation, prior authorization for Erin [name] is not required at this time through [facility]  Patient's copay is $3.     [name] Patient [place] Phone [phone] Fax [phone] 01/08/2020 3:42 PM

## 2020-01-08 NOTE — Assessment & Plan Note (Signed)
Left lumpectomy: IDC grade 3, 1.2 cm, DCIS is present, margins negative, 0/6 lymph nodes negative, ER 0%, PR 0%, HER-2 positive ratio 2.28, Ki-67 20%, T1 BN 0 stage IA S/P Taxol-Herceptin BRCA 2 Mutation Positive  10/20/19:Bone mets involving Right hip, Bil Iliac bones, T8, T9, L2 and L3, Rt prox humerus, Righ glenoid. Some lytic and some sclerotic. Mild Left supraclav adenopathy, 2 small lesions basal ganglia (could be lacunar infarcts)  Biopsy the supra clav LN: 10/31/2019: Metastatic breast cancer, ER/PR negative HER-2 equivocal by IHC, FISHpositive Treatment plan: Kadcyla every 3-week treatment. ---------------------------------------------------------------------------------------------------------------------------------------------------------- Echocardiogram 11/14/2019 Current treatment:Kadcyla  Severe pain in the low back and right hip: Currently on Percocet. MRI of the back 01/07/2020: Extensive metastatic disease to the involved spine.  Tumor replacement of L3 vertebral body with pathologic compression fracture, epidural and extraosseous extension.  Tumor extending throughout L3 foramen.  CT chest abdomen pelvis 01/02/2020: Interval progression of bone metastases involving the spine and bony pelvis as well as the proximal right femur.  New multifocal low-density lesions in both lobes of the liver concerning for liver metastases.  Similar soft tissue infiltration left supraclavicular region.  L3 epidural extension of the tumor.  Rapid increase in Ca1 25: Coincided with the progression of disease noted on the imaging studies.  Recommendation: Switch her treatment to talazoparib  Talazoparib counseling: Talazoparib is a poly(adenosine diphosphate-ribose) inhibitor talazoparib has shown antitumor activity in patients with advanced breast cancer and germline mutations in BRCA1 and BRCA2.  EMBRACA trial revealed that, in comparison to chemotherapy, median progression-free survival was  significantly longer in the talazoparib group than in the standard-therapy group 8.6 months vs. 5.6 months. Objective response rate was higher in the talazoparib group than in the standard-therapy group (62.6% vs. 27.2%).  Major toxicities are related to anemia and other hematological changes with thrombocytopenia and leukopenia.  AST ALT will need to be monitored.  Fatigue, some hair thinning, nausea and taste changes are also possible.  She will start talazoparib as soon as radiation is complete.

## 2020-01-08 NOTE — Telephone Encounter (Cosign Needed)
Oral Oncology Pharmacy Student Encounter  Received new prescription for Talzenna (talazoparib) for the treatment of metastatic breast cancer with BRCA2 mutation, planned duration until disease progression or unacceptable toxicity.  Labs from 12/26/19 assessed, WBC 11.6, RBC 3.62, Hgb 10.6, HCT 33.7. Prescription dose and frequency assessed.   Current medication list in Epic reviewed, no DDIs identified with Talzenna 1 mg.  Prescription has been e-scribed to the [facility] for benefits analysis and approval.  Oral Oncology Clinic will continue to follow for insurance authorization, copayment issues, initial counseling and start date.   , 4th Year PharmD Candidate [facility] [phone]  01/08/2020 3:51 PM 

## 2020-01-09 ENCOUNTER — Ambulatory Visit
Admission: RE | Admit: 2020-01-09 | Discharge: 2020-01-09 | Disposition: A | Discharge status: Home / Self Care | Payer: Medicaid Other | Source: Ambulatory Visit | Attending: Radiation Oncology | Admitting: Radiation Oncology

## 2020-01-09 ENCOUNTER — Ambulatory Visit: Payer: Medicaid Other

## 2020-01-09 ENCOUNTER — Other Ambulatory Visit: Payer: Self-pay

## 2020-01-09 DIAGNOSIS — Z510 Encounter for antineoplastic radiation therapy: Secondary | ICD-10-CM | POA: Diagnosis not present

## 2020-01-09 NOTE — Telephone Encounter (Signed)
Oral Chemotherapy Pharmacist Encounter   Reviewed and agree the pharmacy student assessment. Plan is for patient to start the Talzenna at the completion of her radiation. Final radiation treatment scheduled for 01/19/20.  Darl Pikes, PharmD, BCPS, BCOP, CPP Hematology/Oncology Clinical Pharmacist [facility] [place] [phone]  01/09/2020 3:02 PM

## 2020-01-10 ENCOUNTER — Ambulatory Visit
Admission: RE | Admit: 2020-01-10 | Discharge: 2020-01-10 | Disposition: A | Discharge status: Home / Self Care | Payer: Medicaid Other | Source: Ambulatory Visit | Attending: Radiation Oncology | Admitting: Radiation Oncology

## 2020-01-10 ENCOUNTER — Other Ambulatory Visit: Payer: Self-pay

## 2020-01-10 ENCOUNTER — Ambulatory Visit: Payer: Medicaid Other

## 2020-01-10 DIAGNOSIS — Z510 Encounter for antineoplastic radiation therapy: Secondary | ICD-10-CM | POA: Diagnosis not present

## 2020-01-10 MED FILL — TALZENNA 1 MG CAPS: 1 | 30 days supply | Qty: 30 | Fill #0

## 2020-01-10 NOTE — Telephone Encounter (Signed)
Oral Chemotherapy Pharmacist Encounter  Patient plans on picking up her medication from [place] on 01/10/20. She knows the plan is for her to get start following the completion of her radiation treatment.  Patient Education I spoke with patient on 01/09/20 for overview of new oral chemotherapy medication: Talzenna (talazoparib) for the treatment of metastatic breast cancer with BRCA2 mutation, planned duration until disease progression or unacceptable toxicity.   Pt is doing well. Counseled patient on administration, dosing, side effects, monitoring, drug-food interactions, safe handling, storage, and disposal. Patient will take 1 capsule (1 mg total) by mouth daily.  Side effects include but not limited to: fatigue, N/V, HA, decreased wbc/hgb/plt.    Reviewed with patient importance of keeping a medication schedule and plan for any missed doses.  Erin [name] voiced understanding and appreciation. All questions answered. Medication handout placed in the mail.  Provided patient with [facility] phone number. Patient knows to call the office with questions or concerns. Oral Chemotherapy Navigation Clinic will continue to follow.  Erin [name], PharmD, BCPS, BCOP, CPP Hematology/Oncology Clinical Pharmacist [facility] [facility] [phone]  01/10/2020 4:35 PM

## 2020-01-11 ENCOUNTER — Other Ambulatory Visit: Payer: Self-pay

## 2020-01-11 ENCOUNTER — Ambulatory Visit: Payer: Medicaid Other

## 2020-01-11 ENCOUNTER — Ambulatory Visit
Admission: RE | Admit: 2020-01-11 | Discharge: 2020-01-11 | Disposition: A | Discharge status: Home / Self Care | Payer: Medicaid Other | Source: Ambulatory Visit | Attending: Radiation Oncology | Admitting: Radiation Oncology

## 2020-01-11 DIAGNOSIS — Z510 Encounter for antineoplastic radiation therapy: Secondary | ICD-10-CM | POA: Diagnosis not present

## 2020-01-12 ENCOUNTER — Other Ambulatory Visit: Payer: Self-pay

## 2020-01-12 ENCOUNTER — Other Ambulatory Visit: Payer: Self-pay | Admitting: Radiation Oncology

## 2020-01-12 ENCOUNTER — Ambulatory Visit
Admission: RE | Admit: 2020-01-12 | Discharge: 2020-01-12 | Disposition: A | Discharge status: Home / Self Care | Payer: Medicaid Other | Source: Ambulatory Visit | Attending: Radiation Oncology | Admitting: Radiation Oncology

## 2020-01-12 ENCOUNTER — Ambulatory Visit: Payer: Medicaid Other

## 2020-01-12 DIAGNOSIS — Z510 Encounter for antineoplastic radiation therapy: Secondary | ICD-10-CM | POA: Diagnosis not present

## 2020-01-12 DIAGNOSIS — C7951 Secondary malignant neoplasm of bone: Secondary | ICD-10-CM | POA: Diagnosis not present

## 2020-01-12 MED ORDER — SUCRALFATE 1 G PO TABS: 1.0000 g | ORAL_TABLET | Freq: Four times a day (QID) | ORAL | 2 refills | Status: DC

## 2020-01-15 ENCOUNTER — Other Ambulatory Visit: Payer: Self-pay

## 2020-01-15 ENCOUNTER — Ambulatory Visit
Admission: RE | Admit: 2020-01-15 | Discharge: 2020-01-15 | Disposition: A | Discharge status: Home / Self Care | Payer: Medicaid Other | Source: Ambulatory Visit | Attending: Radiation Oncology | Admitting: Radiation Oncology

## 2020-01-15 DIAGNOSIS — C7951 Secondary malignant neoplasm of bone: Secondary | ICD-10-CM | POA: Diagnosis present

## 2020-01-15 DIAGNOSIS — Z171 Estrogen receptor negative status [ER-]: Secondary | ICD-10-CM | POA: Diagnosis not present

## 2020-01-15 DIAGNOSIS — C50912 Malignant neoplasm of unspecified site of left female breast: Secondary | ICD-10-CM | POA: Diagnosis not present

## 2020-01-15 DIAGNOSIS — Z510 Encounter for antineoplastic radiation therapy: Secondary | ICD-10-CM | POA: Insufficient documentation

## 2020-01-15 DIAGNOSIS — K209 Esophagitis, unspecified without bleeding: Secondary | ICD-10-CM | POA: Insufficient documentation

## 2020-01-16 ENCOUNTER — Telehealth: Payer: Self-pay | Admitting: Hematology and Oncology

## 2020-01-16 ENCOUNTER — Ambulatory Visit: Payer: Medicaid Other | Admitting: Hematology and Oncology

## 2020-01-16 ENCOUNTER — Other Ambulatory Visit: Payer: Self-pay | Admitting: Radiation Oncology

## 2020-01-16 ENCOUNTER — Inpatient Hospital Stay: Payer: Medicaid Other

## 2020-01-16 ENCOUNTER — Other Ambulatory Visit: Payer: Medicaid Other

## 2020-01-16 ENCOUNTER — Ambulatory Visit: Payer: Medicaid Other

## 2020-01-16 ENCOUNTER — Inpatient Hospital Stay: Payer: Medicaid Other | Admitting: Hematology and Oncology

## 2020-01-16 ENCOUNTER — Encounter: Payer: Self-pay | Admitting: Radiation Oncology

## 2020-01-16 ENCOUNTER — Inpatient Hospital Stay: Payer: Medicaid Other | Attending: Gynecologic Oncology

## 2020-01-16 ENCOUNTER — Ambulatory Visit
Admission: RE | Admit: 2020-01-16 | Discharge: 2020-01-16 | Disposition: A | Discharge status: Home / Self Care | Payer: Medicaid Other | Source: Ambulatory Visit | Attending: Radiation Oncology | Admitting: Radiation Oncology

## 2020-01-16 ENCOUNTER — Telehealth: Payer: Self-pay | Admitting: *Deleted

## 2020-01-16 VITALS — BP 104/60 | HR 89 | Temp 98.7°F | Resp 18

## 2020-01-16 DIAGNOSIS — K209 Esophagitis, unspecified without bleeding: Secondary | ICD-10-CM

## 2020-01-16 DIAGNOSIS — C7951 Secondary malignant neoplasm of bone: Secondary | ICD-10-CM

## 2020-01-16 DIAGNOSIS — Z510 Encounter for antineoplastic radiation therapy: Secondary | ICD-10-CM | POA: Diagnosis not present

## 2020-01-16 DIAGNOSIS — Z95828 Presence of other vascular implants and grafts: Secondary | ICD-10-CM

## 2020-01-16 LAB — CBC WITH DIFFERENTIAL (CANCER CENTER ONLY)
Abs Immature Granulocytes: 0.03 10*3/uL (ref 0.00–0.07)
Basophils Absolute: 0 10*3/uL (ref 0.0–0.1)
Basophils Relative: 0 %
Eosinophils Absolute: 0.1 10*3/uL (ref 0.0–0.5)
Eosinophils Relative: 2 %
HCT: 35.7 % — ABNORMAL LOW (ref 36.0–46.0)
Hemoglobin: 11.6 g/dL — ABNORMAL LOW (ref 12.0–15.0)
Immature Granulocytes: 1 %
Lymphocytes Relative: 7 %
Lymphs Abs: 0.5 10*3/uL — ABNORMAL LOW (ref 0.7–4.0)
MCH: 29.3 pg (ref 26.0–34.0)
MCHC: 32.5 g/dL (ref 30.0–36.0)
MCV: 90.2 fL (ref 80.0–100.0)
Monocytes Absolute: 0.7 10*3/uL (ref 0.1–1.0)
Monocytes Relative: 12 %
Neutro Abs: 4.9 10*3/uL (ref 1.7–7.7)
Neutrophils Relative %: 78 %
Platelet Count: 130 10*3/uL — ABNORMAL LOW (ref 150–400)
RBC: 3.96 MIL/uL (ref 3.87–5.11)
RDW: 15.7 % — ABNORMAL HIGH (ref 11.5–15.5)
WBC Count: 6.3 10*3/uL (ref 4.0–10.5)
nRBC: 0 % (ref 0.0–0.2)

## 2020-01-16 LAB — CMP (CANCER CENTER ONLY)
ALT: 25 U/L (ref 0–44)
AST: 30 U/L (ref 15–41)
Albumin: 3.6 g/dL (ref 3.5–5.0)
Alkaline Phosphatase: 102 U/L (ref 38–126)
Anion gap: 11 (ref 5–15)
BUN: 7 mg/dL (ref 6–20)
CO2: 26 mmol/L (ref 22–32)
Calcium: 9.6 mg/dL (ref 8.9–10.3)
Chloride: 101 mmol/L (ref 98–111)
Creatinine: 0.75 mg/dL (ref 0.44–1.00)
GFR, Est AFR Am: >60 mL/min (ref >60)
GFR, Estimated: >60 mL/min (ref >60)
Glucose, Bld: 101 mg/dL — ABNORMAL HIGH (ref 70–99)
Potassium: 3.7 mmol/L (ref 3.5–5.1)
Sodium: 138 mmol/L (ref 135–145)
Total Bilirubin: 0.4 mg/dL (ref 0.3–1.2)
Total Protein: 8.7 g/dL — ABNORMAL HIGH (ref 6.5–8.1)

## 2020-01-16 MED ORDER — ONDANSETRON HCL 4 MG/2ML IJ SOLN
8.0000 mg | Freq: Once | INTRAMUSCULAR | Status: AC
  Administered 2020-01-16: 8 mg via INTRAVENOUS

## 2020-01-16 MED ORDER — SODIUM CHLORIDE 0.9 % IV SOLN
Freq: Once | INTRAVENOUS | Status: AC
  Filled 2020-01-16: qty 250

## 2020-01-16 MED ORDER — HEPARIN SOD (PORK) LOCK FLUSH 100 UNIT/ML IV SOLN
500.0000 [IU] | Freq: Once | INTRAVENOUS | Status: AC | PRN
  Administered 2020-01-16: 14:00:00 500 [IU]
  Filled 2020-01-16: qty 5

## 2020-01-16 MED ORDER — ONDANSETRON HCL 4 MG/2ML IJ SOLN
INTRAMUSCULAR | Status: AC
  Filled 2020-01-16: qty 4

## 2020-01-16 MED ORDER — SODIUM CHLORIDE 0.9% FLUSH
10.0000 mL | INTRAVENOUS | Status: DC | PRN
  Administered 2020-01-16: 14:00:00 10 mL
  Filled 2020-01-16: qty 10

## 2020-01-16 MED ORDER — PROMETHAZINE HCL 12.5 MG RE SUPP: 12.5000 mg | Freq: Four times a day (QID) | RECTAL | 0 refills | Status: DC | PRN

## 2020-01-16 NOTE — Telephone Encounter (Signed)
Scheduled appt per 5/3 sch message - pt to get an updated schedule in treatment area.

## 2020-01-16 NOTE — Patient Instructions (Signed)
Esophagitis  Esophagitis is inflammation of the esophagus. The esophagus is the tube that carries food from your mouth to your stomach. Esophagitis can cause soreness or pain in the esophagus. This condition can make it difficult and painful to swallow. What are the causes? Most causes of esophagitis are not serious. Common causes of this condition include:  Gastroesophageal reflux disease (GERD). This is when stomach contents move back up into the esophagus (reflux).  Repeated vomiting.  An allergic reaction, especially caused by food allergies (eosinophilic esophagitis).  Injury to the esophagus by swallowing large pills with or without water, or swallowing certain types of medicines.  Swallowing (ingesting) harmful chemicals, such as household cleaning products.  Heavy alcohol use.  An infection of the esophagus. This most often occurs in people who have a weakened immune system.  Radiation or chemotherapy treatment for cancer.  Certain diseases such as sarcoidosis, Crohn's disease, and scleroderma. What are the signs or symptoms? Symptoms of this condition include:  Difficult or painful swallowing.  Pain with swallowing acidic liquids, such as citrus juices.  Pain with burping.  Chest pain.  Difficulty breathing.  Nausea.  Vomiting.  Pain in the abdomen.  Weight loss.  Ulcers in the mouth.  Patches of white material in the mouth (candidiasis).  Fever.  Coughing up blood or vomiting blood.  Stool that is black, tarry, or bright red. How is this diagnosed? Your health care provider will take a medical history and perform a physical exam. You may also have other tests, including:  An endoscopy to examine your esophagus and stomach with a small flexible tube with a camera.  A test that measures the acidity level in your esophagus.  A test that measures how much pressure is on your esophagus.  A barium swallow or modified barium swallow to show the shape,  size, and functioning of your esophagus.  Allergy tests. How is this treated? Treatment for this condition depends on the cause of your esophagitis. In some cases, steroids or other medicines may be given to help relieve your symptoms or to treat the underlying cause of your condition. You may have to make some lifestyle changes, such as:  Avoiding alcohol.  Quitting smoking.  Changing your diet.  Exercising.  Changing your sleep habits and your sleep environment. Follow these instructions at home: Medicines  Take over-the-counter and prescription medicines only as told by your health care provider.  Do not take aspirin, ibuprofen, or other NSAIDs unless your health care provider told you to do so.  If you have trouble taking pills: ? Use a pill splitter to decrease the size of the pill. This will decrease the chance of the pill getting stuck or injuring your esophagus. ? Drink water after you take a pill. Eating and drinking   Avoid foods and drinks that seem to make your symptoms worse.  Follow a diet as recommended by your health care provider. This may involve avoiding foods and drinks such as: ? Coffee and tea (with or without caffeine). ? Drinks that contain alcohol. ? Energy drinks and sports drinks. ? Carbonated drinks or sodas. ? Chocolate and cocoa. ? Peppermint and mint flavorings. ? Garlic and onions. ? Horseradish. ? Spicy and acidic foods, including peppers, chili powder, curry powder, vinegar, hot sauces, and barbecue sauce. ? Citrus fruit juices and citrus fruits, such as oranges, lemons, and limes. ? Tomato-based foods, such as red sauce, chili, salsa, and pizza with red sauce. ? Fried and fatty foods, such as   donuts, french fries, potato chips, and high-fat dressings. ? High-fat meats, such as hot dogs and fatty cuts of red and white meats, such as rib eye steak, sausage, ham, and bacon. ? High-fat dairy items, such as whole milk, butter, and cream  cheese. Lifestyle  Eat small, frequent meals instead of large meals.  Avoid drinking large amounts of liquid with your meals.  Avoid eating meals during the 2-3 hours before bedtime.  Avoid lying down right after you eat.  Do not exercise right after you eat.  Do not use any products that contain nicotine or tobacco, such as cigarettes and e-cigarettes. If you need help quitting, ask your health care provider. General instructions   Pay attention to any changes in your symptoms. Let your health care provider know about them.  Wear loose-fitting clothing. Do not wear anything tight around your waist that causes pressure on your abdomen.  Raise (elevate) the head of your bed about 6 inches (15 cm).  Try relaxation strategies such as yoga, deep breathing, or meditation to manage stress. If you need help reducing stress, ask your health care provider.  If you are overweight, reduce your weight to an amount that is healthy for you. Ask your health care provider for guidance about a safe weight loss goal.  Keep all follow-up visits as told by your health care provider. This is important. Contact a health care provider if:  You have new symptoms.  You have unexplained weight loss.  You have difficulty swallowing, or it hurts to swallow.  You have wheezing or a cough that does not go away.  Your symptoms do not improve with treatment.  You have frequent heartburn for more than two weeks. Get help right away if:  You have severe pain in your arms, neck, jaw, teeth, or back.  You feel sweaty, dizzy, or light-headed.  You have chest pain or shortness of breath.  You vomit and your vomit looks like blood or coffee grounds.  Your stool is bloody or black.  You have a fever.  You cannot swallow, drink, or eat. Summary  Esophagitis is inflammation of the esophagus.  Most causes of esophagitis are not serious.  Follow your health care provider's instructions about eating  and drinking. Follow instructions on medicines.  Contact a health care provider if you have new symptoms, have weight loss, or coughing that does not stop.  Get help right away if you have severe pain in the arms, neck, jaw, teeth or back, or if you have chest pain, shortness of breath, or fever. This information is not intended to replace advice given to you by your health care provider. Make sure you discuss any questions you have with your health care provider. Document Revised: 04/22/2018 Document Reviewed: 04/22/2018 Elsevier Patient Education  [place].    Rehydration, Adult Rehydration is the replacement of body fluids and salts and minerals (electrolytes) that are lost during dehydration. Dehydration is when there is not enough fluid or water in the body. This happens when you lose more fluids than you take in. Common causes of dehydration include:  Vomiting.  Diarrhea.  Excessive sweating, such as from heat exposure or exercise.  Taking medicines that cause the body to lose excess fluid (diuretics).  Impaired kidney function.  Not drinking enough fluid.  Certain illnesses or infections.  Certain poorly controlled long-term (chronic) illnesses, such as diabetes, heart disease, and kidney disease.  Symptoms of mild dehydration may include thirst, dry lips and mouth,  dry skin, and dizziness. Symptoms of severe dehydration may include increased heart rate, confusion, fainting, and not urinating. You can rehydrate by drinking certain fluids or getting fluids through an IV tube, as told by your health care provider. What are the risks? Generally, rehydration is safe. However, one problem that can happen is taking in too much fluid (overhydration). This is rare. If overhydration happens, it can cause an electrolyte imbalance, kidney failure, or a decrease in salt (sodium) levels in the body. How to rehydrate Follow instructions from your health care provider for  rehydration. The kind of fluid you should drink and the amount you should drink depend on your condition.  If directed by your health care provider, drink an oral rehydration solution (ORS). This is a drink designed to treat dehydration that is found in pharmacies and retail stores. ? Make an ORS by following instructions on the package. ? Start by drinking small amounts, about  cup (120 mL) every 5-10 minutes. ? Slowly increase how much you drink until you have taken the amount recommended by your health care provider.  Drink enough clear fluids to keep your urine clear or pale yellow. If you were instructed to drink an ORS, finish the ORS first, then start slowly drinking other clear fluids. Drink fluids such as: ? Water. Do not drink only water. Doing that can lead to having too little sodium in your body (hyponatremia). ? Ice chips. ? Fruit juice that you have added water to (diluted juice). ? Low-calorie sports drinks.  If you are severely dehydrated, your health care provider may recommend that you receive fluids through an IV tube in the hospital.  Do not take sodium tablets. Doing that can lead to the condition of having too much sodium in your body (hypernatremia). Eating while you rehydrate Follow instructions from your health care provider about what to eat while you rehydrate. Your health care provider may recommend that you slowly begin eating regular foods in small amounts.  Eat foods that contain a healthy balance of electrolytes, such as bananas, oranges, potatoes, tomatoes, and spinach.  Avoid foods that are greasy or contain a lot of fat or sugar.  In some cases, you may get nutrition through a feeding tube that is passed through your nose and into your stomach (nasogastric tube, or NG tube). This may be done if you have uncontrolled vomiting or diarrhea. Beverages to avoid Certain beverages may make dehydration worse. While you rehydrate,  avoid:  Alcohol.  Caffeine.  Drinks that contain a lot of sugar. These include: ? High-calorie sports drinks. ? Fruit juice that is not diluted. ? Soda.  Check nutrition labels to see how much sugar or caffeine a beverage contains. Signs of dehydration recovery You may be recovering from dehydration if:  You are urinating more often than before you started rehydrating.  Your urine is clear or pale yellow.  Your energy level improves.  You vomit less frequently.  You have diarrhea less frequently.  Your appetite improves or returns to normal.  You feel less dizzy or less light-headed.  Your skin tone and color start to look more normal. Contact a health care provider if:  You continue to have symptoms of mild dehydration, such as: ? Thirst. ? Dry lips. ? Slightly dry mouth. ? Dry, warm skin. ? Dizziness.  You continue to vomit or have diarrhea. Get help right away if:  You have symptoms of dehydration that get worse.  You feel: ? Confused. ? Weak. ?  Like you are going to faint.  You have not urinated in 6-8 hours.  You have very dark urine.  You have trouble breathing.  Your heart rate while sitting still is over 100 beats a minute.  You cannot drink fluids without vomiting.  You have vomiting or diarrhea that: ? Gets worse. ? Does not go away.  You have a fever. This information is not intended to replace advice given to you by your health care provider. Make sure you discuss any questions you have with your health care provider. Document Revised: 08/13/2017 Document Reviewed: 10/25/2015 Elsevier Patient Education  2020 [employer].

## 2020-01-16 NOTE — Progress Notes (Signed)
The patient was seen today for a work in visit. She has completed 12 of her 15 fractions to her lumbar spine, pelvis, right proximal femur, and T8 spine.  Last week the patient started developing symptoms of esophagitis which we feel are related to the T8 site that is being treated.  She was offered Carafate but since that time has really struggled with keeping anything down and refuses to take any further medication orally.  She also describes difficulty with nausea at multiple times of the day, typically this is when eating but sometimes she wakes up with this.  She denies any visual changes or headaches or difficulties with movement.  No other complaints are verbalized but it has been several days since she is eaten any meaningful amount of food.   Physical exam Vitals will be recorded separately but her pulse was 98, oxygen level was 100% on room air, respiratory rate was 20, and her blood pressure was 112/78. In general this is a well-appearing African-American female in no acute distress she is alert and oriented x4 and appropriate throughout the examination, cardiopulmonary assessment reveals normal effort without any acute distress.  Impression/plan 1. Metastatic breast cancer with multifocal bone involvement.  The patient has started having significant symptoms as a result of her radiotherapy.  He is not willing to try other oral medications out of concern for the severity of her symptoms currently.  She is not sure whether or not she will want to complete the remainder of her radiotherapy sessions.  She did however take therapy before seeing me today.  I will follow-up with Dr. Ladona Mow as well and we decided to treat her symptoms today, trying to head off further risks of dehydration but performing lab work, administering a liter of normal saline over an hour up in the infusion room, and trying Phenergan by suppository as she is unwilling to try any further oral dissolving tablets.  I will reach out  to Dr. Payton Mccallum as well, though she did have a normal brain scan in February 2021, if her symptoms of nausea persist she may need a repeat MRI of her brain.  She and her mother are aware of this from our conversation.  Regarding her esophagitis she is going to reconsider Carafate possibly in the next couple days, but for now is not interested in any other oral medications to try and alleviate the symptoms.  Hopefully the symptoms will improve within the next week to where she can start eating and drinking regularly.       Carola Rhine, PAC

## 2020-01-16 NOTE — Telephone Encounter (Signed)
Left a voicemail via interpreter services for the patient to let her know that we received her blood work and all looks good.  Results have been forwarded to Dr. [name] as well.  Left my call back number in the event she has further questions.  Will continue to follow as necessary.  Gloriajean Dell. Leonie Green, BSN

## 2020-01-16 NOTE — Progress Notes (Signed)
Patient came around to the clinic following her radiation treatment with complaints of not being able to keep anything down since Sunday 01/14/2020.  She states that her throat is sore and if feels like everything is getting stuck on the way down.  She was prescribed carafate on Friday and states it does not work because everything comes back up.  Vitals were stable.  Spoke with PA Dara Lords who came in to speak with the patient.  It was decided that the patient would go for lab work, fluid replacement, and antiemetic would be sent in to the pharmacy.  Patient will evaluate how she feels in the morning and let us know about treatment.  Will continue to follow as necessary.  BP 112/78   Pulse 98   Temp 98 F (36.7 C)   Resp 20   LMP 07/09/2017 (LMP Unknown)   SpO2 100%    Erin [name], BSN

## 2020-01-17 ENCOUNTER — Other Ambulatory Visit: Payer: Self-pay

## 2020-01-17 ENCOUNTER — Ambulatory Visit
Admission: RE | Admit: 2020-01-17 | Discharge: 2020-01-17 | Disposition: A | Discharge status: Home / Self Care | Payer: Medicaid Other | Source: Ambulatory Visit | Attending: Radiation Oncology | Admitting: Radiation Oncology

## 2020-01-17 DIAGNOSIS — Z510 Encounter for antineoplastic radiation therapy: Secondary | ICD-10-CM | POA: Diagnosis not present

## 2020-01-17 NOTE — Progress Notes (Signed)
Patient Care Team: Antony Blackbird, MD as PCP - General (Family Medicine) Excell Seltzer, MD (Inactive) as Consulting Physician (General Surgery) [name], MD as Consulting Physician (Hematology and Oncology) Kyung Rudd, MD as Consulting Physician (Radiation Oncology) [name], MD as Consulting Physician (Gynecologic Oncology) Delice Bison, Charlestine Massed, NP as Nurse Practitioner (Hematology and Oncology) Mauro Kaufmann, RN as Oncology Nurse Navigator Rockwell Germany, RN as Oncology Nurse Navigator  DIAGNOSIS:    ICD-10-CM   1. 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 Malignant neoplasm of lower-outer quadrant of left breast of female, estrogen receptor negative ([place])  C50.512    Z17.1     SUMMARY OF ONCOLOGIC HISTORY: Oncology History  Malignant neoplasm of lower-outer quadrant of left breast of female, estrogen receptor negative ([place])  05/11/2017 Initial Diagnosis   Left breast biopsy 3:30 position 6 cm from nipple: IDC with DCIS, lymphovascular invasion present, grade 2-3, ER 0%, PR 0%, HER-2 positive ratio 2.28, Ki-67 20%, 1 cm lesion left breast, T1b N0 stage IA clinical stage   06/09/2017 Surgery   Left lumpectomy: IDC grade 3, 1.2 cm, DCIS is present, margins negative, 0/6 lymph nodes negative, ER 0%, PR 0%, HER-2 positive ratio 2.28, Ki-67 20%, T1 BN 0 stage IA   07/09/2017 - 09/29/2017 Adjuvant Chemotherapy   Taxol/Herceptin x 12 completed on 09/29/2017, went on to receive every 3 week adjuvant Herceptin through 06/28/2018   11/25/2017 - 01/07/2018 Radiation Therapy    The patient initially received a dose of 50.4 Gy in 28 fractions to the breast using whole-breast tangent fields. This was delivered using a 3-D conformal technique. The patient then received a boost to the seroma. This delivered an additional 10 Gy in 5 fractions using a 3-D technique. The total dose was 60.4 Gy.   02/15/2018 Genetic Testing   The Common Hereditary Cancer Panel offered  by [facility] includes sequencing and/or deletion duplication testing of the following 47 genes: APC, ATM, AXIN2, BARD1, BMPR1A, BRCA1, BRCA2, BRIP1, CDH1, CDKN2A (p14ARF), CDKN2A (p16INK4a), CKD4, CHEK2, CTNNA1, DICER1, EPCAM (Deletion/duplication testing only), GREM1 (promoter region deletion/duplication testing only), KIT, MEN1, MLH1, MSH2, MSH3, MSH6, MUTYH, NBN, NF1, NHTL1, PALB2, PDGFRA, PMS2, POLD1, POLE, PTEN, RAD50, RAD51C, RAD51D, SDHB, SDHC, SDHD, SMAD4, SMARCA4. STK11, TP53, TSC1, TSC2, and VHL.  The following genes were evaluated for sequence changes only: SDHA and HOXB13 c.251G>A variant only.   Results: POSITIVE. 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 The date of this test report is 02/15/2018   10/20/2019 PET scan   Bone mets involving Right hip, Bil Iliac bones, T8, T9, L2 and L3, Rt prox humerus, Righ glenoid. Some lytic and some sclerotic. Mild Left supraclav adenopathy, 2 small lesions basal ganglia (could be lacunar infarcts)   11/14/2019 - 01/15/2020 Chemotherapy   The patient had palonosetron (ALOXI) injection 0.25 mg, 0.25 mg, Intravenous,  Once, 2 of 5 cycles Administration: 0.25 mg (12/05/2019), 0.25 mg (12/26/2019) ado-trastuzumab emtansine (KADCYLA) 260 mg in sodium chloride 0.9 % 250 mL chemo infusion, 3.6 mg/kg = 260 mg, Intravenous, Once, 3 of 6 cycles Administration: 260 mg (11/14/2019), 260 mg (12/05/2019), 260 mg (12/26/2019)  for chemotherapy treatment.    01/08/2020 Miscellaneous   Talazoparib started 01/08/2020   Fallopian tube cancer, carcinoma, right ([place])  11/04/2017 Surgery   Uterus, cervix and bilateral fallopian tubes: Fallopian tube with serous carcinoma 0.6 cm positive for p53, PAX8, WT-1, MOC-31, cytokeratin 7, and estrogen receptor   01/11/2018 -  Chemotherapy   Taxol and  carboplatin every 3 weeks (Taxol discontinued with cycle 2 because of profound rash)   10/20/2019 PET scan   Bone mets involving Right hip, Bil Iliac bones, T8, T9, L2  and L3, Rt prox humerus, Righ glenoid. Some lytic and some sclerotic. Mild Left supraclav adenopathy, 2 small lesions basal ganglia (could be lacunar infarcts)   11/14/2019 - 01/15/2020 Chemotherapy   The patient had palonosetron (ALOXI) injection 0.25 mg, 0.25 mg, Intravenous,  Once, 2 of 5 cycles Administration: 0.25 mg (12/05/2019), 0.25 mg (12/26/2019) ado-trastuzumab emtansine (KADCYLA) 260 mg in sodium chloride 0.9 % 250 mL chemo infusion, 3.6 mg/kg = 260 mg, Intravenous, Once, 3 of 6 cycles Administration: 260 mg (11/14/2019), 260 mg (12/05/2019), 260 mg (12/26/2019)  for chemotherapy treatment.      CHIEF COMPLIANT: Follow-up ofmetastatic breast cancer   INTERVAL HISTORY: Erin [name] is a 59 y.o. with above-mentioned history of metastaticbreast cancerwith bone metastases,and fallopian tube cancer.She is currently on treatment with Talazoparib and Xgeva.She presents to the clinic todayfor treatment.  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  Because of this she is having severe esophagitis.  She is unable to swallow any water or food.  She appears to be losing weight significantly.  Her mother is extremely worried about her health situation.  She tells me that as soon as she puts water in the mouth it comes back up and she has difficulty with food getting down.  Tomorrow is the last day of radiation.  ALLERGIES:  has No Known Allergies.  MEDICATIONS:  Current Outpatient Medications  Medication Sig Dispense Refill  . Accu-Chek FastClix Lancets MISC Use as instructed. Inject into the skin twice daily 100 each 3  . atorvastatin (LIPITOR) 40 MG tablet Take 1 tablet (40 mg total) by mouth daily. 90 tablet 1  . Blood Glucose Calibration (ACCU-CHEK GUIDE CONTROL) LIQD 1 each by In Vitro route once as needed for up to 1 dose. 1 each 0  . cholecalciferol (VITAMIN D) 1000 units tablet Take 1,000 Units by mouth daily.    Marland Kitchen docusate sodium (COLACE) 100 MG capsule  Take 1 capsule (100 mg total) by mouth 2 (two) times daily. 60 capsule 3  . fentaNYL (DURAGESIC) 25 MCG/HR Place 1 patch onto the skin every 3 (three) days. 10 patch 0  . ferrous sulfate 325 (65 FE) MG tablet TAKE 1 TABLET (325 MG TOTAL) BY MOUTH DAILY BEFORE BREAKFAST. 90 tablet 1  . glimepiride (AMARYL) 2 MG tablet Take 1 tablet (2 mg total) by mouth daily with breakfast. To lower blood sugar (Patient taking differently: Take 2 mg by mouth daily with breakfast. To lower blood sugar) 90 tablet 3  . glucose blood (ACCU-CHEK GUIDE) test strip Use as instructed. Check blood glucose by fingerstick twice per day. 100 each 12  . Lancet Devices (ACCU-CHEK SOFTCLIX) lancets Use as instructed 1 each 0  . lisinopril (ZESTRIL) 10 MG tablet Take 1 tablet (10 mg total) by mouth daily. To lower blood pressure (Patient taking differently: Take 10 mg by mouth daily. To lower blood pressure) 90 tablet 3  . metFORMIN (GLUCOPHAGE-XR) 500 MG 24 hr tablet TAKE 2 TABLETS BY MOUTH EVERY DAY WITH BREAKFAST 180 tablet 0  . methocarbamol (ROBAXIN) 500 MG tablet Take 2 tablets (1,000 mg total) by mouth at bedtime as needed for muscle spasms. 60 tablet 0  . ondansetron (ZOFRAN ODT) 4 MG disintegrating tablet Take 1 tablet (4 mg total) by mouth every 8 (eight) hours as  needed for nausea or vomiting. 20 tablet 0  . pantoprazole (PROTONIX) 20 MG tablet Take 1 tablet (20 mg total) by mouth daily. 30 tablet 6  . promethazine (PHENERGAN) 12.5 MG suppository Place 1 suppository (12.5 mg total) rectally every 6 (six) hours as needed for nausea or vomiting. 12 each 0  . sucralfate (CARAFATE) 1 g tablet Take 1 tablet (1 g total) by mouth 4 (four) times daily. Dissolve each tablet in 15 cc water before use. 120 tablet 2  . talazoparib tosylate (TALZENNA) 1 MG capsule Take 1 capsule (1 mg total) by mouth daily. 30 capsule 3   No current facility-administered medications for this visit.    PHYSICAL EXAMINATION: ECOG PERFORMANCE STATUS:  3 - Symptomatic, >50% confined to bed  Vitals:   01/18/20 1207  BP: 126/78  Pulse: 93  Resp: 17  Temp: 98.9 F (37.2 C)  SpO2: 100%   Filed Weights   01/18/20 1207  Weight: 139 lb (63 kg)    LABORATORY DATA:  I have reviewed the data as listed CMP Latest Ref Rng & Units 01/16/2020 12/26/2019 12/05/2019  Glucose 70 - 99 mg/dL 101(H) 73 85  BUN 6 - 20 mg/dL 7 9 8   Creatinine 0.44 - 1.00 mg/dL 0.75 0.74 0.73  Sodium 135 - 145 mmol/L 138 137 142  Potassium 3.5 - 5.1 mmol/L 3.7 3.9 3.7  Chloride 98 - 111 mmol/L 101 101 105  CO2 22 - 32 mmol/L 26 24 26   Calcium 8.9 - 10.3 mg/dL 9.6 10.0 9.3  Total Protein 6.5 - 8.1 g/dL 8.7(H) 8.6(H) 7.7  Total Bilirubin 0.3 - 1.2 mg/dL 0.4 0.3 0.3  Alkaline Phos 38 - 126 U/L 102 102 93  AST 15 - 41 U/L 30 25 20   ALT 0 - 44 U/L 25 26 24     Lab Results  Component Value Date   WBC 6.3 01/16/2020   HGB 11.6 (L) 01/16/2020   HCT 35.7 (L) 01/16/2020   MCV 90.2 01/16/2020   PLT 130 (L) 01/16/2020   NEUTROABS 4.9 01/16/2020    ASSESSMENT & PLAN:  Malignant neoplasm of lower-outer quadrant of left breast of female, estrogen receptor negative (HCC) Left lumpectomy: IDC grade 3, 1.2 cm, DCIS is present, margins negative, 0/6 lymph nodes negative, ER 0%, PR 0%, HER-2 positive ratio 2.28, Ki-67 20%, T1 BN 0 stage IA S/P Taxol-Herceptin BRCA 2 Mutation Positive  10/20/19:Bone mets involving Right hip, Bil Iliac bones, T8, T9, L2 and L3, Rt prox humerus, Righ glenoid. Some lytic and some sclerotic. Mild Left supraclav adenopathy, 2 small lesions basal ganglia (could be lacunar infarcts)  Biopsy the supra clav LN: 10/31/2019: Metastatic breast cancer, ER/PR negative HER-2 equivocal by IHC, FISHpositive Treatment summary: Kadcyla every 3-week treatment.  Discontinued for progression ---------------------------------------------------------------------------------------------------------------------------------------------------------- Echocardiogram  11/14/2019 Current treatment:Talazoparib started 01/08/2020  Severe pain in the low back and right hip: Currently on Percocet. MRI of the back 01/07/2020: Extensive metastatic disease to the involved spine.  Tumor replacement of L3 vertebral body with pathologic compression fracture, epidural and extraosseous extension.  Tumor extending throughout L3 foramen.  CT chest abdomen pelvis 01/02/2020: Interval progression of bone metastases involving the spine and bony pelvis as well as the proximal right femur.  New multifocal low-density lesions in both lobes of the liver concerning for liver metastases.  Similar soft tissue infiltration left supraclavicular region.  L3 epidural extension of the tumor.  Rapid increase in Ca1 25: Coincided with the progression of disease noted on the imaging studies.  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  I recommend that she get IV fluids every day for the next 10 days. We will plan to start her on talazoparib only after her swallowing improves. I encouraged her to take boost or Ensure or ice cream to get some calories.  Return to clinic in 1 week for follow-up and labs.    No orders of the defined types were placed in this encounter.  The patient has a good understanding of the overall plan. she agrees with it. she will call with any problems that may develop before the next visit here.  Total time spent: 30 mins including face to face time and time spent for planning, charting and coordination of care  [name], MD 01/18/2020  I, Cloyde Reams Dorshimer, am acting as scribe for [name].  I have reviewed the above documentation for accuracy and completeness, and I agree with the above.

## 2020-01-18 ENCOUNTER — Inpatient Hospital Stay (HOSPITAL_BASED_OUTPATIENT_CLINIC_OR_DEPARTMENT_OTHER): Payer: Medicaid Other | Admitting: Hematology and Oncology

## 2020-01-18 ENCOUNTER — Other Ambulatory Visit: Payer: Self-pay

## 2020-01-18 ENCOUNTER — Inpatient Hospital Stay: Payer: Medicaid Other

## 2020-01-18 ENCOUNTER — Ambulatory Visit
Admission: RE | Admit: 2020-01-18 | Discharge: 2020-01-18 | Disposition: A | Discharge status: Home / Self Care | Payer: Medicaid Other | Source: Ambulatory Visit | Attending: Radiation Oncology | Admitting: Radiation Oncology

## 2020-01-18 VITALS — BP 126/78 | HR 93 | Temp 98.9°F | Resp 17 | Ht 61.0 in | Wt 139.0 lb

## 2020-01-18 DIAGNOSIS — C50919 Malignant neoplasm of unspecified site of unspecified female breast: Secondary | ICD-10-CM

## 2020-01-18 DIAGNOSIS — Z95828 Presence of other vascular implants and grafts: Secondary | ICD-10-CM

## 2020-01-18 DIAGNOSIS — C50512 Malignant neoplasm of lower-outer quadrant of left female breast: Secondary | ICD-10-CM | POA: Diagnosis not present

## 2020-01-18 DIAGNOSIS — C5701 Malignant neoplasm of right fallopian tube: Secondary | ICD-10-CM

## 2020-01-18 DIAGNOSIS — Z171 Estrogen receptor negative status [ER-]: Secondary | ICD-10-CM | POA: Diagnosis not present

## 2020-01-18 DIAGNOSIS — Z510 Encounter for antineoplastic radiation therapy: Secondary | ICD-10-CM | POA: Diagnosis not present

## 2020-01-18 MED ORDER — DENOSUMAB 120 MG/1.7ML ~~LOC~~ SOLN
120.0000 mg | Freq: Once | SUBCUTANEOUS | Status: AC
  Administered 2020-01-18: 120 mg via SUBCUTANEOUS

## 2020-01-18 MED ORDER — HEPARIN SOD (PORK) LOCK FLUSH 100 UNIT/ML IV SOLN
500.0000 [IU] | Freq: Once | INTRAVENOUS | Status: AC | PRN
  Administered 2020-01-18: 500 [IU]
  Filled 2020-01-18: qty 5

## 2020-01-18 MED ORDER — DENOSUMAB 120 MG/1.7ML ~~LOC~~ SOLN
SUBCUTANEOUS | Status: AC
  Filled 2020-01-18: qty 1.7

## 2020-01-18 MED ORDER — SODIUM CHLORIDE 0.9% FLUSH
10.0000 mL | INTRAVENOUS | Status: DC | PRN
  Administered 2020-01-18: 10 mL
  Filled 2020-01-18: qty 10

## 2020-01-18 MED ORDER — SODIUM CHLORIDE 0.9 % IV SOLN
INTRAVENOUS | Status: DC
  Filled 2020-01-18 (×2): qty 250

## 2020-01-18 NOTE — Assessment & Plan Note (Addendum)
Left lumpectomy: IDC grade 3, 1.2 cm, DCIS is present, margins negative, 0/6 lymph nodes negative, ER 0%, PR 0%, HER-2 positive ratio 2.28, Ki-67 20%, T1 BN 0 stage IA S/P Taxol-Herceptin BRCA 2 Mutation Positive  10/20/19:Bone mets involving Right hip, Bil Iliac bones, T8, T9, L2 and L3, Rt prox humerus, Righ glenoid. Some lytic and some sclerotic. Mild Left supraclav adenopathy, 2 small lesions basal ganglia (could be lacunar infarcts)  Biopsy the supra clav LN: 10/31/2019: Metastatic breast cancer, ER/PR negative HER-2 equivocal by IHC, FISHpositive Treatment summary: Kadcyla every 3-week treatment.  Discontinued for progression ---------------------------------------------------------------------------------------------------------------------------------------------------------- Echocardiogram 11/14/2019 Current treatment:Talazoparib started 01/08/2020  Severe pain in the low back and right hip: Currently on Percocet. MRI of the back 01/07/2020: Extensive metastatic disease to the involved spine.  Tumor replacement of L3 vertebral body with pathologic compression fracture, epidural and extraosseous extension.  Tumor extending throughout L3 foramen.  CT chest abdomen pelvis 01/02/2020: Interval progression of bone metastases involving the spine and bony pelvis as well as the proximal right femur.  New multifocal low-density lesions in both lobes of the liver concerning for liver metastases.  Similar soft tissue infiltration left supraclavicular region.  L3 epidural extension of the tumor.  Rapid increase in Ca1 25: Coincided with the progression of disease noted on the imaging studies.  Recommendation: Talazoparib started 01/08/2020  Talazoparib toxicities:

## 2020-01-18 NOTE — Patient Instructions (Signed)
Denosumab injection What is this medicine? DENOSUMAB (den oh sue mab) slows bone breakdown. Prolia is used to treat osteoporosis in women after menopause and in men, and in people who are taking corticosteroids for 6 months or more. Xgeva is used to treat a high calcium level due to cancer and to prevent bone fractures and other bone problems caused by multiple myeloma or cancer bone metastases. Xgeva is also used to treat giant cell tumor of the bone. This medicine may be used for other purposes; ask your health care provider or pharmacist if you have questions. COMMON BRAND NAME(S): Prolia, XGEVA What should I tell my health care provider before I take this medicine? They need to know if you have any of these conditions:  dental disease  having surgery or tooth extraction  infection  kidney disease  low levels of calcium or Vitamin D in the blood  malnutrition  on hemodialysis  skin conditions or sensitivity  thyroid or parathyroid disease  an unusual reaction to denosumab, other medicines, foods, dyes, or preservatives  pregnant or trying to get pregnant  breast-feeding How should I use this medicine? This medicine is for injection under the skin. It is given by a health care professional in a hospital or clinic setting. A special MedGuide will be given to you before each treatment. Be sure to read this information carefully each time. For Prolia, talk to your pediatrician regarding the use of this medicine in children. Special care may be needed. For Xgeva, talk to your pediatrician regarding the use of this medicine in children. While this drug may be prescribed for children as young as 13 years for selected conditions, precautions do apply. Overdosage: If you think you have taken too much of this medicine contact a poison control center or emergency room at once. NOTE: This medicine is only for you. Do not share this medicine with others. What if I miss a dose? It is  important not to miss your dose. Call your doctor or health care professional if you are unable to keep an appointment. What may interact with this medicine? Do not take this medicine with any of the following medications:  other medicines containing denosumab This medicine may also interact with the following medications:  medicines that lower your chance of fighting infection  steroid medicines like prednisone or cortisone This list may not describe all possible interactions. Give your health care provider a list of all the medicines, herbs, non-prescription drugs, or dietary supplements you use. Also tell them if you smoke, drink alcohol, or use illegal drugs. Some items may interact with your medicine. What should I watch for while using this medicine? Visit your doctor or health care professional for regular checks on your progress. Your doctor or health care professional may order blood tests and other tests to see how you are doing. Call your doctor or health care professional for advice if you get a fever, chills or sore throat, or other symptoms of a cold or flu. Do not treat yourself. This drug may decrease your body's ability to fight infection. Try to avoid being around people who are sick. You should make sure you get enough calcium and vitamin D while you are taking this medicine, unless your doctor tells you not to. Discuss the foods you eat and the vitamins you take with your health care professional. See your dentist regularly. Brush and floss your teeth as directed. Before you have any dental work done, tell your dentist you are   receiving this medicine. Do not become pregnant while taking this medicine or for 5 months after stopping it. Talk with your doctor or health care professional about your birth control options while taking this medicine. Women should inform their doctor if they wish to become pregnant or think they might be pregnant. There is a potential for serious side  effects to an unborn child. Talk to your health care professional or pharmacist for more information. What side effects may I notice from receiving this medicine? Side effects that you should report to your doctor or health care professional as soon as possible:  allergic reactions like skin rash, itching or hives, swelling of the face, lips, or tongue  bone pain  breathing problems  dizziness  jaw pain, especially after dental work  redness, blistering, peeling of the skin  signs and symptoms of infection like fever or chills; cough; sore throat; pain or trouble passing urine  signs of low calcium like fast heartbeat, muscle cramps or muscle pain; pain, tingling, numbness in the hands or feet; seizures  unusual bleeding or bruising  unusually weak or tired Side effects that usually do not require medical attention (report to your doctor or health care professional if they continue or are bothersome):  constipation  diarrhea  headache  joint pain  loss of appetite  muscle pain  runny nose  tiredness  upset stomach This list may not describe all possible side effects. Call your doctor for medical advice about side effects. You may report side effects to FDA at 1-800-FDA-1088. Where should I keep my medicine? This medicine is only given in a clinic, doctor's office, or other health care setting and will not be stored at home. NOTE: This sheet is a summary. It may not cover all possible information. If you have questions about this medicine, talk to your doctor, pharmacist, or health care provider.  2020 Elsevier/Gold Standard (2018-01-07 16:10:44)   Rehydration, Adult Rehydration is the replacement of body fluids and salts and minerals (electrolytes) that are lost during dehydration. Dehydration is when there is not enough fluid or water in the body. This happens when you lose more fluids than you take in. Common causes of dehydration  include:  Vomiting.  Diarrhea.  Excessive sweating, such as from heat exposure or exercise.  Taking medicines that cause the body to lose excess fluid (diuretics).  Impaired kidney function.  Not drinking enough fluid.  Certain illnesses or infections.  Certain poorly controlled long-term (chronic) illnesses, such as diabetes, heart disease, and kidney disease.  Symptoms of mild dehydration may include thirst, dry lips and mouth, dry skin, and dizziness. Symptoms of severe dehydration may include increased heart rate, confusion, fainting, and not urinating. You can rehydrate by drinking certain fluids or getting fluids through an IV tube, as told by your health care provider. What are the risks? Generally, rehydration is safe. However, one problem that can happen is taking in too much fluid (overhydration). This is rare. If overhydration happens, it can cause an electrolyte imbalance, kidney failure, or a decrease in salt (sodium) levels in the body. How to rehydrate Follow instructions from your health care provider for rehydration. The kind of fluid you should drink and the amount you should drink depend on your condition.  If directed by your health care provider, drink an oral rehydration solution (ORS). This is a drink designed to treat dehydration that is found in pharmacies and retail stores. ? Make an ORS by following instructions on the package. ?  Start by drinking small amounts, about  cup (120 mL) every 5-10 minutes. ? Slowly increase how much you drink until you have taken the amount recommended by your health care provider.  Drink enough clear fluids to keep your urine clear or pale yellow. If you were instructed to drink an ORS, finish the ORS first, then start slowly drinking other clear fluids. Drink fluids such as: ? Water. Do not drink only water. Doing that can lead to having too little sodium in your body (hyponatremia). ? Ice chips. ? Fruit juice that you have  added water to (diluted juice). ? Low-calorie sports drinks.  If you are severely dehydrated, your health care provider may recommend that you receive fluids through an IV tube in the hospital.  Do not take sodium tablets. Doing that can lead to the condition of having too much sodium in your body (hypernatremia). Eating while you rehydrate Follow instructions from your health care provider about what to eat while you rehydrate. Your health care provider may recommend that you slowly begin eating regular foods in small amounts.  Eat foods that contain a healthy balance of electrolytes, such as bananas, oranges, potatoes, tomatoes, and spinach.  Avoid foods that are greasy or contain a lot of fat or sugar.  In some cases, you may get nutrition through a feeding tube that is passed through your nose and into your stomach (nasogastric tube, or NG tube). This may be done if you have uncontrolled vomiting or diarrhea. Beverages to avoid Certain beverages may make dehydration worse. While you rehydrate, avoid:  Alcohol.  Caffeine.  Drinks that contain a lot of sugar. These include: ? High-calorie sports drinks. ? Fruit juice that is not diluted. ? Soda.  Check nutrition labels to see how much sugar or caffeine a beverage contains. Signs of dehydration recovery You may be recovering from dehydration if:  You are urinating more often than before you started rehydrating.  Your urine is clear or pale yellow.  Your energy level improves.  You vomit less frequently.  You have diarrhea less frequently.  Your appetite improves or returns to normal.  You feel less dizzy or less light-headed.  Your skin tone and color start to look more normal. Contact a health care provider if:  You continue to have symptoms of mild dehydration, such as: ? Thirst. ? Dry lips. ? Slightly dry mouth. ? Dry, warm skin. ? Dizziness.  You continue to vomit or have diarrhea. Get help right away  if:  You have symptoms of dehydration that get worse.  You feel: ? Confused. ? Weak. ? Like you are going to faint.  You have not urinated in 6-8 hours.  You have very dark urine.  You have trouble breathing.  Your heart rate while sitting still is over 100 beats a minute.  You cannot drink fluids without vomiting.  You have vomiting or diarrhea that: ? Gets worse. ? Does not go away.  You have a fever. This information is not intended to replace advice given to you by your health care provider. Make sure you discuss any questions you have with your health care provider. Document Revised: 08/13/2017 Document Reviewed: 10/25/2015 Elsevier Patient Education  2020 [employer].

## 2020-01-19 ENCOUNTER — Telehealth: Payer: Self-pay | Admitting: Hematology and Oncology

## 2020-01-19 ENCOUNTER — Encounter: Payer: Self-pay | Admitting: Radiation Oncology

## 2020-01-19 ENCOUNTER — Ambulatory Visit
Admission: RE | Admit: 2020-01-19 | Discharge: 2020-01-19 | Disposition: A | Discharge status: Home / Self Care | Payer: Medicaid Other | Source: Ambulatory Visit | Attending: Radiation Oncology | Admitting: Radiation Oncology

## 2020-01-19 ENCOUNTER — Inpatient Hospital Stay: Payer: Medicaid Other

## 2020-01-19 ENCOUNTER — Other Ambulatory Visit: Payer: Self-pay

## 2020-01-19 VITALS — BP 125/67 | HR 92 | Temp 98.7°F | Resp 18 | Ht 61.0 in | Wt 140.8 lb

## 2020-01-19 DIAGNOSIS — C50919 Malignant neoplasm of unspecified site of unspecified female breast: Secondary | ICD-10-CM

## 2020-01-19 DIAGNOSIS — Z510 Encounter for antineoplastic radiation therapy: Secondary | ICD-10-CM | POA: Diagnosis not present

## 2020-01-19 DIAGNOSIS — Z95828 Presence of other vascular implants and grafts: Secondary | ICD-10-CM

## 2020-01-19 DIAGNOSIS — C7951 Secondary malignant neoplasm of bone: Secondary | ICD-10-CM | POA: Diagnosis not present

## 2020-01-19 DIAGNOSIS — R110 Nausea: Secondary | ICD-10-CM

## 2020-01-19 DIAGNOSIS — K219 Gastro-esophageal reflux disease without esophagitis: Secondary | ICD-10-CM

## 2020-01-19 MED ORDER — LIDOCAINE VISCOUS HCL 2 % MT SOLN
15.0000 mL | Freq: Once | OROMUCOSAL | Status: AC
  Administered 2020-01-19: 15 mL via ORAL

## 2020-01-19 MED ORDER — ONDANSETRON HCL 4 MG/2ML IJ SOLN
8.0000 mg | Freq: Once | INTRAMUSCULAR | Status: AC
  Administered 2020-01-19: 8 mg via INTRAVENOUS

## 2020-01-19 MED ORDER — SODIUM CHLORIDE 0.9% FLUSH
10.0000 mL | INTRAVENOUS | Status: AC | PRN
  Filled 2020-01-19: qty 10

## 2020-01-19 MED ORDER — ONDANSETRON HCL 4 MG/2ML IJ SOLN
INTRAMUSCULAR | Status: AC
  Filled 2020-01-19: qty 4

## 2020-01-19 MED ORDER — ALUM & MAG HYDROXIDE-SIMETH 200-200-20 MG/5ML PO SUSP
30.0000 mL | Freq: Once | ORAL | Status: AC
  Administered 2020-01-19: 30 mL via ORAL

## 2020-01-19 MED ORDER — SODIUM CHLORIDE 0.9 % IV SOLN
INTRAVENOUS | Status: AC
  Filled 2020-01-19 (×2): qty 250

## 2020-01-19 MED ORDER — ALUM & MAG HYDROXIDE-SIMETH 200-200-20 MG/5ML PO SUSP
ORAL | Status: AC
  Filled 2020-01-19: qty 30

## 2020-01-19 MED ORDER — LIDOCAINE VISCOUS HCL 2 % MT SOLN
OROMUCOSAL | Status: AC
  Filled 2020-01-19: qty 15

## 2020-01-19 MED ORDER — HEPARIN SOD (PORK) LOCK FLUSH 100 UNIT/ML IV SOLN
500.0000 [IU] | Freq: Once | INTRAVENOUS | Status: AC | PRN
  Filled 2020-01-19: qty 5

## 2020-01-19 MED ORDER — LIDOCAINE VISCOUS HCL 2 % MT SOLN
OROMUCOSAL | 1 refills | Status: DC
Start: 2020-01-19 — End: 2020-02-03

## 2020-01-19 NOTE — Progress Notes (Unsigned)
Ok per MD Gudena to run IVF at 750 ml/hr.  Tolerated well.  VSS.  Declined anything to eat/drink or to use restroom during tx.

## 2020-01-19 NOTE — Telephone Encounter (Signed)
Scheduled per 05/06 los, called patient and left a voicemail.

## 2020-01-19 NOTE — Patient Instructions (Signed)

## 2020-01-20 ENCOUNTER — Inpatient Hospital Stay: Payer: Medicaid Other

## 2020-01-20 VITALS — BP 103/58 | HR 79 | Temp 98.5°F | Resp 16

## 2020-01-20 DIAGNOSIS — C7951 Secondary malignant neoplasm of bone: Secondary | ICD-10-CM | POA: Insufficient documentation

## 2020-01-20 DIAGNOSIS — Z95828 Presence of other vascular implants and grafts: Secondary | ICD-10-CM

## 2020-01-20 DIAGNOSIS — C5700 Malignant neoplasm of unspecified fallopian tube: Secondary | ICD-10-CM | POA: Insufficient documentation

## 2020-01-20 DIAGNOSIS — C50512 Malignant neoplasm of lower-outer quadrant of left female breast: Secondary | ICD-10-CM | POA: Insufficient documentation

## 2020-01-20 DIAGNOSIS — R110 Nausea: Secondary | ICD-10-CM | POA: Diagnosis not present

## 2020-01-20 DIAGNOSIS — Z171 Estrogen receptor negative status [ER-]: Secondary | ICD-10-CM | POA: Insufficient documentation

## 2020-01-20 DIAGNOSIS — C5701 Malignant neoplasm of right fallopian tube: Secondary | ICD-10-CM | POA: Diagnosis present

## 2020-01-20 DIAGNOSIS — Z79899 Other long term (current) drug therapy: Secondary | ICD-10-CM | POA: Diagnosis not present

## 2020-01-20 DIAGNOSIS — M545 Low back pain: Secondary | ICD-10-CM | POA: Insufficient documentation

## 2020-01-20 DIAGNOSIS — Z7984 Long term (current) use of oral hypoglycemic drugs: Secondary | ICD-10-CM | POA: Diagnosis not present

## 2020-01-20 DIAGNOSIS — K209 Esophagitis, unspecified without bleeding: Secondary | ICD-10-CM | POA: Insufficient documentation

## 2020-01-20 DIAGNOSIS — R599 Enlarged lymph nodes, unspecified: Secondary | ICD-10-CM | POA: Insufficient documentation

## 2020-01-20 DIAGNOSIS — C50919 Malignant neoplasm of unspecified site of unspecified female breast: Secondary | ICD-10-CM

## 2020-01-20 MED ORDER — SODIUM CHLORIDE 0.9 % IV SOLN
INTRAVENOUS | Status: DC
  Filled 2020-01-20 (×2): qty 250

## 2020-01-20 MED ORDER — SODIUM CHLORIDE 0.9% FLUSH
10.0000 mL | INTRAVENOUS | Status: DC | PRN
  Administered 2020-01-20: 10 mL
  Filled 2020-01-20: qty 10

## 2020-01-20 MED ORDER — HEPARIN SOD (PORK) LOCK FLUSH 100 UNIT/ML IV SOLN
500.0000 [IU] | Freq: Once | INTRAVENOUS | Status: AC | PRN
  Administered 2020-01-20: 500 [IU]
  Filled 2020-01-20: qty 5

## 2020-01-22 ENCOUNTER — Inpatient Hospital Stay: Payer: Medicaid Other

## 2020-01-22 ENCOUNTER — Other Ambulatory Visit: Payer: Self-pay

## 2020-01-22 VITALS — BP 110/69 | HR 98 | Temp 99.1°F | Resp 18

## 2020-01-22 DIAGNOSIS — C50919 Malignant neoplasm of unspecified site of unspecified female breast: Secondary | ICD-10-CM

## 2020-01-22 DIAGNOSIS — Z95828 Presence of other vascular implants and grafts: Secondary | ICD-10-CM

## 2020-01-22 DIAGNOSIS — C5701 Malignant neoplasm of right fallopian tube: Secondary | ICD-10-CM | POA: Diagnosis not present

## 2020-01-22 MED ORDER — SODIUM CHLORIDE 0.9% FLUSH
10.0000 mL | INTRAVENOUS | Status: DC | PRN
  Administered 2020-01-22: 10 mL
  Filled 2020-01-22: qty 10

## 2020-01-22 MED ORDER — HEPARIN SOD (PORK) LOCK FLUSH 100 UNIT/ML IV SOLN
500.0000 [IU] | Freq: Once | INTRAVENOUS | Status: AC | PRN
  Administered 2020-01-22: 500 [IU]
  Filled 2020-01-22: qty 5

## 2020-01-22 MED ORDER — SODIUM CHLORIDE 0.9 % IV SOLN
INTRAVENOUS | Status: AC
  Filled 2020-01-22 (×2): qty 250

## 2020-01-22 NOTE — Patient Instructions (Signed)

## 2020-01-23 ENCOUNTER — Other Ambulatory Visit: Payer: Self-pay

## 2020-01-23 ENCOUNTER — Inpatient Hospital Stay: Payer: Medicaid Other

## 2020-01-23 ENCOUNTER — Inpatient Hospital Stay (HOSPITAL_BASED_OUTPATIENT_CLINIC_OR_DEPARTMENT_OTHER): Payer: Medicaid Other | Admitting: Medical

## 2020-01-23 DIAGNOSIS — C7951 Secondary malignant neoplasm of bone: Secondary | ICD-10-CM

## 2020-01-23 DIAGNOSIS — C50512 Malignant neoplasm of lower-outer quadrant of left female breast: Secondary | ICD-10-CM

## 2020-01-23 DIAGNOSIS — R110 Nausea: Secondary | ICD-10-CM

## 2020-01-23 DIAGNOSIS — C5701 Malignant neoplasm of right fallopian tube: Secondary | ICD-10-CM

## 2020-01-23 DIAGNOSIS — R112 Nausea with vomiting, unspecified: Secondary | ICD-10-CM

## 2020-01-23 DIAGNOSIS — C770 Secondary and unspecified malignant neoplasm of lymph nodes of head, face and neck: Secondary | ICD-10-CM

## 2020-01-23 DIAGNOSIS — C50919 Malignant neoplasm of unspecified site of unspecified female breast: Secondary | ICD-10-CM

## 2020-01-23 DIAGNOSIS — Z171 Estrogen receptor negative status [ER-]: Secondary | ICD-10-CM

## 2020-01-23 MED ORDER — ONDANSETRON HCL 4 MG/2ML IJ SOLN: 8.0000 mg | Freq: Once | INTRAMUSCULAR | Status: DC

## 2020-01-23 MED ORDER — SODIUM CHLORIDE 0.9 % IV SOLN
INTRAVENOUS | Status: DC
  Filled 2020-01-23 (×2): qty 250

## 2020-01-23 MED ORDER — SODIUM CHLORIDE 0.9 % IV SOLN
16.0000 mg | Freq: Once | INTRAVENOUS | Status: AC
  Administered 2020-01-23: 16 mg via INTRAVENOUS
  Filled 2020-01-23: qty 8

## 2020-01-23 NOTE — Progress Notes (Signed)
Erin [name] was seen in the infusion room with her interpreter.  She continues to have nausea and vomiting with almost all p.o. intake.  She is receiving IV fluids and will receive IV fluids tomorrow.  She has tried both sublingual, rectal, and transdermal antiemetics without benefit.  She reports that transdermal antiemetics make her dizzy.Marland Kitchen  She had one episode of slight hematemesis after vomiting repeatedly.  This has not recurred.  She reports having pain in her chest with swallowing.  She has tried viscous lidocaine, Mylanta, and Carafate all of which make her vomit.  She is agreeable to be given IV antiemetics today and to try sublingual antiemetics when she gets home in hopes that her nausea and vomiting can be controlled somewhat.  Antiemetics have been ordered for her again tomorrow.  I will assess her again tomorrow when she comes for fluids.  Sandi Mealy, MHS, PA-C  Physician Assistant

## 2020-01-23 NOTE — Patient Instructions (Signed)

## 2020-01-24 ENCOUNTER — Inpatient Hospital Stay (HOSPITAL_BASED_OUTPATIENT_CLINIC_OR_DEPARTMENT_OTHER): Payer: Medicaid Other | Admitting: Medical

## 2020-01-24 ENCOUNTER — Inpatient Hospital Stay: Payer: Medicaid Other

## 2020-01-24 ENCOUNTER — Other Ambulatory Visit: Payer: Self-pay

## 2020-01-24 VITALS — BP 105/60 | HR 86 | Resp 18

## 2020-01-24 DIAGNOSIS — C5701 Malignant neoplasm of right fallopian tube: Secondary | ICD-10-CM | POA: Diagnosis not present

## 2020-01-24 DIAGNOSIS — C7951 Secondary malignant neoplasm of bone: Secondary | ICD-10-CM

## 2020-01-24 DIAGNOSIS — Z95828 Presence of other vascular implants and grafts: Secondary | ICD-10-CM

## 2020-01-24 DIAGNOSIS — C50919 Malignant neoplasm of unspecified site of unspecified female breast: Secondary | ICD-10-CM

## 2020-01-24 DIAGNOSIS — Z171 Estrogen receptor negative status [ER-]: Secondary | ICD-10-CM

## 2020-01-24 DIAGNOSIS — C50512 Malignant neoplasm of lower-outer quadrant of left female breast: Secondary | ICD-10-CM

## 2020-01-24 MED ORDER — SODIUM CHLORIDE 0.9% FLUSH
10.0000 mL | INTRAVENOUS | Status: DC | PRN
  Administered 2020-01-24: 10 mL
  Filled 2020-01-24: qty 10

## 2020-01-24 MED ORDER — SODIUM CHLORIDE 0.9 % IV SOLN
INTRAVENOUS | Status: AC
  Filled 2020-01-24 (×2): qty 250

## 2020-01-24 MED ORDER — ONDANSETRON HCL 4 MG/2ML IJ SOLN
INTRAMUSCULAR | Status: AC
  Filled 2020-01-24: qty 4

## 2020-01-24 MED ORDER — HEPARIN SOD (PORK) LOCK FLUSH 100 UNIT/ML IV SOLN
500.0000 [IU] | Freq: Once | INTRAVENOUS | Status: AC | PRN
  Administered 2020-01-24: 17:00:00 500 [IU]
  Filled 2020-01-24: qty 5

## 2020-01-24 MED ORDER — ONDANSETRON HCL 4 MG/2ML IJ SOLN
8.0000 mg | Freq: Once | INTRAMUSCULAR | Status: AC
  Administered 2020-01-24: 16:00:00 8 mg via INTRAVENOUS

## 2020-01-24 NOTE — Patient Instructions (Signed)

## 2020-01-25 ENCOUNTER — Other Ambulatory Visit: Payer: Self-pay | Admitting: *Deleted

## 2020-01-25 ENCOUNTER — Other Ambulatory Visit: Payer: Self-pay

## 2020-01-25 ENCOUNTER — Inpatient Hospital Stay: Payer: Medicaid Other

## 2020-01-25 VITALS — BP 110/67 | HR 79 | Temp 98.2°F | Resp 18

## 2020-01-25 DIAGNOSIS — Z95828 Presence of other vascular implants and grafts: Secondary | ICD-10-CM

## 2020-01-25 DIAGNOSIS — R110 Nausea: Secondary | ICD-10-CM

## 2020-01-25 DIAGNOSIS — C50919 Malignant neoplasm of unspecified site of unspecified female breast: Secondary | ICD-10-CM

## 2020-01-25 DIAGNOSIS — C5701 Malignant neoplasm of right fallopian tube: Secondary | ICD-10-CM | POA: Diagnosis not present

## 2020-01-25 MED ORDER — SODIUM CHLORIDE 0.9 % IV SOLN
Freq: Once | INTRAVENOUS | Status: AC
  Filled 2020-01-25: qty 250

## 2020-01-25 MED ORDER — SODIUM CHLORIDE 0.9% FLUSH
10.0000 mL | INTRAVENOUS | Status: DC | PRN
  Administered 2020-01-25: 10 mL
  Filled 2020-01-25: qty 10

## 2020-01-25 MED ORDER — ALTEPLASE 2 MG IJ SOLR
2.0000 mg | Freq: Once | INTRAMUSCULAR | Status: DC | PRN
  Filled 2020-01-25: qty 2

## 2020-01-25 MED ORDER — HEPARIN SOD (PORK) LOCK FLUSH 100 UNIT/ML IV SOLN
500.0000 [IU] | Freq: Once | INTRAVENOUS | Status: AC | PRN
  Administered 2020-01-25: 500 [IU]
  Filled 2020-01-25: qty 5

## 2020-01-25 NOTE — Patient Instructions (Signed)

## 2020-01-25 NOTE — Progress Notes (Signed)
Patient Care Team: Antony Blackbird, MD as PCP - General (Family Medicine) Excell Seltzer, MD (Inactive) as Consulting Physician (General Surgery) [name], MD as Consulting Physician (Hematology and Oncology) Kyung Rudd, MD as Consulting Physician (Radiation Oncology) [name], MD as Consulting Physician (Gynecologic Oncology) Delice Bison, Charlestine Massed, NP as Nurse Practitioner (Hematology and Oncology) Mauro Kaufmann, RN as Oncology Nurse Navigator Rockwell Germany, RN as Oncology Nurse Navigator  DIAGNOSIS:    ICD-10-CM   1. 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 Bone metastases ([place])  C79.51   3. Malignant neoplasm of lower-outer quadrant of left breast of female, estrogen receptor negative ([place])  C50.512    Z17.1     SUMMARY OF ONCOLOGIC HISTORY: Oncology History  Malignant neoplasm of lower-outer quadrant of left breast of female, estrogen receptor negative ([place])  05/11/2017 Initial Diagnosis   Left breast biopsy 3:30 position 6 cm from nipple: IDC with DCIS, lymphovascular invasion present, grade 2-3, ER 0%, PR 0%, HER-2 positive ratio 2.28, Ki-67 20%, 1 cm lesion left breast, T1b N0 stage IA clinical stage   06/09/2017 Surgery   Left lumpectomy: IDC grade 3, 1.2 cm, DCIS is present, margins negative, 0/6 lymph nodes negative, ER 0%, PR 0%, HER-2 positive ratio 2.28, Ki-67 20%, T1 BN 0 stage IA   07/09/2017 - 09/29/2017 Adjuvant Chemotherapy   Taxol/Herceptin x 12 completed on 09/29/2017, went on to receive every 3 week adjuvant Herceptin through 06/28/2018   11/25/2017 - 01/07/2018 Radiation Therapy    The patient initially received a dose of 50.4 Gy in 28 fractions to the breast using whole-breast tangent fields. This was delivered using a 3-D conformal technique. The patient then received a boost to the seroma. This delivered an additional 10 Gy in 5 fractions using a 3-D technique. The total dose was 60.4 Gy.   02/15/2018 Genetic Testing   The  Common Hereditary Cancer Panel offered by [facility] includes sequencing and/or deletion duplication testing of the following 47 genes: APC, ATM, AXIN2, BARD1, BMPR1A, BRCA1, BRCA2, BRIP1, CDH1, CDKN2A (p14ARF), CDKN2A (p16INK4a), CKD4, CHEK2, CTNNA1, DICER1, EPCAM (Deletion/duplication testing only), GREM1 (promoter region deletion/duplication testing only), KIT, MEN1, MLH1, MSH2, MSH3, MSH6, MUTYH, NBN, NF1, NHTL1, PALB2, PDGFRA, PMS2, POLD1, POLE, PTEN, RAD50, RAD51C, RAD51D, SDHB, SDHC, SDHD, SMAD4, SMARCA4. STK11, TP53, TSC1, TSC2, and VHL.  The following genes were evaluated for sequence changes only: SDHA and HOXB13 c.251G>A variant only.   Results: POSITIVE. 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 The date of this test report is 02/15/2018   10/20/2019 PET scan   Bone mets involving Right hip, Bil Iliac bones, T8, T9, L2 and L3, Rt prox humerus, Righ glenoid. Some lytic and some sclerotic. Mild Left supraclav adenopathy, 2 small lesions basal ganglia (could be lacunar infarcts)   11/14/2019 - 01/15/2020 Chemotherapy   The patient had palonosetron (ALOXI) injection 0.25 mg, 0.25 mg, Intravenous,  Once, 2 of 5 cycles Administration: 0.25 mg (12/05/2019), 0.25 mg (12/26/2019) ado-trastuzumab emtansine (KADCYLA) 260 mg in sodium chloride 0.9 % 250 mL chemo infusion, 3.6 mg/kg = 260 mg, Intravenous, Once, 3 of 6 cycles Administration: 260 mg (11/14/2019), 260 mg (12/05/2019), 260 mg (12/26/2019)  for chemotherapy treatment.    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 Some lytic and some sclerotic. Mild Left supraclav adenopathy, 2 small lesions basal ganglia (could be lacunar infarcts)   11/14/2019 - 01/15/2020 Chemotherapy   The patient had palonosetron (ALOXI) injection 0.25 mg, 0.25 mg, Intravenous,  Once, 2 of 5 cycles Administration: 0.25 mg (12/05/2019), 0.25 mg (12/26/2019) ado-trastuzumab emtansine (KADCYLA) 260 mg in sodium chloride 0.9 % 250 mL chemo infusion, 3.6 mg/kg = 260 mg, Intravenous, Once, 3 of 6 cycles Administration: 260 mg (11/14/2019), 260 mg (12/05/2019), 260 mg (12/26/2019)  for chemotherapy treatment.      CHIEF COMPLIANT: Follow-up ofmetastatic breast cancer  INTERVAL HISTORY: Erin [name] is a 59 y.o. with above-mentioned history of metastaticbreast cancerwith bone metastases,and fallopian tube cancer.She is currently on treatment with Talazoparib and Xgeva.She presents to the clinic todayfor treatment.  Her clinical symptoms have not improved.  She continues to have intense discomfort when she swallows and she throws up immediately.  In fact she has not eaten for the past 3 to 4 weeks.  She cannot even keep water down.  We had to give her IV fluids for the past 10 days.  Oral or rectal nausea medications do not seem to give her any benefit.  She has used gastric coating agents which have also not helped.  She is now very anxious to even put anything in her mouth.  ALLERGIES:  has No Known Allergies.  MEDICATIONS:  Current Outpatient Medications  Medication Sig Dispense Refill  . Accu-Chek FastClix Lancets MISC Use as instructed. Inject into the skin twice daily 100 each 3  . atorvastatin (LIPITOR) 40 MG tablet Take 1 tablet (40 mg total) by mouth daily. 90 tablet 1  . Blood Glucose Calibration (ACCU-CHEK GUIDE CONTROL) LIQD 1 each by In Vitro route once as  needed for up to 1 dose. 1 each 0  . cholecalciferol (VITAMIN D) 1000 units tablet Take 1,000 Units by mouth daily.    Marland Kitchen docusate sodium (COLACE) 100 MG capsule Take 1 capsule (100 mg total) by mouth 2 (two) times daily. 60 capsule 3  . fentaNYL (DURAGESIC) 25 MCG/HR Place 1 patch onto the skin every 3 (three) days. 10 patch 0  . ferrous sulfate 325 (65 FE) MG tablet TAKE 1 TABLET (325 MG TOTAL) BY MOUTH DAILY BEFORE BREAKFAST. 90 tablet 1  . glimepiride (AMARYL) 2 MG tablet Take 1 tablet (2 mg total) by mouth daily with breakfast. To lower blood sugar (Patient taking differently: Take 2 mg by mouth daily with breakfast. To lower blood sugar) 90 tablet 3  . glucose blood (ACCU-CHEK GUIDE) test strip Use as instructed. Check blood glucose by fingerstick twice per day. 100 each 12  . Lancet Devices (ACCU-CHEK SOFTCLIX) lancets Use as instructed 1 each 0  . lidocaine (XYLOCAINE) 2 % solution 1 teaspoon mixed with 30 ml of Mylanta q 3 hours prn GERD 200 mL 1  . lisinopril (ZESTRIL) 10 MG tablet Take 1 tablet (10 mg total) by mouth daily. To lower blood pressure (Patient taking differently: Take 10 mg by mouth daily. To lower blood pressure) 90 tablet 3  . metFORMIN (GLUCOPHAGE-XR) 500 MG 24 hr tablet TAKE 2  TABLETS BY MOUTH EVERY DAY WITH BREAKFAST 180 tablet 0  . methocarbamol (ROBAXIN) 500 MG tablet Take 2 tablets (1,000 mg total) by mouth at bedtime as needed for muscle spasms. 60 tablet 0  . ondansetron (ZOFRAN ODT) 4 MG disintegrating tablet Take 1 tablet (4 mg total) by mouth every 8 (eight) hours as needed for nausea or vomiting. 20 tablet 0  . pantoprazole (PROTONIX) 20 MG tablet Take 1 tablet (20 mg total) by mouth daily. 30 tablet 6  . promethazine (PHENERGAN) 12.5 MG suppository Place 1 suppository (12.5 mg total) rectally every 6 (six) hours as needed for nausea or vomiting. 12 each 0  . sucralfate (CARAFATE) 1 g tablet Take 1 tablet (1 g total) by mouth 4 (four) times daily. Dissolve each  tablet in 15 cc water before use. 120 tablet 2  . talazoparib tosylate (TALZENNA) 1 MG capsule Take 1 capsule (1 mg total) by mouth daily. 30 capsule 3   No current facility-administered medications for this visit.   Facility-Administered Medications Ordered in Other Visits  Medication Dose Route Frequency Provider Last Rate Last Admin  . heparin lock flush 100 unit/mL  500 Units Intracatheter Once PRN [name], MD      . sodium chloride flush (NS) 0.9 % injection 10 mL  10 mL Intracatheter PRN [name], MD        PHYSICAL EXAMINATION: ECOG PERFORMANCE STATUS: 2 - Symptomatic, <50% confined to bed  Vitals:   01/26/20 0834  BP: (!) 116/53  Pulse: 100  Resp: 18  Temp: (!) 97.3 F (36.3 C)  SpO2: 100%   Filed Weights   01/26/20 0834  Weight: 137 lb 12.8 oz (62.5 kg)    LABORATORY DATA:  I have reviewed the data as listed CMP Latest Ref Rng & Units 01/16/2020 12/26/2019 12/05/2019  Glucose 70 - 99 mg/dL 101(H) 73 85  BUN 6 - 20 mg/dL _0 Creatinine 0.44 - 1.00 mg/dL 0.75 0.74 0.73  Sodium 135 - 145 mmol/L 138 137 142  Potassium 3.5 - 5.1 mmol/L 3.7 3.9 3.7  Chloride 98 - 111 mmol/L 101 101 105  CO2 22 - 32 mmol/L _1 Calcium 8.9 - 10.3 mg/dL 9.6 10.0 9.3  Total Protein 6.5 - 8.1 g/dL 8.7(H) 8.6(H) 7.7  Total Bilirubin 0.3 - 1.2 mg/dL 0.4 0.3 0.3  Alkaline Phos 38 - 126 U/L 102 102 93  AST 15 - 41 U/L _2 ALT 0 - 44 U/L _3 Lab Results  Component Value Date   WBC 5.1 01/26/2020   HGB 9.9 (L) 01/26/2020   HCT 30.2 (L) 01/26/2020   MCV 90.7 01/26/2020   PLT 87 (L) 01/26/2020   NEUTROABS 3.9 01/26/2020    ASSESSMENT & PLAN:  Malignant neoplasm of lower-outer quadrant of left breast of female, estrogen receptor negative (HCC) Left lumpectomy: IDC grade 3, 1.2 cm, DCIS is present, margins negative, 0/6 lymph nodes negative, ER 0%, PR 0%, HER-2 positive ratio 2.28, Ki-67 20%, T1 BN 0 stage IA S/P Taxol-Herceptin BRCA 2 Mutation  Positive  10/20/19:Bone mets involving Right hip, Bil Iliac bones, T8, T9, L2 and L3, Rt prox humerus, Righ glenoid. Some lytic and some sclerotic. Mild Left supraclav adenopathy, 2 small lesions basal ganglia (could be lacunar infarcts)  Biopsy the supra clav LN: 10/31/2019: Metastatic breast cancer, ER/PR negative HER-2 equivocal by IHC, FISHpositive Treatment summary: Kadcyla every 3-week treatment.  Discontinued for progression ---------------------------------------------------------------------------------------------------------------------------------------------------------- Echocardiogram 11/14/2019  Current treatment:Talazoparib to be started 01/29/2020  Severe pain in the low back and right QVO:[id number] onPercocet. MRI of the back 01/07/2020: Extensive metastatic disease to the involved spine. Tumor replacement of L3 vertebral body with pathologic compression fracture, epidural and extraosseous extension. Tumor extending throughout L3 foramen.  CT chest abdomen pelvis4/20/2021:Interval progression of bone metastases involving the spine and bony pelvis as well as the proximal right femur. New multifocal low-density lesions in both lobes of the liver concerning for liver metastases. Similar soft tissue infiltration left supraclavicular region. L3 epidural extension of the tumor.  Rapid increase in Ca1 25:Coincided with the progression of disease noted on the imaging studies.  Recommendation: Talazoparib to be started 01/29/2020   Severe dehydration and nausea with poor oral intake: Patient received IV fluids over the past 10 days. Radiation-induced esophagitis: Extremely poor oral intake.  I discussed with her about options.  I recommended that she start TPN at home.  We will consult home health for that. I would also like her to see gastroenterology.  I called and discussed the case with Dr. Silverio Decamp.  Be able to take currently added to her IV today as well she is getting 2  hours of fluids via the next 24-week and we have not heard from home health also #2 the infusion think she has an appointment next   Home health is trying to figure out the TPN issue.  It appears that it may not be possible for her to get TPN because of her Medicaid insurance.  We are debating between hospitalization for TPN versus seeing Dr. Silverio Decamp and evaluation for PEG tube.  Return to clinic in 1 week for follow-up.    No orders of the defined types were placed in this encounter.  The patient has a good understanding of the overall plan. she agrees with it. she will call with any problems that may develop before the next visit here.  Total time spent: 45 mins including face to face time and time spent for planning, charting and coordination of care  [name], MD 01/26/2020  I, Cloyde Reams Dorshimer, am acting as scribe for [name].  I have reviewed the above documentation for accuracy and completeness, and I agree with the above.

## 2020-01-26 ENCOUNTER — Inpatient Hospital Stay: Payer: Medicaid Other

## 2020-01-26 ENCOUNTER — Ambulatory Visit: Payer: Medicaid Other

## 2020-01-26 ENCOUNTER — Inpatient Hospital Stay (HOSPITAL_BASED_OUTPATIENT_CLINIC_OR_DEPARTMENT_OTHER): Payer: Medicaid Other | Admitting: Hematology and Oncology

## 2020-01-26 ENCOUNTER — Other Ambulatory Visit: Payer: Self-pay

## 2020-01-26 VITALS — BP 116/53 | HR 100 | Temp 97.3°F | Resp 18 | Ht 61.0 in | Wt 137.8 lb

## 2020-01-26 DIAGNOSIS — Z95828 Presence of other vascular implants and grafts: Secondary | ICD-10-CM

## 2020-01-26 DIAGNOSIS — R110 Nausea: Secondary | ICD-10-CM

## 2020-01-26 DIAGNOSIS — C50512 Malignant neoplasm of lower-outer quadrant of left female breast: Secondary | ICD-10-CM

## 2020-01-26 DIAGNOSIS — C7951 Secondary malignant neoplasm of bone: Secondary | ICD-10-CM | POA: Diagnosis not present

## 2020-01-26 DIAGNOSIS — Z171 Estrogen receptor negative status [ER-]: Secondary | ICD-10-CM

## 2020-01-26 DIAGNOSIS — C5701 Malignant neoplasm of right fallopian tube: Secondary | ICD-10-CM | POA: Diagnosis not present

## 2020-01-26 DIAGNOSIS — C50919 Malignant neoplasm of unspecified site of unspecified female breast: Secondary | ICD-10-CM

## 2020-01-26 LAB — TRIGLYCERIDES: Triglycerides: 112 mg/dL (ref <150)

## 2020-01-26 LAB — CMP (CANCER CENTER ONLY)
ALT: 12 U/L (ref 0–44)
AST: 23 U/L (ref 15–41)
Albumin: 2.8 g/dL — ABNORMAL LOW (ref 3.5–5.0)
Alkaline Phosphatase: 89 U/L (ref 38–126)
Anion gap: 13 (ref 5–15)
BUN: 5 mg/dL — ABNORMAL LOW (ref 6–20)
CO2: 22 mmol/L (ref 22–32)
Calcium: 7.5 mg/dL — ABNORMAL LOW (ref 8.9–10.3)
Chloride: 103 mmol/L (ref 98–111)
Creatinine: 0.57 mg/dL (ref 0.44–1.00)
GFR, Est AFR Am: >60 mL/min (ref >60)
GFR, Estimated: >60 mL/min (ref >60)
Glucose, Bld: 97 mg/dL (ref 70–99)
Potassium: 3 mmol/L — CL (ref 3.5–5.1)
Sodium: 138 mmol/L (ref 135–145)
Total Bilirubin: 0.3 mg/dL (ref 0.3–1.2)
Total Protein: 7.4 g/dL (ref 6.5–8.1)

## 2020-01-26 LAB — CBC WITH DIFFERENTIAL (CANCER CENTER ONLY)
Abs Immature Granulocytes: 0.05 10*3/uL (ref 0.00–0.07)
Basophils Absolute: 0 10*3/uL (ref 0.0–0.1)
Basophils Relative: 0 %
Eosinophils Absolute: 0.1 10*3/uL (ref 0.0–0.5)
Eosinophils Relative: 2 %
HCT: 30.2 % — ABNORMAL LOW (ref 36.0–46.0)
Hemoglobin: 9.9 g/dL — ABNORMAL LOW (ref 12.0–15.0)
Immature Granulocytes: 1 %
Lymphocytes Relative: 9 %
Lymphs Abs: 0.5 10*3/uL — ABNORMAL LOW (ref 0.7–4.0)
MCH: 29.7 pg (ref 26.0–34.0)
MCHC: 32.8 g/dL (ref 30.0–36.0)
MCV: 90.7 fL (ref 80.0–100.0)
Monocytes Absolute: 0.5 10*3/uL (ref 0.1–1.0)
Monocytes Relative: 11 %
Neutro Abs: 3.9 10*3/uL (ref 1.7–7.7)
Neutrophils Relative %: 77 %
Platelet Count: 87 10*3/uL — ABNORMAL LOW (ref 150–400)
RBC: 3.33 MIL/uL — ABNORMAL LOW (ref 3.87–5.11)
RDW: 17.1 % — ABNORMAL HIGH (ref 11.5–15.5)
WBC Count: 5.1 10*3/uL (ref 4.0–10.5)
nRBC: 0 % (ref 0.0–0.2)

## 2020-01-26 LAB — MAGNESIUM: Magnesium: 1.8 mg/dL (ref 1.7–2.4)

## 2020-01-26 LAB — PREALBUMIN: Prealbumin: 7.2 mg/dL — ABNORMAL LOW (ref 18–38)

## 2020-01-26 LAB — PHOSPHORUS: Phosphorus: 1.1 mg/dL — ABNORMAL LOW (ref 2.5–4.6)

## 2020-01-26 MED ORDER — SODIUM CHLORIDE 0.9% FLUSH
10.0000 mL | INTRAVENOUS | Status: DC | PRN
  Administered 2020-01-26: 10 mL
  Filled 2020-01-26: qty 10

## 2020-01-26 MED ORDER — POTASSIUM CHLORIDE IN NACL 20-0.9 MEQ/L-% IV SOLN
Freq: Once | INTRAVENOUS | Status: AC
  Filled 2020-01-26 (×2): qty 1000

## 2020-01-26 MED ORDER — HEPARIN SOD (PORK) LOCK FLUSH 100 UNIT/ML IV SOLN
500.0000 [IU] | Freq: Once | INTRAVENOUS | Status: AC | PRN
  Administered 2020-01-26: 500 [IU]
  Filled 2020-01-26: qty 5

## 2020-01-26 MED ORDER — SODIUM CHLORIDE 0.9 % IV SOLN
INTRAVENOUS | Status: AC
  Filled 2020-01-26: qty 250

## 2020-01-26 NOTE — Progress Notes (Signed)
Per Kelsey/Dr. [name], give potassium chloride 20 mEq IV in 1L normal saline over 2 hours today. This is for potassium level of 3.0.  Demetrius Charity, PharmD, BCPS, [facility] Pharmacist Pharmacy Phone: [phone] 01/26/2020

## 2020-01-26 NOTE — Progress Notes (Signed)
[name] was seen in the infusion room with her interpreter.  She is receiving IV fluids and antiemetics today.  She continues to have pain in her mid chest.  All of the medications which she has tried for control of this pain caused her to vomit.  It was suggested to her today that she might try Rolaids.  She is agreeable to try fruit flavored Rolaids.  She was also told that she could either use the gummy Rolaids or that she could take several Rolaids and dissolve them in a small amount of water to drink in hopes that this would coat her esophagus and relieve her foot pain.    Sandi Mealy, MHS, PA-C  Physician Assistant

## 2020-01-26 NOTE — Assessment & Plan Note (Signed)
Left lumpectomy: IDC grade 3, 1.2 cm, DCIS is present, margins negative, 0/6 lymph nodes negative, ER 0%, PR 0%, HER-2 positive ratio 2.28, Ki-67 20%, T1 BN 0 stage IA S/P Taxol-Herceptin BRCA 2 Mutation Positive  10/20/19:Bone mets involving Right hip, Bil Iliac bones, T8, T9, L2 and L3, Rt prox humerus, Righ glenoid. Some lytic and some sclerotic. Mild Left supraclav adenopathy, 2 small lesions basal ganglia (could be lacunar infarcts)  Biopsy the supra clav LN: 10/31/2019: Metastatic breast cancer, ER/PR negative HER-2 equivocal by IHC, FISHpositive Treatment summary: Kadcyla every 3-week treatment.  Discontinued for progression ---------------------------------------------------------------------------------------------------------------------------------------------------------- Echocardiogram 11/14/2019 Current treatment:Talazoparib to be started 01/29/2020  Severe pain in the low back and right LYY:[id number] onPercocet. MRI of the back 01/07/2020: Extensive metastatic disease to the involved spine. Tumor replacement of L3 vertebral body with pathologic compression fracture, epidural and extraosseous extension. Tumor extending throughout L3 foramen.  CT chest abdomen pelvis4/20/2021:Interval progression of bone metastases involving the spine and bony pelvis as well as the proximal right femur. New multifocal low-density lesions in both lobes of the liver concerning for liver metastases. Similar soft tissue infiltration left supraclavicular region. L3 epidural extension of the tumor.  Rapid increase in Ca1 25:Coincided with the progression of disease noted on the imaging studies.  Recommendation: Talazoparib to be started 01/29/2020   Severe dehydration and nausea with poor oral intake: Patient received IV fluids over the past 10 days. Return to clinic in 1 week for follow-up.

## 2020-01-29 ENCOUNTER — Telehealth: Payer: Self-pay | Admitting: Hematology and Oncology

## 2020-01-29 ENCOUNTER — Other Ambulatory Visit: Payer: Self-pay

## 2020-01-29 ENCOUNTER — Telehealth: Payer: Self-pay | Admitting: *Deleted

## 2020-01-29 ENCOUNTER — Inpatient Hospital Stay (HOSPITAL_COMMUNITY)
Admission: EM | Admit: 2020-01-29 | Discharge: 2020-02-03 | DRG: 368 | Disposition: A | Discharge status: Home Health | Payer: Medicare Other | Attending: Family Medicine | Admitting: Family Medicine

## 2020-01-29 ENCOUNTER — Encounter: Payer: Self-pay | Admitting: Gastroenterology

## 2020-01-29 ENCOUNTER — Encounter (HOSPITAL_COMMUNITY): Payer: Self-pay | Admitting: Emergency Medicine

## 2020-01-29 ENCOUNTER — Inpatient Hospital Stay (HOSPITAL_COMMUNITY): Payer: Medicare Other

## 2020-01-29 ENCOUNTER — Ambulatory Visit: Payer: Medicaid Other | Admitting: Gastroenterology

## 2020-01-29 DIAGNOSIS — K208 Other esophagitis without bleeding: Secondary | ICD-10-CM | POA: Diagnosis present

## 2020-01-29 DIAGNOSIS — B3781 Candidal esophagitis: Principal | ICD-10-CM

## 2020-01-29 DIAGNOSIS — E43 Unspecified severe protein-calorie malnutrition: Secondary | ICD-10-CM | POA: Diagnosis present

## 2020-01-29 DIAGNOSIS — Z1501 Genetic susceptibility to malignant neoplasm of breast: Secondary | ICD-10-CM

## 2020-01-29 DIAGNOSIS — E46 Unspecified protein-calorie malnutrition: Secondary | ICD-10-CM | POA: Diagnosis present

## 2020-01-29 DIAGNOSIS — T66XXXA Radiation sickness, unspecified, initial encounter: Secondary | ICD-10-CM

## 2020-01-29 DIAGNOSIS — Y842 Radiological procedure and radiotherapy as the cause of abnormal reaction of the patient, or of later complication, without mention of misadventure at the time of the procedure: Secondary | ICD-10-CM | POA: Diagnosis present

## 2020-01-29 DIAGNOSIS — Z803 Family history of malignant neoplasm of breast: Secondary | ICD-10-CM

## 2020-01-29 DIAGNOSIS — R0789 Other chest pain: Secondary | ICD-10-CM

## 2020-01-29 DIAGNOSIS — H9193 Unspecified hearing loss, bilateral: Secondary | ICD-10-CM

## 2020-01-29 DIAGNOSIS — H9190 Unspecified hearing loss, unspecified ear: Secondary | ICD-10-CM

## 2020-01-29 DIAGNOSIS — Z90710 Acquired absence of both cervix and uterus: Secondary | ICD-10-CM | POA: Diagnosis not present

## 2020-01-29 DIAGNOSIS — C7951 Secondary malignant neoplasm of bone: Secondary | ICD-10-CM | POA: Diagnosis present

## 2020-01-29 DIAGNOSIS — R1319 Other dysphagia: Secondary | ICD-10-CM | POA: Diagnosis not present

## 2020-01-29 DIAGNOSIS — G8929 Other chronic pain: Secondary | ICD-10-CM | POA: Diagnosis present

## 2020-01-29 DIAGNOSIS — C770 Secondary and unspecified malignant neoplasm of lymph nodes of head, face and neck: Secondary | ICD-10-CM | POA: Diagnosis present

## 2020-01-29 DIAGNOSIS — R1310 Dysphagia, unspecified: Secondary | ICD-10-CM

## 2020-01-29 DIAGNOSIS — Z90722 Acquired absence of ovaries, bilateral: Secondary | ICD-10-CM

## 2020-01-29 DIAGNOSIS — K449 Diaphragmatic hernia without obstruction or gangrene: Secondary | ICD-10-CM | POA: Diagnosis not present

## 2020-01-29 DIAGNOSIS — Z800 Family history of malignant neoplasm of digestive organs: Secondary | ICD-10-CM | POA: Diagnosis not present

## 2020-01-29 DIAGNOSIS — Z79899 Other long term (current) drug therapy: Secondary | ICD-10-CM

## 2020-01-29 DIAGNOSIS — E119 Type 2 diabetes mellitus without complications: Secondary | ICD-10-CM | POA: Diagnosis present

## 2020-01-29 DIAGNOSIS — E860 Dehydration: Secondary | ICD-10-CM | POA: Diagnosis present

## 2020-01-29 DIAGNOSIS — Z20822 Contact with and (suspected) exposure to covid-19: Secondary | ICD-10-CM | POA: Diagnosis present

## 2020-01-29 DIAGNOSIS — R1110 Vomiting, unspecified: Secondary | ICD-10-CM

## 2020-01-29 DIAGNOSIS — K648 Other hemorrhoids: Secondary | ICD-10-CM | POA: Diagnosis present

## 2020-01-29 DIAGNOSIS — Z8544 Personal history of malignant neoplasm of other female genital organs: Secondary | ICD-10-CM

## 2020-01-29 DIAGNOSIS — Z923 Personal history of irradiation: Secondary | ICD-10-CM

## 2020-01-29 DIAGNOSIS — I10 Essential (primary) hypertension: Secondary | ICD-10-CM | POA: Diagnosis not present

## 2020-01-29 DIAGNOSIS — C50512 Malignant neoplasm of lower-outer quadrant of left female breast: Secondary | ICD-10-CM | POA: Diagnosis present

## 2020-01-29 DIAGNOSIS — E876 Hypokalemia: Secondary | ICD-10-CM | POA: Diagnosis present

## 2020-01-29 DIAGNOSIS — Z6827 Body mass index (BMI) 27.0-27.9, adult: Secondary | ICD-10-CM

## 2020-01-29 DIAGNOSIS — C5701 Malignant neoplasm of right fallopian tube: Secondary | ICD-10-CM | POA: Diagnosis present

## 2020-01-29 DIAGNOSIS — E785 Hyperlipidemia, unspecified: Secondary | ICD-10-CM | POA: Diagnosis present

## 2020-01-29 DIAGNOSIS — R110 Nausea: Secondary | ICD-10-CM | POA: Diagnosis not present

## 2020-01-29 DIAGNOSIS — Z171 Estrogen receptor negative status [ER-]: Secondary | ICD-10-CM

## 2020-01-29 DIAGNOSIS — D696 Thrombocytopenia, unspecified: Secondary | ICD-10-CM | POA: Diagnosis present

## 2020-01-29 DIAGNOSIS — Z9221 Personal history of antineoplastic chemotherapy: Secondary | ICD-10-CM | POA: Diagnosis not present

## 2020-01-29 LAB — CBC
HCT: 33.8 % — ABNORMAL LOW (ref 36.0–46.0)
HCT: 31.4 % — ABNORMAL LOW (ref 36.0–46.0)
Hemoglobin: 10.9 g/dL — ABNORMAL LOW (ref 12.0–15.0)
Hemoglobin: 10.3 g/dL — ABNORMAL LOW (ref 12.0–15.0)
MCH: 29.5 pg (ref 26.0–34.0)
MCH: 29.7 pg (ref 26.0–34.0)
MCHC: 32.2 g/dL (ref 30.0–36.0)
MCHC: 32.8 g/dL (ref 30.0–36.0)
MCV: 91.6 fL (ref 80.0–100.0)
MCV: 90.5 fL (ref 80.0–100.0)
Platelets: 120 10*3/uL — ABNORMAL LOW (ref 150–400)
Platelets: 100 10*3/uL — ABNORMAL LOW (ref 150–400)
RBC: 3.69 MIL/uL — ABNORMAL LOW (ref 3.87–5.11)
RBC: 3.47 MIL/uL — ABNORMAL LOW (ref 3.87–5.11)
RDW: 17.5 % — ABNORMAL HIGH (ref 11.5–15.5)
RDW: 17.4 % — ABNORMAL HIGH (ref 11.5–15.5)
WBC: 5.6 10*3/uL (ref 4.0–10.5)
WBC: 4.8 10*3/uL (ref 4.0–10.5)
nRBC: 0 % (ref 0.0–0.2)
nRBC: 0 % (ref 0.0–0.2)

## 2020-01-29 LAB — BASIC METABOLIC PANEL
Anion gap: 12 (ref 5–15)
BUN: 5 mg/dL — ABNORMAL LOW (ref 6–20)
CO2: 19 mmol/L — ABNORMAL LOW (ref 22–32)
Calcium: 6.8 mg/dL — ABNORMAL LOW (ref 8.9–10.3)
Chloride: 104 mmol/L (ref 98–111)
Creatinine, Ser: 0.4 mg/dL — ABNORMAL LOW (ref 0.44–1.00)
GFR calc Af Amer: >60 mL/min (ref >60)
GFR calc non Af Amer: >60 mL/min (ref >60)
Glucose, Bld: 83 mg/dL (ref 70–99)
Potassium: 3.3 mmol/L — ABNORMAL LOW (ref 3.5–5.1)
Sodium: 135 mmol/L (ref 135–145)

## 2020-01-29 LAB — PREALBUMIN: Prealbumin: 10.4 mg/dL — ABNORMAL LOW (ref 18–38)

## 2020-01-29 LAB — SARS CORONAVIRUS 2 BY RT PCR (HOSPITAL ORDER, PERFORMED IN ~~LOC~~ HOSPITAL LAB): SARS Coronavirus 2: NEGATIVE

## 2020-01-29 LAB — URINALYSIS, ROUTINE W REFLEX MICROSCOPIC
Bilirubin Urine: NEGATIVE
Glucose, UA: NEGATIVE mg/dL
Ketones, ur: 80 mg/dL — AB
Nitrite: NEGATIVE
Protein, ur: NEGATIVE mg/dL
Specific Gravity, Urine: 1.011 (ref 1.005–1.030)
pH: 6 (ref 5.0–8.0)

## 2020-01-29 LAB — COMPREHENSIVE METABOLIC PANEL
ALT: 16 U/L (ref 0–44)
AST: 23 U/L (ref 15–41)
Albumin: 3.3 g/dL — ABNORMAL LOW (ref 3.5–5.0)
Alkaline Phosphatase: 85 U/L (ref 38–126)
Anion gap: 16 — ABNORMAL HIGH (ref 5–15)
BUN: 8 mg/dL (ref 6–20)
CO2: 22 mmol/L (ref 22–32)
Calcium: 7.7 mg/dL — ABNORMAL LOW (ref 8.9–10.3)
Chloride: 99 mmol/L (ref 98–111)
Creatinine, Ser: 0.45 mg/dL (ref 0.44–1.00)
GFR calc Af Amer: >60 mL/min (ref >60)
GFR calc non Af Amer: >60 mL/min (ref >60)
Glucose, Bld: 95 mg/dL (ref 70–99)
Potassium: 3.1 mmol/L — ABNORMAL LOW (ref 3.5–5.1)
Sodium: 137 mmol/L (ref 135–145)
Total Bilirubin: 0.9 mg/dL (ref 0.3–1.2)
Total Protein: 8.1 g/dL (ref 6.5–8.1)

## 2020-01-29 LAB — MAGNESIUM
Magnesium: 2.2 mg/dL (ref 1.7–2.4)
Magnesium: 1.8 mg/dL (ref 1.7–2.4)

## 2020-01-29 LAB — LIPASE, BLOOD: Lipase: 18 U/L (ref 11–51)

## 2020-01-29 LAB — ALBUMIN: Albumin: 2.7 g/dL — ABNORMAL LOW (ref 3.5–5.0)

## 2020-01-29 MED ORDER — ONDANSETRON HCL 4 MG PO TABS: 4.0000 mg | ORAL_TABLET | Freq: Four times a day (QID) | ORAL | Status: DC | PRN

## 2020-01-29 MED ORDER — SODIUM CHLORIDE 0.9 % IV BOLUS
1000.0000 mL | Freq: Once | INTRAVENOUS | Status: AC
  Administered 2020-01-29: 1000 mL via INTRAVENOUS

## 2020-01-29 MED ORDER — LANSOPRAZOLE 30 MG PO TBDD: 30.0000 mg | DELAYED_RELEASE_TABLET | Freq: Two times a day (BID) | ORAL | 3 refills | Status: DC

## 2020-01-29 MED ORDER — POTASSIUM CHLORIDE 10 MEQ/100ML IV SOLN
10.0000 meq | INTRAVENOUS | Status: AC
  Administered 2020-01-29 (×2): 10 meq via INTRAVENOUS
  Filled 2020-01-29 (×2): qty 100

## 2020-01-29 MED ORDER — POTASSIUM CHLORIDE 10 MEQ/100ML IV SOLN: 10.0000 meq | INTRAVENOUS | Status: DC

## 2020-01-29 MED ORDER — SODIUM CHLORIDE 0.9 % IV SOLN
12.5000 mg | Freq: Four times a day (QID) | INTRAVENOUS | Status: DC | PRN
  Administered 2020-01-31: 12.5 mg via INTRAVENOUS
  Filled 2020-01-29 (×4): qty 0.5

## 2020-01-29 MED ORDER — HYDROMORPHONE HCL 1 MG/ML IJ SOLN
0.5000 mg | INTRAMUSCULAR | Status: DC | PRN
  Administered 2020-01-30: 1 mg via INTRAVENOUS
  Filled 2020-01-29: qty 1

## 2020-01-29 MED ORDER — ENOXAPARIN SODIUM 40 MG/0.4ML ~~LOC~~ SOLN
40.0000 mg | SUBCUTANEOUS | Status: DC
  Administered 2020-01-29 – 2020-02-02 (×5): 40 mg via SUBCUTANEOUS
  Filled 2020-01-29 (×5): qty 0.4

## 2020-01-29 MED ORDER — ONDANSETRON 4 MG PO TBDP: 4.0000 mg | ORAL_TABLET | Freq: Three times a day (TID) | ORAL | 0 refills | Status: DC | PRN

## 2020-01-29 MED ORDER — POTASSIUM CHLORIDE 10 MEQ/100ML IV SOLN
10.0000 meq | INTRAVENOUS | Status: AC
  Administered 2020-01-29 (×4): 10 meq via INTRAVENOUS
  Filled 2020-01-29 (×3): qty 100

## 2020-01-29 MED ORDER — ONDANSETRON HCL 4 MG/2ML IJ SOLN
INTRAMUSCULAR | Status: AC
  Filled 2020-01-29: qty 2

## 2020-01-29 MED ORDER — ONDANSETRON HCL 4 MG/2ML IJ SOLN
4.0000 mg | Freq: Four times a day (QID) | INTRAMUSCULAR | Status: DC | PRN
  Administered 2020-01-29 – 2020-02-01 (×3): 4 mg via INTRAVENOUS
  Filled 2020-01-29 (×3): qty 2

## 2020-01-29 MED ORDER — LIDOCAINE VISCOUS HCL 2 % MT SOLN
15.0000 mL | OROMUCOSAL | Status: DC | PRN
  Administered 2020-01-30: 15 mL via ORAL
  Filled 2020-01-29: qty 15

## 2020-01-29 MED ORDER — METHOCARBAMOL 1000 MG/10ML IJ SOLN
500.0000 mg | Freq: Four times a day (QID) | INTRAVENOUS | Status: DC | PRN
  Filled 2020-01-29: qty 5

## 2020-01-29 MED ORDER — DICYCLOMINE HCL 10 MG/5ML PO SOLN
10.0000 mg | Freq: Four times a day (QID) | ORAL | Status: DC | PRN
  Filled 2020-01-29: qty 5

## 2020-01-29 MED ORDER — ACETAMINOPHEN 650 MG RE SUPP: 650.0000 mg | Freq: Four times a day (QID) | RECTAL | Status: DC | PRN

## 2020-01-29 MED ORDER — ACETAMINOPHEN 325 MG PO TABS
650.0000 mg | ORAL_TABLET | Freq: Four times a day (QID) | ORAL | Status: DC | PRN
  Administered 2020-02-02: 650 mg via ORAL
  Filled 2020-01-29: qty 2

## 2020-01-29 MED ORDER — FLUCONAZOLE 10 MG/ML PO SUSR: 100.0000 mg | Freq: Every day | ORAL | 0 refills | Status: DC

## 2020-01-29 MED ORDER — SODIUM CHLORIDE 0.9% FLUSH
3.0000 mL | Freq: Two times a day (BID) | INTRAVENOUS | Status: DC
  Administered 2020-01-29: 3 mL via INTRAVENOUS

## 2020-01-29 MED ORDER — SUCRALFATE 1 GM/10ML PO SUSP: ORAL | 1 refills | Status: DC

## 2020-01-29 MED ORDER — ALUM & MAG HYDROXIDE-SIMETH 200-200-20 MG/5ML PO SUSP: 30.0000 mL | ORAL | Status: DC | PRN

## 2020-01-29 MED ORDER — SODIUM CHLORIDE 0.9% FLUSH
3.0000 mL | Freq: Once | INTRAVENOUS | Status: AC
  Administered 2020-01-29: 3 mL via INTRAVENOUS

## 2020-01-29 MED ORDER — DEXTROSE-NACL 5-0.9 % IV SOLN: INTRAVENOUS | Status: AC

## 2020-01-29 NOTE — Progress Notes (Signed)
Erin [name]    [id number]    08-23-1961  Primary Care Physician:Fulp, Cammie, MD  Referring Physician: Antony Blackbird, MD [place],  [place] [address]   Chief complaint: Odynophagia, dysphagia, chest pain HPI: 59 year old female with metastatic fallopian tube carcinoma, bony mets that is post TAH/BSO, chemotherapy and radiation is here for follow-up visit accompanied by her mother and sign language interpreter for  dysphagia and odynophagia Most recent palliative radiation to spinal cord April 20 to Jan 19, 2020  She is having significant difficulty swallowing post radiation, receiving IV fluids daily at cancer center. She is only drinking alkaline water but otherwise has not been able to eat anything.  Per her mother she does not like to drink any sugary juices, boost breeze, Gatorade, Pedialyte, Jell-O or even popsicles.  States they are too sweet and makes her nauseous.  She has not tried any broth or clear soups.  Her chest hurts when she tries to eat anything and sometimes even with saliva, she feels the chest hurts all the time.  No significant improvement with Carafate tablets, she dissolves the tablets prior to drinking it as a slurry.  Oncologic history positive for breast cancer s/p lumpectomy, adjuvant chemotherapy and radiation therapy  Colonoscopy November 02, 2017: Removal of 2 small sessile polyps [tubular adenoma X1 and hyperplastic polypX1], internal hemorrhoids otherwise normal exam  Outpatient Encounter Medications as of 01/29/2020  Medication Sig  . Accu-Chek FastClix Lancets MISC Use as instructed. Inject into the skin twice daily  . atorvastatin (LIPITOR) 40 MG tablet Take 1 tablet (40 mg total) by mouth daily.  . Blood Glucose Calibration (ACCU-CHEK GUIDE CONTROL) LIQD 1 each by In Vitro route once as needed for up to 1 dose.  . cholecalciferol (VITAMIN D) 1000 units tablet Take 1,000 Units by mouth daily.  Marland Kitchen docusate sodium  (COLACE) 100 MG capsule Take 1 capsule (100 mg total) by mouth 2 (two) times daily.  . fentaNYL (DURAGESIC) 25 MCG/HR Place 1 patch onto the skin every 3 (three) days.  . ferrous sulfate 325 (65 FE) MG tablet TAKE 1 TABLET (325 MG TOTAL) BY MOUTH DAILY BEFORE BREAKFAST.  Marland Kitchen glimepiride (AMARYL) 2 MG tablet Take 1 tablet (2 mg total) by mouth daily with breakfast. To lower blood sugar (Patient taking differently: Take 2 mg by mouth daily with breakfast. To lower blood sugar)  . glucose blood (ACCU-CHEK GUIDE) test strip Use as instructed. Check blood glucose by fingerstick twice per day.  Elmore Guise Devices (ACCU-CHEK SOFTCLIX) lancets Use as instructed  . lidocaine (XYLOCAINE) 2 % solution 1 teaspoon mixed with 30 ml of Mylanta q 3 hours prn GERD  . lisinopril (ZESTRIL) 10 MG tablet Take 1 tablet (10 mg total) by mouth daily. To lower blood pressure (Patient taking differently: Take 10 mg by mouth daily. To lower blood pressure)  . metFORMIN (GLUCOPHAGE-XR) 500 MG 24 hr tablet TAKE 2 TABLETS BY MOUTH EVERY DAY WITH BREAKFAST  . methocarbamol (ROBAXIN) 500 MG tablet Take 2 tablets (1,000 mg total) by mouth at bedtime as needed for muscle spasms.  . ondansetron (ZOFRAN ODT) 4 MG disintegrating tablet Take 1 tablet (4 mg total) by mouth every 8 (eight) hours as needed for nausea or vomiting.  . pantoprazole (PROTONIX) 20 MG tablet Take 1 tablet (20 mg total) by mouth daily.  . promethazine (PHENERGAN) 12.5 MG suppository Place 1 suppository (12.5 mg total) rectally every 6 (six) hours  as needed for nausea or vomiting.  . sucralfate (CARAFATE) 1 g tablet Take 1 tablet (1 g total) by mouth 4 (four) times daily. Dissolve each tablet in 15 cc water before use.  . talazoparib tosylate (TALZENNA) 1 MG capsule Take 1 capsule (1 mg total) by mouth daily.   Facility-Administered Encounter Medications as of 01/29/2020  Medication  . heparin lock flush 100 unit/mL  . sodium chloride flush (NS) 0.9 % injection 10 mL     Allergies as of 01/29/2020  . (No Known Allergies)    Past Medical History:  Diagnosis Date  . Arthritis    knees, elbows  . Borderline glaucoma of right eye   . BRCA2 gene mutation positive 02/17/2018   BRCA2 c.4552del (p.[id number]*25)  Result reported out on 02/15/2018.   . Breast cancer ([place]) 05/2017   left  . Chronic back pain   . Deaf    per pt born hearing and at age 67 1/2 lost hearing , was told by mother unknown cause, can miminally hear in left and no hearing on right   . Depression   . Dyspnea    states some SOB with ADLs and anemia   . Elevated cancer antigen 125 (CA 125)   . Family history of breast cancer   . Family history of breast cancer   . Family history of colon cancer   . Genetic testing 02/17/2018   The Common Hereditary Cancer Panel offered by [facility] includes sequencing and/or deletion duplication testing of the following 47 genes: APC, ATM, AXIN2, BARD1, BMPR1A, BRCA1, BRCA2, BRIP1, CDH1, CDKN2A (p14ARF), CDKN2A (p16INK4a), CKD4, CHEK2, CTNNA1, DICER1, EPCAM (Deletion/duplication testing only), GREM1 (promoter region deletion/duplication testing only), KIT, MEN1, MLH1, MSH2, MSH3, MSH6, MU  . History of cancer chemotherapy    left breast cancer 09-08-2017 to 10-09-2017;  fallopion tube cancer  12-15-2017  to 06-28-2018  . 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 History of external beam radiation therapy    left breast  11-25-2017  to 01-07-2018  . Hyperlipidemia   . Hypertension    followed by pcp   (10-05-2019  per pt never had stress test)  . IDA (iron deficiency anemia)   . 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 Non-insulin dependent type 2 diabetes mellitus ([place])    followed by pcp   (10-05-2019 per pt check cbg every other day in AM,  fasting cbg-- 105)  . Numbness of right thumb   . Wears glasses     Past Surgical History:  Procedure Laterality Date  . BREAST LUMPECTOMY WITH RADIOACTIVE SEED AND SENTINEL LYMPH NODE BIOPSY Left 06/09/2017   Procedure: BREAST LUMPECTOMY WITH RADIOACTIVE SEED AND SENTINEL LYMPH NODE BIOPSY;  Surgeon: Excell Seltzer, MD;  Location: [facility];  Service: General;  Laterality: Left;  . COLONOSCOPY  11/02/2017   polyps  . HERNIA REPAIR    . HIATAL HERNIA REPAIR  09/2019  . LAPAROSCOPIC ASSISTED VAGINAL HYSTERECTOMY N/A 11/04/2017   Procedure: LAPAROSCOPIC ASSISTED VAGINAL HYSTERECTOMY;  Surgeon: [name], MD;  Location: [facility];  Service: Gynecology;  Laterality: N/A;  . LAPAROSCOPIC BILATERAL SALPINGO OOPHERECTOMY Bilateral 11/04/2017   Procedure: LAPAROSCOPIC BILATERAL SALPINGO OOPHORECTOMY;  Surgeon: [name], MD;  Location: [facility];  Service: Gynecology;  Laterality: Bilateral;  . LAPAROSCOPY N/A 10/09/2019   Procedure: LAPAROSCOPY DIAGNOSTIC WITH PERITONEAL BIOPSIES;  Surgeon: [name], MD;  Location: [facility];  Service: Gynecology;  Laterality: N/A;  . PORTACATH PLACEMENT Right 06/09/2017   Procedure: INSERTION PORT-A-CATH WITH [place];  Surgeon: Excell Seltzer, MD;  Location: [facility];  Service: General;  Laterality: Right;  . PORTACATH PLACEMENT Right 11/13/2019   Procedure: INSERTION PORT-A-CATH WITH ULTRASOUND GUIDANCE;  Surgeon: Rolm Bookbinder, MD;  Location: [facility];  Service: General;  Laterality: Right;  . TUBAL LIGATION  02/02/2002   _0    PPTL  . UPPER GI ENDOSCOPY      Family History  Problem Relation Age of Onset  . Hypertension  Mother   . Diabetes Father   . Hypertension Sister   . Diabetes Paternal Grandmother   . Colon cancer Paternal Grandmother 40  . CAD Brother   . Diabetes Brother   . Breast cancer Maternal Aunt        dx >50  . Heart attack Paternal Grandfather   . Breast cancer Maternal Aunt        dx under 51  . Breast cancer Maternal Aunt        dx  under 33    Social History   Socioeconomic History  . Marital status: Legally Separated    Spouse name: Not on file  . Number of children: Not on file  . Years of education: Not on file  . Highest education level: Not on file  Occupational History  . Not on file  Tobacco Use  . Smoking status: Never Smoker  . Smokeless tobacco: Never Used  Substance and Sexual Activity  . Alcohol use: No  . Drug use: Never  . Sexual activity: Not on file  Other Topics Concern  . Not on file  Social History Narrative   1 boy and 1 girl   Resides in [place] Determinants of Health   Financial Resource Strain:   . Difficulty of Paying Living Expenses:   Food Insecurity:   . Worried About Charity fundraiser in the Last Year:   . Arboriculturist in the Last Year:   Transportation Needs:   . Film/video editor (Medical):   Marland Kitchen Lack of Transportation (Non-Medical):   Physical Activity:   . Days of Exercise per Week:   . Minutes of Exercise per Session:   Stress:   . Feeling of Stress :   Social Connections:   . Frequency of Communication with Friends and Family:   . Frequency of Social Gatherings with Friends and Family:   . Attends Religious Services:   . Active Member of Clubs or Organizations:   . Attends Archivist Meetings:   Marland Kitchen Marital Status:   Intimate Partner Violence:   . Fear of Current or Ex-Partner:   . Emotionally Abused:   Marland Kitchen Physically Abused:   . Sexually Abused:       Review of systems:  All other review of systems negative except as mentioned in the HPI.   Physical Exam: Vitals:     01/29/20 0959  BP: (!) 108/58  Pulse: 100  Temp: 98.3  F (36.8 C)   There is no height or weight on file to calculate BMI. 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  She is hesitant about PEG tube or TPN for nutritional support  This visit required 45 minutes of patient care (this includes precharting, chart review, review of results, face-to-face time used for counseling as well as treatment plan and follow-up. The patient was provided an opportunity to ask questions and all were answered. The patient agreed with the plan and demonstrated an understanding of the instructions.  Damaris Hippo , MD    CC: Antony Blackbird, MD

## 2020-01-29 NOTE — Addendum Note (Signed)
Addended by: Stevan Born on: 01/29/2020 04:06 PM   Modules accepted: Orders

## 2020-01-29 NOTE — Patient Instructions (Addendum)
If you are age 57 or older, your body mass index should be between 23-30. Your Body mass index is 27.34 kg/m. If this is out of the aforementioned range listed, please consider follow up with your Primary Care Provider.  If you are age 43 or younger, your body mass index should be between 19-25. Your Body mass index is 27.34 kg/m. If this is out of the aformentioned range listed, please consider follow up with your Primary Care Provider.   You have been scheduled for a Barium Esophogram at [facility] (1st floor of the hospital) on 02-05-20 at 10:30am. Please arrive 15 minutes prior to your appointment for registration. Make certain not to have anything to eat or drink 3 hours prior to your test. If you need to reschedule for any reason, please contact radiology at [phone] to do so. _____________________________________________________ A barium swallow is an examination that concentrates on views of the esophagus. This tends to be a double contrast exam (barium and two liquids which, when combined, create a gas to distend the wall of the oesophagus) or single contrast (non-ionic iodine based). The study is usually tailored to your symptoms so a good history is essential. Attention is paid during the study to the form, structure and configuration of the esophagus, looking for functional disorders (such as aspiration, dysphagia, achalasia, motility and reflux) EXAMINATION You may be asked to change into a gown, depending on the type of swallow being performed. A radiologist and radiographer will perform the procedure. The radiologist will advise you of the type of contrast selected for your procedure and direct you during the exam. You will be asked to stand, sit or lie in several different positions and to hold a small amount of fluid in your mouth before being asked to swallow while the imaging is performed .In some instances you may be asked to swallow barium coated marshmallows to assess  the motility of a solid food bolus. The exam can be recorded as a digital or video fluoroscopy procedure. POST PROCEDURE It will take 1-2 days for the barium to pass through your system. To facilitate this, it is important, unless otherwise directed, to increase your fluids for the next 24-48hrs and to resume your normal diet.  This test typically takes about 30 minutes to perform. ____________________________________________________   Dennis Bast should drink liquids as tolerated.  Several medications have been called to your pharmacy for you to pick up: fluconazole, zofran, lansoprazole, and carafate  I appreciate the opportunity to care for you. Thank you for choosing me and [facility],  Dr. Harl Bowie

## 2020-01-29 NOTE — Telephone Encounter (Signed)
Received call from pt sister Olivia Mackie stating pt was not able to eat or drink anything over the weekend.  States it has been several weeks since the pt was able to eat due to radiation induced esophagitis.  Per MD pt needs to go to the emergency room for further evaluation and treatment with possible TPN.  Pt sister verbalized understanding and states she will take pt today.

## 2020-01-29 NOTE — ED Triage Notes (Signed)
Pt mother verbalizes pt poor intake for 20 days related to throwing up. Hx of bone cancer; receiving chemo.

## 2020-01-29 NOTE — ED Notes (Signed)
Pt in X ray

## 2020-01-29 NOTE — H&P (Signed)
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 She is only drinking alkaline water but otherwise has not been able to eat anything.  Per her mother she does not like to drink any sugary juices, boost breeze, Gatorade, Pedialyte, Jell-O or even popsicles.  States they are too sweet and makes her nauseous.  She has not tried any broth or clear soups.  Her chest hurts when she tries to eat anything and sometimes even with saliva, she feels the chest hurts all the time.  No significant improvement with Carafate tablets, she dissolves the tablets prior to drinking it as a slurry.  Oncologic history positive for breast cancer s/p lumpectomy, adjuvant chemotherapy and radiation therapy  Colonoscopy November 02, 2017: Removal of 2 small sessile polyps [tubular adenoma X1 and hyperplastic polypX1], internal hemorrhoids otherwise normal exam   Review of Systems:  Constitutional:  Positive weight loss, negative night sweats, Fevers, chills, fatigue.  HEENT:  No headaches,Positive difficulty swallowing,Tooth/dental problems,Sore throat,  No sneezing, itching, ear ache, nasal congestion, post nasal drip,  Cardio-vascular:  No chest pain, Orthopnea, PND, swelling in lower extremities, anasarca, dizziness, palpitations  GI:  Positive heartburn,  negative indigestion, abdominal pain,Positive nausea, vomiting, negative diarrhea, change in bowel habits, loss of appetite  Resp:  No shortness of breath with exertion or at rest. No excess mucus, no productive cough, No non-productive cough, No coughing up of blood.No change in color of mucus.No wheezing.No chest wall deformity  Skin:  no rash or lesions.  GU:  no dysuria, change in color of urine, no urgency or frequency. No flank pain.  Musculoskeletal:  No joint pain or swelling. No decreased range of motion. No back pain.  Psych:  No change in mood or affect. No depression or anxiety. No memory loss.   Past Medical History:  Diagnosis Date  . Arthritis    knees, elbows  . Borderline glaucoma of right eye   . BRCA2 gene mutation positive 02/17/2018   BRCA2 c.4552del (p.Glu1518Asnfs*25)  Result reported out on 02/15/2018.   . Breast cancer ([place]) 05/2017   left  . Chronic back pain   . Deaf    per pt born hearing and at age 63 1/2 lost hearing , was told by mother unknown cause, can miminally hear in left and no hearing on right   . Depression   . Dyspnea    states some SOB with ADLs and anemia   . Elevated cancer antigen 125 (CA 125)   . Family history of breast cancer   . Family history of breast cancer   . Family history of colon cancer   . Genetic testing 02/17/2018   The Common Hereditary Cancer Panel offered by [facility] includes sequencing and/or deletion duplication testing of the following 47 genes: APC, ATM, AXIN2, BARD1, BMPR1A, BRCA1, BRCA2, BRIP1, CDH1, CDKN2A (p14ARF), CDKN2A (p16INK4a), CKD4, CHEK2, CTNNA1, DICER1, EPCAM (Deletion/duplication testing only), GREM1 (promoter region deletion/duplication  testing only), KIT, MEN1, MLH1, MSH2, MSH3, MSH6, MU  . History of cancer chemotherapy    left breast cancer 09-08-2017 to 10-09-2017;  fallopion tube cancer  12-15-2017  to 06-28-2018  . 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 oncologist-  dr [name]   11-04-2017   s/p  LAVH w/ BSO,  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;  completed chemo 06-28-2018  . History of external beam radiation therapy    left breast  11-25-2017  to 01-07-2018  . Hyperlipidemia   . Hypertension    followed by pcp   (10-05-2019  per pt never had stress test)  . IDA (iron deficiency anemia)   . 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 Non-insulin dependent type 2 diabetes mellitus ([place])    followed by pcp   (10-05-2019 per pt check cbg every other day in AM,  fasting cbg-- 105)  . Numbness of right thumb   . Wears glasses    Past Surgical History:  Procedure Laterality Date  . BREAST LUMPECTOMY WITH RADIOACTIVE SEED AND SENTINEL LYMPH NODE BIOPSY Left 06/09/2017   Procedure: BREAST LUMPECTOMY WITH RADIOACTIVE SEED AND SENTINEL LYMPH NODE BIOPSY;  Surgeon: Excell Seltzer, MD;  Location: [place] [facility];  Service: General;  Laterality: Left;  . COLONOSCOPY  11/02/2017   polyps  . HERNIA REPAIR    . HIATAL HERNIA REPAIR  09/2019  . LAPAROSCOPIC ASSISTED VAGINAL HYSTERECTOMY N/A 11/04/2017   Procedure: LAPAROSCOPIC ASSISTED VAGINAL HYSTERECTOMY;  Surgeon: [name], MD;  Location: [facility];  Service: Gynecology;  Laterality: N/A;  . LAPAROSCOPIC BILATERAL SALPINGO OOPHERECTOMY Bilateral 11/04/2017   Procedure: LAPAROSCOPIC BILATERAL SALPINGO OOPHORECTOMY;  Surgeon: [name], MD;  Location: [facility];  Service: Gynecology;  Laterality: Bilateral;  . LAPAROSCOPY N/A 10/09/2019   Procedure: LAPAROSCOPY DIAGNOSTIC WITH PERITONEAL BIOPSIES;  Surgeon: [name], MD;  Location: [facility];  Service: Gynecology;   Laterality: N/A;  . PORTACATH PLACEMENT Right 06/09/2017   Procedure: INSERTION PORT-A-CATH WITH [place];  Surgeon: Excell Seltzer, MD;  Location: [place];  Service: General;  Laterality: Right;  . PORTACATH PLACEMENT Right 11/13/2019   Procedure: INSERTION PORT-A-CATH WITH ULTRASOUND GUIDANCE;  Surgeon: Rolm Bookbinder, MD;  Location: [place];  Service: General;  Laterality: Right;  . TUBAL LIGATION  02/02/2002   @[facility]    [facility]  . UPPER GI ENDOSCOPY     Social History:  reports that she has never smoked. She has never used smokeless tobacco. She reports that she does not drink alcohol or use drugs.  No Known Allergies  Family History  Problem Relation Age of Onset  . Hypertension Mother   . Lupus Mother   . Diabetes Father   . Hypertension Sister   . Diabetes Paternal Grandmother   . Colon cancer Paternal Grandmother 53  . CAD Brother   . Diabetes Brother   . Heart attack Brother   . Breast cancer Maternal Aunt        dx >50  . Heart attack Paternal Grandfather   . Breast cancer Maternal Aunt        dx under 5  . Breast cancer  Maternal Aunt        dx  under 50    Prior to Admission medications   Medication Sig Start Date End Date Taking? Authorizing Provider  Accu-Chek FastClix Lancets MISC Use as instructed. Inject into the skin twice daily Patient not taking: Reported on 01/29/2020 12/26/18   [name], NP  atorvastatin (LIPITOR) 40 MG tablet Take 1 tablet (40 mg total) by mouth daily. Patient not taking: Reported on 01/29/2020 06/23/19   Fulp, Ander Gaster, MD  Blood Glucose Calibration (ACCU-CHEK GUIDE CONTROL) LIQD 1 each by In Vitro route once as needed for up to 1 dose. Patient not taking: Reported on 01/29/2020 12/26/18   [name], NP  cholecalciferol (VITAMIN D) 1000 units tablet Take 1,000 Units by mouth daily.    [provider]  docusate sodium (COLACE) 100 MG capsule Take 1 capsule (100 mg total) by mouth 2 (two)  times daily. Patient not taking: Reported on 01/29/2020 12/26/19   [name], MD  fentaNYL (DURAGESIC) 25 MCG/HR Place 1 patch onto the skin every 3 (three) days. Patient not taking: Reported on 01/29/2020 01/08/20   [name], MD  ferrous sulfate 325 (65 FE) MG tablet TAKE 1 TABLET (325 MG TOTAL) BY MOUTH DAILY BEFORE BREAKFAST. Patient not taking: Reported on 01/29/2020 12/19/19 03/18/20  Fulp, Ander Gaster, MD  fluconazole (DIFLUCAN) 10 MG/ML suspension Take 10 mLs (100 mg total) by mouth daily for 7 days. 01/29/20 02/05/20  Mauri Pole, MD  glimepiride (AMARYL) 2 MG tablet Take 1 tablet (2 mg total) by mouth daily with breakfast. To lower blood sugar Patient not taking: Reported on 01/29/2020 08/04/19   Fulp, Cammie, MD  glucose blood (ACCU-CHEK GUIDE) test strip Use as instructed. Check blood glucose by fingerstick twice per day. Patient not taking: Reported on 01/29/2020 12/26/18   [name], NP  Lancet Devices [facility]) lancets Use as instructed Patient not taking: Reported on 01/29/2020 12/17/14   Lance Bosch, NP  lansoprazole (PREVACID SOLUTAB) 30 MG disintegrating tablet Take 1 tablet (30 mg total) by mouth in the morning and at bedtime. 01/29/20   Mauri Pole, MD  lidocaine (XYLOCAINE) 2 % solution 1 teaspoon mixed with 30 ml of Mylanta q 3 hours prn GERD Patient not taking: Reported on 01/29/2020 01/19/20   Sandi Mealy E., PA-C  lisinopril (ZESTRIL) 10 MG tablet Take 1 tablet (10 mg total) by mouth daily. To lower blood pressure Patient not taking: Reported on 01/29/2020 08/24/19   Fulp, Cammie, MD  metFORMIN (GLUCOPHAGE-XR) 500 MG 24 hr tablet TAKE 2 TABLETS BY MOUTH EVERY DAY WITH BREAKFAST Patient not taking: Reported on 01/29/2020 12/01/19   Fulp, Cammie, MD  methocarbamol (ROBAXIN) 500 MG tablet Take 2 tablets (1,000 mg total) by mouth at bedtime as needed for muscle spasms. Patient not taking: Reported on 01/29/2020 12/05/19   [name], MD  ondansetron  (ZOFRAN ODT) 4 MG disintegrating tablet Take 1 tablet (4 mg total) by mouth every 8 (eight) hours as needed for nausea or vomiting. 01/29/20   Mauri Pole, MD  pantoprazole (PROTONIX) 20 MG tablet Take 1 tablet (20 mg total) by mouth daily. Patient not taking: Reported on 01/29/2020 01/08/20   [name], MD  promethazine (PHENERGAN) 12.5 MG suppository Place 1 suppository (12.5 mg total) rectally every 6 (six) hours as needed for nausea or vomiting. Patient not taking: Reported on 01/29/2020 01/16/20   Hayden Pedro, PA-C  sucralfate (CARAFATE) 1 GM/10ML suspension Take 1 gram  before each meal and at bedtime 01/29/20   Mauri Pole, MD  talazoparib tosylate (TALZENNA) 1 MG capsule Take 1 capsule (1 mg total) by mouth daily. Patient not taking: Reported on 01/29/2020 01/08/20   [name], MD     Consultants:  GI  Procedures/Significant Events:    I have personally reviewed and interpreted all radiology studies and my findings are as above.   VENTILATOR SETTINGS:    Cultures   Antimicrobials:    Devices    LINES / TUBES:      Continuous Infusions: . chlorproMAZINE (THORAZINE) IV    . dextrose 5 % and 0.9% NaCl    . methocarbamol (ROBAXIN) IV    . potassium chloride 10 mEq (01/29/20 1723)  . potassium chloride      Physical Exam: Vitals:   01/29/20 1420 01/29/20 1725 01/29/20 1730 01/29/20 1800  BP: 108/63 128/79 126/77 121/71  Pulse: 96 88 89 85  Resp: 18 18  18   Temp: 99.3 F (37.4 C)     TempSrc: Oral     SpO2: 100% 99% 100% 99%    Wt Readings from Last 3 Encounters:  01/29/20 61.4 kg  01/26/20 62.5 kg  01/19/20 63.9 kg    General: A/O x4, no acute respiratory distress (answers obtained through mother who translates sign language for her) Eyes: negative scleral hemorrhage, negative anisocoria, negative icterus ENT: Negative Runny nose, negative gingival bleeding DEAF Neck:  Negative scars, masses, torticollis, lymphadenopathy,  JVD Lungs: Clear to auscultation bilaterally without wheezes or crackles Cardiovascular: Regular rate and rhythm without murmur gallop or rub normal S1 and S2, Port-A-Cath right chest wall Abdomen: negative abdominal pain, nondistended, positive soft, bowel sounds, no rebound, no ascites, no appreciable mass Extremities: No significant cyanosis, clubbing, or edema bilateral lower extremities Skin: Negative rashes, lesions, ulcers Psychiatric:  Negative depression, negative anxiety, negative fatigue, negative mania  Central nervous system:  Cranial nerves II through XII intact, tongue/uvula midline, all extremities muscle strength 5/5, sensation intact throughout, negative dysarthria, negative expressive aphasia, negative receptive aphasia.        Labs on Admission:  Basic Metabolic Panel: Recent Labs  Lab 01/26/20 0825 01/29/20 1606  NA 138 137  K 3.0* 3.1*  CL 103 99  CO2 22 22  GLUCOSE 97 95  BUN 5* 8  CREATININE 0.57 0.45  CALCIUM 7.5* 7.7*  MG 1.8 2.2  PHOS 1.1*  --    Liver Function Tests: Recent Labs  Lab 01/26/20 0825 01/29/20 1606  AST 23 23  ALT 12 16  ALKPHOS 89 85  BILITOT 0.3 0.9  PROT 7.4 8.1  ALBUMIN 2.8* 3.3*   Recent Labs  Lab 01/29/20 1606  LIPASE 18   No results for input(s): AMMONIA in the last 168 hours. CBC: Recent Labs  Lab 01/26/20 0825 01/29/20 1606  WBC 5.1 5.6  NEUTROABS 3.9  --   HGB 9.9* 10.9*  HCT 30.2* 33.8*  MCV 90.7 91.6  PLT 87* 120*   Cardiac Enzymes: No results for input(s): CKTOTAL, CKMB, CKMBINDEX, TROPONINI in the last 168 hours.  BNP (last 3 results) No results for input(s): BNP in the last 8760 hours.  ProBNP (last 3 results) No results for input(s): PROBNP in the last 8760 hours.  CBG: No results for input(s): GLUCAP in the last 168 hours.  Radiological Exams on Admission: No results found.  EKG: Independently reviewed.  Pending  Assessment/Plan Active Problems:   Hearing loss   DM type 2 (diabetes  mellitus,  type 2) ([place])   Fallopian tube cancer, carcinoma, right ([place])   Bone metastases ([place])   Dysphagia   Radiation-induced esophagitis   Esophageal candidiasis ([facility])  Radiation induced esophagitis? -GI has evaluated patient and it appears they plan to conduct  swallow study/esophagram with Gastrografin R/O large mucosal ulcerations or tears  Esophageal candidiasis -Per GI recommendation empirically treat  fluconazole suspension 100 mg daily for 7 days -GI cocktail PRN pain -Prevacid Solutab 30 mg BID -Carafate suspension 1 g qac + qhs -Per patient not interested in a PEG tube for nutrition.  Nutrition -D5-0.9% saline 70m/hr -Appears that GI may have wanted patient to start TPN, will need to investigate further in the a.m. did they want to start TPN and cannot be run through Port-A-Cath.  Hypokalemia -Potassium goal> 4 -Potassium IV 60 mEq  DM type II controlled without complication -109/8/1191hemoglobin A1c= 6.3 -A1c pending -Lipid panel pending -Sensitive SSI  Deaf -Stable -Mother acts as patient's interpreter of sign language    Code Status: Full (DVT Prophylaxis: Lovenox Family Communication: 5/17 mother at bedside for discussion of plan of care answered all questions.  NOTE mother acts as patient's interpreter of sign language. Disposition Plan:  Disposition Plan:  Status is: Inpatient    Dispo: The patient is from: Home              Anticipated d/c is to: Home              Anticipated d/c date is: 27 May              Patient currently unstable     Data Reviewed: Care during the described time interval was provided by me .  I have reviewed this patient's available data, including medical history, events of note, physical examination, and all test results as part of my evaluation.   The patient is critically ill with multiple organ systems failure and requires high complexity decision making for assessment and support, frequent evaluation and titration  of therapies, application of advanced monitoring technologies and extensive interpretation of multiple databases. Critical Care Time devoted to patient care services described in this note  Time spent: 785minutes   Devarion Mcclanahan, C[facility]fatherHospitalists Pager [phone]

## 2020-01-29 NOTE — Telephone Encounter (Signed)
Because of severe esophagitis patient has not been able to take any oral food or liquids for the past several weeks.  She is getting weaker and weaker. Because of difficulties with PEG tube placement (given recent radiation), I recommend TPN to be started in the hospital. We have tried a number of ways to avoid this hospitalization but home health was also unable to obtain TPN for her home.

## 2020-01-30 ENCOUNTER — Encounter: Payer: Self-pay | Admitting: Licensed Clinical Social Worker

## 2020-01-30 ENCOUNTER — Inpatient Hospital Stay: Payer: Self-pay

## 2020-01-30 DIAGNOSIS — H9190 Unspecified hearing loss, unspecified ear: Secondary | ICD-10-CM

## 2020-01-30 LAB — MAGNESIUM
Magnesium: 2 mg/dL (ref 1.7–2.4)
Magnesium: 1.7 mg/dL (ref 1.7–2.4)

## 2020-01-30 LAB — COMPREHENSIVE METABOLIC PANEL
ALT: 14 U/L (ref 0–44)
AST: 21 U/L (ref 15–41)
Albumin: 2.9 g/dL — ABNORMAL LOW (ref 3.5–5.0)
Alkaline Phosphatase: 76 U/L (ref 38–126)
Anion gap: 12 (ref 5–15)
BUN: <5 mg/dL — ABNORMAL LOW (ref 6–20)
CO2: 21 mmol/L — ABNORMAL LOW (ref 22–32)
Calcium: 7.1 mg/dL — ABNORMAL LOW (ref 8.9–10.3)
Chloride: 106 mmol/L (ref 98–111)
Creatinine, Ser: 0.45 mg/dL (ref 0.44–1.00)
GFR calc Af Amer: >60 mL/min (ref >60)
GFR calc non Af Amer: >60 mL/min (ref >60)
Glucose, Bld: 129 mg/dL — ABNORMAL HIGH (ref 70–99)
Potassium: 3.3 mmol/L — ABNORMAL LOW (ref 3.5–5.1)
Sodium: 139 mmol/L (ref 135–145)
Total Bilirubin: 0.5 mg/dL (ref 0.3–1.2)
Total Protein: 7.1 g/dL (ref 6.5–8.1)

## 2020-01-30 LAB — GLUCOSE, CAPILLARY
Glucose-Capillary: 123 mg/dL — ABNORMAL HIGH (ref 70–99)
Glucose-Capillary: 172 mg/dL — ABNORMAL HIGH (ref 70–99)
Glucose-Capillary: 155 mg/dL — ABNORMAL HIGH (ref 70–99)
Glucose-Capillary: 156 mg/dL — ABNORMAL HIGH (ref 70–99)

## 2020-01-30 LAB — LIPID PANEL
Cholesterol: 95 mg/dL (ref 0–200)
HDL: 18 mg/dL — ABNORMAL LOW (ref >40)
LDL Cholesterol: 57 mg/dL (ref 0–99)
Total CHOL/HDL Ratio: 5.3 RATIO
Triglycerides: 100 mg/dL (ref <150)
VLDL: 20 mg/dL (ref 0–40)

## 2020-01-30 LAB — CBC WITH DIFFERENTIAL/PLATELET
Abs Immature Granulocytes: 0.04 10*3/uL (ref 0.00–0.07)
Basophils Absolute: 0 10*3/uL (ref 0.0–0.1)
Basophils Relative: 1 %
Eosinophils Absolute: 0 10*3/uL (ref 0.0–0.5)
Eosinophils Relative: 1 %
HCT: 28.6 % — ABNORMAL LOW (ref 36.0–46.0)
Hemoglobin: 9.3 g/dL — ABNORMAL LOW (ref 12.0–15.0)
Immature Granulocytes: 1 %
Lymphocytes Relative: 15 %
Lymphs Abs: 0.7 10*3/uL (ref 0.7–4.0)
MCH: 29.5 pg (ref 26.0–34.0)
MCHC: 32.5 g/dL (ref 30.0–36.0)
MCV: 90.8 fL (ref 80.0–100.0)
Monocytes Absolute: 0.6 10*3/uL (ref 0.1–1.0)
Monocytes Relative: 13 %
Neutro Abs: 3.1 10*3/uL (ref 1.7–7.7)
Neutrophils Relative %: 69 %
Platelets: 105 10*3/uL — ABNORMAL LOW (ref 150–400)
RBC: 3.15 MIL/uL — ABNORMAL LOW (ref 3.87–5.11)
RDW: 17.6 % — ABNORMAL HIGH (ref 11.5–15.5)
WBC: 4.5 10*3/uL (ref 4.0–10.5)
nRBC: 0 % (ref 0.0–0.2)

## 2020-01-30 LAB — HIV ANTIBODY (ROUTINE TESTING W REFLEX): HIV Screen 4th Generation wRfx: NONREACTIVE

## 2020-01-30 LAB — PROTIME-INR
INR: 1.1 (ref 0.8–1.2)
Prothrombin Time: 13.8 seconds (ref 11.4–15.2)

## 2020-01-30 LAB — APTT: aPTT: 34 seconds (ref 24–36)

## 2020-01-30 LAB — POTASSIUM
Potassium: 4 mmol/L (ref 3.5–5.1)
Potassium: 3.2 mmol/L — ABNORMAL LOW (ref 3.5–5.1)

## 2020-01-30 LAB — PHOSPHORUS
Phosphorus: <1 mg/dL — CL (ref 2.5–4.6)
Phosphorus: 1.4 mg/dL — ABNORMAL LOW (ref 2.5–4.6)

## 2020-01-30 MED ORDER — CALCIUM GLUCONATE-NACL 1-0.675 GM/50ML-% IV SOLN
1.0000 g | Freq: Once | INTRAVENOUS | Status: DC
  Filled 2020-01-30: qty 50

## 2020-01-30 MED ORDER — CALCIUM GLUCONATE-NACL 1-0.675 GM/50ML-% IV SOLN
1.0000 g | Freq: Once | INTRAVENOUS | Status: AC
  Administered 2020-01-30: 1000 mg via INTRAVENOUS
  Filled 2020-01-30: qty 50

## 2020-01-30 MED ORDER — POTASSIUM PHOSPHATES 15 MMOLE/5ML IV SOLN
30.0000 mmol | Freq: Once | INTRAVENOUS | Status: AC
  Administered 2020-01-30: 30 mmol via INTRAVENOUS
  Filled 2020-01-30: qty 10

## 2020-01-30 MED ORDER — POTASSIUM PHOSPHATES 15 MMOLE/5ML IV SOLN
40.0000 mmol | Freq: Once | INTRAVENOUS | Status: AC
  Administered 2020-01-30: 40 mmol via INTRAVENOUS
  Filled 2020-01-30: qty 13.33

## 2020-01-30 MED ORDER — FLUCONAZOLE 40 MG/ML PO SUSR
100.0000 mg | Freq: Every day | ORAL | Status: DC
  Administered 2020-01-30: 100 mg via ORAL
  Filled 2020-01-30 (×2): qty 2.5

## 2020-01-30 MED ORDER — CHLORHEXIDINE GLUCONATE CLOTH 2 % EX PADS
6.0000 | MEDICATED_PAD | Freq: Every day | CUTANEOUS | Status: DC
  Administered 2020-01-30 – 2020-01-31 (×2): 6 via TOPICAL

## 2020-01-30 MED ORDER — POTASSIUM CHLORIDE 10 MEQ/100ML IV SOLN
10.0000 meq | INTRAVENOUS | Status: AC
  Administered 2020-01-30: 10 meq via INTRAVENOUS
  Filled 2020-01-30: qty 100

## 2020-01-30 MED ORDER — LIDOCAINE-PRILOCAINE 2.5-2.5 % EX CREA
TOPICAL_CREAM | CUTANEOUS | Status: DC | PRN
  Administered 2020-01-30: 1 via TOPICAL
  Filled 2020-01-30: qty 5

## 2020-01-30 MED ORDER — SODIUM CHLORIDE 0.9% FLUSH
10.0000 mL | INTRAVENOUS | Status: DC | PRN
  Administered 2020-02-01: 10 mL

## 2020-01-30 MED ORDER — POTASSIUM CHLORIDE 10 MEQ/50ML IV SOLN
10.0000 meq | INTRAVENOUS | Status: DC
  Filled 2020-01-30 (×2): qty 50

## 2020-01-30 MED ORDER — POTASSIUM CHLORIDE 10 MEQ/100ML IV SOLN: 10.0000 meq | INTRAVENOUS | Status: AC

## 2020-01-30 MED ORDER — CHLORHEXIDINE GLUCONATE CLOTH 2 % EX PADS
6.0000 | MEDICATED_PAD | Freq: Every day | CUTANEOUS | Status: DC
  Administered 2020-01-31 – 2020-02-03 (×4): 6 via TOPICAL

## 2020-01-30 MED ORDER — POTASSIUM PHOSPHATES 15 MMOLE/5ML IV SOLN
45.0000 mmol | Freq: Once | INTRAVENOUS | Status: DC
  Filled 2020-01-30: qty 15

## 2020-01-30 NOTE — Progress Notes (Signed)
Pharmacy - electrolyte replacement/TPN  Assessment:    Please see note from Ulice Dash, PharmD earlier today for full details.  Briefly, 59 y.o. female started on TPN for severe dysphagia. Electrolytes replaced this AM and f/u labs ordered after completion of infusions   Mg WNL, Phos (1.4) and K (3.2) both low  SCr WNL  Plan:   KPhos 30 mmol IV x 1 (provides 45 mEq potassium  F/u labs in AM  Reuel Boom, PharmD, BCPS [phone] 01/30/2020, 7:48 PM

## 2020-01-30 NOTE — ED Provider Notes (Signed)
[facility] Provider Note   CSN: [id number] Arrival date & time: 01/29/20  1400     History Chief Complaint  Patient presents with   Abdominal Pain   Emesis    Erin [name] is a 59 y.o. female.  Presented to ER with concern for poor p.o. intake.  For the past month patient has had relatively minimal to 0 p.o. intake.  Has had severe esophagitis from radiation.  Patient reports that she has been having worsening of her dysphagia, pain, nausea, has not had any p.o. intake over the past couple weeks.  Additional history obtained via chart review, review of recent GI note, oncology notes.  Additional history obtained by discussion with her oncologist.  Oncology has tried to set up outpatient TPN however has been unsuccessful and is requesting the patient be admitted to the hospital to initiate this.  Language Teacher, English as a foreign language used, ASL  HPI     Past Medical History:  Diagnosis Date   Arthritis    knees, elbows   Borderline glaucoma of right eye    BRCA2 gene mutation positive 02/17/2018   BRCA2 c.4552del (p.Glu1518Asnfs*25)  Result reported out on 02/15/2018.    Breast cancer ([place]) 05/2017   left   Chronic back pain    Deaf    per pt born hearing and at age 14 1/2 lost hearing , was told by mother unknown cause, can miminally hear in left and no hearing on right    Depression    Dyspnea    states some SOB with ADLs and anemia    Elevated cancer antigen 125 (CA 125)    Family history of breast cancer    Family history of breast cancer    Family history of colon cancer    Genetic testing 02/17/2018   The Common Hereditary Cancer Panel offered by [facility] includes sequencing and/or deletion duplication testing of the following 47 genes: APC, ATM, AXIN2, BARD1, BMPR1A, BRCA1, BRCA2, BRIP1, CDH1, CDKN2A (p14ARF), CDKN2A (p16INK4a), CKD4, CHEK2, CTNNA1, DICER1, EPCAM (Deletion/duplication testing only), GREM1 (promoter region deletion/duplication testing  only), KIT, MEN1, MLH1, MSH2, MSH3, MSH6, MU   History of cancer chemotherapy    left breast cancer 09-08-2017 to 10-09-2017;  fallopion tube cancer  12-15-2017  to 06-28-2018   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 oncologist-  dr [name]   11-04-2017  s/p  LAVH w/ BSO,  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;  completed chemo 06-28-2018   History of external beam radiation therapy    left breast  11-25-2017  to 01-07-2018   Hyperlipidemia    Hypertension    followed by pcp   (10-05-2019  per pt never had stress test)   IDA (iron deficiency anemia)    Malignant neoplasm of lower-outer quadrant of left breast of female, estrogen receptor negative [facility]) oncologist-- dr [name]   dx 08/ 2018--- Stage IA, DCIS,  ER/ PR negative,  HER-2 positive;  06-09-2017 s/p left breast lumpectomy with node dissection;  completed chemo 10-09-2017  and radiation 01-07-2018/  hercepton completed 06-28-2018   Non-insulin dependent type 2 diabetes mellitus ([place])    followed by pcp   (10-05-2019 per pt check cbg every other day in AM,  fasting cbg-- 105)   Numbness of right thumb    Wears glasses     Patient Active Problem List   Diagnosis Date Noted   Dysphagia 01/29/2020   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  Esophageal candidiasis ([place]) 01/29/2020   Bone metastases ([place]) 12/28/2019   Pain from bone metastases ([place]) 12/28/2019   Goals of care, counseling/discussion 11/07/2019   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   Elevated CA-125 10/09/2019   Paresthesia 04/17/2019   BRCA2 gene mutation positive 02/17/2018   Genetic testing 02/17/2018   Family history of breast cancer    Family history of colon cancer    Fallopian tube cancer, carcinoma, 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/22/2017   S/P vaginal hysterectomy 11/04/2017   Ovarian mass, left 08/26/2017   Menorrhagia 08/26/2017   Port-A-Cath in place 07/23/2017   Malignant neoplasm of lower-outer quadrant of left breast of female, estrogen receptor negative ([place]) 05/13/2017   Abdominal bloating 04/02/2017   Chronic right-sided low back pain without sciatica 07/02/2016   DM type 2 (diabetes mellitus, type 2) ([place]) 11/18/2014   Intermittent constipation 06/06/2010   UNSPECIFIED IRON DEFICIENCY ANEMIA 04/22/2010   DEPRESSION 04/10/2010   Hearing loss 04/10/2010    Past Surgical History:  Procedure Laterality Date   BREAST LUMPECTOMY WITH RADIOACTIVE SEED AND SENTINEL LYMPH NODE BIOPSY Left 06/09/2017   Procedure: BREAST LUMPECTOMY WITH RADIOACTIVE SEED AND SENTINEL LYMPH NODE BIOPSY;  Surgeon: Excell Seltzer, MD;  Location: [facility];  Service: General;  Laterality: Left;   COLONOSCOPY  11/02/2017   polyps   HERNIA REPAIR     HIATAL HERNIA REPAIR  09/2019   LAPAROSCOPIC ASSISTED VAGINAL HYSTERECTOMY N/A 11/04/2017   Procedure: LAPAROSCOPIC ASSISTED VAGINAL HYSTERECTOMY;  Surgeon: [name], MD;  Location: [facility];  Service: Gynecology;  Laterality: N/A;   LAPAROSCOPIC BILATERAL SALPINGO OOPHERECTOMY Bilateral 11/04/2017   Procedure: LAPAROSCOPIC BILATERAL SALPINGO OOPHORECTOMY;  Surgeon: [name], MD;  Location: [facility];  Service: Gynecology;  Laterality: Bilateral;   LAPAROSCOPY N/A 10/09/2019   Procedure: LAPAROSCOPY DIAGNOSTIC WITH PERITONEAL BIOPSIES;  Surgeon: [name], MD;  Location: [facility];  Service: Gynecology;  Laterality: N/A;   PORTACATH PLACEMENT Right 06/09/2017   Procedure: INSERTION PORT-A-CATH WITH [place];  Surgeon: Excell Seltzer, MD;  Location: [facility];  Service: General;  Laterality: Right;   PORTACATH PLACEMENT Right 11/13/2019   Procedure: INSERTION PORT-A-CATH WITH ULTRASOUND GUIDANCE;  Surgeon:  Rolm Bookbinder, MD;  Location: [facility];  Service: General;  Laterality: Right;   TUBAL LIGATION  02/02/2002   _0    PPTL   UPPER GI ENDOSCOPY       OB History    Gravida  3   Para  2   Term      Preterm      AB  1   Living  2     SAB  1   TAB      Ectopic      Multiple      Live Births              Family History  Problem Relation Age of Onset   Hypertension Mother    Lupus Mother    Diabetes Father    Hypertension Sister    Diabetes Paternal Grandmother    Colon cancer Paternal Grandmother 75   CAD Brother    Diabetes Brother    Heart attack Brother    Breast cancer Maternal Aunt        dx >50   Heart attack Paternal Grandfather    Breast  cancer Maternal Aunt        dx under 78   Breast cancer Maternal Aunt        dx  under 50    Social History   Tobacco Use   Smoking status: Never Smoker   Smokeless tobacco: Never Used  Substance Use Topics   Alcohol use: No   Drug use: Never    Home Medications Prior to Admission medications   Medication Sig Start Date End Date Taking? Authorizing Provider  Accu-Chek FastClix Lancets MISC Use as instructed. Inject into the skin twice daily 12/26/18  Yes [name], NP  atorvastatin (LIPITOR) 40 MG tablet Take 1 tablet (40 mg total) by mouth daily. 06/23/19  Yes Fulp, Cammie, MD  Blood Glucose Calibration (ACCU-CHEK GUIDE CONTROL) LIQD 1 each by In Vitro route once as needed for up to 1 dose. 12/26/18  Yes [name], NP  cholecalciferol (VITAMIN D) 1000 units tablet Take 1,000 Units by mouth daily.   Yes [provider]  docusate sodium (COLACE) 100 MG capsule Take 1 capsule (100 mg total) by mouth 2 (two) times daily. 12/26/19  Yes [name], MD  ferrous sulfate 325 (65 FE) MG tablet TAKE 1 TABLET (325 MG TOTAL) BY MOUTH DAILY BEFORE BREAKFAST. 12/19/19 03/18/20 Yes Fulp, Cammie, MD  glimepiride (AMARYL) 2 MG tablet Take 1 tablet (2 mg total) by  mouth daily with breakfast. To lower blood sugar 08/04/19  Yes Fulp, Cammie, MD  glucose blood (ACCU-CHEK GUIDE) test strip Use as instructed. Check blood glucose by fingerstick twice per day. 12/26/18  Yes [name], NP  Lancet Devices [facility]) lancets Use as instructed 12/17/14  Yes Lance Bosch, NP  lisinopril (ZESTRIL) 10 MG tablet Take 1 tablet (10 mg total) by mouth daily. To lower blood pressure 08/24/19  Yes Fulp, Cammie, MD  metFORMIN (GLUCOPHAGE-XR) 500 MG 24 hr tablet TAKE 2 TABLETS BY MOUTH EVERY DAY WITH BREAKFAST Patient taking differently: Take 1,000 mg by mouth daily with breakfast.  12/01/19  Yes Fulp, Cammie, MD  methocarbamol (ROBAXIN) 500 MG tablet Take 2 tablets (1,000 mg total) by mouth at bedtime as needed for muscle spasms. 12/05/19  Yes [name], MD  promethazine (PHENERGAN) 12.5 MG suppository Place 1 suppository (12.5 mg total) rectally every 6 (six) hours as needed for nausea or vomiting. 01/16/20  Yes Hayden Pedro, PA-C  fentaNYL (DURAGESIC) 25 MCG/HR Place 1 patch onto the skin every 3 (three) days. Patient not taking: Reported on 01/29/2020 01/08/20   [name], MD  fluconazole (DIFLUCAN) 10 MG/ML suspension Take 10 mLs (100 mg total) by mouth daily for 7 days. Patient not taking: Reported on 01/29/2020 01/29/20 02/05/20  Mauri Pole, MD  lansoprazole (PREVACID SOLUTAB) 30 MG disintegrating tablet Take 1 tablet (30 mg total) by mouth in the morning and at bedtime. Patient not taking: Reported on 01/29/2020 01/29/20   Mauri Pole, MD  lidocaine (XYLOCAINE) 2 % solution 1 teaspoon mixed with 30 ml of Mylanta q 3 hours prn GERD Patient not taking: Reported on 01/29/2020 01/19/20   Sandi Mealy E., PA-C  ondansetron (ZOFRAN ODT) 4 MG disintegrating tablet Take 1 tablet (4 mg total) by mouth every 8 (eight) hours as needed for nausea or vomiting. Patient not taking: Reported on 01/29/2020 01/29/20   Mauri Pole, MD  pantoprazole  (PROTONIX) 20 MG tablet Take 1 tablet (20 mg total) by mouth daily. Patient not taking: Reported on 01/29/2020 01/08/20  [name], MD  sucralfate (CARAFATE) 1 GM/10ML suspension Take 1 gram before each meal and at bedtime 01/29/20   Mauri Pole, MD  talazoparib tosylate (TALZENNA) 1 MG capsule Take 1 capsule (1 mg total) by mouth daily. Patient not taking: Reported on 01/29/2020 01/08/20   [name], MD    Allergies    Patient has no known allergies.  Review of Systems   Review of Systems  Constitutional: Negative for chills and fever.  HENT: Negative for ear pain and sore throat.   Eyes: Negative for pain and visual disturbance.  Respiratory: Negative for cough and shortness of breath.   Cardiovascular: Negative for chest pain and palpitations.  Gastrointestinal: Positive for abdominal pain, nausea and vomiting.  Genitourinary: Negative for dysuria and hematuria.  Musculoskeletal: Negative for arthralgias and back pain.  Skin: Negative for color change and rash.  Neurological: Negative for seizures and syncope.  All other systems reviewed and are negative.   Physical Exam Updated Vital Signs BP 121/74 (BP Location: Right Arm)    Pulse 87    Temp 98.7 F (37.1 C) (Oral)    Resp 16    LMP 07/09/2017 (LMP Unknown)    SpO2 99%   Physical Exam Vitals and nursing note reviewed.  Constitutional:      General: She is not in acute distress.    Appearance: She is well-developed.  HENT:     Head: Normocephalic and atraumatic.  Eyes:     Conjunctiva/sclera: Conjunctivae normal.  Cardiovascular:     Rate and Rhythm: Normal rate and regular rhythm.     Heart sounds: No murmur.  Pulmonary:     Effort: Pulmonary effort is normal. No respiratory distress.     Breath sounds: Normal breath sounds.  Abdominal:     General: Bowel sounds are normal.     Palpations: Abdomen is soft.     Tenderness: There is no abdominal tenderness.  Musculoskeletal:     Cervical back: Neck  supple.  Skin:    General: Skin is warm and dry.  Neurological:     Mental Status: She is alert.     ED Results / Procedures / Treatments   Labs (all labs ordered are listed, but only abnormal results are displayed) Labs Reviewed  COMPREHENSIVE METABOLIC PANEL - Abnormal; Notable for the following components:      Result Value   Potassium 3.1 (*)    Calcium 7.7 (*)    Albumin 3.3 (*)    Anion gap 16 (*)    All other components within normal limits  CBC - Abnormal; Notable for the following components:   RBC 3.69 (*)    Hemoglobin 10.9 (*)    HCT 33.8 (*)    RDW 17.5 (*)    Platelets 120 (*)    All other components within normal limits  URINALYSIS, ROUTINE W REFLEX MICROSCOPIC - Abnormal; Notable for the following components:   Hgb urine dipstick SMALL (*)    Ketones, ur 80 (*)    Leukocytes,Ua SMALL (*)    Bacteria, UA RARE (*)    All other components within normal limits  PREALBUMIN - Abnormal; Notable for the following components:   Prealbumin 10.4 (*)    All other components within normal limits  ALBUMIN - Abnormal; Notable for the following components:   Albumin 2.7 (*)    All other components within normal limits  BASIC METABOLIC PANEL - Abnormal; Notable for the following components:   Potassium 3.3 (*)  CO2 19 (*)    BUN 5 (*)    Creatinine, Ser 0.40 (*)    Calcium 6.8 (*)    All other components within normal limits  CBC - Abnormal; Notable for the following components:   RBC 3.47 (*)    Hemoglobin 10.3 (*)    HCT 31.4 (*)    RDW 17.4 (*)    Platelets 100 (*)    All other components within normal limits  SARS CORONAVIRUS 2 BY RT PCR (HOSPITAL ORDER, [facility])  LIPASE, BLOOD  MAGNESIUM  MAGNESIUM  COMPREHENSIVE METABOLIC PANEL  CBC WITH DIFFERENTIAL/PLATELET  MAGNESIUM  PHOSPHORUS  PROTIME-INR  APTT  HEMOGLOBIN A1C  LIPID PANEL  HIV ANTIBODY (ROUTINE TESTING W REFLEX)    EKG None  Radiology X-ray chest PA and  lateral  Result Date: 01/29/2020 CLINICAL DATA:  Poor intake for 20 days with emesis EXAM: CHEST - 2 VIEW COMPARISON:  CT 01/01/2020 FINDINGS: No consolidation, features of edema, pneumothorax, or effusion. The cardiomediastinal contours are unremarkable. Right IJ approach Port-A-Cath tip terminates in the mid SVC. Sclerotic lesions are seen in the T8 and T9 vertebrae. Additional known osseous metastases disease to the spine is poorly visualized on radiography. Sclerotic and lucent changes of the right glenoid may reflect additional site of metastatic disease. No other acute or worrisome osseous or soft tissue abnormality. IMPRESSION: 1. No acute cardiopulmonary abnormality. 2. Extensive osseous metastatic disease including the spine and right glenoid. Much of which is better visualized on comparison CT. Electronically Signed   By: Lovena Le M.D.   On: 01/29/2020 18:59    Procedures Procedures (including critical care time)  Medications Ordered in ED Medications  enoxaparin (LOVENOX) injection 40 mg (40 mg Subcutaneous Given 01/29/20 2055)  sodium chloride flush (NS) 0.9 % injection 3 mL (3 mLs Intravenous Given 01/29/20 2103)  dextrose 5 %-0.9 % sodium chloride infusion ( Intravenous New Bag/Given 01/29/20 2113)  acetaminophen (TYLENOL) tablet 650 mg (has no administration in time range)    Or  acetaminophen (TYLENOL) suppository 650 mg (has no administration in time range)  HYDROmorphone (DILAUDID) injection 0.5-1 mg (has no administration in time range)  methocarbamol (ROBAXIN) 500 mg in dextrose 5 % 50 mL IVPB (has no administration in time range)  ondansetron (ZOFRAN) tablet 4 mg ( Oral See Alternative 01/29/20 2052)    Or  ondansetron (ZOFRAN) injection 4 mg (4 mg Intravenous Given 01/29/20 2052)  chlorproMAZINE (THORAZINE) 12.5 mg in sodium chloride 0.9 % 25 mL IVPB (has no administration in time range)  alum & mag hydroxide-simeth (MAALOX/MYLANTA) 200-200-20 MG/5ML suspension 30 mL (has  no administration in time range)    And  lidocaine (XYLOCAINE) 2 % viscous mouth solution 15 mL (has no administration in time range)  dicyclomine (BENTYL) 10 MG/5ML solution 10 mg (has no administration in time range)  ondansetron (ZOFRAN) 4 MG/2ML injection (  See Procedure Record 01/29/20 2213)  sodium chloride flush (NS) 0.9 % injection 3 mL (3 mLs Intravenous Given 01/29/20 1729)  sodium chloride 0.9 % bolus 1,000 mL (1,000 mLs Intravenous Bolus from Bag 01/29/20 1724)  potassium chloride 10 mEq in 100 mL IVPB (10 mEq Intravenous New Bag/Given 01/29/20 1950)  potassium chloride 10 mEq in 100 mL IVPB (10 mEq Intravenous New Bag/Given (Non-Interop) 01/29/20 2211)    ED Course  I have reviewed the triage vital signs and the nursing notes.  Pertinent labs & imaging results that were available during my care of the  patient were reviewed by me and considered in my medical decision making (see chart for details).  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  Had been managed in outpatient setting by oncology, GI for severe dysphagia from radiation esophagitis.  Had been getting daily IV fluids in oncology office.  They had discussed setting up outpatient TPN however unsuccessful.  Dr. Payton Mccallum requesting patient be admitted for rehydration, initiation of TPN therapy.  Patient is agreeable to start this.  Consult hospitalist, Dr. Sherral Hammers who will accept patient.  Final Clinical Impression(s) / ED Diagnoses Final diagnoses:  Dysphagia    Rx / DC Orders ED Discharge Orders    None       Lucrezia Starch, MD 01/30/20 [phone]

## 2020-01-30 NOTE — Progress Notes (Signed)
[place] Work  CSW received multiple messages from 01/29/2020 (CSW was out of the office) regarding insurance needs for home health TPN.  CSW returned call to Carolynn Sayers with [employer] today. Per Pam, she has spoken with [facility] RN Alinda Sierras and has made referral to [facility] with [employer] to look into applying for full Medicaid.  CSW will continue to be available as needed.   Edwinna Areola Stoisits, LCSW

## 2020-01-30 NOTE — Progress Notes (Signed)
CRITICAL VALUE ALERT  Critical Value:  Phosphorus less than 1  Date & Time Notied:  01/30/20 at 0600  Provider Notified: On call hospitalist Sharlet Salina)  Orders Received/Actions taken: Pending orders from MD

## 2020-01-30 NOTE — Progress Notes (Signed)
Peripherally Inserted Central Catheter Placement  The IV Nurse has discussed with the patient and/or persons authorized to consent for the patient, the purpose of this procedure and the potential benefits and risks involved with this procedure.  The benefits include less needle sticks, lab draws from the catheter, and the patient may be discharged home with the catheter. Risks include, but not limited to, infection, bleeding, blood clot (thrombus formation), and puncture of an artery; nerve damage and irregular heartbeat and possibility to perform a PICC exchange if needed/ordered by physician.  Alternatives to this procedure were also discussed.  Bard Power PICC patient education guide, fact sheet on infection prevention and patient information card has been provided to patient /or left at bedside.    PICC Placement Documentation  PICC Double Lumen 01/30/20 PICC Right Brachial 35 cm 0 cm (Active)  Indication for Insertion or Continuance of Line Administration of hyperosmolar/irritating solutions (i.e. TPN, Vancomycin, etc.) 01/30/20 1326  Exposed Catheter (cm) 0 cm 01/30/20 1326  Site Assessment Clean;Dry;Intact 01/30/20 1326  Lumen #1 Status Flushed;Blood return noted;Saline locked 01/30/20 1326  Lumen #2 Status Flushed;Blood return noted;Saline locked 01/30/20 1326  Dressing Type Transparent 01/30/20 1326  Dressing Status Clean;Dry;Intact;Antimicrobial disc in place 01/30/20 1326  Dressing Change Due 02/06/20 01/30/20 1326   Pt signed consent with interpreter [phone]    Scotty Court 01/30/2020, 1:31 PM

## 2020-01-30 NOTE — Progress Notes (Addendum)
HEMATOLOGY-ONCOLOGY PROGRESS NOTE  SUBJECTIVE: The patient was seen with the assistance of an ASL video interpreter.  The patient reports that she feels somewhat better today.  Still having some discomfort with swallowing.  She also reports some right-sided back pain.  Still not really taking in anything by mouth.  Denies abdominal pain, nausea, vomiting.  Oncology History  Malignant neoplasm of lower-outer quadrant of left breast of female, estrogen receptor negative ([place])  05/11/2017 Initial Diagnosis   Left breast biopsy 3:30 position 6 cm from nipple: IDC with DCIS, lymphovascular invasion present, grade 2-3, ER 0%, PR 0%, HER-2 positive ratio 2.28, Ki-67 20%, 1 cm lesion left breast, T1b N0 stage IA clinical stage   06/09/2017 Surgery   Left lumpectomy: IDC grade 3, 1.2 cm, DCIS is present, margins negative, 0/6 lymph nodes negative, ER 0%, PR 0%, HER-2 positive ratio 2.28, Ki-67 20%, T1 BN 0 stage IA   07/09/2017 - 09/29/2017 Adjuvant Chemotherapy   Taxol/Herceptin x 12 completed on 09/29/2017, went on to receive every 3 week adjuvant Herceptin through 06/28/2018   11/25/2017 - 01/07/2018 Radiation Therapy    The patient initially received a dose of 50.4 Gy in 28 fractions to the breast using whole-breast tangent fields. This was delivered using a 3-D conformal technique. The patient then received a boost to the seroma. This delivered an additional 10 Gy in 5 fractions using a 3-D technique. The total dose was 60.4 Gy.   02/15/2018 Genetic Testing   The Common Hereditary Cancer Panel offered by [facility] includes sequencing and/or deletion duplication testing of the following 47 genes: APC, ATM, AXIN2, BARD1, BMPR1A, BRCA1, BRCA2, BRIP1, CDH1, CDKN2A (p14ARF), CDKN2A (p16INK4a), CKD4, CHEK2, CTNNA1, DICER1, EPCAM (Deletion/duplication testing only), GREM1 (promoter region deletion/duplication testing only), KIT, MEN1, MLH1, MSH2, MSH3, MSH6, MUTYH, NBN, NF1, NHTL1, PALB2, PDGFRA, PMS2, POLD1, POLE,  PTEN, RAD50, RAD51C, RAD51D, SDHB, SDHC, SDHD, SMAD4, SMARCA4. STK11, TP53, TSC1, TSC2, and VHL.  The following genes were evaluated for sequence changes only: SDHA and HOXB13 c.251G>A variant only.   Results: POSITIVE. 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 The date of this test report is 02/15/2018   10/20/2019 PET scan   Bone mets involving Right hip, Bil Iliac bones, T8, T9, L2 and L3, Rt prox humerus, Righ glenoid. Some lytic and some sclerotic. Mild Left supraclav adenopathy, 2 small lesions basal ganglia (could be lacunar infarcts)   11/14/2019 - 01/15/2020 Chemotherapy   The patient had palonosetron (ALOXI) injection 0.25 mg, 0.25 mg, Intravenous,  Once, 2 of 5 cycles Administration: 0.25 mg (12/05/2019), 0.25 mg (12/26/2019) ado-trastuzumab emtansine (KADCYLA) 260 mg in sodium chloride 0.9 % 250 mL chemo infusion, 3.6 mg/kg = 260 mg, Intravenous, Once, 3 of 6 cycles Administration: 260 mg (11/14/2019), 260 mg (12/05/2019), 260 mg (12/26/2019)  for chemotherapy treatment.    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 Some lytic and some sclerotic. Mild Left supraclav adenopathy, 2  small lesions basal ganglia (could be lacunar infarcts)   11/14/2019 - 01/15/2020 Chemotherapy   The patient had palonosetron (ALOXI) injection 0.25 mg, 0.25 mg, Intravenous,  Once, 2 of 5 cycles Administration: 0.25 mg  (12/05/2019), 0.25 mg (12/26/2019) ado-trastuzumab emtansine (KADCYLA) 260 mg in sodium chloride 0.9 % 250 mL chemo infusion, 3.6 mg/kg = 260 mg, Intravenous, Once, 3 of 6 cycles Administration: 260 mg (11/14/2019), 260 mg (12/05/2019), 260 mg (12/26/2019)  for chemotherapy treatment.       REVIEW OF SYSTEMS:   Constitutional: Denies fevers, chills  Respiratory: Denies cough, dyspnea or wheezes Cardiovascular: Denies palpitation, chest discomfort Gastrointestinal: Reports abdominal discomfort and difficulty swallowing.  No nausea or vomiting. Skin: Denies abnormal skin rashes Lymphatics: Denies new lymphadenopathy or easy bruising Neurological:Denies numbness, tingling or new weaknesses Behavioral/Psych: Mood is stable, no new changes  Extremities: No lower extremity edema All other systems were reviewed with the patient and are negative.  I have reviewed the past medical history, past surgical history, social history and family history with the patient and they are unchanged from previous note.   PHYSICAL EXAMINATION: ECOG PERFORMANCE STATUS: 2 - Symptomatic, <50% confined to bed  Vitals:   01/30/20 0003 01/30/20 0429  BP: 121/74 117/67  Pulse: 87 81  Resp: 16 16  Temp: 98.7 F (37.1 C) 98.3 F (36.8 C)  SpO2: 99% 98%   Filed Weights   01/30/20 0828  Weight: 61.4 kg    Intake/Output from previous day: No intake/output data recorded.  GENERAL:alert, no distress and comfortable SKIN: skin color, texture, turgor are normal, no rashes or significant lesions OROPHARYNX: White coating noted on tongue LUNGS: clear to auscultation and percussion with normal breathing effort HEART: regular rate & rhythm and no murmurs and no lower extremity edema ABDOMEN:abdomen soft, non-tender and normal bowel sounds Musculoskeletal:no cyanosis of digits and no clubbing  NEURO: alert & oriented x 3, no focal motor/sensory deficits  LABORATORY DATA:  I have reviewed the data as listed CMP  Latest Ref Rng & Units 01/30/2020 01/30/2020 01/29/2020  Glucose 70 - 99 mg/dL - 129(H) 83  BUN 6 - 20 mg/dL - <5(L) 5(L)  Creatinine 0.44 - 1.00 mg/dL - 0.45 0.40(L)  Sodium 135 - 145 mmol/L - 139 135  Potassium 3.5 - 5.1 mmol/L 4.0 3.3(L) 3.3(L)  Chloride 98 - 111 mmol/L - 106 104  CO2 22 - 32 mmol/L - 21(L) 19(L)  Calcium 8.9 - 10.3 mg/dL - 7.1(L) 6.8(L)  Total Protein 6.5 - 8.1 g/dL - 7.1 -  Total Bilirubin 0.3 - 1.2 mg/dL - 0.5 -  Alkaline Phos 38 - 126 U/L - 76 -  AST 15 - 41 U/L - 21 -  ALT 0 - 44 U/L - 14 -    Lab Results  Component Value Date   WBC 4.5 01/30/2020   HGB 9.3 (L) 01/30/2020   HCT 28.6 (L) 01/30/2020   MCV 90.8 01/30/2020   PLT 105 (L) 01/30/2020   NEUTROABS 3.1 01/30/2020    X-ray chest PA and lateral  Result Date: 01/29/2020 CLINICAL DATA:  Poor intake for 20 days with emesis EXAM: CHEST - 2 VIEW COMPARISON:  CT 01/01/2020 FINDINGS: No consolidation, features of edema, pneumothorax, or effusion. The cardiomediastinal contours are unremarkable. Right IJ approach Port-A-Cath tip terminates in the mid SVC. Sclerotic lesions are seen in the T8 and T9 vertebrae. Additional known osseous metastases disease to the spine is poorly visualized on radiography. Sclerotic and lucent changes of the right glenoid may  reflect additional site of metastatic disease. No other acute or worrisome osseous or soft tissue abnormality. IMPRESSION: 1. No acute cardiopulmonary abnormality. 2. Extensive osseous metastatic disease including the spine and right glenoid. Much of which is better visualized on comparison CT. Electronically Signed   By: Lovena Le M.D.   On: 01/29/2020 18:59   CT Chest W Contrast  Result Date: 01/02/2020 CLINICAL DATA:  Breast cancer restaging EXAM: CT CHEST, ABDOMEN, AND PELVIS WITH CONTRAST TECHNIQUE: Multidetector CT imaging of the chest, abdomen and pelvis was performed following the standard protocol during bolus administration of intravenous contrast.  CONTRAST:  158m OMNIPAQUE IOHEXOL 300 MG/ML  SOLN COMPARISON:  PET-CT 10/19/2018 FINDINGS: CT CHEST FINDINGS Cardiovascular: Heart size appears within normal limits. No pericardial effusion identified. Mild aortic atherosclerosis. Mediastinum/Nodes: Normal appearance of the thyroid gland. The trachea appears patent and is midline. Small hiatal hernia. Soft tissue infiltration within the left supraclavicular region is noted measuring 2.7 x 1.5 cm, image 6/2. This appears similar to the previous exam. No axillary adenopathy. No mediastinal or hilar adenopathy. Lungs/Pleura: No pleural effusion identified. No suspicious pulmonary nodule or mass. Musculoskeletal: Compared with 10/03/2019 there is been progression of mixed lytic and sclerotic metastases involving the T8 and T9 vertebra. There are new lytic lesions involving the superior endplate of T5 and T6, image 69/5. New lytic lesions are also identified within the posterior aspect of T11, T12, and L2. CT ABDOMEN PELVIS FINDINGS Hepatobiliary: Multiple scattered low-attenuation foci within the liver are new from previous study: -8 mm low-density structure within segment 7 is identified, image 47/2. -segment 5 low-density lesion measures 1.2 cm, image 63/2. -segment 3 low-density lesion measures 1.1 cm, image 52/2. Gallbladder normal. No wall thickening or inflammation. No bile duct dilatation. Pancreas: Unremarkable. No pancreatic ductal dilatation or surrounding inflammatory changes. Spleen: Normal in size without focal abnormality. Adrenals/Urinary Tract: Normal appearance of the adrenal glands. The kidneys are unremarkable. No kidney mass or hydronephrosis. The urinary bladder is negative. Stomach/Bowel: Small hiatal hernia. No bowel wall thickening, inflammation or distension. The appendix is visualized and appears normal. Vascular/Lymphatic: No significant vascular findings are present. No enlarged abdominal or pelvic lymph nodes. Reproductive: Status post  hysterectomy. No adnexal masses. Other: No ascites. No peritoneal nodule or mass. Supraumbilical, midline hernia contains fat only, image 67/2. Musculoskeletal: Interval progression of lytic bone metastases. Progressive lytic lesion involve L3 vertebra with new associated pathologic fracture. This measures 2.8 cm, image 67/5. This is compared with 2.0 cm on 10/20/2019. Tumor now extends into the left pedicle and left transverse process. Increased epidural tumor is noted with associated decreased AP diameter of the lumbar spine canal is noted which measures 8 mm on today's study. This is compared with 11 mm at the L2 level. Low new lytic changes are noted involving the L4 vertebra. Extension into the L4 pedicle is also noted. Lytic changes involving the left iliac wing are new, image 95/2. There is a new lytic lesion involving the greater trochanter of the right proximal femur which measures 3.2 cm on today's study. IMPRESSION: 1. Compared with PET-CT from 10/20/2019 there has been interval progression of lytic bone metastases involving the spine and bony pelvis as well as the proximal right femur. 2. New, multifocal low-density lesions in both lobes of liver concerning for liver metastases. 3. Similar appearance of soft tissue infiltration within the left supraclavicular region compared with 10/03/2019. 4. Decreased AP diameter at the L3 level secondary to epidural extension of tumor. Correlate for any clinical  signs or symptoms of spinal stenosis. Aortic Atherosclerosis (ICD10-I70.0). Electronically Signed   By: Kerby Moors M.D.   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 EXAM: MRI LUMBAR SPINE WITHOUT AND WITH CONTRAST TECHNIQUE: Multiplanar and multiecho pulse sequences of the lumbar spine were obtained without and  with intravenous contrast. CONTRAST:  58m GADAVIST GADOBUTROL 1 MMOL/ML IV SOLN COMPARISON:  CT Abdomen and Pelvis 01/01/2020, PET-CT 10/19/2018. FINDINGS: Segmentation:  Normal on the comparison CT. Alignment: Stable lumbar lordosis from the recent CT. No significant spondylolisthesis. Vertebrae: Extensive visible spinal metastases. T10 small spinous process metastasis. Small metastases in the T11 body and right pedicle. 14 mm metastasis right T12 vertebral body abutting the pedicle. And T12 spinous process metastasis. Subtle metastasis suspected in the left L1 transverse process. Large 24 mm metastasis replacing most of the right L2 vertebral body. Some extension into the right pedicle. No definite extraosseous extension. Tumor replacing most of the L3 vertebra including the entire body, left pedicle and left posterior elements. Pathologic compression fracture with 30% loss of vertebral body height. Broad-based extension of tumor into the ventral epidural space and also the left L3 neural foramen, with additional early extraosseous tumor along the left lateral vertebral margin. See series 16, image 16. Multifocal tumor replacing about half of the L4 vertebral body primarily to the left of midline. Left pedicle is involved. No definite extraosseous extension at this time. Small 15 mm posterosuperior L5 vertebral body metastasis. Partially visible right lateral sacral ala and medial right iliac bone metastases. There is also partially visible paracentral S3 level tumor on the right. Conus medullaris and cauda equina: Conus extends to the L1 level. No lower spinal cord or conus signal abnormality. No definite abnormal intradural enhancement or dural thickening; there is mild heterogeneity of the ventral cauda equina nerve roots at L1 on axial postcontrast, but this is not correlated on the sagittal. Paraspinal and other soft tissues: Visible abdominal and pelvic viscera are stable from the CT earlier this month.  There is mild edema in the left paraspinal muscles at L3-L4 likely related to the L3 extraosseous extension of tumor (series 6, image 15). Disc levels: Mild for age superimposed lumbar spine degeneration. Related to the epidural and extraosseous tumor at L3 there is moderate spinal stenosis, moderate lateral recess stenosis greater on the left, and severe left L3 foraminal stenosis. IMPRESSION: 1. Extensive metastatic disease to the visible spine. 2. Tumor replacement of the L3 vertebral body with pathologic compression fracture, epidural and extraosseous extension. Subsequent moderate spinal and lateral recess stenosis, and tumor throughout the left L3 foramen. 3. No other pathologic fracture, definite epidural or extraosseous disease. No definite intrathecal metastasis at this time. Electronically Signed   By: HGenevie AnnM.D.   On: 01/07/2020 07:41   MR HIP RIGHT W WO CONTRAST  Result Date: 01/08/2020 CLINICAL DATA:  Metastatic breast cancer. Right hip pain. EXAM: MRI OF THE RIGHT HIP WITHOUT AND WITH CONTRAST TECHNIQUE: Multiplanar, multisequence MR imaging was performed both before and after administration of intravenous contrast. CONTRAST:  788mGADAVIST GADOBUTROL 1 MMOL/ML IV SOLN COMPARISON:  CT scan of the abdomen and pelvis dated 01/01/2020 and PET-CT dated 10/20/2019 FINDINGS: Bones: There are numerous metastatic lesions throughout the bones of the pelvis, much more extensive in numerous than appreciable on the prior PET-CT or CT  scan listed above. There is a pathologic vertical linear fracture through the right ilium adjacent to the right SI joint without displacement. There is a 3.2 cm metastasis in the posterior aspect of the right greater trochanter with destruction of the cortex at the insertion of the obturator externus tendon. There is a small potassium cysts in the superior aspect of the right femoral head. There are multiple metastases in the L4 and L5 vertebral bodies as well as in the sacrum.  There is a metastasis in the left femoral neck. There is extensive metastatic disease involving the majority of the left acetabulum and to a lesser degree of the right acetabulum. Articular cartilage and labrum Articular cartilage:  Normal. Labrum:  Intact. Joint or bursal effusion Joint effusion:  No effusion. Bursae: No bursitis. Muscles and tendons Muscles and tendons: Edema around the operator tendons at the insertion on the posterior aspect of the right greater trochanter secondary to destructive metastasis at that site. There is edema in the adjacent soft tissues due to the metastasis. This involves the insertion of gluteus medius tendon. Other findings Miscellaneous:   None IMPRESSION: 1. Extensive metastatic disease throughout the bones of the pelvis, much more extensive than appreciable on the prior PET-CT or CT scan listed above. 2. Pathologic vertical fracture through the right ilium adjacent to the right SI joint. Destruction of the posterior cortex of the right greater trochanter. 3. Metastatic disease in the left femoral neck and sacrum. Electronically Signed   By: Lorriane Shire M.D.   On: 01/08/2020 08:18   CT Abdomen Pelvis W Contrast  Result Date: 01/02/2020 CLINICAL DATA:  Breast cancer restaging EXAM: CT CHEST, ABDOMEN, AND PELVIS WITH CONTRAST TECHNIQUE: Multidetector CT imaging of the chest, abdomen and pelvis was performed following the standard protocol during bolus administration of intravenous contrast. CONTRAST:  183m OMNIPAQUE IOHEXOL 300 MG/ML  SOLN COMPARISON:  PET-CT 10/19/2018 FINDINGS: CT CHEST FINDINGS Cardiovascular: Heart size appears within normal limits. No pericardial effusion identified. Mild aortic atherosclerosis. Mediastinum/Nodes: Normal appearance of the thyroid gland. The trachea appears patent and is midline. Small hiatal hernia. Soft tissue infiltration within the left supraclavicular region is noted measuring 2.7 x 1.5 cm, image 6/2. This appears similar to the  previous exam. No axillary adenopathy. No mediastinal or hilar adenopathy. Lungs/Pleura: No pleural effusion identified. No suspicious pulmonary nodule or mass. Musculoskeletal: Compared with 10/03/2019 there is been progression of mixed lytic and sclerotic metastases involving the T8 and T9 vertebra. There are new lytic lesions involving the superior endplate of T5 and T6, image 69/5. New lytic lesions are also identified within the posterior aspect of T11, T12, and L2. CT ABDOMEN PELVIS FINDINGS Hepatobiliary: Multiple scattered low-attenuation foci within the liver are new from previous study: -8 mm low-density structure within segment 7 is identified, image 47/2. -segment 5 low-density lesion measures 1.2 cm, image 63/2. -segment 3 low-density lesion measures 1.1 cm, image 52/2. Gallbladder normal. No wall thickening or inflammation. No bile duct dilatation. Pancreas: Unremarkable. No pancreatic ductal dilatation or surrounding inflammatory changes. Spleen: Normal in size without focal abnormality. Adrenals/Urinary Tract: Normal appearance of the adrenal glands. The kidneys are unremarkable. No kidney mass or hydronephrosis. The urinary bladder is negative. Stomach/Bowel: Small hiatal hernia. No bowel wall thickening, inflammation or distension. The appendix is visualized and appears normal. Vascular/Lymphatic: No significant vascular findings are present. No enlarged abdominal or pelvic lymph nodes. Reproductive: Status post hysterectomy. No adnexal masses. Other: No ascites. No peritoneal nodule or mass. Supraumbilical, midline hernia  contains fat only, image 67/2. Musculoskeletal: Interval progression of lytic bone metastases. Progressive lytic lesion involve L3 vertebra with new associated pathologic fracture. This measures 2.8 cm, image 67/5. This is compared with 2.0 cm on 10/20/2019. Tumor now extends into the left pedicle and left transverse process. Increased epidural tumor is noted with associated  decreased AP diameter of the lumbar spine canal is noted which measures 8 mm on today's study. This is compared with 11 mm at the L2 level. Low new lytic changes are noted involving the L4 vertebra. Extension into the L4 pedicle is also noted. Lytic changes involving the left iliac wing are new, image 95/2. There is a new lytic lesion involving the greater trochanter of the right proximal femur which measures 3.2 cm on today's study. IMPRESSION: 1. Compared with PET-CT from 10/20/2019 there has been interval progression of lytic bone metastases involving the spine and bony pelvis as well as the proximal right femur. 2. New, multifocal low-density lesions in both lobes of liver concerning for liver metastases. 3. Similar appearance of soft tissue infiltration within the left supraclavicular region compared with 10/03/2019. 4. Decreased AP diameter at the L3 level secondary to epidural extension of tumor. Correlate for any clinical signs or symptoms of spinal stenosis. Aortic Atherosclerosis (ICD10-I70.0). Electronically Signed   By: Kerby Moors M.D.   On: 01/02/2020 11:01   [place] EKG SITE RITE  Result Date: 01/30/2020 If Site Rite image not attached, placement could not be confirmed due to current cardiac rhythm.   ASSESSMENT AND PLAN: 1.  Metastatic breast cancer 2.  Severe dysphagia secondary to radiation therapy 3.  Esophageal candidiasis 4.  Hypokalemia, improved 5.  Type 2 diabetes mellitus  -The patient was scheduled to begin talazoparib on 01/29/2020.  However due to severe dysphagia, will delay the start of this medication. -Recommend for the patient to begin TPN. Orders have been placed. She is not interested in a PEG tube.  PICC line was placed earlier today. -Continue Viscous Lidocaine and as needed pain medication for odynophagia. -Fluconazole suspension 100 mg daily for 7 days was recommended by GI yesterday.  I will order this.   LOS: 1 day   Mikey Bussing, DNP, AGPCNP-BC,  AOCNP 01/30/20  Attending Note  I personally saw and examined [name]. The plan of care was discussed with her. I agree with the assessment and plan as documented above. Waiting for patient to start TPN ? Future PEG placement   Signed Harriette Ohara, MD

## 2020-01-30 NOTE — Progress Notes (Signed)
eLink Physician-Brief Progress Note Patient Name: Erin [name] DOB: Nov 29, 1960 MRN: [id number]   Date of Service  01/30/2020  HPI/Events of Note  K+ 3.3, Phos < 1.0  eICU Interventions  Elink electrolyte replacement protocol for K+ and Phos ordered.        Kerry Kass Elizet Kaplan 01/30/2020, 6:27 AM

## 2020-01-30 NOTE — Progress Notes (Signed)
PHARMACY - TOTAL PARENTERAL NUTRITION CONSULT NOTE   Indication: Severe dysphagia secondary to radiation therapy, declined PEG tube  Patient Measurements: Height: 4\' 11"  (149.9 cm) Weight: 61.4 kg (135 lb 6 oz) IBW/kg (Calculated) : 43.2 TPN AdjBW (KG): 47.8 Body mass index is 27.34 kg/m. Usual Weight: ~ 70 kg  Assessment: 59 y/o F with a h/o metastatic breast cancer, DM, and esophageal candidiasis admitted to initiate TPN therapy due to prolonged malnutrition from severe dysphagia related to radiation therapy. Patient declined PEG tube placement.   Glucose / Insulin: CBGs tidwm- will change to q4h tomorrow and start SSI upon initiation of TPN PTA: glimepiride and metformin/ no insulin Electrolytes: severe hypophosphatemia, replacing, K+ ok after replacement, Mg OK, Corr Ca 7.5 Renal: SCr < 1 LFTs / TGs: LFTs nl, TG pending Prealbumin / albumin: pending Intake / Output; MIVF: D5NS @ 75 cc/hr GI Imaging: Surgeries / Procedures:   Central access: 5/18 TPN start date:  5/19  Nutritional Goals kCal: 1600, Protein: 70g, Fluid: 1800 ml Goal TPN rate is 75 mL/hr (provides 70 g of protein and 1600 kcals per day)  TPN @ 75 ml/hr provides 1593 Kcal w/ 70 g protein, 30 g/L SMOF lipid, 12.5 % dextrose  Current Nutrition:  MIVF  Plan:  Replacing K and phos Calcium gluconate 1 g iv once F/U labs and replace as indicated Initiate SSI upon initiation of TPN (tidwm CBGs for now)   Ulice Dash D 01/30/2020,2:40 PM

## 2020-01-30 NOTE — Progress Notes (Signed)
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 She is only drinking alkaline water but otherwise has not been able to eat anything. Per her mother she does not like to drink any sugary juices, boost breeze, Gatorade, Pedialyte, Jell-O or even popsicles. States they are too sweet and makes her nauseous. She has not tried any broth or clear soups.  Her chest hurts when she tries to eat anything and sometimes even with saliva,she feels the chest hurts all the time. No significant improvement with Carafate tablets, she dissolves the tablets prior to drinking it as a slurry.  Oncologic history positive for breast cancer s/p lumpectomy, adjuvant chemotherapy and radiation therapy  Colonoscopy November 02, 2017: Removal of 2 small sessile polyps [tubular adenoma X1 and hyperplastic polypX1], internal hemorrhoids otherwise normal exam   Subjective: A/O x4, negative CP, negative S OB, negative abdominal pain.  Positive esophagus burning.  Information obtained through her mother who translated through sign language.   Assessment & Plan:   Active Problems:   Hearing loss   DM type 2 (diabetes mellitus, type 2) (HCC)   Fallopian tube cancer, carcinoma, right ([place])   Bone metastases ([place])   Dysphagia   Radiation-induced esophagitis   Esophageal candidiasis ([place])   Radiation induced esophagitis? -GI has evaluated patient  and it appears they plan to conduct  swallow study/esophagram with Gastrografin R/O large mucosal ulcerations or tears  Esophageal candidiasis -Per GI recommendation empirically treat fluconazole suspension 100 mg daily for 7 days -GI cocktail PRN pain -Prevacid Solutab 30 mg BID -Carafate suspension 1 g qac + qhs -Per patient not interested in a PEG tube for nutrition.  Protein calorie malnutrition  -D5-0.9% saline 72ml/hr -Appears that GI may have wanted patient to start TPN, will need to investigate further in the a.m. did they want to start TPN and cannot be run through Port-A-Cath. -5/18 GI request restart TPN on patient.  Have requested placement of PICC line or power glide as TPN cannot go through her port if oncology is going to use it in the future.  Hypokalemia -Potassium goal> 4 -Potassium IV 60 mEq  DM type II controlled without complication -99991111 hemoglobin A1c= 6.3 -A1c pending -Lipid panel pending -Sensitive SSI  Deaf -Stable -Mother acts as patient's interpreter of sign language   DVT prophylaxis: Lovenox Code Status: Full Family Communication: 5/18 mother at bedside counseled on plan of care answered all questions Disposition Plan: TBD Status is: Inpatient    Dispo: The patient is from: Home              Anticipated d/c is to: Home              Anticipated d/c date is: 27 May              Patient currently unstable      Consultants:  [facility]   Procedures/Significant Events:    I have personally reviewed and interpreted all radiology studies and my findings are as above.  VENTILATOR SETTINGS:    Cultures   Antimicrobials:    Devices    LINES / TUBES:      Continuous Infusions: . chlorproMAZINE (THORAZINE) IV    . dextrose 5 % and 0.9% NaCl 75 mL/hr at 01/30/20 0854  . methocarbamol (ROBAXIN) IV    . potassium PHOSPHATE IVPB (in mmol) 40 mmol (01/30/20 1010)     Objective: Vitals:   01/29/20 2133 01/30/20  0003 01/30/20 0429 01/30/20 0828  BP: 111/68 121/74 117/67   Pulse: 81 87 81   Resp: 17 16 16    Temp: 98.5 F (36.9 C) 98.7 F (37.1 C) 98.3 F (36.8 C)   TempSrc: Oral Oral Oral   SpO2: 98% 99% 98%   Weight:    61.4 kg  Height:    4\' 11"  (1.499 m)    Intake/Output Summary (Last 24 hours) at 01/30/2020 1140 Last data filed at 01/30/2020 [id number] Gross per 24 hour  Intake 799.5 ml  Output --  Net 799.5 ml   Filed Weights   01/30/20 0828  Weight: 61.4 kg    Examination:  General: A/O x4, no acute respiratory distress Eyes: negative scleral hemorrhage, negative anisocoria, negative icterus ENT: Negative Runny nose, negative gingival bleeding, Neck:  Negative scars, masses, torticollis, lymphadenopathy, JVD Lungs: Clear to auscultation bilaterally without wheezes or crackles Cardiovascular: Regular rate and rhythm without murmur gallop or rub normal S1 and S2 Abdomen: negative abdominal pain, nondistended, positive soft, bowel sounds, no rebound, no ascites, no appreciable mass Extremities: No significant cyanosis, clubbing, or edema bilateral lower extremities Skin: Negative rashes, lesions, ulcers Psychiatric:  Negative depression, negative anxiety, negative fatigue, negative mania  Central nervous system:  Cranial nerves II through XII intact, tongue/uvula midline, all extremities muscle strength 5/5, sensation intact throughout,  negative dysarthria, negative expressive aphasia, negative receptive aphasia.  .     Data Reviewed: Care during the described time interval was provided by me .  I have reviewed this patient's available data, including medical history, events of note, physical examination, and all test results as part of my evaluation.  CBC: Recent Labs  Lab 01/26/20 0825 01/29/20 1606 01/29/20 2122 01/30/20 0520  WBC 5.1 5.6 4.8 4.5  NEUTROABS 3.9  --   --  3.1  HGB 9.9* 10.9* 10.3* 9.3*  HCT 30.2* 33.8* 31.4* 28.6*  MCV 90.7 91.6 90.5 90.8  PLT 87* 120*  100* [id number]*   Basic Metabolic Panel: Recent Labs  Lab 01/26/20 0825 01/29/20 1606 01/29/20 2122 01/30/20 0520 01/30/20 0813  NA 138 137 135 139  --   K 3.0* 3.1* 3.3* 3.3* 4.0  CL 103 99 104 106  --   CO2 22 22 19* 21*  --   GLUCOSE 97 95 83 129*  --   BUN 5* 8 5* <5*  --   CREATININE 0.57 0.45 0.40* 0.45  --   CALCIUM 7.5* 7.7* 6.8* 7.1*  --   MG 1.8 2.2 1.8 2.0  --   PHOS 1.1*  --   --  <1.0*  --    GFR: Estimated Creatinine Clearance: 62.6 mL/min (by C-G formula based on SCr of 0.45 mg/dL). Liver Function Tests: Recent Labs  Lab 01/26/20 0825 01/29/20 1606 01/29/20 2122 01/30/20 0520  AST 23 23  --  21  ALT 12 16  --  14  ALKPHOS 89 85  --  76  BILITOT 0.3 0.9  --  0.5  PROT 7.4 8.1  --  7.1  ALBUMIN 2.8* 3.3*  2.7* 2.9*   Recent Labs  Lab 01/29/20 1606  LIPASE 18   No results for input(s): AMMONIA in the last 168 hours. Coagulation Profile: Recent Labs  Lab 01/30/20 0520  INR 1.1   Cardiac Enzymes: No results for input(s): CKTOTAL, CKMB, CKMBINDEX, TROPONINI in the last 168 hours. BNP (last 3 results) No results for input(s): PROBNP in the last 8760 hours. HbA1C: No results for input(s): HGBA1C in the last 72 hours. CBG: Recent Labs  Lab 01/30/20 0707  GLUCAP 123*   Lipid Profile: Recent Labs    01/30/20 0520  CHOL 95  HDL 18*  LDLCALC 57  TRIG 100  CHOLHDL 5.3   Thyroid Function Tests: No results for input(s): TSH, T4TOTAL, FREET4, T3FREE, THYROIDAB in the last 72 hours. Anemia Panel: No results for input(s): VITAMINB12, FOLATE, FERRITIN, TIBC, IRON, RETICCTPCT in the last 72 hours. Sepsis Labs: No results for input(s): PROCALCITON, LATICACIDVEN in the last 168 hours.  Recent Results (from the past 240 hour(s))  SARS Coronavirus 2 by RT PCR (hospital order, performed in [facility] hospital lab) Nasopharyngeal Nasopharyngeal Swab     Status: None   Collection Time: 01/29/20  5:26 PM   Specimen: Nasopharyngeal Swab  Result Value Ref  Range Status   SARS Coronavirus 2 NEGATIVE NEGATIVE Final    Comment: (NOTE) SARS-CoV-2 target nucleic acids are NOT DETECTED. The SARS-CoV-2 RNA is generally detectable in upper and lower respiratory specimens during the acute phase of infection. The lowest concentration of SARS-CoV-2 viral copies this assay can detect is 250 copies / mL. A negative result does not preclude SARS-CoV-2 infection and should not be used as the sole basis for treatment or other patient management decisions.  A negative result may occur with improper specimen collection / handling, submission of specimen other than nasopharyngeal swab, presence of viral mutation(s) within the areas targeted by this assay, and inadequate number of viral copies (<250 copies / mL). A negative result must be combined with clinical observations, patient history, and epidemiological information. Fact Sheet for Patients:   StrictlyIdeas.no Fact Sheet for Healthcare Providers: BankingDealers.co.za This test is not yet approved or cleared  by the [place] FDA and has been authorized for detection and/or diagnosis of SARS-CoV-2 by FDA under an Emergency Use Authorization (EUA).  This EUA will remain in effect (meaning this test can be used) for the duration of the COVID-19 declaration under Section 564(b)(1) of the Act, 21 U.S.C. section 360bbb-3(b)(1), unless the authorization is terminated or revoked sooner. Performed at [facility], [place] [address], [place],  [address]          Radiology Studies: X-ray chest PA and lateral  Result Date: 01/29/2020 CLINICAL DATA:  Poor intake for 20 days with emesis EXAM: CHEST - 2 VIEW COMPARISON:  CT 01/01/2020 FINDINGS: No consolidation, features of edema, pneumothorax, or effusion. The cardiomediastinal contours are unremarkable. Right IJ approach Port-A-Cath tip terminates in the mid SVC. Sclerotic lesions are  seen in the T8 and T9 vertebrae. Additional known osseous metastases disease to the spine is poorly visualized on radiography. Sclerotic and lucent changes of the right glenoid may reflect additional site of metastatic disease. No other acute or worrisome osseous or soft tissue abnormality. IMPRESSION: 1. No acute cardiopulmonary abnormality. 2. Extensive osseous metastatic disease including the spine and right glenoid. Much of which is better visualized on comparison CT. Electronically Signed   By: Lovena Le M.D.   On: 01/29/2020 18:59   [place] EKG SITE  RITE  Result Date: 01/30/2020 If Site Rite image not attached, placement could not be confirmed due to current cardiac rhythm.       Scheduled Meds: . enoxaparin (LOVENOX) injection  40 mg Subcutaneous Q24H  . sodium chloride flush  3 mL Intravenous Q12H   Continuous Infusions: . chlorproMAZINE (THORAZINE) IV    . dextrose 5 % and 0.9% NaCl 75 mL/hr at 01/30/20 0854  . methocarbamol (ROBAXIN) IV    . potassium PHOSPHATE IVPB (in mmol) 40 mmol (01/30/20 1010)     LOS: 1 day    Time spent:40 min    Gricelda Foland, Geraldo Docker, MD [facility] Pager [phone]  If 7PM-7AM, please contact night-coverage [email or web address] Password [email or web address] 01/30/2020, 11:40 AM

## 2020-01-31 DIAGNOSIS — R1310 Dysphagia, unspecified: Secondary | ICD-10-CM

## 2020-01-31 DIAGNOSIS — R1110 Vomiting, unspecified: Secondary | ICD-10-CM

## 2020-01-31 DIAGNOSIS — C50512 Malignant neoplasm of lower-outer quadrant of left female breast: Secondary | ICD-10-CM

## 2020-01-31 DIAGNOSIS — R1319 Other dysphagia: Secondary | ICD-10-CM

## 2020-01-31 DIAGNOSIS — B3781 Candidal esophagitis: Principal | ICD-10-CM

## 2020-01-31 DIAGNOSIS — Z171 Estrogen receptor negative status [ER-]: Secondary | ICD-10-CM

## 2020-01-31 LAB — CBC WITH DIFFERENTIAL/PLATELET
Abs Immature Granulocytes: 0.05 10*3/uL (ref 0.00–0.07)
Basophils Absolute: 0 10*3/uL (ref 0.0–0.1)
Basophils Relative: 0 %
Eosinophils Absolute: 0 10*3/uL (ref 0.0–0.5)
Eosinophils Relative: 1 %
HCT: 27.6 % — ABNORMAL LOW (ref 36.0–46.0)
Hemoglobin: 9.1 g/dL — ABNORMAL LOW (ref 12.0–15.0)
Immature Granulocytes: 1 %
Lymphocytes Relative: 12 %
Lymphs Abs: 0.5 10*3/uL — ABNORMAL LOW (ref 0.7–4.0)
MCH: 30.1 pg (ref 26.0–34.0)
MCHC: 33 g/dL (ref 30.0–36.0)
MCV: 91.4 fL (ref 80.0–100.0)
Monocytes Absolute: 0.7 10*3/uL (ref 0.1–1.0)
Monocytes Relative: 15 %
Neutro Abs: 3.1 10*3/uL (ref 1.7–7.7)
Neutrophils Relative %: 71 %
Platelets: 97 10*3/uL — ABNORMAL LOW (ref 150–400)
RBC: 3.02 MIL/uL — ABNORMAL LOW (ref 3.87–5.11)
RDW: 17.8 % — ABNORMAL HIGH (ref 11.5–15.5)
WBC: 4.3 10*3/uL (ref 4.0–10.5)
nRBC: 0 % (ref 0.0–0.2)

## 2020-01-31 LAB — TRIGLYCERIDES: Triglycerides: 93 mg/dL (ref <150)

## 2020-01-31 LAB — GLUCOSE, CAPILLARY
Glucose-Capillary: 140 mg/dL — ABNORMAL HIGH (ref 70–99)
Glucose-Capillary: 145 mg/dL — ABNORMAL HIGH (ref 70–99)
Glucose-Capillary: 138 mg/dL — ABNORMAL HIGH (ref 70–99)
Glucose-Capillary: 141 mg/dL — ABNORMAL HIGH (ref 70–99)

## 2020-01-31 LAB — COMPREHENSIVE METABOLIC PANEL
ALT: 14 U/L (ref 0–44)
AST: 19 U/L (ref 15–41)
Albumin: 2.6 g/dL — ABNORMAL LOW (ref 3.5–5.0)
Alkaline Phosphatase: 76 U/L (ref 38–126)
Anion gap: 7 (ref 5–15)
BUN: <5 mg/dL — ABNORMAL LOW (ref 6–20)
CO2: 25 mmol/L (ref 22–32)
Calcium: 6.6 mg/dL — ABNORMAL LOW (ref 8.9–10.3)
Chloride: 103 mmol/L (ref 98–111)
Creatinine, Ser: 0.33 mg/dL — ABNORMAL LOW (ref 0.44–1.00)
GFR calc Af Amer: >60 mL/min (ref >60)
GFR calc non Af Amer: >60 mL/min (ref >60)
Glucose, Bld: 162 mg/dL — ABNORMAL HIGH (ref 70–99)
Potassium: 3.6 mmol/L (ref 3.5–5.1)
Sodium: 135 mmol/L (ref 135–145)
Total Bilirubin: 0.4 mg/dL (ref 0.3–1.2)
Total Protein: 6.5 g/dL (ref 6.5–8.1)

## 2020-01-31 LAB — PREALBUMIN: Prealbumin: 8.1 mg/dL — ABNORMAL LOW (ref 18–38)

## 2020-01-31 LAB — LIPID PANEL
Cholesterol: 90 mg/dL (ref 0–200)
HDL: 18 mg/dL — ABNORMAL LOW (ref >40)
LDL Cholesterol: 53 mg/dL (ref 0–99)
Total CHOL/HDL Ratio: 5 RATIO
Triglycerides: 93 mg/dL (ref <150)
VLDL: 19 mg/dL (ref 0–40)

## 2020-01-31 LAB — PHOSPHORUS: Phosphorus: 2.3 mg/dL — ABNORMAL LOW (ref 2.5–4.6)

## 2020-01-31 LAB — MAGNESIUM: Magnesium: 1.6 mg/dL — ABNORMAL LOW (ref 1.7–2.4)

## 2020-01-31 LAB — HEMOGLOBIN A1C
Hgb A1c MFr Bld: 5.8 % — ABNORMAL HIGH (ref 4.8–5.6)
Mean Plasma Glucose: 120 mg/dL

## 2020-01-31 MED ORDER — PROMETHAZINE HCL 25 MG/ML IJ SOLN: 12.5000 mg | Freq: Four times a day (QID) | INTRAMUSCULAR | Status: DC | PRN

## 2020-01-31 MED ORDER — INSULIN ASPART 100 UNIT/ML ~~LOC~~ SOLN
0.0000 [IU] | SUBCUTANEOUS | Status: DC
  Administered 2020-01-31 – 2020-02-01 (×4): 1 [IU] via SUBCUTANEOUS
  Administered 2020-02-01: 2 [IU] via SUBCUTANEOUS
  Administered 2020-02-02 (×2): 1 [IU] via SUBCUTANEOUS

## 2020-01-31 MED ORDER — MAGIC MOUTHWASH
10.0000 mL | Freq: Three times a day (TID) | ORAL | Status: DC
  Administered 2020-01-31 – 2020-02-03 (×8): 10 mL via ORAL
  Filled 2020-01-31 (×11): qty 10

## 2020-01-31 MED ORDER — MAGNESIUM SULFATE 2 GM/50ML IV SOLN
2.0000 g | Freq: Once | INTRAVENOUS | Status: AC
  Administered 2020-01-31: 2 g via INTRAVENOUS
  Filled 2020-01-31: qty 50

## 2020-01-31 MED ORDER — PANTOPRAZOLE SODIUM 40 MG IV SOLR
40.0000 mg | Freq: Two times a day (BID) | INTRAVENOUS | Status: DC
  Administered 2020-01-31 – 2020-02-01 (×3): 40 mg via INTRAVENOUS
  Filled 2020-01-31 (×4): qty 40

## 2020-01-31 MED ORDER — TRAVASOL 10 % IV SOLN
INTRAVENOUS | Status: AC
  Filled 2020-01-31: qty 288

## 2020-01-31 MED ORDER — DEXTROSE-NACL 5-0.9 % IV SOLN: INTRAVENOUS | Status: AC

## 2020-01-31 MED ORDER — POTASSIUM PHOSPHATES 15 MMOLE/5ML IV SOLN
10.0000 mmol | Freq: Once | INTRAVENOUS | Status: AC
  Administered 2020-01-31: 10 mmol via INTRAVENOUS
  Filled 2020-01-31: qty 3.33

## 2020-01-31 MED ORDER — CALCIUM GLUCONATE-NACL 1-0.675 GM/50ML-% IV SOLN
1.0000 g | Freq: Once | INTRAVENOUS | Status: AC
  Administered 2020-01-31: 1000 mg via INTRAVENOUS
  Filled 2020-01-31: qty 50

## 2020-01-31 MED ORDER — FLUCONAZOLE 100MG IVPB
100.0000 mg | Freq: Every day | INTRAVENOUS | Status: DC
  Administered 2020-01-31: 100 mg via INTRAVENOUS
  Filled 2020-01-31: qty 50

## 2020-01-31 NOTE — TOC Initial Note (Signed)
Transition of Care [facility]) - Initial/Assessment Note    Patient Details  Name: Erin [name] MRN: [id number] Date of Birth: 1961-04-14  Transition of Care [facility]) CM/SW Contact:    Lynnell Catalan, RN Phone Number: 01/31/2020, 2:58 PM  Clinical Narrative:                 This CM was contacted by [employer] liaison Pam about a referral she received from Dr. Geralyn Flash office for home TPN for pt. Pam to continue to follow and provide home TPN for pt at DC. This CM offered choice for [facility] to mother (daughter sedated at the time). Mother chooses [facility]. [facility] liaison contacted for referral. TOC will continue to follow.  Expected Discharge Plan: [place] Barriers to Discharge: Continued Medical Work up   Patient Goals and CMS Choice     Choice offered to / list presented to : Parent  Expected Discharge Plan and Services Expected Discharge Plan: [place]   Discharge Planning Services: CM Consult Post Acute Care Choice: [place] arrangements for the past 2 months: Apartment                           HH Arranged: IV Antibiotics, RN HH Agency: [facility], [facility] Date [facility] Agency Contacted: 01/31/20   Representative spoke with at [place]: Floyde Parkins  Prior Living Arrangements/Services Living arrangements for the past 2 months: Apartment Lives with:: Self Patient language and need for interpreter reviewed:: Yes(Pt deaf, uses interpreter for ASL)        Need for Family Participation in Patient Care: Yes (Comment) Care giver support system in place?: Yes (comment)      Activities of Daily Living Home Assistive Devices/Equipment: Eyeglasses ADL Screening (condition at time of admission) Patient's cognitive ability adequate to safely complete daily activities?: Yes Is the patient deaf or have difficulty hearing?: Yes Does the patient have difficulty seeing, even when wearing glasses/contacts?: No Does the patient  have difficulty concentrating, remembering, or making decisions?: No Patient able to express need for assistance with ADLs?: Yes(uses sign language) Does the patient have difficulty dressing or bathing?: No Independently performs ADLs?: Yes (appropriate for developmental age) Does the patient have difficulty walking or climbing stairs?: No Weakness of Legs: Both Weakness of Arms/Hands: Both  Permission Sought/Granted Permission sought to share information with : Facility Art therapist granted to share information with : Yes, Verbal Permission Granted     Permission granted to share info w AGENCY: [facility] and [facility]        Emotional Assessment Appearance:: Appears stated age            Admission diagnosis:  Dysphagia [R13.10] Patient Active Problem List   Diagnosis Date Noted  . Dysphagia 01/29/2020  . 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  . Esophageal candidiasis ([place]) 01/29/2020  . Bone metastases ([place]) 12/28/2019  . Pain from bone metastases ([place]) 12/28/2019  . Goals of care, counseling/discussion 11/07/2019  . 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 Elevated CA-125 10/09/2019  . Paresthesia 04/17/2019  . BRCA2 gene mutation positive 02/17/2018  . Genetic testing 02/17/2018  . Family history of breast cancer   . Family history of colon cancer   . Fallopian tube cancer, carcinoma, right ([place]) 11/22/2017  . S/P  vaginal hysterectomy 11/04/2017  . Ovarian mass, left 08/26/2017  . Menorrhagia 08/26/2017  . Port-A-Cath in place 07/23/2017  . Malignant neoplasm of lower-outer quadrant of left breast of female, estrogen receptor negative ([place]) 05/13/2017  . Abdominal bloating 04/02/2017  . Chronic right-sided low back pain without sciatica 07/02/2016  . DM type 2 (diabetes  mellitus, type 2) ([place]) 11/18/2014  . Intermittent constipation 06/06/2010  . UNSPECIFIED IRON DEFICIENCY ANEMIA 04/22/2010  . DEPRESSION 04/10/2010  . Hearing loss 04/10/2010   PCP:  Antony Blackbird, MD Pharmacy:   [id number]Phone: [phone]: [phone]    Social Determinants of Health (SDOH) Interventions    Readmission Risk Interventions Readmission Risk Prevention Plan 01/31/2020  Transportation Screening Complete  PCP or Specialist Appt within 3-5 Days Complete  [place] or [place]  Social Work Consult for [place]/Counseling Complete  Palliative Care Screening Not Applicable  Medication Review (Press photographer Complete  Some recent data might be hidden

## 2020-01-31 NOTE — Progress Notes (Signed)
Pharmacist Chemotherapy Monitoring - Follow Up Assessment    I verify that I have reviewed each item in the below checklist:  . Regimen for the patient is scheduled for the appropriate day and plan matches scheduled date. Marland Kitchen Appropriate non-routine labs are ordered dependent on drug ordered. . If applicable, additional medications reviewed and ordered per protocol based on lifetime cumulative doses and/or treatment regimen.   Plan for follow-up and/or issues identified: Yes . I-vent associated with next due treatment: Yes . MD and/or nursing notified: Yes  Erin [name] 01/31/2020 4:10 PM ]

## 2020-01-31 NOTE — Progress Notes (Signed)
PHARMACY - TOTAL PARENTERAL NUTRITION CONSULT NOTE   Indication: Severe dysphagia secondary to radiation therapy, declined PEG tube  Patient Measurements: Height: 4\' 11"  (149.9 cm) Weight: 61.4 kg (135 lb 6 oz) IBW/kg (Calculated) : 43.2 TPN AdjBW (KG): 47.8 Body mass index is 27.34 kg/m. Usual Weight: ~ 70 kg  Assessment: 59 y/o F with a h/o metastatic breast cancer, DM, and esophageal candidiasis admitted to initiate TPN therapy due to prolonged malnutrition from severe dysphagia related to radiation therapy. Patient declined PEG tube placement.   Glucose / Insulin: CBGs 140-172, no coverage prior to TPN, Pt NPO w/ MIVF running PTA: glimepiride and metformin/ no insulin Electrolytes: phos improved, K wnl, mag low, Corr Ca 7.7 after calcium gluconate yesterday Renal: SCr < 1, stable LFTs / TGs: LFTs nl, TG pending Prealbumin / albumin: 8.1 (5/19)/2.6 (5/19) Intake / Output; MIVF: D5NS @ 75 cc/hr GI Imaging: Surgeries / Procedures:   Central access: 5/18 TPN start date:  5/19  Nutritional Goals kCal: 1600, Protein: 70g, Fluid: 1800 ml Goal TPN rate is 75 mL/hr (provides 70 g of protein and 1600 kcals per day)  TPN @ 75 ml/hr provides 1593 Kcal w/ 72 g protein, 30 g/L SMOF lipid, 12.5 % dextrose  Current Nutrition:  MIVF  Plan:  Now:  - mag sulfate 2 g iv once - calcium gluconate 1 g iv once - kphos 10 mmol iv once  At 1800: Start TPN at 30 mL/hr at 1800 Electrolytes in TPN: 12mEq/L of Na, 41mEq/L of K, 37mEq/L of Ca, 82mEq/L of Mg, and 17mmol/L of Phos. Cl:Ac ratio 1:1 Add standard MVI and trace elements to TPN Initiate Sensitive q4h SSI and adjust as needed  Reduce MIVF to 45 mL/hr at 1800 Monitor TPN labs on Mon/Thurs Advance TPN to goal as tolerated  Ulice Dash D 01/31/2020,7:53 AM

## 2020-01-31 NOTE — Progress Notes (Addendum)
PROGRESS NOTE    Erin [name]  [id number] DOB: May 09, 1961 DOA: 01/29/2020 PCP: Antony Blackbird, MD   Chief Complaint  Patient presents with  . Abdominal Pain  . 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 She is only drinking alkaline water but otherwise has not been able to eat anything. Per her mother she does not like to drink any sugary juices, boost breeze, Gatorade, Pedialyte, Jell-O or even popsicles. States they are too sweet and makes her nauseous. She has not tried any broth or clear soups.  Her chest hurts when she tries to eat anything and sometimes even with saliva,she feels the chest hurts all the time. No significant improvement with Carafate tablets, she dissolves the tablets prior to drinking it as Erin [name] slurry.  Oncologic history positive for breast cancer s/p lumpectomy, adjuvant chemotherapy and radiation therapy  Colonoscopy November 02, 2017: Removal of 2 small sessile polyps [tubular adenoma X1 and hyperplastic polypX1], internal hemorrhoids otherwise normal exam   Assessment & Plan:   Active Problems:   Hearing loss   DM type 2 (diabetes mellitus, type 2) (HCC)   Fallopian tube cancer, carcinoma, right ([place])   Bone metastases ([place])   Dysphagia   Radiation-induced esophagitis   Esophageal candidiasis ([place])  Radiation induced esophagitis? -GI saw pt earlier this week -> ? Plan for swallow study/esophagram - will discuss with GI   Esophageal candidiasis -Per GI recommendation  empirically treatfluconazole suspension 100 mg daily for 7 days -> not able to tolerate PO, will transition to IV -GI cocktailPRNpain -IV PPI bid -Carafate suspension 1 gqac + qhs -Per patient not interested in Erin [name] PEG tube for nutrition   Protein calorie malnutrition  -D5-0.9% saline 32ml/hr -Appears that GI may have wanted patient to start TPN, will need to investigate further in the Erin [name].m. did they want to start TPN and cannot be run through [facility]. -5/18 GI request restart TPN on patient.  - TPN to start tonight  Electrolyte Abnormalities  Hypokalemia  Hypomagnesemia  Hypophosphatemia - replace and follow (per pharmacy) - follow electrolytes daily  DM type II controlled without complication -0000000 -A1c 5.8 -Lipid panel -> HDL 18 -Sensitive SSI  Deaf -Stable  DVT prophylaxis: lovenox Code Status: full  Family Communication: mother at bedside Disposition:   Status is: Inpatient  Remains inpatient appropriate because:Inpatient level of care appropriate due to severity of illness   Dispo: The patient is from: Home              Anticipated d/c is to: pending              Anticipated d/c date is: > 3 days              Patient currently is not medically stable to d/c.    Consultants:   oncology  Procedures:  PICC placement 5/18  Antimicrobials:  Anti-infectives (From admission, onward)   Start     Dose/Rate Route Frequency Ordered Stop   01/31/20 1700  fluconazole (DIFLUCAN) IVPB 100 mg     100 mg 50 mL/hr over 60 Minutes Intravenous Daily-1600 01/31/20 1534 02/07/20 1559   01/30/20 1530  fluconazole (DIFLUCAN) 40 MG/ML suspension 100 mg  Status:  Discontinued     100 mg Oral Daily 01/30/20 1354 01/31/20 1534      Subjective: Sleepy after nausea medicine Not able to tolerate anything   Objective: Vitals:   01/30/20 1404 01/30/20 2230 01/31/20 0551 01/31/20 1415  BP: 130/68 (!) 162/64 126/72 132/69  Pulse: 78 90 83 81   Resp: 15 16 16 18   Temp: 98.7 F (37.1 C) 98.5 F (36.9 C) 98.4 F (36.9 C) 98.6 F (37 C)  TempSrc: Oral Oral Oral Oral  SpO2: 100% 96% 99% 98%  Weight:      Height:       No intake or output data in the 24 hours ending 01/31/20 1536 Filed Weights   01/30/20 0828  Weight: 61.4 kg    Examination:  General exam: Appears calm and comfortable  Respiratory system: Clear to auscultation. Respiratory effort normal. Cardiovascular system: S1 & S2 heard, RRR.  Gastrointestinal system: Abdomen is nondistended, soft and nontender. Central nervous system: deaf, sleepy after nausea treatment with thorazine Extremities: Symmetric 5 x 5 power. Skin: No rashes, lesions or ulcers Psychiatry: Judgement and insight appear normal. Mood & affect appropriate.     Data Reviewed: I have personally reviewed following labs and imaging studies  CBC: Recent Labs  Lab 01/26/20 0825 01/29/20 1606 01/29/20 2122 01/30/20 0520 01/31/20 0614  WBC 5.1 5.6 4.8 4.5 4.3  NEUTROABS 3.9  --   --  3.1 3.1  HGB 9.9* 10.9* 10.3* 9.3* 9.1*  HCT 30.2* 33.8* 31.4* 28.6* 27.6*  MCV 90.7 91.6 90.5 90.8 91.4  PLT 87* 120* 100* 105* 97*    Basic Metabolic Panel: Recent Labs  Lab 01/26/20 0825 01/26/20 0825 01/29/20 1606 01/29/20 1606 01/29/20 2122 01/30/20 0520 01/30/20 0813 01/30/20 1740 01/31/20 0614  NA 138  --  137  --  135 139  --   --  135  K 3.0*   < > 3.1*   < > 3.3* 3.3* 4.0 3.2* 3.6  CL 103  --  99  --  104 106  --   --  103  CO2 22  --  22  --  19* 21*  --   --  25  GLUCOSE 97  --  95  --  83 129*  --   --  162*  BUN 5*  --  8  --  5* <5*  --   --  <5*  CREATININE 0.57  --  0.45  --  0.40* 0.45  --   --  0.33*  CALCIUM 7.5*  --  7.7*  --  6.8* 7.1*  --   --  6.6*  MG 1.8   < > 2.2  --  1.8 2.0  --  1.7 1.6*  PHOS 1.1*  --   --   --   --  <1.0*  --  1.4* 2.3*   < > = values in this interval not displayed.    GFR: Estimated Creatinine Clearance: 62.6 mL/min (Erin [name]) (by C-G formula  based on SCr of 0.33 mg/dL (L)).  Liver Function Tests: Recent Labs  Lab 01/26/20 0825 01/29/20 1606 01/29/20 2122 01/30/20 0520 01/31/20 0614  AST 23 23  --  21 19  ALT 12 16  --  14 14  ALKPHOS 89 85  --  76 76  BILITOT 0.3 0.9  --  0.5 0.4  PROT 7.4 8.1  --  7.1 6.5  ALBUMIN 2.8* 3.3* 2.7*  2.9* 2.6*    CBG: Recent Labs  Lab 01/30/20 1215 01/30/20 1700 01/30/20 2227 01/31/20 0711 01/31/20 1155  GLUCAP 172* 155* 156* 140* 145*     Recent Results (from the past 240 hour(s))  SARS Coronavirus 2 by RT PCR (hospital order, performed in [facility] hospital lab) Nasopharyngeal Nasopharyngeal Swab     Status: None   Collection Time: 01/29/20  5:26 PM   Specimen: Nasopharyngeal Swab  Result Value Ref Range Status   SARS Coronavirus 2 NEGATIVE NEGATIVE Final    Comment: (NOTE) SARS-CoV-2 target nucleic acids are NOT DETECTED. The SARS-CoV-2 RNA is generally detectable in upper and lower respiratory specimens during the acute phase of infection. The lowest concentration of SARS-CoV-2 viral copies this assay can detect is 250 copies / mL. Erin [name] negative result does not preclude SARS-CoV-2 infection and should not be used as the sole basis for treatment or other patient management decisions.  Erin [name] negative result may occur with improper specimen collection / handling, submission of specimen other than nasopharyngeal swab, presence of viral mutation(s) within the areas targeted by this assay, and inadequate number of viral copies (<250 copies / mL). Erin [name] negative result must be combined with clinical observations, patient history, and epidemiological information. Fact Sheet for Patients:   StrictlyIdeas.no Fact Sheet for Healthcare Providers: BankingDealers.co.za This test is not yet approved or cleared  by the [place] FDA and has been authorized for detection and/or diagnosis of SARS-CoV-2 by FDA under an Emergency Use  Authorization (EUA).  This EUA will remain in effect (meaning this test can be used) for the duration of the COVID-19 declaration under Section 564(b)(1) of the Act, 21 U.S.C. section 360bbb-3(b)(1), unless the authorization is terminated or revoked sooner. Performed at [facility], [place] [address] [place], [place] [address]          Radiology Studies: X-ray chest PA and lateral  Result Date: 01/29/2020 CLINICAL DATA:  Poor intake for 20 days with emesis EXAM: CHEST - 2 VIEW COMPARISON:  CT 01/01/2020 FINDINGS: No consolidation, features of edema, pneumothorax, or effusion. The cardiomediastinal contours are unremarkable. Right IJ approach [place] [facility]-[facility] tip terminates in the mid SVC. Sclerotic lesions are seen in the T8 and T9 vertebrae. Additional known osseous metastases disease to the spine is poorly visualized on radiography. Sclerotic and lucent changes of the right glenoid may reflect additional site of metastatic disease. No other acute or worrisome osseous or soft tissue abnormality. IMPRESSION: 1. No acute cardiopulmonary abnormality. 2. Extensive osseous metastatic disease including the spine and right glenoid. Much of which is better visualized on comparison CT. Electronically Signed   By: Lovena Le M.D.   On: 01/29/2020 18:59   [place] EKG SITE RITE  Result Date: 01/30/2020 If Site Rite image not attached, placement could not be confirmed due to current cardiac rhythm.       Scheduled Meds: . Chlorhexidine Gluconate Cloth  6 each Topical Daily  . Chlorhexidine Gluconate Cloth  6 each Topical Daily  . enoxaparin (LOVENOX) injection  40 mg Subcutaneous Q24H  . insulin aspart  0-9 Units Subcutaneous Q4H   Continuous Infusions: . chlorproMAZINE (THORAZINE) IV 12.5 mg (01/31/20 0859)  . dextrose 5 % and 0.9% NaCl 75 mL/hr at 01/30/20 0854  . dextrose 5 % and 0.9% NaCl    . fluconazole (DIFLUCAN) IV    . methocarbamol (ROBAXIN) IV    . potassium PHOSPHATE  IVPB (in mmol) 10 mmol (01/31/20 1120)  . TPN ADULT (ION)  LOS: 2 days    Time spent: over 30 min    Fayrene Helper, MD [facility]   To contact the attending provider between 7A-7P or the covering provider during after hours 7P-7A, please log into the web site [email or web address] and access using universal [place] password for that web site. If you do not have the password, please call the hospital operator.  01/31/2020, 3:36 PM

## 2020-01-31 NOTE — Consult Note (Addendum)
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 Mainly complains of frequent and random vomiting leading to severe burning in chest --Most likely cause being either radiation-induced esophagitis or Candida esophagitis --She is scheduled for outpatient esophagram 524, will order that to be done as inpatient.  --Depending on esophagram results and/or clinical course she may need EGD --In the interim continue IV Diflucan, IV PPI, IV Zofran, IV hydration --Will add Magic mouthwash --She needs TNA. I will consult Pharmacy  # Deafness  # Breast cancer with bony mets --Recent palliative radiation to spinal cord   HPI:    Chief Complaint: swallowing problems.   Interview performed with assistance of ASL video interpreter.   Erin [name] is a 59 y.o. female with metastatic breast cancer undergoing treatment Talazopariband Xgeva.  She recently underwent palliative radiation to her spinal cord ( late April through first week in May). She has been suffering with nausea and vomiting and poor appetite for several days now. The vomiting is unrelated to eating. Following episodes of vomiting she gets a burning discomfort in her chest. She denies pain with swallowing or dysphagia. Oncology has been giving her daily IV fluids for dehydration.  She has not had any improvement with antiemetics, viscous lidocaine, Mylanta nor Carafate as they all make her vomiting. She tried SL and rectal anti-emetics without relief.  Apparently  transdermal antimedics make her dizzy . There have been discussions about initiating home TNA .   She was evaluated in our office for the symptoms on 01/29/20. We were concerned about severe radiation induced esophagitis vrs candida esophagitis. Patient was scheduled for an esophagram with gastrografin. We started fluconazole suspension empirically for candida esophagitis. She was prescribed Prevacid solutab BID and Carafate suspension ac and hs. Esophagram was scheduled for 02/05/20.  In the interim patient was admitted after presenting to ED yesterday with vomiting. She has occasional discomfort / pressure under right breast but this has been getting better.    Previous Endoscopic Evaluation:   Screening colonoscopy 11/03/19 Complete exam /excellent prep The perianal and digital rectal examinations were normal. - Two sessile polyps were found in the sigmoid colon and transverse colon. The polyps were 5 to 7 mm in size. These polyps were removed with a cold snare. Resection and retrieval were complete. - Non-bleeding internal hemorrhoids were found during retroflexion. The hemorrhoids were small. - The exam was otherwise without abnormality. Past Medical History:  Diagnosis Date  . Arthritis    knees, elbows  . Borderline glaucoma of right eye   . BRCA2 gene mutation positive 02/17/2018   BRCA2 [id number] (p.[id number]*25)  Result reported out on 02/15/2018.   . Breast cancer ([place]) 05/2017   left  . Chronic back pain   . Deaf    per pt born hearing and at age 73 1/2 lost hearing , was told by mother unknown cause, can miminally hear in left and  no hearing on right   . Depression   . Dyspnea    states some SOB with ADLs and anemia   . Elevated cancer antigen 125 (CA 125)   . Family history of breast cancer   . Family history of breast cancer   . Family history of colon cancer   . Genetic testing 02/17/2018   The Common Hereditary Cancer Panel offered by [facility] includes sequencing and/or  deletion duplication testing of the following 47 genes: APC, ATM, AXIN2, BARD1, BMPR1A, BRCA1, BRCA2, BRIP1, CDH1, CDKN2A (p14ARF), CDKN2A (p16INK4a), CKD4, CHEK2, CTNNA1, DICER1, EPCAM (Deletion/duplication testing only), GREM1 (promoter region deletion/duplication testing only), KIT, MEN1, MLH1, MSH2, MSH3, MSH6, MU  . History of cancer chemotherapy    left breast cancer 09-08-2017 to 10-09-2017;  fallopion tube cancer  12-15-2017  to 06-28-2018  . 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 History of external beam radiation therapy    left breast  11-25-2017  to 01-07-2018  . Hyperlipidemia   . Hypertension    followed by pcp   (10-05-2019  per pt never had stress test)  . IDA (iron deficiency anemia)   . Malignant neoplasm of lower-outer quadrant of left breast of female, estrogen receptor negative [facility]) oncologist-- dr [name]   dx 08/ 2018--- Stage IA, DCIS,  ER/ PR negative,  HER-2 positive;  06-09-2017 s/p left breast lumpectomy with node dissection;  completed chemo 10-09-2017  and radiation 01-07-2018/  hercepton completed 06-28-2018  . Non-insulin dependent type 2 diabetes mellitus ([place])    followed by pcp   (10-05-2019 per pt check cbg every other day in AM,  fasting cbg-- 105)  . Numbness of right thumb   . Wears glasses     Past Surgical History:  Procedure Laterality Date  . BREAST LUMPECTOMY WITH RADIOACTIVE SEED AND SENTINEL LYMPH NODE BIOPSY Left 06/09/2017   Procedure: BREAST LUMPECTOMY WITH RADIOACTIVE SEED AND SENTINEL LYMPH NODE BIOPSY;  Surgeon: Excell Seltzer, MD;  Location: [place];  Service: General;  Laterality: Left;  . COLONOSCOPY  11/02/2017   polyps  . HERNIA REPAIR    . HIATAL HERNIA REPAIR  09/2019  . LAPAROSCOPIC ASSISTED VAGINAL HYSTERECTOMY N/A 11/04/2017    Procedure: LAPAROSCOPIC ASSISTED VAGINAL HYSTERECTOMY;  Surgeon: [name], MD;  Location: [facility];  Service: Gynecology;  Laterality: N/A;  . LAPAROSCOPIC BILATERAL SALPINGO OOPHERECTOMY Bilateral 11/04/2017   Procedure: LAPAROSCOPIC BILATERAL SALPINGO OOPHORECTOMY;  Surgeon: [name], MD;  Location: [facility];  Service: Gynecology;  Laterality: Bilateral;  . LAPAROSCOPY N/A 10/09/2019   Procedure: LAPAROSCOPY DIAGNOSTIC WITH PERITONEAL BIOPSIES;  Surgeon: [name], MD;  Location[facility];  Service: Gynecology;  Laterality: N/A;  . PORTACATH PLACEMENT Right 06/09/2017   Procedure: INSERTION PORT-A-CATH WITH [place];  Surgeon: Excell Seltzer, MD;  Location: [place];  Service: General;  Laterality: Right;  . PORTACATH PLACEMENT Right 11/13/2019   Procedure: INSERTION PORT-A-CATH WITH ULTRASOUND GUIDANCE;  Surgeon: Rolm Bookbinder, MD;  Location: [place];  Service: General;  Laterality: Right;  . TUBAL LIGATION  02/02/2002   @[facility]    [facility]  . UPPER GI ENDOSCOPY      Prior to Admission medications   Medication Sig Start Date End Date Taking? Authorizing Provider  Accu-Chek FastClix Lancets MISC Use  as instructed. Inject into the skin twice daily 12/26/18  Yes [name], NP  atorvastatin (LIPITOR) 40 MG tablet Take 1 tablet (40 mg total) by mouth daily. 06/23/19  Yes Fulp, Cammie, MD  Blood Glucose Calibration (ACCU-CHEK GUIDE CONTROL) LIQD 1 each by In Vitro route once as needed for up to 1 dose. 12/26/18  Yes [name], NP  cholecalciferol (VITAMIN D) 1000 units tablet Take 1,000 Units by mouth daily.   Yes [provider]  docusate sodium (COLACE) 100 MG capsule Take 1 capsule (100 mg total) by mouth 2 (two) times daily. 12/26/19  Yes [name], MD  ferrous sulfate 325 (65 FE) MG tablet TAKE 1 TABLET (325 MG TOTAL) BY MOUTH DAILY BEFORE BREAKFAST. 12/19/19 03/18/20 Yes Fulp, Cammie, MD  glimepiride (AMARYL) 2 MG tablet  Take 1 tablet (2 mg total) by mouth daily with breakfast. To lower blood sugar 08/04/19  Yes Fulp, Cammie, MD  glucose blood (ACCU-CHEK GUIDE) test strip Use as instructed. Check blood glucose by fingerstick twice per day. 12/26/18  Yes [name], NP  Lancet Devices [facility]) lancets Use as instructed 12/17/14  Yes Lance Bosch, NP  lisinopril (ZESTRIL) 10 MG tablet Take 1 tablet (10 mg total) by mouth daily. To lower blood pressure 08/24/19  Yes Fulp, Cammie, MD  metFORMIN (GLUCOPHAGE-XR) 500 MG 24 hr tablet TAKE 2 TABLETS BY MOUTH EVERY DAY WITH BREAKFAST Patient taking differently: Take 1,000 mg by mouth daily with breakfast.  12/01/19  Yes Fulp, Cammie, MD  methocarbamol (ROBAXIN) 500 MG tablet Take 2 tablets (1,000 mg total) by mouth at bedtime as needed for muscle spasms. 12/05/19  Yes [name], MD  promethazine (PHENERGAN) 12.5 MG suppository Place 1 suppository (12.5 mg total) rectally every 6 (six) hours as needed for nausea or vomiting. 01/16/20  Yes Hayden Pedro, PA-C  fentaNYL (DURAGESIC) 25 MCG/HR Place 1 patch onto the skin every 3 (three) days. Patient not taking: Reported on 01/29/2020 01/08/20   [name], MD  fluconazole (DIFLUCAN) 10 MG/ML suspension Take 10 mLs (100 mg total) by mouth daily for 7 days. Patient not taking: Reported on 01/29/2020 01/29/20 02/05/20  Mauri Pole, MD  lansoprazole (PREVACID SOLUTAB) 30 MG disintegrating tablet Take 1 tablet (30 mg total) by mouth in the morning and at bedtime. Patient not taking: Reported on 01/29/2020 01/29/20   Mauri Pole, MD  lidocaine (XYLOCAINE) 2 % solution 1 teaspoon mixed with 30 ml of Mylanta q 3 hours prn GERD Patient not taking: Reported on 01/29/2020 01/19/20   Sandi Mealy E., PA-C  ondansetron (ZOFRAN ODT) 4 MG disintegrating tablet Take 1 tablet (4 mg total) by mouth every 8 (eight) hours as needed for nausea or vomiting. Patient not taking: Reported on 01/29/2020 01/29/20    Mauri Pole, MD  pantoprazole (PROTONIX) 20 MG tablet Take 1 tablet (20 mg total) by mouth daily. Patient not taking: Reported on 01/29/2020 01/08/20   [name], MD  sucralfate (CARAFATE) 1 GM/10ML suspension Take 1 gram before each meal and at bedtime 01/29/20   Mauri Pole, MD  talazoparib tosylate (TALZENNA) 1 MG capsule Take 1 capsule (1 mg total) by mouth daily. Patient not taking: Reported on 01/29/2020 01/08/20   [name], MD    Current Facility-Administered Medications  Medication Dose Route Frequency Provider Last Rate Last Admin  . acetaminophen (TYLENOL) tablet 650 mg  650 mg Oral Q6H PRN Allie Bossier, MD  Or  . acetaminophen (TYLENOL) suppository 650 mg  650 mg Rectal Q6H PRN Allie Bossier, MD      . alum & mag hydroxide-simeth (MAALOX/MYLANTA) 200-200-20 MG/5ML suspension 30 mL  30 mL Oral Q4H PRN Allie Bossier, MD       And  . lidocaine (XYLOCAINE) 2 % viscous mouth solution 15 mL  15 mL Oral Q4H PRN Allie Bossier, MD   15 mL at 01/30/20 1440  . Chlorhexidine Gluconate Cloth 2 % PADS 6 each  6 each Topical Daily Allie Bossier, MD   6 each at 01/30/20 2255  . Chlorhexidine Gluconate Cloth 2 % PADS 6 each  6 each Topical Daily Allie Bossier, MD   6 each at 01/31/20 1000  . dextrose 5 %-0.9 % sodium chloride infusion   Intravenous Continuous Elodia Florence., MD 75 mL/hr at 01/30/20 0854 New Bag at 01/30/20 0854  . dextrose 5 %-0.9 % sodium chloride infusion   Intravenous Continuous Elodia Florence., MD      . dicyclomine (BENTYL) 10 MG/5ML solution 10 mg  10 mg Oral QID PRN Allie Bossier, MD      . enoxaparin (LOVENOX) injection 40 mg  40 mg Subcutaneous Q24H Allie Bossier, MD   40 mg at 01/30/20 2203  . fluconazole (DIFLUCAN) IVPB 100 mg  100 mg Intravenous q1600 Elodia Florence., MD      . HYDROmorphone (DILAUDID) injection 0.5-1 mg  0.5-1 mg Intravenous Q2H PRN Allie Bossier, MD   1 mg at 01/30/20 2210  . insulin  aspart (novoLOG) injection 0-9 Units  0-9 Units Subcutaneous Q4H Elodia Florence., MD      . lidocaine-prilocaine (EMLA) cream   Topical PRN Allie Bossier, MD   1 application at 46/65/99 0845  . methocarbamol (ROBAXIN) 500 mg in dextrose 5 % 50 mL IVPB  500 mg Intravenous Q6H PRN Allie Bossier, MD      . ondansetron [facility]) tablet 4 mg  4 mg Oral Q6H PRN Allie Bossier, MD       Or  . ondansetron [facility]) injection 4 mg  4 mg Intravenous Q6H PRN Allie Bossier, MD   4 mg at 01/30/20 2216  . pantoprazole (PROTONIX) injection 40 mg  40 mg Intravenous Q12H Elodia Florence., MD      . potassium PHOSPHATE 10 mmol in dextrose 5 % 250 mL infusion  10 mmol Intravenous Once Elodia Florence., MD 42 mL/hr at 01/31/20 1120 10 mmol at 01/31/20 1120  . promethazine (PHENERGAN) injection 12.5 mg  12.5 mg Intravenous Q6H PRN Elodia Florence., MD      . sodium chloride flush (NS) 0.9 % injection 10-40 mL  10-40 mL Intracatheter PRN Allie Bossier, MD      . TPN ADULT (ION)   Intravenous Continuous TPN Elodia Florence., MD       Facility-Administered Medications Ordered in Other Encounters  Medication Dose Route Frequency Provider Last Rate Last Admin  . heparin lock flush 100 unit/mL  500 Units Intracatheter Once PRN [name], MD      . sodium chloride flush (NS) 0.9 % injection 10 mL  10 mL Intracatheter PRN [name], MD        Allergies as of 01/29/2020  . (No Known Allergies)    Family History  Problem Relation Age of Onset  . Hypertension Mother   .  Lupus Mother   . Diabetes Father   . Hypertension Sister   . Diabetes Paternal Grandmother   . Colon cancer Paternal Grandmother 75  . CAD Brother   . Diabetes Brother   . Heart attack Brother   . Breast cancer Maternal Aunt        dx >50  . Heart attack Paternal Grandfather   . Breast cancer Maternal Aunt        dx under 65  . Breast cancer Maternal Aunt        dx  under 46    Social History    Socioeconomic History  . Marital status: Legally Separated    Spouse name: Not on file  . Number of children: 2  . Years of education: Not on file  . Highest education level: Not on file  Occupational History  . Not on file  Tobacco Use  . Smoking status: Never Smoker  . Smokeless tobacco: Never Used  Substance and Sexual Activity  . Alcohol use: No  . Drug use: Never  . Sexual activity: Not on file  Other Topics Concern  . Not on file  Social History Narrative   1 boy and 1 girl   Resides in [place] Determinants of Health   Financial Resource Strain:   . Difficulty of Paying Living Expenses:   Food Insecurity:   . Worried About Charity fundraiser in the Last Year:   . Arboriculturist in the Last Year:   Transportation Needs:   . Film/video editor (Medical):   Marland Kitchen Lack of Transportation (Non-Medical):   Physical Activity:   . Days of Exercise per Week:   . Minutes of Exercise per Session:   Stress:   . Feeling of Stress :   Social Connections:   . Frequency of Communication with Friends and Family:   . Frequency of Social Gatherings with Friends and Family:   . Attends Religious Services:   . Active Member of Clubs or Organizations:   . Attends Archivist Meetings:   Marland Kitchen Marital Status:   Intimate Partner Violence:   . Fear of Current or Ex-Partner:   . Emotionally Abused:   Marland Kitchen Physically Abused:   . Sexually Abused:     Review of Systems: All systems reviewed and negative except where noted in HPI.  Physical Exam: Vital signs in last 24 hours: Temp:  [98.4 F (36.9 C)-98.6 F (37 C)] 98.6 F (37 C) (05/19 1415) Pulse Rate:  [81-90] 81 (05/19 1415) Resp:  [16-18] 18 (05/19 1415) BP: (126-162)/(64-72) 132/69 (05/19 1415) SpO2:  [96 %-99 %] 98 % (05/19 1415) Last BM Date: (PTA) General:   Alert, well-developed,  female in NAD Psych:  Pleasant, cooperative. Normal mood and affect. Eyes:  Pupils equal, sclera clear,  no icterus.   Conjunctiva pink. Ears:  Deaf Nose:  No deformity, discharge,  or lesions. Neck:  Supple; no masses Lungs:  Clear throughout to auscultation.   No wheezes, crackles, or rhonchi.  Heart:  Regular rate and rhythm; no murmurs, no lower extremity edema Abdomen:  Soft, non-distended, mild RUQ tenderness, BS active, no palp mass   Rectal:  Deferred  Msk:  Symmetrical without gross deformities. . Neurologic:  Alert and  oriented x4;  grossly normal neurologically. Skin:  Intact without significant lesions or rashes.   Intake/Output from previous day: 05/18 0701 - 05/19 0700 In: 799.5 [I.V.:762.8; IV Piggyback:36.7] Out: -  Intake/Output  this shift: No intake/output data recorded.  Lab Results: Recent Labs    01/29/20 2122 01/30/20 0520 01/31/20 0614  WBC 4.8 4.5 4.3  HGB 10.3* 9.3* 9.1*  HCT 31.4* 28.6* 27.6*  PLT 100* 105* 97*   BMET Recent Labs    01/29/20 2122 01/29/20 2122 01/30/20 0520 01/30/20 0520 01/30/20 0813 01/30/20 1740 01/31/20 0614  NA 135  --  139  --   --   --  135  K 3.3*   < > 3.3*   < > 4.0 3.2* 3.6  CL 104  --  106  --   --   --  103  CO2 19*  --  21*  --   --   --  25  GLUCOSE 83  --  129*  --   --   --  162*  BUN 5*  --  <5*  --   --   --  <5*  CREATININE 0.40*  --  0.45  --   --   --  0.33*  CALCIUM 6.8*  --  7.1*  --   --   --  6.6*   < > = values in this interval not displayed.   LFT Recent Labs    01/31/20 0614  PROT 6.5  ALBUMIN 2.6*  AST 19  ALT 14  ALKPHOS 76  BILITOT 0.4   PT/INR Recent Labs    01/30/20 0520  LABPROT 13.8  INR 1.1   Hepatitis Panel No results for input(s): HEPBSAG, HCVAB, HEPAIGM, HEPBIGM in the last 72 hours.   . CBC Latest Ref Rng & Units 01/31/2020 01/30/2020 01/29/2020  WBC 4.0 - 10.5 K/uL 4.3 4.5 4.8  Hemoglobin 12.0 - 15.0 g/dL 9.1(L) 9.3(L) 10.3(L)  Hematocrit 36.0 - 46.0 % 27.6(L) 28.6(L) 31.4(L)  Platelets 150 - 400 K/uL 97(L) 105(L) 100(L)    . CMP Latest Ref Rng & Units  01/31/2020 01/30/2020 01/30/2020  Glucose 70 - 99 mg/dL 162(H) - -  BUN 6 - 20 mg/dL <5(L) - -  Creatinine 0.44 - 1.00 mg/dL 0.33(L) - -  Sodium 135 - 145 mmol/L 135 - -  Potassium 3.5 - 5.1 mmol/L 3.6 3.2(L) 4.0  Chloride 98 - 111 mmol/L 103 - -  CO2 22 - 32 mmol/L 25 - -  Calcium 8.9 - 10.3 mg/dL 6.6(L) - -  Total Protein 6.5 - 8.1 g/dL 6.5 - -  Total Bilirubin 0.3 - 1.2 mg/dL 0.4 - -  Alkaline Phos 38 - 126 U/L 76 - -  AST 15 - 41 U/L 19 - -  ALT 0 - 44 U/L 14 - -   Studies/Results: X-ray chest PA and lateral  Result Date: 01/29/2020 CLINICAL DATA:  Poor intake for 20 days with emesis EXAM: CHEST - 2 VIEW COMPARISON:  CT 01/01/2020 FINDINGS: No consolidation, features of edema, pneumothorax, or effusion. The cardiomediastinal contours are unremarkable. Right IJ approach Port-A-Cath tip terminates in the mid SVC. Sclerotic lesions are seen in the T8 and T9 vertebrae. Additional known osseous metastases disease to the spine is poorly visualized on radiography. Sclerotic and lucent changes of the right glenoid may reflect additional site of metastatic disease. No other acute or worrisome osseous or soft tissue abnormality. IMPRESSION: 1. No acute cardiopulmonary abnormality. 2. Extensive osseous metastatic disease including the spine and right glenoid. Much of which is better visualized on comparison CT. Electronically Signed   By: Lovena Le M.D.   On: 01/29/2020 18:59   [place] EKG SITE RITE  Result Date: 01/30/2020 If Site Rite image not attached, placement could not be confirmed due to current cardiac rhythm.   Active Problems:   Hearing loss   DM type 2 (diabetes mellitus, type 2) ([place])   Fallopian tube cancer, carcinoma, right ([place])   Bone metastases ([place])   Dysphagia   Radiation-induced esophagitis   Esophageal candidiasis ([place])    Tye Savoy, NP-C @  01/31/2020, 4:47 PM  I have reviewed the entire case in detail with the above APP and discussed the plan in detail when we  saw the patient together last evening.(Epic access problem prevented me from signing it last evening)   Therefore, I agree with the diagnoses recorded above. In addition,  I have personally interviewed and examined the patient along with my APP.  My additional thoughts are as follows:  Was difficult to get a clear and consistent history.  Reviewed Dr. Woodward Ku note as well.  Recent severe dysphagia and odynophagia suspected to be from radiation induced esophagitis.  Possible candida as well and on empiric therapy for that.  Consider viral causes such as HSV/CMV. Barium esophogram planned, needs TPN (she has been refusing PEG)  If still unclear and no clinical progress with above, EGD would be warranted during this hospitalization.  (Dr. Ardis Hughs will follow patient)   Nelida Meuse III Office:[phone]

## 2020-01-31 NOTE — Progress Notes (Signed)
Initial Nutrition Assessment  INTERVENTION:   Monitor magnesium, potassium, and phosphorus daily for at least 3 days, MD to replete as needed, as pt is at risk for refeeding syndrome.  TPN per Pharmacy  NUTRITION DIAGNOSIS:   Severe Malnutrition related to chronic illness, cancer and cancer related treatments, acute illness(radiation induced esophagitis) as evidenced by percent weight loss, energy intake < 75% for > 7 days.  GOAL:   Patient will meet greater than or equal to 90% of their needs  MONITOR:   Labs, Weight trends, I & O's(TPN)  REASON FOR ASSESSMENT:   Consult New TPN/TNA  ASSESSMENT:   59 year old BF PMHx DM type II controlled without complication, breast cancer, metastatic fallopian tube carcinoma, with metastasis to bone/spine that is post TAH/BSO, S/P chemotherapy and radiation is here for follow-up visit accompanied by her mother and sign language interpreter for  dysphagia and odynophagia. Admitted for radiation induced esophagitis.  5/18: PICC placed  Pt currently NPO, awaiting initiation of TPN today.  Pt in room with mother and RN at bedside. RN communicated with pt via writing and pt requested that RD return at a later time. Pt is deaf and requires ASL interpreter. Per pt's mother, pt has had poor PO intakes for 22 days now. Pt not interested in drinking protein shakes. Per chart review, pt's mother had reported pt was not wanting any sweet supplements or beverages as they would cause nausea. Mainly has been consuming water PO. Pt has painful swallowing related to radiation treatments.   Pt is not interested in PEG for nutrition support.  Per Pharmacy note, TPN to start at 30 ml/hr (providing ~637 kcals and 28 protein). Recommend monitor for refeeding syndrome.   Per weight records, pt has lost 21 lbs since 3/23 (13% wt loss x 1.5 months, significant for time frame). Pt meets criteria for malnutrition given poor PO intakes for 22 days and significant weight  loss. Suspect NFPE would reveal muscle and fat depletions but unable to perform at this time.  Medications: D5 infusion, IV Mg sulfate, K-Phos infusion Labs reviewed: CBGs: 140-145 Low Mg/Phos  NUTRITION - FOCUSED PHYSICAL EXAM:  Pt refused at this time.  Diet Order:   Diet Order            Diet NPO time specified  Diet effective now              EDUCATION NEEDS:   No education needs have been identified at this time  Skin:  Skin Assessment: Reviewed RN Assessment  Last BM:  PTA  Height:   Ht Readings from Last 1 Encounters:  01/30/20 4\' 11"  (1.499 m)    Weight:   Wt Readings from Last 1 Encounters:  01/30/20 61.4 kg    Ideal Body Weight:     BMI:  Body mass index is 27.34 kg/m.  Estimated Nutritional Needs:   Kcal:  1800-2000  Protein:  90-100g  Fluid:  2L/day  Clayton Bibles, MS, RD, LDN Inpatient Clinical Dietitian Contact information available via Amion

## 2020-02-01 ENCOUNTER — Telehealth: Payer: Self-pay | Admitting: *Deleted

## 2020-02-01 ENCOUNTER — Ambulatory Visit: Payer: Medicaid Other | Admitting: Hematology and Oncology

## 2020-02-01 ENCOUNTER — Other Ambulatory Visit: Payer: Medicaid Other

## 2020-02-01 ENCOUNTER — Inpatient Hospital Stay (HOSPITAL_COMMUNITY): Payer: Medicare Other

## 2020-02-01 DIAGNOSIS — T66XXXA Radiation sickness, unspecified, initial encounter: Secondary | ICD-10-CM

## 2020-02-01 DIAGNOSIS — K208 Other esophagitis without bleeding: Secondary | ICD-10-CM

## 2020-02-01 LAB — CBC WITH DIFFERENTIAL/PLATELET
Abs Immature Granulocytes: 0.04 10*3/uL (ref 0.00–0.07)
Basophils Absolute: 0 10*3/uL (ref 0.0–0.1)
Basophils Relative: 1 %
Eosinophils Absolute: 0.1 10*3/uL (ref 0.0–0.5)
Eosinophils Relative: 2 %
HCT: 27.7 % — ABNORMAL LOW (ref 36.0–46.0)
Hemoglobin: 8.7 g/dL — ABNORMAL LOW (ref 12.0–15.0)
Immature Granulocytes: 1 %
Lymphocytes Relative: 17 %
Lymphs Abs: 0.6 10*3/uL — ABNORMAL LOW (ref 0.7–4.0)
MCH: 28.9 pg (ref 26.0–34.0)
MCHC: 31.4 g/dL (ref 30.0–36.0)
MCV: 92 fL (ref 80.0–100.0)
Monocytes Absolute: 0.5 10*3/uL (ref 0.1–1.0)
Monocytes Relative: 14 %
Neutro Abs: 2.3 10*3/uL (ref 1.7–7.7)
Neutrophils Relative %: 65 %
Platelets: 94 10*3/uL — ABNORMAL LOW (ref 150–400)
RBC: 3.01 MIL/uL — ABNORMAL LOW (ref 3.87–5.11)
RDW: 18.1 % — ABNORMAL HIGH (ref 11.5–15.5)
WBC: 3.5 10*3/uL — ABNORMAL LOW (ref 4.0–10.5)
nRBC: 0 % (ref 0.0–0.2)

## 2020-02-01 LAB — GLUCOSE, CAPILLARY
Glucose-Capillary: 120 mg/dL — ABNORMAL HIGH (ref 70–99)
Glucose-Capillary: 122 mg/dL — ABNORMAL HIGH (ref 70–99)
Glucose-Capillary: 133 mg/dL — ABNORMAL HIGH (ref 70–99)
Glucose-Capillary: 138 mg/dL — ABNORMAL HIGH (ref 70–99)
Glucose-Capillary: 146 mg/dL — ABNORMAL HIGH (ref 70–99)
Glucose-Capillary: 152 mg/dL — ABNORMAL HIGH (ref 70–99)

## 2020-02-01 LAB — COMPREHENSIVE METABOLIC PANEL
ALT: 15 U/L (ref 0–44)
AST: 22 U/L (ref 15–41)
Albumin: 2.6 g/dL — ABNORMAL LOW (ref 3.5–5.0)
Alkaline Phosphatase: 78 U/L (ref 38–126)
Anion gap: 7 (ref 5–15)
BUN: <5 mg/dL — ABNORMAL LOW (ref 6–20)
CO2: 25 mmol/L (ref 22–32)
Calcium: 6.9 mg/dL — ABNORMAL LOW (ref 8.9–10.3)
Chloride: 103 mmol/L (ref 98–111)
Creatinine, Ser: 0.41 mg/dL — ABNORMAL LOW (ref 0.44–1.00)
GFR calc Af Amer: >60 mL/min (ref >60)
GFR calc non Af Amer: >60 mL/min (ref >60)
Glucose, Bld: 146 mg/dL — ABNORMAL HIGH (ref 70–99)
Potassium: 3.1 mmol/L — ABNORMAL LOW (ref 3.5–5.1)
Sodium: 135 mmol/L (ref 135–145)
Total Bilirubin: 0.4 mg/dL (ref 0.3–1.2)
Total Protein: 6.6 g/dL (ref 6.5–8.1)

## 2020-02-01 LAB — HEMOGLOBIN A1C
Hgb A1c MFr Bld: 5.9 % — ABNORMAL HIGH (ref 4.8–5.6)
Mean Plasma Glucose: 123 mg/dL

## 2020-02-01 LAB — MAGNESIUM: Magnesium: 1.9 mg/dL (ref 1.7–2.4)

## 2020-02-01 LAB — PHOSPHORUS: Phosphorus: 1.6 mg/dL — ABNORMAL LOW (ref 2.5–4.6)

## 2020-02-01 MED ORDER — POTASSIUM PHOSPHATES 15 MMOLE/5ML IV SOLN
20.0000 mmol | Freq: Once | INTRAVENOUS | Status: AC
  Administered 2020-02-01: 20 mmol via INTRAVENOUS
  Filled 2020-02-01: qty 6.67

## 2020-02-01 MED ORDER — DIATRIZOATE MEGLUMINE & SODIUM 66-10 % PO SOLN
60.0000 mL | Freq: Once | ORAL | Status: DC
  Filled 2020-02-01: qty 60

## 2020-02-01 MED ORDER — DIATRIZOATE MEGLUMINE & SODIUM 66-10 % PO SOLN
ORAL | Status: AC
  Filled 2020-02-01: qty 30

## 2020-02-01 MED ORDER — FLUCONAZOLE IN SODIUM CHLORIDE 200-0.9 MG/100ML-% IV SOLN
200.0000 mg | INTRAVENOUS | Status: DC
  Administered 2020-02-02 – 2020-02-03 (×2): 200 mg via INTRAVENOUS
  Filled 2020-02-01 (×2): qty 100

## 2020-02-01 MED ORDER — TRAVASOL 10 % IV SOLN
INTRAVENOUS | Status: AC
  Filled 2020-02-01: qty 480

## 2020-02-01 MED ORDER — FLUCONAZOLE IN SODIUM CHLORIDE 400-0.9 MG/200ML-% IV SOLN
400.0000 mg | Freq: Once | INTRAVENOUS | Status: AC
  Administered 2020-02-01: 400 mg via INTRAVENOUS
  Filled 2020-02-01: qty 200

## 2020-02-01 NOTE — Telephone Encounter (Signed)
Received call from pt sister Olivia Mackie, requesting to speak with MD regarding pt being discharged home and the possibility of being on TPN while at home.  RN will review with MD to contact Olivia Mackie at [phone].

## 2020-02-01 NOTE — Progress Notes (Addendum)
Oncology: Reviewed chart and discussed with patients family. Family anxious that she may be sent home without TPN becoming approved.  My recommendation is that she needs to either have TPN approved for out patient or she is able to keep fluids and soft food down. Clear liquids today is encouraging We are not able to start her new treatment until shes able to swallow pills.  Patient needs TPN and anticipated need for over 90 days.

## 2020-02-01 NOTE — Progress Notes (Signed)
PHARMACY - TOTAL PARENTERAL NUTRITION CONSULT NOTE   Indication: Severe dysphagia secondary to radiation therapy, declined PEG tube  Patient Measurements: Height: 4\' 11"  (149.9 cm) Weight: 61.4 kg (135 lb 6 oz) IBW/kg (Calculated) : 43.2 TPN AdjBW (KG): 47.8 Body mass index is 27.34 kg/m. Usual Weight: ~ 70 kg  Sodium  Date Value  02/01/2020 135 mmol/L  04/06/2019 143 mmol/L  09/15/2017 139 mEq/L   Potassium  Date Value  02/01/2020 3.1 mmol/L (L)  09/15/2017 3.8 mEq/L   Phosphorus (mg/dL)  Date Value  02/01/2020 1.6 (L)   Magnesium (mg/dL)  Date Value  02/01/2020 1.9   Calcium (mg/dL)  Date Value  02/01/2020 6.9 (L)  09/15/2017 9.2   Albumin (g/dL)  Date Value  02/01/2020 2.6 (L)  04/06/2019 4.3  09/15/2017 3.3 (L)   CBG (last 3)  Recent Labs    01/31/20 2010 02/01/20 0011 02/01/20 0351  GLUCAP 141* 146* 152*    Assessment: 59 y/o F with a h/o metastatic breast cancer, DM, and esophageal candidiasis admitted to initiate TPN therapy due to prolonged malnutrition from severe dysphagia related to radiation therapy. Patient declined PEG tube placement.   Glucose / Insulin: 24h: CBGs 138-152, 4 units SSI PTA: glimepiride and metformin/ no insulin Electrolytes: K and phos low this am, Mg wnl, Corr Ca 8 after calcium gluconate yesterday Renal: SCr < 1, stable LFTs / TGs: LFTs nl, TG 93 Prealbumin / albumin: 8.1 (5/19)/2.6  Intake / Output; MIVF: not adhering to strict I/Os? D5NS @ 45 cc/hr GI Imaging: Surgeries / Procedures:   Central access: 5/18 TPN start date:  5/19  Nutritional Goals (per RD recommendation on 5/19): kCal: 1800-2000, Protein: 90-100, Fluid: 2L/day Goal TPN rate is 80 mL/hr (provides 96 g of protein and 1939 kcals per day)  Current Nutrition:  TPN @ 30 ml/hr providing 637 kcal and 28.8 g protein +D5NS @ 45 cc/hr  Plan:  Now:  - Kphos 20 mmol IV once  At 1800: Will continue TPN at half goal rate with plans to hopefully advance  tomorrow if lytes are stable TPN at 40 mL/hr at 1800 providing 970 kCal w/ 80 g protein Electrolytes in TPN: 71mEq/L of Na, inc to  31mEq/L of K, 12mEq/L of Ca, inc to 7.59mEq/L of Mg, and inc to 97mmol/L of Phos. Cl:Ac ratio 1:1 Add standard MVI and trace elements to TPN Continue Sensitive q4h SSI and adjust as needed  Consider reducing to q6h if CBGs remain at goal Reduce MIVF to 35 mL/hr at 1800 Monitor TPN labs on Mon/Thurs Advance TPN to goal as tolerated  Ulice Dash D 02/01/2020,7:31 AM

## 2020-02-01 NOTE — Progress Notes (Signed)
PROGRESS NOTE    Erin [name]  [id number] DOB: 01-07-1961 DOA: 01/29/2020 PCP: Antony Blackbird, MD   Chief Complaint  Patient presents with  . Abdominal Pain  . Emesis   Brief Narrative:  8 year oldBFPMHxDM type II controlled without complication, breast cancer,metastatic fallopian tube carcinoma,with metastasis to bone/spinethat is post TAH/BSO, S/Pchemotherapy and radiationis here for follow-up visit accompanied by her mother and sign language interpreter for dysphagia and odynophagia Most recent palliative radiation to spinal cord April 20 to Jan 19, 2020  She is havingsignificant difficulty swallowing post radiation, receiving IV fluids dailyat cancer center. She is only drinking alkaline water but otherwise has not been able to eat anything. Per her mother she does not like to drink any sugary juices, boost breeze, Gatorade, Pedialyte, Jell-O or even popsicles. States they are too sweet and makes her nauseous. She has not tried any broth or clear soups.  Her chest hurts when she tries to eat anything and sometimes even with saliva,she feels the chest hurts all the time. No significant improvement with Carafate tablets, she dissolves the tablets prior to drinking it as Idona Stach slurry.  Oncologic history positive for breast cancer s/p lumpectomy, adjuvant chemotherapy and radiation therapy  Colonoscopy November 02, 2017: Removal of 2 small sessile polyps [tubular adenoma X1 and hyperplastic polypX1], internal hemorrhoids otherwise normal exam   Assessment & Plan:   Active Problems:   Hearing loss   DM type 2 (diabetes mellitus, type 2) (HCC)   Fallopian tube cancer, carcinoma, right (HCC)   Bone metastases (HCC)   Dysphagia   Radiation-induced esophagitis   Esophageal candidiasis (HCC)  Radiation induced esophagitis? -GI c/s, appreciate assistance - Esophagram negative for geode stricture or mass, no mucosal edema or ulceration  Esophageal  candidiasis -Per GI recommendation empirically treatfluconazole suspension 100 mg daily for 7 days -> not able to tolerate PO, will transition to IV -GI cocktailPRNpain -IV PPI bid -Carafate suspension 1 gqac + qhs -Per patient not interested in Isayah Ignasiak PEG tube for nutrition  Protein calorie malnutrition  -D5-0.9% saline 82ml/hr -Appears that GI may have wanted patient to start TPN, will need to investigate further in the Chase Arnall.m. did they want to start TPN and cannot be run through [place] [facility]. -continue TPN - she's improved, trying clear liquids today  Electrolyte Abnormalities  Hypokalemia  Hypomagnesemia  Hypophosphatemia - replace and follow (per pharmacy) - follow electrolytes daily  DM type II controlled without complication -0000000 -A1c 5.8 -Lipid panel -> HDL 18 -Sensitive SSI  Deaf -Stable  DVT prophylaxis: lovenox Code Status: full  Family Communication: mother at bedside Disposition:   Status is: Inpatient  Remains inpatient appropriate because:Inpatient level of care appropriate due to severity of illness   Dispo: The patient is from: Home              Anticipated d/c is to: pending              Anticipated d/c date is: > 3 days              Patient currently is not medically stable to d/c.    Consultants:   oncology  Procedures:  PICC placement 5/18  Antimicrobials:  Anti-infectives (From admission, onward)   Start     Dose/Rate Route Frequency Ordered Stop   02/02/20 1000  fluconazole (DIFLUCAN) IVPB 200 mg     200 mg 100 mL/hr over 60 Minutes Intravenous Every 24 hours 02/01/20 0938     02/01/20 1030  fluconazole (DIFLUCAN) IVPB  400 mg     400 mg 100 mL/hr over 120 Minutes Intravenous  Once 02/01/20 0938 02/01/20 1340   01/31/20 1700  fluconazole (DIFLUCAN) IVPB 100 mg  Status:  Discontinued     100 mg 50 mL/hr over 60 Minutes Intravenous Daily-1600 01/31/20 1534 02/01/20 0938   01/30/20 1530  fluconazole (DIFLUCAN)  40 MG/ML suspension 100 mg  Status:  Discontinued     100 mg Oral Daily 01/30/20 1354 01/31/20 1534      Subjective: Feeling better Asking about eating  Objective: Vitals:   01/31/20 1415 01/31/20 2226 02/01/20 0541 02/01/20 1255  BP: 132/69 125/68 119/71 137/70  Pulse: 81 81 86 88  Resp: 18 20 16 20   Temp: 98.6 F (37 C) (!) 97.5 F (36.4 C) 98.2 F (36.8 C) 98.5 F (36.9 C)  TempSrc: Oral Oral Oral Oral  SpO2: 98% 97% 99% 96%  Weight:      Height:        Intake/Output Summary (Last 24 hours) at 02/01/2020 1534 Last data filed at 02/01/2020 1000 Gross per 24 hour  Intake 260.67 ml  Output --  Net 260.67 ml   Filed Weights   01/30/20 0828  Weight: 61.4 kg    Examination:  General: No acute distress. Cardiovascular: Heart sounds show Tore Carreker regular rate, and rhythm.  Lungs: Clear to auscultation bilaterally Abdomen: Soft, nontender, nondistended  Neurological: Alert and oriented 3. Moves all extremities 4. Cranial nerves II through XII grossly intact. Skin: Warm and dry. No rashes or lesions. Extremities: No clubbing or cyanosis. No edema.   Data Reviewed: I have personally reviewed following labs and imaging studies  CBC: Recent Labs  Lab 01/26/20 0825 01/26/20 0825 01/29/20 1606 01/29/20 2122 01/30/20 0520 01/31/20 0614 02/01/20 0408  WBC 5.1  --  5.6 4.8 4.5 4.3 3.5*  NEUTROABS 3.9  --   --   --  3.1 3.1 2.3  HGB 9.9*  --  10.9* 10.3* 9.3* 9.1* 8.7*  HCT 30.2*   < > 33.8* 31.4* 28.6* 27.6* 27.7*  MCV 90.7   < > 91.6 90.5 90.8 91.4 92.0  PLT 87*  --  120* 100* 105* 97* 94*   < > = values in this interval not displayed.    Basic Metabolic Panel: Recent Labs  Lab 01/26/20 0825 01/26/20 0825 01/29/20 1606 01/29/20 1606 01/29/20 2122 01/29/20 2122 01/30/20 0520 01/30/20 0813 01/30/20 1740 01/31/20 0614 02/01/20 0408  NA 138   < > 137  --  135  --  139  --   --  135 135  K 3.0*   < > 3.1*   < > 3.3*   < > 3.3* 4.0 3.2* 3.6 3.1*  CL 103   < >  99  --  104  --  106  --   --  103 103  CO2 22   < > 22  --  19*  --  21*  --   --  25 25  GLUCOSE 97   < > 95  --  83  --  129*  --   --  162* 146*  BUN 5*   < > 8  --  5*  --  <5*  --   --  <5* <5*  CREATININE 0.57  --  0.45  --  0.40*  --  0.45  --   --  0.33* 0.41*  CALCIUM 7.5*   < > 7.7*  --  6.8*  --  7.1*  --   --  6.6* 6.9*  MG 1.8   < > 2.2   < > 1.8  --  2.0  --  1.7 1.6* 1.9  PHOS 1.1*  --   --   --   --   --  <1.0*  --  1.4* 2.3* 1.6*   < > = values in this interval not displayed.    GFR: Estimated Creatinine Clearance: 62.6 mL/min (Whitni Pasquini) (by C-G formula based on SCr of 0.41 mg/dL (L)).  Liver Function Tests: Recent Labs  Lab 01/26/20 0825 01/26/20 0825 01/29/20 1606 01/29/20 2122 01/30/20 0520 01/31/20 0614 02/01/20 0408  AST 23  --  23  --  21 19 22   ALT 12  --  16  --  14 14 15   ALKPHOS 89  --  85  --  76 76 78  BILITOT 0.3  --  0.9  --  0.5 0.4 0.4  PROT 7.4  --  8.1  --  7.1 6.5 6.6  ALBUMIN 2.8*   < > 3.3* 2.7* 2.9* 2.6* 2.6*   < > = values in this interval not displayed.    CBG: Recent Labs  Lab 01/31/20 2010 02/01/20 0011 02/01/20 0351 02/01/20 0752 02/01/20 1251  GLUCAP 141* 146* 152* 133* 138*     Recent Results (from the past 240 hour(s))  SARS Coronavirus 2 by RT PCR (hospital order, performed in [facility] hospital lab) Nasopharyngeal Nasopharyngeal Swab     Status: None   Collection Time: 01/29/20  5:26 PM   Specimen: Nasopharyngeal Swab  Result Value Ref Range Status   SARS Coronavirus 2 NEGATIVE NEGATIVE Final    Comment: (NOTE) SARS-CoV-2 target nucleic acids are NOT DETECTED. The SARS-CoV-2 RNA is generally detectable in upper and lower respiratory specimens during the acute phase of infection. The lowest concentration of SARS-CoV-2 viral copies this assay can detect is 250 copies / mL. Denine Brotz negative result does not preclude SARS-CoV-2 infection and should not be used as the sole basis for treatment or other patient management  decisions.  Claretha Townshend negative result may occur with improper specimen collection / handling, submission of specimen other than nasopharyngeal swab, presence of viral mutation(s) within the areas targeted by this assay, and inadequate number of viral copies (<250 copies / mL). Ata Pecha negative result must be combined with clinical observations, patient history, and epidemiological information. Fact Sheet for Patients:   StrictlyIdeas.no Fact Sheet for Healthcare Providers: BankingDealers.co.za This test is not yet approved or cleared  by the [place] FDA and has been authorized for detection and/or diagnosis of SARS-CoV-2 by FDA under an Emergency Use Authorization (EUA).  This EUA will remain in effect (meaning this test can be used) for the duration of the COVID-19 declaration under Section 564(b)(1) of the Act, 21 U.S.C. section 360bbb-3(b)(1), unless the authorization is terminated or revoked sooner. Performed at [facility], [place] [address], [place], [place] [address]          Radiology Studies: DG ESOPHAGUS W SINGLE CM (SOL OR THIN BA)  Result Date: 02/01/2020 CLINICAL DATA:  Vomiting. Burning in chest. Rule out esophagitis. Breast cancer. EXAM: ESOPHOGRAM/BARIUM SWALLOW TECHNIQUE: Single contrast examination was performed using  Gastrografin. FLUOROSCOPY TIME:  Fluoroscopy Time:  1 minutes 24 seconds Radiation Exposure Index (if provided by the fluoroscopic device): Number of Acquired Spot Images: 0 COMPARISON:  CT chest 01/01/2020 FINDINGS: Patient is deaf. Sign language interpreter assisted with communication. Patient swallowed Gastrografin in the upright position. Esophageal mucosa and motility normal. No  esophageal thickening. No ulcer or mass. Negative for stricture. Small hiatal hernia. Barium tablet passed readily into the stomach without delay IMPRESSION: Negative for soft Alece Koppel geode stricture or mass. No mucosal edema  or ulceration. Small hiatal hernia Barium tablet passed without delay. Electronically Signed   By: Franchot Gallo M.D.   On: 02/01/2020 12:56        Scheduled Meds: . Chlorhexidine Gluconate Cloth  6 each Topical Daily  . Chlorhexidine Gluconate Cloth  6 each Topical Daily  . diatrizoate meglumine-sodium  60 mL Oral Once  . diatrizoate meglumine-sodium      . enoxaparin (LOVENOX) injection  40 mg Subcutaneous Q24H  . insulin aspart  0-9 Units Subcutaneous Q4H  . magic mouthwash  10 mL Oral TID  . pantoprazole (PROTONIX) IV  40 mg Intravenous Q12H   Continuous Infusions: . dextrose 5 % and 0.9% NaCl 45 mL/hr at 02/01/20 0859  . [START ON 02/02/2020] fluconazole (DIFLUCAN) IV    . methocarbamol (ROBAXIN) IV    . TPN ADULT (ION) 30 mL/hr at 01/31/20 1736  . TPN ADULT (ION)       LOS: 3 days    Time spent: over 30 min    Fayrene Helper, MD [facility]   To contact the attending provider between 7A-7P or the covering provider during after hours 7P-7A, please log into the web site [email or web address] and access using universal [place] password for that web site. If you do not have the password, please call the hospital operator.  02/01/2020, 3:34 PM

## 2020-02-01 NOTE — Progress Notes (Addendum)
Progress Note  CC:   vomiting    ASSESSMENT AND PLAN:   59 year old female with PMH significant for DM, metastatic breast cancer with bone metastases, fallopian tube cancer s/p TAH / BSO / chemotherapy / radiation, deafness, chronic back pain, adenomatous colon polyps  # Nausea / vomiting / burning in chest  --Mother in room. The nausea / vomiting  / chest discomfort have resolved. She is tolerating liquids. Doesn't want to advance to fulls yet. --Esophagram negative.l --continue IV Diflucan, IV PPI, IV Zofran, magic mouthwash --If continues to improve then trial of full liquids tomorrow.  --Continue TNA   # Deafness  # Breast cancer with bony mets --Recent palliative radiation to spinal cord    SUBJECTIVE    Feels much better today. No N/V or chest discomfort.    OBJECTIVE:     Vital signs in last 24 hours: Temp:  [97.5 F (36.4 C)-98.5 F (36.9 C)] 98.5 F (36.9 C) (05/20 1255) Pulse Rate:  [81-88] 88 (05/20 1255) Resp:  [16-20] 20 (05/20 1255) BP: (119-137)/(68-71) 137/70 (05/20 1255) SpO2:  [96 %-99 %] 96 % (05/20 1255) Last BM Date: (patient can't remember ) General:   Alert, in NAD Heart:  Regular rate and rhythm.  No lower extremity edema   Pulm: Normal respiratory effort   Abdomen:  Soft,  nontender, nondistended.  Normal bowel sounds.          Psych:  Pleasant, cooperative.  Normal mood and affect.   Intake/Output from previous day: No intake/output data recorded. Intake/Output this shift: Total I/O In: 260.7 [I.V.:165.3; IV Piggyback:95.4] Out: -   Lab Results: Recent Labs    01/30/20 0520 01/31/20 0614 02/01/20 0408  WBC 4.5 4.3 3.5*  HGB 9.3* 9.1* 8.7*  HCT 28.6* 27.6* 27.7*  PLT 105* 97* 94*   BMET Recent Labs    01/30/20 0520 01/30/20 0813 01/30/20 1740 01/31/20 0614 02/01/20 0408  NA 139  --   --  135 135  K 3.3*   < > 3.2* 3.6 3.1*  CL 106  --   --  103 103  CO2 21*  --   --  25 25  GLUCOSE 129*  --   --  162*  146*  BUN <5*  --   --  <5* <5*  CREATININE 0.45  --   --  0.33* 0.41*  CALCIUM 7.1*  --   --  6.6* 6.9*   < > = values in this interval not displayed.   LFT Recent Labs    02/01/20 0408  PROT 6.6  ALBUMIN 2.6*  AST 22  ALT 15  ALKPHOS 78  BILITOT 0.4   PT/INR Recent Labs    01/30/20 0520  LABPROT 13.8  INR 1.1   Hepatitis Panel No results for input(s): HEPBSAG, HCVAB, HEPAIGM, HEPBIGM in the last 72 hours.  DG ESOPHAGUS W SINGLE CM (SOL OR THIN BA)  Result Date: 02/01/2020 CLINICAL DATA:  Vomiting. Burning in chest. Rule out esophagitis. Breast cancer. EXAM: ESOPHOGRAM/BARIUM SWALLOW TECHNIQUE: Single contrast examination was performed using  Gastrografin. FLUOROSCOPY TIME:  Fluoroscopy Time:  1 minutes 24 seconds Radiation Exposure Index (if provided by the fluoroscopic device): Number of Acquired Spot Images: 0 COMPARISON:  CT chest 01/01/2020 FINDINGS: Patient is deaf. Sign language interpreter assisted with communication. Patient swallowed Gastrografin in the upright position. Esophageal mucosa and motility normal. No esophageal thickening. No ulcer or mass. Negative for stricture. Small hiatal hernia. Barium tablet passed readily into the  stomach without delay IMPRESSION: Negative for soft a geode stricture or mass. No mucosal edema or ulceration. Small hiatal hernia Barium tablet passed without delay. Electronically Signed   By: Franchot Gallo M.D.   On: 02/01/2020 12:56       LOS: 3 days   Tye Savoy ,NP 02/01/2020, 2:32 PM    ________________________________________________________________________  Velora Heckler GI MD note:  I personally examined the patient, reviewed the data and agree with the assessment and plan described above.  She almost certainly has radiation related esophagitis and possibly candida infection as well. She is clearly improving with time, IV PPI, IV diflucan. She is interested in full liquid trial and I will make that change now.   Owens Loffler, MD [facility] Pager [phone]

## 2020-02-02 DIAGNOSIS — R1110 Vomiting, unspecified: Secondary | ICD-10-CM

## 2020-02-02 DIAGNOSIS — R0789 Other chest pain: Secondary | ICD-10-CM

## 2020-02-02 LAB — GLUCOSE, CAPILLARY
Glucose-Capillary: 113 mg/dL — ABNORMAL HIGH (ref 70–99)
Glucose-Capillary: 117 mg/dL — ABNORMAL HIGH (ref 70–99)
Glucose-Capillary: 123 mg/dL — ABNORMAL HIGH (ref 70–99)
Glucose-Capillary: 126 mg/dL — ABNORMAL HIGH (ref 70–99)
Glucose-Capillary: 132 mg/dL — ABNORMAL HIGH (ref 70–99)
Glucose-Capillary: 156 mg/dL — ABNORMAL HIGH (ref 70–99)

## 2020-02-02 LAB — CBC WITH DIFFERENTIAL/PLATELET
Abs Immature Granulocytes: 0.03 10*3/uL (ref 0.00–0.07)
Basophils Absolute: 0 10*3/uL (ref 0.0–0.1)
Basophils Relative: 0 %
Eosinophils Absolute: 0.1 10*3/uL (ref 0.0–0.5)
Eosinophils Relative: 3 %
HCT: 27.4 % — ABNORMAL LOW (ref 36.0–46.0)
Hemoglobin: 8.7 g/dL — ABNORMAL LOW (ref 12.0–15.0)
Immature Granulocytes: 1 %
Lymphocytes Relative: 19 %
Lymphs Abs: 0.6 10*3/uL — ABNORMAL LOW (ref 0.7–4.0)
MCH: 29.5 pg (ref 26.0–34.0)
MCHC: 31.8 g/dL (ref 30.0–36.0)
MCV: 92.9 fL (ref 80.0–100.0)
Monocytes Absolute: 0.5 10*3/uL (ref 0.1–1.0)
Monocytes Relative: 14 %
Neutro Abs: 2 10*3/uL (ref 1.7–7.7)
Neutrophils Relative %: 63 %
Platelets: 95 10*3/uL — ABNORMAL LOW (ref 150–400)
RBC: 2.95 MIL/uL — ABNORMAL LOW (ref 3.87–5.11)
RDW: 18.5 % — ABNORMAL HIGH (ref 11.5–15.5)
WBC: 3.1 10*3/uL — ABNORMAL LOW (ref 4.0–10.5)
nRBC: 0 % (ref 0.0–0.2)

## 2020-02-02 LAB — COMPREHENSIVE METABOLIC PANEL
ALT: 20 U/L (ref 0–44)
AST: 27 U/L (ref 15–41)
Albumin: 2.6 g/dL — ABNORMAL LOW (ref 3.5–5.0)
Alkaline Phosphatase: 80 U/L (ref 38–126)
Anion gap: 5 (ref 5–15)
BUN: <5 mg/dL — ABNORMAL LOW (ref 6–20)
CO2: 25 mmol/L (ref 22–32)
Calcium: 7.1 mg/dL — ABNORMAL LOW (ref 8.9–10.3)
Chloride: 105 mmol/L (ref 98–111)
Creatinine, Ser: 0.47 mg/dL (ref 0.44–1.00)
GFR calc Af Amer: >60 mL/min (ref >60)
GFR calc non Af Amer: >60 mL/min (ref >60)
Glucose, Bld: 148 mg/dL — ABNORMAL HIGH (ref 70–99)
Potassium: 3.9 mmol/L (ref 3.5–5.1)
Sodium: 135 mmol/L (ref 135–145)
Total Bilirubin: 0.5 mg/dL (ref 0.3–1.2)
Total Protein: 6.5 g/dL (ref 6.5–8.1)

## 2020-02-02 LAB — PHOSPHORUS: Phosphorus: 2.5 mg/dL (ref 2.5–4.6)

## 2020-02-02 LAB — MAGNESIUM: Magnesium: 1.9 mg/dL (ref 1.7–2.4)

## 2020-02-02 MED ORDER — DEXTROSE-NACL 5-0.9 % IV SOLN: INTRAVENOUS | Status: DC

## 2020-02-02 MED ORDER — TRAVASOL 10 % IV SOLN
INTRAVENOUS | Status: DC
  Filled 2020-02-02: qty 960

## 2020-02-02 MED ORDER — INSULIN ASPART 100 UNIT/ML ~~LOC~~ SOLN
0.0000 [IU] | Freq: Four times a day (QID) | SUBCUTANEOUS | Status: DC
  Administered 2020-02-02: 1 [IU] via SUBCUTANEOUS
  Administered 2020-02-03 (×2): 2 [IU] via SUBCUTANEOUS

## 2020-02-02 MED ORDER — GUAIFENESIN-DM 100-10 MG/5ML PO SYRP
5.0000 mL | ORAL_SOLUTION | ORAL | Status: DC | PRN
  Administered 2020-02-02 (×3): 5 mL via ORAL
  Filled 2020-02-02 (×3): qty 10

## 2020-02-02 MED ORDER — PANTOPRAZOLE SODIUM 40 MG PO TBEC
40.0000 mg | DELAYED_RELEASE_TABLET | Freq: Two times a day (BID) | ORAL | Status: DC
  Administered 2020-02-02 – 2020-02-03 (×3): 40 mg via ORAL
  Filled 2020-02-02 (×2): qty 1

## 2020-02-02 NOTE — Progress Notes (Signed)
HEMATOLOGY-ONCOLOGY PROGRESS NOTE  SUBJECTIVE: The patient was seen with the assistance of the ASL renal interpreter.  She reports that she is feeling much better today.  She has been tolerating a clear liquid diet well.  She is not having much in terms of dysphagia or odynophagia.  Discussed with hospitalist who tells me that GI is planning to advance diet today.  They are hoping that they can advance her diet to the point that she will not need to go home with TPN.  Remains on high-dose PPI and fluconazole.  She has no complaints today.  Oncology History  Malignant neoplasm of lower-outer quadrant of left breast of female, estrogen receptor negative ([place])  05/11/2017 Initial Diagnosis   Left breast biopsy 3:30 position 6 cm from nipple: IDC with DCIS, lymphovascular invasion present, grade 2-3, ER 0%, PR 0%, HER-2 positive ratio 2.28, Ki-67 20%, 1 cm lesion left breast, T1b N0 stage IA clinical stage   06/09/2017 Surgery   Left lumpectomy: IDC grade 3, 1.2 cm, DCIS is present, margins negative, 0/6 lymph nodes negative, ER 0%, PR 0%, HER-2 positive ratio 2.28, Ki-67 20%, T1 BN 0 stage IA   07/09/2017 - 09/29/2017 Adjuvant Chemotherapy   Taxol/Herceptin x 12 completed on 09/29/2017, went on to receive every 3 week adjuvant Herceptin through 06/28/2018   11/25/2017 - 01/07/2018 Radiation Therapy    The patient initially received a dose of 50.4 Gy in 28 fractions to the breast using whole-breast tangent fields. This was delivered using a 3-D conformal technique. The patient then received a boost to the seroma. This delivered an additional 10 Gy in 5 fractions using a 3-D technique. The total dose was 60.4 Gy.   02/15/2018 Genetic Testing   The Common Hereditary Cancer Panel offered by [facility] includes sequencing and/or deletion duplication testing of the following 47 genes: APC, ATM, AXIN2, BARD1, BMPR1A, BRCA1, BRCA2, BRIP1, CDH1, CDKN2A (p14ARF), CDKN2A (p16INK4a), CKD4, CHEK2, CTNNA1, DICER1, EPCAM  (Deletion/duplication testing only), GREM1 (promoter region deletion/duplication testing only), KIT, MEN1, MLH1, MSH2, MSH3, MSH6, MUTYH, NBN, NF1, NHTL1, PALB2, PDGFRA, PMS2, POLD1, POLE, PTEN, RAD50, RAD51C, RAD51D, SDHB, SDHC, SDHD, SMAD4, SMARCA4. STK11, TP53, TSC1, TSC2, and VHL.  The following genes were evaluated for sequence changes only: SDHA and HOXB13 c.251G>A variant only.   Results: POSITIVE. 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 The date of this test report is 02/15/2018   10/20/2019 PET scan   Bone mets involving Right hip, Bil Iliac bones, T8, T9, L2 and L3, Rt prox humerus, Righ glenoid. Some lytic and some sclerotic. Mild Left supraclav adenopathy, 2 small lesions basal ganglia (could be lacunar infarcts)   11/14/2019 - 01/15/2020 Chemotherapy   The patient had palonosetron (ALOXI) injection 0.25 mg, 0.25 mg, Intravenous,  Once, 2 of 5 cycles Administration: 0.25 mg (12/05/2019), 0.25 mg (12/26/2019) ado-trastuzumab emtansine (KADCYLA) 260 mg in sodium chloride 0.9 % 250 mL chemo infusion, 3.6 mg/kg = 260 mg, Intravenous, Once, 3 of 6 cycles Administration: 260 mg (11/14/2019), 260 mg (12/05/2019), 260 mg (12/26/2019)  for chemotherapy treatment.    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 Some lytic and some sclerotic. Mild Left supraclav adenopathy, 2 small lesions basal ganglia  (could be lacunar infarcts)   11/14/2019 - 01/15/2020 Chemotherapy   The patient had palonosetron (ALOXI) injection 0.25 mg, 0.25 mg, Intravenous,  Once, 2 of 5 cycles Administration: 0.25 mg (12/05/2019), 0.25 mg (12/26/2019) ado-trastuzumab emtansine (KADCYLA) 260 mg in sodium chloride 0.9 % 250 mL chemo infusion, 3.6 mg/kg = 260 mg, Intravenous, Once, 3 of 6 cycles Administration: 260 mg (11/14/2019), 260 mg (12/05/2019), 260 mg (12/26/2019)  for chemotherapy treatment.       REVIEW OF SYSTEMS:   Constitutional: Denies fevers, chills  Respiratory: Denies cough, dyspnea or wheezes Cardiovascular: Denies palpitation, chest discomfort Gastrointestinal: Reports abdominal discomfort and difficulty swallowing.  No nausea or vomiting. Skin: Denies abnormal skin rashes Lymphatics: Denies new lymphadenopathy or easy bruising Neurological:Denies numbness, tingling or new weaknesses Behavioral/Psych: Mood is stable, no new changes  Extremities: No lower extremity edema All other systems were reviewed with the patient and are negative.  I have reviewed the past medical history, past surgical history, social history and family history with the patient and they are unchanged from previous note.   PHYSICAL EXAMINATION: ECOG PERFORMANCE STATUS: 2 - Symptomatic, <50% confined to bed  Vitals:   02/01/20 2134 02/02/20 0608  BP: 134/66 118/62  Pulse: 86 86  Resp: 18 17  Temp: 97.9 F (36.6 C) 98.2 F (36.8 C)  SpO2: 99% 98%   Filed Weights   01/30/20 0828  Weight: 61.4 kg    Intake/Output from previous day: 05/20 0701 - 05/21 0700 In: 757.3 [I.V.:661.9; IV Piggyback:95.4] Out: -   GENERAL:alert, no distress and comfortable SKIN: skin color, texture, turgor are normal, no rashes or significant lesions OROPHARYNX: White coating noted on tongue LUNGS: clear to auscultation and percussion with normal breathing effort HEART: regular rate & rhythm and no murmurs and no lower extremity  edema ABDOMEN:abdomen soft, non-tender and normal bowel sounds Musculoskeletal:no cyanosis of digits and no clubbing  NEURO: alert & oriented x 3, no focal motor/sensory deficits  LABORATORY DATA:  I have reviewed the data as listed CMP Latest Ref Rng & Units 02/02/2020 02/01/2020 01/31/2020  Glucose 70 - 99 mg/dL 148(H) 146(H) 162(H)  BUN 6 - 20 mg/dL <5(L) <5(L) <5(L)  Creatinine 0.44 - 1.00 mg/dL 0.47 0.41(L) 0.33(L)  Sodium 135 - 145 mmol/L 135 135 135  Potassium 3.5 - 5.1 mmol/L 3.9 3.1(L) 3.6  Chloride 98 - 111 mmol/L 105 103 103  CO2 22 - 32 mmol/L _0 Calcium 8.9 - 10.3 mg/dL 7.1(L) 6.9(L) 6.6(L)  Total Protein 6.5 - 8.1 g/dL 6.5 6.6 6.5  Total Bilirubin 0.3 - 1.2 mg/dL 0.5 0.4 0.4  Alkaline Phos 38 - 126 U/L 80 78 76  AST 15 - 41 U/L _1 ALT 0 - 44 U/L _2 Lab Results  Component Value Date   WBC 3.1 (L) 02/02/2020   HGB 8.7 (L) 02/02/2020   HCT 27.4 (L) 02/02/2020   MCV 92.9 02/02/2020   PLT 95 (L) 02/02/2020   NEUTROABS 2.0 02/02/2020    X-ray chest PA and lateral  Result Date: 01/29/2020 CLINICAL DATA:  Poor intake for 20 days with emesis EXAM: CHEST - 2 VIEW COMPARISON:  CT 01/01/2020 FINDINGS: No consolidation,  features of edema, pneumothorax, or effusion. The cardiomediastinal contours are unremarkable. Right IJ approach Port-A-Cath tip terminates in the mid SVC. Sclerotic lesions are seen in the T8 and T9 vertebrae. Additional known osseous metastases disease to the spine is poorly visualized on radiography. Sclerotic and lucent changes of the right glenoid may reflect additional site of metastatic disease. No other acute or worrisome osseous or soft tissue abnormality. IMPRESSION: 1. No acute cardiopulmonary abnormality. 2. Extensive osseous metastatic disease including the spine and right glenoid. Much of which is better visualized on comparison CT. Electronically Signed   By: Lovena Le M.D.   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 EXAM: MRI LUMBAR SPINE WITHOUT AND WITH CONTRAST TECHNIQUE: Multiplanar and multiecho pulse sequences of the lumbar spine were obtained without and with intravenous contrast. CONTRAST:  64m GADAVIST GADOBUTROL 1 MMOL/ML IV SOLN COMPARISON:  CT Abdomen and Pelvis 01/01/2020, PET-CT 10/19/2018. FINDINGS: Segmentation:  Normal on the comparison CT. Alignment: Stable lumbar lordosis from the recent CT. No significant spondylolisthesis. Vertebrae: Extensive visible spinal metastases. T10 small spinous process metastasis. Small metastases in the T11 body and right pedicle. 14 mm metastasis right T12 vertebral body abutting the pedicle. And T12 spinous process metastasis. Subtle metastasis suspected in the left L1 transverse process. Large 24 mm metastasis replacing most of the right L2 vertebral body. Some extension into the right pedicle. No definite extraosseous extension. Tumor replacing most of the L3 vertebra including the entire body, left pedicle and left posterior elements. Pathologic compression fracture with 30% loss of vertebral body height. Broad-based extension of tumor into the ventral epidural space and also the left L3 neural foramen, with additional early extraosseous tumor along the left lateral vertebral margin. See series 16, image 16. Multifocal tumor replacing about half of the L4 vertebral body primarily to the left of midline. Left pedicle is involved. No definite extraosseous extension at this time. Small 15 mm posterosuperior L5 vertebral body metastasis. Partially visible right lateral sacral ala and medial right iliac bone metastases. There is also partially visible paracentral S3 level tumor on the right. Conus medullaris and cauda equina: Conus extends to the L1 level. No lower spinal cord or conus  signal abnormality. No definite abnormal intradural enhancement or dural thickening; there is mild heterogeneity of the ventral cauda equina nerve roots at L1 on axial postcontrast, but this is not correlated on the sagittal. Paraspinal and other soft tissues: Visible abdominal and pelvic viscera are stable from the CT earlier this month. There is mild edema in the left paraspinal muscles at L3-L4 likely related to the L3 extraosseous extension of tumor (series 6, image 15). Disc levels: Mild for age superimposed lumbar spine degeneration. Related to the epidural and extraosseous tumor at L3 there is moderate spinal stenosis, moderate lateral recess stenosis greater on the left, and severe left L3 foraminal stenosis. IMPRESSION: 1. Extensive metastatic disease to the visible spine. 2. Tumor replacement of the L3 vertebral body with pathologic compression fracture, epidural and extraosseous extension. Subsequent moderate spinal and lateral recess stenosis, and tumor throughout the left L3 foramen. 3. No other pathologic fracture, definite epidural or extraosseous disease. No definite intrathecal metastasis at this time. Electronically Signed   By: HGenevie AnnM.D.   On: 01/07/2020 07:41   MR HIP RIGHT W WO CONTRAST  Result  Date: 01/08/2020 CLINICAL DATA:  Metastatic breast cancer. Right hip pain. EXAM: MRI OF THE RIGHT HIP WITHOUT AND WITH CONTRAST TECHNIQUE: Multiplanar, multisequence MR imaging was performed both before and after administration of intravenous contrast. CONTRAST:  79m GADAVIST GADOBUTROL 1 MMOL/ML IV SOLN COMPARISON:  CT scan of the abdomen and pelvis dated 01/01/2020 and PET-CT dated 10/20/2019 FINDINGS: Bones: There are numerous metastatic lesions throughout the bones of the pelvis, much more extensive in numerous than appreciable on the prior PET-CT or CT scan listed above. There is a pathologic vertical linear fracture through the right ilium adjacent to the right SI joint without displacement.  There is a 3.2 cm metastasis in the posterior aspect of the right greater trochanter with destruction of the cortex at the insertion of the obturator externus tendon. There is a small potassium cysts in the superior aspect of the right femoral head. There are multiple metastases in the L4 and L5 vertebral bodies as well as in the sacrum. There is a metastasis in the left femoral neck. There is extensive metastatic disease involving the majority of the left acetabulum and to a lesser degree of the right acetabulum. Articular cartilage and labrum Articular cartilage:  Normal. Labrum:  Intact. Joint or bursal effusion Joint effusion:  No effusion. Bursae: No bursitis. Muscles and tendons Muscles and tendons: Edema around the operator tendons at the insertion on the posterior aspect of the right greater trochanter secondary to destructive metastasis at that site. There is edema in the adjacent soft tissues due to the metastasis. This involves the insertion of gluteus medius tendon. Other findings Miscellaneous:   None IMPRESSION: 1. Extensive metastatic disease throughout the bones of the pelvis, much more extensive than appreciable on the prior PET-CT or CT scan listed above. 2. Pathologic vertical fracture through the right ilium adjacent to the right SI joint. Destruction of the posterior cortex of the right greater trochanter. 3. Metastatic disease in the left femoral neck and sacrum. Electronically Signed   By: JLorriane ShireM.D.   On: 01/08/2020 08:18   [place] SITE RITE  Result Date: 01/30/2020 If Site Rite image not attached, placement could not be confirmed due to current cardiac rhythm.  DG ESOPHAGUS W SINGLE CM (SOL OR THIN BA)  Result Date: 02/01/2020 CLINICAL DATA:  Vomiting. Burning in chest. Rule out esophagitis. Breast cancer. EXAM: ESOPHOGRAM/BARIUM SWALLOW TECHNIQUE: Single contrast examination was performed using  Gastrografin. FLUOROSCOPY TIME:  Fluoroscopy Time:  1 minutes 24 seconds  Radiation Exposure Index (if provided by the fluoroscopic device): Number of Acquired Spot Images: 0 COMPARISON:  CT chest 01/01/2020 FINDINGS: Patient is deaf. Sign language interpreter assisted with communication. Patient swallowed Gastrografin in the upright position. Esophageal mucosa and motility normal. No esophageal thickening. No ulcer or mass. Negative for stricture. Small hiatal hernia. Barium tablet passed readily into the stomach without delay IMPRESSION: Negative for soft a geode stricture or mass. No mucosal edema or ulceration. Small hiatal hernia Barium tablet passed without delay. Electronically Signed   By: CFranchot GalloM.D.   On: 02/01/2020 12:56    ASSESSMENT AND PLAN: 1.  Metastatic breast cancer 2.  Severe dysphagia secondary to radiation therapy 3.  Esophageal candidiasis 4.  Hypokalemia, improved 5.  Type 2 diabetes mellitus  -The patient was scheduled to begin talazoparib on 01/29/2020.  Due to severe dysphagia, this medication has not yet been started.  If she is able to swallow pills, she can probably resume this when she is discharged home.  I advised her to let our office no if she has any difficulty swallowing these pills. -Continue TPN for now.  Advance diet per GI and hospitalist. -We will arrange for outpatient follow-up in our office next week to recheck her labs and see how she is tolerating the talazoparib.    LOS: 4 days   Mikey Bussing, DNP, AGPCNP-BC, AOCNP 02/02/20

## 2020-02-02 NOTE — Progress Notes (Signed)
PROGRESS NOTE    Erin [name]  [id number] DOB: 10/09/1960 DOA: 01/29/2020 PCP: Erin Blackbird, MD   Chief Complaint  Patient presents with   Abdominal Pain   Emesis   Brief Narrative:  89 year oldBFPMHxDM type II controlled without complication, breast cancer,metastatic fallopian tube carcinoma,with metastasis to bone/spinethat is post TAH/BSO, S/Pchemotherapy and radiationis here for follow-up visit accompanied by her mother and sign language interpreter for dysphagia and odynophagia Most recent palliative radiation to spinal cord April 20 to Jan 19, 2020  She is havingsignificant difficulty swallowing post radiation, receiving IV fluids dailyat cancer center. She is only drinking alkaline water but otherwise has not been able to eat anything. Per her mother she does not like to drink any sugary juices, boost breeze, Gatorade, Pedialyte, Jell-O or even popsicles. States they are too sweet and makes her nauseous. She has not tried any broth or clear soups.  Her chest hurts when she tries to eat anything and sometimes even with saliva,she feels the chest hurts all the time. No significant improvement with Carafate tablets, she dissolves the tablets prior to drinking it as [name] slurry.  Oncologic history positive for breast cancer s/p lumpectomy, adjuvant chemotherapy and radiation therapy  Colonoscopy November 02, 2017: Removal of 2 small sessile polyps [tubular adenoma X1 and hyperplastic polypX1], internal hemorrhoids otherwise normal exam   Assessment & Plan:   Active Problems:   Hearing loss   DM type 2 (diabetes mellitus, type 2) (HCC)   Fallopian tube cancer, carcinoma, right (HCC)   Bone metastases (HCC)   Dysphagia   Radiation-induced esophagitis   Esophageal candidiasis (HCC)   Burning in the chest   Vomiting  Radiation Induced Esophagitis   Presumed Esophageal Candidiasis  -GI c/s, appreciate assistance -> recommending empiric treatment for  possible esophageal candidiasis, high dose PPI, magic mouthwash, antiemetics prn - planning to hold off on EGD for now - consider d/c this weekend if doing well with PO - Esophagram negative for geode stricture or mass, no mucosal edema or ulceration - Transition protonix to PO, magic mouthwash, prn antiemetics - if able to tolerate PO, maybe able to avoid TPN at discharge  Metastatic Breast Cancer with Bone Metastases   FallopianTube Cancer:  Followed by Dr. [name] Plan to start talazoparib, but has been unable to be started due to severe dysphagia Plan to attempt to resume when she's discharged home  Protein calorie malnutrition  -D5-0.9% saline 57ml/hr -currently on TPN -> if she's doing well from PO standpoint, possibly can d/c this at discharge  Electrolyte Abnormalities   Hypokalemia   Hypomagnesemia   Hypophosphatemia - replace and follow (per pharmacy) - follow electrolytes daily  DM type II controlled without complication -0000000 -A1c 5.8 -Lipid panel -> HDL 18 -Sensitive SSI  Deaf -Stable  Thrombocytopenia: continue to monitor  DVT prophylaxis: lovenox Code Status: full  Family Communication: mother at bedside Disposition:   Status is: Inpatient  Remains inpatient appropriate because:Inpatient level of care appropriate due to severity of illness   Dispo: The patient is from: Home              Anticipated d/c is to: pending              Anticipated d/c date is: > 3 days              Patient currently is not medically stable to d/c.    Consultants:   oncology  Procedures:  PICC placement 5/18  Antimicrobials:  Anti-infectives (From  admission, onward)   Start     Dose/Rate Route Frequency Ordered Stop   02/02/20 1000  fluconazole (DIFLUCAN) IVPB 200 mg     200 mg 100 mL/hr over 60 Minutes Intravenous Every 24 hours 02/01/20 0938     02/01/20 1030  fluconazole (DIFLUCAN) IVPB 400 mg     400 mg 100 mL/hr over 120 Minutes  Intravenous  Once 02/01/20 0938 02/01/20 1340   01/31/20 1700  fluconazole (DIFLUCAN) IVPB 100 mg  Status:  Discontinued     100 mg 50 mL/hr over 60 Minutes Intravenous Daily-1600 01/31/20 1534 02/01/20 0938   01/30/20 1530  fluconazole (DIFLUCAN) 40 MG/ML suspension 100 mg  Status:  Discontinued     100 mg Oral Daily 01/30/20 1354 01/31/20 1534      Subjective: Continues to feel better  Objective: Vitals:   02/01/20 1255 02/01/20 2134 02/02/20 0608 02/02/20 1213  BP: 137/70 134/66 118/62 126/65  Pulse: 88 86 86 94  Resp: 20 18 17 20   Temp: 98.5 F (36.9 C) 97.9 F (36.6 C) 98.2 F (36.8 C) 98.3 F (36.8 C)  TempSrc: Oral Oral Oral Oral  SpO2: 96% 99% 98% 100%  Weight:      Height:        Intake/Output Summary (Last 24 hours) at 02/02/2020 1717 Last data filed at 02/01/2020 1748 Gross per 24 hour  Intake 496.6 ml  Output --  Net 496.6 ml   Filed Weights   01/30/20 0828  Weight: 61.4 kg    Examination:  General: No acute distress. Cardiovascular: Heart sounds show [name] regular rate, and rhythm Lungs: Clear to auscultation bilaterally  Abdomen: Soft, nontender, nondistended  Neurological: Alert and oriented 3. Moves all extremities 4. Cranial nerves II through XII grossly intact. Skin: Warm and dry. No rashes or lesions. Extremities: No clubbing or cyanosis. No edema  Data Reviewed: I have personally reviewed following labs and imaging studies  CBC: Recent Labs  Lab 01/29/20 2122 01/30/20 0520 01/31/20 0614 02/01/20 0408 02/02/20 0541  WBC 4.8 4.5 4.3 3.5* 3.1*  NEUTROABS  --  3.1 3.1 2.3 2.0  HGB 10.3* 9.3* 9.1* 8.7* 8.7*  HCT 31.4* 28.6* 27.6* 27.7* 27.4*  MCV 90.5 90.8 91.4 92.0 92.9  PLT 100* 105* 97* 94* 95*    Basic Metabolic Panel: Recent Labs  Lab 01/29/20 2122 01/29/20 2122 01/30/20 0520 01/30/20 0520 01/30/20 0813 01/30/20 1740 01/31/20 0614 02/01/20 0408 02/02/20 0541  NA 135  --  139  --   --   --  135 135 135  K 3.3*   < > 3.3*    < > 4.0 3.2* 3.6 3.1* 3.9  CL 104  --  106  --   --   --  103 103 105  CO2 19*  --  21*  --   --   --  25 25 25   GLUCOSE 83  --  129*  --   --   --  162* 146* 148*  BUN 5*  --  <5*  --   --   --  <5* <5* <5*  CREATININE 0.40*  --  0.45  --   --   --  0.33* 0.41* 0.47  CALCIUM 6.8*  --  7.1*  --   --   --  6.6* 6.9* 7.1*  MG 1.8   < > 2.0  --   --  1.7 1.6* 1.9 1.9  PHOS  --   --  <1.0*  --   --  1.4* 2.3* 1.6* 2.5   < > = values in this interval not displayed.    GFR: Estimated Creatinine Clearance: 62.6 mL/min (by C-G formula based on SCr of 0.47 mg/dL).  Liver Function Tests: Recent Labs  Lab 01/29/20 1606 01/29/20 1606 01/29/20 2122 01/30/20 0520 01/31/20 0614 02/01/20 0408 02/02/20 0541  AST 23  --   --  21 19 22 27   ALT 16  --   --  14 14 15 20   ALKPHOS 85  --   --  76 76 78 80  BILITOT 0.9  --   --  0.5 0.4 0.4 0.5  PROT 8.1  --   --  7.1 6.5 6.6 6.5  ALBUMIN 3.3*   < > 2.7* 2.9* 2.6* 2.6* 2.6*   < > = values in this interval not displayed.    CBG: Recent Labs  Lab 02/02/20 0019 02/02/20 0412 02/02/20 0754 02/02/20 1209 02/02/20 1549  GLUCAP 132* 126* 113* 117* 123*     Recent Results (from the past 240 hour(s))  SARS Coronavirus 2 by RT PCR (hospital order, performed in [facility] hospital lab) Nasopharyngeal Nasopharyngeal Swab     Status: None   Collection Time: 01/29/20  5:26 PM   Specimen: Nasopharyngeal Swab  Result Value Ref Range Status   SARS Coronavirus 2 NEGATIVE NEGATIVE Final    Comment: (NOTE) SARS-CoV-2 target nucleic acids are NOT DETECTED. The SARS-CoV-2 RNA is generally detectable in upper and lower respiratory specimens during the acute phase of infection. The lowest concentration of SARS-CoV-2 viral copies this assay can detect is 250 copies / mL. Zeyad Delaguila negative result does not preclude SARS-CoV-2 infection and should not be used as the sole basis for treatment or other patient management decisions.  Leata Dominy negative result may occur  with improper specimen collection / handling, submission of specimen other than nasopharyngeal swab, presence of viral mutation(s) within the areas targeted by this assay, and inadequate number of viral copies (<250 copies / mL). Karo Rog negative result must be combined with clinical observations, patient history, and epidemiological information. Fact Sheet for Patients:   StrictlyIdeas.no Fact Sheet for Healthcare Providers: BankingDealers.co.za This test is not yet approved or cleared  by the [place] FDA and has been authorized for detection and/or diagnosis of SARS-CoV-2 by FDA under an Emergency Use Authorization (EUA).  This EUA will remain in effect (meaning this test can be used) for the duration of the COVID-19 declaration under Section 564(b)(1) of the Act, 21 U.S.C. section 360bbb-3(b)(1), unless the authorization is terminated or revoked sooner. Performed at [facility], [place] [address] [place], [place] [address]          Radiology Studies: DG ESOPHAGUS W SINGLE CM (SOL OR THIN BA)  Result Date: 02/01/2020 CLINICAL DATA:  Vomiting. Burning in chest. Rule out esophagitis. Breast cancer. EXAM: ESOPHOGRAM/BARIUM SWALLOW TECHNIQUE: Single contrast examination was performed using  Gastrografin. FLUOROSCOPY TIME:  Fluoroscopy Time:  1 minutes 24 seconds Radiation Exposure Index (if provided by the fluoroscopic device): Number of Acquired Spot Images: 0 COMPARISON:  CT chest 01/01/2020 FINDINGS: Patient is deaf. Sign language interpreter assisted with communication. Patient swallowed Gastrografin in the upright position. Esophageal mucosa and motility normal. No esophageal thickening. No ulcer or mass. Negative for stricture. Small hiatal hernia. Barium tablet passed readily into the stomach without delay IMPRESSION: Negative for soft Kamaile Zachow geode stricture or mass. No mucosal edema or ulceration. Small hiatal hernia Barium  tablet passed without delay. Electronically Signed  By: Franchot Gallo M.D.   On: 02/01/2020 12:56        Scheduled Meds:  Chlorhexidine Gluconate Cloth  6 each Topical Daily   Chlorhexidine Gluconate Cloth  6 each Topical Daily   diatrizoate meglumine-sodium  60 mL Oral Once   enoxaparin (LOVENOX) injection  40 mg Subcutaneous Q24H   insulin aspart  0-9 Units Subcutaneous Q6H   magic mouthwash  10 mL Oral TID   pantoprazole  40 mg Oral BID   Continuous Infusions:  dextrose 5 % and 0.9% NaCl 45 mL/hr at 02/01/20 0859   dextrose 5 % and 0.9% NaCl     fluconazole (DIFLUCAN) IV 200 mg (02/02/20 1045)   methocarbamol (ROBAXIN) IV     TPN ADULT (ION) 40 mL/hr at 02/01/20 1748   TPN ADULT (ION)       LOS: 4 days    Time spent: over 30 min    Fayrene Helper, MD [facility]   To contact the attending provider between 7A-7P or the covering provider during after hours 7P-7A, please log into the web site [email or web address] and access using universal [place] password for that web site. If you do not have the password, please call the hospital operator.  02/02/2020, 5:17 PM

## 2020-02-02 NOTE — Progress Notes (Addendum)
PHARMACY - TOTAL PARENTERAL NUTRITION CONSULT NOTE   Indication: Severe dysphagia secondary to radiation therapy, declined PEG tube  Patient Measurements: Height: 4\' 11"  (149.9 cm) Weight: 61.4 kg (135 lb 6 oz) IBW/kg (Calculated) : 43.2 TPN AdjBW (KG): 47.8 Body mass index is 27.34 kg/m. Usual Weight: ~ 70 kg  Sodium  Date Value  02/02/2020 135 mmol/L  04/06/2019 143 mmol/L  09/15/2017 139 mEq/L   Potassium  Date Value  02/02/2020 3.9 mmol/L  09/15/2017 3.8 mEq/L   Phosphorus (mg/dL)  Date Value  02/02/2020 2.5   Magnesium (mg/dL)  Date Value  02/02/2020 1.9   Calcium (mg/dL)  Date Value  02/02/2020 7.1 (L)  09/15/2017 9.2   Albumin (g/dL)  Date Value  02/02/2020 2.6 (L)  04/06/2019 4.3  09/15/2017 3.3 (L)   CBG (last 3)  Recent Labs    02/02/20 0019 02/02/20 0412 02/02/20 0754  GLUCAP 132* 126* 113*    Assessment: 59 y/o F with a h/o metastatic breast cancer, DM, and esophageal candidiasis admitted to initiate TPN therapy due to prolonged malnutrition from severe dysphagia related to radiation therapy. Patient declined PEG tube placement.   Glucose / Insulin: 24h: CBGs 113-148, 4 units SSI PTA: glimepiride and metformin/ no insulin Electrolytes: Lytes WNL, Corr Ca: 8.2 Renal: SCr < 1, stable LFTs / TGs: LFTs nl, TG 93 Prealbumin / albumin: 8.1 (5/19)/2.6  Intake / Output; MIVF: not adhering to strict I/Os? D5NS @ 45 cc/hr GI Imaging: Surgeries / Procedures:   Central access: 5/18 TPN start date:  5/19  Nutritional Goals (per RD recommendation on 5/19): kCal: 1800-2000, Protein: 90-100, Fluid: 2L/day Goal TPN rate is 80 mL/hr (provides 96 g of protein and 1939 kcals per day)  Current Nutrition:  TPN @ 40 ml/hr providing 637 kcal and 28.8 g protein +D5NS @ 45 cc/hr  Plan:  At 1800: Advance TPN to goal TPN at 80 mL/hr at 1800 Electrolytes in TPN: 66mEq/L of Na, inc to  65mEq/L of K, 30mEq/L of Ca, 7.73mEq/L of Mg, and  67mmol/L of Phos. Cl:Ac  ratio 1:1 Add standard MVI and trace elements to TPN Reduce CBGs to q 6h, continue sSSI and adjust as needed Reduce MIVF to KVO mL/hr at 1800 CMET, Mg, and phos in AM Monitor TPN labs on Mon/Thurs F/U tolerance of FLD  Ulice Dash D 02/02/2020,8:38 AM

## 2020-02-02 NOTE — Progress Notes (Signed)
Progress Note   Subjective  Patient doing much better at this AM. Mother at bedside, she is swallowing clear liquids and wants to try full liquids.    Objective   Vital signs in last 24 hours: Temp:  [97.9 F (36.6 C)-98.5 F (36.9 C)] 98.2 F (36.8 C) (05/21 [id number]) Pulse Rate:  [86-88] 86 (05/21 0608) Resp:  [17-20] 17 (05/21 [id number]) BP: (118-137)/(62-70) 118/62 (05/21 0608) SpO2:  [96 %-99 %] 98 % (05/21 [id number]) Last BM Date: (patient can't remember ) General:    AA female in NAD Abdomen:  Soft, nontender and nondistended.  Extremities:  Without edema. Neurologic:  Alert and oriented,  grossly normal neurologically. Psych:  Cooperative. Normal mood and affect.  Intake/Output from previous day: 05/20 0701 - 05/21 0700 In: 757.3 [I.V.:661.9; IV Piggyback:95.4] Out: -  Intake/Output this shift: No intake/output data recorded.  Lab Results: Recent Labs    01/31/20 0614 02/01/20 0408 02/02/20 0541  WBC 4.3 3.5* 3.1*  HGB 9.1* 8.7* 8.7*  HCT 27.6* 27.7* 27.4*  PLT 97* 94* 95*   BMET Recent Labs    01/31/20 0614 02/01/20 0408 02/02/20 0541  NA 135 135 135  K 3.6 3.1* 3.9  CL 103 103 105  CO2 25 25 25   GLUCOSE 162* 146* 148*  BUN <5* <5* <5*  CREATININE 0.33* 0.41* 0.47  CALCIUM 6.6* 6.9* 7.1*   LFT Recent Labs    02/02/20 0541  PROT 6.5  ALBUMIN 2.6*  AST 27  ALT 20  ALKPHOS 80  BILITOT 0.5   PT/INR No results for input(s): LABPROT, INR in the last 72 hours.  Studies/Results: DG ESOPHAGUS W SINGLE CM (SOL OR THIN BA)  Result Date: 02/01/2020 CLINICAL DATA:  Vomiting. Burning in chest. Rule out esophagitis. Breast cancer. EXAM: ESOPHOGRAM/BARIUM SWALLOW TECHNIQUE: Single contrast examination was performed using  Gastrografin. FLUOROSCOPY TIME:  Fluoroscopy Time:  1 minutes 24 seconds Radiation Exposure Index (if provided by the fluoroscopic device): Number of Acquired Spot Images: 0 COMPARISON:  CT chest 01/01/2020 FINDINGS: Patient is deaf. Sign  language interpreter assisted with communication. Patient swallowed Gastrografin in the upright position. Esophageal mucosa and motility normal. No esophageal thickening. No ulcer or mass. Negative for stricture. Small hiatal hernia. Barium tablet passed readily into the stomach without delay IMPRESSION: Negative for soft a geode stricture or mass. No mucosal edema or ulceration. Small hiatal hernia Barium tablet passed without delay. Electronically Signed   By: Franchot Gallo M.D.   On: 02/01/2020 12:56       Assessment / Plan:   59 y/o female with metastatic breast cancer with bone mets and fallopian tube cancer status post chemotherapy and radiation, presenting with worsening nausea vomiting, burning sensation in the chest.  Barium swallow is negative, no obvious stricture or significant mucosal abnormalities.  She has been treated for esophageal candidiasis empirically as well as radiation esophagitis with high-dose PPI, antiemetics, Magic mouthwash and she is considerably better - tolerating clear liquids and wants to advance diet. She has been on TNA recently however she may not need this moving forward if she continues to tolerate p.o. well over the next 24 hours.  Would advance to full liquid diet today as tolerated, and then soft.  Continue full course of empiric treatment for possible esophageal candidiasis with Diflucan for 2 weeks, continue high-dose PPI (BID dosing), Magic mouthwash, and antiemetics as needed.  I do not think EGD is likely to reveal a different diagnosis than what is  otherwise suspected and will hold off on that for now, patient wanted to hold off on invasive procedures if possible.  If she can tolerate full liquids on oral regimen she may be able to go home tomorrow or this weekend. Of note she has previously declined PEG, hopefully she continues to do well with the regimen as outlined.   Will sign off for now, please call with questions moving forward.  [place] Cellar,  MD [facility]

## 2020-02-03 LAB — CBC WITH DIFFERENTIAL/PLATELET
Abs Immature Granulocytes: 0.04 10*3/uL (ref 0.00–0.07)
Basophils Absolute: 0 10*3/uL (ref 0.0–0.1)
Basophils Relative: 0 %
Eosinophils Absolute: 0.1 10*3/uL (ref 0.0–0.5)
Eosinophils Relative: 3 %
HCT: 28.9 % — ABNORMAL LOW (ref 36.0–46.0)
Hemoglobin: 9.2 g/dL — ABNORMAL LOW (ref 12.0–15.0)
Immature Granulocytes: 1 %
Lymphocytes Relative: 21 %
Lymphs Abs: 0.7 10*3/uL (ref 0.7–4.0)
MCH: 30 pg (ref 26.0–34.0)
MCHC: 31.8 g/dL (ref 30.0–36.0)
MCV: 94.1 fL (ref 80.0–100.0)
Monocytes Absolute: 0.6 10*3/uL (ref 0.1–1.0)
Monocytes Relative: 16 %
Neutro Abs: 2 10*3/uL (ref 1.7–7.7)
Neutrophils Relative %: 59 %
Platelets: 104 10*3/uL — ABNORMAL LOW (ref 150–400)
RBC: 3.07 MIL/uL — ABNORMAL LOW (ref 3.87–5.11)
RDW: 18.6 % — ABNORMAL HIGH (ref 11.5–15.5)
WBC: 3.4 10*3/uL — ABNORMAL LOW (ref 4.0–10.5)
nRBC: 0 % (ref 0.0–0.2)

## 2020-02-03 LAB — COMPREHENSIVE METABOLIC PANEL
ALT: 30 U/L (ref 0–44)
AST: 33 U/L (ref 15–41)
Albumin: 2.7 g/dL — ABNORMAL LOW (ref 3.5–5.0)
Alkaline Phosphatase: 92 U/L (ref 38–126)
Anion gap: 6 (ref 5–15)
BUN: 10 mg/dL (ref 6–20)
CO2: 26 mmol/L (ref 22–32)
Calcium: 8.2 mg/dL — ABNORMAL LOW (ref 8.9–10.3)
Chloride: 105 mmol/L (ref 98–111)
Creatinine, Ser: 0.49 mg/dL (ref 0.44–1.00)
GFR calc Af Amer: >60 mL/min (ref >60)
GFR calc non Af Amer: >60 mL/min (ref >60)
Glucose, Bld: 166 mg/dL — ABNORMAL HIGH (ref 70–99)
Potassium: 5 mmol/L (ref 3.5–5.1)
Sodium: 137 mmol/L (ref 135–145)
Total Bilirubin: 0.3 mg/dL (ref 0.3–1.2)
Total Protein: 6.9 g/dL (ref 6.5–8.1)

## 2020-02-03 LAB — GLUCOSE, CAPILLARY
Glucose-Capillary: 153 mg/dL — ABNORMAL HIGH (ref 70–99)
Glucose-Capillary: 132 mg/dL — ABNORMAL HIGH (ref 70–99)

## 2020-02-03 LAB — PHOSPHORUS: Phosphorus: 3.1 mg/dL (ref 2.5–4.6)

## 2020-02-03 LAB — MAGNESIUM: Magnesium: 2 mg/dL (ref 1.7–2.4)

## 2020-02-03 MED ORDER — TRAVASOL 10 % IV SOLN
INTRAVENOUS | Status: DC
  Filled 2020-02-03: qty 960

## 2020-02-03 MED ORDER — METFORMIN HCL 500 MG PO TABS: 1000.0000 mg | ORAL_TABLET | Freq: Two times a day (BID) | ORAL | 1 refills | Status: DC

## 2020-02-03 MED ORDER — PANTOPRAZOLE SODIUM 40 MG PO TBEC: 40.0000 mg | DELAYED_RELEASE_TABLET | Freq: Two times a day (BID) | ORAL | 1 refills | Status: DC

## 2020-02-03 MED ORDER — HEPARIN SOD (PORK) LOCK FLUSH 100 UNIT/ML IV SOLN
500.0000 [IU] | Freq: Once | INTRAVENOUS | Status: AC
  Administered 2020-02-03: 500 [IU] via INTRAVENOUS
  Filled 2020-02-03: qty 5

## 2020-02-03 MED ORDER — FLUCONAZOLE 10 MG/ML PO SUSR: 200.0000 mg | Freq: Every day | ORAL | 0 refills | Status: AC

## 2020-02-03 MED ORDER — MAGIC MOUTHWASH: 10.0000 mL | Freq: Three times a day (TID) | ORAL | 0 refills | Status: AC | PRN

## 2020-02-03 NOTE — Progress Notes (Signed)
PHARMACY - TOTAL PARENTERAL NUTRITION CONSULT NOTE   Indication: Severe dysphagia secondary to radiation therapy, declined PEG tube  Patient Measurements: Height: 4\' 11"  (149.9 cm) Weight: 61.4 kg (135 lb 6 oz) IBW/kg (Calculated) : 43.2 TPN AdjBW (KG): 47.8 Body mass index is 27.34 kg/m. Usual Weight: ~ 70 kg  Sodium  Date Value  02/03/2020 137 mmol/L  04/06/2019 143 mmol/L  09/15/2017 139 mEq/L   Potassium  Date Value  02/03/2020 5.0 mmol/L  09/15/2017 3.8 mEq/L   Phosphorus (mg/dL)  Date Value  02/03/2020 3.1   Magnesium (mg/dL)  Date Value  02/03/2020 2.0   Calcium (mg/dL)  Date Value  02/03/2020 8.2 (L)  09/15/2017 9.2   Albumin (g/dL)  Date Value  02/03/2020 2.7 (L)  04/06/2019 4.3  09/15/2017 3.3 (L)   CBG (last 3)  Recent Labs    02/02/20 1549 02/02/20 2357 02/03/20 0540  GLUCAP 123* 156* 153*    Assessment: 59 y/o F with a h/o metastatic breast cancer, DM, and esophageal candidiasis admitted to initiate TPN therapy due to prolonged malnutrition from severe dysphagia related to radiation therapy. Patient declined PEG tube placement.   Glucose / Insulin: 24h: CBGs 117-166, 5 units SSI PTA: glimepiride and metformin/ no insulin Electrolytes: Lytes WNL, Corr Ca: 9.2 Renal: SCr < 1, stable LFTs / TGs: LFTs nl, TG 93 Prealbumin / albumin: 8.1 (5/19)/2.6  Intake / Output; MIVF: not adhering to strict I/Os? D5NS @ 10 cc/hr GI Imaging: Surgeries / Procedures:   Central access: 5/18 TPN start date:  5/19  Nutritional Goals (per RD recommendation on 5/19): kCal: 1800-2000, Protein: 90-100, Fluid: 2L/day Goal TPN rate is 80 mL/hr (provides 96 g of protein and 1939 kcals per day)  Current Nutrition:  TPN @ 80 ml/hr providing 637 kcal and 28.8 g protein +D5NS @ 45 cc/hr  Plan:  At 1800: Continue TPN at 80 mL/hr at 1800 (goal) Electrolytes in TPN: 43mEq/L of Na, inc to  79mEq/L of K, 53mEq/L of Ca, 7.11mEq/L of Mg, and  66mmol/L of Phos. Cl:Ac  ratio 1:1 Add standard MVI and trace elements to TPN continue CBGs to q6h, continue sSSI and adjust as needed continue MIVF to KVO mL/hr at 1800 BMET, Mg, and phos in AM Monitor TPN labs on Mon/Thurs F/U tolerance of FLD  Dolly Rias RPh 02/03/2020, 10:41 AM

## 2020-02-03 NOTE — Discharge Summary (Addendum)
Physician Discharge Summary  Erin [name] TUU:[id number] DOB: 11/06/1960 DOA: 01/29/2020  PCP: Erin [name]  Admit date: 01/29/2020 Discharge date: 02/03/2020  Time spent: 40 minutes  Recommendations for Outpatient Follow-up:  1. Follow outpatient CBC/CMP 2. Follow tolerance of PO with medical management of esophagitis  3. She's to start talazoparib at discharge 4. Follow with oncology outpatient   Discharge Diagnoses:  Active Problems:   Hearing loss   DM type 2 (diabetes mellitus, type 2) (HCC)   Fallopian tube cancer, carcinoma, right (HCC)   Bone metastases (HCC)   Dysphagia   Radiation-induced esophagitis   Esophageal candidiasis (HCC)   Burning in the chest   Vomiting  Discharge Condition: stable  Diet recommendation: soft diet  Filed Weights   01/30/20 0828  Weight: 61.4 kg    History of present illness:  59 year oldBFPMHxDM type II controlled without complication, breast cancer,metastatic fallopian tube carcinoma,with metastasis to bone/spinethat is post TAH/BSO, S/Pchemotherapy and radiationis here for follow-up visit accompanied by her mother and sign language interpreter for dysphagia and odynophagia Most recent palliative radiation to spinal cord April 20 to Jan 19, 2020  She is havingsignificant difficulty swallowing post radiation, receiving IV fluids dailyat cancer center. She is only drinking alkaline water but otherwise has not been able to eat anything. Per her mother she does not like to drink any sugary juices, boost breeze, Gatorade, Pedialyte, Jell-O or even popsicles. States they are too sweet and makes her nauseous. She has not tried any broth or clear soups.  Her chest hurts when she tries to eat anything and sometimes even with saliva,she feels the chest hurts all the time. No significant improvement with Carafate tablets, she dissolves the tablets prior to drinking it as Erin [name] slurry.  Oncologic history positive for breast  cancer s/p lumpectomy, adjuvant chemotherapy and radiation therapy  Colonoscopy November 02, 2017: Removal of 2 small sessile polyps [tubular adenoma X1 and hyperplastic polypX1], internal hemorrhoids otherwise normal exam  She was admitted with dysphagia/odynophagia in the setting of radiation esophagitis and presumed esophageal candidiasis.  She's improved with IV PPI, fluconazole, magic mouthwash and antiemetics.  Initially received TPN, but she was tolerating diet on the day of discharge and this was discontinued at discharge.  Hospital Course:  Radiation Induced Esophagitis  Presumed Esophageal Candidiasis  -GI c/s, appreciate assistance -> recommending empiric treatment for possible esophageal candidiasis, high dose PPI, magic mouthwash, antiemetics prn - planning to hold off on EGD for now  - Esophagram negative for geode stricture or mass, no mucosal edema or ulceration - Transition protonix to PO, magic mouthwash, prn antiemetics, continue PO fluconazole (QTc appropriate today prior to discharge - some issues with EKG being transmitted/printed, QTc~450) - she's doing well with PO today, will d/c without TPN, discontinue picc  Metastatic Breast Cancer with Bone Metastases  FallopianTube Cancer:  Followed by Dr. Lindi [name] Plan to start talazoparib at discharge  Protein calorie malnutrition - doing better with PO intake at discharge, can discharge without TPN  Electrolyte Abnormalities  Hypokalemia  Hypomagnesemia  Hypophosphatemia - improved  DM type II controlled without complication -79/1/[id number]=8.2 -A1c 5.8 -Lipid panel -> HDL 18 -resume home regiment  Deaf -Stable - interpreter used for interactions  Thrombocytopenia: continue to monitor  Procedures: none  Consultations:  GI  oncology  Discharge Exam: Vitals:   02/03/20 0538 02/03/20 1337  BP: 124/69 135/66  Pulse: 92 91  Resp: 18 18  Temp: 98.4 F (36.9 C) 98.4 F (36.9 C)   SpO2:  99% 99%   Feels well, did well with breakfast and lunch No complaints Mother at bedside Discussed d/c plan  Sign language interpreter used  General: No acute distress. Cardiovascular: Heart sounds show Erin [name] regular rate, and rhythm Lungs: Clear to auscultation bilaterally. Abdomen: Soft, nontender, nondistended Neurological: Alert and oriented 3. Moves all extremities 4. Cranial nerves II through XII grossly intact. Skin: Warm and dry. No rashes or lesions. Extremities: No clubbing or cyanosis. No edema.   Discharge Instructions   Discharge Instructions    Call [name] for:  difficulty breathing, headache or visual disturbances   Complete by: As directed    Call [name] for:  extreme fatigue   Complete by: As directed    Call [name] for:  hives   Complete by: As directed    Call [name] for:  persistant dizziness or light-headedness   Complete by: As directed    Call [name] for:  persistant nausea and vomiting   Complete by: As directed    Call [name] for:  redness, tenderness, or signs of infection (pain, swelling, redness, odor or green/yellow discharge around incision site)   Complete by: As directed    Call [name] for:  severe uncontrolled pain   Complete by: As directed    Call [name] for:  temperature >100.4   Complete by: As directed    DIET SOFT   Complete by: As directed    Discharge instructions   Complete by: As directed    You were seen for pain with swallowing related to radiation.  We're also treating you for Erin [name] possible fungal infection with fluconazole.    You've improved with acid blockers (protonix 40 mg twice Erin [name] day) as well as magic mouthwash and antiemetics as needed.  I think if you're tolerating Erin [name] diet and able to maintain your oral intake, we maybe able to avoid the TPN (nutrition through the IV).  We'll stop this at discharge.  Please pay close attention to how you're doing with your diet.    Start your talazoparib Erin [name]) when you go home.  I've sent you home with Erin [name] new  prescription for metformin which can be crushed if needed (your extended release that you have at home cannot be crushed). Return for new, recurrent, or worsening symptoms.  Please ask your PCP to request records from this hospitalization so they know what was done and what the next steps will be.   Increase activity slowly   Complete by: As directed    Increase activity slowly   Complete by: As directed      Allergies as of 02/03/2020   No Known Allergies     Medication List    STOP taking these medications   lansoprazole 30 MG disintegrating tablet Commonly known as: PREVACID SOLUTAB   lidocaine 2 % solution Commonly known as: XYLOCAINE   lisinopril 10 MG tablet Commonly known as: ZESTRIL   metFORMIN 500 MG 24 hr tablet Commonly known as: GLUCOPHAGE-XR Replaced by: metFORMIN 500 MG tablet     TAKE these medications   Accu-Chek FastClix Lancets Misc Use as instructed. Inject into the skin twice daily   Accu-Chek Guide Control Liqd 1 each by In Vitro route once as needed for up to 1 dose.   accu-chek softclix lancets Use as instructed   atorvastatin 40 MG tablet Commonly known as: LIPITOR Take 1 tablet (40 mg total) by mouth daily.   cholecalciferol 1000 units tablet Commonly known as: VITAMIN D Take 1,000 Units by mouth daily.  docusate sodium 100 MG capsule Commonly known as: Colace Take 1 capsule (100 mg total) by mouth 2 (two) times daily.   fentaNYL 25 MCG/HR Commonly known as: [place] 1 patch onto the skin every 3 (three) days.   ferrous sulfate 325 (65 FE) MG tablet TAKE 1 TABLET (325 MG TOTAL) BY MOUTH DAILY BEFORE BREAKFAST.   fluconazole 10 MG/ML suspension Commonly known as: Diflucan Take 20 mLs (200 mg total) by mouth daily for 11 days. What changed: how much to take   glimepiride 2 MG tablet Commonly known as: AMARYL Take 1 tablet (2 mg total) by mouth daily with breakfast. To lower blood sugar   glucose blood test  strip Commonly known as: Accu-Chek Guide Use as instructed. Check blood glucose by fingerstick twice per day.   magic mouthwash Soln Take 10 mLs by mouth 3 (three) times daily as needed for up to 7 days for mouth pain.   metFORMIN 500 MG tablet Commonly known as: Glucophage Take 2 tablets (1,000 mg total) by mouth 2 (two) times daily with Kyshon Tolliver meal. Replaces: metFORMIN 500 MG 24 hr tablet   methocarbamol 500 MG tablet Commonly known as: Robaxin Take 2 tablets (1,000 mg total) by mouth at bedtime as needed for muscle spasms.   ondansetron 4 MG disintegrating tablet Commonly known as: Zofran ODT Take 1 tablet (4 mg total) by mouth every 8 (eight) hours as needed for nausea or vomiting.   pantoprazole 40 MG tablet Commonly known as: PROTONIX Take 1 tablet (40 mg total) by mouth 2 (two) times daily. What changed:   medication strength  how much to take  when to take this   promethazine 12.5 MG suppository Commonly known as: Phenergan Place 1 suppository (12.5 mg total) rectally every 6 (six) hours as needed for nausea or vomiting.   sucralfate 1 GM/10ML suspension Commonly known as: CARAFATE Take 1 gram before each meal and at bedtime   talazoparib tosylate 1 MG capsule Commonly known as: TALZENNA Take 1 capsule (1 mg total) by mouth daily.      No Known Allergies Follow-up Information    [facility] Follow up.   Specialty: Home Health Services Why: For home health RN Contact information: [address] [place] [address] [phone]        Lynnell Catalan, RN .        [facility] Follow up.   Why:   [facility]  [address]   [place]   [place], [place] [address]   Phone:   [phone]   Toll-Free:   [phone]           The results of significant diagnostics from this hospitalization (including imaging, microbiology, ancillary and laboratory) are listed below for reference.     Significant Diagnostic Studies: X-ray chest PA and lateral  Result Date: 01/29/2020 CLINICAL DATA:  Poor intake for 20 days with emesis EXAM: CHEST - 2 VIEW COMPARISON:  CT 01/01/2020 FINDINGS: No consolidation, features of edema, pneumothorax, or effusion. The cardiomediastinal contours are unremarkable. Right IJ approach [place] Downie-Cath tip terminates in the mid SVC. Sclerotic lesions are seen in the T8 and T9 vertebrae. Additional known osseous metastases disease to the spine is poorly visualized on radiography. Sclerotic and lucent changes of the right glenoid may reflect additional site of metastatic disease. No other acute or worrisome osseous or soft tissue abnormality. IMPRESSION: 1. No acute cardiopulmonary abnormality. 2. Extensive osseous metastatic disease including the spine  and right glenoid. Much of which is better visualized on comparison CT. Electronically Signed   By: Lovena Le M.D.   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 EXAM: MRI LUMBAR SPINE WITHOUT AND WITH CONTRAST TECHNIQUE: Multiplanar and multiecho pulse sequences of the lumbar spine were obtained without and with intravenous contrast. CONTRAST:  19m GADAVIST GADOBUTROL 1 MMOL/ML IV SOLN COMPARISON:  CT Abdomen and Pelvis 01/01/2020, PET-CT 10/19/2018. FINDINGS: Segmentation:  Normal on the comparison CT. Alignment: Stable lumbar lordosis from the recent CT. No significant spondylolisthesis. Vertebrae: Extensive visible spinal metastases. T10 small spinous process metastasis. Small metastases in the T11 body and right pedicle. 14 mm metastasis right T12 vertebral body abutting the pedicle. And T12 spinous process metastasis. Subtle metastasis suspected in the left L1 transverse process. Large 24 mm metastasis replacing most of the right  L2 vertebral body. Some extension into the right pedicle. No definite extraosseous extension. Tumor replacing most of the L3 vertebra including the entire body, left pedicle and left posterior elements. Pathologic compression fracture with 30% loss of vertebral body height. Broad-based extension of tumor into the ventral epidural space and also the left L3 neural foramen, with additional early extraosseous tumor along the left lateral vertebral margin. See series 16, image 16. Multifocal tumor replacing about half of the L4 vertebral body primarily to the left of midline. Left pedicle is involved. No definite extraosseous extension at this time. Small 15 mm posterosuperior L5 vertebral body metastasis. Partially visible right lateral sacral ala and medial right iliac bone metastases. There is also partially visible paracentral S3 level tumor on the right. Conus medullaris and cauda equina: Conus extends to the L1 level. No lower spinal cord or conus signal abnormality. No definite abnormal intradural enhancement or dural thickening; there is mild heterogeneity of the ventral cauda equina nerve roots at L1 on axial postcontrast, but this is not correlated on the sagittal. Paraspinal and other soft tissues: Visible abdominal and pelvic viscera are stable from the CT earlier this month. There is mild edema in the left paraspinal muscles at L3-L4 likely related to the L3 extraosseous extension of tumor (series 6, image 15). Disc levels: Mild for age superimposed lumbar spine degeneration. Related to the epidural and extraosseous tumor at L3 there is moderate spinal stenosis, moderate lateral recess stenosis greater on the left, and severe left L3 foraminal stenosis. IMPRESSION: 1. Extensive metastatic disease to the visible spine. 2. Tumor replacement of the L3 vertebral body with pathologic compression fracture, epidural and extraosseous extension. Subsequent moderate spinal and lateral recess stenosis, and tumor  throughout the left L3 foramen. 3. No other pathologic fracture, definite epidural or extraosseous disease. No definite intrathecal metastasis at this time. Electronically Signed   By: HGenevie AnnM.D.   On: 01/07/2020 07:41   MR HIP RIGHT W WO CONTRAST  Result Date: 01/08/2020 CLINICAL DATA:  Metastatic breast cancer. Right hip pain. EXAM: MRI OF THE RIGHT HIP WITHOUT AND WITH CONTRAST TECHNIQUE: Multiplanar, multisequence MR imaging was performed both before and after administration of intravenous contrast. CONTRAST:  767mGADAVIST GADOBUTROL 1 MMOL/ML IV SOLN COMPARISON:  CT scan of the abdomen and pelvis dated 01/01/2020 and PET-CT dated 10/20/2019 FINDINGS: Bones: There are numerous metastatic lesions throughout the bones of the pelvis, much more extensive in numerous than appreciable on the prior  PET-CT or CT scan listed above. There is Jakera Beaupre pathologic vertical linear fracture through the right ilium adjacent to the right SI joint without displacement. There is Tyanna Hach 3.2 cm metastasis in the posterior aspect of the right greater trochanter with destruction of the cortex at the insertion of the obturator externus tendon. There is Alynah Schone small potassium cysts in the superior aspect of the right femoral head. There are multiple metastases in the L4 and L5 vertebral bodies as well as in the sacrum. There is Kalub Morillo metastasis in the left femoral neck. There is extensive metastatic disease involving the majority of the left acetabulum and to Wilmot Quevedo lesser degree of the right acetabulum. Articular cartilage and labrum Articular cartilage:  Normal. Labrum:  Intact. Joint or bursal effusion Joint effusion:  No effusion. Bursae: No bursitis. Muscles and tendons Muscles and tendons: Edema around the operator tendons at the insertion on the posterior aspect of the right greater trochanter secondary to destructive metastasis at that site. There is edema in the adjacent soft tissues due to the metastasis. This involves the insertion of gluteus  medius tendon. Other findings Miscellaneous:   None IMPRESSION: 1. Extensive metastatic disease throughout the bones of the pelvis, much more extensive than appreciable on the prior PET-CT or CT scan listed above. 2. Pathologic vertical fracture through the right ilium adjacent to the right SI joint. Destruction of the posterior cortex of the right greater trochanter. 3. Metastatic disease in the left femoral neck and sacrum. Electronically Signed   By: Lorriane Shire M.D.   On: 01/08/2020 08:18   [place] EKG SITE RITE  Result Date: 01/30/2020 If Site Rite image not attached, placement could not be confirmed due to current cardiac rhythm.  DG ESOPHAGUS W SINGLE CM (SOL OR THIN BA)  Result Date: 02/01/2020 CLINICAL DATA:  Vomiting. Burning in chest. Rule out esophagitis. Breast cancer. EXAM: ESOPHOGRAM/BARIUM SWALLOW TECHNIQUE: Single contrast examination was performed using  Gastrografin. FLUOROSCOPY TIME:  Fluoroscopy Time:  1 minutes 24 seconds Radiation Exposure Index (if provided by the fluoroscopic device): Number of Acquired Spot Images: 0 COMPARISON:  CT chest 01/01/2020 FINDINGS: Patient is deaf. Sign language interpreter assisted with communication. Patient swallowed Gastrografin in the upright position. Esophageal mucosa and motility normal. No esophageal thickening. No ulcer or mass. Negative for stricture. Small hiatal hernia. Barium tablet passed readily into the stomach without delay IMPRESSION: Negative for soft Weslie Rasmus geode stricture or mass. No mucosal edema or ulceration. Small hiatal hernia Barium tablet passed without delay. Electronically Signed   By: Franchot Gallo M.D.   On: 02/01/2020 12:56    Microbiology: Recent Results (from the past 240 hour(s))  SARS Coronavirus 2 by RT PCR (hospital order, performed in [facility] hospital lab) Nasopharyngeal Nasopharyngeal Swab     Status: None   Collection Time: 01/29/20  5:26 PM   Specimen: Nasopharyngeal Swab  Result Value Ref Range Status    SARS Coronavirus 2 NEGATIVE NEGATIVE Final    Comment: (NOTE) SARS-CoV-2 target nucleic acids are NOT DETECTED. The SARS-CoV-2 RNA is generally detectable in upper and lower respiratory specimens during the acute phase of infection. The lowest concentration of SARS-CoV-2 viral copies this assay can detect is 250 copies / mL. Hayzel Ruberg negative result does not preclude SARS-CoV-2 infection and should not be used as the sole basis for treatment or other patient management decisions.  Gisel Vipond negative result may occur with improper specimen collection / handling, submission of specimen other than nasopharyngeal swab, presence of viral mutation(s) within the  areas targeted by this assay, and inadequate number of viral copies (<250 copies / mL). Soraiya Ahner negative result must be combined with clinical observations, patient history, and epidemiological information. Fact Sheet for Patients:   StrictlyIdeas.no Fact Sheet for Healthcare Providers: BankingDealers.co.za This test is not yet approved or cleared  by the [place] FDA and has been authorized for detection and/or diagnosis of SARS-CoV-2 by FDA under an Emergency Use Authorization (EUA).  This EUA will remain in effect (meaning this test can be used) for the duration of the COVID-19 declaration under Section 564(b)(1) of the Act, 21 U.S.C. section 360bbb-3(b)(1), unless the authorization is terminated or revoked sooner. Performed at [facility], [place] [address], [place], [place] [address]      Labs: Basic Metabolic Panel: Recent Labs  Lab 01/30/20 0520 01/30/20 0813 01/30/20 1740 01/31/20 0614 02/01/20 0408 02/02/20 0541 02/03/20 0645  NA 139  --   --  135 135 135 137  K 3.3*   < > 3.2* 3.6 3.1* 3.9 5.0  CL 106  --   --  103 103 105 105  CO2 21*  --   --  _0 GLUCOSE 129*  --   --  162* 146* 148* 166*  BUN <5*  --   --  <5* <5* <5* 10  CREATININE 0.45  --   --   0.33* 0.41* 0.47 0.49  CALCIUM 7.1*  --   --  6.6* 6.9* 7.1* 8.2*  MG 2.0   < > 1.7 1.6* 1.9 1.9 2.0  PHOS <1.0*   < > 1.4* 2.3* 1.6* 2.5 3.1   < > = values in this interval not displayed.   Liver Function Tests: Recent Labs  Lab 01/30/20 0520 01/31/20 0614 02/01/20 0408 02/02/20 0541 02/03/20 0645  AST _1 33  ALT _2 ALKPHOS 76 76 78 80 92  BILITOT 0.5 0.4 0.4 0.5 0.3  PROT 7.1 6.5 6.6 6.5 6.9  ALBUMIN 2.9* 2.6* 2.6* 2.6* 2.7*   Recent Labs  Lab 01/29/20 1606  LIPASE 18   No results for input(s): AMMONIA in the last 168 hours. CBC: Recent Labs  Lab 01/30/20 0520 01/31/20 0614 02/01/20 0408 02/02/20 0541 02/03/20 0645  WBC 4.5 4.3 3.5* 3.1* 3.4*  NEUTROABS 3.1 3.1 2.3 2.0 2.0  HGB 9.3* 9.1* 8.7* 8.7* 9.2*  HCT 28.6* 27.6* 27.7* 27.4* 28.9*  MCV 90.8 91.4 92.0 92.9 94.1  PLT 105* 97* 94* 95* 104*   Cardiac Enzymes: No results for input(s): CKTOTAL, CKMB, CKMBINDEX, TROPONINI in the last 168 hours. BNP: BNP (last 3 results) No results for input(s): BNP in the last 8760 hours.  ProBNP (last 3 results) No results for input(s): PROBNP in the last 8760 hours.  CBG: Recent Labs  Lab 02/02/20 1209 02/02/20 1549 02/02/20 2357 02/03/20 0540 02/03/20 1145  GLUCAP 117* 123* 156* 153* 132*       Signed:  Fayrene Helper [name].  [facility] 02/03/2020, 2:48 PM

## 2020-02-05 ENCOUNTER — Telehealth: Payer: Self-pay

## 2020-02-05 ENCOUNTER — Telehealth: Payer: Self-pay | Admitting: *Deleted

## 2020-02-05 ENCOUNTER — Telehealth: Payer: Self-pay | Admitting: Family Medicine

## 2020-02-05 ENCOUNTER — Other Ambulatory Visit (HOSPITAL_COMMUNITY): Payer: Medicaid Other

## 2020-02-05 NOTE — Telephone Encounter (Signed)
RN placed call to pt mother to see how pt felt over the weekend.  States pt is able to drink and eat and has been feeling a lot better.  States pt is also able to completely take all of her medication.  RN encouraged pt mother to call the office if pt is unable to eat or drink again.  Verbalized understanding.

## 2020-02-05 NOTE — Telephone Encounter (Signed)
Transition Care Management Follow-up Telephone Call Date of discharge and from where: 02/03/2020, [facility].  Call placed to patient  - video phone # [phone] [facility] Phone) the message stated that the person is unavailable,and then hung up. Call placed to # [phone], message left with call back requested to this CM Calls placed to patient's mother, Erin [name] # [phone], the message stated that calls were being screened and then the phone hung up.  Call then placed to her cell # [phone], message left with call back requested to this CM

## 2020-02-05 NOTE — Telephone Encounter (Signed)
Patient mother called back saying that a voice mail was left for the patient to call back. Phone number was verified for the patient [phone]

## 2020-02-06 ENCOUNTER — Inpatient Hospital Stay: Payer: Medicaid Other

## 2020-02-06 ENCOUNTER — Ambulatory Visit: Payer: Medicaid Other | Admitting: Hematology and Oncology

## 2020-02-06 ENCOUNTER — Ambulatory Visit: Payer: Medicaid Other

## 2020-02-06 ENCOUNTER — Other Ambulatory Visit: Payer: Medicaid Other

## 2020-02-06 ENCOUNTER — Inpatient Hospital Stay: Payer: Medicaid Other | Admitting: Hematology and Oncology

## 2020-02-06 ENCOUNTER — Telehealth: Payer: Self-pay | Admitting: Family Medicine

## 2020-02-06 NOTE — Telephone Encounter (Signed)
I believe she is referring to coming off of diabetes medication, so this would be a diabetes visit. Can we schedule this patient with me?

## 2020-02-06 NOTE — Telephone Encounter (Signed)
Patient requested for a call back on [phone] with sign language interpreter. Please follow up at your earliest convenience.

## 2020-02-06 NOTE — Telephone Encounter (Signed)
Can you schedule appointment with the patient to discuss which medication she may be able to discontinue.  I believe she is also currently undergoing treatment for metastatic breast cancer

## 2020-02-06 NOTE — Telephone Encounter (Signed)
Patient called in to inform pcp that she would like to take less medications and try diet an exercise for a period of time regarding current health conditions.

## 2020-02-06 NOTE — Assessment & Plan Note (Deleted)
Left lumpectomy: IDC grade 3, 1.2 cm, DCIS is present, margins negative, 0/6 lymph nodes negative, ER 0%, PR 0%, HER-2 positive ratio 2.28, Ki-67 20%, T1 BN 0 stage IA S/P Taxol-Herceptin BRCA 2 Mutation Positive  10/20/19:Bone mets involving Right hip, Bil Iliac bones, T8, T9, L2 and L3, Rt prox humerus, Righ glenoid. Some lytic and some sclerotic. Mild Left supraclav adenopathy, 2 small lesions basal ganglia (could be lacunar infarcts)  Biopsy the supra clav LN: 10/31/2019: Metastatic breast cancer, ER/PR negative HER-2 equivocal by IHC, FISHpositive Treatmentsummary: Kadcyla every 3-week treatment.Discontinued for progression ---------------------------------------------------------------------------------------------------------------------------------------------------------- Echocardiogram 11/14/2019 Current treatment:Talazoparib to be started 02/06/2020  Severe radiation esophagitis: Improvement after the hospitalization.  Patient is able to swallow a lot better. I recommended that she start immediately on talazoparib at this time.  I would like to see her back in 1 week to assess tolerability to the treatment.

## 2020-02-06 NOTE — Telephone Encounter (Signed)
Transition Care Management Follow-up Telephone Call  Call completed with the assistance of [employer] video [employer] employee # [phone]   Date of discharge and from where: 02/03/2020, [facility]   How have you been since you were released from the hospital? She said that she is doing " fine."   Any questions or concerns? she was interested in obtaining a freestyle libre.  Explained to her that she is not on insulin it is usually only covered if a person is on a sliding scale insulin.   She is interested in speaking with a nutritionist and will reach out to the nutritionist at the cancer center when she is there next week for her appt. She said that she has been eating better and has not had any problems eating. She has been having breakfast, lunch and dinner. She has been having soups and solid foods without any difficulty.   Items Reviewed:  Did the pt receive and understand the discharge instructions provided?  yes  Medications obtained and verified?  yes, she has her medications and did not have any questions.   Any new allergies since your discharge?  none reported   Do you have support at home?  she lives alone but said that her family comes to check on her   Other (ie: DME, Home Health, etc) referral made to [facility] at discharge. The patient said that she has not heard from them yet.  She has a glucometer but has not checked her blood sugar today.   Functional Questionnaire: (I = Independent and D = Dependent) ADL's: independent   Follow up appointments reviewed:    PCP Hospital f/u appt confirmed?.Dr Chapman Fitch - 02/19/2020 @ [facility] f/u appt confirmed? cancer center - 02/14/2020  Are transportation arrangements needed?  no. She has a car  If their condition worsens, is the pt aware to call  their PCP or go to the ED? yes  Was the patient provided with contact information for the PCP's office or ED? she has the clinic phone number  Was the pt encouraged to  call back with questions or concerns?  yes

## 2020-02-08 MED FILL — TALZENNA 1 MG CAPS: 1 | 30 days supply | Qty: 30 | Fill #1

## 2020-02-13 NOTE — Progress Notes (Signed)
 Patient Care Team: Fulp, Cammie, MD as PCP - General (Family Medicine) Hoxworth, Benjamin, MD (Inactive) as Consulting Physician (General Surgery) Mckala Pantaleon, MD as Consulting Physician (Hematology and Oncology) Moody, John, MD as Consulting Physician (Radiation Oncology) Rossi, Emma, MD as Consulting Physician (Gynecologic Oncology) Causey, Lindsey Cornetto, NP as Nurse Practitioner (Hematology and Oncology) Stuart, Dawn C, RN as Oncology Nurse Navigator Martini, Keisha N, RN as Oncology Nurse Navigator  DIAGNOSIS:    ICD-10-CM   1. Malignant neoplasm of lower-outer quadrant of left breast of female, estrogen receptor negative (HCC)  C50.512    Z17.1     SUMMARY OF ONCOLOGIC HISTORY: Oncology History  Malignant neoplasm of lower-outer quadrant of left breast of female, estrogen receptor negative (HCC)  05/11/2017 Initial Diagnosis   Left breast biopsy 3:30 position 6 cm from nipple: IDC with DCIS, lymphovascular invasion present, grade 2-3, ER 0%, PR 0%, HER-2 positive ratio 2.28, Ki-67 20%, 1 cm lesion left breast, T1b N0 stage IA clinical stage   06/09/2017 Surgery   Left lumpectomy: IDC grade 3, 1.2 cm, DCIS is present, margins negative, 0/6 lymph nodes negative, ER 0%, PR 0%, HER-2 positive ratio 2.28, Ki-67 20%, T1 BN 0 stage IA   07/09/2017 - 09/29/2017 Adjuvant Chemotherapy   Taxol/Herceptin x 12 completed on 09/29/2017, went on to receive every 3 week adjuvant Herceptin through 06/28/2018   11/25/2017 - 01/07/2018 Radiation Therapy    The patient initially received a dose of 50.4 Gy in 28 fractions to the breast using whole-breast tangent fields. This was delivered using a 3-D conformal technique. The patient then received a boost to the seroma. This delivered an additional 10 Gy in 5 fractions using a 3-D technique. The total dose was 60.4 Gy.   02/15/2018 Genetic Testing   The Common Hereditary Cancer Panel offered by [facility] includes sequencing and/or deletion duplication  testing of the following 47 genes: APC, ATM, AXIN2, BARD1, BMPR1A, BRCA1, BRCA2, BRIP1, CDH1, CDKN2A (p14ARF), CDKN2A (p16INK4a), CKD4, CHEK2, CTNNA1, DICER1, EPCAM (Deletion/duplication testing only), GREM1 (promoter region deletion/duplication testing only), KIT, MEN1, MLH1, MSH2, MSH3, MSH6, MUTYH, NBN, NF1, NHTL1, PALB2, PDGFRA, PMS2, POLD1, POLE, PTEN, RAD50, RAD51C, RAD51D, SDHB, SDHC, SDHD, SMAD4, SMARCA4. STK11, TP53, TSC1, TSC2, and VHL.  The following genes were evaluated for sequence changes only: SDHA and HOXB13 c.251G>A variant only.   Results: POSITIVE. 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 The date of this test report is 02/15/2018   10/20/2019 PET scan   Bone mets involving Right hip, Bil Iliac bones, T8, T9, L2 and L3, Rt prox humerus, Righ glenoid. Some lytic and some sclerotic. Mild Left supraclav adenopathy, 2 small lesions basal ganglia (could be lacunar infarcts)   11/14/2019 - 01/15/2020 Chemotherapy   The patient had palonosetron (ALOXI) injection 0.25 mg, 0.25 mg, Intravenous,  Once, 2 of 5 cycles Administration: 0.25 mg (12/05/2019), 0.25 mg (12/26/2019) ado-trastuzumab emtansine (KADCYLA) 260 mg in sodium chloride 0.9 % 250 mL chemo infusion, 3.6 mg/kg = 260 mg, Intravenous, Once, 3 of 6 cycles Administration: 260 mg (11/14/2019), 260 mg (12/05/2019), 260 mg (12/26/2019)  for chemotherapy treatment.    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  Required TPN briefly.  Patient was able to swallow and keep food down by the time of discharge.   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 Some lytic and some sclerotic. Mild Left supraclav adenopathy, 2 small lesions basal ganglia (could be lacunar infarcts)   11/14/2019 - 01/15/2020 Chemotherapy   The patient had palonosetron (ALOXI) injection 0.25 mg, 0.25 mg, Intravenous,  Once, 2 of 5 cycles Administration: 0.25 mg (12/05/2019), 0.25 mg (12/26/2019) ado-trastuzumab emtansine (KADCYLA) 260 mg in sodium chloride 0.9 % 250 mL chemo infusion, 3.6 mg/kg = 260 mg, Intravenous, Once, 3 of 6 cycles Administration: 260 mg (11/14/2019), 260 mg (12/05/2019), 260 mg (12/26/2019)  for chemotherapy treatment.      CHIEF COMPLIANT: Follow-up ofmetastatic breast cancer, recent hospitalization  INTERVAL HISTORY: Erin [name] is a 59 y.o. with above-mentioned history of metastaticbreast cancerwith bone metastases,and fallopian tube cancer.She is currently on treatment with Talazopariband Xgeva.She was unable to eat due to radiation induced esophagitis and was admitted from the ED from 01/29/20-02/03/20. She improved with IV PPI, fluconazole, antiemetics and magic mouth wash and was dischargfed tolerating her diet orally. She presents to the clinic todayfor treatment and follow-up of her hospitalization.   She has started talazoparib and appears to be tolerating it very well.  She is able to eat most foods without any problems or concerns.  ALLERGIES:  has No Known Allergies.  MEDICATIONS:  Current Outpatient Medications  Medication Sig Dispense Refill  . Accu-Chek FastClix Lancets MISC Use as instructed. Inject into the skin twice daily 100 each 3  . atorvastatin (LIPITOR) 40 MG tablet Take 1 tablet (40 mg total) by mouth daily. 90 tablet 1  . Blood Glucose Calibration (ACCU-CHEK GUIDE CONTROL) LIQD  1 each by In Vitro route once as needed for up to 1 dose. 1 each 0  . cholecalciferol (VITAMIN D) 1000 units tablet Take 1,000 Units by mouth daily.    Marland Kitchen docusate sodium (COLACE) 100 MG capsule Take 1 capsule (100 mg total) by mouth 2 (two) times daily. 60 capsule 3  . fentaNYL (DURAGESIC) 25 MCG/HR Place 1 patch onto the skin every 3 (three) days. (Patient not taking: Reported on 01/29/2020) 10 patch 0  . ferrous sulfate 325 (65 FE) MG tablet TAKE 1 TABLET (325 MG TOTAL) BY MOUTH DAILY BEFORE BREAKFAST. 90 tablet 1  . fluconazole (DIFLUCAN) 10 MG/ML suspension Take 20 mLs (200 mg total) by mouth daily for 11 days. 220 mL 0  . glimepiride (AMARYL) 2 MG tablet Take 1 tablet (2 mg total) by mouth daily with breakfast. To lower blood sugar 90 tablet 3  . glucose blood (ACCU-CHEK GUIDE) test strip Use as instructed. Check blood glucose by fingerstick twice per day. 100 each 12  . Lancet Devices (ACCU-CHEK SOFTCLIX) lancets Use as instructed 1 each 0  . metFORMIN (GLUCOPHAGE) 500 MG tablet Take 2 tablets (1,000 mg total) by mouth 2 (two) times daily with a meal. 60 tablet 1  . methocarbamol (ROBAXIN) 500 MG tablet Take 2 tablets (1,000 mg total) by mouth at bedtime as needed for muscle spasms. 60 tablet 0  . ondansetron (ZOFRAN ODT) 4 MG disintegrating tablet Take 1 tablet (4 mg total) by mouth every 8 (eight) hours  as needed for nausea or vomiting. (Patient not taking: Reported on 01/29/2020) 90 tablet 0  . pantoprazole (PROTONIX) 40 MG tablet Take 1 tablet (40 mg total) by mouth 2 (two) times daily. 60 tablet 1  . promethazine (PHENERGAN) 12.5 MG suppository Place 1 suppository (12.5 mg total) rectally every 6 (six) hours as needed for nausea or vomiting. 12 each 0  . sucralfate (CARAFATE) 1 GM/10ML suspension Take 1 gram before each meal and at bedtime 420 mL 1  . talazoparib tosylate (TALZENNA) 1 MG capsule Take 1 capsule (1 mg total) by mouth daily. (Patient not taking: Reported on 01/29/2020) 30 capsule  3   No current facility-administered medications for this visit.   Facility-Administered Medications Ordered in Other Visits  Medication Dose Route Frequency Provider Last Rate Last Admin  . heparin lock flush 100 unit/mL  500 Units Intracatheter Once PRN [name], MD      . sodium chloride flush (NS) 0.9 % injection 10 mL  10 mL Intracatheter PRN [name], MD        PHYSICAL EXAMINATION: ECOG PERFORMANCE STATUS: 1 - Symptomatic but completely ambulatory  Vitals:   02/14/20 1002  BP: 125/75  Pulse: (!) 105  Resp: 18  Temp: 98.9 F (37.2 C)  SpO2: 100%   Filed Weights   02/14/20 1002  Weight: 132 lb 3.2 oz (60 kg)    LABORATORY DATA:  I have reviewed the data as listed CMP Latest Ref Rng & Units 02/14/2020 02/03/2020 02/02/2020  Glucose 70 - 99 mg/dL 103(H) 166(H) 148(H)  BUN 6 - 20 mg/dL <4(L) 10 <5(L)  Creatinine 0.44 - 1.00 mg/dL 0.64 0.49 0.47  Sodium 135 - 145 mmol/L 141 137 135  Potassium 3.5 - 5.1 mmol/L 4.3 5.0 3.9  Chloride 98 - 111 mmol/L 107 105 105  CO2 22 - 32 mmol/L _0 Calcium 8.9 - 10.3 mg/dL 8.7(L) 8.2(L) 7.1(L)  Total Protein 6.5 - 8.1 g/dL 7.6 6.9 6.5  Total Bilirubin 0.3 - 1.2 mg/dL 0.4 0.3 0.5  Alkaline Phos 38 - 126 U/L 113 92 80  AST 15 - 41 U/L 30 33 27  ALT 0 - 44 U/L 37 30 20    Lab Results  Component Value Date   WBC 3.2 (L) 02/14/2020   HGB 9.3 (L) 02/14/2020   HCT 29.5 (L) 02/14/2020   MCV 93.4 02/14/2020   PLT 57 (L) 02/14/2020   NEUTROABS 1.8 02/14/2020    ASSESSMENT & PLAN:  Malignant neoplasm of lower-outer quadrant of left breast of female, estrogen receptor negative (HCC) Left lumpectomy: IDC grade 3, 1.2 cm, DCIS is present, margins negative, 0/6 lymph nodes negative, ER 0%, PR 0%, HER-2 positive ratio 2.28, Ki-67 20%, T1 BN 0 stage IA S/P Taxol-Herceptin BRCA 2 Mutation Positive  10/20/19:Bone mets involving Right hip, Bil Iliac bones, T8, T9, L2 and L3, Rt prox humerus, Righ glenoid. Some lytic and some  sclerotic. Mild Left supraclav adenopathy, 2 small lesions basal ganglia (could be lacunar infarcts)  Biopsy the supra clav LN: 10/31/2019: Metastatic breast cancer, ER/PR negative HER-2 equivocal by IHC, FISHpositive Treatmentsummary: Kadcyla every 3-week treatment.Discontinued for progression  MRI of the back 01/07/2020: Extensive metastatic disease to the involved spine. Tumor replacement of L3 vertebral body with pathologic compression fracture, epidural and extraosseous extension. Tumor extending throughout L3 foramen Status post palliative radiation therapy Causing severe dysphagia and dehydration and hospitalization 01/29/2020-02/03/2020 ---------------------------------------------------------------------------------------------------------------------------------------------------------- Echocardiogram 11/14/2019 Current treatment:Talazoparib started 02/07/2020 Toxicities: 1.  Thrombocytopenia: If platelet counts drop  below 50,000 then we will have to hold talazoparib and resume if platelets increased to more than 75,000. 2.  Leukopenia Patient does not experience any side effects to treatment so far. Return to clinic in 1 week to recheck her labs.  I am concerned about dropping platelet counts.  We may have to reduce the dosage of her treatment if her labs continue to worsen.   No orders of the defined types were placed in this encounter.  The patient has a good understanding of the overall plan. she agrees with it. she will call with any problems that may develop before the next visit here.  Total time spent: 30 mins including face to face time and time spent for planning, charting and coordination of care  [name], MD 02/14/2020  I, Cloyde Reams Dorshimer, am acting as scribe for [name].  I have reviewed the above documentation for accuracy and completeness, and I agree with the above.

## 2020-02-14 ENCOUNTER — Inpatient Hospital Stay: Payer: Medicaid Other | Attending: Gynecologic Oncology

## 2020-02-14 ENCOUNTER — Inpatient Hospital Stay: Payer: Medicaid Other

## 2020-02-14 ENCOUNTER — Other Ambulatory Visit: Payer: Self-pay

## 2020-02-14 ENCOUNTER — Telehealth: Payer: Self-pay | Admitting: Hematology and Oncology

## 2020-02-14 ENCOUNTER — Inpatient Hospital Stay (HOSPITAL_BASED_OUTPATIENT_CLINIC_OR_DEPARTMENT_OTHER): Payer: Medicaid Other | Admitting: Hematology and Oncology

## 2020-02-14 DIAGNOSIS — Z8349 Family history of other endocrine, nutritional and metabolic diseases: Secondary | ICD-10-CM | POA: Insufficient documentation

## 2020-02-14 DIAGNOSIS — Z171 Estrogen receptor negative status [ER-]: Secondary | ICD-10-CM | POA: Insufficient documentation

## 2020-02-14 DIAGNOSIS — R110 Nausea: Secondary | ICD-10-CM | POA: Insufficient documentation

## 2020-02-14 DIAGNOSIS — C50512 Malignant neoplasm of lower-outer quadrant of left female breast: Secondary | ICD-10-CM | POA: Insufficient documentation

## 2020-02-14 DIAGNOSIS — Z9221 Personal history of antineoplastic chemotherapy: Secondary | ICD-10-CM | POA: Diagnosis not present

## 2020-02-14 DIAGNOSIS — Z833 Family history of diabetes mellitus: Secondary | ICD-10-CM | POA: Diagnosis not present

## 2020-02-14 DIAGNOSIS — Z79899 Other long term (current) drug therapy: Secondary | ICD-10-CM | POA: Insufficient documentation

## 2020-02-14 DIAGNOSIS — Z90722 Acquired absence of ovaries, bilateral: Secondary | ICD-10-CM | POA: Diagnosis not present

## 2020-02-14 DIAGNOSIS — Z90710 Acquired absence of both cervix and uterus: Secondary | ICD-10-CM | POA: Insufficient documentation

## 2020-02-14 DIAGNOSIS — Z803 Family history of malignant neoplasm of breast: Secondary | ICD-10-CM | POA: Diagnosis not present

## 2020-02-14 DIAGNOSIS — Z800 Family history of malignant neoplasm of digestive organs: Secondary | ICD-10-CM | POA: Insufficient documentation

## 2020-02-14 DIAGNOSIS — C50919 Malignant neoplasm of unspecified site of unspecified female breast: Secondary | ICD-10-CM

## 2020-02-14 DIAGNOSIS — C5701 Malignant neoplasm of right fallopian tube: Secondary | ICD-10-CM | POA: Diagnosis not present

## 2020-02-14 DIAGNOSIS — D72819 Decreased white blood cell count, unspecified: Secondary | ICD-10-CM | POA: Diagnosis not present

## 2020-02-14 DIAGNOSIS — Z9079 Acquired absence of other genital organ(s): Secondary | ICD-10-CM | POA: Diagnosis not present

## 2020-02-14 DIAGNOSIS — Z923 Personal history of irradiation: Secondary | ICD-10-CM | POA: Insufficient documentation

## 2020-02-14 DIAGNOSIS — Z8249 Family history of ischemic heart disease and other diseases of the circulatory system: Secondary | ICD-10-CM | POA: Insufficient documentation

## 2020-02-14 DIAGNOSIS — Z95828 Presence of other vascular implants and grafts: Secondary | ICD-10-CM

## 2020-02-14 DIAGNOSIS — D696 Thrombocytopenia, unspecified: Secondary | ICD-10-CM | POA: Diagnosis not present

## 2020-02-14 DIAGNOSIS — Z7984 Long term (current) use of oral hypoglycemic drugs: Secondary | ICD-10-CM | POA: Diagnosis not present

## 2020-02-14 DIAGNOSIS — C7951 Secondary malignant neoplasm of bone: Secondary | ICD-10-CM | POA: Diagnosis present

## 2020-02-14 LAB — CBC WITH DIFFERENTIAL (CANCER CENTER ONLY)
Abs Immature Granulocytes: 0.01 10*3/uL (ref 0.00–0.07)
Basophils Absolute: 0 10*3/uL (ref 0.0–0.1)
Basophils Relative: 0 %
Eosinophils Absolute: 0 10*3/uL (ref 0.0–0.5)
Eosinophils Relative: 1 %
HCT: 29.5 % — ABNORMAL LOW (ref 36.0–46.0)
Hemoglobin: 9.3 g/dL — ABNORMAL LOW (ref 12.0–15.0)
Immature Granulocytes: 0 %
Lymphocytes Relative: 35 %
Lymphs Abs: 1.1 10*3/uL (ref 0.7–4.0)
MCH: 29.4 pg (ref 26.0–34.0)
MCHC: 31.5 g/dL (ref 30.0–36.0)
MCV: 93.4 fL (ref 80.0–100.0)
Monocytes Absolute: 0.3 10*3/uL (ref 0.1–1.0)
Monocytes Relative: 8 %
Neutro Abs: 1.8 10*3/uL (ref 1.7–7.7)
Neutrophils Relative %: 56 %
Platelet Count: 57 10*3/uL — ABNORMAL LOW (ref 150–400)
RBC: 3.16 MIL/uL — ABNORMAL LOW (ref 3.87–5.11)
RDW: 18.8 % — ABNORMAL HIGH (ref 11.5–15.5)
WBC Count: 3.2 10*3/uL — ABNORMAL LOW (ref 4.0–10.5)
nRBC: 0 % (ref 0.0–0.2)

## 2020-02-14 LAB — CMP (CANCER CENTER ONLY)
ALT: 37 U/L (ref 0–44)
AST: 30 U/L (ref 15–41)
Albumin: 3.1 g/dL — ABNORMAL LOW (ref 3.5–5.0)
Alkaline Phosphatase: 113 U/L (ref 38–126)
Anion gap: 12 (ref 5–15)
BUN: <4 mg/dL — ABNORMAL LOW (ref 6–20)
CO2: 22 mmol/L (ref 22–32)
Calcium: 8.7 mg/dL — ABNORMAL LOW (ref 8.9–10.3)
Chloride: 107 mmol/L (ref 98–111)
Creatinine: 0.64 mg/dL (ref 0.44–1.00)
GFR, Est AFR Am: >60 mL/min (ref >60)
GFR, Estimated: >60 mL/min (ref >60)
Glucose, Bld: 103 mg/dL — ABNORMAL HIGH (ref 70–99)
Potassium: 4.3 mmol/L (ref 3.5–5.1)
Sodium: 141 mmol/L (ref 135–145)
Total Bilirubin: 0.4 mg/dL (ref 0.3–1.2)
Total Protein: 7.6 g/dL (ref 6.5–8.1)

## 2020-02-14 MED ORDER — HEPARIN SOD (PORK) LOCK FLUSH 100 UNIT/ML IV SOLN
500.0000 [IU] | Freq: Once | INTRAVENOUS | Status: AC | PRN
  Administered 2020-02-14: 500 [IU]
  Filled 2020-02-14: qty 5

## 2020-02-14 MED ORDER — SODIUM CHLORIDE 0.9% FLUSH
10.0000 mL | INTRAVENOUS | Status: DC | PRN
  Administered 2020-02-14: 10 mL
  Filled 2020-02-14: qty 10

## 2020-02-14 NOTE — Assessment & Plan Note (Signed)
Left lumpectomy: IDC grade 3, 1.2 cm, DCIS is present, margins negative, 0/6 lymph nodes negative, ER 0%, PR 0%, HER-2 positive ratio 2.28, Ki-67 20%, T1 BN 0 stage IA S/P Taxol-Herceptin BRCA 2 Mutation Positive  10/20/19:Bone mets involving Right hip, Bil Iliac bones, T8, T9, L2 and L3, Rt prox humerus, Righ glenoid. Some lytic and some sclerotic. Mild Left supraclav adenopathy, 2 small lesions basal ganglia (could be lacunar infarcts)  Biopsy the supra clav LN: 10/31/2019: Metastatic breast cancer, ER/PR negative HER-2 equivocal by IHC, FISHpositive Treatmentsummary: Kadcyla every 3-week treatment.Discontinued for progression  MRI of the back 01/07/2020: Extensive metastatic disease to the involved spine. Tumor replacement of L3 vertebral body with pathologic compression fracture, epidural and extraosseous extension. Tumor extending throughout L3 foramen Status post palliative radiation therapy Causing severe dysphagia and dehydration and hospitalization 01/29/2020-02/03/2020 ---------------------------------------------------------------------------------------------------------------------------------------------------------- Echocardiogram 11/14/2019 Current treatment:Talazoparib started 02/07/2020 Toxicities:

## 2020-02-14 NOTE — Patient Instructions (Signed)

## 2020-02-14 NOTE — Telephone Encounter (Signed)
Scheduled per 06/02 los, patient received after visit summary and calender.

## 2020-02-19 ENCOUNTER — Ambulatory Visit: Payer: Medicaid Other | Admitting: Family Medicine

## 2020-02-20 NOTE — Progress Notes (Signed)
 Patient Care Team: Fulp, Cammie, MD as PCP - General (Family Medicine) Hoxworth, Benjamin, MD (Inactive) as Consulting Physician (General Surgery) , , MD as Consulting Physician (Hematology and Oncology) Moody, John, MD as Consulting Physician (Radiation Oncology) Rossi, Emma, MD as Consulting Physician (Gynecologic Oncology) Causey, Lindsey Cornetto, NP as Nurse Practitioner (Hematology and Oncology) Stuart, Dawn C, RN as Oncology Nurse Navigator Martini, Keisha N, RN as Oncology Nurse Navigator  DIAGNOSIS:    ICD-10-CM   1. Malignant neoplasm of lower-outer quadrant of left breast of female, estrogen receptor negative (HCC)  C50.512    Z17.1     SUMMARY OF ONCOLOGIC HISTORY: Oncology History  Malignant neoplasm of lower-outer quadrant of left breast of female, estrogen receptor negative (HCC)  05/11/2017 Initial Diagnosis   Left breast biopsy 3:30 position 6 cm from nipple: IDC with DCIS, lymphovascular invasion present, grade 2-3, ER 0%, PR 0%, HER-2 positive ratio 2.28, Ki-67 20%, 1 cm lesion left breast, T1b N0 stage IA clinical stage   06/09/2017 Surgery   Left lumpectomy: IDC grade 3, 1.2 cm, DCIS is present, margins negative, 0/6 lymph nodes negative, ER 0%, PR 0%, HER-2 positive ratio 2.28, Ki-67 20%, T1 BN 0 stage IA   07/09/2017 - 09/29/2017 Adjuvant Chemotherapy   Taxol/Herceptin x 12 completed on 09/29/2017, went on to receive every 3 week adjuvant Herceptin through 06/28/2018   11/25/2017 - 01/07/2018 Radiation Therapy    The patient initially received a dose of 50.4 Gy in 28 fractions to the breast using whole-breast tangent fields. This was delivered using a 3-D conformal technique. The patient then received a boost to the seroma. This delivered an additional 10 Gy in 5 fractions using a 3-D technique. The total dose was 60.4 Gy.   02/15/2018 Genetic Testing   The Common Hereditary Cancer Panel offered by [facility] includes sequencing and/or deletion duplication  testing of the following 47 genes: APC, ATM, AXIN2, BARD1, BMPR1A, BRCA1, BRCA2, BRIP1, CDH1, CDKN2A (p14ARF), CDKN2A (p16INK4a), CKD4, CHEK2, CTNNA1, DICER1, EPCAM (Deletion/duplication testing only), GREM1 (promoter region deletion/duplication testing only), KIT, MEN1, MLH1, MSH2, MSH3, MSH6, MUTYH, NBN, NF1, NHTL1, PALB2, PDGFRA, PMS2, POLD1, POLE, PTEN, RAD50, RAD51C, RAD51D, SDHB, SDHC, SDHD, SMAD4, SMARCA4. STK11, TP53, TSC1, TSC2, and VHL.  The following genes were evaluated for sequence changes only: SDHA and HOXB13 c.251G>A variant only.   Results: POSITIVE. 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 The date of this test report is 02/15/2018   10/20/2019 PET scan   Bone mets involving Right hip, Bil Iliac bones, T8, T9, L2 and L3, Rt prox humerus, Righ glenoid. Some lytic and some sclerotic. Mild Left supraclav adenopathy, 2 small lesions basal ganglia (could be lacunar infarcts)   11/14/2019 - 01/15/2020 Chemotherapy   The patient had palonosetron (ALOXI) injection 0.25 mg, 0.25 mg, Intravenous,  Once, 2 of 5 cycles Administration: 0.25 mg (12/05/2019), 0.25 mg (12/26/2019) ado-trastuzumab emtansine (KADCYLA) 260 mg in sodium chloride 0.9 % 250 mL chemo infusion, 3.6 mg/kg = 260 mg, Intravenous, Once, 3 of 6 cycles Administration: 260 mg (11/14/2019), 260 mg (12/05/2019), 260 mg (12/26/2019)  for chemotherapy treatment.    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  Required TPN briefly.  Patient was able to swallow and keep food down by the time of discharge.   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 Some lytic and some sclerotic. Mild Left supraclav adenopathy, 2 small lesions basal ganglia (could be lacunar infarcts)   11/14/2019 - 01/15/2020 Chemotherapy   The patient had palonosetron (ALOXI) injection 0.25 mg, 0.25 mg, Intravenous,  Once, 2 of 5 cycles Administration: 0.25 mg (12/05/2019), 0.25 mg (12/26/2019) ado-trastuzumab emtansine (KADCYLA) 260 mg in sodium chloride 0.9 % 250 mL chemo infusion, 3.6 mg/kg = 260 mg, Intravenous, Once, 3 of 6 cycles Administration: 260 mg (11/14/2019), 260 mg (12/05/2019), 260 mg (12/26/2019)  for chemotherapy treatment.      CHIEF COMPLIANT: Follow-up ofmetastatic breast cancer on talazoparib  INTERVAL HISTORY: Erin [name] is a 59 y.o. with above-mentioned history of metastaticbreast cancerwith bone metastases,and fallopian tube cancer.She is currently on treatment with Talazopariband Xgeva.She presents to the clinic todayfor treatment.  Last week patient had thrombocytopenia as a result of talazoparib.  She is here today to recheck the labs and to decide if the dose needs to be adjusted.  She is complaining of nausea that lasted for couple of days last week when she tried to eat more solid food.  And she went back to eating soft foods nausea subsided.  She has not had a bowel movement in the past 3 days.  She is drinking water fairly well.  She has lost a couple of pounds since last week.  ALLERGIES:  has No Known Allergies.  MEDICATIONS:  Current Outpatient Medications  Medication Sig Dispense Refill  . Accu-Chek FastClix Lancets MISC Use as instructed. Inject into the skin twice daily 100 each 3  . atorvastatin (LIPITOR) 40 MG tablet Take 1 tablet (40 mg total) by mouth daily. 90 tablet 1  . Blood  Glucose Calibration (ACCU-CHEK GUIDE CONTROL) LIQD 1 each by In Vitro route once as needed for up to 1 dose. 1 each 0  . cholecalciferol (VITAMIN D) 1000 units tablet Take 1,000 Units by mouth daily.    . ferrous sulfate 325 (65 FE) MG tablet TAKE 1 TABLET (325 MG TOTAL) BY MOUTH DAILY BEFORE BREAKFAST. 90 tablet 1  . glimepiride (AMARYL) 2 MG tablet Take 1 tablet (2 mg total) by mouth daily with breakfast. To lower blood sugar 90 tablet 3  . glucose blood (ACCU-CHEK GUIDE) test strip Use as instructed. Check blood glucose by fingerstick twice per day. 100 each 12  . Lancet Devices (ACCU-CHEK SOFTCLIX) lancets Use as instructed 1 each 0  . metFORMIN (GLUCOPHAGE) 500 MG tablet Take 2 tablets (1,000 mg total) by mouth 2 (two) times daily with a meal. 60 tablet 1  . ondansetron (ZOFRAN ODT) 4 MG disintegrating tablet Take 1 tablet (4 mg total) by mouth every 8 (eight) hours as needed for nausea or vomiting. (Patient not taking: Reported on 01/29/2020) 90 tablet 0  . pantoprazole (PROTONIX) 40 MG tablet Take 1 tablet (40 mg total) by mouth 2 (two) times daily. 60 tablet 1  . talazoparib tosylate (TALZENNA) 1 MG capsule Take 1 capsule (1 mg total) by mouth daily. (Patient not taking: Reported on 01/29/2020) 30 capsule 3   No current facility-administered medications  for this visit.   Facility-Administered Medications Ordered in Other Visits  Medication Dose Route Frequency Provider Last Rate Last Admin  . heparin lock flush 100 unit/mL  500 Units Intracatheter Once PRN , , MD      . sodium chloride flush (NS) 0.9 % injection 10 mL  10 mL Intracatheter PRN , , MD        PHYSICAL EXAMINATION: ECOG PERFORMANCE STATUS: 1 - Symptomatic but completely ambulatory  Vitals:   02/21/20 0815  BP: 122/69  Pulse: (!) 115  Resp: 16  Temp: 98.9 F (37.2 C)  SpO2: 100%   Filed Weights   02/21/20 0815  Weight: 130 lb 9.6 oz (59.2 kg)    LABORATORY DATA:  I have reviewed the data as  listed CMP Latest Ref Rng & Units 02/14/2020 02/03/2020 02/02/2020  Glucose 70 - 99 mg/dL 103(H) 166(H) 148(H)  BUN 6 - 20 mg/dL <4(L) 10 <5(L)  Creatinine 0.44 - 1.00 mg/dL 0.64 0.49 0.47  Sodium 135 - 145 mmol/L 141 137 135  Potassium 3.5 - 5.1 mmol/L 4.3 5.0 3.9  Chloride 98 - 111 mmol/L 107 105 105  CO2 22 - 32 mmol/L 22 26 25  Calcium 8.9 - 10.3 mg/dL 8.7(L) 8.2(L) 7.1(L)  Total Protein 6.5 - 8.1 g/dL 7.6 6.9 6.5  Total Bilirubin 0.3 - 1.2 mg/dL 0.4 0.3 0.5  Alkaline Phos 38 - 126 U/L 113 92 80  AST 15 - 41 U/L 30 33 27  ALT 0 - 44 U/L 37 30 20    Lab Results  Component Value Date   WBC 3.7 (L) 02/21/2020   HGB 8.5 (L) 02/21/2020   HCT 26.1 (L) 02/21/2020   MCV 91.9 02/21/2020   PLT 36 (L) 02/21/2020   NEUTROABS 2.2 02/21/2020    ASSESSMENT & PLAN:  Malignant neoplasm of lower-outer quadrant of left breast of female, estrogen receptor negative (HCC) Left lumpectomy: IDC grade 3, 1.2 cm, DCIS is present, margins negative, 0/6 lymph nodes negative, ER 0%, PR 0%, HER-2 positive ratio 2.28, Ki-67 20%, T1 BN 0 stage IA S/P Taxol-Herceptin BRCA 2 Mutation Positive  10/20/19:Bone mets involving Right hip, Bil Iliac bones, T8, T9, L2 and L3, Rt prox humerus, Righ glenoid. Some lytic and some sclerotic. Mild Left supraclav adenopathy, 2 small lesions basal ganglia (could be lacunar infarcts)  Biopsy the supra clav LN: 10/31/2019: Metastatic breast cancer, ER/PR negative HER-2 equivocal by IHC, FISHpositive Treatmentsummary: Kadcyla every 3-week treatment.Discontinued for progression  MRI of the back 01/07/2020: Extensive metastatic disease to the involved spine. Tumor replacement of L3 vertebral body with pathologic compression fracture, epidural and extraosseous extension. Tumor extending throughout L3 foramen Status post palliative radiation therapy Causing severe dysphagia and dehydration and hospitalization  01/29/2020-02/03/2020 ---------------------------------------------------------------------------------------------------------------------------------------------------------- Echocardiogram 11/14/2019 Current treatment:Talazoparib started 02/07/2020 Toxicities: 1.  Thrombocytopenia: Platelet count 36.  We will hold talazoparib today.  Recheck labs in 1 week.  If the platelet count is greater than 75,000 then we can resume talazoparib.  If it is still below 75,000 then we will hold for another week and resume talazoparib at a lower dosage. 2.  Leukopenia: Stable 3.  Anemia: Hemoglobin 8.5 4.  Nausea when she starts to eat more heavier foods.  She will stick to soft diet and drink lots of water.  Mild nausea for couple of days last week resolved with antinausea medication. Return to clinic in 1 week for labs and follow-up with Lindsey.  No orders of the defined types were placed in this encounter.    The patient has a good understanding of the overall plan. she agrees with it. she will call with any problems that may develop before the next visit here.  Total time spent: 30 mins including face to face time and time spent for planning, charting and coordination of care  , , MD 02/21/2020  I, Molly Dorshimer, am acting as scribe for Dr.  .  I have reviewed the above documentation for accuracy and completeness, and I agree with the above.       

## 2020-02-21 ENCOUNTER — Other Ambulatory Visit: Payer: Self-pay

## 2020-02-21 ENCOUNTER — Telehealth: Payer: Self-pay | Admitting: Hematology and Oncology

## 2020-02-21 ENCOUNTER — Inpatient Hospital Stay (HOSPITAL_BASED_OUTPATIENT_CLINIC_OR_DEPARTMENT_OTHER): Payer: Medicaid Other | Admitting: Hematology and Oncology

## 2020-02-21 ENCOUNTER — Inpatient Hospital Stay: Payer: Medicaid Other

## 2020-02-21 DIAGNOSIS — C50512 Malignant neoplasm of lower-outer quadrant of left female breast: Secondary | ICD-10-CM | POA: Diagnosis not present

## 2020-02-21 DIAGNOSIS — C50919 Malignant neoplasm of unspecified site of unspecified female breast: Secondary | ICD-10-CM

## 2020-02-21 DIAGNOSIS — C5701 Malignant neoplasm of right fallopian tube: Secondary | ICD-10-CM | POA: Diagnosis not present

## 2020-02-21 DIAGNOSIS — Z171 Estrogen receptor negative status [ER-]: Secondary | ICD-10-CM | POA: Diagnosis not present

## 2020-02-21 LAB — CBC WITH DIFFERENTIAL (CANCER CENTER ONLY)
Abs Immature Granulocytes: 0.01 10*3/uL (ref 0.00–0.07)
Basophils Absolute: 0 10*3/uL (ref 0.0–0.1)
Basophils Relative: 0 %
Eosinophils Absolute: 0 10*3/uL (ref 0.0–0.5)
Eosinophils Relative: 1 %
HCT: 26.1 % — ABNORMAL LOW (ref 36.0–46.0)
Hemoglobin: 8.5 g/dL — ABNORMAL LOW (ref 12.0–15.0)
Immature Granulocytes: 0 %
Lymphocytes Relative: 32 %
Lymphs Abs: 1.2 10*3/uL (ref 0.7–4.0)
MCH: 29.9 pg (ref 26.0–34.0)
MCHC: 32.6 g/dL (ref 30.0–36.0)
MCV: 91.9 fL (ref 80.0–100.0)
Monocytes Absolute: 0.4 10*3/uL (ref 0.1–1.0)
Monocytes Relative: 10 %
Neutro Abs: 2.2 10*3/uL (ref 1.7–7.7)
Neutrophils Relative %: 57 %
Platelet Count: 36 10*3/uL — ABNORMAL LOW (ref 150–400)
RBC: 2.84 MIL/uL — ABNORMAL LOW (ref 3.87–5.11)
RDW: 19.4 % — ABNORMAL HIGH (ref 11.5–15.5)
WBC Count: 3.7 10*3/uL — ABNORMAL LOW (ref 4.0–10.5)
nRBC: 0 % (ref 0.0–0.2)

## 2020-02-21 LAB — CMP (CANCER CENTER ONLY)
ALT: 31 U/L (ref 0–44)
AST: 25 U/L (ref 15–41)
Albumin: 3.4 g/dL — ABNORMAL LOW (ref 3.5–5.0)
Alkaline Phosphatase: 131 U/L — ABNORMAL HIGH (ref 38–126)
Anion gap: 13 (ref 5–15)
BUN: 12 mg/dL (ref 6–20)
CO2: 21 mmol/L — ABNORMAL LOW (ref 22–32)
Calcium: 8.8 mg/dL — ABNORMAL LOW (ref 8.9–10.3)
Chloride: 105 mmol/L (ref 98–111)
Creatinine: 0.68 mg/dL (ref 0.44–1.00)
GFR, Est AFR Am: >60 mL/min (ref >60)
GFR, Estimated: >60 mL/min (ref >60)
Glucose, Bld: 107 mg/dL — ABNORMAL HIGH (ref 70–99)
Potassium: 3.4 mmol/L — ABNORMAL LOW (ref 3.5–5.1)
Sodium: 139 mmol/L (ref 135–145)
Total Bilirubin: 0.4 mg/dL (ref 0.3–1.2)
Total Protein: 8 g/dL (ref 6.5–8.1)

## 2020-02-21 NOTE — Telephone Encounter (Signed)
Scheduled appts per 6/8 los. Gave pt a print out of AVS.

## 2020-02-21 NOTE — Assessment & Plan Note (Signed)
Left lumpectomy: IDC grade 3, 1.2 cm, DCIS is present, margins negative, 0/6 lymph nodes negative, ER 0%, PR 0%, HER-2 positive ratio 2.28, Ki-67 20%, T1 BN 0 stage IA S/P Taxol-Herceptin BRCA 2 Mutation Positive  10/20/19:Bone mets involving Right hip, Bil Iliac bones, T8, T9, L2 and L3, Rt prox humerus, Righ glenoid. Some lytic and some sclerotic. Mild Left supraclav adenopathy, 2 small lesions basal ganglia (could be lacunar infarcts)  Biopsy the supra clav LN: 10/31/2019: Metastatic breast cancer, ER/PR negative HER-2 equivocal by IHC, FISHpositive Treatmentsummary: Kadcyla every 3-week treatment.Discontinued for progression  MRI of the back 01/07/2020: Extensive metastatic disease to the involved spine. Tumor replacement of L3 vertebral body with pathologic compression fracture, epidural and extraosseous extension. Tumor extending throughout L3 foramen Status post palliative radiation therapy Causing severe dysphagia and dehydration and hospitalization 01/29/2020-02/03/2020 ---------------------------------------------------------------------------------------------------------------------------------------------------------- Echocardiogram 11/14/2019 Current treatment:Talazoparib started 02/07/2020 Toxicities: 1.  Thrombocytopenia: If platelet counts drop below 50,000 then we will have to hold talazoparib and resume if platelets increased to more than 75,000. 2.  Leukopenia Patient does not experience any side effects to treatment so far.

## 2020-02-27 ENCOUNTER — Other Ambulatory Visit: Payer: Medicaid Other

## 2020-02-27 ENCOUNTER — Ambulatory Visit: Payer: Medicaid Other

## 2020-02-27 ENCOUNTER — Ambulatory Visit: Payer: Medicaid Other | Admitting: Hematology and Oncology

## 2020-02-27 ENCOUNTER — Other Ambulatory Visit: Payer: Self-pay | Admitting: Hematology and Oncology

## 2020-02-27 DIAGNOSIS — Z853 Personal history of malignant neoplasm of breast: Secondary | ICD-10-CM

## 2020-02-28 ENCOUNTER — Other Ambulatory Visit: Payer: Self-pay | Admitting: *Deleted

## 2020-02-28 ENCOUNTER — Inpatient Hospital Stay (HOSPITAL_BASED_OUTPATIENT_CLINIC_OR_DEPARTMENT_OTHER): Payer: Medicaid Other | Admitting: Adult Health

## 2020-02-28 ENCOUNTER — Inpatient Hospital Stay: Payer: Medicaid Other

## 2020-02-28 ENCOUNTER — Other Ambulatory Visit: Payer: Self-pay

## 2020-02-28 ENCOUNTER — Telehealth: Payer: Self-pay | Admitting: Adult Health

## 2020-02-28 VITALS — BP 123/63 | HR 100 | Temp 98.9°F | Resp 20 | Ht 59.0 in | Wt 133.7 lb

## 2020-02-28 DIAGNOSIS — C50512 Malignant neoplasm of lower-outer quadrant of left female breast: Secondary | ICD-10-CM

## 2020-02-28 DIAGNOSIS — Z171 Estrogen receptor negative status [ER-]: Secondary | ICD-10-CM

## 2020-02-28 DIAGNOSIS — C7951 Secondary malignant neoplasm of bone: Secondary | ICD-10-CM | POA: Diagnosis not present

## 2020-02-28 DIAGNOSIS — Z95828 Presence of other vascular implants and grafts: Secondary | ICD-10-CM

## 2020-02-28 DIAGNOSIS — C5701 Malignant neoplasm of right fallopian tube: Secondary | ICD-10-CM

## 2020-02-28 LAB — CMP (CANCER CENTER ONLY)
ALT: 18 U/L (ref 0–44)
AST: 19 U/L (ref 15–41)
Albumin: 3.3 g/dL — ABNORMAL LOW (ref 3.5–5.0)
Alkaline Phosphatase: 153 U/L — ABNORMAL HIGH (ref 38–126)
Anion gap: 10 (ref 5–15)
BUN: 14 mg/dL (ref 6–20)
CO2: 23 mmol/L (ref 22–32)
Calcium: 9.1 mg/dL (ref 8.9–10.3)
Chloride: 108 mmol/L (ref 98–111)
Creatinine: 0.64 mg/dL (ref 0.44–1.00)
GFR, Est AFR Am: >60 mL/min (ref >60)
GFR, Estimated: >60 mL/min (ref >60)
Glucose, Bld: 74 mg/dL (ref 70–99)
Potassium: 3.9 mmol/L (ref 3.5–5.1)
Sodium: 141 mmol/L (ref 135–145)
Total Bilirubin: 0.2 mg/dL — ABNORMAL LOW (ref 0.3–1.2)
Total Protein: 7.6 g/dL (ref 6.5–8.1)

## 2020-02-28 LAB — CBC WITH DIFFERENTIAL (CANCER CENTER ONLY)
Abs Immature Granulocytes: 0.04 10*3/uL (ref 0.00–0.07)
Basophils Absolute: 0 10*3/uL (ref 0.0–0.1)
Basophils Relative: 0 %
Eosinophils Absolute: 0 10*3/uL (ref 0.0–0.5)
Eosinophils Relative: 0 %
HCT: 27 % — ABNORMAL LOW (ref 36.0–46.0)
Hemoglobin: 8.5 g/dL — ABNORMAL LOW (ref 12.0–15.0)
Immature Granulocytes: 1 %
Lymphocytes Relative: 29 %
Lymphs Abs: 1.4 10*3/uL (ref 0.7–4.0)
MCH: 31.1 pg (ref 26.0–34.0)
MCHC: 31.5 g/dL (ref 30.0–36.0)
MCV: 98.9 fL (ref 80.0–100.0)
Monocytes Absolute: 0.8 10*3/uL (ref 0.1–1.0)
Monocytes Relative: 16 %
Neutro Abs: 2.6 10*3/uL (ref 1.7–7.7)
Neutrophils Relative %: 54 %
Platelet Count: 78 10*3/uL — ABNORMAL LOW (ref 150–400)
RBC: 2.73 MIL/uL — ABNORMAL LOW (ref 3.87–5.11)
RDW: 23.1 % — ABNORMAL HIGH (ref 11.5–15.5)
WBC Count: 4.9 10*3/uL (ref 4.0–10.5)
nRBC: 0.4 % — ABNORMAL HIGH (ref 0.0–0.2)

## 2020-02-28 MED ORDER — HEPARIN SOD (PORK) LOCK FLUSH 100 UNIT/ML IV SOLN
500.0000 [IU] | Freq: Once | INTRAVENOUS | Status: AC | PRN
  Administered 2020-02-28: 500 [IU]
  Filled 2020-02-28: qty 5

## 2020-02-28 MED ORDER — SODIUM CHLORIDE 0.9% FLUSH
10.0000 mL | INTRAVENOUS | Status: DC | PRN
  Administered 2020-02-28: 10 mL
  Filled 2020-02-28: qty 10

## 2020-02-28 NOTE — Assessment & Plan Note (Addendum)
Left lumpectomy: IDC grade 3, 1.2 cm, DCIS is present, margins negative, 0/6 lymph nodes negative, ER 0%, PR 0%, HER-2 positive ratio 2.28, Ki-67 20%, T1 BN 0 stage IA °S/P Taxol-Herceptin °BRCA 2 Mutation Positive °  °10/20/19: Bone mets involving Right hip, Bil Iliac bones, T8, T9, L2 and L3, Rt prox humerus, Righ glenoid. Some lytic and some sclerotic. Mild Left supraclav adenopathy, 2 small lesions basal ganglia (could be lacunar infarcts) °  °Biopsy the supra clav LN: 10/31/2019: Metastatic breast cancer, ER/PR negative HER-2 equivocal by IHC, FISH positive °Treatment summary: Kadcyla every 3-week treatment.  Discontinued for progression °  °MRI of the back 01/07/2020: Extensive metastatic disease to the involved spine.  Tumor replacement of L3 vertebral body with pathologic compression fracture, epidural and extraosseous extension.  Tumor extending throughout L3 foramen °Status post palliative radiation therapy Causing severe dysphagia and dehydration and hospitalization 01/29/2020-02/03/2020 °---------------------------------------------------------------------------------------------------------------------------------------------------------- °Echocardiogram 11/14/2019 °Current treatment: Talazoparib started 02/07/2020 °Toxicities: °Erin [name] is fatigued from her previous treatment.  She has been off of the Talazoparib for one week.  I reviewed with her the guidelines for restarting treatment, which includes a dose reduction from her treatment.  She does not want to start back at full dose as she was before.  Instead, she will restart at 1mg every other day and will f/u with Dr. Gudena in one week for labs and to discuss dosages.   ° °I placed orders for repeat CT chest.  This will take place in a few weeks to give her left supraclavicular swelling an opportunity to respond to the treatment.   ° °We will see her back next week.   °

## 2020-02-28 NOTE — Progress Notes (Signed)
[facility] Follow up:    Antony Blackbird, MD [place] [address]   DIAGNOSIS: Cancer Staging Fallopian tube cancer, carcinoma, right [facility]) Staging form: Ovary, Fallopian Tube, and Primary Peritoneal Carcinoma, AJCC 8th Edition - Clinical stage from 11/04/2017: FIGO Stage I, calculated as Stage Unknown (cT1c2, cNX, cM0) - Signed by [name], MD on 11/22/2017  Malignant neoplasm of lower-outer quadrant of left breast of female, estrogen receptor negative ([place]) Staging form: Breast, AJCC 8th Edition - Clinical stage from 05/18/2017: Stage IA (cT1b, cN0, cM0, G3, ER: Negative, PR: Negative, HER2: Positive) - Unsigned - Pathologic: Stage IA (pT1c, pN0, cM0, G3, ER: Negative, PR: Negative, HER2: Positive) - Signed by [name], NP on 07/07/2017   SUMMARY OF ONCOLOGIC HISTORY: Oncology History  Malignant neoplasm of lower-outer quadrant of left breast of female, estrogen receptor negative ([place])  05/11/2017 Initial Diagnosis   Left breast biopsy 3:30 position 6 cm from nipple: IDC with DCIS, lymphovascular invasion present, grade 2-3, ER 0%, PR 0%, HER-2 positive ratio 2.28, Ki-67 20%, 1 cm lesion left breast, T1b N0 stage IA clinical stage   06/09/2017 Surgery   Left lumpectomy: IDC grade 3, 1.2 cm, DCIS is present, margins negative, 0/6 lymph nodes negative, ER 0%, PR 0%, HER-2 positive ratio 2.28, Ki-67 20%, T1 BN 0 stage IA   07/09/2017 - 09/29/2017 Adjuvant Chemotherapy   Taxol/Herceptin x 12 completed on 09/29/2017, went on to receive every 3 week adjuvant Herceptin through 06/28/2018   11/25/2017 - 01/07/2018 Radiation Therapy    The patient initially received a dose of 50.4 Gy in 28 fractions to the breast using whole-breast tangent fields. This was delivered using a 3-D conformal technique. The patient then received a boost to the seroma. This delivered an additional 10 Gy in 5 fractions using a 3-D technique. The total dose was 60.4  Gy.   02/15/2018 Genetic Testing   The Common Hereditary Cancer Panel offered by [facility] includes sequencing and/or deletion duplication testing of the following 47 genes: APC, ATM, AXIN2, BARD1, BMPR1A, BRCA1, BRCA2, BRIP1, CDH1, CDKN2A (p14ARF), CDKN2A (p16INK4a), CKD4, CHEK2, CTNNA1, DICER1, EPCAM (Deletion/duplication testing only), GREM1 (promoter region deletion/duplication testing only), KIT, MEN1, MLH1, MSH2, MSH3, MSH6, MUTYH, NBN, NF1, NHTL1, PALB2, PDGFRA, PMS2, POLD1, POLE, PTEN, RAD50, RAD51C, RAD51D, SDHB, SDHC, SDHD, SMAD4, SMARCA4. STK11, TP53, TSC1, TSC2, and VHL.  The following genes were evaluated for sequence changes only: SDHA and HOXB13 c.251G>A variant only.   Results: POSITIVE. 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 The date of this test report is 02/15/2018   10/20/2019 PET scan   Bone mets involving Right hip, Bil Iliac bones, T8, T9, L2 and L3, Rt prox humerus, Righ glenoid. Some lytic and some sclerotic. Mild Left supraclav adenopathy, 2 small lesions basal ganglia (could be lacunar infarcts)   11/14/2019 - 01/15/2020 Chemotherapy   The patient had palonosetron (ALOXI) injection 0.25 mg, 0.25 mg, Intravenous,  Once, 2 of 5 cycles Administration: 0.25 mg (12/05/2019), 0.25 mg (12/26/2019) ado-trastuzumab emtansine (KADCYLA) 260 mg in sodium chloride 0.9 % 250 mL chemo infusion, 3.6 mg/kg = 260 mg, Intravenous, Once, 3 of 6 cycles Administration: 260 mg (11/14/2019), 260 mg (12/05/2019), 260 mg (12/26/2019)  for chemotherapy treatment.    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  Required TPN briefly.  Patient was able to swallow and keep  food down by the time of discharge.   02/06/2020 Miscellaneous   Talazoparib   Fallopian tube cancer, carcinoma, right ([place])  11/04/2017 Surgery   Uterus, cervix and bilateral  fallopian tubes: Fallopian tube with serous carcinoma 0.6 cm positive for p53, PAX8, WT-1, MOC-31, cytokeratin 7, and estrogen receptor   01/11/2018 -  Chemotherapy   Taxol and carboplatin every 3 weeks (Taxol discontinued with cycle 2 because of profound rash)   10/20/2019 PET scan   Bone mets involving Right hip, Bil Iliac bones, T8, T9, L2 and L3, Rt prox humerus, Righ glenoid. Some lytic and some sclerotic. Mild Left supraclav adenopathy, 2 small lesions basal ganglia (could be lacunar infarcts)   11/14/2019 - 01/15/2020 Chemotherapy   The patient had palonosetron (ALOXI) injection 0.25 mg, 0.25 mg, Intravenous,  Once, 2 of 5 cycles Administration: 0.25 mg (12/05/2019), 0.25 mg (12/26/2019) ado-trastuzumab emtansine (KADCYLA) 260 mg in sodium chloride 0.9 % 250 mL chemo infusion, 3.6 mg/kg = 260 mg, Intravenous, Once, 3 of 6 cycles Administration: 260 mg (11/14/2019), 260 mg (12/05/2019), 260 mg (12/26/2019)  for chemotherapy treatment.      CURRENT THERAPY:Talazoparib  INTERVAL HISTORY: PEGGI YONO 59 y.o. female returns today accompanied by a sign language interpreter and her mother for evaluation of her metastatic breast cancer.  She is doing moderately well today.  She was seen by Dr. [name] last week and her platelet count was decreased to 38k.  Her Talazoparib was held x 1 week and she is here today for repeat counts to see if she can resume treatment.  She does note that over the past two weeks she has had an increased amount of swelling in her left supraclavicular area which is an area where her metastatic cancer is.  She wants me to look at this today.     Patient Active Problem List   Diagnosis Date Noted  . Burning in the chest   . Vomiting   . Dysphagia 01/29/2020  . 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  . Esophageal candidiasis ([place]) 01/29/2020  . Bone metastases ([place]) 12/28/2019  . Pain from bone metastases ([place]) 12/28/2019  . Goals of care, counseling/discussion  11/07/2019  . Secondary malignant neoplasm of supraclavicular lymph nodes ([place]) 11/03/2019  . Secondary malignant neoplasm of lumbar vertebral column ([place]) 11/03/2019  . 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[place]) 11/03/2019  . Elevated CA-125 10/09/2019  . Paresthesia 04/17/2019  . BRCA2 gene mutation positive 02/17/2018  . Genetic testing 02/17/2018  . Family history of breast cancer   . Family history of colon cancer   . Fallopian tube cancer, carcinoma, right ([place]) 11/22/2017  . S/P vaginal hysterectomy 11/04/2017  . Ovarian mass, left 08/26/2017  . Menorrhagia 08/26/2017  . Port-A-Cath in place 07/23/2017  . Malignant neoplasm of lower-outer quadrant of left breast of female, estrogen receptor negative ([place]) 05/13/2017  . Abdominal bloating 04/02/2017  . Chronic right-sided low back pain without sciatica 07/02/2016  . DM type 2 (diabetes mellitus, type 2) ([place]) 11/18/2014  . Intermittent constipation 06/06/2010  . UNSPECIFIED IRON DEFICIENCY ANEMIA 04/22/2010  . DEPRESSION 04/10/2010  . Hearing loss 04/10/2010    has No Known Allergies.  MEDICAL HISTORY: Past Medical History:  Diagnosis Date  . Arthritis    knees, elbows  . Borderline glaucoma of right eye   . BRCA2 gene mutation positive 02/17/2018   BRCA2 c.4552del (p.Glu1518Asnfs*25)  Result reported out on 02/15/2018.   . Breast cancer ([place]) 05/2017   left  . Chronic back pain   .  Deaf    per pt born hearing and at age 80 1/2 lost hearing , was told by mother unknown cause, can miminally hear in left and no hearing on right   . Depression   . Dyspnea    states some SOB with ADLs and anemia   . Elevated cancer antigen 125 (CA 125)   . Family history of breast cancer   . Family history of breast cancer   . Family history of colon cancer   . Genetic testing 02/17/2018   The Common Hereditary Cancer Panel offered by [facility] includes sequencing and/or deletion duplication testing of the following 47 genes: APC, ATM,  AXIN2, BARD1, BMPR1A, BRCA1, BRCA2, BRIP1, CDH1, CDKN2A (p14ARF), CDKN2A (p16INK4a), CKD4, CHEK2, CTNNA1, DICER1, EPCAM (Deletion/duplication testing only), GREM1 (promoter region deletion/duplication testing only), KIT, MEN1, MLH1, MSH2, MSH3, MSH6, MU  . History of cancer chemotherapy    left breast cancer 09-08-2017 to 10-09-2017;  fallopion tube cancer  12-15-2017  to 06-28-2018  . 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 History of external beam radiation therapy    left breast  11-25-2017  to 01-07-2018  . Hyperlipidemia   . Hypertension    followed by pcp   (10-05-2019  per pt never had stress test)  . IDA (iron deficiency anemia)   . Malignant neoplasm of lower-outer quadrant of left breast of female, estrogen receptor negative [facility]) oncologist-- dr [name]   dx 08/ 2018--- Stage IA, DCIS,  ER/ PR negative,  HER-2 positive;  06-09-2017 s/p left breast lumpectomy with node dissection;  completed chemo 10-09-2017  and radiation 01-07-2018/  hercepton completed 06-28-2018  . Non-insulin dependent type 2 diabetes mellitus ([place])    followed by pcp   (10-05-2019 per pt check cbg every other day in AM,  fasting cbg-- 105)  . Numbness of right thumb   . Wears glasses     SURGICAL HISTORY: Past Surgical History:  Procedure Laterality Date  . BREAST LUMPECTOMY WITH RADIOACTIVE SEED AND SENTINEL LYMPH NODE BIOPSY Left 06/09/2017   Procedure: BREAST LUMPECTOMY WITH RADIOACTIVE SEED AND SENTINEL LYMPH NODE BIOPSY;  Surgeon: Excell Seltzer, MD;  Location: [facility];  Service: General;  Laterality: Left;  . COLONOSCOPY  11/02/2017   polyps  . HERNIA REPAIR    . HIATAL HERNIA REPAIR  09/2019  . LAPAROSCOPIC ASSISTED VAGINAL HYSTERECTOMY N/A 11/04/2017   Procedure: LAPAROSCOPIC ASSISTED VAGINAL HYSTERECTOMY;   Surgeon: [name], MD;  Location: [facility];  Service: Gynecology;  Laterality: N/A;  . LAPAROSCOPIC BILATERAL SALPINGO OOPHERECTOMY Bilateral 11/04/2017   Procedure: LAPAROSCOPIC BILATERAL SALPINGO OOPHORECTOMY;  Surgeon: [name], MD;  Location: [facility];  Service: Gynecology;  Laterality: Bilateral;  . LAPAROSCOPY N/A 10/09/2019   Procedure: LAPAROSCOPY DIAGNOSTIC WITH PERITONEAL BIOPSIES;  Surgeon: [name], MD;  Location: [facility];  Service: Gynecology;  Laterality: N/A;  . PORTACATH PLACEMENT Right 06/09/2017   Procedure: INSERTION PORT-A-CATH WITH [place];  Surgeon: Excell Seltzer, MD;  Location: [facility];  Service: General;  Laterality: Right;  . PORTACATH PLACEMENT Right 11/13/2019   Procedure: INSERTION PORT-A-CATH WITH ULTRASOUND GUIDANCE;  Surgeon: Rolm Bookbinder, MD;  Location: [facility];  Service: General;  Laterality: Right;  . TUBAL LIGATION  02/02/2002   @[facility]    PPTL  .  UPPER GI ENDOSCOPY      SOCIAL HISTORY: Social History   Socioeconomic History  . Marital status: Legally Separated    Spouse name: Not on file  . Number of children: 2  . Years of education: Not on file  . Highest education level: Not on file  Occupational History  . Not on file  Tobacco Use  . Smoking status: Never Smoker  . Smokeless tobacco: Never Used  Vaping Use  . Vaping Use: Never used  Substance and Sexual Activity  . Alcohol use: No  . Drug use: Never  . Sexual activity: Not on file  Other Topics Concern  . Not on file  Social History Narrative   1 boy and 1 girl   Resides in [place] Determinants of Health   Financial Resource Strain:   . Difficulty of Paying Living Expenses:   Food Insecurity:   . Worried About Charity fundraiser in the Last Year:   . Arboriculturist in the Last Year:   Transportation Needs:   . Film/video editor (Medical):   Marland Kitchen Lack of Transportation (Non-Medical):    Physical Activity:   . Days of Exercise per Week:   . Minutes of Exercise per Session:   Stress:   . Feeling of Stress :   Social Connections:   . Frequency of Communication with Friends and Family:   . Frequency of Social Gatherings with Friends and Family:   . Attends Religious Services:   . Active Member of Clubs or Organizations:   . Attends Archivist Meetings:   Marland Kitchen Marital Status:   Intimate Partner Violence:   . Fear of Current or Ex-Partner:   . Emotionally Abused:   Marland Kitchen Physically Abused:   . Sexually Abused:     FAMILY HISTORY: Family History  Problem Relation Age of Onset  . Hypertension Mother   . Lupus Mother   . Diabetes Father   . Hypertension Sister   . Diabetes Paternal Grandmother   . Colon cancer Paternal Grandmother 42  . CAD Brother   . Diabetes Brother   . Heart attack Brother   . Breast cancer Maternal Aunt        dx >50  . Heart attack Paternal Grandfather   . Breast cancer Maternal Aunt        dx under 51  . Breast cancer Maternal Aunt        dx  under 50    Review of Systems  Constitutional: Positive for fatigue. Negative for appetite change, chills, fever and unexpected weight change.  HENT:   Negative for hearing loss, lump/mass and sore throat.   Eyes: Negative for eye problems and icterus.  Respiratory: Negative for chest tightness, cough and shortness of breath.   Cardiovascular: Negative for chest pain, leg swelling and palpitations.  Gastrointestinal: Negative for abdominal distention, abdominal pain, constipation, diarrhea, nausea and vomiting.  Endocrine: Negative for hot flashes.  Genitourinary: Negative for difficulty urinating.   Musculoskeletal: Negative for arthralgias.  Skin: Negative for itching and rash.  Neurological: Negative for dizziness, extremity weakness, headaches and numbness.  Hematological: Negative for adenopathy. Does not bruise/bleed easily.  Psychiatric/Behavioral: The patient is not  nervous/anxious.       PHYSICAL EXAMINATION  ECOG PERFORMANCE STATUS: 1 - Symptomatic but completely ambulatory  Vitals:   02/28/20 1154  BP: 123/63  Pulse: 100  Resp: 20  Temp: 98.9 F (37.2 C)  SpO2: 100%  Physical Exam Constitutional:      General: She is not in acute distress.    Appearance: Normal appearance. She is not toxic-appearing.  HENT:     Head: Normocephalic and atraumatic.  Eyes:     General: No scleral icterus. Neck:     Comments: Left supraclavicular swelling noted  Cardiovascular:     Rate and Rhythm: Normal rate and regular rhythm.     Pulses: Normal pulses.     Heart sounds: Normal heart sounds.  Pulmonary:     Effort: Pulmonary effort is normal.     Breath sounds: Normal breath sounds.  Abdominal:     General: Abdomen is flat. There is no distension.     Palpations: Abdomen is soft.     Tenderness: There is no abdominal tenderness.  Musculoskeletal:     Cervical back: Neck supple.  Skin:    General: Skin is warm and dry.     Findings: No rash.  Neurological:     General: No focal deficit present.     Mental Status: She is alert and oriented to person, place, and time.  Psychiatric:        Mood and Affect: Mood normal.        Behavior: Behavior normal.     LABORATORY DATA:  CBC    Component Value Date/Time   WBC 4.9 02/28/2020 1143   WBC 3.4 (L) 02/03/2020 0645   RBC 2.73 (L) 02/28/2020 1143   HGB 8.5 (L) 02/28/2020 1143   HGB 9.3 (L) 09/15/2017 0800   HCT 27.0 (L) 02/28/2020 1143   HCT 28.2 (L) 09/15/2017 0800   PLT 78 (L) 02/28/2020 1143   PLT 357 09/15/2017 0800   PLT 375 05/26/2017 0918   MCV 98.9 02/28/2020 1143   MCV 82.4 09/15/2017 0800   MCH 31.1 02/28/2020 1143   MCHC 31.5 02/28/2020 1143   RDW 23.1 (H) 02/28/2020 1143   RDW 20.8 (H) 09/15/2017 0800   LYMPHSABS 1.4 02/28/2020 1143   LYMPHSABS 2.5 09/15/2017 0800   MONOABS 0.8 02/28/2020 1143   MONOABS 0.8 09/15/2017 0800   EOSABS 0.0 02/28/2020 1143    EOSABS 0.1 09/15/2017 0800   EOSABS 0.1 05/26/2017 0918   BASOSABS 0.0 02/28/2020 1143   BASOSABS 0.0 09/15/2017 0800    CMP     Component Value Date/Time   NA 141 02/28/2020 1143   NA 143 04/06/2019 1211   NA 139 09/15/2017 0800   K 3.9 02/28/2020 1143   K 3.8 09/15/2017 0800   CL 108 02/28/2020 1143   CO2 23 02/28/2020 1143   CO2 23 09/15/2017 0800   GLUCOSE 74 02/28/2020 1143   GLUCOSE 113 09/15/2017 0800   BUN 14 02/28/2020 1143   BUN 9 04/06/2019 1211   BUN 8.7 09/15/2017 0800   CREATININE 0.64 02/28/2020 1143   CREATININE 0.7 09/15/2017 0800   CALCIUM 9.1 02/28/2020 1143   CALCIUM 9.2 09/15/2017 0800   PROT 7.6 02/28/2020 1143   PROT 7.5 04/06/2019 1211   PROT 7.3 09/15/2017 0800   ALBUMIN 3.3 (L) 02/28/2020 1143   ALBUMIN 4.3 04/06/2019 1211   ALBUMIN 3.3 (L) 09/15/2017 0800   AST 19 02/28/2020 1143   AST 13 09/15/2017 0800   ALT 18 02/28/2020 1143   ALT 19 09/15/2017 0800   ALKPHOS 153 (H) 02/28/2020 1143   ALKPHOS 74 09/15/2017 0800   BILITOT 0.2 (L) 02/28/2020 1143   BILITOT 0.27 09/15/2017 0800   GFRNONAA >60 02/28/2020 1143   GFRAA >  60 02/28/2020 1143      ASSESSMENT and THERAPY PLAN:   Malignant neoplasm of lower-outer quadrant of left breast of female, estrogen receptor negative ([facility]) Left lumpectomy: IDC grade 3, 1.2 cm, DCIS is present, margins negative, 0/6 lymph nodes negative, ER 0%, PR 0%, HER-2 positive ratio 2.28, Ki-67 20%, T1 BN 0 stage IA S/P Taxol-Herceptin BRCA 2 Mutation Positive  10/20/19:Bone mets involving Right hip, Bil Iliac bones, T8, T9, L2 and L3, Rt prox humerus, Righ glenoid. Some lytic and some sclerotic. Mild Left supraclav adenopathy, 2 small lesions basal ganglia (could be lacunar infarcts)  Biopsy the supra clav LN: 10/31/2019: Metastatic breast cancer, ER/PR negative HER-2 equivocal by IHC, FISHpositive Treatmentsummary: Kadcyla every 3-week treatment.Discontinued for progression  MRI of the back 01/07/2020:  Extensive metastatic disease to the involved spine. Tumor replacement of L3 vertebral body with pathologic compression fracture, epidural and extraosseous extension. Tumor extending throughout L3 foramen Status post palliative radiation therapy Causing severe dysphagia and dehydration and hospitalization 01/29/2020-02/03/2020 ---------------------------------------------------------------------------------------------------------------------------------------------------------- Echocardiogram 11/14/2019 Current treatment:Talazoparib started 02/07/2020 Toxicities: Zamariya is fatigued from her previous treatment.  She has been off of the Talazoparib for one week.  I reviewed with her the guidelines for restarting treatment, which includes a dose reduction from her treatment.  She does not want to start back at full dose as she was before.  Instead, she will restart at 30m every other day and will f/u with Dr. GLindi Adiein one week for labs and to discuss dosages.    I placed orders for repeat CT chest.  This will take place in a few weeks to give her left supraclavicular swelling an opportunity to respond to the treatment.    We will see her back next week.     \  All questions were answered. The patient knows to call the clinic with any problems, questions or concerns. We can certainly see the patient much sooner if necessary.  Total encounter time: 30 minutes*  LWilber Bihari NP 02/29/20 10:03 PM Medical Oncology and H[facility] [id number]. [phone]   Fax. [phone]  *Total Encounter Time as defined by the Centers for Medicare and Medicaid Services includes, in addition to the face-to-face time of a patient visit (documented in the note above) non-face-to-face time: obtaining and reviewing outside history, ordering and reviewing medications, tests or procedures, care coordination (communications with other health care  professionals or caregivers) and documentation in the medical record.

## 2020-02-28 NOTE — Telephone Encounter (Signed)
Scheduled appts per 6/16 los. Appt was confirmed by pt through the interpreter.

## 2020-02-28 NOTE — Progress Notes (Signed)
Patient ID: Erin [name], female   DOB: [birth year], 59 y.o.   MRN: [id number]     Erin [name], is a 59 y.o. female  ENI:[id number]  IRW:[id number]  DOB - 1960/10/10  Subjective:  Chief Complaint and HPI: Erin [name] is a 59 y.o. female here today for a follow up visit After hospitalization 5/17-5/22 for radiation esophagitis and vomiting.  She is feeling better.  Appetite is better.  Swallowing is better.  No urinary s/sx.  No fevers.  Energy levels steadily improving.  She is feeling good overall.    ASL assisting with interpreting  From discharge summary: Recommendations for Outpatient Follow-up:  1. Follow outpatient CBC/CMP 2. Follow tolerance of PO with medical management of esophagitis  3. She's to start talazoparib at discharge 4. Follow with oncology outpatient   Discharge Diagnoses:  Active Problems:   Hearing loss   DM type 2 (diabetes mellitus, type 2) (HCC)   Fallopian tube cancer, carcinoma, right (HCC)   Bone metastases (HCC)   Dysphagia   Radiation-induced esophagitis   Esophageal candidiasis (HCC)   Burning in the chest   Vomiting  History of present illness:  40 year oldBFPMHxDM type II controlled without complication, breast cancer,metastatic fallopian tube carcinoma,with metastasis to bone/spinethat is post TAH/BSO, S/Pchemotherapy and radiationis here for follow-up visit accompanied by her mother and sign language interpreter for dysphagia and odynophagia Most recent palliative radiation to spinal cord April 20 to Jan 19, 2020  She is havingsignificant difficulty swallowing post radiation, receiving IV fluids dailyat cancer center. She is only drinking alkaline water but otherwise has not been able to eat anything. Per her mother she does not like to drink any sugary juices, boost breeze, Gatorade, Pedialyte, Jell-O or even popsicles. States they are too sweet and makes her nauseous. She has not tried any broth or clear soups.  Her  chest hurts when she tries to eat anything and sometimes even with saliva,she feels the chest hurts all the time. No significant improvement with Carafate tablets, she dissolves the tablets prior to drinking it as a slurry.  Oncologic history positive for breast cancer s/p lumpectomy, adjuvant chemotherapy and radiation therapy  Colonoscopy November 02, 2017: Removal of 2 small sessile polyps [tubular adenoma X1 and hyperplastic polypX1], internal hemorrhoids otherwise normal exam  She was admitted with dysphagia/odynophagia in the setting of radiation esophagitis and presumed esophageal candidiasis.  She's improved with IV PPI, fluconazole, magic mouthwash and antiemetics.  Initially received TPN, but she was tolerating diet on the day of discharge and this was discontinued at discharge.  Hospital Course:  RadiationInduced Esophagitis  Presumed Esophageal Candidiasis -GI c/s, appreciate assistance-> recommending empiric treatment for possible esophageal candidiasis, high dose PPI, magic mouthwash, antiemetics prn - planning to hold off on EGD for now  - Esophagram negative for geode stricture or mass, no mucosal edema or ulceration - Transition protonix to PO, magic mouthwash, prn antiemetics, continue PO fluconazole (QTc appropriate today prior to discharge - some issues with EKG being transmitted/printed, QTc~450) - she's doing well with PO today, will d/c without TPN, discontinue picc  Metastatic Breast Cancer with Bone Metastases  FallopianTube Cancer:  Followed by Dr. [name] Plan to start talazoparib at discharge  Protein calorie malnutrition - doing better with PO intake at discharge, can discharge without TPN  Electrolyte Abnormalities  Hypokalemia  Hypomagnesemia  Hypophosphatemia -improved  DM type II controlled without complication -76/1/[id number]=3.3 -A1c 5.8 -Lipid panel -> HDL 18 -resume home regiment  Deaf -Stable - interpreter used for  interactions  Thrombocytopenia: continue to monitor  Procedures: none   ED/Hospital notes reviewed.   Social History:  Deaf;  Not working   ROS:   Constitutional:  No f/c, No night sweats, No unexplained weight loss. EENT:  No vision changes, No blurry vision, No new hearing changes. No mouth, throat, or ear problems.  Respiratory: No cough, No SOB Cardiac: No CP, no palpitations GI:  No abd pain, No N/V/D. GU: No Urinary s/sx Musculoskeletal: No joint pain Neuro: No headache, no dizziness, no motor weakness.  Skin: No rash Endocrine:  No polydipsia. No polyuria.  Psych: Denies SI/HI  No problems updated.  ALLERGIES: No Known Allergies  PAST MEDICAL HISTORY: Past Medical History:  Diagnosis Date  . Arthritis    knees, elbows  . Borderline glaucoma of right eye   . BRCA2 gene mutation positive 02/17/2018   BRCA2 c.4552del (p.[id number]*25)  Result reported out on 02/15/2018.   . Breast cancer ([place]) 05/2017   left  . Chronic back pain   . Deaf    per pt born hearing and at age 1 1/2 lost hearing , was told by mother unknown cause, can miminally hear in left and no hearing on right   . Depression   . Dyspnea    states some SOB with ADLs and anemia   . Elevated cancer antigen 125 (CA 125)   . Family history of breast cancer   . Family history of breast cancer   . Family history of colon cancer   . Genetic testing 02/17/2018   The Common Hereditary Cancer Panel offered by [facility] includes sequencing and/or deletion duplication testing of the following 47 genes: APC, ATM, AXIN2, BARD1, BMPR1A, BRCA1, BRCA2, BRIP1, CDH1, CDKN2A (p14ARF), CDKN2A (p16INK4a), CKD4, CHEK2, CTNNA1, DICER1, EPCAM (Deletion/duplication testing only), GREM1 (promoter region deletion/duplication testing only), KIT, MEN1, MLH1, MSH2, MSH3, MSH6, MU  . History of cancer chemotherapy    left breast cancer 09-08-2017 to 10-09-2017;  fallopion tube cancer  12-15-2017  to 06-28-2018  . History of cancer  of fallopian tube in adulthood oncologist-  dr [name]   11-04-2017  s/p  LAVH w/ BSO,  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;  completed chemo 06-28-2018  . History of external beam radiation therapy    left breast  11-25-2017  to 01-07-2018  . Hyperlipidemia   . Hypertension    followed by pcp   (10-05-2019  per pt never had stress test)  . IDA (iron deficiency anemia)   . Malignant neoplasm of lower-outer quadrant of left breast of female, estrogen receptor negative [facility]) oncologist-- dr [name]   dx 08/ 2018--- Stage IA, DCIS,  ER/ PR negative,  HER-2 positive;  06-09-2017 s/p left breast lumpectomy with node dissection;  completed chemo 10-09-2017  and radiation 01-07-2018/  hercepton completed 06-28-2018  . Non-insulin dependent type 2 diabetes mellitus ([place])    followed by pcp   (10-05-2019 per pt check cbg every other day in AM,  fasting cbg-- 105)  . Numbness of right thumb   . Wears glasses     MEDICATIONS AT HOME: Prior to Admission medications   Medication Sig Start Date End Date Taking? Authorizing Provider  Accu-Chek FastClix Lancets MISC Use as instructed. Inject into the skin twice daily 12/26/18  Yes [name], NP  Blood Glucose Calibration (ACCU-CHEK GUIDE CONTROL) LIQD 1 each by In Vitro route once as needed for up to 1 dose. 12/26/18  Yes Raul Del,  [name], NP  cholecalciferol (VITAMIN D) 1000 units tablet Take 1,000 Units by mouth daily.   Yes [provider]  glimepiride (AMARYL) 2 MG tablet Take 1 tablet (2 mg total) by mouth daily with breakfast. To lower blood sugar 08/04/19  Yes Fulp, Cammie, MD  glucose blood (ACCU-CHEK GUIDE) test strip Use as instructed. Check blood glucose by fingerstick twice per day. 12/26/18  Yes [name], NP  Lancet Devices [facility]) lancets Use as instructed 12/17/14  Yes Lance Bosch, NP  metFORMIN (GLUCOPHAGE) 500 MG tablet Take 2 tablets (1,000 mg total) by mouth 2  (two) times daily with a meal. 02/03/20 03/04/20 Yes Elodia Florence., MD  pantoprazole (PROTONIX) 40 MG tablet Take 1 tablet (40 mg total) by mouth 2 (two) times daily. 02/03/20 03/04/20 Yes Elodia Florence., MD  talazoparib tosylate (TALZENNA) 1 MG capsule Take 1 capsule (1 mg total) by mouth daily. 01/08/20  Yes [name], MD  atorvastatin (LIPITOR) 40 MG tablet Take 1 tablet (40 mg total) by mouth daily. 02/29/20   Argentina Donovan, PA-C  ferrous sulfate 325 (65 FE) MG tablet TAKE 1 TABLET (325 MG TOTAL) BY MOUTH DAILY BEFORE BREAKFAST. Patient not taking: Reported on 02/29/2020 12/19/19 03/18/20  Fulp, Ander Gaster, MD  ondansetron (ZOFRAN ODT) 4 MG disintegrating tablet Take 1 tablet (4 mg total) by mouth every 8 (eight) hours as needed for nausea or vomiting. Patient not taking: Reported on 02/29/2020 01/29/20   Mauri Pole, MD     Objective:  EXAM:   Vitals:   02/29/20 0958  BP: (!) 100/59  Pulse: 100  Temp: 97.7 F (36.5 C)  TempSrc: Temporal  SpO2: 98%  Weight: 133 lb (60.3 kg)  Height: '5\' 1"'$  (1.549 m)    General appearance : A&OX3. NAD. Non-toxic-appearing HEENT: Atraumatic and Normocephalic.  PERRLA. EOM intact.  Chest/Lungs:  Breathing-non-labored, Good air entry bilaterally, breath sounds normal without rales, rhonchi, or wheezing  CVS: S1 S2 regular, no murmurs, gallops, rubs  Extremities: Bilateral Lower Ext shows no edema, both legs are warm to touch with = pulse throughout Neurology:  CN II-XII grossly intact, Non focal.   Psych:  TP linear. J/I WNL. Normal speech. Appropriate eye contact and affect.  Skin:  No Rash  Data Review Lab Results  Component Value Date   HGBA1C 5.9 (H) 01/31/2020   HGBA1C 5.8 (H) 01/30/2020   HGBA1C 6.3 (A) 07/19/2019     Assessment & Plan   1. Type 2 diabetes mellitus without complication, without long-term current use of insulin (HCC) Controlled;  Continue current regimen.  Drink 80-100 ounces water daily.   - Glucose  (CBG) - Comprehensive metabolic panel  2. Hyperlipidemia associated with type 2 diabetes mellitus (HCC) - Comprehensive metabolic panel - atorvastatin (LIPITOR) 40 MG tablet; Take 1 tablet (40 mg total) by mouth daily.  Dispense: 90 tablet; Refill: 1  3. Iron deficiency anemia, unspecified iron deficiency anemia type - CBC with Differential/Platelet; Future  4. Radiation esophagitis -keep GI f/up (last seen as OP in May) - Comprehensive metabolic panel  5. Thrombocytopenia (HCC) - CBC with Differential/Platelet; Future  6. Hospital discharge follow-up   7. Vomiting, intractability of vomiting not specified, presence of nausea not specified, unspecified vomiting type Resolved  8. Deaf-ASL interpreters used and additional time performing visit was required.    Patient have been counseled extensively about nutrition and exercise  Return in about 3 months (around 05/31/2020) for with PCP;  chronic  conditions.  The patient was given clear instructions to go to ER or return to medical center if symptoms don't improve, worsen or new problems develop. The patient verbalized understanding. The patient was told to call to get lab results if they haven't heard anything in the next week.     Freeman Caldron, PA-C [facility] and [facility] [place], [place]   02/29/2020, 10:17 AM

## 2020-02-29 ENCOUNTER — Other Ambulatory Visit: Payer: Self-pay

## 2020-02-29 ENCOUNTER — Ambulatory Visit: Payer: Medicaid Other | Attending: Family Medicine | Admitting: Physician Assistant

## 2020-02-29 VITALS — BP 100/59 | HR 100 | Temp 97.7°F | Ht 61.0 in | Wt 133.0 lb

## 2020-02-29 DIAGNOSIS — D509 Iron deficiency anemia, unspecified: Secondary | ICD-10-CM | POA: Diagnosis not present

## 2020-02-29 DIAGNOSIS — E1169 Type 2 diabetes mellitus with other specified complication: Secondary | ICD-10-CM | POA: Diagnosis not present

## 2020-02-29 DIAGNOSIS — D696 Thrombocytopenia, unspecified: Secondary | ICD-10-CM | POA: Diagnosis not present

## 2020-02-29 DIAGNOSIS — E785 Hyperlipidemia, unspecified: Secondary | ICD-10-CM

## 2020-02-29 DIAGNOSIS — K208 Other esophagitis without bleeding: Secondary | ICD-10-CM

## 2020-02-29 DIAGNOSIS — E119 Type 2 diabetes mellitus without complications: Secondary | ICD-10-CM | POA: Diagnosis not present

## 2020-02-29 DIAGNOSIS — T66XXXA Radiation sickness, unspecified, initial encounter: Secondary | ICD-10-CM | POA: Diagnosis not present

## 2020-02-29 DIAGNOSIS — Z09 Encounter for follow-up examination after completed treatment for conditions other than malignant neoplasm: Secondary | ICD-10-CM

## 2020-02-29 DIAGNOSIS — H9193 Unspecified hearing loss, bilateral: Secondary | ICD-10-CM

## 2020-02-29 DIAGNOSIS — R1110 Vomiting, unspecified: Secondary | ICD-10-CM

## 2020-02-29 LAB — GLUCOSE, POCT (MANUAL RESULT ENTRY): POC Glucose: 84 mg/dl (ref 70–99)

## 2020-02-29 MED ORDER — ATORVASTATIN CALCIUM 40 MG PO TABS: 40.0000 mg | ORAL_TABLET | Freq: Every day | ORAL | 1 refills | Status: DC

## 2020-03-01 ENCOUNTER — Other Ambulatory Visit: Payer: Self-pay | Admitting: *Deleted

## 2020-03-01 DIAGNOSIS — Z171 Estrogen receptor negative status [ER-]: Secondary | ICD-10-CM

## 2020-03-01 DIAGNOSIS — C50512 Malignant neoplasm of lower-outer quadrant of left female breast: Secondary | ICD-10-CM

## 2020-03-01 LAB — COMPREHENSIVE METABOLIC PANEL
ALT: 17 IU/L (ref 0–32)
AST: 20 IU/L (ref 0–40)
Albumin/Globulin Ratio: 1.2 (ref 1.2–2.2)
Albumin: 4 g/dL (ref 3.8–4.9)
Alkaline Phosphatase: 175 IU/L — ABNORMAL HIGH (ref 48–121)
BUN/Creatinine Ratio: 18 (ref 9–23)
BUN: 10 mg/dL (ref 6–24)
Bilirubin Total: <0.2 mg/dL (ref 0.0–1.2)
CO2: 19 mmol/L — ABNORMAL LOW (ref 20–29)
Calcium: 8.6 mg/dL — ABNORMAL LOW (ref 8.7–10.2)
Chloride: 106 mmol/L (ref 96–106)
Creatinine, Ser: 0.55 mg/dL — ABNORMAL LOW (ref 0.57–1.00)
GFR calc Af Amer: 121 mL/min/{1.73_m2} (ref >59)
GFR calc non Af Amer: 105 mL/min/{1.73_m2} (ref >59)
Globulin, Total: 3.3 g/dL (ref 1.5–4.5)
Glucose: 75 mg/dL (ref 65–99)
Potassium: 3.7 mmol/L (ref 3.5–5.2)
Sodium: 137 mmol/L (ref 134–144)
Total Protein: 7.3 g/dL (ref 6.0–8.5)

## 2020-03-01 LAB — CBC WITH DIFFERENTIAL/PLATELET
Basophils Absolute: 0 10*3/uL (ref 0.0–0.2)
Basos: 0 %
EOS (ABSOLUTE): 0 10*3/uL (ref 0.0–0.4)
Eos: 1 %
Hematocrit: 27.9 % — ABNORMAL LOW (ref 34.0–46.6)
Hemoglobin: 9.2 g/dL — ABNORMAL LOW (ref 11.1–15.9)
Immature Grans (Abs): 0 10*3/uL (ref 0.0–0.1)
Immature Granulocytes: 1 %
Lymphocytes Absolute: 1.4 10*3/uL (ref 0.7–3.1)
Lymphs: 30 %
MCH: 31.8 pg (ref 26.6–33.0)
MCHC: 33 g/dL (ref 31.5–35.7)
MCV: 97 fL (ref 79–97)
Monocytes Absolute: 0.7 10*3/uL (ref 0.1–0.9)
Monocytes: 14 %
Neutrophils Absolute: 2.6 10*3/uL (ref 1.4–7.0)
Neutrophils: 54 %
Platelets: 112 10*3/uL — ABNORMAL LOW (ref 150–450)
RBC: 2.89 x10E6/uL — ABNORMAL LOW (ref 3.77–5.28)
RDW: 22.9 % — ABNORMAL HIGH (ref 11.7–15.4)
WBC: 4.8 10*3/uL (ref 3.4–10.8)

## 2020-03-04 ENCOUNTER — Telehealth: Payer: Self-pay | Admitting: Adult Health

## 2020-03-04 ENCOUNTER — Telehealth: Payer: Self-pay

## 2020-03-04 ENCOUNTER — Inpatient Hospital Stay: Payer: Medicaid Other

## 2020-03-04 ENCOUNTER — Encounter: Payer: Self-pay | Admitting: *Deleted

## 2020-03-04 ENCOUNTER — Other Ambulatory Visit: Payer: Self-pay

## 2020-03-04 ENCOUNTER — Ambulatory Visit (HOSPITAL_COMMUNITY)
Admission: RE | Admit: 2020-03-04 | Discharge: 2020-03-04 | Disposition: A | Discharge status: Home / Self Care | Payer: Medicaid Other | Source: Ambulatory Visit | Attending: Adult Health | Admitting: Adult Health

## 2020-03-04 ENCOUNTER — Encounter: Payer: Self-pay | Admitting: Adult Health

## 2020-03-04 ENCOUNTER — Inpatient Hospital Stay (HOSPITAL_BASED_OUTPATIENT_CLINIC_OR_DEPARTMENT_OTHER): Payer: Medicaid Other | Admitting: Adult Health

## 2020-03-04 VITALS — BP 130/74 | HR 100 | Temp 99.1°F | Resp 17 | Ht 61.0 in | Wt 135.6 lb

## 2020-03-04 DIAGNOSIS — C50512 Malignant neoplasm of lower-outer quadrant of left female breast: Secondary | ICD-10-CM

## 2020-03-04 DIAGNOSIS — C7951 Secondary malignant neoplasm of bone: Secondary | ICD-10-CM

## 2020-03-04 DIAGNOSIS — C770 Secondary and unspecified malignant neoplasm of lymph nodes of head, face and neck: Secondary | ICD-10-CM | POA: Insufficient documentation

## 2020-03-04 DIAGNOSIS — Z171 Estrogen receptor negative status [ER-]: Secondary | ICD-10-CM

## 2020-03-04 DIAGNOSIS — M25552 Pain in left hip: Secondary | ICD-10-CM | POA: Diagnosis not present

## 2020-03-04 DIAGNOSIS — C5701 Malignant neoplasm of right fallopian tube: Secondary | ICD-10-CM | POA: Diagnosis not present

## 2020-03-04 DIAGNOSIS — M79652 Pain in left thigh: Secondary | ICD-10-CM | POA: Diagnosis not present

## 2020-03-04 LAB — CBC WITH DIFFERENTIAL (CANCER CENTER ONLY)
Abs Immature Granulocytes: 0.03 10*3/uL (ref 0.00–0.07)
Basophils Absolute: 0 10*3/uL (ref 0.0–0.1)
Basophils Relative: 0 %
Eosinophils Absolute: 0 10*3/uL (ref 0.0–0.5)
Eosinophils Relative: 1 %
HCT: 27.3 % — ABNORMAL LOW (ref 36.0–46.0)
Hemoglobin: 8.7 g/dL — ABNORMAL LOW (ref 12.0–15.0)
Immature Granulocytes: 1 %
Lymphocytes Relative: 26 %
Lymphs Abs: 1.1 10*3/uL (ref 0.7–4.0)
MCH: 31.9 pg (ref 26.0–34.0)
MCHC: 31.9 g/dL (ref 30.0–36.0)
MCV: 100 fL (ref 80.0–100.0)
Monocytes Absolute: 0.6 10*3/uL (ref 0.1–1.0)
Monocytes Relative: 15 %
Neutro Abs: 2.5 10*3/uL (ref 1.7–7.7)
Neutrophils Relative %: 57 %
Platelet Count: 161 10*3/uL (ref 150–400)
RBC: 2.73 MIL/uL — ABNORMAL LOW (ref 3.87–5.11)
RDW: 23.8 % — ABNORMAL HIGH (ref 11.5–15.5)
WBC Count: 4.3 10*3/uL (ref 4.0–10.5)
nRBC: 0 % (ref 0.0–0.2)

## 2020-03-04 LAB — CMP (CANCER CENTER ONLY)
ALT: 19 U/L (ref 0–44)
AST: 18 U/L (ref 15–41)
Albumin: 3.2 g/dL — ABNORMAL LOW (ref 3.5–5.0)
Alkaline Phosphatase: 158 U/L — ABNORMAL HIGH (ref 38–126)
Anion gap: 10 (ref 5–15)
BUN: 10 mg/dL (ref 6–20)
CO2: 22 mmol/L (ref 22–32)
Calcium: 8.9 mg/dL (ref 8.9–10.3)
Chloride: 110 mmol/L (ref 98–111)
Creatinine: 0.63 mg/dL (ref 0.44–1.00)
GFR, Est AFR Am: >60 mL/min (ref >60)
GFR, Estimated: >60 mL/min (ref >60)
Glucose, Bld: 101 mg/dL — ABNORMAL HIGH (ref 70–99)
Potassium: 3.6 mmol/L (ref 3.5–5.1)
Sodium: 142 mmol/L (ref 135–145)
Total Bilirubin: <0.2 mg/dL — ABNORMAL LOW (ref 0.3–1.2)
Total Protein: 7.5 g/dL (ref 6.5–8.1)

## 2020-03-04 MED ORDER — LIDOCAINE-PRILOCAINE 2.5-2.5 % EX CREA
1.0000 | TOPICAL_CREAM | CUTANEOUS | 0 refills | Status: DC | PRN
Start: 2020-03-04 — End: 2020-09-03

## 2020-03-04 NOTE — Progress Notes (Addendum)
[facility] Follow up:    Erin Blackbird, MD [place] [address]   DIAGNOSIS: Cancer Staging Fallopian tube cancer, carcinoma, right [facility]) Staging form: Ovary, Fallopian Tube, and Primary Peritoneal Carcinoma, AJCC 8th Edition - Clinical stage from 11/04/2017: FIGO Stage I, calculated as Stage Unknown (cT1c2, cNX, cM0) - Signed by [name], MD on 11/22/2017  Malignant neoplasm of lower-outer quadrant of left breast of female, estrogen receptor negative ([place]) Staging form: Breast, AJCC 8th Edition - Clinical stage from 05/18/2017: Stage IA (cT1b, cN0, cM0, G3, ER: Negative, PR: Negative, HER2: Positive) - Unsigned - Pathologic: Stage IA (pT1c, pN0, cM0, G3, ER: Negative, PR: Negative, HER2: Positive) - Signed by [name], NP on 07/07/2017   SUMMARY OF ONCOLOGIC HISTORY: Oncology History  Malignant neoplasm of lower-outer quadrant of left breast of female, estrogen receptor negative ([place])  05/11/2017 Initial Diagnosis   Left breast biopsy 3:30 position 6 cm from nipple: IDC with DCIS, lymphovascular invasion present, grade 2-3, ER 0%, PR 0%, HER-2 positive ratio 2.28, Ki-67 20%, 1 cm lesion left breast, T1b N0 stage IA clinical stage   06/09/2017 Surgery   Left lumpectomy: IDC grade 3, 1.2 cm, DCIS is present, margins negative, 0/6 lymph nodes negative, ER 0%, PR 0%, HER-2 positive ratio 2.28, Ki-67 20%, T1 BN 0 stage IA   07/09/2017 - 09/29/2017 Adjuvant Chemotherapy   Taxol/Herceptin x 12 completed on 09/29/2017, went on to receive every 3 week adjuvant Herceptin through 06/28/2018   11/25/2017 - 01/07/2018 Radiation Therapy    The patient initially received a dose of 50.4 Gy in 28 fractions to the breast using whole-breast tangent fields. This was delivered using a 3-D conformal technique. The patient then received a boost to the seroma. This delivered an additional 10 Gy in 5 fractions using a 3-D technique. The total dose was 60.4  Gy.   02/15/2018 Genetic Testing   The Common Hereditary Cancer Panel offered by [facility] includes sequencing and/or deletion duplication testing of the following 47 genes: APC, ATM, AXIN2, BARD1, BMPR1A, BRCA1, BRCA2, BRIP1, CDH1, CDKN2A (p14ARF), CDKN2A (p16INK4a), CKD4, CHEK2, CTNNA1, DICER1, EPCAM (Deletion/duplication testing only), GREM1 (promoter region deletion/duplication testing only), KIT, MEN1, MLH1, MSH2, MSH3, MSH6, MUTYH, NBN, NF1, NHTL1, PALB2, PDGFRA, PMS2, POLD1, POLE, PTEN, RAD50, RAD51C, RAD51D, SDHB, SDHC, SDHD, SMAD4, SMARCA4. STK11, TP53, TSC1, TSC2, and VHL.  The following genes were evaluated for sequence changes only: SDHA and HOXB13 c.251G>A variant only.   Results: POSITIVE. 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 The date of this test report is 02/15/2018   10/20/2019 PET scan   Bone mets involving Right hip, Bil Iliac bones, T8, T9, L2 and L3, Rt prox humerus, Righ glenoid. Some lytic and some sclerotic. Mild Left supraclav adenopathy, 2 small lesions basal ganglia (could be lacunar infarcts)   11/14/2019 - 01/15/2020 Chemotherapy   The patient had palonosetron (ALOXI) injection 0.25 mg, 0.25 mg, Intravenous,  Once, 2 of 5 cycles Administration: 0.25 mg (12/05/2019), 0.25 mg (12/26/2019) ado-trastuzumab emtansine (KADCYLA) 260 mg in sodium chloride 0.9 % 250 mL chemo infusion, 3.6 mg/kg = 260 mg, Intravenous, Once, 3 of 6 cycles Administration: 260 mg (11/14/2019), 260 mg (12/05/2019), 260 mg (12/26/2019)  for chemotherapy treatment.    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  Required TPN briefly.  Patient was able to swallow and keep  food down by the time of discharge.   02/06/2020 Miscellaneous   Talazoparib   Fallopian tube cancer, carcinoma, right ([place])  11/04/2017 Surgery   Uterus, cervix and bilateral  fallopian tubes: Fallopian tube with serous carcinoma 0.6 cm positive for p53, PAX8, WT-1, MOC-31, cytokeratin 7, and estrogen receptor   01/11/2018 -  Chemotherapy   Taxol and carboplatin every 3 weeks (Taxol discontinued with cycle 2 because of profound rash)   10/20/2019 PET scan   Bone mets involving Right hip, Bil Iliac bones, T8, T9, L2 and L3, Rt prox humerus, Righ glenoid. Some lytic and some sclerotic. Mild Left supraclav adenopathy, 2 small lesions basal ganglia (could be lacunar infarcts)   11/14/2019 - 01/15/2020 Chemotherapy   The patient had palonosetron (ALOXI) injection 0.25 mg, 0.25 mg, Intravenous,  Once, 2 of 5 cycles Administration: 0.25 mg (12/05/2019), 0.25 mg (12/26/2019) ado-trastuzumab emtansine (KADCYLA) 260 mg in sodium chloride 0.9 % 250 mL chemo infusion, 3.6 mg/kg = 260 mg, Intravenous, Once, 3 of 6 cycles Administration: 260 mg (11/14/2019), 260 mg (12/05/2019), 260 mg (12/26/2019)  for chemotherapy treatment.      CURRENT THERAPY: Talozoparib  INTERVAL HISTORY: Erin [name] 59 y.o. female returns for evlauation of her metastatic breast cancer.  Since her last visit she restarted her Talazoparib and took it every other day.  Her plt count has climbed to 161 today.  She is feeling improved in regards to her energy and swallowing.  She says her neck/shoulder discomfort is improved, however she notes pain in her anterior upper left leg when getting up from sitting or standing, just below her pelvis.  This has been going on for about 2 weeks and she cannot remember something causing it.     Patient Active Problem List   Diagnosis Date Noted  . Burning in the chest   . Vomiting   . Dysphagia 01/29/2020  . 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  . Esophageal candidiasis ([place]) 01/29/2020  . Bone metastases ([place]) 12/28/2019  . Pain from bone metastases ([place]) 12/28/2019  . Goals of care, counseling/discussion 11/07/2019  . Secondary malignant neoplasm of supraclavicular  lymph nodes ([place]) 11/03/2019  . Secondary malignant neoplasm of lumbar vertebral column ([place]) 11/03/2019  . 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[place]) 11/03/2019  . Elevated CA-125 10/09/2019  . Paresthesia 04/17/2019  . BRCA2 gene mutation positive 02/17/2018  . Genetic testing 02/17/2018  . Family history of breast cancer   . Family history of colon cancer   . Fallopian tube cancer, carcinoma, right ([place]) 11/22/2017  . S/P vaginal hysterectomy 11/04/2017  . Ovarian mass, left 08/26/2017  . Menorrhagia 08/26/2017  . Port-A-Cath in place 07/23/2017  . Malignant neoplasm of lower-outer quadrant of left breast of female, estrogen receptor negative ([place]) 05/13/2017  . Abdominal bloating 04/02/2017  . Chronic right-sided low back pain without sciatica 07/02/2016  . DM type 2 (diabetes mellitus, type 2) ([place]) 11/18/2014  . Intermittent constipation 06/06/2010  . UNSPECIFIED IRON DEFICIENCY ANEMIA 04/22/2010  . DEPRESSION 04/10/2010  . Hearing loss 04/10/2010    has No Known Allergies.  MEDICAL HISTORY: Past Medical History:  Diagnosis Date  . Arthritis    knees, elbows  . Borderline glaucoma of right eye   . BRCA2 gene mutation positive 02/17/2018   BRCA2 c.4552del (p.Glu1518Asnfs*25)  Result reported out on 02/15/2018.   . Breast cancer ([place]) 05/2017   left  . Chronic back pain   . Deaf    per pt born hearing and at age 48  1/2 lost hearing , was told by mother unknown cause, can miminally hear in left and no hearing on right   . Depression   . Dyspnea    states some SOB with ADLs and anemia   . Elevated cancer antigen 125 (CA 125)   . Family history of breast cancer   . Family history of breast cancer   . Family history of colon cancer   . Genetic testing 02/17/2018   The Common Hereditary Cancer Panel offered by [facility] includes sequencing and/or deletion duplication testing of the following 47 genes: APC, ATM, AXIN2, BARD1, BMPR1A, BRCA1, BRCA2, BRIP1, CDH1, CDKN2A (p14ARF),  CDKN2A (p16INK4a), CKD4, CHEK2, CTNNA1, DICER1, EPCAM (Deletion/duplication testing only), GREM1 (promoter region deletion/duplication testing only), KIT, MEN1, MLH1, MSH2, MSH3, MSH6, MU  . History of cancer chemotherapy    left breast cancer 09-08-2017 to 10-09-2017;  fallopion tube cancer  12-15-2017  to 06-28-2018  . 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 History of external beam radiation therapy    left breast  11-25-2017  to 01-07-2018  . Hyperlipidemia   . Hypertension    followed by pcp   (10-05-2019  per pt never had stress test)  . IDA (iron deficiency anemia)   . Malignant neoplasm of lower-outer quadrant of left breast of female, estrogen receptor negative [facility]) oncologist-- dr [name]   dx 08/ 2018--- Stage IA, DCIS,  ER/ PR negative,  HER-2 positive;  06-09-2017 s/p left breast lumpectomy with node dissection;  completed chemo 10-09-2017  and radiation 01-07-2018/  hercepton completed 06-28-2018  . Non-insulin dependent type 2 diabetes mellitus ([place])    followed by pcp   (10-05-2019 per pt check cbg every other day in AM,  fasting cbg-- 105)  . Numbness of right thumb   . Wears glasses     SURGICAL HISTORY: Past Surgical History:  Procedure Laterality Date  . BREAST LUMPECTOMY WITH RADIOACTIVE SEED AND SENTINEL LYMPH NODE BIOPSY Left 06/09/2017   Procedure: BREAST LUMPECTOMY WITH RADIOACTIVE SEED AND SENTINEL LYMPH NODE BIOPSY;  Surgeon: Excell Seltzer, MD;  Location: [facility];  Service: General;  Laterality: Left;  . COLONOSCOPY  11/02/2017   polyps  . HERNIA REPAIR    . HIATAL HERNIA REPAIR  09/2019  . LAPAROSCOPIC ASSISTED VAGINAL HYSTERECTOMY N/A 11/04/2017   Procedure: LAPAROSCOPIC ASSISTED VAGINAL HYSTERECTOMY;  Surgeon: [name], MD;  Location: [facility];  Service:  Gynecology;  Laterality: N/A;  . LAPAROSCOPIC BILATERAL SALPINGO OOPHERECTOMY Bilateral 11/04/2017   Procedure: LAPAROSCOPIC BILATERAL SALPINGO OOPHORECTOMY;  Surgeon: [name], MD;  Location: [facility];  Service: Gynecology;  Laterality: Bilateral;  . LAPAROSCOPY N/A 10/09/2019   Procedure: LAPAROSCOPY DIAGNOSTIC WITH PERITONEAL BIOPSIES;  Surgeon: [name], MD;  Location: [facility];  Service: Gynecology;  Laterality: N/A;  . PORTACATH PLACEMENT Right 06/09/2017   Procedure: INSERTION PORT-A-CATH WITH [place];  Surgeon: Excell Seltzer, MD;  Location: [facility];  Service: General;  Laterality: Right;  . PORTACATH PLACEMENT Right 11/13/2019   Procedure: INSERTION PORT-A-CATH WITH ULTRASOUND GUIDANCE;  Surgeon: Rolm Bookbinder, MD;  Location: [facility];  Service: General;  Laterality: Right;  . TUBAL LIGATION  02/02/2002   @[facility]    [facility]  . UPPER GI ENDOSCOPY      SOCIAL HISTORY: Social  History   Socioeconomic History  . Marital status: Legally Separated    Spouse name: Not on file  . Number of children: 2  . Years of education: Not on file  . Highest education level: Not on file  Occupational History  . Not on file  Tobacco Use  . Smoking status: Never Smoker  . Smokeless tobacco: Never Used  Vaping Use  . Vaping Use: Never used  Substance and Sexual Activity  . Alcohol use: No  . Drug use: Never  . Sexual activity: Not on file  Other Topics Concern  . Not on file  Social History Narrative   1 boy and 1 girl   Resides in [place] Determinants of Health   Financial Resource Strain:   . Difficulty of Paying Living Expenses:   Food Insecurity:   . Worried About Charity fundraiser in the Last Year:   . Arboriculturist in the Last Year:   Transportation Needs:   . Film/video editor (Medical):   Marland Kitchen Lack of Transportation (Non-Medical):   Physical Activity:   . Days of Exercise per Week:   .  Minutes of Exercise per Session:   Stress:   . Feeling of Stress :   Social Connections:   . Frequency of Communication with Friends and Family:   . Frequency of Social Gatherings with Friends and Family:   . Attends Religious Services:   . Active Member of Clubs or Organizations:   . Attends Archivist Meetings:   Marland Kitchen Marital Status:   Intimate Partner Violence:   . Fear of Current or Ex-Partner:   . Emotionally Abused:   Marland Kitchen Physically Abused:   . Sexually Abused:     FAMILY HISTORY: Family History  Problem Relation Age of Onset  . Hypertension Mother   . Lupus Mother   . Diabetes Father   . Hypertension Sister   . Diabetes Paternal Grandmother   . Colon cancer Paternal Grandmother 35  . CAD Brother   . Diabetes Brother   . Heart attack Brother   . Breast cancer Maternal Aunt        dx >50  . Heart attack Paternal Grandfather   . Breast cancer Maternal Aunt        dx under 92  . Breast cancer Maternal Aunt        dx  under 50    Review of Systems  Constitutional: Negative for appetite change, chills, fatigue, fever and unexpected weight change.  HENT:   Negative for hearing loss, lump/mass, sore throat and trouble swallowing.   Eyes: Negative for eye problems and icterus.  Respiratory: Negative for chest tightness, cough and shortness of breath.   Cardiovascular: Negative for chest pain, leg swelling and palpitations.  Gastrointestinal: Negative for abdominal distention and abdominal pain.  Endocrine: Negative for hot flashes.  Genitourinary: Negative for difficulty urinating.   Musculoskeletal: Negative for arthralgias.  Skin: Negative for itching and rash.  Neurological: Negative for dizziness, extremity weakness and numbness.  Hematological: Negative for adenopathy. Does not bruise/bleed easily.  Psychiatric/Behavioral: Negative for depression. The patient is not nervous/anxious.       PHYSICAL EXAMINATION  ECOG PERFORMANCE STATUS: 1 - Symptomatic  but completely ambulatory  Vitals:   03/04/20 0816  BP: 130/74  Pulse: 100  Resp: 17  Temp: 99.1 F (37.3 C)  SpO2: 97%    Physical Exam Constitutional:      General: She  is not in acute distress.    Appearance: Normal appearance. She is not toxic-appearing.  HENT:     Head: Normocephalic and atraumatic.  Eyes:     General: No scleral icterus. Cardiovascular:     Rate and Rhythm: Normal rate and regular rhythm.     Pulses: Normal pulses.     Heart sounds: Normal heart sounds.  Pulmonary:     Effort: Pulmonary effort is normal.     Breath sounds: Normal breath sounds.  Abdominal:     General: Abdomen is flat. Bowel sounds are normal.     Palpations: Abdomen is soft.  Musculoskeletal:     Cervical back: Neck supple.  Skin:    General: Skin is warm and dry.     Capillary Refill: Capillary refill takes less than 2 seconds.     Findings: No rash.  Neurological:     General: No focal deficit present.     Mental Status: She is alert.  Psychiatric:        Mood and Affect: Mood normal.        Behavior: Behavior normal.     LABORATORY DATA:  CBC    Component Value Date/Time   WBC 4.3 03/04/2020 0830   WBC 3.4 (L) 02/03/2020 0645   RBC 2.73 (L) 03/04/2020 0830   HGB 8.7 (L) 03/04/2020 0830   HGB 9.2 (L) 02/29/2020 1025   HGB 9.3 (L) 09/15/2017 0800   HCT 27.3 (L) 03/04/2020 0830   HCT 27.9 (L) 02/29/2020 1025   HCT 28.2 (L) 09/15/2017 0800   PLT 161 03/04/2020 0830   PLT 112 (L) 02/29/2020 1025   MCV 100.0 03/04/2020 0830   MCV 97 02/29/2020 1025   MCV 82.4 09/15/2017 0800   MCH 31.9 03/04/2020 0830   MCHC 31.9 03/04/2020 0830   RDW 23.8 (H) 03/04/2020 0830   RDW 22.9 (H) 02/29/2020 1025   RDW 20.8 (H) 09/15/2017 0800   LYMPHSABS 1.1 03/04/2020 0830   LYMPHSABS 1.4 02/29/2020 1025   LYMPHSABS 2.5 09/15/2017 0800   MONOABS 0.6 03/04/2020 0830   MONOABS 0.8 09/15/2017 0800   EOSABS 0.0 03/04/2020 0830   EOSABS 0.0 02/29/2020 1025   BASOSABS 0.0  03/04/2020 0830   BASOSABS 0.0 02/29/2020 1025   BASOSABS 0.0 09/15/2017 0800    CMP     Component Value Date/Time   NA 137 02/29/2020 1025   NA 139 09/15/2017 0800   K 3.7 02/29/2020 1025   K 3.8 09/15/2017 0800   CL 106 02/29/2020 1025   CO2 19 (L) 02/29/2020 1025   CO2 23 09/15/2017 0800   GLUCOSE 75 02/29/2020 1025   GLUCOSE 74 02/28/2020 1143   GLUCOSE 113 09/15/2017 0800   BUN 10 02/29/2020 1025   BUN 8.7 09/15/2017 0800   CREATININE 0.55 (L) 02/29/2020 1025   CREATININE 0.64 02/28/2020 1143   CREATININE 0.7 09/15/2017 0800   CALCIUM 8.6 (L) 02/29/2020 1025   CALCIUM 9.2 09/15/2017 0800   PROT 7.3 02/29/2020 1025   PROT 7.3 09/15/2017 0800   ALBUMIN 4.0 02/29/2020 1025   ALBUMIN 3.3 (L) 09/15/2017 0800   AST 20 02/29/2020 1025   AST 19 02/28/2020 1143   AST 13 09/15/2017 0800   ALT 17 02/29/2020 1025   ALT 18 02/28/2020 1143   ALT 19 09/15/2017 0800   ALKPHOS 175 (H) 02/29/2020 1025   ALKPHOS 74 09/15/2017 0800   BILITOT <0.2 02/29/2020 1025   BILITOT 0.2 (L) 02/28/2020 1143   BILITOT 0.27 09/15/2017  0800   GFRNONAA 105 02/29/2020 1025   GFRNONAA >60 02/28/2020 1143   GFRAA 121 02/29/2020 1025   GFRAA >60 02/28/2020 1143         ASSESSMENT and THERAPY PLAN:   Malignant neoplasm of lower-outer quadrant of left breast of female, estrogen receptor negative (H[facility]) Left lumpectomy: IDC grade 3, 1.2 cm, DCIS is present, margins negative, 0/6 lymph nodes negative, ER 0%, PR 0%, HER-2 positive ratio 2.28, Ki-67 20%, T1 BN 0 stage IA S/P Taxol-Herceptin BRCA 2 Mutation Positive  10/20/19:Bone mets involving Right hip, Bil Iliac bones, T8, T9, L2 and L3, Rt prox humerus, Righ glenoid. Some lytic and some sclerotic. Mild Left supraclav adenopathy, 2 small lesions basal ganglia (could be lacunar infarcts)  Biopsy the supra clav LN: 10/31/2019: Metastatic breast cancer, ER/PR negative HER-2 equivocal by IHC, FISHpositive Treatmentsummary: Kadcyla every 3-week  treatment.Discontinued for progression  MRI of the back 01/07/2020: Extensive metastatic disease to the involved spine. Tumor replacement of L3 vertebral body with pathologic compression fracture, epidural and extraosseous extension. Tumor extending throughout L3 foramen Status post palliative radiation therapy Causing severe dysphagia and dehydration and hospitalization 01/29/2020-02/03/2020 ---------------------------------------------------------------------------------------------------------------------------------------------------------- Echocardiogram 11/14/2019 Current treatment:Talazoparib started 02/07/2020 Toxicities: Saran is feeling better after going back to the Talazoparib every other day.  She met with myself and Dr. [name] today.  He reviewed her labs and wants her to go back to every day dosing.  She notes she has a 30 day supply of this.  I placed an order for plain films of her femur and hip to evaluate this pain she is experiencing.    We will see her back on 03/13/2020 for labs and f/u with Dr. [name].  She knows to call for any questions that may arise between now and her next appointment.  We are happy to see her sooner if needed.    Orders Placed This Encounter  Procedures  . DG FEMUR MIN 2 VIEWS LEFT    Standing Status:   Future    Standing Expiration Date:   03/04/2021    Order Specific Question:   Reason for Exam (SYMPTOM  OR DIAGNOSIS REQUIRED)    Answer:   hip and upper leg pain with metastatic cancer    Order Specific Question:   Is patient pregnant?    Answer:   No    Order Specific Question:   Preferred imaging location?    Answer:   [facility]    Order Specific Question:   Radiology Contrast Protocol - do NOT remove file path    Answer:   \\[email or web address].[email or web address]  . DG Hip Unilat W or W/O Pelvis 1 View Left    Standing Status:   Future    Standing Expiration Date:   03/04/2021    Order Specific Question:    Reason for Exam (SYMPTOM  OR DIAGNOSIS REQUIRED)    Answer:   left leg pain, h/o metastatic cancer    Order Specific Question:   Is patient pregnant?    Answer:   No    Order Specific Question:   Preferred imaging location?    Answer:   [facility]    Order Specific Question:   Radiology Contrast Protocol - do NOT remove file path    Answer:   \\[email or web address]s.[email or web address]    All questions were answered. The patient knows to call the clinic with any problems, questions or concerns. We can certainly see the patient much sooner if necessary.  Wilber Bihari, NP 03/04/20 9:13 AM  Medical Oncology and Hematology [facility] [place], [place] [address] Tel. [phone]    Fax. [phone]  *Total Encounter Time as defined by the Centers for Medicare and Medicaid Services includes, in addition to the face-to-face time of a patient visit (documented in the note above) non-face-to-face time: obtaining and reviewing outside history, ordering and reviewing medications, tests or procedures, care coordination (communications with other health care professionals or caregivers) and documentation in the medical record.   Attending Note  I personally saw and examined Erin [name]. The plan of care was discussed with her. I agree with the assessment and plan as documented above. Thrombocytopenia: Marked improvement I encouraged her to take talazoparib tablets daily from today onwards. Recheck her labs and follow-up next Wednesday. Signed Harriette Ohara, MD

## 2020-03-04 NOTE — Assessment & Plan Note (Signed)
Left lumpectomy: IDC grade 3, 1.2 cm, DCIS is present, margins negative, 0/6 lymph nodes negative, ER 0%, PR 0%, HER-2 positive ratio 2.28, Ki-67 20%, T1 BN 0 stage IA S/P Taxol-Herceptin BRCA 2 Mutation Positive  10/20/19:Bone mets involving Right hip, Bil Iliac bones, T8, T9, L2 and L3, Rt prox humerus, Righ glenoid. Some lytic and some sclerotic. Mild Left supraclav adenopathy, 2 small lesions basal ganglia (could be lacunar infarcts)  Biopsy the supra clav LN: 10/31/2019: Metastatic breast cancer, ER/PR negative HER-2 equivocal by IHC, FISHpositive Treatmentsummary: Kadcyla every 3-week treatment.Discontinued for progression  MRI of the back 01/07/2020: Extensive metastatic disease to the involved spine. Tumor replacement of L3 vertebral body with pathologic compression fracture, epidural and extraosseous extension. Tumor extending throughout L3 foramen Status post palliative radiation therapy Causing severe dysphagia and dehydration and hospitalization 01/29/2020-02/03/2020 ---------------------------------------------------------------------------------------------------------------------------------------------------------- Echocardiogram 11/14/2019 Current treatment:Talazoparib started 02/07/2020 Toxicities: Vista is feeling better after going back to the Talazoparib every other day.  She met with myself and Dr. [name] today.  He reviewed her labs and wants her to go back to every day dosing.  She notes she has a 30 day supply of this.  I placed an order for plain films of her femur and hip to evaluate this pain she is experiencing.    We will see her back on 03/13/2020 for labs and f/u with Dr. [name].  She knows to call for any questions that may arise between now and her next appointment.  We are happy to see her sooner if needed.

## 2020-03-04 NOTE — Telephone Encounter (Signed)
Scheduled appts per 6/21 los. Gave pt a print out of AVS.  

## 2020-03-04 NOTE — Telephone Encounter (Signed)
-----   Message from [name], NP sent at 03/04/2020  3:30 PM EDT ----- Please call patient and let her know that there is no identifiable issue in her left him.  Would recommend tylenol and aleve, and if persistent, let Dr. [name] know as she should get MRI.   ----- Message ----- From: Interface, Rad Results In Sent: 03/04/2020   3:23 PM EDT To: [name], NP

## 2020-03-04 NOTE — Telephone Encounter (Signed)
Called and given below message to Mother. She verbalzied understanding and will call the office back if needed.

## 2020-03-06 MED FILL — TALZENNA 1 MG CAPS: 1 | 30 days supply | Qty: 30 | Fill #2

## 2020-03-12 ENCOUNTER — Telehealth: Payer: Self-pay | Admitting: Radiation Oncology

## 2020-03-12 NOTE — Telephone Encounter (Signed)
  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    Interval Since Last Radiation: 8 weeks   01/01/20-01/19/20: The bilateral iliac bones, right proximal femur, and L2-3 and T8 sites were each treated to 37.5 Gy in 15 fractions.  11/25/2017 - 01/10/2018: The patient initially received a dose of 50.4 Gy in 28 fractions to the breast using whole-breast tangent fields. This was delivered using a 3-D conformal technique. The patient then received a boost to the seroma. This delivered an additional 10 Gy in 5 fractions using a 3-D technique. The total dose was 60.4 Gy.  Narrative:  The patient was contacted today for routine follow-up. During treatment she did very well with radiotherapy and did not have significant desquamation. She reports she is doing well and her pain is much better and she's had improvement in her dysphagia she had following radiotherapy and is eating and drinking well at this time.  Impression/Plan: 1. 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  The patient has been doing well since completion of radiotherapy. We discussed that we would be happy to continue to follow her as needed, but she will continue to follow up with Dr. [name] in medical oncology.     Carola Rhine, PAC

## 2020-03-12 NOTE — Progress Notes (Signed)
 Patient Care Team: Fulp, Cammie, MD as PCP - General (Family Medicine) Hoxworth, Benjamin, MD (Inactive) as Consulting Physician (General Surgery) Karmen Altamirano, MD as Consulting Physician (Hematology and Oncology) Moody, John, MD as Consulting Physician (Radiation Oncology) Rossi, Emma, MD as Consulting Physician (Gynecologic Oncology) Causey, Lindsey Cornetto, NP as Nurse Practitioner (Hematology and Oncology) Stuart, Dawn C, RN as Oncology Nurse Navigator Martini, Keisha N, RN as Oncology Nurse Navigator  DIAGNOSIS:    ICD-10-CM   1. Malignant neoplasm of lower-outer quadrant of left breast of female, estrogen receptor negative (HCC)  C50.512    Z17.1     SUMMARY OF ONCOLOGIC HISTORY: Oncology History  Malignant neoplasm of lower-outer quadrant of left breast of female, estrogen receptor negative (HCC)  05/11/2017 Initial Diagnosis   Left breast biopsy 3:30 position 6 cm from nipple: IDC with DCIS, lymphovascular invasion present, grade 2-3, ER 0%, PR 0%, HER-2 positive ratio 2.28, Ki-67 20%, 1 cm lesion left breast, T1b N0 stage IA clinical stage   06/09/2017 Surgery   Left lumpectomy: IDC grade 3, 1.2 cm, DCIS is present, margins negative, 0/6 lymph nodes negative, ER 0%, PR 0%, HER-2 positive ratio 2.28, Ki-67 20%, T1 BN 0 stage IA   07/09/2017 - 09/29/2017 Adjuvant Chemotherapy   Taxol/Herceptin x 12 completed on 09/29/2017, went on to receive every 3 week adjuvant Herceptin through 06/28/2018   11/25/2017 - 01/07/2018 Radiation Therapy    The patient initially received a dose of 50.4 Gy in 28 fractions to the breast using whole-breast tangent fields. This was delivered using a 3-D conformal technique. The patient then received a boost to the seroma. This delivered an additional 10 Gy in 5 fractions using a 3-D technique. The total dose was 60.4 Gy.   02/15/2018 Genetic Testing   The Common Hereditary Cancer Panel offered by [facility] includes sequencing and/or deletion duplication  testing of the following 47 genes: APC, ATM, AXIN2, BARD1, BMPR1A, BRCA1, BRCA2, BRIP1, CDH1, CDKN2A (p14ARF), CDKN2A (p16INK4a), CKD4, CHEK2, CTNNA1, DICER1, EPCAM (Deletion/duplication testing only), GREM1 (promoter region deletion/duplication testing only), KIT, MEN1, MLH1, MSH2, MSH3, MSH6, MUTYH, NBN, NF1, NHTL1, PALB2, PDGFRA, PMS2, POLD1, POLE, PTEN, RAD50, RAD51C, RAD51D, SDHB, SDHC, SDHD, SMAD4, SMARCA4. STK11, TP53, TSC1, TSC2, and VHL.  The following genes were evaluated for sequence changes only: SDHA and HOXB13 c.251G>A variant only.   Results: POSITIVE. 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 The date of this test report is 02/15/2018   10/20/2019 PET scan   Bone mets involving Right hip, Bil Iliac bones, T8, T9, L2 and L3, Rt prox humerus, Righ glenoid. Some lytic and some sclerotic. Mild Left supraclav adenopathy, 2 small lesions basal ganglia (could be lacunar infarcts)   11/14/2019 - 01/15/2020 Chemotherapy   The patient had palonosetron (ALOXI) injection 0.25 mg, 0.25 mg, Intravenous,  Once, 2 of 5 cycles Administration: 0.25 mg (12/05/2019), 0.25 mg (12/26/2019) ado-trastuzumab emtansine (KADCYLA) 260 mg in sodium chloride 0.9 % 250 mL chemo infusion, 3.6 mg/kg = 260 mg, Intravenous, Once, 3 of 6 cycles Administration: 260 mg (11/14/2019), 260 mg (12/05/2019), 260 mg (12/26/2019)  for chemotherapy treatment.    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  Required TPN briefly.  Patient was able to swallow and keep food down by the time of discharge.   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 Some lytic and some sclerotic. Mild Left supraclav adenopathy, 2 small lesions basal ganglia (could be lacunar infarcts)   11/14/2019 - 01/15/2020 Chemotherapy   The patient had palonosetron (ALOXI) injection 0.25 mg, 0.25 mg, Intravenous,  Once, 2 of 5 cycles Administration: 0.25 mg (12/05/2019), 0.25 mg (12/26/2019) ado-trastuzumab emtansine (KADCYLA) 260 mg in sodium chloride 0.9 % 250 mL chemo infusion, 3.6 mg/kg = 260 mg, Intravenous, Once, 3 of 6 cycles Administration: 260 mg (11/14/2019), 260 mg (12/05/2019), 260 mg (12/26/2019)  for chemotherapy treatment.      CHIEF COMPLIANT: Follow-up ofmetastatic breast cancer on talazoparib  INTERVAL HISTORY: Erin [name] is a 59 y.o. with above-mentioned history of metastaticbreast cancerwith bone metastases,and fallopian tube cancer.She is currently on treatment with Talazopariband Xgeva.She presents to the clinic todayfor treatment.    She reports her only complaint is a recent cold and cough with a whitish expectoration.  Denies any fevers or chills.  Her nutrition" her intake of fluids are still relatively low.  Her mother is planning to feed her this coming weekend and get her stronger.  ALLERGIES:  has No Known Allergies.  MEDICATIONS:  Current Outpatient Medications  Medication Sig Dispense Refill  . Accu-Chek FastClix Lancets MISC Use as instructed. Inject into the skin twice daily 100 each 3  . atorvastatin (LIPITOR) 40 MG tablet Take 1 tablet (40 mg total) by mouth daily. 90 tablet 1  . Blood Glucose Calibration (ACCU-CHEK GUIDE CONTROL) LIQD 1 each by In Vitro route once as needed for up to 1 dose. 1 each 0  . cholecalciferol (VITAMIN D) 1000 units tablet Take 1,000 Units by mouth daily.     . ferrous sulfate 325 (65 FE) MG tablet TAKE 1 TABLET (325 MG TOTAL) BY MOUTH DAILY BEFORE BREAKFAST. (Patient not taking: Reported on 02/29/2020) 90 tablet 1  . glimepiride (AMARYL) 2 MG tablet Take 1 tablet (2 mg total) by mouth daily with breakfast. To lower blood sugar 90 tablet 3  . glucose blood (ACCU-CHEK GUIDE) test strip Use as instructed. Check blood glucose by fingerstick twice per day. 100 each 12  . Lancet Devices (ACCU-CHEK SOFTCLIX) lancets Use as instructed 1 each 0  . lidocaine-prilocaine (EMLA) cream Apply 1 application topically as needed. 30 g 0  . metFORMIN (GLUCOPHAGE) 500 MG tablet Take 2 tablets (1,000 mg total) by mouth 2 (two) times daily with a meal. 60 tablet 1  . ondansetron (ZOFRAN ODT) 4 MG disintegrating tablet Take 1 tablet (4 mg total) by mouth every 8 (eight) hours as needed for nausea or vomiting. (Patient not taking: Reported on 02/29/2020) 90 tablet 0  . pantoprazole (PROTONIX) 40 MG tablet Take 1 tablet (40 mg total) by mouth 2 (two) times daily. 60 tablet 1  . talazoparib tosylate (TALZENNA) 1 MG capsule Take 1 capsule (1 mg total) by mouth daily. 30 capsule 3   No current facility-administered medications for this visit.   Facility-Administered Medications Ordered in Other Visits  Medication Dose Route Frequency Provider Last Rate Last Admin  . heparin lock flush 100 unit/mL  500  Units Intracatheter Once PRN [name], MD      . sodium chloride flush (NS) 0.9 % injection 10 mL  10 mL Intracatheter PRN [name], MD        PHYSICAL EXAMINATION: ECOG PERFORMANCE STATUS: 1 - Symptomatic but completely ambulatory  Vitals:   03/13/20 0916  BP: 120/72  Pulse: (!) 112  Resp: 18  Temp: 98.3 F (36.8 C)  SpO2: 100%   Filed Weights   03/13/20 0916  Weight: 132 lb 1.6 oz (59.9 kg)    LABORATORY DATA:  I have reviewed the data as listed CMP Latest Ref Rng & Units 03/13/2020 03/04/2020 02/29/2020  Glucose 70 - 99 mg/dL 102(H) 101(H) 75  BUN 6 -  20 mg/dL '7 10 10  '$ Creatinine 0.44 - 1.00 mg/dL 0.63 0.63 0.55(L)  Sodium 135 - 145 mmol/L 137 142 137  Potassium 3.5 - 5.1 mmol/L 3.5 3.6 3.7  Chloride 98 - 111 mmol/L 107 110 106  CO2 22 - 32 mmol/L 20(L) 22 19(L)  Calcium 8.9 - 10.3 mg/dL 8.4(L) 8.9 8.6(L)  Total Protein 6.5 - 8.1 g/dL 8.0 7.5 7.3  Total Bilirubin 0.3 - 1.2 mg/dL 0.3 <0.2(L) <0.2  Alkaline Phos 38 - 126 U/L 138(H) 158(H) 175(H)  AST 15 - 41 U/L '19 18 20  '$ ALT 0 - 44 U/L '13 19 17    '$ Lab Results  Component Value Date   WBC 6.4 03/13/2020   HGB 9.3 (L) 03/13/2020   HCT 29.0 (L) 03/13/2020   MCV 100.3 (H) 03/13/2020   PLT 106 (L) 03/13/2020   NEUTROABS 5.2 03/13/2020    ASSESSMENT & PLAN:  Malignant neoplasm of lower-outer quadrant of left breast of female, estrogen receptor negative (HCC) Left lumpectomy: IDC grade 3, 1.2 cm, DCIS is present, margins negative, 0/6 lymph nodes negative, ER 0%, PR 0%, HER-2 positive ratio 2.28, Ki-67 20%, T1 BN 0 stage IA S/P Taxol-Herceptin BRCA 2 Mutation Positive  10/20/19:Bone mets involving Right hip, Bil Iliac bones, T8, T9, L2 and L3, Rt prox humerus, Righ glenoid. Some lytic and some sclerotic. Mild Left supraclav adenopathy, 2 small lesions basal ganglia (could be lacunar infarcts)  Biopsy the supra clav LN: 10/31/2019: Metastatic breast cancer, ER/PR negative HER-2 equivocal by IHC, FISHpositive Treatmentsummary: Kadcyla every 3-week treatment.Discontinued for progression  MRI of the back 01/07/2020: Extensive metastatic disease to the involved spine. Tumor replacement of L3 vertebral body with pathologic compression fracture, epidural and extraosseous extension. Tumor extending throughout L3 foramen Status post palliative radiation therapyCausing severe dysphagia and dehydration and  hospitalization5/17/2021-02/03/2020 ---------------------------------------------------------------------------------------------------------------------------------------------------------- Echocardiogram 11/14/2019 Current treatment:Talazoparib started 02/07/2020 Toxicities: Denies any nausea or vomiting Monitoring closely for cytopenias. Anemia: Hemoglobin stable at 9.3. Thrombocytopenia: Platelets being monitored 106 today  Esophagitis: Resolved, swallowing improved significantly RTC in 1 month  No orders of the defined types were placed in this encounter.  The patient has a good understanding of the overall plan. she agrees with it. she will call with any problems that may develop before the next visit here.  Total time spent: 30 mins including face to face time and time spent for planning, charting and coordination of care  [name], MD 03/13/2020  I, Cloyde Reams Dorshimer, am acting as scribe for [name].  I have reviewed the above documentation for accuracy and completeness, and I agree with the above.

## 2020-03-13 ENCOUNTER — Inpatient Hospital Stay (HOSPITAL_BASED_OUTPATIENT_CLINIC_OR_DEPARTMENT_OTHER): Payer: Medicaid Other | Admitting: Hematology and Oncology

## 2020-03-13 ENCOUNTER — Other Ambulatory Visit: Payer: Self-pay

## 2020-03-13 ENCOUNTER — Inpatient Hospital Stay: Payer: Medicaid Other

## 2020-03-13 DIAGNOSIS — C50512 Malignant neoplasm of lower-outer quadrant of left female breast: Secondary | ICD-10-CM

## 2020-03-13 DIAGNOSIS — Z95828 Presence of other vascular implants and grafts: Secondary | ICD-10-CM

## 2020-03-13 DIAGNOSIS — Z171 Estrogen receptor negative status [ER-]: Secondary | ICD-10-CM | POA: Diagnosis not present

## 2020-03-13 DIAGNOSIS — C5701 Malignant neoplasm of right fallopian tube: Secondary | ICD-10-CM | POA: Diagnosis not present

## 2020-03-13 LAB — CBC WITH DIFFERENTIAL (CANCER CENTER ONLY)
Abs Immature Granulocytes: 0.02 10*3/uL (ref 0.00–0.07)
Basophils Absolute: 0 10*3/uL (ref 0.0–0.1)
Basophils Relative: 0 %
Eosinophils Absolute: 0 10*3/uL (ref 0.0–0.5)
Eosinophils Relative: 0 %
HCT: 29 % — ABNORMAL LOW (ref 36.0–46.0)
Hemoglobin: 9.3 g/dL — ABNORMAL LOW (ref 12.0–15.0)
Immature Granulocytes: 0 %
Lymphocytes Relative: 12 %
Lymphs Abs: 0.7 10*3/uL (ref 0.7–4.0)
MCH: 32.2 pg (ref 26.0–34.0)
MCHC: 32.1 g/dL (ref 30.0–36.0)
MCV: 100.3 fL — ABNORMAL HIGH (ref 80.0–100.0)
Monocytes Absolute: 0.4 10*3/uL (ref 0.1–1.0)
Monocytes Relative: 7 %
Neutro Abs: 5.2 10*3/uL (ref 1.7–7.7)
Neutrophils Relative %: 81 %
Platelet Count: 106 10*3/uL — ABNORMAL LOW (ref 150–400)
RBC: 2.89 MIL/uL — ABNORMAL LOW (ref 3.87–5.11)
RDW: 22.2 % — ABNORMAL HIGH (ref 11.5–15.5)
WBC Count: 6.4 10*3/uL (ref 4.0–10.5)
nRBC: 0 % (ref 0.0–0.2)

## 2020-03-13 LAB — CMP (CANCER CENTER ONLY)
ALT: 13 U/L (ref 0–44)
AST: 19 U/L (ref 15–41)
Albumin: 3.3 g/dL — ABNORMAL LOW (ref 3.5–5.0)
Alkaline Phosphatase: 138 U/L — ABNORMAL HIGH (ref 38–126)
Anion gap: 10 (ref 5–15)
BUN: 7 mg/dL (ref 6–20)
CO2: 20 mmol/L — ABNORMAL LOW (ref 22–32)
Calcium: 8.4 mg/dL — ABNORMAL LOW (ref 8.9–10.3)
Chloride: 107 mmol/L (ref 98–111)
Creatinine: 0.63 mg/dL (ref 0.44–1.00)
GFR, Est AFR Am: >60 mL/min (ref >60)
GFR, Estimated: >60 mL/min (ref >60)
Glucose, Bld: 102 mg/dL — ABNORMAL HIGH (ref 70–99)
Potassium: 3.5 mmol/L (ref 3.5–5.1)
Sodium: 137 mmol/L (ref 135–145)
Total Bilirubin: 0.3 mg/dL (ref 0.3–1.2)
Total Protein: 8 g/dL (ref 6.5–8.1)

## 2020-03-13 MED ORDER — HEPARIN SOD (PORK) LOCK FLUSH 100 UNIT/ML IV SOLN
500.0000 [IU] | Freq: Once | INTRAVENOUS | Status: AC | PRN
  Administered 2020-03-13: 500 [IU]
  Filled 2020-03-13: qty 5

## 2020-03-13 MED ORDER — SODIUM CHLORIDE 0.9% FLUSH
10.0000 mL | INTRAVENOUS | Status: DC | PRN
  Administered 2020-03-13: 10 mL
  Filled 2020-03-13: qty 10

## 2020-03-13 NOTE — Assessment & Plan Note (Signed)
Left lumpectomy: IDC grade 3, 1.2 cm, DCIS is present, margins negative, 0/6 lymph nodes negative, ER 0%, PR 0%, HER-2 positive ratio 2.28, Ki-67 20%, T1 BN 0 stage IA S/P Taxol-Herceptin BRCA 2 Mutation Positive  10/20/19:Bone mets involving Right hip, Bil Iliac bones, T8, T9, L2 and L3, Rt prox humerus, Righ glenoid. Some lytic and some sclerotic. Mild Left supraclav adenopathy, 2 small lesions basal ganglia (could be lacunar infarcts)  Biopsy the supra clav LN: 10/31/2019: Metastatic breast cancer, ER/PR negative HER-2 equivocal by IHC, FISHpositive Treatmentsummary: Kadcyla every 3-week treatment.Discontinued for progression  MRI of the back 01/07/2020: Extensive metastatic disease to the involved spine. Tumor replacement of L3 vertebral body with pathologic compression fracture, epidural and extraosseous extension. Tumor extending throughout L3 foramen Status post palliative radiation therapyCausing severe dysphagia and dehydration and hospitalization5/17/2021-02/03/2020 ---------------------------------------------------------------------------------------------------------------------------------------------------------- Echocardiogram 11/14/2019 Current treatment:Talazoparib started 02/07/2020 Toxicities:  Esophagitis: Resolved RTC in 1 month

## 2020-03-20 ENCOUNTER — Ambulatory Visit (HOSPITAL_COMMUNITY): Payer: Medicaid Other

## 2020-03-27 ENCOUNTER — Ambulatory Visit (HOSPITAL_COMMUNITY): Payer: Medicaid Other

## 2020-04-08 ENCOUNTER — Ambulatory Visit (HOSPITAL_COMMUNITY)
Admission: RE | Admit: 2020-04-08 | Discharge: 2020-04-08 | Disposition: A | Discharge status: Home / Self Care | Payer: Medicare Other | Source: Ambulatory Visit | Attending: Adult Health | Admitting: Adult Health

## 2020-04-08 ENCOUNTER — Encounter (HOSPITAL_COMMUNITY): Payer: Self-pay

## 2020-04-08 ENCOUNTER — Other Ambulatory Visit: Payer: Self-pay

## 2020-04-08 DIAGNOSIS — C5701 Malignant neoplasm of right fallopian tube: Secondary | ICD-10-CM | POA: Diagnosis not present

## 2020-04-08 DIAGNOSIS — Z171 Estrogen receptor negative status [ER-]: Secondary | ICD-10-CM | POA: Insufficient documentation

## 2020-04-08 DIAGNOSIS — C7951 Secondary malignant neoplasm of bone: Secondary | ICD-10-CM | POA: Diagnosis not present

## 2020-04-08 DIAGNOSIS — C50512 Malignant neoplasm of lower-outer quadrant of left female breast: Secondary | ICD-10-CM | POA: Insufficient documentation

## 2020-04-08 MED ORDER — SODIUM CHLORIDE (PF) 0.9 % IJ SOLN
INTRAMUSCULAR | Status: AC
  Filled 2020-04-08: qty 50

## 2020-04-08 MED ORDER — IOHEXOL 300 MG/ML  SOLN
75.0000 mL | Freq: Once | INTRAMUSCULAR | Status: AC | PRN
  Administered 2020-04-08: 75 mL via INTRAVENOUS

## 2020-04-10 NOTE — Progress Notes (Signed)
Patient Care Team: Antony Blackbird, MD as PCP - General (Family Medicine) Excell Seltzer, MD (Inactive) as Consulting Physician (General Surgery) [name], MD as Consulting Physician (Hematology and Oncology) Kyung Rudd, MD as Consulting Physician (Radiation Oncology) [name], MD as Consulting Physician (Gynecologic Oncology) [name], NP as Nurse Practitioner (Hematology and Oncology) Mauro Kaufmann, RN as Oncology Nurse Navigator Rockwell Germany, RN as Oncology Nurse Navigator  DIAGNOSIS:    ICD-10-CM   1. Malignant neoplasm of lower-outer quadrant of left breast of female, estrogen receptor negative ([place])  C50.512 CBC with Differential ([place] Only)   Z17.1 [place] ([place] only)    SUMMARY OF ONCOLOGIC HISTORY: Oncology History  Malignant neoplasm of lower-outer quadrant of left breast of female, estrogen receptor negative ([place])  05/11/2017 Initial Diagnosis   Left breast biopsy 3:30 position 6 cm from nipple: IDC with DCIS, lymphovascular invasion present, grade 2-3, ER 0%, PR 0%, HER-2 positive ratio 2.28, Ki-67 20%, 1 cm lesion left breast, T1b N0 stage IA clinical stage   06/09/2017 Surgery   Left lumpectomy: IDC grade 3, 1.2 cm, DCIS is present, margins negative, 0/6 lymph nodes negative, ER 0%, PR 0%, HER-2 positive ratio 2.28, Ki-67 20%, T1 BN 0 stage IA   07/09/2017 - 09/29/2017 Adjuvant Chemotherapy   Taxol/Herceptin x 12 completed on 09/29/2017, went on to receive every 3 week adjuvant Herceptin through 06/28/2018   11/25/2017 - 01/07/2018 Radiation Therapy    The patient initially received a dose of 50.4 Gy in 28 fractions to the breast using whole-breast tangent fields. This was delivered using a 3-D conformal technique. The patient then received a boost to the seroma. This delivered an additional 10 Gy in 5 fractions using a 3-D technique. The total dose was 60.4 Gy.   02/15/2018 Genetic Testing   The Common Hereditary Cancer Panel  offered by [facility] includes sequencing and/or deletion duplication testing of the following 47 genes: APC, ATM, AXIN2, BARD1, BMPR1A, BRCA1, BRCA2, BRIP1, CDH1, CDKN2A (p14ARF), CDKN2A (p16INK4a), CKD4, CHEK2, CTNNA1, DICER1, EPCAM (Deletion/duplication testing only), GREM1 (promoter region deletion/duplication testing only), KIT, MEN1, MLH1, MSH2, MSH3, MSH6, MUTYH, NBN, NF1, NHTL1, PALB2, PDGFRA, PMS2, POLD1, POLE, PTEN, RAD50, RAD51C, RAD51D, SDHB, SDHC, SDHD, SMAD4, SMARCA4. STK11, TP53, TSC1, TSC2, and VHL.  The following genes were evaluated for sequence changes only: SDHA and HOXB13 c.251G>A variant only.   Results: POSITIVE. 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 The date of this test report is 02/15/2018   10/20/2019 PET scan   Bone mets involving Right hip, Bil Iliac bones, T8, T9, L2 and L3, Rt prox humerus, Righ glenoid. Some lytic and some sclerotic. Mild Left supraclav adenopathy, 2 small lesions basal ganglia (could be lacunar infarcts)   11/14/2019 - 01/15/2020 Chemotherapy   The patient had palonosetron (ALOXI) injection 0.25 mg, 0.25 mg, Intravenous,  Once, 2 of 5 cycles Administration: 0.25 mg (12/05/2019), 0.25 mg (12/26/2019) ado-trastuzumab emtansine (KADCYLA) 260 mg in sodium chloride 0.9 % 250 mL chemo infusion, 3.6 mg/kg = 260 mg, Intravenous, Once, 3 of 6 cycles Administration: 260 mg (11/14/2019), 260 mg (12/05/2019), 260 mg (12/26/2019)  for chemotherapy treatment.    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  Required TPN briefly.  Patient was able to swallow and keep food down by the time of discharge.   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 Some lytic and some sclerotic. Mild Left supraclav adenopathy, 2 small lesions basal ganglia (could be lacunar infarcts)   11/14/2019 - 01/15/2020 Chemotherapy   The patient had palonosetron (ALOXI) injection 0.25 mg, 0.25 mg, Intravenous,  Once, 2 of 5 cycles Administration: 0.25 mg (12/05/2019), 0.25 mg (12/26/2019) ado-trastuzumab emtansine (KADCYLA) 260 mg in sodium chloride 0.9 % 250 mL chemo infusion, 3.6 mg/kg = 260 mg, Intravenous, Once, 3 of 6 cycles Administration: 260 mg (11/14/2019), 260 mg (12/05/2019), 260 mg (12/26/2019)  for chemotherapy treatment.      CHIEF COMPLIANT: Follow-up ofmetastatic breast cancer on talazoparib  INTERVAL HISTORY: Erin [name] is a 59 y.o. with above-mentioned history of metastaticbreast cancerwith bone metastases,and fallopian tube cancer.She is currently on treatment with Talazopariband Xgeva.Chest CT on 04/08/20 showed signs of metastatic disease to bone with increasing sclerosis in areas of previous lytic change and pathologic fracture of the right glenoid associated with metastatic disease to this area. She presents to the clinic todayfor treatment and to review her scan.   ALLERGIES:  has No Known Allergies.  MEDICATIONS:  Current Outpatient Medications  Medication Sig Dispense Refill  . Accu-Chek FastClix Lancets MISC Use as instructed. Inject into the skin twice daily 100 each 3  . atorvastatin (LIPITOR) 40 MG tablet Take 1 tablet (40 mg total) by mouth daily. 90 tablet 1  . Blood Glucose Calibration (ACCU-CHEK GUIDE CONTROL) LIQD 1 each by In Vitro route once as needed for up to 1 dose. 1 each 0  . cholecalciferol (VITAMIN D) 1000 units  tablet Take 1,000 Units by mouth daily.    . ferrous sulfate 325 (65 FE) MG tablet TAKE 1 TABLET (325 MG TOTAL) BY MOUTH DAILY BEFORE BREAKFAST. (Patient not taking: Reported on 02/29/2020) 90 tablet 1  . glimepiride (AMARYL) 2 MG tablet Take 1 tablet (2 mg total) by mouth daily with breakfast. To lower blood sugar 90 tablet 3  . glucose blood (ACCU-CHEK GUIDE) test strip Use as instructed. Check blood glucose by fingerstick twice per day. 100 each 12  . Lancet Devices (ACCU-CHEK SOFTCLIX) lancets Use as instructed 1 each 0  . lidocaine-prilocaine (EMLA) cream Apply 1 application topically as needed. 30 g 0  . metFORMIN (GLUCOPHAGE) 500 MG tablet Take 2 tablets (1,000 mg total) by mouth 2 (two) times daily with a meal. 60 tablet 1  . ondansetron (ZOFRAN ODT) 4 MG disintegrating tablet Take 1 tablet (4 mg total) by mouth every 8 (eight) hours as needed for nausea or vomiting. (Patient not taking: Reported on 02/29/2020) 90 tablet 0  . pantoprazole (PROTONIX) 40 MG tablet Take 1 tablet (40 mg total) by mouth 2 (two) times daily. 60 tablet 1  . talazoparib tosylate (TALZENNA) 1 MG capsule Take 1 capsule (1 mg total) by mouth daily. 30 capsule 3   No current facility-administered medications for this visit.   Facility-Administered Medications Ordered in Other Visits  Medication Dose Route Frequency Provider Last Rate Last Admin  . heparin lock flush 100 unit/mL  500 Units  Intracatheter Once PRN [name], MD      . sodium chloride flush (NS) 0.9 % injection 10 mL  10 mL Intracatheter PRN [name], MD        PHYSICAL EXAMINATION: ECOG PERFORMANCE STATUS: 1 - Symptomatic but completely ambulatory  Vitals:   04/11/20 0914  BP: 123/71  Pulse: 93  Resp: 17  Temp: 98.9 F (37.2 C)  SpO2: 100%   Filed Weights   04/11/20 0914 04/11/20 0915  Weight: 133 lb 4.8 oz (60.5 kg) 132 lb 9.6 oz (60.1 kg)    LABORATORY DATA:  I have reviewed the data as listed CMP Latest Ref Rng & Units  04/11/2020 03/13/2020 03/04/2020  Glucose 70 - 99 mg/dL 86 102(H) 101(H)  BUN 6 - 20 mg/dL _0 Creatinine 0.44 - 1.00 mg/dL 0.67 0.63 0.63  Sodium 135 - 145 mmol/L 141 137 142  Potassium 3.5 - 5.1 mmol/L 3.7 3.5 3.6  Chloride 98 - 111 mmol/L 110 107 110  CO2 22 - 32 mmol/L 22 20(L) 22  Calcium 8.9 - 10.3 mg/dL 9.3 8.4(L) 8.9  Total Protein 6.5 - 8.1 g/dL 7.4 8.0 7.5  Total Bilirubin 0.3 - 1.2 mg/dL 0.4 0.3 <0.2(L)  Alkaline Phos 38 - 126 U/L 106 138(H) 158(H)  AST 15 - 41 U/L _1 ALT 0 - 44 U/L _2 Lab Results  Component Value Date   WBC 3.2 (L) 04/11/2020   HGB 8.7 (L) 04/11/2020   HCT 25.9 (L) 04/11/2020   MCV 98.9 04/11/2020   PLT 12 (L) 04/11/2020   NEUTROABS 2.2 04/11/2020    ASSESSMENT & PLAN:  Malignant neoplasm of lower-outer quadrant of left breast of female, estrogen receptor negative ([facility]) Left lumpectomy: IDC grade 3, 1.2 cm, DCIS is present, margins negative, 0/6 lymph nodes negative, ER 0%, PR 0%, HER-2 positive ratio 2.28, Ki-67 20%, T1 BN 0 stage IA S/P Taxol-Herceptin BRCA 2 Mutation Positive  10/20/19:Bone mets involving Right hip, Bil Iliac bones, T8, T9, L2 and L3, Rt prox humerus, Righ glenoid. Some lytic and some sclerotic. Mild Left supraclav adenopathy, 2 small lesions basal ganglia (could be lacunar infarcts)  Biopsy the supra clav LN: 10/31/2019: Metastatic breast cancer, ER/PR negative HER-2 equivocal by IHC, FISHpositive Treatmentsummary: Kadcyla every 3-week treatment.Discontinued for progression  MRI of the back 01/07/2020: Extensive metastatic disease to the involved spine. Tumor replacement of L3 vertebral body with pathologic compression fracture, epidural and extraosseous extension. Tumor extending throughout L3 foramen Status post palliative radiation therapyCausing severe dysphagia and dehydration and  hospitalization5/17/2021-02/03/2020 ------------------------------------------------------------------------------------------------------------------------------------------------------ Echocardiogram 11/14/2019 Current treatment:Talazoparib started 02/07/2020   Toxicities: Denies any nausea or vomiting Monitoring closely for cytopenias. Anemia: Hemoglobin 8.7. Thrombocytopenia: Platelets are 12 today. Recommended discontinuing talazoparib. Recheck labs in 1 week if they are over 75 then we can resume 0.75 mg dosage.  Esophagitis: Resolved, swallowing improved significantly CT chest: April 08, 2020: Treatment related changes in the bone where there was increased sclerosis of the previous lytic change.  Subtle nondisplaced pathologic fracture of the right glenoid.  Previous liver lesions not well visualized on the current CT scan.  Liver lesion slightly smaller than the previous exam.  Bone metastases: Xgeva every 3 months. Radiology review.:  The scans show response to treatment.  RTC in 1 week for labs and follow-up and injection appointment for Xgeva.  Orders Placed This Encounter  Procedures  . CBC with Differential (Cancer Center Only)    Standing Status:  Future    Standing Expiration Date:   04/11/2021  . CMP ([place] only)    Standing Status:   Future    Standing Expiration Date:   04/11/2021   The patient has a good understanding of the overall plan. she agrees with it. she will call with any problems that may develop before the next visit here.  Total time spent: 30 mins including face to face time and time spent for planning, charting and coordination of care  [name], MD 04/11/2020  I, Cloyde Reams Dorshimer, am acting as scribe for [name].  I have reviewed the above documentation for accuracy and completeness, and I agree with the above.

## 2020-04-11 ENCOUNTER — Inpatient Hospital Stay: Payer: Medicaid Other

## 2020-04-11 ENCOUNTER — Inpatient Hospital Stay: Payer: Medicaid Other | Attending: Gynecologic Oncology | Admitting: Hematology and Oncology

## 2020-04-11 ENCOUNTER — Other Ambulatory Visit: Payer: Self-pay

## 2020-04-11 DIAGNOSIS — C5701 Malignant neoplasm of right fallopian tube: Secondary | ICD-10-CM | POA: Insufficient documentation

## 2020-04-11 DIAGNOSIS — C50512 Malignant neoplasm of lower-outer quadrant of left female breast: Secondary | ICD-10-CM | POA: Insufficient documentation

## 2020-04-11 DIAGNOSIS — Z7984 Long term (current) use of oral hypoglycemic drugs: Secondary | ICD-10-CM | POA: Diagnosis not present

## 2020-04-11 DIAGNOSIS — Z171 Estrogen receptor negative status [ER-]: Secondary | ICD-10-CM | POA: Insufficient documentation

## 2020-04-11 DIAGNOSIS — Z79899 Other long term (current) drug therapy: Secondary | ICD-10-CM | POA: Insufficient documentation

## 2020-04-11 DIAGNOSIS — D649 Anemia, unspecified: Secondary | ICD-10-CM | POA: Diagnosis not present

## 2020-04-11 DIAGNOSIS — Z95828 Presence of other vascular implants and grafts: Secondary | ICD-10-CM

## 2020-04-11 DIAGNOSIS — C7951 Secondary malignant neoplasm of bone: Secondary | ICD-10-CM | POA: Diagnosis present

## 2020-04-11 LAB — CMP (CANCER CENTER ONLY)
ALT: 11 U/L (ref 0–44)
AST: 15 U/L (ref 15–41)
Albumin: 3.5 g/dL (ref 3.5–5.0)
Alkaline Phosphatase: 106 U/L (ref 38–126)
Anion gap: 9 (ref 5–15)
BUN: 8 mg/dL (ref 6–20)
CO2: 22 mmol/L (ref 22–32)
Calcium: 9.3 mg/dL (ref 8.9–10.3)
Chloride: 110 mmol/L (ref 98–111)
Creatinine: 0.67 mg/dL (ref 0.44–1.00)
GFR, Est AFR Am: >60 mL/min (ref >60)
GFR, Estimated: >60 mL/min (ref >60)
Glucose, Bld: 86 mg/dL (ref 70–99)
Potassium: 3.7 mmol/L (ref 3.5–5.1)
Sodium: 141 mmol/L (ref 135–145)
Total Bilirubin: 0.4 mg/dL (ref 0.3–1.2)
Total Protein: 7.4 g/dL (ref 6.5–8.1)

## 2020-04-11 LAB — CBC WITH DIFFERENTIAL (CANCER CENTER ONLY)
Abs Immature Granulocytes: 0 10*3/uL (ref 0.00–0.07)
Basophils Absolute: 0 10*3/uL (ref 0.0–0.1)
Basophils Relative: 0 %
Eosinophils Absolute: 0 10*3/uL (ref 0.0–0.5)
Eosinophils Relative: 1 %
HCT: 25.9 % — ABNORMAL LOW (ref 36.0–46.0)
Hemoglobin: 8.7 g/dL — ABNORMAL LOW (ref 12.0–15.0)
Immature Granulocytes: 0 %
Lymphocytes Relative: 23 %
Lymphs Abs: 0.7 10*3/uL (ref 0.7–4.0)
MCH: 33.2 pg (ref 26.0–34.0)
MCHC: 33.6 g/dL (ref 30.0–36.0)
MCV: 98.9 fL (ref 80.0–100.0)
Monocytes Absolute: 0.2 10*3/uL (ref 0.1–1.0)
Monocytes Relative: 6 %
Neutro Abs: 2.2 10*3/uL (ref 1.7–7.7)
Neutrophils Relative %: 70 %
Platelet Count: 12 10*3/uL — ABNORMAL LOW (ref 150–400)
RBC: 2.62 MIL/uL — ABNORMAL LOW (ref 3.87–5.11)
RDW: 19.5 % — ABNORMAL HIGH (ref 11.5–15.5)
WBC Count: 3.2 10*3/uL — ABNORMAL LOW (ref 4.0–10.5)
nRBC: 0 % (ref 0.0–0.2)

## 2020-04-11 MED ORDER — HEPARIN SOD (PORK) LOCK FLUSH 100 UNIT/ML IV SOLN
500.0000 [IU] | Freq: Once | INTRAVENOUS | Status: AC | PRN
  Administered 2020-04-11: 500 [IU]
  Filled 2020-04-11: qty 5

## 2020-04-11 MED ORDER — SODIUM CHLORIDE 0.9% FLUSH
10.0000 mL | INTRAVENOUS | Status: DC | PRN
  Administered 2020-04-11: 10 mL
  Filled 2020-04-11: qty 10

## 2020-04-11 NOTE — Assessment & Plan Note (Signed)
Left lumpectomy: IDC grade 3, 1.2 cm, DCIS is present, margins negative, 0/6 lymph nodes negative, ER 0%, PR 0%, HER-2 positive ratio 2.28, Ki-67 20%, T1 BN 0 stage IA S/P Taxol-Herceptin BRCA 2 Mutation Positive  10/20/19:Bone mets involving Right hip, Bil Iliac bones, T8, T9, L2 and L3, Rt prox humerus, Righ glenoid. Some lytic and some sclerotic. Mild Left supraclav adenopathy, 2 small lesions basal ganglia (could be lacunar infarcts)  Biopsy the supra clav LN: 10/31/2019: Metastatic breast cancer, ER/PR negative HER-2 equivocal by IHC, FISHpositive Treatmentsummary: Kadcyla every 3-week treatment.Discontinued for progression  MRI of the back 01/07/2020: Extensive metastatic disease to the involved spine. Tumor replacement of L3 vertebral body with pathologic compression fracture, epidural and extraosseous extension. Tumor extending throughout L3 foramen Status post palliative radiation therapyCausing severe dysphagia and dehydration and hospitalization5/17/2021-02/03/2020 ------------------------------------------------------------------------------------------------------------------------------------------------------ Echocardiogram 11/14/2019 Current treatment:Talazoparib started 02/07/2020   Toxicities: Denies any nausea or vomiting Monitoring closely for cytopenias. Anemia: Hemoglobin stable at 9.3. Thrombocytopenia: Platelets being monitored 106 today  Esophagitis: Resolved, swallowing improved significantly CT chest: April 08, 2020: Treatment related changes in the bone where there was increased sclerosis of the previous lytic change.  Subtle nondisplaced pathologic fracture of the right glenoid.  Previous liver lesions not well visualized on the current CT scan.  Liver lesion slightly smaller than the previous exam.  RTC in 1 month.

## 2020-04-17 NOTE — Progress Notes (Signed)
Patient Care Team: Antony Blackbird, MD as PCP - General (Family Medicine) Excell Seltzer, MD (Inactive) as Consulting Physician (General Surgery) [name], MD as Consulting Physician (Hematology and Oncology) Kyung Rudd, MD as Consulting Physician (Radiation Oncology) [name], MD as Consulting Physician (Gynecologic Oncology) [name], NP as Nurse Practitioner (Hematology and Oncology) Mauro Kaufmann, RN as Oncology Nurse Navigator Rockwell Germany, RN as Oncology Nurse Navigator  DIAGNOSIS:    ICD-10-CM   1. Malignant neoplasm of lower-outer quadrant of left breast of female, estrogen receptor negative ([place])  C50.512 CBC with Differential ([place] Only)   Z17.1 [place] ([place] only)    SUMMARY OF ONCOLOGIC HISTORY: Oncology History  Malignant neoplasm of lower-outer quadrant of left breast of female, estrogen receptor negative ([place])  05/11/2017 Initial Diagnosis   Left breast biopsy 3:30 position 6 cm from nipple: IDC with DCIS, lymphovascular invasion present, grade 2-3, ER 0%, PR 0%, HER-2 positive ratio 2.28, Ki-67 20%, 1 cm lesion left breast, T1b N0 stage IA clinical stage   06/09/2017 Surgery   Left lumpectomy: IDC grade 3, 1.2 cm, DCIS is present, margins negative, 0/6 lymph nodes negative, ER 0%, PR 0%, HER-2 positive ratio 2.28, Ki-67 20%, T1 BN 0 stage IA   07/09/2017 - 09/29/2017 Adjuvant Chemotherapy   Taxol/Herceptin x 12 completed on 09/29/2017, went on to receive every 3 week adjuvant Herceptin through 06/28/2018   11/25/2017 - 01/07/2018 Radiation Therapy    The patient initially received a dose of 50.4 Gy in 28 fractions to the breast using whole-breast tangent fields. This was delivered using a 3-D conformal technique. The patient then received a boost to the seroma. This delivered an additional 10 Gy in 5 fractions using a 3-D technique. The total dose was 60.4 Gy.   02/15/2018 Genetic Testing   The Common Hereditary Cancer Panel  offered by [facility] includes sequencing and/or deletion duplication testing of the following 47 genes: APC, ATM, AXIN2, BARD1, BMPR1A, BRCA1, BRCA2, BRIP1, CDH1, CDKN2A (p14ARF), CDKN2A (p16INK4a), CKD4, CHEK2, CTNNA1, DICER1, EPCAM (Deletion/duplication testing only), GREM1 (promoter region deletion/duplication testing only), KIT, MEN1, MLH1, MSH2, MSH3, MSH6, MUTYH, NBN, NF1, NHTL1, PALB2, PDGFRA, PMS2, POLD1, POLE, PTEN, RAD50, RAD51C, RAD51D, SDHB, SDHC, SDHD, SMAD4, SMARCA4. STK11, TP53, TSC1, TSC2, and VHL.  The following genes were evaluated for sequence changes only: SDHA and HOXB13 c.251G>A variant only.   Results: POSITIVE. 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 The date of this test report is 02/15/2018   10/20/2019 PET scan   Bone mets involving Right hip, Bil Iliac bones, T8, T9, L2 and L3, Rt prox humerus, Righ glenoid. Some lytic and some sclerotic. Mild Left supraclav adenopathy, 2 small lesions basal ganglia (could be lacunar infarcts)   11/14/2019 - 01/15/2020 Chemotherapy   The patient had palonosetron (ALOXI) injection 0.25 mg, 0.25 mg, Intravenous,  Once, 2 of 5 cycles Administration: 0.25 mg (12/05/2019), 0.25 mg (12/26/2019) ado-trastuzumab emtansine (KADCYLA) 260 mg in sodium chloride 0.9 % 250 mL chemo infusion, 3.6 mg/kg = 260 mg, Intravenous, Once, 3 of 6 cycles Administration: 260 mg (11/14/2019), 260 mg (12/05/2019), 260 mg (12/26/2019)  for chemotherapy treatment.    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  Required TPN briefly.  Patient was able to swallow and keep food down by the time of discharge.   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 Some lytic and some sclerotic. Mild Left supraclav adenopathy, 2 small lesions basal ganglia (could be lacunar infarcts)   11/14/2019 - 01/15/2020 Chemotherapy   The patient had palonosetron (ALOXI) injection 0.25 mg, 0.25 mg, Intravenous,  Once, 2 of 5 cycles Administration: 0.25 mg (12/05/2019), 0.25 mg (12/26/2019) ado-trastuzumab emtansine (KADCYLA) 260 mg in sodium chloride 0.9 % 250 mL chemo infusion, 3.6 mg/kg = 260 mg, Intravenous, Once, 3 of 6 cycles Administration: 260 mg (11/14/2019), 260 mg (12/05/2019), 260 mg (12/26/2019)  for chemotherapy treatment.      CHIEF COMPLIANT: Follow-up ofmetastatic breast canceron talazoparib  INTERVAL HISTORY: Erin [name] is a 59 y.o. with above-mentioned history of metastaticbreast cancerwith bone metastases,and fallopian tube cancer.She is currently on treatment with Talazopariband Xgeva. She presents to the clinic todayfor treatment.  She has been feeling slightly more tired than before.  She has been eating well and reports no issues with digestion.  ALLERGIES:  has No Known Allergies.  MEDICATIONS:  Current Outpatient Medications  Medication Sig Dispense Refill  . Accu-Chek FastClix Lancets MISC Use as instructed. Inject into the skin twice daily 100 each 3  . atorvastatin (LIPITOR) 40 MG tablet Take 1 tablet (40 mg total) by mouth daily. 90 tablet 1  . Blood Glucose Calibration (ACCU-CHEK GUIDE CONTROL) LIQD 1 each by In Vitro route once as needed for up to 1 dose. 1 each 0  . cholecalciferol (VITAMIN D) 1000 units tablet Take 1,000 Units by mouth daily.    . ferrous sulfate 325 (65 FE) MG tablet TAKE 1 TABLET (325 MG TOTAL) BY MOUTH  DAILY BEFORE BREAKFAST. (Patient not taking: Reported on 02/29/2020) 90 tablet 1  . glimepiride (AMARYL) 2 MG tablet Take 1 tablet (2 mg total) by mouth daily with breakfast. To lower blood sugar 90 tablet 3  . glucose blood (ACCU-CHEK GUIDE) test strip Use as instructed. Check blood glucose by fingerstick twice per day. 100 each 12  . Lancet Devices (ACCU-CHEK SOFTCLIX) lancets Use as instructed 1 each 0  . lidocaine-prilocaine (EMLA) cream Apply 1 application topically as needed. 30 g 0  . metFORMIN (GLUCOPHAGE) 500 MG tablet Take 2 tablets (1,000 mg total) by mouth 2 (two) times daily with a meal. 60 tablet 1  . ondansetron (ZOFRAN ODT) 4 MG disintegrating tablet Take 1 tablet (4 mg total) by mouth every 8 (eight) hours as needed for nausea or vomiting. (Patient not taking: Reported on 02/29/2020) 90 tablet 0  . pantoprazole (PROTONIX) 40 MG tablet Take 1 tablet (40 mg total) by mouth 2 (two) times daily. 60 tablet 1  . talazoparib tosylate (TALZENNA) 1 MG capsule Take 1 capsule (1 mg total) by mouth daily. 30 capsule 3   No current facility-administered medications for this visit.   Facility-Administered Medications Ordered in Other Visits  Medication Dose Route Frequency Provider Last Rate Last Admin  . heparin lock flush 100 unit/mL  500 Units Intracatheter Once PRN [name], MD      . sodium chloride flush (NS) 0.9 %  injection 10 mL  10 mL Intracatheter PRN [name], MD      . sodium chloride flush (NS) 0.9 % injection 10 mL  10 mL Intracatheter PRN [name], MD   10 mL at 04/18/20 0941    PHYSICAL EXAMINATION: ECOG PERFORMANCE STATUS: 1 - Symptomatic but completely ambulatory  Vitals:   04/18/20 0951  BP: 121/70  Pulse: 98  Resp: 17  Temp: 98.5 F (36.9 C)  SpO2: 100%   Filed Weights   04/18/20 0951  Weight: 131 lb 4.8 oz (59.6 kg)    LABORATORY DATA:  I have reviewed the data as listed CMP Latest Ref Rng & Units 04/11/2020 03/13/2020 03/04/2020  Glucose 70 -  99 mg/dL 86 102(H) 101(H)  BUN 6 - 20 mg/dL _0 Creatinine 0.44 - 1.00 mg/dL 0.67 0.63 0.63  Sodium 135 - 145 mmol/L 141 137 142  Potassium 3.5 - 5.1 mmol/L 3.7 3.5 3.6  Chloride 98 - 111 mmol/L 110 107 110  CO2 22 - 32 mmol/L 22 20(L) 22  Calcium 8.9 - 10.3 mg/dL 9.3 8.4(L) 8.9  Total Protein 6.5 - 8.1 g/dL 7.4 8.0 7.5  Total Bilirubin 0.3 - 1.2 mg/dL 0.4 0.3 <0.2(L)  Alkaline Phos 38 - 126 U/L 106 138(H) 158(H)  AST 15 - 41 U/L _1 ALT 0 - 44 U/L _2 Lab Results  Component Value Date   WBC 3.1 (L) 04/18/2020   HGB 8.2 (L) 04/18/2020   HCT 24.4 (L) 04/18/2020   MCV 100.0 04/18/2020   PLT 34 (L) 04/18/2020   NEUTROABS 1.9 04/18/2020    ASSESSMENT & PLAN:  Malignant neoplasm of lower-outer quadrant of left breast of female, estrogen receptor negative ([facility]) Left lumpectomy: IDC grade 3, 1.2 cm, DCIS is present, margins negative, 0/6 lymph nodes negative, ER 0%, PR 0%, HER-2 positive ratio 2.28, Ki-67 20%, T1 BN 0 stage IA S/P Taxol-Herceptin BRCA 2 Mutation Positive  10/20/19:Bone mets involving Right hip, Bil Iliac bones, T8, T9, L2 and L3, Rt prox humerus, Righ glenoid. Some lytic and some sclerotic. Mild Left supraclav adenopathy, 2 small lesions basal ganglia (could be lacunar infarcts)  Biopsy the supra clav LN: 10/31/2019: Metastatic breast cancer, ER/PR negative HER-2 equivocal by IHC, FISHpositive Treatmentsummary: Kadcyla every 3-week treatment.Discontinued for progression  MRI of the back 01/07/2020: Extensive metastatic disease to the involved spine. Tumor replacement of L3 vertebral body with pathologic compression fracture, epidural and extraosseous extension. Tumor extending throughout L3 foramen Status post palliative radiation therapyCausing severe dysphagia and dehydration and  hospitalization5/17/2021-02/03/2020 ------------------------------------------------------------------------------------------------------------------------------------------------------ Echocardiogram 11/14/2019 Current treatment:Talazoparib started 02/07/2020   Toxicities: Denies any nausea or vomiting Monitoring closely for cytopenias. Anemia: Hemoglobin 8.2. Thrombocytopenia: Platelets are 34 Today. I recommend holding talazoparib for another 10 to 12 days.  We will see her on 04/29/2020 and then we can talk about resuming her treatment at 0.75 mg dosage.  CT chest: April 08, 2020: Treatment related changes in the bone where there was increased sclerosis of the previous lytic change.  Subtle nondisplaced pathologic fracture of the right glenoid.  Previous liver lesions not well visualized on the current CT scan.  Liver lesion slightly smaller than the previous exam.  Bone metastases: Xgeva every 3 months.    Orders Placed This Encounter  Procedures  . CBC with Differential (Cancer Center Only)    Standing Status:   Future    Standing Expiration Date:   04/18/2021  . CMP ([place] only)  Standing Status:   Future    Standing Expiration Date:   04/18/2021   The patient has a good understanding of the overall plan. she agrees with it. she will call with any problems that may develop before the next visit here.  Total time spent: 30 mins including face to face time and time spent for planning, charting and coordination of care  [name], MD 04/18/2020  I, Cloyde Reams Dorshimer, am acting as scribe for [name].  I have reviewed the above documentation for accuracy and completeness, and I agree with the above.

## 2020-04-18 ENCOUNTER — Inpatient Hospital Stay: Payer: Medicaid Other

## 2020-04-18 ENCOUNTER — Other Ambulatory Visit: Payer: Self-pay

## 2020-04-18 ENCOUNTER — Inpatient Hospital Stay: Payer: Medicaid Other | Attending: Gynecologic Oncology | Admitting: Hematology and Oncology

## 2020-04-18 DIAGNOSIS — D649 Anemia, unspecified: Secondary | ICD-10-CM | POA: Diagnosis not present

## 2020-04-18 DIAGNOSIS — C5701 Malignant neoplasm of right fallopian tube: Secondary | ICD-10-CM | POA: Insufficient documentation

## 2020-04-18 DIAGNOSIS — K769 Liver disease, unspecified: Secondary | ICD-10-CM | POA: Diagnosis not present

## 2020-04-18 DIAGNOSIS — C50512 Malignant neoplasm of lower-outer quadrant of left female breast: Secondary | ICD-10-CM

## 2020-04-18 DIAGNOSIS — Z79899 Other long term (current) drug therapy: Secondary | ICD-10-CM | POA: Diagnosis not present

## 2020-04-18 DIAGNOSIS — Z7984 Long term (current) use of oral hypoglycemic drugs: Secondary | ICD-10-CM | POA: Insufficient documentation

## 2020-04-18 DIAGNOSIS — Z95828 Presence of other vascular implants and grafts: Secondary | ICD-10-CM

## 2020-04-18 DIAGNOSIS — C7951 Secondary malignant neoplasm of bone: Secondary | ICD-10-CM | POA: Diagnosis not present

## 2020-04-18 DIAGNOSIS — Z171 Estrogen receptor negative status [ER-]: Secondary | ICD-10-CM

## 2020-04-18 DIAGNOSIS — Z923 Personal history of irradiation: Secondary | ICD-10-CM | POA: Diagnosis not present

## 2020-04-18 LAB — CMP (CANCER CENTER ONLY)
ALT: 12 U/L (ref 0–44)
AST: 14 U/L — ABNORMAL LOW (ref 15–41)
Albumin: 3.6 g/dL (ref 3.5–5.0)
Alkaline Phosphatase: 109 U/L (ref 38–126)
Anion gap: 9 (ref 5–15)
BUN: 8 mg/dL (ref 6–20)
CO2: 23 mmol/L (ref 22–32)
Calcium: 9.3 mg/dL (ref 8.9–10.3)
Chloride: 108 mmol/L (ref 98–111)
Creatinine: 0.63 mg/dL (ref 0.44–1.00)
GFR, Est AFR Am: >60 mL/min (ref >60)
GFR, Estimated: >60 mL/min (ref >60)
Glucose, Bld: 91 mg/dL (ref 70–99)
Potassium: 3.9 mmol/L (ref 3.5–5.1)
Sodium: 140 mmol/L (ref 135–145)
Total Bilirubin: 0.3 mg/dL (ref 0.3–1.2)
Total Protein: 7.5 g/dL (ref 6.5–8.1)

## 2020-04-18 LAB — CBC WITH DIFFERENTIAL (CANCER CENTER ONLY)
Abs Immature Granulocytes: 0.01 10*3/uL (ref 0.00–0.07)
Basophils Absolute: 0 10*3/uL (ref 0.0–0.1)
Basophils Relative: 0 %
Eosinophils Absolute: 0 10*3/uL (ref 0.0–0.5)
Eosinophils Relative: 1 %
HCT: 24.4 % — ABNORMAL LOW (ref 36.0–46.0)
Hemoglobin: 8.2 g/dL — ABNORMAL LOW (ref 12.0–15.0)
Immature Granulocytes: 0 %
Lymphocytes Relative: 30 %
Lymphs Abs: 0.9 10*3/uL (ref 0.7–4.0)
MCH: 33.6 pg (ref 26.0–34.0)
MCHC: 33.6 g/dL (ref 30.0–36.0)
MCV: 100 fL (ref 80.0–100.0)
Monocytes Absolute: 0.3 10*3/uL (ref 0.1–1.0)
Monocytes Relative: 10 %
Neutro Abs: 1.9 10*3/uL (ref 1.7–7.7)
Neutrophils Relative %: 59 %
Platelet Count: 34 10*3/uL — ABNORMAL LOW (ref 150–400)
RBC: 2.44 MIL/uL — ABNORMAL LOW (ref 3.87–5.11)
RDW: 18.4 % — ABNORMAL HIGH (ref 11.5–15.5)
WBC Count: 3.1 10*3/uL — ABNORMAL LOW (ref 4.0–10.5)
nRBC: 0 % (ref 0.0–0.2)

## 2020-04-18 MED ORDER — SODIUM CHLORIDE 0.9% FLUSH
10.0000 mL | INTRAVENOUS | Status: DC | PRN
  Administered 2020-04-18: 10 mL
  Filled 2020-04-18: qty 10

## 2020-04-18 MED ORDER — DENOSUMAB 120 MG/1.7ML ~~LOC~~ SOLN
120.0000 mg | Freq: Once | SUBCUTANEOUS | Status: AC
  Administered 2020-04-18: 120 mg via SUBCUTANEOUS

## 2020-04-18 MED ORDER — HEPARIN SOD (PORK) LOCK FLUSH 100 UNIT/ML IV SOLN
500.0000 [IU] | Freq: Once | INTRAVENOUS | Status: AC | PRN
  Administered 2020-04-18: 500 [IU]
  Filled 2020-04-18: qty 5

## 2020-04-18 NOTE — Assessment & Plan Note (Signed)
Left lumpectomy: IDC grade 3, 1.2 cm, DCIS is present, margins negative, 0/6 lymph nodes negative, ER 0%, PR 0%, HER-2 positive ratio 2.28, Ki-67 20%, T1 BN 0 stage IA S/P Taxol-Herceptin BRCA 2 Mutation Positive  10/20/19:Bone mets involving Right hip, Bil Iliac bones, T8, T9, L2 and L3, Rt prox humerus, Righ glenoid. Some lytic and some sclerotic. Mild Left supraclav adenopathy, 2 small lesions basal ganglia (could be lacunar infarcts)  Biopsy the supra clav LN: 10/31/2019: Metastatic breast cancer, ER/PR negative HER-2 equivocal by IHC, FISHpositive Treatmentsummary: Kadcyla every 3-week treatment.Discontinued for progression  MRI of the back 01/07/2020: Extensive metastatic disease to the involved spine. Tumor replacement of L3 vertebral body with pathologic compression fracture, epidural and extraosseous extension. Tumor extending throughout L3 foramen Status post palliative radiation therapyCausing severe dysphagia and dehydration and hospitalization5/17/2021-02/03/2020 ------------------------------------------------------------------------------------------------------------------------------------------------------ Echocardiogram 11/14/2019 Current treatment:Talazoparib started 02/07/2020   Toxicities: Denies any nausea or vomiting Monitoring closely for cytopenias. Anemia: Hemoglobin 8.7. Thrombocytopenia: Platelets are  Today.  CT chest: April 08, 2020: Treatment related changes in the bone where there was increased sclerosis of the previous lytic change.  Subtle nondisplaced pathologic fracture of the right glenoid.  Previous liver lesions not well visualized on the current CT scan.  Liver lesion slightly smaller than the previous exam.  Bone metastases: Xgeva every 3 months.

## 2020-04-18 NOTE — Patient Instructions (Signed)
Denosumab injection What is this medicine? DENOSUMAB (den oh sue mab) slows bone breakdown. Prolia is used to treat osteoporosis in women after menopause and in men, and in people who are taking corticosteroids for 6 months or more. Xgeva is used to treat a high calcium level due to cancer and to prevent bone fractures and other bone problems caused by multiple myeloma or cancer bone metastases. Xgeva is also used to treat giant cell tumor of the bone. This medicine may be used for other purposes; ask your health care provider or pharmacist if you have questions. COMMON BRAND NAME(S): Prolia, XGEVA What should I tell my health care provider before I take this medicine? They need to know if you have any of these conditions:  dental disease  having surgery or tooth extraction  infection  kidney disease  low levels of calcium or Vitamin D in the blood  malnutrition  on hemodialysis  skin conditions or sensitivity  thyroid or parathyroid disease  an unusual reaction to denosumab, other medicines, foods, dyes, or preservatives  pregnant or trying to get pregnant  breast-feeding How should I use this medicine? This medicine is for injection under the skin. It is given by a health care professional in a hospital or clinic setting. A special MedGuide will be given to you before each treatment. Be sure to read this information carefully each time. For Prolia, talk to your pediatrician regarding the use of this medicine in children. Special care may be needed. For Xgeva, talk to your pediatrician regarding the use of this medicine in children. While this drug may be prescribed for children as young as 13 years for selected conditions, precautions do apply. Overdosage: If you think you have taken too much of this medicine contact a poison control center or emergency room at once. NOTE: This medicine is only for you. Do not share this medicine with others. What if I miss a dose? It is  important not to miss your dose. Call your doctor or health care professional if you are unable to keep an appointment. What may interact with this medicine? Do not take this medicine with any of the following medications:  other medicines containing denosumab This medicine may also interact with the following medications:  medicines that lower your chance of fighting infection  steroid medicines like prednisone or cortisone This list may not describe all possible interactions. Give your health care provider a list of all the medicines, herbs, non-prescription drugs, or dietary supplements you use. Also tell them if you smoke, drink alcohol, or use illegal drugs. Some items may interact with your medicine. What should I watch for while using this medicine? Visit your doctor or health care professional for regular checks on your progress. Your doctor or health care professional may order blood tests and other tests to see how you are doing. Call your doctor or health care professional for advice if you get a fever, chills or sore throat, or other symptoms of a cold or flu. Do not treat yourself. This drug may decrease your body's ability to fight infection. Try to avoid being around people who are sick. You should make sure you get enough calcium and vitamin D while you are taking this medicine, unless your doctor tells you not to. Discuss the foods you eat and the vitamins you take with your health care professional. See your dentist regularly. Brush and floss your teeth as directed. Before you have any dental work done, tell your dentist you are   receiving this medicine. Do not become pregnant while taking this medicine or for 5 months after stopping it. Talk with your doctor or health care professional about your birth control options while taking this medicine. Women should inform their doctor if they wish to become pregnant or think they might be pregnant. There is a potential for serious side  effects to an unborn child. Talk to your health care professional or pharmacist for more information. What side effects may I notice from receiving this medicine? Side effects that you should report to your doctor or health care professional as soon as possible:  allergic reactions like skin rash, itching or hives, swelling of the face, lips, or tongue  bone pain  breathing problems  dizziness  jaw pain, especially after dental work  redness, blistering, peeling of the skin  signs and symptoms of infection like fever or chills; cough; sore throat; pain or trouble passing urine  signs of low calcium like fast heartbeat, muscle cramps or muscle pain; pain, tingling, numbness in the hands or feet; seizures  unusual bleeding or bruising  unusually weak or tired Side effects that usually do not require medical attention (report to your doctor or health care professional if they continue or are bothersome):  constipation  diarrhea  headache  joint pain  loss of appetite  muscle pain  runny nose  tiredness  upset stomach This list may not describe all possible side effects. Call your doctor for medical advice about side effects. You may report side effects to FDA at 1-800-FDA-1088. Where should I keep my medicine? This medicine is only given in a clinic, doctor's office, or other health care setting and will not be stored at home. NOTE: This sheet is a summary. It may not cover all possible information. If you have questions about this medicine, talk to your doctor, pharmacist, or health care provider.  2020 Elsevier/Gold Standard (2018-01-07 16:10:44)

## 2020-04-21 NOTE — Progress Notes (Signed)
  Radiation Oncology         [phone] ________________________________  Name: Erin [name] MRN: [id number]  Date: 01/19/2020  DOB: [birth year]  End of Treatment Note  Diagnosis:   bone metastasis     Indication for treatment::  palliative       Radiation treatment dates:   01/01/20 - 01/19/20  Site/dose:   1. The T-spine was treated to a dose of 37.5 Gy in 15 fractions using an isodose plan. This consisted of 2 fields. 2. The right femur was treated to a dose of 37.5 Gy in 15 fractions using an isodose plan. This consisted of 2 fields. 3. The pelvis was treated to a dose of 37.5 Gy in 15 fractions using an isodose plan. This consisted of 4 fields. 4. The L-spine was treated to a dose of 37.5 Gy in 15 fractions using an isodose plan. This consisted of 2 fields.   Narrative: The patient tolerated radiation treatment relatively well.     Plan: The patient has completed radiation treatment. The patient will return to radiation oncology clinic for routine followup in one month. I advised the patient to call or return sooner if they have any questions or concerns related to their recovery or treatment. ________________________________  Jodelle Gross, M.D., Ph.D.

## 2020-04-29 ENCOUNTER — Inpatient Hospital Stay: Payer: Medicaid Other

## 2020-04-29 ENCOUNTER — Inpatient Hospital Stay (HOSPITAL_BASED_OUTPATIENT_CLINIC_OR_DEPARTMENT_OTHER): Payer: Medicaid Other | Admitting: Hematology and Oncology

## 2020-04-29 ENCOUNTER — Other Ambulatory Visit: Payer: Self-pay | Admitting: Pharmacist

## 2020-04-29 ENCOUNTER — Other Ambulatory Visit: Payer: Self-pay

## 2020-04-29 DIAGNOSIS — C50512 Malignant neoplasm of lower-outer quadrant of left female breast: Secondary | ICD-10-CM

## 2020-04-29 DIAGNOSIS — Z171 Estrogen receptor negative status [ER-]: Secondary | ICD-10-CM | POA: Diagnosis not present

## 2020-04-29 DIAGNOSIS — C7951 Secondary malignant neoplasm of bone: Secondary | ICD-10-CM | POA: Diagnosis not present

## 2020-04-29 LAB — CMP (CANCER CENTER ONLY)
ALT: 12 U/L (ref 0–44)
AST: 14 U/L — ABNORMAL LOW (ref 15–41)
Albumin: 3.4 g/dL — ABNORMAL LOW (ref 3.5–5.0)
Alkaline Phosphatase: 110 U/L (ref 38–126)
Anion gap: 9 (ref 5–15)
BUN: 12 mg/dL (ref 6–20)
CO2: 21 mmol/L — ABNORMAL LOW (ref 22–32)
Calcium: 9 mg/dL (ref 8.9–10.3)
Chloride: 111 mmol/L (ref 98–111)
Creatinine: 0.65 mg/dL (ref 0.44–1.00)
GFR, Est AFR Am: >60 mL/min (ref >60)
GFR, Estimated: >60 mL/min (ref >60)
Glucose, Bld: 92 mg/dL (ref 70–99)
Potassium: 4 mmol/L (ref 3.5–5.1)
Sodium: 141 mmol/L (ref 135–145)
Total Bilirubin: 0.3 mg/dL (ref 0.3–1.2)
Total Protein: 7.4 g/dL (ref 6.5–8.1)

## 2020-04-29 LAB — CBC WITH DIFFERENTIAL (CANCER CENTER ONLY)
Abs Immature Granulocytes: 0.02 10*3/uL (ref 0.00–0.07)
Basophils Absolute: 0 10*3/uL (ref 0.0–0.1)
Basophils Relative: 0 %
Eosinophils Absolute: 0 10*3/uL (ref 0.0–0.5)
Eosinophils Relative: 0 %
HCT: 26.3 % — ABNORMAL LOW (ref 36.0–46.0)
Hemoglobin: 8.5 g/dL — ABNORMAL LOW (ref 12.0–15.0)
Immature Granulocytes: 0 %
Lymphocytes Relative: 21 %
Lymphs Abs: 1 10*3/uL (ref 0.7–4.0)
MCH: 34.3 pg — ABNORMAL HIGH (ref 26.0–34.0)
MCHC: 32.3 g/dL (ref 30.0–36.0)
MCV: 106 fL — ABNORMAL HIGH (ref 80.0–100.0)
Monocytes Absolute: 0.6 10*3/uL (ref 0.1–1.0)
Monocytes Relative: 13 %
Neutro Abs: 3.1 10*3/uL (ref 1.7–7.7)
Neutrophils Relative %: 66 %
Platelet Count: 106 10*3/uL — ABNORMAL LOW (ref 150–400)
RBC: 2.48 MIL/uL — ABNORMAL LOW (ref 3.87–5.11)
RDW: 19.3 % — ABNORMAL HIGH (ref 11.5–15.5)
WBC Count: 4.8 10*3/uL (ref 4.0–10.5)
nRBC: 0 % (ref 0.0–0.2)

## 2020-04-29 MED ORDER — TALAZOPARIB TOSYLATE 0.25 MG PO CAPS: 0.7500 mg | ORAL_CAPSULE | Freq: Every day | ORAL | 3 refills | Status: DC

## 2020-04-29 NOTE — Progress Notes (Signed)
Patient Care Team: Antony Blackbird, MD as PCP - General (Family Medicine) Excell Seltzer, MD (Inactive) as Consulting Physician (General Surgery) [name], MD as Consulting Physician (Hematology and Oncology) Kyung Rudd, MD as Consulting Physician (Radiation Oncology) [name], MD as Consulting Physician (Gynecologic Oncology) Delice Bison, Charlestine Massed, NP as Nurse Practitioner (Hematology and Oncology) Mauro Kaufmann, RN as Oncology Nurse Navigator Rockwell Germany, RN as Oncology Nurse Navigator  DIAGNOSIS:  Encounter Diagnosis  Name Primary?  . Malignant neoplasm of lower-outer quadrant of left breast of female, estrogen receptor negative ([place])     SUMMARY OF ONCOLOGIC HISTORY: Oncology History  Malignant neoplasm of lower-outer quadrant of left breast of female, estrogen receptor negative ([place])  05/11/2017 Initial Diagnosis   Left breast biopsy 3:30 position 6 cm from nipple: IDC with DCIS, lymphovascular invasion present, grade 2-3, ER 0%, PR 0%, HER-2 positive ratio 2.28, Ki-67 20%, 1 cm lesion left breast, T1b N0 stage IA clinical stage   06/09/2017 Surgery   Left lumpectomy: IDC grade 3, 1.2 cm, DCIS is present, margins negative, 0/6 lymph nodes negative, ER 0%, PR 0%, HER-2 positive ratio 2.28, Ki-67 20%, T1 BN 0 stage IA   07/09/2017 - 09/29/2017 Adjuvant Chemotherapy   Taxol/Herceptin x 12 completed on 09/29/2017, went on to receive every 3 week adjuvant Herceptin through 06/28/2018   11/25/2017 - 01/07/2018 Radiation Therapy    The patient initially received a dose of 50.4 Gy in 28 fractions to the breast using whole-breast tangent fields. This was delivered using a 3-D conformal technique. The patient then received a boost to the seroma. This delivered an additional 10 Gy in 5 fractions using a 3-D technique. The total dose was 60.4 Gy.   02/15/2018 Genetic Testing   The Common Hereditary Cancer Panel offered by [facility] includes sequencing and/or deletion  duplication testing of the following 47 genes: APC, ATM, AXIN2, BARD1, BMPR1A, BRCA1, BRCA2, BRIP1, CDH1, CDKN2A (p14ARF), CDKN2A (p16INK4a), CKD4, CHEK2, CTNNA1, DICER1, EPCAM (Deletion/duplication testing only), GREM1 (promoter region deletion/duplication testing only), KIT, MEN1, MLH1, MSH2, MSH3, MSH6, MUTYH, NBN, NF1, NHTL1, PALB2, PDGFRA, PMS2, POLD1, POLE, PTEN, RAD50, RAD51C, RAD51D, SDHB, SDHC, SDHD, SMAD4, SMARCA4. STK11, TP53, TSC1, TSC2, and VHL.  The following genes were evaluated for sequence changes only: SDHA and HOXB13 c.251G>A variant only.   Results: POSITIVE. 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 The date of this test report is 02/15/2018   10/20/2019 PET scan   Bone mets involving Right hip, Bil Iliac bones, T8, T9, L2 and L3, Rt prox humerus, Righ glenoid. Some lytic and some sclerotic. Mild Left supraclav adenopathy, 2 small lesions basal ganglia (could be lacunar infarcts)   11/14/2019 - 01/15/2020 Chemotherapy   The patient had palonosetron (ALOXI) injection 0.25 mg, 0.25 mg, Intravenous,  Once, 2 of 5 cycles Administration: 0.25 mg (12/05/2019), 0.25 mg (12/26/2019) ado-trastuzumab emtansine (KADCYLA) 260 mg in sodium chloride 0.9 % 250 mL chemo infusion, 3.6 mg/kg = 260 mg, Intravenous, Once, 3 of 6 cycles Administration: 260 mg (11/14/2019), 260 mg (12/05/2019), 260 mg (12/26/2019)  for chemotherapy treatment.    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  Required TPN briefly.  Patient was able to swallow and keep food down by the time of discharge.   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 Some lytic and some sclerotic. Mild Left supraclav adenopathy, 2 small lesions basal ganglia (could be lacunar infarcts)   11/14/2019 - 01/15/2020 Chemotherapy   The patient had palonosetron (ALOXI) injection 0.25 mg, 0.25 mg, Intravenous,  Once, 2 of 5 cycles Administration: 0.25 mg (12/05/2019), 0.25 mg (12/26/2019) ado-trastuzumab emtansine (KADCYLA) 260 mg in sodium chloride 0.9 % 250 mL chemo infusion, 3.6 mg/kg = 260 mg, Intravenous, Once, 3 of 6 cycles Administration: 260 mg (11/14/2019), 260 mg (12/05/2019), 260 mg (12/26/2019)  for chemotherapy treatment.      CHIEF COMPLIANT: Follow-up to discuss labs  INTERVAL HISTORY: Erin [name] is a 59 year old with above-mentioned for metastatic breast cancer who is currently on talazoparib.  We have to hold talazoparib because of cytopenias.  Today her blood counts have improved.  Platelet count is up to 106.  She is very excited and happy about it.  She reports no new problems or concerns.  Denies any pain or discomfort.   ALLERGIES:  has No Known Allergies.  MEDICATIONS:  Current Outpatient Medications  Medication Sig Dispense Refill  . Accu-Chek FastClix Lancets MISC Use as instructed. Inject into the skin twice daily 100 each 3  . atorvastatin (LIPITOR) 40 MG tablet Take 1 tablet (40 mg total) by mouth daily. 90 tablet 1  . Blood Glucose Calibration (ACCU-CHEK GUIDE CONTROL) LIQD 1 each by In Vitro route once as needed for up to 1 dose. 1 each 0  . cholecalciferol (VITAMIN D) 1000 units tablet Take 1,000 Units by mouth daily.    . ferrous sulfate 325 (65 FE) MG tablet TAKE 1 TABLET (325 MG TOTAL) BY MOUTH DAILY BEFORE BREAKFAST. (Patient not taking: Reported on 02/29/2020) 90 tablet 1  .  glimepiride (AMARYL) 2 MG tablet Take 1 tablet (2 mg total) by mouth daily with breakfast. To lower blood sugar 90 tablet 3  . glucose blood (ACCU-CHEK GUIDE) test strip Use as instructed. Check blood glucose by fingerstick twice per day. 100 each 12  . Lancet Devices (ACCU-CHEK SOFTCLIX) lancets Use as instructed 1 each 0  . lidocaine-prilocaine (EMLA) cream Apply 1 application topically as needed. 30 g 0  . metFORMIN (GLUCOPHAGE) 500 MG tablet Take 2 tablets (1,000 mg total) by mouth 2 (two) times daily with a meal. 60 tablet 1  . ondansetron (ZOFRAN ODT) 4 MG disintegrating tablet Take 1 tablet (4 mg total) by mouth every 8 (eight) hours as needed for nausea or vomiting. (Patient not taking: Reported on 02/29/2020) 90 tablet 0  . pantoprazole (PROTONIX) 40 MG tablet Take 1 tablet (40 mg total) by mouth 2 (two) times daily. 60 tablet 1  . talazoparib tosylate (TALZENNA) 0.25 MG capsule Take 3 capsules (0.75 mg total) by mouth daily. 90 capsule 3   No current facility-administered medications for this visit.   Facility-Administered Medications Ordered in Other Visits  Medication Dose Route Frequency Provider Last Rate Last Admin  . heparin lock flush 100 unit/mL  500 Units Intracatheter Once PRN [name], MD      . sodium chloride flush (NS) 0.9 % injection 10 mL  10 mL Intracatheter PRN [name], MD        PHYSICAL EXAMINATION: ECOG PERFORMANCE STATUS: 1 - Symptomatic but completely ambulatory  Vitals:   04/29/20 0807  BP: 124/71  Pulse: 89  Resp: 18  Temp: (!) 97.4 F (36.3 C)  SpO2: 100%   Filed Weights   04/29/20 0807  Weight: 133 lb 14.4 oz (60.7 kg)     LABORATORY DATA:  I have reviewed the data as listed CMP Latest Ref Rng & Units 04/29/2020 04/18/2020 04/11/2020  Glucose 70 - 99 mg/dL 92 91 86  BUN 6 - 20 mg/dL _0 Creatinine 0.44 - 1.00 mg/dL 0.65 0.63 0.67  Sodium 135 - 145 mmol/L 141 140 141  Potassium 3.5 - 5.1 mmol/L 4.0 3.9 3.7  Chloride 98 - 111  mmol/L 111 108 110  CO2 22 - 32 mmol/L 21(L) 23 22  Calcium 8.9 - 10.3 mg/dL 9.0 9.3 9.3  Total Protein 6.5 - 8.1 g/dL 7.4 7.5 7.4  Total Bilirubin 0.3 - 1.2 mg/dL 0.3 0.3 0.4  Alkaline Phos 38 - 126 U/L 110 109 106  AST 15 - 41 U/L 14(L) 14(L) 15  ALT 0 - 44 U/L _1 Lab Results  Component Value Date   WBC 4.8 04/29/2020   HGB 8.5 (L) 04/29/2020   HCT 26.3 (L) 04/29/2020   MCV 106.0 (H) 04/29/2020   PLT 106 (L) 04/29/2020   NEUTROABS 3.1 04/29/2020    ASSESSMENT & PLAN:  Malignant neoplasm of lower-outer quadrant of left breast of female, estrogen receptor negative ([facility]) Left lumpectomy: IDC grade 3, 1.2 cm, DCIS is present, margins negative, 0/6 lymph nodes negative, ER 0%, PR 0%, HER-2 positive ratio 2.28, Ki-67 20%, T1 BN 0 stage IA S/P Taxol-Herceptin BRCA 2 Mutation Positive  10/20/19:Bone mets involving Right hip, Bil Iliac bones, T8, T9, L2 and L3, Rt prox humerus, Righ glenoid. Some lytic and some sclerotic. Mild Left supraclav adenopathy, 2 small lesions basal ganglia (could be lacunar infarcts)  Biopsy the supra clav LN: 10/31/2019: Metastatic breast cancer, ER/PR negative HER-2 equivocal by IHC, FISHpositive Treatmentsummary: Kadcyla every 3-week treatment.Discontinued for progression  MRI of the back 01/07/2020: Extensive metastatic disease to the involved spine. Tumor replacement of L3 vertebral body with pathologic compression fracture, epidural and extraosseous extension. Tumor extending throughout L3 foramen Status post palliative radiation therapyCausing severe dysphagia and dehydration and hospitalization5/17/2021-02/03/2020 ------------------------------------------------------------------------------------------------------------------------------------------------------ Echocardiogram 11/14/2019 Current treatment:Talazoparib started 02/07/2020   Toxicities: Denies any nausea or vomiting Monitoring closely for cytopenias. Anemia:  Hemoglobin8.5. Thrombocytopenia:Platelets are  106 today. Reducing the dosage of talazoparib to 0.75 mg   I sent a message to our pharmacist to get her medication.  CT chest: April 08, 2020: Treatment related changes in the bone where there was increased sclerosis of the previous lytic change. Subtle nondisplaced pathologic fracture of the right glenoid. Previous liver lesions not well visualized on the current CT scan. Liver lesion slightly smaller than the previous exam.  Bone metastases: Xgeva every 3 months. Return to clinic in 2 weeks for labs and follow-up   No orders of the defined types were placed in this encounter.  The patient has a good understanding of the overall plan. she agrees with it. she will call with any problems that may develop before the next visit here. Total time spent: 30 mins including face to face time and time spent for planning, charting and co-ordination of care   Harriette Ohara, MD 04/29/20

## 2020-04-29 NOTE — Progress Notes (Signed)
Oral Oncology Pharmacist Encounter  Prescription for [facility] sent to [facility] in error. Prescription required to be filled at speciality pharmacy. Prescription redirected to [facility].  Leron Croak, PharmD, BCPS Hematology/Oncology Clinical Pharmacist [facility] [phone] 04/29/2020 8:43 AM

## 2020-04-29 NOTE — Assessment & Plan Note (Signed)
Left lumpectomy: IDC grade 3, 1.2 cm, DCIS is present, margins negative, 0/6 lymph nodes negative, ER 0%, PR 0%, HER-2 positive ratio 2.28, Ki-67 20%, T1 BN 0 stage IA S/P Taxol-Herceptin BRCA 2 Mutation Positive  10/20/19:Bone mets involving Right hip, Bil Iliac bones, T8, T9, L2 and L3, Rt prox humerus, Righ glenoid. Some lytic and some sclerotic. Mild Left supraclav adenopathy, 2 small lesions basal ganglia (could be lacunar infarcts)  Biopsy the supra clav LN: 10/31/2019: Metastatic breast cancer, ER/PR negative HER-2 equivocal by IHC, FISHpositive Treatmentsummary: Kadcyla every 3-week treatment.Discontinued for progression  MRI of the back 01/07/2020: Extensive metastatic disease to the involved spine. Tumor replacement of L3 vertebral body with pathologic compression fracture, epidural and extraosseous extension. Tumor extending throughout L3 foramen Status post palliative radiation therapyCausing severe dysphagia and dehydration and hospitalization5/17/2021-02/03/2020 ------------------------------------------------------------------------------------------------------------------------------------------------------ Echocardiogram 11/14/2019 Current treatment:Talazoparib started 02/07/2020   Toxicities: Denies any nausea or vomiting Monitoring closely for cytopenias. Anemia: Hemoglobin8.2. Thrombocytopenia:Platelets are 34 Today. I recommend holding talazoparib for another 10 to 12 days.  We will see her on 04/29/2020 and then we can talk about resuming her treatment at 0.75 mg dosage.  CT chest: April 08, 2020: Treatment related changes in the bone where there was increased sclerosis of the previous lytic change. Subtle nondisplaced pathologic fracture of the right glenoid. Previous liver lesions not well visualized on the current CT scan. Liver lesion slightly smaller than the previous exam.  Bone metastases: Xgeva every 3 months.

## 2020-05-02 MED FILL — TALZENNA 0.25 MG CAPS: 0.25 | 30 days supply | Qty: 90 | Fill #0

## 2020-05-13 DIAGNOSIS — H5213 Myopia, bilateral: Secondary | ICD-10-CM | POA: Diagnosis not present

## 2020-05-13 DIAGNOSIS — H40023 Open angle with borderline findings, high risk, bilateral: Secondary | ICD-10-CM | POA: Diagnosis not present

## 2020-05-13 DIAGNOSIS — H2513 Age-related nuclear cataract, bilateral: Secondary | ICD-10-CM | POA: Diagnosis not present

## 2020-05-13 DIAGNOSIS — H538 Other visual disturbances: Secondary | ICD-10-CM | POA: Diagnosis not present

## 2020-05-13 DIAGNOSIS — H1045 Other chronic allergic conjunctivitis: Secondary | ICD-10-CM | POA: Diagnosis not present

## 2020-05-13 DIAGNOSIS — H524 Presbyopia: Secondary | ICD-10-CM | POA: Diagnosis not present

## 2020-05-13 DIAGNOSIS — H52223 Regular astigmatism, bilateral: Secondary | ICD-10-CM | POA: Diagnosis not present

## 2020-05-13 DIAGNOSIS — H00021 Hordeolum internum right upper eyelid: Secondary | ICD-10-CM | POA: Diagnosis not present

## 2020-05-13 DIAGNOSIS — E119 Type 2 diabetes mellitus without complications: Secondary | ICD-10-CM | POA: Diagnosis not present

## 2020-05-13 LAB — HM DIABETES EYE EXAM

## 2020-05-16 NOTE — Progress Notes (Signed)
 Patient Care Team: Fulp, Cammie, MD as PCP - General (Family Medicine) Hoxworth, Benjamin, MD (Inactive) as Consulting Physician (General Surgery) , , MD as Consulting Physician (Hematology and Oncology) Moody, John, MD as Consulting Physician (Radiation Oncology) Rossi, Emma, MD as Consulting Physician (Gynecologic Oncology) Causey, Lindsey Cornetto, NP as Nurse Practitioner (Hematology and Oncology) Stuart, Dawn C, RN as Oncology Nurse Navigator Martini, Keisha N, RN as Oncology Nurse Navigator  DIAGNOSIS:    ICD-10-CM   1. Malignant neoplasm of lower-outer quadrant of left breast of female, estrogen receptor negative ([facility])  C50.512    Z17.1     SUMMARY OF ONCOLOGIC HISTORY: Oncology History  Malignant neoplasm of lower-outer quadrant of left breast of female, estrogen receptor negative ([facility])  05/11/2017 Initial Diagnosis   Left breast biopsy 3:30 position 6 cm from nipple: IDC with DCIS, lymphovascular invasion present, grade 2-3, ER 0%, PR 0%, HER-2 positive ratio 2.28, Ki-67 20%, 1 cm lesion left breast, T1b N0 stage IA clinical stage   06/09/2017 Surgery   Left lumpectomy: IDC grade 3, 1.2 cm, DCIS is present, margins negative, 0/6 lymph nodes negative, ER 0%, PR 0%, HER-2 positive ratio 2.28, Ki-67 20%, T1 BN 0 stage IA   07/09/2017 - 09/29/2017 Adjuvant Chemotherapy   Taxol/Herceptin x 12 completed on 09/29/2017, went on to receive every 3 week adjuvant Herceptin through 06/28/2018   11/25/2017 - 01/07/2018 Radiation Therapy    The patient initially received a dose of 50.4 Gy in 28 fractions to the breast using whole-breast tangent fields. This was delivered using a 3-D conformal technique. The patient then received a boost to the seroma. This delivered an additional 10 Gy in 5 fractions using a 3-D technique. The total dose was 60.4 Gy.   02/15/2018 Genetic Testing   The Common Hereditary Cancer Panel offered by [facility] includes sequencing and/or deletion duplication  testing of the following 47 genes: APC, ATM, AXIN2, BARD1, BMPR1A, BRCA1, BRCA2, BRIP1, CDH1, CDKN2A (p14ARF), CDKN2A (p16INK4a), CKD4, CHEK2, CTNNA1, DICER1, EPCAM (Deletion/duplication testing only), GREM1 (promoter region deletion/duplication testing only), KIT, MEN1, MLH1, MSH2, MSH3, MSH6, MUTYH, NBN, NF1, NHTL1, PALB2, PDGFRA, PMS2, POLD1, POLE, PTEN, RAD50, RAD51C, RAD51D, SDHB, SDHC, SDHD, SMAD4, SMARCA4. STK11, TP53, TSC1, TSC2, and VHL.  The following genes were evaluated for sequence changes only: SDHA and HOXB13 c.251G>A variant only.   Results: POSITIVE. 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 The date of this test report is 02/15/2018   10/20/2019 PET scan   Bone mets involving Right hip, Bil Iliac bones, T8, T9, L2 and L3, Rt prox humerus, Righ glenoid. Some lytic and some sclerotic. Mild Left supraclav adenopathy, 2 small lesions basal ganglia (could be lacunar infarcts)   11/14/2019 - 01/15/2020 Chemotherapy   The patient had palonosetron (ALOXI) injection 0.25 mg, 0.25 mg, Intravenous,  Once, 2 of 5 cycles Administration: 0.25 mg (12/05/2019), 0.25 mg (12/26/2019) ado-trastuzumab emtansine (KADCYLA) 260 mg in sodium chloride 0.9 % 250 mL chemo infusion, 3.6 mg/kg = 260 mg, Intravenous, Once, 3 of 6 cycles Administration: 260 mg (11/14/2019), 260 mg (12/05/2019), 260 mg (12/26/2019)  for chemotherapy treatment.    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  Required TPN briefly.  Patient was able to swallow and keep food down by the time of discharge.   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 Some lytic and some sclerotic. Mild Left supraclav adenopathy, 2 small lesions basal ganglia (could be lacunar infarcts)   11/14/2019 - 01/15/2020 Chemotherapy   The patient had palonosetron (ALOXI) injection 0.25 mg, 0.25 mg, Intravenous,  Once, 2 of 5 cycles Administration: 0.25 mg (12/05/2019), 0.25 mg (12/26/2019) ado-trastuzumab emtansine (KADCYLA) 260 mg in sodium chloride 0.9 % 250 mL chemo infusion, 3.6 mg/kg = 260 mg, Intravenous, Once, 3 of 6 cycles Administration: 260 mg (11/14/2019), 260 mg (12/05/2019), 260 mg (12/26/2019)  for chemotherapy treatment.      CHIEF COMPLIANT: Follow-up of metastatic breast cancer  INTERVAL HISTORY: Erin [name] is a 59 y.o. with above-mentioned history of metastatic breast cancer who is currently on talazoparib and Xgeva. She presents to the clinic today for follow-up.   ALLERGIES:  has No Known Allergies.  MEDICATIONS:  Current Outpatient Medications  Medication Sig Dispense Refill  . Accu-Chek FastClix Lancets MISC Use as instructed. Inject into the skin twice daily 100 each 3  . atorvastatin (LIPITOR) 40 MG tablet Take 1 tablet (40 mg total) by mouth daily. 90 tablet 1  . Blood Glucose Calibration (ACCU-CHEK GUIDE CONTROL) LIQD 1 each by In Vitro route once as needed for up to 1 dose. 1 each 0  . cholecalciferol (VITAMIN D) 1000 units tablet Take 1,000 Units by mouth daily.    . ferrous sulfate 325 (65 FE) MG tablet TAKE 1 TABLET (325 MG TOTAL) BY MOUTH DAILY BEFORE BREAKFAST. (Patient not taking: Reported on 02/29/2020) 90 tablet 1  . glimepiride (AMARYL) 2 MG tablet Take 1 tablet (2 mg total) by mouth daily with breakfast. To lower blood sugar 90 tablet 3  . glucose blood (ACCU-CHEK GUIDE) test strip Use as  instructed. Check blood glucose by fingerstick twice per day. 100 each 12  . Lancet Devices (ACCU-CHEK SOFTCLIX) lancets Use as instructed 1 each 0  . lidocaine-prilocaine (EMLA) cream Apply 1 application topically as needed. 30 g 0  . metFORMIN (GLUCOPHAGE) 500 MG tablet Take 2 tablets (1,000 mg total) by mouth 2 (two) times daily with a meal. 60 tablet 1  . ondansetron (ZOFRAN ODT) 4 MG disintegrating tablet Take 1 tablet (4 mg total) by mouth every 8 (eight) hours as needed for nausea or vomiting. (Patient not taking: Reported on 02/29/2020) 90 tablet 0  . pantoprazole (PROTONIX) 40 MG tablet Take 1 tablet (40 mg total) by mouth 2 (two) times daily. 60 tablet 1  . talazoparib tosylate (TALZENNA) 0.25 MG capsule Take 3 capsules (0.75 mg total) by mouth daily. 90 capsule 3   No current facility-administered medications for this visit.   Facility-Administered Medications Ordered in Other Visits  Medication Dose Route Frequency Provider Last Rate Last Admin  . heparin lock flush 100 unit/mL  500 Units Intracatheter Once PRN [name], MD      . sodium chloride flush (NS) 0.9 % injection 10 mL  10 mL Intracatheter PRN [name], MD        PHYSICAL EXAMINATION: ECOG PERFORMANCE STATUS: 1 - Symptomatic but completely ambulatory  Vitals:  05/17/20 0839  BP: 130/71  Pulse: 80  Resp: 18  Temp: (!) 96.9 F (36.1 C)  SpO2: 100%   Filed Weights   05/17/20 0839  Weight: 136 lb 9.6 oz (62 kg)    LABORATORY DATA:  I have reviewed the data as listed CMP Latest Ref Rng & Units 04/29/2020 04/18/2020 04/11/2020  Glucose 70 - 99 mg/dL 92 91 86  BUN 6 - 20 mg/dL _0 Creatinine 0.44 - 1.00 mg/dL 0.65 0.63 0.67  Sodium 135 - 145 mmol/L 141 140 141  Potassium 3.5 - 5.1 mmol/L 4.0 3.9 3.7  Chloride 98 - 111 mmol/L 111 108 110  CO2 22 - 32 mmol/L 21(L) 23 22  Calcium 8.9 - 10.3 mg/dL 9.0 9.3 9.3  Total Protein 6.5 - 8.1 g/dL 7.4 7.5 7.4  Total Bilirubin 0.3 - 1.2 mg/dL 0.3 0.3 0.4    Alkaline Phos 38 - 126 U/L 110 109 106  AST 15 - 41 U/L 14(L) 14(L) 15  ALT 0 - 44 U/L _1 Lab Results  Component Value Date   WBC 5.5 05/17/2020   HGB 9.3 (L) 05/17/2020   HCT 29.0 (L) 05/17/2020   MCV 107.0 (H) 05/17/2020   PLT 103 (L) 05/17/2020   NEUTROABS 3.9 05/17/2020    ASSESSMENT & PLAN:  Malignant neoplasm of lower-outer quadrant of left breast of female, estrogen receptor negative ([facility]) Left lumpectomy: IDC grade 3, 1.2 cm, DCIS is present, margins negative, 0/6 lymph nodes negative, ER 0%, PR 0%, HER-2 positive ratio 2.28, Ki-67 20%, T1 BN 0 stage IA S/P Taxol-Herceptin BRCA 2 Mutation Positive  10/20/19:Bone mets involving Right hip, Bil Iliac bones, T8, T9, L2 and L3, Rt prox humerus, Righ glenoid. Some lytic and some sclerotic. Mild Left supraclav adenopathy, 2 small lesions basal ganglia (could be lacunar infarcts)  Biopsy the supra clav LN: 10/31/2019: Metastatic breast cancer, ER/PR negative HER-2 equivocal by IHC, FISHpositive Treatmentsummary: Kadcyla every 3-week treatment.Discontinued for progression  MRI of the back 01/07/2020: Extensive metastatic disease to the involved spine. Tumor replacement of L3 vertebral body with pathologic compression fracture, epidural and extraosseous extension. Tumor extending throughout L3 foramen Status post palliative radiation therapyCausing severe dysphagia and dehydration and hospitalization5/17/2021-02/03/2020 ------------------------------------------------------------------------------------------------------------------------------------------------------ Echocardiogram 11/14/2019 Current treatment:Talazoparib started 02/07/2020   Toxicities: Denies any nausea or vomiting Monitoring closely for cytopenias. Anemia: Hemoglobinimproved to 9.3 Thrombocytopenia:Platelets are106 today. Reduced the dosage of talazoparib to 0.75 mg     CT chest: April 08, 2020: Treatment related changes in the bone where  there was increased sclerosis of the previous lytic change. Subtle nondisplaced pathologic fracture of the right glenoid. Previous liver lesions not well visualized on the current CT scan. Liver lesion slightly smaller than the previous exam. Plan to do another CT scan in November  Bone metastases: Xgeva every 3 months. Return to clinic in 4 weeks for labs and follow-up    No orders of the defined types were placed in this encounter.  The patient has a good understanding of the overall plan. she agrees with it. she will call with any problems that may develop before the next visit here.  Total time spent: 30 mins including face to face time and time spent for planning, charting and coordination of care  [name], MD 05/17/2020  I, Cloyde Reams Dorshimer, am acting as scribe for [name].  I have reviewed the above documentation for accuracy and completeness, and I agree with the above.

## 2020-05-16 NOTE — Assessment & Plan Note (Signed)
Left lumpectomy: IDC grade 3, 1.2 cm, DCIS is present, margins negative, 0/6 lymph nodes negative, ER 0%, PR 0%, HER-2 positive ratio 2.28, Ki-67 20%, T1 BN 0 stage IA S/P Taxol-Herceptin BRCA 2 Mutation Positive  10/20/19:Bone mets involving Right hip, Bil Iliac bones, T8, T9, L2 and L3, Rt prox humerus, Righ glenoid. Some lytic and some sclerotic. Mild Left supraclav adenopathy, 2 small lesions basal ganglia (could be lacunar infarcts)  Biopsy the supra clav LN: 10/31/2019: Metastatic breast cancer, ER/PR negative HER-2 equivocal by IHC, FISHpositive Treatmentsummary: Kadcyla every 3-week treatment.Discontinued for progression  MRI of the back 01/07/2020: Extensive metastatic disease to the involved spine. Tumor replacement of L3 vertebral body with pathologic compression fracture, epidural and extraosseous extension. Tumor extending throughout L3 foramen Status post palliative radiation therapyCausing severe dysphagia and dehydration and hospitalization5/17/2021-02/03/2020 ------------------------------------------------------------------------------------------------------------------------------------------------------ Echocardiogram 11/14/2019 Current treatment:Talazoparib started 02/07/2020   Toxicities: Denies any nausea or vomiting Monitoring closely for cytopenias. Anemia: Hemoglobin8.5. Thrombocytopenia:Platelets are 106 today. Reducing the dosage of talazoparib to 0.75 mg   I sent a message to our pharmacist to get her medication.  CT chest: April 08, 2020: Treatment related changes in the bone where there was increased sclerosis of the previous lytic change. Subtle nondisplaced pathologic fracture of the right glenoid. Previous liver lesions not well visualized on the current CT scan. Liver lesion slightly smaller than the previous exam.  Bone metastases: Xgeva every 3 months. Return to clinic in 4 weeks for labs and follow-up

## 2020-05-17 ENCOUNTER — Inpatient Hospital Stay (HOSPITAL_BASED_OUTPATIENT_CLINIC_OR_DEPARTMENT_OTHER): Payer: Medicare Other | Admitting: Hematology and Oncology

## 2020-05-17 ENCOUNTER — Other Ambulatory Visit: Payer: Self-pay

## 2020-05-17 ENCOUNTER — Telehealth: Payer: Self-pay | Admitting: Hematology and Oncology

## 2020-05-17 ENCOUNTER — Inpatient Hospital Stay: Payer: Medicare Other | Attending: Gynecologic Oncology

## 2020-05-17 DIAGNOSIS — Z833 Family history of diabetes mellitus: Secondary | ICD-10-CM | POA: Diagnosis not present

## 2020-05-17 DIAGNOSIS — C7951 Secondary malignant neoplasm of bone: Secondary | ICD-10-CM | POA: Insufficient documentation

## 2020-05-17 DIAGNOSIS — E785 Hyperlipidemia, unspecified: Secondary | ICD-10-CM | POA: Insufficient documentation

## 2020-05-17 DIAGNOSIS — D5701 Hb-SS disease with acute chest syndrome: Secondary | ICD-10-CM | POA: Insufficient documentation

## 2020-05-17 DIAGNOSIS — Z803 Family history of malignant neoplasm of breast: Secondary | ICD-10-CM | POA: Diagnosis not present

## 2020-05-17 DIAGNOSIS — Z800 Family history of malignant neoplasm of digestive organs: Secondary | ICD-10-CM | POA: Diagnosis not present

## 2020-05-17 DIAGNOSIS — Z8349 Family history of other endocrine, nutritional and metabolic diseases: Secondary | ICD-10-CM | POA: Diagnosis not present

## 2020-05-17 DIAGNOSIS — C50512 Malignant neoplasm of lower-outer quadrant of left female breast: Secondary | ICD-10-CM

## 2020-05-17 DIAGNOSIS — Z171 Estrogen receptor negative status [ER-]: Secondary | ICD-10-CM | POA: Diagnosis not present

## 2020-05-17 DIAGNOSIS — Z79899 Other long term (current) drug therapy: Secondary | ICD-10-CM | POA: Insufficient documentation

## 2020-05-17 DIAGNOSIS — Z170 Estrogen receptor positive status [ER+]: Secondary | ICD-10-CM | POA: Insufficient documentation

## 2020-05-17 DIAGNOSIS — C5701 Malignant neoplasm of right fallopian tube: Secondary | ICD-10-CM | POA: Diagnosis present

## 2020-05-17 DIAGNOSIS — E119 Type 2 diabetes mellitus without complications: Secondary | ICD-10-CM | POA: Insufficient documentation

## 2020-05-17 DIAGNOSIS — I10 Essential (primary) hypertension: Secondary | ICD-10-CM | POA: Diagnosis not present

## 2020-05-17 DIAGNOSIS — G893 Neoplasm related pain (acute) (chronic): Secondary | ICD-10-CM | POA: Insufficient documentation

## 2020-05-17 DIAGNOSIS — Z8249 Family history of ischemic heart disease and other diseases of the circulatory system: Secondary | ICD-10-CM | POA: Insufficient documentation

## 2020-05-17 LAB — CMP (CANCER CENTER ONLY)
ALT: 10 U/L (ref 0–44)
AST: 15 U/L (ref 15–41)
Albumin: 3.5 g/dL (ref 3.5–5.0)
Alkaline Phosphatase: 102 U/L (ref 38–126)
Anion gap: 11 (ref 5–15)
BUN: 12 mg/dL (ref 6–20)
CO2: 20 mmol/L — ABNORMAL LOW (ref 22–32)
Calcium: 8.7 mg/dL — ABNORMAL LOW (ref 8.9–10.3)
Chloride: 107 mmol/L (ref 98–111)
Creatinine: 0.66 mg/dL (ref 0.44–1.00)
GFR, Est AFR Am: >60 mL/min (ref >60)
GFR, Estimated: >60 mL/min (ref >60)
Glucose, Bld: 121 mg/dL — ABNORMAL HIGH (ref 70–99)
Potassium: 3.8 mmol/L (ref 3.5–5.1)
Sodium: 138 mmol/L (ref 135–145)
Total Bilirubin: <0.2 mg/dL — ABNORMAL LOW (ref 0.3–1.2)
Total Protein: 7.7 g/dL (ref 6.5–8.1)

## 2020-05-17 LAB — CBC WITH DIFFERENTIAL (CANCER CENTER ONLY)
Abs Immature Granulocytes: 0.01 10*3/uL (ref 0.00–0.07)
Basophils Absolute: 0 10*3/uL (ref 0.0–0.1)
Basophils Relative: 0 %
Eosinophils Absolute: 0 10*3/uL (ref 0.0–0.5)
Eosinophils Relative: 1 %
HCT: 29 % — ABNORMAL LOW (ref 36.0–46.0)
Hemoglobin: 9.3 g/dL — ABNORMAL LOW (ref 12.0–15.0)
Immature Granulocytes: 0 %
Lymphocytes Relative: 19 %
Lymphs Abs: 1 10*3/uL (ref 0.7–4.0)
MCH: 34.3 pg — ABNORMAL HIGH (ref 26.0–34.0)
MCHC: 32.1 g/dL (ref 30.0–36.0)
MCV: 107 fL — ABNORMAL HIGH (ref 80.0–100.0)
Monocytes Absolute: 0.6 10*3/uL (ref 0.1–1.0)
Monocytes Relative: 10 %
Neutro Abs: 3.9 10*3/uL (ref 1.7–7.7)
Neutrophils Relative %: 70 %
Platelet Count: 103 10*3/uL — ABNORMAL LOW (ref 150–400)
RBC: 2.71 MIL/uL — ABNORMAL LOW (ref 3.87–5.11)
RDW: 17.7 % — ABNORMAL HIGH (ref 11.5–15.5)
WBC Count: 5.5 10*3/uL (ref 4.0–10.5)
nRBC: 0 % (ref 0.0–0.2)

## 2020-05-17 NOTE — Telephone Encounter (Signed)
Scheduled appts per 9/3 los. Gave pt a print out of AVS.

## 2020-05-27 ENCOUNTER — Other Ambulatory Visit: Payer: Self-pay | Admitting: Medical

## 2020-05-27 ENCOUNTER — Other Ambulatory Visit: Payer: Self-pay

## 2020-05-27 ENCOUNTER — Telehealth: Payer: Self-pay | Admitting: *Deleted

## 2020-05-27 ENCOUNTER — Inpatient Hospital Stay (HOSPITAL_BASED_OUTPATIENT_CLINIC_OR_DEPARTMENT_OTHER): Payer: Medicare Other | Admitting: Medical

## 2020-05-27 ENCOUNTER — Ambulatory Visit (HOSPITAL_COMMUNITY)
Admission: RE | Admit: 2020-05-27 | Discharge: 2020-05-27 | Disposition: A | Discharge status: Home / Self Care | Payer: Medicaid Other | Source: Ambulatory Visit | Attending: Medical | Admitting: Medical

## 2020-05-27 VITALS — BP 130/71 | HR 92 | Temp 97.2°F | Resp 18 | Ht 61.0 in | Wt 136.2 lb

## 2020-05-27 DIAGNOSIS — Z171 Estrogen receptor negative status [ER-]: Secondary | ICD-10-CM

## 2020-05-27 DIAGNOSIS — C7951 Secondary malignant neoplasm of bone: Secondary | ICD-10-CM

## 2020-05-27 DIAGNOSIS — M25552 Pain in left hip: Secondary | ICD-10-CM

## 2020-05-27 DIAGNOSIS — C50512 Malignant neoplasm of lower-outer quadrant of left female breast: Secondary | ICD-10-CM | POA: Insufficient documentation

## 2020-05-27 DIAGNOSIS — G893 Neoplasm related pain (acute) (chronic): Secondary | ICD-10-CM | POA: Diagnosis not present

## 2020-05-27 MED ORDER — HYDROCODONE-ACETAMINOPHEN 5-325 MG PO TABS: ORAL_TABLET | ORAL | 0 refills | Status: DC

## 2020-05-27 NOTE — Telephone Encounter (Signed)
Received call from pt with complaint of left hip pain x3 days.  Pt denies recent injury or trauma.  Pt states pain is interfering with ADLs and is having difficulty standing and sitting.  Pain not relieved by OTC tylenol.  Per MD pt needing to be seen in [facility] for further evaluation and tx.  High priority message sent to scheduling.  pt verbalized understanding.

## 2020-05-28 ENCOUNTER — Telehealth: Payer: Self-pay | Admitting: Radiation Oncology

## 2020-05-28 MED FILL — TALZENNA 0.25 MG CAPS: 0.25 | 30 days supply | Qty: 90 | Fill #1

## 2020-05-28 NOTE — Telephone Encounter (Signed)
  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    Interval Since Last Radiation: 4 months  01/01/20-01/19/20: The bilateral iliac bones, right proximal femur, and L2-3 and T8 sites were each treated to 37.5 Gy in 15 fractions.  11/25/2017 - 01/10/2018: The patient initially received a dose of 50.4 Gy in 28 fractions to the breast using whole-breast tangent fields. This was delivered using a 3-D conformal technique. The patient then received a boost to the seroma. This delivered an additional 10 Gy in 5 fractions using a 3-D technique. The total dose was 60.4 Gy.  Narrative:  The patient was contacted today for routine follow-up. She is well known to our department and has a history of recurrent metastatic breast cancer with disease in the bones. She had recent treatment to multiple sites, but recently developed progressive pain in her left hip area radiating into her groin. Plain films in June did not show any visible disease, but the pain has persisted and films from yesterday revealed multiple sclerotic foci in the pelvis and left femoral neck. She is currently taking a PARP inhibitor and Dr. [name] has requested we consider further radiotherapy to the left hip. He recommends discontinuing the PARP inhibitor during radiotherapy.  Today I called the patient to share this with her and she reports her pain in her left hip comes and goes, today it's slightly better than yesterday, but she states that even with pain medication it flares and is quite uncomfortable. She has not had any falls or known trauma.    Impression/Plan: 1. Recurrent Metatastatic Stage IA, pT1cN0M0 grade 3, ER/PR negative, HER2 amplified, invasive  ductal carcinoma with DCIS of the left breast with bone metastases. Dr. Lisbeth Renshaw has reviewed her case and has recommended a course of palliative radiotherapy over 10 fractions to the left femur. She is interested in proceeding after we reviewed the risks, benefits, short, and long term effects of treatment. She is willing to come in tomorrow for simulation and will sign consent at that time. She is also aware of the need to discontinue her PARP inhibitor during treatment. Her mother was also contacted separately who provides her transportation and regularly attends appointments with Ms. Kirst. A sign language interpretor service was utilize for translation with the patient by phone.    Carola Rhine, PAC

## 2020-05-29 ENCOUNTER — Ambulatory Visit
Admission: RE | Admit: 2020-05-29 | Discharge: 2020-05-29 | Disposition: A | Discharge status: Home / Self Care | Payer: Medicare Other | Source: Ambulatory Visit | Attending: Radiation Oncology | Admitting: Radiation Oncology

## 2020-05-29 ENCOUNTER — Other Ambulatory Visit: Payer: Self-pay

## 2020-05-29 DIAGNOSIS — Z510 Encounter for antineoplastic radiation therapy: Secondary | ICD-10-CM | POA: Diagnosis not present

## 2020-05-29 DIAGNOSIS — C50512 Malignant neoplasm of lower-outer quadrant of left female breast: Secondary | ICD-10-CM | POA: Insufficient documentation

## 2020-05-29 DIAGNOSIS — Z171 Estrogen receptor negative status [ER-]: Secondary | ICD-10-CM | POA: Diagnosis not present

## 2020-05-29 DIAGNOSIS — C7951 Secondary malignant neoplasm of bone: Secondary | ICD-10-CM | POA: Diagnosis not present

## 2020-05-30 NOTE — Progress Notes (Signed)
Symptoms Management Clinic Progress Note   Erin [name] [id number] October 29, 1960 59 y.o.  [name] is managed by [name]  Actively treated with chemotherapy/immunotherapy/hormonal therapy: yes  Current therapy: Talzenna  Next scheduled appointment with provider: 06/14/2020  Assessment: Plan:    Neoplasm related pain - Plan: HYDROcodone-acetaminophen (NORCO/VICODIN) 5-325 MG tablet  Acute hip pain, left - Plan: HYDROcodone-acetaminophen (NORCO/VICODIN) 5-325 MG tablet  Malignant neoplasm of lower-outer quadrant of left breast of female, estrogen receptor negative ([place])  Bone metastases ([place])   Left hip pain: Patient was referred for an x-ray of her left hip and pelvis today which returned showing:  FINDINGS: There is no evidence of hip fracture or dislocation.  There is no  evidence of arthropathy. Multiple sclerotic foci are noted throughout the pelvis, particularly in the right iliac wing and the ischial tuberosities and inferior rami bilaterally.  Also noted is a solitary focus in the left femoral neck.  These are concerning for metastatic disease given the history of metastatic breast cancer.  IMPRESSION: Multiple sclerotic foci are noted throughout the pelvis and left femoral neck concerning for metastatic disease.  No acute fracture or dislocation is noted.  She was given a prescription for Vicodin and was referred back to Dr. Kyung Rudd for consideration of radiation therapy.  She was given information and was instructed to begin a bowel regiment.  Metastatic ER positive malignant neoplasm of the left breast with bone metastasis: The patient continues to be managed by Dr. Payton Mccallum and is currently treated with oral Talzenna.  She is scheduled to return for follow-up on 06/14/2020.   Please see After Visit Summary for patient specific instructions.  Future Appointments  Date Time Provider [place]  06/03/2020  4:20 PM Kyung Rudd, MD  [facility] None  06/04/2020 10:00 AM [facility] [id number] [facility] None  06/05/2020 10:00 AM [facility] [id number] [facility] None  06/06/2020 10:00 AM [facility] [id number] [facility] None  06/07/2020 10:00 AM [facility] [id number] [facility] None  06/10/2020 10:00 AM [facility] [id number] [facility] None  06/11/2020 10:00 AM [facility] [id number] [facility] None  06/12/2020 10:00 AM [facility] [id number] [facility] None  06/13/2020 10:00 AM [facility] [id number] [facility] None  06/14/2020  8:15 AM [facility] LAB [facility] None  06/14/2020  8:30 AM [facility] [place] FLUSH [facility] None  06/14/2020  9:00 AM [name], MD [facility] None  06/14/2020  9:45 AM [facility] [id number] [facility] None    No orders of the defined types were placed in this encounter.      Subjective:   Patient ID:  Erin [name] is a 59 y.o. (DOB 03/04/1961) female.  Chief Complaint: No chief complaint on file.   HPI Erin [name] is a 59 y.o. female with a diagnosis of an ER positive malignant neoplasm of the left breast with bone metastasis. She continues to be managed by Dr. Payton Mccallum and is currently treated with oral Talzenna.  She presents to the clinic today with a several day history of left hip pain.  She has had no changes in activity and denies trauma.  She was seen today with a sign language interpreter.  Patient denies any other acute issues of concern today.  She has been taking Tylenol for her pain without benefit.  An x-ray of her hips was completed today which returned showing:  FINDINGS: There is no evidence of hip fracture or dislocation.  There is no  evidence of arthropathy. Multiple sclerotic foci are noted throughout the pelvis, particularly in the right iliac wing and the  ischial tuberosities and inferior rami bilaterally.  Also noted is a solitary focus in the left femoral neck.  These are concerning for metastatic disease given the history of metastatic breast  cancer.  IMPRESSION: Multiple sclerotic foci are noted throughout the pelvis and left femoral neck concerning for metastatic disease.  No acute fracture or dislocation is noted.   Medications: I have reviewed the patient's current medications.  Allergies: No Known Allergies  Past Medical History:  Diagnosis Date  . Arthritis    knees, elbows  . Borderline glaucoma of right eye   . BRCA2 gene mutation positive 02/17/2018   BRCA2 c.4552del (p.Glu1518Asnfs*25)  Result reported out on 02/15/2018.   . Breast cancer ([place]) 05/2017   left  . Chronic back pain   . Deaf    per pt born hearing and at age 76 1/2 lost hearing , was told by mother unknown cause, can miminally hear in left and no hearing on right   . Depression   . Dyspnea    states some SOB with ADLs and anemia   . Elevated cancer antigen 125 (CA 125)   . Family history of breast cancer   . Family history of breast cancer   . Family history of colon cancer   . Genetic testing 02/17/2018   The Common Hereditary Cancer Panel offered by [facility] includes sequencing and/or deletion duplication testing of the following 47 genes: APC, ATM, AXIN2, BARD1, BMPR1A, BRCA1, BRCA2, BRIP1, CDH1, CDKN2A (p14ARF), CDKN2A (p16INK4a), CKD4, CHEK2, CTNNA1, DICER1, EPCAM (Deletion/duplication testing only), GREM1 (promoter region deletion/duplication testing only), KIT, MEN1, MLH1, MSH2, MSH3, MSH6, MU  . History of cancer chemotherapy    left breast cancer 09-08-2017 to 10-09-2017;  fallopion tube cancer  12-15-2017  to 06-28-2018  . 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 History of external beam radiation therapy    left breast  11-25-2017  to 01-07-2018  . Hyperlipidemia   . Hypertension    followed by pcp   (10-05-2019  per pt never had stress test)  . IDA (iron deficiency  anemia)   . Malignant neoplasm of lower-outer quadrant of left breast of female, estrogen receptor negative [facility]) oncologist-- dr [name]   dx 08/ 2018--- Stage IA, DCIS,  ER/ PR negative,  HER-2 positive;  06-09-2017 s/p left breast lumpectomy with node dissection;  completed chemo 10-09-2017  and radiation 01-07-2018/  hercepton completed 06-28-2018  . Non-insulin dependent type 2 diabetes mellitus ([place])    followed by pcp   (10-05-2019 per pt check cbg every other day in AM,  fasting cbg-- 105)  . Numbness of right thumb   . Wears glasses     Past Surgical History:  Procedure Laterality Date  . BREAST LUMPECTOMY WITH RADIOACTIVE SEED AND SENTINEL LYMPH NODE BIOPSY Left 06/09/2017   Procedure: BREAST LUMPECTOMY WITH RADIOACTIVE SEED AND SENTINEL LYMPH NODE BIOPSY;  Surgeon: Excell Seltzer, MD;  Location: [facility];  Service: General;  Laterality: Left;  . COLONOSCOPY  11/02/2017   polyps  . HERNIA REPAIR    . HIATAL HERNIA REPAIR  09/2019  . LAPAROSCOPIC ASSISTED VAGINAL HYSTERECTOMY N/A 11/04/2017   Procedure: LAPAROSCOPIC ASSISTED VAGINAL HYSTERECTOMY;  Surgeon: [name], MD;  Location: [facility];  Service: Gynecology;  Laterality: N/A;  . LAPAROSCOPIC BILATERAL SALPINGO  OOPHERECTOMY Bilateral 11/04/2017   Procedure: LAPAROSCOPIC BILATERAL SALPINGO OOPHORECTOMY;  Surgeon: [name], MD;  Location: [facility];  Service: Gynecology;  Laterality: Bilateral;  . LAPAROSCOPY N/A 10/09/2019   Procedure: LAPAROSCOPY DIAGNOSTIC WITH PERITONEAL BIOPSIES;  Surgeon: [name], MD;  Location: [facility];  Service: Gynecology;  Laterality: N/A;  . PORTACATH PLACEMENT Right 06/09/2017   Procedure: INSERTION PORT-A-CATH WITH [place];  Surgeon: Excell Seltzer, MD;  Location: [facility];  Service: General;  Laterality: Right;  . PORTACATH PLACEMENT Right 11/13/2019   Procedure: INSERTION PORT-A-CATH WITH ULTRASOUND GUIDANCE;  Surgeon: Rolm Bookbinder,  MD;  Location: [facility];  Service: General;  Laterality: Right;  . TUBAL LIGATION  02/02/2002   _0    PPTL  . UPPER GI ENDOSCOPY      Family History  Problem Relation Age of Onset  . Hypertension Mother   . Lupus Mother   . Diabetes Father   . Hypertension Sister   . Diabetes Paternal Grandmother   . Colon cancer Paternal Grandmother 82  . CAD Brother   . Diabetes Brother   . Heart attack Brother   . Breast cancer Maternal Aunt        dx >50  . Heart attack Paternal Grandfather   . Breast cancer Maternal Aunt        dx under 59  . Breast cancer Maternal Aunt        dx  under 22    Social History   Socioeconomic History  . Marital status: Legally Separated    Spouse name: Not on file  . Number of children: 2  . Years of education: Not on file  . Highest education level: Not on file  Occupational History  . Not on file  Tobacco Use  . Smoking status: Never Smoker  . Smokeless tobacco: Never Used  Vaping Use  . Vaping Use: Never used  Substance and Sexual Activity  . Alcohol use: No  . Drug use: Never  . Sexual activity: Not on file  Other Topics Concern  . Not on file  Social History Narrative   1 boy and 1 girl   Resides in [place] Determinants of Health   Financial Resource Strain:   . Difficulty of Paying Living Expenses: Not on file  Food Insecurity:   . Worried About Charity fundraiser in the Last Year: Not on file  . Ran Out of Food in the Last Year: Not on file  Transportation Needs:   . Lack of Transportation (Medical): Not on file  . Lack of Transportation (Non-Medical): Not on file  Physical Activity:   . Days of Exercise per Week: Not on file  . Minutes of Exercise per Session: Not on file  Stress:   . Feeling of Stress : Not on file  Social Connections:   . Frequency of Communication with Friends and Family: Not on file  . Frequency of Social Gatherings with Friends and Family: Not on file  .  Attends Religious Services: Not on file  . Active Member of Clubs or Organizations: Not on file  . Attends Archivist Meetings: Not on file  . Marital Status: Not on file  Intimate Partner Violence:   . Fear of Current or Ex-Partner: Not on file  . Emotionally Abused: Not on file  . Physically Abused: Not on file  . Sexually Abused: Not on file    Past Medical History, Surgical  history, Social history, and Family history were reviewed and updated as appropriate.   Please see review of systems for further details on the patient's review from today.   Review of Systems:  Review of Systems  Constitutional: Negative for activity change, chills, diaphoresis and fever.  HENT: Negative for trouble swallowing and voice change.   Respiratory: Negative for cough, chest tightness, shortness of breath and wheezing.   Cardiovascular: Negative for chest pain and palpitations.  Gastrointestinal: Negative for abdominal pain, constipation, diarrhea, nausea and vomiting.  Musculoskeletal: Positive for arthralgias and gait problem. Negative for back pain and myalgias.  Neurological: Negative for dizziness, light-headedness and headaches.    Objective:   Physical Exam:  BP 130/71 (BP Location: Right Arm, Patient Position: Sitting)   Pulse 92   Temp (!) 97.2 F (36.2 C) (Tympanic)   Resp 18   Ht _0  (1.549 m)   Wt 136 lb 3.2 oz (61.8 kg)   LMP 07/09/2017 (LMP Unknown)   SpO2 100% Comment: RA  BMI 25.73 kg/m  ECOG: 1  Physical Exam Constitutional:      General: She is not in acute distress.    Appearance: Normal appearance. She is not ill-appearing.  HENT:     Head: Normocephalic and atraumatic.  Eyes:     General: No scleral icterus.       Right eye: No discharge.        Left eye: No discharge.     Conjunctiva/sclera: Conjunctivae normal.  Musculoskeletal:        General: Tenderness (Left hip tenderness.) present.     Right lower leg: No edema.     Left lower leg: No  edema.  Skin:    General: Skin is warm and dry.  Neurological:     Mental Status: She is alert.     Coordination: Coordination normal.     Gait: Gait normal.     Lab Review:     Component Value Date/Time   NA 138 05/17/2020 0821   NA 137 02/29/2020 1025   NA 139 09/15/2017 0800   K 3.8 05/17/2020 0821   K 3.8 09/15/2017 0800   CL 107 05/17/2020 0821   CO2 20 (L) 05/17/2020 0821   CO2 23 09/15/2017 0800   GLUCOSE 121 (H) 05/17/2020 0821   GLUCOSE 113 09/15/2017 0800   BUN 12 05/17/2020 0821   BUN 10 02/29/2020 1025   BUN 8.7 09/15/2017 0800   CREATININE 0.66 05/17/2020 0821   CREATININE 0.7 09/15/2017 0800   CALCIUM 8.7 (L) 05/17/2020 0821   CALCIUM 9.2 09/15/2017 0800   PROT 7.7 05/17/2020 0821   PROT 7.3 02/29/2020 1025   PROT 7.3 09/15/2017 0800   ALBUMIN 3.5 05/17/2020 0821   ALBUMIN 4.0 02/29/2020 1025   ALBUMIN 3.3 (L) 09/15/2017 0800   AST 15 05/17/2020 0821   AST 13 09/15/2017 0800   ALT 10 05/17/2020 0821   ALT 19 09/15/2017 0800   ALKPHOS 102 05/17/2020 0821   ALKPHOS 74 09/15/2017 0800   BILITOT <0.2 (L) 05/17/2020 0821   BILITOT 0.27 09/15/2017 0800   GFRNONAA >60 05/17/2020 0821   GFRAA >60 05/17/2020 0821       Component Value Date/Time   WBC 5.5 05/17/2020 0821   WBC 3.4 (L) 02/03/2020 0645   RBC 2.71 (L) 05/17/2020 0821   HGB 9.3 (L) 05/17/2020 0821   HGB 9.2 (L) 02/29/2020 1025   HGB 9.3 (L) 09/15/2017 0800   HCT 29.0 (L) 05/17/2020 6295  HCT 27.9 (L) 02/29/2020 1025   HCT 28.2 (L) 09/15/2017 0800   PLT 103 (L) 05/17/2020 0821   PLT 112 (L) 02/29/2020 1025   MCV 107.0 (H) 05/17/2020 0821   MCV 97 02/29/2020 1025   MCV 82.4 09/15/2017 0800   MCH 34.3 (H) 05/17/2020 0821   MCHC 32.1 05/17/2020 0821   RDW 17.7 (H) 05/17/2020 0821   RDW 22.9 (H) 02/29/2020 1025   RDW 20.8 (H) 09/15/2017 0800   LYMPHSABS 1.0 05/17/2020 0821   LYMPHSABS 1.4 02/29/2020 1025   LYMPHSABS 2.5 09/15/2017 0800   MONOABS 0.6 05/17/2020 0821   MONOABS 0.8  09/15/2017 0800   EOSABS 0.0 05/17/2020 0821   EOSABS 0.0 02/29/2020 1025   BASOSABS 0.0 05/17/2020 0821   BASOSABS 0.0 02/29/2020 1025   BASOSABS 0.0 09/15/2017 0800   -------------------------------  Imaging from last 24 hours (if applicable):  Radiology interpretation: DG HIP UNILAT WITH PELVIS 2-3 VIEWS LEFT  Result Date: 05/27/2020 CLINICAL DATA:  Acute left hip pain, history of metastatic breast cancer. EXAM: DG HIP (WITH OR WITHOUT PELVIS) 2-3V LEFT COMPARISON:  March 04, 2020. FINDINGS: There is no evidence of hip fracture or dislocation. There is no evidence of arthropathy. Multiple sclerotic foci are noted throughout the pelvis, particularly in the right iliac wing and the ischial tuberosities and inferior rami bilaterally. Also noted is a solitary focus in the left femoral neck. These are concerning for metastatic disease given the history of metastatic breast cancer. IMPRESSION: Multiple sclerotic foci are noted throughout the pelvis and left femoral neck concerning for metastatic disease. No acute fracture or dislocation is noted. Electronically Signed   By: Marijo Conception M.D.   On: 05/27/2020 15:23        This case was discussed with Dr. [name]. He expresses agreement with my management of this patient.

## 2020-05-31 ENCOUNTER — Inpatient Hospital Stay: Payer: Medicaid Other | Admitting: Family Medicine

## 2020-05-31 DIAGNOSIS — Z510 Encounter for antineoplastic radiation therapy: Secondary | ICD-10-CM | POA: Diagnosis not present

## 2020-05-31 DIAGNOSIS — Z171 Estrogen receptor negative status [ER-]: Secondary | ICD-10-CM | POA: Diagnosis not present

## 2020-05-31 DIAGNOSIS — C50512 Malignant neoplasm of lower-outer quadrant of left female breast: Secondary | ICD-10-CM | POA: Diagnosis not present

## 2020-05-31 DIAGNOSIS — C7951 Secondary malignant neoplasm of bone: Secondary | ICD-10-CM | POA: Diagnosis not present

## 2020-06-03 ENCOUNTER — Ambulatory Visit
Admission: RE | Admit: 2020-06-03 | Discharge: 2020-06-03 | Disposition: A | Discharge status: Home / Self Care | Payer: Medicare Other | Source: Ambulatory Visit | Attending: Radiation Oncology | Admitting: Radiation Oncology

## 2020-06-03 DIAGNOSIS — C7951 Secondary malignant neoplasm of bone: Secondary | ICD-10-CM | POA: Diagnosis not present

## 2020-06-03 DIAGNOSIS — C50512 Malignant neoplasm of lower-outer quadrant of left female breast: Secondary | ICD-10-CM | POA: Diagnosis not present

## 2020-06-03 DIAGNOSIS — Z171 Estrogen receptor negative status [ER-]: Secondary | ICD-10-CM | POA: Diagnosis not present

## 2020-06-03 DIAGNOSIS — Z510 Encounter for antineoplastic radiation therapy: Secondary | ICD-10-CM | POA: Diagnosis not present

## 2020-06-04 ENCOUNTER — Ambulatory Visit
Admission: RE | Admit: 2020-06-04 | Discharge: 2020-06-04 | Disposition: A | Discharge status: Home / Self Care | Payer: Medicare Other | Source: Ambulatory Visit | Attending: Radiation Oncology | Admitting: Radiation Oncology

## 2020-06-04 ENCOUNTER — Other Ambulatory Visit: Payer: Self-pay

## 2020-06-04 DIAGNOSIS — Z510 Encounter for antineoplastic radiation therapy: Secondary | ICD-10-CM | POA: Diagnosis not present

## 2020-06-04 DIAGNOSIS — C50512 Malignant neoplasm of lower-outer quadrant of left female breast: Secondary | ICD-10-CM | POA: Diagnosis not present

## 2020-06-04 DIAGNOSIS — C7951 Secondary malignant neoplasm of bone: Secondary | ICD-10-CM | POA: Diagnosis not present

## 2020-06-04 DIAGNOSIS — Z171 Estrogen receptor negative status [ER-]: Secondary | ICD-10-CM | POA: Diagnosis not present

## 2020-06-05 ENCOUNTER — Other Ambulatory Visit: Payer: Self-pay

## 2020-06-05 ENCOUNTER — Ambulatory Visit
Admission: RE | Admit: 2020-06-05 | Discharge: 2020-06-05 | Disposition: A | Discharge status: Home / Self Care | Payer: Medicare Other | Source: Ambulatory Visit | Attending: Radiation Oncology | Admitting: Radiation Oncology

## 2020-06-05 DIAGNOSIS — C50512 Malignant neoplasm of lower-outer quadrant of left female breast: Secondary | ICD-10-CM | POA: Diagnosis not present

## 2020-06-05 DIAGNOSIS — Z171 Estrogen receptor negative status [ER-]: Secondary | ICD-10-CM | POA: Diagnosis not present

## 2020-06-05 DIAGNOSIS — C7951 Secondary malignant neoplasm of bone: Secondary | ICD-10-CM | POA: Diagnosis not present

## 2020-06-05 DIAGNOSIS — Z510 Encounter for antineoplastic radiation therapy: Secondary | ICD-10-CM | POA: Diagnosis not present

## 2020-06-06 ENCOUNTER — Ambulatory Visit
Admission: RE | Admit: 2020-06-06 | Discharge: 2020-06-06 | Disposition: A | Discharge status: Home / Self Care | Payer: Medicare Other | Source: Ambulatory Visit | Attending: Radiation Oncology | Admitting: Radiation Oncology

## 2020-06-06 ENCOUNTER — Other Ambulatory Visit: Payer: Self-pay

## 2020-06-06 DIAGNOSIS — C7951 Secondary malignant neoplasm of bone: Secondary | ICD-10-CM | POA: Diagnosis not present

## 2020-06-06 DIAGNOSIS — C50512 Malignant neoplasm of lower-outer quadrant of left female breast: Secondary | ICD-10-CM | POA: Diagnosis not present

## 2020-06-06 DIAGNOSIS — Z171 Estrogen receptor negative status [ER-]: Secondary | ICD-10-CM | POA: Diagnosis not present

## 2020-06-06 DIAGNOSIS — Z510 Encounter for antineoplastic radiation therapy: Secondary | ICD-10-CM | POA: Diagnosis not present

## 2020-06-07 ENCOUNTER — Ambulatory Visit
Admission: RE | Admit: 2020-06-07 | Discharge: 2020-06-07 | Disposition: A | Discharge status: Home / Self Care | Payer: Medicare Other | Source: Ambulatory Visit | Attending: Radiation Oncology | Admitting: Radiation Oncology

## 2020-06-07 ENCOUNTER — Other Ambulatory Visit: Payer: Self-pay

## 2020-06-07 DIAGNOSIS — Z510 Encounter for antineoplastic radiation therapy: Secondary | ICD-10-CM | POA: Diagnosis not present

## 2020-06-07 DIAGNOSIS — Z171 Estrogen receptor negative status [ER-]: Secondary | ICD-10-CM | POA: Diagnosis not present

## 2020-06-07 DIAGNOSIS — C7951 Secondary malignant neoplasm of bone: Secondary | ICD-10-CM | POA: Diagnosis not present

## 2020-06-07 DIAGNOSIS — C50512 Malignant neoplasm of lower-outer quadrant of left female breast: Secondary | ICD-10-CM | POA: Diagnosis not present

## 2020-06-10 ENCOUNTER — Other Ambulatory Visit: Payer: Self-pay

## 2020-06-10 ENCOUNTER — Ambulatory Visit
Admission: RE | Admit: 2020-06-10 | Discharge: 2020-06-10 | Disposition: A | Discharge status: Home / Self Care | Payer: Medicare Other | Source: Ambulatory Visit | Attending: Radiation Oncology | Admitting: Radiation Oncology

## 2020-06-10 DIAGNOSIS — Z171 Estrogen receptor negative status [ER-]: Secondary | ICD-10-CM | POA: Diagnosis not present

## 2020-06-10 DIAGNOSIS — Z510 Encounter for antineoplastic radiation therapy: Secondary | ICD-10-CM | POA: Diagnosis not present

## 2020-06-10 DIAGNOSIS — C7951 Secondary malignant neoplasm of bone: Secondary | ICD-10-CM | POA: Diagnosis not present

## 2020-06-10 DIAGNOSIS — C50512 Malignant neoplasm of lower-outer quadrant of left female breast: Secondary | ICD-10-CM | POA: Diagnosis not present

## 2020-06-11 ENCOUNTER — Ambulatory Visit
Admission: RE | Admit: 2020-06-11 | Discharge: 2020-06-11 | Disposition: A | Discharge status: Home / Self Care | Payer: Medicare Other | Source: Ambulatory Visit | Attending: Radiation Oncology | Admitting: Radiation Oncology

## 2020-06-11 DIAGNOSIS — Z510 Encounter for antineoplastic radiation therapy: Secondary | ICD-10-CM | POA: Diagnosis not present

## 2020-06-11 DIAGNOSIS — C50512 Malignant neoplasm of lower-outer quadrant of left female breast: Secondary | ICD-10-CM | POA: Diagnosis not present

## 2020-06-11 DIAGNOSIS — C7951 Secondary malignant neoplasm of bone: Secondary | ICD-10-CM | POA: Diagnosis not present

## 2020-06-11 DIAGNOSIS — Z171 Estrogen receptor negative status [ER-]: Secondary | ICD-10-CM | POA: Diagnosis not present

## 2020-06-12 ENCOUNTER — Ambulatory Visit
Admission: RE | Admit: 2020-06-12 | Discharge: 2020-06-12 | Disposition: A | Discharge status: Home / Self Care | Payer: Medicare Other | Source: Ambulatory Visit | Attending: Radiation Oncology | Admitting: Radiation Oncology

## 2020-06-12 DIAGNOSIS — C50512 Malignant neoplasm of lower-outer quadrant of left female breast: Secondary | ICD-10-CM | POA: Diagnosis not present

## 2020-06-12 DIAGNOSIS — Z171 Estrogen receptor negative status [ER-]: Secondary | ICD-10-CM | POA: Diagnosis not present

## 2020-06-12 DIAGNOSIS — C7951 Secondary malignant neoplasm of bone: Secondary | ICD-10-CM | POA: Diagnosis not present

## 2020-06-12 DIAGNOSIS — Z510 Encounter for antineoplastic radiation therapy: Secondary | ICD-10-CM | POA: Diagnosis not present

## 2020-06-13 ENCOUNTER — Other Ambulatory Visit: Payer: Self-pay

## 2020-06-13 ENCOUNTER — Ambulatory Visit
Admission: RE | Admit: 2020-06-13 | Discharge: 2020-06-13 | Disposition: A | Discharge status: Home / Self Care | Payer: Medicare Other | Source: Ambulatory Visit | Attending: Radiation Oncology | Admitting: Radiation Oncology

## 2020-06-13 DIAGNOSIS — C7951 Secondary malignant neoplasm of bone: Secondary | ICD-10-CM | POA: Diagnosis not present

## 2020-06-13 DIAGNOSIS — Z171 Estrogen receptor negative status [ER-]: Secondary | ICD-10-CM | POA: Diagnosis not present

## 2020-06-13 DIAGNOSIS — C50512 Malignant neoplasm of lower-outer quadrant of left female breast: Secondary | ICD-10-CM | POA: Diagnosis not present

## 2020-06-13 DIAGNOSIS — Z510 Encounter for antineoplastic radiation therapy: Secondary | ICD-10-CM | POA: Diagnosis not present

## 2020-06-14 ENCOUNTER — Telehealth: Payer: Self-pay | Admitting: Hematology and Oncology

## 2020-06-14 ENCOUNTER — Inpatient Hospital Stay: Payer: Medicare Other

## 2020-06-14 ENCOUNTER — Other Ambulatory Visit: Payer: Self-pay

## 2020-06-14 ENCOUNTER — Ambulatory Visit
Admission: RE | Admit: 2020-06-14 | Discharge: 2020-06-14 | Disposition: A | Discharge status: Home / Self Care | Payer: Medicaid Other | Source: Ambulatory Visit | Attending: Radiation Oncology | Admitting: Radiation Oncology

## 2020-06-14 ENCOUNTER — Inpatient Hospital Stay: Payer: Medicare Other | Attending: Gynecologic Oncology | Admitting: Hematology and Oncology

## 2020-06-14 ENCOUNTER — Encounter: Payer: Self-pay | Admitting: Radiation Oncology

## 2020-06-14 VITALS — BP 135/76 | HR 88 | Temp 97.0°F | Resp 18 | Ht 61.0 in | Wt 139.2 lb

## 2020-06-14 DIAGNOSIS — K769 Liver disease, unspecified: Secondary | ICD-10-CM | POA: Insufficient documentation

## 2020-06-14 DIAGNOSIS — D649 Anemia, unspecified: Secondary | ICD-10-CM | POA: Insufficient documentation

## 2020-06-14 DIAGNOSIS — Z95828 Presence of other vascular implants and grafts: Secondary | ICD-10-CM

## 2020-06-14 DIAGNOSIS — C5701 Malignant neoplasm of right fallopian tube: Secondary | ICD-10-CM | POA: Diagnosis present

## 2020-06-14 DIAGNOSIS — C7951 Secondary malignant neoplasm of bone: Secondary | ICD-10-CM | POA: Insufficient documentation

## 2020-06-14 DIAGNOSIS — Z171 Estrogen receptor negative status [ER-]: Secondary | ICD-10-CM

## 2020-06-14 DIAGNOSIS — C50919 Malignant neoplasm of unspecified site of unspecified female breast: Secondary | ICD-10-CM | POA: Diagnosis not present

## 2020-06-14 DIAGNOSIS — E1169 Type 2 diabetes mellitus with other specified complication: Secondary | ICD-10-CM

## 2020-06-14 DIAGNOSIS — Z7984 Long term (current) use of oral hypoglycemic drugs: Secondary | ICD-10-CM | POA: Diagnosis not present

## 2020-06-14 DIAGNOSIS — C50512 Malignant neoplasm of lower-outer quadrant of left female breast: Secondary | ICD-10-CM | POA: Insufficient documentation

## 2020-06-14 DIAGNOSIS — Z510 Encounter for antineoplastic radiation therapy: Secondary | ICD-10-CM | POA: Insufficient documentation

## 2020-06-14 DIAGNOSIS — E785 Hyperlipidemia, unspecified: Secondary | ICD-10-CM | POA: Diagnosis not present

## 2020-06-14 DIAGNOSIS — Z79899 Other long term (current) drug therapy: Secondary | ICD-10-CM | POA: Insufficient documentation

## 2020-06-14 LAB — CBC WITH DIFFERENTIAL (CANCER CENTER ONLY)
Abs Immature Granulocytes: 0.04 10*3/uL (ref 0.00–0.07)
Basophils Absolute: 0 10*3/uL (ref 0.0–0.1)
Basophils Relative: 0 %
Eosinophils Absolute: 0 10*3/uL (ref 0.0–0.5)
Eosinophils Relative: 1 %
HCT: 30.8 % — ABNORMAL LOW (ref 36.0–46.0)
Hemoglobin: 10 g/dL — ABNORMAL LOW (ref 12.0–15.0)
Immature Granulocytes: 1 %
Lymphocytes Relative: 19 %
Lymphs Abs: 1 10*3/uL (ref 0.7–4.0)
MCH: 34.1 pg — ABNORMAL HIGH (ref 26.0–34.0)
MCHC: 32.5 g/dL (ref 30.0–36.0)
MCV: 105.1 fL — ABNORMAL HIGH (ref 80.0–100.0)
Monocytes Absolute: 0.6 10*3/uL (ref 0.1–1.0)
Monocytes Relative: 12 %
Neutro Abs: 3.4 10*3/uL (ref 1.7–7.7)
Neutrophils Relative %: 67 %
Platelet Count: 153 10*3/uL (ref 150–400)
RBC: 2.93 MIL/uL — ABNORMAL LOW (ref 3.87–5.11)
RDW: 15.9 % — ABNORMAL HIGH (ref 11.5–15.5)
WBC Count: 5.1 10*3/uL (ref 4.0–10.5)
nRBC: 0 % (ref 0.0–0.2)

## 2020-06-14 LAB — CMP (CANCER CENTER ONLY)
ALT: 23 U/L (ref 0–44)
AST: 20 U/L (ref 15–41)
Albumin: 3.4 g/dL — ABNORMAL LOW (ref 3.5–5.0)
Alkaline Phosphatase: 89 U/L (ref 38–126)
Anion gap: 8 (ref 5–15)
BUN: 8 mg/dL (ref 6–20)
CO2: 22 mmol/L (ref 22–32)
Calcium: 8.7 mg/dL — ABNORMAL LOW (ref 8.9–10.3)
Chloride: 109 mmol/L (ref 98–111)
Creatinine: 0.66 mg/dL (ref 0.44–1.00)
GFR, Est AFR Am: >60 mL/min (ref >60)
GFR, Estimated: >60 mL/min (ref >60)
Glucose, Bld: 97 mg/dL (ref 70–99)
Potassium: 3.8 mmol/L (ref 3.5–5.1)
Sodium: 139 mmol/L (ref 135–145)
Total Bilirubin: 0.3 mg/dL (ref 0.3–1.2)
Total Protein: 7.3 g/dL (ref 6.5–8.1)

## 2020-06-14 MED ORDER — HEPARIN SOD (PORK) LOCK FLUSH 100 UNIT/ML IV SOLN
500.0000 [IU] | Freq: Once | INTRAVENOUS | Status: AC | PRN
  Administered 2020-06-14: 500 [IU]
  Filled 2020-06-14: qty 5

## 2020-06-14 MED ORDER — METFORMIN HCL 500 MG PO TABS: 1000.0000 mg | ORAL_TABLET | Freq: Two times a day (BID) | ORAL | 1 refills | Status: DC

## 2020-06-14 MED ORDER — ATORVASTATIN CALCIUM 40 MG PO TABS: 40.0000 mg | ORAL_TABLET | Freq: Every day | ORAL | 1 refills | Status: DC

## 2020-06-14 MED ORDER — SODIUM CHLORIDE 0.9% FLUSH
10.0000 mL | INTRAVENOUS | Status: DC | PRN
  Administered 2020-06-14: 10 mL
  Filled 2020-06-14: qty 10

## 2020-06-14 NOTE — Telephone Encounter (Signed)
Scheduled appointments per 10/1 los. Gave patient calendar print out with appointments dates and times.

## 2020-06-14 NOTE — Progress Notes (Signed)
Patient Care Team: Antony Blackbird, MD as PCP - General (Family Medicine) Excell Seltzer, MD (Inactive) as Consulting Physician (General Surgery) [name], MD as Consulting Physician (Hematology and Oncology) Kyung Rudd, MD as Consulting Physician (Radiation Oncology) [name], MD as Consulting Physician (Gynecologic Oncology) [name], NP as Nurse Practitioner (Hematology and Oncology) Mauro Kaufmann, RN as Oncology Nurse Navigator Rockwell Germany, RN as Oncology Nurse Navigator  DIAGNOSIS:    ICD-10-CM   1. Malignant neoplasm of lower-outer quadrant of left breast of female, estrogen receptor negative ([place])  C50.512    Z17.1     SUMMARY OF ONCOLOGIC HISTORY: Oncology History  Malignant neoplasm of lower-outer quadrant of left breast of female, estrogen receptor negative ([place])  05/11/2017 Initial Diagnosis   Left breast biopsy 3:30 position 6 cm from nipple: IDC with DCIS, lymphovascular invasion present, grade 2-3, ER 0%, PR 0%, HER-2 positive ratio 2.28, Ki-67 20%, 1 cm lesion left breast, T1b N0 stage IA clinical stage   06/09/2017 Surgery   Left lumpectomy: IDC grade 3, 1.2 cm, DCIS is present, margins negative, 0/6 lymph nodes negative, ER 0%, PR 0%, HER-2 positive ratio 2.28, Ki-67 20%, T1 BN 0 stage IA   07/09/2017 - 09/29/2017 Adjuvant Chemotherapy   Taxol/Herceptin x 12 completed on 09/29/2017, went on to receive every 3 week adjuvant Herceptin through 06/28/2018   11/25/2017 - 01/07/2018 Radiation Therapy    The patient initially received a dose of 50.4 Gy in 28 fractions to the breast using whole-breast tangent fields. This was delivered using a 3-D conformal technique. The patient then received a boost to the seroma. This delivered an additional 10 Gy in 5 fractions using a 3-D technique. The total dose was 60.4 Gy.   02/15/2018 Genetic Testing   The Common Hereditary Cancer Panel offered by [facility] includes sequencing and/or deletion duplication  testing of the following 47 genes: APC, ATM, AXIN2, BARD1, BMPR1A, BRCA1, BRCA2, BRIP1, CDH1, CDKN2A (p14ARF), CDKN2A (p16INK4a), CKD4, CHEK2, CTNNA1, DICER1, EPCAM (Deletion/duplication testing only), GREM1 (promoter region deletion/duplication testing only), KIT, MEN1, MLH1, MSH2, MSH3, MSH6, MUTYH, NBN, NF1, NHTL1, PALB2, PDGFRA, PMS2, POLD1, POLE, PTEN, RAD50, RAD51C, RAD51D, SDHB, SDHC, SDHD, SMAD4, SMARCA4. STK11, TP53, TSC1, TSC2, and VHL.  The following genes were evaluated for sequence changes only: SDHA and HOXB13 c.251G>A variant only.   Results: POSITIVE. 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 The date of this test report is 02/15/2018   10/20/2019 PET scan   Bone mets involving Right hip, Bil Iliac bones, T8, T9, L2 and L3, Rt prox humerus, Righ glenoid. Some lytic and some sclerotic. Mild Left supraclav adenopathy, 2 small lesions basal ganglia (could be lacunar infarcts)   11/14/2019 - 12/26/2019 Chemotherapy   The patient had palonosetron (ALOXI) injection 0.25 mg, 0.25 mg, Intravenous,  Once, 2 of 5 cycles Administration: 0.25 mg (12/05/2019), 0.25 mg (12/26/2019) ado-trastuzumab emtansine (KADCYLA) 260 mg in sodium chloride 0.9 % 250 mL chemo infusion, 3.6 mg/kg = 260 mg, Intravenous, Once, 3 of 6 cycles Administration: 260 mg (11/14/2019), 260 mg (12/05/2019), 260 mg (12/26/2019)  for chemotherapy treatment.    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  Required TPN briefly.  Patient was able to swallow and keep food down by the time of discharge.   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 Some lytic and some sclerotic. Mild Left supraclav adenopathy, 2 small lesions basal ganglia (could be lacunar infarcts)   11/14/2019 - 12/26/2019 Chemotherapy   The patient had palonosetron (ALOXI) injection 0.25 mg, 0.25 mg, Intravenous,  Once, 2 of 5 cycles Administration: 0.25 mg (12/05/2019), 0.25 mg (12/26/2019) ado-trastuzumab emtansine (KADCYLA) 260 mg in sodium chloride 0.9 % 250 mL chemo infusion, 3.6 mg/kg = 260 mg, Intravenous, Once, 3 of 6 cycles Administration: 260 mg (11/14/2019), 260 mg (12/05/2019), 260 mg (12/26/2019)  for chemotherapy treatment.      CHIEF COMPLIANT: Follow-up of metastatic breast cancer  INTERVAL HISTORY: Erin [name] is a 59 y.o. with above-mentioned history of metastatic breast cancer who is currently on talazoparib and Xgeva. Pelvic XR on 05/27/20 showed multiple sclerotic foci in the pelvis and left femoral neck concerning for metastatic disease. She presents to the clinic today for follow-up.    ALLERGIES:  has No Known Allergies.  MEDICATIONS:  Current Outpatient Medications  Medication Sig Dispense Refill   Accu-Chek FastClix Lancets MISC Use as instructed. Inject into the skin twice daily 100 each 3   atorvastatin (LIPITOR) 40 MG tablet Take 1 tablet (40 mg total) by mouth daily. 90 tablet 1   Blood Glucose Calibration (ACCU-CHEK GUIDE CONTROL) LIQD 1 each by In Vitro route once as needed for up to 1 dose. 1 each 0   cholecalciferol (VITAMIN D) 1000 units tablet Take 1,000 Units by mouth daily.     ferrous sulfate 325 (65 FE) MG tablet TAKE 1 TABLET (325 MG TOTAL) BY MOUTH DAILY BEFORE BREAKFAST. (Patient not taking: Reported on 02/29/2020) 90 tablet 1   glimepiride (AMARYL) 2 MG tablet Take 1 tablet (2 mg  total) by mouth daily with breakfast. To lower blood sugar 90 tablet 3   glucose blood (ACCU-CHEK GUIDE) test strip Use as instructed. Check blood glucose by fingerstick twice per day. 100 each 12   HYDROcodone-acetaminophen (NORCO/VICODIN) 5-325 MG tablet 1/2 to 1 Q 4 hours prn pain 30 tablet 0   Lancet Devices (ACCU-CHEK SOFTCLIX) lancets Use as instructed 1 each 0   lidocaine-prilocaine (EMLA) cream Apply 1 application topically as needed. 30 g 0   metFORMIN (GLUCOPHAGE) 500 MG tablet Take 2 tablets (1,000 mg total) by mouth 2 (two) times daily with a meal. 60 tablet 1   ondansetron (ZOFRAN ODT) 4 MG disintegrating tablet Take 1 tablet (4 mg total) by mouth every 8 (eight) hours as needed for nausea or vomiting. (Patient not taking: Reported on 02/29/2020) 90 tablet 0   pantoprazole (PROTONIX) 40 MG tablet Take 1 tablet (40 mg total) by mouth 2 (two) times daily. 60 tablet 1   talazoparib tosylate (TALZENNA) 0.25 MG capsule Take 3 capsules (0.75 mg total) by mouth daily. 90 capsule 3   No current facility-administered medications for this visit.   Facility-Administered Medications Ordered in Other Visits  Medication Dose Route Frequency Provider Last Rate Last Admin   heparin lock flush 100 unit/mL  500 Units Intracatheter Once PRN [name], MD       sodium  chloride flush (NS) 0.9 % injection 10 mL  10 mL Intracatheter PRN [name], MD        PHYSICAL EXAMINATION: ECOG PERFORMANCE STATUS: 1 - Symptomatic but completely ambulatory  Vitals:   06/14/20 0830  BP: 135/76  Pulse: 88  Resp: 18  Temp: (!) 97 F (36.1 C)  SpO2: 100%   Filed Weights   06/14/20 0830  Weight: 139 lb 3.2 oz (63.1 kg)    LABORATORY DATA:  I have reviewed the data as listed CMP Latest Ref Rng & Units 06/14/2020 05/17/2020 04/29/2020  Glucose 70 - 99 mg/dL 97 121(H) 92  BUN 6 - 20 mg/dL _0 Creatinine 0.44 - 1.00 mg/dL 0.66 0.66 0.65  Sodium 135 - 145 mmol/L 139 138 141  Potassium 3.5  - 5.1 mmol/L 3.8 3.8 4.0  Chloride 98 - 111 mmol/L 109 107 111  CO2 22 - 32 mmol/L 22 20(L) 21(L)  Calcium 8.9 - 10.3 mg/dL 8.7(L) 8.7(L) 9.0  Total Protein 6.5 - 8.1 g/dL 7.3 7.7 7.4  Total Bilirubin 0.3 - 1.2 mg/dL 0.3 <0.2(L) 0.3  Alkaline Phos 38 - 126 U/L 89 102 110  AST 15 - 41 U/L 20 15 14(L)  ALT 0 - 44 U/L _1 Lab Results  Component Value Date   WBC 5.1 06/14/2020   HGB 10.0 (L) 06/14/2020   HCT 30.8 (L) 06/14/2020   MCV 105.1 (H) 06/14/2020   PLT 153 06/14/2020   NEUTROABS 3.4 06/14/2020    ASSESSMENT & PLAN:  Malignant neoplasm of lower-outer quadrant of left breast of female, estrogen receptor negative ([facility]) Left lumpectomy: IDC grade 3, 1.2 cm, DCIS is present, margins negative, 0/6 lymph nodes negative, ER 0%, PR 0%, HER-2 positive ratio 2.28, Ki-67 20%, T1 BN 0 stage IA S/P Taxol-Herceptin BRCA 2 Mutation Positive  10/20/19:Bone mets involving Right hip, Bil Iliac bones, T8, T9, L2 and L3, Rt prox humerus, Righ glenoid. Some lytic and some sclerotic. Mild Left supraclav adenopathy, 2 small lesions basal ganglia (could be lacunar infarcts)  Biopsy the supra clav LN: 10/31/2019: Metastatic breast cancer, ER/PR negative HER-2 equivocal by IHC, FISHpositive Treatmentsummary: Kadcyla every 3-week treatment.Discontinued for progression  MRI of the back 01/07/2020: Extensive metastatic disease to the involved spine. Tumor replacement of L3 vertebral body with pathologic compression fracture, epidural and extraosseous extension. Tumor extending throughout L3 foramen Status post palliative radiation therapyCausing severe dysphagia and dehydration and hospitalization5/17/2021-02/03/2020 ------------------------------------------------------------------------------------------------------------------------------------------------------ Echocardiogram 11/14/2019 Current treatment:Talazoparib started 02/07/2020   Toxicities: Denies any nausea or  vomiting Monitoring closely for cytopenias. Anemia: Hemoglobinimproved to 9.3 Thrombocytopenia:Platelets are106 today. Reduced the dosage of talazoparib to0.75 mg  She finishes radiation today and therefore she will start back on talazoparib tomorrow.  CT chest: April 08, 2020: Treatment related changes in the bone where there was increased sclerosis of the previous lytic change. Subtle nondisplaced pathologic fracture of the right glenoid. Previous liver lesions not well visualized on the current CT scan. Liver lesion slightly smaller than the previous exam. Plan to do another CT scan in 2 months and follow-up after that  Bone metastases: Xgeva every 3 months. Return to clinic in 2 month for labs, scans and follow-up  No orders of the defined types were placed in this encounter.  The patient has a good understanding of the overall plan. she agrees with it. she will call with any problems that may develop before the next visit here.  Total time spent:  mins including face to  face time and time spent for planning, charting and coordination of care  [name], MD 06/14/2020  I, Molly Dorshimer, am acting as scribe for [name].  I have reviewed the above documentation for accuracy and completeness, and I agree with the above.

## 2020-06-14 NOTE — Assessment & Plan Note (Signed)
Left lumpectomy: IDC grade 3, 1.2 cm, DCIS is present, margins negative, 0/6 lymph nodes negative, ER 0%, PR 0%, HER-2 positive ratio 2.28, Ki-67 20%, T1 BN 0 stage IA S/P Taxol-Herceptin BRCA 2 Mutation Positive  10/20/19:Bone mets involving Right hip, Bil Iliac bones, T8, T9, L2 and L3, Rt prox humerus, Righ glenoid. Some lytic and some sclerotic. Mild Left supraclav adenopathy, 2 small lesions basal ganglia (could be lacunar infarcts)  Biopsy the supra clav LN: 10/31/2019: Metastatic breast cancer, ER/PR negative HER-2 equivocal by IHC, FISHpositive Treatmentsummary: Kadcyla every 3-week treatment.Discontinued for progression  MRI of the back 01/07/2020: Extensive metastatic disease to the involved spine. Tumor replacement of L3 vertebral body with pathologic compression fracture, epidural and extraosseous extension. Tumor extending throughout L3 foramen Status post palliative radiation therapyCausing severe dysphagia and dehydration and hospitalization5/17/2021-02/03/2020 ------------------------------------------------------------------------------------------------------------------------------------------------------ Echocardiogram 11/14/2019 Current treatment:Talazoparib started 02/07/2020   Toxicities: Denies any nausea or vomiting Monitoring closely for cytopenias. Anemia: Hemoglobinimproved to 9.3 Thrombocytopenia:Platelets are106 today. Reduced the dosage of talazoparib to0.75 mg    CT chest: April 08, 2020: Treatment related changes in the bone where there was increased sclerosis of the previous lytic change. Subtle nondisplaced pathologic fracture of the right glenoid. Previous liver lesions not well visualized on the current CT scan. Liver lesion slightly smaller than the previous exam. Plan to do another CT scan in November  Bone metastases: Xgeva every 3 months. Return to clinic in 2 month for labs, scans and follow-up

## 2020-07-09 ENCOUNTER — Other Ambulatory Visit: Payer: Self-pay | Admitting: Hematology and Oncology

## 2020-07-15 ENCOUNTER — Other Ambulatory Visit: Payer: Self-pay | Admitting: Hematology and Oncology

## 2020-07-15 ENCOUNTER — Other Ambulatory Visit: Payer: Self-pay | Admitting: *Deleted

## 2020-07-15 ENCOUNTER — Telehealth: Payer: Self-pay | Admitting: Radiation Oncology

## 2020-07-15 DIAGNOSIS — Z171 Estrogen receptor negative status [ER-]: Secondary | ICD-10-CM

## 2020-07-15 DIAGNOSIS — C50512 Malignant neoplasm of lower-outer quadrant of left female breast: Secondary | ICD-10-CM

## 2020-07-15 MED ORDER — TALAZOPARIB TOSYLATE 0.25 MG PO CAPS: 0.7500 mg | ORAL_CAPSULE | Freq: Every day | ORAL | 3 refills | Status: DC

## 2020-07-15 NOTE — Telephone Encounter (Signed)
  Radiation Oncology         [phone] ________________________________  Name: Erin [name] MRN: [id number]  Date of Service: 07/15/2020  DOB: September 07, 1961  Post Treatment Telephone Note  Diagnosis:   Recurrent Metatastatic Stage IA, pT1cN0M0 grade 3, ER/PR negative, HER2 amplified, invasive ductal carcinoma with DCIS of the left breastwith bone metastases.   Interval Since Last Radiation:  4 weeks   06/03/20-06/14/20:  The left femur target was treated to 30 Gy in 10 fractions.   01/01/20-01/19/20: The bilateral iliac bones, right proximal femur, and L2-3 and T8 sites were each treated to 37.5 Gy in 15 fractions.  11/25/2017 - 01/10/2018: The patient initially received a dose of 50.4 Gy in 28 fractions to the breast using whole-breast tangent fields. This was delivered using a 3-D conformal technique. The patient then received a boost to the seroma. This delivered an additional 10 Gy in 5 fractions using a 3-D technique. The total dose was 60.4 Gy.  Narrative:  The patient was contacted today for routine follow-up. During treatment she did very well with radiotherapy and did not have significant desquamation. She reports she is feeling better she reports her pain has improved. She is waiting to get a refill on her Cain Saupe and to have a repeat CT scheduled.  Impression/Plan: 1. Recurrent Metatastatic Stage IA, pT1cN0M0 grade 3, ER/PR negative, HER2 amplified, invasive ductal carcinoma with DCIS of the left breastwith bone metastases.  The patient has been doing well since completion of radiotherapy. We will follow along with her expectantly as she continues under the care of Dr. [name]. I've called Dr. Geralyn Flash nurse as well as she needs a refill of her oncologic medications as well.       Carola Rhine, PAC

## 2020-07-17 MED FILL — TALZENNA 0.25 MG CAPS: 0.25 | 30 days supply | Qty: 90 | Fill #0

## 2020-08-06 ENCOUNTER — Other Ambulatory Visit: Payer: Self-pay

## 2020-08-06 ENCOUNTER — Ambulatory Visit (HOSPITAL_COMMUNITY): Admission: RE | Admit: 2020-08-06 | Payer: Medicaid Other | Source: Ambulatory Visit

## 2020-08-14 ENCOUNTER — Inpatient Hospital Stay: Payer: Medicaid Other

## 2020-08-15 MED FILL — TALZENNA 0.25 MG CAPS: 0.25 | 30 days supply | Qty: 90 | Fill #1

## 2020-08-16 ENCOUNTER — Inpatient Hospital Stay: Payer: Medicaid Other | Admitting: Hematology and Oncology

## 2020-08-16 ENCOUNTER — Telehealth: Payer: Self-pay | Admitting: *Deleted

## 2020-08-16 ENCOUNTER — Inpatient Hospital Stay: Payer: Medicaid Other

## 2020-08-16 NOTE — Telephone Encounter (Signed)
Pt left message at approximately 330 pm today requesting a return call regarding her schedule and new change in insurance.  Due to insurance change she rescheduled her CT to 09/18/2020.  This RN attempted to call pt and obtained VM- message left stating returning her call and request for her to call this office again.

## 2020-08-18 NOTE — Progress Notes (Signed)
  Radiation Oncology         [phone] ________________________________  Name: Erin [name] MRN: [id number]  Date: 06/14/2020  DOB: July 23, 1961  End of Treatment Note  Diagnosis:   bone metastasis     Indication for treatment::  palliative       Radiation treatment dates:   06/03/20 - 06/14/20  Site/dose:   1. The left femur was treated to a dose of 30 Gy in 10 fractions using an isodose plan. This consisted of 3 fields.   Narrative: The patient tolerated radiation treatment relatively well.     Plan: The patient has completed radiation treatment. The patient will return to radiation oncology clinic for routine followup in one month. I advised the patient to call or return sooner if they have any questions or concerns related to their recovery or treatment. ________________________________  Jodelle Gross, M.D., Ph.D.

## 2020-08-19 ENCOUNTER — Inpatient Hospital Stay: Payer: Medicaid Other

## 2020-08-19 ENCOUNTER — Other Ambulatory Visit: Payer: Self-pay | Admitting: Family Medicine

## 2020-08-19 ENCOUNTER — Encounter (HOSPITAL_COMMUNITY): Payer: Self-pay

## 2020-08-19 ENCOUNTER — Ambulatory Visit (HOSPITAL_COMMUNITY): Payer: Self-pay

## 2020-08-19 DIAGNOSIS — E119 Type 2 diabetes mellitus without complications: Secondary | ICD-10-CM

## 2020-08-19 NOTE — Telephone Encounter (Signed)
Requested medication (s) are due for refill today: expired medication  Requested medication (s) are on the active medication list: yes   Last refill:  08/04/2019 #90 3 refills   Future visit scheduled: no   Notes to clinic:  expired medication. Do you want to renew Rx?     Requested Prescriptions  Pending Prescriptions Disp Refills   glimepiride (AMARYL) 2 MG tablet [Pharmacy Med Name: GLIMEPIRIDE 2 MG TABLET] 90 tablet 1    Sig: Take 1 tablet (2 mg total) by mouth daily with breakfast. To lower blood sugar      Endocrinology:  Diabetes - Sulfonylureas Failed - 08/19/2020  4:21 PM      Failed - HBA1C is between 0 and 7.9 and within 180 days    Hgb A1c MFr Bld  Date Value Ref Range Status  01/31/2020 5.9 (H) 4.8 - 5.6 % Final    Comment:    (NOTE)         Prediabetes: 5.7 - 6.4         Diabetes: >6.4         Glycemic control for adults with diabetes: <7.0           Passed - Valid encounter within last 6 months    Recent Outpatient Visits           5 months ago Type 2 diabetes mellitus without complication, without long-term current use of insulin [facility])   [place], [place], [place]   10 months ago Leg cramps   [place], [place], NP   12 months ago Acute left-sided low back pain without sciatica   [place], [place], [place]   1 year ago Low back pain with radiation   [place], [place]   1 year ago Diabetes mellitus type 2, uncontrolled, with complications [facility])   [place], [place], [place]

## 2020-08-26 ENCOUNTER — Inpatient Hospital Stay (HOSPITAL_BASED_OUTPATIENT_CLINIC_OR_DEPARTMENT_OTHER): Payer: Medicaid Other | Admitting: Medical

## 2020-08-26 ENCOUNTER — Inpatient Hospital Stay: Payer: Medicaid Other

## 2020-08-26 ENCOUNTER — Inpatient Hospital Stay: Payer: Medicaid Other | Admitting: Hematology and Oncology

## 2020-08-26 ENCOUNTER — Telehealth: Payer: Self-pay | Admitting: *Deleted

## 2020-08-26 ENCOUNTER — Inpatient Hospital Stay: Payer: Medicaid Other | Attending: Gynecologic Oncology

## 2020-08-26 ENCOUNTER — Other Ambulatory Visit: Payer: Self-pay

## 2020-08-26 ENCOUNTER — Encounter: Payer: Self-pay | Admitting: *Deleted

## 2020-08-26 VITALS — BP 110/54 | HR 92 | Temp 98.6°F | Resp 18

## 2020-08-26 VITALS — BP 114/61 | HR 99 | Temp 97.9°F | Resp 18 | Ht 61.0 in | Wt 141.0 lb

## 2020-08-26 DIAGNOSIS — Z923 Personal history of irradiation: Secondary | ICD-10-CM | POA: Diagnosis not present

## 2020-08-26 DIAGNOSIS — D649 Anemia, unspecified: Secondary | ICD-10-CM | POA: Diagnosis not present

## 2020-08-26 DIAGNOSIS — F32A Depression, unspecified: Secondary | ICD-10-CM | POA: Insufficient documentation

## 2020-08-26 DIAGNOSIS — C50512 Malignant neoplasm of lower-outer quadrant of left female breast: Secondary | ICD-10-CM

## 2020-08-26 DIAGNOSIS — Z171 Estrogen receptor negative status [ER-]: Secondary | ICD-10-CM

## 2020-08-26 DIAGNOSIS — Z8249 Family history of ischemic heart disease and other diseases of the circulatory system: Secondary | ICD-10-CM | POA: Diagnosis not present

## 2020-08-26 DIAGNOSIS — R0602 Shortness of breath: Secondary | ICD-10-CM | POA: Insufficient documentation

## 2020-08-26 DIAGNOSIS — R971 Elevated cancer antigen 125 [CA 125]: Secondary | ICD-10-CM | POA: Insufficient documentation

## 2020-08-26 DIAGNOSIS — Z833 Family history of diabetes mellitus: Secondary | ICD-10-CM | POA: Insufficient documentation

## 2020-08-26 DIAGNOSIS — Z90722 Acquired absence of ovaries, bilateral: Secondary | ICD-10-CM | POA: Insufficient documentation

## 2020-08-26 DIAGNOSIS — Z9079 Acquired absence of other genital organ(s): Secondary | ICD-10-CM | POA: Insufficient documentation

## 2020-08-26 DIAGNOSIS — D6481 Anemia due to antineoplastic chemotherapy: Secondary | ICD-10-CM | POA: Diagnosis not present

## 2020-08-26 DIAGNOSIS — Z95828 Presence of other vascular implants and grafts: Secondary | ICD-10-CM

## 2020-08-26 DIAGNOSIS — Z90710 Acquired absence of both cervix and uterus: Secondary | ICD-10-CM | POA: Diagnosis not present

## 2020-08-26 DIAGNOSIS — Z800 Family history of malignant neoplasm of digestive organs: Secondary | ICD-10-CM | POA: Insufficient documentation

## 2020-08-26 DIAGNOSIS — Z79899 Other long term (current) drug therapy: Secondary | ICD-10-CM | POA: Insufficient documentation

## 2020-08-26 DIAGNOSIS — Z803 Family history of malignant neoplasm of breast: Secondary | ICD-10-CM | POA: Insufficient documentation

## 2020-08-26 DIAGNOSIS — C7951 Secondary malignant neoplasm of bone: Secondary | ICD-10-CM | POA: Diagnosis not present

## 2020-08-26 DIAGNOSIS — C5701 Malignant neoplasm of right fallopian tube: Secondary | ICD-10-CM | POA: Diagnosis present

## 2020-08-26 DIAGNOSIS — T451X5A Adverse effect of antineoplastic and immunosuppressive drugs, initial encounter: Secondary | ICD-10-CM | POA: Diagnosis not present

## 2020-08-26 DIAGNOSIS — Z9221 Personal history of antineoplastic chemotherapy: Secondary | ICD-10-CM | POA: Insufficient documentation

## 2020-08-26 LAB — CMP (CANCER CENTER ONLY)
ALT: 15 U/L (ref 0–44)
AST: 16 U/L (ref 15–41)
Albumin: 3.7 g/dL (ref 3.5–5.0)
Alkaline Phosphatase: 70 U/L (ref 38–126)
Anion gap: 7 (ref 5–15)
BUN: 17 mg/dL (ref 6–20)
CO2: 24 mmol/L (ref 22–32)
Calcium: 9.2 mg/dL (ref 8.9–10.3)
Chloride: 108 mmol/L (ref 98–111)
Creatinine: 0.7 mg/dL (ref 0.44–1.00)
GFR, Estimated: >60 mL/min (ref >60)
Glucose, Bld: 77 mg/dL (ref 70–99)
Potassium: 3.7 mmol/L (ref 3.5–5.1)
Sodium: 139 mmol/L (ref 135–145)
Total Bilirubin: 0.5 mg/dL (ref 0.3–1.2)
Total Protein: 7.4 g/dL (ref 6.5–8.1)

## 2020-08-26 LAB — CBC WITH DIFFERENTIAL (CANCER CENTER ONLY)
Abs Immature Granulocytes: 0.01 10*3/uL (ref 0.00–0.07)
Basophils Absolute: 0 10*3/uL (ref 0.0–0.1)
Basophils Relative: 0 %
Eosinophils Absolute: 0 10*3/uL (ref 0.0–0.5)
Eosinophils Relative: 0 %
HCT: 16.2 % — ABNORMAL LOW (ref 36.0–46.0)
Hemoglobin: 5.5 g/dL — CL (ref 12.0–15.0)
Immature Granulocytes: 0 %
Lymphocytes Relative: 22 %
Lymphs Abs: 0.8 10*3/uL (ref 0.7–4.0)
MCH: 33.5 pg (ref 26.0–34.0)
MCHC: 34 g/dL (ref 30.0–36.0)
MCV: 98.8 fL (ref 80.0–100.0)
Monocytes Absolute: 0.3 10*3/uL (ref 0.1–1.0)
Monocytes Relative: 8 %
Neutro Abs: 2.4 10*3/uL (ref 1.7–7.7)
Neutrophils Relative %: 70 %
Platelet Count: 21 10*3/uL — ABNORMAL LOW (ref 150–400)
RBC: 1.64 MIL/uL — ABNORMAL LOW (ref 3.87–5.11)
RDW: 15 % (ref 11.5–15.5)
WBC Count: 3.4 10*3/uL — ABNORMAL LOW (ref 4.0–10.5)
nRBC: 0 % (ref 0.0–0.2)

## 2020-08-26 LAB — IRON AND TIBC
Iron: 199 ug/dL — ABNORMAL HIGH (ref 41–142)
Saturation Ratios: 90 % — ABNORMAL HIGH (ref 21–57)
TIBC: 223 ug/dL — ABNORMAL LOW (ref 236–444)
UIBC: 23 ug/dL — ABNORMAL LOW (ref 120–384)

## 2020-08-26 LAB — PREPARE RBC (CROSSMATCH)

## 2020-08-26 MED ORDER — ACETAMINOPHEN 325 MG PO TABS
650.0000 mg | ORAL_TABLET | Freq: Once | ORAL | Status: AC
  Administered 2020-08-26: 13:00:00 650 mg via ORAL

## 2020-08-26 MED ORDER — HEPARIN SOD (PORK) LOCK FLUSH 100 UNIT/ML IV SOLN
500.0000 [IU] | Freq: Every day | INTRAVENOUS | Status: AC | PRN
  Administered 2020-08-26: 16:00:00 500 [IU]
  Filled 2020-08-26: qty 5

## 2020-08-26 MED ORDER — SODIUM CHLORIDE 0.9% FLUSH
10.0000 mL | INTRAVENOUS | Status: DC | PRN
  Administered 2020-08-26: 16:00:00 10 mL
  Filled 2020-08-26: qty 10

## 2020-08-26 MED ORDER — ACETAMINOPHEN 325 MG PO TABS
ORAL_TABLET | ORAL | Status: AC
  Filled 2020-08-26: qty 2

## 2020-08-26 MED ORDER — DENOSUMAB 120 MG/1.7ML ~~LOC~~ SOLN
120.0000 mg | Freq: Once | SUBCUTANEOUS | Status: AC
  Administered 2020-08-26: 12:00:00 120 mg via SUBCUTANEOUS

## 2020-08-26 MED ORDER — SODIUM CHLORIDE 0.9 % IV SOLN
Freq: Once | INTRAVENOUS | Status: AC
  Filled 2020-08-26: qty 250

## 2020-08-26 MED ORDER — DENOSUMAB 120 MG/1.7ML ~~LOC~~ SOLN
SUBCUTANEOUS | Status: AC
  Filled 2020-08-26: qty 1.7

## 2020-08-26 MED ORDER — DIPHENHYDRAMINE HCL 25 MG PO CAPS
ORAL_CAPSULE | ORAL | Status: AC
  Filled 2020-08-26: qty 1

## 2020-08-26 MED ORDER — DIPHENHYDRAMINE HCL 25 MG PO CAPS
25.0000 mg | ORAL_CAPSULE | Freq: Once | ORAL | Status: AC
  Administered 2020-08-26: 13:00:00 25 mg via ORAL

## 2020-08-26 NOTE — Telephone Encounter (Signed)
This RN was notified by lab of critical values for heme of 5.5 - noted plts are 21,000.  Dr [name] notified- pt is seeing Sandi Mealy today as well.

## 2020-08-26 NOTE — Progress Notes (Signed)
Pt to receive 1 unit PRBCs today and 1 unit tomorrow d/t delays from Type/screen and antibodies.  Pt aware of return appt on 12/14.  Blood bank made aware.  Repeatedly contacted blood bank who was unable to give time estimate of when blood antibody screening would be finished at 4pm.  Pt would prefer to come back tomorrow for 1 unit and figure out second unit later in the week.  Pt aware to return at 1:30 tomorrow (for 2:15 appt, wasn't able to make appt earlier d/t scheduling block issues) for 1 unit PRBCs and to wear her blue blood bracelet.  Blood bank made aware.  PA Lucianne Lei aware.  Pt A&Ox4, ambulatory w/steady gait, stable at d/c.  Pt verbalized understanding of all information through ASL interpreter Juliann Pulse.

## 2020-08-26 NOTE — Patient Instructions (Signed)

## 2020-08-26 NOTE — Progress Notes (Signed)
Cleared w/ Dr. [name] - ok to administer Xgeva today w/ anemia/thrombocytopenia.  Kennith Center, Pharm.D., CPP 08/26/2020@12 :13 PM

## 2020-08-26 NOTE — Patient Instructions (Signed)

## 2020-08-26 NOTE — Patient Instructions (Signed)
Denosumab injection What is this medicine? DENOSUMAB (den oh sue mab) slows bone breakdown. Prolia is used to treat osteoporosis in women after menopause and in men, and in people who are taking corticosteroids for 6 months or more. Xgeva is used to treat a high calcium level due to cancer and to prevent bone fractures and other bone problems caused by multiple myeloma or cancer bone metastases. Xgeva is also used to treat giant cell tumor of the bone. This medicine may be used for other purposes; ask your health care provider or pharmacist if you have questions. COMMON BRAND NAME(S): Prolia, XGEVA What should I tell my health care provider before I take this medicine? They need to know if you have any of these conditions:  dental disease  having surgery or tooth extraction  infection  kidney disease  low levels of calcium or Vitamin D in the blood  malnutrition  on hemodialysis  skin conditions or sensitivity  thyroid or parathyroid disease  an unusual reaction to denosumab, other medicines, foods, dyes, or preservatives  pregnant or trying to get pregnant  breast-feeding How should I use this medicine? This medicine is for injection under the skin. It is given by a health care professional in a hospital or clinic setting. A special MedGuide will be given to you before each treatment. Be sure to read this information carefully each time. For Prolia, talk to your pediatrician regarding the use of this medicine in children. Special care may be needed. For Xgeva, talk to your pediatrician regarding the use of this medicine in children. While this drug may be prescribed for children as young as 13 years for selected conditions, precautions do apply. Overdosage: If you think you have taken too much of this medicine contact a poison control center or emergency room at once. NOTE: This medicine is only for you. Do not share this medicine with others. What if I miss a dose? It is  important not to miss your dose. Call your doctor or health care professional if you are unable to keep an appointment. What may interact with this medicine? Do not take this medicine with any of the following medications:  other medicines containing denosumab This medicine may also interact with the following medications:  medicines that lower your chance of fighting infection  steroid medicines like prednisone or cortisone This list may not describe all possible interactions. Give your health care provider a list of all the medicines, herbs, non-prescription drugs, or dietary supplements you use. Also tell them if you smoke, drink alcohol, or use illegal drugs. Some items may interact with your medicine. What should I watch for while using this medicine? Visit your doctor or health care professional for regular checks on your progress. Your doctor or health care professional may order blood tests and other tests to see how you are doing. Call your doctor or health care professional for advice if you get a fever, chills or sore throat, or other symptoms of a cold or flu. Do not treat yourself. This drug may decrease your body's ability to fight infection. Try to avoid being around people who are sick. You should make sure you get enough calcium and vitamin D while you are taking this medicine, unless your doctor tells you not to. Discuss the foods you eat and the vitamins you take with your health care professional. See your dentist regularly. Brush and floss your teeth as directed. Before you have any dental work done, tell your dentist you are   receiving this medicine. Do not become pregnant while taking this medicine or for 5 months after stopping it. Talk with your doctor or health care professional about your birth control options while taking this medicine. Women should inform their doctor if they wish to become pregnant or think they might be pregnant. There is a potential for serious side  effects to an unborn child. Talk to your health care professional or pharmacist for more information. What side effects may I notice from receiving this medicine? Side effects that you should report to your doctor or health care professional as soon as possible:  allergic reactions like skin rash, itching or hives, swelling of the face, lips, or tongue  bone pain  breathing problems  dizziness  jaw pain, especially after dental work  redness, blistering, peeling of the skin  signs and symptoms of infection like fever or chills; cough; sore throat; pain or trouble passing urine  signs of low calcium like fast heartbeat, muscle cramps or muscle pain; pain, tingling, numbness in the hands or feet; seizures  unusual bleeding or bruising  unusually weak or tired Side effects that usually do not require medical attention (report to your doctor or health care professional if they continue or are bothersome):  constipation  diarrhea  headache  joint pain  loss of appetite  muscle pain  runny nose  tiredness  upset stomach This list may not describe all possible side effects. Call your doctor for medical advice about side effects. You may report side effects to FDA at 1-800-FDA-1088. Where should I keep my medicine? This medicine is only given in a clinic, doctor's office, or other health care setting and will not be stored at home. NOTE: This sheet is a summary. It may not cover all possible information. If you have questions about this medicine, talk to your doctor, pharmacist, or health care provider.  2020 Elsevier/Gold Standard (2018-01-07 16:10:44)

## 2020-08-26 NOTE — Progress Notes (Signed)
Pt arrives for a port flush. Pt reports shortness of breath with mild exertion for 2 days. She also states that she has some tightness in her back which is relieved w/ biofreeze. See VS in flowsheets. Dr. Geralyn Flash nurse notified. V Tanner to see her in [facility] today.

## 2020-08-27 ENCOUNTER — Other Ambulatory Visit: Payer: Self-pay

## 2020-08-27 ENCOUNTER — Other Ambulatory Visit: Payer: Self-pay | Admitting: Hematology and Oncology

## 2020-08-27 ENCOUNTER — Inpatient Hospital Stay: Payer: Medicaid Other

## 2020-08-27 VITALS — BP 110/64 | HR 86 | Temp 98.0°F | Resp 16 | Ht 61.0 in | Wt 142.3 lb

## 2020-08-27 DIAGNOSIS — Z95828 Presence of other vascular implants and grafts: Secondary | ICD-10-CM

## 2020-08-27 DIAGNOSIS — D649 Anemia, unspecified: Secondary | ICD-10-CM

## 2020-08-27 DIAGNOSIS — C50512 Malignant neoplasm of lower-outer quadrant of left female breast: Secondary | ICD-10-CM | POA: Diagnosis not present

## 2020-08-27 LAB — PREPARE RBC (CROSSMATCH)

## 2020-08-27 MED ORDER — SODIUM CHLORIDE 0.9% FLUSH
10.0000 mL | INTRAVENOUS | Status: DC | PRN
  Administered 2020-08-27: 10 mL
  Filled 2020-08-27: qty 10

## 2020-08-27 MED ORDER — SODIUM CHLORIDE 0.9% IV SOLUTION
250.0000 mL | Freq: Once | INTRAVENOUS | Status: AC
  Administered 2020-08-27: 14:00:00 250 mL via INTRAVENOUS
  Filled 2020-08-27: qty 250

## 2020-08-27 MED ORDER — DIPHENHYDRAMINE HCL 25 MG PO CAPS
25.0000 mg | ORAL_CAPSULE | Freq: Once | ORAL | Status: AC
  Administered 2020-08-27: 14:00:00 25 mg via ORAL

## 2020-08-27 MED ORDER — ACETAMINOPHEN 325 MG PO TABS
650.0000 mg | ORAL_TABLET | Freq: Once | ORAL | Status: AC
  Administered 2020-08-27: 14:00:00 650 mg via ORAL

## 2020-08-27 MED ORDER — HEPARIN SOD (PORK) LOCK FLUSH 100 UNIT/ML IV SOLN
500.0000 [IU] | Freq: Once | INTRAVENOUS | Status: AC | PRN
  Administered 2020-08-27: 500 [IU]
  Filled 2020-08-27: qty 5

## 2020-08-27 MED ORDER — DIPHENHYDRAMINE HCL 25 MG PO CAPS
ORAL_CAPSULE | ORAL | Status: AC
  Filled 2020-08-27: qty 1

## 2020-08-27 MED ORDER — ACETAMINOPHEN 325 MG PO TABS
ORAL_TABLET | ORAL | Status: AC
  Filled 2020-08-27: qty 2

## 2020-08-27 NOTE — Progress Notes (Signed)
Patient scheduled for 2 units of PRBC's for 12/15 @ 230pm - Pt aware.  Orders placed via Epic.

## 2020-08-27 NOTE — Patient Instructions (Signed)

## 2020-08-28 ENCOUNTER — Inpatient Hospital Stay: Payer: Medicaid Other

## 2020-08-28 ENCOUNTER — Other Ambulatory Visit: Payer: Self-pay | Admitting: Medical

## 2020-08-28 ENCOUNTER — Other Ambulatory Visit: Payer: Self-pay

## 2020-08-28 DIAGNOSIS — T451X5A Adverse effect of antineoplastic and immunosuppressive drugs, initial encounter: Secondary | ICD-10-CM

## 2020-08-28 DIAGNOSIS — D6481 Anemia due to antineoplastic chemotherapy: Secondary | ICD-10-CM

## 2020-08-28 DIAGNOSIS — C50512 Malignant neoplasm of lower-outer quadrant of left female breast: Secondary | ICD-10-CM | POA: Diagnosis not present

## 2020-08-28 DIAGNOSIS — D649 Anemia, unspecified: Secondary | ICD-10-CM

## 2020-08-28 MED ORDER — DIPHENHYDRAMINE HCL 25 MG PO CAPS
ORAL_CAPSULE | ORAL | Status: AC
  Filled 2020-08-28: qty 1

## 2020-08-28 MED ORDER — SODIUM CHLORIDE 0.9% IV SOLUTION
250.0000 mL | Freq: Once | INTRAVENOUS | Status: AC
  Administered 2020-08-28: 15:00:00 250 mL via INTRAVENOUS
  Filled 2020-08-28: qty 250

## 2020-08-28 MED ORDER — ACETAMINOPHEN 325 MG PO TABS
650.0000 mg | ORAL_TABLET | Freq: Once | ORAL | Status: AC
  Administered 2020-08-28: 15:00:00 650 mg via ORAL

## 2020-08-28 MED ORDER — ACETAMINOPHEN 325 MG PO TABS
ORAL_TABLET | ORAL | Status: AC
  Filled 2020-08-28: qty 2

## 2020-08-28 MED ORDER — SODIUM CHLORIDE 0.9% FLUSH
10.0000 mL | INTRAVENOUS | Status: AC | PRN
  Administered 2020-08-28: 17:00:00 10 mL
  Filled 2020-08-28: qty 10

## 2020-08-28 MED ORDER — HEPARIN SOD (PORK) LOCK FLUSH 100 UNIT/ML IV SOLN
500.0000 [IU] | Freq: Every day | INTRAVENOUS | Status: AC | PRN
  Administered 2020-08-28: 17:00:00 500 [IU]
  Filled 2020-08-28: qty 5

## 2020-08-28 MED ORDER — DIPHENHYDRAMINE HCL 25 MG PO CAPS
25.0000 mg | ORAL_CAPSULE | Freq: Once | ORAL | Status: AC
  Administered 2020-08-28: 15:00:00 25 mg via ORAL

## 2020-08-28 NOTE — Progress Notes (Signed)
[facility] Progress Note   Erin [name] [id number] 09-18-1960 59 y.o.  Erin [name] is managed by Dr. Nicholas [name]  Actively treated with chemotherapy/immunotherapy/hormonal therapy: yes  Current therapy: Erin [name] and Xgeva  Next scheduled appointment with provider: 09/20/2020  Assessment: Plan:    Symptomatic anemia - Plan: Iron and TIBC, Informed Consent Details: Physician/Practitioner Attestation; Transcribe to consent form and obtain patient signature, Type and screen, Care order/instruction, heparin lock flush 100 unit/mL, Prepare RBC (crossmatch), Transfuse RBC, acetaminophen (TYLENOL) tablet 650 mg, diphenhydrAMINE (BENADRYL) capsule 25 mg, Type and screen, Prepare RBC (crossmatch), Care order/instruction, Informed Consent Details: Physician/Practitioner Attestation; Transcribe to consent form and obtain patient signature, 0.9 %  sodium chloride infusion  Port-A-Cath in place - Plan: sodium chloride flush (NS) 0.9 % injection 10 mL  Malignant neoplasm of lower-outer quadrant of left breast of female, estrogen receptor negative ([place])  Bone metastases ([place])   Anemia secondary to chemotherapy: A CBC returned today with a hemoglobin of 5.5.  Plans are to proceed with a transfusion of 2 units of packed red blood cells.  I have asked that the patient be scheduled for follow-up on 08/30/2020 for a repeat CBC with plans for additional transfusions as indicated.  This case was discussed with Dr. Nicholas [name] who instructs the patient to hold Erin [name] for now.  She is scheduled to be seen in follow-up by him on 09/20/2020.  She may need to return next week for repeat labs and possible transfusions as indicated.  Iron studies were collected today.  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 Nicholas [name] and is treated with Erin [name], Erin [name] and Erin [name].  She is scheduled to be seen next  on 09/20/2020 and will hold Erin [name] until her return.  Please [name] After Visit Summary for patient specific instructions.  Future Appointments  Date Time Provider [place]  08/28/2020  2:30 PM [facility] INFUSION [facility] None  09/18/2020  9:00 AM [facility] FLUSH [facility] None  09/18/2020  9:15 AM [facility] LAB [facility] None  09/18/2020 10:00 AM [facility] 2 [facility] [place]  09/20/2020  8:30 AM Erin Lose, MD [facility] None    Orders Placed This Encounter  Procedures  . Iron and TIBC  . Informed Consent Details: Physician/Practitioner Attestation; Transcribe to consent form and obtain patient signature  . Care order/instruction  . Type and screen  . Prepare RBC (crossmatch)       Subjective:   Patient ID:  Erin [name] is a 59 y.o. (DOB 04-29-1961) female.  Chief Complaint:  Chief Complaint  Patient presents with  . Shortness of Breath    HPI Erin [name]  is a 59 y.o. 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  She is managed by Dr. Nicholas [name] and is treated with Erin [name], Erin [name] and Erin [name].  She presents to the clinic today with new onset of chest tightness, weakness, and significant shortness of breath.  She denies bright red blood per rectum, hematuria, hemoptysis, or other signs of acute bleeding.   Medications: I have reviewed the patient's current medications.  Allergies: No Known Allergies  Past Medical History:  Diagnosis Date  . Arthritis    knees, elbows  . Borderline glaucoma of right eye   . BRCA2 gene mutation positive 02/17/2018   BRCA2 c.4552del (p.[id number]*25)  Result reported out on 02/15/2018.   Marland Kitchen  Breast cancer ([place]) 05/2017   left  . Chronic back pain   . Deaf    per pt born hearing and at age 63 1/2 lost hearing , was told by mother unknown cause, can miminally hear in left and no hearing on right   . Depression   . Dyspnea    states some SOB with ADLs  and anemia   . Elevated cancer antigen 125 (CA 125)   . Family history of breast cancer   . Family history of breast cancer   . Family history of colon cancer   . Genetic testing 02/17/2018   The Common Hereditary Cancer Panel offered by [facility] includes sequencing and/or deletion duplication testing of the following 47 genes: APC, ATM, AXIN2, BARD1, BMPR1A, BRCA1, BRCA2, BRIP1, CDH1, CDKN2A (p14ARF), CDKN2A (p16INK4a), CKD4, CHEK2, CTNNA1, DICER1, EPCAM (Deletion/duplication testing only), GREM1 (promoter region deletion/duplication testing only), KIT, MEN1, MLH1, MSH2, MSH3, MSH6, MU  . History of cancer chemotherapy    left breast cancer 09-08-2017 to 10-09-2017;  fallopion tube cancer  12-15-2017  to 06-28-2018  . 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 History of external beam radiation therapy    left breast  11-25-2017  to 01-07-2018  . Hyperlipidemia   . Hypertension    followed by pcp   (10-05-2019  per pt never had stress test)  . IDA (iron deficiency anemia)   . Malignant neoplasm of lower-outer quadrant of left breast of female, estrogen receptor negative [facility]) oncologist-- dr [name]   dx 08/ 2018--- Stage IA, DCIS,  ER/ PR negative,  HER-2 positive;  06-09-2017 s/p left breast lumpectomy with node dissection;  completed chemo 10-09-2017  and radiation 01-07-2018/  hercepton completed 06-28-2018  . Non-insulin dependent type 2 diabetes mellitus ([place])    followed by pcp   (10-05-2019 per pt check cbg every other day in AM,  fasting cbg-- 105)  . Numbness of right thumb   . Wears glasses     Past Surgical History:  Procedure Laterality Date  . BREAST LUMPECTOMY WITH RADIOACTIVE SEED AND SENTINEL LYMPH NODE BIOPSY Left 06/09/2017   Procedure: BREAST LUMPECTOMY WITH RADIOACTIVE SEED AND SENTINEL LYMPH NODE  BIOPSY;  Surgeon: Excell Seltzer, MD;  Location: [place];  Service: General;  Laterality: Left;  . COLONOSCOPY  11/02/2017   polyps  . HERNIA REPAIR    . HIATAL HERNIA REPAIR  09/2019  . LAPAROSCOPIC ASSISTED VAGINAL HYSTERECTOMY N/A 11/04/2017   Procedure: LAPAROSCOPIC ASSISTED VAGINAL HYSTERECTOMY;  Surgeon: [name], MD;  Location: [facility];  Service: Gynecology;  Laterality: N/A;  . LAPAROSCOPIC BILATERAL SALPINGO OOPHERECTOMY Bilateral 11/04/2017   Procedure: LAPAROSCOPIC BILATERAL SALPINGO OOPHORECTOMY;  Surgeon: [name], MD;  Location: [facility];  Service: Gynecology;  Laterality: Bilateral;  . LAPAROSCOPY N/A 10/09/2019   Procedure: LAPAROSCOPY DIAGNOSTIC WITH PERITONEAL BIOPSIES;  Surgeon: [name], MD;  Location: [facility];  Service: Gynecology;  Laterality: N/A;  . PORTACATH PLACEMENT Right 06/09/2017   Procedure: INSERTION PORT-A-CATH WITH [place];  Surgeon: Excell Seltzer, MD;  Location: [place];  Service: General;  Laterality: Right;  . PORTACATH PLACEMENT Right 11/13/2019   Procedure: INSERTION PORT-A-CATH WITH ULTRASOUND GUIDANCE;  Surgeon: Rolm Bookbinder, MD;  Location: [place];  Service: General;  Laterality: Right;  .  TUBAL LIGATION  02/02/2002   '@[facility]'$    PPTL  . UPPER GI ENDOSCOPY      Family History  Problem Relation Age of Onset  . Hypertension Mother   . Lupus Mother   . Diabetes Father   . Hypertension Sister   . Diabetes Paternal Grandmother   . Colon cancer Paternal Grandmother 90  . CAD Brother   . Diabetes Brother   . Heart attack Brother   . Breast cancer Maternal Aunt        dx >50  . Heart attack Paternal Grandfather   . Breast cancer Maternal Aunt        dx under 79  . Breast cancer Maternal Aunt        dx  under 71    Social History   Socioeconomic History  . Marital status: Legally Separated    Spouse name: Not on file  . Number of children: 2  . Years of  education: Not on file  . Highest education level: Not on file  Occupational History  . Not on file  Tobacco Use  . Smoking status: Never Smoker  . Smokeless tobacco: Never Used  Vaping Use  . Vaping Use: Never used  Substance and Sexual Activity  . Alcohol use: No  . Drug use: Never  . Sexual activity: Not on file  Other Topics Concern  . Not on file  Social History Narrative   1 boy and 1 girl   Resides in [place]   Seperated   Social Determinants of Health   Financial Resource Strain: Not on file  Food Insecurity: Not on file  Transportation Needs: Not on file  Physical Activity: Not on file  Stress: Not on file  Social Connections: Not on file  Intimate Partner Violence: Not on file    Past Medical History, Surgical history, Social history, and Family history were reviewed and updated as appropriate.   Please [name] review of systems for further details on the patient's review from today.   Review of Systems:  Review of Systems  Constitutional: Negative for chills, diaphoresis and fever.  HENT: Negative for trouble swallowing.   Respiratory: Positive for shortness of breath. Negative for cough, choking, chest tightness, wheezing and stridor.   Cardiovascular: Negative for chest pain and palpitations.  Gastrointestinal: Negative for blood in stool, constipation, diarrhea and nausea.  Genitourinary: Negative for hematuria.  Skin: Negative for rash and wound.    Objective:   Physical Exam:  BP 114/61 (BP Location: Right Arm, Patient Position: Sitting)   Pulse 99   Temp 97.9 F (36.6 C)   Resp 18   Ht $R'5\' 1"'tN$  (1.549 m)   Wt 141 lb (64 kg)   LMP 07/09/2017 (LMP Unknown)   SpO2 100%   BMI 26.64 kg/m  ECOG: 1  Physical Exam Constitutional:      General: She is not in acute distress.    Appearance: She is not diaphoretic.  HENT:     Head: Normocephalic and atraumatic.  Cardiovascular:     Rate and Rhythm: Normal rate and regular rhythm.     Heart  sounds: Normal heart sounds. No murmur heard. No friction rub. No gallop.   Pulmonary:     Effort: Pulmonary effort is normal. No respiratory distress.     Breath sounds: Normal breath sounds. No stridor. No wheezing or rales.  Skin:    General: Skin is warm and dry.     Coloration: Skin is not pale.  Findings: No erythema.  Neurological:     Mental Status: She is alert.  Psychiatric:        Mood and Affect: Mood and affect normal.        Behavior: Behavior normal.        Thought Content: Thought content normal.        Judgment: Judgment normal.     Lab Review:     Component Value Date/Time   NA 139 08/26/2020 1055   NA 137 02/29/2020 1025   NA 139 09/15/2017 0800   K 3.7 08/26/2020 1055   K 3.8 09/15/2017 0800   CL 108 08/26/2020 1055   CO2 24 08/26/2020 1055   CO2 23 09/15/2017 0800   GLUCOSE 77 08/26/2020 1055   GLUCOSE 113 09/15/2017 0800   BUN 17 08/26/2020 1055   BUN 10 02/29/2020 1025   BUN 8.7 09/15/2017 0800   CREATININE 0.70 08/26/2020 1055   CREATININE 0.7 09/15/2017 0800   CALCIUM 9.2 08/26/2020 1055   CALCIUM 9.2 09/15/2017 0800   PROT 7.4 08/26/2020 1055   PROT 7.3 02/29/2020 1025   PROT 7.3 09/15/2017 0800   ALBUMIN 3.7 08/26/2020 1055   ALBUMIN 4.0 02/29/2020 1025   ALBUMIN 3.3 (L) 09/15/2017 0800   AST 16 08/26/2020 1055   AST 13 09/15/2017 0800   ALT 15 08/26/2020 1055   ALT 19 09/15/2017 0800   ALKPHOS 70 08/26/2020 1055   ALKPHOS 74 09/15/2017 0800   BILITOT 0.5 08/26/2020 1055   BILITOT 0.27 09/15/2017 0800   GFRNONAA >60 08/26/2020 1055   GFRAA >60 06/14/2020 0821       Component Value Date/Time   WBC 3.4 (L) 08/26/2020 1055   WBC 3.4 (L) 02/03/2020 0645   RBC 1.64 (L) 08/26/2020 1055   HGB 5.5 (LL) 08/26/2020 1055   HGB 9.2 (L) 02/29/2020 1025   HGB 9.3 (L) 09/15/2017 0800   HCT 16.2 (L) 08/26/2020 1055   HCT 27.9 (L) 02/29/2020 1025   HCT 28.2 (L) 09/15/2017 0800   PLT 21 (L) 08/26/2020 1055   PLT 112 (L) 02/29/2020 1025    MCV 98.8 08/26/2020 1055   MCV 97 02/29/2020 1025   MCV 82.4 09/15/2017 0800   MCH 33.5 08/26/2020 1055   MCHC 34.0 08/26/2020 1055   RDW 15.0 08/26/2020 1055   RDW 22.9 (H) 02/29/2020 1025   RDW 20.8 (H) 09/15/2017 0800   LYMPHSABS 0.8 08/26/2020 1055   LYMPHSABS 1.4 02/29/2020 1025   LYMPHSABS 2.5 09/15/2017 0800   MONOABS 0.3 08/26/2020 1055   MONOABS 0.8 09/15/2017 0800   EOSABS 0.0 08/26/2020 1055   EOSABS 0.0 02/29/2020 1025   BASOSABS 0.0 08/26/2020 1055   BASOSABS 0.0 02/29/2020 1025   BASOSABS 0.0 09/15/2017 0800   -------------------------------  Imaging from last 24 hours (if applicable):  Radiology interpretation: No results found.      This case was discussed with Dr. [name]. He expresses agreement with my management of this patient.

## 2020-08-28 NOTE — Patient Instructions (Signed)

## 2020-08-29 ENCOUNTER — Other Ambulatory Visit: Payer: Self-pay | Admitting: Hematology and Oncology

## 2020-08-29 LAB — TYPE AND SCREEN
ABO/RH(D): A POS
Antibody Screen: POSITIVE
Donor AG Type: NEGATIVE
Donor AG Type: NEGATIVE
Unit division: 0
Unit division: 0

## 2020-08-29 LAB — BPAM RBC
Blood Product Expiration Date: 202201202359
Blood Product Expiration Date: 202201212359
ISSUE DATE / TIME: 202112141440
ISSUE DATE / TIME: 202112151457
Unit Type and Rh: 5100
Unit Type and Rh: 5100

## 2020-08-30 ENCOUNTER — Inpatient Hospital Stay: Payer: Medicaid Other

## 2020-08-30 ENCOUNTER — Inpatient Hospital Stay (HOSPITAL_BASED_OUTPATIENT_CLINIC_OR_DEPARTMENT_OTHER): Payer: Medicaid Other | Admitting: Medical

## 2020-08-30 ENCOUNTER — Other Ambulatory Visit: Payer: Self-pay

## 2020-08-30 VITALS — BP 124/69 | HR 83 | Temp 97.8°F | Resp 16 | Wt 144.4 lb

## 2020-08-30 DIAGNOSIS — D6481 Anemia due to antineoplastic chemotherapy: Secondary | ICD-10-CM

## 2020-08-30 DIAGNOSIS — D61818 Other pancytopenia: Secondary | ICD-10-CM

## 2020-08-30 DIAGNOSIS — C50512 Malignant neoplasm of lower-outer quadrant of left female breast: Secondary | ICD-10-CM | POA: Diagnosis not present

## 2020-08-30 LAB — CBC WITH DIFFERENTIAL (CANCER CENTER ONLY)
Abs Immature Granulocytes: 0.01 10*3/uL (ref 0.00–0.07)
Basophils Absolute: 0 10*3/uL (ref 0.0–0.1)
Basophils Relative: 0 %
Eosinophils Absolute: 0.1 10*3/uL (ref 0.0–0.5)
Eosinophils Relative: 2 %
HCT: 28.2 % — ABNORMAL LOW (ref 36.0–46.0)
Hemoglobin: 9.8 g/dL — ABNORMAL LOW (ref 12.0–15.0)
Immature Granulocytes: 0 %
Lymphocytes Relative: 21 %
Lymphs Abs: 0.7 10*3/uL (ref 0.7–4.0)
MCH: 32 pg (ref 26.0–34.0)
MCHC: 34.8 g/dL (ref 30.0–36.0)
MCV: 92.2 fL (ref 80.0–100.0)
Monocytes Absolute: 0.2 10*3/uL (ref 0.1–1.0)
Monocytes Relative: 6 %
Neutro Abs: 2.5 10*3/uL (ref 1.7–7.7)
Neutrophils Relative %: 71 %
Platelet Count: 19 10*3/uL — ABNORMAL LOW (ref 150–400)
RBC: 3.06 MIL/uL — ABNORMAL LOW (ref 3.87–5.11)
RDW: 14.6 % (ref 11.5–15.5)
WBC Count: 3.5 10*3/uL — ABNORMAL LOW (ref 4.0–10.5)
nRBC: 0 % (ref 0.0–0.2)

## 2020-08-30 LAB — SAMPLE TO BLOOD BANK

## 2020-08-30 NOTE — Progress Notes (Signed)
These results were reviewed with the patient.

## 2020-08-30 NOTE — Progress Notes (Signed)
Erin [name] presents to the clinic today in follow-up of a recent transfusion of 2 units of packed red blood cells due to pancytopenia secondary to dosing with Talzenna.  She continues to hold [place] since her visit of 08/26/2020.  A CBC returned today with a WBC of 3.5, hemoglobin 9.8, hematocrit 28.2 and a platelet count of 19.  She will return for follow-up and for labs on 09/03/2020.  She was seen today with a sign language interpreter.  Sandi Mealy, MHS, PA-C Physician Assistant

## 2020-08-30 NOTE — Progress Notes (Signed)
Patient had labs drawn peripherally.  

## 2020-09-03 ENCOUNTER — Other Ambulatory Visit: Payer: Self-pay

## 2020-09-03 ENCOUNTER — Inpatient Hospital Stay (HOSPITAL_BASED_OUTPATIENT_CLINIC_OR_DEPARTMENT_OTHER): Payer: Medicaid Other | Admitting: Medical

## 2020-09-03 ENCOUNTER — Inpatient Hospital Stay: Payer: Medicaid Other

## 2020-09-03 VITALS — BP 123/69 | HR 87 | Temp 98.9°F | Resp 16 | Ht 61.0 in | Wt 141.9 lb

## 2020-09-03 DIAGNOSIS — C50512 Malignant neoplasm of lower-outer quadrant of left female breast: Secondary | ICD-10-CM | POA: Diagnosis not present

## 2020-09-03 DIAGNOSIS — D61818 Other pancytopenia: Secondary | ICD-10-CM

## 2020-09-03 DIAGNOSIS — M62838 Other muscle spasm: Secondary | ICD-10-CM

## 2020-09-03 DIAGNOSIS — Z95828 Presence of other vascular implants and grafts: Secondary | ICD-10-CM

## 2020-09-03 LAB — CBC WITH DIFFERENTIAL (CANCER CENTER ONLY)
Abs Immature Granulocytes: 0.01 10*3/uL (ref 0.00–0.07)
Basophils Absolute: 0 10*3/uL (ref 0.0–0.1)
Basophils Relative: 0 %
Eosinophils Absolute: 0 10*3/uL (ref 0.0–0.5)
Eosinophils Relative: 1 %
HCT: 27.2 % — ABNORMAL LOW (ref 36.0–46.0)
Hemoglobin: 9.2 g/dL — ABNORMAL LOW (ref 12.0–15.0)
Immature Granulocytes: 0 %
Lymphocytes Relative: 28 %
Lymphs Abs: 0.9 10*3/uL (ref 0.7–4.0)
MCH: 31.9 pg (ref 26.0–34.0)
MCHC: 33.8 g/dL (ref 30.0–36.0)
MCV: 94.4 fL (ref 80.0–100.0)
Monocytes Absolute: 0.3 10*3/uL (ref 0.1–1.0)
Monocytes Relative: 10 %
Neutro Abs: 2 10*3/uL (ref 1.7–7.7)
Neutrophils Relative %: 61 %
Platelet Count: 41 10*3/uL — ABNORMAL LOW (ref 150–400)
RBC: 2.88 MIL/uL — ABNORMAL LOW (ref 3.87–5.11)
RDW: 14.6 % (ref 11.5–15.5)
WBC Count: 3.3 10*3/uL — ABNORMAL LOW (ref 4.0–10.5)
nRBC: 0 % (ref 0.0–0.2)

## 2020-09-03 LAB — SAMPLE TO BLOOD BANK

## 2020-09-03 MED ORDER — LIDOCAINE-PRILOCAINE 2.5-2.5 % EX CREA: 1.0000 "application " | TOPICAL_CREAM | CUTANEOUS | 5 refills | Status: DC | PRN

## 2020-09-03 MED ORDER — METHOCARBAMOL 500 MG PO TABS: 500.0000 mg | ORAL_TABLET | Freq: Three times a day (TID) | ORAL | 0 refills | Status: DC | PRN

## 2020-09-03 MED ORDER — SODIUM CHLORIDE 0.9% FLUSH
10.0000 mL | INTRAVENOUS | Status: DC | PRN
  Administered 2020-09-03: 11:00:00 10 mL
  Filled 2020-09-03: qty 10

## 2020-09-03 MED ORDER — HEPARIN SOD (PORK) LOCK FLUSH 100 UNIT/ML IV SOLN
500.0000 [IU] | Freq: Once | INTRAVENOUS | Status: AC | PRN
  Administered 2020-09-03: 11:00:00 500 [IU]
  Filled 2020-09-03: qty 5

## 2020-09-03 NOTE — Progress Notes (Signed)
Erin [name] was seen for a brief follow-up of her pancytopenia which was secondary to oral chemotherapy.  Her labs today showed a WBC of 3.3, hemoglobin 9.2, hematocrit 27.2, and platelet count of 41.  These were stable with the patient's platelets being improved.  She was told to continue holding Talzenna until her appointment with [name] in 2 weeks.  She reports having some muscle spasms in her back.  She has not had any changes in activity nor has had trauma.  She was given a prescription for Robaxin to use.  Sandi Mealy, MHS, PA-C  Physician Assistant

## 2020-09-03 NOTE — Patient Instructions (Signed)

## 2020-09-10 ENCOUNTER — Other Ambulatory Visit: Payer: Self-pay | Admitting: Hematology and Oncology

## 2020-09-14 DIAGNOSIS — Z419 Encounter for procedure for purposes other than remedying health state, unspecified: Secondary | ICD-10-CM | POA: Diagnosis not present

## 2020-09-18 ENCOUNTER — Ambulatory Visit (HOSPITAL_COMMUNITY)
Admission: RE | Admit: 2020-09-18 | Discharge: 2020-09-18 | Disposition: A | Discharge status: Home / Self Care | Payer: Medicaid Other | Source: Ambulatory Visit | Attending: Hematology and Oncology | Admitting: Hematology and Oncology

## 2020-09-18 ENCOUNTER — Inpatient Hospital Stay: Payer: Medicare Other | Attending: Gynecologic Oncology

## 2020-09-18 ENCOUNTER — Inpatient Hospital Stay: Payer: Medicare Other

## 2020-09-18 ENCOUNTER — Other Ambulatory Visit: Payer: Self-pay

## 2020-09-18 DIAGNOSIS — D509 Iron deficiency anemia, unspecified: Secondary | ICD-10-CM | POA: Diagnosis not present

## 2020-09-18 DIAGNOSIS — D696 Thrombocytopenia, unspecified: Secondary | ICD-10-CM | POA: Insufficient documentation

## 2020-09-18 DIAGNOSIS — Z452 Encounter for adjustment and management of vascular access device: Secondary | ICD-10-CM | POA: Diagnosis not present

## 2020-09-18 DIAGNOSIS — Z95828 Presence of other vascular implants and grafts: Secondary | ICD-10-CM

## 2020-09-18 DIAGNOSIS — J9811 Atelectasis: Secondary | ICD-10-CM | POA: Diagnosis not present

## 2020-09-18 DIAGNOSIS — Z171 Estrogen receptor negative status [ER-]: Secondary | ICD-10-CM | POA: Insufficient documentation

## 2020-09-18 DIAGNOSIS — Z7984 Long term (current) use of oral hypoglycemic drugs: Secondary | ICD-10-CM | POA: Insufficient documentation

## 2020-09-18 DIAGNOSIS — C50512 Malignant neoplasm of lower-outer quadrant of left female breast: Secondary | ICD-10-CM | POA: Insufficient documentation

## 2020-09-18 DIAGNOSIS — Z9221 Personal history of antineoplastic chemotherapy: Secondary | ICD-10-CM | POA: Diagnosis not present

## 2020-09-18 DIAGNOSIS — C7951 Secondary malignant neoplasm of bone: Secondary | ICD-10-CM | POA: Insufficient documentation

## 2020-09-18 DIAGNOSIS — C50919 Malignant neoplasm of unspecified site of unspecified female breast: Secondary | ICD-10-CM | POA: Diagnosis not present

## 2020-09-18 DIAGNOSIS — K449 Diaphragmatic hernia without obstruction or gangrene: Secondary | ICD-10-CM | POA: Diagnosis not present

## 2020-09-18 DIAGNOSIS — Z79899 Other long term (current) drug therapy: Secondary | ICD-10-CM | POA: Insufficient documentation

## 2020-09-18 DIAGNOSIS — K7689 Other specified diseases of liver: Secondary | ICD-10-CM | POA: Diagnosis not present

## 2020-09-18 DIAGNOSIS — C5701 Malignant neoplasm of right fallopian tube: Secondary | ICD-10-CM | POA: Diagnosis not present

## 2020-09-18 DIAGNOSIS — Z923 Personal history of irradiation: Secondary | ICD-10-CM | POA: Insufficient documentation

## 2020-09-18 LAB — CBC WITH DIFFERENTIAL (CANCER CENTER ONLY)
Abs Immature Granulocytes: 0.05 10*3/uL (ref 0.00–0.07)
Basophils Absolute: 0 10*3/uL (ref 0.0–0.1)
Basophils Relative: 0 %
Eosinophils Absolute: 0 10*3/uL (ref 0.0–0.5)
Eosinophils Relative: 1 %
HCT: 27.9 % — ABNORMAL LOW (ref 36.0–46.0)
Hemoglobin: 9.2 g/dL — ABNORMAL LOW (ref 12.0–15.0)
Immature Granulocytes: 1 %
Lymphocytes Relative: 26 %
Lymphs Abs: 1.1 10*3/uL (ref 0.7–4.0)
MCH: 32.1 pg (ref 26.0–34.0)
MCHC: 33 g/dL (ref 30.0–36.0)
MCV: 97.2 fL (ref 80.0–100.0)
Monocytes Absolute: 0.7 10*3/uL (ref 0.1–1.0)
Monocytes Relative: 17 %
Neutro Abs: 2.3 10*3/uL (ref 1.7–7.7)
Neutrophils Relative %: 55 %
Platelet Count: 173 10*3/uL (ref 150–400)
RBC: 2.87 MIL/uL — ABNORMAL LOW (ref 3.87–5.11)
RDW: 18.3 % — ABNORMAL HIGH (ref 11.5–15.5)
WBC Count: 4.2 10*3/uL (ref 4.0–10.5)
nRBC: 0.5 % — ABNORMAL HIGH (ref 0.0–0.2)

## 2020-09-18 LAB — CMP (CANCER CENTER ONLY)
ALT: 18 U/L (ref 0–44)
AST: 18 U/L (ref 15–41)
Albumin: 3.7 g/dL (ref 3.5–5.0)
Alkaline Phosphatase: 68 U/L (ref 38–126)
Anion gap: 9 (ref 5–15)
BUN: 9 mg/dL (ref 6–20)
CO2: 26 mmol/L (ref 22–32)
Calcium: 9.4 mg/dL (ref 8.9–10.3)
Chloride: 107 mmol/L (ref 98–111)
Creatinine: 0.71 mg/dL (ref 0.44–1.00)
GFR, Estimated: >60 mL/min (ref >60)
Glucose, Bld: 101 mg/dL — ABNORMAL HIGH (ref 70–99)
Potassium: 3.8 mmol/L (ref 3.5–5.1)
Sodium: 142 mmol/L (ref 135–145)
Total Bilirubin: 0.3 mg/dL (ref 0.3–1.2)
Total Protein: 7.4 g/dL (ref 6.5–8.1)

## 2020-09-18 MED ORDER — HEPARIN SOD (PORK) LOCK FLUSH 100 UNIT/ML IV SOLN
INTRAVENOUS | Status: AC
  Filled 2020-09-18: qty 5

## 2020-09-18 MED ORDER — HEPARIN SOD (PORK) LOCK FLUSH 100 UNIT/ML IV SOLN: 500.0000 [IU] | Freq: Once | INTRAVENOUS | Status: DC

## 2020-09-18 MED ORDER — IOHEXOL 300 MG/ML  SOLN
100.0000 mL | Freq: Once | INTRAMUSCULAR | Status: AC | PRN
  Administered 2020-09-18: 100 mL via INTRAVENOUS

## 2020-09-18 MED ORDER — HEPARIN SOD (PORK) LOCK FLUSH 100 UNIT/ML IV SOLN
500.0000 [IU] | Freq: Once | INTRAVENOUS | Status: DC | PRN
  Filled 2020-09-18: qty 5

## 2020-09-18 MED ORDER — SODIUM CHLORIDE 0.9% FLUSH
10.0000 mL | INTRAVENOUS | Status: DC | PRN
  Filled 2020-09-18: qty 10

## 2020-09-18 NOTE — Patient Instructions (Signed)

## 2020-09-19 NOTE — Assessment & Plan Note (Signed)
Left lumpectomy: IDC grade 3, 1.2 cm, DCIS is present, margins negative, 0/6 lymph nodes negative, ER 0%, PR 0%, HER-2 positive ratio 2.28, Ki-67 20%, T1 BN 0 stage IA S/P Taxol-Herceptin BRCA 2 Mutation Positive  10/20/19:Bone mets involving Right hip, Bil Iliac bones, T8, T9, L2 and L3, Rt prox humerus, Righ glenoid. Some lytic and some sclerotic. Mild Left supraclav adenopathy, 2 small lesions basal ganglia (could be lacunar infarcts)  Biopsy the supra clav LN: 10/31/2019: Metastatic breast cancer, ER/PR negative HER-2 equivocal by IHC, FISHpositive Treatmentsummary: Kadcyla every 3-week treatment.Discontinued for progression  MRI of the back 01/07/2020: Extensive metastatic disease to the involved spine. Tumor replacement of L3 vertebral body with pathologic compression fracture, epidural and extraosseous extension. Tumor extending throughout L3 foramen Status post palliative radiation therapyCausing severe dysphagia and dehydration and hospitalization5/17/2021-02/03/2020 ------------------------------------------------------------------------------------------------------------------------------------------------------ Echocardiogram 11/14/2019 Current treatment:Talazoparib started 02/07/2020   Toxicities: Denies any nausea or vomiting Monitoring closely for cytopenias. Anemia: Hemoglobinimproved to 9.2 Thrombocytopenia:Platelets are173. Current dosage of talazoparib to0.75 mg   CT CAP 09/20/19: No evidence of ST mets, tiny inferior right hepatic lesion substantially decreased, bone mets have become sclerotic suggest healing     

## 2020-09-19 NOTE — Progress Notes (Signed)
Patient Care Team: Antony Blackbird, MD (Inactive) as PCP - General (Family Medicine) Excell Seltzer, MD (Inactive) as Consulting Physician (General Surgery) [name], MD as Consulting Physician (Hematology and Oncology) Kyung Rudd, MD as Consulting Physician (Radiation Oncology) [name], MD as Consulting Physician (Gynecologic Oncology) [name], NP as Nurse Practitioner (Hematology and Oncology) Mauro Kaufmann, RN as Oncology Nurse Navigator Rockwell Germany, RN as Oncology Nurse Navigator  DIAGNOSIS:    ICD-10-CM   1. Malignant neoplasm of lower-outer quadrant of left breast of female, estrogen receptor negative ([place])  C50.512 talazoparib tosylate (TALZENNA) 0.25 MG capsule   Z17.1 CBC with Differential ([place] Only)    SUMMARY OF ONCOLOGIC HISTORY: Oncology History  Malignant neoplasm of lower-outer quadrant of left breast of female, estrogen receptor negative ([place])  05/11/2017 Initial Diagnosis   Left breast biopsy 3:30 position 6 cm from nipple: IDC with DCIS, lymphovascular invasion present, grade 2-3, ER 0%, PR 0%, HER-2 positive ratio 2.28, Ki-67 20%, 1 cm lesion left breast, T1b N0 stage IA clinical stage   06/09/2017 Surgery   Left lumpectomy: IDC grade 3, 1.2 cm, DCIS is present, margins negative, 0/6 lymph nodes negative, ER 0%, PR 0%, HER-2 positive ratio 2.28, Ki-67 20%, T1 BN 0 stage IA   07/09/2017 - 09/29/2017 Adjuvant Chemotherapy   Taxol/Herceptin x 12 completed on 09/29/2017, went on to receive every 3 week adjuvant Herceptin through 06/28/2018   11/25/2017 - 01/07/2018 Radiation Therapy    The patient initially received a dose of 50.4 Gy in 28 fractions to the breast using whole-breast tangent fields. This was delivered using a 3-D conformal technique. The patient then received a boost to the seroma. This delivered an additional 10 Gy in 5 fractions using a 3-D technique. The total dose was 60.4 Gy.   02/15/2018 Genetic Testing   The  Common Hereditary Cancer Panel offered by [facility] includes sequencing and/or deletion duplication testing of the following 47 genes: APC, ATM, AXIN2, BARD1, BMPR1A, BRCA1, BRCA2, BRIP1, CDH1, CDKN2A (p14ARF), CDKN2A (p16INK4a), CKD4, CHEK2, CTNNA1, DICER1, EPCAM (Deletion/duplication testing only), GREM1 (promoter region deletion/duplication testing only), KIT, MEN1, MLH1, MSH2, MSH3, MSH6, MUTYH, NBN, NF1, NHTL1, PALB2, PDGFRA, PMS2, POLD1, POLE, PTEN, RAD50, RAD51C, RAD51D, SDHB, SDHC, SDHD, SMAD4, SMARCA4. STK11, TP53, TSC1, TSC2, and VHL.  The following genes were evaluated for sequence changes only: SDHA and HOXB13 c.251G>A variant only.   Results: POSITIVE. 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 The date of this test report is 02/15/2018   10/20/2019 PET scan   Bone mets involving Right hip, Bil Iliac bones, T8, T9, L2 and L3, Rt prox humerus, Righ glenoid. Some lytic and some sclerotic. Mild Left supraclav adenopathy, 2 small lesions basal ganglia (could be lacunar infarcts)   11/14/2019 - 12/26/2019 Chemotherapy   The patient had palonosetron (ALOXI) injection 0.25 mg, 0.25 mg, Intravenous,  Once, 2 of 5 cycles Administration: 0.25 mg (12/05/2019), 0.25 mg (12/26/2019) ado-trastuzumab emtansine (KADCYLA) 260 mg in sodium chloride 0.9 % 250 mL chemo infusion, 3.6 mg/kg = 260 mg, Intravenous, Once, 3 of 6 cycles Administration: 260 mg (11/14/2019), 260 mg (12/05/2019), 260 mg (12/26/2019)  for chemotherapy treatment.    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  Required TPN briefly.  Patient was able to swallow and keep food down by the time of discharge.   02/06/2020 Miscellaneous   Talazoparib  Fallopian tube cancer, carcinoma, right ([place])  11/04/2017 Surgery   Uterus, cervix and bilateral fallopian tubes: Fallopian tube with serous  carcinoma 0.6 cm positive for p53, PAX8, WT-1, MOC-31, cytokeratin 7, and estrogen receptor   01/11/2018 -  Chemotherapy   Taxol and carboplatin every 3 weeks (Taxol discontinued with cycle 2 because of profound rash)   10/20/2019 PET scan   Bone mets involving Right hip, Bil Iliac bones, T8, T9, L2 and L3, Rt prox humerus, Righ glenoid. Some lytic and some sclerotic. Mild Left supraclav adenopathy, 2 small lesions basal ganglia (could be lacunar infarcts)   11/14/2019 - 12/26/2019 Chemotherapy   The patient had palonosetron (ALOXI) injection 0.25 mg, 0.25 mg, Intravenous,  Once, 2 of 5 cycles Administration: 0.25 mg (12/05/2019), 0.25 mg (12/26/2019) ado-trastuzumab emtansine (KADCYLA) 260 mg in sodium chloride 0.9 % 250 mL chemo infusion, 3.6 mg/kg = 260 mg, Intravenous, Once, 3 of 6 cycles Administration: 260 mg (11/14/2019), 260 mg (12/05/2019), 260 mg (12/26/2019)  for chemotherapy treatment.      CHIEF COMPLIANT: Follow-upof metastatic breast cancer, review scan  INTERVAL HISTORY: Erin [name] is a 60 y.o. with above-mentioned history of metastatic breast cancer who is currently on talazopariband Xgeva.CT CAP on 09/19/19 showed significant decrease in the right hepatic lesions, increased sclerosis of the diffuse bony metastases, and no evidence of new soft tissue metastases. She presents to the clinic todayfor follow-up.   Her major complaint today is back spasms 7 out of 10 in pain intensity.  She had taken Robaxin which has not helped her.  She tells me that it bothers her but it is not affecting her activities.  She also tells me that after the last Xgeva injection she did not feel well for a few days.  ALLERGIES:  has No Known Allergies.  MEDICATIONS:  Current Outpatient Medications  Medication Sig Dispense Refill   Accu-Chek FastClix Lancets MISC Use as instructed. Inject into the skin twice daily 100 each 3   atorvastatin (LIPITOR) 40 MG tablet Take 1 tablet (40 mg total) by mouth  daily. 90 tablet 1   Blood Glucose Calibration (ACCU-CHEK GUIDE CONTROL) LIQD 1 each by In Vitro route once as needed for up to 1 dose. 1 each 0   cholecalciferol (VITAMIN D) 1000 units tablet Take 1,000 Units by mouth daily.     ferrous sulfate 325 (65 FE) MG tablet TAKE 1 TABLET (325 MG TOTAL) BY MOUTH DAILY BEFORE BREAKFAST. (Patient not taking: Reported on 02/29/2020) 90 tablet 1   glimepiride (AMARYL) 2 MG tablet TAKE 1 TABLET (2 MG TOTAL) BY MOUTH DAILY WITH BREAKFAST. TO LOWER BLOOD SUGAR 90 tablet 1   glucose blood (ACCU-CHEK GUIDE) test strip Use as instructed. Check blood glucose by fingerstick twice per day. 100 each 12   Lancet Devices (ACCU-CHEK SOFTCLIX) lancets Use as instructed 1 each 0   lidocaine-prilocaine (EMLA) cream Apply 1 application topically as needed. 30 g 5   metFORMIN (GLUCOPHAGE) 500 MG tablet TAKE 2 TABLETS (1,000 MG TOTAL) BY MOUTH 2 (TWO) TIMES DAILY WITH A MEAL. 60 tablet 1   methocarbamol (ROBAXIN) 500 MG tablet Take 1 tablet (500 mg total) by mouth every 8 (eight) hours as needed for muscle spasms. 30 tablet 0   ondansetron (ZOFRAN ODT) 4 MG disintegrating tablet Take 1 tablet (4 mg total) by mouth every 8 (eight) hours as needed for nausea or vomiting. (Patient not taking: Reported on 02/29/2020) 90 tablet 0   pantoprazole (PROTONIX) 40 MG tablet Take  1 tablet (40 mg total) by mouth 2 (two) times daily. 60 tablet 1   talazoparib tosylate (TALZENNA) 0.25 MG capsule Take 2 capsules (0.5 mg total) by mouth daily. 90 capsule 3   No current facility-administered medications for this visit.   Facility-Administered Medications Ordered in Other Visits  Medication Dose Route Frequency Provider Last Rate Last Admin   heparin lock flush 100 unit/mL  500 Units Intracatheter Once PRN [name], MD       sodium chloride flush (NS) 0.9 % injection 10 mL  10 mL Intracatheter PRN [name], MD        PHYSICAL EXAMINATION: ECOG PERFORMANCE STATUS: 1 -  Symptomatic but completely ambulatory  Vitals:   09/20/20 0853  BP: 115/63  Pulse: 83  Resp: 16  Temp: (!) 97.3 F (36.3 C)  SpO2: 100%   Filed Weights   09/20/20 0853  Weight: 142 lb 8 oz (64.6 kg)    LABORATORY DATA:  I have reviewed the data as listed CMP Latest Ref Rng & Units 09/18/2020 08/26/2020 06/14/2020  Glucose 70 - 99 mg/dL 101(H) 77 97  BUN 6 - 20 mg/dL 9 17 8   Creatinine 0.44 - 1.00 mg/dL 0.71 0.70 0.66  Sodium 135 - 145 mmol/L 142 139 139  Potassium 3.5 - 5.1 mmol/L 3.8 3.7 3.8  Chloride 98 - 111 mmol/L 107 108 109  CO2 22 - 32 mmol/L 26 24 22   Calcium 8.9 - 10.3 mg/dL 9.4 9.2 8.7(L)  Total Protein 6.5 - 8.1 g/dL 7.4 7.4 7.3  Total Bilirubin 0.3 - 1.2 mg/dL 0.3 0.5 0.3  Alkaline Phos 38 - 126 U/L 68 70 89  AST 15 - 41 U/L 18 16 20   ALT 0 - 44 U/L 18 15 23     Lab Results  Component Value Date   WBC 4.2 09/18/2020   HGB 9.2 (L) 09/18/2020   HCT 27.9 (L) 09/18/2020   MCV 97.2 09/18/2020   PLT 173 09/18/2020   NEUTROABS 2.3 09/18/2020    ASSESSMENT & PLAN:  Malignant neoplasm of lower-outer quadrant of left breast of female, estrogen receptor negative (HCC) Left lumpectomy: IDC grade 3, 1.2 cm, DCIS is present, margins negative, 0/6 lymph nodes negative, ER 0%, PR 0%, HER-2 positive ratio 2.28, Ki-67 20%, T1 BN 0 stage IA S/P Taxol-Herceptin BRCA 2 Mutation Positive  10/20/19:Bone mets involving Right hip, Bil Iliac bones, T8, T9, L2 and L3, Rt prox humerus, Righ glenoid. Some lytic and some sclerotic. Mild Left supraclav adenopathy, 2 small lesions basal ganglia (could be lacunar infarcts)  Biopsy the supra clav LN: 10/31/2019: Metastatic breast cancer, ER/PR negative HER-2 equivocal by IHC, FISHpositive Treatmentsummary: Kadcyla every 3-week treatment.Discontinued for progression  MRI of the back 01/07/2020: Extensive metastatic disease to the involved spine. Tumor replacement of L3 vertebral body with pathologic compression fracture, epidural and  extraosseous extension. Tumor extending throughout L3 foramen Status post palliative radiation therapyCausing severe dysphagia and dehydration and hospitalization5/17/2021-02/03/2020 ------------------------------------------------------------------------------------------------------------------------------------------------------ Echocardiogram 11/14/2019 Current treatment:Talazoparib started 02/07/2020   Toxicities: Denies any nausea or vomiting Monitoring closely for cytopenias. Anemia: Hemoglobinimproved to 9.2 Thrombocytopenia:Platelets are173.  Current dosage of talazoparib to0.5 mg started 09/20/2020 CT CAP 09/20/19: No evidence of ST mets, tiny inferior right hepatic lesion substantially decreased (3 mm), bone mets have become sclerotic suggest healing  Based on Response to talazoparib, we will reduce the dosage because of cytopenias to 0.5 mg and she will start that today. Return to clinic in 1 week for labs I will see her back  in 2 weeks with labs and follow-up.    Orders Placed This Encounter  Procedures   CBC with Differential ([place] Only)    Standing Status:   Standing    Number of Occurrences:   20    Standing Expiration Date:   09/20/2021   The patient has a good understanding of the overall plan. she agrees with it. she will call with any problems that may develop before the next visit here.  Total time spent: 30 mins including face to face time and time spent for planning, charting and coordination of care  [name], MD 09/20/2020  I, Cloyde Reams Dorshimer, am acting as scribe for [name].  I have reviewed the above documentation for accuracy and completeness, and I agree with the above.

## 2020-09-20 ENCOUNTER — Other Ambulatory Visit: Payer: Self-pay

## 2020-09-20 ENCOUNTER — Inpatient Hospital Stay (HOSPITAL_BASED_OUTPATIENT_CLINIC_OR_DEPARTMENT_OTHER): Payer: Medicare Other | Admitting: Hematology and Oncology

## 2020-09-20 ENCOUNTER — Other Ambulatory Visit: Payer: Self-pay | Admitting: Hematology and Oncology

## 2020-09-20 DIAGNOSIS — C7951 Secondary malignant neoplasm of bone: Secondary | ICD-10-CM | POA: Diagnosis not present

## 2020-09-20 DIAGNOSIS — Z79899 Other long term (current) drug therapy: Secondary | ICD-10-CM | POA: Diagnosis not present

## 2020-09-20 DIAGNOSIS — C50512 Malignant neoplasm of lower-outer quadrant of left female breast: Secondary | ICD-10-CM

## 2020-09-20 DIAGNOSIS — Z171 Estrogen receptor negative status [ER-]: Secondary | ICD-10-CM

## 2020-09-20 DIAGNOSIS — Z7984 Long term (current) use of oral hypoglycemic drugs: Secondary | ICD-10-CM | POA: Diagnosis not present

## 2020-09-20 DIAGNOSIS — Z923 Personal history of irradiation: Secondary | ICD-10-CM | POA: Diagnosis not present

## 2020-09-20 DIAGNOSIS — C5701 Malignant neoplasm of right fallopian tube: Secondary | ICD-10-CM | POA: Diagnosis not present

## 2020-09-20 DIAGNOSIS — Z452 Encounter for adjustment and management of vascular access device: Secondary | ICD-10-CM | POA: Diagnosis not present

## 2020-09-20 DIAGNOSIS — D696 Thrombocytopenia, unspecified: Secondary | ICD-10-CM | POA: Diagnosis not present

## 2020-09-20 DIAGNOSIS — D509 Iron deficiency anemia, unspecified: Secondary | ICD-10-CM | POA: Diagnosis not present

## 2020-09-20 DIAGNOSIS — Z9221 Personal history of antineoplastic chemotherapy: Secondary | ICD-10-CM | POA: Diagnosis not present

## 2020-09-20 MED ORDER — TALAZOPARIB TOSYLATE 0.25 MG PO CAPS: 0.5000 mg | ORAL_CAPSULE | Freq: Every day | ORAL | 3 refills | Status: DC

## 2020-09-23 MED FILL — TALZENNA 0.25 MG CAPS: 0.25 | 30 days supply | Qty: 90 | Fill #2

## 2020-09-27 ENCOUNTER — Other Ambulatory Visit: Payer: Self-pay

## 2020-09-27 ENCOUNTER — Inpatient Hospital Stay: Payer: Medicare Other

## 2020-09-27 DIAGNOSIS — Z9221 Personal history of antineoplastic chemotherapy: Secondary | ICD-10-CM | POA: Diagnosis not present

## 2020-09-27 DIAGNOSIS — Z171 Estrogen receptor negative status [ER-]: Secondary | ICD-10-CM

## 2020-09-27 DIAGNOSIS — D696 Thrombocytopenia, unspecified: Secondary | ICD-10-CM | POA: Diagnosis not present

## 2020-09-27 DIAGNOSIS — C50512 Malignant neoplasm of lower-outer quadrant of left female breast: Secondary | ICD-10-CM

## 2020-09-27 DIAGNOSIS — Z452 Encounter for adjustment and management of vascular access device: Secondary | ICD-10-CM | POA: Diagnosis not present

## 2020-09-27 DIAGNOSIS — Z923 Personal history of irradiation: Secondary | ICD-10-CM | POA: Diagnosis not present

## 2020-09-27 DIAGNOSIS — Z7984 Long term (current) use of oral hypoglycemic drugs: Secondary | ICD-10-CM | POA: Diagnosis not present

## 2020-09-27 DIAGNOSIS — C5701 Malignant neoplasm of right fallopian tube: Secondary | ICD-10-CM | POA: Diagnosis not present

## 2020-09-27 DIAGNOSIS — C7951 Secondary malignant neoplasm of bone: Secondary | ICD-10-CM | POA: Diagnosis not present

## 2020-09-27 DIAGNOSIS — D509 Iron deficiency anemia, unspecified: Secondary | ICD-10-CM | POA: Diagnosis not present

## 2020-09-27 DIAGNOSIS — Z79899 Other long term (current) drug therapy: Secondary | ICD-10-CM | POA: Diagnosis not present

## 2020-09-27 LAB — CBC WITH DIFFERENTIAL (CANCER CENTER ONLY)
Abs Immature Granulocytes: 0.04 10*3/uL (ref 0.00–0.07)
Basophils Absolute: 0 10*3/uL (ref 0.0–0.1)
Basophils Relative: 0 %
Eosinophils Absolute: 0 10*3/uL (ref 0.0–0.5)
Eosinophils Relative: 0 %
HCT: 30.7 % — ABNORMAL LOW (ref 36.0–46.0)
Hemoglobin: 9.8 g/dL — ABNORMAL LOW (ref 12.0–15.0)
Immature Granulocytes: 1 %
Lymphocytes Relative: 18 %
Lymphs Abs: 0.9 10*3/uL (ref 0.7–4.0)
MCH: 32 pg (ref 26.0–34.0)
MCHC: 31.9 g/dL (ref 30.0–36.0)
MCV: 100.3 fL — ABNORMAL HIGH (ref 80.0–100.0)
Monocytes Absolute: 0.6 10*3/uL (ref 0.1–1.0)
Monocytes Relative: 11 %
Neutro Abs: 3.6 10*3/uL (ref 1.7–7.7)
Neutrophils Relative %: 70 %
Platelet Count: 230 10*3/uL (ref 150–400)
RBC: 3.06 MIL/uL — ABNORMAL LOW (ref 3.87–5.11)
RDW: 19 % — ABNORMAL HIGH (ref 11.5–15.5)
WBC Count: 5.2 10*3/uL (ref 4.0–10.5)
nRBC: 0 % (ref 0.0–0.2)

## 2020-10-02 ENCOUNTER — Other Ambulatory Visit: Payer: Self-pay | Admitting: Hematology and Oncology

## 2020-10-02 NOTE — Progress Notes (Signed)
Patient Care Team: Antony Blackbird, MD (Inactive) as PCP - General (Family Medicine) Excell Seltzer, MD (Inactive) as Consulting Physician (General Surgery) [name], MD as Consulting Physician (Hematology and Oncology) Kyung Rudd, MD as Consulting Physician (Radiation Oncology) [name], MD as Consulting Physician (Gynecologic Oncology) Delice Bison, Charlestine Massed, NP as Nurse Practitioner (Hematology and Oncology) Mauro Kaufmann, RN as Oncology Nurse Navigator Rockwell Germany, RN as Oncology Nurse Navigator  DIAGNOSIS:    ICD-10-CM   1. Metastatic breast cancer ([place])  C50.919 CBC with Differential ([place])    CMP ([place] only)  2. Iron deficiency anemia, unspecified iron deficiency anemia type  D50.9 ferrous sulfate 325 (65 FE) MG tablet  3. Malignant neoplasm of lower-outer quadrant of left breast of female, estrogen receptor negative ([place])  C50.512    Z17.1     SUMMARY OF ONCOLOGIC HISTORY: Oncology History  Malignant neoplasm of lower-outer quadrant of left breast of female, estrogen receptor negative ([place])  05/11/2017 Initial Diagnosis   Left breast biopsy 3:30 position 6 cm from nipple: IDC with DCIS, lymphovascular invasion present, grade 2-3, ER 0%, PR 0%, HER-2 positive ratio 2.28, Ki-67 20%, 1 cm lesion left breast, T1b N0 stage IA clinical stage   06/09/2017 Surgery   Left lumpectomy: IDC grade 3, 1.2 cm, DCIS is present, margins negative, 0/6 lymph nodes negative, ER 0%, PR 0%, HER-2 positive ratio 2.28, Ki-67 20%, T1 BN 0 stage IA   07/09/2017 - 09/29/2017 Adjuvant Chemotherapy   Taxol/Herceptin x 12 completed on 09/29/2017, went on to receive every 3 week adjuvant Herceptin through 06/28/2018   11/25/2017 - 01/07/2018 Radiation Therapy    The patient initially received a dose of 50.4 Gy in 28 fractions to the breast using whole-breast tangent fields. This was delivered using a 3-D conformal technique. The patient then received a boost to the  seroma. This delivered an additional 10 Gy in 5 fractions using a 3-D technique. The total dose was 60.4 Gy.   02/15/2018 Genetic Testing   The Common Hereditary Cancer Panel offered by [facility] includes sequencing and/or deletion duplication testing of the following 47 genes: APC, ATM, AXIN2, BARD1, BMPR1A, BRCA1, BRCA2, BRIP1, CDH1, CDKN2A (p14ARF), CDKN2A (p16INK4a), CKD4, CHEK2, CTNNA1, DICER1, EPCAM (Deletion/duplication testing only), GREM1 (promoter region deletion/duplication testing only), KIT, MEN1, MLH1, MSH2, MSH3, MSH6, MUTYH, NBN, NF1, NHTL1, PALB2, PDGFRA, PMS2, POLD1, POLE, PTEN, RAD50, RAD51C, RAD51D, SDHB, SDHC, SDHD, SMAD4, SMARCA4. STK11, TP53, TSC1, TSC2, and VHL.  The following genes were evaluated for sequence changes only: SDHA and HOXB13 c.251G>A variant only.   Results: POSITIVE. 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 The date of this test report is 02/15/2018   10/20/2019 PET scan   Bone mets involving Right hip, Bil Iliac bones, T8, T9, L2 and L3, Rt prox humerus, Righ glenoid. Some lytic and some sclerotic. Mild Left supraclav adenopathy, 2 small lesions basal ganglia (could be lacunar infarcts)   11/14/2019 - 12/26/2019 Chemotherapy   The patient had palonosetron (ALOXI) injection 0.25 mg, 0.25 mg, Intravenous,  Once, 2 of 5 cycles Administration: 0.25 mg (12/05/2019), 0.25 mg (12/26/2019) ado-trastuzumab emtansine (KADCYLA) 260 mg in sodium chloride 0.9 % 250 mL chemo infusion, 3.6 mg/kg = 260 mg, Intravenous, Once, 3 of 6 cycles Administration: 260 mg (11/14/2019), 260 mg (12/05/2019), 260 mg (12/26/2019)  for chemotherapy treatment.    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  Required TPN briefly.  Patient was able to swallow and keep food down by the time of discharge.   02/06/2020 Miscellaneous   Talazoparib    Fallopian tube cancer, carcinoma, right ([place])  11/04/2017 Surgery   Uterus, cervix and bilateral fallopian tubes: Fallopian tube with serous carcinoma 0.6 cm positive for p53, PAX8, WT-1, MOC-31, cytokeratin 7, and estrogen receptor   01/11/2018 -  Chemotherapy   Taxol and carboplatin every 3 weeks (Taxol discontinued with cycle 2 because of profound rash)   10/20/2019 PET scan   Bone mets involving Right hip, Bil Iliac bones, T8, T9, L2 and L3, Rt prox humerus, Righ glenoid. Some lytic and some sclerotic. Mild Left supraclav adenopathy, 2 small lesions basal ganglia (could be lacunar infarcts)   11/14/2019 - 12/26/2019 Chemotherapy   The patient had palonosetron (ALOXI) injection 0.25 mg, 0.25 mg, Intravenous,  Once, 2 of 5 cycles Administration: 0.25 mg (12/05/2019), 0.25 mg (12/26/2019) ado-trastuzumab emtansine (KADCYLA) 260 mg in sodium chloride 0.9 % 250 mL chemo infusion, 3.6 mg/kg = 260 mg, Intravenous, Once, 3 of 6 cycles Administration: 260 mg (11/14/2019), 260 mg (12/05/2019), 260 mg (12/26/2019)  for chemotherapy treatment.      CHIEF COMPLIANT: Follow-upof metastatic breast cancer  INTERVAL HISTORY: Erin [name] is a 60 y.o. with above-mentioned history of metastatic breast cancer who is currently on talazopariband Xgeva. She presents to the clinic todayfor a toxicity check.  She is tolerating talazoparib extremely well without any problems or concerns.  She does have mild fatigue.  Denies any nausea or vomiting.  She has not had any bruising or bleeding problems.  ALLERGIES:  has No Known Allergies.  MEDICATIONS:  Current Outpatient Medications  Medication Sig Dispense Refill   Accu-Chek FastClix Lancets MISC Use as instructed. Inject into the skin twice daily 100 each 3   atorvastatin (LIPITOR) 40 MG tablet Take 1 tablet (40 mg total) by mouth daily. 90 tablet 1   Blood Glucose Calibration (ACCU-CHEK GUIDE CONTROL) LIQD 1 each by In Vitro route once as needed for up to 1  dose. 1 each 0   cholecalciferol (VITAMIN D) 1000 units tablet Take 1,000 Units by mouth daily.     ferrous sulfate 325 (65 FE) MG tablet Take 1 tablet (325 mg total) by mouth daily before breakfast. 90 tablet 1   glimepiride (AMARYL) 2 MG tablet TAKE 1 TABLET (2 MG TOTAL) BY MOUTH DAILY WITH BREAKFAST. TO LOWER BLOOD SUGAR 90 tablet 1   glucose blood (ACCU-CHEK GUIDE) test strip Use as instructed. Check blood glucose by fingerstick twice per day. 100 each 12   Lancet Devices (ACCU-CHEK SOFTCLIX) lancets Use as instructed 1 each 0   lidocaine-prilocaine (EMLA) cream Apply 1 application topically as needed. 30 g 5   metFORMIN (GLUCOPHAGE) 500 MG tablet TAKE 2 TABLETS (1,000 MG TOTAL) BY MOUTH 2 (TWO) TIMES DAILY WITH A MEAL. 60 tablet 1   talazoparib tosylate (TALZENNA) 0.25 MG capsule Take 2 capsules (0.5 mg total) by mouth daily. 90 capsule 3   No current facility-administered medications for this visit.   Facility-Administered Medications Ordered in Other Visits  Medication Dose Route Frequency Provider Last Rate Last Admin   heparin lock flush 100 unit/mL  500 Units Intracatheter Once PRN [name], MD       sodium chloride flush (NS) 0.9 % injection 10 mL  10 mL Intracatheter PRN [name], MD        PHYSICAL EXAMINATION: ECOG PERFORMANCE STATUS:  1 - Symptomatic but completely ambulatory  Vitals:   10/03/20 0809  BP: 112/71  Pulse: 92  Resp: 17  Temp: 97.7 F (36.5 C)  SpO2: 99%   Filed Weights   10/03/20 0809  Weight: 143 lb 11.2 oz (65.2 kg)    LABORATORY DATA:  I have reviewed the data as listed CMP Latest Ref Rng & Units 09/18/2020 08/26/2020 06/14/2020  Glucose 70 - 99 mg/dL 101(H) 77 97  BUN 6 - 20 mg/dL _0 Creatinine 0.44 - 1.00 mg/dL 0.71 0.70 0.66  Sodium 135 - 145 mmol/L 142 139 139  Potassium 3.5 - 5.1 mmol/L 3.8 3.7 3.8  Chloride 98 - 111 mmol/L 107 108 109  CO2 22 - 32 mmol/L _1 Calcium 8.9 - 10.3 mg/dL 9.4 9.2 8.7(L)  Total  Protein 6.5 - 8.1 g/dL 7.4 7.4 7.3  Total Bilirubin 0.3 - 1.2 mg/dL 0.3 0.5 0.3  Alkaline Phos 38 - 126 U/L 68 70 89  AST 15 - 41 U/L _2 ALT 0 - 44 U/L _3 Lab Results  Component Value Date   WBC 5.5 10/03/2020   HGB 9.5 (L) 10/03/2020   HCT 29.2 (L) 10/03/2020   MCV 100.3 (H) 10/03/2020   PLT 165 10/03/2020   NEUTROABS 3.9 10/03/2020    ASSESSMENT & PLAN:  Malignant neoplasm of lower-outer quadrant of left breast of female, estrogen receptor negative ([facility]) Left lumpectomy: IDC grade 3, 1.2 cm, DCIS is present, margins negative, 0/6 lymph nodes negative, ER 0%, PR 0%, HER-2 positive ratio 2.28, Ki-67 20%, T1 BN 0 stage IA S/P Taxol-Herceptin BRCA 2 Mutation Positive  10/20/19:Bone mets involving Right hip, Bil Iliac bones, T8, T9, L2 and L3, Rt prox humerus, Righ glenoid. Some lytic and some sclerotic. Mild Left supraclav adenopathy, 2 small lesions basal ganglia (could be lacunar infarcts)  Biopsy the supra clav LN: 10/31/2019: Metastatic breast cancer, ER/PR negative HER-2 equivocal by IHC, FISHpositive Treatmentsummary: Kadcyla every 3-week treatment.Discontinued for progression  MRI of the back 01/07/2020: Extensive metastatic disease to the involved spine. Tumor replacement of L3 vertebral body with pathologic compression fracture, epidural and extraosseous extension. Tumor extending throughout L3 foramen Status post palliative radiation therapyCausing severe dysphagia and dehydration and hospitalization5/17/2021-02/03/2020 ------------------------------------------------------------------------------------------------------------------------------------------------------ Echocardiogram 11/14/2019 Current treatment:Talazoparib started 02/07/2020   Toxicities: Denies any nausea or vomiting Monitoring closely for cytopenias. Anemia: Hemoglobinimproved to 9.5, I recommended that she resume iron pills.  I sent a prescription. Thrombocytopenia:Platelets  [id number].  Current dosage of talazoparib to0.5 mg started 09/20/2020 CT CAP 09/20/19: No evidence of ST mets, tiny inferior right hepatic lesion substantially decreased (3 mm), bone mets have become sclerotic suggest healing  Return to clinic in 6 weeks with labs and follow-up Our plan is to obtain scans in 3 months.      Orders Placed This Encounter  Procedures   CBC with Differential ([place] Only)    Standing Status:   Future    Standing Expiration Date:   10/03/2021   CMP ([place] only)    Standing Status:   Future    Standing Expiration Date:   10/03/2021   The patient has a good understanding of the overall plan. she agrees with it. she will call with any problems that may develop before the next visit here.  Total time spent: 30 mins including face to face time and time spent for planning, charting and coordination of care  [name], MD 10/03/2020  I, Molly Dorshimer, am acting as scribe for Dr. Jesaiah Fabiano. ° °I have reviewed the above documentation for accuracy and completeness, and I agree with the above. ° ° ° ° ° ° °

## 2020-10-03 ENCOUNTER — Telehealth: Payer: Self-pay | Admitting: Hematology and Oncology

## 2020-10-03 ENCOUNTER — Inpatient Hospital Stay (HOSPITAL_BASED_OUTPATIENT_CLINIC_OR_DEPARTMENT_OTHER): Payer: Medicare Other | Admitting: Hematology and Oncology

## 2020-10-03 ENCOUNTER — Other Ambulatory Visit: Payer: Self-pay

## 2020-10-03 ENCOUNTER — Inpatient Hospital Stay: Payer: Medicare Other

## 2020-10-03 VITALS — BP 112/71 | HR 92 | Temp 97.7°F | Resp 17 | Ht 61.0 in | Wt 143.7 lb

## 2020-10-03 DIAGNOSIS — C50919 Malignant neoplasm of unspecified site of unspecified female breast: Secondary | ICD-10-CM | POA: Diagnosis not present

## 2020-10-03 DIAGNOSIS — Z9221 Personal history of antineoplastic chemotherapy: Secondary | ICD-10-CM | POA: Diagnosis not present

## 2020-10-03 DIAGNOSIS — Z7984 Long term (current) use of oral hypoglycemic drugs: Secondary | ICD-10-CM | POA: Diagnosis not present

## 2020-10-03 DIAGNOSIS — Z923 Personal history of irradiation: Secondary | ICD-10-CM | POA: Diagnosis not present

## 2020-10-03 DIAGNOSIS — C7951 Secondary malignant neoplasm of bone: Secondary | ICD-10-CM | POA: Diagnosis not present

## 2020-10-03 DIAGNOSIS — Z171 Estrogen receptor negative status [ER-]: Secondary | ICD-10-CM | POA: Diagnosis not present

## 2020-10-03 DIAGNOSIS — C50512 Malignant neoplasm of lower-outer quadrant of left female breast: Secondary | ICD-10-CM | POA: Diagnosis not present

## 2020-10-03 DIAGNOSIS — Z79899 Other long term (current) drug therapy: Secondary | ICD-10-CM | POA: Diagnosis not present

## 2020-10-03 DIAGNOSIS — C5701 Malignant neoplasm of right fallopian tube: Secondary | ICD-10-CM | POA: Diagnosis not present

## 2020-10-03 DIAGNOSIS — Z452 Encounter for adjustment and management of vascular access device: Secondary | ICD-10-CM | POA: Diagnosis not present

## 2020-10-03 DIAGNOSIS — D509 Iron deficiency anemia, unspecified: Secondary | ICD-10-CM

## 2020-10-03 DIAGNOSIS — D696 Thrombocytopenia, unspecified: Secondary | ICD-10-CM | POA: Diagnosis not present

## 2020-10-03 LAB — CBC WITH DIFFERENTIAL (CANCER CENTER ONLY)
Abs Immature Granulocytes: 0.03 10*3/uL (ref 0.00–0.07)
Basophils Absolute: 0 10*3/uL (ref 0.0–0.1)
Basophils Relative: 0 %
Eosinophils Absolute: 0 10*3/uL (ref 0.0–0.5)
Eosinophils Relative: 1 %
HCT: 29.2 % — ABNORMAL LOW (ref 36.0–46.0)
Hemoglobin: 9.5 g/dL — ABNORMAL LOW (ref 12.0–15.0)
Immature Granulocytes: 1 %
Lymphocytes Relative: 16 %
Lymphs Abs: 0.9 10*3/uL (ref 0.7–4.0)
MCH: 32.6 pg (ref 26.0–34.0)
MCHC: 32.5 g/dL (ref 30.0–36.0)
MCV: 100.3 fL — ABNORMAL HIGH (ref 80.0–100.0)
Monocytes Absolute: 0.6 10*3/uL (ref 0.1–1.0)
Monocytes Relative: 11 %
Neutro Abs: 3.9 10*3/uL (ref 1.7–7.7)
Neutrophils Relative %: 71 %
Platelet Count: 165 10*3/uL (ref 150–400)
RBC: 2.91 MIL/uL — ABNORMAL LOW (ref 3.87–5.11)
RDW: 20.1 % — ABNORMAL HIGH (ref 11.5–15.5)
WBC Count: 5.5 10*3/uL (ref 4.0–10.5)
nRBC: 0 % (ref 0.0–0.2)

## 2020-10-03 MED ORDER — FERROUS SULFATE 325 (65 FE) MG PO TABS: 325.0000 mg | ORAL_TABLET | Freq: Every day | ORAL | 1 refills | Status: DC

## 2020-10-03 NOTE — Assessment & Plan Note (Signed)
Left lumpectomy: IDC grade 3, 1.2 cm, DCIS is present, margins negative, 0/6 lymph nodes negative, ER 0%, PR 0%, HER-2 positive ratio 2.28, Ki-67 20%, T1 BN 0 stage IA S/P Taxol-Herceptin BRCA 2 Mutation Positive  10/20/19:Bone mets involving Right hip, Bil Iliac bones, T8, T9, L2 and L3, Rt prox humerus, Righ glenoid. Some lytic and some sclerotic. Mild Left supraclav adenopathy, 2 small lesions basal ganglia (could be lacunar infarcts)  Biopsy the supra clav LN: 10/31/2019: Metastatic breast cancer, ER/PR negative HER-2 equivocal by IHC, FISHpositive Treatmentsummary: Kadcyla every 3-week treatment.Discontinued for progression  MRI of the back 01/07/2020: Extensive metastatic disease to the involved spine. Tumor replacement of L3 vertebral body with pathologic compression fracture, epidural and extraosseous extension. Tumor extending throughout L3 foramen Status post palliative radiation therapyCausing severe dysphagia and dehydration and hospitalization5/17/2021-02/03/2020 ------------------------------------------------------------------------------------------------------------------------------------------------------ Echocardiogram 11/14/2019 Current treatment:Talazoparib started 02/07/2020   Toxicities: Denies any nausea or vomiting Monitoring closely for cytopenias. Anemia: Hemoglobinimproved to 9.5 Thrombocytopenia:Platelets are165.  Current dosage of talazoparib to0.5 mg started 09/20/2020 CT CAP 09/20/19: No evidence of ST mets, tiny inferior right hepatic lesion substantially decreased (3 mm), bone mets have become sclerotic suggest healing  Return to clinic in 6 weeks with labs and follow-up Our plan is to obtain scans in 3 months.

## 2020-10-03 NOTE — Telephone Encounter (Signed)
Scheduled follow-up appointment per 1/20 los. Patient is aware. °

## 2020-10-10 ENCOUNTER — Other Ambulatory Visit: Payer: Self-pay | Admitting: Hematology and Oncology

## 2020-10-15 DIAGNOSIS — Z419 Encounter for procedure for purposes other than remedying health state, unspecified: Secondary | ICD-10-CM | POA: Diagnosis not present

## 2020-10-27 ENCOUNTER — Other Ambulatory Visit: Payer: Self-pay | Admitting: Hematology and Oncology

## 2020-11-12 DIAGNOSIS — Z419 Encounter for procedure for purposes other than remedying health state, unspecified: Secondary | ICD-10-CM | POA: Diagnosis not present

## 2020-11-13 NOTE — Progress Notes (Signed)
error 

## 2020-11-14 ENCOUNTER — Inpatient Hospital Stay: Payer: Medicare Other

## 2020-11-14 ENCOUNTER — Inpatient Hospital Stay: Payer: Medicare Other | Attending: Gynecologic Oncology | Admitting: Hematology and Oncology

## 2020-11-14 ENCOUNTER — Other Ambulatory Visit: Payer: Self-pay

## 2020-11-14 VITALS — BP 112/61 | HR 93 | Temp 97.9°F | Resp 20 | Ht 61.0 in | Wt 143.1 lb

## 2020-11-14 DIAGNOSIS — C50512 Malignant neoplasm of lower-outer quadrant of left female breast: Secondary | ICD-10-CM | POA: Diagnosis not present

## 2020-11-14 DIAGNOSIS — C7951 Secondary malignant neoplasm of bone: Secondary | ICD-10-CM | POA: Insufficient documentation

## 2020-11-14 DIAGNOSIS — C50919 Malignant neoplasm of unspecified site of unspecified female breast: Secondary | ICD-10-CM | POA: Diagnosis not present

## 2020-11-14 DIAGNOSIS — Z79899 Other long term (current) drug therapy: Secondary | ICD-10-CM | POA: Diagnosis not present

## 2020-11-14 DIAGNOSIS — Z95828 Presence of other vascular implants and grafts: Secondary | ICD-10-CM

## 2020-11-14 DIAGNOSIS — Z171 Estrogen receptor negative status [ER-]: Secondary | ICD-10-CM | POA: Diagnosis not present

## 2020-11-14 DIAGNOSIS — Z7984 Long term (current) use of oral hypoglycemic drugs: Secondary | ICD-10-CM | POA: Insufficient documentation

## 2020-11-14 DIAGNOSIS — D649 Anemia, unspecified: Secondary | ICD-10-CM | POA: Diagnosis not present

## 2020-11-14 LAB — CBC WITH DIFFERENTIAL (CANCER CENTER ONLY)
Abs Immature Granulocytes: 0.01 10*3/uL (ref 0.00–0.07)
Basophils Absolute: 0 10*3/uL (ref 0.0–0.1)
Basophils Relative: 0 %
Eosinophils Absolute: 0.1 10*3/uL (ref 0.0–0.5)
Eosinophils Relative: 1 %
HCT: 29.8 % — ABNORMAL LOW (ref 36.0–46.0)
Hemoglobin: 9.8 g/dL — ABNORMAL LOW (ref 12.0–15.0)
Immature Granulocytes: 0 %
Lymphocytes Relative: 22 %
Lymphs Abs: 1 10*3/uL (ref 0.7–4.0)
MCH: 34.5 pg — ABNORMAL HIGH (ref 26.0–34.0)
MCHC: 32.9 g/dL (ref 30.0–36.0)
MCV: 104.9 fL — ABNORMAL HIGH (ref 80.0–100.0)
Monocytes Absolute: 0.4 10*3/uL (ref 0.1–1.0)
Monocytes Relative: 8 %
Neutro Abs: 3.1 10*3/uL (ref 1.7–7.7)
Neutrophils Relative %: 69 %
Platelet Count: 63 10*3/uL — ABNORMAL LOW (ref 150–400)
RBC: 2.84 MIL/uL — ABNORMAL LOW (ref 3.87–5.11)
RDW: 18.1 % — ABNORMAL HIGH (ref 11.5–15.5)
WBC Count: 4.6 10*3/uL (ref 4.0–10.5)
nRBC: 0 % (ref 0.0–0.2)

## 2020-11-14 LAB — CMP (CANCER CENTER ONLY)
ALT: 18 U/L (ref 0–44)
AST: 18 U/L (ref 15–41)
Albumin: 3.7 g/dL (ref 3.5–5.0)
Alkaline Phosphatase: 67 U/L (ref 38–126)
Anion gap: 10 (ref 5–15)
BUN: 9 mg/dL (ref 6–20)
CO2: 22 mmol/L (ref 22–32)
Calcium: 8.8 mg/dL — ABNORMAL LOW (ref 8.9–10.3)
Chloride: 106 mmol/L (ref 98–111)
Creatinine: 0.67 mg/dL (ref 0.44–1.00)
GFR, Estimated: >60 mL/min (ref >60)
Glucose, Bld: 91 mg/dL (ref 70–99)
Potassium: 3.9 mmol/L (ref 3.5–5.1)
Sodium: 138 mmol/L (ref 135–145)
Total Bilirubin: <0.2 mg/dL — ABNORMAL LOW (ref 0.3–1.2)
Total Protein: 7.4 g/dL (ref 6.5–8.1)

## 2020-11-14 MED ORDER — DENOSUMAB 120 MG/1.7ML ~~LOC~~ SOLN
SUBCUTANEOUS | Status: AC
  Filled 2020-11-14: qty 1.7

## 2020-11-14 MED ORDER — HEPARIN SOD (PORK) LOCK FLUSH 100 UNIT/ML IV SOLN
500.0000 [IU] | Freq: Once | INTRAVENOUS | Status: AC | PRN
  Administered 2020-11-14: 500 [IU]
  Filled 2020-11-14: qty 5

## 2020-11-14 MED ORDER — DENOSUMAB 120 MG/1.7ML ~~LOC~~ SOLN
120.0000 mg | Freq: Once | SUBCUTANEOUS | Status: AC
  Administered 2020-11-14: 120 mg via SUBCUTANEOUS

## 2020-11-14 MED ORDER — SODIUM CHLORIDE 0.9% FLUSH
10.0000 mL | INTRAVENOUS | Status: DC | PRN
  Administered 2020-11-14: 10 mL
  Filled 2020-11-14: qty 10

## 2020-11-14 NOTE — Assessment & Plan Note (Addendum)
Left lumpectomy: IDC grade 3, 1.2 cm, DCIS is present, margins negative, 0/6 lymph nodes negative, ER 0%, PR 0%, HER-2 positive ratio 2.28, Ki-67 20%, T1 BN 0 stage IA S/P Taxol-Herceptin BRCA 2 Mutation Positive  10/20/19:Bone mets involving Right hip, Bil Iliac bones, T8, T9, L2 and L3, Rt prox humerus, Righ glenoid. Some lytic and some sclerotic. Mild Left supraclav adenopathy, 2 small lesions basal ganglia (could be lacunar infarcts)  Biopsy the supra clav LN: 10/31/2019: Metastatic breast cancer, ER/PR negative HER-2 equivocal by IHC, FISHpositive Treatmentsummary: Kadcyla every 3-week treatment.Discontinued for progression  MRI of the back 01/07/2020: Extensive metastatic disease to the involved spine. Tumor replacement of L3 vertebral body with pathologic compression fracture, epidural and extraosseous extension. Tumor extending throughout L3 foramen Status post palliative radiation therapyCausing severe dysphagia and dehydration and hospitalization5/17/2021-02/03/2020 ------------------------------------------------------------------------------------------------------------------------------------------------------ Echocardiogram 11/14/2019 Current treatment:Talazoparib started 02/07/2020   Toxicities: Denies any nausea or vomiting Monitoring closely for cytopenias. Anemia: Hemoglobinimproved to 9.5, I recommended that she resume iron pills.  I sent a prescription. Thrombocytopenia:Platelets [id number].  Currentdosage of talazoparib to0.5 mgstarted 09/20/2020 CT CAP 09/20/19: No evidence of ST mets, tiny inferior right hepatic lesion substantially decreased(3 mm), bone mets have become sclerotic suggest healing  Return to clinic in 6 weeks with labs, scans  and follow-up .

## 2020-11-14 NOTE — Progress Notes (Signed)
Patient Care Team: Antony Blackbird, MD (Inactive) as PCP - General (Family Medicine) Excell Seltzer, MD (Inactive) as Consulting Physician (General Surgery) [name], MD as Consulting Physician (Hematology and Oncology) Kyung Rudd, MD as Consulting Physician (Radiation Oncology) [name], MD as Consulting Physician (Gynecologic Oncology) Delice Bison, Charlestine Massed, NP as Nurse Practitioner (Hematology and Oncology) Mauro Kaufmann, RN as Oncology Nurse Navigator Rockwell Germany, RN as Oncology Nurse Navigator  DIAGNOSIS:    ICD-10-CM   1. Metastatic breast cancer ([place])  C50.919 CT CHEST ABDOMEN PELVIS W CONTRAST  2. Malignant neoplasm of lower-outer quadrant of left breast of female, estrogen receptor negative ([place])  C50.512 CT CHEST ABDOMEN PELVIS W CONTRAST   Z17.1     SUMMARY OF ONCOLOGIC HISTORY: Oncology History  Malignant neoplasm of lower-outer quadrant of left breast of female, estrogen receptor negative ([place])  05/11/2017 Initial Diagnosis   Left breast biopsy 3:30 position 6 cm from nipple: IDC with DCIS, lymphovascular invasion present, grade 2-3, ER 0%, PR 0%, HER-2 positive ratio 2.28, Ki-67 20%, 1 cm lesion left breast, T1b N0 stage IA clinical stage   06/09/2017 Surgery   Left lumpectomy: IDC grade 3, 1.2 cm, DCIS is present, margins negative, 0/6 lymph nodes negative, ER 0%, PR 0%, HER-2 positive ratio 2.28, Ki-67 20%, T1 BN 0 stage IA   07/09/2017 - 09/29/2017 Adjuvant Chemotherapy   Taxol/Herceptin x 12 completed on 09/29/2017, went on to receive every 3 week adjuvant Herceptin through 06/28/2018   11/25/2017 - 01/07/2018 Radiation Therapy    The patient initially received a dose of 50.4 Gy in 28 fractions to the breast using whole-breast tangent fields. This was delivered using a 3-D conformal technique. The patient then received a boost to the seroma. This delivered an additional 10 Gy in 5 fractions using a 3-D technique. The total dose was 60.4 Gy.    02/15/2018 Genetic Testing   The Common Hereditary Cancer Panel offered by [facility] includes sequencing and/or deletion duplication testing of the following 47 genes: APC, ATM, AXIN2, BARD1, BMPR1A, BRCA1, BRCA2, BRIP1, CDH1, CDKN2A (p14ARF), CDKN2A (p16INK4a), CKD4, CHEK2, CTNNA1, DICER1, EPCAM (Deletion/duplication testing only), GREM1 (promoter region deletion/duplication testing only), KIT, MEN1, MLH1, MSH2, MSH3, MSH6, MUTYH, NBN, NF1, NHTL1, PALB2, PDGFRA, PMS2, POLD1, POLE, PTEN, RAD50, RAD51C, RAD51D, SDHB, SDHC, SDHD, SMAD4, SMARCA4. STK11, TP53, TSC1, TSC2, and VHL.  The following genes were evaluated for sequence changes only: SDHA and HOXB13 c.251G>A variant only.   Results: POSITIVE. 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 The date of this test report is 02/15/2018   10/20/2019 PET scan   Bone mets involving Right hip, Bil Iliac bones, T8, T9, L2 and L3, Rt prox humerus, Righ glenoid. Some lytic and some sclerotic. Mild Left supraclav adenopathy, 2 small lesions basal ganglia (could be lacunar infarcts)   11/14/2019 - 12/26/2019 Chemotherapy   The patient had palonosetron (ALOXI) injection 0.25 mg, 0.25 mg, Intravenous,  Once, 2 of 5 cycles Administration: 0.25 mg (12/05/2019), 0.25 mg (12/26/2019) ado-trastuzumab emtansine (KADCYLA) 260 mg in sodium chloride 0.9 % 250 mL chemo infusion, 3.6 mg/kg = 260 mg, Intravenous, Once, 3 of 6 cycles Administration: 260 mg (11/14/2019), 260 mg (12/05/2019), 260 mg (12/26/2019)  for chemotherapy treatment.    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  Required TPN briefly.  Patient was able to swallow and keep food down by the time  of discharge.   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 Some lytic and some sclerotic. Mild Left supraclav adenopathy, 2 small lesions basal ganglia (could be lacunar infarcts)   11/14/2019 - 12/26/2019 Chemotherapy   The patient had palonosetron (ALOXI) injection 0.25 mg, 0.25 mg, Intravenous,  Once, 2 of 5 cycles Administration: 0.25 mg (12/05/2019), 0.25 mg (12/26/2019) ado-trastuzumab emtansine (KADCYLA) 260 mg in sodium chloride 0.9 % 250 mL chemo infusion, 3.6 mg/kg = 260 mg, Intravenous, Once, 3 of 6 cycles Administration: 260 mg (11/14/2019), 260 mg (12/05/2019), 260 mg (12/26/2019)  for chemotherapy treatment.      CHIEF COMPLIANT: Follow-upof metastatic breast cancer  INTERVAL HISTORY: Erin [name] is a 60 y.o. with above-mentioned history of metastatic breast cancer who is currently on talazopariband Xgeva.She presents to the clinic todayfor a toxicity check.  Her major complaint is nosebleeds.  She is otherwise tolerating it fairly well.  She could not tolerate oral iron pills because they were causing her nausea and vomiting.  She discontinued them.  She has plenty of energy and feels well today normal.  ALLERGIES:  has No Known Allergies.  MEDICATIONS:  Current Outpatient Medications  Medication Sig Dispense Refill  . Accu-Chek FastClix Lancets MISC Use as instructed. Inject into the skin twice daily 100 each 3  . atorvastatin (LIPITOR) 40 MG tablet Take 1 tablet (40 mg total) by mouth daily. 90 tablet 1  . Blood Glucose Calibration (ACCU-CHEK GUIDE CONTROL) LIQD 1 each by In Vitro route once as needed for up to 1 dose. 1 each 0  . cholecalciferol (VITAMIN D) 1000  units tablet Take 1,000 Units by mouth daily.    Marland Kitchen glimepiride (AMARYL) 2 MG tablet TAKE 1 TABLET (2 MG TOTAL) BY MOUTH DAILY WITH BREAKFAST. TO LOWER BLOOD SUGAR 90 tablet 1  . glucose blood (ACCU-CHEK GUIDE) test strip Use as instructed. Check blood glucose by fingerstick twice per day. 100 each 12  . Lancet Devices (ACCU-CHEK SOFTCLIX) lancets Use as instructed 1 each 0  . lidocaine-prilocaine (EMLA) cream Apply 1 application topically as needed. 30 g 5  . metFORMIN (GLUCOPHAGE) 500 MG tablet TAKE 2 TABLETS BY MOUTH 2 (TWO) TIMES DAILY WITH A MEAL. 360 tablet 1  . talazoparib tosylate (TALZENNA) 0.25 MG capsule Take 2 capsules (0.5 mg total) by mouth daily. 90 capsule 3   No current facility-administered medications for this visit.   Facility-Administered Medications Ordered in Other Visits  Medication Dose Route Frequency Provider Last Rate Last Admin  . heparin lock flush 100 unit/mL  500 Units Intracatheter Once PRN [name], MD      . sodium chloride flush (NS) 0.9 % injection 10 mL  10 mL Intracatheter PRN [name], MD        PHYSICAL EXAMINATION: ECOG PERFORMANCE STATUS: 1 - Symptomatic but completely ambulatory  Vitals:   11/14/20 0849  BP: 112/61  Pulse: 93  Resp: 20  Temp: 97.9 F (36.6 C)  SpO2: 100%   Filed Weights   11/14/20  0849  Weight: 143 lb 1.6 oz (64.9 kg)    LABORATORY DATA:  I have reviewed the data as listed CMP Latest Ref Rng & Units 11/14/2020 09/18/2020 08/26/2020  Glucose 70 - 99 mg/dL 91 101(H) 77  BUN 6 - 20 mg/dL _0 Creatinine 0.44 - 1.00 mg/dL 0.67 0.71 0.70  Sodium 135 - 145 mmol/L 138 142 139  Potassium 3.5 - 5.1 mmol/L 3.9 3.8 3.7  Chloride 98 - 111 mmol/L 106 107 108  CO2 22 - 32 mmol/L _1 Calcium 8.9 - 10.3 mg/dL 8.8(L) 9.4 9.2  Total Protein 6.5 - 8.1 g/dL 7.4 7.4 7.4  Total Bilirubin 0.3 - 1.2 mg/dL <0.2(L) 0.3 0.5  Alkaline Phos 38 - 126 U/L 67 68 70  AST 15 - 41 U/L _2 ALT 0 - 44 U/L _3 Lab  Results  Component Value Date   WBC 4.6 11/14/2020   HGB 9.8 (L) 11/14/2020   HCT 29.8 (L) 11/14/2020   MCV 104.9 (H) 11/14/2020   PLT 63 (L) 11/14/2020   NEUTROABS 3.1 11/14/2020    ASSESSMENT & PLAN:  Malignant neoplasm of lower-outer quadrant of left breast of female, estrogen receptor negative (HCC) Left lumpectomy: IDC grade 3, 1.2 cm, DCIS is present, margins negative, 0/6 lymph nodes negative, ER 0%, PR 0%, HER-2 positive ratio 2.28, Ki-67 20%, T1 BN 0 stage IA S/P Taxol-Herceptin BRCA 2 Mutation Positive  10/20/19:Bone mets involving Right hip, Bil Iliac bones, T8, T9, L2 and L3, Rt prox humerus, Righ glenoid. Some lytic and some sclerotic. Mild Left supraclav adenopathy, 2 small lesions basal ganglia (could be lacunar infarcts)  Biopsy the supra clav LN: 10/31/2019: Metastatic breast cancer, ER/PR negative HER-2 equivocal by IHC, FISHpositive Treatmentsummary: Kadcyla every 3-week treatment.Discontinued for progression  MRI of the back 01/07/2020: Extensive metastatic disease to the involved spine. Tumor replacement of L3 vertebral body with pathologic compression fracture, epidural and extraosseous extension. Tumor extending throughout L3 foramen Status post palliative radiation therapyCausing severe dysphagia and dehydration and hospitalization5/17/2021-02/03/2020 ------------------------------------------------------------------------------------------------------------------------------------------------------ Echocardiogram 11/14/2019 Current treatment:Talazoparib started 02/07/2020   Toxicities: Denies any nausea or vomiting Monitoring closely for cytopenias. Anemia: Hemoglobinimproved to 9.8  Thrombocytopenia:Platelets are63 today.  We decided to watch and monitor this.  Currentdosage of talazoparib to0.5 mgstarted 09/20/2020 CT CAP 09/20/19: No evidence of ST mets, tiny inferior right hepatic lesion substantially decreased(3 mm), bone mets have become  sclerotic suggest healing  Return to clinic in 6 weeks with labs, scans  and follow-up .    Orders Placed This Encounter  Procedures  . CT CHEST ABDOMEN PELVIS W CONTRAST    Standing Status:   Future    Standing Expiration Date:   11/14/2021    Order Specific Question:   If indicated for the ordered procedure, I authorize the administration of contrast media per Radiology protocol    Answer:   Yes    Order Specific Question:   Is patient pregnant?    Answer:   No    Order Specific Question:   Preferred imaging location?    Answer:   [facility]    Order Specific Question:   Release to patient    Answer:   Immediate    Order Specific Question:   Is Oral Contrast requested for this exam?    Answer:   Yes, Per Radiology protocol    Order Specific Question:   Reason for Exam (SYMPTOM  OR DIAGNOSIS REQUIRED)  Answer:   Met breast cancer restaging   The patient has a good understanding of the overall plan. she agrees with it. she will call with any problems that may develop before the next visit here.  Total time spent: 30 mins including face to face time and time spent for planning, charting and coordination of care  Rulon Eisenmenger, MD, MPH 11/14/2020  I, Cloyde Reams Dorshimer, am acting as scribe for [name].  I have reviewed the above documentation for accuracy and completeness, and I agree with the above.

## 2020-11-18 ENCOUNTER — Other Ambulatory Visit: Payer: Self-pay

## 2020-11-18 ENCOUNTER — Ambulatory Visit: Payer: Medicaid Other | Attending: Nurse Practitioner | Admitting: Nurse Practitioner

## 2020-11-18 DIAGNOSIS — E1165 Type 2 diabetes mellitus with hyperglycemia: Secondary | ICD-10-CM

## 2020-11-18 DIAGNOSIS — E119 Type 2 diabetes mellitus without complications: Secondary | ICD-10-CM

## 2020-11-18 MED ORDER — GLIMEPIRIDE 2 MG PO TABS: 2.0000 mg | ORAL_TABLET | Freq: Every day | ORAL | 1 refills | Status: DC

## 2020-11-18 MED ORDER — ACCU-CHEK GUIDE VI STRP: ORAL_STRIP | 12 refills | Status: AC

## 2020-11-18 MED ORDER — ACCU-CHEK FASTCLIX LANCETS MISC: 3 refills | Status: DC

## 2020-11-18 NOTE — Progress Notes (Signed)
Virtual Visit via Telephone Note Due to national recommendations of social distancing due to [place] 19, telehealth visit is felt to be most appropriate for this patient at this time.  I discussed the limitations, risks, security and privacy concerns of performing an evaluation and management service by telephone and the availability of in person appointments. I also discussed with the patient that there may be a patient responsible charge related to this service. The patient expressed understanding and agreed to proceed.    I connected with Erin [name] on 11/18/20  at  11:10 AM EST  EDT by telephone and verified that I am speaking with the correct person using two identifiers.   Consent I discussed the limitations, risks, security and privacy concerns of performing an evaluation and management service by telephone and the availability of in person appointments. I also discussed with the patient that there may be a patient responsible charge related to this service. The patient expressed understanding and agreed to proceed.   Location of Patient: Private Residence   Location of Provider: [place] [facility] [employer] Office    Persons participating in Telemedicine visit: Erin Rankins FNP-BC [place] INTERPRETER VIA PHONE   History of Present Illness: Telemedicine visit for: Follow up She has a past medical history of Arthritis, Borderline glaucoma of right eye, BRCA2 gene mutation positive (02/17/2018), Chronic back pain, Deaf, Depression, Hyperlipidemia, Hypertension, IDA, Malignant neoplasm of lower-outer quadrant of left breast of female, estrogen receptor negative 05-2017, DM2  Patient has been counseled on age-appropriate routine health concerns for screening and prevention. These are reviewed and up-to-date. Referrals have been placed accordingly. Immunizations are up-to-date or declined.    MAMMOGRAM: CT chest and abdomen pending. This is being ordered  by Oncology. PAP: Hysterectomy COLONOSCOPY: 2019 repeat 2024  DM2 She is taking glimeperide 45m in the am and only metformin 2 tablets in the pm instead of BID. Average fasting readings 80-100s.  LDL at goal with atorvastatin 40 mg daily.  Lab Results  Component Value Date   HGBA1C 5.9 (H) 01/31/2020   Lab Results  Component Value Date   LDLCALC 64 11/19/2020   Experiencing nose bleeds periodically. States she has spoken with oncologist and will discuss further at next office visit.   Past Medical History:  Diagnosis Date  . Arthritis    knees, elbows  . Borderline glaucoma of right eye   . BRCA2 gene mutation positive 02/17/2018   BRCA2 c.4552del (p.Glu1518Asnfs*25)  Result reported out on 02/15/2018.   . Breast cancer ([place] 05/2017   left  . Chronic back pain   . Deaf    per pt born hearing and at age 8911/4 lost hearing , was told by mother unknown cause, can miminally hear in left and no hearing on right   . Depression   . Dyspnea    states some SOB with ADLs and anemia   . Elevated cancer antigen 125 (CA 125)   . Family history of breast cancer   . Family history of breast cancer   . Family history of colon cancer   . Genetic testing 02/17/2018   The Common Hereditary Cancer Panel offered by [facility] includes sequencing and/or deletion duplication testing of the following 47 genes: APC, ATM, AXIN2, BARD1, BMPR1A, BRCA1, BRCA2, BRIP1, CDH1, CDKN2A (p14ARF), CDKN2A (p16INK4a), CKD4, CHEK2, CTNNA1, DICER1, EPCAM (Deletion/duplication testing only), GREM1 (promoter region deletion/duplication testing only), KIT, MEN1, MLH1, MSH2, MSH3, MSH6, MU  . History of  cancer chemotherapy    left breast cancer 09-08-2017 to 10-09-2017;  fallopion tube cancer  12-15-2017  to 06-28-2018  . 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 History of external beam radiation therapy    left breast  11-25-2017  to 01-07-2018  . Hyperlipidemia   . Hypertension    followed by pcp   (10-05-2019  per pt never had stress test)  . IDA (iron deficiency anemia)   . Malignant neoplasm of lower-outer quadrant of left breast of female, estrogen receptor negative [facility]) oncologist-- dr [name]   dx 08/ 2018--- Stage IA, DCIS,  ER/ PR negative,  HER-2 positive;  06-09-2017 s/p left breast lumpectomy with node dissection;  completed chemo 10-09-2017  and radiation 01-07-2018/  hercepton completed 06-28-2018  . Non-insulin dependent type 2 diabetes mellitus ([place])    followed by pcp   (10-05-2019 per pt check cbg every other day in AM,  fasting cbg-- 105)  . Numbness of right thumb   . Wears glasses     Past Surgical History:  Procedure Laterality Date  . BREAST LUMPECTOMY WITH RADIOACTIVE SEED AND SENTINEL LYMPH NODE BIOPSY Left 06/09/2017   Procedure: BREAST LUMPECTOMY WITH RADIOACTIVE SEED AND SENTINEL LYMPH NODE BIOPSY;  Surgeon: Excell Seltzer, MD;  Location: [facility];  Service: General;  Laterality: Left;  . COLONOSCOPY  11/02/2017   polyps  . HERNIA REPAIR    . HIATAL HERNIA REPAIR  09/2019  . LAPAROSCOPIC ASSISTED VAGINAL HYSTERECTOMY N/A 11/04/2017   Procedure: LAPAROSCOPIC ASSISTED VAGINAL HYSTERECTOMY;  Surgeon: [name], MD;  Location: [facility];  Service: Gynecology;  Laterality: N/A;  . LAPAROSCOPIC BILATERAL SALPINGO OOPHERECTOMY Bilateral 11/04/2017   Procedure: LAPAROSCOPIC BILATERAL SALPINGO OOPHORECTOMY;  Surgeon: [name], MD;  Location: [facility];  Service: Gynecology;  Laterality: Bilateral;  . LAPAROSCOPY N/A 10/09/2019   Procedure: LAPAROSCOPY DIAGNOSTIC WITH PERITONEAL BIOPSIES;  Surgeon: [name], MD;  Location: [facility];  Service: Gynecology;  Laterality: N/A;  . PORTACATH PLACEMENT Right 06/09/2017   Procedure: INSERTION PORT-A-CATH WITH [place];  Surgeon: Excell Seltzer, MD;  Location: [facility];  Service: General;  Laterality: Right;  . PORTACATH PLACEMENT Right 11/13/2019   Procedure: INSERTION PORT-A-CATH WITH ULTRASOUND GUIDANCE;  Surgeon: Rolm Bookbinder, MD;  Location: [facility];  Service: General;  Laterality: Right;  . TUBAL LIGATION  02/02/2002   @[facility]    PPTL  . UPPER GI ENDOSCOPY      Family History  Problem Relation Age of Onset  . Hypertension Mother   . Lupus Mother   . Diabetes Father   . Hypertension Sister   . Diabetes Paternal Grandmother   . Colon cancer Paternal Grandmother 78  . CAD Brother   . Diabetes Brother   . Heart attack Brother   . Breast cancer Maternal Aunt        dx >50  . Heart attack Paternal Grandfather   . Breast cancer Maternal Aunt        dx under 21  . Breast cancer Maternal Aunt        dx  under 86    Social History   Socioeconomic History  . Marital status: Legally Separated    Spouse name: Not on file  . Number of children: 2  .  Years of education: Not on file  . Highest education level: Not on file  Occupational History  . Not on file  Tobacco Use  . Smoking status: Never Smoker  . Smokeless tobacco: Never Used  Vaping Use  . Vaping Use: Never used  Substance and Sexual Activity  . Alcohol use: No  . Drug use: Never  . Sexual activity: Not on file  Other Topics Concern  . Not on file  Social History Narrative   1 boy and 1 girl   Resides in [place]   Seperated   Social Determinants of Health   Financial Resource Strain: Not on file  Food Insecurity: Not on file  Transportation Needs: Not on file  Physical Activity: Not on file  Stress: Not on file  Social Connections: Not on file     Observations/Objective: Awake, alert and oriented x 3   Review of Systems  Constitutional: Negative for fever, malaise/fatigue and weight loss.  HENT: Positive for hearing loss. Negative for nosebleeds.   Eyes: Negative.  Negative for blurred vision,  double vision and photophobia.  Respiratory: Negative.  Negative for cough and shortness of breath.   Cardiovascular: Negative.  Negative for chest pain, palpitations and leg swelling.  Gastrointestinal: Negative.  Negative for heartburn, nausea and vomiting.  Musculoskeletal: Negative.  Negative for myalgias.  Neurological: Negative.  Negative for dizziness, focal weakness, seizures and headaches.  Psychiatric/Behavioral: Negative.  Negative for suicidal ideas.    Assessment and Plan: Aissata was seen today for diabetes.  Diagnoses and all orders for this visit:  Type 2 diabetes mellitus without complication, without long-term current use of insulin (HCC) -     glucose blood (ACCU-CHEK GUIDE) test strip; Use as instructed. Check blood glucose by fingerstick twice per day. -     glimepiride (AMARYL) 2 MG tablet; Take 1 tablet (2 mg total) by mouth daily with breakfast. To lower blood sugar -     Accu-Chek FastClix Lancets MISC; Use as instructed. Inject into the skin twice daily Continue blood sugar control as discussed in office today, low carbohydrate diet, and regular physical exercise as tolerated, 150 minutes per week (30 min each day, 5 days per week, or 50 min 3 days per week). Keep blood sugar logs with fasting goal of 90-130 mg/dl, post prandial (after you eat) less than 180.  For Hypoglycemia: BS <60 and Hyperglycemia BS >400; contact the clinic ASAP. Annual eye exams and foot exams are recommended.    Follow Up Instructions Return in about 8 weeks (around 01/13/2021).     I discussed the assessment and treatment plan with the patient. The patient was provided an opportunity to ask questions and all were answered. The patient agreed with the plan and demonstrated an understanding of the instructions.   The patient was advised to call back or seek an in-person evaluation if the symptoms worsen or if the condition fails to improve as anticipated.  I provided 12 minutes of  non-face-to-face time during this encounter including median intraservice time, reviewing previous notes, labs, imaging, medications and explaining diagnosis and management.  [name], FNP-BC

## 2020-11-19 ENCOUNTER — Ambulatory Visit: Payer: Medicaid Other | Attending: Family Medicine

## 2020-11-19 ENCOUNTER — Other Ambulatory Visit: Payer: Self-pay | Admitting: Nurse Practitioner

## 2020-11-19 ENCOUNTER — Other Ambulatory Visit: Payer: Self-pay

## 2020-11-19 DIAGNOSIS — E559 Vitamin D deficiency, unspecified: Secondary | ICD-10-CM

## 2020-11-19 DIAGNOSIS — E1169 Type 2 diabetes mellitus with other specified complication: Secondary | ICD-10-CM

## 2020-11-19 DIAGNOSIS — E785 Hyperlipidemia, unspecified: Secondary | ICD-10-CM | POA: Diagnosis not present

## 2020-11-20 LAB — LIPID PANEL
Chol/HDL Ratio: 2.9 ratio (ref 0.0–4.4)
Cholesterol, Total: 130 mg/dL (ref 100–199)
HDL: 45 mg/dL (ref >39)
LDL Chol Calc (NIH): 64 mg/dL (ref 0–99)
Triglycerides: 113 mg/dL (ref 0–149)
VLDL Cholesterol Cal: 21 mg/dL (ref 5–40)

## 2020-11-20 LAB — HEMOGLOBIN A1C
Est. average glucose Bld gHb Est-mCnc: 103 mg/dL
Hgb A1c MFr Bld: 5.2 % (ref 4.8–5.6)

## 2020-11-20 LAB — VITAMIN D 25 HYDROXY (VIT D DEFICIENCY, FRACTURES): Vit D, 25-Hydroxy: 30.8 ng/mL (ref 30.0–100.0)

## 2020-11-20 LAB — MICROALBUMIN / CREATININE URINE RATIO
Creatinine, Urine: 73.1 mg/dL
Microalb/Creat Ratio: <4 mg/g creat (ref 0–29)
Microalbumin, Urine: <3 ug/mL

## 2020-11-24 ENCOUNTER — Encounter: Payer: Self-pay | Admitting: Nurse Practitioner

## 2020-12-12 ENCOUNTER — Other Ambulatory Visit (HOSPITAL_COMMUNITY): Payer: Self-pay

## 2020-12-13 DIAGNOSIS — Z419 Encounter for procedure for purposes other than remedying health state, unspecified: Secondary | ICD-10-CM | POA: Diagnosis not present

## 2020-12-16 ENCOUNTER — Telehealth: Payer: Self-pay | Admitting: *Deleted

## 2020-12-16 NOTE — Telephone Encounter (Signed)
Received PA approval from PA team for CT CAP.  Apt scheduled.  Attempt x 1 to contact pt with apt date and time.  No answer, LVM to return call to the office.

## 2020-12-19 ENCOUNTER — Other Ambulatory Visit (HOSPITAL_COMMUNITY): Payer: Self-pay

## 2020-12-23 ENCOUNTER — Other Ambulatory Visit (HOSPITAL_COMMUNITY): Payer: Self-pay

## 2020-12-25 ENCOUNTER — Inpatient Hospital Stay: Payer: Medicaid Other | Attending: Gynecologic Oncology

## 2020-12-25 ENCOUNTER — Ambulatory Visit (HOSPITAL_COMMUNITY)
Admission: RE | Admit: 2020-12-25 | Discharge: 2020-12-25 | Disposition: A | Discharge status: Home / Self Care | Payer: Medicaid Other | Source: Ambulatory Visit | Attending: Hematology and Oncology | Admitting: Hematology and Oncology

## 2020-12-25 ENCOUNTER — Inpatient Hospital Stay: Payer: Medicaid Other

## 2020-12-25 ENCOUNTER — Other Ambulatory Visit (HOSPITAL_COMMUNITY): Payer: Self-pay

## 2020-12-25 ENCOUNTER — Encounter (HOSPITAL_COMMUNITY): Payer: Self-pay

## 2020-12-25 ENCOUNTER — Other Ambulatory Visit: Payer: Self-pay

## 2020-12-25 DIAGNOSIS — C7951 Secondary malignant neoplasm of bone: Secondary | ICD-10-CM | POA: Diagnosis not present

## 2020-12-25 DIAGNOSIS — C50919 Malignant neoplasm of unspecified site of unspecified female breast: Secondary | ICD-10-CM | POA: Diagnosis not present

## 2020-12-25 DIAGNOSIS — D649 Anemia, unspecified: Secondary | ICD-10-CM | POA: Diagnosis not present

## 2020-12-25 DIAGNOSIS — R110 Nausea: Secondary | ICD-10-CM | POA: Diagnosis not present

## 2020-12-25 DIAGNOSIS — C50512 Malignant neoplasm of lower-outer quadrant of left female breast: Secondary | ICD-10-CM | POA: Diagnosis not present

## 2020-12-25 DIAGNOSIS — K449 Diaphragmatic hernia without obstruction or gangrene: Secondary | ICD-10-CM | POA: Diagnosis not present

## 2020-12-25 DIAGNOSIS — K209 Esophagitis, unspecified without bleeding: Secondary | ICD-10-CM | POA: Diagnosis not present

## 2020-12-25 DIAGNOSIS — Z171 Estrogen receptor negative status [ER-]: Secondary | ICD-10-CM | POA: Insufficient documentation

## 2020-12-25 LAB — CBC WITH DIFFERENTIAL (CANCER CENTER ONLY)
Abs Immature Granulocytes: 0.02 10*3/uL (ref 0.00–0.07)
Basophils Absolute: 0 10*3/uL (ref 0.0–0.1)
Basophils Relative: 0 %
Eosinophils Absolute: 0 10*3/uL (ref 0.0–0.5)
Eosinophils Relative: 0 %
HCT: 31.8 % — ABNORMAL LOW (ref 36.0–46.0)
Hemoglobin: 10.4 g/dL — ABNORMAL LOW (ref 12.0–15.0)
Immature Granulocytes: 0 %
Lymphocytes Relative: 17 %
Lymphs Abs: 1.1 10*3/uL (ref 0.7–4.0)
MCH: 34.8 pg — ABNORMAL HIGH (ref 26.0–34.0)
MCHC: 32.7 g/dL (ref 30.0–36.0)
MCV: 106.4 fL — ABNORMAL HIGH (ref 80.0–100.0)
Monocytes Absolute: 0.6 10*3/uL (ref 0.1–1.0)
Monocytes Relative: 10 %
Neutro Abs: 4.5 10*3/uL (ref 1.7–7.7)
Neutrophils Relative %: 73 %
Platelet Count: 108 10*3/uL — ABNORMAL LOW (ref 150–400)
RBC: 2.99 MIL/uL — ABNORMAL LOW (ref 3.87–5.11)
RDW: 15.1 % (ref 11.5–15.5)
WBC Count: 6.3 10*3/uL (ref 4.0–10.5)
nRBC: 0 % (ref 0.0–0.2)

## 2020-12-25 LAB — POCT I-STAT CREATININE: Creatinine, Ser: 0.5 mg/dL (ref 0.44–1.00)

## 2020-12-25 MED ORDER — IOHEXOL 300 MG/ML  SOLN
100.0000 mL | Freq: Once | INTRAMUSCULAR | Status: AC | PRN
  Administered 2020-12-25: 100 mL via INTRAVENOUS

## 2020-12-25 MED ORDER — HEPARIN SOD (PORK) LOCK FLUSH 100 UNIT/ML IV SOLN
INTRAVENOUS | Status: AC
  Filled 2020-12-25: qty 5

## 2020-12-25 MED ORDER — HEPARIN SOD (PORK) LOCK FLUSH 100 UNIT/ML IV SOLN
500.0000 [IU] | Freq: Once | INTRAVENOUS | Status: AC
  Administered 2020-12-25: 500 [IU] via INTRAVENOUS

## 2020-12-25 NOTE — Assessment & Plan Note (Signed)
Left lumpectomy: IDC grade 3, 1.2 cm, DCIS is present, margins negative, 0/6 lymph nodes negative, ER 0%, PR 0%, HER-2 positive ratio 2.28, Ki-67 20%, T1 BN 0 stage IA S/P Taxol-Herceptin BRCA 2 Mutation Positive  10/20/19:Bone mets involving Right hip, Bil Iliac bones, T8, T9, L2 and L3, Rt prox humerus, Righ glenoid. Some lytic and some sclerotic. Mild Left supraclav adenopathy, 2 small lesions basal ganglia (could be lacunar infarcts)  Biopsy the supra clav LN: 10/31/2019: Metastatic breast cancer, ER/PR negative HER-2 equivocal by IHC, FISHpositive Treatmentsummary: Kadcyla every 3-week treatment.Discontinued for progression  MRI of the back 01/07/2020: Extensive metastatic disease to the involved spine. Tumor replacement of L3 vertebral body with pathologic compression fracture, epidural and extraosseous extension. Tumor extending throughout L3 foramen Status post palliative radiation therapyCausing severe dysphagia and dehydration and hospitalization5/17/2021-02/03/2020 ------------------------------------------------------------------------------------------------------------------------------------------------------ Echocardiogram 11/14/2019 Current treatment:Talazoparib started 02/07/2020   Toxicities: Denies any nausea or vomiting Monitoring closely for cytopenias. Anemia: Hemoglobinimproved to 9.8  Thrombocytopenia:Platelets are63 today.  We decided to watch and monitor this.  Currentdosage of talazoparib to0.5 mgstarted 09/20/2020 CT CAP 09/20/19: No evidence of ST mets, tiny inferior right hepatic lesion substantially decreased(3 mm), bone mets have become sclerotic suggest healing CT CAP 12/25/2020:  Additional scans will be done in 3 months. Return to clinic in 6 weeks with labs,   and follow-up

## 2020-12-25 NOTE — Progress Notes (Signed)
Patient Care Team: [name], NP as PCP - General (Nurse Practitioner) Excell Seltzer, MD (Inactive) as Consulting Physician (General Surgery) [name], MD as Consulting Physician (Hematology and Oncology) Kyung Rudd, MD as Consulting Physician (Radiation Oncology) [name], MD as Consulting Physician (Gynecologic Oncology) Delice Bison, Charlestine Massed, NP as Nurse Practitioner (Hematology and Oncology) Mauro Kaufmann, RN as Oncology Nurse Navigator Rockwell Germany, RN as Oncology Nurse Navigator  DIAGNOSIS:    ICD-10-CM   1. Malignant neoplasm of lower-outer quadrant of left breast of female, estrogen receptor negative ([place])  C50.512    Z17.1     SUMMARY OF ONCOLOGIC HISTORY: Oncology History  Malignant neoplasm of lower-outer quadrant of left breast of female, estrogen receptor negative ([place])  05/11/2017 Initial Diagnosis   Left breast biopsy 3:30 position 6 cm from nipple: IDC with DCIS, lymphovascular invasion present, grade 2-3, ER 0%, PR 0%, HER-2 positive ratio 2.28, Ki-67 20%, 1 cm lesion left breast, T1b N0 stage IA clinical stage   06/09/2017 Surgery   Left lumpectomy: IDC grade 3, 1.2 cm, DCIS is present, margins negative, 0/6 lymph nodes negative, ER 0%, PR 0%, HER-2 positive ratio 2.28, Ki-67 20%, T1 BN 0 stage IA   07/09/2017 - 09/29/2017 Adjuvant Chemotherapy   Taxol/Herceptin x 12 completed on 09/29/2017, went on to receive every 3 week adjuvant Herceptin through 06/28/2018   11/25/2017 - 01/07/2018 Radiation Therapy    The patient initially received a dose of 50.4 Gy in 28 fractions to the breast using whole-breast tangent fields. This was delivered using a 3-D conformal technique. The patient then received a boost to the seroma. This delivered an additional 10 Gy in 5 fractions using a 3-D technique. The total dose was 60.4 Gy.   02/15/2018 Genetic Testing   The Common Hereditary Cancer Panel offered by [facility] includes sequencing and/or deletion  duplication testing of the following 47 genes: APC, ATM, AXIN2, BARD1, BMPR1A, BRCA1, BRCA2, BRIP1, CDH1, CDKN2A (p14ARF), CDKN2A (p16INK4a), CKD4, CHEK2, CTNNA1, DICER1, EPCAM (Deletion/duplication testing only), GREM1 (promoter region deletion/duplication testing only), KIT, MEN1, MLH1, MSH2, MSH3, MSH6, MUTYH, NBN, NF1, NHTL1, PALB2, PDGFRA, PMS2, POLD1, POLE, PTEN, RAD50, RAD51C, RAD51D, SDHB, SDHC, SDHD, SMAD4, SMARCA4. STK11, TP53, TSC1, TSC2, and VHL.  The following genes were evaluated for sequence changes only: SDHA and HOXB13 c.251G>A variant only.   Results: POSITIVE. 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 The date of this test report is 02/15/2018   10/20/2019 PET scan   Bone mets involving Right hip, Bil Iliac bones, T8, T9, L2 and L3, Rt prox humerus, Righ glenoid. Some lytic and some sclerotic. Mild Left supraclav adenopathy, 2 small lesions basal ganglia (could be lacunar infarcts)   11/14/2019 - 12/26/2019 Chemotherapy   The patient had palonosetron (ALOXI) injection 0.25 mg, 0.25 mg, Intravenous,  Once, 2 of 5 cycles Administration: 0.25 mg (12/05/2019), 0.25 mg (12/26/2019) ado-trastuzumab emtansine (KADCYLA) 260 mg in sodium chloride 0.9 % 250 mL chemo infusion, 3.6 mg/kg = 260 mg, Intravenous, Once, 3 of 6 cycles Administration: 260 mg (11/14/2019), 260 mg (12/05/2019), 260 mg (12/26/2019)  for chemotherapy treatment.    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  Required TPN briefly.  Patient was able to swallow and keep food down by the time of discharge.   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 Some lytic and some sclerotic. Mild Left supraclav adenopathy, 2 small lesions basal ganglia (could be lacunar infarcts)   11/14/2019 - 12/26/2019 Chemotherapy   The patient had palonosetron (ALOXI) injection 0.25 mg, 0.25 mg, Intravenous,  Once, 2 of 5 cycles Administration: 0.25 mg (12/05/2019), 0.25 mg (12/26/2019) ado-trastuzumab emtansine (KADCYLA) 260 mg in sodium chloride 0.9 % 250 mL chemo infusion, 3.6 mg/kg = 260 mg, Intravenous, Once, 3 of 6 cycles Administration: 260 mg (11/14/2019), 260 mg (12/05/2019), 260 mg (12/26/2019)  for chemotherapy treatment.      CHIEF COMPLIANT: Follow-upof metastatic breast cancer  INTERVAL HISTORY: Erin [name] is a 60 y.o. with above-mentioned history of metastatic breast cancer who is currently on talazopariband Xgeva.She presents to the clinic todayfora toxicity check and to review her scan.  She is tolerating the lower dosage of the talazoparib very well.  She has intermittent nausea and has thrown up once or twice.  She is complaining of esophagitis and sometimes food gets stuck in the esophagus does not happen too often.  ALLERGIES:  has No Known Allergies.  MEDICATIONS:  Current Outpatient Medications  Medication Sig Dispense Refill  . Accu-Chek FastClix Lancets MISC Use as instructed. Inject into the skin twice daily 100 each 3  . atorvastatin (LIPITOR) 40 MG tablet Take 1 tablet (40 mg total) by mouth daily. 90 tablet 1  . Blood Glucose Calibration (ACCU-CHEK GUIDE CONTROL) LIQD 1 each by In Vitro route once as needed for up to 1 dose. 1 each 0  . cholecalciferol (VITAMIN D) 1000 units tablet Take 1,000 Units by mouth daily.    Marland Kitchen glimepiride (AMARYL) 2 MG tablet Take 1 tablet (2 mg total)  by mouth daily with breakfast. To lower blood sugar 90 tablet 1  . glucose blood (ACCU-CHEK GUIDE) test strip Use as instructed. Check blood glucose by fingerstick twice per day. 100 each 12  . Lancet Devices (ACCU-CHEK SOFTCLIX) lancets Use as instructed 1 each 0  . lidocaine-prilocaine (EMLA) cream Apply 1 application topically as needed. 30 g 5  . metFORMIN (GLUCOPHAGE) 500 MG tablet TAKE 2 TABLETS BY MOUTH 2 (TWO) TIMES DAILY WITH A MEAL. 360 tablet 1  . talazoparib tosylate (TALZENNA) 0.25 MG capsule Take 2 capsules (0.5 mg total) by mouth daily. 90 capsule 3   No current facility-administered medications for this visit.   Facility-Administered Medications Ordered in Other Visits  Medication Dose Route Frequency Provider Last Rate Last Admin  . heparin lock flush 100 unit/mL  500 Units Intracatheter Once PRN [name], MD      . sodium chloride flush (NS) 0.9 % injection 10 mL  10 mL Intracatheter PRN [name], MD        PHYSICAL EXAMINATION: ECOG PERFORMANCE STATUS: 1 - Symptomatic but completely ambulatory  Vitals:   12/26/20 0853  BP: 121/66  Pulse: 89  Resp: 18  Temp: 97.7 F (36.5 C)  SpO2: 100%   Filed Weights   12/26/20 0853  Weight: 143 lb 1.6 oz (64.9 kg)    LABORATORY DATA:  I have  reviewed the data as listed CMP Latest Ref Rng & Units 12/25/2020 11/14/2020 09/18/2020  Glucose 70 - 99 mg/dL - 91 101(H)  BUN 6 - 20 mg/dL - 9 9  Creatinine 0.44 - 1.00 mg/dL 0.50 0.67 0.71  Sodium 135 - 145 mmol/L - 138 142  Potassium 3.5 - 5.1 mmol/L - 3.9 3.8  Chloride 98 - 111 mmol/L - 106 107  CO2 22 - 32 mmol/L - 22 26  Calcium 8.9 - 10.3 mg/dL - 8.8(L) 9.4  Total Protein 6.5 - 8.1 g/dL - 7.4 7.4  Total Bilirubin 0.3 - 1.2 mg/dL - <0.2(L) 0.3  Alkaline Phos 38 - 126 U/L - 67 68  AST 15 - 41 U/L - 18 18  ALT 0 - 44 U/L - 18 18    Lab Results  Component Value Date   WBC 6.3 12/25/2020   HGB 10.4 (L) 12/25/2020   HCT 31.8 (L) 12/25/2020   MCV 106.4 (H)  12/25/2020   PLT 108 (L) 12/25/2020   NEUTROABS 4.5 12/25/2020    ASSESSMENT & PLAN:  Malignant neoplasm of lower-outer quadrant of left breast of female, estrogen receptor negative ([facility]) Left lumpectomy: IDC grade 3, 1.2 cm, DCIS is present, margins negative, 0/6 lymph nodes negative, ER 0%, PR 0%, HER-2 positive ratio 2.28, Ki-67 20%, T1 BN 0 stage IA S/P Taxol-Herceptin BRCA 2 Mutation Positive  10/20/19:Bone mets involving Right hip, Bil Iliac bones, T8, T9, L2 and L3, Rt prox humerus, Righ glenoid. Some lytic and some sclerotic. Mild Left supraclav adenopathy, 2 small lesions basal ganglia (could be lacunar infarcts)  Biopsy the supra clav LN: 10/31/2019: Metastatic breast cancer, ER/PR negative HER-2 equivocal by IHC, FISHpositive Treatmentsummary: Kadcyla every 3-week treatment.Discontinued for progression  MRI of the back 01/07/2020: Extensive metastatic disease to the involved spine. Tumor replacement of L3 vertebral body with pathologic compression fracture, epidural and extraosseous extension. Tumor extending throughout L3 foramen Status post palliative radiation therapyCausing severe dysphagia and dehydration and hospitalization5/17/2021-02/03/2020 ------------------------------------------------------------------------------------------------------------------------------------------------------ Echocardiogram 11/14/2019 Current treatment:Talazoparib started 02/07/2020   Toxicities: Occasional mild nausea Esophagitis: I discussed with her if it is significant we can refer her to gastroenterology.  She will watch and monitor for now.  She will avoid eating late at night. Monitoring closely for cytopenias. Anemia: Hemoglobinimproved to  10.4 Thrombocytopenia:Platelets are 108 today.  Currentdosage of talazoparib to0.5 mgstarted 09/20/2020 CT CAP 09/20/19: No evidence of ST mets, tiny inferior right hepatic lesion substantially decreased(3 mm), bone mets have become  sclerotic suggest healing CT CAP 12/25/2020: To my review there does not seem to be any worsening or progression of disease.  We are waiting for the official radiology reading.  Return to clinic in 2 months with labs injection and follow-up    No orders of the defined types were placed in this encounter.  The patient has a good understanding of the overall plan. she agrees with it. she will call with any problems that may develop before the next visit here.  Total time spent: 30 mins including face to face time and time spent for planning, charting and coordination of care  Rulon Eisenmenger, MD, MPH 12/26/2020  I, Molly Dorshimer, am acting as scribe for [name].  I have reviewed the above documentation for accuracy and completeness, and I agree with the above.

## 2020-12-25 NOTE — Patient Instructions (Signed)

## 2020-12-26 ENCOUNTER — Inpatient Hospital Stay (HOSPITAL_BASED_OUTPATIENT_CLINIC_OR_DEPARTMENT_OTHER): Payer: Medicaid Other | Admitting: Hematology and Oncology

## 2020-12-26 DIAGNOSIS — Z171 Estrogen receptor negative status [ER-]: Secondary | ICD-10-CM

## 2020-12-26 DIAGNOSIS — D649 Anemia, unspecified: Secondary | ICD-10-CM | POA: Diagnosis not present

## 2020-12-26 DIAGNOSIS — C7951 Secondary malignant neoplasm of bone: Secondary | ICD-10-CM | POA: Diagnosis not present

## 2020-12-26 DIAGNOSIS — K209 Esophagitis, unspecified without bleeding: Secondary | ICD-10-CM | POA: Diagnosis not present

## 2020-12-26 DIAGNOSIS — R110 Nausea: Secondary | ICD-10-CM | POA: Diagnosis not present

## 2020-12-26 DIAGNOSIS — C50512 Malignant neoplasm of lower-outer quadrant of left female breast: Secondary | ICD-10-CM | POA: Diagnosis not present

## 2020-12-27 ENCOUNTER — Other Ambulatory Visit (HOSPITAL_COMMUNITY): Payer: Self-pay

## 2021-01-01 ENCOUNTER — Other Ambulatory Visit (HOSPITAL_COMMUNITY): Payer: Self-pay

## 2021-01-01 ENCOUNTER — Telehealth: Payer: Self-pay | Admitting: *Deleted

## 2021-01-01 NOTE — Telephone Encounter (Signed)
Received Vm from pt.  Attempt x1 to return call, no answer.  LVM to return call to the office.

## 2021-01-12 DIAGNOSIS — Z419 Encounter for procedure for purposes other than remedying health state, unspecified: Secondary | ICD-10-CM | POA: Diagnosis not present

## 2021-01-13 ENCOUNTER — Other Ambulatory Visit (HOSPITAL_COMMUNITY): Payer: Self-pay

## 2021-01-13 ENCOUNTER — Other Ambulatory Visit: Payer: Self-pay | Admitting: Hematology and Oncology

## 2021-01-13 DIAGNOSIS — C50512 Malignant neoplasm of lower-outer quadrant of left female breast: Secondary | ICD-10-CM

## 2021-01-13 MED ORDER — TALAZOPARIB TOSYLATE 0.25 MG PO CAPS
0.5000 mg | ORAL_CAPSULE | Freq: Every day | ORAL | 1 refills | Status: DC
  Filled 2021-01-13: qty 60, 30d supply, fill #0
  Filled 2021-01-18 – 2021-02-18 (×2): qty 60, 30d supply, fill #1

## 2021-01-15 ENCOUNTER — Other Ambulatory Visit (HOSPITAL_COMMUNITY): Payer: Self-pay

## 2021-01-16 ENCOUNTER — Other Ambulatory Visit (HOSPITAL_COMMUNITY): Payer: Self-pay

## 2021-01-17 ENCOUNTER — Other Ambulatory Visit (HOSPITAL_COMMUNITY): Payer: Self-pay

## 2021-01-18 ENCOUNTER — Other Ambulatory Visit (HOSPITAL_COMMUNITY): Payer: Self-pay

## 2021-01-21 ENCOUNTER — Other Ambulatory Visit (HOSPITAL_COMMUNITY): Payer: Self-pay

## 2021-01-22 ENCOUNTER — Other Ambulatory Visit (HOSPITAL_COMMUNITY): Payer: Self-pay

## 2021-01-23 ENCOUNTER — Other Ambulatory Visit (HOSPITAL_COMMUNITY): Payer: Self-pay

## 2021-01-24 ENCOUNTER — Other Ambulatory Visit (HOSPITAL_COMMUNITY): Payer: Self-pay

## 2021-02-11 ENCOUNTER — Other Ambulatory Visit (HOSPITAL_COMMUNITY): Payer: Self-pay

## 2021-02-12 DIAGNOSIS — Z419 Encounter for procedure for purposes other than remedying health state, unspecified: Secondary | ICD-10-CM | POA: Diagnosis not present

## 2021-02-14 ENCOUNTER — Other Ambulatory Visit (HOSPITAL_COMMUNITY): Payer: Self-pay

## 2021-02-18 ENCOUNTER — Other Ambulatory Visit (HOSPITAL_COMMUNITY): Payer: Self-pay

## 2021-02-19 ENCOUNTER — Other Ambulatory Visit (HOSPITAL_COMMUNITY): Payer: Self-pay

## 2021-02-24 NOTE — Progress Notes (Signed)
Patient Care Team: [name], NP as PCP - General (Nurse Practitioner) Excell Seltzer, MD (Inactive) as Consulting Physician (General Surgery) [name], MD as Consulting Physician (Hematology and Oncology) Kyung Rudd, MD as Consulting Physician (Radiation Oncology) [name], MD as Consulting Physician (Gynecologic Oncology) Delice Bison, Charlestine Massed, NP as Nurse Practitioner (Hematology and Oncology) Mauro Kaufmann, RN as Oncology Nurse Navigator Rockwell Germany, RN as Oncology Nurse Navigator  DIAGNOSIS:    ICD-10-CM   1. Bone metastases ([facility])  C79.51     2. Fallopian tube cancer, carcinoma, right ([facility])  C57.01     3. Malignant neoplasm of lower-outer quadrant of left breast of female, estrogen receptor negative ([place])  C50.512    Z17.1       SUMMARY OF ONCOLOGIC HISTORY: Oncology History  Malignant neoplasm of lower-outer quadrant of left breast of female, estrogen receptor negative ([place])  05/11/2017 Initial Diagnosis   Left breast biopsy 3:30 position 6 cm from nipple: IDC with DCIS, lymphovascular invasion present, grade 2-3, ER 0%, PR 0%, HER-2 positive ratio 2.28, Ki-67 20%, 1 cm lesion left breast, T1b N0 stage IA clinical stage    06/09/2017 Surgery   Left lumpectomy: IDC grade 3, 1.2 cm, DCIS is present, margins negative, 0/6 lymph nodes negative, ER 0%, PR 0%, HER-2 positive ratio 2.28, Ki-67 20%, T1 BN 0 stage IA    07/09/2017 - 09/29/2017 Adjuvant Chemotherapy   Taxol/Herceptin x 12 completed on 09/29/2017, went on to receive every 3 week adjuvant Herceptin through 06/28/2018    11/25/2017 - 01/07/2018 Radiation Therapy    The patient initially received a dose of 50.4 Gy in 28 fractions to the breast using whole-breast tangent fields. This was delivered using a 3-D conformal technique. The patient then received a boost to the seroma. This delivered an additional 10 Gy in 5 fractions using a 3-D technique. The total dose was 60.4 Gy.    02/15/2018  Genetic Testing   The Common Hereditary Cancer Panel offered by [facility] includes sequencing and/or deletion duplication testing of the following 47 genes: APC, ATM, AXIN2, BARD1, BMPR1A, BRCA1, BRCA2, BRIP1, CDH1, CDKN2A (p14ARF), CDKN2A (p16INK4a), CKD4, CHEK2, CTNNA1, DICER1, EPCAM (Deletion/duplication testing only), GREM1 (promoter region deletion/duplication testing only), KIT, MEN1, MLH1, MSH2, MSH3, MSH6, MUTYH, NBN, NF1, NHTL1, PALB2, PDGFRA, PMS2, POLD1, POLE, PTEN, RAD50, RAD51C, RAD51D, SDHB, SDHC, SDHD, SMAD4, SMARCA4. STK11, TP53, TSC1, TSC2, and VHL.  The following genes were evaluated for sequence changes only: SDHA and HOXB13 c.251G>A variant only.   Results: POSITIVE. 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 The date of this test report is 02/15/2018   10/20/2019 PET scan   Bone mets involving Right hip, Bil Iliac bones, T8, T9, L2 and L3, Rt prox humerus, Righ glenoid. Some lytic and some sclerotic. Mild Left supraclav adenopathy, 2 small lesions basal ganglia (could be lacunar infarcts)   11/14/2019 - 12/26/2019 Chemotherapy   The patient had palonosetron (ALOXI) injection 0.25 mg, 0.25 mg, Intravenous,  Once, 2 of 5 cycles Administration: 0.25 mg (12/05/2019), 0.25 mg (12/26/2019) ado-trastuzumab emtansine (KADCYLA) 260 mg in sodium chloride 0.9 % 250 mL chemo infusion, 3.6 mg/kg = 260 mg, Intravenous, Once, 3 of 6 cycles Administration: 260 mg (11/14/2019), 260 mg (12/05/2019), 260 mg (12/26/2019)   for chemotherapy treatment.     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  Required TPN briefly.  Patient was able to swallow and keep food down by the time of discharge.   02/06/2020 Miscellaneous   Talazoparib   Fallopian tube cancer, carcinoma, right (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 Some lytic and some sclerotic. Mild Left supraclav adenopathy, 2 small lesions basal ganglia (could be lacunar infarcts)   11/14/2019 - 12/26/2019 Chemotherapy   The patient had palonosetron (ALOXI) injection 0.25 mg, 0.25 mg, Intravenous,  Once, 2 of 5 cycles Administration: 0.25 mg (12/05/2019), 0.25 mg (12/26/2019) ado-trastuzumab emtansine (KADCYLA) 260 mg in sodium chloride 0.9 % 250 mL chemo infusion, 3.6 mg/kg = 260 mg, Intravenous, Once, 3 of 6 cycles Administration: 260 mg (11/14/2019), 260 mg (12/05/2019), 260 mg (12/26/2019)   for chemotherapy treatment.       CHIEF COMPLIANT: Follow-up of metastatic breast cancer  INTERVAL HISTORY: Erin [name] is a 60 y.o. with above-mentioned history of metastatic breast cancer who is currently on talazoparib and Xgeva. She presents to the clinic today for a toxicity check.  She reports no new problems or concerns with's operative.  She does feel the mild to moderate fatigue but this is no different than before.  He does not have any nausea or vomiting.  Does not have any bleeding symptoms.  ALLERGIES:  has No Known Allergies.  MEDICATIONS:  Current Outpatient Medications  Medication Sig Dispense Refill   Accu-Chek FastClix Lancets MISC Use as instructed. Inject into the skin twice daily 100 each 3   atorvastatin (LIPITOR) 40 MG tablet Take 1 tablet (40 mg total) by mouth daily. 90 tablet 1   Blood Glucose Calibration (ACCU-CHEK GUIDE CONTROL) LIQD 1 each by In Vitro route once as needed for up to 1 dose. 1 each 0   cholecalciferol (VITAMIN D) 1000 units tablet Take 1,000 Units by mouth  daily.     glimepiride (AMARYL) 2 MG tablet Take 1 tablet (2 mg total) by mouth daily with breakfast. To lower blood sugar 90 tablet 1   glucose blood (ACCU-CHEK GUIDE) test strip Use as instructed. Check blood glucose by fingerstick twice per day. 100 each 12   Lancet Devices (ACCU-CHEK SOFTCLIX) lancets Use as instructed 1 each 0   lidocaine-prilocaine (EMLA) cream Apply 1 application topically as needed. 30 g 5   metFORMIN (GLUCOPHAGE) 500 MG tablet TAKE 2 TABLETS BY MOUTH 2 (TWO) TIMES DAILY WITH A MEAL. 360 tablet 1   talazoparib tosylate (TALZENNA) 0.25 MG capsule Take 2 capsules (0.5 mg total) by mouth daily. 60 capsule 1   No current facility-administered medications for this visit.   Facility-Administered Medications Ordered in Other Visits  Medication Dose Route Frequency Provider Last Rate Last Admin   heparin lock flush 100 unit/mL  500 Units Intracatheter Once PRN [name], MD       sodium chloride flush (NS) 0.9 % injection 10 mL  10 mL Intracatheter PRN [name], MD        PHYSICAL EXAMINATION: ECOG PERFORMANCE STATUS: 1 - Symptomatic but completely ambulatory  Vitals:   02/25/21 0921  BP: 128/65  Pulse: 79  Temp: 97.7  F (36.5 C)  SpO2: 100%   Filed Weights   02/25/21 0921  Weight: 147 lb 4.8 oz (66.8 kg)    LABORATORY DATA:  I have reviewed the data as listed CMP Latest Ref Rng & Units 12/25/2020 11/14/2020 09/18/2020  Glucose 70 - 99 mg/dL - 91 101(H)  BUN 6 - 20 mg/dL - 9 9  Creatinine 0.44 - 1.00 mg/dL 0.50 0.67 0.71  Sodium 135 - 145 mmol/L - 138 142  Potassium 3.5 - 5.1 mmol/L - 3.9 3.8  Chloride 98 - 111 mmol/L - 106 107  CO2 22 - 32 mmol/L - 22 26  Calcium 8.9 - 10.3 mg/dL - 8.8(L) 9.4  Total Protein 6.5 - 8.1 g/dL - 7.4 7.4  Total Bilirubin 0.3 - 1.2 mg/dL - <0.2(L) 0.3  Alkaline Phos 38 - 126 U/L - 67 68  AST 15 - 41 U/L - 18 18  ALT 0 - 44 U/L - 18 18    Lab Results  Component Value Date   WBC 4.8 02/25/2021   HGB 9.7 (L) 02/25/2021    HCT 29.7 (L) 02/25/2021   MCV 107.6 (H) 02/25/2021   PLT 131 (L) 02/25/2021   NEUTROABS 2.9 02/25/2021    ASSESSMENT & PLAN:  Malignant neoplasm of lower-outer quadrant of left breast of female, estrogen receptor negative ([facility]) Left lumpectomy: IDC grade 3, 1.2 cm, DCIS is present, margins negative, 0/6 lymph nodes negative, ER 0%, PR 0%, HER-2 positive ratio 2.28, Ki-67 20%, T1 BN 0 stage IA S/P Taxol-Herceptin BRCA 2 Mutation Positive   10/20/19: Bone mets involving Right hip, Bil Iliac bones, T8, T9, L2 and L3, Rt prox humerus, Righ glenoid. Some lytic and some sclerotic. Mild Left supraclav adenopathy, 2 small lesions basal ganglia (could be lacunar infarcts)   Biopsy the supra clav LN: 10/31/2019: Metastatic breast cancer, ER/PR negative HER-2 equivocal by IHC, FISH positive Treatment summary: Kadcyla every 3-week treatment.  Discontinued for progression   MRI of the back 01/07/2020: Extensive metastatic disease to the involved spine.  Tumor replacement of L3 vertebral body with pathologic compression fracture, epidural and extraosseous extension.  Tumor extending throughout L3 foramen Status post palliative radiation therapy Causing severe dysphagia and dehydration and hospitalization 01/29/2020-02/03/2020 ------------------------------------------------------------------------------------------------------------------------------------------------------ Echocardiogram 11/14/2019 Current treatment: Talazoparib started 02/07/2020   Toxicities: Occasional mild nausea Esophagitis: I discussed with her if it is significant we can refer her to gastroenterology.  She will watch and monitor for now.  She will avoid eating late at night. Monitoring closely for cytopenias. Anemia: Hemoglobin improved to 9.7 Thrombocytopenia: Platelets are 131 today.   Current dosage of talazoparib to 0.5 mg started 09/20/2020 CT CAP 09/20/19: No evidence of ST mets, tiny inferior right hepatic lesion substantially  decreased (3 mm), bone mets have become sclerotic suggest healing CT CAP 12/26/2020: Stable diffuse bone mets     Return to clinic in 3 months with labs injection and follow-up.  Next CT scans will be done in October    No orders of the defined types were placed in this encounter.  The patient has a good understanding of the overall plan. she agrees with it. she will call with any problems that may develop before the next visit here.  Total time spent: 30 mins including face to face time and time spent for planning, charting and coordination of care  Rulon Eisenmenger, MD, MPH 02/25/2021  I, Molly Dorshimer, am acting as scribe for [name].  I have reviewed the above documentation for accuracy  and completeness, and I agree with the above.       

## 2021-02-25 ENCOUNTER — Inpatient Hospital Stay: Payer: Medicaid Other

## 2021-02-25 ENCOUNTER — Other Ambulatory Visit: Payer: Self-pay | Admitting: *Deleted

## 2021-02-25 ENCOUNTER — Other Ambulatory Visit: Payer: Self-pay

## 2021-02-25 ENCOUNTER — Telehealth: Payer: Self-pay | Admitting: Hematology and Oncology

## 2021-02-25 ENCOUNTER — Other Ambulatory Visit: Payer: Self-pay | Admitting: Hematology and Oncology

## 2021-02-25 ENCOUNTER — Inpatient Hospital Stay: Payer: Medicaid Other | Attending: Gynecologic Oncology

## 2021-02-25 ENCOUNTER — Inpatient Hospital Stay (HOSPITAL_BASED_OUTPATIENT_CLINIC_OR_DEPARTMENT_OTHER): Payer: Medicaid Other | Admitting: Hematology and Oncology

## 2021-02-25 VITALS — BP 128/65 | HR 79 | Temp 97.7°F | Wt 147.3 lb

## 2021-02-25 DIAGNOSIS — C5701 Malignant neoplasm of right fallopian tube: Secondary | ICD-10-CM | POA: Diagnosis not present

## 2021-02-25 DIAGNOSIS — Z171 Estrogen receptor negative status [ER-]: Secondary | ICD-10-CM

## 2021-02-25 DIAGNOSIS — Z79899 Other long term (current) drug therapy: Secondary | ICD-10-CM | POA: Diagnosis not present

## 2021-02-25 DIAGNOSIS — C7951 Secondary malignant neoplasm of bone: Secondary | ICD-10-CM | POA: Diagnosis not present

## 2021-02-25 DIAGNOSIS — C50512 Malignant neoplasm of lower-outer quadrant of left female breast: Secondary | ICD-10-CM

## 2021-02-25 DIAGNOSIS — D649 Anemia, unspecified: Secondary | ICD-10-CM | POA: Insufficient documentation

## 2021-02-25 DIAGNOSIS — Z95828 Presence of other vascular implants and grafts: Secondary | ICD-10-CM

## 2021-02-25 DIAGNOSIS — D696 Thrombocytopenia, unspecified: Secondary | ICD-10-CM | POA: Diagnosis not present

## 2021-02-25 LAB — CMP (CANCER CENTER ONLY)
ALT: 15 U/L (ref 0–44)
AST: 16 U/L (ref 15–41)
Albumin: 3.7 g/dL (ref 3.5–5.0)
Alkaline Phosphatase: 72 U/L (ref 38–126)
Anion gap: 11 (ref 5–15)
BUN: 9 mg/dL (ref 6–20)
CO2: 23 mmol/L (ref 22–32)
Calcium: 9.5 mg/dL (ref 8.9–10.3)
Chloride: 108 mmol/L (ref 98–111)
Creatinine: 0.66 mg/dL (ref 0.44–1.00)
GFR, Estimated: >60 mL/min (ref >60)
Glucose, Bld: 101 mg/dL — ABNORMAL HIGH (ref 70–99)
Potassium: 4.1 mmol/L (ref 3.5–5.1)
Sodium: 142 mmol/L (ref 135–145)
Total Bilirubin: 0.3 mg/dL (ref 0.3–1.2)
Total Protein: 7.6 g/dL (ref 6.5–8.1)

## 2021-02-25 LAB — CBC WITH DIFFERENTIAL (CANCER CENTER ONLY)
Abs Immature Granulocytes: 0.05 10*3/uL (ref 0.00–0.07)
Basophils Absolute: 0 10*3/uL (ref 0.0–0.1)
Basophils Relative: 0 %
Eosinophils Absolute: 0 10*3/uL (ref 0.0–0.5)
Eosinophils Relative: 0 %
HCT: 29.7 % — ABNORMAL LOW (ref 36.0–46.0)
Hemoglobin: 9.7 g/dL — ABNORMAL LOW (ref 12.0–15.0)
Immature Granulocytes: 1 %
Lymphocytes Relative: 24 %
Lymphs Abs: 1.1 10*3/uL (ref 0.7–4.0)
MCH: 35.1 pg — ABNORMAL HIGH (ref 26.0–34.0)
MCHC: 32.7 g/dL (ref 30.0–36.0)
MCV: 107.6 fL — ABNORMAL HIGH (ref 80.0–100.0)
Monocytes Absolute: 0.7 10*3/uL (ref 0.1–1.0)
Monocytes Relative: 14 %
Neutro Abs: 2.9 10*3/uL (ref 1.7–7.7)
Neutrophils Relative %: 61 %
Platelet Count: 131 10*3/uL — ABNORMAL LOW (ref 150–400)
RBC: 2.76 MIL/uL — ABNORMAL LOW (ref 3.87–5.11)
RDW: 14.8 % (ref 11.5–15.5)
WBC Count: 4.8 10*3/uL (ref 4.0–10.5)
nRBC: 0 % (ref 0.0–0.2)

## 2021-02-25 MED ORDER — SODIUM CHLORIDE 0.9% FLUSH
10.0000 mL | INTRAVENOUS | Status: DC | PRN
  Administered 2021-02-25: 10 mL
  Filled 2021-02-25: qty 10

## 2021-02-25 MED ORDER — HEPARIN SOD (PORK) LOCK FLUSH 100 UNIT/ML IV SOLN
500.0000 [IU] | Freq: Once | INTRAVENOUS | Status: AC | PRN
  Administered 2021-02-25: 500 [IU]
  Filled 2021-02-25: qty 5

## 2021-02-25 NOTE — Progress Notes (Signed)
Patient was here for an injection but a CMP wasn't drawn. She didn't want to wait for results today. So she rescheduled for 6/15. Patient was made aware.

## 2021-02-25 NOTE — Assessment & Plan Note (Signed)
Left lumpectomy: IDC grade 3, 1.2 cm, DCIS is present, margins negative, 0/6 lymph nodes negative, ER 0%, PR 0%, HER-2 positive ratio 2.28, Ki-67 20%, T1 BN 0 stage IA S/P Taxol-Herceptin BRCA 2 Mutation Positive  10/20/19:Bone mets involving Right hip, Bil Iliac bones, T8, T9, L2 and L3, Rt prox humerus, Righ glenoid. Some lytic and some sclerotic. Mild Left supraclav adenopathy, 2 small lesions basal ganglia (could be lacunar infarcts)  Biopsy the supra clav LN: 10/31/2019: Metastatic breast cancer, ER/PR negative HER-2 equivocal by IHC, FISHpositive Treatmentsummary: Kadcyla every 3-week treatment.Discontinued for progression  MRI of the back 01/07/2020: Extensive metastatic disease to the involved spine. Tumor replacement of L3 vertebral body with pathologic compression fracture, epidural and extraosseous extension. Tumor extending throughout L3 foramen Status post palliative radiation therapyCausing severe dysphagia and dehydration and hospitalization5/17/2021-02/03/2020 ------------------------------------------------------------------------------------------------------------------------------------------------------ Echocardiogram 11/14/2019 Current treatment:Talazoparib started 02/07/2020  Toxicities: Occasional mild nausea Esophagitis: I discussed with her if it is significant we can refer her to gastroenterology.  She will watch and monitor for now.  She will avoid eating late at night. Monitoring closely for cytopenias. Anemia: Hemoglobinimproved to 10.4 Thrombocytopenia:Platelets are 108 today.  Currentdosage of talazoparib to0.5 mgstarted 09/20/2020 CT CAP 09/20/19: No evidence of ST mets, tiny inferior right hepatic lesion substantially decreased(3 mm), bone mets have become sclerotic suggest healing CT CAP 12/26/2020: Stable diffuse bone mets    Return to clinic in 2 months with labs injection and follow-up.  Next CT scans will be done in October

## 2021-02-25 NOTE — Telephone Encounter (Signed)
Scheduled appointment per 06/14 los. Patient is aware.

## 2021-02-26 ENCOUNTER — Inpatient Hospital Stay: Payer: Medicaid Other

## 2021-02-26 ENCOUNTER — Other Ambulatory Visit: Payer: Self-pay

## 2021-02-26 VITALS — BP 120/64 | HR 84 | Resp 18

## 2021-02-26 DIAGNOSIS — D649 Anemia, unspecified: Secondary | ICD-10-CM | POA: Diagnosis not present

## 2021-02-26 DIAGNOSIS — C50512 Malignant neoplasm of lower-outer quadrant of left female breast: Secondary | ICD-10-CM | POA: Diagnosis not present

## 2021-02-26 DIAGNOSIS — C5701 Malignant neoplasm of right fallopian tube: Secondary | ICD-10-CM | POA: Diagnosis not present

## 2021-02-26 DIAGNOSIS — Z79899 Other long term (current) drug therapy: Secondary | ICD-10-CM | POA: Diagnosis not present

## 2021-02-26 DIAGNOSIS — C7951 Secondary malignant neoplasm of bone: Secondary | ICD-10-CM | POA: Diagnosis not present

## 2021-02-26 DIAGNOSIS — D696 Thrombocytopenia, unspecified: Secondary | ICD-10-CM | POA: Diagnosis not present

## 2021-02-26 DIAGNOSIS — Z171 Estrogen receptor negative status [ER-]: Secondary | ICD-10-CM | POA: Diagnosis not present

## 2021-02-26 DIAGNOSIS — Z95828 Presence of other vascular implants and grafts: Secondary | ICD-10-CM

## 2021-02-26 MED ORDER — DENOSUMAB 120 MG/1.7ML ~~LOC~~ SOLN
SUBCUTANEOUS | Status: AC
  Filled 2021-02-26: qty 1.7

## 2021-02-26 MED ORDER — DENOSUMAB 120 MG/1.7ML ~~LOC~~ SOLN
120.0000 mg | Freq: Once | SUBCUTANEOUS | Status: AC
  Administered 2021-02-26: 120 mg via SUBCUTANEOUS

## 2021-02-27 ENCOUNTER — Encounter: Payer: Self-pay | Admitting: Hematology and Oncology

## 2021-03-11 ENCOUNTER — Other Ambulatory Visit (HOSPITAL_COMMUNITY): Payer: Self-pay

## 2021-03-11 ENCOUNTER — Other Ambulatory Visit: Payer: Self-pay | Admitting: Hematology and Oncology

## 2021-03-11 DIAGNOSIS — C50512 Malignant neoplasm of lower-outer quadrant of left female breast: Secondary | ICD-10-CM

## 2021-03-11 MED ORDER — TALAZOPARIB TOSYLATE 0.25 MG PO CAPS
0.5000 mg | ORAL_CAPSULE | Freq: Every day | ORAL | 1 refills | Status: DC
  Filled 2021-03-11: qty 60, 30d supply, fill #0
  Filled 2021-04-14: qty 60, 30d supply, fill #1

## 2021-03-13 ENCOUNTER — Other Ambulatory Visit (HOSPITAL_COMMUNITY): Payer: Self-pay

## 2021-03-14 DIAGNOSIS — Z419 Encounter for procedure for purposes other than remedying health state, unspecified: Secondary | ICD-10-CM | POA: Diagnosis not present

## 2021-04-04 ENCOUNTER — Encounter: Payer: Self-pay | Admitting: Hematology and Oncology

## 2021-04-04 ENCOUNTER — Other Ambulatory Visit: Payer: Self-pay

## 2021-04-04 ENCOUNTER — Ambulatory Visit: Payer: Medicaid Other | Attending: Nurse Practitioner | Admitting: Family Medicine

## 2021-04-04 ENCOUNTER — Encounter: Payer: Self-pay | Admitting: Family Medicine

## 2021-04-04 VITALS — BP 121/74 | HR 94 | Ht 61.0 in | Wt 147.0 lb

## 2021-04-04 DIAGNOSIS — E1169 Type 2 diabetes mellitus with other specified complication: Secondary | ICD-10-CM | POA: Diagnosis not present

## 2021-04-04 DIAGNOSIS — E785 Hyperlipidemia, unspecified: Secondary | ICD-10-CM

## 2021-04-04 DIAGNOSIS — R110 Nausea: Secondary | ICD-10-CM | POA: Diagnosis not present

## 2021-04-04 DIAGNOSIS — D638 Anemia in other chronic diseases classified elsewhere: Secondary | ICD-10-CM

## 2021-04-04 LAB — POCT GLYCOSYLATED HEMOGLOBIN (HGB A1C): HbA1c, POC (controlled diabetic range): 5.7 % (ref 0.0–7.0)

## 2021-04-04 MED ORDER — ATORVASTATIN CALCIUM 40 MG PO TABS: 40.0000 mg | ORAL_TABLET | Freq: Every day | ORAL | 1 refills | Status: DC

## 2021-04-04 MED ORDER — ONDANSETRON 4 MG PO TBDP: 4.0000 mg | ORAL_TABLET | Freq: Three times a day (TID) | ORAL | 0 refills | Status: DC | PRN

## 2021-04-04 NOTE — Patient Instructions (Addendum)
Discontinue Glimepiride. Continue Metformin at current dose. Take Zofran as needed for nausea. I have refilled your Atorvastatin.    Nausea, Adult Nausea is feeling sick to your stomach or feeling that you are about to throw up (vomit). Feeling sick to your stomach is usually not serious, but it may be an earlysign of a more serious medical problem. As you feel sicker to your stomach, you may throw up. If you throw up, or if you are not able to drink enough fluids, there is a risk that you may lose too much water in your body (get dehydrated). If you lose too much water in your body, you may: Feel tired. Feel thirsty. Have a dry mouth. Have cracked lips. Go pee (urinate) less often. Older adults and people who have other diseases or a weak body defense system (immune system) have a higher risk of losing too much water in the body. The main goals of treating this condition are: To relieve your nausea. To ensure your nausea occurs less often. To prevent throwing up and losing too much fluid. Follow these instructions at home: Watch your symptoms for any changes. Tell your doctor about them. Follow theseinstructions as told by your doctor. Eating and drinking     Take an ORS (oral rehydration solution). This is a drink that is sold at pharmacies and stores. Drink clear fluids in small amounts as you are able. These include: Water. Ice chips. Fruit juice that has water added (diluted fruit juice). Low-calorie sports drinks. Eat bland, easy-to-digest foods in small amounts as you are able, such as: Bananas. Applesauce. Rice. Low-fat (lean) meats. Toast. Crackers. Avoid drinking fluids that have a lot of sugar or caffeine in them. This includes energy drinks, sports drinks, and soda. Avoid alcohol. Avoid spicy or fatty foods. General instructions Take over-the-counter and prescription medicines only as told by your doctor. Rest at home while you get better. Drink enough fluid to  keep your pee (urine) pale yellow. Take slow and deep breaths when you feel sick to your stomach. Avoid food or things that have strong smells. Wash your hands often with soap and water. If you cannot use soap and water, use hand sanitizer. Make sure that all people in your home wash their hands well and often. Keep all follow-up visits as told by your doctor. This is important. Contact a doctor if: You feel sicker to your stomach. You feel sick to your stomach for more than 2 days. You throw up. You are not able to drink fluids without throwing up. You have new symptoms. You have a fever. You have a headache. You have muscle cramps. You have a rash. You have pain while peeing. You feel light-headed or dizzy. Get help right away if: You have pain in your chest, neck, arm, or jaw. You feel very weak or you pass out (faint). You have throw up that is bright red or looks like coffee grounds. You have bloody or black poop (stools) or poop that looks like tar. You have a very bad headache, a stiff neck, or both. You have very bad pain, cramping, or bloating in your belly (abdomen). You have trouble breathing or you are breathing very quickly. Your heart is beating very quickly. Your skin feels cold and clammy. You feel confused. You have signs of losing too much water in your body, such as: Dark pee, very little pee, or no pee. Cracked lips. Dry mouth. Sunken eyes. Sleepiness. Weakness. These symptoms may be an emergency.  Do not wait to see if the symptoms will go away. Get medical help right away. Call your local emergency services (911 in the [place] Do not drive yourself to the hospital. Summary Nausea is feeling sick to your stomach or feeling that you are about to throw up (vomit). If you throw up, or if you are not able to drink enough fluids, there is a risk that you may lose too much water in your body (get dehydrated). Eat and drink what your doctor tells you. Take  over-the-counter and prescription medicines only as told by your doctor. Contact a doctor right away if your symptoms get worse or you have new symptoms. Keep all follow-up visits as told by your doctor. This is important. This information is not intended to replace advice given to you by your health care provider. Make sure you discuss any questions you have with your healthcare provider. Document Revised: 08/01/2019 Document Reviewed: 02/08/2018 Elsevier Patient Education  [place].

## 2021-04-04 NOTE — Progress Notes (Signed)
Established Patient Office Visit  Subjective:  Patient ID: Erin [name], female    DOB: 1961-06-08  Age: 60 y.o. MRN: [id number]  CC:  Chief Complaint  Patient presents with   Diabetes    HPI Erin [name] presents for evaluation of diabetes with hyperlipidemia follow-up. Patient is actively receiving treatment for breast cancer involving the left lower breast w/ bone mets. Patient has anemia related to chronic disease, last hemoglobin around 9.  Patient has type 2 diabetes which has traditionally remained well controlled. Last A1C 5.4 and today 5.7. She is currently taking metformin and glimepiride. She reports symptoms of hypoglycemia when taking glimepiride including dizziness and feeling off balance.  She also experiences nausea on occasion when eating late in the evenings. She doesn't have medication on hand for management of nausea. Endorses a overall healthy diet including mostly elimination of simple sugars  and increase vegetables and fruits. She denies any pain, SOB, chest pain or weakness. Past Medical History:  Diagnosis Date   Arthritis    knees, elbows   Borderline glaucoma of right eye    BRCA2 gene mutation positive 02/17/2018   BRCA2 c.4552del (p.[id number]*25)  Result reported out on 02/15/2018.    Breast cancer ([place]) 05/2017   left   Chronic back pain    Deaf    per pt born hearing and at age 36 1/2 lost hearing , was told by mother unknown cause, can miminally hear in left and no hearing on right    Depression    Dyspnea    states some SOB with ADLs and anemia    Elevated cancer antigen 125 (CA 125)    Family history of breast cancer    Family history of breast cancer    Family history of colon cancer    Genetic testing 02/17/2018   The Common Hereditary Cancer Panel offered by [facility] includes sequencing and/or deletion duplication testing of the following 47 genes: APC, ATM, AXIN2, BARD1, BMPR1A, BRCA1, BRCA2, BRIP1, CDH1, CDKN2A (p14ARF), CDKN2A (p16INK4a),  CKD4, CHEK2, CTNNA1, DICER1, EPCAM (Deletion/duplication testing only), GREM1 (promoter region deletion/duplication testing only), KIT, MEN1, MLH1, MSH2, MSH3, MSH6, MU   History of cancer chemotherapy    left breast cancer 09-08-2017 to 10-09-2017;  fallopion tube cancer  12-15-2017  to 06-28-2018   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 oncologist-  dr [name]   11-04-2017  s/p  LAVH w/ BSO,  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;  completed chemo 06-28-2018   History of external beam radiation therapy    left breast  11-25-2017  to 01-07-2018   Hyperlipidemia    Hypertension    followed by pcp   (10-05-2019  per pt never had stress test)   IDA (iron deficiency anemia)    Malignant neoplasm of lower-outer quadrant of left breast of female, estrogen receptor negative [facility]) oncologist-- dr [name]   dx 08/ 2018--- Stage IA, DCIS,  ER/ PR negative,  HER-2 positive;  06-09-2017 s/p left breast lumpectomy with node dissection;  completed chemo 10-09-2017  and radiation 01-07-2018/  hercepton completed 06-28-2018   Non-insulin dependent type 2 diabetes mellitus ([place])    followed by pcp   (10-05-2019 per pt check cbg every other day in AM,  fasting cbg-- 105)   Numbness of right thumb    Wears glasses     Past Surgical History:  Procedure Laterality Date   BREAST LUMPECTOMY WITH RADIOACTIVE SEED AND SENTINEL  LYMPH NODE BIOPSY Left 06/09/2017   Procedure: BREAST LUMPECTOMY WITH RADIOACTIVE SEED AND SENTINEL LYMPH NODE BIOPSY;  Surgeon: Excell Seltzer, MD;  Location: [facility];  Service: General;  Laterality: Left;   COLONOSCOPY  11/02/2017   polyps   HERNIA REPAIR     HIATAL HERNIA REPAIR  09/2019   LAPAROSCOPIC ASSISTED VAGINAL HYSTERECTOMY N/A 11/04/2017   Procedure: LAPAROSCOPIC ASSISTED VAGINAL HYSTERECTOMY;  Surgeon: [name], MD;  Location: [facility];  Service: Gynecology;  Laterality: N/A;   LAPAROSCOPIC BILATERAL  SALPINGO OOPHERECTOMY Bilateral 11/04/2017   Procedure: LAPAROSCOPIC BILATERAL SALPINGO OOPHORECTOMY;  Surgeon: [name], MD;  Location: [facility];  Service: Gynecology;  Laterality: Bilateral;   LAPAROSCOPY N/A 10/09/2019   Procedure: LAPAROSCOPY DIAGNOSTIC WITH PERITONEAL BIOPSIES;  Surgeon: [name], MD;  Location: [facility];  Service: Gynecology;  Laterality: N/A;   PORTACATH PLACEMENT Right 06/09/2017   Procedure: INSERTION PORT-A-CATH WITH [place];  Surgeon: Excell Seltzer, MD;  Location: [facility];  Service: General;  Laterality: Right;   PORTACATH PLACEMENT Right 11/13/2019   Procedure: INSERTION PORT-A-CATH WITH ULTRASOUND GUIDANCE;  Surgeon: Rolm Bookbinder, MD;  Location: [facility];  Service: General;  Laterality: Right;   TUBAL LIGATION  02/02/2002   @[facility]    PP[facility]   UPPER GI ENDOSCOPY      Family History  Problem Relation Age of Onset   Hypertension Mother    Lupus Mother    Diabetes Father    Hypertension Sister    Diabetes Paternal Grandmother    Colon cancer Paternal Grandmother 63   CAD Brother    Diabetes Brother    Heart attack Brother    Breast cancer Maternal Aunt        dx >50   Heart attack Paternal Grandfather    Breast cancer Maternal Aunt        dx under 66   Breast cancer Maternal Aunt        dx  under 42    Social History   Socioeconomic History   Marital status: Legally Separated    Spouse name: Not on file   Number of children: 2   Years of education: Not on file   Highest education level: Not on file  Occupational History   Not on file  Tobacco Use   Smoking status: Never   Smokeless tobacco: Never  Vaping Use   Vaping Use: Never used  Substance and Sexual Activity   Alcohol use: No   Drug use: Never   Sexual activity: Not on file  Other Topics Concern   Not on file  Social History Narrative   1 boy and 1 girl   Resides in [place]   Seperated   Social Determinants of Health    Financial Resource Strain: Not on file  Food Insecurity: Not on file  Transportation Needs: Not on file  Physical Activity: Not on file  Stress: Not on file  Social Connections: Not on file  Intimate Partner Violence: Not on file    Outpatient Medications Prior to Visit  Medication Sig Dispense Refill   Accu-Chek FastClix Lancets MISC Use as instructed. Inject into the skin twice daily 100 each 3   atorvastatin (LIPITOR) 40 MG tablet Take 1 tablet (40 mg total) by mouth daily. 90 tablet 1   Blood Glucose Calibration (ACCU-CHEK GUIDE CONTROL) LIQD 1 each by In Vitro route once as needed for up to 1 dose. 1 each 0   cholecalciferol (VITAMIN  D) 1000 units tablet Take 1,000 Units by mouth daily.     glimepiride (AMARYL) 2 MG tablet Take 1 tablet (2 mg total) by mouth daily with breakfast. To lower blood sugar 90 tablet 1   glucose blood (ACCU-CHEK GUIDE) test strip Use as instructed. Check blood glucose by fingerstick twice per day. 100 each 12   Lancet Devices (ACCU-CHEK SOFTCLIX) lancets Use as instructed 1 each 0   lidocaine-prilocaine (EMLA) cream Apply 1 application topically as needed. 30 g 5   metFORMIN (GLUCOPHAGE) 500 MG tablet TAKE 2 TABLETS BY MOUTH 2 (TWO) TIMES DAILY WITH A MEAL. 360 tablet 1   talazoparib tosylate (TALZENNA) 0.25 MG capsule Take 2 capsules (0.5 mg total) by mouth daily. 60 capsule 1   Facility-Administered Medications Prior to Visit  Medication Dose Route Frequency Provider Last Rate Last Admin   heparin lock flush 100 unit/mL  500 Units Intracatheter Once PRN [name], MD       sodium chloride flush (NS) 0.9 % injection 10 mL  10 mL Intracatheter PRN [name], MD        No Known Allergies  ROS Review of Systems   Pertinent negatives listed in HPI  Objective:    Physical Exam Constitutional: Patient appears well-developed and well-nourished. No distress. HENT: Normocephalic, atraumatic, External right and left ear normal. Oropharynx is  clear and moist.  Eyes: Conjunctivae and EOM are normal. PERRLA, no scleral icterus. Neck: Normal ROM. Neck supple. No JVD. No tracheal deviation. No thyromegaly. CVS: RRR, S1/S2 +, no murmurs, no gallops, no carotid bruit.  Pulmonary: Effort and breath sounds normal, no stridor, rhonchi, wheezes, rales.  Abdominal: Soft. BS +, no distension, tenderness, rebound or guarding.  Musculoskeletal: Normal range of motion. No edema and no tenderness.  Neuro: Alert. Normal reflexes, muscle tone coordination. No cranial nerve deficit. Skin: Skin is warm and dry. No rash noted. Not diaphoretic. No erythema. No pallor. Psychiatric: Normal mood and affect. Behavior, judgment, thought content normal.   LMP 07/09/2017 (LMP Unknown)  Wt Readings from Last 3 Encounters:  02/25/21 147 lb 4.8 oz (66.8 kg)  12/26/20 143 lb 1.6 oz (64.9 kg)  11/14/20 143 lb 1.6 oz (64.9 kg)      Assessment & Plan:   Problem List Items Addressed This Visit   None Visit Diagnoses     Hyperlipidemia associated with type 2 diabetes mellitus ([place])    -  Primary   Relevant Medications   atorvastatin (LIPITOR) 40 MG tablet   Other Relevant Orders   POCT glycosylated hemoglobin (Hb A1C)   Lipid panel   Nausea in adult       Relevant Orders   Comprehensive metabolic panel   Chronic disease anemia       Relevant Orders   CBC with Differential       Meds ordered this encounter  Medications   atorvastatin (LIPITOR) 40 MG tablet    Sig: Take 1 tablet (40 mg total) by mouth daily.    Dispense:  90 tablet    Refill:  1   ondansetron (ZOFRAN ODT) 4 MG disintegrating tablet    Sig: Take 1 tablet (4 mg total) by mouth every 8 (eight) hours as needed for nausea or vomiting.    Dispense:  30 tablet    Refill:  0    Follow-up: Return in about 6 months (around 10/05/2021) for Diabetes follow-up.    Molli Barrows, FNP

## 2021-04-05 LAB — COMPREHENSIVE METABOLIC PANEL
ALT: 15 IU/L (ref 0–32)
AST: 12 IU/L (ref 0–40)
Albumin/Globulin Ratio: 1.4 (ref 1.2–2.2)
Albumin: 4.2 g/dL (ref 3.8–4.9)
Alkaline Phosphatase: 89 IU/L (ref 44–121)
BUN/Creatinine Ratio: 19 (ref 9–23)
BUN: 11 mg/dL (ref 6–24)
Bilirubin Total: 0.3 mg/dL (ref 0.0–1.2)
CO2: 25 mmol/L (ref 20–29)
Calcium: 9.3 mg/dL (ref 8.7–10.2)
Chloride: 102 mmol/L (ref 96–106)
Creatinine, Ser: 0.57 mg/dL (ref 0.57–1.00)
Globulin, Total: 3 g/dL (ref 1.5–4.5)
Glucose: 95 mg/dL (ref 65–99)
Potassium: 3.8 mmol/L (ref 3.5–5.2)
Sodium: 141 mmol/L (ref 134–144)
Total Protein: 7.2 g/dL (ref 6.0–8.5)
eGFR: 106 mL/min/{1.73_m2} (ref >59)

## 2021-04-05 LAB — CBC WITH DIFFERENTIAL/PLATELET
Basophils Absolute: 0 10*3/uL (ref 0.0–0.2)
Basos: 0 %
EOS (ABSOLUTE): 0 10*3/uL (ref 0.0–0.4)
Eos: 1 %
Hematocrit: 31.5 % — ABNORMAL LOW (ref 34.0–46.6)
Hemoglobin: 10.9 g/dL — ABNORMAL LOW (ref 11.1–15.9)
Immature Grans (Abs): 0 10*3/uL (ref 0.0–0.1)
Immature Granulocytes: 0 %
Lymphocytes Absolute: 1.2 10*3/uL (ref 0.7–3.1)
Lymphs: 24 %
MCH: 35.3 pg — ABNORMAL HIGH (ref 26.6–33.0)
MCHC: 34.6 g/dL (ref 31.5–35.7)
MCV: 102 fL — ABNORMAL HIGH (ref 79–97)
Monocytes Absolute: 0.3 10*3/uL (ref 0.1–0.9)
Monocytes: 7 %
Neutrophils Absolute: 3.6 10*3/uL (ref 1.4–7.0)
Neutrophils: 68 %
Platelets: 65 10*3/uL — CL (ref 150–450)
RBC: 3.09 x10E6/uL — ABNORMAL LOW (ref 3.77–5.28)
RDW: 14.6 % (ref 11.7–15.4)
WBC: 5.2 10*3/uL (ref 3.4–10.8)

## 2021-04-05 LAB — LIPID PANEL
Chol/HDL Ratio: 2.6 ratio (ref 0.0–4.4)
Cholesterol, Total: 107 mg/dL (ref 100–199)
HDL: 41 mg/dL (ref >39)
LDL Chol Calc (NIH): 50 mg/dL (ref 0–99)
Triglycerides: 80 mg/dL (ref 0–149)
VLDL Cholesterol Cal: 16 mg/dL (ref 5–40)

## 2021-04-06 ENCOUNTER — Telehealth: Payer: Self-pay | Admitting: Family Medicine

## 2021-04-06 DIAGNOSIS — D696 Thrombocytopenia, unspecified: Secondary | ICD-10-CM

## 2021-04-06 NOTE — Telephone Encounter (Signed)
Please contact patient to have her return for repeat for repeat CBC. Her platelet count was abnormally low which may be related to her treatment with talazoparib (cancer treatment) or could be a result of collecting the blood. All other labs were stable. CBC pending

## 2021-04-07 NOTE — Telephone Encounter (Signed)
I have cc'd DR. Geralyn Flash nurse. She needs to follow up with Oncology for her platelets. They are aware of her history of pancytopenia

## 2021-04-08 ENCOUNTER — Other Ambulatory Visit (HOSPITAL_COMMUNITY): Payer: Self-pay

## 2021-04-09 ENCOUNTER — Other Ambulatory Visit: Payer: Self-pay

## 2021-04-09 DIAGNOSIS — D61818 Other pancytopenia: Secondary | ICD-10-CM

## 2021-04-11 ENCOUNTER — Other Ambulatory Visit: Payer: Self-pay

## 2021-04-11 ENCOUNTER — Inpatient Hospital Stay: Payer: Medicaid Other | Attending: Gynecologic Oncology

## 2021-04-11 DIAGNOSIS — C50512 Malignant neoplasm of lower-outer quadrant of left female breast: Secondary | ICD-10-CM | POA: Diagnosis present

## 2021-04-11 DIAGNOSIS — Z452 Encounter for adjustment and management of vascular access device: Secondary | ICD-10-CM | POA: Insufficient documentation

## 2021-04-11 DIAGNOSIS — Z171 Estrogen receptor negative status [ER-]: Secondary | ICD-10-CM

## 2021-04-11 DIAGNOSIS — C7951 Secondary malignant neoplasm of bone: Secondary | ICD-10-CM | POA: Insufficient documentation

## 2021-04-11 DIAGNOSIS — Z95828 Presence of other vascular implants and grafts: Secondary | ICD-10-CM

## 2021-04-11 LAB — CBC WITH DIFFERENTIAL (CANCER CENTER ONLY)
Abs Immature Granulocytes: 0.01 10*3/uL (ref 0.00–0.07)
Basophils Absolute: 0 10*3/uL (ref 0.0–0.1)
Basophils Relative: 0 %
Eosinophils Absolute: 0 10*3/uL (ref 0.0–0.5)
Eosinophils Relative: 0 %
HCT: 28.7 % — ABNORMAL LOW (ref 36.0–46.0)
Hemoglobin: 9.6 g/dL — ABNORMAL LOW (ref 12.0–15.0)
Immature Granulocytes: 0 %
Lymphocytes Relative: 28 %
Lymphs Abs: 0.9 10*3/uL (ref 0.7–4.0)
MCH: 35.4 pg — ABNORMAL HIGH (ref 26.0–34.0)
MCHC: 33.4 g/dL (ref 30.0–36.0)
MCV: 105.9 fL — ABNORMAL HIGH (ref 80.0–100.0)
Monocytes Absolute: 0.3 10*3/uL (ref 0.1–1.0)
Monocytes Relative: 10 %
Neutro Abs: 1.9 10*3/uL (ref 1.7–7.7)
Neutrophils Relative %: 62 %
Platelet Count: 75 10*3/uL — ABNORMAL LOW (ref 150–400)
RBC: 2.71 MIL/uL — ABNORMAL LOW (ref 3.87–5.11)
RDW: 14.9 % (ref 11.5–15.5)
WBC Count: 3.1 10*3/uL — ABNORMAL LOW (ref 4.0–10.5)
nRBC: 0 % (ref 0.0–0.2)

## 2021-04-11 MED ORDER — SODIUM CHLORIDE 0.9% FLUSH
10.0000 mL | INTRAVENOUS | Status: DC | PRN
  Administered 2021-04-11: 10 mL
  Filled 2021-04-11: qty 10

## 2021-04-11 MED ORDER — HEPARIN SOD (PORK) LOCK FLUSH 100 UNIT/ML IV SOLN
500.0000 [IU] | Freq: Once | INTRAVENOUS | Status: AC | PRN
  Administered 2021-04-11: 500 [IU]
  Filled 2021-04-11: qty 5

## 2021-04-11 NOTE — Telephone Encounter (Signed)
Patient had repeat CBC today.    RN reviewed results with Wilber Bihari, NP.  No new orders, patient to continue Talzenna as prescribed.   RN notified patient, patient verbalized understanding and agreement.

## 2021-04-14 ENCOUNTER — Other Ambulatory Visit (HOSPITAL_COMMUNITY): Payer: Self-pay

## 2021-04-28 ENCOUNTER — Telehealth: Payer: Self-pay | Admitting: *Deleted

## 2021-04-28 DIAGNOSIS — C7951 Secondary malignant neoplasm of bone: Secondary | ICD-10-CM

## 2021-04-28 NOTE — Telephone Encounter (Signed)
Notified of Xrays ordered. Will go TueSDAY

## 2021-04-28 NOTE — Telephone Encounter (Signed)
Pt states she was in [facility] and was keeping a dog. She fell and hurt her back. Is walking OK, but is hurting in her right hip and lower back. Please advise

## 2021-04-29 ENCOUNTER — Other Ambulatory Visit: Payer: Self-pay | Admitting: *Deleted

## 2021-04-29 ENCOUNTER — Telehealth: Payer: Self-pay | Admitting: *Deleted

## 2021-04-29 ENCOUNTER — Ambulatory Visit (HOSPITAL_COMMUNITY)
Admission: RE | Admit: 2021-04-29 | Discharge: 2021-04-29 | Disposition: A | Discharge status: Home / Self Care | Payer: Medicaid Other | Source: Ambulatory Visit | Attending: Hematology and Oncology | Admitting: Hematology and Oncology

## 2021-04-29 ENCOUNTER — Other Ambulatory Visit: Payer: Self-pay

## 2021-04-29 DIAGNOSIS — M25551 Pain in right hip: Secondary | ICD-10-CM | POA: Diagnosis not present

## 2021-04-29 DIAGNOSIS — M545 Low back pain, unspecified: Secondary | ICD-10-CM | POA: Diagnosis not present

## 2021-04-29 DIAGNOSIS — C7951 Secondary malignant neoplasm of bone: Secondary | ICD-10-CM | POA: Insufficient documentation

## 2021-04-29 NOTE — Telephone Encounter (Signed)
Notified patient that Xray showed no fractures. Dr [name] suggested using ice and heat. Is OK to take Advil if needed.

## 2021-05-13 ENCOUNTER — Other Ambulatory Visit: Payer: Self-pay | Admitting: Hematology and Oncology

## 2021-05-13 ENCOUNTER — Other Ambulatory Visit (HOSPITAL_COMMUNITY): Payer: Self-pay

## 2021-05-13 DIAGNOSIS — C50512 Malignant neoplasm of lower-outer quadrant of left female breast: Secondary | ICD-10-CM

## 2021-05-13 DIAGNOSIS — Z171 Estrogen receptor negative status [ER-]: Secondary | ICD-10-CM

## 2021-05-13 MED ORDER — TALAZOPARIB TOSYLATE 0.25 MG PO CAPS
0.5000 mg | ORAL_CAPSULE | Freq: Every day | ORAL | 1 refills | Status: DC
  Filled 2021-05-20: qty 60, 30d supply, fill #0
  Filled 2021-06-13: qty 60, 30d supply, fill #1

## 2021-05-14 DIAGNOSIS — H00021 Hordeolum internum right upper eyelid: Secondary | ICD-10-CM | POA: Diagnosis not present

## 2021-05-14 DIAGNOSIS — H524 Presbyopia: Secondary | ICD-10-CM | POA: Diagnosis not present

## 2021-05-14 DIAGNOSIS — E119 Type 2 diabetes mellitus without complications: Secondary | ICD-10-CM | POA: Diagnosis not present

## 2021-05-14 DIAGNOSIS — H1045 Other chronic allergic conjunctivitis: Secondary | ICD-10-CM | POA: Diagnosis not present

## 2021-05-14 DIAGNOSIS — H5213 Myopia, bilateral: Secondary | ICD-10-CM | POA: Diagnosis not present

## 2021-05-14 DIAGNOSIS — H40023 Open angle with borderline findings, high risk, bilateral: Secondary | ICD-10-CM | POA: Diagnosis not present

## 2021-05-14 DIAGNOSIS — H2513 Age-related nuclear cataract, bilateral: Secondary | ICD-10-CM | POA: Diagnosis not present

## 2021-05-14 DIAGNOSIS — H52223 Regular astigmatism, bilateral: Secondary | ICD-10-CM | POA: Diagnosis not present

## 2021-05-14 DIAGNOSIS — H538 Other visual disturbances: Secondary | ICD-10-CM | POA: Diagnosis not present

## 2021-05-15 ENCOUNTER — Other Ambulatory Visit (HOSPITAL_COMMUNITY): Payer: Self-pay

## 2021-05-20 ENCOUNTER — Other Ambulatory Visit (HOSPITAL_COMMUNITY): Payer: Self-pay

## 2021-05-21 ENCOUNTER — Other Ambulatory Visit (HOSPITAL_COMMUNITY): Payer: Self-pay

## 2021-05-31 NOTE — Progress Notes (Signed)
Patient Care Team: [name], NP as PCP - General (Nurse Practitioner) Excell Seltzer, MD (Inactive) as Consulting Physician (General Surgery) [name], MD as Consulting Physician (Hematology and Oncology) Kyung Rudd, MD as Consulting Physician (Radiation Oncology) [name], MD as Consulting Physician (Gynecologic Oncology) Delice Bison, Charlestine Massed, NP as Nurse Practitioner (Hematology and Oncology) Mauro Kaufmann, RN as Oncology Nurse Navigator Rockwell Germany, RN as Oncology Nurse Navigator  DIAGNOSIS:    ICD-10-CM   1. Fallopian tube cancer, carcinoma, right (HCC)  C57.01 CT CHEST ABDOMEN PELVIS W CONTRAST    2. Malignant neoplasm of lower-outer quadrant of left breast of female, estrogen receptor negative ([place])  C50.512 CT CHEST ABDOMEN PELVIS W CONTRAST   Z17.1       SUMMARY OF ONCOLOGIC HISTORY: Oncology History  Malignant neoplasm of lower-outer quadrant of left breast of female, estrogen receptor negative ([place])  05/11/2017 Initial Diagnosis   Left breast biopsy 3:30 position 6 cm from nipple: IDC with DCIS, lymphovascular invasion present, grade 2-3, ER 0%, PR 0%, HER-2 positive ratio 2.28, Ki-67 20%, 1 cm lesion left breast, T1b N0 stage IA clinical stage   06/09/2017 Surgery   Left lumpectomy: IDC grade 3, 1.2 cm, DCIS is present, margins negative, 0/6 lymph nodes negative, ER 0%, PR 0%, HER-2 positive ratio 2.28, Ki-67 20%, T1 BN 0 stage IA   07/09/2017 - 09/29/2017 Adjuvant Chemotherapy   Taxol/Herceptin x 12 completed on 09/29/2017, went on to receive every 3 week adjuvant Herceptin through 06/28/2018   11/25/2017 - 01/07/2018 Radiation Therapy    The patient initially received a dose of 50.4 Gy in 28 fractions to the breast using whole-breast tangent fields. This was delivered using a 3-D conformal technique. The patient then received a boost to the seroma. This delivered an additional 10 Gy in 5 fractions using a 3-D technique. The total dose was  60.4 Gy.   02/15/2018 Genetic Testing   The Common Hereditary Cancer Panel offered by [facility] includes sequencing and/or deletion duplication testing of the following 47 genes: APC, ATM, AXIN2, BARD1, BMPR1A, BRCA1, BRCA2, BRIP1, CDH1, CDKN2A (p14ARF), CDKN2A (p16INK4a), CKD4, CHEK2, CTNNA1, DICER1, EPCAM (Deletion/duplication testing only), GREM1 (promoter region deletion/duplication testing only), KIT, MEN1, MLH1, MSH2, MSH3, MSH6, MUTYH, NBN, NF1, NHTL1, PALB2, PDGFRA, PMS2, POLD1, POLE, PTEN, RAD50, RAD51C, RAD51D, SDHB, SDHC, SDHD, SMAD4, SMARCA4. STK11, TP53, TSC1, TSC2, and VHL.  The following genes were evaluated for sequence changes only: SDHA and HOXB13 c.251G>A variant only.   Results: POSITIVE. 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 The date of this test report is 02/15/2018   10/20/2019 PET scan   Bone mets involving Right hip, Bil Iliac bones, T8, T9, L2 and L3, Rt prox humerus, Righ glenoid. Some lytic and some sclerotic. Mild Left supraclav adenopathy, 2 small lesions basal ganglia (could be lacunar infarcts)   11/14/2019 - 12/26/2019 Chemotherapy   The patient had palonosetron (ALOXI) injection 0.25 mg, 0.25 mg, Intravenous,  Once, 2 of 5 cycles Administration: 0.25 mg (12/05/2019), 0.25 mg (12/26/2019) ado-trastuzumab emtansine (KADCYLA) 260 mg in sodium chloride 0.9 % 250 mL chemo infusion, 3.6 mg/kg = 260 mg, Intravenous, Once, 3 of 6 cycles Administration: 260 mg (11/14/2019), 260 mg (12/05/2019), 260 mg (12/26/2019)   for chemotherapy treatment.     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  Required TPN briefly.  Patient was able to  swallow and keep food down by the time of discharge.   02/06/2020 Miscellaneous   Talazoparib   Fallopian tube cancer, carcinoma, right ([place])  11/04/2017 Surgery   Uterus, cervix and  bilateral fallopian tubes: Fallopian tube with serous carcinoma 0.6 cm positive for p53, PAX8, WT-1, MOC-31, cytokeratin 7, and estrogen receptor   01/11/2018 -  Chemotherapy   Taxol and carboplatin every 3 weeks (Taxol discontinued with cycle 2 because of profound rash)   10/20/2019 PET scan   Bone mets involving Right hip, Bil Iliac bones, T8, T9, L2 and L3, Rt prox humerus, Righ glenoid. Some lytic and some sclerotic. Mild Left supraclav adenopathy, 2 small lesions basal ganglia (could be lacunar infarcts)   11/14/2019 - 12/26/2019 Chemotherapy   The patient had palonosetron (ALOXI) injection 0.25 mg, 0.25 mg, Intravenous,  Once, 2 of 5 cycles Administration: 0.25 mg (12/05/2019), 0.25 mg (12/26/2019) ado-trastuzumab emtansine (KADCYLA) 260 mg in sodium chloride 0.9 % 250 mL chemo infusion, 3.6 mg/kg = 260 mg, Intravenous, Once, 3 of 6 cycles Administration: 260 mg (11/14/2019), 260 mg (12/05/2019), 260 mg (12/26/2019)   for chemotherapy treatment.       CHIEF COMPLIANT: Follow-up of metastatic breast cancer  INTERVAL HISTORY: Erin [name] is a 60 y.o. with above-mentioned history of metastatic breast cancer who is currently on talazoparib and Xgeva. She presents to the clinic today for a toxicity check.  She is tolerating talazoparib extremely well.  Her only complaint is sometimes when she swallows big pills like metformin to get stuck in the esophagus and sometimes she has to throw up.  She has a history of esophagitis.  ALLERGIES:  has No Known Allergies.  MEDICATIONS:  Current Outpatient Medications  Medication Sig Dispense Refill   Accu-Chek FastClix Lancets MISC Use as instructed. Inject into the skin twice daily 100 each 3   atorvastatin (LIPITOR) 40 MG tablet Take 1 tablet (40 mg total) by mouth daily. 90 tablet 1   Blood Glucose Calibration (ACCU-CHEK GUIDE CONTROL) LIQD 1 each by In Vitro route once as needed for up to 1 dose. 1 each 0   cholecalciferol (VITAMIN D) 1000 units  tablet Take 1,000 Units by mouth daily.     glucose blood (ACCU-CHEK GUIDE) test strip Use as instructed. Check blood glucose by fingerstick twice per day. 100 each 12   Lancet Devices (ACCU-CHEK SOFTCLIX) lancets Use as instructed 1 each 0   lidocaine-prilocaine (EMLA) cream Apply 1 application topically as needed. 30 g 5   metFORMIN (GLUCOPHAGE) 500 MG tablet TAKE 2 TABLETS BY MOUTH 2 (TWO) TIMES DAILY WITH A MEAL. 360 tablet 1   ondansetron (ZOFRAN ODT) 4 MG disintegrating tablet Take 1 tablet (4 mg total) by mouth every 8 (eight) hours as needed for nausea or vomiting. 30 tablet 0   talazoparib tosylate (TALZENNA) 0.25 MG capsule Take 2 capsules (0.5 mg total) by mouth daily. 60 capsule 1   No current facility-administered medications for this visit.   Facility-Administered Medications Ordered in Other Visits  Medication Dose Route Frequency Provider Last Rate Last Admin   heparin lock flush 100 unit/mL  500 Units Intracatheter Once PRN [name], MD       sodium chloride flush (NS) 0.9 % injection 10 mL  10 mL Intracatheter PRN [name], MD        PHYSICAL EXAMINATION: ECOG PERFORMANCE STATUS: 1 - Symptomatic but completely ambulatory  Vitals:   06/02/21 0912  BP: 130/63  Pulse: 85  Resp: 18  Temp:  97.8 F (36.6 C)  SpO2: 100%   Filed Weights   06/02/21 0912  Weight: 144 lb 12.8 oz (65.7 kg)      LABORATORY DATA:  I have reviewed the data as listed CMP Latest Ref Rng & Units 04/04/2021 02/25/2021 12/25/2020  Glucose 65 - 99 mg/dL 95 101(H) -  BUN 6 - 24 mg/dL 11 9 -  Creatinine 0.57 - 1.00 mg/dL 0.57 0.66 0.50  Sodium 134 - 144 mmol/L 141 142 -  Potassium 3.5 - 5.2 mmol/L 3.8 4.1 -  Chloride 96 - 106 mmol/L 102 108 -  CO2 20 - 29 mmol/L 25 23 -  Calcium 8.7 - 10.2 mg/dL 9.3 9.5 -  Total Protein 6.0 - 8.5 g/dL 7.2 7.6 -  Total Bilirubin 0.0 - 1.2 mg/dL 0.3 0.3 -  Alkaline Phos 44 - 121 IU/L 89 72 -  AST 0 - 40 IU/L 12 16 -  ALT 0 - 32 IU/L 15 15 -    Lab  Results  Component Value Date   WBC 5.4 06/02/2021   HGB 10.1 (L) 06/02/2021   HCT 30.8 (L) 06/02/2021   MCV 105.8 (H) 06/02/2021   PLT 53 (L) 06/02/2021   NEUTROABS 4.0 06/02/2021    ASSESSMENT & PLAN:  Malignant neoplasm of lower-outer quadrant of left breast of female, estrogen receptor negative (HCC) Left lumpectomy: IDC grade 3, 1.2 cm, DCIS is present, margins negative, 0/6 lymph nodes negative, ER 0%, PR 0%, HER-2 positive ratio 2.28, Ki-67 20%, T1 BN 0 stage IA S/P Taxol-Herceptin BRCA 2 Mutation Positive   10/20/19: Bone mets involving Right hip, Bil Iliac bones, T8, T9, L2 and L3, Rt prox humerus, Righ glenoid. Some lytic and some sclerotic. Mild Left supraclav adenopathy, 2 small lesions basal ganglia (could be lacunar infarcts)   Biopsy the supra clav LN: 10/31/2019: Metastatic breast cancer, ER/PR negative HER-2 equivocal by IHC, FISH positive Treatment summary: Kadcyla every 3-week treatment.  Discontinued for progression   MRI of the back 01/07/2020: Extensive metastatic disease to the involved spine.  Tumor replacement of L3 vertebral body with pathologic compression fracture, epidural and extraosseous extension.  Tumor extending throughout L3 foramen Status post palliative radiation therapy Causing severe dysphagia and dehydration and hospitalization 01/29/2020-02/03/2020 ------------------------------------------------------------------------------------------------------------------------------------------------------ Echocardiogram 11/14/2019 Current treatment: Talazoparib started 02/07/2020   Toxicities: Occasional mild nausea Esophagitis: Sometimes it becomes difficult to swallow because of the Monitoring closely for cytopenias. Anemia: Hemoglobin improved to 10.1 Thrombocytopenia: Platelets are 53 today.   Current dosage of talazoparib to 0.5 mg started 09/20/2020 CT CAP 09/20/19: No evidence of ST mets, tiny inferior right hepatic lesion substantially decreased (3 mm),  bone mets have become sclerotic suggest healing CT CAP 12/26/2020: Stable diffuse bone mets  Recent fall and injury to the back: X-rays: Negative for acute fractures, bone metastases Plan: CT chest abdomen pelvis in October and follow-up after that    Orders Placed This Encounter  Procedures   CT CHEST ABDOMEN PELVIS W CONTRAST    Standing Status:   Future    Standing Expiration Date:   06/02/2022    Order Specific Question:   Is patient pregnant?    Answer:   No    Order Specific Question:   Preferred imaging location?    Answer:   [facility]    Order Specific Question:   Release to patient    Answer:   Immediate    Order Specific Question:   Is Oral Contrast requested for this exam?  Answer:   Yes, Per Radiology protocol    Order Specific Question:   Reason for Exam (SYMPTOM  OR DIAGNOSIS REQUIRED)    Answer:   Met breast cancer restaging    The patient has a good understanding of the overall plan. she agrees with it. she will call with any problems that may develop before the next visit here.  Total time spent: 30 mins including face to face time and time spent for planning, charting and coordination of care  Rulon Eisenmenger, MD, MPH 06/02/2021  I, Thana Ates, am acting as scribe for [name].  I have reviewed the above documentation for accuracy and completeness, and I agree with the above.

## 2021-06-02 ENCOUNTER — Other Ambulatory Visit: Payer: Self-pay

## 2021-06-02 ENCOUNTER — Inpatient Hospital Stay (HOSPITAL_BASED_OUTPATIENT_CLINIC_OR_DEPARTMENT_OTHER): Payer: Medicare Other | Admitting: Hematology and Oncology

## 2021-06-02 ENCOUNTER — Inpatient Hospital Stay: Payer: Medicare Other | Attending: Gynecologic Oncology

## 2021-06-02 ENCOUNTER — Inpatient Hospital Stay: Payer: Medicare Other

## 2021-06-02 VITALS — BP 130/63 | HR 85 | Temp 97.8°F | Resp 18 | Ht 61.0 in | Wt 144.8 lb

## 2021-06-02 DIAGNOSIS — C5700 Malignant neoplasm of unspecified fallopian tube: Secondary | ICD-10-CM | POA: Diagnosis not present

## 2021-06-02 DIAGNOSIS — Z79899 Other long term (current) drug therapy: Secondary | ICD-10-CM | POA: Diagnosis not present

## 2021-06-02 DIAGNOSIS — C7951 Secondary malignant neoplasm of bone: Secondary | ICD-10-CM | POA: Diagnosis not present

## 2021-06-02 DIAGNOSIS — Z95828 Presence of other vascular implants and grafts: Secondary | ICD-10-CM

## 2021-06-02 DIAGNOSIS — D649 Anemia, unspecified: Secondary | ICD-10-CM | POA: Diagnosis not present

## 2021-06-02 DIAGNOSIS — Z452 Encounter for adjustment and management of vascular access device: Secondary | ICD-10-CM | POA: Insufficient documentation

## 2021-06-02 DIAGNOSIS — C50512 Malignant neoplasm of lower-outer quadrant of left female breast: Secondary | ICD-10-CM

## 2021-06-02 DIAGNOSIS — C5701 Malignant neoplasm of right fallopian tube: Secondary | ICD-10-CM | POA: Diagnosis not present

## 2021-06-02 DIAGNOSIS — Z923 Personal history of irradiation: Secondary | ICD-10-CM | POA: Insufficient documentation

## 2021-06-02 DIAGNOSIS — Z171 Estrogen receptor negative status [ER-]: Secondary | ICD-10-CM

## 2021-06-02 DIAGNOSIS — Z7984 Long term (current) use of oral hypoglycemic drugs: Secondary | ICD-10-CM | POA: Insufficient documentation

## 2021-06-02 LAB — CBC WITH DIFFERENTIAL (CANCER CENTER ONLY)
Abs Immature Granulocytes: 0.01 10*3/uL (ref 0.00–0.07)
Basophils Absolute: 0 10*3/uL (ref 0.0–0.1)
Basophils Relative: 0 %
Eosinophils Absolute: 0.1 10*3/uL (ref 0.0–0.5)
Eosinophils Relative: 1 %
HCT: 30.8 % — ABNORMAL LOW (ref 36.0–46.0)
Hemoglobin: 10.1 g/dL — ABNORMAL LOW (ref 12.0–15.0)
Immature Granulocytes: 0 %
Lymphocytes Relative: 19 %
Lymphs Abs: 1.1 10*3/uL (ref 0.7–4.0)
MCH: 34.7 pg — ABNORMAL HIGH (ref 26.0–34.0)
MCHC: 32.8 g/dL (ref 30.0–36.0)
MCV: 105.8 fL — ABNORMAL HIGH (ref 80.0–100.0)
Monocytes Absolute: 0.4 10*3/uL (ref 0.1–1.0)
Monocytes Relative: 7 %
Neutro Abs: 4 10*3/uL (ref 1.7–7.7)
Neutrophils Relative %: 73 %
Platelet Count: 53 10*3/uL — ABNORMAL LOW (ref 150–400)
RBC: 2.91 MIL/uL — ABNORMAL LOW (ref 3.87–5.11)
RDW: 14.5 % (ref 11.5–15.5)
WBC Count: 5.4 10*3/uL (ref 4.0–10.5)
nRBC: 0 % (ref 0.0–0.2)

## 2021-06-02 LAB — CMP (CANCER CENTER ONLY)
ALT: 22 U/L (ref 0–44)
AST: 17 U/L (ref 15–41)
Albumin: 3.6 g/dL (ref 3.5–5.0)
Alkaline Phosphatase: 72 U/L (ref 38–126)
Anion gap: 10 (ref 5–15)
BUN: 9 mg/dL (ref 6–20)
CO2: 25 mmol/L (ref 22–32)
Calcium: 9 mg/dL (ref 8.9–10.3)
Chloride: 107 mmol/L (ref 98–111)
Creatinine: 0.67 mg/dL (ref 0.44–1.00)
GFR, Estimated: >60 mL/min (ref >60)
Glucose, Bld: 114 mg/dL — ABNORMAL HIGH (ref 70–99)
Potassium: 3.8 mmol/L (ref 3.5–5.1)
Sodium: 142 mmol/L (ref 135–145)
Total Bilirubin: 0.4 mg/dL (ref 0.3–1.2)
Total Protein: 7.3 g/dL (ref 6.5–8.1)

## 2021-06-02 MED ORDER — DENOSUMAB 120 MG/1.7ML ~~LOC~~ SOLN
120.0000 mg | Freq: Once | SUBCUTANEOUS | Status: AC
  Administered 2021-06-02: 120 mg via SUBCUTANEOUS
  Filled 2021-06-02: qty 1.7

## 2021-06-02 MED ORDER — HEPARIN SOD (PORK) LOCK FLUSH 100 UNIT/ML IV SOLN
500.0000 [IU] | Freq: Once | INTRAVENOUS | Status: AC | PRN
  Administered 2021-06-02: 500 [IU]

## 2021-06-02 MED ORDER — SODIUM CHLORIDE 0.9% FLUSH
10.0000 mL | INTRAVENOUS | Status: DC | PRN
  Administered 2021-06-02: 10 mL

## 2021-06-02 NOTE — Assessment & Plan Note (Signed)
Left lumpectomy: IDC grade 3, 1.2 cm, DCIS is present, margins negative, 0/6 lymph nodes negative, ER 0%, PR 0%, HER-2 positive ratio 2.28, Ki-67 20%, T1 BN 0 stage IA S/P Taxol-Herceptin BRCA 2 Mutation Positive  10/20/19:Bone mets involving Right hip, Bil Iliac bones, T8, T9, L2 and L3, Rt prox humerus, Righ glenoid. Some lytic and some sclerotic. Mild Left supraclav adenopathy, 2 small lesions basal ganglia (could be lacunar infarcts)  Biopsy the supra clav LN: 10/31/2019: Metastatic breast cancer, ER/PR negative HER-2 equivocal by IHC, FISHpositive Treatmentsummary: Kadcyla every 3-week treatment.Discontinued for progression  MRI of the back 01/07/2020: Extensive metastatic disease to the involved spine. Tumor replacement of L3 vertebral body with pathologic compression fracture, epidural and extraosseous extension. Tumor extending throughout L3 foramen Status post palliative radiation therapyCausing severe dysphagia and dehydration and hospitalization5/17/2021-02/03/2020 ------------------------------------------------------------------------------------------------------------------------------------------------------ Echocardiogram 11/14/2019 Current treatment:Talazoparib started 02/07/2020  Toxicities: Occasional mild nausea Esophagitis: I discussed with her if it is significant we can refer her to gastroenterology. She will watch and monitor for now. She will avoid eating late at night. Monitoring closely for cytopenias. Anemia: Hemoglobinimproved to9.7 Thrombocytopenia:Platelets are131today.  Currentdosage of talazoparib to0.5 mgstarted 09/20/2020 CT CAP 09/20/19: No evidence of ST mets, tiny inferior right hepatic lesion substantially decreased(3 mm), bone mets have become sclerotic suggest healing CT CAP 12/26/2020: Stable diffuse bone mets  Recent fall and injury to the back: X-rays: Negative for acute fractures, bone metastases Plan: CT chest abdomen pelvis in  October and follow-up after that

## 2021-06-13 ENCOUNTER — Other Ambulatory Visit (HOSPITAL_COMMUNITY): Payer: Self-pay

## 2021-06-16 ENCOUNTER — Other Ambulatory Visit (HOSPITAL_COMMUNITY): Payer: Self-pay

## 2021-06-24 ENCOUNTER — Encounter: Payer: Self-pay | Admitting: Hematology and Oncology

## 2021-06-27 ENCOUNTER — Encounter: Payer: Self-pay | Admitting: Hematology and Oncology

## 2021-06-30 ENCOUNTER — Encounter: Payer: Self-pay | Admitting: Hematology and Oncology

## 2021-07-01 ENCOUNTER — Ambulatory Visit (HOSPITAL_COMMUNITY)
Admission: RE | Admit: 2021-07-01 | Discharge: 2021-07-01 | Disposition: A | Discharge status: Home / Self Care | Payer: Medicare Other | Source: Ambulatory Visit | Attending: Hematology and Oncology | Admitting: Hematology and Oncology

## 2021-07-01 DIAGNOSIS — J8410 Pulmonary fibrosis, unspecified: Secondary | ICD-10-CM | POA: Diagnosis not present

## 2021-07-01 DIAGNOSIS — C50919 Malignant neoplasm of unspecified site of unspecified female breast: Secondary | ICD-10-CM | POA: Diagnosis not present

## 2021-07-01 DIAGNOSIS — Z171 Estrogen receptor negative status [ER-]: Secondary | ICD-10-CM | POA: Diagnosis not present

## 2021-07-01 DIAGNOSIS — C50512 Malignant neoplasm of lower-outer quadrant of left female breast: Secondary | ICD-10-CM | POA: Insufficient documentation

## 2021-07-01 DIAGNOSIS — C5701 Malignant neoplasm of right fallopian tube: Secondary | ICD-10-CM | POA: Insufficient documentation

## 2021-07-01 DIAGNOSIS — H5213 Myopia, bilateral: Secondary | ICD-10-CM | POA: Diagnosis not present

## 2021-07-01 DIAGNOSIS — K449 Diaphragmatic hernia without obstruction or gangrene: Secondary | ICD-10-CM | POA: Diagnosis not present

## 2021-07-01 DIAGNOSIS — I700 Atherosclerosis of aorta: Secondary | ICD-10-CM | POA: Diagnosis not present

## 2021-07-01 DIAGNOSIS — K7689 Other specified diseases of liver: Secondary | ICD-10-CM | POA: Diagnosis not present

## 2021-07-01 MED ORDER — IOHEXOL 350 MG/ML SOLN
80.0000 mL | Freq: Once | INTRAVENOUS | Status: AC | PRN
  Administered 2021-07-01: 80 mL via INTRAVENOUS

## 2021-07-01 NOTE — Progress Notes (Signed)
Patient Care Team: [name], NP as PCP - General (Nurse Practitioner) Excell Seltzer, MD (Inactive) as Consulting Physician (General Surgery) [name], MD as Consulting Physician (Hematology and Oncology) Kyung Rudd, MD as Consulting Physician (Radiation Oncology) [name], MD as Consulting Physician (Gynecologic Oncology) Delice Bison, Charlestine Massed, NP as Nurse Practitioner (Hematology and Oncology) Mauro Kaufmann, RN as Oncology Nurse Navigator Rockwell Germany, RN as Oncology Nurse Navigator  DIAGNOSIS:    ICD-10-CM   1. Metastases to the liver [facility])  C78.7 IR Radiologist Eval & Mgmt    MR Brain W Wo Contrast    2. Malignant neoplasm of lower-outer quadrant of left breast of female, estrogen receptor negative ([place])  C50.512 IR Radiologist Eval & Mgmt   Z17.1 MR Brain W Wo Contrast      SUMMARY OF ONCOLOGIC HISTORY: Oncology History  Malignant neoplasm of lower-outer quadrant of left breast of female, estrogen receptor negative ([place])  05/11/2017 Initial Diagnosis   Left breast biopsy 3:30 position 6 cm from nipple: IDC with DCIS, lymphovascular invasion present, grade 2-3, ER 0%, PR 0%, HER-2 positive ratio 2.28, Ki-67 20%, 1 cm lesion left breast, T1b N0 stage IA clinical stage   06/09/2017 Surgery   Left lumpectomy: IDC grade 3, 1.2 cm, DCIS is present, margins negative, 0/6 lymph nodes negative, ER 0%, PR 0%, HER-2 positive ratio 2.28, Ki-67 20%, T1 BN 0 stage IA   07/09/2017 - 09/29/2017 Adjuvant Chemotherapy   Taxol/Herceptin x 12 completed on 09/29/2017, went on to receive every 3 week adjuvant Herceptin through 06/28/2018   11/25/2017 - 01/07/2018 Radiation Therapy    The patient initially received a dose of 50.4 Gy in 28 fractions to the breast using whole-breast tangent fields. This was delivered using a 3-D conformal technique. The patient then received a boost to the seroma. This delivered an additional 10 Gy in 5 fractions using a 3-D technique. The  total dose was 60.4 Gy.   02/15/2018 Genetic Testing   The Common Hereditary Cancer Panel offered by [facility] includes sequencing and/or deletion duplication testing of the following 47 genes: APC, ATM, AXIN2, BARD1, BMPR1A, BRCA1, BRCA2, BRIP1, CDH1, CDKN2A (p14ARF), CDKN2A (p16INK4a), CKD4, CHEK2, CTNNA1, DICER1, EPCAM (Deletion/duplication testing only), GREM1 (promoter region deletion/duplication testing only), KIT, MEN1, MLH1, MSH2, MSH3, MSH6, MUTYH, NBN, NF1, NHTL1, PALB2, PDGFRA, PMS2, POLD1, POLE, PTEN, RAD50, RAD51C, RAD51D, SDHB, SDHC, SDHD, SMAD4, SMARCA4. STK11, TP53, TSC1, TSC2, and VHL.  The following genes were evaluated for sequence changes only: SDHA and HOXB13 c.251G>A variant only.   Results: POSITIVE. 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 The date of this test report is 02/15/2018   10/20/2019 PET scan   Bone mets involving Right hip, Bil Iliac bones, T8, T9, L2 and L3, Rt prox humerus, Righ glenoid. Some lytic and some sclerotic. Mild Left supraclav adenopathy, 2 small lesions basal ganglia (could be lacunar infarcts)   11/14/2019 - 12/26/2019 Chemotherapy   The patient had palonosetron (ALOXI) injection 0.25 mg, 0.25 mg, Intravenous,  Once, 2 of 5 cycles Administration: 0.25 mg (12/05/2019), 0.25 mg (12/26/2019) ado-trastuzumab emtansine (KADCYLA) 260 mg in sodium chloride 0.9 % 250 mL chemo infusion, 3.6 mg/kg = 260 mg, Intravenous, Once, 3 of 6 cycles Administration: 260 mg (11/14/2019), 260 mg (12/05/2019), 260 mg (12/26/2019)   for chemotherapy treatment.     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  Required TPN briefly.  Patient was able to swallow and keep food down by the time of discharge.   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 Some lytic and some sclerotic. Mild Left supraclav adenopathy, 2 small lesions basal ganglia (could be lacunar infarcts)   11/14/2019 - 12/26/2019 Chemotherapy   The patient had palonosetron (ALOXI) injection 0.25 mg, 0.25 mg, Intravenous,  Once, 2 of 5 cycles Administration: 0.25 mg (12/05/2019), 0.25 mg (12/26/2019) ado-trastuzumab emtansine (KADCYLA) 260 mg in sodium chloride 0.9 % 250 mL chemo infusion, 3.6 mg/kg = 260 mg, Intravenous, Once, 3 of 6 cycles Administration: 260 mg (11/14/2019), 260 mg (12/05/2019), 260 mg (12/26/2019)   for chemotherapy treatment.       CHIEF COMPLIANT: Follow-up of metastatic breast cancer  INTERVAL HISTORY: Erin [name] is a 60 y.o. with above-mentioned history of metastatic breast cancer who is currently on talazoparib and Xgeva. She presents to the clinic today for a toxicity check.  She is otherwise feeling well without any problems or concerns.  She is complaining of imbalance issues.  She has fallen once.  Does not have any dizziness or headaches.  ALLERGIES:  has No Known Allergies.  MEDICATIONS:  Current Outpatient Medications  Medication Sig Dispense Refill   Accu-Chek FastClix Lancets MISC Use as instructed. Inject into the skin twice daily 100 each 3   atorvastatin (LIPITOR) 40 MG tablet Take 1 tablet (40 mg total) by mouth daily. 90 tablet 1   Blood Glucose Calibration (ACCU-CHEK GUIDE CONTROL) LIQD 1 each by In Vitro route once as needed for up to 1 dose. 1 each 0   cholecalciferol (VITAMIN D) 1000 units tablet Take 1,000 Units by mouth daily.      glucose blood (ACCU-CHEK GUIDE) test strip Use as instructed. Check blood glucose by fingerstick twice per day. 100 each 12   Lancet Devices (ACCU-CHEK SOFTCLIX) lancets Use as instructed 1 each 0   lidocaine-prilocaine (EMLA) cream Apply 1 application topically as needed. 30 g 5   metFORMIN (GLUCOPHAGE) 500 MG tablet TAKE 2 TABLETS BY MOUTH 2 (TWO) TIMES DAILY WITH A MEAL. 360 tablet 1   ondansetron (ZOFRAN ODT) 4 MG disintegrating tablet Take 1 tablet (4 mg total) by mouth every 8 (eight) hours as needed for nausea or vomiting. 30 tablet 0   talazoparib tosylate (TALZENNA) 0.25 MG capsule Take 2 capsules (0.5 mg total) by mouth daily. 60 capsule 1   No current facility-administered medications for this visit.   Facility-Administered Medications Ordered in Other Visits  Medication Dose Route Frequency Provider Last Rate Last Admin   heparin lock flush 100 unit/mL  500 Units Intracatheter Once PRN [name], MD       sodium chloride flush (NS) 0.9 % injection 10 mL  10 mL Intracatheter PRN [name], MD        PHYSICAL EXAMINATION: ECOG PERFORMANCE STATUS: 1 - Symptomatic but completely ambulatory  Vitals:   07/02/21 1121  BP: (!) 145/65  Pulse: 95  Resp: 18  Temp: (!)  97.3 F (36.3 C)  SpO2: 100%   Filed Weights   07/02/21 1121  Weight: 144 lb 14.4 oz (65.7 kg)     LABORATORY DATA:  I have reviewed the data as listed CMP Latest Ref Rng & Units 06/02/2021 04/04/2021 02/25/2021  Glucose 70 - 99 mg/dL 114(H) 95 101(H)  BUN 6 - 20 mg/dL _0 Creatinine 0.44 - 1.00 mg/dL 0.67 0.57 0.66  Sodium 135 - 145 mmol/L 142 141 142  Potassium 3.5 - 5.1 mmol/L 3.8 3.8 4.1  Chloride 98 - 111 mmol/L 107 102 108  CO2 22 - 32 mmol/L _1 Calcium 8.9 - 10.3 mg/dL 9.0 9.3 9.5  Total Protein 6.5 - 8.1 g/dL 7.3 7.2 7.6  Total Bilirubin 0.3 - 1.2 mg/dL 0.4 0.3 0.3  Alkaline Phos 38 - 126 U/L 72 89 72  AST 15 - 41 U/L _2 ALT 0 - 44 U/L _3 Lab Results  Component  Value Date   WBC 5.4 06/02/2021   HGB 10.1 (L) 06/02/2021   HCT 30.8 (L) 06/02/2021   MCV 105.8 (H) 06/02/2021   PLT 53 (L) 06/02/2021   NEUTROABS 4.0 06/02/2021    ASSESSMENT & PLAN:  Malignant neoplasm of lower-outer quadrant of left breast of female, estrogen receptor negative (H[facility]) Left lumpectomy: IDC grade 3, 1.2 cm, DCIS is present, margins negative, 0/6 lymph nodes negative, ER 0%, PR 0%, HER-2 positive ratio 2.28, Ki-67 20%, T1 BN 0 stage IA S/P Taxol-Herceptin BRCA 2 Mutation Positive   10/20/19: Bone mets involving Right hip, Bil Iliac bones, T8, T9, L2 and L3, Rt prox humerus, Righ glenoid. Some lytic and some sclerotic. Mild Left supraclav adenopathy, 2 small lesions basal ganglia (could be lacunar infarcts)   Biopsy the supra clav LN: 10/31/2019: Metastatic breast cancer, ER/PR negative HER-2 equivocal by IHC, FISH positive Treatment summary: Kadcyla every 3-week treatment.  Discontinued for progression   MRI of the back 01/07/2020: Extensive metastatic disease to the involved spine.  Tumor replacement of L3 vertebral body with pathologic compression fracture, epidural and extraosseous extension.  Tumor extending throughout L3 foramen Status post palliative radiation therapy Causing severe dysphagia and dehydration and hospitalization 01/29/2020-02/03/2020 ------------------------------------------------------------------------------------------------------------------------------------------------------ Echocardiogram 11/14/2019 Current treatment: Talazoparib started 02/07/2020   Toxicities: Occasional mild nausea   Monitoring closely for cytopenias. Anemia:   Thrombocytopenia:    Current dosage of talazoparib to 0.5 mg started 09/20/2020 CT CAP 09/20/19: No evidence of ST mets, tiny inferior right hepatic lesion substantially decreased (3 mm), bone mets have become sclerotic suggest healing CT CAP 12/26/2020: Stable diffuse bone mets CT CAP 07/01/2021: Stable bone metastases.   New liver lesion 1.5 cm   Recommendation: I consulted interventional radiology and I spoke with Dr. [name].  She may be a candidate for liver ablation procedure.  We will schedule an appointment for her to see interventional radiology for consultation. Our plan is to continue with talazoparib at the same dosage.  Imbalance issues: We will set up for a brain MRI. Return to clinic after brain MRI to discuss results.      Orders Placed This Encounter  Procedures   IR Radiologist Eval & Mgmt    Standing Status:   Future    Standing Expiration Date:   07/02/2022    Order Specific Question:   Reason for Exam (SYMPTOM  OR DIAGNOSIS REQUIRED)    Answer:   Evaluate for ambulation: Liver met    Order Specific Question:  Is the patient pregnant?    Answer:   No    Order Specific Question:   Preferred Imaging Location?    Answer:   [facility]    Order Specific Question:   Release to patient    Answer:   Immediate   MR Brain W Wo Contrast    Standing Status:   Future    Standing Expiration Date:   07/02/2022    Order Specific Question:   If indicated for the ordered procedure, I authorize the administration of contrast media per Radiology protocol    Answer:   Yes    Order Specific Question:   What is the patient's sedation requirement?    Answer:   No Sedation    Order Specific Question:   Does the patient have a pacemaker or implanted devices?    Answer:   No    Order Specific Question:   Use SRS Protocol?    Answer:   Yes    Order Specific Question:   Preferred imaging location?    Answer:   [facility] (table limit - 550 lbs)    Order Specific Question:   Release to patient    Answer:   Immediate   The patient has a good understanding of the overall plan. she agrees with it. she will call with any problems that may develop before the next visit here.  Total time spent: 30 mins including face to face time and time spent for planning, charting and coordination of  care  Rulon Eisenmenger, MD, MPH 07/02/2021  I, Thana Ates, am acting as scribe for [name].  I have reviewed the above documentation for accuracy and completeness, and I agree with the above.

## 2021-07-02 ENCOUNTER — Other Ambulatory Visit: Payer: Self-pay

## 2021-07-02 ENCOUNTER — Inpatient Hospital Stay: Payer: Medicaid Other | Attending: Gynecologic Oncology | Admitting: Hematology and Oncology

## 2021-07-02 VITALS — BP 145/65 | HR 95 | Temp 97.3°F | Resp 18 | Ht 61.0 in | Wt 144.9 lb

## 2021-07-02 DIAGNOSIS — D696 Thrombocytopenia, unspecified: Secondary | ICD-10-CM | POA: Diagnosis not present

## 2021-07-02 DIAGNOSIS — Z9221 Personal history of antineoplastic chemotherapy: Secondary | ICD-10-CM | POA: Diagnosis not present

## 2021-07-02 DIAGNOSIS — C7949 Secondary malignant neoplasm of other parts of nervous system: Secondary | ICD-10-CM | POA: Diagnosis not present

## 2021-07-02 DIAGNOSIS — D649 Anemia, unspecified: Secondary | ICD-10-CM | POA: Diagnosis not present

## 2021-07-02 DIAGNOSIS — C7951 Secondary malignant neoplasm of bone: Secondary | ICD-10-CM | POA: Insufficient documentation

## 2021-07-02 DIAGNOSIS — C787 Secondary malignant neoplasm of liver and intrahepatic bile duct: Secondary | ICD-10-CM | POA: Diagnosis not present

## 2021-07-02 DIAGNOSIS — C5701 Malignant neoplasm of right fallopian tube: Secondary | ICD-10-CM | POA: Diagnosis not present

## 2021-07-02 DIAGNOSIS — Z171 Estrogen receptor negative status [ER-]: Secondary | ICD-10-CM | POA: Diagnosis not present

## 2021-07-02 DIAGNOSIS — Z7984 Long term (current) use of oral hypoglycemic drugs: Secondary | ICD-10-CM | POA: Insufficient documentation

## 2021-07-02 DIAGNOSIS — C50512 Malignant neoplasm of lower-outer quadrant of left female breast: Secondary | ICD-10-CM | POA: Diagnosis not present

## 2021-07-02 DIAGNOSIS — Z923 Personal history of irradiation: Secondary | ICD-10-CM | POA: Diagnosis not present

## 2021-07-02 DIAGNOSIS — Z79899 Other long term (current) drug therapy: Secondary | ICD-10-CM | POA: Diagnosis not present

## 2021-07-02 NOTE — Assessment & Plan Note (Signed)
Left lumpectomy: IDC grade 3, 1.2 cm, DCIS is present, margins negative, 0/6 lymph nodes negative, ER 0%, PR 0%, HER-2 positive ratio 2.28, Ki-67 20%, T1 BN 0 stage IA S/P Taxol-Herceptin BRCA 2 Mutation Positive  10/20/19:Bone mets involving Right hip, Bil Iliac bones, T8, T9, L2 and L3, Rt prox humerus, Righ glenoid. Some lytic and some sclerotic. Mild Left supraclav adenopathy, 2 small lesions basal ganglia (could be lacunar infarcts)  Biopsy the supra clav LN: 10/31/2019: Metastatic breast cancer, ER/PR negative HER-2 equivocal by IHC, FISHpositive Treatmentsummary: Kadcyla every 3-week treatment.Discontinued for progression  MRI of the back 01/07/2020: Extensive metastatic disease to the involved spine. Tumor replacement of L3 vertebral body with pathologic compression fracture, epidural and extraosseous extension. Tumor extending throughout L3 foramen Status post palliative radiation therapyCausing severe dysphagia and dehydration and hospitalization5/17/2021-02/03/2020 ------------------------------------------------------------------------------------------------------------------------------------------------------ Echocardiogram 11/14/2019 Current treatment:Talazoparib started 02/07/2020  Toxicities: Occasional mild nausea Esophagitis: Sometimes it becomes difficult to swallow because of the Monitoring closely for cytopenias. Anemia: Hemoglobinimproved to10.1 Thrombocytopenia:Platelets are53today.  Currentdosage of talazoparib to0.5 mgstarted 09/20/2020 CT CAP 09/20/19: No evidence of ST mets, tiny inferior right hepatic lesion substantially decreased(3 mm), bone mets have become sclerotic suggest healing CT CAP 12/26/2020:Stable diffuse bone mets  Recent fall and injury to the back: X-rays: Negative for acute fractures, bone metastases Plan: CT chest abdomen pelvis in October and follow-up after that

## 2021-07-04 ENCOUNTER — Ambulatory Visit: Payer: Medicaid Other | Admitting: Hematology and Oncology

## 2021-07-04 ENCOUNTER — Ambulatory Visit: Payer: Self-pay | Admitting: *Deleted

## 2021-07-04 NOTE — Telephone Encounter (Signed)
Summary: nausea concerns   The patient has been experiencing nausea since Tuesday 07/01/21   The patient believes it is a side effect of their metformin and has not taken the medication since Tuesday   The patient is continuing to experience nausea and general discomfort   Please contact when possible      Answer Assessment - Initial Assessment Questions 1. NAME of MEDICATION: "What medicine are you calling about?"     Metformin 2. QUESTION: "What is your question?" (e.g., double dose of medicine, side effect)     Patient is experiencing nausea and feels that is the metformin that is causing the nausea. Patient is requesting a different medication 3. PRESCRIBING HCP: "Who prescribed it?" Reason: if prescribed by specialist, call should be referred to that group.     PCP 4. SYMPTOMS: "Do you have any symptoms?"     Nausea at night 5. SEVERITY: If symptoms are present, ask "Are they mild, moderate or severe?"     Severe- patient does not want to take her medication 6. PREGNANCY:  "Is there any chance that you are pregnant?" "When was your last menstrual period?"     na  Protocols used: Medication Question Call-A-AH

## 2021-07-04 NOTE — Telephone Encounter (Signed)
Call to patient- using sign language interpreter #[id number] Patient states she started getting nausea Tuesday night after taking metformin dose and she is afraid to take it again- she does not want to get sick. Patient has been taking metformin for a long time without problem- but feels that it did cause her to get nauseated and vomit. Patient does not check her glucose levels- and she was advised she needs to monitor them- especially if she is not taking her medication.Patient states she will check levels and call office if elevated over 200. Patient advised I would send her request for change of medication to PCP- but that her provider may want to see her in the office before changing medication- will have office call her with PCP response. I also asked patient if she had anti nausea medication- she states she does- but has not taken it.

## 2021-07-06 NOTE — Telephone Encounter (Signed)
She can try to take 1 tablet twice a day instead of 2 tablets twice a day. She needs to make sure she takes this with a full meal as well.

## 2021-07-07 ENCOUNTER — Telehealth: Payer: Self-pay | Admitting: *Deleted

## 2021-07-07 NOTE — Telephone Encounter (Signed)
Copied from [place] [id number]. Topic: General - Other >> Jul 04, 2021 12:28 PM Tessa Lerner A wrote: Reason for CRM: The patient shares that their metFORMIN (GLUCOPHAGE) 500 MG tablet [[id number]]  has made them nauseous   The patient has not taken the medication since Tuesday 07/01/21  The patient would like to be prescribed an alternative   Please contact further

## 2021-07-09 ENCOUNTER — Other Ambulatory Visit: Payer: Self-pay

## 2021-07-09 ENCOUNTER — Ambulatory Visit
Admission: RE | Admit: 2021-07-09 | Discharge: 2021-07-09 | Disposition: A | Discharge status: Home / Self Care | Payer: Medicaid Other | Source: Ambulatory Visit | Attending: Hematology and Oncology | Admitting: Hematology and Oncology

## 2021-07-09 ENCOUNTER — Encounter: Payer: Self-pay | Admitting: *Deleted

## 2021-07-09 DIAGNOSIS — C50512 Malignant neoplasm of lower-outer quadrant of left female breast: Secondary | ICD-10-CM | POA: Diagnosis not present

## 2021-07-09 DIAGNOSIS — C787 Secondary malignant neoplasm of liver and intrahepatic bile duct: Secondary | ICD-10-CM

## 2021-07-09 DIAGNOSIS — Z171 Estrogen receptor negative status [ER-]: Secondary | ICD-10-CM

## 2021-07-09 HISTORY — PX: IR RADIOLOGIST EVAL & MGMT: IMG5224

## 2021-07-09 NOTE — Consult Note (Signed)
Chief Complaint: Patient was consulted remotely today (TeleHealth) for possible percutaneous ablation of a liver lesion at the request of [facility] [place].    Referring Physician(s): Gudena,Vinay  History of Present Illness: Erin [name] is a 60 y.o. female with the following oncologic history per Dr. [name]:  SUMMARY OF ONCOLOGIC HISTORY:    Oncology History  Malignant neoplasm of lower-outer quadrant of left breast of female, estrogen receptor negative ([place])  05/11/2017 Initial Diagnosis    Left breast biopsy 3:30 position 6 cm from nipple: IDC with DCIS, lymphovascular invasion present, grade 2-3, ER 0%, PR 0%, HER-2 positive ratio 2.28, Ki-67 20%, 1 cm lesion left breast, T1b N0 stage IA clinical stage    06/09/2017 Surgery    Left lumpectomy: IDC grade 3, 1.2 cm, DCIS is present, margins negative, 0/6 lymph nodes negative, ER 0%, PR 0%, HER-2 positive ratio 2.28, Ki-67 20%, T1 BN 0 stage IA    07/09/2017 - 09/29/2017 Adjuvant Chemotherapy    Taxol/Herceptin x 12 completed on 09/29/2017, went on to receive every 3 week adjuvant Herceptin through 06/28/2018    11/25/2017 - 01/07/2018 Radiation Therapy     The patient initially received a dose of 50.4 Gy in 28 fractions to the breast using whole-breast tangent fields. This was delivered using a 3-D conformal technique. The patient then received a boost to the seroma. This delivered an additional 10 Gy in 5 fractions using a 3-D technique. The total dose was 60.4 Gy.    02/15/2018 Genetic Testing    The Common Hereditary Cancer Panel offered by [facility] includes sequencing and/or deletion duplication testing of the following 47 genes: APC, ATM, AXIN2, BARD1, BMPR1A, BRCA1, BRCA2, BRIP1, CDH1, CDKN2A (p14ARF), CDKN2A (p16INK4a), CKD4, CHEK2, CTNNA1, DICER1, EPCAM (Deletion/duplication testing only), GREM1 (promoter region deletion/duplication testing only), KIT, MEN1, MLH1, MSH2, MSH3, MSH6, MUTYH, NBN, NF1, NHTL1, PALB2, PDGFRA, PMS2, POLD1,  POLE, PTEN, RAD50, RAD51C, RAD51D, SDHB, SDHC, SDHD, SMAD4, SMARCA4. STK11, TP53, TSC1, TSC2, and VHL.  The following genes were evaluated for sequence changes only: SDHA and HOXB13 c.251G>A variant only.    Results: POSITIVE. 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 The date of this test report is 02/15/2018    10/20/2019 PET scan    Bone mets involving Right hip, Bil Iliac bones, T8, T9, L2 and L3, Rt prox humerus, Righ glenoid. Some lytic and some sclerotic. Mild Left supraclav adenopathy, 2 small lesions basal ganglia (could be lacunar infarcts)    11/14/2019 - 12/26/2019 Chemotherapy    The patient had palonosetron (ALOXI) injection 0.25 mg, 0.25 mg, Intravenous,  Once, 2 of 5 cycles Administration: 0.25 mg (12/05/2019), 0.25 mg (12/26/2019) ado-trastuzumab emtansine (KADCYLA) 260 mg in sodium chloride 0.9 % 250 mL chemo infusion, 3.6 mg/kg = 260 mg, Intravenous, Once, 3 of 6 cycles Administration: 260 mg (11/14/2019), 260 mg (12/05/2019), 260 mg (12/26/2019)    for chemotherapy treatment.       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  Required TPN briefly.  Patient was able to swallow and keep food down by the time of discharge.    02/06/2020 Miscellaneous    Talazoparib    Fallopian tube cancer, carcinoma, right ([place])  11/04/2017 Surgery    Uterus, cervix and bilateral fallopian tubes: Fallopian tube with serous carcinoma 0.6 cm positive for p53, PAX8, WT-1, MOC-31, cytokeratin 7, and estrogen receptor  01/11/2018 -  Chemotherapy    Taxol and carboplatin every 3 weeks (Taxol discontinued with cycle 2 because of profound rash)    10/20/2019 PET scan    Bone mets involving Right hip, Bil Iliac bones, T8, T9, L2 and L3, Rt prox humerus, Righ glenoid. Some lytic and some sclerotic. Mild Left supraclav adenopathy, 2 small lesions  basal ganglia (could be lacunar infarcts)    11/14/2019 - 12/26/2019 Chemotherapy    The patient had palonosetron (ALOXI) injection 0.25 mg, 0.25 mg, Intravenous,  Once, 2 of 5 cycles Administration: 0.25 mg (12/05/2019), 0.25 mg (12/26/2019) ado-trastuzumab emtansine (KADCYLA) 260 mg in sodium chloride 0.9 % 250 mL chemo infusion, 3.6 mg/kg = 260 mg, Intravenous, Once, 3 of 6 cycles Administration: 260 mg (11/14/2019), 260 mg (12/05/2019), 260 mg (12/26/2019)    for chemotherapy treatment.    She is known to me from a prior ultrasound guided biopsy of left supraclavicular recurrence of breast carcinoma on 10/31/2019. She had some small liver lesions in April, 2021 by CT that responded to therapy and were not present in January or April, 2022. Recent restaging CT on 07/01/2021 demonstrates a new 1.5 cm lesion in the medial aspect of segment V of the right lobe of the liver. She otherwise has stable bone metastases. She is scheduled for MRI of the brain tomorrow morning.   Past Medical History:  Diagnosis Date   Arthritis    knees, elbows   Borderline glaucoma of right eye    BRCA2 gene mutation positive 02/17/2018   BRCA2 c.4552del (p.Glu1518Asnfs*25)  Result reported out on 02/15/2018.    Breast cancer ([place]) 05/2017   left   Chronic back pain    Deaf    per pt born hearing and at age 3 1/2 lost hearing , was told by mother unknown cause, can miminally hear in left and no hearing on right    Depression    Dyspnea    states some SOB with ADLs and anemia    Elevated cancer antigen 125 (CA 125)    Family history of breast cancer    Family history of breast cancer    Family history of colon cancer    Genetic testing 02/17/2018   The Common Hereditary Cancer Panel offered by [facility] includes sequencing and/or deletion duplication testing of the following 47 genes: APC, ATM, AXIN2, BARD1, BMPR1A, BRCA1, BRCA2, BRIP1, CDH1, CDKN2A (p14ARF), CDKN2A (p16INK4a), CKD4, CHEK2, CTNNA1, DICER1, EPCAM  (Deletion/duplication testing only), GREM1 (promoter region deletion/duplication testing only), KIT, MEN1, MLH1, MSH2, MSH3, MSH6, MU   History of cancer chemotherapy    left breast cancer 09-08-2017 to 10-09-2017;  fallopion tube cancer  12-15-2017  to 06-28-2018   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 oncologist-  dr [name]   11-04-2017  s/p  LAVH w/ BSO,  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;  completed chemo 06-28-2018   History of external beam radiation therapy    left breast  11-25-2017  to 01-07-2018   Hyperlipidemia    Hypertension    followed by pcp   (10-05-2019  per pt never had stress test)   IDA (iron deficiency anemia)    Malignant neoplasm of lower-outer quadrant of left breast of female, estrogen receptor negative [facility]) oncologist-- dr [name]   dx 08/ 2018--- Stage IA, DCIS,  ER/ PR negative,  HER-2 positive;  06-09-2017 s/p left breast lumpectomy with node dissection;  completed chemo 10-09-2017  and radiation 01-07-2018/  hercepton completed 06-28-2018   Non-insulin dependent type 2 diabetes mellitus ([place])    followed by pcp   (10-05-2019 per pt check cbg every other day in AM,  fasting cbg-- 105)   Numbness of right thumb    Wears glasses     Past Surgical History:  Procedure Laterality Date   BREAST LUMPECTOMY WITH RADIOACTIVE SEED AND SENTINEL LYMPH NODE BIOPSY Left 06/09/2017   Procedure: BREAST LUMPECTOMY WITH RADIOACTIVE SEED AND SENTINEL LYMPH NODE BIOPSY;  Surgeon: Excell Seltzer, MD;  Location: [facility];  Service: General;  Laterality: Left;   COLONOSCOPY  11/02/2017   polyps   HERNIA REPAIR     HIATAL HERNIA REPAIR  09/2019   LAPAROSCOPIC ASSISTED VAGINAL HYSTERECTOMY N/A 11/04/2017   Procedure: LAPAROSCOPIC ASSISTED VAGINAL HYSTERECTOMY;  Surgeon: [name], MD;  Location: [facility];  Service: Gynecology;  Laterality: N/A;   LAPAROSCOPIC BILATERAL SALPINGO OOPHERECTOMY Bilateral  11/04/2017   Procedure: LAPAROSCOPIC BILATERAL SALPINGO OOPHORECTOMY;  Surgeon: [name], MD;  Location: [facility];  Service: Gynecology;  Laterality: Bilateral;   LAPAROSCOPY N/A 10/09/2019   Procedure: LAPAROSCOPY DIAGNOSTIC WITH PERITONEAL BIOPSIES;  Surgeon: [name], MD;  Location: [facility];  Service: Gynecology;  Laterality: N/A;   PORTACATH PLACEMENT Right 06/09/2017   Procedure: INSERTION PORT-A-CATH WITH [place];  Surgeon: Excell Seltzer, MD;  Location: [facility];  Service: General;  Laterality: Right;   PORTACATH PLACEMENT Right 11/13/2019   Procedure: INSERTION PORT-A-CATH WITH ULTRASOUND GUIDANCE;  Surgeon: Rolm Bookbinder, MD;  Location: [facility];  Service: General;  Laterality: Right;   TUBAL LIGATION  02/02/2002   _0    PPTL   UPPER GI ENDOSCOPY      Allergies: Patient has no known allergies.  Medications: Prior to Admission medications   Medication Sig Start Date End Date Taking? Authorizing Provider  Accu-Chek FastClix Lancets MISC Use as instructed. Inject into the skin twice daily 11/18/20   [name], NP  atorvastatin (LIPITOR) 40 MG tablet Take 1 tablet (40 mg total) by mouth daily. 04/04/21   Scot Jun, FNP  Blood Glucose Calibration (ACCU-CHEK GUIDE CONTROL) LIQD 1 each by In Vitro route once as needed for up to 1 dose. 12/26/18   [name], NP  cholecalciferol (VITAMIN D) 1000 units tablet Take 1,000 Units by mouth daily.    [provider]  glucose blood (ACCU-CHEK GUIDE) test strip Use as instructed. Check blood glucose by fingerstick twice per day. 11/18/20   [name], NP  Lancet Devices [facility]) lancets Use as instructed 12/17/14   Lance Bosch, NP  lidocaine-prilocaine (EMLA) cream Apply 1 application topically as needed. 09/03/20   Tanner, Lyndon Code., PA-C  metFORMIN (GLUCOPHAGE) 500 MG tablet TAKE 2 TABLETS BY MOUTH 2 (TWO) TIMES DAILY WITH A MEAL. 10/28/20    [name], MD  ondansetron (ZOFRAN ODT) 4 MG disintegrating tablet Take 1 tablet (4 mg total) by mouth every 8 (eight) hours as needed for nausea or vomiting. 04/04/21   Scot Jun, FNP  talazoparib tosylate (TALZENNA) 0.25 MG capsule Take 2 capsules (0.5 mg total) by mouth daily. 05/13/21 05/13/22  [name], MD     Family History  Problem Relation Age of Onset   Hypertension Mother    Lupus Mother    Diabetes Father    Hypertension Sister    Diabetes Paternal Grandmother    Colon cancer Paternal Grandmother 44   CAD Brother  Diabetes Brother    Heart attack Brother    Breast cancer Maternal Aunt        dx >50   Heart attack Paternal Grandfather    Breast cancer Maternal Aunt        dx under 73   Breast cancer Maternal Aunt        dx  under 7    Social History   Socioeconomic History   Marital status: Legally Separated    Spouse name: Not on file   Number of children: 2   Years of education: Not on file   Highest education level: Not on file  Occupational History   Not on file  Tobacco Use   Smoking status: Never   Smokeless tobacco: Never  Vaping Use   Vaping Use: Never used  Substance and Sexual Activity   Alcohol use: No   Drug use: Never   Sexual activity: Not on file  Other Topics Concern   Not on file  Social History Narrative   1 boy and 1 girl   Resides in [place]   Seperated   Social Determinants of Health   Financial Resource Strain: Not on file  Food Insecurity: Not on file  Transportation Needs: Not on file  Physical Activity: Not on file  Stress: Not on file  Social Connections: Not on file    Review of Systems  Constitutional: Negative.   Respiratory: Negative.    Cardiovascular: Negative.   Gastrointestinal: Negative.   Genitourinary: Negative.   Musculoskeletal: Negative.   Neurological: Negative.    Review of Systems: A 12 point ROS discussed and pertinent positives are indicated in the HPI above.  All other  systems are negative.  Physical Exam No direct physical exam was performed (except for noted visual exam findings with Video Visits).   Vital Signs: LMP 07/09/2017 (LMP Unknown)   Imaging: CT CHEST ABDOMEN PELVIS W CONTRAST  Result Date: 07/02/2021 CLINICAL DATA:  Metastatic breast cancer restaging EXAM: CT CHEST, ABDOMEN, AND PELVIS WITH CONTRAST TECHNIQUE: Multidetector CT imaging of the chest, abdomen and pelvis was performed following the standard protocol during bolus administration of intravenous contrast. CONTRAST:  33m OMNIPAQUE IOHEXOL 350 MG/ML SOLN, additional oral enteric contrast COMPARISON:  12/25/2020, 09/18/2020, 01/01/2020 FINDINGS: CT CHEST FINDINGS Cardiovascular: Right chest catheter aortic atherosclerosis. Normal heart size. No pericardial effusion. Mediastinum/Nodes: No enlarged mediastinal, hilar, or axillary lymph nodes. Small hiatal hernia. Thyroid gland, trachea, and esophagus demonstrate no significant findings. Lungs/Pleura: Paramedian fibrosis of the lower lobes (series 4, image 77). No pleural effusion or pneumothorax. Musculoskeletal: No chest wall mass. CT ABDOMEN PELVIS FINDINGS Hepatobiliary: There is a new hypodense lesion of the posterior right lobe of the liver, hepatic segment V, measuring 1.5 x 1.5 cm (series 2, image 48). No gallstones, gallbladder wall thickening, or biliary dilatation. Pancreas: Unremarkable. No pancreatic ductal dilatation or surrounding inflammatory changes. Spleen: Normal in size without significant abnormality. Adrenals/Urinary Tract: Adrenal glands are unremarkable. Kidneys are normal, without renal calculi, solid lesion, or hydronephrosis. Bladder is unremarkable. Stomach/Bowel: Stomach is within normal limits. Appendix appears normal. No evidence of bowel wall thickening, distention, or inflammatory changes. Vascular/Lymphatic: No significant vascular findings are present. No enlarged abdominal or pelvic lymph nodes. Reproductive: Status  post hysterectomy. Other: No abdominal wall hernia or abnormality. No abdominopelvic ascites. Musculoskeletal: Unchanged, widespread sclerotic osseous metastatic disease throughout. IMPRESSION: 1. There is a new hypodense lesion of the posterior right lobe of the liver, hepatic segment V, measuring 1.5 x 1.5 cm, consistent  with new hepatic metastatic disease. Note that this lesion is not at the site of previously visualized liver lesions. 2. Unchanged, widespread sclerotic osseous metastatic disease throughout. 3. No evidence of metastatic disease within the chest. Aortic Atherosclerosis (ICD10-I70.0). Electronically Signed   By: Delanna Ahmadi M.D.   On: 07/02/2021 11:00    Labs:  CBC: Recent Labs    02/25/21 0910 04/04/21 0920 04/11/21 0938 06/02/21 0854  WBC 4.8 5.2 3.1* 5.4  HGB 9.7* 10.9* 9.6* 10.1*  HCT 29.7* 31.5* 28.7* 30.8*  PLT 131* 65* 75* 53*    COAGS: No results for input(s): INR, APTT in the last 8760 hours.  BMP: Recent Labs    09/18/20 0930 11/14/20 0835 12/25/20 1312 02/25/21 1001 04/04/21 0920 06/02/21 0854  NA 142 138  --  142 141 142  K 3.8 3.9  --  4.1 3.8 3.8  CL 107 106  --  108 102 107  CO2 26 22  --  _0 GLUCOSE 101* 91  --  101* 95 114*  BUN 9 9  --  _1 CALCIUM 9.4 8.8*  --  9.5 9.3 9.0  CREATININE 0.71 0.67 0.50 0.66 0.57 0.67  GFRNONAA >60 >60  --  >60  --  >60    LIVER FUNCTION TESTS: Recent Labs    11/14/20 0835 02/25/21 1001 04/04/21 0920 06/02/21 0854  BILITOT <0.2* 0.3 0.3 0.4  AST _2 ALT _3 ALKPHOS 67 72 89 72  PROT 7.4 7.6 7.2 7.3  ALBUMIN 3.7 3.7 4.2 3.6     Assessment and Plan:  I spoke with Erin [name] by phone with the use of a sign language interpreter. By current CT, she appears to have a solitary metastasis in the liver that is approachable and treatable by size criteria and location to treatment with percutaneous microwave thermal ablation. I did recommend that we perform MRI of the  abdomen to make sure there is no other evidence of smaller metastatic lesions elsewhere in the liver, that could potentially be missed by a single phase CT study. She does not feel like she can tolerate a long MRI study without a sedative, and there would not be time to add an MR of the abdomen tomorrow at this point and call in a sedative prescription.   I discussed details of thermal ablation of the liver with Erin [name] and answered questions. The procedure would be performed at [facility] under general anesthesia and may involve overnight observation. I do not think separate biopsy is necessary given that this lesion is new in the last 6 months is clearly abnormal by CT, and consistent with metastatic disease from her breast carcinoma. If MRI confirms only one lesion, we can go ahead and schedule an ablation procedure, hopefully in the next few weeks.  Thank you for this interesting consult.  I greatly enjoyed meeting Erin [name] and look forward to participating in their care.  A copy of this report was sent to the requesting provider on this date.  Electronically Signed: Azzie Roup 07/09/2021, 11:46 AM   I spent a total of  30 Minutes  in remote  clinical consultation, greater than 50% of which was counseling/coordinating care for ablation of a liver metastasis.    Visit type: Audio only (telephone). Audio (no video) only due to patient's lack of internet/smartphone capability. Alternative for in-person consultation at [facility], [address], [place],  [place]. This visit type was conducted due to national recommendations for restrictions regarding the COVID-19 Pandemic (e.g. social distancing).  This format is felt to be most appropriate for this patient at this time.  All issues noted in this document were discussed and addressed.

## 2021-07-09 NOTE — Telephone Encounter (Signed)
Called pt call does not go trough.

## 2021-07-10 ENCOUNTER — Other Ambulatory Visit: Payer: Self-pay

## 2021-07-10 ENCOUNTER — Ambulatory Visit (HOSPITAL_COMMUNITY)
Admission: RE | Admit: 2021-07-10 | Discharge: 2021-07-10 | Disposition: A | Discharge status: Home / Self Care | Payer: Medicare Other | Source: Ambulatory Visit | Attending: Hematology and Oncology | Admitting: Hematology and Oncology

## 2021-07-10 ENCOUNTER — Other Ambulatory Visit (HOSPITAL_COMMUNITY): Payer: Self-pay

## 2021-07-10 ENCOUNTER — Other Ambulatory Visit: Payer: Self-pay | Admitting: Hematology and Oncology

## 2021-07-10 DIAGNOSIS — Z171 Estrogen receptor negative status [ER-]: Secondary | ICD-10-CM | POA: Diagnosis not present

## 2021-07-10 DIAGNOSIS — C787 Secondary malignant neoplasm of liver and intrahepatic bile duct: Secondary | ICD-10-CM | POA: Diagnosis not present

## 2021-07-10 DIAGNOSIS — C50512 Malignant neoplasm of lower-outer quadrant of left female breast: Secondary | ICD-10-CM | POA: Diagnosis not present

## 2021-07-10 DIAGNOSIS — C50919 Malignant neoplasm of unspecified site of unspecified female breast: Secondary | ICD-10-CM | POA: Diagnosis not present

## 2021-07-10 DIAGNOSIS — R2689 Other abnormalities of gait and mobility: Secondary | ICD-10-CM | POA: Diagnosis not present

## 2021-07-10 MED ORDER — GADOBUTROL 1 MMOL/ML IV SOLN
6.0000 mL | Freq: Once | INTRAVENOUS | Status: AC | PRN
  Administered 2021-07-10: 6 mL via INTRAVENOUS

## 2021-07-10 MED ORDER — TALAZOPARIB TOSYLATE 0.25 MG PO CAPS
0.5000 mg | ORAL_CAPSULE | Freq: Every day | ORAL | 1 refills | Status: DC
  Filled 2021-07-10: qty 60, 30d supply, fill #0

## 2021-07-11 ENCOUNTER — Other Ambulatory Visit (HOSPITAL_COMMUNITY): Payer: Self-pay

## 2021-07-11 ENCOUNTER — Other Ambulatory Visit: Payer: Self-pay | Admitting: Interventional Radiology

## 2021-07-11 DIAGNOSIS — C787 Secondary malignant neoplasm of liver and intrahepatic bile duct: Secondary | ICD-10-CM

## 2021-07-12 NOTE — Progress Notes (Signed)
Patient Care Team: [name], NP as PCP - General (Nurse Practitioner) Excell Seltzer, MD (Inactive) as Consulting Physician (General Surgery) [name], MD as Consulting Physician (Hematology and Oncology) Kyung Rudd, MD as Consulting Physician (Radiation Oncology) [name], MD as Consulting Physician (Gynecologic Oncology) Delice Bison, Charlestine Massed, NP as Nurse Practitioner (Hematology and Oncology) Mauro Kaufmann, RN as Oncology Nurse Navigator Rockwell Germany, RN as Oncology Nurse Navigator  DIAGNOSIS:    ICD-10-CM   1. 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       SUMMARY OF ONCOLOGIC HISTORY: Oncology History  Malignant neoplasm of lower-outer quadrant of left breast of female, estrogen receptor negative ([place])  05/11/2017 Initial Diagnosis   Left breast biopsy 3:30 position 6 cm from nipple: IDC with DCIS, lymphovascular invasion present, grade 2-3, ER 0%, PR 0%, HER-2 positive ratio 2.28, Ki-67 20%, 1 cm lesion left breast, T1b N0 stage IA clinical stage   06/09/2017 Surgery   Left lumpectomy: IDC grade 3, 1.2 cm, DCIS is present, margins negative, 0/6 lymph nodes negative, ER 0%, PR 0%, HER-2 positive ratio 2.28, Ki-67 20%, T1 BN 0 stage IA   07/09/2017 - 09/29/2017 Adjuvant Chemotherapy   Taxol/Herceptin x 12 completed on 09/29/2017, went on to receive every 3 week adjuvant Herceptin through 06/28/2018   11/25/2017 - 01/07/2018 Radiation Therapy    The patient initially received a dose of 50.4 Gy in 28 fractions to the breast using whole-breast tangent fields. This was delivered using a 3-D conformal technique. The patient then received a boost to the seroma. This delivered an additional 10 Gy in 5 fractions using a 3-D technique. The total dose was 60.4 Gy.   02/15/2018 Genetic Testing   The Common Hereditary Cancer Panel offered by [facility] includes sequencing and/or deletion duplication testing of the following 47 genes: APC, ATM, AXIN2, BARD1,  BMPR1A, BRCA1, BRCA2, BRIP1, CDH1, CDKN2A (p14ARF), CDKN2A (p16INK4a), CKD4, CHEK2, CTNNA1, DICER1, EPCAM (Deletion/duplication testing only), GREM1 (promoter region deletion/duplication testing only), KIT, MEN1, MLH1, MSH2, MSH3, MSH6, MUTYH, NBN, NF1, NHTL1, PALB2, PDGFRA, PMS2, POLD1, POLE, PTEN, RAD50, RAD51C, RAD51D, SDHB, SDHC, SDHD, SMAD4, SMARCA4. STK11, TP53, TSC1, TSC2, and VHL.  The following genes were evaluated for sequence changes only: SDHA and HOXB13 c.251G>A variant only.   Results: POSITIVE. 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 The date of this test report is 02/15/2018   10/20/2019 PET scan   Bone mets involving Right hip, Bil Iliac bones, T8, T9, L2 and L3, Rt prox humerus, Righ glenoid. Some lytic and some sclerotic. Mild Left supraclav adenopathy, 2 small lesions basal ganglia (could be lacunar infarcts)   11/14/2019 - 12/26/2019 Chemotherapy   The patient had palonosetron (ALOXI) injection 0.25 mg, 0.25 mg, Intravenous,  Once, 2 of 5 cycles Administration: 0.25 mg (12/05/2019), 0.25 mg (12/26/2019) ado-trastuzumab emtansine (KADCYLA) 260 mg in sodium chloride 0.9 % 250 mL chemo infusion, 3.6 mg/kg = 260 mg, Intravenous, Once, 3 of 6 cycles Administration: 260 mg (11/14/2019), 260 mg (12/05/2019), 260 mg (12/26/2019)   for chemotherapy treatment.     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  Required TPN briefly.  Patient was able to swallow and keep food down by the time of discharge.   02/06/2020 Miscellaneous   Talazoparib   07/15/2021 -  Chemotherapy   Patient is on Treatment Plan : Intrathecal Methotrexate  Fallopian tube cancer, carcinoma, right ([place])  11/04/2017 Surgery   Uterus, cervix and bilateral fallopian tubes: Fallopian tube with serous carcinoma 0.6 cm positive for p53, PAX8, WT-1, MOC-31,  cytokeratin 7, and estrogen receptor   01/11/2018 -  Chemotherapy   Taxol and carboplatin every 3 weeks (Taxol discontinued with cycle 2 because of profound rash)   10/20/2019 PET scan   Bone mets involving Right hip, Bil Iliac bones, T8, T9, L2 and L3, Rt prox humerus, Righ glenoid. Some lytic and some sclerotic. Mild Left supraclav adenopathy, 2 small lesions basal ganglia (could be lacunar infarcts)   11/14/2019 - 12/26/2019 Chemotherapy   The patient had palonosetron (ALOXI) injection 0.25 mg, 0.25 mg, Intravenous,  Once, 2 of 5 cycles Administration: 0.25 mg (12/05/2019), 0.25 mg (12/26/2019) ado-trastuzumab emtansine (KADCYLA) 260 mg in sodium chloride 0.9 % 250 mL chemo infusion, 3.6 mg/kg = 260 mg, Intravenous, Once, 3 of 6 cycles Administration: 260 mg (11/14/2019), 260 mg (12/05/2019), 260 mg (12/26/2019)   for chemotherapy treatment.     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 with above-mentioned history of metastatic breast cancer who is currently on talazoparib and Xgeva. She presents to the clinic today for follow-up.  She had a brain MRI for instability of the gait and was detected to have leptomeningeal enhancement in the cerebellum and she is here today to discuss these results and to discuss further treatment plan.  ALLERGIES:  has No Known Allergies.  MEDICATIONS:  Current Outpatient Medications  Medication Sig Dispense Refill   Accu-Chek FastClix Lancets MISC Use as instructed. Inject into the skin twice daily 100 each 3   atorvastatin (LIPITOR) 40 MG tablet Take 1 tablet (40 mg total) by mouth daily. 90 tablet 1   Blood Glucose  Calibration (ACCU-CHEK GUIDE CONTROL) LIQD 1 each by In Vitro route once as needed for up to 1 dose. 1 each 0   cholecalciferol (VITAMIN D) 1000 units tablet Take 1,000 Units by mouth daily.     glucose blood (ACCU-CHEK GUIDE) test strip Use as instructed. Check blood glucose by fingerstick twice per day. 100 each 12   Lancet Devices (ACCU-CHEK SOFTCLIX) lancets Use as instructed 1 each 0   lidocaine-prilocaine (EMLA) cream Apply 1 application topically as needed. 30 g 5   metFORMIN (GLUCOPHAGE) 500 MG tablet TAKE 2 TABLETS BY MOUTH 2 (TWO) TIMES DAILY WITH A MEAL. 360 tablet 1   ondansetron (ZOFRAN ODT) 4 MG disintegrating tablet Take 1 tablet (4 mg total) by mouth every 8 (eight) hours as needed for nausea or vomiting. 30 tablet 0   talazoparib tosylate (TALZENNA) 0.25 MG capsule Take 2 capsules (0.5 mg total) by mouth daily. 60 capsule 1   No current facility-administered medications for this visit.   Facility-Administered Medications Ordered in Other Visits  Medication Dose Route Frequency Provider Last Rate Last Admin   heparin lock flush 100 unit/mL  500 Units Intracatheter Once PRN [name], MD       sodium chloride flush (NS) 0.9 % injection 10 mL  10 mL Intracatheter PRN [name], MD        PHYSICAL EXAMINATION: ECOG  PERFORMANCE STATUS: 1 - Symptomatic but completely ambulatory  Vitals:   07/14/21 1452  BP: 138/77  Pulse: (!) 102  Resp: 18  Temp: (!) 97.5 F (36.4 C)  SpO2: 100%   Filed Weights   07/14/21 1452  Weight: 143 lb 1.6 oz (64.9 kg)      LABORATORY DATA:  I have reviewed the data as listed CMP Latest Ref Rng & Units 07/14/2021 06/02/2021 04/04/2021  Glucose 70 - 99 mg/dL 113(H) 114(H) 95  BUN 6 - 20 mg/dL 10 9 11   Creatinine 0.44 - 1.00 mg/dL 0.70 0.67 0.57  Sodium 135 - 145 mmol/L 140 142 141  Potassium 3.5 - 5.1 mmol/L 3.8 3.8 3.8  Chloride 98 - 111 mmol/L 106 107 102  CO2 22 - 32 mmol/L 27 25 25   Calcium 8.9 - 10.3 mg/dL 9.4 9.0 9.3  Total  Protein 6.5 - 8.1 g/dL 7.7 7.3 7.2  Total Bilirubin 0.3 - 1.2 mg/dL 0.4 0.4 0.3  Alkaline Phos 38 - 126 U/L 83 72 89  AST 15 - 41 U/L 17 17 12   ALT 0 - 44 U/L 21 22 15     Lab Results  Component Value Date   WBC 6.2 07/14/2021   HGB 10.8 (L) 07/14/2021   HCT 32.8 (L) 07/14/2021   MCV 105.1 (H) 07/14/2021   PLT 135 (L) 07/14/2021   NEUTROABS 4.5 07/14/2021    ASSESSMENT & PLAN:  Fallopian tube cancer, carcinoma, right ([facility]) Left lumpectomy: IDC grade 3, 1.2 cm, DCIS is present, margins negative, 0/6 lymph nodes negative, ER 0%, PR 0%, HER-2 positive ratio 2.28, Ki-67 20%, T1 BN 0 stage IA S/P Taxol-Herceptin BRCA 2 Mutation Positive   10/20/19: Bone mets involving Right hip, Bil Iliac bones, T8, T9, L2 and L3, Rt prox humerus, Righ glenoid. Some lytic and some sclerotic. Mild Left supraclav adenopathy, 2 small lesions basal ganglia (could be lacunar infarcts)   Biopsy the supra clav LN: 10/31/2019: Metastatic breast cancer, ER/PR negative HER-2 equivocal by IHC, FISH positive Treatment summary: Kadcyla every 3-week treatment.  Discontinued for progression   MRI of the back 01/07/2020: Extensive metastatic disease to the involved spine.  Tumor replacement of L3 vertebral body with pathologic compression fracture, epidural and extraosseous extension.  Tumor extending throughout L3 foramen Status post palliative radiation therapy Causing severe dysphagia and dehydration and hospitalization 01/29/2020-02/03/2020 ------------------------------------------------------------------------------------------------------------------------------------------------------ Echocardiogram 11/14/2019 Current treatment: Talazoparib started 02/07/2020   Toxicities: Occasional mild nausea   Monitoring closely for cytopenias. Anemia:   Thrombocytopenia:    Current dosage of talazoparib to 0.5 mg started 09/20/2020 CT CAP 09/20/19: No evidence of ST mets, tiny inferior right hepatic lesion substantially decreased  (3 mm), bone mets have become sclerotic suggest healing CT CAP 12/26/2020: Stable diffuse bone mets CT CAP 07/01/2021: Stable bone metastases.  New liver lesion 1.5 cm   Recommendation: Microwave ablation of the liver met and continuation of Talazoparib Brain MRI: Cerebellar leptomeningeal enhancement: I recommended that we obtain lumbar puncture to assess for leptomeningeal carcinomatosis. Since we are going to go through the process of a lumbar puncture, I will inject intrathecal methotrexate as well.  This procedure will be done tomorrow morning.  Our plan is to switch her from talazoparib to Tucatinib along with Xeloda with Herceptin.  We will await the results of CSF analysis as well as the completion of microwave ablation before switching her treatment.    No orders of the defined types were placed in this encounter.  The patient has a good understanding  of the overall plan. she agrees with it. she will call with any problems that may develop before the next visit here.  Total time spent: 30 mins including face to face time and time spent for planning, charting and coordination of care  Rulon Eisenmenger, MD, MPH 07/14/2021  I, Thana Ates, am acting as scribe for [name].  I have reviewed the above documentation for accuracy and completeness, and I agree with the above.

## 2021-07-14 ENCOUNTER — Other Ambulatory Visit: Payer: Self-pay | Admitting: Hematology and Oncology

## 2021-07-14 ENCOUNTER — Inpatient Hospital Stay: Payer: Medicaid Other

## 2021-07-14 ENCOUNTER — Other Ambulatory Visit: Payer: Self-pay | Admitting: *Deleted

## 2021-07-14 ENCOUNTER — Inpatient Hospital Stay (HOSPITAL_BASED_OUTPATIENT_CLINIC_OR_DEPARTMENT_OTHER): Payer: Medicaid Other | Admitting: Hematology and Oncology

## 2021-07-14 ENCOUNTER — Other Ambulatory Visit: Payer: Self-pay

## 2021-07-14 ENCOUNTER — Other Ambulatory Visit: Payer: Self-pay | Admitting: Pharmacist

## 2021-07-14 DIAGNOSIS — C50512 Malignant neoplasm of lower-outer quadrant of left female breast: Secondary | ICD-10-CM

## 2021-07-14 DIAGNOSIS — D649 Anemia, unspecified: Secondary | ICD-10-CM | POA: Diagnosis not present

## 2021-07-14 DIAGNOSIS — Z923 Personal history of irradiation: Secondary | ICD-10-CM | POA: Diagnosis not present

## 2021-07-14 DIAGNOSIS — C7951 Secondary malignant neoplasm of bone: Secondary | ICD-10-CM | POA: Diagnosis not present

## 2021-07-14 DIAGNOSIS — D696 Thrombocytopenia, unspecified: Secondary | ICD-10-CM | POA: Diagnosis not present

## 2021-07-14 DIAGNOSIS — C7949 Secondary malignant neoplasm of other parts of nervous system: Secondary | ICD-10-CM | POA: Insufficient documentation

## 2021-07-14 DIAGNOSIS — C5701 Malignant neoplasm of right fallopian tube: Secondary | ICD-10-CM

## 2021-07-14 DIAGNOSIS — Z7984 Long term (current) use of oral hypoglycemic drugs: Secondary | ICD-10-CM | POA: Diagnosis not present

## 2021-07-14 DIAGNOSIS — Z9221 Personal history of antineoplastic chemotherapy: Secondary | ICD-10-CM | POA: Diagnosis not present

## 2021-07-14 DIAGNOSIS — Z95828 Presence of other vascular implants and grafts: Secondary | ICD-10-CM

## 2021-07-14 DIAGNOSIS — Z79899 Other long term (current) drug therapy: Secondary | ICD-10-CM | POA: Diagnosis not present

## 2021-07-14 DIAGNOSIS — Z171 Estrogen receptor negative status [ER-]: Secondary | ICD-10-CM

## 2021-07-14 DIAGNOSIS — Z7189 Other specified counseling: Secondary | ICD-10-CM

## 2021-07-14 DIAGNOSIS — C787 Secondary malignant neoplasm of liver and intrahepatic bile duct: Secondary | ICD-10-CM | POA: Diagnosis not present

## 2021-07-14 LAB — CBC WITH DIFFERENTIAL (CANCER CENTER ONLY)
Abs Immature Granulocytes: 0.04 10*3/uL (ref 0.00–0.07)
Basophils Absolute: 0 10*3/uL (ref 0.0–0.1)
Basophils Relative: 0 %
Eosinophils Absolute: 0 10*3/uL (ref 0.0–0.5)
Eosinophils Relative: 1 %
HCT: 32.8 % — ABNORMAL LOW (ref 36.0–46.0)
Hemoglobin: 10.8 g/dL — ABNORMAL LOW (ref 12.0–15.0)
Immature Granulocytes: 1 %
Lymphocytes Relative: 17 %
Lymphs Abs: 1 10*3/uL (ref 0.7–4.0)
MCH: 34.6 pg — ABNORMAL HIGH (ref 26.0–34.0)
MCHC: 32.9 g/dL (ref 30.0–36.0)
MCV: 105.1 fL — ABNORMAL HIGH (ref 80.0–100.0)
Monocytes Absolute: 0.6 10*3/uL (ref 0.1–1.0)
Monocytes Relative: 9 %
Neutro Abs: 4.5 10*3/uL (ref 1.7–7.7)
Neutrophils Relative %: 72 %
Platelet Count: 135 10*3/uL — ABNORMAL LOW (ref 150–400)
RBC: 3.12 MIL/uL — ABNORMAL LOW (ref 3.87–5.11)
RDW: 14.8 % (ref 11.5–15.5)
WBC Count: 6.2 10*3/uL (ref 4.0–10.5)
nRBC: 0 % (ref 0.0–0.2)

## 2021-07-14 LAB — CMP (CANCER CENTER ONLY)
ALT: 21 U/L (ref 0–44)
AST: 17 U/L (ref 15–41)
Albumin: 3.8 g/dL (ref 3.5–5.0)
Alkaline Phosphatase: 83 U/L (ref 38–126)
Anion gap: 7 (ref 5–15)
BUN: 10 mg/dL (ref 6–20)
CO2: 27 mmol/L (ref 22–32)
Calcium: 9.4 mg/dL (ref 8.9–10.3)
Chloride: 106 mmol/L (ref 98–111)
Creatinine: 0.7 mg/dL (ref 0.44–1.00)
GFR, Estimated: >60 mL/min (ref >60)
Glucose, Bld: 113 mg/dL — ABNORMAL HIGH (ref 70–99)
Potassium: 3.8 mmol/L (ref 3.5–5.1)
Sodium: 140 mmol/L (ref 135–145)
Total Bilirubin: 0.4 mg/dL (ref 0.3–1.2)
Total Protein: 7.7 g/dL (ref 6.5–8.1)

## 2021-07-14 MED ORDER — SODIUM CHLORIDE 0.9% FLUSH
10.0000 mL | INTRAVENOUS | Status: DC | PRN
  Administered 2021-07-14: 10 mL

## 2021-07-14 MED ORDER — HEPARIN SOD (PORK) LOCK FLUSH 100 UNIT/ML IV SOLN
500.0000 [IU] | Freq: Once | INTRAVENOUS | Status: AC | PRN
  Administered 2021-07-14: 500 [IU]

## 2021-07-14 NOTE — Telephone Encounter (Signed)
Called the pt 2 times and the call was dropped both time.

## 2021-07-14 NOTE — Progress Notes (Signed)
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 I recommend that we perform a lumbar puncture and administer intrathecal methotrexate. I would also like to refer her to neurooncology for further evaluation as well as to be presented with a brain tumor board.

## 2021-07-14 NOTE — Assessment & Plan Note (Signed)
Left lumpectomy: IDC grade 3, 1.2 cm, DCIS is present, margins negative, 0/6 lymph nodes negative, ER 0%, PR 0%, HER-2 positive ratio 2.28, Ki-67 20%, T1 BN 0 stage IA S/P Taxol-Herceptin BRCA 2 Mutation Positive  10/20/19:Bone mets involving Right hip, Bil Iliac bones, T8, T9, L2 and L3, Rt prox humerus, Righ glenoid. Some lytic and some sclerotic. Mild Left supraclav adenopathy, 2 small lesions basal ganglia (could be lacunar infarcts)  Biopsy the supra clav LN: 10/31/2019: Metastatic breast cancer, ER/PR negative HER-2 equivocal by IHC, FISHpositive Treatmentsummary: Kadcyla every 3-week treatment.Discontinued for progression  MRI of the back 01/07/2020: Extensive metastatic disease to the involved spine. Tumor replacement of L3 vertebral body with pathologic compression fracture, epidural and extraosseous extension. Tumor extending throughout L3 foramen Status post palliative radiation therapyCausing severe dysphagia and dehydration and hospitalization5/17/2021-02/03/2020 ------------------------------------------------------------------------------------------------------------------------------------------------------ Echocardiogram 11/14/2019 Current treatment:Talazoparib started 02/07/2020  Toxicities: Occasional mild nausea   Monitoring closely for cytopenias. Anemia:   Thrombocytopenia:   Currentdosage of talazoparib to0.5 mgstarted 09/20/2020 CT CAP 09/20/19: No evidence of ST mets, tiny inferior right hepatic lesion substantially decreased(3 mm), bone mets have become sclerotic suggest healing CT CAP 12/26/2020:Stable diffuse bone mets CT CAP 07/01/2021: Stable bone metastases.  New liver lesion 1.5 cm   Recommendation: Microwave ablation of the liver met and continuation of Talazoparib

## 2021-07-15 ENCOUNTER — Inpatient Hospital Stay: Payer: Medicare Other | Attending: Gynecologic Oncology | Admitting: Hematology and Oncology

## 2021-07-15 ENCOUNTER — Other Ambulatory Visit: Payer: Self-pay | Admitting: Radiation Therapy

## 2021-07-15 ENCOUNTER — Other Ambulatory Visit: Payer: Self-pay

## 2021-07-15 ENCOUNTER — Inpatient Hospital Stay: Payer: Medicare Other

## 2021-07-15 VITALS — BP 117/66 | HR 78 | Temp 98.6°F | Resp 17

## 2021-07-15 DIAGNOSIS — D696 Thrombocytopenia, unspecified: Secondary | ICD-10-CM | POA: Insufficient documentation

## 2021-07-15 DIAGNOSIS — C50512 Malignant neoplasm of lower-outer quadrant of left female breast: Secondary | ICD-10-CM

## 2021-07-15 DIAGNOSIS — Z7984 Long term (current) use of oral hypoglycemic drugs: Secondary | ICD-10-CM | POA: Insufficient documentation

## 2021-07-15 DIAGNOSIS — C7949 Secondary malignant neoplasm of other parts of nervous system: Secondary | ICD-10-CM | POA: Diagnosis not present

## 2021-07-15 DIAGNOSIS — Z79899 Other long term (current) drug therapy: Secondary | ICD-10-CM | POA: Diagnosis not present

## 2021-07-15 DIAGNOSIS — Z5111 Encounter for antineoplastic chemotherapy: Secondary | ICD-10-CM | POA: Diagnosis not present

## 2021-07-15 DIAGNOSIS — Z923 Personal history of irradiation: Secondary | ICD-10-CM | POA: Diagnosis not present

## 2021-07-15 DIAGNOSIS — Z9221 Personal history of antineoplastic chemotherapy: Secondary | ICD-10-CM | POA: Insufficient documentation

## 2021-07-15 DIAGNOSIS — Z171 Estrogen receptor negative status [ER-]: Secondary | ICD-10-CM

## 2021-07-15 DIAGNOSIS — Z7189 Other specified counseling: Secondary | ICD-10-CM

## 2021-07-15 DIAGNOSIS — D649 Anemia, unspecified: Secondary | ICD-10-CM | POA: Insufficient documentation

## 2021-07-15 DIAGNOSIS — C5701 Malignant neoplasm of right fallopian tube: Secondary | ICD-10-CM

## 2021-07-15 DIAGNOSIS — C7951 Secondary malignant neoplasm of bone: Secondary | ICD-10-CM | POA: Diagnosis not present

## 2021-07-15 LAB — CSF CELL COUNT WITH DIFFERENTIAL
RBC Count, CSF: 1 /mm3 — ABNORMAL HIGH
Tube #: 4
WBC, CSF: 1 /mm3 (ref 0–5)

## 2021-07-15 LAB — GLUCOSE, CSF: Glucose, CSF: 86 mg/dL — ABNORMAL HIGH (ref 40–70)

## 2021-07-15 LAB — PROTEIN, CSF: Total  Protein, CSF: 73 mg/dL — ABNORMAL HIGH (ref 15–45)

## 2021-07-15 MED ORDER — ONDANSETRON HCL 8 MG PO TABS
8.0000 mg | ORAL_TABLET | Freq: Once | ORAL | Status: AC
  Administered 2021-07-15: 8 mg via ORAL
  Filled 2021-07-15: qty 1

## 2021-07-15 MED ORDER — SODIUM CHLORIDE 0.9 % IJ SOLN
INTRAMUSCULAR | Status: DC
  Filled 2021-07-15: qty 0.48

## 2021-07-15 NOTE — Progress Notes (Signed)
Prior to DC VSS, new bandage was applied since other band aid was coming off. Site C,D,I. Patient stated that she felt fine. Written instructions post LP were provided to patient and verbally reviewed. Patient verbalized understanding and no other questions or concerns.

## 2021-07-15 NOTE — Patient Instructions (Signed)
Lumbar Puncture, Care After This sheet gives you information about how to care for yourself after your procedure. Your health care provider may also give you more specific instructions. If you have problems or questions, contact your health care provider. What can I expect after the procedure? After the procedure, it is common to have: Mild discomfort or pain at the puncture site. A mild headache that is relieved with pain medicines. Follow these instructions at home: Activity  Lie down flat or rest for as long as directed by your health care provider. Return to your normal activities as told by your health care provider. Ask your health care provider what activities are safe for you. Avoid lifting anything heavier than 10 lb (4.5 kg) for at least 12 hours after the procedure. Do not drive for 24 hours if you were given a medicine to help you relax (sedative) during your procedure. Do not drive or use heavy machinery while taking prescription pain medicine. Puncture site care Remove or change your bandage (dressing) as told by your health care provider. Check your puncture area every day for signs of infection. Check for: More pain. Redness or swelling. Fluid or blood leaking from the puncture site. Warmth. Pus or a bad smell. General instructions Take over-the-counter and prescription medicines only as told by your health care provider. Drink enough fluids to keep your urine clear or pale yellow. Your health care provider may recommend drinking caffeine to prevent a headache. Keep all follow-up visits as told by your health care provider. This is important. Contact a health care provider if: You have fever or chills. You have nausea or vomiting. You have a headache that lasts for more than 2 days or does not get better with medicine. Get help right away if: You develop any of the following in your legs: Weakness. Numbness. Tingling. You are unable to control when you urinate or  have a bowel movement (incontinence). You have signs of infection around your puncture site, such as: More pain. Redness or swelling. Fluid or blood leakage. Warmth. Pus or a bad smell. You are dizzy or you feel like you might faint. You have a severe headache, especially when you sit or stand. Summary A lumbar puncture is a procedure in which a small needle is inserted into the lower back to remove fluid that surrounds the brain and spinal cord. After this procedure, it is common to have a headache and pain around the needle insertion area. Lying flat, staying hydrated, and drinking caffeine can help prevent headaches. Monitor your needle insertion site for signs of infection, including warmth, fluid, or more pain. Get help right away if you develop leg weakness, leg numbness, incontinence, or severe headaches. This information is not intended to replace advice given to you by your health care provider. Make sure you discuss any questions you have with your health care provider. Document Revised: 12/12/2020 Document Reviewed: 07/11/2020 Elsevier Patient Education  2022 [employer].

## 2021-07-15 NOTE — Progress Notes (Signed)
Orders entered per MD for lab

## 2021-07-15 NOTE — Progress Notes (Signed)
   Lumbar puncture procedure note  Date of Procedure: 07/15/2021 Indication: Leptomeningeal disease evaluation, metastatic breast cancer Type of Anesthesia: Local 1% lidocaine  Consent has been obtained from the patient explaining the risks and benefits of the procedure including pain, bleeding and infection as well as methotrexate related side effects. Patient was placed in a sitting position leaning forward arching the back. Patient's name and date of birth were verified. The skin was sterilized with Betadine. 1% lidocaine was used to anesthetize the skin and subcutaneous tissues. The spinal needle was used to enter the L4-L5 intervertebral space and advanced to get clear CSF. Fluid was collected in specimen containers and labeled in the room to be sent for testing. 8 cc of CSF was obtained.The CSF was sent for cell count, cytology, glucose and protein and cultures. Patient will return next week to go over the results of the tests or sooner if the cell count suggests infection.  Intrathecal chemotherapy with methotrexate 12 mg of methotrexate was injected into the spinal fluid over 5 minutes followed by 5 cc of preservative-free saline  The needle was removed and pressure was placed the site of needle entry. Small Band-Aid was then placed over the site and patient was made to lay on the back or 15 minutes prior to discharge.   Patient was given instructions on avoiding exertional activities. Patient was instructed to call us if there was any signs and symptoms of bleeding or intractable headaches.    Complications: None Blood loss : None Disposition: Tolerated the procedure well and discharged home.  Signed Harriette Ohara, MD

## 2021-07-16 ENCOUNTER — Other Ambulatory Visit (HOSPITAL_COMMUNITY): Payer: Self-pay

## 2021-07-17 LAB — CYTOLOGY - NON PAP

## 2021-07-21 ENCOUNTER — Ambulatory Visit (HOSPITAL_COMMUNITY)
Admission: RE | Admit: 2021-07-21 | Discharge: 2021-07-21 | Disposition: A | Discharge status: Home / Self Care | Payer: Medicaid Other | Source: Ambulatory Visit | Attending: Interventional Radiology | Admitting: Interventional Radiology

## 2021-07-21 ENCOUNTER — Inpatient Hospital Stay: Payer: Medicare Other

## 2021-07-21 ENCOUNTER — Other Ambulatory Visit: Payer: Self-pay | Admitting: Radiation Therapy

## 2021-07-21 DIAGNOSIS — Z853 Personal history of malignant neoplasm of breast: Secondary | ICD-10-CM | POA: Diagnosis not present

## 2021-07-21 DIAGNOSIS — C787 Secondary malignant neoplasm of liver and intrahepatic bile duct: Secondary | ICD-10-CM | POA: Diagnosis not present

## 2021-07-21 DIAGNOSIS — I700 Atherosclerosis of aorta: Secondary | ICD-10-CM | POA: Diagnosis not present

## 2021-07-21 DIAGNOSIS — K7689 Other specified diseases of liver: Secondary | ICD-10-CM | POA: Diagnosis not present

## 2021-07-21 DIAGNOSIS — G96198 Other disorders of meninges, not elsewhere classified: Secondary | ICD-10-CM

## 2021-07-21 DIAGNOSIS — K449 Diaphragmatic hernia without obstruction or gangrene: Secondary | ICD-10-CM | POA: Diagnosis not present

## 2021-07-21 MED ORDER — GADOBUTROL 1 MMOL/ML IV SOLN
6.0000 mL | Freq: Once | INTRAVENOUS | Status: AC | PRN
  Administered 2021-07-21: 6 mL via INTRAVENOUS

## 2021-07-24 ENCOUNTER — Encounter: Payer: Self-pay | Admitting: Hematology and Oncology

## 2021-07-24 ENCOUNTER — Ambulatory Visit
Admission: RE | Admit: 2021-07-24 | Discharge: 2021-07-24 | Disposition: A | Discharge status: Home / Self Care | Payer: Medicaid Other | Source: Ambulatory Visit | Attending: Interventional Radiology | Admitting: Interventional Radiology

## 2021-07-24 DIAGNOSIS — C787 Secondary malignant neoplasm of liver and intrahepatic bile duct: Secondary | ICD-10-CM | POA: Diagnosis not present

## 2021-07-24 DIAGNOSIS — C50512 Malignant neoplasm of lower-outer quadrant of left female breast: Secondary | ICD-10-CM | POA: Diagnosis not present

## 2021-07-24 HISTORY — PX: IR RADIOLOGIST EVAL & MGMT: IMG5224

## 2021-07-24 NOTE — Progress Notes (Signed)
Chief Complaint: Patient was seen in follow up for metastatic breast carcinoma  History of Present Illness: Erin [name] is a 60 y.o. female with previous consultation on 07/09/21 for possible ablation of a 1.5 cm right lobe liver metastasis. I recommended an MRI of the abdomen at that time to confirm whether this was the only definable metastatic lesion in the liver before possibly proceeding with thermal ablation.  Since that time, she had an MRI of the brain on 07/10/21 that demonstrated abnormal signal within the cerebellar hemispheres and suspicion for leptomeningeal spread of carcinoma.  She underwent lumbar puncture on 07/15/2021 with cytology demonstrating atypical cells highly suspicious for metastatic carcinoma.  She did receive intrathecal methotrexate at the time of lumbar puncture.  MRI of the abdomen was performed on 07/21/2021 and demonstrates a solitary 1.5 x 1.3 cm lesion in the central right lobe center between segments 6 and 7.  No other metastatic lesions are identified in the liver by MRI.  She does have some complaints of difficulty walking and stumbling without falls.  She has some problems with writing.  She is not requiring any assistance to walk.  She does have fatigue.  She had a recent follow-up with Dr. [name] who has recommended microwave ablation of the liver metastasis and switching of chemotherapy regiment after ablation.  Past Medical History:  Diagnosis Date   Arthritis    knees, elbows   Borderline glaucoma of right eye    BRCA2 gene mutation positive 02/17/2018   BRCA2 c.4552del (p.[id number]*25)  Result reported out on 02/15/2018.    Breast cancer ([name]) 05/2017   left   Chronic back pain    Deaf    per pt born hearing and at age 73 1/2 lost hearing , was told by mother unknown cause, can miminally hear in left and no hearing on right    Depression    Dyspnea    states some SOB with ADLs and anemia    Elevated cancer antigen 125 (CA 125)     Family history of breast cancer    Family history of breast cancer    Family history of colon cancer    Genetic testing 02/17/2018   The Common Hereditary Cancer Panel offered by [facility] includes sequencing and/or deletion duplication testing of the following 47 genes: APC, ATM, AXIN2, BARD1, BMPR1A, BRCA1, BRCA2, BRIP1, CDH1, CDKN2A (p14ARF), CDKN2A (p16INK4a), CKD4, CHEK2, CTNNA1, DICER1, EPCAM (Deletion/duplication testing only), GREM1 (promoter region deletion/duplication testing only), KIT, MEN1, MLH1, MSH2, MSH3, MSH6, MU   History of cancer chemotherapy    left breast cancer 09-08-2017 to 10-09-2017;  fallopion tube cancer  12-15-2017  to 06-28-2018   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 oncologist-  dr [name]   11-04-2017  s/p  LAVH w/ BSO,  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;  completed chemo 06-28-2018   History of external beam radiation therapy    left breast  11-25-2017  to 01-07-2018   Hyperlipidemia    Hypertension    followed by pcp   (10-05-2019  per pt never had stress test)   IDA (iron deficiency anemia)    Malignant neoplasm of lower-outer quadrant of left breast of female, estrogen receptor negative [facility]) oncologist-- dr [name]   dx 08/ 2018--- Stage IA, DCIS,  ER/ PR negative,  HER-2 positive;  06-09-2017 s/p left breast lumpectomy with node dissection;  completed chemo 10-09-2017  and radiation 01-07-2018/  hercepton completed  06-28-2018   Non-insulin dependent type 2 diabetes mellitus ([place])    followed by pcp   (10-05-2019 per pt check cbg every other day in AM,  fasting cbg-- 105)   Numbness of right thumb    Wears glasses     Past Surgical History:  Procedure Laterality Date   BREAST LUMPECTOMY WITH RADIOACTIVE SEED AND SENTINEL LYMPH NODE BIOPSY Left 06/09/2017   Procedure: BREAST LUMPECTOMY WITH RADIOACTIVE SEED AND SENTINEL LYMPH NODE BIOPSY;  Surgeon: Excell Seltzer, MD;  Location: [facility];  Service: General;  Laterality: Left;   COLONOSCOPY  11/02/2017   polyps   HERNIA REPAIR     HIATAL HERNIA REPAIR  09/2019   IR RADIOLOGIST EVAL & MGMT  07/09/2021   LAPAROSCOPIC ASSISTED VAGINAL HYSTERECTOMY N/A 11/04/2017   Procedure: LAPAROSCOPIC ASSISTED VAGINAL HYSTERECTOMY;  Surgeon: [name], MD;  Location: [facility];  Service: Gynecology;  Laterality: N/A;   LAPAROSCOPIC BILATERAL SALPINGO OOPHERECTOMY Bilateral 11/04/2017   Procedure: LAPAROSCOPIC BILATERAL SALPINGO OOPHORECTOMY;  Surgeon: [name], MD;  Location: [facility];  Service: Gynecology;  Laterality: Bilateral;   LAPAROSCOPY N/A 10/09/2019   Procedure: LAPAROSCOPY DIAGNOSTIC WITH PERITONEAL BIOPSIES;  Surgeon: [name], MD;  Location[facility];  Service: Gynecology;  Laterality: N/A;   PORTACATH PLACEMENT Right 06/09/2017   Procedure: INSERTION PORT-A-CATH WITH [place];  Surgeon: Excell Seltzer, MD;  Location: [facility];  Service: General;  Laterality: Right;   PORTACATH PLACEMENT Right 11/13/2019   Procedure: INSERTION PORT-A-CATH WITH ULTRASOUND GUIDANCE;  Surgeon: Rolm Bookbinder, MD;  Location: [facility];  Service: General;  Laterality: Right;   TUBAL LIGATION  02/02/2002   @[facility]    [facility]   UPPER GI ENDOSCOPY      Allergies: Patient has no known allergies.  Medications: Prior to Admission medications   Medication Sig Start Date End Date Taking? Authorizing Provider  Accu-Chek FastClix Lancets MISC Use as instructed. Inject into the skin twice daily 11/18/20   [name], NP  atorvastatin (LIPITOR) 40 MG tablet Take 1 tablet (40 mg total) by mouth daily. 04/04/21   Scot Jun, FNP  Blood Glucose Calibration (ACCU-CHEK GUIDE CONTROL) LIQD 1 each by In Vitro route once as needed for up to 1 dose. 12/26/18   [name], NP  cholecalciferol (VITAMIN D) 1000 units tablet Take 1,000 Units by mouth daily.    [provider]  glucose blood  (ACCU-CHEK GUIDE) test strip Use as instructed. Check blood glucose by fingerstick twice per day. 11/18/20   [name], NP  Lancet Devices [facility]) lancets Use as instructed 12/17/14   Lance Bosch, NP  lidocaine-prilocaine (EMLA) cream Apply 1 application topically as needed. 09/03/20   Tanner, Lyndon Code., PA-C  metFORMIN (GLUCOPHAGE) 500 MG tablet TAKE 2 TABLETS BY MOUTH 2 (TWO) TIMES DAILY WITH A MEAL. 10/28/20   [name], MD  ondansetron (ZOFRAN ODT) 4 MG disintegrating tablet Take 1 tablet (4 mg total) by mouth every 8 (eight) hours as needed for nausea or vomiting. 04/04/21   Scot Jun, FNP  talazoparib tosylate (TALZENNA) 0.25 MG capsule Take 2 capsules (0.5 mg total) by mouth daily. 07/10/21 07/10/22  [name], MD     Family History  Problem Relation Age of Onset   Hypertension Mother    Lupus Mother    Diabetes Father    Hypertension Sister    Diabetes Paternal Grandmother    Colon cancer Paternal Grandmother  21   CAD Brother    Diabetes Brother    Heart attack Brother    Breast cancer Maternal Aunt        dx >50   Heart attack Paternal Grandfather    Breast cancer Maternal Aunt        dx under 66   Breast cancer Maternal Aunt        dx  under 41    Social History   Socioeconomic History   Marital status: Legally Separated    Spouse name: Not on file   Number of children: 2   Years of education: Not on file   Highest education level: Not on file  Occupational History   Not on file  Tobacco Use   Smoking status: Never   Smokeless tobacco: Never  Vaping Use   Vaping Use: Never used  Substance and Sexual Activity   Alcohol use: No   Drug use: Never   Sexual activity: Not on file  Other Topics Concern   Not on file  Social History Narrative   1 boy and 1 girl   Resides in [place]   Seperated   Social Determinants of Health   Financial Resource Strain: Not on file  Food Insecurity: Not on file  Transportation Needs: Not  on file  Physical Activity: Not on file  Stress: Not on file  Social Connections: Not on file    ECOG Status: 1 - Symptomatic but completely ambulatory  Review of Systems: A 12 point ROS discussed and pertinent positives are indicated in the HPI above.  All other systems are negative.  Review of Systems  Constitutional:  Positive for fatigue. Negative for activity change and fever.  Respiratory: Negative.    Cardiovascular: Negative.   Gastrointestinal: Negative.   Genitourinary: Negative.   Musculoskeletal: Negative.   Neurological:        Gait difficulty and some difficulty with writing.   Vital Signs: BP 126/75 (BP Location: Right Arm)   Pulse (!) 109   LMP 07/09/2017 (LMP Unknown)   SpO2 98%   Physical Exam  Imaging: MR Brain W Wo Contrast  Result Date: 07/12/2021 CLINICAL DATA:  Breast cancer, staging; imbalance with walking with met breast cancer EXAM: MRI HEAD WITHOUT AND WITH CONTRAST TECHNIQUE: Multiplanar, multiecho pulse sequences of the brain and surrounding structures were obtained without and with intravenous contrast. CONTRAST:  3m GADAVIST GADOBUTROL 1 MMOL/ML IV SOLN COMPARISON:  February 2021 FINDINGS: Brain: There is no acute infarction or intracranial hemorrhage. There is no intracranial mass or mass effect. There is no hydrocephalus or extra-axial fluid collection. Ventricles and sulci are normal in size and configuration. 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 There is ill-defined, patchy T2 hyperintensity of the cerebellum. Corresponding ill-defined enhancement is present. This is favored to be leptomeningeal. Vascular: Major vessel flow voids at the skull base are preserved. Skull and upper cervical spine: Normal marrow signal is preserved. Sinuses/Orbits: Lobular right maxillary sinus mucosal thickening. Orbits are unremarkable. Other: Sella is unremarkable.  Mastoid air cells are clear. IMPRESSION: Abnormal cerebellar signal  suspicious for leptomeningeal spread. CSF sampling is recommended. 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 These results will be called to the ordering clinician or representative by the Radiologist Assistant, and communication documented in the PACS or [employer] Electronically Signed   By: PMacy MisM.D.   On: 07/12/2021 09:47   MR ABDOMEN WWO CONTRAST  Result Date: 07/21/2021 CLINICAL DATA:  60 year old female with history of breast cancer. New lesion in the liver noted on prior CT of the chest, abdomen and pelvis 07/01/2021, concerning for possible metastatic lesion. Follow-up study. EXAM: MRI ABDOMEN WITHOUT AND WITH CONTRAST TECHNIQUE: Multiplanar multisequence MR imaging of the abdomen was performed both before and after the administration of intravenous contrast. CONTRAST:  63m GADAVIST GADOBUTROL 1 MMOL/ML IV SOLN COMPARISON:  None. FINDINGS: Lower chest: Small hiatal hernia. Hepatobiliary: In the central aspect of the right lobe of the liver centered between segments 6 and 7 (axial image 37 of series 16) measuring 1.5 x 1.3 cm, which is T1 hypointense, mildly T2 hyperintense and hypovascular with low level progressive enhancement which nearly normalizes to normal hepatic parenchyma on delayed images, in addition to definitive diffusion restriction, indicative of a metastatic lesion. No other hepatic lesions are noted elsewhere. No intra or extrahepatic biliary ductal dilatation. Gallbladder is normal in appearance. Pancreas: No pancreatic mass. No pancreatic ductal dilatation. No pancreatic or peripancreatic fluid collections or inflammatory changes. Spleen:  Unremarkable. Adrenals/Urinary Tract: Bilateral kidneys and adrenal glands are normal in appearance. No hydroureteronephrosis noted in the visualized portions of the abdomen. Stomach/Bowel: Visualized portions are unremarkable. Vascular/Lymphatic: Aortic atherosclerosis, without evidence of aneurysm or dissection  in the abdominal vasculature. No lymphadenopathy noted in the abdomen. Other: No significant volume of ascites noted in the visualized portions of the peritoneal cavity. Musculoskeletal: Multiple signal abnormalities are noted throughout the visualized axial spine, compatible with known metastatic disease. IMPRESSION: 1. Previously noted lesion of concern in the right lobe of the liver has imaging characteristics compatible with a metastatic lesion. No other hepatic lesions are identified. 2. Widespread metastatic disease to the bones redemonstrated. 3. Small hiatal hernia. Electronically Signed   By: DVinnie LangtonM.D.   On: 07/21/2021 12:48   CT CHEST ABDOMEN PELVIS W CONTRAST  Result Date: 07/02/2021 CLINICAL DATA:  Metastatic breast cancer restaging EXAM: CT CHEST, ABDOMEN, AND PELVIS WITH CONTRAST TECHNIQUE: Multidetector CT imaging of the chest, abdomen and pelvis was performed following the standard protocol during bolus administration of intravenous contrast. CONTRAST:  858mOMNIPAQUE IOHEXOL 350 MG/ML SOLN, additional oral enteric contrast COMPARISON:  12/25/2020, 09/18/2020, 01/01/2020 FINDINGS: CT CHEST FINDINGS Cardiovascular: Right chest catheter aortic atherosclerosis. Normal heart size. No pericardial effusion. Mediastinum/Nodes: No enlarged mediastinal, hilar, or axillary lymph nodes. Small hiatal hernia. Thyroid gland, trachea, and esophagus demonstrate no significant findings. Lungs/Pleura: Paramedian fibrosis of the lower lobes (series 4, image 77). No pleural effusion or pneumothorax. Musculoskeletal: No chest wall mass. CT ABDOMEN PELVIS FINDINGS Hepatobiliary: There is a new hypodense lesion of the posterior right lobe of the liver, hepatic segment V, measuring 1.5 x 1.5 cm (series 2, image 48). No gallstones, gallbladder wall thickening, or biliary dilatation. Pancreas: Unremarkable. No pancreatic ductal dilatation or surrounding inflammatory changes. Spleen: Normal in size without  significant abnormality. Adrenals/Urinary Tract: Adrenal glands are unremarkable. Kidneys are normal, without renal calculi, solid lesion, or hydronephrosis. Bladder is unremarkable. Stomach/Bowel: Stomach is within normal limits. Appendix appears normal. No evidence of bowel wall thickening, distention, or inflammatory changes. Vascular/Lymphatic: No significant vascular findings are present. No enlarged abdominal or pelvic lymph nodes. Reproductive: Status post hysterectomy. Other: No abdominal wall hernia or abnormality. No abdominopelvic ascites. Musculoskeletal: Unchanged, widespread sclerotic osseous metastatic disease throughout. IMPRESSION: 1. There is a new hypodense lesion of the posterior right lobe of the liver, hepatic segment V, measuring 1.5 x 1.5 cm, consistent with new hepatic metastatic disease. Note that this lesion is not at  the site of previously visualized liver lesions. 2. Unchanged, widespread sclerotic osseous metastatic disease throughout. 3. No evidence of metastatic disease within the chest. Aortic Atherosclerosis (ICD10-I70.0). Electronically Signed   By: Delanna Ahmadi M.D.   On: 07/02/2021 11:00   IR Radiologist Eval & Mgmt  Result Date: 07/09/2021 Please refer to notes tab for details about interventional procedure. (Op Note)   Labs:  CBC: Recent Labs    04/04/21 0920 04/11/21 0938 06/02/21 0854 07/14/21 1433  WBC 5.2 3.1* 5.4 6.2  HGB 10.9* 9.6* 10.1* 10.8*  HCT 31.5* 28.7* 30.8* 32.8*  PLT 65* 75* 53* 135*    COAGS: No results for input(s): INR, APTT in the last 8760 hours.  BMP: Recent Labs    11/14/20 0835 12/25/20 1312 02/25/21 1001 04/04/21 0920 06/02/21 0854 07/14/21 1433  NA 138  --  142 141 142 140  K 3.9  --  4.1 3.8 3.8 3.8  CL 106  --  108 102 107 106  CO2 22  --  23 25 25 27   GLUCOSE 91  --  101* 95 114* 113*  BUN 9  --  9 11 9 10   CALCIUM 8.8*  --  9.5 9.3 9.0 9.4  CREATININE 0.67   < > 0.66 0.57 0.67 0.70  GFRNONAA >60  --  >60   --  >60 >60   < > = values in this interval not displayed.    LIVER FUNCTION TESTS: Recent Labs    02/25/21 1001 04/04/21 0920 06/02/21 0854 07/14/21 1433  BILITOT 0.3 0.3 0.4 0.4  AST 16 12 17 17   ALT 15 15 22 21   ALKPHOS 72 89 72 83  PROT 7.6 7.2 7.3 7.7  ALBUMIN 3.7 4.2 3.6 3.8     Assessment and Plan:  I met in person with Ms. Laurance Flatten, her mother and a sign language interpreter.  We reviewed recent MRI studies of the abdomen and brain. The MRI of the abdomen does currently demonstrate only a solitary metastatic lesion in the right lobe.  Although somewhat deep in location, the lesion should be amenable to percutaneous thermal ablation based on size and location.  Details of microwave ablation were discussed with the patient including the need for general anesthesia, possible overnight observation and risks.  After discussion, Ms. Ficek would like to proceed with scheduling an ablation procedure of the liver.  We will begin the scheduling process.  The procedure will be performed at [facility].  Electronically Signed: Azzie Roup 07/24/2021, 11:51 AM    I spent a total of 15 Minutes in face to face in clinical consultation, greater than 50% of which was counseling/coordinating care for ablation of a liver metastasis.

## 2021-07-28 ENCOUNTER — Telehealth: Payer: Self-pay | Admitting: *Deleted

## 2021-07-28 NOTE — Telephone Encounter (Addendum)
Received call from pt requesting advice from MD if okay to proceed with wisdom tooth extraction.  Per MD okay for pt to proceed, once scheduled pt to alert our office and MD will hold [place].  Pt verbalized understanding.

## 2021-07-29 IMAGING — CT CT CHEST W/ CM
2 of 5 series · 13 of 36 positions shown, 16 images · IV contrast (omnipaque)
Comparison: PET-CT 10/19/2018

CLINICAL DATA: Breast cancer restaging

EXAM:
CT CHEST, ABDOMEN, AND PELVIS WITH CONTRAST
TECHNIQUE: Multidetector CT imaging of the chest, abdomen and pelvis was
performed following the standard protocol during bolus
administration of intravenous contrast.
CONTRAST:  100mL OMNIPAQUE IOHEXOL 300 MG/ML  SOLN

[Series 2: cap with · axial · 0.73mm/px · z∈[-711,-226]mm · 10 of 119 slices shown, 13 images]
[im 11/119  mediastinal]
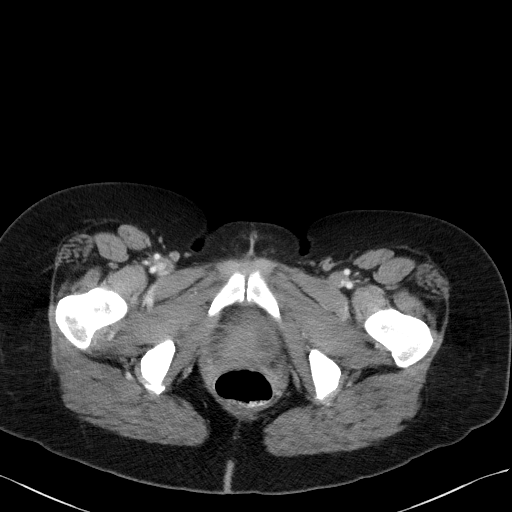
[im 11/119  lung]
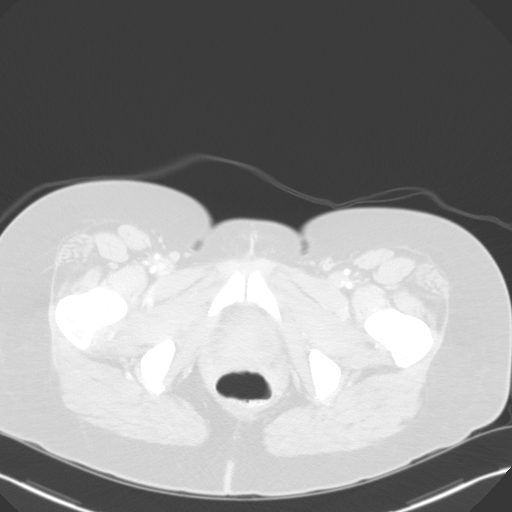
[im 22/119  lung]
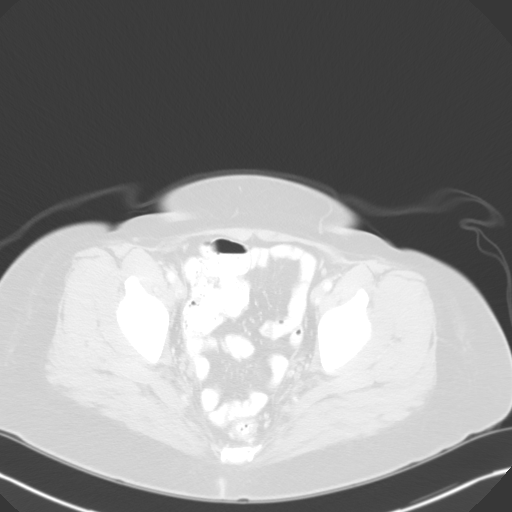
[im 33/119  lung]
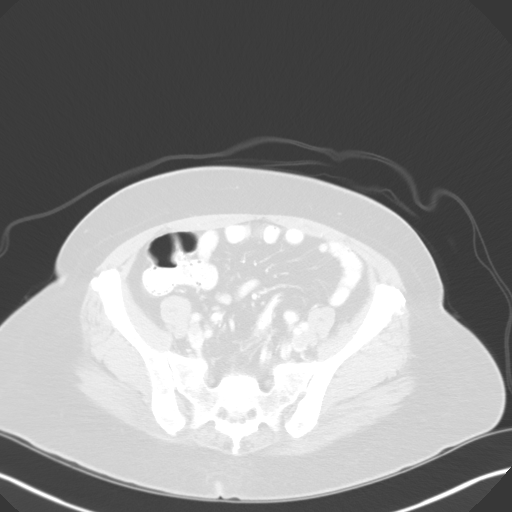
[im 43/119  lung]
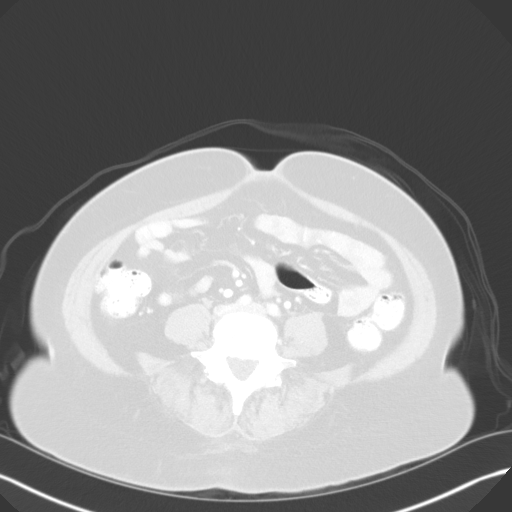
[im 54/119  mediastinal]
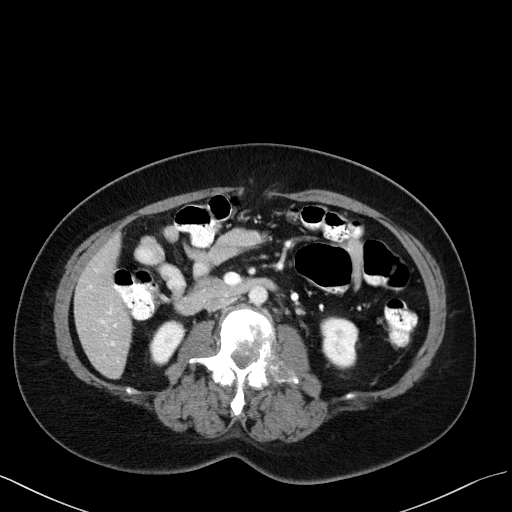
[im 54/119  lung]
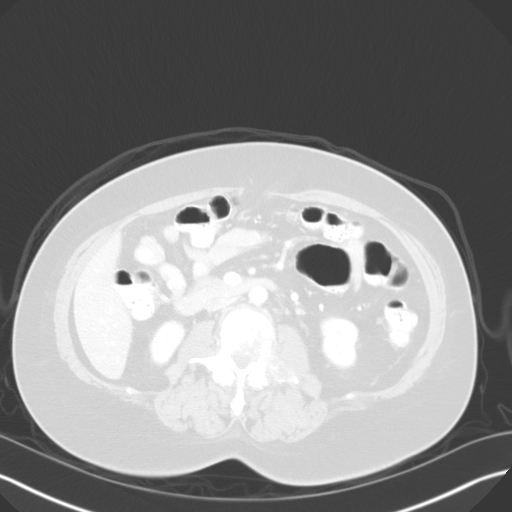
[im 65/119  lung]
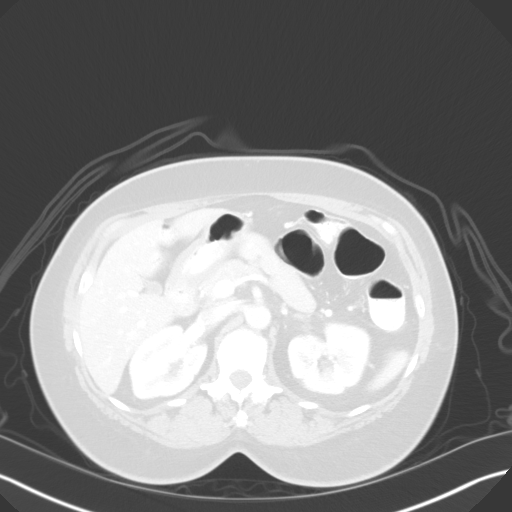
[im 76/119  lung]
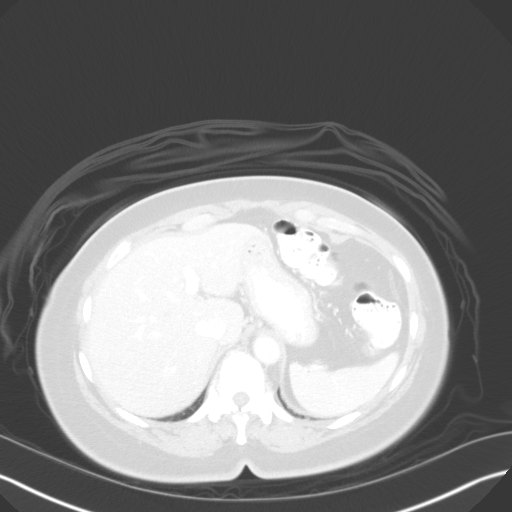
[im 86/119  lung]
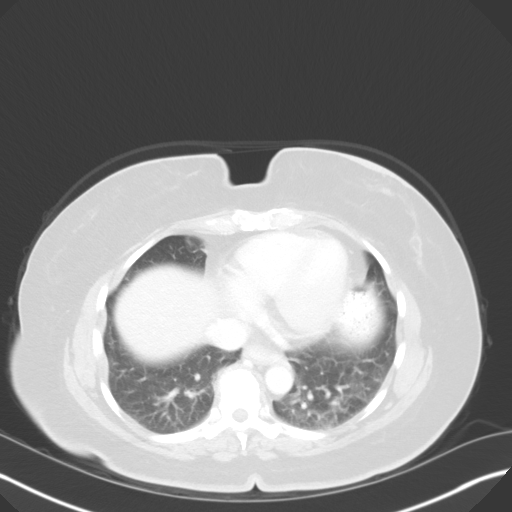
[im 97/119  mediastinal]
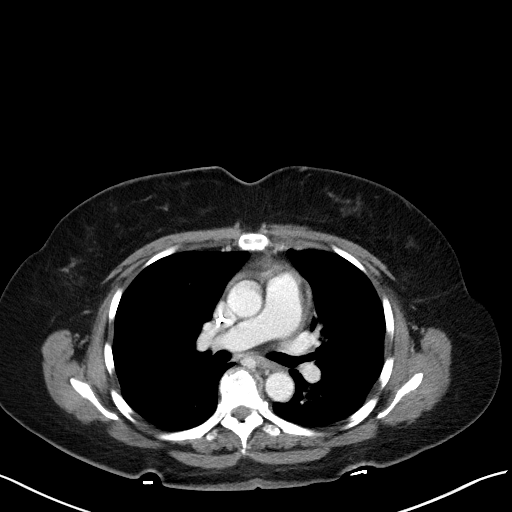
[im 97/119  lung]
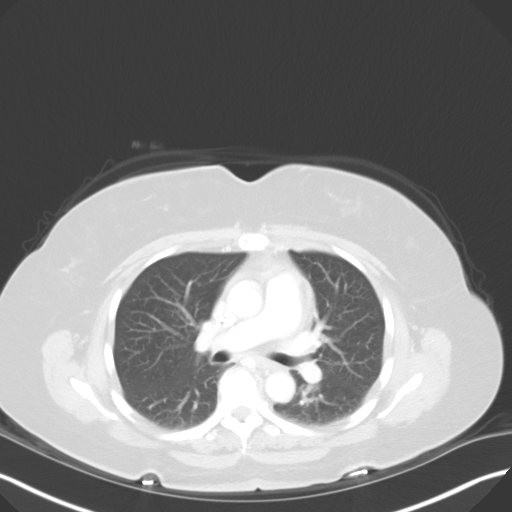
[im 108/119  lung]
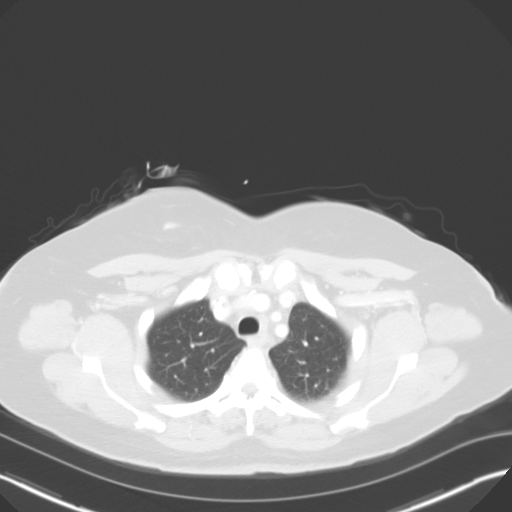

[Series 4: coronals · coronal · 0.82mm/px · 3 of 114 slices shown]
[im 23/114  lung]
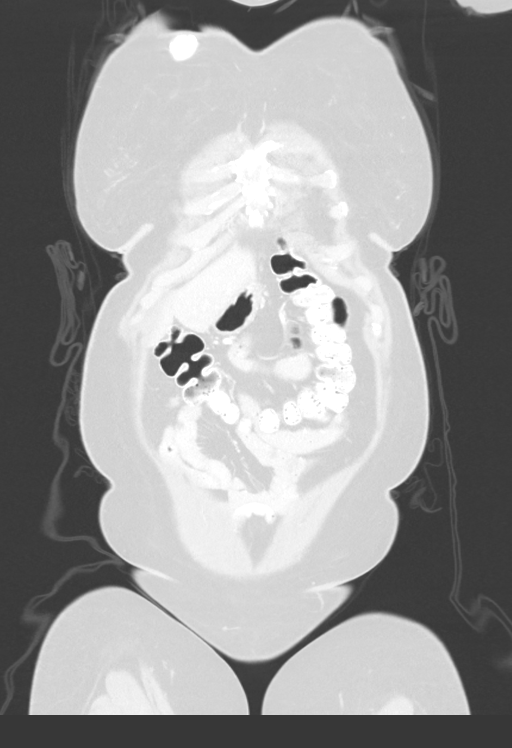
[im 46/114  lung]
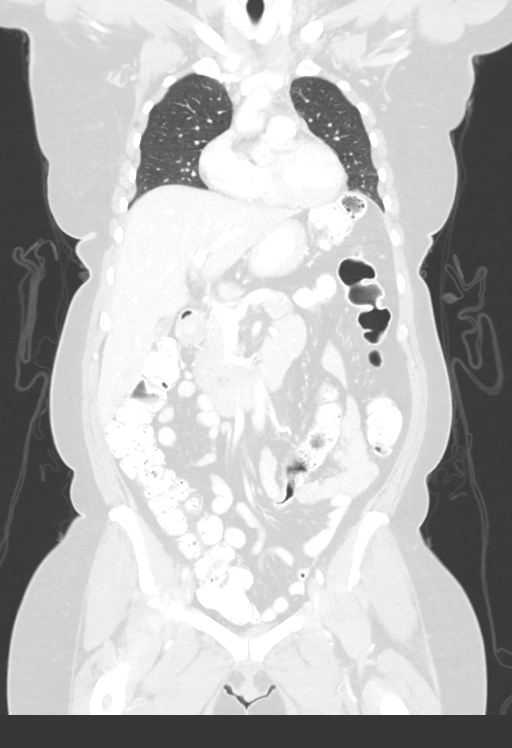
[im 68/114  lung]
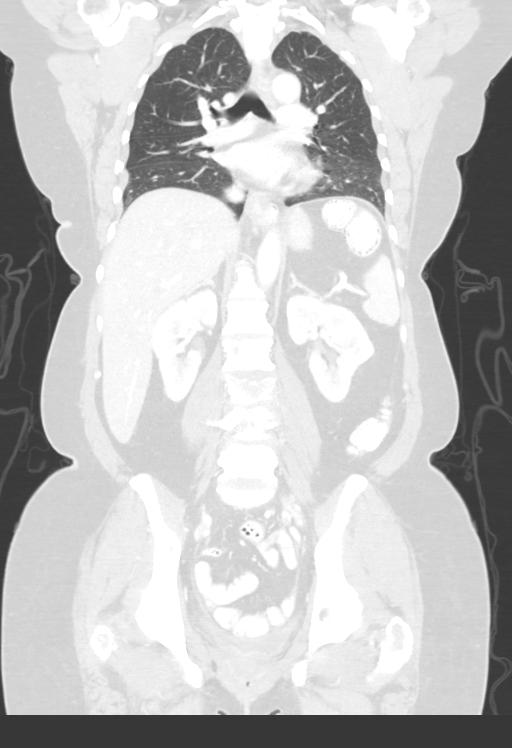

[13 of 36 positions shown; findings below may reference images not displayed]

FINDINGS: CT CHEST FINDINGS

Cardiovascular: Heart size appears within normal limits. No
pericardial effusion identified. Mild aortic atherosclerosis.

Mediastinum/Nodes: Normal appearance of the thyroid gland. The
trachea appears patent and is midline. Small hiatal hernia. Soft
tissue infiltration within the left supraclavicular region is noted
measuring 2.7 x 1.5 cm, image [DATE]. This appears similar to the
previous exam. No axillary adenopathy. No mediastinal or hilar
adenopathy.

Lungs/Pleura: No pleural effusion identified. No suspicious
pulmonary nodule or mass.

Musculoskeletal: Compared with 10/03/2019 there is been progression
of mixed lytic and sclerotic metastases involving the T8 and T9
vertebra. There are new lytic lesions involving the superior
endplate of T5 and T6, image 69/5. New lytic lesions are also
identified within the posterior aspect of T11, T12, and L2.

CT ABDOMEN PELVIS FINDINGS

Hepatobiliary: Multiple scattered low-attenuation foci within the
liver are new from previous study:

-8 mm low-density structure within segment 7 is identified, image
47/2.

-segment 5 low-density lesion measures 1.2 cm, image 63/2.

-segment 3 low-density lesion measures 1.1 cm, image 52/2.

Gallbladder normal. No wall thickening or inflammation. No bile duct
dilatation.

Pancreas: Unremarkable. No pancreatic ductal dilatation or
surrounding inflammatory changes.

Spleen: Normal in size without focal abnormality.

Adrenals/Urinary Tract: Normal appearance of the adrenal glands.

The kidneys are unremarkable.

No kidney mass or hydronephrosis. The urinary bladder is negative.

Stomach/Bowel: Small hiatal hernia. No bowel wall thickening,
inflammation or distension. The appendix is visualized and appears
normal.

Vascular/Lymphatic: No significant vascular findings are present. No
enlarged abdominal or pelvic lymph nodes.

Reproductive: Status post hysterectomy. No adnexal masses.

Other: No ascites. No peritoneal nodule or mass. Supraumbilical,
midline hernia contains fat only, image 67/2.

Musculoskeletal: Interval progression of lytic bone metastases.

Progressive lytic lesion involve L3 vertebra with new associated
pathologic fracture. This measures 2.8 cm, image 67/5. This is
compared with 2.0 cm on 10/20/2019. Tumor now extends into the left
pedicle and left transverse process. Increased epidural tumor is
noted with associated decreased AP diameter of the lumbar spine
canal is noted which measures 8 mm on today's study. This is
compared with 11 mm at the L2 level.

Low new lytic changes are noted involving the L4 vertebra. Extension
into the L4 pedicle is also noted.

Lytic changes involving the left iliac wing are new, image 95/2.

There is a new lytic lesion involving the greater trochanter of the
right proximal femur which measures 3.2 cm on today's study.
IMPRESSION: 1. Compared with PET-CT from 10/20/2019 there has been interval
progression of lytic bone metastases involving the spine and bony
pelvis as well as the proximal right femur.
2. New, multifocal low-density lesions in both lobes of liver
concerning for liver metastases.
3. Similar appearance of soft tissue infiltration within the left
supraclavicular region compared with 10/03/2019.
4. Decreased AP diameter at the L3 level secondary to epidural
extension of tumor. Correlate for any clinical signs or symptoms of
spinal stenosis.

Aortic Atherosclerosis ([id number]).

## 2021-07-29 NOTE — Progress Notes (Signed)
Patient Care Team: [name], NP as PCP - General (Nurse Practitioner) Excell Seltzer, MD (Inactive) as Consulting Physician (General Surgery) [name], MD as Consulting Physician (Hematology and Oncology) Kyung Rudd, MD as Consulting Physician (Radiation Oncology) [name], MD as Consulting Physician (Gynecologic Oncology) Delice Bison, Charlestine Massed, NP as Nurse Practitioner (Hematology and Oncology) Mauro Kaufmann, RN as Oncology Nurse Navigator Rockwell Germany, RN as Oncology Nurse Navigator  DIAGNOSIS:    ICD-10-CM   1. Malignant neoplasm of lower-outer quadrant of left breast of female, estrogen receptor negative ([place])  C50.512    Z17.1       SUMMARY OF ONCOLOGIC HISTORY: Oncology History  Malignant neoplasm of lower-outer quadrant of left breast of female, estrogen receptor negative ([place])  05/11/2017 Initial Diagnosis   Left breast biopsy 3:30 position 6 cm from nipple: IDC with DCIS, lymphovascular invasion present, grade 2-3, ER 0%, PR 0%, HER-2 positive ratio 2.28, Ki-67 20%, 1 cm lesion left breast, T1b N0 stage IA clinical stage   06/09/2017 Surgery   Left lumpectomy: IDC grade 3, 1.2 cm, DCIS is present, margins negative, 0/6 lymph nodes negative, ER 0%, PR 0%, HER-2 positive ratio 2.28, Ki-67 20%, T1 BN 0 stage IA   07/09/2017 - 09/29/2017 Adjuvant Chemotherapy   Taxol/Herceptin x 12 completed on 09/29/2017, went on to receive every 3 week adjuvant Herceptin through 06/28/2018   11/25/2017 - 01/07/2018 Radiation Therapy    The patient initially received a dose of 50.4 Gy in 28 fractions to the breast using whole-breast tangent fields. This was delivered using a 3-D conformal technique. The patient then received a boost to the seroma. This delivered an additional 10 Gy in 5 fractions using a 3-D technique. The total dose was 60.4 Gy.   02/15/2018 Genetic Testing   The Common Hereditary Cancer Panel offered by [facility] includes sequencing and/or deletion  duplication testing of the following 47 genes: APC, ATM, AXIN2, BARD1, BMPR1A, BRCA1, BRCA2, BRIP1, CDH1, CDKN2A (p14ARF), CDKN2A (p16INK4a), CKD4, CHEK2, CTNNA1, DICER1, EPCAM (Deletion/duplication testing only), GREM1 (promoter region deletion/duplication testing only), KIT, MEN1, MLH1, MSH2, MSH3, MSH6, MUTYH, NBN, NF1, NHTL1, PALB2, PDGFRA, PMS2, POLD1, POLE, PTEN, RAD50, RAD51C, RAD51D, SDHB, SDHC, SDHD, SMAD4, SMARCA4. STK11, TP53, TSC1, TSC2, and VHL.  The following genes were evaluated for sequence changes only: SDHA and HOXB13 c.251G>A variant only.   Results: POSITIVE. 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 The date of this test report is 02/15/2018   10/20/2019 PET scan   Bone mets involving Right hip, Bil Iliac bones, T8, T9, L2 and L3, Rt prox humerus, Righ glenoid. Some lytic and some sclerotic. Mild Left supraclav adenopathy, 2 small lesions basal ganglia (could be lacunar infarcts)   11/14/2019 - 12/26/2019 Chemotherapy   The patient had palonosetron (ALOXI) injection 0.25 mg, 0.25 mg, Intravenous,  Once, 2 of 5 cycles Administration: 0.25 mg (12/05/2019), 0.25 mg (12/26/2019) ado-trastuzumab emtansine (KADCYLA) 260 mg in sodium chloride 0.9 % 250 mL chemo infusion, 3.6 mg/kg = 260 mg, Intravenous, Once, 3 of 6 cycles Administration: 260 mg (11/14/2019), 260 mg (12/05/2019), 260 mg (12/26/2019)   for chemotherapy treatment.     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  Required TPN briefly.  Patient was able to swallow and keep food down by the time of discharge.   02/06/2020 Miscellaneous   Talazoparib   07/15/2021 -  Chemotherapy  Patient is on Treatment Plan : Intrathecal Methotrexate     Fallopian tube cancer, carcinoma, right ([place])  11/04/2017 Surgery   Uterus, cervix and bilateral fallopian tubes: Fallopian tube  with serous carcinoma 0.6 cm positive for p53, PAX8, WT-1, MOC-31, cytokeratin 7, and estrogen receptor   01/11/2018 -  Chemotherapy   Taxol and carboplatin every 3 weeks (Taxol discontinued with cycle 2 because of profound rash)   10/20/2019 PET scan   Bone mets involving Right hip, Bil Iliac bones, T8, T9, L2 and L3, Rt prox humerus, Righ glenoid. Some lytic and some sclerotic. Mild Left supraclav adenopathy, 2 small lesions basal ganglia (could be lacunar infarcts)   11/14/2019 - 12/26/2019 Chemotherapy   The patient had palonosetron (ALOXI) injection 0.25 mg, 0.25 mg, Intravenous,  Once, 2 of 5 cycles Administration: 0.25 mg (12/05/2019), 0.25 mg (12/26/2019) ado-trastuzumab emtansine (KADCYLA) 260 mg in sodium chloride 0.9 % 250 mL chemo infusion, 3.6 mg/kg = 260 mg, Intravenous, Once, 3 of 6 cycles Administration: 260 mg (11/14/2019), 260 mg (12/05/2019), 260 mg (12/26/2019)   for chemotherapy treatment.     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 with above-mentioned history of metastatic breast cancer who is currently on talazoparib and Xgeva. She presents to the clinic today for follow-up.  She had seen interventional radiology for microwave ablation of the liver and that he is going to be set up soon.  She will be seeing with radiation oncology today to talk about palliative radiation to the brain. She does not have any other new complaints or concerns other than slight imbalance when she walks.  ALLERGIES:  has No Known Allergies.  MEDICATIONS:  Current Outpatient Medications  Medication Sig Dispense Refill   capecitabine (XELODA) 500 MG tablet  Take 2 tablets (1,000 mg total) by mouth 2 (two) times daily after a meal. 56 tablet 3   tucatinib (TUKYSA) 150 MG tablet Take 2 tablets (300 mg total) by mouth 2 (two) times daily. Take every 12 hrs at the same time each day with or without a meal. 120 tablet 6   Accu-Chek FastClix Lancets MISC Use as instructed. Inject into the skin twice daily 100 each 3   atorvastatin (LIPITOR) 40 MG tablet Take 1 tablet (40 mg total) by mouth daily. 90 tablet 1   Blood Glucose Calibration (ACCU-CHEK GUIDE CONTROL) LIQD 1 each by In Vitro route once as needed for up to 1 dose. 1 each 0   cholecalciferol (VITAMIN D) 1000 units tablet Take 1,000 Units by mouth daily.     glucose blood (ACCU-CHEK GUIDE) test strip Use as instructed. Check blood glucose by fingerstick twice per day. 100 each 12   Lancet Devices (ACCU-CHEK SOFTCLIX) lancets Use as instructed 1 each 0   lidocaine-prilocaine (EMLA) cream Apply 1 application topically as needed. 30 g 5   metFORMIN (GLUCOPHAGE) 500 MG tablet TAKE 2 TABLETS BY MOUTH 2 (TWO) TIMES DAILY WITH A MEAL. 360 tablet 1   ondansetron (ZOFRAN ODT) 4 MG disintegrating tablet Take 1 tablet (4 mg total) by mouth every 8 (eight) hours as needed for nausea or vomiting. 30 tablet 0  No current facility-administered medications for this visit.   Facility-Administered Medications Ordered in Other Visits  Medication Dose Route Frequency Provider Last Rate Last Admin   heparin lock flush 100 unit/mL  500 Units Intracatheter Once PRN [name], MD       sodium chloride flush (NS) 0.9 % injection 10 mL  10 mL Intracatheter PRN [name], MD        PHYSICAL EXAMINATION: ECOG PERFORMANCE STATUS: 1 - Symptomatic but completely ambulatory  Vitals:   07/30/21 0754  BP: 129/72  Pulse: 94  Resp: 18  Temp: (!) 97.5 F (36.4 C)  SpO2: 100%   Filed Weights   07/30/21 0754  Weight: 143 lb 9.6 oz (65.1 kg)      LABORATORY DATA:  I have reviewed the data as listed CMP Latest  Ref Rng & Units 07/14/2021 06/02/2021 04/04/2021  Glucose 70 - 99 mg/dL 113(H) 114(H) 95  BUN 6 - 20 mg/dL _0 Creatinine 0.44 - 1.00 mg/dL 0.70 0.67 0.57  Sodium 135 - 145 mmol/L 140 142 141  Potassium 3.5 - 5.1 mmol/L 3.8 3.8 3.8  Chloride 98 - 111 mmol/L 106 107 102  CO2 22 - 32 mmol/L _1 Calcium 8.9 - 10.3 mg/dL 9.4 9.0 9.3  Total Protein 6.5 - 8.1 g/dL 7.7 7.3 7.2  Total Bilirubin 0.3 - 1.2 mg/dL 0.4 0.4 0.3  Alkaline Phos 38 - 126 U/L 83 72 89  AST 15 - 41 U/L _2 ALT 0 - 44 U/L _3 Lab Results  Component Value Date   WBC 6.2 07/14/2021   HGB 10.8 (L) 07/14/2021   HCT 32.8 (L) 07/14/2021   MCV 105.1 (H) 07/14/2021   PLT 135 (L) 07/14/2021   NEUTROABS 4.5 07/14/2021    ASSESSMENT & PLAN:  Malignant neoplasm of lower-outer quadrant of left breast of female, estrogen receptor negative ([facility]) Left lumpectomy: IDC grade 3, 1.2 cm, DCIS is present, margins negative, 0/6 lymph nodes negative, ER 0%, PR 0%, HER-2 positive ratio 2.28, Ki-67 20%, T1 BN 0 stage IA S/P Taxol-Herceptin BRCA 2 Mutation Positive   10/20/19: Bone mets involving Right hip, Bil Iliac bones, T8, T9, L2 and L3, Rt prox humerus, Righ glenoid. Some lytic and some sclerotic. Mild Left supraclav adenopathy, 2 small lesions basal ganglia (could be lacunar infarcts)   Biopsy the supra clav LN: 10/31/2019: Metastatic breast cancer, ER/PR negative HER-2 equivocal by IHC, FISH positive Treatment summary: Kadcyla every 3-week treatment.  Discontinued for progression   MRI of the back 01/07/2020: Extensive metastatic disease to the involved spine.  Tumor replacement of L3 vertebral body with pathologic compression fracture, epidural and extraosseous extension.  Tumor extending throughout L3 foramen Status post palliative radiation therapy Causing severe dysphagia and dehydration and hospitalization  01/29/2020-02/03/2020 ------------------------------------------------------------------------------------------------------------------------------------------------------ Echocardiogram 11/14/2019 Current treatment: Talazoparib started 02/07/2020   Toxicities: Occasional mild nausea   Monitoring closely for cytopenias. Anemia:   Thrombocytopenia:    Current dosage of talazoparib to 0.5 mg started 09/20/2020 CT CAP 09/20/19: No evidence of ST mets, tiny inferior right hepatic lesion substantially decreased (3 mm), bone mets have become sclerotic suggest healing CT CAP 12/26/2020: Stable diffuse bone mets CT CAP 07/01/2021: Stable bone metastases.  New liver lesion 1.5 cm Brain MRI 07/12/2021: Abnormal cerebellar signal suspicious for leptomeningeal spread. Lumbar puncture 07/15/2021: Highly suspicious for leptomeningeal carcinomatosis   Recommendation:  1. Microwave ablation of the liver met 2. palliative radiation to the brain:  Brain tumor board did not recommend intrathecal chemo 3.  Since she was HER2 positive, recommended switching treatment to Tucatinib plus Xeloda plus Herceptin based on HER-2 Climb study  Return to clinic on 08/14/2021 to start Herceptin.    No orders of the defined types were placed in this encounter.  The patient has a good understanding of the overall plan. she agrees with it. she will call with any problems that may develop before the next visit here.  Total time spent: 45 mins including face to face time and time spent for planning, charting and coordination of care  Rulon Eisenmenger, MD, MPH 07/30/2021  I, Thana Ates, am acting as scribe for [name].  I have reviewed the above documentation for accuracy and completeness, and I agree with the above.

## 2021-07-30 ENCOUNTER — Telehealth: Payer: Self-pay

## 2021-07-30 ENCOUNTER — Ambulatory Visit
Admission: RE | Admit: 2021-07-30 | Discharge: 2021-07-30 | Disposition: A | Discharge status: Home / Self Care | Payer: Medicare Other | Source: Ambulatory Visit | Attending: Radiation Oncology | Admitting: Radiation Oncology

## 2021-07-30 ENCOUNTER — Inpatient Hospital Stay (HOSPITAL_BASED_OUTPATIENT_CLINIC_OR_DEPARTMENT_OTHER): Payer: Medicare Other | Admitting: Hematology and Oncology

## 2021-07-30 ENCOUNTER — Telehealth: Payer: Self-pay | Admitting: Nurse Practitioner

## 2021-07-30 ENCOUNTER — Encounter: Payer: Self-pay | Admitting: Radiation Oncology

## 2021-07-30 ENCOUNTER — Telehealth: Payer: Self-pay | Admitting: *Deleted

## 2021-07-30 ENCOUNTER — Other Ambulatory Visit (HOSPITAL_COMMUNITY): Payer: Self-pay

## 2021-07-30 ENCOUNTER — Other Ambulatory Visit: Payer: Self-pay

## 2021-07-30 ENCOUNTER — Other Ambulatory Visit: Payer: Self-pay | Admitting: *Deleted

## 2021-07-30 VITALS — BP 118/68 | HR 87 | Temp 97.8°F | Resp 18 | Ht 61.0 in | Wt 144.6 lb

## 2021-07-30 VITALS — BP 129/72 | HR 94 | Temp 97.5°F | Resp 18 | Ht 61.0 in | Wt 143.6 lb

## 2021-07-30 DIAGNOSIS — C50512 Malignant neoplasm of lower-outer quadrant of left female breast: Secondary | ICD-10-CM

## 2021-07-30 DIAGNOSIS — Z170 Estrogen receptor positive status [ER+]: Secondary | ICD-10-CM | POA: Insufficient documentation

## 2021-07-30 DIAGNOSIS — C5700 Malignant neoplasm of unspecified fallopian tube: Secondary | ICD-10-CM | POA: Insufficient documentation

## 2021-07-30 DIAGNOSIS — C7949 Secondary malignant neoplasm of other parts of nervous system: Secondary | ICD-10-CM | POA: Diagnosis not present

## 2021-07-30 DIAGNOSIS — Z171 Estrogen receptor negative status [ER-]: Secondary | ICD-10-CM

## 2021-07-30 DIAGNOSIS — E119 Type 2 diabetes mellitus without complications: Secondary | ICD-10-CM | POA: Diagnosis not present

## 2021-07-30 DIAGNOSIS — Z1501 Genetic susceptibility to malignant neoplasm of breast: Secondary | ICD-10-CM | POA: Insufficient documentation

## 2021-07-30 DIAGNOSIS — I10 Essential (primary) hypertension: Secondary | ICD-10-CM | POA: Insufficient documentation

## 2021-07-30 DIAGNOSIS — E785 Hyperlipidemia, unspecified: Secondary | ICD-10-CM | POA: Diagnosis not present

## 2021-07-30 DIAGNOSIS — Z7189 Other specified counseling: Secondary | ICD-10-CM | POA: Diagnosis not present

## 2021-07-30 DIAGNOSIS — H9193 Unspecified hearing loss, bilateral: Secondary | ICD-10-CM | POA: Diagnosis not present

## 2021-07-30 DIAGNOSIS — K449 Diaphragmatic hernia without obstruction or gangrene: Secondary | ICD-10-CM | POA: Insufficient documentation

## 2021-07-30 DIAGNOSIS — C7951 Secondary malignant neoplasm of bone: Secondary | ICD-10-CM | POA: Diagnosis not present

## 2021-07-30 DIAGNOSIS — Z79899 Other long term (current) drug therapy: Secondary | ICD-10-CM

## 2021-07-30 DIAGNOSIS — Z5181 Encounter for therapeutic drug level monitoring: Secondary | ICD-10-CM

## 2021-07-30 DIAGNOSIS — C770 Secondary and unspecified malignant neoplasm of lymph nodes of head, face and neck: Secondary | ICD-10-CM

## 2021-07-30 DIAGNOSIS — I700 Atherosclerosis of aorta: Secondary | ICD-10-CM | POA: Diagnosis not present

## 2021-07-30 MED ORDER — ONDANSETRON HCL 8 MG PO TABS
8.0000 mg | ORAL_TABLET | Freq: Two times a day (BID) | ORAL | 1 refills | Status: DC | PRN
  Filled 2021-07-30: qty 30, 15d supply, fill #0

## 2021-07-30 MED ORDER — MEMANTINE HCL 5 MG PO TABS: ORAL_TABLET | ORAL | 0 refills | Status: DC

## 2021-07-30 MED ORDER — TUCATINIB 150 MG PO TABS
300.0000 mg | ORAL_TABLET | Freq: Two times a day (BID) | ORAL | 6 refills | Status: DC
  Filled 2021-07-30 – 2021-08-12 (×2): qty 120, 30d supply, fill #0
  Filled 2021-09-04: qty 120, 30d supply, fill #1

## 2021-07-30 MED ORDER — LIDOCAINE-PRILOCAINE 2.5-2.5 % EX CREA
TOPICAL_CREAM | CUTANEOUS | 3 refills | Status: AC
  Filled 2021-07-30: qty 30, 30d supply, fill #0

## 2021-07-30 MED ORDER — PROCHLORPERAZINE MALEATE 10 MG PO TABS
10.0000 mg | ORAL_TABLET | Freq: Four times a day (QID) | ORAL | 1 refills | Status: DC | PRN
  Filled 2021-07-30: qty 30, 8d supply, fill #0

## 2021-07-30 MED ORDER — CAPECITABINE 500 MG PO TABS
1000.0000 mg | ORAL_TABLET | Freq: Two times a day (BID) | ORAL | 3 refills | Status: DC
  Filled 2021-07-30: qty 56, 14d supply, fill #0

## 2021-07-30 MED ORDER — MEMANTINE HCL 10 MG PO TABS: 10.0000 mg | ORAL_TABLET | Freq: Two times a day (BID) | ORAL | 4 refills | Status: AC

## 2021-07-30 NOTE — Telephone Encounter (Signed)
Oral Oncology Pharmacist Encounter  Received new prescription for capecitabine (Xeloda) and tucatinib Laury Axon) for the treatment of metastatic hormone receptor negative, HER2 positive breast cancer in conjunction with Herceptin, planned duration until disease progression or unacceptable toxicity. Medication dose and frequency assessed, xeloda dosing decreased per MD discretion.   Labs from 07/14/2021 (CBC and CMP) assessed, no interventions needed. Patient will have baseline ECHO done on 08/14/21.  Current medication list in Epic reviewed, DDIs with xeloda and tukysa identified: - Tukysa interacts with both atorvastatin and metformin. Laury Axon is a strong CYP3A4 inhibitor which increases the concentration of atorvastatin so will monitor for increased adverse effects with atorvastatin (myopathy/ myalgias). Laury Axon can increase the concentration of metformin - can increase metformin effects and toxicities including lactic acidosis.  Will need to verify patient is still taking medication.       Blood glucose 2 weeks previously (07/14/21) showed 113 mg/dL.   Evaluated chart and no patient barriers to medication adherence noted.   Patient agreement for treatment documented in MD note on 07/30/2021.  Prescription has been e-scribed to the [facility] for benefits analysis and approval.  Oral Oncology Clinic will continue to follow for insurance authorization, copayment issues, initial counseling and start date.  Drema Halon, PharmD Hematology/Oncology Clinical Pharmacist [facility] [phone] 07/30/2021 9:18 AM

## 2021-07-30 NOTE — Progress Notes (Signed)
DISCONTINUE ON PATHWAY REGIMEN - Breast     A cycle is every 21 days:     Ado-trastuzumab emtansine   **Always confirm dose/schedule in your pharmacy ordering system**  REASON: Disease Progression PRIOR TREATMENT: [id number]: Ado-trastuzumab Emtansine 3.6 mg/kg q21 Days TREATMENT RESPONSE: Unable to Evaluate  START ON PATHWAY REGIMEN - Breast     Cycle 1: A cycle is 21 days:     Capecitabine      Tucatinib      Trastuzumab-xxxx    Cycles 2 and beyond: A cycle is every 21 days:     Capecitabine      Tucatinib      Trastuzumab-xxxx   **Always confirm dose/schedule in your pharmacy ordering system**  Patient Characteristics: Distant Metastases or Locoregional Recurrent Disease - Unresected or Locally Advanced Unresectable Disease Progressing after Neoadjuvant and Local Therapies, HER2 Positive, ER Negative/Unknown, Chemotherapy, Fourth Line and Beyond, Fam-trastuzumab  Deruxtecan or Tucatinib-based Therapy Indicated Therapeutic Status: Distant Metastases HER2 Status: Positive (+) ER Status: Negative (-) PR Status: Negative (-) Line of Therapy: Fourth Line and Beyond Therapy Indicated: Fam-trastuzumab Deruxtecan or Tucatinib-based Therapy Indicated Intent of Therapy: Non-Curative / Palliative Intent, Not Discussed with Patient

## 2021-07-30 NOTE — Progress Notes (Signed)
Radiation Oncology         ([phone]) [phone] ________________________________  Name: [name]        MRN: [id number]  Date of Service: 07/30/2021 DOB: 05/08/1961  DZ:[id number], Erin Buff, NP  [name], MD     REFERRING PHYSICIAN: [name], MD   DIAGNOSIS: The primary encounter diagnosis was Malignant neoplasm of lower-outer quadrant of left breast of female, estrogen receptor negative ([place]). 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   HISTORY OF PRESENT ILLNESS: Erin [name] is a 60 y.o. female with a history of Recurrent Metatastatic Stage IA, pT1cN0M0 grade 3, ER/PR negative, HER2 amplified, invasive ductal carcinoma with DCIS of the left breast.  She was originally diagnosed with her cancer in 2018, she underwent left lumpectomy adjuvant chemotherapy and adjuvant radiotherapy.  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  She was recently receiving talazoparib, but recent restaging CT scan in October 2022 showed concerns for new disease in her right liver.  She was also counseled on the need for MRI of the brain and MRI of the abdomen.  Dr. [name] met with her and is going to discuss further options of microwave ablation to the liver lesion, her MRI brain unfortunately showed abnormal cerebellar signal suspicious for leptomeningeal disease.  Lumbar puncture on 07/24/2021 was performed and cytology is concerning for atypical cells highly suspicious for metastatic carcinoma.  Since her last visit she has been found to have leptomeningeal disease by MRI on 07/10/2021.  She underwent lumbar puncture on 07/24/2021 and cytology showed atypical cells and rare atypical cells suspicious for metastatic carcinoma.  She began intrathecal methotrexate  on 07/15/2021.  She is seen  today to discuss palliative radiotherapy.  She also had an MRI on 07/21/2021 of the abdomen that showed a 1.5 cm solitary lesion in the right hepatic lobe.  Dr. [name] who performed her lumbar puncture also has plans to offer microwave ablation.  She is seen today to discuss palliative brain radiotherapy.  She is planning to resume Herceptin and to forego additional intrathecal treatment.   PREVIOUS RADIATION THERAPY:   06/03/20-06/14/20:  The left femur target was treated to 30 Gy in 10 fractions.   01/01/20-01/19/20: The bilateral iliac bones, right proximal femur, and L2-3 and T8 sites were each treated to 37.5 Gy in 15 fractions.   11/25/2017 - 01/10/2018: The patient initially received a dose of 50.4 Gy in 28 fractions to the breast using whole-breast tangent fields. This was delivered using a 3-D conformal technique. The patient then received a boost to the seroma. This delivered an additional 10 Gy in 5 fractions using a 3-D technique. The total dose was 60.4 Gy.  PAST MEDICAL HISTORY:  Past Medical History:  Diagnosis Date   Arthritis    knees, elbows   Borderline glaucoma of right eye    BRCA2 gene mutation positive 02/17/2018   BRCA2 c.4552del (p.Glu1518Asnfs*25)  Result reported out on 02/15/2018.    Breast cancer ([place]) 05/2017   left   Chronic back pain    Deaf    per pt born hearing and at age 60 1/2 lost hearing , was told by mother unknown cause, can miminally hear in left and no hearing on right    Depression    Dyspnea    states some  SOB with ADLs and anemia    Elevated cancer antigen 125 (CA 125)    Family history of breast cancer    Family history of breast cancer    Family history of colon cancer    Genetic testing 02/17/2018   The Common Hereditary Cancer Panel offered by [facility] includes sequencing and/or deletion duplication testing of the following 47 genes: APC, ATM, AXIN2, BARD1, BMPR1A, BRCA1, BRCA2, BRIP1, CDH1, CDKN2A (p14ARF), CDKN2A (p16INK4a), CKD4, CHEK2,  CTNNA1, DICER1, EPCAM (Deletion/duplication testing only), GREM1 (promoter region deletion/duplication testing only), KIT, MEN1, MLH1, MSH2, MSH3, MSH6, MU   History of cancer chemotherapy    left breast cancer 09-08-2017 to 10-09-2017;  fallopion tube cancer  12-15-2017  to 06-28-2018   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 oncologist-  dr [name]   11-04-2017  s/p  LAVH w/ BSO,  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;  completed chemo 06-28-2018   History of external beam radiation therapy    left breast  11-25-2017  to 01-07-2018   Hyperlipidemia    Hypertension    followed by pcp   (10-05-2019  per pt never had stress test)   IDA (iron deficiency anemia)    Malignant neoplasm of lower-outer quadrant of left breast of female, estrogen receptor negative [facility]) oncologist-- dr [name]   dx 08/ 2018--- Stage IA, DCIS,  ER/ PR negative,  HER-2 positive;  06-09-2017 s/p left breast lumpectomy with node dissection;  completed chemo 10-09-2017  and radiation 01-07-2018/  hercepton completed 06-28-2018   Non-insulin dependent type 2 diabetes mellitus ([place])    followed by pcp   (10-05-2019 per pt check cbg every other day in AM,  fasting cbg-- 105)   Numbness of right thumb    Wears glasses    PAST SURGICAL HISTORY: Past Surgical History:  Procedure Laterality Date   BREAST LUMPECTOMY WITH RADIOACTIVE SEED AND SENTINEL LYMPH NODE BIOPSY Left 06/09/2017   Procedure: BREAST LUMPECTOMY WITH RADIOACTIVE SEED AND SENTINEL LYMPH NODE BIOPSY;  Surgeon: Excell Seltzer, MD;  Location: [facility];  Service: General;  Laterality: Left;   COLONOSCOPY  11/02/2017   polyps   HERNIA REPAIR     HIATAL HERNIA REPAIR  09/2019   IR RADIOLOGIST EVAL & MGMT  07/09/2021   IR RADIOLOGIST EVAL & MGMT  07/24/2021   LAPAROSCOPIC ASSISTED VAGINAL HYSTERECTOMY N/A 11/04/2017   Procedure: LAPAROSCOPIC ASSISTED VAGINAL HYSTERECTOMY;  Surgeon: [name],  MD;  Location: [facility];  Service: Gynecology;  Laterality: N/A;   LAPAROSCOPIC BILATERAL SALPINGO OOPHERECTOMY Bilateral 11/04/2017   Procedure: LAPAROSCOPIC BILATERAL SALPINGO OOPHORECTOMY;  Surgeon: [name], MD;  Location: [facility];  Service: Gynecology;  Laterality: Bilateral;   LAPAROSCOPY N/A 10/09/2019   Procedure: LAPAROSCOPY DIAGNOSTIC WITH PERITONEAL BIOPSIES;  Surgeon: [name], MD;  Location: [facility];  Service: Gynecology;  Laterality: N/A;   PORTACATH PLACEMENT Right 06/09/2017   Procedure: INSERTION PORT-A-CATH WITH [place];  Surgeon: Excell Seltzer, MD;  Location: [facility];  Service: General;  Laterality: Right;   PORTACATH PLACEMENT Right 11/13/2019   Procedure: INSERTION PORT-A-CATH WITH ULTRASOUND GUIDANCE;  Surgeon: Rolm Bookbinder, MD;  Location: [facility];  Service: General;  Laterality: Right;   TUBAL LIGATION  02/02/2002   @[facility]    [facility]   UPPER GI ENDOSCOPY       FAMILY HISTORY:  Family History  Problem Relation Age of Onset   Hypertension Mother  Lupus Mother    Diabetes Father    Hypertension Sister    Diabetes Paternal Grandmother    Colon cancer Paternal Grandmother 21   CAD Brother    Diabetes Brother    Heart attack Brother    Breast cancer Maternal Aunt        dx >50   Heart attack Paternal Grandfather    Breast cancer Maternal Aunt        dx under 32   Breast cancer Maternal Aunt        dx  under 50     SOCIAL HISTORY:  reports that she has never smoked. She has never used smokeless tobacco. She reports that she does not drink alcohol and does not use drugs. The patient is separated and lives in [place]. She is currently unemployed. 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   ALLERGIES: Patient has no known allergies.   MEDICATIONS:  Current Outpatient Medications  Medication Sig Dispense Refill   Accu-Chek FastClix Lancets MISC Use as instructed. Inject into the  skin twice daily 100 each 3   atorvastatin (LIPITOR) 40 MG tablet Take 1 tablet (40 mg total) by mouth daily. 90 tablet 1   Blood Glucose Calibration (ACCU-CHEK GUIDE CONTROL) LIQD 1 each by In Vitro route once as needed for up to 1 dose. 1 each 0   cholecalciferol (VITAMIN D) 1000 units tablet Take 1,000 Units by mouth daily.     glucose blood (ACCU-CHEK GUIDE) test strip Use as instructed. Check blood glucose by fingerstick twice per day. 100 each 12   Lancet Devices (ACCU-CHEK SOFTCLIX) lancets Use as instructed 1 each 0   lidocaine-prilocaine (EMLA) cream Apply 1 application topically as needed. 30 g 5   metFORMIN (GLUCOPHAGE) 500 MG tablet TAKE 2 TABLETS BY MOUTH 2 (TWO) TIMES DAILY WITH A MEAL. 360 tablet 1   ondansetron (ZOFRAN ODT) 4 MG disintegrating tablet Take 1 tablet (4 mg total) by mouth every 8 (eight) hours as needed for nausea or vomiting. 30 tablet 0   talazoparib tosylate (TALZENNA) 0.25 MG capsule Take 2 capsules (0.5 mg total) by mouth daily. 60 capsule 1   No current facility-administered medications for this encounter.   Facility-Administered Medications Ordered in Other Encounters  Medication Dose Route Frequency Provider Last Rate Last Admin   heparin lock flush 100 unit/mL  500 Units Intracatheter Once PRN [name], MD       sodium chloride flush (NS) 0.9 % injection 10 mL  10 mL Intracatheter PRN [name], MD         REVIEW OF SYSTEMS:On review of systems, the patient reports that she is not having any confusion, headaches, visual, disturbances.  She states that she has had a few episodes but not anything persistent in terms of balance.  She denies walking into walls or into her furniture at home.  She states that she is not having any episodes of confusion, and no behavioral changes have been noted by her mother.  No other complaints are verbalized.     PHYSICAL EXAM:  Wt Readings from Last 3 Encounters:  07/14/21 143 lb 1.6 oz (64.9 kg)  07/02/21 144  lb 14.4 oz (65.7 kg)  06/02/21 144 lb 12.8 oz (65.7 kg)   Temp Readings from Last 3 Encounters:  07/15/21 98.6 F (37 C) (Oral)  07/14/21 (!) 97.5 F (36.4 C) (Temporal)  07/02/21 (!) 97.3 F (36.3 C) (Temporal)   BP Readings from Last 3 Encounters:  07/24/21 126/75  07/15/21 117/66  07/14/21 138/77   Pulse Readings from Last 3 Encounters:  07/24/21 (!) 109  07/15/21 78  07/14/21 (!) 102     In general this is a tired chronically ill appearing African American female in no acute distress. She's alert and oriented x4 and appropriate throughout the examination. Cardiopulmonary assessment is negative for acute distress and she exhibits normal effort.     ECOG = 0  0 - Asymptomatic (Fully active, able to carry on all predisease activities without restriction)  1 - Symptomatic but completely ambulatory (Restricted in physically strenuous activity but ambulatory and able to carry out work of a light or sedentary nature. For example, light housework, office work)  2 - Symptomatic, <50% in bed during the day (Ambulatory and capable of all self care but unable to carry out any work activities. Up and about more than 50% of waking hours)  3 - Symptomatic, >50% in bed, but not bedbound (Capable of only limited self-care, confined to bed or chair 50% or more of waking hours)  4 - Bedbound (Completely disabled. Cannot carry on any self-care. Totally confined to bed or chair)  5 - Death   Eustace Pen MM, Creech RH, Tormey DC, et al. [phone]). "Toxicity and response criteria of the [facility]". [place] Oncol. 5 (6): 649-55    LABORATORY DATA:  Lab Results  Component Value Date   WBC 6.2 07/14/2021   HGB 10.8 (L) 07/14/2021   HCT 32.8 (L) 07/14/2021   MCV 105.1 (H) 07/14/2021   PLT 135 (L) 07/14/2021   Lab Results  Component Value Date   NA 140 07/14/2021   K 3.8 07/14/2021   CL 106 07/14/2021   CO2 27 07/14/2021   Lab Results  Component Value Date    ALT 21 07/14/2021   AST 17 07/14/2021   ALKPHOS 83 07/14/2021   BILITOT 0.4 07/14/2021      RADIOGRAPHY: MR Brain W Wo Contrast  Result Date: 07/12/2021 CLINICAL DATA:  Breast cancer, staging; imbalance with walking with met breast cancer EXAM: MRI HEAD WITHOUT AND WITH CONTRAST TECHNIQUE: Multiplanar, multiecho pulse sequences of the brain and surrounding structures were obtained without and with intravenous contrast. CONTRAST:  31m GADAVIST GADOBUTROL 1 MMOL/ML IV SOLN COMPARISON:  February 2021 FINDINGS: Brain: There is no acute infarction or intracranial hemorrhage. There is no intracranial mass or mass effect. There is no hydrocephalus or extra-axial fluid collection. Ventricles and sulci are normal in size and configuration. 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 There is ill-defined, patchy T2 hyperintensity of the cerebellum. Corresponding ill-defined enhancement is present. This is favored to be leptomeningeal. Vascular: Major vessel flow voids at the skull base are preserved. Skull and upper cervical spine: Normal marrow signal is preserved. Sinuses/Orbits: Lobular right maxillary sinus mucosal thickening. Orbits are unremarkable. Other: Sella is unremarkable.  Mastoid air cells are clear. IMPRESSION: Abnormal cerebellar signal suspicious for leptomeningeal spread. CSF sampling is recommended. 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 These results will be called to the ordering clinician or representative by the Radiologist Assistant, and communication documented in the PACS or [employer] Electronically Signed   By: PMacy MisM.D.   On: 07/12/2021 09:47   MR ABDOMEN WWO CONTRAST  Result Date: 07/21/2021 CLINICAL DATA:  60year old female with history of breast cancer. New lesion in the liver noted on prior CT of the chest, abdomen and pelvis 07/01/2021, concerning for  possible metastatic lesion. Follow-up study. EXAM: MRI ABDOMEN  WITHOUT AND WITH CONTRAST TECHNIQUE: Multiplanar multisequence MR imaging of the abdomen was performed both before and after the administration of intravenous contrast. CONTRAST:  72m GADAVIST GADOBUTROL 1 MMOL/ML IV SOLN COMPARISON:  None. FINDINGS: Lower chest: Small hiatal hernia. Hepatobiliary: In the central aspect of the right lobe of the liver centered between segments 6 and 7 (axial image 37 of series 16) measuring 1.5 x 1.3 cm, which is T1 hypointense, mildly T2 hyperintense and hypovascular with low level progressive enhancement which nearly normalizes to normal hepatic parenchyma on delayed images, in addition to definitive diffusion restriction, indicative of a metastatic lesion. No other hepatic lesions are noted elsewhere. No intra or extrahepatic biliary ductal dilatation. Gallbladder is normal in appearance. Pancreas: No pancreatic mass. No pancreatic ductal dilatation. No pancreatic or peripancreatic fluid collections or inflammatory changes. Spleen:  Unremarkable. Adrenals/Urinary Tract: Bilateral kidneys and adrenal glands are normal in appearance. No hydroureteronephrosis noted in the visualized portions of the abdomen. Stomach/Bowel: Visualized portions are unremarkable. Vascular/Lymphatic: Aortic atherosclerosis, without evidence of aneurysm or dissection in the abdominal vasculature. No lymphadenopathy noted in the abdomen. Other: No significant volume of ascites noted in the visualized portions of the peritoneal cavity. Musculoskeletal: Multiple signal abnormalities are noted throughout the visualized axial spine, compatible with known metastatic disease. IMPRESSION: 1. Previously noted lesion of concern in the right lobe of the liver has imaging characteristics compatible with a metastatic lesion. No other hepatic lesions are identified. 2. Widespread metastatic disease to the bones redemonstrated. 3. Small hiatal hernia. Electronically Signed   By: DVinnie LangtonM.D.   On: 07/21/2021  12:48   CT CHEST ABDOMEN PELVIS W CONTRAST  Result Date: 07/02/2021 CLINICAL DATA:  Metastatic breast cancer restaging EXAM: CT CHEST, ABDOMEN, AND PELVIS WITH CONTRAST TECHNIQUE: Multidetector CT imaging of the chest, abdomen and pelvis was performed following the standard protocol during bolus administration of intravenous contrast. CONTRAST:  874mOMNIPAQUE IOHEXOL 350 MG/ML SOLN, additional oral enteric contrast COMPARISON:  12/25/2020, 09/18/2020, 01/01/2020 FINDINGS: CT CHEST FINDINGS Cardiovascular: Right chest catheter aortic atherosclerosis. Normal heart size. No pericardial effusion. Mediastinum/Nodes: No enlarged mediastinal, hilar, or axillary lymph nodes. Small hiatal hernia. Thyroid gland, trachea, and esophagus demonstrate no significant findings. Lungs/Pleura: Paramedian fibrosis of the lower lobes (series 4, image 77). No pleural effusion or pneumothorax. Musculoskeletal: No chest wall mass. CT ABDOMEN PELVIS FINDINGS Hepatobiliary: There is a new hypodense lesion of the posterior right lobe of the liver, hepatic segment V, measuring 1.5 x 1.5 cm (series 2, image 48). No gallstones, gallbladder wall thickening, or biliary dilatation. Pancreas: Unremarkable. No pancreatic ductal dilatation or surrounding inflammatory changes. Spleen: Normal in size without significant abnormality. Adrenals/Urinary Tract: Adrenal glands are unremarkable. Kidneys are normal, without renal calculi, solid lesion, or hydronephrosis. Bladder is unremarkable. Stomach/Bowel: Stomach is within normal limits. Appendix appears normal. No evidence of bowel wall thickening, distention, or inflammatory changes. Vascular/Lymphatic: No significant vascular findings are present. No enlarged abdominal or pelvic lymph nodes. Reproductive: Status post hysterectomy. Other: No abdominal wall hernia or abnormality. No abdominopelvic ascites. Musculoskeletal: Unchanged, widespread sclerotic osseous metastatic disease throughout.  IMPRESSION: 1. There is a new hypodense lesion of the posterior right lobe of the liver, hepatic segment V, measuring 1.5 x 1.5 cm, consistent with new hepatic metastatic disease. Note that this lesion is not at the site of previously visualized liver lesions. 2. Unchanged, widespread sclerotic osseous metastatic disease throughout. 3. No evidence of metastatic disease within the chest.  Aortic Atherosclerosis (ICD10-I70.0). Electronically Signed   By: Delanna Ahmadi M.D.   On: 07/02/2021 11:00   IR Radiologist Eval & Mgmt  Result Date: 07/24/2021 Please refer to notes tab for details about interventional procedure. (Op Note)  IR Radiologist Eval & Mgmt  Result Date: 07/09/2021 Please refer to notes tab for details about interventional procedure. (Op Note)       IMPRESSION/PLAN: 1. Recurrent Metatastatic Stage IA, pT1cN0M0 grade 3, ER/PR negative, HER2 amplified, invasive ductal carcinoma with DCIS of the left breast. Dr. Lisbeth Renshaw discusses the pathology findings and reviews the nature of leptomeningeal disease and the rationale for whole brain radiation.  Dr. Lisbeth Renshaw recommends a complete spinal MRI to rule out drop metastases of the spinal cord. If there was other disease, this would be including in radiotherapy treatment. We discussed the risks, benefits, short, and long term effects of radiotherapy, as well as the palliative intent, and the patient is interested in proceeding. Dr. Lisbeth Renshaw discusses the delivery and logistics of radiotherapy and anticipates a course of 2 weeks of radiotherapy to the whole brain. We will follow up with the results as well of her MRI, and I will call her with this once we have completed this. Written consent is obtained and placed in the chart, a copy was provided to the patient. She will be contacted as well for simulation.   2. Cognitive Deficits. Dr. Lisbeth Renshaw discusses the risks of cognitive deficits and the role of Namenda to reduce risks of deficits for patients who have  whole brain radiation. We reviewed side effect profile of this and I've sent in a new prescription to her pharmacy. 3. Fallopian tube carcinoma.  This continues to be followed along with her known breast disease under Dr. Geralyn Flash care.   In a visit lasting 60 minutes, greater than 50% of the time was spent face to face discussing the patient's condition, in preparation for the discussion, and coordinating the patient's care.   The above documentation reflects my direct findings during this shared patient visit. Please see the separate note by Dr. Lisbeth Renshaw on this date for the remainder of the patient's plan of care.    Carola Rhine, PAC

## 2021-07-30 NOTE — Assessment & Plan Note (Signed)
Left lumpectomy: IDC grade 3, 1.2 cm, DCIS is present, margins negative, 0/6 lymph nodes negative, ER 0%, PR 0%, HER-2 positive ratio 2.28, Ki-67 20%, T1 BN 0 stage IA S/P Taxol-Herceptin BRCA 2 Mutation Positive  10/20/19:Bone mets involving Right hip, Bil Iliac bones, T8, T9, L2 and L3, Rt prox humerus, Righ glenoid. Some lytic and some sclerotic. Mild Left supraclav adenopathy, 2 small lesions basal ganglia (could be lacunar infarcts)  Biopsy the supra clav LN: 10/31/2019: Metastatic breast cancer, ER/PR negative HER-2 equivocal by IHC, FISHpositive Treatmentsummary: Kadcyla every 3-week treatment.Discontinued for progression  MRI of the back 01/07/2020: Extensive metastatic disease to the involved spine. Tumor replacement of L3 vertebral body with pathologic compression fracture, epidural and extraosseous extension. Tumor extending throughout L3 foramen Status post palliative radiation therapyCausing severe dysphagia and dehydration and hospitalization5/17/2021-02/03/2020 ------------------------------------------------------------------------------------------------------------------------------------------------------ Echocardiogram 11/14/2019 Current treatment:Talazoparib started 02/07/2020  Toxicities: Occasional mild nausea  Monitoring closely for cytopenias. Anemia:  Thrombocytopenia:   Currentdosage of talazoparib to0.5 mgstarted 09/20/2020 CT CAP 09/20/19: No evidence of ST mets, tiny inferior right hepatic lesion substantially decreased(3 mm), bone mets have become sclerotic suggest healing CT CAP 12/26/2020:Stable diffuse bone mets CT CAP 07/01/2021: Stable bone metastases. New liver lesion 1.5 cm Brain MRI 07/12/2021: Abnormal cerebellar signal suspicious for leptomeningeal spread. Lumbar puncture 07/15/2021: Highly suspicious for leptomeningeal carcinomatosis  Recommendation:  1. Microwave ablation of the liver met 2. palliative radiation to the brain: Brain  tumor board did not recommend intrathecal chemo 3.  Since she was HER2 positive, recommended switching treatment to Tucatinib plus Xeloda plus Herceptin based on HER-2 Climb study  Return to clinic after ablation and radiation to start treatment

## 2021-07-30 NOTE — Telephone Encounter (Signed)
Pt returned call about freestyle Aeronautical engineer / she stated it works fine and helps a lot/   Pt stated that she wanted to let her PCP know that she would like a refill for Cinnamon and stated she will rather not take the Metformin and just take the cinnamon / please advise

## 2021-07-30 NOTE — Progress Notes (Signed)
Patient reports occasional diarrhea and dizziness. No other symptoms reported at this time.  Hysterectomy-No chances of pregnancy. Meaningful use complete.  BP 118/68 (BP Location: Right Arm, Patient Position: Sitting, Cuff Size: Normal)   Pulse 87   Temp 97.8 F (36.6 C)   Resp 18   Ht 5\' 1"  (1.549 m)   Wt 144 lb 9.6 oz (65.6 kg)   LMP 07/09/2017 (LMP Unknown)   SpO2 100%   BMI 27.32 kg/m

## 2021-07-30 NOTE — Telephone Encounter (Signed)
See encounter

## 2021-07-30 NOTE — Telephone Encounter (Signed)
Called patient to inform of MRI for 08-02-21- arrival time- 11:30 am @ [facility], no restrictions to test, spoke with patient's mom and she is aware of this test

## 2021-07-31 NOTE — Telephone Encounter (Signed)
Made pt a tele visit with Raul Del on Tuesday at 4:10pm

## 2021-08-02 ENCOUNTER — Ambulatory Visit (HOSPITAL_COMMUNITY)
Admission: RE | Admit: 2021-08-02 | Discharge: 2021-08-02 | Disposition: A | Discharge status: Home / Self Care | Payer: Medicaid Other | Source: Ambulatory Visit | Attending: Radiation Oncology | Admitting: Radiation Oncology

## 2021-08-02 DIAGNOSIS — C50512 Malignant neoplasm of lower-outer quadrant of left female breast: Secondary | ICD-10-CM | POA: Diagnosis not present

## 2021-08-02 DIAGNOSIS — Z171 Estrogen receptor negative status [ER-]: Secondary | ICD-10-CM | POA: Diagnosis not present

## 2021-08-02 DIAGNOSIS — M2578 Osteophyte, vertebrae: Secondary | ICD-10-CM | POA: Diagnosis not present

## 2021-08-02 DIAGNOSIS — M5021 Other cervical disc displacement,  high cervical region: Secondary | ICD-10-CM | POA: Diagnosis not present

## 2021-08-02 DIAGNOSIS — M47816 Spondylosis without myelopathy or radiculopathy, lumbar region: Secondary | ICD-10-CM | POA: Diagnosis not present

## 2021-08-02 DIAGNOSIS — G9520 Unspecified cord compression: Secondary | ICD-10-CM | POA: Diagnosis not present

## 2021-08-02 DIAGNOSIS — M48061 Spinal stenosis, lumbar region without neurogenic claudication: Secondary | ICD-10-CM | POA: Diagnosis not present

## 2021-08-02 DIAGNOSIS — C7949 Secondary malignant neoplasm of other parts of nervous system: Secondary | ICD-10-CM | POA: Diagnosis not present

## 2021-08-02 MED ORDER — GADOBUTROL 1 MMOL/ML IV SOLN
6.5000 mL | Freq: Once | INTRAVENOUS | Status: AC | PRN
  Administered 2021-08-02: 6.5 mL via INTRAVENOUS

## 2021-08-04 ENCOUNTER — Ambulatory Visit
Admission: RE | Admit: 2021-08-04 | Discharge: 2021-08-04 | Disposition: A | Discharge status: Home / Self Care | Payer: Medicaid Other | Source: Ambulatory Visit | Attending: Radiation Oncology | Admitting: Radiation Oncology

## 2021-08-04 ENCOUNTER — Other Ambulatory Visit: Payer: Self-pay

## 2021-08-04 DIAGNOSIS — Z510 Encounter for antineoplastic radiation therapy: Secondary | ICD-10-CM | POA: Insufficient documentation

## 2021-08-04 DIAGNOSIS — Z171 Estrogen receptor negative status [ER-]: Secondary | ICD-10-CM | POA: Diagnosis not present

## 2021-08-04 DIAGNOSIS — C50512 Malignant neoplasm of lower-outer quadrant of left female breast: Secondary | ICD-10-CM | POA: Insufficient documentation

## 2021-08-04 DIAGNOSIS — C7949 Secondary malignant neoplasm of other parts of nervous system: Secondary | ICD-10-CM | POA: Diagnosis not present

## 2021-08-04 DIAGNOSIS — C7931 Secondary malignant neoplasm of brain: Secondary | ICD-10-CM | POA: Diagnosis not present

## 2021-08-05 ENCOUNTER — Encounter: Payer: Self-pay | Admitting: Nurse Practitioner

## 2021-08-05 ENCOUNTER — Other Ambulatory Visit (HOSPITAL_COMMUNITY): Payer: Self-pay

## 2021-08-05 ENCOUNTER — Ambulatory Visit: Payer: Medicaid Other | Attending: Nurse Practitioner | Admitting: Nurse Practitioner

## 2021-08-05 ENCOUNTER — Ambulatory Visit (HOSPITAL_COMMUNITY)
Admission: RE | Admit: 2021-08-05 | Discharge: 2021-08-05 | Disposition: A | Discharge status: Home / Self Care | Payer: Medicare Other | Source: Ambulatory Visit | Attending: Hematology and Oncology | Admitting: Hematology and Oncology

## 2021-08-05 DIAGNOSIS — Z5181 Encounter for therapeutic drug level monitoring: Secondary | ICD-10-CM | POA: Diagnosis not present

## 2021-08-05 DIAGNOSIS — Z0189 Encounter for other specified special examinations: Secondary | ICD-10-CM

## 2021-08-05 DIAGNOSIS — E119 Type 2 diabetes mellitus without complications: Secondary | ICD-10-CM

## 2021-08-05 DIAGNOSIS — Z79899 Other long term (current) drug therapy: Secondary | ICD-10-CM | POA: Insufficient documentation

## 2021-08-05 DIAGNOSIS — I700 Atherosclerosis of aorta: Secondary | ICD-10-CM | POA: Diagnosis not present

## 2021-08-05 LAB — ECHOCARDIOGRAM COMPLETE
Area-P 1/2: 4.39 cm2
Calc EF: 62.8 %
S' Lateral: 2.4 cm
Single Plane A2C EF: 66.7 %
Single Plane A4C EF: 58.3 %

## 2021-08-05 MED ORDER — ACCU-CHEK FASTCLIX LANCETS MISC: 3 refills | Status: AC

## 2021-08-05 NOTE — Progress Notes (Signed)
I spoke with the patient and her mother using the American sign language interpreter application and then her interpreter did come in person midway through our discussion.  We reviewed the final results of her MRI scan which unfortunately does show new disease.  Dr. Tammi Klippel has reviewed this with me as well and recommends that we treat her whole brain and extend her fields to cover the disease seen in the cervical level.  As she has had previous thoracic and lumbar disease and she is asymptomatic, he prefers holding off on additional coverage to these locations only if she became symptomatic and I explained to her symptoms to be on alert for and to notify us of.  She is not symptomatic in her cervical spine and no steroids will be planned however if she starts having symptoms she knows to call.  She will also continue to follow-up with Dr. [name].    Carola Rhine, PAC

## 2021-08-05 NOTE — Progress Notes (Signed)
Virtual Visit via Telephone Note Due to national recommendations of social distancing due to [place] 19, telehealth visit is felt to be most appropriate for this patient at this time.  I discussed the limitations, risks, security and privacy concerns of performing an evaluation and management service by telephone and the availability of in person appointments. I also discussed with the patient that there may be a patient responsible charge related to this service. The patient expressed understanding and agreed to proceed.    I connected with Erin [name] on 08/05/21  at   4:10 PM EST  EDT by telephone and verified that I am speaking with the correct person using two identifiers.  Location of Patient: Private Residence   Location of Provider: [place] and [employer] Office    Persons participating in Telemedicine visit: Geryl Rankins FNP-BC [place] INTERPRETER    History of Present Illness: Telemedicine visit for: DM 2  She has a past medical history of Arthritis, Borderline glaucoma of right eye, BRCA2 gene mutation positive (02/17/2018), Chronic back pain, Deaf, Depression, Hyperlipidemia, Hypertension, IDA, Malignant neoplasm of lower-outer quadrant of left breast of female, estrogen receptor negative 05-2017, DM2  Patient has been counseled on age-appropriate routine health concerns for screening and prevention. These are reviewed and up-to-date. Referrals have been placed accordingly. Immunizations are up-to-date or declined.    MAMMOGRAM: CT chest and abdomen pending. This is being ordered by Oncology. PAP: Hysterectomy COLONOSCOPY: 2019 repeat 2024  DM 2 She is no longer taking any diabetic medications. Stopped taking her metformin and reports it was making her sick.She has been taking OTC cinnamon daily instead as she was told this would help lower her glucose as well. States blood sugars are well controlled at home.  Lab Results  Component Value  Date   HGBA1C 5.7 04/04/2021     Past Medical History:  Diagnosis Date   Arthritis    knees, elbows   Borderline glaucoma of right eye    BRCA2 gene mutation positive 02/17/2018   BRCA2 c.4552del (p.Glu1518Asnfs*25)  Result reported out on 02/15/2018.    Breast cancer ([place]) 05/2017   left   Chronic back pain    Deaf    per pt born hearing and at age 76 1/2 lost hearing , was told by mother unknown cause, can miminally hear in left and no hearing on right    Depression    Dyspnea    states some SOB with ADLs and anemia    Elevated cancer antigen 125 (CA 125)    Family history of breast cancer    Family history of breast cancer    Family history of colon cancer    Genetic testing 02/17/2018   The Common Hereditary Cancer Panel offered by [facility] includes sequencing and/or deletion duplication testing of the following 47 genes: APC, ATM, AXIN2, BARD1, BMPR1A, BRCA1, BRCA2, BRIP1, CDH1, CDKN2A (p14ARF), CDKN2A (p16INK4a), CKD4, CHEK2, CTNNA1, DICER1, EPCAM (Deletion/duplication testing only), GREM1 (promoter region deletion/duplication testing only), KIT, MEN1, MLH1, MSH2, MSH3, MSH6, MU   History of cancer chemotherapy    left breast cancer 09-08-2017 to 10-09-2017;  fallopion tube cancer  12-15-2017  to 06-28-2018   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 oncologist-  dr [name]   11-04-2017  s/p  LAVH w/ BSO,  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;  completed chemo 06-28-2018   History of external beam radiation therapy    left breast  11-25-2017  to 01-07-2018   Hyperlipidemia    Hypertension    followed by pcp   (10-05-2019  per pt never had stress test)   IDA (iron deficiency anemia)    Malignant neoplasm of lower-outer quadrant of left breast of female, estrogen receptor negative [facility]) oncologist-- dr [name]   dx 08/ 2018--- Stage IA, DCIS,  ER/ PR negative,  HER-2 positive;  06-09-2017 s/p left breast lumpectomy with node dissection;   completed chemo 10-09-2017  and radiation 01-07-2018/  hercepton completed 06-28-2018   Non-insulin dependent type 2 diabetes mellitus ([place])    followed by pcp   (10-05-2019 per pt check cbg every other day in AM,  fasting cbg-- 105)   Numbness of right thumb    Wears glasses     Past Surgical History:  Procedure Laterality Date   BREAST LUMPECTOMY WITH RADIOACTIVE SEED AND SENTINEL LYMPH NODE BIOPSY Left 06/09/2017   Procedure: BREAST LUMPECTOMY WITH RADIOACTIVE SEED AND SENTINEL LYMPH NODE BIOPSY;  Surgeon: Excell Seltzer, MD;  Location: [facility];  Service: General;  Laterality: Left;   COLONOSCOPY  11/02/2017   polyps   HERNIA REPAIR     HIATAL HERNIA REPAIR  09/2019   IR RADIOLOGIST EVAL & MGMT  07/09/2021   IR RADIOLOGIST EVAL & MGMT  07/24/2021   LAPAROSCOPIC ASSISTED VAGINAL HYSTERECTOMY N/A 11/04/2017   Procedure: LAPAROSCOPIC ASSISTED VAGINAL HYSTERECTOMY;  Surgeon: [name], MD;  Location: [facility];  Service: Gynecology;  Laterality: N/A;   LAPAROSCOPIC BILATERAL SALPINGO OOPHERECTOMY Bilateral 11/04/2017   Procedure: LAPAROSCOPIC BILATERAL SALPINGO OOPHORECTOMY;  Surgeon: [name], MD;  Location: [facility];  Service: Gynecology;  Laterality: Bilateral;   LAPAROSCOPY N/A 10/09/2019   Procedure: LAPAROSCOPY DIAGNOSTIC WITH PERITONEAL BIOPSIES;  Surgeon: [name], MD;  Location: [facility];  Service: Gynecology;  Laterality: N/A;   PORTACATH PLACEMENT Right 06/09/2017   Procedure: INSERTION PORT-A-CATH WITH [place];  Surgeon: Excell Seltzer, MD;  Location: [facility];  Service: General;  Laterality: Right;   PORTACATH PLACEMENT Right 11/13/2019   Procedure: INSERTION PORT-A-CATH WITH ULTRASOUND GUIDANCE;  Surgeon: Rolm Bookbinder, MD;  Location: [facility];  Service: General;  Laterality: Right;   TUBAL LIGATION  02/02/2002   _0    PPTL   UPPER GI ENDOSCOPY      Family History  Problem Relation Age of Onset    Hypertension Mother    Lupus Mother    Diabetes Father    Hypertension Sister    Diabetes Paternal Grandmother    Colon cancer Paternal Grandmother 70   CAD Brother    Diabetes Brother    Heart attack Brother    Breast cancer Maternal Aunt        dx >50   Heart attack Paternal Grandfather    Breast cancer Maternal Aunt        dx under 20   Breast cancer Maternal Aunt        dx  under 61    Social History   Socioeconomic History   Marital status: Legally Separated    Spouse name: Not on file   Number of children: 2   Years of education: Not on file   Highest education level: Not on file  Occupational History   Not on file  Tobacco Use   Smoking status: Never   Smokeless tobacco: Never  Vaping Use   Vaping Use: Never used  Substance and Sexual Activity   Alcohol use: No  Drug use: Never   Sexual activity: Not on file  Other Topics Concern   Not on file  Social History Narrative   1 boy and 1 girl   Resides in [place]   Seperated   Social Determinants of Health   Financial Resource Strain: Not on file  Food Insecurity: Not on file  Transportation Needs: Not on file  Physical Activity: Not on file  Stress: Not on file  Social Connections: Not on file     Observations/Objective: Awake, alert and oriented x 3   ROS  Assessment and Plan: Diagnoses and all orders for this visit:  Type 2 diabetes mellitus without complication, without long-term current use of insulin ([place]) -     Accu-Chek FastClix Lancets MISC; Use as instructed. Inject into the skin twice daily    Follow Up Instructions Return in about 7 weeks (around 09/23/2021) for DM.     I discussed the assessment and treatment plan with the patient. The patient was provided an opportunity to ask questions and all were answered. The patient agreed with the plan and demonstrated an understanding of the instructions.   The patient was advised to call back or seek an in-person evaluation if the  symptoms worsen or if the condition fails to improve as anticipated.  I provided 10 minutes of non-face-to-face time during this encounter including median intraservice time, reviewing previous notes, labs, imaging, medications and explaining diagnosis and management.  [name], FNP-BC

## 2021-08-05 NOTE — Progress Notes (Signed)
  Echocardiogram 2D Echocardiogram has been performed.  Bobbye Charleston 08/05/2021, 11:16 AM

## 2021-08-06 NOTE — Progress Notes (Signed)
Pharmacist Chemotherapy Monitoring - Initial Assessment    Anticipated start date: 08/14/21   The following has been reviewed per standard work regarding the patient's treatment regimen: The patient's diagnosis, treatment plan and drug doses, and organ/hematologic function Lab orders and baseline tests specific to treatment regimen  The treatment plan start date, drug sequencing, and pre-medications Prior authorization status  Patient's documented medication list, including drug-drug interaction screen and prescriptions for anti-emetics and supportive care specific to the treatment regimen The drug concentrations, fluid compatibility, administration routes, and timing of the medications to be used The patient's access for treatment and lifetime cumulative dose history, if applicable  The patient's medication allergies and previous infusion related reactions, if applicable   Changes made to treatment plan:  N/A  Follow up needed:  N/A   Larene Beach, RPH, 08/06/2021  9:59 AM

## 2021-08-09 DIAGNOSIS — Z510 Encounter for antineoplastic radiation therapy: Secondary | ICD-10-CM | POA: Diagnosis not present

## 2021-08-09 DIAGNOSIS — Z171 Estrogen receptor negative status [ER-]: Secondary | ICD-10-CM | POA: Diagnosis not present

## 2021-08-09 DIAGNOSIS — C7931 Secondary malignant neoplasm of brain: Secondary | ICD-10-CM | POA: Diagnosis not present

## 2021-08-09 DIAGNOSIS — C7949 Secondary malignant neoplasm of other parts of nervous system: Secondary | ICD-10-CM | POA: Diagnosis not present

## 2021-08-09 DIAGNOSIS — C50512 Malignant neoplasm of lower-outer quadrant of left female breast: Secondary | ICD-10-CM | POA: Diagnosis not present

## 2021-08-11 ENCOUNTER — Other Ambulatory Visit (HOSPITAL_COMMUNITY): Payer: Self-pay | Admitting: Interventional Radiology

## 2021-08-11 DIAGNOSIS — C50919 Malignant neoplasm of unspecified site of unspecified female breast: Secondary | ICD-10-CM

## 2021-08-11 NOTE — Progress Notes (Signed)
Orders requested via Epic.

## 2021-08-12 ENCOUNTER — Other Ambulatory Visit: Payer: Self-pay

## 2021-08-12 ENCOUNTER — Other Ambulatory Visit (HOSPITAL_COMMUNITY): Payer: Self-pay

## 2021-08-12 ENCOUNTER — Ambulatory Visit
Admission: RE | Admit: 2021-08-12 | Discharge: 2021-08-12 | Disposition: A | Discharge status: Home / Self Care | Payer: Medicaid Other | Source: Ambulatory Visit | Attending: Radiation Oncology | Admitting: Radiation Oncology

## 2021-08-12 DIAGNOSIS — C7931 Secondary malignant neoplasm of brain: Secondary | ICD-10-CM | POA: Diagnosis not present

## 2021-08-12 DIAGNOSIS — C7949 Secondary malignant neoplasm of other parts of nervous system: Secondary | ICD-10-CM | POA: Diagnosis not present

## 2021-08-12 DIAGNOSIS — C50512 Malignant neoplasm of lower-outer quadrant of left female breast: Secondary | ICD-10-CM | POA: Diagnosis not present

## 2021-08-12 DIAGNOSIS — Z171 Estrogen receptor negative status [ER-]: Secondary | ICD-10-CM | POA: Diagnosis not present

## 2021-08-12 DIAGNOSIS — Z510 Encounter for antineoplastic radiation therapy: Secondary | ICD-10-CM | POA: Diagnosis not present

## 2021-08-12 MED ORDER — CAPECITABINE 500 MG PO TABS
1000.0000 mg | ORAL_TABLET | Freq: Two times a day (BID) | ORAL | 3 refills | Status: DC
  Filled 2021-08-12 (×2): qty 56, 14d supply, fill #0
  Filled 2021-09-04: qty 56, 14d supply, fill #1

## 2021-08-12 NOTE — Telephone Encounter (Signed)
Oral Chemotherapy Pharmacist Encounter  I spoke with patient for overview of new medication regimen consisting of Tukysa (tucatinib) and Xeloda (capecitabine) for the treatment of metastatic, HER-2 receptor positive breast cancer, in conjunction with infusional trastuzumab, planned duration until disease progression or unacceptable toxicity.  Counseled patient on administration, dosing, side effects, monitoring, drug-food interactions, safe handling, storage, and disposal.  Patient will take Xeloda 555m tablets, 2 tablets (1,0065m by mouth in AM and 2 tabs (1,00068mby mouth in PM, within 30 minutes of finishing meals, on days 1-14 of each 21 day cycle.   Patient will take Tukysa 150m6mblets, 2 tablets (300mg32m mouth twice daily, without regard to food, taken continuously.  Trastuzumab will be administered at standard dosing IV every 3 weeks.   Xeloda and Tukysa start date: 08/15/2021  Adverse effects of Xeloda include but are not limited to: fatigue, decreased blood counts, GI upset, diarrhea, mouth sores, and hand-foot syndrome.  Adverse effects of Tukysa include but are not limited to: palmar-plantar erythrodysesthesia, increased liver enzymes, hepatotoxicity, nausea, vomiting, mouth sores, decreased appetite, diarrhea, and electrolyte abnormalities.  Patient has anti-emetic on hand and knows to take it if nausea develops.   Patient will obtain anti diarrheal and alert the office of 4 or more loose stools above baseline.   Reviewed with patient importance of keeping a medication schedule and plan for any missed doses. No barriers to medication adherence identified.  Medication reconciliation performed and medication/allergy list updated.  InsurBiomedical engineerXeloda and TukysLaury Axon been obtained. Test claims at the pharmacy revealed copayments $0 for 1st fills of Xeloda and TukysTanzaniascriptions [place] [facility] and  delivered to patient's home on 08/13/2021.  Patient informed the pharmacy will reach out 5-7 days prior to needing next fills of Xeloda and Tukysa to coordinate continued medication acquisition to prevent break in therapy.  All questions answered.  Erin [name] voiced understanding and appreciation.   Medication education handout placed in mail for patient. Patient knows to call the office with questions or concerns. Oral Chemotherapy Clinic phone number provided to patient.   KaitlDrema HalonrmD Hematology/Oncology Clinical Pharmacist [id number]/2022  10:15 AM

## 2021-08-13 ENCOUNTER — Ambulatory Visit
Admission: RE | Admit: 2021-08-13 | Discharge: 2021-08-13 | Disposition: A | Discharge status: Home / Self Care | Payer: Medicaid Other | Source: Ambulatory Visit | Attending: Radiation Oncology | Admitting: Radiation Oncology

## 2021-08-13 ENCOUNTER — Other Ambulatory Visit (HOSPITAL_COMMUNITY): Payer: Self-pay

## 2021-08-13 DIAGNOSIS — C50512 Malignant neoplasm of lower-outer quadrant of left female breast: Secondary | ICD-10-CM | POA: Diagnosis not present

## 2021-08-13 DIAGNOSIS — Z510 Encounter for antineoplastic radiation therapy: Secondary | ICD-10-CM | POA: Diagnosis not present

## 2021-08-13 DIAGNOSIS — C7949 Secondary malignant neoplasm of other parts of nervous system: Secondary | ICD-10-CM | POA: Diagnosis not present

## 2021-08-13 DIAGNOSIS — Z171 Estrogen receptor negative status [ER-]: Secondary | ICD-10-CM | POA: Diagnosis not present

## 2021-08-13 NOTE — Progress Notes (Signed)
Patient Care Team: [name], NP as PCP - General (Nurse Practitioner) Excell Seltzer, MD (Inactive) as Consulting Physician (General Surgery) [name], MD as Consulting Physician (Hematology and Oncology) Kyung Rudd, MD as Consulting Physician (Radiation Oncology) [name], MD as Consulting Physician (Gynecologic Oncology) Delice Bison, Charlestine Massed, NP as Nurse Practitioner (Hematology and Oncology) Mauro Kaufmann, RN as Oncology Nurse Navigator Rockwell Germany, RN as Oncology Nurse Navigator  DIAGNOSIS:    ICD-10-CM   1. Malignant neoplasm of lower-outer quadrant of left breast of female, estrogen receptor negative ([place])  C50.512    Z17.1       SUMMARY OF ONCOLOGIC HISTORY: Oncology History  Malignant neoplasm of lower-outer quadrant of left breast of female, estrogen receptor negative ([place])  05/11/2017 Initial Diagnosis   Left breast biopsy 3:30 position 6 cm from nipple: IDC with DCIS, lymphovascular invasion present, grade 2-3, ER 0%, PR 0%, HER-2 positive ratio 2.28, Ki-67 20%, 1 cm lesion left breast, T1b N0 stage IA clinical stage   06/09/2017 Surgery   Left lumpectomy: IDC grade 3, 1.2 cm, DCIS is present, margins negative, 0/6 lymph nodes negative, ER 0%, PR 0%, HER-2 positive ratio 2.28, Ki-67 20%, T1 BN 0 stage IA   07/09/2017 - 09/29/2017 Adjuvant Chemotherapy   Taxol/Herceptin x 12 completed on 09/29/2017, went on to receive every 3 week adjuvant Herceptin through 06/28/2018   11/25/2017 - 01/07/2018 Radiation Therapy    The patient initially received a dose of 50.4 Gy in 28 fractions to the breast using whole-breast tangent fields. This was delivered using a 3-D conformal technique. The patient then received a boost to the seroma. This delivered an additional 10 Gy in 5 fractions using a 3-D technique. The total dose was 60.4 Gy.   02/15/2018 Genetic Testing   The Common Hereditary Cancer Panel offered by [facility] includes sequencing and/or deletion  duplication testing of the following 47 genes: APC, ATM, AXIN2, BARD1, BMPR1A, BRCA1, BRCA2, BRIP1, CDH1, CDKN2A (p14ARF), CDKN2A (p16INK4a), CKD4, CHEK2, CTNNA1, DICER1, EPCAM (Deletion/duplication testing only), GREM1 (promoter region deletion/duplication testing only), KIT, MEN1, MLH1, MSH2, MSH3, MSH6, MUTYH, NBN, NF1, NHTL1, PALB2, PDGFRA, PMS2, POLD1, POLE, PTEN, RAD50, RAD51C, RAD51D, SDHB, SDHC, SDHD, SMAD4, SMARCA4. STK11, TP53, TSC1, TSC2, and VHL.  The following genes were evaluated for sequence changes only: SDHA and HOXB13 c.251G>A variant only.   Results: POSITIVE. 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 The date of this test report is 02/15/2018   10/20/2019 PET scan   Bone mets involving Right hip, Bil Iliac bones, T8, T9, L2 and L3, Rt prox humerus, Righ glenoid. Some lytic and some sclerotic. Mild Left supraclav adenopathy, 2 small lesions basal ganglia (could be lacunar infarcts)   11/14/2019 - 12/26/2019 Chemotherapy   The patient had palonosetron (ALOXI) injection 0.25 mg, 0.25 mg, Intravenous,  Once, 2 of 5 cycles Administration: 0.25 mg (12/05/2019), 0.25 mg (12/26/2019) ado-trastuzumab emtansine (KADCYLA) 260 mg in sodium chloride 0.9 % 250 mL chemo infusion, 3.6 mg/kg = 260 mg, Intravenous, Once, 3 of 6 cycles Administration: 260 mg (11/14/2019), 260 mg (12/05/2019), 260 mg (12/26/2019)   for chemotherapy treatment.     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  Required TPN briefly.  Patient was able to swallow and keep food down by the time of discharge.   02/06/2020 Miscellaneous   Talazoparib   07/15/2021 - 07/15/2021 Chemotherapy  Patient is on Treatment Plan : Intrathecal Methotrexate     08/14/2021 -  Chemotherapy   Patient is on Treatment Plan : BREAST Capecitabine + Trastuzumab + Tucatinib q21d      Fallopian tube cancer, carcinoma, right ([place])  11/04/2017 Surgery   Uterus, cervix and bilateral fallopian tubes: Fallopian tube with serous carcinoma 0.6 cm positive for p53, PAX8, WT-1, MOC-31, cytokeratin 7, and estrogen receptor   01/11/2018 -  Chemotherapy   Taxol and carboplatin every 3 weeks (Taxol discontinued with cycle 2 because of profound rash)   10/20/2019 PET scan   Bone mets involving Right hip, Bil Iliac bones, T8, T9, L2 and L3, Rt prox humerus, Righ glenoid. Some lytic and some sclerotic. Mild Left supraclav adenopathy, 2 small lesions basal ganglia (could be lacunar infarcts)   11/14/2019 - 12/26/2019 Chemotherapy   The patient had palonosetron (ALOXI) injection 0.25 mg, 0.25 mg, Intravenous,  Once, 2 of 5 cycles Administration: 0.25 mg (12/05/2019), 0.25 mg (12/26/2019) ado-trastuzumab emtansine (KADCYLA) 260 mg in sodium chloride 0.9 % 250 mL chemo infusion, 3.6 mg/kg = 260 mg, Intravenous, Once, 3 of 6 cycles Administration: 260 mg (11/14/2019), 260 mg (12/05/2019), 260 mg (12/26/2019)   for chemotherapy treatment.     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 with above-mentioned history of metastatic breast cancer who is currently on Xgeva and chemotherapy with Herceptin. She presents to the clinic today for follow-up and treatment.  She is complaining of headaches after radiation is complete.  ALLERGIES:  has No Known Allergies.  MEDICATIONS:  Current Outpatient Medications  Medication Sig Dispense Refill   Accu-Chek  FastClix Lancets MISC Use as instructed. Inject into the skin twice daily 100 each 3   atorvastatin (LIPITOR) 40 MG tablet Take 1 tablet (40 mg total) by mouth daily. 90 tablet 1   Blood Glucose Calibration (ACCU-CHEK GUIDE CONTROL) LIQD 1 each by In Vitro route once as needed for up to 1 dose. 1 each 0   capecitabine (XELODA) 500 MG tablet Take 2 tablets (1,000 mg total) by mouth 2 (two) times daily after a meal. Take for 14 days on and 7 days off, repeat every 21 days. 56 tablet 3   cholecalciferol (VITAMIN D) 1000 units tablet Take 1,000 Units by mouth daily.     glucose blood (ACCU-CHEK GUIDE) test strip Use as instructed. Check blood glucose by fingerstick twice per day. 100 each 12   lidocaine-prilocaine (EMLA) cream Apply 1 application topically as needed. 30 g 5   lidocaine-prilocaine (EMLA) cream Apply to affected area once 30 g 3   memantine (NAMENDA) 10 MG tablet Take 1 tablet (10 mg total) by mouth 2 (two) times daily. 60 tablet 4   memantine (NAMENDA) 5 MG tablet Begin this prescription the first day of brain radiation. Week 1: take one tablet po qam. Week 2: take one tablet qam and qpm. Week 3: take two tablets qam, and one tablet po q pm. Week 4: take two tablets qam and qpm. Fill subsequent  prescription q month. 70 tablet 0   ondansetron (ZOFRAN ODT) 4 MG disintegrating tablet Take 1 tablet (4 mg total) by mouth every 8 (eight) hours as needed for nausea or vomiting. 30 tablet 0   ondansetron (ZOFRAN) 8 MG tablet Take 1 tablet (8 mg total) by mouth 2 (two) times daily as needed (Nausea or vomiting). 30 tablet 1   prochlorperazine (COMPAZINE) 10 MG tablet Take 1 tablet (10 mg total) by mouth every 6 (six) hours as needed (Nausea or vomiting). 30 tablet 1   tucatinib (TUKYSA) 150 MG tablet Take 2 tablets (300 mg total) by mouth 2 (two) times daily. Take every 12 hrs at the same time each day with or without a meal. 120 tablet 6   No current facility-administered medications for this  visit.   Facility-Administered Medications Ordered in Other Visits  Medication Dose Route Frequency Provider Last Rate Last Admin   heparin lock flush 100 unit/mL  500 Units Intracatheter Once PRN [name], MD       sodium chloride flush (NS) 0.9 % injection 10 mL  10 mL Intracatheter PRN [name], MD        PHYSICAL EXAMINATION: ECOG PERFORMANCE STATUS: 1 - Symptomatic but completely ambulatory  Vitals:   08/14/21 1214  BP: 126/82  Pulse: (!) 102  Resp: 18  Temp: 98.2 F (36.8 C)  SpO2: 100%   Filed Weights   08/14/21 1214  Weight: 139 lb 11.2 oz (63.4 kg)    LABORATORY DATA:  I have reviewed the data as listed CMP Latest Ref Rng & Units 07/14/2021 06/02/2021 04/04/2021  Glucose 70 - 99 mg/dL 113(H) 114(H) 95  BUN 6 - 20 mg/dL _0 Creatinine 0.44 - 1.00 mg/dL 0.70 0.67 0.57  Sodium 135 - 145 mmol/L 140 142 141  Potassium 3.5 - 5.1 mmol/L 3.8 3.8 3.8  Chloride 98 - 111 mmol/L 106 107 102  CO2 22 - 32 mmol/L _1 Calcium 8.9 - 10.3 mg/dL 9.4 9.0 9.3  Total Protein 6.5 - 8.1 g/dL 7.7 7.3 7.2  Total Bilirubin 0.3 - 1.2 mg/dL 0.4 0.4 0.3  Alkaline Phos 38 - 126 U/L 83 72 89  AST 15 - 41 U/L _2 ALT 0 - 44 U/L _3 Lab Results  Component Value Date   WBC 4.5 08/14/2021   HGB 10.1 (L) 08/14/2021   HCT 30.8 (L) 08/14/2021   MCV 104.4 (H) 08/14/2021   PLT 52 (L) 08/14/2021   NEUTROABS 3.1 08/14/2021    ASSESSMENT & PLAN:  Malignant neoplasm of lower-outer quadrant of left breast of female, estrogen receptor negative ([facility]) Left lumpectomy: IDC grade 3, 1.2 cm, DCIS is present, margins negative, 0/6 lymph nodes negative, ER 0%, PR 0%, HER-2 positive ratio 2.28, Ki-67 20%, T1 BN 0 stage IA S/P Taxol-Herceptin BRCA 2 Mutation Positive   10/20/19: Bone mets involving Right hip, Bil Iliac bones, T8, T9, L2 and L3, Rt prox humerus, Righ glenoid. Some lytic and some sclerotic. Mild Left supraclav adenopathy, 2 small lesions basal ganglia (could  be lacunar infarcts)   Biopsy the supra clav LN: 10/31/2019: Metastatic breast cancer, ER/PR negative HER-2 equivocal by IHC, FISH positive Treatment summary:  Kadcyla every 3-week treatment.  Discontinued for progression Talazoparib started 02/07/2020 discontinued November 2022 for progression   MRI of the back 01/07/2020: Extensive metastatic disease to the involved spine.  Tumor replacement of L3 vertebral body with pathologic compression fracture, epidural  and extraosseous extension.  Tumor extending throughout L3 foramen Status post palliative radiation therapy Causing severe dysphagia and dehydration and hospitalization 01/29/2020-02/03/2020 ------------------------------------------------------------------------------------------------------------------------------------------------------ Echocardiogram 11/14/2019 Current treatment: Tucatinib Herceptin and Xeloda   Monitoring closely for cytopenias. I instructed her to stop talazoparib and start of Xeloda and Tucatinib from 08/15/2021    CT CAP 09/20/19: No evidence of ST mets, tiny inferior right hepatic lesion substantially decreased (3 mm), bone mets have become sclerotic suggest healing CT CAP 12/26/2020: Stable diffuse bone mets CT CAP 07/01/2021: Stable bone metastases.  New liver lesion 1.5 cm Brain MRI 07/12/2021: Abnormal cerebellar signal suspicious for leptomeningeal spread. Lumbar puncture 07/15/2021: Highly suspicious for leptomeningeal carcinomatosis   Recommendation:  1. Microwave ablation of the liver met 2. palliative radiation to the brain: Brain tumor board did not recommend intrathecal chemo 3.  Since she was HER2 positive, switching treatment to Tucatinib plus Xeloda plus Herceptin based on HER-2 Climb study starting 08/14/2021   Return to clinic every 3 weeks for Herceptin.  She we will start Xeloda and Tucatinib orally from 08/15/2021 Follow-up in 1 week with Jenny Reichmann our pharmacy practitioner   No orders of the defined  types were placed in this encounter.  The patient has a good understanding of the overall plan. she agrees with it. she will call with any problems that may develop before the next visit here.  Total time spent: 30 mins including face to face time and time spent for planning, charting and coordination of care  Rulon Eisenmenger, MD, MPH 08/14/2021  I, Thana Ates, am acting as scribe for [name].  I have reviewed the above documentation for accuracy and completeness, and I agree with the above.

## 2021-08-14 ENCOUNTER — Inpatient Hospital Stay (HOSPITAL_BASED_OUTPATIENT_CLINIC_OR_DEPARTMENT_OTHER): Payer: Medicaid Other | Admitting: Hematology and Oncology

## 2021-08-14 ENCOUNTER — Encounter: Payer: Self-pay | Admitting: *Deleted

## 2021-08-14 ENCOUNTER — Other Ambulatory Visit: Payer: Self-pay

## 2021-08-14 ENCOUNTER — Ambulatory Visit
Admission: RE | Admit: 2021-08-14 | Discharge: 2021-08-14 | Disposition: A | Discharge status: Home / Self Care | Payer: Medicaid Other | Source: Ambulatory Visit | Attending: Radiation Oncology | Admitting: Radiation Oncology

## 2021-08-14 ENCOUNTER — Inpatient Hospital Stay: Payer: Medicaid Other | Attending: Gynecologic Oncology

## 2021-08-14 ENCOUNTER — Encounter: Payer: Self-pay | Admitting: Hematology and Oncology

## 2021-08-14 ENCOUNTER — Inpatient Hospital Stay: Payer: Medicaid Other

## 2021-08-14 DIAGNOSIS — R5383 Other fatigue: Secondary | ICD-10-CM | POA: Diagnosis not present

## 2021-08-14 DIAGNOSIS — Z171 Estrogen receptor negative status [ER-]: Secondary | ICD-10-CM

## 2021-08-14 DIAGNOSIS — R410 Disorientation, unspecified: Secondary | ICD-10-CM | POA: Insufficient documentation

## 2021-08-14 DIAGNOSIS — Z79899 Other long term (current) drug therapy: Secondary | ICD-10-CM | POA: Insufficient documentation

## 2021-08-14 DIAGNOSIS — Z510 Encounter for antineoplastic radiation therapy: Secondary | ICD-10-CM | POA: Insufficient documentation

## 2021-08-14 DIAGNOSIS — Z923 Personal history of irradiation: Secondary | ICD-10-CM | POA: Insufficient documentation

## 2021-08-14 DIAGNOSIS — Z7189 Other specified counseling: Secondary | ICD-10-CM

## 2021-08-14 DIAGNOSIS — C787 Secondary malignant neoplasm of liver and intrahepatic bile duct: Secondary | ICD-10-CM | POA: Diagnosis not present

## 2021-08-14 DIAGNOSIS — C50512 Malignant neoplasm of lower-outer quadrant of left female breast: Secondary | ICD-10-CM | POA: Insufficient documentation

## 2021-08-14 DIAGNOSIS — C7951 Secondary malignant neoplasm of bone: Secondary | ICD-10-CM | POA: Insufficient documentation

## 2021-08-14 DIAGNOSIS — C7949 Secondary malignant neoplasm of other parts of nervous system: Secondary | ICD-10-CM

## 2021-08-14 DIAGNOSIS — Z5112 Encounter for antineoplastic immunotherapy: Secondary | ICD-10-CM | POA: Diagnosis present

## 2021-08-14 DIAGNOSIS — D509 Iron deficiency anemia, unspecified: Secondary | ICD-10-CM | POA: Diagnosis not present

## 2021-08-14 DIAGNOSIS — Z95828 Presence of other vascular implants and grafts: Secondary | ICD-10-CM

## 2021-08-14 DIAGNOSIS — R112 Nausea with vomiting, unspecified: Secondary | ICD-10-CM | POA: Diagnosis not present

## 2021-08-14 LAB — CBC WITH DIFFERENTIAL (CANCER CENTER ONLY)
Abs Immature Granulocytes: 0.01 10*3/uL (ref 0.00–0.07)
Basophils Absolute: 0 10*3/uL (ref 0.0–0.1)
Basophils Relative: 0 %
Eosinophils Absolute: 0 10*3/uL (ref 0.0–0.5)
Eosinophils Relative: 1 %
HCT: 30.8 % — ABNORMAL LOW (ref 36.0–46.0)
Hemoglobin: 10.1 g/dL — ABNORMAL LOW (ref 12.0–15.0)
Immature Granulocytes: 0 %
Lymphocytes Relative: 21 %
Lymphs Abs: 1 10*3/uL (ref 0.7–4.0)
MCH: 34.2 pg — ABNORMAL HIGH (ref 26.0–34.0)
MCHC: 32.8 g/dL (ref 30.0–36.0)
MCV: 104.4 fL — ABNORMAL HIGH (ref 80.0–100.0)
Monocytes Absolute: 0.3 10*3/uL (ref 0.1–1.0)
Monocytes Relative: 8 %
Neutro Abs: 3.1 10*3/uL (ref 1.7–7.7)
Neutrophils Relative %: 70 %
Platelet Count: 52 10*3/uL — ABNORMAL LOW (ref 150–400)
RBC: 2.95 MIL/uL — ABNORMAL LOW (ref 3.87–5.11)
RDW: 14.8 % (ref 11.5–15.5)
WBC Count: 4.5 10*3/uL (ref 4.0–10.5)
nRBC: 0 % (ref 0.0–0.2)

## 2021-08-14 LAB — CMP (CANCER CENTER ONLY)
ALT: 20 U/L (ref 0–44)
AST: 22 U/L (ref 15–41)
Albumin: 3.9 g/dL (ref 3.5–5.0)
Alkaline Phosphatase: 76 U/L (ref 38–126)
Anion gap: 10 (ref 5–15)
BUN: 10 mg/dL (ref 6–20)
CO2: 25 mmol/L (ref 22–32)
Calcium: 9 mg/dL (ref 8.9–10.3)
Chloride: 106 mmol/L (ref 98–111)
Creatinine: 0.73 mg/dL (ref 0.44–1.00)
GFR, Estimated: >60 mL/min
Glucose, Bld: 101 mg/dL — ABNORMAL HIGH (ref 70–99)
Potassium: 3.9 mmol/L (ref 3.5–5.1)
Sodium: 141 mmol/L (ref 135–145)
Total Bilirubin: 0.5 mg/dL (ref 0.3–1.2)
Total Protein: 7.6 g/dL (ref 6.5–8.1)

## 2021-08-14 MED ORDER — HEPARIN SOD (PORK) LOCK FLUSH 100 UNIT/ML IV SOLN
500.0000 [IU] | Freq: Once | INTRAVENOUS | Status: AC | PRN
  Administered 2021-08-14: 500 [IU]

## 2021-08-14 MED ORDER — SODIUM CHLORIDE 0.9% FLUSH
10.0000 mL | INTRAVENOUS | Status: DC | PRN
  Administered 2021-08-14: 10 mL

## 2021-08-14 MED ORDER — DIPHENHYDRAMINE HCL 25 MG PO CAPS
50.0000 mg | ORAL_CAPSULE | Freq: Once | ORAL | Status: AC
  Administered 2021-08-14: 50 mg via ORAL
  Filled 2021-08-14: qty 2

## 2021-08-14 MED ORDER — SODIUM CHLORIDE 0.9 % IV SOLN: Freq: Once | INTRAVENOUS | Status: AC

## 2021-08-14 MED ORDER — TRASTUZUMAB-DKST CHEMO 150 MG IV SOLR
6.0000 mg/kg | Freq: Once | INTRAVENOUS | Status: AC
  Administered 2021-08-14: 399 mg via INTRAVENOUS
  Filled 2021-08-14: qty 19

## 2021-08-14 MED ORDER — ACETAMINOPHEN 325 MG PO TABS: 650.0000 mg | ORAL_TABLET | Freq: Once | ORAL | Status: DC

## 2021-08-14 NOTE — Patient Instructions (Addendum)
[facility]  Discharge Instructions: Thank you for choosing [facility] to provide your oncology and hematology care.   If you have a lab appointment with the [facility], please go directly to the [facility] and check in at the registration area.   Wear comfortable clothing and clothing appropriate for easy access to any Portacath or PICC line.   We strive to give you quality time with your provider. You may need to reschedule your appointment if you arrive late (15 or more minutes).  Arriving late affects you and other patients whose appointments are after yours.  Also, if you miss three or more appointments without notifying the office, you may be dismissed from the clinic at the provider's discretion.      For prescription refill requests, have your pharmacy contact our office and allow 72 hours for refills to be completed.    Today you received the following chemotherapy and/or immunotherapy agent: Trastuzumab ([place]ri)   To help prevent nausea and vomiting after your treatment, we encourage you to take your nausea medication as directed.  BELOW ARE SYMPTOMS THAT SHOULD BE REPORTED IMMEDIATELY: *FEVER GREATER THAN 100.4 F (38 C) OR HIGHER *CHILLS OR SWEATING *NAUSEA AND VOMITING THAT IS NOT CONTROLLED WITH YOUR NAUSEA MEDICATION *UNUSUAL SHORTNESS OF BREATH *UNUSUAL BRUISING OR BLEEDING *URINARY PROBLEMS (pain or burning when urinating, or frequent urination) *BOWEL PROBLEMS (unusual diarrhea, constipation, pain near the anus) TENDERNESS IN MOUTH AND THROAT WITH OR WITHOUT PRESENCE OF ULCERS (sore throat, sores in mouth, or a toothache) UNUSUAL RASH, SWELLING OR PAIN  UNUSUAL VAGINAL DISCHARGE OR ITCHING   Items with * indicate a potential emergency and should be followed up as soon as possible or go to the Emergency Department if any problems should occur.  Please show the CHEMOTHERAPY ALERT CARD or IMMUNOTHERAPY ALERT CARD at  check-in to the Emergency Department and triage nurse.  Should you have questions after your visit or need to cancel or reschedule your appointment, please contact [facility]  Dept: [phone]  and follow the prompts.  Office hours are 8:00 a.m. to 4:30 p.m. Monday - Friday. Please note that voicemails left after 4:00 p.m. may not be returned until the following business day.  We are closed weekends and major holidays. You have access to a nurse at all times for urgent questions. Please call the main number to the clinic Dept: [phone] and follow the prompts.   For any non-urgent questions, you may also contact your provider using MyChart. We now offer e-Visits for anyone 64 and older to request care online for non-urgent symptoms. For details visit [email or web address].   Also download the MyChart app! Go to the app store, search "MyChart", open the app, select [facility], and log in with your MyChart username and password.  Due to Covid, a mask is required upon entering the hospital/clinic. If you do not have a mask, one will be given to you upon arrival. For doctor visits, patients may have 1 support person aged 67 or older with them. For treatment visits, patients cannot have anyone with them due to current Covid guidelines and our immunocompromised population.   Trastuzumab injection for infusion What is this medication? TRASTUZUMAB (tras TOO zoo mab) is a monoclonal antibody. It is used to treat breast cancer and stomach cancer. This medicine may be used for other purposes; ask your health care provider or pharmacist if you have questions. COMMON BRAND NAME(S): Herceptin, Herzuma,  Ronnie Doss What should I tell my care team before I take this medication? They need to know if you have any of these conditions: heart disease heart failure lung or breathing disease, like asthma an unusual or allergic reaction to trastuzumab,  benzyl alcohol, or other medications, foods, dyes, or preservatives pregnant or trying to get pregnant breast-feeding How should I use this medication? This drug is given as an infusion into a vein. It is administered in a hospital or clinic by a specially trained health care professional. Talk to your pediatrician regarding the use of this medicine in children. This medicine is not approved for use in children. Overdosage: If you think you have taken too much of this medicine contact a poison control center or emergency room at once. NOTE: This medicine is only for you. Do not share this medicine with others. What if I miss a dose? It is important not to miss a dose. Call your doctor or health care professional if you are unable to keep an appointment. What may interact with this medication? This medicine may interact with the following medications: certain types of chemotherapy, such as daunorubicin, doxorubicin, epirubicin, and idarubicin This list may not describe all possible interactions. Give your health care provider a list of all the medicines, herbs, non-prescription drugs, or dietary supplements you use. Also tell them if you smoke, drink alcohol, or use illegal drugs. Some items may interact with your medicine. What should I watch for while using this medication? Visit your doctor for checks on your progress. Report any side effects. Continue your course of treatment even though you feel ill unless your doctor tells you to stop. Call your doctor or health care professional for advice if you get a fever, chills or sore throat, or other symptoms of a cold or flu. Do not treat yourself. Try to avoid being around people who are sick. You may experience fever, chills and shaking during your first infusion. These effects are usually mild and can be treated with other medicines. Report any side effects during the infusion to your health care professional. Fever and chills usually do not happen  with later infusions. Do not become pregnant while taking this medicine or for 7 months after stopping it. Women should inform their doctor if they wish to become pregnant or think they might be pregnant. Women of child-bearing potential will need to have a negative pregnancy test before starting this medicine. There is a potential for serious side effects to an unborn child. Talk to your health care professional or pharmacist for more information. Do not breast-feed an infant while taking this medicine or for 7 months after stopping it. Women must use effective birth control with this medicine. What side effects may I notice from receiving this medication? Side effects that you should report to your doctor or health care professional as soon as possible: allergic reactions like skin rash, itching or hives, swelling of the face, lips, or tongue chest pain or palpitations cough dizziness feeling faint or lightheaded, falls fever general ill feeling or flu-like symptoms signs of worsening heart failure like breathing problems; swelling in your legs and feet unusually weak or tired Side effects that usually do not require medical attention (report to your doctor or health care professional if they continue or are bothersome): bone pain changes in taste diarrhea joint pain nausea/vomiting weight loss This list may not describe all possible side effects. Call your doctor for medical advice about side effects. You may  report side effects to FDA at 1-800-FDA-1088. Where should I keep my medication? This drug is given in a hospital or clinic and will not be stored at home. NOTE: This sheet is a summary. It may not cover all possible information. If you have questions about this medicine, talk to your doctor, pharmacist, or health care provider.  2022 Elsevier/Gold Standard (2016-09-15 00:00:00)  Thrombocytopenia Thrombocytopenia means that you have a low number of platelets in your blood.  Platelets are tiny cells in the blood. When you bleed, they clump together at the cut or injury to stop the bleeding. This is called blood clotting. If you do not have enough platelets, your blood may have trouble clotting. This may cause you to bleed and bruise very easily. What are the causes? This condition is caused by a low number of platelets in your blood. There are three main reasons for this: Your body not making enough platelets. This may be caused by: Bone marrow diseases. Disorders that are passed from parent to child (inherited). Certain cancer medicines or treatments. Infection from germs (bacteria or viruses). Alcoholism. Platelets not being released in the blood. This can be caused by: Having a spleen that is larger than normal. A condition called Gaucher disease. Your body destroying platelets too quickly. This may be caused by: Certain autoimmune diseases. Some medicines that thin your blood. Certain blood clotting disorders. Certain bleeding disorders. Exposure to harmful (toxic) chemicals. Pregnancy. What are the signs or symptoms? Bruising easily. Bleeding from the nose or mouth. Heavy menstrual periods. Blood in the pee (urine), poop (stool), or vomit. A purple-red color to the skin (purpura). A rash that looks like pinpoint, purple-red spots (petechiae) on the lower legs. How is this treated? Treatment depends on the cause. Treatment may include: Treatment of another condition that is causing the low platelet count. Medicines to help protect your platelets from being destroyed. A replacement (transfusion) of platelets to stop or prevent bleeding. Surgery to take out the spleen. Follow these instructions at home: Medicines Take over-the-counter and prescription medicines only as told by your doctor. Do not take any medicines that have aspirin or NSAIDs, such as ibuprofen. Activity Avoid doing things that could hurt or bruise you. Take action to prevent  falls. Do not play contact sports. Ask your doctor what activities are safe for you. Take care not to burn yourself: When you use an iron. When you cook. Take care not to cut yourself: When you shave. When you use scissors, needles, knives, or other tools. General instructions  Check your skin and the inside of your mouth for bruises or blood as told by your doctor. Wear a medical alert bracelet that says that you have a bleeding disorder. Check to see if there is blood in your pee and poop. Do this as told by your doctor. Do not drink alcohol. If you do drink, limit the amount that you drink. Stay away from harmful (toxic) chemicals. Tell all of your doctors that you have this condition. Be sure to tell your dentist and eye doctor. Tell your dentist about your condition before you have your teeth cleaned. Keep all follow-up visits. Contact a doctor if: You have bruises and you do not know why. You have new symptoms. You have symptoms that get worse. You have a fever. Get help right away if: You have very bad bleeding anywhere on your body. You have blood in your vomit, pee, or poop. You have an injury to your head. You have  a sudden, very bad headache. Summary Thrombocytopenia means that you have a low number of platelets in your blood. Platelets stick together to form a clot. Symptoms of this condition include getting bruises easily, bleeding from the mouth and nose, a purple-red color to the skin, and a rash. Take care not to cut or burn yourself. This information is not intended to replace advice given to you by your health care provider. Make sure you discuss any questions you have with your health care provider. Document Revised: 02/13/2021 Document Reviewed: 02/13/2021 Elsevier Patient Education  [place].

## 2021-08-14 NOTE — Assessment & Plan Note (Signed)
Left lumpectomy: IDC grade 3, 1.2 cm, DCIS is present, margins negative, 0/6 lymph nodes negative, ER 0%, PR 0%, HER-2 positive ratio 2.28, Ki-67 20%, T1 BN 0 stage IA S/P Taxol-Herceptin BRCA 2 Mutation Positive  10/20/19:Bone mets involving Right hip, Bil Iliac bones, T8, T9, L2 and L3, Rt prox humerus, Righ glenoid. Some lytic and some sclerotic. Mild Left supraclav adenopathy, 2 small lesions basal ganglia (could be lacunar infarcts)  Biopsy the supra clav LN: 10/31/2019: Metastatic breast cancer, ER/PR negative HER-2 equivocal by IHC, FISHpositive Treatmentsummary: Kadcyla every 3-week treatment.Discontinued for progression  MRI of the back 01/07/2020: Extensive metastatic disease to the involved spine. Tumor replacement of L3 vertebral body with pathologic compression fracture, epidural and extraosseous extension. Tumor extending throughout L3 foramen Status post palliative radiation therapyCausing severe dysphagia and dehydration and hospitalization5/17/2021-02/03/2020 ------------------------------------------------------------------------------------------------------------------------------------------------------ Echocardiogram 11/14/2019 Current treatment:Talazoparib started 02/07/2020  Toxicities: Occasional mild nausea  Monitoring closely for cytopenias. Anemia:  Thrombocytopenia:   Currentdosage of talazoparib to0.5 mgstarted 09/20/2020 CT CAP 09/20/19: No evidence of ST mets, tiny inferior right hepatic lesion substantially decreased(3 mm), bone mets have become sclerotic suggest healing CT CAP 12/26/2020:Stable diffuse bone mets CT CAP 07/01/2021: Stable bone metastases. New liver lesion 1.5 cm Brain MRI 07/12/2021: Abnormal cerebellar signal suspicious for leptomeningeal spread. Lumbar puncture 07/15/2021: Highly suspicious for leptomeningeal carcinomatosis  Recommendation: 1. Microwave ablation of the liver met 2. palliative radiation to the brain: Brain  tumor board did not recommend intrathecal chemo 3.  Since she was HER2 positive, recommended switching treatment to Tucatinib plus Xeloda plus Herceptin based on HER-2 Climb study  Return to clinic every 3 weeks for Herceptin.  She received Xeloda and Tucatinib orally

## 2021-08-14 NOTE — Progress Notes (Signed)
Per MD okay to treat today with Plt 52 and HR 101.

## 2021-08-15 ENCOUNTER — Ambulatory Visit
Admission: RE | Admit: 2021-08-15 | Discharge: 2021-08-15 | Disposition: A | Discharge status: Home / Self Care | Payer: Medicaid Other | Source: Ambulatory Visit | Attending: Radiation Oncology | Admitting: Radiation Oncology

## 2021-08-15 DIAGNOSIS — Z5112 Encounter for antineoplastic immunotherapy: Secondary | ICD-10-CM | POA: Diagnosis not present

## 2021-08-18 ENCOUNTER — Other Ambulatory Visit: Payer: Self-pay

## 2021-08-18 ENCOUNTER — Ambulatory Visit
Admission: RE | Admit: 2021-08-18 | Discharge: 2021-08-18 | Disposition: A | Discharge status: Home / Self Care | Payer: Medicaid Other | Source: Ambulatory Visit | Attending: Radiation Oncology | Admitting: Radiation Oncology

## 2021-08-18 ENCOUNTER — Encounter: Payer: Self-pay | Admitting: Radiation Oncology

## 2021-08-18 DIAGNOSIS — Z5112 Encounter for antineoplastic immunotherapy: Secondary | ICD-10-CM | POA: Diagnosis not present

## 2021-08-18 DIAGNOSIS — H524 Presbyopia: Secondary | ICD-10-CM | POA: Diagnosis not present

## 2021-08-18 DIAGNOSIS — C50512 Malignant neoplasm of lower-outer quadrant of left female breast: Secondary | ICD-10-CM | POA: Diagnosis not present

## 2021-08-18 DIAGNOSIS — C7931 Secondary malignant neoplasm of brain: Secondary | ICD-10-CM | POA: Diagnosis not present

## 2021-08-19 ENCOUNTER — Ambulatory Visit
Admission: RE | Admit: 2021-08-19 | Discharge: 2021-08-19 | Disposition: A | Discharge status: Home / Self Care | Payer: Medicaid Other | Source: Ambulatory Visit | Attending: Radiation Oncology | Admitting: Radiation Oncology

## 2021-08-19 ENCOUNTER — Other Ambulatory Visit: Payer: Self-pay | Admitting: Radiology

## 2021-08-19 DIAGNOSIS — Z5112 Encounter for antineoplastic immunotherapy: Secondary | ICD-10-CM | POA: Diagnosis not present

## 2021-08-19 NOTE — Progress Notes (Addendum)
PCP - Geryl Rankins  Cardiologist - no   PPM/ICD -  Device Orders -  Rep Notified -   Chest x-ray -  EKG -  Stress Test -  ECHO - 08-05-21 epic Cardiac Cath -   Sleep Study -  CPAP -   Fasting Blood Sugar - < 200 Checks Blood Sugar __sometimes__  Blood Thinner Instructions: Aspirin Instructions:  ERAS Protcol - PRE-SURGERY Ensure or G2-   COVID TEST- 08-25-21 COVID vaccine -x4  Activity--Able to walk 3 floors at apartment  Able to complete 1 flight without SOB  Anesthesia review: malignant breast CA , DM  Patient denies shortness of breath, fever, cough and chest pain at PAT appointment   All instructions explained to the patient, with a verbal understanding of the material. Patient agrees to go over the instructions while at home for a better understanding. Patient also instructed to self quarantine after being tested for COVID-19. The opportunity to ask questions was provided.

## 2021-08-19 NOTE — Patient Instructions (Addendum)
DUE TO COVID-19 ONLY ONE VISITOR IS ALLOWED TO COME WITH YOU AND STAY IN THE WAITING ROOM ONLY DURING PRE OP AND PROCEDURE DAY OF SURGERY.   Up to two visitors ages 16+ are allowed at one time in a patient's room.  The visitors may rotate out with other people throughout the day.  Additionally, up to two children between the ages of 83 and 85 are allowed and do not count toward the number of allowed visitors.  Children within this age range must be accompanied by an adult visitor.  One adult visitor may remain with the patient overnight and must be in the room by 8 PM.  YOU NEED TO HAVE A COVID 19 TEST ON__12-12-22_____ between 8am-3pm______, THIS TEST MUST BE DONE BEFORE SURGERY,     Please bring completed form with you to the COVID testing site   COVID TESTING SITE [place] TEST IS COMPLETED,  PLEASE Wear a mask when in public           Your procedure is scheduled on: 08-27-21   Report to [facility] Main  Entrance   Report to admitting at     [phone] AM     Call this number if you have problems the morning of surgery [phone]   Remember: Do not eat food or drink liquids :After Midnight.   BRUSH YOUR TEETH MORNING OF SURGERY AND RINSE YOUR MOUTH OUT, NO CHEWING GUM CANDY OR MINTS.     Take these medicines the morning of surgery with A SIP OF WATER: Tucatinib(Tukysa),   memantine (namenda), Xeloda, lipitor  DO NOT TAKE ANY DIABETIC MEDICATIONS DAY OF YOUR SURGERY                               You may not have any metal on your body including hair pins and              piercings  Do not wear jewelry, make-up, lotions, powders,perfumes,    or    deodorant             Do not wear nail polish on your fingernails or toenails .  Do not shave  48 hours prior to surgery.            Do not bring valuables to the hospital. [place].  Contacts, dentures or bridgework  may not be worn into surgery.  You may bring a small overnight bag with you     Patients discharged the day of surgery will not be allowed to drive home. IF YOU ARE HAVING SURGERY AND GOING HOME THE SAME DAY, YOU MUST HAVE AN ADULT TO DRIVE YOU HOME AND BE WITH YOU FOR 24 HOURS. YOU MAY GO HOME BY TAXI OR UBER OR ORTHERWISE, BUT AN ADULT MUST ACCOMPANY YOU HOME AND STAY WITH YOU FOR 24 HOURS.  Name and phone number of your driver:  Special Instructions: N/A              Please read over the following fact sheets you were given: _____________________________________________________________________             [facility] - Preparing for Surgery Before surgery, you can play an important role.  Because skin is not  sterile, your skin needs to be as free of germs as possible.  You can reduce the number of germs on your skin by washing with CHG (chlorahexidine gluconate) soap before surgery.  CHG is an antiseptic cleaner which kills germs and bonds with the skin to continue killing germs even after washing. Please DO NOT use if you have an allergy to CHG or antibacterial soaps.  If your skin becomes reddened/irritated stop using the CHG and inform your nurse when you arrive at Short Stay. Do not shave (including legs and underarms) for at least 48 hours prior to the first CHG shower.  You may shave your face/neck. Please follow these instructions carefully:  1.  Shower with CHG Soap the night before surgery and the  morning of Surgery.  2.  If you choose to wash your hair, wash your hair first as usual with your  normal  shampoo.  3.  After you shampoo, rinse your hair and body thoroughly to remove the  shampoo.                           4.  Use CHG as you would any other liquid soap.  You can apply chg directly  to the skin and wash                       Gently with a scrungie or clean washcloth.  5.  Apply the CHG Soap to your body ONLY FROM THE NECK DOWN.   Do not use on face/ open                            Wound or open sores. Avoid contact with eyes, ears mouth and genitals (private parts).                       Wash face,  Genitals (private parts) with your normal soap.             6.  Wash thoroughly, paying special attention to the area where your surgery  will be performed.  7.  Thoroughly rinse your body with warm water from the neck down.  8.  DO NOT shower/wash with your normal soap after using and rinsing off  the CHG Soap.                9.  Pat yourself dry with a clean towel.            10.  Wear clean pajamas.            11.  Place clean sheets on your bed the night of your first shower and do not  sleep with pets. Day of Surgery : Do not apply any lotions/deodorants the morning of surgery.  Please wear clean clothes to the hospital/surgery center.  FAILURE TO FOLLOW THESE INSTRUCTIONS MAY RESULT IN THE CANCELLATION OF YOUR SURGERY PATIENT SIGNATURE_________________________________  NURSE SIGNATURE__________________________________  ________________________________________________________________________

## 2021-08-20 ENCOUNTER — Ambulatory Visit
Admission: RE | Admit: 2021-08-20 | Discharge: 2021-08-20 | Disposition: A | Discharge status: Home / Self Care | Payer: Medicaid Other | Source: Ambulatory Visit | Attending: Radiation Oncology | Admitting: Radiation Oncology

## 2021-08-20 ENCOUNTER — Inpatient Hospital Stay: Payer: Medicaid Other | Admitting: Pharmacist

## 2021-08-20 ENCOUNTER — Other Ambulatory Visit: Payer: Self-pay

## 2021-08-20 VITALS — BP 127/69 | HR 111 | Temp 98.1°F | Resp 17 | Ht 61.0 in | Wt 138.8 lb

## 2021-08-20 DIAGNOSIS — C50512 Malignant neoplasm of lower-outer quadrant of left female breast: Secondary | ICD-10-CM

## 2021-08-20 DIAGNOSIS — Z5112 Encounter for antineoplastic immunotherapy: Secondary | ICD-10-CM | POA: Diagnosis not present

## 2021-08-20 DIAGNOSIS — Z171 Estrogen receptor negative status [ER-]: Secondary | ICD-10-CM

## 2021-08-20 NOTE — Progress Notes (Signed)
Erin [name]       Telephone: [phone]?Fax: [phone]   Oncology Clinical Pharmacist Practitioner Initial Assessment  Erin [name] is a 60 y.o. female with a diagnosis of metastatic breast cancer.  Indication/Regimen Tucatinib Laury Axon)  and capecitabine (Xeloda) is being used appropriately for treatment of metastatic breast cancer by [name].    Wt Readings from Last 1 Encounters:  08/20/21 138 lb 12.8 oz (63 kg)    Estimated body surface area is 1.65 meters squared as calculated from the following:   Height as of this encounter: 5\' 1"  (1.549 m).   Weight as of this encounter: 138 lb 12.8 oz (63 kg).  The dosing regimen is capecitabine 1000 mg (606 mg/m2) by mouth every 12 hours on days 1 to 14 of a 21-day cycle and tucatinib 300 mg by mouth every 12 hours on days 1 to 14 of a 21-day cycle. Both medications were started on 08/15/21. Erin [name] also receives every 21 day trastuzumab which was started on 08/14/21. Her treatment regimen is planned to continue until disease progression or unacceptable toxicity. Erin [name] uses Sign Language to communicate and was accompanied by interpreter Erin [name] today and Erin [name], the patient's mother.   Erin [name] last saw Dr. [name] on 08/14/21 and she was here today to establish care with the clinical pharmacy clinic per Dr. Geralyn Flash request. Erin [name] is receiving her capecitabine and tucatinib from [facility] and has already spoken to our oral chemotherapy pharmacist on 07/30/21. Erin [name] is doing well.  She reports some constipation thus far and some balance issues which have persisted since her leptomeningeal disease was discovered.  She also has periodic headaches.  She is experiencing no other side effects at this time.    For her constipation, she is currently taking Senna-S which will be added to her medication list.  She is taking 2 tablets once daily.  Recommended adding Miralax once daily until her next bowel movement.   She believes she has some of this medication at home. She will also increase to Senna-S 2 tablets twice daily if no bowel movement in 24 hours after starting Miralax and contact the clinic if she has not had a bowel movement by Friday.  Her last known bowel movement was 08/15/21 and she states she is passing gas.  We also discussed eating foods high in fiber, drinking plenty of fluids, and to try to stay mobile as long as her unsteadiness will allow it. Exercise as tolerated. Discussed that diarrhea can be a side effect of the tucatinib/capecitabine combination and to stop all bowel stimulants/softeners if she starts to experience loose stools.    She continues with radiation treatments with last treatment tentatively 08/25/21.  She states she has liver ablation surgery planned for 08/27/21. While the myelosuppression risk with this treatment regimen is thought to be lower than with traditional chemotherapy regimens, recommended to stop tucatinib/capecitabine starting Sunday 08/24/21 out of an abundance of caution.  Recommended discussing with surgeons and Dr. [name] on when to restart after her surgery Wednesday. She was in agreement with this plan.    We also printed off a calendar for December reviewing when to take her capecitabine and tucatinib. Future calendars can be provided if needed by clinical pharmacy or oral chemotherapy pharmacists.  Contact information provided.  No labs were drawn today. She will see Erin Bihari NP on 09/03/21 with labs and second cycle of trastuzumab, then Dr. [name] with labs and trastuzumab 3  and 6 weeks later. She will see pharmacy clinic again on 11/05/20 with labs and trastuzumab.  Dose Modifications Capecitabine dose reduced as above (~606 mg/m2 every 12 hours 21/28)   Access Assessment DEVIN [name] will be receiving capecitabine and tucatinib through [facility] Concerns: None Start date if known: 08/15/21  Allergies No Known  Allergies  Vitals Vitals with BMI 08/20/2021 08/14/2021 07/30/2021  Height 5\' 1"  5\' 1"  5\' 1"   Weight 138 lbs 13 oz 139 lbs 11 oz 144 lbs 10 oz  BMI 26.24 85.88 50.27  Systolic [phone]  Diastolic 69 82 68  [name] 672 102 87     Laboratory Data CBC EXTENDED Latest Ref Rng & Units 08/14/2021 07/14/2021 06/02/2021  WBC 4.0 - 10.5 K/uL 4.5 6.2 5.4  RBC 3.87 - 5.11 MIL/uL 2.95(L) 3.12(L) 2.91(L)  HGB 12.0 - 15.0 g/dL 10.1(L) 10.8(L) 10.1(L)  HCT 36.0 - 46.0 % 30.8(L) 32.8(L) 30.8(L)  PLT 150 - 400 K/uL 52(L) 135(L) 53(L)  NEUTROABS 1.7 - 7.7 K/uL 3.1 4.5 4.0  LYMPHSABS 0.7 - 4.0 K/uL 1.0 1.0 1.1    CMP Latest Ref Rng & Units 08/14/2021 07/14/2021 06/02/2021  Glucose 70 - 99 mg/dL 101(H) 113(H) 114(H)  BUN 6 - 20 mg/dL 10 10 9   Creatinine 0.44 - 1.00 mg/dL 0.73 0.70 0.67  Sodium 135 - 145 mmol/L 141 140 142  Potassium 3.5 - 5.1 mmol/L 3.9 3.8 3.8  Chloride 98 - 111 mmol/L 106 106 107  CO2 22 - 32 mmol/L 25 27 25   Calcium 8.9 - 10.3 mg/dL 9.0 9.4 9.0  Total Protein 6.5 - 8.1 g/dL 7.6 7.7 7.3  Total Bilirubin 0.3 - 1.2 mg/dL 0.5 0.4 0.4  Alkaline Phos 38 - 126 U/L 76 83 72  AST 15 - 41 U/L 22 17 17   ALT 0 - 44 U/L 20 21 22    Lab Results  Component Value Date   MG 2.0 02/03/2020   MG 1.9 02/02/2020   MG 1.9 02/01/2020     Contraindications Contraindications were reviewed? Yes Contraindications to therapy were identified? No   Safety Precautions The following safety precautions for the use of capecitabine / tucatinib were reviewed:  Fever: reviewed the importance of having a thermometer and the Centers for Disease Control and Prevention (CDC) definition of fever which is 100.9F (38C) or higher. Patient should call 24/7 triage at [phone] if experiencing a fever or any other symptoms Diarrhea Hepatotoxicity Cardiotoxicity Dehydration and renal failure N/V Palmar-plantar erythrodysesthesia (Hand-foot syndrome) Mucositis / Stomatitis Alopecia Fatigue  Medication  Reconciliation Current Outpatient Medications  Medication Sig Dispense Refill   polyethylene glycol (MIRALAX / GLYCOLAX) 17 g packet Take 17 g by mouth daily as needed.     sennosides-docusate sodium (SENOKOT-S) 8.6-50 MG tablet Take 2 tablets by mouth daily as needed for constipation.     Accu-Chek FastClix Lancets MISC Use as instructed. Inject into the skin twice daily 100 each 3   atorvastatin (LIPITOR) 40 MG tablet Take 1 tablet (40 mg total) by mouth daily. 90 tablet 1   Blood Glucose Calibration (ACCU-CHEK GUIDE CONTROL) LIQD 1 each by In Vitro route once as needed for up to 1 dose. 1 each 0   capecitabine (XELODA) 500 MG tablet Take 2 tablets (1,000 mg total) by mouth 2 (two) times daily after a meal. Take for 14 days on and 7 days off, repeat every 21 days. 56 tablet 3   cholecalciferol (VITAMIN D) 1000 units tablet Take  1,000 Units by mouth daily.     glucose blood (ACCU-CHEK GUIDE) test strip Use as instructed. Check blood glucose by fingerstick twice per day. 100 each 12   lidocaine-prilocaine (EMLA) cream Apply 1 application topically as needed. 30 g 5   lidocaine-prilocaine (EMLA) cream Apply to affected area once 30 g 3   loperamide (IMODIUM) 2 MG capsule Take 2 mg by mouth as needed for diarrhea or loose stools. (Patient not taking: Reported on 08/20/2021)     memantine (NAMENDA) 10 MG tablet Take 1 tablet (10 mg total) by mouth 2 (two) times daily. 60 tablet 4   memantine (NAMENDA) 5 MG tablet Begin this prescription the first day of brain radiation. Week 1: take one tablet po qam. Week 2: take one tablet qam and qpm. Week 3: take two tablets qam, and one tablet po q pm. Week 4: take two tablets qam and qpm. Fill subsequent prescription q month. 70 tablet 0   ondansetron (ZOFRAN ODT) 4 MG disintegrating tablet Take 1 tablet (4 mg total) by mouth every 8 (eight) hours as needed for nausea or vomiting. 30 tablet 0   ondansetron (ZOFRAN) 8 MG tablet Take 1 tablet (8 mg total) by mouth 2  (two) times daily as needed (Nausea or vomiting). 30 tablet 1   prochlorperazine (COMPAZINE) 10 MG tablet Take 1 tablet (10 mg total) by mouth every 6 (six) hours as needed (Nausea or vomiting). 30 tablet 1   tucatinib (TUKYSA) 150 MG tablet Take 2 tablets (300 mg total) by mouth 2 (two) times daily. Take every 12 hrs at the same time each day with or without a meal. 120 tablet 6   No current facility-administered medications for this visit.   Facility-Administered Medications Ordered in Other Visits  Medication Dose Route Frequency Provider Last Rate Last Admin   heparin lock flush 100 unit/mL  500 Units Intracatheter Once PRN [name], MD       sodium chloride flush (NS) 0.9 % injection 10 mL  10 mL Intracatheter PRN [name], MD        Medication reconciliation is based on the patient's most recent medication list in the electronic medical record (EMR) including herbal products and OTC medications.   The patient's medication list was reviewed today with the patient? Yes   Drug-drug interactions (DDIs) DDIs were evaluated? Yes DDIs identified? No   Drug-Food Interactions Drug-food interactions were evaluated? Yes Drug-food interactions identified? No   Follow-up Plan  Start polyethylene glycol 3350 (Miralax) daily, hold once bowel movement Increase Senna-S to 2 tablets every 12 hours as needed for constipation if no bowel movement in 24 hours after starting polyethylene glycol 3350. Increase fiber intake, water intake, examples given. Mobility and exercise as tolerated Ms. Lown will contact clinic on Friday if she has still not had a bowel.  Hold tucatinib/capecitabine starting on 08/24/21 with upcoming liver ablation scheduled for 08/27/21. Surgery or Dr. [name] can provide further instructions on when to restart tucatinib/capecitabine once recovered from surgery Labs / Erin [name] visit / trastuzumab on 09/03/21 Labs / Dr. [name] visit / trastuzumab on 09/24/21 and  10/15/21 Labs / pharmacy clinic visit / trastuzumab on 11/05/21  [name] participated in the discussion, expressed understanding, and voiced agreement with the above plan. All questions were answered to her satisfaction. The patient was advised to contact the clinic at [phone] with any questions or concerns prior to her return visit.   I spent 60 minutes assessing the patient.  Raina Mina, RPH-CPP, 08/20/2021 10:25 AM

## 2021-08-21 ENCOUNTER — Ambulatory Visit
Admission: RE | Admit: 2021-08-21 | Discharge: 2021-08-21 | Disposition: A | Discharge status: Home / Self Care | Payer: Medicaid Other | Source: Ambulatory Visit | Attending: Radiation Oncology | Admitting: Radiation Oncology

## 2021-08-21 ENCOUNTER — Other Ambulatory Visit (HOSPITAL_COMMUNITY): Payer: Self-pay

## 2021-08-21 ENCOUNTER — Other Ambulatory Visit: Payer: Self-pay | Admitting: Pharmacist

## 2021-08-21 ENCOUNTER — Other Ambulatory Visit: Payer: Self-pay

## 2021-08-21 DIAGNOSIS — Z5112 Encounter for antineoplastic immunotherapy: Secondary | ICD-10-CM | POA: Diagnosis not present

## 2021-08-22 ENCOUNTER — Encounter (HOSPITAL_COMMUNITY)
Admission: RE | Admit: 2021-08-22 | Discharge: 2021-08-22 | Disposition: A | Discharge status: Home / Self Care | Payer: Medicaid Other | Source: Ambulatory Visit | Attending: Interventional Radiology | Admitting: Interventional Radiology

## 2021-08-22 ENCOUNTER — Other Ambulatory Visit: Payer: Self-pay

## 2021-08-22 ENCOUNTER — Ambulatory Visit
Admission: RE | Admit: 2021-08-22 | Discharge: 2021-08-22 | Disposition: A | Discharge status: Home / Self Care | Payer: Medicaid Other | Source: Ambulatory Visit | Attending: Radiation Oncology | Admitting: Radiation Oncology

## 2021-08-22 ENCOUNTER — Encounter (HOSPITAL_COMMUNITY): Payer: Self-pay

## 2021-08-22 VITALS — BP 126/68 | HR 90 | Temp 98.4°F | Resp 16 | Ht 61.0 in | Wt 136.0 lb

## 2021-08-22 DIAGNOSIS — Z01818 Encounter for other preprocedural examination: Secondary | ICD-10-CM | POA: Insufficient documentation

## 2021-08-22 DIAGNOSIS — Z5112 Encounter for antineoplastic immunotherapy: Secondary | ICD-10-CM | POA: Diagnosis not present

## 2021-08-22 DIAGNOSIS — E1169 Type 2 diabetes mellitus with other specified complication: Secondary | ICD-10-CM | POA: Diagnosis not present

## 2021-08-22 HISTORY — DX: Gastro-esophageal reflux disease without esophagitis: K21.9

## 2021-08-22 LAB — COMPREHENSIVE METABOLIC PANEL
ALT: 58 U/L — ABNORMAL HIGH (ref 0–44)
AST: 36 U/L (ref 15–41)
Albumin: 4.3 g/dL (ref 3.5–5.0)
Alkaline Phosphatase: 71 U/L (ref 38–126)
Anion gap: 6 (ref 5–15)
BUN: 11 mg/dL (ref 6–20)
CO2: 27 mmol/L (ref 22–32)
Calcium: 9.2 mg/dL (ref 8.9–10.3)
Chloride: 106 mmol/L (ref 98–111)
Creatinine, Ser: 0.99 mg/dL (ref 0.44–1.00)
GFR, Estimated: >60 mL/min (ref >60)
Glucose, Bld: 111 mg/dL — ABNORMAL HIGH (ref 70–99)
Potassium: 4.1 mmol/L (ref 3.5–5.1)
Sodium: 139 mmol/L (ref 135–145)
Total Bilirubin: 0.8 mg/dL (ref 0.3–1.2)
Total Protein: 8.1 g/dL (ref 6.5–8.1)

## 2021-08-22 LAB — HEMOGLOBIN A1C
Hgb A1c MFr Bld: 6.5 % — ABNORMAL HIGH (ref 4.8–5.6)
Mean Plasma Glucose: 139.85 mg/dL

## 2021-08-22 LAB — CBC
HCT: 32.6 % — ABNORMAL LOW (ref 36.0–46.0)
Hemoglobin: 10.6 g/dL — ABNORMAL LOW (ref 12.0–15.0)
MCH: 35.2 pg — ABNORMAL HIGH (ref 26.0–34.0)
MCHC: 32.5 g/dL (ref 30.0–36.0)
MCV: 108.3 fL — ABNORMAL HIGH (ref 80.0–100.0)
Platelets: 112 10*3/uL — ABNORMAL LOW (ref 150–400)
RBC: 3.01 MIL/uL — ABNORMAL LOW (ref 3.87–5.11)
RDW: 14.7 % (ref 11.5–15.5)
WBC: 3.9 10*3/uL — ABNORMAL LOW (ref 4.0–10.5)
nRBC: 0 % (ref 0.0–0.2)

## 2021-08-22 LAB — PROTIME-INR
INR: 1 (ref 0.8–1.2)
Prothrombin Time: 13.3 seconds (ref 11.4–15.2)

## 2021-08-22 LAB — GLUCOSE, CAPILLARY: Glucose-Capillary: 115 mg/dL — ABNORMAL HIGH (ref 70–99)

## 2021-08-22 LAB — APTT: aPTT: 29 seconds (ref 24–36)

## 2021-08-24 ENCOUNTER — Other Ambulatory Visit: Payer: Self-pay | Admitting: Radiation Oncology

## 2021-08-25 ENCOUNTER — Inpatient Hospital Stay: Payer: Medicaid Other | Admitting: *Deleted

## 2021-08-25 ENCOUNTER — Other Ambulatory Visit: Payer: Self-pay | Admitting: Radiation Oncology

## 2021-08-25 ENCOUNTER — Encounter: Payer: Self-pay | Admitting: Hematology and Oncology

## 2021-08-25 ENCOUNTER — Other Ambulatory Visit: Payer: Self-pay

## 2021-08-25 ENCOUNTER — Other Ambulatory Visit (HOSPITAL_COMMUNITY): Payer: Self-pay | Admitting: Interventional Radiology

## 2021-08-25 ENCOUNTER — Encounter: Payer: Self-pay | Admitting: Radiation Oncology

## 2021-08-25 ENCOUNTER — Ambulatory Visit
Admission: RE | Admit: 2021-08-25 | Discharge: 2021-08-25 | Disposition: A | Discharge status: Home / Self Care | Payer: Medicaid Other | Source: Ambulatory Visit | Attending: Radiation Oncology | Admitting: Radiation Oncology

## 2021-08-25 DIAGNOSIS — Z5112 Encounter for antineoplastic immunotherapy: Secondary | ICD-10-CM | POA: Diagnosis not present

## 2021-08-25 DIAGNOSIS — Z171 Estrogen receptor negative status [ER-]: Secondary | ICD-10-CM

## 2021-08-25 DIAGNOSIS — C50512 Malignant neoplasm of lower-outer quadrant of left female breast: Secondary | ICD-10-CM

## 2021-08-25 DIAGNOSIS — C7949 Secondary malignant neoplasm of other parts of nervous system: Secondary | ICD-10-CM

## 2021-08-25 DIAGNOSIS — C7931 Secondary malignant neoplasm of brain: Secondary | ICD-10-CM | POA: Diagnosis not present

## 2021-08-25 NOTE — Progress Notes (Signed)
[place] Social Work  Clinical Social Work was referred by medical oncology to review and complete healthcare advance directives.  Clinical Social Worker met and Engineer, structural with patient in Advance Directives clinic.  The patient designated Erin [name] as their primary healthcare agent and Erin [name] as their secondary agent.  Patient also completed healthcare living will.    Clinical Social Worker notarized documents and made copies for patient/family. Clinical Social Worker will send documents to medical records to be scanned into patient's chart. Clinical Social Worker encouraged patient/family to contact with any additional questions or concerns.   Johnnye Lana, MSW, LCSW, OSW-C Clinical Social Worker [facility] [phone]

## 2021-08-26 ENCOUNTER — Other Ambulatory Visit: Payer: Self-pay | Admitting: Radiology

## 2021-08-26 ENCOUNTER — Other Ambulatory Visit (HOSPITAL_COMMUNITY): Payer: Self-pay

## 2021-08-26 LAB — SARS CORONAVIRUS 2 (TAT 6-24 HRS): SARS Coronavirus 2: NEGATIVE

## 2021-08-26 MED ORDER — ONDANSETRON HCL 4 MG/2ML IJ SOLN: 4.0000 mg | Freq: Once | INTRAMUSCULAR | Status: DC

## 2021-08-26 NOTE — H&P (Deleted)
  The note originally documented on this encounter has been moved the the encounter in which it belongs.  

## 2021-08-26 NOTE — H&P (Signed)
Chief Complaint: Patient was seen in consultation today for metastatic breast cancer  Referring Physician(s): [name], MD  Supervising Physician: Aletta Edouard  Patient Status: [facility] - Out-pt  History of Present Illness: Erin [name] is a 60 y.o. female with a past medical history significant for deafness, depression, HTN, HLD, anemia, GERD, DM and metastatic breast cancer who presents today for percutaneous thermal ablation of right lobe liver metastasis. Erin [name] was seen in consultation by Dr. [name] on 07/09/21 with follow on 07/24/21 - please see these notes for full details. Briefly, Erin [name] was diagnosed with left breast cancer in 2018 s/p several rounds of chemotherapy and radiation, she was then diagnosed with carcinoma of the right fallopian tube in 2019 for which she underwent chemotherapy and radiation. On restaging CT 07/01/21 she was found to have a new 1.5 cm lesion in the medial aspect of segment V of the right lobe of the liver with stable bone metastasis. She underwent MRI of the brain 07/10/21 which was suspicious for leptomeningeal spread of carcinoma, LP on 07/15/21 was highly suspicious for metastatic carcinoma. MRI of the abdomen 07/21/21 showed a solitary 1.5 x 1.3 cm lesion in the central right lobe between segments 6 and 7 without other metastatic lesions in the liver. She was referred to IR for possible microwave ablation of the liver metastasis for which she presents today.  Erin [name] denies any complaints, she feels well overall and understands the procedure today - including the use of general anesthesia. She is hopeful to go home this afternoon.   Past Medical History:  Diagnosis Date   Arthritis    knees, elbows   Borderline glaucoma of right eye    BRCA2 gene mutation positive 02/17/2018   BRCA2 c.4552del (p.Glu1518Asnfs*25)  Result reported out on 02/15/2018.    Breast cancer ([place]) 05/2017   left   Chronic back pain    Deaf    per pt born  hearing and at age 65 1/2 lost hearing , was told by mother unknown cause, can miminally hear in left and no hearing on right    Depression    Elevated cancer antigen 125 (CA 125)    Family history of breast cancer    Family history of breast cancer    Family history of colon cancer    Genetic testing 02/17/2018   The Common Hereditary Cancer Panel offered by [facility] includes sequencing and/or deletion duplication testing of the following 47 genes: APC, ATM, AXIN2, BARD1, BMPR1A, BRCA1, BRCA2, BRIP1, CDH1, CDKN2A (p14ARF), CDKN2A (p16INK4a), CKD4, CHEK2, CTNNA1, DICER1, EPCAM (Deletion/duplication testing only), GREM1 (promoter region deletion/duplication testing only), KIT, MEN1, MLH1, MSH2, MSH3, MSH6, MU   GERD (gastroesophageal reflux disease)    History of cancer chemotherapy    left breast cancer 09-08-2017 to 10-09-2017;  fallopion tube cancer  12-15-2017  to 06-28-2018   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 oncologist-  dr [name]   11-04-2017  s/p  LAVH w/ BSO,  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;  completed chemo 06-28-2018   History of external beam radiation therapy    left breast  11-25-2017  to 01-07-2018   Hyperlipidemia    Hypertension    followed by pcp   (10-05-2019  per pt never had stress test)   IDA (iron deficiency anemia)    Malignant neoplasm of lower-outer quadrant of left breast of female, estrogen receptor negative [facility]) oncologist-- dr [name]  dx 08/ 2018--- Stage IA, DCIS,  ER/ PR negative,  HER-2 positive;  06-09-2017 s/p left breast lumpectomy with node dissection;  completed chemo 10-09-2017  and radiation 01-07-2018/  hercepton completed 06-28-2018   Non-insulin dependent type 2 diabetes mellitus ([place])    followed by pcp   (10-05-2019 per pt check cbg every other day in AM,  fasting cbg-- 105)   Numbness of right thumb    Wears glasses     Past Surgical History:  Procedure Laterality Date   BREAST  LUMPECTOMY WITH RADIOACTIVE SEED AND SENTINEL LYMPH NODE BIOPSY Left 06/09/2017   Procedure: BREAST LUMPECTOMY WITH RADIOACTIVE SEED AND SENTINEL LYMPH NODE BIOPSY;  Surgeon: Excell Seltzer, MD;  Location: [facility];  Service: General;  Laterality: Left;   COLONOSCOPY  11/02/2017   polyps   HERNIA REPAIR     HIATAL HERNIA REPAIR  09/2019   IR RADIOLOGIST EVAL & MGMT  07/09/2021   IR RADIOLOGIST EVAL & MGMT  07/24/2021   LAPAROSCOPIC ASSISTED VAGINAL HYSTERECTOMY N/A 11/04/2017   Procedure: LAPAROSCOPIC ASSISTED VAGINAL HYSTERECTOMY;  Surgeon: [name], MD;  Location: [facility];  Service: Gynecology;  Laterality: N/A;   LAPAROSCOPIC BILATERAL SALPINGO OOPHERECTOMY Bilateral 11/04/2017   Procedure: LAPAROSCOPIC BILATERAL SALPINGO OOPHORECTOMY;  Surgeon: [name], MD;  Location: [facility];  Service: Gynecology;  Laterality: Bilateral;   LAPAROSCOPY N/A 10/09/2019   Procedure: LAPAROSCOPY DIAGNOSTIC WITH PERITONEAL BIOPSIES;  Surgeon: [name], MD;  Location: [facility];  Service: Gynecology;  Laterality: N/A;   PORTACATH PLACEMENT Right 06/09/2017   Procedure: INSERTION PORT-A-CATH WITH [place];  Surgeon: Excell Seltzer, MD;  Location: [facility];  Service: General;  Laterality: Right;   PORTACATH PLACEMENT Right 11/13/2019   Procedure: INSERTION PORT-A-CATH WITH ULTRASOUND GUIDANCE;  Surgeon: Rolm Bookbinder, MD;  Location: [facility];  Service: General;  Laterality: Right;   TUBAL LIGATION  02/02/2002   @[facility]    [facility]   UPPER GI ENDOSCOPY      Allergies: Patient has no known allergies.  Medications: Prior to Admission medications   Medication Sig Start Date End Date Taking? Authorizing Provider  Accu-Chek FastClix Lancets MISC Use as instructed. Inject into the skin twice daily 08/05/21   [name], NP  atorvastatin (LIPITOR) 40 MG tablet Take 1 tablet (40 mg total) by mouth daily. 04/04/21   Scot Jun, FNP   Blood Glucose Calibration (ACCU-CHEK GUIDE CONTROL) LIQD 1 each by In Vitro route once as needed for up to 1 dose. 12/26/18   [name], NP  capecitabine (XELODA) 500 MG tablet Take 2 tablets (1,000 mg total) by mouth 2 (two) times daily after a meal. Take for 14 days on and 7 days off, repeat every 21 days. 08/12/21   [name], MD  CINNAMON PO Take 2,400 mg by mouth every evening. 1200 mg per cap    [provider]  diclofenac Sodium (VOLTAREN) 1 % GEL Apply 1 application topically daily as needed (pain).    [provider]  glucose blood (ACCU-CHEK GUIDE) test strip Use as instructed. Check blood glucose by fingerstick twice per day. 11/18/20   [name], NP  lidocaine-prilocaine (EMLA) cream Apply 1 application topically as needed. Patient not taking: Reported on 08/21/2021 09/03/20   Harle Stanford., PA-C  lidocaine-prilocaine (EMLA) cream Apply to affected area once Patient taking differently: Apply 1 application topically daily as needed. Apply to affected area once 07/30/21   [name],  MD  loperamide (IMODIUM) 2 MG capsule Take 2 mg by mouth as needed for diarrhea or loose stools. Patient not taking: Reported on 08/20/2021    [name], MD  memantine (NAMENDA) 10 MG tablet Take 1 tablet (10 mg total) by mouth 2 (two) times daily. Patient not taking: Reported on 08/21/2021 07/30/21   Hayden Pedro, PA-C  memantine [facility]) 5 MG tablet Begin this prescription the first day of brain radiation. Week 1: take one tablet po qam. Week 2: take one tablet qam and qpm. Week 3: take two tablets qam, and one tablet po q pm. Week 4: take two tablets qam and qpm. Fill subsequent prescription q month. Patient taking differently: Take 5-10 mg by mouth See admin instructions. Begin this prescription the first day of brain radiation. Week 1: take one tablet po qam. Week 2: take one tablet qam and qpm. Week 3: take two tablets qam, and one tablet po q pm. Week 4:  take two tablets qam and qpm. Fill subsequent prescription q month. 07/30/21   Hayden Pedro, PA-C  Multiple Vitamins-Minerals (MULTIVITAMIN ADULT) CHEW Chew 2 each by mouth daily.    [provider]  ondansetron (ZOFRAN ODT) 4 MG disintegrating tablet Take 1 tablet (4 mg total) by mouth every 8 (eight) hours as needed for nausea or vomiting. Patient not taking: Reported on 08/21/2021 04/04/21   Scot Jun, FNP  ondansetron (ZOFRAN) 8 MG tablet Take 1 tablet (8 mg total) by mouth 2 (two) times daily as needed (Nausea or vomiting). 07/30/21   [name], MD  prochlorperazine (COMPAZINE) 10 MG tablet Take 1 tablet (10 mg total) by mouth every 6 (six) hours as needed (Nausea or vomiting). Patient not taking: Reported on 08/21/2021 07/30/21   [name], MD  tucatinib (TUKYSA) 150 MG tablet Take 2 tablets (300 mg total) by mouth 2 (two) times daily. Take every 12 hrs at the same time each day with or without a meal. 07/30/21   [name], MD     Family History  Problem Relation Age of Onset   Hypertension Mother    Lupus Mother    Diabetes Father    Hypertension Sister    Diabetes Paternal Grandmother    Colon cancer Paternal Grandmother 66   CAD Brother    Diabetes Brother    Heart attack Brother    Breast cancer Maternal Aunt        dx >50   Heart attack Paternal Grandfather    Breast cancer Maternal Aunt        dx under 50   Breast cancer Maternal Aunt        dx  under 74    Social History   Socioeconomic History   Marital status: Legally Separated    Spouse name: Not on file   Number of children: 2   Years of education: Not on file   Highest education level: Not on file  Occupational History   Not on file  Tobacco Use   Smoking status: Never   Smokeless tobacco: Never  Vaping Use   Vaping Use: Never used  Substance and Sexual Activity   Alcohol use: Not Currently    Comment: very rare   Drug use: Never   Sexual activity: Not Currently     Birth control/protection: Surgical  Other Topics Concern   Not on file  Social History Narrative   1 boy and 1 girl   Resides in [place]   Seperated  Social Determinants of Health   Financial Resource Strain: Not on file  Food Insecurity: Not on file  Transportation Needs: Not on file  Physical Activity: Not on file  Stress: Not on file  Social Connections: Not on file     Review of Systems: A 12 point ROS discussed and pertinent positives are indicated in the HPI above.  All other systems are negative.  Review of Systems  Constitutional:  Negative for chills and fever.  Respiratory:  Negative for cough and shortness of breath.   Cardiovascular:  Negative for chest pain.  Gastrointestinal:  Negative for abdominal pain, nausea and vomiting.  Musculoskeletal:  Negative for back pain.  Neurological:  Negative for headaches.   Vital Signs: LMP 07/09/2017 (LMP Unknown)   Physical Exam Vitals reviewed.  Constitutional:      General: She is not in acute distress.    Comments: Patient is deaf, sign language interpreter at bedside for exam  HENT:     Head: Normocephalic.     Mouth/Throat:     Mouth: Mucous membranes are moist.     Pharynx: Oropharynx is clear. No oropharyngeal exudate or posterior oropharyngeal erythema.  Eyes:     General: No scleral icterus. Cardiovascular:     Rate and Rhythm: Normal rate and regular rhythm.  Pulmonary:     Effort: Pulmonary effort is normal.     Breath sounds: Normal breath sounds.  Abdominal:     General: There is no distension.     Palpations: Abdomen is soft.     Tenderness: There is no abdominal tenderness.  Skin:    General: Skin is warm and dry.     Coloration: Skin is not jaundiced.  Neurological:     Mental Status: She is alert and oriented to person, place, and time.  Psychiatric:        Mood and Affect: Mood normal.        Behavior: Behavior normal.        Thought Content: Thought content normal.         Judgment: Judgment normal.     MD Evaluation Airway: WNL Heart: WNL Abdomen: WNL Chest/ Lungs: WNL ASA  Classification: Per MD or Designee Mallampati/Airway Score:  (per MD)   Imaging: ECHOCARDIOGRAM COMPLETE  Result Date: 08/05/2021    ECHOCARDIOGRAM REPORT   Patient Name:   Erin [name] Date of Exam: 08/05/2021 Medical Rec #:  [id number]      Height:       61.0 in Accession #:    [id number]     Weight:       144.6 lb Date of Birth:  11/01/1960     BSA:          1.646 m Patient Age:    18 years       BP:           118/68 mmHg Patient Gender: F              HR:           86 bpm. Exam Location:  Outpatient Procedure: 2D Echo, 3D Echo, Cardiac Doppler, Color Doppler and Strain Analysis Indications:    Z51.11 Encounter for antineoplastic chemotheraphy  History:        Patient has prior history of Echocardiogram examinations, most                 recent 11/14/2019. Risk Factors:Diabetes. Metastatic cancer.  Sonographer:    Roseanna Rainbow RDCS Referring Phys: [phone]  VINAY GUDENA  Sonographer Comments: Technically difficult study due to poor echo windows. Image acquisition challenging due to respiratory motion. Global longitudinal strain was attempted. IMPRESSIONS  1. Left ventricular ejection fraction, by estimation, is 60 to 65%. The left ventricle has normal function. The left ventricle has no regional wall motion abnormalities. Left ventricular diastolic parameters are consistent with Grade I diastolic dysfunction (impaired relaxation). The average left ventricular global longitudinal strain is -21.8 %. The global longitudinal strain is normal.  2. Right ventricular systolic function is normal. The right ventricular size is normal. There is normal pulmonary artery systolic pressure.  3. The mitral valve is normal in structure. No evidence of mitral valve regurgitation. No evidence of mitral stenosis.  4. The aortic valve is tricuspid. There is mild calcification of the aortic valve. Aortic valve  regurgitation is not visualized. No aortic stenosis is present.  5. The inferior vena cava is normal in size with greater than 50% respiratory variability, suggesting right atrial pressure of 3 mmHg. FINDINGS  Left Ventricle: Left ventricular ejection fraction, by estimation, is 60 to 65%. The left ventricle has normal function. The left ventricle has no regional wall motion abnormalities. The average left ventricular global longitudinal strain is -21.8 %. The global longitudinal strain is normal. The left ventricular internal cavity size was normal in size. There is no left ventricular hypertrophy. Left ventricular diastolic parameters are consistent with Grade I diastolic dysfunction (impaired relaxation). Right Ventricle: The right ventricular size is normal. No increase in right ventricular wall thickness. Right ventricular systolic function is normal. There is normal pulmonary artery systolic pressure. The tricuspid regurgitant velocity is 2.05 m/s, and  with an assumed right atrial pressure of 3 mmHg, the estimated right ventricular systolic pressure is 24.0 mmHg. Left Atrium: Left atrial size was normal in size. Right Atrium: Right atrial size was normal in size. Pericardium: There is no evidence of pericardial effusion. Mitral Valve: The mitral valve is normal in structure. Mild mitral annular calcification. No evidence of mitral valve regurgitation. No evidence of mitral valve stenosis. Tricuspid Valve: The tricuspid valve is normal in structure. Tricuspid valve regurgitation is trivial. No evidence of tricuspid stenosis. Aortic Valve: The aortic valve is tricuspid. There is mild calcification of the aortic valve. Aortic valve regurgitation is not visualized. No aortic stenosis is present. Pulmonic Valve: The pulmonic valve was normal in structure. Pulmonic valve regurgitation is not visualized. No evidence of pulmonic stenosis. Aorta: The aortic root is normal in size and structure. Venous: The inferior  vena cava is normal in size with greater than 50% respiratory variability, suggesting right atrial pressure of 3 mmHg. IAS/Shunts: No atrial level shunt detected by color flow Doppler.  LEFT VENTRICLE PLAX 2D LVIDd:         4.10 cm     Diastology LVIDs:         2.40 cm     LV e' medial:    8.49 cm/s LV PW:         0.90 cm     LV E/e' medial:  8.1 LV IVS:        0.90 cm     LV e' lateral:   9.68 cm/s LVOT diam:     1.90 cm     LV E/e' lateral: 7.1 LV SV:         48 LV SV Index:   29          2D Longitudinal Strain LVOT Area:     2.84  cm    2D Strain GLS Avg:     -21.8 %  LV Volumes (MOD) LV vol d, MOD A2C: 46.6 ml 3D Volume EF: LV vol d, MOD A4C: 37.2 ml 3D EF:        52 % LV vol s, MOD A2C: 15.5 ml LV EDV:       76 ml LV vol s, MOD A4C: 15.5 ml LV ESV:       36 ml LV SV MOD A2C:     31.1 ml LV SV:        40 ml LV SV MOD A4C:     37.2 ml LV SV MOD BP:      27.0 ml RIGHT VENTRICLE             IVC RV S prime:     78.80 cm/s  IVC diam: 0.90 cm TAPSE (M-mode): 2.5 cm LEFT ATRIUM             Index        RIGHT ATRIUM          Index LA diam:        3.20 cm 1.94 cm/m   RA Area:     9.41 cm LA Vol (A2C):   23.7 ml 14.40 ml/m  RA Volume:   16.40 ml 9.97 ml/m LA Vol (A4C):   11.6 ml 7.05 ml/m LA Biplane Vol: 16.2 ml 9.84 ml/m  AORTIC VALVE LVOT Vmax:   98.00 cm/s LVOT Vmean:  61.600 cm/s LVOT VTI:    0.169 m  AORTA Ao Root diam: 2.90 cm Ao Asc diam:  2.80 cm MITRAL VALVE               TRICUSPID VALVE MV Area (PHT): 4.39 cm    TR Peak grad:   16.8 mmHg MV Decel Time: 173 msec    TR Vmax:        205.00 cm/s MV E velocity: 68.90 cm/s MV A velocity: 77.10 cm/s  SHUNTS MV E/A ratio:  0.89        Systemic VTI:  0.17 m                            Systemic Diam: 1.90 cm Erin Bickers MD Electronically signed by Erin Bickers MD Signature Date/Time: 08/05/2021/12:48:21 PM    Final    MR TOTAL SPINE METS SCREENING  Result Date: 08/02/2021 CLINICAL DATA:  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 CONTRAST:  6.80m GADAVIST GADOBUTROL 1 MMOL/ML IV SOLN COMPARISON:  CT chest/abdomen/pelvis 07/01/2021, lumbar spine MRI 01/05/2020 FINDINGS: MRI CERVICAL SPINE FINDINGS Alignment: Normal. Vertebrae: There is no evidence of osseous metastatic disease in the cervical spine. Vertebral body heights are preserved. 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 There is a punctate focus of enhancement along the left surface of the cord at the craniocervical junction (36-2). Posterior Fossa, vertebral arteries, paraspinal tissues: There is a small focus of abnormal enhancement along the left inferior cerebellar folia corresponding to a finding on the prior brain MRI and likely reflecting leptomeningeal disease (36-1). The posterior fossa is otherwise not well assessed. The  paraspinal soft tissues are unremarkable. The vertebral artery flow voids are present. Disc levels: C2-C3: No significant spinal canal or neural foraminal stenosis. C3-C4: There is a prominent central disc protrusion and uncovertebral and bilateral facet arthropathy resulting in mild spinal canal stenosis and mild bilateral neural foraminal stenosis. C4-C5: There is a prominent posterior disc osteophyte complex and uncovertebral and facet arthropathy resulting in moderate spinal canal stenosis with mild cord compression and mild bilateral neural foraminal stenosis. C5-C6: There is a prominent posterior disc osteophyte complex resulting in moderate to severe spinal canal stenosis with cord compression and severe bilateral neural foraminal stenosis. C6-C7: There is right worse than left uncovertebral ridging and  bilateral facet arthropathy resulting in moderate right worse than left neural foraminal stenosis without significant spinal canal stenosis. C7-T1: No significant spinal canal or neural foraminal stenosis. MRI THORACIC SPINE FINDINGS Alignment:  Normal. Vertebrae: There is compression deformity of the T5 and T6 vertebral bodies without marrow edema consistent with prior compression fractures. There is abnormal marrow signal in the anterosuperior T3 vertebral body without definite enhancement. There is near complete replacement of the T8 and T9 vertebral bodies with nonenhancing T1 and T2 hypointensity. These findings may reflect treated metastases. Additional nonenhancing marrow signal abnormality is seen in the right T11 vertebral body and posterior elements. There is abnormal enhancing marrow along the superior endplate of V25 towards the left aspect of the vertebral body as well as abnormal marrow enhancement along the posterosuperior T12 endplate consistent with active metastases. There is no abnormal soft tissue extension into the canal or evidence of thoracic cord compression. Cord: The cord is not imaged in the axial plane in the thoracic spine, but there is no abnormal signal abnormality on the T2 sequence. Paraspinal and other soft tissues: The paraspinal soft tissues are unremarkable. Disc levels: There is no evidence of significant spinal canal or neural foraminal stenosis in the thoracic spine. MRI LUMBAR SPINE FINDINGS Segmentation:  Standard. Alignment:  Normal. Vertebrae: There is compression deformity of the L3 vertebral body without marrow edema consistent with prior pathologic fracture, progressed since the prior lumbar spine MRI of 01/05/2020. There is up to approximately 50-60% loss of vertebral body height posteriorly with mild bony retropulsion into the canal. The bony retropulsion has progressed. Vertebral body heights are otherwise preserved. There is patchy marrow enhancement in the L2  vertebral body suspicious for active metastatic disease. There is additional nonenhancing marrow signal abnormality in the L3 through L5 vertebral bodies likely reflecting treated metastases. Conus medullaris: Extends to the L1-L2. There is mild thickening and enhancement of the cauda equina nerve roots suspicious for leptomeningeal disease (46-13). Paraspinal and other soft tissues: The paraspinal soft tissues are unremarkable. Disc levels: There is multilevel facet arthropathy, most advanced at L4-L5 and L5-S1. The disc spaces in the lumbar spine are overall preserved. As above, there is progressed bony retropulsion of the posterosuperior L3 endplate which now results in mild-to-moderate spinal canal stenosis with crowding of the subarticular zones. There is mild left and no significant right neural foraminal stenosis at this level. At L4-L5, there is degenerative endplate change, bilateral facet arthropathy, and a mild disc bulge with a left foraminal component resulting in mild left and no significant right neural foraminal stenosis without significant spinal canal stenosis. Otherwise, there is no high-grade spinal canal or neural foraminal stenosis in the lumbar spine. Other: There is nonenhancing metastatic disease in the bilateral iliac wings, incompletely imaged. IMPRESSION: 1. Diffuse thickening and enhancement of the cauda equina  nerve roots on the postcontrast axial sequence, and punctate focus of enhancement along the left aspect of the cervical cord at the craniocervical junction suspicious for leptomeningeal disease. 2. Extensive marrow signal abnormality in the thoracic and lumbar spine as above. Much of the signal abnormality is nonenhancing, but there is enhancement in the T11, T12, and L2 vertebral bodies suspicious for active metastatic disease. No evidence of enhancing soft tissue component within the canal. 3. Progressed compression deformity of the L3 vertebral body since 01/05/2020 consistent  with pathologic fracture with worsened bony retropulsion. This now result in mild-to-moderate spinal canal stenosis with crowding of the subarticular zones but no evidence of nerve root impingement. 4. Mild compression deformities of the T5 and T6 vertebral body without evidence of bony retropulsion or cord compression. 5. 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 Electronically Signed   By: Valetta Mole M.D.   On: 08/02/2021 16:58    Labs:  CBC: Recent Labs    06/02/21 0854 07/14/21 1433 08/14/21 1159 08/22/21 1525  WBC 5.4 6.2 4.5 3.9*  HGB 10.1* 10.8* 10.1* 10.6*  HCT 30.8* 32.8* 30.8* 32.6*  PLT 53* 135* 52* 112*    COAGS: Recent Labs    08/22/21 1525  INR 1.0  APTT 29    BMP: Recent Labs    06/02/21 0854 07/14/21 1433 08/14/21 1159 08/22/21 1525  NA 142 140 141 139  K 3.8 3.8 3.9 4.1  CL 107 106 106 106  CO2 25 27 25 27   GLUCOSE 114* 113* 101* 111*  BUN 9 10 10 11   CALCIUM 9.0 9.4 9.0 9.2  CREATININE 0.67 0.70 0.73 0.99  GFRNONAA >60 >60 >60 >60    LIVER FUNCTION TESTS: Recent Labs    06/02/21 0854 07/14/21 1433 08/14/21 1159 08/22/21 1525  BILITOT 0.4 0.4 0.5 0.8  AST 17 17 22  36  ALT 22 21 20  58*  ALKPHOS 72 83 76 71  PROT 7.3 7.7 7.6 8.1  ALBUMIN 3.6 3.8 3.9 4.3    TUMOR MARKERS: No results for input(s): AFPTM, CEA, CA199, CHROMGRNA in the last 8760 hours.  Assessment and Plan:  60 y/o F with history of metastatic breast cancer who presents today for microwave ablation of right lobe liver metastatic lesion under general anesthesia.  Risks and benefits discussed with the patient including, but not limited to bleeding, infection, liver failure, bile duct injury, pneumothorax or damage to adjacent structures.  All of the patient's  questions were answered, patient is agreeable to proceed.  Consent signed and in chart.  Thank you for this interesting consult.  I greatly enjoyed meeting BABY GIEGER and look forward to participating in their care.  A copy of this report was sent to the requesting provider on this date.  Electronically Signed: Joaquim Nam, PA-C 08/26/2021, 2:27 PM   I spent a total of  25 Minutes in face to face in clinical consultation, greater than 50% of which was counseling/coordinating care for liver microwave ablation.

## 2021-08-26 NOTE — Anesthesia Preprocedure Evaluation (Addendum)
Anesthesia Evaluation  Patient identified by MRN, date of birth, ID band Patient awake    Reviewed: Allergy & Precautions, H&P , NPO status , Patient's Chart, lab work & pertinent test results  Airway Mallampati: III  TM Distance: >3 FB Neck ROM: Full    Dental no notable dental hx. (+) Teeth Intact, Dental Advisory Given   Pulmonary neg pulmonary ROS,    Pulmonary exam normal breath sounds clear to auscultation       Cardiovascular Exercise Tolerance: Good hypertension, Pt. on medications  Rhythm:Regular Rate:Normal     Neuro/Psych Depression negative neurological ROS     GI/Hepatic Neg liver ROS, GERD  ,  Endo/Other  diabetes  Renal/GU negative Renal ROS  negative genitourinary   Musculoskeletal  (+) Arthritis , Osteoarthritis,    Abdominal   Peds  Hematology  (+) Blood dyscrasia, anemia ,   Anesthesia Other Findings   Reproductive/Obstetrics negative OB ROS                            Anesthesia Physical Anesthesia Plan  ASA: 3  Anesthesia Plan: General   Post-op Pain Management: Tylenol PO (pre-op)   Induction: Intravenous  PONV Risk Score and Plan: 4 or greater and Ondansetron, Dexamethasone and Midazolam  Airway Management Planned: Oral ETT  Additional Equipment:   Intra-op Plan:   Post-operative Plan: Extubation in OR  Informed Consent: I have reviewed the patients History and Physical, chart, labs and discussed the procedure including the risks, benefits and alternatives for the proposed anesthesia with the patient or authorized representative who has indicated his/her understanding and acceptance.     Dental advisory given  Plan Discussed with: CRNA  Anesthesia Plan Comments:        Anesthesia Quick Evaluation

## 2021-08-27 ENCOUNTER — Observation Stay (HOSPITAL_COMMUNITY)
Admission: RE | Admit: 2021-08-27 | Discharge: 2021-08-27 | Disposition: A | Discharge status: Home / Self Care | Payer: Medicaid Other | Source: Ambulatory Visit | Attending: Interventional Radiology | Admitting: Interventional Radiology

## 2021-08-27 ENCOUNTER — Ambulatory Visit (HOSPITAL_COMMUNITY): Payer: Medicaid Other | Admitting: Anesthesiology

## 2021-08-27 ENCOUNTER — Encounter (HOSPITAL_COMMUNITY): Payer: Self-pay | Admitting: Interventional Radiology

## 2021-08-27 ENCOUNTER — Observation Stay (HOSPITAL_COMMUNITY)
Admission: RE | Admit: 2021-08-27 | Discharge: 2021-08-27 | Disposition: A | Discharge status: Home / Self Care | Payer: Medicaid Other | Attending: Interventional Radiology | Admitting: Interventional Radiology

## 2021-08-27 ENCOUNTER — Encounter (HOSPITAL_COMMUNITY): Payer: Self-pay

## 2021-08-27 ENCOUNTER — Other Ambulatory Visit (HOSPITAL_COMMUNITY): Payer: Self-pay

## 2021-08-27 ENCOUNTER — Encounter (HOSPITAL_COMMUNITY)
Admission: RE | Disposition: A | Discharge status: Home / Self Care | Payer: Self-pay | Source: Home / Self Care | Attending: Interventional Radiology

## 2021-08-27 DIAGNOSIS — I10 Essential (primary) hypertension: Secondary | ICD-10-CM | POA: Insufficient documentation

## 2021-08-27 DIAGNOSIS — C50911 Malignant neoplasm of unspecified site of right female breast: Principal | ICD-10-CM | POA: Insufficient documentation

## 2021-08-27 DIAGNOSIS — Z01818 Encounter for other preprocedural examination: Secondary | ICD-10-CM

## 2021-08-27 DIAGNOSIS — C50919 Malignant neoplasm of unspecified site of unspecified female breast: Secondary | ICD-10-CM

## 2021-08-27 DIAGNOSIS — Z79899 Other long term (current) drug therapy: Secondary | ICD-10-CM | POA: Insufficient documentation

## 2021-08-27 DIAGNOSIS — C787 Secondary malignant neoplasm of liver and intrahepatic bile duct: Secondary | ICD-10-CM | POA: Insufficient documentation

## 2021-08-27 DIAGNOSIS — E119 Type 2 diabetes mellitus without complications: Secondary | ICD-10-CM | POA: Diagnosis not present

## 2021-08-27 HISTORY — PX: RADIOLOGY WITH ANESTHESIA: SHX6223

## 2021-08-27 LAB — CBC
HCT: 31.1 % — ABNORMAL LOW (ref 36.0–46.0)
Hemoglobin: 10.2 g/dL — ABNORMAL LOW (ref 12.0–15.0)
MCH: 35.5 pg — ABNORMAL HIGH (ref 26.0–34.0)
MCHC: 32.8 g/dL (ref 30.0–36.0)
MCV: 108.4 fL — ABNORMAL HIGH (ref 80.0–100.0)
Platelets: 166 10*3/uL (ref 150–400)
RBC: 2.87 MIL/uL — ABNORMAL LOW (ref 3.87–5.11)
RDW: 15.1 % (ref 11.5–15.5)
WBC: 5.8 10*3/uL (ref 4.0–10.5)
nRBC: 0 % (ref 0.0–0.2)

## 2021-08-27 LAB — COMPREHENSIVE METABOLIC PANEL
ALT: 33 U/L (ref 0–44)
AST: 23 U/L (ref 15–41)
Albumin: 4 g/dL (ref 3.5–5.0)
Alkaline Phosphatase: 64 U/L (ref 38–126)
Anion gap: 11 (ref 5–15)
BUN: 12 mg/dL (ref 6–20)
CO2: 25 mmol/L (ref 22–32)
Calcium: 9 mg/dL (ref 8.9–10.3)
Chloride: 101 mmol/L (ref 98–111)
Creatinine, Ser: 0.8 mg/dL (ref 0.44–1.00)
GFR, Estimated: >60 mL/min (ref >60)
Glucose, Bld: 122 mg/dL — ABNORMAL HIGH (ref 70–99)
Potassium: 3.3 mmol/L — ABNORMAL LOW (ref 3.5–5.1)
Sodium: 137 mmol/L (ref 135–145)
Total Bilirubin: 0.7 mg/dL (ref 0.3–1.2)
Total Protein: 7.8 g/dL (ref 6.5–8.1)

## 2021-08-27 LAB — TYPE AND SCREEN
ABO/RH(D): A POS
Antibody Screen: POSITIVE
Unit division: 0
Unit division: 0

## 2021-08-27 LAB — GLUCOSE, CAPILLARY
Glucose-Capillary: 120 mg/dL — ABNORMAL HIGH (ref 70–99)
Glucose-Capillary: 108 mg/dL — ABNORMAL HIGH (ref 70–99)

## 2021-08-27 LAB — BPAM RBC
Blood Product Expiration Date: 202301012359
Blood Product Expiration Date: 202301012359
Unit Type and Rh: 6200
Unit Type and Rh: 6200

## 2021-08-27 SURGERY — IR WITH ANESTHESIA
Anesthesia: General

## 2021-08-27 MED ORDER — FENTANYL CITRATE (PF) 100 MCG/2ML IJ SOLN
INTRAMUSCULAR | Status: DC | PRN
  Administered 2021-08-27 (×4): 50 ug via INTRAVENOUS

## 2021-08-27 MED ORDER — SENNOSIDES-DOCUSATE SODIUM 8.6-50 MG PO TABS
1.0000 | ORAL_TABLET | Freq: Every day | ORAL | Status: DC | PRN
  Filled 2021-08-27: qty 1

## 2021-08-27 MED ORDER — HYDROCODONE-ACETAMINOPHEN 5-325 MG PO TABS
ORAL_TABLET | ORAL | Status: AC
  Administered 2021-08-27: 12:00:00 2 via ORAL
  Filled 2021-08-27: qty 2

## 2021-08-27 MED ORDER — DEXAMETHASONE SODIUM PHOSPHATE 10 MG/ML IJ SOLN
INTRAMUSCULAR | Status: DC | PRN
  Administered 2021-08-27: 10 mg via INTRAVENOUS

## 2021-08-27 MED ORDER — PROPOFOL 10 MG/ML IV BOLUS
INTRAVENOUS | Status: DC | PRN
  Administered 2021-08-27: 100 mg via INTRAVENOUS

## 2021-08-27 MED ORDER — PANTOPRAZOLE SODIUM 40 MG IV SOLR
INTRAVENOUS | Status: AC
  Filled 2021-08-27: qty 40

## 2021-08-27 MED ORDER — HYDROCODONE-ACETAMINOPHEN 5-325 MG PO TABS: 1.0000 | ORAL_TABLET | Freq: Four times a day (QID) | ORAL | 0 refills | Status: AC | PRN

## 2021-08-27 MED ORDER — GLYCOPYRROLATE 0.2 MG/ML IJ SOLN
INTRAMUSCULAR | Status: DC | PRN
  Administered 2021-08-27: .1 mg via INTRAVENOUS

## 2021-08-27 MED ORDER — MIDAZOLAM HCL 2 MG/2ML IJ SOLN
INTRAMUSCULAR | Status: AC
  Filled 2021-08-27: qty 2

## 2021-08-27 MED ORDER — PHENYLEPHRINE 40 MCG/ML (10ML) SYRINGE FOR IV PUSH (FOR BLOOD PRESSURE SUPPORT)
PREFILLED_SYRINGE | INTRAVENOUS | Status: DC | PRN
  Administered 2021-08-27: 80 ug via INTRAVENOUS

## 2021-08-27 MED ORDER — PANTOPRAZOLE SODIUM 40 MG IV SOLR
40.0000 mg | Freq: Once | INTRAVENOUS | Status: AC
  Administered 2021-08-27: 09:00:00 40 mg via INTRAVENOUS

## 2021-08-27 MED ORDER — LIDOCAINE 2% (20 MG/ML) 5 ML SYRINGE
INTRAMUSCULAR | Status: DC | PRN
  Administered 2021-08-27: 50 mg via INTRAVENOUS

## 2021-08-27 MED ORDER — DOCUSATE SODIUM 100 MG PO CAPS
100.0000 mg | ORAL_CAPSULE | Freq: Two times a day (BID) | ORAL | Status: DC
  Filled 2021-08-27: qty 1

## 2021-08-27 MED ORDER — SODIUM CHLORIDE 0.9 % IV SOLN: INTRAVENOUS | Status: DC

## 2021-08-27 MED ORDER — ROCURONIUM BROMIDE 10 MG/ML (PF) SYRINGE
PREFILLED_SYRINGE | INTRAVENOUS | Status: DC | PRN
  Administered 2021-08-27: 40 mg via INTRAVENOUS
  Administered 2021-08-27: 20 mg via INTRAVENOUS

## 2021-08-27 MED ORDER — PHENYLEPHRINE HCL-NACL 20-0.9 MG/250ML-% IV SOLN
INTRAVENOUS | Status: DC | PRN
  Administered 2021-08-27: 50 ug/min via INTRAVENOUS

## 2021-08-27 MED ORDER — SODIUM CHLORIDE 0.9 % IV SOLN
8.0000 mg | Freq: Once | INTRAVENOUS | Status: AC
  Administered 2021-08-27: 10:00:00 8 mg via INTRAVENOUS
  Filled 2021-08-27: qty 4

## 2021-08-27 MED ORDER — ACETAMINOPHEN 500 MG PO TABS
1000.0000 mg | ORAL_TABLET | Freq: Once | ORAL | Status: AC
  Administered 2021-08-27: 08:00:00 1000 mg via ORAL
  Filled 2021-08-27: qty 2

## 2021-08-27 MED ORDER — FENTANYL CITRATE (PF) 250 MCG/5ML IJ SOLN
INTRAMUSCULAR | Status: AC
  Filled 2021-08-27: qty 5

## 2021-08-27 MED ORDER — ORAL CARE MOUTH RINSE: 15.0000 mL | Freq: Once | OROMUCOSAL | Status: AC

## 2021-08-27 MED ORDER — SODIUM CHLORIDE 0.9 % IV SOLN
8.0000 mg | Freq: Once | INTRAVENOUS | Status: DC
  Filled 2021-08-27: qty 4

## 2021-08-27 MED ORDER — MIDAZOLAM HCL 5 MG/5ML IJ SOLN
INTRAMUSCULAR | Status: DC | PRN
  Administered 2021-08-27: 2 mg via INTRAVENOUS

## 2021-08-27 MED ORDER — LACTATED RINGERS IV SOLN: INTRAVENOUS | Status: DC

## 2021-08-27 MED ORDER — ONDANSETRON HCL 4 MG/2ML IJ SOLN: 4.0000 mg | Freq: Four times a day (QID) | INTRAMUSCULAR | Status: DC | PRN

## 2021-08-27 MED ORDER — PIPERACILLIN-TAZOBACTAM 3.375 G IVPB
3.3750 g | Freq: Once | INTRAVENOUS | Status: AC
  Administered 2021-08-27: 09:00:00 3.375 g via INTRAVENOUS
  Filled 2021-08-27: qty 50

## 2021-08-27 MED ORDER — CHLORHEXIDINE GLUCONATE 0.12 % MT SOLN
15.0000 mL | Freq: Once | OROMUCOSAL | Status: AC
  Administered 2021-08-27: 08:00:00 15 mL via OROMUCOSAL
  Filled 2021-08-27: qty 15

## 2021-08-27 MED ORDER — SUGAMMADEX SODIUM 500 MG/5ML IV SOLN
INTRAVENOUS | Status: DC | PRN
Start: 2021-08-27 — End: 2021-08-27
  Administered 2021-08-27: 500 mg via INTRAVENOUS

## 2021-08-27 MED ORDER — FENTANYL CITRATE PF 50 MCG/ML IJ SOSY: 25.0000 ug | PREFILLED_SYRINGE | INTRAMUSCULAR | Status: DC | PRN

## 2021-08-27 MED ORDER — HYDROCODONE-ACETAMINOPHEN 5-325 MG PO TABS: 1.0000 | ORAL_TABLET | ORAL | Status: DC | PRN

## 2021-08-27 NOTE — Transfer of Care (Signed)
Immediate Anesthesia Transfer of Care Note  Patient: Erin [name]  Procedure(s) Performed: Procedure(s): IR WITH ANESTHESIA MICROWAVE ABLATION OF LIVER (N/A)  Patient Location: PACU  Anesthesia Type:General  Level of Consciousness: Alert, Awake, Oriented  Airway & Oxygen Therapy: Patient Spontanous Breathing  Post-op Assessment: Report given to RN  Post vital signs: Reviewed and stable  Last Vitals:  Vitals:   08/27/21 0704 08/27/21 1100  BP: 116/74 (!) 141/71  Pulse: 94 83  Resp: 16 (!) 23  Temp: 37.4 C   SpO2: 43% 200%    Complications: No apparent anesthesia complications

## 2021-08-27 NOTE — Procedures (Signed)
Interventional Radiology Procedure Note  Procedure: Image guided thermal ablation of liver  Anesthesia: General  Complications: None  Estimated Blood Loss: < 10 mL  Findings: Central, hypoechoic lesion in right lobe of liver measures approximately 1.6 x 2 cm. Thermal ablation performed with NeuWave MWA system after placement of single XT probe under US guidance. CT performed to confirm probe course and position. Ablation for 1 min at 95W followed by 10 min at 65W.   Plan: PACU recovery with possible discharge today versus overnight observation depending on degree of post-op pain.  Venetia Night. [name], M.D Pager:  [phone]

## 2021-08-27 NOTE — Procedures (Deleted)
  The note originally documented on this encounter has been moved the the encounter in which it belongs.  

## 2021-08-27 NOTE — Discharge Summary (Signed)
Patient ID: Erin [name] MRN: [id number] DOB/AGE: [birth year] 60 y.o.  Admit date: 08/27/2021 Discharge date: 08/27/2021  Supervising Physician: Aletta Edouard  Patient Status: [facility] - In-pt  Admission Diagnoses: Metastatic breast cancer  Discharge Diagnoses:  Principal Problem:   Metastatic breast carcinoma [facility])   Discharged Condition: good  Hospital Course: 60 y/o F with metastatic breast cancer who presented to [facility] IR today for percutaneous thermal ablation of right lobe liver metastasis. The procedure was performed without complication and the patient was observed for 6 hours post procedure, she had an uneventful post procedure course and was deemed stable for discharge this evening.   Patient will follow up with Dr. [name] in 3-4 weeks, she was encouraged to call with any questions or concerns prior to her appointment. eRx for Norco 5/325 mg PO Q6H PRN #20 sent today, patient reports having zofran at home already. ED return precautions reviewed to which patient stated understanding and agreement for discharge home.  Consults: None  Significant Diagnostic Studies: CT GUIDE TISSUE ABLATION  Result Date: 08/27/2021 CLINICAL DATA:  History of breast carcinoma metastatic to the liver with solitary lesion currently in the central aspect of the right lobe of the liver centered between segments VI and VII by MRI. The patient presents for percutaneous thermal ablation of the metastatic lesion. EXAM: CT-GUIDED PERCUTANEOUS THERMAL ABLATION OF LIVER METASTASIS COMPARISON:  MRI of the abdomen on 07/21/2021 ANESTHESIA/SEDATION: Anesthesia:  General MEDICATIONS: 3.375 g IV Zosyn. The antibiotic was administered in an appropriate time interval prior to needle puncture of the skin. CONTRAST:  None. PROCEDURE: The procedure, risks, benefits, and alternatives were explained to the patient. Questions regarding the procedure were encouraged and answered. The patient understands and consents to  the procedure. The patient was placed under general anesthesia. A time-out was performed. Initial unenhanced CT was performed in a supine and slightly oblique position to localize the liver. The abdominal wall was prepped with chlorhexidine in a sterile fashion, and a sterile drape was applied covering the operative field. A sterile gown and sterile gloves were used for the procedure. Under ultrasound guidance, a 15 cm length NeuWave XT percutaneous microwave ablation probe was advanced into a liver mass. Probe positioning was confirmed by ultrasound and CT prior to ablation. Microwave thermal ablation was performed through the single probe. A 1 minute cycle of ablation was performed at 95 W followed by a 10 minute cycle of ablation at 64 W. Ablation was monitored by ultrasound. The percutaneous tract was then cauterized as the needle was retracted out of the liver. COMPLICATIONS: None FINDINGS: The central metastatic lesion in the right lobe of the liver is well visualized by ultrasound and is hypoechoic and oval in shape measuring approximately 1.5 x 2.1 cm. During ablation, there was formation gas bubbles throughout the tumor and in the adjacent parenchyma. IMPRESSION: Image guided percutaneous thermal ablation of right lobe liver metastasis. Electronically Signed   By: Aletta Edouard M.D.   On: 08/27/2021 11:45   ECHOCARDIOGRAM COMPLETE  Result Date: 08/05/2021    ECHOCARDIOGRAM REPORT   Patient Name:   Erin [name] Date of Exam: 08/05/2021 Medical Rec #:  [id number]      Height:       61.0 in Accession #:    [id number]     Weight:       144.6 lb Date of Birth:  [birth year]     BSA:          1.646 m Patient Age:  60 years       BP:           118/68 mmHg Patient Gender: F              HR:           86 bpm. Exam Location:  Outpatient Procedure: 2D Echo, 3D Echo, Cardiac Doppler, Color Doppler and Strain Analysis Indications:    Z51.11 Encounter for antineoplastic chemotheraphy  History:        Patient has  prior history of Echocardiogram examinations, most                 recent 11/14/2019. Risk Factors:Diabetes. Metastatic cancer.  Sonographer:    Roseanna Rainbow RDCS Referring Phys: [id number] [name]  Sonographer Comments: Technically difficult study due to poor echo windows. Image acquisition challenging due to respiratory motion. Global longitudinal strain was attempted. IMPRESSIONS  1. Left ventricular ejection fraction, by estimation, is 60 to 65%. The left ventricle has normal function. The left ventricle has no regional wall motion abnormalities. Left ventricular diastolic parameters are consistent with Grade I diastolic dysfunction (impaired relaxation). The average left ventricular global longitudinal strain is -21.8 %. The global longitudinal strain is normal.  2. Right ventricular systolic function is normal. The right ventricular size is normal. There is normal pulmonary artery systolic pressure.  3. The mitral valve is normal in structure. No evidence of mitral valve regurgitation. No evidence of mitral stenosis.  4. The aortic valve is tricuspid. There is mild calcification of the aortic valve. Aortic valve regurgitation is not visualized. No aortic stenosis is present.  5. The inferior vena cava is normal in size with greater than 50% respiratory variability, suggesting right atrial pressure of 3 mmHg. FINDINGS  Left Ventricle: Left ventricular ejection fraction, by estimation, is 60 to 65%. The left ventricle has normal function. The left ventricle has no regional wall motion abnormalities. The average left ventricular global longitudinal strain is -21.8 %. The global longitudinal strain is normal. The left ventricular internal cavity size was normal in size. There is no left ventricular hypertrophy. Left ventricular diastolic parameters are consistent with Grade I diastolic dysfunction (impaired relaxation). Right Ventricle: The right ventricular size is normal. No increase in right ventricular wall  thickness. Right ventricular systolic function is normal. There is normal pulmonary artery systolic pressure. The tricuspid regurgitant velocity is 2.05 m/s, and  with an assumed right atrial pressure of 3 mmHg, the estimated right ventricular systolic pressure is 76.5 mmHg. Left Atrium: Left atrial size was normal in size. Right Atrium: Right atrial size was normal in size. Pericardium: There is no evidence of pericardial effusion. Mitral Valve: The mitral valve is normal in structure. Mild mitral annular calcification. No evidence of mitral valve regurgitation. No evidence of mitral valve stenosis. Tricuspid Valve: The tricuspid valve is normal in structure. Tricuspid valve regurgitation is trivial. No evidence of tricuspid stenosis. Aortic Valve: The aortic valve is tricuspid. There is mild calcification of the aortic valve. Aortic valve regurgitation is not visualized. No aortic stenosis is present. Pulmonic Valve: The pulmonic valve was normal in structure. Pulmonic valve regurgitation is not visualized. No evidence of pulmonic stenosis. Aorta: The aortic root is normal in size and structure. Venous: The inferior vena cava is normal in size with greater than 50% respiratory variability, suggesting right atrial pressure of 3 mmHg. IAS/Shunts: No atrial level shunt detected by color flow Doppler.  LEFT VENTRICLE PLAX 2D LVIDd:  4.10 cm     Diastology LVIDs:         2.40 cm     LV e' medial:    8.49 cm/s LV PW:         0.90 cm     LV E/e' medial:  8.1 LV IVS:        0.90 cm     LV e' lateral:   9.68 cm/s LVOT diam:     1.90 cm     LV E/e' lateral: 7.1 LV SV:         48 LV SV Index:   29          2D Longitudinal Strain LVOT Area:     2.84 cm    2D Strain GLS Avg:     -21.8 %  LV Volumes (MOD) LV vol d, MOD A2C: 46.6 ml 3D Volume EF: LV vol d, MOD A4C: 37.2 ml 3D EF:        52 % LV vol s, MOD A2C: 15.5 ml LV EDV:       76 ml LV vol s, MOD A4C: 15.5 ml LV ESV:       36 ml LV SV MOD A2C:     31.1 ml LV SV:         40 ml LV SV MOD A4C:     37.2 ml LV SV MOD BP:      27.0 ml RIGHT VENTRICLE             IVC RV S prime:     78.80 cm/s  IVC diam: 0.90 cm TAPSE (M-mode): 2.5 cm LEFT ATRIUM             Index        RIGHT ATRIUM          Index LA diam:        3.20 cm 1.94 cm/m   RA Area:     9.41 cm LA Vol (A2C):   23.7 ml 14.40 ml/m  RA Volume:   16.40 ml 9.97 ml/m LA Vol (A4C):   11.6 ml 7.05 ml/m LA Biplane Vol: 16.2 ml 9.84 ml/m  AORTIC VALVE LVOT Vmax:   98.00 cm/s LVOT Vmean:  61.600 cm/s LVOT VTI:    0.169 m  AORTA Ao Root diam: 2.90 cm Ao Asc diam:  2.80 cm MITRAL VALVE               TRICUSPID VALVE MV Area (PHT): 4.39 cm    TR Peak grad:   16.8 mmHg MV Decel Time: 173 msec    TR Vmax:        205.00 cm/s MV E velocity: 68.90 cm/s MV A velocity: 77.10 cm/s  SHUNTS MV E/A ratio:  0.89        Systemic VTI:  0.17 m                            Systemic Diam: 1.90 cm Glori Bickers MD Electronically signed by Glori Bickers MD Signature Date/Time: 08/05/2021/12:48:21 PM    Final    MR TOTAL SPINE METS SCREENING  Result Date: 08/02/2021 CLINICAL DATA:  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 CONTRAST:  6.55mL  GADAVIST GADOBUTROL 1 MMOL/ML IV SOLN COMPARISON:  CT chest/abdomen/pelvis 07/01/2021, lumbar spine MRI 01/05/2020 FINDINGS: MRI CERVICAL SPINE FINDINGS Alignment: Normal. Vertebrae: There is no evidence of osseous metastatic disease in the cervical spine. Vertebral body heights are preserved. 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 There is a punctate focus of enhancement along the left surface of the cord at the craniocervical junction  (36-2). Posterior Fossa, vertebral arteries, paraspinal tissues: There is a small focus of abnormal enhancement along the left inferior cerebellar folia corresponding to a finding on the prior brain MRI and likely reflecting leptomeningeal disease (36-1). The posterior fossa is otherwise not well assessed. The paraspinal soft tissues are unremarkable. The vertebral artery flow voids are present. Disc levels: C2-C3: No significant spinal canal or neural foraminal stenosis. C3-C4: There is a prominent central disc protrusion and uncovertebral and bilateral facet arthropathy resulting in mild spinal canal stenosis and mild bilateral neural foraminal stenosis. C4-C5: There is a prominent posterior disc osteophyte complex and uncovertebral and facet arthropathy resulting in moderate spinal canal stenosis with mild cord compression and mild bilateral neural foraminal stenosis. C5-C6: There is a prominent posterior disc osteophyte complex resulting in moderate to severe spinal canal stenosis with cord compression and severe bilateral neural foraminal stenosis. C6-C7: There is right worse than left uncovertebral ridging and bilateral facet arthropathy resulting in moderate right worse than left neural foraminal stenosis without significant spinal canal stenosis. C7-T1: No significant spinal canal or neural foraminal stenosis. MRI THORACIC SPINE FINDINGS Alignment:  Normal. Vertebrae: There is compression deformity of the T5 and T6 vertebral bodies without marrow edema consistent with prior compression fractures. There is abnormal marrow signal in the anterosuperior T3 vertebral body without definite enhancement. There is near complete replacement of the T8 and T9 vertebral bodies with nonenhancing T1 and T2 hypointensity. These findings may reflect treated metastases. Additional nonenhancing marrow signal abnormality is seen in the right T11 vertebral body and posterior elements. There is abnormal enhancing marrow along the  superior endplate of N56 towards the left aspect of the vertebral body as well as abnormal marrow enhancement along the posterosuperior T12 endplate consistent with active metastases. There is no abnormal soft tissue extension into the canal or evidence of thoracic cord compression. Cord: The cord is not imaged in the axial plane in the thoracic spine, but there is no abnormal signal abnormality on the T2 sequence. Paraspinal and other soft tissues: The paraspinal soft tissues are unremarkable. Disc levels: There is no evidence of significant spinal canal or neural foraminal stenosis in the thoracic spine. MRI LUMBAR SPINE FINDINGS Segmentation:  Standard. Alignment:  Normal. Vertebrae: There is compression deformity of the L3 vertebral body without marrow edema consistent with prior pathologic fracture, progressed since the prior lumbar spine MRI of 01/05/2020. There is up to approximately 50-60% loss of vertebral body height posteriorly with mild bony retropulsion into the canal. The bony retropulsion has progressed. Vertebral body heights are otherwise preserved. There is patchy marrow enhancement in the L2 vertebral body suspicious for active metastatic disease. There is additional nonenhancing marrow signal abnormality in the L3 through L5 vertebral bodies likely reflecting treated metastases. Conus medullaris: Extends to the L1-L2. There is mild thickening and enhancement of the cauda equina nerve roots suspicious for leptomeningeal disease (46-13). Paraspinal and other soft tissues: The paraspinal soft tissues are unremarkable. Disc levels: There is multilevel facet arthropathy, most advanced at L4-L5 and  L5-S1. The disc spaces in the lumbar spine are overall preserved. As above, there is progressed bony retropulsion of the posterosuperior L3 endplate which now results in mild-to-moderate spinal canal stenosis with crowding of the subarticular zones. There is mild left and no significant right neural  foraminal stenosis at this level. At L4-L5, there is degenerative endplate change, bilateral facet arthropathy, and a mild disc bulge with a left foraminal component resulting in mild left and no significant right neural foraminal stenosis without significant spinal canal stenosis. Otherwise, there is no high-grade spinal canal or neural foraminal stenosis in the lumbar spine. Other: There is nonenhancing metastatic disease in the bilateral iliac wings, incompletely imaged. IMPRESSION: 1. Diffuse thickening and enhancement of the cauda equina nerve roots on the postcontrast axial sequence, and punctate focus of enhancement along the left aspect of the cervical cord at the craniocervical junction suspicious for leptomeningeal disease. 2. Extensive marrow signal abnormality in the thoracic and lumbar spine as above. Much of the signal abnormality is nonenhancing, but there is enhancement in the T11, T12, and L2 vertebral bodies suspicious for active metastatic disease. No evidence of enhancing soft tissue component within the canal. 3. Progressed compression deformity of the L3 vertebral body since 01/05/2020 consistent with pathologic fracture with worsened bony retropulsion. This now result in mild-to-moderate spinal canal stenosis with crowding of the subarticular zones but no evidence of nerve root impingement. 4. Mild compression deformities of the T5 and T6 vertebral body without evidence of bony retropulsion or cord compression. 5. 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 Electronically Signed   By: Valetta Mole M.D.   On: 08/02/2021 16:58    Treatments: IV hydration and analgesia: Vicodin  Discharge Exam: Blood pressure (!) 108/57, pulse 84, temperature (!) 97.5 F (36.4 C), resp. rate 17,  weight 136 lb (61.7 kg), last menstrual period 07/09/2017, SpO2 96 %. Physical Exam Vitals and nursing note reviewed.  Constitutional:      General: She is not in acute distress. HENT:     Head: Normocephalic.  Cardiovascular:     Rate and Rhythm: Normal rate.  Pulmonary:     Effort: Pulmonary effort is normal.  Abdominal:     Palpations: Abdomen is soft.  Skin:    General: Skin is warm and dry.  Neurological:     Mental Status: She is alert. Mental status is at baseline.    Disposition:    Allergies as of 08/27/2021   No Known Allergies      Medication List     TAKE these medications    Accu-Chek FastClix Lancets Misc Use as instructed. Inject into the skin twice daily   Accu-Chek Guide Control Liqd 1 each by In Vitro route once as needed for up to 1 dose.   Accu-Chek Guide test strip Generic drug: glucose blood Use as instructed. Check blood glucose by fingerstick twice per day.   atorvastatin 40 MG tablet Commonly known as: LIPITOR Take 1 tablet (40 mg total) by mouth daily.   capecitabine 500 MG tablet Commonly known as: XELODA Take 2 tablets (1,000 mg total) by mouth 2 (two) times daily after a meal. Take for 14 days on and 7 days off, repeat every 21 days.   CINNAMON PO Take 2,400 mg by mouth every evening. 1200 mg per cap   diclofenac  Sodium 1 % Gel Commonly known as: VOLTAREN Apply 1 application topically daily as needed (pain).   HYDROcodone-acetaminophen 5-325 MG tablet Commonly known as: Norco Take 1 tablet by mouth every 6 (six) hours as needed for up to 5 days for moderate pain or severe pain.   lidocaine-prilocaine cream Commonly known as: EMLA Apply 1 application topically as needed. What changed: Another medication with the same name was changed. Make sure you understand how and when to take each.   lidocaine-prilocaine cream Commonly known as: EMLA Apply to affected area once What changed:  how much to take how to take this when  to take this reasons to take this   loperamide 2 MG capsule Commonly known as: IMODIUM Take 2 mg by mouth as needed for diarrhea or loose stools.   memantine 5 MG tablet Commonly known as: Namenda Begin this prescription the first day of brain radiation. Week 1: take one tablet po qam. Week 2: take one tablet qam and qpm. Week 3: take two tablets qam, and one tablet po q pm. Week 4: take two tablets qam and qpm. Fill subsequent prescription q month. What changed:  how much to take how to take this when to take this   memantine 10 MG tablet Commonly known as: Namenda Take 1 tablet (10 mg total) by mouth 2 (two) times daily. What changed: Another medication with the same name was changed. Make sure you understand how and when to take each.   Multivitamin Adult Chew Chew 2 each by mouth daily.   ondansetron 4 MG disintegrating tablet Commonly known as: Zofran ODT Take 1 tablet (4 mg total) by mouth every 8 (eight) hours as needed for nausea or vomiting.   ondansetron 8 MG tablet Commonly known as: Zofran Take 1 tablet (8 mg total) by mouth 2 (two) times daily as needed (Nausea or vomiting).   prochlorperazine 10 MG tablet Commonly known as: COMPAZINE Take 1 tablet (10 mg total) by mouth every 6 (six) hours as needed (Nausea or vomiting).   Tukysa 150 MG tablet Generic drug: tucatinib Take 2 tablets (300 mg total) by mouth 2 (two) times daily. Take every 12 hrs at the same time each day with or without a meal.        Follow-up Information     Aletta Edouard, MD Follow up.   Specialties: Interventional Radiology, Radiology Why: IR scheduler will call you with appointment date/time (typically 3-4 weeks after your procedure). Please call with any questions or concerns prior to your appointment. Contact information: [address] [place] [address] [phone]                  Electronically Signed: Joaquim Nam, PA-C 08/27/2021, 4:36  PM   I have spent Less Than 30 Minutes discharging [name].

## 2021-08-27 NOTE — Anesthesia Procedure Notes (Signed)
Procedure Name: Intubation Date/Time: 08/27/2021 8:51 AM Performed by: Gerald Leitz, CRNA Pre-anesthesia Checklist: Patient identified, Patient being monitored, Timeout performed, Emergency Drugs available and Suction available Patient Re-evaluated:Patient Re-evaluated prior to induction Oxygen Delivery Method: Circle system utilized Preoxygenation: Pre-oxygenation with 100% oxygen Induction Type: IV induction Ventilation: Mask ventilation without difficulty Laryngoscope Size: Mac and 3 Grade View: Grade I Tube type: Oral Tube size: 7.0 mm Number of attempts: 1 Placement Confirmation: ETT inserted through vocal cords under direct vision, positive ETCO2 and breath sounds checked- equal and bilateral Secured at: 21 cm Tube secured with: Tape Dental Injury: Teeth and Oropharynx as per pre-operative assessment

## 2021-08-27 NOTE — Sedation Documentation (Signed)
Anesthesia in to sedate and monitor. 

## 2021-08-27 NOTE — Anesthesia Postprocedure Evaluation (Signed)
Anesthesia Post Note  Patient: Erin [name]  Procedure(s) Performed: IR WITH ANESTHESIA MICROWAVE ABLATION OF LIVER     Patient location during evaluation: PACU Anesthesia Type: General Level of consciousness: awake and alert Pain management: pain level controlled Vital Signs Assessment: post-procedure vital signs reviewed and stable Respiratory status: spontaneous breathing, nonlabored ventilation and respiratory function stable Cardiovascular status: blood pressure returned to baseline and stable Postop Assessment: no apparent nausea or vomiting Anesthetic complications: no   No notable events documented.  Last Vitals:  Vitals:   08/27/21 1115 08/27/21 1130  BP: 139/72 127/77  Pulse: 77 76  Resp: 18 15  Temp:    SpO2: 97% 100%    Last Pain:  Vitals:   08/27/21 1145  TempSrc:   PainSc: 7                  Gaven Eugene,W. EDMOND

## 2021-08-28 ENCOUNTER — Encounter (HOSPITAL_COMMUNITY): Payer: Self-pay | Admitting: Interventional Radiology

## 2021-08-28 ENCOUNTER — Telehealth: Payer: Self-pay

## 2021-08-28 NOTE — Telephone Encounter (Signed)
Transition Care Management Unsuccessful Follow-up Telephone Call  Date of discharge and from where:  08/27/2021 from [facility]  Attempts:  1st Attempt  Reason for unsuccessful TCM follow-up call:  Left voice message

## 2021-08-29 ENCOUNTER — Other Ambulatory Visit (HOSPITAL_COMMUNITY): Payer: Self-pay

## 2021-08-29 NOTE — Telephone Encounter (Signed)
Transition Care Management Unsuccessful Follow-up Telephone Call  Date of discharge and from where:  08/27/2021[facility]  Attempts:  2nd Attempt  Reason for unsuccessful TCM follow-up call:  Left voice message

## 2021-08-31 LAB — BPAM RBC
Blood Product Expiration Date: 202301012359
Blood Product Expiration Date: 202301012359
Unit Type and Rh: 6200
Unit Type and Rh: 6200

## 2021-08-31 LAB — TYPE AND SCREEN
ABO/RH(D): A POS
Antibody Screen: POSITIVE
Donor AG Type: NEGATIVE
Donor AG Type: NEGATIVE
Unit division: 0
Unit division: 0

## 2021-09-01 ENCOUNTER — Encounter: Payer: Self-pay | Admitting: Hematology and Oncology

## 2021-09-01 NOTE — Progress Notes (Signed)
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 She was started on Namenda as well.   Plan: The patient will receive a call in about one month from the radiation oncology department. She will continue follow up with Dr. Jana Hakim and Dr. Chryl Heck as well.   ________________________________________________    Carola Rhine, [facility]

## 2021-09-01 NOTE — Telephone Encounter (Signed)
Transition Care Management Unsuccessful Follow-up Telephone Call  Date of discharge and from where:  08/27/2021 from [facility]  Attempts:  3rd Attempt  Reason for unsuccessful TCM follow-up call:  Left voice message

## 2021-09-03 ENCOUNTER — Encounter: Payer: Self-pay | Admitting: Adult Health

## 2021-09-03 ENCOUNTER — Inpatient Hospital Stay (HOSPITAL_BASED_OUTPATIENT_CLINIC_OR_DEPARTMENT_OTHER): Payer: Medicaid Other | Admitting: Adult Health

## 2021-09-03 ENCOUNTER — Inpatient Hospital Stay: Payer: Medicaid Other

## 2021-09-03 ENCOUNTER — Other Ambulatory Visit: Payer: Self-pay

## 2021-09-03 ENCOUNTER — Encounter: Payer: Self-pay | Admitting: Hematology and Oncology

## 2021-09-03 ENCOUNTER — Telehealth: Payer: Self-pay | Admitting: *Deleted

## 2021-09-03 ENCOUNTER — Encounter: Payer: Self-pay | Admitting: Pharmacist

## 2021-09-03 VITALS — BP 128/71 | HR 96 | Temp 97.7°F | Resp 16 | Ht 61.0 in | Wt 134.5 lb

## 2021-09-03 DIAGNOSIS — C7951 Secondary malignant neoplasm of bone: Secondary | ICD-10-CM

## 2021-09-03 DIAGNOSIS — C5701 Malignant neoplasm of right fallopian tube: Secondary | ICD-10-CM

## 2021-09-03 DIAGNOSIS — C7949 Secondary malignant neoplasm of other parts of nervous system: Secondary | ICD-10-CM

## 2021-09-03 DIAGNOSIS — C50512 Malignant neoplasm of lower-outer quadrant of left female breast: Secondary | ICD-10-CM

## 2021-09-03 DIAGNOSIS — Z7189 Other specified counseling: Secondary | ICD-10-CM

## 2021-09-03 DIAGNOSIS — Z5112 Encounter for antineoplastic immunotherapy: Secondary | ICD-10-CM | POA: Diagnosis not present

## 2021-09-03 DIAGNOSIS — Z95828 Presence of other vascular implants and grafts: Secondary | ICD-10-CM

## 2021-09-03 DIAGNOSIS — C770 Secondary and unspecified malignant neoplasm of lymph nodes of head, face and neck: Secondary | ICD-10-CM | POA: Diagnosis not present

## 2021-09-03 DIAGNOSIS — Z171 Estrogen receptor negative status [ER-]: Secondary | ICD-10-CM | POA: Diagnosis not present

## 2021-09-03 LAB — CMP (CANCER CENTER ONLY)
ALT: 126 U/L — ABNORMAL HIGH (ref 0–44)
AST: 31 U/L (ref 15–41)
Albumin: 3.8 g/dL (ref 3.5–5.0)
Alkaline Phosphatase: 131 U/L — ABNORMAL HIGH (ref 38–126)
Anion gap: 8 (ref 5–15)
BUN: 10 mg/dL (ref 6–20)
CO2: 28 mmol/L (ref 22–32)
Calcium: 9 mg/dL (ref 8.9–10.3)
Chloride: 104 mmol/L (ref 98–111)
Creatinine: 0.69 mg/dL (ref 0.44–1.00)
GFR, Estimated: >60 mL/min (ref >60)
Glucose, Bld: 113 mg/dL — ABNORMAL HIGH (ref 70–99)
Potassium: 3.6 mmol/L (ref 3.5–5.1)
Sodium: 140 mmol/L (ref 135–145)
Total Bilirubin: 0.6 mg/dL (ref 0.3–1.2)
Total Protein: 7.3 g/dL (ref 6.5–8.1)

## 2021-09-03 LAB — CBC WITH DIFFERENTIAL (CANCER CENTER ONLY)
Abs Immature Granulocytes: 0.06 10*3/uL (ref 0.00–0.07)
Basophils Absolute: 0 10*3/uL (ref 0.0–0.1)
Basophils Relative: 0 %
Eosinophils Absolute: 0 10*3/uL (ref 0.0–0.5)
Eosinophils Relative: 0 %
HCT: 28.2 % — ABNORMAL LOW (ref 36.0–46.0)
Hemoglobin: 9.2 g/dL — ABNORMAL LOW (ref 12.0–15.0)
Immature Granulocytes: 1 %
Lymphocytes Relative: 10 %
Lymphs Abs: 0.7 10*3/uL (ref 0.7–4.0)
MCH: 34.5 pg — ABNORMAL HIGH (ref 26.0–34.0)
MCHC: 32.6 g/dL (ref 30.0–36.0)
MCV: 105.6 fL — ABNORMAL HIGH (ref 80.0–100.0)
Monocytes Absolute: 0.7 10*3/uL (ref 0.1–1.0)
Monocytes Relative: 11 %
Neutro Abs: 5.1 10*3/uL (ref 1.7–7.7)
Neutrophils Relative %: 78 %
Platelet Count: 151 10*3/uL (ref 150–400)
RBC: 2.67 MIL/uL — ABNORMAL LOW (ref 3.87–5.11)
RDW: 15.1 % (ref 11.5–15.5)
WBC Count: 6.6 10*3/uL (ref 4.0–10.5)
nRBC: 0 % (ref 0.0–0.2)

## 2021-09-03 MED ORDER — ACETAMINOPHEN 325 MG PO TABS
650.0000 mg | ORAL_TABLET | Freq: Once | ORAL | Status: AC
  Administered 2021-09-03: 10:00:00 650 mg via ORAL
  Filled 2021-09-03: qty 2

## 2021-09-03 MED ORDER — SODIUM CHLORIDE 0.9% FLUSH
10.0000 mL | INTRAVENOUS | Status: DC | PRN
  Administered 2021-09-03: 09:00:00 10 mL

## 2021-09-03 MED ORDER — SODIUM CHLORIDE 0.9% FLUSH
10.0000 mL | INTRAVENOUS | Status: DC | PRN
  Administered 2021-09-03: 11:00:00 10 mL

## 2021-09-03 MED ORDER — SODIUM CHLORIDE 0.9 % IV SOLN: Freq: Once | INTRAVENOUS | Status: AC

## 2021-09-03 MED ORDER — DIPHENHYDRAMINE HCL 25 MG PO CAPS
50.0000 mg | ORAL_CAPSULE | Freq: Once | ORAL | Status: AC
  Administered 2021-09-03: 10:00:00 50 mg via ORAL
  Filled 2021-09-03: qty 2

## 2021-09-03 MED ORDER — TRASTUZUMAB-DKST CHEMO 150 MG IV SOLR
6.0000 mg/kg | Freq: Once | INTRAVENOUS | Status: AC
  Administered 2021-09-03: 11:00:00 399 mg via INTRAVENOUS
  Filled 2021-09-03: qty 19

## 2021-09-03 MED ORDER — HEPARIN SOD (PORK) LOCK FLUSH 100 UNIT/ML IV SOLN
500.0000 [IU] | Freq: Once | INTRAVENOUS | Status: AC | PRN
  Administered 2021-09-03: 11:00:00 500 [IU]

## 2021-09-03 NOTE — Addendum Note (Signed)
Addended by: Scot Dock on: 09/03/2021 10:22 AM   Modules accepted: Orders

## 2021-09-03 NOTE — Patient Instructions (Signed)
[facility]  Discharge Instructions: Thank you for choosing [facility] to provide your oncology and hematology care.   If you have a lab appointment with the [facility], please go directly to the [facility] and check in at the registration area.   Wear comfortable clothing and clothing appropriate for easy access to any Portacath or PICC line.   We strive to give you quality time with your provider. You may need to reschedule your appointment if you arrive late (15 or more minutes).  Arriving late affects you and other patients whose appointments are after yours.  Also, if you miss three or more appointments without notifying the office, you may be dismissed from the clinic at the providers discretion.      For prescription refill requests, have your pharmacy contact our office and allow 72 hours for refills to be completed.    Today you received the following chemotherapy and/or immunotherapy agent: Trastuzumab (Ogivri)   To help prevent nausea and vomiting after your treatment, we encourage you to take your nausea medication as directed.  BELOW ARE SYMPTOMS THAT SHOULD BE REPORTED IMMEDIATELY: *FEVER GREATER THAN 100.4 F (38 C) OR HIGHER *CHILLS OR SWEATING *NAUSEA AND VOMITING THAT IS NOT CONTROLLED WITH YOUR NAUSEA MEDICATION *UNUSUAL SHORTNESS OF BREATH *UNUSUAL BRUISING OR BLEEDING *URINARY PROBLEMS (pain or burning when urinating, or frequent urination) *BOWEL PROBLEMS (unusual diarrhea, constipation, pain near the anus) TENDERNESS IN MOUTH AND THROAT WITH OR WITHOUT PRESENCE OF ULCERS (sore throat, sores in mouth, or a toothache) UNUSUAL RASH, SWELLING OR PAIN  UNUSUAL VAGINAL DISCHARGE OR ITCHING   Items with * indicate a potential emergency and should be followed up as soon as possible or go to the Emergency Department if any problems should occur.  Please show the CHEMOTHERAPY ALERT CARD or IMMUNOTHERAPY ALERT CARD at  check-in to the Emergency Department and triage nurse.  Should you have questions after your visit or need to cancel or reschedule your appointment, please contact [facility]  Dept: [phone]  and follow the prompts.  Office hours are 8:00 a.m. to 4:30 p.m. Monday - Friday. Please note that voicemails left after 4:00 p.m. may not be returned until the following business day.  We are closed weekends and major holidays. You have access to a nurse at all times for urgent questions. Please call the main number to the clinic Dept: [phone] and follow the prompts.   For any non-urgent questions, you may also contact your provider using MyChart. We now offer e-Visits for anyone 22 and older to request care online for non-urgent symptoms. For details visit [email or web address].   Also download the MyChart app! Go to the app store, search "MyChart", open the app, select [facility], and log in with your MyChart username and password.  Due to Covid, a mask is required upon entering the hospital/clinic. If you do not have a mask, one will be given to you upon arrival. For doctor visits, patients may have 1 support person aged 49 or older with them. For treatment visits, patients cannot have anyone with them due to current Covid guidelines and our immunocompromised population.   Trastuzumab injection for infusion What is this medication? TRASTUZUMAB (tras TOO zoo mab) is a monoclonal antibody. It is used to treat breast cancer and stomach cancer. This medicine may be used for other purposes; ask your health care provider or pharmacist if you have questions. COMMON BRAND NAME(S): Herceptin, Herzuma,  Ronnie Doss What should I tell my care team before I take this medication? They need to know if you have any of these conditions: heart disease heart failure lung or breathing disease, like asthma an unusual or allergic reaction to trastuzumab,  benzyl alcohol, or other medications, foods, dyes, or preservatives pregnant or trying to get pregnant breast-feeding How should I use this medication? This drug is given as an infusion into a vein. It is administered in a hospital or clinic by a specially trained health care professional. Talk to your pediatrician regarding the use of this medicine in children. This medicine is not approved for use in children. Overdosage: If you think you have taken too much of this medicine contact a poison control center or emergency room at once. NOTE: This medicine is only for you. Do not share this medicine with others. What if I miss a dose? It is important not to miss a dose. Call your doctor or health care professional if you are unable to keep an appointment. What may interact with this medication? This medicine may interact with the following medications: certain types of chemotherapy, such as daunorubicin, doxorubicin, epirubicin, and idarubicin This list may not describe all possible interactions. Give your health care provider a list of all the medicines, herbs, non-prescription drugs, or dietary supplements you use. Also tell them if you smoke, drink alcohol, or use illegal drugs. Some items may interact with your medicine. What should I watch for while using this medication? Visit your doctor for checks on your progress. Report any side effects. Continue your course of treatment even though you feel ill unless your doctor tells you to stop. Call your doctor or health care professional for advice if you get a fever, chills or sore throat, or other symptoms of a cold or flu. Do not treat yourself. Try to avoid being around people who are sick. You may experience fever, chills and shaking during your first infusion. These effects are usually mild and can be treated with other medicines. Report any side effects during the infusion to your health care professional. Fever and chills usually do not happen  with later infusions. Do not become pregnant while taking this medicine or for 7 months after stopping it. Women should inform their doctor if they wish to become pregnant or think they might be pregnant. Women of child-bearing potential will need to have a negative pregnancy test before starting this medicine. There is a potential for serious side effects to an unborn child. Talk to your health care professional or pharmacist for more information. Do not breast-feed an infant while taking this medicine or for 7 months after stopping it. Women must use effective birth control with this medicine. What side effects may I notice from receiving this medication? Side effects that you should report to your doctor or health care professional as soon as possible: allergic reactions like skin rash, itching or hives, swelling of the face, lips, or tongue chest pain or palpitations cough dizziness feeling faint or lightheaded, falls fever general ill feeling or flu-like symptoms signs of worsening heart failure like breathing problems; swelling in your legs and feet unusually weak or tired Side effects that usually do not require medical attention (report to your doctor or health care professional if they continue or are bothersome): bone pain changes in taste diarrhea joint pain nausea/vomiting weight loss This list may not describe all possible side effects. Call your doctor for medical advice about side effects. You may  report side effects to FDA at 1-800-FDA-1088. Where should I keep my medication? This drug is given in a hospital or clinic and will not be stored at home. NOTE: This sheet is a summary. It may not cover all possible information. If you have questions about this medicine, talk to your doctor, pharmacist, or health care provider.  2022 Elsevier/Gold Standard (2016-09-15 00:00:00)  Thrombocytopenia Thrombocytopenia means that you have a low number of platelets in your blood.  Platelets are tiny cells in the blood. When you bleed, they clump together at the cut or injury to stop the bleeding. This is called blood clotting. If you do not have enough platelets, your blood may have trouble clotting. This may cause you to bleed and bruise very easily. What are the causes? This condition is caused by a low number of platelets in your blood. There are three main reasons for this: Your body not making enough platelets. This may be caused by: Bone marrow diseases. Disorders that are passed from parent to child (inherited). Certain cancer medicines or treatments. Infection from germs (bacteria or viruses). Alcoholism. Platelets not being released in the blood. This can be caused by: Having a spleen that is larger than normal. A condition called Gaucher disease. Your body destroying platelets too quickly. This may be caused by: Certain autoimmune diseases. Some medicines that thin your blood. Certain blood clotting disorders. Certain bleeding disorders. Exposure to harmful (toxic) chemicals. Pregnancy. What are the signs or symptoms? Bruising easily. Bleeding from the nose or mouth. Heavy menstrual periods. Blood in the pee (urine), poop (stool), or vomit. A purple-red color to the skin (purpura). A rash that looks like pinpoint, purple-red spots (petechiae) on the lower legs. How is this treated? Treatment depends on the cause. Treatment may include: Treatment of another condition that is causing the low platelet count. Medicines to help protect your platelets from being destroyed. A replacement (transfusion) of platelets to stop or prevent bleeding. Surgery to take out the spleen. Follow these instructions at home: Medicines Take over-the-counter and prescription medicines only as told by your doctor. Do not take any medicines that have aspirin or NSAIDs, such as ibuprofen. Activity Avoid doing things that could hurt or bruise you. Take action to prevent  falls. Do not play contact sports. Ask your doctor what activities are safe for you. Take care not to burn yourself: When you use an iron. When you cook. Take care not to cut yourself: When you shave. When you use scissors, needles, knives, or other tools. General instructions  Check your skin and the inside of your mouth for bruises or blood as told by your doctor. Wear a medical alert bracelet that says that you have a bleeding disorder. Check to see if there is blood in your pee and poop. Do this as told by your doctor. Do not drink alcohol. If you do drink, limit the amount that you drink. Stay away from harmful (toxic) chemicals. Tell all of your doctors that you have this condition. Be sure to tell your dentist and eye doctor. Tell your dentist about your condition before you have your teeth cleaned. Keep all follow-up visits. Contact a doctor if: You have bruises and you do not know why. You have new symptoms. You have symptoms that get worse. You have a fever. Get help right away if: You have very bad bleeding anywhere on your body. You have blood in your vomit, pee, or poop. You have an injury to your head. You have  a sudden, very bad headache. Summary Thrombocytopenia means that you have a low number of platelets in your blood. Platelets stick together to form a clot. Symptoms of this condition include getting bruises easily, bleeding from the mouth and nose, a purple-red color to the skin, and a rash. Take care not to cut or burn yourself. This information is not intended to replace advice given to you by your health care provider. Make sure you discuss any questions you have with your health care provider. Document Revised: 02/13/2021 Document Reviewed: 02/13/2021 Elsevier Patient Education  [place].

## 2021-09-03 NOTE — Progress Notes (Signed)
Per Dr. [name], ok to proceed with elevated ALT.

## 2021-09-03 NOTE — Assessment & Plan Note (Addendum)
Left lumpectomy: IDC grade 3, 1.2 cm, DCIS is present, margins negative, 0/6 lymph nodes negative, ER 0%, PR 0%, HER-2 positive ratio 2.28, Ki-67 20%, T1 BN 0 stage IA S/P Taxol-Herceptin BRCA 2 Mutation Positive  10/20/19:Bone mets involving Right hip, Bil Iliac bones, T8, T9, L2 and L3, Rt prox humerus, Righ glenoid. Some lytic and some sclerotic. Mild Left supraclav adenopathy, 2 small lesions basal ganglia (could be lacunar infarcts)  Biopsy the supra clav LN: 10/31/2019: Metastatic breast cancer, ER/PR negative HER-2 equivocal by IHC, FISHpositive Treatmentsummary: Kadcyla every 3-week treatment.Discontinued for progression  MRI of the back 01/07/2020: Extensive metastatic disease to the involved spine. Tumor replacement of L3 vertebral body with pathologic compression fracture, epidural and extraosseous extension. Tumor extending throughout L3 foramen Status post palliative radiation therapyCausing severe dysphagia and dehydration and hospitalization5/17/2021-02/03/2020 Talazoparib started 02/07/2020 CT CAP 09/20/19: No evidence of ST mets, tiny inferior right hepatic lesion substantially decreased(3 mm), bone mets have become sclerotic suggest healing CT CAP 12/26/2020:Stable diffuse bone mets CT CAP 07/01/2021: Stable bone metastases. New liver lesion 1.5 cm Brain MRI 07/12/2021: Abnormal cerebellar signal suspicious for leptomeningeal spread. Lumbar puncture 07/15/2021: Highly suspicious for leptomeningeal carcinomatosis 08/12/2021 through 08/25/2021 whole brain radiation therapy was completed Tucatinib plus Xeloda plus Herceptin based on HER-2 Climb study beginning 12 1, 2022. Liver ablation was completed on August 27, 2021 ------------------------------------------------------------------------------------------------------------------------------------------------------ Echocardiogram  Current treatment:Tucatinib, capecitabine, trastuzumab Toxicities: Occasional mild  nausea Fatigue Anemia: Hemoglobin is 9.2.  We are monitoring.  She did have a slight elevation in her LFTs.  This is likely due to the liver ablation that she underwent on August 27, 2021.  I reviewed this with Dr. [name] we will continue to monitor.  She is doing well today.  She continues on to catnap.  She will resume Sided pain on Sunday, September 07, 2021.  Due to her confusion with when to start and stop her capecitabine I have asked our clinical pharmacy practitioner John to help with this.

## 2021-09-03 NOTE — Telephone Encounter (Signed)
Per Annabelle Harman, will hold Xgeva for pt until next visit due to elevated liver function

## 2021-09-03 NOTE — Progress Notes (Signed)
Erin [name]       Telephone: [phone]?Fax: [phone]   Oncology Clinical Pharmacist Practitioner Progress Note  Erin [name] is a 60 y.o. female with a diagnosis of metastatic breast cancer currently on tucatinib/capecitabine/trastuzumab under the care of [name].   Clinical pharmacy was asked by Wilber Bihari NP today to make a calendar for patient related to her tucatinib/capecitabine prescriptions.  [name] saw patient today and discussed restarting tucatinib/capecitabine on 09/07/21 and wanted a calendar to reflect this cycle of treatment.    Calendar provided for December and January to [name] per her request.  She will review and distribute to patient.  [name] will see Dr. [name] next on 09/24/21 and clinical pharmacy on 11/05/21.  Clinical pharmacy will continue to support [name] and [name] as needed.  Raina Mina, RPH-CPP,  09/03/2021  10:39 AM

## 2021-09-03 NOTE — Progress Notes (Signed)
[facility] Follow up:    Erin Pounds, NP [place] [address]   DIAGNOSIS:  Cancer Staging  Fallopian tube cancer, carcinoma, right [facility]) Staging form: Ovary, Fallopian Tube, and Primary Peritoneal Carcinoma, AJCC 8th Edition - Clinical stage from 11/04/2017: FIGO Stage I, calculated as Stage Unknown (cT1c2, cNX, cM0) - Signed by Erin Amber, MD on 11/22/2017 Histopathologic type: Serous cystadenocarcinoma, NOS Histologic grade (G): G3 Histologic grading system: 4 grade system Laterality: Right Tumor size (mm): 6 Lymph-vascular invasion (LVI): Presence of LVI unknown/indeterminate Diagnostic confirmation: Positive histology Specimen type: Excision Staged by: Pathologist Stage used in treatment planning: Yes National guidelines used in treatment planning: Yes Type of national guideline used in treatment planning: NCCN  Malignant neoplasm of lower-outer quadrant of left breast of female, estrogen receptor negative ([place]) Staging form: Breast, AJCC 8th Edition - Clinical stage from 05/18/2017: Stage IA (cT1b, cN0, cM0, G3, ER-, PR-, HER2+) - Unsigned Method of lymph node assessment: Clinical Histologic grading system: 3 grade system Laterality: Left Tumor size (mm): 10 - Pathologic: Stage IA (pT1c, pN0, cM0, G3, ER-, PR-, HER2+) - Signed by Erin Phlegm, NP on 07/07/2017 Histologic grading system: 3 grade system   SUMMARY OF ONCOLOGIC HISTORY: Oncology History  Malignant neoplasm of lower-outer quadrant of left breast of female, estrogen receptor negative ([place])  05/11/2017 Initial Diagnosis   Left breast biopsy 3:30 position 6 cm from nipple: IDC with DCIS, lymphovascular invasion present, grade 2-3, ER 0%, PR 0%, HER-2 positive ratio 2.28, Ki-67 20%, 1 cm lesion left breast, T1b N0 stage IA clinical stage   06/09/2017 Surgery   Left lumpectomy: IDC grade 3, 1.2 cm, DCIS is present, margins negative, 0/6 lymph nodes negative,  ER 0%, PR 0%, HER-2 positive ratio 2.28, Ki-67 20%, T1 BN 0 stage IA   07/09/2017 - 09/29/2017 Adjuvant Chemotherapy   Taxol/Herceptin x 12 completed on 09/29/2017, went on to receive every 3 week adjuvant Herceptin through 06/28/2018   11/25/2017 - 01/07/2018 Radiation Therapy    The patient initially received a dose of 50.4 Gy in 28 fractions to the breast using whole-breast tangent fields. This was delivered using a 3-D conformal technique. The patient then received a boost to the seroma. This delivered an additional 10 Gy in 5 fractions using a 3-D technique. The total dose was 60.4 Gy.   02/15/2018 Genetic Testing   The Common Hereditary Cancer Panel offered by [facility] includes sequencing and/or deletion duplication testing of the following 47 genes: APC, ATM, AXIN2, BARD1, BMPR1A, BRCA1, BRCA2, BRIP1, CDH1, CDKN2A (p14ARF), CDKN2A (p16INK4a), CKD4, CHEK2, CTNNA1, DICER1, EPCAM (Deletion/duplication testing only), GREM1 (promoter region deletion/duplication testing only), KIT, MEN1, MLH1, MSH2, MSH3, MSH6, MUTYH, NBN, NF1, NHTL1, PALB2, PDGFRA, PMS2, POLD1, POLE, PTEN, RAD50, RAD51C, RAD51D, SDHB, SDHC, SDHD, SMAD4, SMARCA4. STK11, TP53, TSC1, TSC2, and VHL.  The following genes were evaluated for sequence changes only: SDHA and HOXB13 c.251G>A variant only.   Results: POSITIVE. 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 The date of this test report is 02/15/2018   10/20/2019 PET scan   Bone mets involving Right hip, Bil Iliac bones, T8, T9, L2 and L3, Rt prox humerus, Righ glenoid. Some lytic and some sclerotic. Mild Left supraclav adenopathy, 2 small lesions basal ganglia (could be lacunar infarcts)   11/14/2019 - 12/26/2019 Chemotherapy   The patient had palonosetron (ALOXI) injection 0.25 mg, 0.25 mg, Intravenous,  Once, 2 of 5 cycles Administration: 0.25 mg (12/05/2019), 0.25 mg (12/26/2019) ado-trastuzumab  emtansine (KADCYLA) 260 mg in sodium chloride 0.9 % 250 mL chemo  infusion, 3.6 mg/kg = 260 mg, Intravenous, Once, 3 of 6 cycles Administration: 260 mg (11/14/2019), 260 mg (12/05/2019), 260 mg (12/26/2019)   for chemotherapy treatment.     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  Required TPN briefly.  Patient was able to swallow and keep food down by the time of discharge.   02/06/2020 Miscellaneous   Talazoparib   07/15/2021 - 07/15/2021 Chemotherapy   Patient is on Treatment Plan : Intrathecal Methotrexate     08/14/2021 -  Chemotherapy   Patient is on Treatment Plan : BREAST Capecitabine + Trastuzumab + Tucatinib q21d     Fallopian tube cancer, carcinoma, right ([place])  11/04/2017 Surgery   Uterus, cervix and bilateral fallopian tubes: Fallopian tube with serous carcinoma 0.6 cm positive for p53, PAX8, WT-1, MOC-31, cytokeratin 7, and estrogen receptor   01/11/2018 -  Chemotherapy   Taxol and carboplatin every 3 weeks (Taxol discontinued with cycle 2 because of profound rash)   10/20/2019 PET scan   Bone mets involving Right hip, Bil Iliac bones, T8, T9, L2 and L3, Rt prox humerus, Righ glenoid. Some lytic and some sclerotic. Mild Left supraclav adenopathy, 2 small lesions basal ganglia (could be lacunar infarcts)   11/14/2019 - 12/26/2019 Chemotherapy   The patient had palonosetron (ALOXI) injection 0.25 mg, 0.25 mg, Intravenous,  Once, 2 of 5 cycles Administration: 0.25 mg (12/05/2019), 0.25 mg (12/26/2019) ado-trastuzumab emtansine (KADCYLA) 260 mg in sodium chloride 0.9 % 250 mL chemo infusion, 3.6 mg/kg = 260 mg, Intravenous, Once, 3 of 6 cycles Administration: 260 mg (11/14/2019), 260 mg (12/05/2019), 260 mg (12/26/2019)   for chemotherapy treatment.     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 female returns for evaluation prior tor receiving her Trastuzumab infusion.  She is accomapnied by her mother and a sign language interpreter.  She continues on Trastuzumab every 3 weeks.  She continues on Tucatinib $RemoveBefo'300mg'ySAFSYTeLAr$  PO BID.  She continues on Capecitabine but struggles with remembering her weeks on and off of the medication.  She underwent CT guided liver ablation to her right liver lobe metastatic lesion and stopped the Capecitabine prior to then.  She will start back the capecitabine on Sunday, 09/07/2021.     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[place]) 11/03/2019   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[place]) 11/03/2019   Elevated  CA-125 10/09/2019   Paresthesia 04/17/2019   BRCA2 gene mutation positive 02/17/2018   Genetic testing 02/17/2018   Family history of breast cancer    Family history of colon cancer    Fallopian tube cancer, carcinoma, right ([place]) 11/22/2017   S/P vaginal hysterectomy 11/04/2017   Ovarian mass, left 08/26/2017   Menorrhagia 08/26/2017   Port-A-Cath in place 07/23/2017   Malignant neoplasm of  lower-outer quadrant of left breast of female, estrogen receptor negative ([place]) 05/13/2017   Abdominal bloating 04/02/2017   Chronic right-sided low back pain without sciatica 07/02/2016   DM type 2 (diabetes mellitus, type 2) ([place]) 11/18/2014   Intermittent constipation 06/06/2010   UNSPECIFIED IRON DEFICIENCY ANEMIA 04/22/2010   DEPRESSION 04/10/2010   Hearing loss 04/10/2010    has No Known Allergies.  MEDICAL HISTORY: Past Medical History:  Diagnosis Date   Arthritis    knees, elbows   Borderline glaucoma of right eye    BRCA2 gene mutation positive 02/17/2018   BRCA2 c.4552del (p.Glu1518Asnfs*25)  Result reported out on 02/15/2018.    Breast cancer ([place]) 05/2017   left   Chronic back pain    Deaf    per pt born hearing and at age 75 1/2 lost hearing , was told by mother unknown cause, can miminally hear in left and no hearing on right    Depression    Elevated cancer antigen 125 (CA 125)    Family history of breast cancer    Family history of breast cancer    Family history of colon cancer    Genetic testing 02/17/2018   The Common Hereditary Cancer Panel offered by [facility] includes sequencing and/or deletion duplication testing of the following 47 genes: APC, ATM, AXIN2, BARD1, BMPR1A, BRCA1, BRCA2, BRIP1, CDH1, CDKN2A (p14ARF), CDKN2A (p16INK4a), CKD4, CHEK2, CTNNA1, DICER1, EPCAM (Deletion/duplication testing only), GREM1 (promoter region deletion/duplication testing only), KIT, MEN1, MLH1, MSH2, MSH3, MSH6, MU   GERD (gastroesophageal reflux disease)    History of cancer chemotherapy    left breast cancer 09-08-2017 to 10-09-2017;  fallopion tube cancer  12-15-2017  to 06-28-2018   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 oncologist-  dr [name]   11-04-2017  s/p  LAVH w/ BSO,  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;  completed chemo 06-28-2018   History of external beam radiation therapy    left breast  11-25-2017  to  01-07-2018   Hyperlipidemia    Hypertension    followed by pcp   (10-05-2019  per pt never had stress test)   IDA (iron deficiency anemia)    Malignant neoplasm of lower-outer quadrant of left breast of female, estrogen receptor negative [facility]) oncologist-- dr [name]   dx 08/ 2018--- Stage IA, DCIS,  ER/ PR negative,  HER-2 positive;  06-09-2017 s/p left breast lumpectomy with node dissection;  completed chemo 10-09-2017  and radiation 01-07-2018/  hercepton completed 06-28-2018   Non-insulin dependent type 2 diabetes mellitus ([place])    followed by pcp   (10-05-2019 per pt check cbg every other day in AM,  fasting cbg-- 105)   Numbness of right thumb    Wears glasses     SURGICAL HISTORY: Past Surgical History:  Procedure Laterality Date   BREAST LUMPECTOMY WITH RADIOACTIVE SEED AND SENTINEL LYMPH NODE BIOPSY Left 06/09/2017   Procedure: BREAST LUMPECTOMY WITH RADIOACTIVE SEED AND SENTINEL LYMPH NODE BIOPSY;  Surgeon: Excell Seltzer, MD;  Location: [facility];  Service: General;  Laterality: Left;   COLONOSCOPY  11/02/2017   polyps   HERNIA REPAIR     HIATAL HERNIA REPAIR  09/2019   IR RADIOLOGIST EVAL & MGMT  07/09/2021   IR RADIOLOGIST EVAL & MGMT  07/24/2021   LAPAROSCOPIC ASSISTED VAGINAL HYSTERECTOMY N/A 11/04/2017   Procedure: LAPAROSCOPIC ASSISTED VAGINAL HYSTERECTOMY;  Surgeon: [name], MD;  Location: [facility];  Service: Gynecology;  Laterality: N/A;   LAPAROSCOPIC BILATERAL SALPINGO OOPHERECTOMY Bilateral 11/04/2017   Procedure: LAPAROSCOPIC BILATERAL SALPINGO OOPHORECTOMY;  Surgeon: [name], MD;  Location: [facility];  Service: Gynecology;  Laterality: Bilateral;   LAPAROSCOPY N/A 10/09/2019   Procedure: LAPAROSCOPY DIAGNOSTIC WITH PERITONEAL BIOPSIES;  Surgeon: Erin Amber, MD;  Location: [facility];  Service: Gynecology;  Laterality: N/A;   PORTACATH PLACEMENT Right 06/09/2017   Procedure: INSERTION PORT-A-CATH WITH [place];  Surgeon:  Excell Seltzer, MD;  Location: [facility];  Service: General;  Laterality: Right;   PORTACATH PLACEMENT Right 11/13/2019   Procedure: INSERTION PORT-A-CATH WITH ULTRASOUND GUIDANCE;  Surgeon: Rolm Bookbinder, MD;  Location: [facility];  Service: General;  Laterality: Right;   RADIOLOGY WITH ANESTHESIA N/A 08/27/2021   Procedure: IR WITH ANESTHESIA MICROWAVE ABLATION OF LIVER;  Surgeon: Aletta Edouard, MD;  Location: [facility];  Service: Radiology;  Laterality: N/A;   TUBAL LIGATION  02/02/2002   '@[facility]'$    PPTL   UPPER GI ENDOSCOPY      SOCIAL HISTORY: Social History   Socioeconomic History   Marital status: Legally Separated    Spouse name: Not on file   Number of children: 2   Years of education: Not on file   Highest education level: Not on file  Occupational History   Not on file  Tobacco Use   Smoking status: Never   Smokeless tobacco: Never  Vaping Use   Vaping Use: Never used  Substance and Sexual Activity   Alcohol use: Not Currently    Comment: very rare   Drug use: Never   Sexual activity: Not Currently    Birth control/protection: Surgical  Other Topics Concern   Not on file  Social History Narrative   1 boy and 1 girl   Resides in [place]   Seperated   Social Determinants of Health   Financial Resource Strain: Not on file  Food Insecurity: Not on file  Transportation Needs: Not on file  Physical Activity: Not on file  Stress: Not on file  Social Connections: Not on file  Intimate Partner Violence: Not on file    FAMILY HISTORY: Family History  Problem Relation Age of Onset   Hypertension Mother    Lupus Mother    Diabetes Father    Hypertension Sister    Diabetes Paternal Grandmother    Colon cancer Paternal Grandmother 95   CAD Brother    Diabetes Brother    Heart attack Brother    Breast cancer Maternal Aunt        dx >50   Heart attack Paternal Grandfather    Breast cancer Maternal Aunt        dx under 21    Breast cancer Maternal Aunt        dx  under 50    Review of Systems  Constitutional:  Negative for appetite change, chills, fatigue, fever and unexpected weight change.  HENT:   Negative for hearing loss, lump/mass and trouble swallowing.   Eyes:  Negative for eye problems and icterus.  Respiratory:  Negative for  chest tightness, cough and shortness of breath.   Cardiovascular:  Negative for chest pain, leg swelling and palpitations.  Gastrointestinal:  Negative for abdominal distention, abdominal pain, constipation, diarrhea, nausea and vomiting.  Endocrine: Negative for hot flashes.  Genitourinary:  Negative for difficulty urinating.   Musculoskeletal:  Negative for arthralgias.  Skin:  Negative for itching and rash.  Neurological:  Negative for dizziness, extremity weakness, headaches and numbness.  Hematological:  Negative for adenopathy. Does not bruise/bleed easily.  Psychiatric/Behavioral:  Negative for depression. The patient is not nervous/anxious.      PHYSICAL EXAMINATION  ECOG PERFORMANCE STATUS: 1 - Symptomatic but completely ambulatory  Vitals:   09/03/21 0851  BP: 128/71  Pulse: 96  Resp: 16  Temp: 97.7 F (36.5 C)  SpO2: 100%    Physical Exam Constitutional:      General: She is not in acute distress.    Appearance: Normal appearance. She is not toxic-appearing.  HENT:     Head: Normocephalic and atraumatic.  Eyes:     General: No scleral icterus. Cardiovascular:     Rate and Rhythm: Normal rate and regular rhythm.     Pulses: Normal pulses.     Heart sounds: Normal heart sounds.  Pulmonary:     Effort: Pulmonary effort is normal.     Breath sounds: Normal breath sounds.  Abdominal:     General: Abdomen is flat. Bowel sounds are normal. There is no distension.     Palpations: Abdomen is soft.     Tenderness: There is no abdominal tenderness.  Musculoskeletal:        General: No swelling.     Cervical back: Neck supple.  Lymphadenopathy:      Cervical: No cervical adenopathy.  Skin:    General: Skin is warm and dry.     Findings: No rash.  Neurological:     General: No focal deficit present.     Mental Status: She is alert.  Psychiatric:        Mood and Affect: Mood normal.        Behavior: Behavior normal.    LABORATORY DATA:  CBC    Component Value Date/Time   WBC 6.6 09/03/2021 0841   WBC 5.8 08/27/2021 0728   RBC 2.67 (L) 09/03/2021 0841   HGB 9.2 (L) 09/03/2021 0841   HGB 10.9 (L) 04/04/2021 0920   HGB 9.3 (L) 09/15/2017 0800   HCT 28.2 (L) 09/03/2021 0841   HCT 31.5 (L) 04/04/2021 0920   HCT 28.2 (L) 09/15/2017 0800   PLT 151 09/03/2021 0841   PLT 65 (LL) 04/04/2021 0920   MCV 105.6 (H) 09/03/2021 0841   MCV 102 (H) 04/04/2021 0920   MCV 82.4 09/15/2017 0800   MCH 34.5 (H) 09/03/2021 0841   MCHC 32.6 09/03/2021 0841   RDW 15.1 09/03/2021 0841   RDW 14.6 04/04/2021 0920   RDW 20.8 (H) 09/15/2017 0800   LYMPHSABS 0.7 09/03/2021 0841   LYMPHSABS 1.2 04/04/2021 0920   LYMPHSABS 2.5 09/15/2017 0800   MONOABS 0.7 09/03/2021 0841   MONOABS 0.8 09/15/2017 0800   EOSABS 0.0 09/03/2021 0841   EOSABS 0.0 04/04/2021 0920   BASOSABS 0.0 09/03/2021 0841   BASOSABS 0.0 04/04/2021 0920   BASOSABS 0.0 09/15/2017 0800    CMP     Component Value Date/Time   NA 140 09/03/2021 0841   NA 141 04/04/2021 0920   NA 139 09/15/2017 0800   K 3.6 09/03/2021 0841   K 3.8 09/15/2017 0800  CL 104 09/03/2021 0841   CO2 28 09/03/2021 0841   CO2 23 09/15/2017 0800   GLUCOSE 113 (H) 09/03/2021 0841   GLUCOSE 113 09/15/2017 0800   BUN 10 09/03/2021 0841   BUN 11 04/04/2021 0920   BUN 8.7 09/15/2017 0800   CREATININE 0.69 09/03/2021 0841   CREATININE 0.7 09/15/2017 0800   CALCIUM 9.0 09/03/2021 0841   CALCIUM 9.2 09/15/2017 0800   PROT 7.3 09/03/2021 0841   PROT 7.2 04/04/2021 0920   PROT 7.3 09/15/2017 0800   ALBUMIN 3.8 09/03/2021 0841   ALBUMIN 4.2 04/04/2021 0920   ALBUMIN 3.3 (L) 09/15/2017 0800   AST 31  09/03/2021 0841   AST 13 09/15/2017 0800   ALT 126 (H) 09/03/2021 0841   ALT 19 09/15/2017 0800   ALKPHOS 131 (H) 09/03/2021 0841   ALKPHOS 74 09/15/2017 0800   BILITOT 0.6 09/03/2021 0841   BILITOT 0.27 09/15/2017 0800   GFRNONAA >60 09/03/2021 0841   GFRAA >60 06/14/2020 0821     ASSESSMENT and PLAN:   Malignant neoplasm of lower-outer quadrant of left breast of female, estrogen receptor negative ([place]) Left lumpectomy: IDC grade 3, 1.2 cm, DCIS is present, margins negative, 0/6 lymph nodes negative, ER 0%, PR 0%, HER-2 positive ratio 2.28, Ki-67 20%, T1 BN 0 stage IA S/P Taxol-Herceptin BRCA 2 Mutation Positive   10/20/19: Bone mets involving Right hip, Bil Iliac bones, T8, T9, L2 and L3, Rt prox humerus, Righ glenoid. Some lytic and some sclerotic. Mild Left supraclav adenopathy, 2 small lesions basal ganglia (could be lacunar infarcts)   Biopsy the supra clav LN: 10/31/2019: Metastatic breast cancer, ER/PR negative HER-2 equivocal by IHC, FISH positive Treatment summary: Kadcyla every 3-week treatment.  Discontinued for progression   MRI of the back 01/07/2020: Extensive metastatic disease to the involved spine.  Tumor replacement of L3 vertebral body with pathologic compression fracture, epidural and extraosseous extension.  Tumor extending throughout L3 foramen Status post palliative radiation therapy Causing severe dysphagia and dehydration and hospitalization 01/29/2020-02/03/2020 Talazoparib started 02/07/2020  CT CAP 09/20/19: No evidence of ST mets, tiny inferior right hepatic lesion substantially decreased (3 mm), bone mets have become sclerotic suggest healing CT CAP 12/26/2020: Stable diffuse bone mets CT CAP 07/01/2021: Stable bone metastases.  New liver lesion 1.5 cm Brain MRI 07/12/2021: Abnormal cerebellar signal suspicious for leptomeningeal spread. Lumbar puncture 07/15/2021: Highly suspicious for leptomeningeal carcinomatosis  08/12/2021 through 08/25/2021 whole brain  radiation therapy was completed Tucatinib plus Xeloda plus Herceptin based on HER-2 Climb study beginning 12 1, 2022. Liver ablation was completed on August 27, 2021 ------------------------------------------------------------------------------------------------------------------------------------------------------ Echocardiogram  Current treatment: Tucatinib, capecitabine, trastuzumab Toxicities: Occasional mild nausea Fatigue Anemia: Hemoglobin is 9.2.  We are monitoring.  She did have a slight elevation in her LFTs.  This is likely due to the liver ablation that she underwent on August 27, 2021.  I reviewed this with Dr. [name] we will continue to monitor.  She is doing well today.  She continues on to catnap.  She will resume Sided pain on Sunday, September 07, 2021.  Due to her confusion with when to start and stop her capecitabine I have asked our clinical pharmacy practitioner John to help with this.  All questions were answered. The patient knows to call the clinic with any problems, questions or concerns. We can certainly see the patient much sooner if necessary.  Total encounter time: 30 minutes in face-to-face visit time, chart review, lab review, care coordination, order entry, and documentation  of the encounter.   Erin Bihari, NP 09/03/21 9:39 AM Medical Oncology and Hematology [facility] [place], [place] [address] Tel. [phone]    Fax. [phone]  *Total Encounter Time as defined by the Centers for Medicare and Medicaid Services includes, in addition to the face-to-face time of a patient visit (documented in the note above) non-face-to-face time: obtaining and reviewing outside history, ordering and reviewing medications, tests or procedures, care coordination (communications with other health care professionals or caregivers) and documentation in the medical record.

## 2021-09-04 ENCOUNTER — Other Ambulatory Visit (HOSPITAL_COMMUNITY): Payer: Self-pay

## 2021-09-09 ENCOUNTER — Telehealth: Payer: Self-pay | Admitting: *Deleted

## 2021-09-09 NOTE — Telephone Encounter (Signed)
Patient called - spoke with patient via interpreter. Patient states had surgery on 08/27/21. Stated she is still feeling generally weak and tired. No other symptoms to report or concerns stated. She wants to know when she should start feeling stronger.  Advised patient that she should expect to gain a little more strength every day and return to how she felt previously 4-6 weeks from date of surgery. She should remain as active as tolerated daily.  If anything changes or if she does not steadily improve, she needs to contact office. She verbalized understanding and said she had appt with surgeon on 1/17 for f/u.

## 2021-09-16 ENCOUNTER — Telehealth: Payer: Self-pay

## 2021-09-16 ENCOUNTER — Encounter (HOSPITAL_COMMUNITY): Payer: Self-pay | Admitting: *Deleted

## 2021-09-16 ENCOUNTER — Other Ambulatory Visit: Payer: Self-pay

## 2021-09-16 ENCOUNTER — Emergency Department (HOSPITAL_COMMUNITY)
Admission: EM | Admit: 2021-09-16 | Discharge: 2021-09-16 | Disposition: A | Discharge status: Home / Self Care | Payer: Medicare Other | Attending: Emergency Medicine | Admitting: Emergency Medicine

## 2021-09-16 ENCOUNTER — Emergency Department (HOSPITAL_COMMUNITY): Payer: Medicare Other

## 2021-09-16 DIAGNOSIS — I10 Essential (primary) hypertension: Secondary | ICD-10-CM | POA: Diagnosis not present

## 2021-09-16 DIAGNOSIS — Z79899 Other long term (current) drug therapy: Secondary | ICD-10-CM | POA: Insufficient documentation

## 2021-09-16 DIAGNOSIS — Z8544 Personal history of malignant neoplasm of other female genital organs: Secondary | ICD-10-CM | POA: Insufficient documentation

## 2021-09-16 DIAGNOSIS — Z853 Personal history of malignant neoplasm of breast: Secondary | ICD-10-CM | POA: Diagnosis not present

## 2021-09-16 DIAGNOSIS — N9489 Other specified conditions associated with female genital organs and menstrual cycle: Secondary | ICD-10-CM | POA: Insufficient documentation

## 2021-09-16 DIAGNOSIS — K5900 Constipation, unspecified: Secondary | ICD-10-CM | POA: Insufficient documentation

## 2021-09-16 DIAGNOSIS — R112 Nausea with vomiting, unspecified: Secondary | ICD-10-CM | POA: Insufficient documentation

## 2021-09-16 DIAGNOSIS — E119 Type 2 diabetes mellitus without complications: Secondary | ICD-10-CM | POA: Insufficient documentation

## 2021-09-16 DIAGNOSIS — K219 Gastro-esophageal reflux disease without esophagitis: Secondary | ICD-10-CM | POA: Diagnosis not present

## 2021-09-16 DIAGNOSIS — R1084 Generalized abdominal pain: Secondary | ICD-10-CM | POA: Diagnosis present

## 2021-09-16 LAB — CBC
HCT: 34 % — ABNORMAL LOW (ref 36.0–46.0)
Hemoglobin: 11 g/dL — ABNORMAL LOW (ref 12.0–15.0)
MCH: 34.4 pg — ABNORMAL HIGH (ref 26.0–34.0)
MCHC: 32.4 g/dL (ref 30.0–36.0)
MCV: 106.3 fL — ABNORMAL HIGH (ref 80.0–100.0)
Platelets: 237 10*3/uL (ref 150–400)
RBC: 3.2 MIL/uL — ABNORMAL LOW (ref 3.87–5.11)
RDW: 15.9 % — ABNORMAL HIGH (ref 11.5–15.5)
WBC: 6.6 10*3/uL (ref 4.0–10.5)
nRBC: 0 % (ref 0.0–0.2)

## 2021-09-16 LAB — COMPREHENSIVE METABOLIC PANEL
ALT: 79 U/L — ABNORMAL HIGH (ref 0–44)
AST: 45 U/L — ABNORMAL HIGH (ref 15–41)
Albumin: 4.2 g/dL (ref 3.5–5.0)
Alkaline Phosphatase: 162 U/L — ABNORMAL HIGH (ref 38–126)
Anion gap: 3 — ABNORMAL LOW (ref 5–15)
BUN: 11 mg/dL (ref 6–20)
CO2: 27 mmol/L (ref 22–32)
Calcium: 9 mg/dL (ref 8.9–10.3)
Chloride: 107 mmol/L (ref 98–111)
Creatinine, Ser: 0.9 mg/dL (ref 0.44–1.00)
GFR, Estimated: >60 mL/min (ref >60)
Glucose, Bld: 132 mg/dL — ABNORMAL HIGH (ref 70–99)
Potassium: 4.2 mmol/L (ref 3.5–5.1)
Sodium: 137 mmol/L (ref 135–145)
Total Bilirubin: 0.8 mg/dL (ref 0.3–1.2)
Total Protein: 8.4 g/dL — ABNORMAL HIGH (ref 6.5–8.1)

## 2021-09-16 LAB — URINALYSIS, ROUTINE W REFLEX MICROSCOPIC
Bilirubin Urine: NEGATIVE
Glucose, UA: NEGATIVE mg/dL
Hgb urine dipstick: NEGATIVE
Ketones, ur: NEGATIVE mg/dL
Nitrite: NEGATIVE
Protein, ur: 30 mg/dL — AB
Specific Gravity, Urine: 1.023 (ref 1.005–1.030)
WBC, UA: >50 WBC/hpf — ABNORMAL HIGH (ref 0–5)
pH: 5 (ref 5.0–8.0)

## 2021-09-16 LAB — I-STAT BETA HCG BLOOD, ED (MC, WL, AP ONLY): I-stat hCG, quantitative: 5.1 m[IU]/mL — ABNORMAL HIGH (ref <5)

## 2021-09-16 LAB — LIPASE, BLOOD: Lipase: 22 U/L (ref 11–51)

## 2021-09-16 MED ORDER — IOHEXOL 350 MG/ML SOLN
80.0000 mL | Freq: Once | INTRAVENOUS | Status: AC | PRN
  Administered 2021-09-16: 80 mL via INTRAVENOUS

## 2021-09-16 NOTE — ED Triage Notes (Signed)
Pt had surgery on the 14th and has not had a BM since, has been able to move just a little out. Throwing up her medicine 3 days ago

## 2021-09-16 NOTE — ED Provider Notes (Signed)
[facility] DEPT Provider Note   CSN: [id number] Arrival date & time: 09/16/21  1653     History  Chief Complaint  Patient presents with   Constipation    Erin [name] is a 61 y.o. female with past medical history significant for metastatic breast cancer, type 2 diabetes, iron deficiency anemia, fallopian tube cancer, and chronic back pain who presents to the emergency department complaining of abdominal pain and constipation.  Patient underwent surgery on 12/14, and states she has not had a normal bowel movement since.  She is only been able to move little bits of stool.  She also reports vomiting up her medicine for the past 3 days.  She is passing small amounts of gas.  Of note patient is deaf, sign language interpreter used during interview.   Constipation Associated symptoms: abdominal pain, nausea and vomiting   Associated symptoms: no diarrhea, no dysuria and no fever      Past Medical History:  Diagnosis Date   Arthritis    knees, elbows   Borderline glaucoma of right eye    BRCA2 gene mutation positive 02/17/2018   BRCA2 c.4552del (p.[id number]*25)  Result reported out on 02/15/2018.    Breast cancer ([place]) 05/2017   left   Chronic back pain    Deaf    per pt born hearing and at age 22 1/2 lost hearing , was told by mother unknown cause, can miminally hear in left and no hearing on right    Depression    Elevated cancer antigen 125 (CA 125)    Family history of breast cancer    Family history of breast cancer    Family history of colon cancer    Genetic testing 02/17/2018   The Common Hereditary Cancer Panel offered by [facility] includes sequencing and/or deletion duplication testing of the following 47 genes: APC, ATM, AXIN2, BARD1, BMPR1A, BRCA1, BRCA2, BRIP1, CDH1, CDKN2A (p14ARF), CDKN2A (p16INK4a), CKD4, CHEK2, CTNNA1, DICER1, EPCAM (Deletion/duplication testing only), GREM1 (promoter region deletion/duplication testing only), KIT,  MEN1, MLH1, MSH2, MSH3, MSH6, MU   GERD (gastroesophageal reflux disease)    History of cancer chemotherapy    left breast cancer 09-08-2017 to 10-09-2017;  fallopion tube cancer  12-15-2017  to 06-28-2018   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 oncologist-  dr [name]   11-04-2017  s/p  LAVH w/ BSO,  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;  completed chemo 06-28-2018   History of external beam radiation therapy    left breast  11-25-2017  to 01-07-2018   Hyperlipidemia    Hypertension    followed by pcp   (10-05-2019  per pt never had stress test)   IDA (iron deficiency anemia)    Malignant neoplasm of lower-outer quadrant of left breast of female, estrogen receptor negative [facility]) oncologist-- dr [name]   dx 08/ 2018--- Stage IA, DCIS,  ER/ PR negative,  HER-2 positive;  06-09-2017 s/p left breast lumpectomy with node dissection;  completed chemo 10-09-2017  and radiation 01-07-2018/  hercepton completed 06-28-2018   Non-insulin dependent type 2 diabetes mellitus ([place])    followed by pcp   (10-05-2019 per pt check cbg every other day in AM,  fasting cbg-- 105)   Numbness of right thumb    Wears glasses      Home Medications Prior to Admission medications   Medication Sig Start Date End Date Taking? Authorizing Provider  Accu-Chek FastClix Lancets MISC Use  as instructed. Inject into the skin twice daily 08/05/21   [name], NP  atorvastatin (LIPITOR) 40 MG tablet Take 1 tablet (40 mg total) by mouth daily. 04/04/21   Scot Jun, FNP  Blood Glucose Calibration (ACCU-CHEK GUIDE CONTROL) LIQD 1 each by In Vitro route once as needed for up to 1 dose. 12/26/18   [name], NP  capecitabine (XELODA) 500 MG tablet Take 2 tablets (1,000 mg total) by mouth 2 (two) times daily after a meal. Take for 14 days on and 7 days off, repeat every 21 days. 08/12/21   [name], MD  CINNAMON PO Take 2,400 mg by mouth every evening. 1200  mg per cap    [provider]  diclofenac Sodium (VOLTAREN) 1 % GEL Apply 1 application topically daily as needed (pain).    [provider]  glucose blood (ACCU-CHEK GUIDE) test strip Use as instructed. Check blood glucose by fingerstick twice per day. 11/18/20   [name], NP  lidocaine-prilocaine (EMLA) cream Apply 1 application topically as needed. Patient not taking: Reported on 08/21/2021 09/03/20   Harle Stanford., PA-C  lidocaine-prilocaine (EMLA) cream Apply to affected area once Patient taking differently: Apply 1 application topically daily as needed. Apply to affected area once 07/30/21   [name], MD  loperamide (IMODIUM) 2 MG capsule Take 2 mg by mouth as needed for diarrhea or loose stools. Patient not taking: Reported on 08/20/2021    [name], MD  memantine (NAMENDA) 10 MG tablet Take 1 tablet (10 mg total) by mouth 2 (two) times daily. Patient not taking: Reported on 08/21/2021 07/30/21   Hayden Pedro, PA-C  memantine [facility]) 5 MG tablet Begin this prescription the first day of brain radiation. Week 1: take one tablet po qam. Week 2: take one tablet qam and qpm. Week 3: take two tablets qam, and one tablet po q pm. Week 4: take two tablets qam and qpm. Fill subsequent prescription q month. Patient taking differently: Take 5-10 mg by mouth See admin instructions. Begin this prescription the first day of brain radiation. Week 1: take one tablet po qam. Week 2: take one tablet qam and qpm. Week 3: take two tablets qam, and one tablet po q pm. Week 4: take two tablets qam and qpm. Fill subsequent prescription q month. 07/30/21   Hayden Pedro, PA-C  Multiple Vitamins-Minerals (MULTIVITAMIN ADULT) CHEW Chew 2 each by mouth daily.    [provider]  ondansetron (ZOFRAN ODT) 4 MG disintegrating tablet Take 1 tablet (4 mg total) by mouth every 8 (eight) hours as needed for nausea or vomiting. Patient not taking: Reported on  08/21/2021 04/04/21   Scot Jun, FNP  ondansetron (ZOFRAN) 8 MG tablet Take 1 tablet (8 mg total) by mouth 2 (two) times daily as needed (Nausea or vomiting). 07/30/21   [name], MD  prochlorperazine (COMPAZINE) 10 MG tablet Take 1 tablet (10 mg total) by mouth every 6 (six) hours as needed (Nausea or vomiting). Patient not taking: Reported on 08/21/2021 07/30/21   [name], MD  tucatinib (TUKYSA) 150 MG tablet Take 2 tablets (300 mg total) by mouth 2 (two) times daily. Take every 12 hrs at the same time each day with or without a meal. 07/30/21   [name], MD      Allergies    Patient has no known allergies.    Review of Systems   Review of Systems  Constitutional:  Negative for  chills and fever.  Respiratory:  Negative for shortness of breath.   Cardiovascular:  Negative for chest pain.  Gastrointestinal:  Positive for abdominal pain, constipation, nausea and vomiting. Negative for blood in stool and diarrhea.  Genitourinary:  Negative for dysuria, flank pain and urgency.  All other systems reviewed and are negative.  Physical Exam Updated Vital Signs BP 111/72 (BP Location: Right Arm)    Pulse 97    Temp 98.9 F (37.2 C) (Oral)    Resp 14    Ht $R'5\' 1"'Tj$  (1.549 m)    LMP 07/09/2017 (LMP Unknown)    SpO2 99%    BMI 25.41 kg/m  Physical Exam Vitals and nursing note reviewed.  Constitutional:      Appearance: Normal appearance.  HENT:     Head: Normocephalic and atraumatic.  Eyes:     Conjunctiva/sclera: Conjunctivae normal.  Cardiovascular:     Rate and Rhythm: Normal rate and regular rhythm.  Pulmonary:     Effort: Pulmonary effort is normal. No respiratory distress.     Breath sounds: Normal breath sounds.  Abdominal:     General: There is no distension.     Palpations: Abdomen is soft.     Tenderness: There is generalized abdominal tenderness and tenderness in the right upper quadrant and right lower quadrant. There is no right CVA tenderness, left CVA  tenderness, guarding or rebound.  Skin:    General: Skin is warm and dry.  Neurological:     General: No focal deficit present.     Mental Status: She is alert.    ED Results / Procedures / Treatments   Labs (all labs ordered are listed, but only abnormal results are displayed) Labs Reviewed  COMPREHENSIVE METABOLIC PANEL - Abnormal; Notable for the following components:      Result Value   Glucose, Bld 132 (*)    Total Protein 8.4 (*)    AST 45 (*)    ALT 79 (*)    Alkaline Phosphatase 162 (*)    Anion gap 3 (*)    All other components within normal limits  CBC - Abnormal; Notable for the following components:   RBC 3.20 (*)    Hemoglobin 11.0 (*)    HCT 34.0 (*)    MCV 106.3 (*)    MCH 34.4 (*)    RDW 15.9 (*)    All other components within normal limits  URINALYSIS, ROUTINE W REFLEX MICROSCOPIC - Abnormal; Notable for the following components:   APPearance HAZY (*)    Protein, ur 30 (*)    Leukocytes,Ua LARGE (*)    WBC, UA >50 (*)    Bacteria, UA RARE (*)    All other components within normal limits  I-STAT BETA HCG BLOOD, ED ([facility], [facility], [facility] ONLY) - Abnormal; Notable for the following components:   I-stat hCG, quantitative 5.1 (*)    All other components within normal limits  LIPASE, BLOOD    EKG None  Radiology CT ABDOMEN PELVIS W CONTRAST  Result Date: 09/16/2021 CLINICAL DATA:  Metastatic breast cancer, recent liver ablation, abdominal pain, constipation EXAM: CT ABDOMEN AND PELVIS WITH CONTRAST TECHNIQUE: Multidetector CT imaging of the abdomen and pelvis was performed using the standard protocol following bolus administration of intravenous contrast. CONTRAST:  36mL OMNIPAQUE IOHEXOL 350 MG/ML SOLN COMPARISON:  07/01/2021, 07/21/2021 FINDINGS: Lower chest: Minimal scarring or atelectasis within the lower lobes. No acute pleural or parenchymal lung disease. Hepatobiliary: Linear hypodensity within the right lobe liver, extending to  a 3.7 x 2.8 cm rounded  hypodensity, consistent with sequela of recent CT-guided ablation for metastatic breast cancer. The remainder of the liver is unremarkable. Gallbladder is normal. Pancreas: Unremarkable. No pancreatic ductal dilatation or surrounding inflammatory changes. Spleen: Normal in size without focal abnormality. Adrenals/Urinary Tract: Adrenal glands are unremarkable. Kidneys are normal, without renal calculi, focal lesion, or hydronephrosis. Bladder is only minimally distended, which limits evaluation. Stomach/Bowel: No bowel obstruction or ileus. No bowel wall thickening or inflammatory change. No significant fecal retention. Vascular/Lymphatic: No significant vascular findings are present. No enlarged abdominal or pelvic lymph nodes. Reproductive: Status post hysterectomy. No adnexal masses. Other: No free fluid or free gas. Fat containing supraumbilical ventral hernia unchanged. No bowel herniation. Musculoskeletal: Diffuse sclerotic lesions throughout the visualized axial and appendicular skeleton consistent with widespread bony metastases. Chronic L3 compression fracture. No acute fractures. Reconstructed images demonstrate no additional findings. IMPRESSION: 1. Postprocedural changes from right lobe liver ablation for metastatic disease. No evidence of complication. 2. Stable widespread bony metastases. 3. No bowel obstruction or ileus.  No significant fecal retention. 4. Stable fat containing supraumbilical ventral hernia. Electronically Signed   By: Randa Ngo M.D.   On: 09/16/2021 21:47    Procedures Procedures    Medications Ordered in ED Medications  iohexol (OMNIPAQUE) 350 MG/ML injection 80 mL (80 mLs Intravenous Contrast Given 09/16/21 2123)    ED Course/ Medical Decision Making/ A&P                           Medical Decision Making This patient presents to the ED for concern of abdominal pain and constipation, this involves an extensive number of treatment options, and is a complaint that  carries with it a high risk of complications and morbidity. The emergent differential diagnosis includes, but is not limited to,  The causes of generalized abdominal pain include but are not limited to AAA, mesenteric ischemia, appendicitis, diverticulitis, DKA, gastritis, gastroenteritis, AMI, nephrolithiasis, pancreatitis, peritonitis, adrenal insufficiency,lead poisoning, iron toxicity, intestinal ischemia, constipation, UTI,SBO/LBO, splenic rupture, biliary disease, IBD, IBS, PUD, or hepatitis.  Co morbidities that complicate the patient evaluation: Metastatic breast cancer, fallopian tube cancer, chronic back pain  Additional history obtained from chart review External records from outside source obtained and reviewed including op note from 12/14, patient had an IR guided microwave liver ablation for liver metastasis.  Physical exam performed. The pertinent findings include: Generalized abdominal tenderness to palpation, worse in the right upper and lower quadrants.  There is no guarding or rigidity.  Abdomen overall soft and nondistended.  Patient continues to feel slightly nauseous, but has had no episodes of vomiting while in the emergency department.  Lab Tests: I Ordered, and personally interpreted labs.  The pertinent results include: No leukocytosis, hemoglobin appears stable compared to prior, slightly elevated liver enzymes which could be related to liver metastasis, electrolytes otherwise in normal limits.  Slightly elevated beta-hCG, upon chart review this appears normal in the patient's documented oncologic history.  Urinalysis with large leukocytes, negative for nitrites.   Imaging Studies ordered: I ordered imaging studies including CT abdomen pelvis with contrast I independently visualized and interpreted imaging which showed no evidence of bowel obstruction, stable known bony and liver metastases. I agree with the radiologist interpretation.   Offered patient a soapsuds enema  for constipation.  Patient was in a fast-track room, and we did not have the ability to perform this.  Patient was initially placed back on the  waiting room, I offered her to either wait for an additional room to perform this or discharge and attempt enema at home.  She elected to do this at home.  I think this is reasonable.  Dispostion: After consideration of the diagnostic results and the patients response to treatment, I feel that patient is not requiring admission or inpatient treatment for her symptoms.  Plan to treat constipation symptomatically at home.  I feel safe with this plan as patient had otherwise normal lab work compared to her baseline, as well as no obstruction on her CT scan.  Encourage patient to keep her oncologist in the loop.  Discussed reasons to return to the emergency department, patient and her caregiver are agreeable to plan.  Final Clinical Impression(s) / ED Diagnoses Final diagnoses:  Constipation, unspecified constipation type    Rx / DC Orders ED Discharge Orders     None      Portions of this report may have been transcribed using voice recognition software. Every effort was made to ensure accuracy; however, inadvertent computerized transcription errors may be present.    Estill Cotta 09/16/21 2315    Daleen Bo, MD 09/17/21 1147

## 2021-09-16 NOTE — Discharge Instructions (Addendum)
You were seen in the emergency department today for constipation.  As we discussed your lab work was reassuring, and your CT showed no evidence of obstruction.  We normally treat constipation with either over-the-counter medicines or enemas.  I have attached instructions of how to do this in your paperwork.  I would also like you to continue drinking lots of fluids, and let your oncologist know about the results today.  Continue to monitor how you're doing and return to the ER for new or worsening symptoms such as continued constipation after the enema, fevers, or continued vomiting.   It has been a pleasure seeing and caring for you today and I hope you start feeling better soon!

## 2021-09-16 NOTE — Telephone Encounter (Signed)
Received notification from [facility] via fax that pt called 09/15/2021 and states, "called states took medication that upset her stomach, and hasn't had a BM and is uncomfortable. Abdomen is more swollen than usual.  I called pt's mother who states pt has been attempting to take capecitabine but has been vomiting and has not had a BM since 08/27/21. Advised pt's mother to take pt to ED. Pt had liver ablation 08/27/21 and advised pt's mother constipation and vomiting could be an emergency. Pt's mother states she is taking pt to [facility] ED.

## 2021-09-16 NOTE — ED Provider Notes (Signed)
Emergency Medicine Provider Triage Evaluation Note  JANE BIRKEL , a 61 y.o. female  was evaluated in triage.  Pt complains of abdominal pain and constipation.  Patient had surgery on 12/14, and states that she has not had a full bowel movement since.  She has only been able to move a little bit of stool.  She is also been vomiting up her medicine for the past 3 days.  She states she is only passing very small amounts of gas.  Review of Systems  Positive: Abdominal pain, nausea, vomiting, constipation Negative: Fever, chills  Physical Exam  BP 117/76 (BP Location: Left Arm)    Pulse 100    Temp 98.9 F (37.2 C) (Oral)    Resp 16    Ht 5\' 1"  (1.549 m)    LMP 07/09/2017 (LMP Unknown)    SpO2 98%    BMI 25.41 kg/m  Gen:   Awake, no distress   Resp:  Normal effort  MSK:   Moves extremities without difficulty  Other:  Tenderness to palpation in the right upper quadrant, no guarding or rigidity  Medical Decision Making  Medically screening exam initiated at 5:27 PM.  Appropriate orders placed.  FORESTINE MACHO was informed that the remainder of the evaluation will be completed by another provider, this initial triage assessment does not replace that evaluation, and the importance of remaining in the ED until their evaluation is complete.  Patient requiring sign language interpreter.  Family member uses interpreter in triage.  She reports that patient reads lips quite well, but is requesting the formal interpreter once patient has a room.  Will obtain labs, as well as imaging to rule out small bowel obstruction.   Estill Cotta 09/16/21 1728    Daleen Bo, MD 09/17/21 1147

## 2021-09-17 ENCOUNTER — Telehealth: Payer: Self-pay

## 2021-09-17 NOTE — Telephone Encounter (Signed)
Transition Care Management Unsuccessful Follow-up Telephone Call  Date of discharge and from where:  09/16/2021-[place]   Attempts:  1st Attempt  Reason for unsuccessful TCM follow-up call:  Unable to reach patient

## 2021-09-18 NOTE — Telephone Encounter (Signed)
Transition Care Management Follow-up Telephone Call Date of discharge and from where: 09/16/2021 from [name] How have you been since you were released from the hospital? Pt stated that she is feeling much better and did not have any questions or concerns at this time.  Any questions or concerns? No  Items Reviewed: Did the pt receive and understand the discharge instructions provided? Yes  Medications obtained and verified? Yes  Other? No  Any new allergies since your discharge? No  Dietary orders reviewed? No Do you have support at home? Yes   Functional Questionnaire: (I = Independent and D = Dependent) ADLs: I  Bathing/Dressing- I  Meal Prep- I  Eating- I  Maintaining continence- I  Transferring/Ambulation- I  Managing Meds- I   Follow up appointments reviewed:  PCP Hospital f/u appt confirmed? No   Specialist Hospital f/u appt confirmed? No  Pt does have upcoming  Are transportation arrangements needed? No  If their condition worsens, is the pt aware to call PCP or go to the Emergency Dept.? Yes Was the patient provided with contact information for the PCP's office or ED? Yes Was to pt encouraged to call back with questions or concerns? Yes

## 2021-09-22 ENCOUNTER — Ambulatory Visit
Admission: RE | Admit: 2021-09-22 | Discharge: 2021-09-22 | Disposition: A | Discharge status: Home / Self Care | Payer: Medicaid Other | Source: Ambulatory Visit | Attending: Radiation Oncology | Admitting: Radiation Oncology

## 2021-09-22 DIAGNOSIS — C50512 Malignant neoplasm of lower-outer quadrant of left female breast: Secondary | ICD-10-CM

## 2021-09-22 DIAGNOSIS — C7949 Secondary malignant neoplasm of other parts of nervous system: Secondary | ICD-10-CM

## 2021-09-23 ENCOUNTER — Inpatient Hospital Stay: Payer: Medicaid Other | Attending: Gynecologic Oncology | Admitting: Pharmacist

## 2021-09-23 ENCOUNTER — Other Ambulatory Visit: Payer: Self-pay

## 2021-09-23 VITALS — BP 141/75 | HR 113 | Temp 97.5°F | Resp 18 | Ht 61.0 in | Wt 125.9 lb

## 2021-09-23 DIAGNOSIS — K219 Gastro-esophageal reflux disease without esophagitis: Secondary | ICD-10-CM | POA: Insufficient documentation

## 2021-09-23 DIAGNOSIS — Z79899 Other long term (current) drug therapy: Secondary | ICD-10-CM | POA: Insufficient documentation

## 2021-09-23 DIAGNOSIS — Z171 Estrogen receptor negative status [ER-]: Secondary | ICD-10-CM | POA: Insufficient documentation

## 2021-09-23 DIAGNOSIS — Z5112 Encounter for antineoplastic immunotherapy: Secondary | ICD-10-CM | POA: Insufficient documentation

## 2021-09-23 DIAGNOSIS — R110 Nausea: Secondary | ICD-10-CM | POA: Insufficient documentation

## 2021-09-23 DIAGNOSIS — C50512 Malignant neoplasm of lower-outer quadrant of left female breast: Secondary | ICD-10-CM | POA: Insufficient documentation

## 2021-09-23 DIAGNOSIS — C7951 Secondary malignant neoplasm of bone: Secondary | ICD-10-CM | POA: Insufficient documentation

## 2021-09-23 DIAGNOSIS — Z923 Personal history of irradiation: Secondary | ICD-10-CM | POA: Insufficient documentation

## 2021-09-23 DIAGNOSIS — C7932 Secondary malignant neoplasm of cerebral meninges: Secondary | ICD-10-CM | POA: Insufficient documentation

## 2021-09-23 NOTE — Assessment & Plan Note (Signed)
Left lumpectomy: IDC grade 3, 1.2 cm, DCIS is present, margins negative, 0/6 lymph nodes negative, ER 0%, PR 0%, HER-2 positive ratio 2.28, Ki-67 20%, T1 BN 0 stage IA S/P Taxol-Herceptin BRCA 2 Mutation Positive  10/20/19:Bone mets involving Right hip, Bil Iliac bones, T8, T9, L2 and L3, Rt prox humerus, Righ glenoid. Some lytic and some sclerotic. Mild Left supraclav adenopathy, 2 small lesions basal ganglia (could be lacunar infarcts)  Biopsy the supra clav LN: 10/31/2019: Metastatic breast cancer, ER/PR negative HER-2 equivocal by IHC, FISHpositive Treatmentsummary: Kadcyla every 3-week treatment.Discontinued for progression  MRI of the back 01/07/2020: Extensive metastatic disease to the involved spine. Tumor replacement of L3 vertebral body with pathologic compression fracture, epidural and extraosseous extension. Tumor extending throughout L3 foramen Status post palliative radiation therapyCausing severe dysphagia and dehydration and hospitalization5/17/2021-02/03/2020 Talazoparib started 02/07/2020 CT CAP 09/20/19: No evidence of ST mets, tiny inferior right hepatic lesion substantially decreased(3 mm), bone mets have become sclerotic suggest healing CT CAP 12/26/2020:Stable diffuse bone mets CT CAP 07/01/2021: Stable bone metastases. New liver lesion 1.5 cm Brain MRI 07/12/2021: Abnormal cerebellar signal suspicious for leptomeningeal spread. Lumbar puncture 07/15/2021: Highly suspicious for leptomeningeal carcinomatosis 08/12/2021 through 08/25/2021 whole brain radiation therapy was completed Tucatinib plus Xeloda plus Herceptin based on HER-2 Climb study beginning 12 1, 2022. Liver ablation was completed on August 27, 2021 ------------------------------------------------------------------------------------------------------------------------------------------------------ Echocardiogram  Current treatment:Tucatinib, capecitabine, trastuzumab (xeloda Tucatinib held for nausea  and diarrhea) Toxicities: Profound Nausea and Diarrhea  She did have a slight elevation in her LFTs.  This is likely due to the liver ablation that she underwent on August 27, 2021.  I reviewed this with Dr. [name] we will continue to monitor.

## 2021-09-23 NOTE — Progress Notes (Signed)
[place]       Telephone: [phone]?Fax: [phone]   Oncology Clinical Pharmacist Practitioner Progress Note  Erin [name] was contacted via in-person visit to discuss her chemotherapy regimen for capecitabine / tucatinib / trastuzumab which they receive under the care of Dr. Nicholas [name].   Current treatment regimen and start date Capecitabine (08/15/21) Tucatinib (08/15/21) Trastuzumab (08/14/21)  Interval History She continues on capecitabine 1000 mg by mouth  every 12 hours on days 1 to 14 of a 21-day cycle and on tucatinib 300 mg by mouth  every 12 hours on days 1 to 28 of a 28-day cycle. This is being given  in combination with trastuzumab . Therapy is planned to continue until disease progression or unacceptable toxicity.   Response to Therapy Erin [name] was seen today by clinical pharmacy to assess her tolerance of her current treatment regimen which consists of capecitabine, tucatinib, and trastuzumab.  She was last seen by clinical pharmacy on December 7, and Dr. Lindi [name] on December 1. She also saw NP Erin [name] on 09/03/21.  She did have a visit to the emergency room for constipation on January 3 and at that time was vomiting up medication.  After evaluation, she was discharged on that same day after finding no evidence of obstruction on imaging per their report.  Erin [name] was accommodated today by her interpreter Erin [name] and her mom Erin [name].  She continues to unfortunately have nausea and vomiting.  She finished her last cycle of capecitabine on January 7.  Today is day 3 of her off week.  Her last dose of tucatinib was this morning.  She was not planned to have labs done today, but was planned to have labs tomorrow with a visit with Dr. Lindi [name] and tentatively trastuzumab infusion.  We discussed options today which consisted of: Getting labs today instead of tomorrow to evaluate for dehydration/electrolyte abnormalities, or continuing as scheduled for tomorrow  labs with tentative trastuzumab, or if feeling worse enough presenting to the ED today for evaluation.  After consideration, Erin [name] feels that she can go home and rest, taking fluids and small meals until she sees Dr. Lindi [name] with labs tomorrow morning.  Hand written information was given on nausea and vomiting that went over some recommendations for fluids and items to try to eat.  She was instructed that if her symptoms get worse or she starts having fevers, to contact the clinic immediately as they would likely recommend presenting to the ED.  She was instructed not to take anymore tucatinib or capecitabine at this time and that more than likely we would be dose reducing both medications before she restarts.  This plan was communicated to Dr. Lindi [name] and he is in agreement with this plan.  Erin [name] verbalized understanding of this plan.  Labs, vitals, treatment parameters, and manufacturer guidelines assessing toxicity were reviewed with Erin [name] today. Based on these values, patient is in agreement to HOLD therapy at this time.  Allergies No Known Allergies  Vitals Vitals with BMI 09/23/2021 09/16/2021 09/16/2021  Height _0  - _1   Weight 125 lbs 14 oz - -  BMI 92.3 - -  Systolic 300 762 -  Diastolic 75 72 -  [name] 263 97 -     Laboratory Data CBC EXTENDED Latest Ref Rng & Units 09/16/2021 09/03/2021 08/27/2021  WBC 4.0 - 10.5 K/uL 6.6 6.6 5.8  RBC 3.87 - 5.11 MIL/uL 3.20(L) 2.67(L) 2.87(L)  HGB 12.0 -  15.0 g/dL 11.0(L) 9.2(L) 10.2(L)  HCT 36.0 - 46.0 % 34.0(L) 28.2(L) 31.1(L)  PLT 150 - 400 K/uL 237 151 166  NEUTROABS 1.7 - 7.7 K/uL - 5.1 -  LYMPHSABS 0.7 - 4.0 K/uL - 0.7 -    CMP Latest Ref Rng & Units 09/16/2021 09/03/2021 08/27/2021  Glucose 70 - 99 mg/dL 132(H) 113(H) 122(H)  BUN 6 - 20 mg/dL _0 Creatinine 0.44 - 1.00 mg/dL 0.90 0.69 0.80  Sodium 135 - 145 mmol/L 137 140 137  Potassium 3.5 - 5.1 mmol/L 4.2 3.6 3.3(L)  Chloride 98 - 111 mmol/L 107 104 101  CO2 22 -  32 mmol/L _1 Calcium 8.9 - 10.3 mg/dL 9.0 9.0 9.0  Total Protein 6.5 - 8.1 g/dL 8.4(H) 7.3 7.8  Total Bilirubin 0.3 - 1.2 mg/dL 0.8 0.6 0.7  Alkaline Phos 38 - 126 U/L 162(H) 131(H) 64  AST 15 - 41 U/L 45(H) 31 23  ALT 0 - 44 U/L 79(H) 126(H) 33    Lab Results  Component Value Date   MG 2.0 02/03/2020   MG 1.9 02/02/2020   MG 1.9 02/01/2020     Adverse Effects Assessment Nausea / Vomiting: increased since our last visit.  Will HOLD therapy and have labs drawn as scheduled tomorrow Unsteadiness: likely multifactorial.  Does not have DME at home but her mom is able to assist. Dr. Lindi [name] will evaluate tomorrow  Adherence Assessment Erin [name] reports missing 0 doses over the past 4 weeks due to adherence.   Reason for missed dose: N/A Patient was re-educated on importance of adherence.   Access Assessment [name] is currently receiving her capecitabine / tucatinib through [facility] concerns:  none  Medication Reconciliation The patient's medication list was reviewed today with the patient? No New medications or herbal supplements have recently been started? No  Any medications have been discontinued? No  The medication list was updated and reconciled based on the patient's most recent medication list in the electronic medical record (EMR) including herbal products and OTC medications.   Medications Current Outpatient Medications  Medication Sig Dispense Refill   Accu-Chek FastClix Lancets MISC Use as instructed. Inject into the skin twice daily 100 each 3   atorvastatin (LIPITOR) 40 MG tablet Take 1 tablet (40 mg total) by mouth daily. 90 tablet 1   Blood Glucose Calibration (ACCU-CHEK GUIDE CONTROL) LIQD 1 each by In Vitro route once as needed for up to 1 dose. 1 each 0   capecitabine (XELODA) 500 MG tablet Take 2 tablets (1,000 mg total) by mouth 2 (two) times daily after a meal. Take for 14 days on and 7 days off, repeat every  21 days. 56 tablet 3   CINNAMON PO Take 2,400 mg by mouth every evening. 1200 mg per cap     diclofenac Sodium (VOLTAREN) 1 % GEL Apply 1 application topically daily as needed (pain).     glucose blood (ACCU-CHEK GUIDE) test strip Use as instructed. Check blood glucose by fingerstick twice per day. 100 each 12   lidocaine-prilocaine (EMLA) cream Apply 1 application topically as needed. (Patient not taking: Reported on 08/21/2021) 30 g 5   lidocaine-prilocaine (EMLA) cream Apply to affected area once (Patient taking differently: Apply 1 application topically daily as needed. Apply to affected area once) 30 g 3   loperamide (IMODIUM) 2 MG capsule Take 2 mg by mouth as needed for diarrhea or loose stools. (Patient not  taking: Reported on 08/20/2021)     memantine (NAMENDA) 10 MG tablet Take 1 tablet (10 mg total) by mouth 2 (two) times daily. (Patient not taking: Reported on 08/21/2021) 60 tablet 4   memantine (NAMENDA) 5 MG tablet Begin this prescription the first day of brain radiation. Week 1: take one tablet po qam. Week 2: take one tablet qam and qpm. Week 3: take two tablets qam, and one tablet po q pm. Week 4: take two tablets qam and qpm. Fill subsequent prescription q month. (Patient taking differently: Take 5-10 mg by mouth See admin instructions. Begin this prescription the first day of brain radiation. Week 1: take one tablet po qam. Week 2: take one tablet qam and qpm. Week 3: take two tablets qam, and one tablet po q pm. Week 4: take two tablets qam and qpm. Fill subsequent prescription q month.) 70 tablet 0   Multiple Vitamins-Minerals (MULTIVITAMIN ADULT) CHEW Chew 2 each by mouth daily.     ondansetron (ZOFRAN ODT) 4 MG disintegrating tablet Take 1 tablet (4 mg total) by mouth every 8 (eight) hours as needed for nausea or vomiting. (Patient not taking: Reported on 08/21/2021) 30 tablet 0   ondansetron (ZOFRAN) 8 MG tablet Take 1 tablet (8 mg total) by mouth 2 (two) times daily as needed (Nausea  or vomiting). 30 tablet 1   prochlorperazine (COMPAZINE) 10 MG tablet Take 1 tablet (10 mg total) by mouth every 6 (six) hours as needed (Nausea or vomiting). (Patient not taking: Reported on 08/21/2021) 30 tablet 1   tucatinib (TUKYSA) 150 MG tablet Take 2 tablets (300 mg total) by mouth 2 (two) times daily. Take every 12 hrs at the same time each day with or without a meal. 120 tablet 6   No current facility-administered medications for this visit.   Facility-Administered Medications Ordered in Other Visits  Medication Dose Route Frequency Provider Last Rate Last Admin   heparin lock flush 100 unit/mL  500 Units Intracatheter Once PRN Erin Lose, MD       sodium chloride flush (NS) 0.9 % injection 10 mL  10 mL Intracatheter PRN Erin Lose, MD        Drug-Drug Interactions (DDIs) DDIs were evaluated? Yes Significant DDIs? No  The patient was instructed to speak with their health care provider and/or the oral chemotherapy pharmacist before starting any new drug, including prescription or over the counter, natural / herbal products, or vitamins.  Supportive Care Nausea: has nausea medications at home but having trouble keeping food/fluids/medications down.  Was able to take tucatinib this morning.  Will HOLD as above  Dosing Assessment Hepatic adjustments needed? No  Renal adjustments needed? No  Toxicity adjustments needed? Yes , likely will dose reduce once nausea subsides The current dosing regimen is not appropriate to continue at this time.  Follow-Up Plan Labs / Dr. Lindi [name] tomorrow - may receive trastuzumab +/- fluids if parameters met Dr. Lindi [name] will assess unsteadiness tomorrow Dose reductions of both tucatinib and capecitabine likely once nausea subsides. Discussed lowering capecitabine to 500 mg PO every 12 hours 14 on / 7 off and tucatinib to 150 mg every 12 hours continuous  Erin [name] participated in the discussion, expressed understanding, and voiced agreement  with the above plan. All questions were answered to her satisfaction. The patient was advised to contact the clinic at [phone] with any questions or concerns prior to her return visit.   I spent 30 minutes assessing and educating the patient.  Raina Mina, RPH-CPP, 09/23/2021  10:42 AM

## 2021-09-24 ENCOUNTER — Inpatient Hospital Stay (HOSPITAL_BASED_OUTPATIENT_CLINIC_OR_DEPARTMENT_OTHER): Payer: Medicaid Other | Admitting: Hematology and Oncology

## 2021-09-24 ENCOUNTER — Inpatient Hospital Stay: Payer: Medicaid Other

## 2021-09-24 ENCOUNTER — Other Ambulatory Visit: Payer: Self-pay

## 2021-09-24 DIAGNOSIS — K219 Gastro-esophageal reflux disease without esophagitis: Secondary | ICD-10-CM | POA: Diagnosis not present

## 2021-09-24 DIAGNOSIS — Z79899 Other long term (current) drug therapy: Secondary | ICD-10-CM | POA: Diagnosis not present

## 2021-09-24 DIAGNOSIS — C7932 Secondary malignant neoplasm of cerebral meninges: Secondary | ICD-10-CM | POA: Diagnosis present

## 2021-09-24 DIAGNOSIS — Z7189 Other specified counseling: Secondary | ICD-10-CM

## 2021-09-24 DIAGNOSIS — Z171 Estrogen receptor negative status [ER-]: Secondary | ICD-10-CM | POA: Diagnosis not present

## 2021-09-24 DIAGNOSIS — C50512 Malignant neoplasm of lower-outer quadrant of left female breast: Secondary | ICD-10-CM

## 2021-09-24 DIAGNOSIS — C7949 Secondary malignant neoplasm of other parts of nervous system: Secondary | ICD-10-CM

## 2021-09-24 DIAGNOSIS — Z5112 Encounter for antineoplastic immunotherapy: Secondary | ICD-10-CM | POA: Diagnosis present

## 2021-09-24 DIAGNOSIS — Z923 Personal history of irradiation: Secondary | ICD-10-CM | POA: Diagnosis not present

## 2021-09-24 DIAGNOSIS — Z95828 Presence of other vascular implants and grafts: Secondary | ICD-10-CM

## 2021-09-24 DIAGNOSIS — R110 Nausea: Secondary | ICD-10-CM | POA: Diagnosis not present

## 2021-09-24 DIAGNOSIS — C7951 Secondary malignant neoplasm of bone: Secondary | ICD-10-CM | POA: Diagnosis not present

## 2021-09-24 LAB — CBC WITH DIFFERENTIAL (CANCER CENTER ONLY)
Abs Immature Granulocytes: 0.1 10*3/uL — ABNORMAL HIGH (ref 0.00–0.07)
Basophils Absolute: 0 10*3/uL (ref 0.0–0.1)
Basophils Relative: 1 %
Eosinophils Absolute: 0.1 10*3/uL (ref 0.0–0.5)
Eosinophils Relative: 1 %
HCT: 32.3 % — ABNORMAL LOW (ref 36.0–46.0)
Hemoglobin: 10.9 g/dL — ABNORMAL LOW (ref 12.0–15.0)
Immature Granulocytes: 1 %
Lymphocytes Relative: 18 %
Lymphs Abs: 1.4 10*3/uL (ref 0.7–4.0)
MCH: 35 pg — ABNORMAL HIGH (ref 26.0–34.0)
MCHC: 33.7 g/dL (ref 30.0–36.0)
MCV: 103.9 fL — ABNORMAL HIGH (ref 80.0–100.0)
Monocytes Absolute: 0.6 10*3/uL (ref 0.1–1.0)
Monocytes Relative: 8 %
Neutro Abs: 5.5 10*3/uL (ref 1.7–7.7)
Neutrophils Relative %: 71 %
Platelet Count: 182 10*3/uL (ref 150–400)
RBC: 3.11 MIL/uL — ABNORMAL LOW (ref 3.87–5.11)
RDW: 16.8 % — ABNORMAL HIGH (ref 11.5–15.5)
WBC Count: 7.7 10*3/uL (ref 4.0–10.5)
nRBC: 0 % (ref 0.0–0.2)

## 2021-09-24 LAB — CMP (CANCER CENTER ONLY)
ALT: 82 U/L — ABNORMAL HIGH (ref 0–44)
AST: 52 U/L — ABNORMAL HIGH (ref 15–41)
Albumin: 4.3 g/dL (ref 3.5–5.0)
Alkaline Phosphatase: 136 U/L — ABNORMAL HIGH (ref 38–126)
Anion gap: 10 (ref 5–15)
BUN: 13 mg/dL (ref 6–20)
CO2: 26 mmol/L (ref 22–32)
Calcium: 9.4 mg/dL (ref 8.9–10.3)
Chloride: 103 mmol/L (ref 98–111)
Creatinine: 0.98 mg/dL (ref 0.44–1.00)
GFR, Estimated: >60 mL/min (ref >60)
Glucose, Bld: 109 mg/dL — ABNORMAL HIGH (ref 70–99)
Potassium: 3.5 mmol/L (ref 3.5–5.1)
Sodium: 139 mmol/L (ref 135–145)
Total Bilirubin: 0.7 mg/dL (ref 0.3–1.2)
Total Protein: 8 g/dL (ref 6.5–8.1)

## 2021-09-24 MED ORDER — FAMOTIDINE 20 MG IN NS 100 ML IVPB: 20.0000 mg | Freq: Once | INTRAVENOUS | Status: DC

## 2021-09-24 MED ORDER — TRASTUZUMAB-DKST CHEMO 150 MG IV SOLR
6.0000 mg/kg | Freq: Once | INTRAVENOUS | Status: AC
  Administered 2021-09-24: 336 mg via INTRAVENOUS
  Filled 2021-09-24: qty 16

## 2021-09-24 MED ORDER — PROMETHAZINE HCL 25 MG RE SUPP: 25.0000 mg | Freq: Four times a day (QID) | RECTAL | 0 refills | Status: DC | PRN

## 2021-09-24 MED ORDER — SODIUM CHLORIDE 0.9 % IV SOLN: INTRAVENOUS | Status: DC

## 2021-09-24 MED ORDER — FAMOTIDINE 20 MG IN NS 100 ML IVPB
20.0000 mg | Freq: Once | INTRAVENOUS | Status: AC
  Administered 2021-09-24: 20 mg via INTRAVENOUS
  Filled 2021-09-24: qty 100

## 2021-09-24 MED ORDER — DIPHENHYDRAMINE HCL 25 MG PO CAPS
50.0000 mg | ORAL_CAPSULE | Freq: Once | ORAL | Status: AC
  Administered 2021-09-24: 50 mg via ORAL
  Filled 2021-09-24: qty 2

## 2021-09-24 MED ORDER — DENOSUMAB 120 MG/1.7ML ~~LOC~~ SOLN
120.0000 mg | Freq: Once | SUBCUTANEOUS | Status: AC
  Administered 2021-09-24: 120 mg via SUBCUTANEOUS
  Filled 2021-09-24: qty 1.7

## 2021-09-24 MED ORDER — SODIUM CHLORIDE 0.9% FLUSH
10.0000 mL | INTRAVENOUS | Status: DC | PRN
  Administered 2021-09-24: 10 mL

## 2021-09-24 MED ORDER — ACETAMINOPHEN 325 MG PO TABS
650.0000 mg | ORAL_TABLET | Freq: Once | ORAL | Status: AC
  Administered 2021-09-24: 650 mg via ORAL
  Filled 2021-09-24: qty 2

## 2021-09-24 MED ORDER — SODIUM CHLORIDE 0.9 % IV SOLN: Freq: Once | INTRAVENOUS | Status: AC

## 2021-09-24 MED ORDER — HEPARIN SOD (PORK) LOCK FLUSH 100 UNIT/ML IV SOLN
500.0000 [IU] | Freq: Once | INTRAVENOUS | Status: AC | PRN
  Administered 2021-09-24: 500 [IU]

## 2021-09-24 NOTE — Progress Notes (Signed)
Ok to change Trastuzumab dose per Dr. [name].  Pt has lost weight.  Kennith Center, Pharm.D., CPP 09/24/2021@9 :56 AM

## 2021-09-24 NOTE — Progress Notes (Signed)
Patient Care Team: [name], NP as PCP - General (Nurse Practitioner) Excell Seltzer, MD (Inactive) as Consulting Physician (General Surgery) [name], MD as Consulting Physician (Hematology and Oncology) Kyung Rudd, MD as Consulting Physician (Radiation Oncology) [name], MD as Consulting Physician (Gynecologic Oncology) Delice Bison, Charlestine Massed, NP as Nurse Practitioner (Hematology and Oncology) Mauro Kaufmann, RN as Oncology Nurse Navigator Rockwell Germany, RN as Oncology Nurse Navigator  DIAGNOSIS:  Encounter Diagnosis  Name Primary?   Malignant neoplasm of lower-outer quadrant of left breast of female, estrogen receptor negative ([name])     SUMMARY OF ONCOLOGIC HISTORY: Oncology History  Malignant neoplasm of lower-outer quadrant of left breast of female, estrogen receptor negative ([name])  05/11/2017 Initial Diagnosis   Left breast biopsy 3:30 position 6 cm from nipple: IDC with DCIS, lymphovascular invasion present, grade 2-3, ER 0%, PR 0%, HER-2 positive ratio 2.28, Ki-67 20%, 1 cm lesion left breast, T1b N0 stage IA clinical stage   06/09/2017 Surgery   Left lumpectomy: IDC grade 3, 1.2 cm, DCIS is present, margins negative, 0/6 lymph nodes negative, ER 0%, PR 0%, HER-2 positive ratio 2.28, Ki-67 20%, T1 BN 0 stage IA   07/09/2017 - 09/29/2017 Adjuvant Chemotherapy   Taxol/Herceptin x 12 completed on 09/29/2017, went on to receive every 3 week adjuvant Herceptin through 06/28/2018   11/25/2017 - 01/07/2018 Radiation Therapy    The patient initially received a dose of 50.4 Gy in 28 fractions to the breast using whole-breast tangent fields. This was delivered using a 3-D conformal technique. The patient then received a boost to the seroma. This delivered an additional 10 Gy in 5 fractions using a 3-D technique. The total dose was 60.4 Gy.   02/15/2018 Genetic Testing   The Common Hereditary Cancer Panel offered by [facility] includes sequencing and/or deletion  duplication testing of the following 47 genes: APC, ATM, AXIN2, BARD1, BMPR1A, BRCA1, BRCA2, BRIP1, CDH1, CDKN2A (p14ARF), CDKN2A (p16INK4a), CKD4, CHEK2, CTNNA1, DICER1, EPCAM (Deletion/duplication testing only), GREM1 (promoter region deletion/duplication testing only), KIT, MEN1, MLH1, MSH2, MSH3, MSH6, MUTYH, NBN, NF1, NHTL1, PALB2, PDGFRA, PMS2, POLD1, POLE, PTEN, RAD50, RAD51C, RAD51D, SDHB, SDHC, SDHD, SMAD4, SMARCA4. STK11, TP53, TSC1, TSC2, and VHL.  The following genes were evaluated for sequence changes only: SDHA and HOXB13 c.251G>A variant only.   Results: POSITIVE. 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 The date of this test report is 02/15/2018   10/20/2019 PET scan   Bone mets involving Right hip, Bil Iliac bones, T8, T9, L2 and L3, Rt prox humerus, Righ glenoid. Some lytic and some sclerotic. Mild Left supraclav adenopathy, 2 small lesions basal ganglia (could be lacunar infarcts)   11/14/2019 - 12/26/2019 Chemotherapy   The patient had palonosetron (ALOXI) injection 0.25 mg, 0.25 mg, Intravenous,  Once, 2 of 5 cycles Administration: 0.25 mg (12/05/2019), 0.25 mg (12/26/2019) ado-trastuzumab emtansine (KADCYLA) 260 mg in sodium chloride 0.9 % 250 mL chemo infusion, 3.6 mg/kg = 260 mg, Intravenous, Once, 3 of 6 cycles Administration: 260 mg (11/14/2019), 260 mg (12/05/2019), 260 mg (12/26/2019)   for chemotherapy treatment.     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  Required TPN briefly.  Patient was able to swallow and keep food down by the time of discharge.   02/06/2020 Miscellaneous   Talazoparib   07/15/2021 - 07/15/2021 Chemotherapy   Patient is on Treatment Plan :  Intrathecal Methotrexate     08/14/2021 -  Chemotherapy   Patient is on Treatment Plan : BREAST Capecitabine + Trastuzumab + Tucatinib q21d      Fallopian tube cancer, carcinoma, right ([place])  11/04/2017 Surgery   Uterus, cervix and bilateral fallopian tubes: Fallopian tube with serous carcinoma 0.6 cm positive for p53, PAX8, WT-1, MOC-31, cytokeratin 7, and estrogen receptor   01/11/2018 -  Chemotherapy   Taxol and carboplatin every 3 weeks (Taxol discontinued with cycle 2 because of profound rash)   10/20/2019 PET scan   Bone mets involving Right hip, Bil Iliac bones, T8, T9, L2 and L3, Rt prox humerus, Righ glenoid. Some lytic and some sclerotic. Mild Left supraclav adenopathy, 2 small lesions basal ganglia (could be lacunar infarcts)   11/14/2019 - 12/26/2019 Chemotherapy   The patient had palonosetron (ALOXI) injection 0.25 mg, 0.25 mg, Intravenous,  Once, 2 of 5 cycles Administration: 0.25 mg (12/05/2019), 0.25 mg (12/26/2019) ado-trastuzumab emtansine (KADCYLA) 260 mg in sodium chloride 0.9 % 250 mL chemo infusion, 3.6 mg/kg = 260 mg, Intravenous, Once, 3 of 6 cycles Administration: 260 mg (11/14/2019), 260 mg (12/05/2019), 260 mg (12/26/2019)   for chemotherapy treatment.     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 Over the past week she has had intractable nausea and emesis and was not able to keep anything down.  She has not been eating or drinking much.  She gets also severe acid reflux symptoms.  Yesterday her  Xeloda and Tucatinib were discontinued.  So far she has not noticed any significant difference.   ALLERGIES:  has No Known Allergies.  MEDICATIONS:  Current Outpatient Medications  Medication Sig Dispense Refill   Accu-Chek FastClix Lancets MISC Use as instructed. Inject into the skin twice daily 100 each 3   atorvastatin (LIPITOR) 40 MG tablet Take 1 tablet (40 mg total) by mouth daily. 90 tablet 1   Blood Glucose Calibration (ACCU-CHEK GUIDE CONTROL) LIQD 1 each by In Vitro route once as needed for up to 1 dose. 1 each 0   capecitabine (XELODA) 500 MG tablet Take 2 tablets (1,000 mg total) by mouth 2 (two) times daily after a meal. Take for 14 days on and 7 days off, repeat every 21 days. 56 tablet 3   CINNAMON PO Take 2,400 mg by mouth every evening. 1200 mg per cap     diclofenac Sodium (VOLTAREN) 1 % GEL Apply 1 application topically daily as needed (pain).     glucose blood (ACCU-CHEK GUIDE) test strip Use as instructed. Check blood glucose by fingerstick twice per day. 100 each 12   lidocaine-prilocaine (EMLA) cream Apply 1 application topically as needed. (Patient not taking: Reported on 08/21/2021) 30 g 5   lidocaine-prilocaine (EMLA) cream Apply to affected area once (Patient taking differently: Apply 1 application topically daily as needed. Apply to affected area once) 30 g 3  loperamide (IMODIUM) 2 MG capsule Take 2 mg by mouth as needed for diarrhea or loose stools. (Patient not taking: Reported on 08/20/2021)     memantine (NAMENDA) 10 MG tablet Take 1 tablet (10 mg total) by mouth 2 (two) times daily. (Patient not taking: Reported on 08/21/2021) 60 tablet 4   memantine (NAMENDA) 5 MG tablet Begin this prescription the first day of brain radiation. Week 1: take one tablet po qam. Week 2: take one tablet qam and qpm. Week 3: take two tablets qam, and one tablet po q pm. Week 4: take two tablets qam and qpm. Fill subsequent prescription q month. (Patient taking differently: Take 5-10 mg  by mouth See admin instructions. Begin this prescription the first day of brain radiation. Week 1: take one tablet po qam. Week 2: take one tablet qam and qpm. Week 3: take two tablets qam, and one tablet po q pm. Week 4: take two tablets qam and qpm. Fill subsequent prescription q month.) 70 tablet 0   Multiple Vitamins-Minerals (MULTIVITAMIN ADULT) CHEW Chew 2 each by mouth daily.     ondansetron (ZOFRAN ODT) 4 MG disintegrating tablet Take 1 tablet (4 mg total) by mouth every 8 (eight) hours as needed for nausea or vomiting. (Patient not taking: Reported on 08/21/2021) 30 tablet 0   ondansetron (ZOFRAN) 8 MG tablet Take 1 tablet (8 mg total) by mouth 2 (two) times daily as needed (Nausea or vomiting). 30 tablet 1   prochlorperazine (COMPAZINE) 10 MG tablet Take 1 tablet (10 mg total) by mouth every 6 (six) hours as needed (Nausea or vomiting). (Patient not taking: Reported on 08/21/2021) 30 tablet 1   tucatinib (TUKYSA) 150 MG tablet Take 2 tablets (300 mg total) by mouth 2 (two) times daily. Take every 12 hrs at the same time each day with or without a meal. 120 tablet 6   No current facility-administered medications for this visit.   Facility-Administered Medications Ordered in Other Visits  Medication Dose Route Frequency Provider Last Rate Last Admin   heparin lock flush 100 unit/mL  500 Units Intracatheter Once PRN [name], MD       sodium chloride flush (NS) 0.9 % injection 10 mL  10 mL Intracatheter PRN [name], MD        PHYSICAL EXAMINATION: ECOG PERFORMANCE STATUS: 1 - Symptomatic but completely ambulatory  Vitals:   09/24/21 0857  BP: 125/70  Pulse: 98  Resp: 17  Temp: 97.9 F (36.6 C)  SpO2: 98%   Filed Weights   09/24/21 0857  Weight: 124 lb 12.8 oz (56.6 kg)     LABORATORY DATA:  I have reviewed the data as listed CMP Latest Ref Rng & Units 09/24/2021 09/16/2021 09/03/2021  Glucose 70 - 99 mg/dL 109(H) 132(H) 113(H)  BUN 6 - 20 mg/dL _0 Creatinine  0.44 - 1.00 mg/dL 0.98 0.90 0.69  Sodium 135 - 145 mmol/L 139 137 140  Potassium 3.5 - 5.1 mmol/L 3.5 4.2 3.6  Chloride 98 - 111 mmol/L 103 107 104  CO2 22 - 32 mmol/L _1 Calcium 8.9 - 10.3 mg/dL 9.4 9.0 9.0  Total Protein 6.5 - 8.1 g/dL 8.0 8.4(H) 7.3  Total Bilirubin 0.3 - 1.2 mg/dL 0.7 0.8 0.6  Alkaline Phos 38 - 126 U/L 136(H) 162(H) 131(H)  AST 15 - 41 U/L 52(H) 45(H) 31  ALT 0 - 44 U/L 82(H) 79(H) 126(H)    Lab Results  Component Value Date  WBC 7.7 09/24/2021   HGB 10.9 (L) 09/24/2021   HCT 32.3 (L) 09/24/2021   MCV 103.9 (H) 09/24/2021   PLT 182 09/24/2021   NEUTROABS 5.5 09/24/2021    ASSESSMENT & PLAN:  Malignant neoplasm of lower-outer quadrant of left breast of female, estrogen receptor negative ([facility]) Left lumpectomy: IDC grade 3, 1.2 cm, DCIS is present, margins negative, 0/6 lymph nodes negative, ER 0%, PR 0%, HER-2 positive ratio 2.28, Ki-67 20%, T1 BN 0 stage IA S/P Taxol-Herceptin BRCA 2 Mutation Positive   10/20/19: Bone mets involving Right hip, Bil Iliac bones, T8, T9, L2 and L3, Rt prox humerus, Righ glenoid. Some lytic and some sclerotic. Mild Left supraclav adenopathy, 2 small lesions basal ganglia (could be lacunar infarcts)   Biopsy the supra clav LN: 10/31/2019: Metastatic breast cancer, ER/PR negative HER-2 equivocal by IHC, FISH positive Treatment summary: Kadcyla every 3-week treatment.  Discontinued for progression   MRI of the back 01/07/2020: Extensive metastatic disease to the involved spine.  Tumor replacement of L3 vertebral body with pathologic compression fracture, epidural and extraosseous extension.  Tumor extending throughout L3 foramen Status post palliative radiation therapy Causing severe dysphagia and dehydration and hospitalization 01/29/2020-02/03/2020 Talazoparib started 02/07/2020  CT CAP 09/20/19: No evidence of ST mets, tiny inferior right hepatic lesion substantially decreased (3 mm), bone mets have become sclerotic suggest  healing CT CAP 12/26/2020: Stable diffuse bone mets CT CAP 07/01/2021: Stable bone metastases.  New liver lesion 1.5 cm Brain MRI 07/12/2021: Abnormal cerebellar signal suspicious for leptomeningeal spread. Lumbar puncture 07/15/2021: Highly suspicious for leptomeningeal carcinomatosis  08/12/2021 through 08/25/2021 whole brain radiation therapy was completed Tucatinib plus Xeloda plus Herceptin based on HER-2 Climb study beginning 12 1, 2022. Liver ablation was completed on August 27, 2021 ------------------------------------------------------------------------------------------------------------------------------------------------------ Echocardiogram  Current treatment: Tucatinib, capecitabine, trastuzumab (xeloda Tucatinib held for nausea and diarrhea) Toxicities: Profound Nausea: With severe acid reflux: She is not able to take any oral medication.  I sent prescription for Phenergan to be used as a suppository.  We will give her IV fluids along with Herceptin today.  We will also give her Pepcid 20 mg IV along with her fluids. She will need to return on Friday and Saturday this week for IV fluids as well as Monday Wednesday Friday next week. With each of these fluids she will get 20 mg of Pepcid IV.  I would like to reassess her on 10/02/2020.  If the symptoms improve then we can consider restarting Tucatinib. If the symptoms do not improve then we may have to attribute some of the symptoms to the leptomeningeal disease. We will discontinue Xeloda at this time.  She did have a slight elevation in her LFTs.  This is likely due to the liver ablation that she underwent on August 27, 2021.   Return to clinic on 10/02/2021   No orders of the defined types were placed in this encounter.  The patient has a good understanding of the overall plan. she agrees with it. she will call with any problems that may develop before the next visit here. Total time spent: 30 mins including face to face  time and time spent for planning, charting and co-ordination of care   Harriette Ohara, MD 09/24/21

## 2021-09-24 NOTE — Progress Notes (Signed)
Per Dr. [name] - patient to have pepcid with IVF appointment.

## 2021-09-24 NOTE — Patient Instructions (Signed)
[facility]]   Discharge Instructions: Thank you for choosing [facility] to provide your oncology and hematology care.   If you have a lab appointment with the Cancer Center, please go directly to the Cancer Center and check in at the registration area.   Wear comfortable clothing and clothing appropriate for easy access to any Portacath or PICC line.   We strive to give you quality time with your provider. You may need to reschedule your appointment if you arrive late (15 or more minutes).  Arriving late affects you and other patients whose appointments are after yours.  Also, if you miss three or more appointments without notifying the office, you may be dismissed from the clinic at the provider's discretion.      For prescription refill requests, have your pharmacy contact our office and allow 72 hours for refills to be completed.    Today you received the following chemotherapy and/or immunotherapy agents: Trastuzumab (Herceptin)       To help prevent nausea and vomiting after your treatment, we encourage you to take your nausea medication as directed.  BELOW ARE SYMPTOMS THAT SHOULD BE REPORTED IMMEDIATELY: *FEVER GREATER THAN 100.4 F (38 C) OR HIGHER *CHILLS OR SWEATING *NAUSEA AND VOMITING THAT IS NOT CONTROLLED WITH YOUR NAUSEA MEDICATION *UNUSUAL SHORTNESS OF BREATH *UNUSUAL BRUISING OR BLEEDING *URINARY PROBLEMS (pain or burning when urinating, or frequent urination) *BOWEL PROBLEMS (unusual diarrhea, constipation, pain near the anus) TENDERNESS IN MOUTH AND THROAT WITH OR WITHOUT PRESENCE OF ULCERS (sore throat, sores in mouth, or a toothache) UNUSUAL RASH, SWELLING OR PAIN  UNUSUAL VAGINAL DISCHARGE OR ITCHING   Items with * indicate a potential emergency and should be followed up as soon as possible or go to the Emergency Department if any problems should occur.  Please show the CHEMOTHERAPY ALERT CARD or IMMUNOTHERAPY ALERT  CARD at check-in to the Emergency Department and triage nurse.  Should you have questions after your visit or need to cancel or reschedule your appointment, please contact [facility]  Dept: [phone]  and follow the prompts.  Office hours are 8:00 a.m. to 4:30 p.m. Monday - Friday. Please note that voicemails left after 4:00 p.m. may not be returned until the following business day.  We are closed weekends and major holidays. You have access to a nurse at all times for urgent questions. Please call the main number to the clinic Dept: [phone] and follow the prompts.   For any non-urgent questions, you may also contact your provider using MyChart. We now offer e-Visits for anyone 18 and older to request care online for non-urgent symptoms. For details visit [email or web address].   Also download the MyChart app! Go to the app store, search "MyChart", open the app, select [facility], and log in with your MyChart username and password.  Due to Covid, a mask is required upon entering the hospital/clinic. If you do not have a mask, one will be given to you upon arrival. For doctor visits, patients may have 1 support person aged 18 or older with them. For treatment visits, patients cannot have anyone with them due to current Covid guidelines and our immunocompromised population.  

## 2021-09-25 NOTE — Progress Notes (Signed)
Radiation Oncology         [phone] ________________________________  Name: Erin [name] MRN: [id number]  Date of Service: 09/22/2021  DOB: December 07, 1960  Post Treatment Telephone Note  Diagnosis:   Recurrent Metatastatic Stage IA, pT1cN0M0 grade 3, ER/PR negative, HER2 amplified, invasive ductal carcinoma with DCIS of the left breast  Intent: Palliative  Radiation Treatment Dates: 08/12/2021 through 08/25/2021 Site Technique Total Dose (Gy) Dose per Fx (Gy) Completed Fx Beam Energies  Brain: Whole Brain Complex 30/30 3 10/10 6X   Narrative: The patient tolerated radiation therapy relatively well. She was started on Namenda as well. Of note she was seen by Dr. [name] this week and has been struggling with oral intake and nausea. She reports her symptoms are slightly better but she still vomited up what she had eaten early today. She reports feeling slightly off balance but no worse and no better since radiation.    Impression/Plan: 1. Recurrent Metatastatic Stage IA, pT1cN0M0 grade 3, ER/PR negative, HER2 amplified, invasive ductal carcinoma with DCIS of the left breast. The patient has been doing well since completion of radiotherapy. We will follow her course and if she resumes treatment would consider following her with MRI of the brain in 2 months. She will also continue to follow up with Dr. [name] in medical oncology.        Carola Rhine, PAC

## 2021-09-26 ENCOUNTER — Inpatient Hospital Stay: Payer: Medicaid Other

## 2021-09-26 ENCOUNTER — Other Ambulatory Visit: Payer: Self-pay

## 2021-09-26 VITALS — BP 110/56 | HR 96 | Temp 98.3°F | Resp 16 | Ht 61.0 in | Wt 126.4 lb

## 2021-09-26 DIAGNOSIS — Z5112 Encounter for antineoplastic immunotherapy: Secondary | ICD-10-CM | POA: Diagnosis not present

## 2021-09-26 DIAGNOSIS — Z95828 Presence of other vascular implants and grafts: Secondary | ICD-10-CM

## 2021-09-26 MED ORDER — FAMOTIDINE 20 MG IN NS 100 ML IVPB
20.0000 mg | Freq: Once | INTRAVENOUS | Status: AC
  Administered 2021-09-26: 20 mg via INTRAVENOUS
  Filled 2021-09-26: qty 100

## 2021-09-26 MED ORDER — SODIUM CHLORIDE 0.9 % IV SOLN: Freq: Once | INTRAVENOUS | Status: AC

## 2021-09-26 MED ORDER — HEPARIN SOD (PORK) LOCK FLUSH 100 UNIT/ML IV SOLN
500.0000 [IU] | Freq: Once | INTRAVENOUS | Status: AC | PRN
  Administered 2021-09-26: 500 [IU]

## 2021-09-26 MED ORDER — SODIUM CHLORIDE 0.9% FLUSH
10.0000 mL | INTRAVENOUS | Status: DC | PRN
  Administered 2021-09-26: 10 mL

## 2021-09-26 NOTE — Patient Instructions (Signed)

## 2021-09-27 ENCOUNTER — Other Ambulatory Visit: Payer: Self-pay

## 2021-09-27 ENCOUNTER — Inpatient Hospital Stay: Payer: Medicaid Other

## 2021-09-27 VITALS — BP 115/64 | HR 77 | Temp 98.5°F | Resp 18

## 2021-09-27 DIAGNOSIS — Z5112 Encounter for antineoplastic immunotherapy: Secondary | ICD-10-CM | POA: Diagnosis not present

## 2021-09-27 DIAGNOSIS — Z95828 Presence of other vascular implants and grafts: Secondary | ICD-10-CM

## 2021-09-27 DIAGNOSIS — C50512 Malignant neoplasm of lower-outer quadrant of left female breast: Secondary | ICD-10-CM

## 2021-09-27 MED ORDER — SODIUM CHLORIDE 0.9 % IV SOLN: Freq: Once | INTRAVENOUS | Status: AC

## 2021-09-27 MED ORDER — HEPARIN SOD (PORK) LOCK FLUSH 100 UNIT/ML IV SOLN
500.0000 [IU] | Freq: Once | INTRAVENOUS | Status: AC | PRN
  Administered 2021-09-27: 500 [IU]

## 2021-09-27 MED ORDER — FAMOTIDINE 20 MG IN NS 100 ML IVPB: 20.0000 mg | Freq: Once | INTRAVENOUS | Status: DC

## 2021-09-27 MED ORDER — FAMOTIDINE 20 MG IN NS 100 ML IVPB
20.0000 mg | Freq: Once | INTRAVENOUS | Status: AC
  Administered 2021-09-27: 20 mg via INTRAVENOUS

## 2021-09-27 MED ORDER — SODIUM CHLORIDE 0.9% FLUSH
10.0000 mL | INTRAVENOUS | Status: DC | PRN
  Administered 2021-09-27: 10 mL

## 2021-09-28 ENCOUNTER — Other Ambulatory Visit: Payer: Self-pay | Admitting: Radiation Oncology

## 2021-09-29 ENCOUNTER — Encounter: Payer: Self-pay | Admitting: Hematology and Oncology

## 2021-09-29 ENCOUNTER — Other Ambulatory Visit: Payer: Self-pay

## 2021-09-29 ENCOUNTER — Other Ambulatory Visit: Payer: Self-pay | Admitting: Family Medicine

## 2021-09-29 ENCOUNTER — Inpatient Hospital Stay: Payer: Medicaid Other

## 2021-09-29 ENCOUNTER — Telehealth: Payer: Self-pay

## 2021-09-29 VITALS — BP 112/64 | HR 79 | Temp 98.4°F | Resp 16 | Wt 127.5 lb

## 2021-09-29 DIAGNOSIS — E785 Hyperlipidemia, unspecified: Secondary | ICD-10-CM

## 2021-09-29 DIAGNOSIS — E1169 Type 2 diabetes mellitus with other specified complication: Secondary | ICD-10-CM

## 2021-09-29 DIAGNOSIS — Z5112 Encounter for antineoplastic immunotherapy: Secondary | ICD-10-CM | POA: Diagnosis not present

## 2021-09-29 DIAGNOSIS — Z95828 Presence of other vascular implants and grafts: Secondary | ICD-10-CM

## 2021-09-29 MED ORDER — FAMOTIDINE 20 MG IN NS 100 ML IVPB
20.0000 mg | Freq: Once | INTRAVENOUS | Status: AC
  Administered 2021-09-29: 20 mg via INTRAVENOUS
  Filled 2021-09-29: qty 100

## 2021-09-29 MED ORDER — SODIUM CHLORIDE 0.9% FLUSH
10.0000 mL | Freq: Once | INTRAVENOUS | Status: AC | PRN
  Administered 2021-09-29: 10 mL

## 2021-09-29 MED ORDER — HEPARIN SOD (PORK) LOCK FLUSH 100 UNIT/ML IV SOLN
500.0000 [IU] | Freq: Once | INTRAVENOUS | Status: AC | PRN
  Administered 2021-09-29: 500 [IU]

## 2021-09-29 MED ORDER — SODIUM CHLORIDE 0.9 % IV SOLN: Freq: Once | INTRAVENOUS | Status: AC

## 2021-09-29 NOTE — Patient Instructions (Signed)

## 2021-09-29 NOTE — Telephone Encounter (Addendum)
Per Shona Simpson PA-C I called patient w/ no answer but left a voicemail w/ text through interpreter service, regarding if she picked up her Namenda 10mg  RX...  I also called patient's pharmacy to double check this information and they report that patient has not gotten this RX filled. They will fill it now and notify patient to come pick up the RX.  [id number] [address], [place], [place] [address]  Phone:  [phone]  Fax:  [phone]

## 2021-09-30 ENCOUNTER — Ambulatory Visit
Admission: RE | Admit: 2021-09-30 | Discharge: 2021-09-30 | Disposition: A | Discharge status: Home / Self Care | Payer: Medicaid Other | Source: Ambulatory Visit | Attending: Physician Assistant | Admitting: Physician Assistant

## 2021-09-30 ENCOUNTER — Other Ambulatory Visit (HOSPITAL_COMMUNITY): Payer: Self-pay

## 2021-09-30 ENCOUNTER — Encounter: Payer: Self-pay | Admitting: *Deleted

## 2021-09-30 ENCOUNTER — Encounter: Payer: Self-pay | Admitting: Hematology and Oncology

## 2021-09-30 DIAGNOSIS — C50919 Malignant neoplasm of unspecified site of unspecified female breast: Secondary | ICD-10-CM

## 2021-09-30 HISTORY — PX: IR RADIOLOGIST EVAL & MGMT: IMG5224

## 2021-09-30 NOTE — Progress Notes (Signed)
Chief Complaint: Patient was seen in consultation today for follow up status post thermal ablation of the liver on 08/27/2021.  History of Present Illness: Erin [name] is a 61 y.o. female who is status post percutaneous thermal ablation of a metastatic lesion in the right lobe of the liver measuring approximately 2.1 cm in greatest diameter on 08/27/2021.  She was discharged the same day of the procedure.  She did return to the Emergency Department on 09/16/2021 with complaint of abdominal pain and constipation.  A CT at that time demonstrates a well-circumscribed ablation defect in the right lobe of the liver centered at the level of the treated lesion with no evidence of complication.  No additional findings other than known bone metastases of the thoracolumbar spine and pelvis.  She has resumed treatment for metastatic breast carcinoma by Dr. [name] and is being treated for intractable nausea currently which has also affected her ability to eat and resulted in weight loss.    Past Medical History:  Diagnosis Date   Arthritis    knees, elbows   Borderline glaucoma of right eye    BRCA2 gene mutation positive 02/17/2018   BRCA2 c.4552del (p.Glu1518Asnfs*25)  Result reported out on 02/15/2018.    Breast cancer ([place]) 05/2017   left   Chronic back pain    Deaf    per pt born hearing and at age 25 1/2 lost hearing , was told by mother unknown cause, can miminally hear in left and no hearing on right    Depression    Elevated cancer antigen 125 (CA 125)    Family history of breast cancer    Family history of breast cancer    Family history of colon cancer    Genetic testing 02/17/2018   The Common Hereditary Cancer Panel offered by [facility] includes sequencing and/or deletion duplication testing of the following 47 genes: APC, ATM, AXIN2, BARD1, BMPR1A, BRCA1, BRCA2, BRIP1, CDH1, CDKN2A (p14ARF), CDKN2A (p16INK4a), CKD4, CHEK2, CTNNA1, DICER1, EPCAM (Deletion/duplication testing only),  GREM1 (promoter region deletion/duplication testing only), KIT, MEN1, MLH1, MSH2, MSH3, MSH6, MU   GERD (gastroesophageal reflux disease)    History of cancer chemotherapy    left breast cancer 09-08-2017 to 10-09-2017;  fallopion tube cancer  12-15-2017  to 06-28-2018   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 oncologist-  dr [name]   11-04-2017  s/p  LAVH w/ BSO,  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;  completed chemo 06-28-2018   History of external beam radiation therapy    left breast  11-25-2017  to 01-07-2018   Hyperlipidemia    Hypertension    followed by pcp   (10-05-2019  per pt never had stress test)   IDA (iron deficiency anemia)    Malignant neoplasm of lower-outer quadrant of left breast of female, estrogen receptor negative [facility]) oncologist-- dr [name]   dx 08/ 2018--- Stage IA, DCIS,  ER/ PR negative,  HER-2 positive;  06-09-2017 s/p left breast lumpectomy with node dissection;  completed chemo 10-09-2017  and radiation 01-07-2018/  hercepton completed 06-28-2018   Non-insulin dependent type 2 diabetes mellitus ([place])    followed by pcp   (10-05-2019 per pt check cbg every other day in AM,  fasting cbg-- 105)   Numbness of right thumb    Wears glasses     Past Surgical History:  Procedure Laterality Date   BREAST LUMPECTOMY WITH RADIOACTIVE SEED AND SENTINEL LYMPH NODE BIOPSY  Left 06/09/2017   Procedure: BREAST LUMPECTOMY WITH RADIOACTIVE SEED AND SENTINEL LYMPH NODE BIOPSY;  Surgeon: Excell Seltzer, MD;  Location: [facility];  Service: General;  Laterality: Left;   COLONOSCOPY  11/02/2017   polyps   HERNIA REPAIR     HIATAL HERNIA REPAIR  09/2019   IR RADIOLOGIST EVAL & MGMT  07/09/2021   IR RADIOLOGIST EVAL & MGMT  07/24/2021   IR RADIOLOGIST EVAL & MGMT  09/30/2021   LAPAROSCOPIC ASSISTED VAGINAL HYSTERECTOMY N/A 11/04/2017   Procedure: LAPAROSCOPIC ASSISTED VAGINAL HYSTERECTOMY;  Surgeon: [name], MD;  Location: [facility];  Service: Gynecology;  Laterality: N/A;   LAPAROSCOPIC BILATERAL SALPINGO OOPHERECTOMY Bilateral 11/04/2017   Procedure: LAPAROSCOPIC BILATERAL SALPINGO OOPHORECTOMY;  Surgeon: [name], MD;  Location: [facility];  Service: Gynecology;  Laterality: Bilateral;   LAPAROSCOPY N/A 10/09/2019   Procedure: LAPAROSCOPY DIAGNOSTIC WITH PERITONEAL BIOPSIES;  Surgeon: [name], MD;  Location: [facility];  Service: Gynecology;  Laterality: N/A;   PORTACATH PLACEMENT Right 06/09/2017   Procedure: INSERTION PORT-A-CATH WITH [place];  Surgeon: Excell Seltzer, MD;  Location: [facility];  Service: General;  Laterality: Right;   PORTACATH PLACEMENT Right 11/13/2019   Procedure: INSERTION PORT-A-CATH WITH ULTRASOUND GUIDANCE;  Surgeon: Rolm Bookbinder, MD;  Location: [facility];  Service: General;  Laterality: Right;   RADIOLOGY WITH ANESTHESIA N/A 08/27/2021   Procedure: IR WITH ANESTHESIA MICROWAVE ABLATION OF LIVER;  Surgeon: Aletta Edouard, MD;  Location: [facility];  Service: Radiology;  Laterality: N/A;   TUBAL LIGATION  02/02/2002   @[facility]    [facility]   UPPER GI ENDOSCOPY      Allergies: Patient has no known allergies.  Medications: Prior to Admission medications   Medication Sig Start Date End Date Taking? Authorizing Provider  Accu-Chek FastClix Lancets MISC Use as instructed. Inject into the skin twice daily 08/05/21   [name], NP  atorvastatin (LIPITOR) 40 MG tablet TAKE 1 TABLET BY MOUTH EVERY DAY 09/29/21   Dorna Mai, MD  Blood Glucose Calibration (ACCU-CHEK GUIDE CONTROL) LIQD 1 each by In Vitro route once as needed for up to 1 dose. 12/26/18   [name], NP  CINNAMON PO Take 2,400 mg by mouth every evening. 1200 mg per cap    [provider]  diclofenac Sodium (VOLTAREN) 1 % GEL Apply 1 application topically daily as needed (pain).    [provider]  glucose blood (ACCU-CHEK GUIDE) test strip  Use as instructed. Check blood glucose by fingerstick twice per day. 11/18/20   [name], NP  lidocaine-prilocaine (EMLA) cream Apply to affected area once Patient taking differently: Apply 1 application topically daily as needed. Apply to affected area once 07/30/21   [name], MD  loperamide (IMODIUM) 2 MG capsule Take 2 mg by mouth as needed for diarrhea or loose stools. Patient not taking: Reported on 08/20/2021    [name], MD  memantine (NAMENDA) 10 MG tablet Take 1 tablet (10 mg total) by mouth 2 (two) times daily. Patient not taking: Reported on 08/21/2021 07/30/21   Hayden Pedro, PA-C  Multiple Vitamins-Minerals (MULTIVITAMIN ADULT) CHEW Chew 2 each by mouth daily.    [provider]  ondansetron (ZOFRAN ODT) 4 MG disintegrating tablet Take 1 tablet (4 mg total) by mouth every 8 (eight) hours as needed for nausea or vomiting. Patient not taking: Reported on 08/21/2021 04/04/21   Scot Jun, FNP  ondansetron (ZOFRAN) 8 MG  tablet Take 1 tablet (8 mg total) by mouth 2 (two) times daily as needed (Nausea or vomiting). 07/30/21   [name], MD  prochlorperazine (COMPAZINE) 10 MG tablet Take 1 tablet (10 mg total) by mouth every 6 (six) hours as needed (Nausea or vomiting). Patient not taking: Reported on 08/21/2021 07/30/21   [name], MD  promethazine (PHENERGAN) 25 MG suppository Place 1 suppository (25 mg total) rectally every 6 (six) hours as needed for nausea or vomiting. 09/24/21   [name], MD  tucatinib (TUKYSA) 150 MG tablet Take 2 tablets (300 mg total) by mouth 2 (two) times daily. Take every 12 hrs at the same time each day with or without a meal. 07/30/21   [name], MD     Family History  Problem Relation Age of Onset   Hypertension Mother    Lupus Mother    Diabetes Father    Hypertension Sister    Diabetes Paternal Grandmother    Colon cancer Paternal Grandmother 79   CAD Brother    Diabetes Brother    Heart attack  Brother    Breast cancer Maternal Aunt        dx >50   Heart attack Paternal Grandfather    Breast cancer Maternal Aunt        dx under 62   Breast cancer Maternal Aunt        dx  under 14    Social History   Socioeconomic History   Marital status: Legally Separated    Spouse name: Not on file   Number of children: 2   Years of education: Not on file   Highest education level: Not on file  Occupational History   Not on file  Tobacco Use   Smoking status: Never   Smokeless tobacco: Never  Vaping Use   Vaping Use: Never used  Substance and Sexual Activity   Alcohol use: Not Currently    Comment: very rare   Drug use: Never   Sexual activity: Not Currently    Birth control/protection: Surgical  Other Topics Concern   Not on file  Social History Narrative   1 boy and 1 girl   Resides in [place]   Seperated   Social Determinants of Health   Financial Resource Strain: Not on file  Food Insecurity: Not on file  Transportation Needs: Not on file  Physical Activity: Not on file  Stress: Not on file  Social Connections: Not on file    ECOG Status: 1 - Symptomatic but completely ambulatory  Review of Systems: A 12 point ROS discussed and pertinent positives are indicated in the HPI above.  All other systems are negative.  Review of Systems  Constitutional:  Positive for appetite change.  Respiratory: Negative.    Cardiovascular: Negative.   Gastrointestinal:  Positive for nausea and vomiting.  Genitourinary: Negative.   Musculoskeletal:  Positive for gait problem.   Vital Signs: BP 134/72 (BP Location: Right Arm)    Pulse 87    LMP 07/09/2017 (LMP Unknown)    SpO2 97%   Physical Exam Vitals reviewed.  Abdominal:     General: There is no distension.     Palpations: Abdomen is soft.     Tenderness: There is no abdominal tenderness. There is no guarding or rebound.  Neurological:     General: No focal deficit present.     Mental Status: She is alert and  oriented to person, place, and time.     Imaging: CT  ABDOMEN PELVIS W CONTRAST  Result Date: 09/16/2021 CLINICAL DATA:  Metastatic breast cancer, recent liver ablation, abdominal pain, constipation EXAM: CT ABDOMEN AND PELVIS WITH CONTRAST TECHNIQUE: Multidetector CT imaging of the abdomen and pelvis was performed using the standard protocol following bolus administration of intravenous contrast. CONTRAST:  42m OMNIPAQUE IOHEXOL 350 MG/ML SOLN COMPARISON:  07/01/2021, 07/21/2021 FINDINGS: Lower chest: Minimal scarring or atelectasis within the lower lobes. No acute pleural or parenchymal lung disease. Hepatobiliary: Linear hypodensity within the right lobe liver, extending to a 3.7 x 2.8 cm rounded hypodensity, consistent with sequela of recent CT-guided ablation for metastatic breast cancer. The remainder of the liver is unremarkable. Gallbladder is normal. Pancreas: Unremarkable. No pancreatic ductal dilatation or surrounding inflammatory changes. Spleen: Normal in size without focal abnormality. Adrenals/Urinary Tract: Adrenal glands are unremarkable. Kidneys are normal, without renal calculi, focal lesion, or hydronephrosis. Bladder is only minimally distended, which limits evaluation. Stomach/Bowel: No bowel obstruction or ileus. No bowel wall thickening or inflammatory change. No significant fecal retention. Vascular/Lymphatic: No significant vascular findings are present. No enlarged abdominal or pelvic lymph nodes. Reproductive: Status post hysterectomy. No adnexal masses. Other: No free fluid or free gas. Fat containing supraumbilical ventral hernia unchanged. No bowel herniation. Musculoskeletal: Diffuse sclerotic lesions throughout the visualized axial and appendicular skeleton consistent with widespread bony metastases. Chronic L3 compression fracture. No acute fractures. Reconstructed images demonstrate no additional findings. IMPRESSION: 1. Postprocedural changes from right lobe liver ablation  for metastatic disease. No evidence of complication. 2. Stable widespread bony metastases. 3. No bowel obstruction or ileus.  No significant fecal retention. 4. Stable fat containing supraumbilical ventral hernia. Electronically Signed   By: MRanda NgoM.D.   On: 09/16/2021 21:47   IR Radiologist Eval & Mgmt  Result Date: 09/30/2021 Please refer to notes tab for details about interventional procedure. (Op Note)   Labs:  CBC: Recent Labs    08/27/21 0728 09/03/21 0841 09/16/21 1742 09/24/21 0816  WBC 5.8 6.6 6.6 7.7  HGB 10.2* 9.2* 11.0* 10.9*  HCT 31.1* 28.2* 34.0* 32.3*  PLT 166 151 237 182    COAGS: Recent Labs    08/22/21 1525  INR 1.0  APTT 29    BMP: Recent Labs    08/27/21 0728 09/03/21 0841 09/16/21 1742 09/24/21 0816  NA 137 140 137 139  K 3.3* 3.6 4.2 3.5  CL 101 104 107 103  CO2 25 28 27 26   GLUCOSE 122* 113* 132* 109*  BUN 12 10 11 13   CALCIUM 9.0 9.0 9.0 9.4  CREATININE 0.80 0.69 0.90 0.98  GFRNONAA >60 >60 >60 >60    LIVER FUNCTION TESTS: Recent Labs    08/27/21 0728 09/03/21 0841 09/16/21 1742 09/24/21 0816  BILITOT 0.7 0.6 0.8 0.7  AST 23 31 45* 52*  ALT 33 126* 79* 82*  ALKPHOS 64 131* 162* 136*  PROT 7.8 7.3 8.4* 8.0  ALBUMIN 4.0 3.8 4.2 4.3     Assessment and Plan:  Initial CT appearance of the ablation defect in the liver on 09/16/2021 appears appropriate and there is no evidence of complication following the procedure.  This scan was done earlier than typical post ablation because of some symptoms that required more emergent evaluation.  The scan does not show any obvious new metastatic disease in the liver.  Given appearance, additional imaging evaluation and staging could likely be deferred for another few months and she will be followed closely by Dr. GLindi Adieduring treatment.   Electronically Signed: GVenetia Night  [name] 09/30/2021, 3:30 PM    I spent a total of  10 Minutes in face to face in clinical consultation, greater  than 50% of which was counseling/coordinating care post thermal ablation of a liver metastasis.

## 2021-10-01 ENCOUNTER — Inpatient Hospital Stay: Payer: Medicaid Other

## 2021-10-01 ENCOUNTER — Other Ambulatory Visit: Payer: Self-pay

## 2021-10-01 VITALS — BP 118/64 | HR 86 | Temp 98.6°F

## 2021-10-01 DIAGNOSIS — Z5112 Encounter for antineoplastic immunotherapy: Secondary | ICD-10-CM | POA: Diagnosis not present

## 2021-10-01 DIAGNOSIS — Z95828 Presence of other vascular implants and grafts: Secondary | ICD-10-CM

## 2021-10-01 MED ORDER — SODIUM CHLORIDE 0.9 % IV SOLN: Freq: Once | INTRAVENOUS | Status: AC

## 2021-10-01 MED ORDER — METHYLPREDNISOLONE SODIUM SUCC 125 MG IJ SOLR: 125.0000 mg | Freq: Once | INTRAMUSCULAR | Status: DC | PRN

## 2021-10-01 MED ORDER — ALTEPLASE 2 MG IJ SOLR: 2.0000 mg | Freq: Once | INTRAMUSCULAR | Status: DC | PRN

## 2021-10-01 MED ORDER — EPINEPHRINE 1 MG/10ML IJ SOSY: 0.2500 mg | PREFILLED_SYRINGE | Freq: Once | INTRAMUSCULAR | Status: DC | PRN

## 2021-10-01 MED ORDER — SODIUM CHLORIDE 0.9% FLUSH
10.0000 mL | Freq: Once | INTRAVENOUS | Status: AC | PRN
  Administered 2021-10-01: 10 mL

## 2021-10-01 MED ORDER — DIPHENHYDRAMINE HCL 50 MG/ML IJ SOLN: 50.0000 mg | Freq: Once | INTRAMUSCULAR | Status: DC | PRN

## 2021-10-01 MED ORDER — DIPHENHYDRAMINE HCL 50 MG/ML IJ SOLN: 25.0000 mg | Freq: Once | INTRAMUSCULAR | Status: DC | PRN

## 2021-10-01 MED ORDER — FAMOTIDINE 20 MG IN NS 100 ML IVPB
20.0000 mg | Freq: Once | INTRAVENOUS | Status: AC
  Administered 2021-10-01: 20 mg via INTRAVENOUS
  Filled 2021-10-01: qty 100

## 2021-10-01 MED ORDER — HEPARIN SOD (PORK) LOCK FLUSH 100 UNIT/ML IV SOLN
500.0000 [IU] | Freq: Once | INTRAVENOUS | Status: AC | PRN
  Administered 2021-10-01: 500 [IU]

## 2021-10-01 MED ORDER — ALBUTEROL SULFATE (2.5 MG/3ML) 0.083% IN NEBU: 2.5000 mg | INHALATION_SOLUTION | Freq: Once | RESPIRATORY_TRACT | Status: DC | PRN

## 2021-10-01 MED ORDER — SODIUM CHLORIDE 0.9 % IV SOLN: Freq: Once | INTRAVENOUS | Status: DC | PRN

## 2021-10-01 NOTE — Progress Notes (Signed)
Patient Care Team: [name], NP as PCP - General (Nurse Practitioner) Excell Seltzer, MD (Inactive) as Consulting Physician (General Surgery) [name], MD as Consulting Physician (Hematology and Oncology) Kyung Rudd, MD as Consulting Physician (Radiation Oncology) [name], MD as Consulting Physician (Gynecologic Oncology) Delice Bison, Charlestine Massed, NP as Nurse Practitioner (Hematology and Oncology) Mauro Kaufmann, RN as Oncology Nurse Navigator Rockwell Germany, RN as Oncology Nurse Navigator  DIAGNOSIS:    ICD-10-CM   1. Malignant neoplasm of lower-outer quadrant of left breast of female, estrogen receptor negative ([place])  C50.512    Z17.1       SUMMARY OF ONCOLOGIC HISTORY: Oncology History  Malignant neoplasm of lower-outer quadrant of left breast of female, estrogen receptor negative ([place])  05/11/2017 Initial Diagnosis   Left breast biopsy 3:30 position 6 cm from nipple: IDC with DCIS, lymphovascular invasion present, grade 2-3, ER 0%, PR 0%, HER-2 positive ratio 2.28, Ki-67 20%, 1 cm lesion left breast, T1b N0 stage IA clinical stage   06/09/2017 Surgery   Left lumpectomy: IDC grade 3, 1.2 cm, DCIS is present, margins negative, 0/6 lymph nodes negative, ER 0%, PR 0%, HER-2 positive ratio 2.28, Ki-67 20%, T1 BN 0 stage IA   07/09/2017 - 09/29/2017 Adjuvant Chemotherapy   Taxol/Herceptin x 12 completed on 09/29/2017, went on to receive every 3 week adjuvant Herceptin through 06/28/2018   11/25/2017 - 01/07/2018 Radiation Therapy    The patient initially received a dose of 50.4 Gy in 28 fractions to the breast using whole-breast tangent fields. This was delivered using a 3-D conformal technique. The patient then received a boost to the seroma. This delivered an additional 10 Gy in 5 fractions using a 3-D technique. The total dose was 60.4 Gy.   02/15/2018 Genetic Testing   The Common Hereditary Cancer Panel offered by [facility] includes sequencing and/or deletion  duplication testing of the following 47 genes: APC, ATM, AXIN2, BARD1, BMPR1A, BRCA1, BRCA2, BRIP1, CDH1, CDKN2A (p14ARF), CDKN2A (p16INK4a), CKD4, CHEK2, CTNNA1, DICER1, EPCAM (Deletion/duplication testing only), GREM1 (promoter region deletion/duplication testing only), KIT, MEN1, MLH1, MSH2, MSH3, MSH6, MUTYH, NBN, NF1, NHTL1, PALB2, PDGFRA, PMS2, POLD1, POLE, PTEN, RAD50, RAD51C, RAD51D, SDHB, SDHC, SDHD, SMAD4, SMARCA4. STK11, TP53, TSC1, TSC2, and VHL.  The following genes were evaluated for sequence changes only: SDHA and HOXB13 c.251G>A variant only.   Results: POSITIVE. 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 The date of this test report is 02/15/2018   10/20/2019 PET scan   Bone mets involving Right hip, Bil Iliac bones, T8, T9, L2 and L3, Rt prox humerus, Righ glenoid. Some lytic and some sclerotic. Mild Left supraclav adenopathy, 2 small lesions basal ganglia (could be lacunar infarcts)   11/14/2019 - 12/26/2019 Chemotherapy   The patient had palonosetron (ALOXI) injection 0.25 mg, 0.25 mg, Intravenous,  Once, 2 of 5 cycles Administration: 0.25 mg (12/05/2019), 0.25 mg (12/26/2019) ado-trastuzumab emtansine (KADCYLA) 260 mg in sodium chloride 0.9 % 250 mL chemo infusion, 3.6 mg/kg = 260 mg, Intravenous, Once, 3 of 6 cycles Administration: 260 mg (11/14/2019), 260 mg (12/05/2019), 260 mg (12/26/2019)   for chemotherapy treatment.     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  Required TPN briefly.  Patient was able to swallow and keep food down by the time of discharge.   02/06/2020 Miscellaneous   Talazoparib   07/15/2021 - 07/15/2021 Chemotherapy  Patient is on Treatment Plan : Intrathecal Methotrexate     08/14/2021 -  Chemotherapy   Patient is on Treatment Plan : BREAST Capecitabine + Trastuzumab + Tucatinib q21d      Fallopian tube cancer, carcinoma, right ([place])  11/04/2017 Surgery   Uterus, cervix and bilateral fallopian tubes: Fallopian tube with serous carcinoma 0.6 cm positive for p53, PAX8, WT-1, MOC-31, cytokeratin 7, and estrogen receptor   01/11/2018 -  Chemotherapy   Taxol and carboplatin every 3 weeks (Taxol discontinued with cycle 2 because of profound rash)   10/20/2019 PET scan   Bone mets involving Right hip, Bil Iliac bones, T8, T9, L2 and L3, Rt prox humerus, Righ glenoid. Some lytic and some sclerotic. Mild Left supraclav adenopathy, 2 small lesions basal ganglia (could be lacunar infarcts)   11/14/2019 - 12/26/2019 Chemotherapy   The patient had palonosetron (ALOXI) injection 0.25 mg, 0.25 mg, Intravenous,  Once, 2 of 5 cycles Administration: 0.25 mg (12/05/2019), 0.25 mg (12/26/2019) ado-trastuzumab emtansine (KADCYLA) 260 mg in sodium chloride 0.9 % 250 mL chemo infusion, 3.6 mg/kg = 260 mg, Intravenous, Once, 3 of 6 cycles Administration: 260 mg (11/14/2019), 260 mg (12/05/2019), 260 mg (12/26/2019)   for chemotherapy treatment.     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 with above-mentioned history of metastatic breast cancer who had leptomeningeal disease and was started on Xeloda with to [facility] and Herceptin. She presents to the clinic today for follow-up.  We stopped Xeloda and Tucatinib couple of weeks ago and is here today to review how she is doing.  She tells me that the nausea has remarkably improved.  She still  gets 1-2 episodes of nausea daily.  Her weight is stable.  She is eating more food.  She denies any diarrhea or constipation.  ALLERGIES:  has No Known Allergies.  MEDICATIONS:  Current Outpatient Medications  Medication Sig Dispense Refill   Accu-Chek FastClix Lancets MISC Use as instructed. Inject into the skin twice daily 100 each 3   atorvastatin (LIPITOR) 40 MG tablet TAKE 1 TABLET BY MOUTH EVERY DAY 30 tablet 0   Blood Glucose Calibration (ACCU-CHEK GUIDE CONTROL) LIQD 1 each by In Vitro route once as needed for up to 1 dose. 1 each 0   CINNAMON PO Take 2,400 mg by mouth every evening. 1200 mg per cap     diclofenac Sodium (VOLTAREN) 1 % GEL Apply 1 application topically daily as needed (pain).     glucose blood (ACCU-CHEK GUIDE) test strip Use as instructed. Check blood glucose by fingerstick twice per day. 100 each 12   lidocaine-prilocaine (EMLA) cream Apply to affected area once (Patient taking differently: Apply 1 application topically daily as needed. Apply to affected area once) 30 g 3   loperamide (IMODIUM) 2 MG capsule Take 2 mg by mouth as needed for diarrhea or loose stools. (Patient not taking: Reported on 08/20/2021)     memantine (NAMENDA) 10 MG tablet Take 1 tablet (10 mg total) by mouth 2 (two)  times daily. (Patient not taking: Reported on 08/21/2021) 60 tablet 4   Multiple Vitamins-Minerals (MULTIVITAMIN ADULT) CHEW Chew 2 each by mouth daily.     ondansetron (ZOFRAN ODT) 4 MG disintegrating tablet Take 1 tablet (4 mg total) by mouth every 8 (eight) hours as needed for nausea or vomiting. (Patient not taking: Reported on 08/21/2021) 30 tablet 0   ondansetron (ZOFRAN) 8 MG tablet Take 1 tablet (8 mg total) by mouth 2 (two) times daily as needed (Nausea or vomiting). 30 tablet 1   prochlorperazine (COMPAZINE) 10 MG tablet Take 1 tablet (10 mg total) by mouth every 6 (six) hours as needed (Nausea or vomiting). (Patient not taking: Reported on 08/21/2021) 30 tablet 1   promethazine  (PHENERGAN) 25 MG suppository Place 1 suppository (25 mg total) rectally every 6 (six) hours as needed for nausea or vomiting. 30 each 0   tucatinib (TUKYSA) 150 MG tablet Take 2 tablets (300 mg total) by mouth 2 (two) times daily. Take every 12 hrs at the same time each day with or without a meal. 120 tablet 6   No current facility-administered medications for this visit.   Facility-Administered Medications Ordered in Other Visits  Medication Dose Route Frequency Provider Last Rate Last Admin   heparin lock flush 100 unit/mL  500 Units Intracatheter Once PRN [name], MD       sodium chloride flush (NS) 0.9 % injection 10 mL  10 mL Intracatheter PRN [name], MD        PHYSICAL EXAMINATION: ECOG PERFORMANCE STATUS: 2 - Symptomatic, <50% confined to bed  Vitals:   10/02/21 1354  BP: (!) 134/59  Pulse: 86  Resp: 18  Temp: (!) 97.5 F (36.4 C)  SpO2: 100%   Filed Weights   10/02/21 1354  Weight: 127 lb 8 oz (57.8 kg)      LABORATORY DATA:  I have reviewed the data as listed CMP Latest Ref Rng & Units 10/02/2021 09/24/2021 09/16/2021  Glucose 70 - 99 mg/dL 94 109(H) 132(H)  BUN 6 - 20 mg/dL _0 Creatinine 0.44 - 1.00 mg/dL 0.58 0.98 0.90  Sodium 135 - 145 mmol/L 141 139 137  Potassium 3.5 - 5.1 mmol/L 3.5 3.5 4.2  Chloride 98 - 111 mmol/L 107 103 107  CO2 22 - 32 mmol/L _1 Calcium 8.9 - 10.3 mg/dL 9.1 9.4 9.0  Total Protein 6.5 - 8.1 g/dL 7.0 8.0 8.4(H)  Total Bilirubin 0.3 - 1.2 mg/dL 0.6 0.7 0.8  Alkaline Phos 38 - 126 U/L 124 136(H) 162(H)  AST 15 - 41 U/L 29 52(H) 45(H)  ALT 0 - 44 U/L 36 82(H) 79(H)    Lab Results  Component Value Date   WBC 6.1 10/02/2021   HGB 9.9 (L) 10/02/2021   HCT 29.9 (L) 10/02/2021   MCV 102.4 (H) 10/02/2021   PLT 127 (L) 10/02/2021   NEUTROABS 4.7 10/02/2021    ASSESSMENT & PLAN:  Malignant neoplasm of lower-outer quadrant of left breast of female, estrogen receptor negative ([place]) Left lumpectomy: IDC grade 3,  1.2 cm, DCIS is present, margins negative, 0/6 lymph nodes negative, ER 0%, PR 0%, HER-2 positive ratio 2.28, Ki-67 20%, T1 BN 0 stage IA S/P Taxol-Herceptin BRCA 2 Mutation Positive   10/20/19: Bone mets involving Right hip, Bil Iliac bones, T8, T9, L2 and L3, Rt prox humerus, Righ glenoid. Some lytic and some sclerotic. Mild Left supraclav adenopathy, 2 small lesions basal ganglia (could be lacunar infarcts)  Biopsy the supra clav LN: 10/31/2019: Metastatic breast cancer, ER/PR negative HER-2 equivocal by IHC, FISH positive Treatment summary: Kadcyla every 3-week treatment.  Discontinued for progression   MRI of the back 01/07/2020: Extensive metastatic disease to the involved spine.  Tumor replacement of L3 vertebral body with pathologic compression fracture, epidural and extraosseous extension.  Tumor extending throughout L3 foramen Status post palliative radiation therapy Causing severe dysphagia and dehydration and hospitalization 01/29/2020-02/03/2020 Talazoparib started 02/07/2020  CT CAP 09/20/19: No evidence of ST mets, tiny inferior right hepatic lesion substantially decreased (3 mm), bone mets have become sclerotic suggest healing CT CAP 12/26/2020: Stable diffuse bone mets CT CAP 07/01/2021: Stable bone metastases.  New liver lesion 1.5 cm Brain MRI 07/12/2021: Abnormal cerebellar signal suspicious for leptomeningeal spread. Lumbar puncture 07/15/2021: Highly suspicious for leptomeningeal carcinomatosis  08/12/2021 through 08/25/2021 whole brain radiation therapy was completed Tucatinib plus Xeloda plus Herceptin based on HER-2 Climb study beginning 12 1, 2022. Liver ablation was completed on August 27, 2021 ------------------------------------------------------------------------------------------------------------------------------------------------------ Current treatment: Tucatinib, capecitabine, trastuzumab (xeloda Tucatinib held for nausea and diarrhea), Xeloda  discontinued  Toxicities: 1. Profound Nausea: With severe acid reflux: She is not able to take any oral medication. She received IV fluids with IV Pepcid 2. plan to hold off on Tucatinib until we see her back in February. We permanently discontinued capecitabine.  We will continue with Herceptin and Delton See Return to clinic on 10/15/2021 for follow-up   No orders of the defined types were placed in this encounter.  The patient has a good understanding of the overall plan. she agrees with it. she will call with any problems that may develop before the next visit here.  Total time spent: 30 mins including face to face time and time spent for planning, charting and coordination of care  Rulon Eisenmenger, MD, MPH 10/02/2021  I, Thana Ates, am acting as scribe for [name].  I have reviewed the above documentation for accuracy and completeness, and I agree with the above.

## 2021-10-02 ENCOUNTER — Inpatient Hospital Stay (HOSPITAL_BASED_OUTPATIENT_CLINIC_OR_DEPARTMENT_OTHER): Payer: Medicaid Other | Admitting: Hematology and Oncology

## 2021-10-02 ENCOUNTER — Other Ambulatory Visit (HOSPITAL_COMMUNITY): Payer: Self-pay

## 2021-10-02 ENCOUNTER — Inpatient Hospital Stay: Payer: Medicaid Other

## 2021-10-02 DIAGNOSIS — Z95828 Presence of other vascular implants and grafts: Secondary | ICD-10-CM

## 2021-10-02 DIAGNOSIS — Z171 Estrogen receptor negative status [ER-]: Secondary | ICD-10-CM

## 2021-10-02 DIAGNOSIS — C7949 Secondary malignant neoplasm of other parts of nervous system: Secondary | ICD-10-CM

## 2021-10-02 DIAGNOSIS — Z7189 Other specified counseling: Secondary | ICD-10-CM

## 2021-10-02 DIAGNOSIS — C50512 Malignant neoplasm of lower-outer quadrant of left female breast: Secondary | ICD-10-CM | POA: Diagnosis not present

## 2021-10-02 DIAGNOSIS — Z5112 Encounter for antineoplastic immunotherapy: Secondary | ICD-10-CM | POA: Diagnosis not present

## 2021-10-02 LAB — CMP (CANCER CENTER ONLY)
ALT: 36 U/L (ref 0–44)
AST: 29 U/L (ref 15–41)
Albumin: 3.8 g/dL (ref 3.5–5.0)
Alkaline Phosphatase: 124 U/L (ref 38–126)
Anion gap: 7 (ref 5–15)
BUN: 6 mg/dL (ref 6–20)
CO2: 27 mmol/L (ref 22–32)
Calcium: 9.1 mg/dL (ref 8.9–10.3)
Chloride: 107 mmol/L (ref 98–111)
Creatinine: 0.58 mg/dL (ref 0.44–1.00)
GFR, Estimated: >60 mL/min (ref >60)
Glucose, Bld: 94 mg/dL (ref 70–99)
Potassium: 3.5 mmol/L (ref 3.5–5.1)
Sodium: 141 mmol/L (ref 135–145)
Total Bilirubin: 0.6 mg/dL (ref 0.3–1.2)
Total Protein: 7 g/dL (ref 6.5–8.1)

## 2021-10-02 LAB — CBC WITH DIFFERENTIAL (CANCER CENTER ONLY)
Abs Immature Granulocytes: 0.02 10*3/uL (ref 0.00–0.07)
Basophils Absolute: 0 10*3/uL (ref 0.0–0.1)
Basophils Relative: 0 %
Eosinophils Absolute: 0 10*3/uL (ref 0.0–0.5)
Eosinophils Relative: 1 %
HCT: 29.9 % — ABNORMAL LOW (ref 36.0–46.0)
Hemoglobin: 9.9 g/dL — ABNORMAL LOW (ref 12.0–15.0)
Immature Granulocytes: 0 %
Lymphocytes Relative: 16 %
Lymphs Abs: 1 10*3/uL (ref 0.7–4.0)
MCH: 33.9 pg (ref 26.0–34.0)
MCHC: 33.1 g/dL (ref 30.0–36.0)
MCV: 102.4 fL — ABNORMAL HIGH (ref 80.0–100.0)
Monocytes Absolute: 0.4 10*3/uL (ref 0.1–1.0)
Monocytes Relative: 7 %
Neutro Abs: 4.7 10*3/uL (ref 1.7–7.7)
Neutrophils Relative %: 76 %
Platelet Count: 127 10*3/uL — ABNORMAL LOW (ref 150–400)
RBC: 2.92 MIL/uL — ABNORMAL LOW (ref 3.87–5.11)
RDW: 16.9 % — ABNORMAL HIGH (ref 11.5–15.5)
WBC Count: 6.1 10*3/uL (ref 4.0–10.5)
nRBC: 0 % (ref 0.0–0.2)

## 2021-10-02 MED ORDER — SODIUM CHLORIDE 0.9% FLUSH
10.0000 mL | INTRAVENOUS | Status: DC | PRN
  Administered 2021-10-02: 10 mL

## 2021-10-02 NOTE — Assessment & Plan Note (Signed)
Left lumpectomy: IDC grade 3, 1.2 cm, DCIS is present, margins negative, 0/6 lymph nodes negative, ER 0%, PR 0%, HER-2 positive ratio 2.28, Ki-67 20%, T1 BN 0 stage IA S/P Taxol-Herceptin BRCA 2 Mutation Positive  10/20/19:Bone mets involving Right hip, Bil Iliac bones, T8, T9, L2 and L3, Rt prox humerus, Righ glenoid. Some lytic and some sclerotic. Mild Left supraclav adenopathy, 2 small lesions basal ganglia (could be lacunar infarcts)  Biopsy the supra clav LN: 10/31/2019: Metastatic breast cancer, ER/PR negative HER-2 equivocal by IHC, FISHpositive Treatmentsummary: Kadcyla every 3-week treatment.Discontinued for progression  MRI of the back 01/07/2020: Extensive metastatic disease to the involved spine. Tumor replacement of L3 vertebral body with pathologic compression fracture, epidural and extraosseous extension. Tumor extending throughout L3 foramen Status post palliative radiation therapyCausing severe dysphagia and dehydration and hospitalization5/17/2021-02/03/2020 Talazoparib started 02/07/2020 CT CAP 09/20/19: No evidence of ST mets, tiny inferior right hepatic lesion substantially decreased(3 mm), bone mets have become sclerotic suggest healing CT CAP 12/26/2020:Stable diffuse bone mets CT CAP 07/01/2021: Stable bone metastases. New liver lesion 1.5 cm Brain MRI 07/12/2021: Abnormal cerebellar signal suspicious for leptomeningeal spread. Lumbar puncture 07/15/2021: Highly suspicious for leptomeningeal carcinomatosis 08/12/2021 through 08/25/2021 whole brain radiation therapy was completed Tucatinib plus Xeloda plus Herceptin based on HER-2 Climb study beginning 12 1, 2022. Liver ablation was completed on August 27, 2021 ------------------------------------------------------------------------------------------------------------------------------------------------------ Current treatment:Tucatinib, capecitabine, trastuzumab (xeloda Tucatinib held for nausea and diarrhea),  Xeloda discontinued  Toxicities: 1. Profound Nausea: With severe acid reflux: She is not able to take any oral medication.  I sent prescription for Phenergan to be used as a suppository.    She received IV fluids with IV Pepcid   I would like to reassess her on 10/02/2020.  If the symptoms improve then we can consider restarting Tucatinib. If the symptoms do not improve then we may have to attribute some of the symptoms to the leptomeningeal disease.

## 2021-10-06 ENCOUNTER — Other Ambulatory Visit (HOSPITAL_COMMUNITY): Payer: Self-pay

## 2021-10-06 ENCOUNTER — Other Ambulatory Visit: Payer: Self-pay

## 2021-10-06 ENCOUNTER — Ambulatory Visit: Payer: Medicaid Other | Attending: Nurse Practitioner | Admitting: Nurse Practitioner

## 2021-10-06 ENCOUNTER — Encounter: Payer: Self-pay | Admitting: Nurse Practitioner

## 2021-10-06 VITALS — BP 101/68 | HR 83 | Ht 61.0 in | Wt 125.0 lb

## 2021-10-06 DIAGNOSIS — Z23 Encounter for immunization: Secondary | ICD-10-CM

## 2021-10-06 DIAGNOSIS — K219 Gastro-esophageal reflux disease without esophagitis: Secondary | ICD-10-CM

## 2021-10-06 DIAGNOSIS — E119 Type 2 diabetes mellitus without complications: Secondary | ICD-10-CM

## 2021-10-06 MED ORDER — FAMOTIDINE 40 MG PO TABS: 40.0000 mg | ORAL_TABLET | Freq: Every day | ORAL | 1 refills | Status: AC

## 2021-10-06 NOTE — Progress Notes (Signed)
Assessment & Plan:  Erin [name] was seen today for diabetes.  Diagnoses and all orders for this visit:  Type 2 diabetes mellitus without complication, without long-term current use of insulin (HCC) Continue blood sugar control as discussed in office today, low carbohydrate diet, and regular physical exercise as tolerated, 150 minutes per week (30 min each day, 5 days per week, or 50 min 3 days per week). Keep blood sugar logs with fasting goal of 90-130 mg/dl, post prandial (after you eat) less than 180.  For Hypoglycemia: BS <60 and Hyperglycemia BS >400; contact the clinic ASAP. Annual eye exams and foot exams are recommended.   GERD without esophagitis -     famotidine (PEPCID) 40 MG tablet; Take 1 tablet (40 mg total) by mouth daily.  Need for Tdap vaccination -     Tdap vaccine greater than or equal to 7yo IM    Patient has been counseled on age-appropriate routine health concerns for screening and prevention. These are reviewed and up-to-date. Referrals have been placed accordingly. Immunizations are up-to-date or declined.    Subjective:   Chief Complaint  Patient presents with   Diabetes   HPI Erin [name] 61 y.o. female presents to office today for follow up to DM and HTN.   She is followed by Oncology Dr. 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 Current treatment herceptin and xgeva.   Blood pressure is well controlled.  BP Readings from Last 3 Encounters:  10/06/21 101/68  10/02/21 (!) 134/59  10/01/21 118/64    DM 2 She stopped taking metformin 3 months ago. States it was making her sick. She is only taking cinnamon at this time. LDL at goal with atorvastatin 40 mg daily.  Lab Results  Component Value Date   HGBA1C 6.5 (H) 08/22/2021   Lab Results  Component Value Date   LDLCALC 50 04/04/2021    GERD Patient complains of heartburn. This has been associated with bilious reflux, deep pressure at base of neck, heartburn,  midespigastric pain, nausea, and unexpected weight loss.  She denies choking on food. Symptoms have been present for several months. She denies dysphagia.  She has had a weight loss of 20 pounds over a period of 2 months. She denies melena, hematochezia, hematemesis, and coffee ground emesis. Medical therapy in the past has included  IV PEPCID .  She states she would like to try an oral medication for her symptoms at this time despite a history in her chart of not being able to take oral acid reducing medications.   Wt Readings from Last 3 Encounters:  10/06/21 125 lb (56.7 kg)  10/02/21 127 lb 8 oz (57.8 kg)  09/29/21 127 lb 8 oz (57.8 kg)    Review of Systems  Constitutional:  Positive for weight loss. Negative for fever and malaise/fatigue.  HENT: Negative.  Negative for nosebleeds.   Eyes: Negative.  Negative for blurred vision, double vision and photophobia.  Respiratory: Negative.  Negative for cough and shortness of breath.   Cardiovascular: Negative.  Negative for chest pain, palpitations and leg swelling.  Gastrointestinal:  Positive for heartburn. Negative for nausea and vomiting.  Musculoskeletal: Negative.  Negative for myalgias.  Neurological: Negative.  Negative for dizziness, focal weakness, seizures and headaches.  Psychiatric/Behavioral: Negative.  Negative for suicidal ideas.    Past Medical History:  Diagnosis Date   Arthritis    knees, elbows   Borderline glaucoma of right eye    BRCA2 gene mutation  positive 02/17/2018   BRCA2 c.4552del (p.[id number]*25)  Result reported out on 02/15/2018.    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 1/2 lost hearing , was told by mother unknown cause, can miminally hear in left and no hearing on right    Depression    Elevated cancer antigen 125 (CA 125)    Family history of breast cancer    Family history of breast cancer    Family history of colon cancer    Genetic  testing 02/17/2018   The Common Hereditary Cancer Panel offered by [facility] includes sequencing and/or deletion duplication testing of the following 47 genes: APC, ATM, AXIN2, BARD1, BMPR1A, BRCA1, BRCA2, BRIP1, CDH1, CDKN2A (p14ARF), CDKN2A (p16INK4a), CKD4, CHEK2, CTNNA1, DICER1, EPCAM (Deletion/duplication testing only), GREM1 (promoter region deletion/duplication testing only), KIT, MEN1, MLH1, MSH2, MSH3, MSH6, MU   GERD (gastroesophageal reflux disease)    History of cancer chemotherapy    left breast cancer 09-08-2017 to 10-09-2017;  fallopion tube cancer  12-15-2017  to 06-28-2018   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 oncologist-  dr [name]   11-04-2017  s/p  LAVH w/ BSO,  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;  completed chemo 06-28-2018   History of external beam radiation therapy    left breast  11-25-2017  to 01-07-2018   Hyperlipidemia    Hypertension    followed by pcp   (10-05-2019  per pt never had stress test)   IDA (iron deficiency anemia)    Malignant neoplasm of lower-outer quadrant of left breast of female, estrogen receptor negative [facility]) oncologist-- dr [name]   dx 08/ 2018--- Stage IA, DCIS,  ER/ PR negative,  HER-2 positive;  06-09-2017 s/p left breast lumpectomy with node dissection;  completed chemo 10-09-2017  and radiation 01-07-2018/  hercepton completed 06-28-2018   Non-insulin dependent type 2 diabetes mellitus ([place])    followed by pcp   (10-05-2019 per pt check cbg every other day in AM,  fasting cbg-- 105)   Numbness of right thumb    Wears glasses     Past Surgical History:  Procedure Laterality Date   BREAST LUMPECTOMY WITH RADIOACTIVE SEED AND SENTINEL LYMPH NODE BIOPSY Left 06/09/2017   Procedure: BREAST LUMPECTOMY WITH RADIOACTIVE SEED AND SENTINEL LYMPH NODE BIOPSY;  Surgeon: Excell Seltzer, MD;  Location: [facility];  Service: General;  Laterality: Left;   COLONOSCOPY  11/02/2017    polyps   HERNIA REPAIR     HIATAL HERNIA REPAIR  09/2019   IR RADIOLOGIST EVAL & MGMT  07/09/2021   IR RADIOLOGIST EVAL & MGMT  07/24/2021   IR RADIOLOGIST EVAL & MGMT  09/30/2021   LAPAROSCOPIC ASSISTED VAGINAL HYSTERECTOMY N/A 11/04/2017   Procedure: LAPAROSCOPIC ASSISTED VAGINAL HYSTERECTOMY;  Surgeon: [name], MD;  Location: [facility];  Service: Gynecology;  Laterality: N/A;   LAPAROSCOPIC BILATERAL SALPINGO OOPHERECTOMY Bilateral 11/04/2017   Procedure: LAPAROSCOPIC BILATERAL SALPINGO OOPHORECTOMY;  Surgeon: [name], MD;  Location: [facility];  Service: Gynecology;  Laterality: Bilateral;   LAPAROSCOPY N/A 10/09/2019   Procedure: LAPAROSCOPY DIAGNOSTIC WITH PERITONEAL BIOPSIES;  Surgeon: [name], MD;  Location: [facility];  Service: Gynecology;  Laterality: N/A;   PORTACATH PLACEMENT Right 06/09/2017   Procedure: INSERTION PORT-A-CATH WITH [place];  Surgeon: Excell Seltzer, MD;  Location: [facility];  Service: General;  Laterality: Right;   PORTACATH PLACEMENT Right 11/13/2019   Procedure: INSERTION PORT-A-CATH WITH ULTRASOUND GUIDANCE;  Surgeon: Rolm Bookbinder, MD;  Location: [facility];  Service: General;  Laterality: Right;   RADIOLOGY WITH ANESTHESIA N/A 08/27/2021   Procedure: IR WITH ANESTHESIA MICROWAVE ABLATION OF LIVER;  Surgeon: Aletta Edouard, MD;  Location: [facility];  Service: Radiology;  Laterality: N/A;   TUBAL LIGATION  02/02/2002   _0    PPTL   UPPER GI ENDOSCOPY      Family History  Problem Relation Age of Onset   Hypertension Mother    Lupus Mother    Diabetes Father    Hypertension Sister    Diabetes Paternal Grandmother    Colon cancer Paternal Grandmother 41   CAD Brother    Diabetes Brother    Heart attack Brother    Breast cancer Maternal Aunt        dx >50   Heart attack Paternal Grandfather    Breast cancer Maternal Aunt        dx under 60   Breast cancer Maternal Aunt        dx  under 81     Social History Reviewed with no changes to be made today.   Outpatient Medications Prior to Visit  Medication Sig Dispense Refill   Accu-Chek FastClix Lancets MISC Use as instructed. Inject into the skin twice daily 100 each 3   atorvastatin (LIPITOR) 40 MG tablet TAKE 1 TABLET BY MOUTH EVERY DAY 30 tablet 0   Blood Glucose Calibration (ACCU-CHEK GUIDE CONTROL) LIQD 1 each by In Vitro route once as needed for up to 1 dose. 1 each 0   CINNAMON PO Take 2,400 mg by mouth every evening. 1200 mg per cap     diclofenac Sodium (VOLTAREN) 1 % GEL Apply 1 application topically daily as needed (pain).     glucose blood (ACCU-CHEK GUIDE) test strip Use as instructed. Check blood glucose by fingerstick twice per day. 100 each 12   lidocaine-prilocaine (EMLA) cream Apply to affected area once (Patient taking differently: Apply 1 application topically daily as needed. Apply to affected area once) 30 g 3   loperamide (IMODIUM) 2 MG capsule Take 2 mg by mouth as needed for diarrhea or loose stools.     memantine (NAMENDA) 10 MG tablet Take 1 tablet (10 mg total) by mouth 2 (two) times daily. 60 tablet 4   Multiple Vitamins-Minerals (MULTIVITAMIN ADULT) CHEW Chew 2 each by mouth daily.     ondansetron (ZOFRAN ODT) 4 MG disintegrating tablet Take 1 tablet (4 mg total) by mouth every 8 (eight) hours as needed for nausea or vomiting. 30 tablet 0   ondansetron (ZOFRAN) 8 MG tablet Take 1 tablet (8 mg total) by mouth 2 (two) times daily as needed (Nausea or vomiting). 30 tablet 1   prochlorperazine (COMPAZINE) 10 MG tablet Take 1 tablet (10 mg total) by mouth every 6 (six) hours as needed (Nausea or vomiting). 30 tablet 1   promethazine (PHENERGAN) 25 MG suppository Place 1 suppository (25 mg total) rectally every 6 (six) hours as needed for nausea or vomiting. 30 each 0   tucatinib (TUKYSA) 150 MG tablet Take 2 tablets (300 mg total) by mouth 2 (two) times daily. Take every 12 hrs at the same time each day with  or without a meal. 120 tablet 6   Facility-Administered Medications Prior to Visit  Medication Dose Route Frequency Provider Last Rate Last Admin  heparin lock flush 100 unit/mL  500 Units Intracatheter Once PRN [name], MD       sodium chloride flush (NS) 0.9 % injection 10 mL  10 mL Intracatheter PRN [name], MD        No Known Allergies     Objective:    BP 101/68    Pulse 83    Ht _0  (1.549 m)    Wt 125 lb (56.7 kg)    LMP 07/09/2017 (LMP Unknown)    SpO2 100%    BMI 23.62 kg/m  Wt Readings from Last 3 Encounters:  10/06/21 125 lb (56.7 kg)  10/02/21 127 lb 8 oz (57.8 kg)  09/29/21 127 lb 8 oz (57.8 kg)    Physical Exam Vitals and nursing note reviewed.  Constitutional:      Appearance: She is well-developed.  HENT:     Head: Normocephalic and atraumatic.  Cardiovascular:     Rate and Rhythm: Normal rate and regular rhythm.     Heart sounds: Normal heart sounds. No murmur heard.   No friction rub. No gallop.  Pulmonary:     Effort: Pulmonary effort is normal. No tachypnea or respiratory distress.     Breath sounds: Normal breath sounds. No decreased breath sounds, wheezing, rhonchi or rales.  Chest:     Chest wall: No tenderness.  Abdominal:     General: Bowel sounds are normal.     Palpations: Abdomen is soft.  Musculoskeletal:        General: Normal range of motion.     Cervical back: Normal range of motion.  Skin:    General: Skin is warm and dry.  Neurological:     Mental Status: She is alert and oriented to person, place, and time.     Coordination: Coordination normal.  Psychiatric:        Behavior: Behavior normal. Behavior is cooperative.        Thought Content: Thought content normal.        Judgment: Judgment normal.         Patient has been counseled extensively about nutrition and exercise as well as the importance of adherence with medications and regular follow-up. The patient was given clear instructions to go to ER or return  to medical center if symptoms don't improve, worsen or new problems develop. The patient verbalized understanding.   Follow-up: Return in about 3 months (around 01/04/2022) for DM/HTN.   [name], FNP-BC [facility] and [facility] [place], [place]   10/06/2021, 9:10 AM

## 2021-10-14 ENCOUNTER — Inpatient Hospital Stay: Payer: Medicaid Other | Admitting: Pharmacist

## 2021-10-14 NOTE — Progress Notes (Signed)
Patient Care Team: [name], NP as PCP - General (Nurse Practitioner) Excell Seltzer, MD (Inactive) as Consulting Physician (General Surgery) [name], MD as Consulting Physician (Hematology and Oncology) Kyung Rudd, MD as Consulting Physician (Radiation Oncology) [name], MD as Consulting Physician (Gynecologic Oncology) Delice Bison, Charlestine Massed, NP as Nurse Practitioner (Hematology and Oncology) Mauro Kaufmann, RN as Oncology Nurse Navigator Rockwell Germany, RN as Oncology Nurse Navigator  DIAGNOSIS:    ICD-10-CM   1. Malignant neoplasm of lower-outer quadrant of left breast of female, estrogen receptor negative ([place])  C50.512    Z17.1       SUMMARY OF ONCOLOGIC HISTORY: Oncology History  Malignant neoplasm of lower-outer quadrant of left breast of female, estrogen receptor negative ([place])  05/11/2017 Initial Diagnosis   Left breast biopsy 3:30 position 6 cm from nipple: IDC with DCIS, lymphovascular invasion present, grade 2-3, ER 0%, PR 0%, HER-2 positive ratio 2.28, Ki-67 20%, 1 cm lesion left breast, T1b N0 stage IA clinical stage   06/09/2017 Surgery   Left lumpectomy: IDC grade 3, 1.2 cm, DCIS is present, margins negative, 0/6 lymph nodes negative, ER 0%, PR 0%, HER-2 positive ratio 2.28, Ki-67 20%, T1 BN 0 stage IA   07/09/2017 - 09/29/2017 Adjuvant Chemotherapy   Taxol/Herceptin x 12 completed on 09/29/2017, went on to receive every 3 week adjuvant Herceptin through 06/28/2018   11/25/2017 - 01/07/2018 Radiation Therapy    The patient initially received a dose of 50.4 Gy in 28 fractions to the breast using whole-breast tangent fields. This was delivered using a 3-D conformal technique. The patient then received a boost to the seroma. This delivered an additional 10 Gy in 5 fractions using a 3-D technique. The total dose was 60.4 Gy.   02/15/2018 Genetic Testing   The Common Hereditary Cancer Panel offered by [facility] includes sequencing and/or deletion  duplication testing of the following 47 genes: APC, ATM, AXIN2, BARD1, BMPR1A, BRCA1, BRCA2, BRIP1, CDH1, CDKN2A (p14ARF), CDKN2A (p16INK4a), CKD4, CHEK2, CTNNA1, DICER1, EPCAM (Deletion/duplication testing only), GREM1 (promoter region deletion/duplication testing only), KIT, MEN1, MLH1, MSH2, MSH3, MSH6, MUTYH, NBN, NF1, NHTL1, PALB2, PDGFRA, PMS2, POLD1, POLE, PTEN, RAD50, RAD51C, RAD51D, SDHB, SDHC, SDHD, SMAD4, SMARCA4. STK11, TP53, TSC1, TSC2, and VHL.  The following genes were evaluated for sequence changes only: SDHA and HOXB13 c.251G>A variant only.   Results: POSITIVE. 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 The date of this test report is 02/15/2018   10/20/2019 PET scan   Bone mets involving Right hip, Bil Iliac bones, T8, T9, L2 and L3, Rt prox humerus, Righ glenoid. Some lytic and some sclerotic. Mild Left supraclav adenopathy, 2 small lesions basal ganglia (could be lacunar infarcts)   11/14/2019 - 12/26/2019 Chemotherapy   The patient had palonosetron (ALOXI) injection 0.25 mg, 0.25 mg, Intravenous,  Once, 2 of 5 cycles Administration: 0.25 mg (12/05/2019), 0.25 mg (12/26/2019) ado-trastuzumab emtansine (KADCYLA) 260 mg in sodium chloride 0.9 % 250 mL chemo infusion, 3.6 mg/kg = 260 mg, Intravenous, Once, 3 of 6 cycles Administration: 260 mg (11/14/2019), 260 mg (12/05/2019), 260 mg (12/26/2019)   for chemotherapy treatment.     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  Required TPN briefly.  Patient was able to swallow and keep food down by the time of discharge.   02/06/2020 Miscellaneous   Talazoparib   07/15/2021 - 07/15/2021 Chemotherapy  Patient is on Treatment Plan : Intrathecal Methotrexate     08/14/2021 -  Chemotherapy   Patient is on Treatment Plan : BREAST Capecitabine + Trastuzumab + Tucatinib q21d      Fallopian tube cancer, carcinoma, right ([place])  11/04/2017 Surgery   Uterus, cervix and bilateral fallopian tubes: Fallopian tube with serous carcinoma 0.6 cm positive for p53, PAX8, WT-1, MOC-31, cytokeratin 7, and estrogen receptor   01/11/2018 -  Chemotherapy   Taxol and carboplatin every 3 weeks (Taxol discontinued with cycle 2 because of profound rash)   10/20/2019 PET scan   Bone mets involving Right hip, Bil Iliac bones, T8, T9, L2 and L3, Rt prox humerus, Righ glenoid. Some lytic and some sclerotic. Mild Left supraclav adenopathy, 2 small lesions basal ganglia (could be lacunar infarcts)   11/14/2019 - 12/26/2019 Chemotherapy   The patient had palonosetron (ALOXI) injection 0.25 mg, 0.25 mg, Intravenous,  Once, 2 of 5 cycles Administration: 0.25 mg (12/05/2019), 0.25 mg (12/26/2019) ado-trastuzumab emtansine (KADCYLA) 260 mg in sodium chloride 0.9 % 250 mL chemo infusion, 3.6 mg/kg = 260 mg, Intravenous, Once, 3 of 6 cycles Administration: 260 mg (11/14/2019), 260 mg (12/05/2019), 260 mg (12/26/2019)   for chemotherapy treatment.     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 with above-mentioned history of metastatic breast cancer who had leptomeningeal disease and was started on Xeloda with to [facility] and Herceptin. She presents to the clinic today for follow-up.  She is not eating as much as her mother would like her to eat.  She is increasing her daily intake of food.  Her mother is also complaining that she is not walking  or doing any activity.  ALLERGIES:  has No Known Allergies.  MEDICATIONS:  Current Outpatient Medications  Medication Sig Dispense Refill   Accu-Chek FastClix Lancets MISC Use as instructed. Inject into the skin twice daily 100 each 3   atorvastatin (LIPITOR) 40 MG tablet TAKE 1 TABLET BY MOUTH EVERY DAY 30 tablet 0   Blood Glucose Calibration (ACCU-CHEK GUIDE CONTROL) LIQD 1 each by In Vitro route once as needed for up to 1 dose. 1 each 0   CINNAMON PO Take 2,400 mg by mouth every evening. 1200 mg per cap     diclofenac Sodium (VOLTAREN) 1 % GEL Apply 1 application topically daily as needed (pain).     famotidine (PEPCID) 40 MG tablet Take 1 tablet (40 mg total) by mouth daily. 90 tablet 1   glucose blood (ACCU-CHEK GUIDE) test strip Use as instructed. Check blood glucose by fingerstick twice per day. 100 each 12   lidocaine-prilocaine (EMLA) cream Apply to affected area once (Patient taking differently: Apply 1 application topically daily as needed. Apply to affected area once) 30 g 3   loperamide (IMODIUM) 2 MG capsule Take 2 mg by mouth as needed for diarrhea or loose stools.     memantine (NAMENDA) 10 MG tablet Take 1 tablet (10 mg total) by mouth 2 (two) times daily. 60 tablet 4  Multiple Vitamins-Minerals (MULTIVITAMIN ADULT) CHEW Chew 2 each by mouth daily.     ondansetron (ZOFRAN ODT) 4 MG disintegrating tablet Take 1 tablet (4 mg total) by mouth every 8 (eight) hours as needed for nausea or vomiting. 30 tablet 0   ondansetron (ZOFRAN) 8 MG tablet Take 1 tablet (8 mg total) by mouth 2 (two) times daily as needed (Nausea or vomiting). 30 tablet 1   prochlorperazine (COMPAZINE) 10 MG tablet Take 1 tablet (10 mg total) by mouth every 6 (six) hours as needed (Nausea or vomiting). 30 tablet 1   promethazine (PHENERGAN) 25 MG suppository Place 1 suppository (25 mg total) rectally every 6 (six) hours as needed for nausea or vomiting. 30 each 0   tucatinib (TUKYSA) 150 MG tablet Take 2  tablets (300 mg total) by mouth 2 (two) times daily. Take every 12 hrs at the same time each day with or without a meal. 120 tablet 6   No current facility-administered medications for this visit.   Facility-Administered Medications Ordered in Other Visits  Medication Dose Route Frequency Provider Last Rate Last Admin   heparin lock flush 100 unit/mL  500 Units Intracatheter Once PRN [name], MD       sodium chloride flush (NS) 0.9 % injection 10 mL  10 mL Intracatheter PRN [name], MD        PHYSICAL EXAMINATION: ECOG PERFORMANCE STATUS: 1 - Symptomatic but completely ambulatory  Vitals:   10/15/21 0849  BP: 127/75  Pulse: (!) 107  Resp: 18  Temp: 98.8 F (37.1 C)  SpO2: 100%   Filed Weights   10/15/21 0849  Weight: 125 lb 6.4 oz (56.9 kg)      LABORATORY DATA:  I have reviewed the data as listed CMP Latest Ref Rng & Units 10/02/2021 09/24/2021 09/16/2021  Glucose 70 - 99 mg/dL 94 109(H) 132(H)  BUN 6 - 20 mg/dL _0 Creatinine 0.44 - 1.00 mg/dL 0.58 0.98 0.90  Sodium 135 - 145 mmol/L 141 139 137  Potassium 3.5 - 5.1 mmol/L 3.5 3.5 4.2  Chloride 98 - 111 mmol/L 107 103 107  CO2 22 - 32 mmol/L _1 Calcium 8.9 - 10.3 mg/dL 9.1 9.4 9.0  Total Protein 6.5 - 8.1 g/dL 7.0 8.0 8.4(H)  Total Bilirubin 0.3 - 1.2 mg/dL 0.6 0.7 0.8  Alkaline Phos 38 - 126 U/L 124 136(H) 162(H)  AST 15 - 41 U/L 29 52(H) 45(H)  ALT 0 - 44 U/L 36 82(H) 79(H)    Lab Results  Component Value Date   WBC 7.2 10/15/2021   HGB 10.5 (L) 10/15/2021   HCT 32.1 (L) 10/15/2021   MCV 101.6 (H) 10/15/2021   PLT 131 (L) 10/15/2021   NEUTROABS 5.6 10/15/2021    ASSESSMENT & PLAN:  Malignant neoplasm of lower-outer quadrant of left breast of female, estrogen receptor negative ([facility]) Left lumpectomy: IDC grade 3, 1.2 cm, DCIS is present, margins negative, 0/6 lymph nodes negative, ER 0%, PR 0%, HER-2 positive ratio 2.28, Ki-67 20%, T1 BN 0 stage IA S/P Taxol-Herceptin BRCA 2 Mutation  Positive   10/20/19: Bone mets involving Right hip, Bil Iliac bones, T8, T9, L2 and L3, Rt prox humerus, Righ glenoid. Some lytic and some sclerotic. Mild Left supraclav adenopathy, 2 small lesions basal ganglia (could be lacunar infarcts)   Biopsy the supra clav LN: 10/31/2019: Metastatic breast cancer, ER/PR negative HER-2 equivocal by IHC, FISH positive Treatment summary: Kadcyla every 3-week treatment.  Discontinued for  progression   MRI of the back 01/07/2020: Extensive metastatic disease to the involved spine.  Tumor replacement of L3 vertebral body with pathologic compression fracture, epidural and extraosseous extension.  Tumor extending throughout L3 foramen Status post palliative radiation therapy Causing severe dysphagia and dehydration and hospitalization 01/29/2020-02/03/2020 Talazoparib started 02/07/2020  CT CAP 09/20/19: No evidence of ST mets, tiny inferior right hepatic lesion substantially decreased (3 mm), bone mets have become sclerotic suggest healing CT CAP 12/26/2020: Stable diffuse bone mets CT CAP 07/01/2021: Stable bone metastases.  New liver lesion 1.5 cm Brain MRI 07/12/2021: Abnormal cerebellar signal suspicious for leptomeningeal spread. Lumbar puncture 07/15/2021: Highly suspicious for leptomeningeal carcinomatosis  08/12/2021 through 08/25/2021 whole brain radiation therapy was completed Tucatinib plus Xeloda plus Herceptin based on HER-2 Climb study beginning 12 1, 2022. Liver ablation was completed on August 27, 2021 ------------------------------------------------------------------------------------------------------------------------------------------------------ Current treatment: Tucatinib, capecitabine, trastuzumab (xeloda Tucatinib held for nausea and diarrhea), Xeloda discontinued   Toxicities: 1. Profound Nausea: With severe acid reflux: She is not able to take any oral medication. She received IV fluids with IV Pepcid 2. resuming Tucatinib at 150 mg p.o.  twice daily.  If she tolerates this well next follow-up in 3 weeks we can discuss about increasing to 2 tablets in the morning 1 tablet in the evening and subsequently to 2 tablets in the morning and 2 tablets in the evening.  We permanently discontinued capecitabine. I encouraged her to walk at least 5000 steps a day and eat more protein in diet.  We will continue with Herceptin and Delton See Return to clinic in 3 weeks for follow-up    No orders of the defined types were placed in this encounter.  The patient has a good understanding of the overall plan. she agrees with it. she will call with any problems that may develop before the next visit here.  Total time spent: 30 mins including face to face time and time spent for planning, charting and coordination of care  Rulon Eisenmenger, MD, MPH 10/15/2021  I, Thana Ates, am acting as scribe for [name].  I have reviewed the above documentation for accuracy and completeness, and I agree with the above.

## 2021-10-14 NOTE — Assessment & Plan Note (Signed)
Left lumpectomy: IDC grade 3, 1.2 cm, DCIS is present, margins negative, 0/6 lymph nodes negative, ER 0%, PR 0%, HER-2 positive ratio 2.28, Ki-67 20%, T1 BN 0 stage IA S/P Taxol-Herceptin BRCA 2 Mutation Positive  10/20/19:Bone mets involving Right hip, Bil Iliac bones, T8, T9, L2 and L3, Rt prox humerus, Righ glenoid. Some lytic and some sclerotic. Mild Left supraclav adenopathy, 2 small lesions basal ganglia (could be lacunar infarcts)  Biopsy the supra clav LN: 10/31/2019: Metastatic breast cancer, ER/PR negative HER-2 equivocal by IHC, FISHpositive Treatmentsummary: Kadcyla every 3-week treatment.Discontinued for progression  MRI of the back 01/07/2020: Extensive metastatic disease to the involved spine. Tumor replacement of L3 vertebral body with pathologic compression fracture, epidural and extraosseous extension. Tumor extending throughout L3 foramen Status post palliative radiation therapyCausing severe dysphagia and dehydration and hospitalization5/17/2021-02/03/2020 Talazoparib started 02/07/2020 CT CAP 09/20/19: No evidence of ST mets, tiny inferior right hepatic lesion substantially decreased(3 mm), bone mets have become sclerotic suggest healing CT CAP 12/26/2020:Stable diffuse bone mets CT CAP 07/01/2021: Stable bone metastases. New liver lesion 1.5 cm Brain MRI 07/12/2021: Abnormal cerebellar signal suspicious for leptomeningeal spread. Lumbar puncture 07/15/2021: Highly suspicious for leptomeningeal carcinomatosis 08/12/2021 through 08/25/2021 whole brain radiation therapy was completed Tucatinib plus Xeloda plus Herceptin based on HER-2 Climb study beginning 12 1, 2022. Liver ablation was completed on August 27, 2021 ------------------------------------------------------------------------------------------------------------------------------------------------------ Current treatment:Tucatinib, capecitabine, trastuzumab(xeloda Tucatinib held for nausea and diarrhea),  Xeloda discontinued  Toxicities: 1. Profound Nausea: With severe acid reflux: She is not able to take any oral medication. She received IV fluids with IV Pepcid 2. Continue to hold off on Tucatinib until we see her back in February. We permanently discontinued capecitabine.  We will continue with Herceptin and Delton See Return to clinic on 10/15/2021 for follow-up

## 2021-10-15 ENCOUNTER — Inpatient Hospital Stay: Payer: Medicaid Other

## 2021-10-15 ENCOUNTER — Inpatient Hospital Stay: Payer: Medicaid Other | Attending: Gynecologic Oncology

## 2021-10-15 ENCOUNTER — Other Ambulatory Visit: Payer: Self-pay | Admitting: Hematology and Oncology

## 2021-10-15 ENCOUNTER — Inpatient Hospital Stay (HOSPITAL_BASED_OUTPATIENT_CLINIC_OR_DEPARTMENT_OTHER): Payer: Medicaid Other | Admitting: Hematology and Oncology

## 2021-10-15 ENCOUNTER — Other Ambulatory Visit: Payer: Self-pay

## 2021-10-15 VITALS — HR 96

## 2021-10-15 DIAGNOSIS — Z923 Personal history of irradiation: Secondary | ICD-10-CM | POA: Diagnosis not present

## 2021-10-15 DIAGNOSIS — Z7984 Long term (current) use of oral hypoglycemic drugs: Secondary | ICD-10-CM | POA: Insufficient documentation

## 2021-10-15 DIAGNOSIS — C50512 Malignant neoplasm of lower-outer quadrant of left female breast: Secondary | ICD-10-CM | POA: Insufficient documentation

## 2021-10-15 DIAGNOSIS — K2100 Gastro-esophageal reflux disease with esophagitis, without bleeding: Secondary | ICD-10-CM | POA: Insufficient documentation

## 2021-10-15 DIAGNOSIS — Z5112 Encounter for antineoplastic immunotherapy: Secondary | ICD-10-CM | POA: Insufficient documentation

## 2021-10-15 DIAGNOSIS — D509 Iron deficiency anemia, unspecified: Secondary | ICD-10-CM | POA: Diagnosis not present

## 2021-10-15 DIAGNOSIS — Z171 Estrogen receptor negative status [ER-]: Secondary | ICD-10-CM

## 2021-10-15 DIAGNOSIS — C7951 Secondary malignant neoplasm of bone: Secondary | ICD-10-CM | POA: Diagnosis not present

## 2021-10-15 DIAGNOSIS — R112 Nausea with vomiting, unspecified: Secondary | ICD-10-CM | POA: Diagnosis not present

## 2021-10-15 DIAGNOSIS — C7949 Secondary malignant neoplasm of other parts of nervous system: Secondary | ICD-10-CM

## 2021-10-15 DIAGNOSIS — C7932 Secondary malignant neoplasm of cerebral meninges: Secondary | ICD-10-CM | POA: Insufficient documentation

## 2021-10-15 DIAGNOSIS — C5700 Malignant neoplasm of unspecified fallopian tube: Secondary | ICD-10-CM | POA: Insufficient documentation

## 2021-10-15 DIAGNOSIS — E119 Type 2 diabetes mellitus without complications: Secondary | ICD-10-CM | POA: Diagnosis not present

## 2021-10-15 DIAGNOSIS — Z95828 Presence of other vascular implants and grafts: Secondary | ICD-10-CM

## 2021-10-15 DIAGNOSIS — Z7189 Other specified counseling: Secondary | ICD-10-CM

## 2021-10-15 DIAGNOSIS — I10 Essential (primary) hypertension: Secondary | ICD-10-CM | POA: Insufficient documentation

## 2021-10-15 DIAGNOSIS — Z79899 Other long term (current) drug therapy: Secondary | ICD-10-CM | POA: Diagnosis not present

## 2021-10-15 LAB — CMP (CANCER CENTER ONLY)
ALT: 25 U/L (ref 0–44)
AST: 20 U/L (ref 15–41)
Albumin: 4 g/dL (ref 3.5–5.0)
Alkaline Phosphatase: 148 U/L — ABNORMAL HIGH (ref 38–126)
Anion gap: 8 (ref 5–15)
BUN: 9 mg/dL (ref 6–20)
CO2: 27 mmol/L (ref 22–32)
Calcium: 9.3 mg/dL (ref 8.9–10.3)
Chloride: 104 mmol/L (ref 98–111)
Creatinine: 0.63 mg/dL (ref 0.44–1.00)
GFR, Estimated: >60 mL/min (ref >60)
Glucose, Bld: 137 mg/dL — ABNORMAL HIGH (ref 70–99)
Potassium: 3.6 mmol/L (ref 3.5–5.1)
Sodium: 139 mmol/L (ref 135–145)
Total Bilirubin: 0.4 mg/dL (ref 0.3–1.2)
Total Protein: 7.6 g/dL (ref 6.5–8.1)

## 2021-10-15 LAB — CBC WITH DIFFERENTIAL (CANCER CENTER ONLY)
Abs Immature Granulocytes: 0.02 10*3/uL (ref 0.00–0.07)
Basophils Absolute: 0 10*3/uL (ref 0.0–0.1)
Basophils Relative: 0 %
Eosinophils Absolute: 0.1 10*3/uL (ref 0.0–0.5)
Eosinophils Relative: 1 %
HCT: 32.1 % — ABNORMAL LOW (ref 36.0–46.0)
Hemoglobin: 10.5 g/dL — ABNORMAL LOW (ref 12.0–15.0)
Immature Granulocytes: 0 %
Lymphocytes Relative: 15 %
Lymphs Abs: 1.1 10*3/uL (ref 0.7–4.0)
MCH: 33.2 pg (ref 26.0–34.0)
MCHC: 32.7 g/dL (ref 30.0–36.0)
MCV: 101.6 fL — ABNORMAL HIGH (ref 80.0–100.0)
Monocytes Absolute: 0.5 10*3/uL (ref 0.1–1.0)
Monocytes Relative: 7 %
Neutro Abs: 5.6 10*3/uL (ref 1.7–7.7)
Neutrophils Relative %: 77 %
Platelet Count: 131 10*3/uL — ABNORMAL LOW (ref 150–400)
RBC: 3.16 MIL/uL — ABNORMAL LOW (ref 3.87–5.11)
RDW: 15.9 % — ABNORMAL HIGH (ref 11.5–15.5)
WBC Count: 7.2 10*3/uL (ref 4.0–10.5)
nRBC: 0 % (ref 0.0–0.2)

## 2021-10-15 MED ORDER — DIPHENHYDRAMINE HCL 25 MG PO CAPS
50.0000 mg | ORAL_CAPSULE | Freq: Once | ORAL | Status: AC
  Administered 2021-10-15: 50 mg via ORAL
  Filled 2021-10-15: qty 2

## 2021-10-15 MED ORDER — TRASTUZUMAB-DKST CHEMO 150 MG IV SOLR
6.0000 mg/kg | Freq: Once | INTRAVENOUS | Status: AC
  Administered 2021-10-15: 336 mg via INTRAVENOUS
  Filled 2021-10-15: qty 16

## 2021-10-15 MED ORDER — SODIUM CHLORIDE 0.9% FLUSH
10.0000 mL | INTRAVENOUS | Status: DC | PRN
  Administered 2021-10-15: 10 mL

## 2021-10-15 MED ORDER — ACETAMINOPHEN 325 MG PO TABS
650.0000 mg | ORAL_TABLET | Freq: Once | ORAL | Status: AC
  Administered 2021-10-15: 650 mg via ORAL
  Filled 2021-10-15: qty 2

## 2021-10-15 MED ORDER — HEPARIN SOD (PORK) LOCK FLUSH 100 UNIT/ML IV SOLN
500.0000 [IU] | Freq: Once | INTRAVENOUS | Status: AC | PRN
  Administered 2021-10-15: 500 [IU]

## 2021-10-15 MED ORDER — SODIUM CHLORIDE 0.9 % IV SOLN: Freq: Once | INTRAVENOUS | Status: AC

## 2021-10-15 NOTE — Patient Instructions (Signed)
[facility]COLOG[facility]  Discharge Instructions: ?Thank you for choosing [facility] to provide your oncology and hematology care.  ? ?If you have a lab appointment with the Cancer Center, please go directly to the Cancer Center and check in at the registration area. ?  ?Wear comfortable clothing and clothing appropriate for easy access to any Portacath or PICC line.  ? ?We strive to give you quality time with your provider. You may need to reschedule your appointment if you arrive late (15 or more minutes).  Arriving late affects you and other patients whose appointments are after yours.  Also, if you miss three or more appointments without notifying the office, you may be dismissed from the clinic at the provider?s discretion.    ?  ?For prescription refill requests, have your pharmacy contact our office and allow 72 hours for refills to be completed.   ? ?Today you received the following chemotherapy and/or immunotherapy agents: Trastuzumab    ?  ?To help prevent nausea and vomiting after your treatment, we encourage you to take your nausea medication as directed. ? ?BELOW ARE SYMPTOMS THAT SHOULD BE REPORTED IMMEDIATELY: ?*FEVER GREATER THAN 100.4 F (38 ?C) OR HIGHER ?*CHILLS OR SWEATING ?*NAUSEA AND VOMITING THAT IS NOT CONTROLLED WITH YOUR NAUSEA MEDICATION ?*UNUSUAL SHORTNESS OF BREATH ?*UNUSUAL BRUISING OR BLEEDING ?*URINARY PROBLEMS (pain or burning when urinating, or frequent urination) ?*BOWEL PROBLEMS (unusual diarrhea, constipation, pain near the anus) ?TENDERNESS IN MOUTH AND THROAT WITH OR WITHOUT PRESENCE OF ULCERS (sore throat, sores in mouth, or a toothache) ?UNUSUAL RASH, SWELLING OR PAIN  ?UNUSUAL VAGINAL DISCHARGE OR ITCHING  ? ?Items with * indicate a potential emergency and should be followed up as soon as possible or go to the Emergency Department if any problems should occur. ? ?Please show the CHEMOTHERAPY ALERT CARD or IMMUNOTHERAPY ALERT CARD at check-in  to the Emergency Department and triage nurse. ? ?Should you have questions after your visit or need to cancel or reschedule your appointment, please contact [facility]  Dept: [phone]  and follow the prompts.  Office hours are 8:00 a.m. to 4:30 p.m. Monday - Friday. Please note that voicemails left after 4:00 p.m. may not be returned until the following business day.  We are closed weekends and major holidays. You have access to a nurse at all times for urgent questions. Please call the main number to the clinic Dept: [phone] and follow the prompts. ? ? ?For any non-urgent questions, you may also contact your provider using MyChart. We now offer e-Visits for anyone 18 and older to request care online for non-urgent symptoms. For details visit [email or web address]. ?  ?Also download the MyChart app! Go to the app store, search "MyChart", open the app, select [facility], and log in with your MyChart username and password. ? ?Due to Covid, a mask is required upon entering the hospital/clinic. If you do not have a mask, one will be given to you upon arrival. For doctor visits, patients may have 1 support person aged 18 or older with them. For treatment visits, patients cannot have anyone with them due to current Covid guidelines and our immunocompromised population.  ? ?

## 2021-10-22 ENCOUNTER — Other Ambulatory Visit: Payer: Self-pay | Admitting: Radiation Oncology

## 2021-10-23 ENCOUNTER — Telehealth: Payer: Self-pay | Admitting: *Deleted

## 2021-10-23 NOTE — Telephone Encounter (Signed)
Received call from pt with complaint of sore throat and difficulty swallowing x several days.  RN reviewed with MD who states pt has hx of severe GERD.  MD recommends pt be seen by [facility] for further evaluation.  Appt scheduled and pt verbalized understanding of date and time.

## 2021-10-24 ENCOUNTER — Other Ambulatory Visit: Payer: Self-pay

## 2021-10-24 ENCOUNTER — Inpatient Hospital Stay: Payer: Medicaid Other

## 2021-10-24 ENCOUNTER — Ambulatory Visit: Payer: Medicaid Other | Admitting: Dietician

## 2021-10-24 ENCOUNTER — Encounter: Payer: Self-pay | Admitting: Adult Health

## 2021-10-24 ENCOUNTER — Ambulatory Visit (HOSPITAL_COMMUNITY)
Admission: RE | Admit: 2021-10-24 | Discharge: 2021-10-24 | Disposition: A | Discharge status: Home / Self Care | Payer: Medicaid Other | Source: Ambulatory Visit | Attending: Adult Health | Admitting: Adult Health

## 2021-10-24 ENCOUNTER — Inpatient Hospital Stay (HOSPITAL_BASED_OUTPATIENT_CLINIC_OR_DEPARTMENT_OTHER): Payer: Medicaid Other | Admitting: Adult Health

## 2021-10-24 VITALS — BP 104/65 | HR 80 | Resp 16

## 2021-10-24 VITALS — BP 131/75 | HR 108 | Temp 97.7°F | Resp 16 | Ht 61.0 in | Wt 120.7 lb

## 2021-10-24 DIAGNOSIS — Z171 Estrogen receptor negative status [ER-]: Secondary | ICD-10-CM | POA: Insufficient documentation

## 2021-10-24 DIAGNOSIS — Z95828 Presence of other vascular implants and grafts: Secondary | ICD-10-CM

## 2021-10-24 DIAGNOSIS — C50512 Malignant neoplasm of lower-outer quadrant of left female breast: Secondary | ICD-10-CM

## 2021-10-24 DIAGNOSIS — C7949 Secondary malignant neoplasm of other parts of nervous system: Secondary | ICD-10-CM | POA: Diagnosis not present

## 2021-10-24 DIAGNOSIS — C5701 Malignant neoplasm of right fallopian tube: Secondary | ICD-10-CM

## 2021-10-24 DIAGNOSIS — Z5112 Encounter for antineoplastic immunotherapy: Secondary | ICD-10-CM | POA: Diagnosis not present

## 2021-10-24 LAB — CBC WITH DIFFERENTIAL (CANCER CENTER ONLY)
Abs Immature Granulocytes: 0.05 10*3/uL (ref 0.00–0.07)
Basophils Absolute: 0 10*3/uL (ref 0.0–0.1)
Basophils Relative: 0 %
Eosinophils Absolute: 0 10*3/uL (ref 0.0–0.5)
Eosinophils Relative: 0 %
HCT: 33.2 % — ABNORMAL LOW (ref 36.0–46.0)
Hemoglobin: 10.8 g/dL — ABNORMAL LOW (ref 12.0–15.0)
Immature Granulocytes: 1 %
Lymphocytes Relative: 9 %
Lymphs Abs: 0.9 10*3/uL (ref 0.7–4.0)
MCH: 32.8 pg (ref 26.0–34.0)
MCHC: 32.5 g/dL (ref 30.0–36.0)
MCV: 100.9 fL — ABNORMAL HIGH (ref 80.0–100.0)
Monocytes Absolute: 0.5 10*3/uL (ref 0.1–1.0)
Monocytes Relative: 6 %
Neutro Abs: 8.3 10*3/uL — ABNORMAL HIGH (ref 1.7–7.7)
Neutrophils Relative %: 84 %
Platelet Count: 164 10*3/uL (ref 150–400)
RBC: 3.29 MIL/uL — ABNORMAL LOW (ref 3.87–5.11)
RDW: 15.9 % — ABNORMAL HIGH (ref 11.5–15.5)
WBC Count: 9.8 10*3/uL (ref 4.0–10.5)
nRBC: 0 % (ref 0.0–0.2)

## 2021-10-24 LAB — CMP (CANCER CENTER ONLY)
ALT: 43 U/L (ref 0–44)
AST: 28 U/L (ref 15–41)
Albumin: 4.3 g/dL (ref 3.5–5.0)
Alkaline Phosphatase: 138 U/L — ABNORMAL HIGH (ref 38–126)
Anion gap: 9 (ref 5–15)
BUN: 14 mg/dL (ref 6–20)
CO2: 26 mmol/L (ref 22–32)
Calcium: 9.3 mg/dL (ref 8.9–10.3)
Chloride: 102 mmol/L (ref 98–111)
Creatinine: 0.79 mg/dL (ref 0.44–1.00)
GFR, Estimated: >60 mL/min (ref >60)
Glucose, Bld: 109 mg/dL — ABNORMAL HIGH (ref 70–99)
Potassium: 3.7 mmol/L (ref 3.5–5.1)
Sodium: 137 mmol/L (ref 135–145)
Total Bilirubin: 0.6 mg/dL (ref 0.3–1.2)
Total Protein: 8.2 g/dL — ABNORMAL HIGH (ref 6.5–8.1)

## 2021-10-24 LAB — MAGNESIUM: Magnesium: 2.1 mg/dL (ref 1.7–2.4)

## 2021-10-24 MED ORDER — IOHEXOL 300 MG/ML  SOLN
100.0000 mL | Freq: Once | INTRAMUSCULAR | Status: AC | PRN
  Administered 2021-10-24: 100 mL via INTRAVENOUS

## 2021-10-24 MED ORDER — HEPARIN SOD (PORK) LOCK FLUSH 100 UNIT/ML IV SOLN
500.0000 [IU] | Freq: Once | INTRAVENOUS | Status: AC
  Administered 2021-10-24: 500 [IU] via INTRAVENOUS

## 2021-10-24 MED ORDER — SODIUM CHLORIDE (PF) 0.9 % IJ SOLN
INTRAMUSCULAR | Status: AC
  Filled 2021-10-24: qty 50

## 2021-10-24 MED ORDER — SODIUM CHLORIDE 0.9% FLUSH
10.0000 mL | Freq: Once | INTRAVENOUS | Status: AC
  Administered 2021-10-24: 10 mL via INTRAVENOUS

## 2021-10-24 MED ORDER — IOHEXOL 9 MG/ML PO SOLN
ORAL | Status: AC
  Filled 2021-10-24: qty 1000

## 2021-10-24 MED ORDER — SODIUM CHLORIDE 0.9 % IV SOLN: Freq: Once | INTRAVENOUS | Status: AC

## 2021-10-24 NOTE — Patient Instructions (Signed)

## 2021-10-24 NOTE — Progress Notes (Signed)
[facility] Follow up:    Erin Pounds, NP [place] [address]   DIAGNOSIS:  Cancer Staging  Fallopian tube cancer, carcinoma, right [facility]) Staging form: Ovary, Fallopian Tube, and Primary Peritoneal Carcinoma, AJCC 8th Edition - Clinical stage from 11/04/2017: FIGO Stage I, calculated as Stage Unknown (cT1c2, cNX, cM0) - Signed by [name], MD on 11/22/2017 Histopathologic type: Serous cystadenocarcinoma, NOS Histologic grade (G): G3 Histologic grading system: 4 grade system Laterality: Right Tumor size (mm): 6 Lymph-vascular invasion (LVI): Presence of LVI unknown/indeterminate Diagnostic confirmation: Positive histology Specimen type: Excision Staged by: Pathologist Stage used in treatment planning: Yes National guidelines used in treatment planning: Yes Type of national guideline used in treatment planning: NCCN  Malignant neoplasm of lower-outer quadrant of left breast of female, estrogen receptor negative ([place]) Staging form: Breast, AJCC 8th Edition - Clinical stage from 05/18/2017: Stage IA (cT1b, cN0, cM0, G3, ER-, PR-, HER2+) - Unsigned Method of lymph node assessment: Clinical Histologic grading system: 3 grade system Laterality: Left Tumor size (mm): 10 - Pathologic: Stage IA (pT1c, pN0, cM0, G3, ER-, PR-, HER2+) - Signed by [name], NP on 07/07/2017 Histologic grading system: 3 grade system   SUMMARY OF ONCOLOGIC HISTORY: Oncology History  Malignant neoplasm of lower-outer quadrant of left breast of female, estrogen receptor negative ([place])  05/11/2017 Initial Diagnosis   Left breast biopsy 3:30 position 6 cm from nipple: IDC with DCIS, lymphovascular invasion present, grade 2-3, ER 0%, PR 0%, HER-2 positive ratio 2.28, Ki-67 20%, 1 cm lesion left breast, T1b N0 stage IA clinical stage   06/09/2017 Surgery   Left lumpectomy: IDC grade 3, 1.2 cm, DCIS is present, margins negative, 0/6 lymph nodes negative,  ER 0%, PR 0%, HER-2 positive ratio 2.28, Ki-67 20%, T1 BN 0 stage IA   07/09/2017 - 09/29/2017 Adjuvant Chemotherapy   Taxol/Herceptin x 12 completed on 09/29/2017, went on to receive every 3 week adjuvant Herceptin through 06/28/2018   11/25/2017 - 01/07/2018 Radiation Therapy    The patient initially received a dose of 50.4 Gy in 28 fractions to the breast using whole-breast tangent fields. This was delivered using a 3-D conformal technique. The patient then received a boost to the seroma. This delivered an additional 10 Gy in 5 fractions using a 3-D technique. The total dose was 60.4 Gy.   02/15/2018 Genetic Testing   The Common Hereditary Cancer Panel offered by [facility] includes sequencing and/or deletion duplication testing of the following 47 genes: APC, ATM, AXIN2, BARD1, BMPR1A, BRCA1, BRCA2, BRIP1, CDH1, CDKN2A (p14ARF), CDKN2A (p16INK4a), CKD4, CHEK2, CTNNA1, DICER1, EPCAM (Deletion/duplication testing only), GREM1 (promoter region deletion/duplication testing only), KIT, MEN1, MLH1, MSH2, MSH3, MSH6, MUTYH, NBN, NF1, NHTL1, PALB2, PDGFRA, PMS2, POLD1, POLE, PTEN, RAD50, RAD51C, RAD51D, SDHB, SDHC, SDHD, SMAD4, SMARCA4. STK11, TP53, TSC1, TSC2, and VHL.  The following genes were evaluated for sequence changes only: SDHA and HOXB13 c.251G>A variant only.   Results: POSITIVE. 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 The date of this test report is 02/15/2018   10/20/2019 PET scan   Bone mets involving Right hip, Bil Iliac bones, T8, T9, L2 and L3, Rt prox humerus, Righ glenoid. Some lytic and some sclerotic. Mild Left supraclav adenopathy, 2 small lesions basal ganglia (could be lacunar infarcts)   11/14/2019 - 12/26/2019 Chemotherapy   The patient had palonosetron (ALOXI) injection 0.25 mg, 0.25 mg, Intravenous,  Once, 2 of 5 cycles Administration: 0.25 mg (12/05/2019), 0.25 mg (12/26/2019) ado-trastuzumab  emtansine (KADCYLA) 260 mg in sodium chloride 0.9 % 250 mL chemo  infusion, 3.6 mg/kg = 260 mg, Intravenous, Once, 3 of 6 cycles Administration: 260 mg (11/14/2019), 260 mg (12/05/2019), 260 mg (12/26/2019)   for chemotherapy treatment.     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  Required TPN briefly.  Patient was able to swallow and keep food down by the time of discharge.   02/06/2020 Miscellaneous   Talazoparib   07/15/2021 - 07/15/2021 Chemotherapy   Patient is on Treatment Plan : Intrathecal Methotrexate     08/14/2021 -  Chemotherapy   Patient is on Treatment Plan : BREAST Capecitabine + Trastuzumab + Tucatinib q21d     Fallopian tube cancer, carcinoma, right ([place])  11/04/2017 Surgery   Uterus, cervix and bilateral fallopian tubes: Fallopian tube with serous carcinoma 0.6 cm positive for p53, PAX8, WT-1, MOC-31, cytokeratin 7, and estrogen receptor   01/11/2018 -  Chemotherapy   Taxol and carboplatin every 3 weeks (Taxol discontinued with cycle 2 because of profound rash)   10/20/2019 PET scan   Bone mets involving Right hip, Bil Iliac bones, T8, T9, L2 and L3, Rt prox humerus, Righ glenoid. Some lytic and some sclerotic. Mild Left supraclav adenopathy, 2 small lesions basal ganglia (could be lacunar infarcts)   11/14/2019 - 12/26/2019 Chemotherapy   The patient had palonosetron (ALOXI) injection 0.25 mg, 0.25 mg, Intravenous,  Once, 2 of 5 cycles Administration: 0.25 mg (12/05/2019), 0.25 mg (12/26/2019) ado-trastuzumab emtansine (KADCYLA) 260 mg in sodium chloride 0.9 % 250 mL chemo infusion, 3.6 mg/kg = 260 mg, Intravenous, Once, 3 of 6 cycles Administration: 260 mg (11/14/2019), 260 mg (12/05/2019), 260 mg (12/26/2019)   for chemotherapy treatment.     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 female returns for evaluation of persistent vomiting.  She says this has been worsening somewhat since 08/2021 when she completed radiation.  She is eating and drinking less, and is feeling worse.  She denies abdominal pain.  She has no fevers or chills.  She had a BM 3 days ago which was hard.    She denies any new pain other than a slight increase in headaches.  She is having no vision changes, abdominal pain, bladder changes, or abdominal cramping.     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[place]) 11/03/2019   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[place]) 11/03/2019   Elevated CA-125 10/09/2019  Paresthesia 04/17/2019   BRCA2 gene mutation positive 02/17/2018   Genetic testing 02/17/2018   Family history of breast cancer    Family history of colon cancer    Fallopian tube cancer, carcinoma, right ([place]) 11/22/2017   S/P vaginal hysterectomy 11/04/2017   Ovarian mass, left 08/26/2017   Menorrhagia 08/26/2017   Port-A-Cath in place 07/23/2017   Malignant neoplasm of lower-outer quadrant of left breast of female, estrogen receptor negative ([place])  05/13/2017   Abdominal bloating 04/02/2017   Chronic right-sided low back pain without sciatica 07/02/2016   DM type 2 (diabetes mellitus, type 2) ([place]) 11/18/2014   Intermittent constipation 06/06/2010   UNSPECIFIED IRON DEFICIENCY ANEMIA 04/22/2010   DEPRESSION 04/10/2010   Hearing loss 04/10/2010    has No Known Allergies.  MEDICAL HISTORY: Past Medical History:  Diagnosis Date   Arthritis    knees, elbows   Borderline glaucoma of right eye    BRCA2 gene mutation positive 02/17/2018   BRCA2 c.4552del (p.Glu1518Asnfs*25)  Result reported out on 02/15/2018.    Breast cancer ([place]) 05/2017   left   Chronic back pain    Deaf    per pt born hearing and at age 16 1/2 lost hearing , was told by mother unknown cause, can miminally hear in left and no hearing on right    Depression    Elevated cancer antigen 125 (CA 125)    Family history of breast cancer    Family history of breast cancer    Family history of colon cancer    Genetic testing 02/17/2018   The Common Hereditary Cancer Panel offered by [facility] includes sequencing and/or deletion duplication testing of the following 47 genes: APC, ATM, AXIN2, BARD1, BMPR1A, BRCA1, BRCA2, BRIP1, CDH1, CDKN2A (p14ARF), CDKN2A (p16INK4a), CKD4, CHEK2, CTNNA1, DICER1, EPCAM (Deletion/duplication testing only), GREM1 (promoter region deletion/duplication testing only), KIT, MEN1, MLH1, MSH2, MSH3, MSH6, MU   GERD (gastroesophageal reflux disease)    History of cancer chemotherapy    left breast cancer 09-08-2017 to 10-09-2017;  fallopion tube cancer  12-15-2017  to 06-28-2018   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 oncologist-  dr [name]   11-04-2017  s/p  LAVH w/ BSO,  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;  completed chemo 06-28-2018   History of external beam radiation therapy    left breast  11-25-2017  to 01-07-2018   Hyperlipidemia    Hypertension    followed by pcp   (10-05-2019  per pt  never had stress test)   IDA (iron deficiency anemia)    Malignant neoplasm of lower-outer quadrant of left breast of female, estrogen receptor negative [facility]) oncologist-- dr [name]   dx 08/ 2018--- Stage IA, DCIS,  ER/ PR negative,  HER-2 positive;  06-09-2017 s/p left breast lumpectomy with node dissection;  completed chemo 10-09-2017  and radiation 01-07-2018/  hercepton completed 06-28-2018   Non-insulin dependent type 2 diabetes mellitus ([place])    followed by pcp   (10-05-2019 per pt check cbg every other day in AM,  fasting cbg-- 105)   Numbness of right thumb    Wears glasses     SURGICAL HISTORY: Past Surgical History:  Procedure Laterality Date   BREAST LUMPECTOMY WITH RADIOACTIVE SEED AND SENTINEL LYMPH NODE BIOPSY Left 06/09/2017   Procedure: BREAST LUMPECTOMY WITH RADIOACTIVE SEED AND SENTINEL LYMPH NODE BIOPSY;  Surgeon: Excell Seltzer, MD;  Location: [place];  Service: General;  Laterality: Left;  COLONOSCOPY  11/02/2017   polyps   HERNIA REPAIR     HIATAL HERNIA REPAIR  09/2019   IR RADIOLOGIST EVAL & MGMT  07/09/2021   IR RADIOLOGIST EVAL & MGMT  07/24/2021   IR RADIOLOGIST EVAL & MGMT  09/30/2021   LAPAROSCOPIC ASSISTED VAGINAL HYSTERECTOMY N/A 11/04/2017   Procedure: LAPAROSCOPIC ASSISTED VAGINAL HYSTERECTOMY;  Surgeon: [name], MD;  Location: [facility];  Service: Gynecology;  Laterality: N/A;   LAPAROSCOPIC BILATERAL SALPINGO OOPHERECTOMY Bilateral 11/04/2017   Procedure: LAPAROSCOPIC BILATERAL SALPINGO OOPHORECTOMY;  Surgeon: [name], MD;  Location: [facility];  Service: Gynecology;  Laterality: Bilateral;   LAPAROSCOPY N/A 10/09/2019   Procedure: LAPAROSCOPY DIAGNOSTIC WITH PERITONEAL BIOPSIES;  Surgeon: [name], MD;  Location: [facility];  Service: Gynecology;  Laterality: N/A;   PORTACATH PLACEMENT Right 06/09/2017   Procedure: INSERTION PORT-A-CATH WITH [place];  Surgeon: Excell Seltzer, MD;  Location: [place];  Service: General;  Laterality: Right;   PORTACATH PLACEMENT Right 11/13/2019   Procedure: INSERTION PORT-A-CATH WITH ULTRASOUND GUIDANCE;  Surgeon: Rolm Bookbinder, MD;  Location: [place];  Service: General;  Laterality: Right;   RADIOLOGY WITH ANESTHESIA N/A 08/27/2021   Procedure: IR WITH ANESTHESIA MICROWAVE ABLATION OF LIVER;  Surgeon: Aletta Edouard, MD;  Location: [facility];  Service: Radiology;  Laterality: N/A;   TUBAL LIGATION  02/02/2002   _0    PPTL   UPPER GI ENDOSCOPY      SOCIAL HISTORY: Social History   Socioeconomic History   Marital status: Legally Separated    Spouse name: Not on file   Number of children: 2   Years of education: Not on file   Highest education level: Not on file  Occupational History   Not on file  Tobacco Use   Smoking status: Never   Smokeless tobacco: Never  Vaping Use   Vaping Use: Never used  Substance and Sexual Activity   Alcohol use: Not Currently    Comment: very rare   Drug use: Never   Sexual activity: Not Currently    Birth control/protection: Surgical  Other Topics Concern   Not on file  Social History Narrative   1 boy and 1 girl   Resides in [place]   Seperated   Social Determinants of Health   Financial Resource Strain: Not on file  Food Insecurity: Not on file  Transportation Needs: Not on file  Physical Activity: Not on file  Stress: Not on file  Social Connections: Not on file  Intimate Partner Violence: Not on file    FAMILY HISTORY: Family History  Problem Relation Age of Onset   Hypertension Mother    Lupus Mother    Diabetes Father    Hypertension Sister    Diabetes Paternal Grandmother    Colon cancer Paternal Grandmother 102   CAD Brother    Diabetes Brother    Heart attack Brother    Breast cancer Maternal Aunt        dx >50   Heart attack Paternal Grandfather    Breast cancer Maternal Aunt        dx under 75   Breast cancer Maternal Aunt        dx  under 50     Review of Systems  Constitutional:  Positive for fatigue. Negative for appetite change, chills, fever and unexpected weight change.  HENT:   Negative for hearing loss, lump/mass and trouble swallowing.   Eyes:  Negative for eye problems  and icterus.  Respiratory:  Negative for chest tightness, cough and shortness of breath.   Cardiovascular:  Negative for chest pain, leg swelling and palpitations.  Gastrointestinal:  Positive for constipation, nausea and vomiting. Negative for abdominal distention, abdominal pain and diarrhea.  Endocrine: Negative for hot flashes.  Genitourinary:  Negative for difficulty urinating.   Musculoskeletal:  Negative for arthralgias and gait problem.  Skin:  Negative for itching and rash.  Neurological:  Positive for headaches. Negative for dizziness, extremity weakness, gait problem, light-headedness, numbness, seizures and speech difficulty.  Hematological:  Negative for adenopathy. Does not bruise/bleed easily.  Psychiatric/Behavioral:  Negative for depression. The patient is not nervous/anxious.      PHYSICAL EXAMINATION  ECOG PERFORMANCE STATUS: 1 - Symptomatic but completely ambulatory  Vitals:   10/24/21 0919  BP: 131/75  Pulse: (!) 108  Resp: 16  Temp: 97.7 F (36.5 C)  SpO2: 100%    Physical Exam Constitutional:      General: She is not in acute distress.    Appearance: Normal appearance. She is not toxic-appearing.  HENT:     Head: Normocephalic and atraumatic.  Eyes:     General: No scleral icterus. Cardiovascular:     Rate and Rhythm: Normal rate and regular rhythm.     Pulses: Normal pulses.     Heart sounds: Normal heart sounds.  Pulmonary:     Effort: Pulmonary effort is normal.     Breath sounds: Normal breath sounds.  Abdominal:     General: Abdomen is flat. Bowel sounds are normal. There is no distension.     Palpations: Abdomen is soft.     Tenderness: There is no abdominal tenderness.  Musculoskeletal:         General: No swelling.     Cervical back: Neck supple.  Lymphadenopathy:     Cervical: No cervical adenopathy.  Skin:    General: Skin is warm and dry.     Findings: No rash.  Neurological:     General: No focal deficit present.     Mental Status: She is alert.  Psychiatric:        Mood and Affect: Mood normal.        Behavior: Behavior normal.    LABORATORY DATA:  CBC    Component Value Date/Time   WBC 9.8 10/24/2021 1056   WBC 6.6 09/16/2021 1742   RBC 3.29 (L) 10/24/2021 1056   HGB 10.8 (L) 10/24/2021 1056   HGB 10.9 (L) 04/04/2021 0920   HGB 9.3 (L) 09/15/2017 0800   HCT 33.2 (L) 10/24/2021 1056   HCT 31.5 (L) 04/04/2021 0920   HCT 28.2 (L) 09/15/2017 0800   PLT 164 10/24/2021 1056   PLT 65 (LL) 04/04/2021 0920   MCV 100.9 (H) 10/24/2021 1056   MCV 102 (H) 04/04/2021 0920   MCV 82.4 09/15/2017 0800   MCH 32.8 10/24/2021 1056   MCHC 32.5 10/24/2021 1056   RDW 15.9 (H) 10/24/2021 1056   RDW 14.6 04/04/2021 0920   RDW 20.8 (H) 09/15/2017 0800   LYMPHSABS 0.9 10/24/2021 1056   LYMPHSABS 1.2 04/04/2021 0920   LYMPHSABS 2.5 09/15/2017 0800   MONOABS 0.5 10/24/2021 1056   MONOABS 0.8 09/15/2017 0800   EOSABS 0.0 10/24/2021 1056   EOSABS 0.0 04/04/2021 0920   BASOSABS 0.0 10/24/2021 1056   BASOSABS 0.0 04/04/2021 0920   BASOSABS 0.0 09/15/2017 0800    CMP     Component Value Date/Time   NA 137 10/24/2021 1056  NA 141 04/04/2021 0920   NA 139 09/15/2017 0800   K 3.7 10/24/2021 1056   K 3.8 09/15/2017 0800   CL 102 10/24/2021 1056   CO2 26 10/24/2021 1056   CO2 23 09/15/2017 0800   GLUCOSE 109 (H) 10/24/2021 1056   GLUCOSE 113 09/15/2017 0800   BUN 14 10/24/2021 1056   BUN 11 04/04/2021 0920   BUN 8.7 09/15/2017 0800   CREATININE 0.79 10/24/2021 1056   CREATININE 0.7 09/15/2017 0800   CALCIUM 9.3 10/24/2021 1056   CALCIUM 9.2 09/15/2017 0800   PROT 8.2 (H) 10/24/2021 1056   PROT 7.2 04/04/2021 0920   PROT 7.3 09/15/2017 0800   ALBUMIN 4.3 10/24/2021  1056   ALBUMIN 4.2 04/04/2021 0920   ALBUMIN 3.3 (L) 09/15/2017 0800   AST 28 10/24/2021 1056   AST 13 09/15/2017 0800   ALT 43 10/24/2021 1056   ALT 19 09/15/2017 0800   ALKPHOS 138 (H) 10/24/2021 1056   ALKPHOS 74 09/15/2017 0800   BILITOT 0.6 10/24/2021 1056   BILITOT 0.27 09/15/2017 0800   GFRNONAA >60 10/24/2021 1056   GFRAA >60 06/14/2020 0821       ASSESSMENT and THERAPY PLAN:   Malignant neoplasm of lower-outer quadrant of left breast of female, estrogen receptor negative ([place]) Left lumpectomy: IDC grade 3, 1.2 cm, DCIS is present, margins negative, 0/6 lymph nodes negative, ER 0%, PR 0%, HER-2 positive ratio 2.28, Ki-67 20%, T1 BN 0 stage IA S/P Taxol-Herceptin BRCA 2 Mutation Positive   10/20/19: Bone mets involving Right hip, Bil Iliac bones, T8, T9, L2 and L3, Rt prox humerus, Righ glenoid. Some lytic and some sclerotic. Mild Left supraclav adenopathy, 2 small lesions basal ganglia (could be lacunar infarcts)   Biopsy the supra clav LN: 10/31/2019: Metastatic breast cancer, ER/PR negative HER-2 equivocal by IHC, FISH positive Treatment summary: Kadcyla every 3-week treatment.  Discontinued for progression   MRI of the back 01/07/2020: Extensive metastatic disease to the involved spine.  Tumor replacement of L3 vertebral body with pathologic compression fracture, epidural and extraosseous extension.  Tumor extending throughout L3 foramen Status post palliative radiation therapy Causing severe dysphagia and dehydration and hospitalization 01/29/2020-02/03/2020 Talazoparib started 02/07/2020  CT CAP 09/20/19: No evidence of ST mets, tiny inferior right hepatic lesion substantially decreased (3 mm), bone mets have become sclerotic suggest healing CT CAP 12/26/2020: Stable diffuse bone mets CT CAP 07/01/2021: Stable bone metastases.  New liver lesion 1.5 cm Brain MRI 07/12/2021: Abnormal cerebellar signal suspicious for leptomeningeal spread. Lumbar puncture 07/15/2021: Highly  suspicious for leptomeningeal carcinomatosis  08/12/2021 through 08/25/2021 whole brain radiation therapy was completed Tucatinib plus Xeloda plus Herceptin based on HER-2 Climb study beginning 12 1, 2022. Liver ablation was completed on August 27, 2021 ------------------------------------------------------------------------------------------------------------------------------------------------------ Current treatment: Tucatinib, capecitabine, trastuzumab (Tucatinib held for nausea and diarrhea), Xeloda discontinued   Toxicities: 1. Profound Nausea: With severe acid reflux: She is not able to take any oral medication. She will receive IV fluids daily.  2. I recommended she stop Tucatinib.   3. Repeat scans of CT chest/abd/pelvis to r/o progression causing obstruction, repeat MRI brain  She will return tomorrow for fluids and early next week for f/u.        Orders Placed This Encounter  Procedures   CT CHEST ABDOMEN PELVIS W CONTRAST    Standing Status:   Future    Number of Occurrences:   1    Standing Expiration Date:   10/24/2022    Order Specific Question:  Is patient pregnant?    Answer:   No    Order Specific Question:   Preferred imaging location?    Answer:   [facility]    Order Specific Question:   Is Oral Contrast requested for this exam?    Answer:   No oral contrast    Order Specific Question:   Reason for No Oral Contrast    Answer:   Not tolerating liquids   MR Brain W Wo Contrast    Standing Status:   Future    Standing Expiration Date:   10/24/2022    Order Specific Question:   If indicated for the ordered procedure, I authorize the administration of contrast media per Radiology protocol    Answer:   Yes    Order Specific Question:   What is the patient's sedation requirement?    Answer:   No Sedation    Order Specific Question:   Does the patient have a pacemaker or implanted devices?    Answer:   No    Order Specific Question:   Use SRS Protocol?     Answer:   No    Order Specific Question:   Preferred imaging location?    Answer:   [facility] (table limit - 550 lbs)   CBC with Differential (Cancer Center Only)    Standing Status:   Future    Number of Occurrences:   1    Standing Expiration Date:   10/24/2022   CMP ([place] only)    Standing Status:   Future    Number of Occurrences:   1    Standing Expiration Date:   10/24/2022   Magnesium    Standing Status:   Future    Number of Occurrences:   1    Standing Expiration Date:   10/24/2022   Ambulatory Referral to [facility] Nutrition    Referral Priority:   Urgent    Referral Type:   Consultation    Referral Reason:   Specialty Services Required    Number of Visits Requested:   1    All questions were answered. The patient knows to call the clinic with any problems, questions or concerns. We can certainly see the patient much sooner if necessary.  Total encounter time: 30 minutes in face to face visit time, chart review, lab review, care coordination, or documentation of the encounter.    Wilber Bihari, NP 10/25/21 2:49 PM Medical Oncology and Hematology [facility] [place], [place] [address] Tel. [phone]    Fax. [phone]  *Total Encounter Time as defined by the Centers for Medicare and Medicaid Services includes, in addition to the face-to-face time of a patient visit (documented in the note above) non-face-to-face time: obtaining and reviewing outside history, ordering and reviewing medications, tests or procedures, care coordination (communications with other health care professionals or caregivers) and documentation in the medical record.

## 2021-10-24 NOTE — Assessment & Plan Note (Addendum)
Left lumpectomy: IDC grade 3, 1.2 cm, DCIS is present, margins negative, 0/6 lymph nodes negative, ER 0%, PR 0%, HER-2 positive ratio 2.28, Ki-67 20%, T1 BN 0 stage IA S/P Taxol-Herceptin BRCA 2 Mutation Positive  10/20/19:Bone mets involving Right hip, Bil Iliac bones, T8, T9, L2 and L3, Rt prox humerus, Righ glenoid. Some lytic and some sclerotic. Mild Left supraclav adenopathy, 2 small lesions basal ganglia (could be lacunar infarcts)  Biopsy the supra clav LN: 10/31/2019: Metastatic breast cancer, ER/PR negative HER-2 equivocal by IHC, FISHpositive Treatmentsummary: Kadcyla every 3-week treatment.Discontinued for progression  MRI of the back 01/07/2020: Extensive metastatic disease to the involved spine. Tumor replacement of L3 vertebral body with pathologic compression fracture, epidural and extraosseous extension. Tumor extending throughout L3 foramen Status post palliative radiation therapyCausing severe dysphagia and dehydration and hospitalization5/17/2021-02/03/2020 Talazoparib started 02/07/2020 CT CAP 09/20/19: No evidence of ST mets, tiny inferior right hepatic lesion substantially decreased(3 mm), bone mets have become sclerotic suggest healing CT CAP 12/26/2020:Stable diffuse bone mets CT CAP 07/01/2021: Stable bone metastases. New liver lesion 1.5 cm Brain MRI 07/12/2021: Abnormal cerebellar signal suspicious for leptomeningeal spread. Lumbar puncture 07/15/2021: Highly suspicious for leptomeningeal carcinomatosis 08/12/2021 through 08/25/2021 whole brain radiation therapy was completed Tucatinib plus Xeloda plus Herceptin based on HER-2 Climb study beginning 12 1, 2022. Liver ablation was completed on August 27, 2021 ------------------------------------------------------------------------------------------------------------------------------------------------------ Current treatment:Tucatinib, capecitabine, trastuzumab(Tucatinib held for nausea and diarrhea), Xeloda  discontinued  Toxicities: 1. Profound Nausea: With severe acid reflux: She is not able to take any oral medication. She will receive IV fluids daily.  2. I recommended she stop Tucatinib.   3. Repeat scans of CT chest/abd/pelvis to r/o progression causing obstruction, repeat MRI brain  She will return tomorrow for fluids and early next week for f/u.

## 2021-10-24 NOTE — Progress Notes (Signed)
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 She completed whole brain radiation 08/12/21-08/25/21. She is currently receiving Herceptin and Tucatinib (Tucatinib held due for nausea and diarrhea). Patient is followed by Dr. [name].   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   Met with patient and interpretor in infusion. Patient receiving IV fluids today. Per interpretor patient is to have CT scan later today. Patient reports significant history of GERD. She typically eats small frequent meals and avoids spicy, greasy, high fat foods. Patient denies nausea, reports sometimes foods just come right back up. Yesterday she had a good day, she tolerated potatoes and green beans without difficulty. This morning, she attempted to take medication but this came right back up. Patient is drinking Ensure and tolerates well. Patient likes the coffee flavor.   Nutrition Focused Physical Exam: unable to complete at this time   Medications: Pepcid, Lipitor, Phenergan, MVI, Imodium, Namenda, Compazine, Zofran ODT   Labs: Glucose 109   Anthropometrics: Weights have decreased 3.7% in the last 9 days. Patient weighed 125.4 lb in 2/1. Weights have trended down 16% (~23 lb) from usual weight in 3 months. This is significant  Height: 5'1" Weight: 120.7 lb  UBW: 143.6 lb (07/30/22) BMI: 22.81   NUTRITION DIAGNOSIS: Inadequate oral intake related to cancer and associated treatment side effects as evidenced by reported diarrhea and nausea as well as persistent vomiting s/p completion of radiation 08/25/21   INTERVENTION:  Encouraged small frequent meals and snacks with adequate calories and  protein Suggested remaining upright ~40 minutes after eating Educated on food that may increase symptoms (chocolate, coffee, fatty foods, acidic foods) Suggested waiting 30 minutes after nausea medication before eating Encouraged drinking 2-3 Ensure Plus/equivalent daily  One complimentary case of Ensure Plus High Protein provided today Could consider trial of Reglan    MONITORING, EVALUATION, GOAL: Patient will tolerate increased calories and protein to promote weight gain/stability    Next Visit: Wednesday March 15 during infusion

## 2021-10-24 NOTE — Progress Notes (Signed)
Per [name], NP OK to infuse 1L IVFs over 1 hour today

## 2021-10-25 ENCOUNTER — Other Ambulatory Visit: Payer: Self-pay

## 2021-10-25 ENCOUNTER — Inpatient Hospital Stay: Payer: Medicaid Other

## 2021-10-25 ENCOUNTER — Inpatient Hospital Stay (HOSPITAL_BASED_OUTPATIENT_CLINIC_OR_DEPARTMENT_OTHER): Payer: Medicaid Other | Admitting: *Deleted

## 2021-10-25 ENCOUNTER — Encounter: Payer: Self-pay | Admitting: Hematology and Oncology

## 2021-10-25 VITALS — BP 110/72 | HR 88 | Temp 98.1°F | Resp 18

## 2021-10-25 DIAGNOSIS — Z95828 Presence of other vascular implants and grafts: Secondary | ICD-10-CM

## 2021-10-25 DIAGNOSIS — Z5112 Encounter for antineoplastic immunotherapy: Secondary | ICD-10-CM | POA: Diagnosis not present

## 2021-10-25 MED ORDER — SODIUM CHLORIDE 0.9 % IV SOLN: Freq: Once | INTRAVENOUS | Status: AC

## 2021-10-25 MED ORDER — SODIUM CHLORIDE 0.9% FLUSH: 10.0000 mL | INTRAVENOUS | Status: DC | PRN

## 2021-10-25 MED ORDER — FAMOTIDINE IN NACL 20-0.9 MG/50ML-% IV SOLN
20.0000 mg | Freq: Once | INTRAVENOUS | Status: AC
  Administered 2021-10-25: 20 mg via INTRAVENOUS

## 2021-10-25 MED ORDER — HEPARIN SOD (PORK) LOCK FLUSH 100 UNIT/ML IV SOLN
500.0000 [IU] | Freq: Once | INTRAVENOUS | Status: AC | PRN
  Administered 2021-10-25: 500 [IU]

## 2021-10-25 NOTE — Progress Notes (Signed)
Pt tolerated fluids well. Ambulated out of facility with mother. Advised to call office for any other concerns.

## 2021-10-26 ENCOUNTER — Ambulatory Visit (HOSPITAL_COMMUNITY)
Admission: RE | Admit: 2021-10-26 | Discharge: 2021-10-26 | Disposition: A | Discharge status: Home / Self Care | Payer: Medicaid Other | Source: Ambulatory Visit | Attending: Adult Health | Admitting: Adult Health

## 2021-10-26 DIAGNOSIS — Z171 Estrogen receptor negative status [ER-]: Secondary | ICD-10-CM | POA: Insufficient documentation

## 2021-10-26 DIAGNOSIS — C50512 Malignant neoplasm of lower-outer quadrant of left female breast: Secondary | ICD-10-CM | POA: Diagnosis not present

## 2021-10-26 MED ORDER — GADOBUTROL 1 MMOL/ML IV SOLN
6.0000 mL | Freq: Once | INTRAVENOUS | Status: AC | PRN
  Administered 2021-10-26: 6 mL via INTRAVENOUS

## 2021-10-27 ENCOUNTER — Telehealth: Payer: Self-pay | Admitting: Adult Health

## 2021-10-27 ENCOUNTER — Other Ambulatory Visit: Payer: Self-pay

## 2021-10-27 ENCOUNTER — Inpatient Hospital Stay: Payer: Medicaid Other

## 2021-10-27 ENCOUNTER — Other Ambulatory Visit (HOSPITAL_COMMUNITY): Payer: Self-pay

## 2021-10-27 VITALS — BP 123/80 | HR 95 | Temp 98.7°F | Resp 18

## 2021-10-27 DIAGNOSIS — Z5112 Encounter for antineoplastic immunotherapy: Secondary | ICD-10-CM | POA: Diagnosis not present

## 2021-10-27 DIAGNOSIS — Z95828 Presence of other vascular implants and grafts: Secondary | ICD-10-CM

## 2021-10-27 MED ORDER — FAMOTIDINE IN NACL 20-0.9 MG/50ML-% IV SOLN
20.0000 mg | Freq: Once | INTRAVENOUS | Status: AC
  Administered 2021-10-27: 20 mg via INTRAVENOUS
  Filled 2021-10-27: qty 50

## 2021-10-27 MED ORDER — SODIUM CHLORIDE 0.9% FLUSH
10.0000 mL | Freq: Once | INTRAVENOUS | Status: AC | PRN
  Administered 2021-10-27: 10 mL

## 2021-10-27 MED ORDER — HEPARIN SOD (PORK) LOCK FLUSH 100 UNIT/ML IV SOLN
500.0000 [IU] | Freq: Once | INTRAVENOUS | Status: AC | PRN
  Administered 2021-10-27: 500 [IU]

## 2021-10-27 MED ORDER — SODIUM CHLORIDE 0.9 % IV SOLN: Freq: Once | INTRAVENOUS | Status: AC

## 2021-10-27 NOTE — Progress Notes (Signed)
Erin [name]:    Erin Pounds, NP [place] [address]   DIAGNOSIS:  Cancer Staging  Fallopian tube cancer, carcinoma, right [facility]) Staging form: Ovary, Fallopian Tube, and Primary Peritoneal Carcinoma, AJCC 8th Edition - Clinical stage from 11/04/2017: FIGO Stage I, calculated as Stage Unknown (cT1c2, cNX, cM0) - Signed by Erin Amber, MD on 11/22/2017 Histopathologic type: Serous cystadenocarcinoma, NOS Histologic grade (G): G3 Histologic grading system: 4 grade system Laterality: Right Tumor size (mm): 6 Lymph-vascular invasion (LVI): Presence of LVI unknown/indeterminate Diagnostic confirmation: Positive histology Specimen type: Excision Staged by: Pathologist Stage used in treatment planning: Yes National guidelines used in treatment planning: Yes Type of national guideline used in treatment planning: NCCN  Malignant neoplasm of lower-outer quadrant of left breast of female, estrogen receptor negative ([place]) Staging form: Breast, AJCC 8th Edition - Clinical stage from 05/18/2017: Stage IA (cT1b, cN0, cM0, G3, ER-, PR-, HER2+) - Unsigned Method of lymph node assessment: Clinical Histologic grading system: 3 grade system Laterality: Left Tumor size (mm): 10 - Pathologic: Stage IA (pT1c, pN0, cM0, G3, ER-, PR-, HER2+) - Signed by Erin Phlegm, NP on 07/07/2017 Histologic grading system: 3 grade system   SUMMARY OF ONCOLOGIC HISTORY: Oncology History  Malignant neoplasm of lower-outer quadrant of left breast of female, estrogen receptor negative ([place])  05/11/2017 Initial Diagnosis   Left breast biopsy 3:30 position 6 cm from nipple: IDC with DCIS, lymphovascular invasion present, grade 2-3, ER 0%, PR 0%, HER-2 positive ratio 2.28, Ki-67 20%, 1 cm lesion left breast, T1b N0 stage IA clinical stage   06/09/2017 Surgery   Left lumpectomy: IDC grade 3, 1.2 cm, DCIS is present, margins negative, 0/6 lymph nodes negative,  ER 0%, PR 0%, HER-2 positive ratio 2.28, Ki-67 20%, T1 BN 0 stage IA   07/09/2017 - 09/29/2017 Adjuvant Chemotherapy   Taxol/Herceptin x 12 completed on 09/29/2017, went on to receive every 3 week adjuvant Herceptin through 06/28/2018   11/25/2017 - 01/07/2018 Radiation Therapy    The patient initially received a dose of 50.4 Gy in 28 fractions to the breast using whole-breast tangent fields. This was delivered using a 3-D conformal technique. The patient then received a boost to the seroma. This delivered an additional 10 Gy in 5 fractions using a 3-D technique. The total dose was 60.4 Gy.   02/15/2018 Genetic Testing   The Common Hereditary Cancer Panel offered by [facility] includes sequencing and/or deletion duplication testing of the following 47 genes: APC, ATM, AXIN2, BARD1, BMPR1A, BRCA1, BRCA2, BRIP1, CDH1, CDKN2A (p14ARF), CDKN2A (p16INK4a), CKD4, CHEK2, CTNNA1, DICER1, EPCAM (Deletion/duplication testing only), GREM1 (promoter region deletion/duplication testing only), KIT, MEN1, MLH1, MSH2, MSH3, MSH6, MUTYH, NBN, NF1, NHTL1, PALB2, PDGFRA, PMS2, POLD1, POLE, PTEN, RAD50, RAD51C, RAD51D, SDHB, SDHC, SDHD, SMAD4, SMARCA4. STK11, TP53, TSC1, TSC2, and VHL.  The following genes were evaluated for sequence changes only: SDHA and HOXB13 c.251G>A variant only.   Results: POSITIVE. 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 The date of this test report is 02/15/2018   10/20/2019 PET scan   Bone mets involving Right hip, Bil Iliac bones, T8, T9, L2 and L3, Rt prox humerus, Righ glenoid. Some lytic and some sclerotic. Mild Left supraclav adenopathy, 2 small lesions basal ganglia (could be lacunar infarcts)   11/14/2019 - 12/26/2019 Chemotherapy   The patient had palonosetron (ALOXI) injection 0.25 mg, 0.25 mg, Intravenous,  Once, 2 of 5 cycles Administration: 0.25 mg (12/05/2019), 0.25 mg (12/26/2019) ado-trastuzumab  emtansine (KADCYLA) 260 mg in sodium chloride 0.9 % 250 mL chemo  infusion, 3.6 mg/kg = 260 mg, Intravenous, Once, 3 of 6 cycles Administration: 260 mg (11/14/2019), 260 mg (12/05/2019), 260 mg (12/26/2019)   for chemotherapy treatment.     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  Required TPN briefly.  Patient was able to swallow and keep food down by the time of discharge.   02/06/2020 Miscellaneous   Talazoparib   07/15/2021 - 07/15/2021 Chemotherapy   Patient is on Treatment Plan : Intrathecal Methotrexate     08/14/2021 -  Chemotherapy   Patient is on Treatment Plan : BREAST Capecitabine + Trastuzumab + Tucatinib q21d     Fallopian tube cancer, carcinoma, right ([place])  11/04/2017 Surgery   Uterus, cervix and bilateral fallopian tubes: Fallopian tube with serous carcinoma 0.6 cm positive for p53, PAX8, WT-1, MOC-31, cytokeratin 7, and estrogen receptor   01/11/2018 -  Chemotherapy   Taxol and carboplatin every 3 weeks (Taxol discontinued with cycle 2 because of profound rash)   10/20/2019 PET scan   Bone mets involving Right hip, Bil Iliac bones, T8, T9, L2 and L3, Rt prox humerus, Righ glenoid. Some lytic and some sclerotic. Mild Left supraclav adenopathy, 2 small lesions basal ganglia (could be lacunar infarcts)   11/14/2019 - 12/26/2019 Chemotherapy   The patient had palonosetron (ALOXI) injection 0.25 mg, 0.25 mg, Intravenous,  Once, 2 of 5 cycles Administration: 0.25 mg (12/05/2019), 0.25 mg (12/26/2019) ado-trastuzumab emtansine (KADCYLA) 260 mg in sodium chloride 0.9 % 250 mL chemo infusion, 3.6 mg/kg = 260 mg, Intravenous, Once, 3 of 6 cycles Administration: 260 mg (11/14/2019), 260 mg (12/05/2019), 260 mg (12/26/2019)   for chemotherapy treatment.     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 female returns for evaluation and follow-[name] of her persistent nausea and vomiting.  Since she stopped the tucatinib she has improved.  She will continue to stay off tucatinib and receive Herceptin alone.  With her that the scans she underwent with MRI brain and CT chest abdomen pelvis did not show any cancer related cause or concern that could be related to the vomiting.  She is starting to drink more and eat small bites.  She continues to struggle with reflux from time to time as well.   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[place]) 11/03/2019   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[place]) 11/03/2019   Elevated CA-125  10/09/2019   Paresthesia 04/17/2019   BRCA2 gene mutation positive 02/17/2018   Genetic testing 02/17/2018   Family history of breast cancer    Family history of colon cancer    Fallopian tube cancer, carcinoma, right ([place]) 11/22/2017   S/P vaginal hysterectomy 11/04/2017   Ovarian mass, left 08/26/2017   Menorrhagia 08/26/2017   Port-A-Cath in place 07/23/2017   Malignant neoplasm of lower-outer quadrant of left breast of female, estrogen  receptor negative ([place]) 05/13/2017   Abdominal bloating 04/02/2017   Chronic right-sided low back pain without sciatica 07/02/2016   DM type 2 (diabetes mellitus, type 2) ([place]) 11/18/2014   Intermittent constipation 06/06/2010   UNSPECIFIED IRON DEFICIENCY ANEMIA 04/22/2010   DEPRESSION 04/10/2010   Hearing loss 04/10/2010    has No Known Allergies.  MEDICAL HISTORY: Past Medical History:  Diagnosis Date   Arthritis    knees, elbows   Borderline glaucoma of right eye    BRCA2 gene mutation positive 02/17/2018   BRCA2 c.4552del (p.Glu1518Asnfs*25)  Result reported out on 02/15/2018.    Breast cancer ([place]) 05/2017   left   Chronic back pain    Deaf    per pt born hearing and at age 8 1/2 lost hearing , was told by mother unknown cause, can miminally hear in left and no hearing on right    Depression    Elevated cancer antigen 125 (CA 125)    Family history of breast cancer    Family history of breast cancer    Family history of colon cancer    Genetic testing 02/17/2018   The Common Hereditary Cancer Panel offered by [facility] includes sequencing and/or deletion duplication testing of the following 47 genes: APC, ATM, AXIN2, BARD1, BMPR1A, BRCA1, BRCA2, BRIP1, CDH1, CDKN2A (p14ARF), CDKN2A (p16INK4a), CKD4, CHEK2, CTNNA1, DICER1, EPCAM (Deletion/duplication testing only), GREM1 (promoter region deletion/duplication testing only), KIT, MEN1, MLH1, MSH2, MSH3, MSH6, MU   GERD (gastroesophageal reflux disease)    History of cancer chemotherapy    left breast cancer 09-08-2017 to 10-09-2017;  fallopion tube cancer  12-15-2017  to 06-28-2018   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 oncologist-  dr [name]   11-04-2017  s/p  LAVH w/ BSO,  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;  completed chemo 06-28-2018   History of external beam radiation therapy    left breast  11-25-2017  to 01-07-2018   Hyperlipidemia    Hypertension    followed by pcp    (10-05-2019  per pt never had stress test)   IDA (iron deficiency anemia)    Malignant neoplasm of lower-outer quadrant of left breast of female, estrogen receptor negative [facility]) oncologist-- dr [name]   dx 08/ 2018--- Stage IA, DCIS,  ER/ PR negative,  HER-2 positive;  06-09-2017 s/p left breast lumpectomy with node dissection;  completed chemo 10-09-2017  and radiation 01-07-2018/  hercepton completed 06-28-2018   Non-insulin dependent type 2 diabetes mellitus ([place])    followed by pcp   (10-05-2019 per pt check cbg every other day in AM,  fasting cbg-- 105)   Numbness of right thumb    Wears glasses     SURGICAL HISTORY: Past Surgical History:  Procedure Laterality Date   BREAST LUMPECTOMY WITH RADIOACTIVE SEED AND SENTINEL LYMPH NODE BIOPSY Left 06/09/2017   Procedure: BREAST LUMPECTOMY WITH RADIOACTIVE SEED AND SENTINEL LYMPH NODE BIOPSY;  Surgeon: Excell Seltzer, MD;  Location: [facility];  Service: General;  Laterality: Left;   COLONOSCOPY  11/02/2017   polyps   HERNIA REPAIR     HIATAL HERNIA REPAIR  09/2019   IR RADIOLOGIST EVAL & MGMT  07/09/2021   IR RADIOLOGIST EVAL & MGMT  07/24/2021   IR RADIOLOGIST EVAL & MGMT  09/30/2021   LAPAROSCOPIC ASSISTED VAGINAL HYSTERECTOMY N/A 11/04/2017   Procedure: LAPAROSCOPIC ASSISTED VAGINAL HYSTERECTOMY;  Surgeon: [name], MD;  Location: [facility];  Service: Gynecology;  Laterality: N/A;   LAPAROSCOPIC BILATERAL SALPINGO OOPHERECTOMY Bilateral 11/04/2017   Procedure: LAPAROSCOPIC BILATERAL SALPINGO OOPHORECTOMY;  Surgeon: [name], MD;  Location: [facility];  Service: Gynecology;  Laterality: Bilateral;   LAPAROSCOPY N/A 10/09/2019   Procedure: LAPAROSCOPY DIAGNOSTIC WITH PERITONEAL BIOPSIES;  Surgeon: Erin Amber, MD;  Location: [facility];  Service: Gynecology;  Laterality: N/A;   PORTACATH PLACEMENT Right 06/09/2017   Procedure: INSERTION PORT-A-CATH WITH [place];  Surgeon: Excell Seltzer, MD;   Location: [facility];  Service: General;  Laterality: Right;   PORTACATH PLACEMENT Right 11/13/2019   Procedure: INSERTION PORT-A-CATH WITH ULTRASOUND GUIDANCE;  Surgeon: Rolm Bookbinder, MD;  Location: [facility];  Service: General;  Laterality: Right;   RADIOLOGY WITH ANESTHESIA N/A 08/27/2021   Procedure: IR WITH ANESTHESIA MICROWAVE ABLATION OF LIVER;  Surgeon: Aletta Edouard, MD;  Location: [facility];  Service: Radiology;  Laterality: N/A;   TUBAL LIGATION  02/02/2002   '@[facility]'$    PPTL   UPPER GI ENDOSCOPY      SOCIAL HISTORY: Social History   Socioeconomic History   Marital status: Legally Separated    Spouse name: Not on file   Number of children: 2   Years of education: Not on file   Highest education level: Not on file  Occupational History   Not on file  Tobacco Use   Smoking status: Never   Smokeless tobacco: Never  Vaping Use   Vaping Use: Never used  Substance and Sexual Activity   Alcohol use: Not Currently    Comment: very rare   Drug use: Never   Sexual activity: Not Currently    Birth control/protection: Surgical  Other Topics Concern   Not on file  Social History Narrative   1 boy and 1 girl   Resides in [place]   Seperated   Social Determinants of Health   Financial Resource Strain: Not on file  Food Insecurity: Not on file  Transportation Needs: Not on file  Physical Activity: Not on file  Stress: Not on file  Social Connections: Not on file  Intimate Partner Violence: Not on file    FAMILY HISTORY: Family History  Problem Relation Age of Onset   Hypertension Mother    Lupus Mother    Diabetes Father    Hypertension Sister    Diabetes Paternal Grandmother    Colon cancer Paternal Grandmother 57   CAD Brother    Diabetes Brother    Heart attack Brother    Breast cancer Maternal Aunt        dx >50   Heart attack Paternal Grandfather    Breast cancer Maternal Aunt        dx under 13   Breast cancer Maternal  Aunt        dx  under 50    Review of Systems  Constitutional:  Positive for fatigue (mild). Negative for appetite change, chills, fever and unexpected weight change.  HENT:   Negative for hearing loss, lump/mass and trouble swallowing.   Eyes:  Negative for eye problems and icterus.  Respiratory:  Negative for chest tightness, cough and shortness of breath.   Cardiovascular:  Negative for chest pain, leg swelling and palpitations.  Gastrointestinal:  Negative for abdominal distention, abdominal pain, constipation, diarrhea, nausea and vomiting.  Endocrine: Negative for hot flashes.  Genitourinary:  Negative for difficulty urinating.   Musculoskeletal:  Negative for arthralgias.  Skin:  Negative for itching and rash.  Neurological:  Negative for dizziness, extremity weakness, headaches and numbness.  Hematological:  Negative for adenopathy. Does not bruise/bleed easily.  Psychiatric/Behavioral:  Negative for depression. The patient is not nervous/anxious.      PHYSICAL EXAMINATION  ECOG PERFORMANCE STATUS: 2 - Symptomatic, <50% confined to bed  Vitals:   10/28/21 0929  BP: 132/63  Pulse: 87  Resp: 16  Temp: 97.7 F (36.5 C)  SpO2: 100%    Physical Exam Constitutional:      General: She is not in acute distress.    Appearance: Normal appearance. She is not toxic-appearing.  HENT:     Head: Normocephalic and atraumatic.  Eyes:     General: No scleral icterus. Cardiovascular:     Rate and Rhythm: Normal rate and regular rhythm.     Pulses: Normal pulses.     Heart sounds: Normal heart sounds.  Pulmonary:     Effort: Pulmonary effort is normal.     Breath sounds: Normal breath sounds.  Abdominal:     General: Abdomen is flat. Bowel sounds are normal. There is no distension.     Palpations: Abdomen is soft.     Tenderness: There is no abdominal tenderness.  Musculoskeletal:        General: No swelling.     Cervical back: Neck supple.  Lymphadenopathy:     Cervical:  No cervical adenopathy.  Skin:    General: Skin is warm and dry.     Findings: No rash.  Neurological:     General: No focal deficit present.     Mental Status: She is alert.  Psychiatric:        Mood and Affect: Mood normal.        Behavior: Behavior normal.    LABORATORY DATA:  CBC    Component Value Date/Time   WBC 6.2 10/28/2021 0847   WBC 6.6 09/16/2021 1742   RBC 2.98 (L) 10/28/2021 0847   HGB 9.9 (L) 10/28/2021 0847   HGB 10.9 (L) 04/04/2021 0920   HGB 9.3 (L) 09/15/2017 0800   HCT 30.0 (L) 10/28/2021 0847   HCT 31.5 (L) 04/04/2021 0920   HCT 28.2 (L) 09/15/2017 0800   PLT 150 10/28/2021 0847   PLT 65 (LL) 04/04/2021 0920   MCV 100.7 (H) 10/28/2021 0847   MCV 102 (H) 04/04/2021 0920   MCV 82.4 09/15/2017 0800   MCH 33.2 10/28/2021 0847   MCHC 33.0 10/28/2021 0847   RDW 16.1 (H) 10/28/2021 0847   RDW 14.6 04/04/2021 0920   RDW 20.8 (H) 09/15/2017 0800   LYMPHSABS 1.1 10/28/2021 0847   LYMPHSABS 1.2 04/04/2021 0920   LYMPHSABS 2.5 09/15/2017 0800   MONOABS 0.5 10/28/2021 0847   MONOABS 0.8 09/15/2017 0800   EOSABS 0.0 10/28/2021 0847   EOSABS 0.0 04/04/2021 0920   BASOSABS 0.0 10/28/2021 0847   BASOSABS 0.0 04/04/2021 0920   BASOSABS 0.0 09/15/2017 0800    CMP     Component Value Date/Time   NA 140 10/28/2021 0847   NA 141 04/04/2021 0920   NA 139 09/15/2017 0800  K 3.7 10/28/2021 0847   K 3.8 09/15/2017 0800   CL 106 10/28/2021 0847   CO2 27 10/28/2021 0847   CO2 23 09/15/2017 0800   GLUCOSE 113 (H) 10/28/2021 0847   GLUCOSE 113 09/15/2017 0800   BUN 8 10/28/2021 0847   BUN 11 04/04/2021 0920   BUN 8.7 09/15/2017 0800   CREATININE 0.55 10/28/2021 0847   CREATININE 0.7 09/15/2017 0800   CALCIUM 9.0 10/28/2021 0847   CALCIUM 9.2 09/15/2017 0800   PROT 7.2 10/28/2021 0847   PROT 7.2 04/04/2021 0920   PROT 7.3 09/15/2017 0800   ALBUMIN 3.8 10/28/2021 0847   ALBUMIN 4.2 04/04/2021 0920   ALBUMIN 3.3 (L) 09/15/2017 0800   AST 25 10/28/2021  0847   AST 13 09/15/2017 0800   ALT 37 10/28/2021 0847   ALT 19 09/15/2017 0800   ALKPHOS 115 10/28/2021 0847   ALKPHOS 74 09/15/2017 0800   BILITOT 0.4 10/28/2021 0847   BILITOT 0.27 09/15/2017 0800   GFRNONAA >60 10/28/2021 0847   GFRAA >60 06/14/2020 0821    RADIOGRAPHIC STUDIES:  MR Brain W Wo Contrast  Result Date: 10/26/2021 CLINICAL DATA:  History of breast cancer with leptomeningeal metastatic disease. Vomiting. EXAM: MRI HEAD WITHOUT AND WITH CONTRAST TECHNIQUE: Multiplanar, multiecho pulse sequences of the brain and surrounding structures were obtained without and with intravenous contrast. CONTRAST:  93mL GADAVIST GADOBUTROL 1 MMOL/ML IV SOLN COMPARISON:  Head MRI 07/10/2021 FINDINGS: Brain: A small amount of ill-defined and partially linear enhancement remains along the cerebellar folia, however this has greatly improved from the prior study. There is persistent patchy T2 hyperintensity in the cerebellum bilaterally which may reflect gliosis and/or mild edema. There is new dilatation of the fourth ventricle, however this is favored to be ex vacuo due to cerebellar volume loss given lack of posterior fossa mass effect in no lateral lower third ventricular dilatation to indicate hydrocephalus. There is no evidence of supratentorial metastatic disease. No acute infarct, intracranial hemorrhage, midline shift, or extra-axial fluid collection is identified. 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 Vascular: Major intracranial vascular flow voids are preserved. Skull and upper cervical spine: No suspicious marrow lesion. Sinuses/Orbits: Unremarkable orbits. Mild mucosal thickening in the right maxillary sinus. Trace left mastoid effusion. Other: None. IMPRESSION: 1. Substantial improvement of cerebellar leptomeningeal metastatic disease. 2. No evidence of new intracranial metastatic disease or other acute intracranial abnormality. Electronically Signed   By: Logan Bores M.D.    On: 10/26/2021 13:23      ASSESSMENT and THERAPY PLAN:   Malignant neoplasm of lower-outer quadrant of left breast of female, estrogen receptor negative ([facility]) Left lumpectomy: IDC grade 3, 1.2 cm, DCIS is present, margins negative, 0/6 lymph nodes negative, ER 0%, PR 0%, HER-2 positive ratio 2.28, Ki-67 20%, T1 BN 0 stage IA S/P Taxol-Herceptin BRCA 2 Mutation Positive   10/20/19: Bone mets involving Right hip, Bil Iliac bones, T8, T9, L2 and L3, Rt prox humerus, Righ glenoid. Some lytic and some sclerotic. Mild Left supraclav adenopathy, 2 small lesions basal ganglia (could be lacunar infarcts)   Biopsy the supra clav LN: 10/31/2019: Metastatic breast cancer, ER/PR negative HER-2 equivocal by IHC, FISH positive Treatment summary: Kadcyla every 3-week treatment.  Discontinued for progression   MRI of the back 01/07/2020: Extensive metastatic disease to the involved spine.  Tumor replacement of L3 vertebral body with pathologic compression fracture, epidural and extraosseous extension.  Tumor extending throughout L3 foramen Status post palliative radiation therapy Causing  severe dysphagia and dehydration and hospitalization 01/29/2020-02/03/2020 Talazoparib started 02/07/2020  CT CAP 09/20/19: No evidence of ST mets, tiny inferior right hepatic lesion substantially decreased (3 mm), bone mets have become sclerotic suggest healing CT CAP 12/26/2020: Stable diffuse bone mets CT CAP 07/01/2021: Stable bone metastases.  New liver lesion 1.5 cm Brain MRI 07/12/2021: Abnormal cerebellar signal suspicious for leptomeningeal spread. Lumbar puncture 07/15/2021: Highly suspicious for leptomeningeal carcinomatosis  08/12/2021 through 08/25/2021 whole brain radiation therapy was completed Tucatinib plus Xeloda plus Herceptin based on HER-2 Climb study beginning 12 1, 2022. Liver ablation was completed on August 27, 2021 ------------------------------------------------------------------------------------------------------------------------------------------------------ Current treatment: Tucatinib, capecitabine, trastuzumab (Tucatinib held for nausea and diarrhea), Xeloda discontinued   Toxicities: 1. Profound Nausea: With severe acid reflux.  Resolved since discontinuing tucatinib.  She will receive IV fluids today, she feels like she will not need them after today.  I will also check an H. pylori lab. 2.  Recent scans: I reviewed with her these results and that her cancer actually appears to be improved.  She will follow-[name] with Dr. [name] next week to discuss her treatment and any modifications due to the above.      All questions were answered. The patient knows to call the clinic with any problems, questions or concerns. We can certainly see the patient much sooner if necessary.  Total encounter time: 20 minutes in face-to-face visit time, chart review, lab review, care coordination, discussion with other providers, and documentation of the encounter.   Wilber Bihari, NP 10/28/21 11:42 AM Medical Oncology and Hematology [facility] [place], [place] [address] Tel. [phone]    Fax. [phone]  *Total Encounter Time as defined by the Centers for Medicare and Medicaid Services includes, in addition to the face-to-face time of a patient visit (documented in the note above) non-face-to-face time: obtaining and reviewing outside history, ordering and reviewing medications, tests or procedures, care coordination (communications with other health care professionals or caregivers) and documentation in the medical record.

## 2021-10-27 NOTE — Patient Instructions (Signed)

## 2021-10-27 NOTE — Assessment & Plan Note (Addendum)
Left lumpectomy: IDC grade 3, 1.2 cm, DCIS is present, margins negative, 0/6 lymph nodes negative, ER 0%, PR 0%, HER-2 positive ratio 2.28, Ki-67 20%, T1 BN 0 stage IA S/P Taxol-Herceptin BRCA 2 Mutation Positive  10/20/19:Bone mets involving Right hip, Bil Iliac bones, T8, T9, L2 and L3, Rt prox humerus, Righ glenoid. Some lytic and some sclerotic. Mild Left supraclav adenopathy, 2 small lesions basal ganglia (could be lacunar infarcts)  Biopsy the supra clav LN: 10/31/2019: Metastatic breast cancer, ER/PR negative HER-2 equivocal by IHC, FISHpositive Treatmentsummary: Kadcyla every 3-week treatment.Discontinued for progression  MRI of the back 01/07/2020: Extensive metastatic disease to the involved spine. Tumor replacement of L3 vertebral body with pathologic compression fracture, epidural and extraosseous extension. Tumor extending throughout L3 foramen Status post palliative radiation therapyCausing severe dysphagia and dehydration and hospitalization5/17/2021-02/03/2020 Talazoparib started 02/07/2020 CT CAP 09/20/19: No evidence of ST mets, tiny inferior right hepatic lesion substantially decreased(3 mm), bone mets have become sclerotic suggest healing CT CAP 12/26/2020:Stable diffuse bone mets CT CAP 07/01/2021: Stable bone metastases. New liver lesion 1.5 cm Brain MRI 07/12/2021: Abnormal cerebellar signal suspicious for leptomeningeal spread. Lumbar puncture 07/15/2021: Highly suspicious for leptomeningeal carcinomatosis 08/12/2021 through 08/25/2021 whole brain radiation therapy was completed Tucatinib plus Xeloda plus Herceptin based on HER-2 Climb study beginning 12 1, 2022. Liver ablation was completed on August 27, 2021 ------------------------------------------------------------------------------------------------------------------------------------------------------ Current treatment:Tucatinib, capecitabine, trastuzumab(Tucatinib held for nausea and diarrhea), Xeloda  discontinued  Toxicities: 1. Profound Nausea: With severe acid reflux.  Resolved since discontinuing tucatinib.  She will receive IV fluids today, she feels like she will not need them after today.  I will also check an H. pylori lab. 2.  Recent scans: I reviewed with her these results and that her cancer actually appears to be improved.  She will follow-up with Dr. [name] next week to discuss her treatment and any modifications due to the above.

## 2021-10-27 NOTE — Telephone Encounter (Signed)
Scheduled appointment per 2/10 los. Talked with patient's mother about the changes made to patients appointment. Mother is aware of today's (10/27/21) appointment at 2:30 as well.

## 2021-10-28 ENCOUNTER — Inpatient Hospital Stay: Payer: Medicaid Other

## 2021-10-28 ENCOUNTER — Inpatient Hospital Stay (HOSPITAL_BASED_OUTPATIENT_CLINIC_OR_DEPARTMENT_OTHER): Payer: Medicaid Other | Admitting: Adult Health

## 2021-10-28 ENCOUNTER — Encounter: Payer: Self-pay | Admitting: Adult Health

## 2021-10-28 ENCOUNTER — Inpatient Hospital Stay (HOSPITAL_BASED_OUTPATIENT_CLINIC_OR_DEPARTMENT_OTHER): Payer: Medicaid Other

## 2021-10-28 VITALS — BP 123/68 | HR 72 | Temp 98.4°F | Resp 18

## 2021-10-28 VITALS — BP 132/63 | HR 87 | Temp 97.7°F | Resp 16 | Ht 61.0 in | Wt 124.9 lb

## 2021-10-28 DIAGNOSIS — Z171 Estrogen receptor negative status [ER-]: Secondary | ICD-10-CM

## 2021-10-28 DIAGNOSIS — K219 Gastro-esophageal reflux disease without esophagitis: Secondary | ICD-10-CM

## 2021-10-28 DIAGNOSIS — C7949 Secondary malignant neoplasm of other parts of nervous system: Secondary | ICD-10-CM

## 2021-10-28 DIAGNOSIS — Z7189 Other specified counseling: Secondary | ICD-10-CM

## 2021-10-28 DIAGNOSIS — Z5112 Encounter for antineoplastic immunotherapy: Secondary | ICD-10-CM | POA: Diagnosis not present

## 2021-10-28 DIAGNOSIS — C50512 Malignant neoplasm of lower-outer quadrant of left female breast: Secondary | ICD-10-CM | POA: Diagnosis not present

## 2021-10-28 DIAGNOSIS — Z95828 Presence of other vascular implants and grafts: Secondary | ICD-10-CM

## 2021-10-28 LAB — CMP (CANCER CENTER ONLY)
ALT: 37 U/L (ref 0–44)
AST: 25 U/L (ref 15–41)
Albumin: 3.8 g/dL (ref 3.5–5.0)
Alkaline Phosphatase: 115 U/L (ref 38–126)
Anion gap: 7 (ref 5–15)
BUN: 8 mg/dL (ref 6–20)
CO2: 27 mmol/L (ref 22–32)
Calcium: 9 mg/dL (ref 8.9–10.3)
Chloride: 106 mmol/L (ref 98–111)
Creatinine: 0.55 mg/dL (ref 0.44–1.00)
GFR, Estimated: >60 mL/min (ref >60)
Glucose, Bld: 113 mg/dL — ABNORMAL HIGH (ref 70–99)
Potassium: 3.7 mmol/L (ref 3.5–5.1)
Sodium: 140 mmol/L (ref 135–145)
Total Bilirubin: 0.4 mg/dL (ref 0.3–1.2)
Total Protein: 7.2 g/dL (ref 6.5–8.1)

## 2021-10-28 LAB — CBC WITH DIFFERENTIAL (CANCER CENTER ONLY)
Abs Immature Granulocytes: 0.02 10*3/uL (ref 0.00–0.07)
Basophils Absolute: 0 10*3/uL (ref 0.0–0.1)
Basophils Relative: 0 %
Eosinophils Absolute: 0 10*3/uL (ref 0.0–0.5)
Eosinophils Relative: 0 %
HCT: 30 % — ABNORMAL LOW (ref 36.0–46.0)
Hemoglobin: 9.9 g/dL — ABNORMAL LOW (ref 12.0–15.0)
Immature Granulocytes: 0 %
Lymphocytes Relative: 18 %
Lymphs Abs: 1.1 10*3/uL (ref 0.7–4.0)
MCH: 33.2 pg (ref 26.0–34.0)
MCHC: 33 g/dL (ref 30.0–36.0)
MCV: 100.7 fL — ABNORMAL HIGH (ref 80.0–100.0)
Monocytes Absolute: 0.5 10*3/uL (ref 0.1–1.0)
Monocytes Relative: 8 %
Neutro Abs: 4.6 10*3/uL (ref 1.7–7.7)
Neutrophils Relative %: 74 %
Platelet Count: 150 10*3/uL (ref 150–400)
RBC: 2.98 MIL/uL — ABNORMAL LOW (ref 3.87–5.11)
RDW: 16.1 % — ABNORMAL HIGH (ref 11.5–15.5)
WBC Count: 6.2 10*3/uL (ref 4.0–10.5)
nRBC: 0 % (ref 0.0–0.2)

## 2021-10-28 MED ORDER — HEPARIN SOD (PORK) LOCK FLUSH 100 UNIT/ML IV SOLN
500.0000 [IU] | Freq: Once | INTRAVENOUS | Status: AC | PRN
  Administered 2021-10-28: 500 [IU]

## 2021-10-28 MED ORDER — ALTEPLASE 2 MG IJ SOLR: 2.0000 mg | Freq: Once | INTRAMUSCULAR | Status: DC | PRN

## 2021-10-28 MED ORDER — SODIUM CHLORIDE 0.9% FLUSH
10.0000 mL | Freq: Once | INTRAVENOUS | Status: AC | PRN
  Administered 2021-10-28: 10 mL

## 2021-10-28 MED ORDER — SODIUM CHLORIDE 0.9 % IV SOLN: Freq: Once | INTRAVENOUS | Status: AC

## 2021-10-28 NOTE — Patient Instructions (Signed)

## 2021-10-29 ENCOUNTER — Other Ambulatory Visit: Payer: Self-pay | Admitting: Radiation Therapy

## 2021-10-29 DIAGNOSIS — C7931 Secondary malignant neoplasm of brain: Secondary | ICD-10-CM

## 2021-10-30 ENCOUNTER — Other Ambulatory Visit: Payer: Self-pay | Admitting: Radiation Therapy

## 2021-11-05 ENCOUNTER — Encounter: Payer: Self-pay | Admitting: Adult Health

## 2021-11-05 ENCOUNTER — Inpatient Hospital Stay (HOSPITAL_BASED_OUTPATIENT_CLINIC_OR_DEPARTMENT_OTHER): Payer: Medicaid Other | Admitting: Adult Health

## 2021-11-05 ENCOUNTER — Inpatient Hospital Stay: Payer: Medicaid Other

## 2021-11-05 ENCOUNTER — Other Ambulatory Visit: Payer: Self-pay | Admitting: *Deleted

## 2021-11-05 ENCOUNTER — Encounter: Payer: Self-pay | Admitting: Dietician

## 2021-11-05 ENCOUNTER — Ambulatory Visit: Payer: Medicaid Other | Admitting: Pharmacist

## 2021-11-05 ENCOUNTER — Other Ambulatory Visit: Payer: Self-pay

## 2021-11-05 ENCOUNTER — Other Ambulatory Visit (HOSPITAL_COMMUNITY): Payer: Self-pay

## 2021-11-05 ENCOUNTER — Encounter: Payer: Self-pay | Admitting: Hematology and Oncology

## 2021-11-05 VITALS — BP 116/67 | HR 102 | Temp 97.7°F | Resp 16 | Ht 61.0 in | Wt 119.3 lb

## 2021-11-05 VITALS — HR 95

## 2021-11-05 DIAGNOSIS — C7949 Secondary malignant neoplasm of other parts of nervous system: Secondary | ICD-10-CM

## 2021-11-05 DIAGNOSIS — Z5112 Encounter for antineoplastic immunotherapy: Secondary | ICD-10-CM | POA: Diagnosis not present

## 2021-11-05 DIAGNOSIS — Z171 Estrogen receptor negative status [ER-]: Secondary | ICD-10-CM

## 2021-11-05 DIAGNOSIS — C50512 Malignant neoplasm of lower-outer quadrant of left female breast: Secondary | ICD-10-CM

## 2021-11-05 DIAGNOSIS — K219 Gastro-esophageal reflux disease without esophagitis: Secondary | ICD-10-CM

## 2021-11-05 DIAGNOSIS — C770 Secondary and unspecified malignant neoplasm of lymph nodes of head, face and neck: Secondary | ICD-10-CM

## 2021-11-05 DIAGNOSIS — C5701 Malignant neoplasm of right fallopian tube: Secondary | ICD-10-CM

## 2021-11-05 DIAGNOSIS — Z7189 Other specified counseling: Secondary | ICD-10-CM

## 2021-11-05 DIAGNOSIS — C50919 Malignant neoplasm of unspecified site of unspecified female breast: Secondary | ICD-10-CM

## 2021-11-05 DIAGNOSIS — Z95828 Presence of other vascular implants and grafts: Secondary | ICD-10-CM

## 2021-11-05 DIAGNOSIS — C7951 Secondary malignant neoplasm of bone: Secondary | ICD-10-CM

## 2021-11-05 DIAGNOSIS — R110 Nausea: Secondary | ICD-10-CM

## 2021-11-05 LAB — CBC WITH DIFFERENTIAL (CANCER CENTER ONLY)
Abs Immature Granulocytes: 0.03 10*3/uL (ref 0.00–0.07)
Basophils Absolute: 0 10*3/uL (ref 0.0–0.1)
Basophils Relative: 0 %
Eosinophils Absolute: 0 10*3/uL (ref 0.0–0.5)
Eosinophils Relative: 0 %
HCT: 32 % — ABNORMAL LOW (ref 36.0–46.0)
Hemoglobin: 10.4 g/dL — ABNORMAL LOW (ref 12.0–15.0)
Immature Granulocytes: 0 %
Lymphocytes Relative: 9 %
Lymphs Abs: 0.7 10*3/uL (ref 0.7–4.0)
MCH: 32.7 pg (ref 26.0–34.0)
MCHC: 32.5 g/dL (ref 30.0–36.0)
MCV: 100.6 fL — ABNORMAL HIGH (ref 80.0–100.0)
Monocytes Absolute: 0.5 10*3/uL (ref 0.1–1.0)
Monocytes Relative: 6 %
Neutro Abs: 7.1 10*3/uL (ref 1.7–7.7)
Neutrophils Relative %: 85 %
Platelet Count: 168 10*3/uL (ref 150–400)
RBC: 3.18 MIL/uL — ABNORMAL LOW (ref 3.87–5.11)
RDW: 16.3 % — ABNORMAL HIGH (ref 11.5–15.5)
WBC Count: 8.3 10*3/uL (ref 4.0–10.5)
nRBC: 0 % (ref 0.0–0.2)

## 2021-11-05 LAB — CMP (CANCER CENTER ONLY)
ALT: 44 U/L (ref 0–44)
AST: 25 U/L (ref 15–41)
Albumin: 4 g/dL (ref 3.5–5.0)
Alkaline Phosphatase: 116 U/L (ref 38–126)
Anion gap: 7 (ref 5–15)
BUN: 15 mg/dL (ref 6–20)
CO2: 28 mmol/L (ref 22–32)
Calcium: 9.3 mg/dL (ref 8.9–10.3)
Chloride: 102 mmol/L (ref 98–111)
Creatinine: 0.58 mg/dL (ref 0.44–1.00)
GFR, Estimated: >60 mL/min (ref >60)
Glucose, Bld: 162 mg/dL — ABNORMAL HIGH (ref 70–99)
Potassium: 3.9 mmol/L (ref 3.5–5.1)
Sodium: 137 mmol/L (ref 135–145)
Total Bilirubin: 0.5 mg/dL (ref 0.3–1.2)
Total Protein: 7.7 g/dL (ref 6.5–8.1)

## 2021-11-05 MED ORDER — PROMETHAZINE HCL 25 MG RE SUPP
25.0000 mg | Freq: Four times a day (QID) | RECTAL | 0 refills | Status: AC | PRN
  Filled 2021-11-05 – 2021-11-06 (×2): qty 30, 8d supply, fill #0

## 2021-11-05 MED ORDER — SODIUM CHLORIDE 0.9% FLUSH
10.0000 mL | INTRAVENOUS | Status: DC | PRN
  Administered 2021-11-05: 10 mL

## 2021-11-05 MED ORDER — DENOSUMAB 120 MG/1.7ML ~~LOC~~ SOLN: 120.0000 mg | Freq: Once | SUBCUTANEOUS | Status: DC

## 2021-11-05 MED ORDER — TRASTUZUMAB-DKST CHEMO 150 MG IV SOLR
6.0000 mg/kg | Freq: Once | INTRAVENOUS | Status: AC
  Administered 2021-11-05: 336 mg via INTRAVENOUS
  Filled 2021-11-05: qty 16

## 2021-11-05 MED ORDER — PROMETHAZINE HCL 25 MG RE SUPP: 25.0000 mg | Freq: Four times a day (QID) | RECTAL | 0 refills | Status: DC | PRN

## 2021-11-05 MED ORDER — ONDANSETRON 8 MG PO TBDP: 8.0000 mg | ORAL_TABLET | Freq: Three times a day (TID) | ORAL | 0 refills | Status: DC | PRN

## 2021-11-05 MED ORDER — SODIUM CHLORIDE 0.9 % IV SOLN: Freq: Once | INTRAVENOUS | Status: AC

## 2021-11-05 MED ORDER — ACETAMINOPHEN 325 MG PO TABS
650.0000 mg | ORAL_TABLET | Freq: Once | ORAL | Status: AC
  Administered 2021-11-05: 650 mg via ORAL
  Filled 2021-11-05: qty 2

## 2021-11-05 MED ORDER — DIPHENHYDRAMINE HCL 25 MG PO CAPS
50.0000 mg | ORAL_CAPSULE | Freq: Once | ORAL | Status: AC
  Administered 2021-11-05: 50 mg via ORAL
  Filled 2021-11-05: qty 2

## 2021-11-05 MED ORDER — FAMOTIDINE IN NACL 20-0.9 MG/50ML-% IV SOLN
20.0000 mg | Freq: Once | INTRAVENOUS | Status: AC
  Administered 2021-11-05: 20 mg via INTRAVENOUS
  Filled 2021-11-05: qty 50

## 2021-11-05 MED ORDER — HEPARIN SOD (PORK) LOCK FLUSH 100 UNIT/ML IV SOLN
500.0000 [IU] | Freq: Once | INTRAVENOUS | Status: AC | PRN
  Administered 2021-11-05: 500 [IU]

## 2021-11-05 MED ORDER — ONDANSETRON 8 MG PO TBDP
8.0000 mg | ORAL_TABLET | Freq: Three times a day (TID) | ORAL | 0 refills | Status: AC | PRN
  Filled 2021-11-05: qty 20, 7d supply, fill #0

## 2021-11-05 NOTE — Progress Notes (Signed)
Per [name], NP ok to increase IVF today to accommodate interpreter availability since fluids were added onto this appointment. Patient's fluids were increased to 755ml/hr to be completed around 1:30pm.

## 2021-11-05 NOTE — Progress Notes (Signed)
Provided one complimentary case of Ensure Plus High Protein 

## 2021-11-05 NOTE — Progress Notes (Signed)
Patient's mom returned my call to discuss concern with obtaining medication. She states she was told by insurance that patient is not covered and hasn't received any paperwork from DSS. Advised patient to communicate with daughter to determine her method and status of recertification and then call DSS and ask for someone in Medicaid to follow up. She verbalized understanding.  Verbally asked for patient income to screen for grant. Approved patient based on situation to obtain her medication. Patient approved for one-time $1000 [employer] grant to assist with medication and other personal expenses while going through treatment. Advised medication would have to be filled and picked up at [facility]. She verbalized understanding. Advised I would notify RN to have prescriptions sent to [facility]. Staff message sent to notify Merleen Nicely and she responded. Advised patient mom to contact pharmacy before leaving appointment tomorrow to determine if ready for pick up prior to going there and gave their contact phone number. She verbalized understanding.  She has my contact name and number for any additional financial questions or concerns.

## 2021-11-05 NOTE — Progress Notes (Signed)
Received chat from RN regarding medication not being covered by insurance and was advised patient's mom was best contact.  Called patient's mom to discuss further and she informed me she was on phone with insurance and would call me back. Provided my direct contact number [phone].

## 2021-11-05 NOTE — Patient Instructions (Signed)
[facility]]   Discharge Instructions: Thank you for choosing [facility] to provide your oncology and hematology care.   If you have a lab appointment with the Cancer Center, please go directly to the Cancer Center and check in at the registration area.   Wear comfortable clothing and clothing appropriate for easy access to any Portacath or PICC line.   We strive to give you quality time with your provider. You may need to reschedule your appointment if you arrive late (15 or more minutes).  Arriving late affects you and other patients whose appointments are after yours.  Also, if you miss three or more appointments without notifying the office, you may be dismissed from the clinic at the provider's discretion.      For prescription refill requests, have your pharmacy contact our office and allow 72 hours for refills to be completed.    Today you received the following chemotherapy and/or immunotherapy agents: Trastuzumab (Herceptin)       To help prevent nausea and vomiting after your treatment, we encourage you to take your nausea medication as directed.  BELOW ARE SYMPTOMS THAT SHOULD BE REPORTED IMMEDIATELY: *FEVER GREATER THAN 100.4 F (38 C) OR HIGHER *CHILLS OR SWEATING *NAUSEA AND VOMITING THAT IS NOT CONTROLLED WITH YOUR NAUSEA MEDICATION *UNUSUAL SHORTNESS OF BREATH *UNUSUAL BRUISING OR BLEEDING *URINARY PROBLEMS (pain or burning when urinating, or frequent urination) *BOWEL PROBLEMS (unusual diarrhea, constipation, pain near the anus) TENDERNESS IN MOUTH AND THROAT WITH OR WITHOUT PRESENCE OF ULCERS (sore throat, sores in mouth, or a toothache) UNUSUAL RASH, SWELLING OR PAIN  UNUSUAL VAGINAL DISCHARGE OR ITCHING   Items with * indicate a potential emergency and should be followed up as soon as possible or go to the Emergency Department if any problems should occur.  Please show the CHEMOTHERAPY ALERT CARD or IMMUNOTHERAPY ALERT  CARD at check-in to the Emergency Department and triage nurse.  Should you have questions after your visit or need to cancel or reschedule your appointment, please contact [facility]  Dept: [phone]  and follow the prompts.  Office hours are 8:00 a.m. to 4:30 p.m. Monday - Friday. Please note that voicemails left after 4:00 p.m. may not be returned until the following business day.  We are closed weekends and major holidays. You have access to a nurse at all times for urgent questions. Please call the main number to the clinic Dept: [phone] and follow the prompts.   For any non-urgent questions, you may also contact your provider using MyChart. We now offer e-Visits for anyone 18 and older to request care online for non-urgent symptoms. For details visit [email or web address].   Also download the MyChart app! Go to the app store, search "MyChart", open the app, select [facility], and log in with your MyChart username and password.  Due to Covid, a mask is required upon entering the hospital/clinic. If you do not have a mask, one will be given to you upon arrival. For doctor visits, patients may have 1 support person aged 18 or older with them. For treatment visits, patients cannot have anyone with them due to current Covid guidelines and our immunocompromised population.  

## 2021-11-05 NOTE — Progress Notes (Unsigned)
Received call back from patient's mom to discuss concerns.  She states she was told by insurance paperwork had not been received from [place] for Medicaid. Advised her to contact DSS and ask for someone in Medicaid to discuss further and to ask patient about recertification status before she calls.

## 2021-11-05 NOTE — Progress Notes (Signed)
Hold Xgeva today per Wilber Bihari, NP.    Kennith Center, Pharm.D., CPP 11/05/2021@12 :37 PM

## 2021-11-06 ENCOUNTER — Encounter: Payer: Self-pay | Admitting: Hematology and Oncology

## 2021-11-06 ENCOUNTER — Inpatient Hospital Stay: Payer: Medicaid Other

## 2021-11-06 ENCOUNTER — Encounter: Payer: Self-pay | Admitting: Adult Health

## 2021-11-06 ENCOUNTER — Other Ambulatory Visit (HOSPITAL_COMMUNITY): Payer: Self-pay

## 2021-11-06 VITALS — BP 100/61 | HR 9 | Temp 98.7°F | Resp 90

## 2021-11-06 DIAGNOSIS — Z5112 Encounter for antineoplastic immunotherapy: Secondary | ICD-10-CM | POA: Diagnosis not present

## 2021-11-06 DIAGNOSIS — Z95828 Presence of other vascular implants and grafts: Secondary | ICD-10-CM

## 2021-11-06 LAB — HELICOBACTER PYLORI ABS-IGG+IGA, BLD
H Pylori IgG: 0.09 Index Value (ref 0.00–0.79)
H. Pylogi, Iga Abs: 12.3 units — ABNORMAL HIGH (ref 0.0–8.9)

## 2021-11-06 MED ORDER — SODIUM CHLORIDE 0.9 % IV SOLN: Freq: Once | INTRAVENOUS | Status: AC

## 2021-11-06 MED ORDER — HEPARIN SOD (PORK) LOCK FLUSH 100 UNIT/ML IV SOLN
500.0000 [IU] | Freq: Once | INTRAVENOUS | Status: AC | PRN
  Administered 2021-11-06: 500 [IU]

## 2021-11-06 MED ORDER — SODIUM CHLORIDE 0.9% FLUSH
10.0000 mL | Freq: Once | INTRAVENOUS | Status: AC | PRN
  Administered 2021-11-06: 10 mL

## 2021-11-06 MED ORDER — FAMOTIDINE IN NACL 20-0.9 MG/50ML-% IV SOLN
20.0000 mg | Freq: Once | INTRAVENOUS | Status: AC
  Administered 2021-11-06: 20 mg via INTRAVENOUS
  Filled 2021-11-06: qty 50

## 2021-11-06 NOTE — Progress Notes (Signed)
Received call from patient mom regarding follow up on Medicaid status. Patient no linger has [employer] but has Medicaid [place] and she will receive a new card.  She also states they weren't able to find insurance or grant at pharmacy yesterday so she paid but was unable to pay for prescriptions tomorrow. I advised I would follow up.  I called [facility] and spoke with Delilah Shan. She states the grant was just showing in [place] today. I will follow up on this issue. Provided Erin [name] with Medicaid ID# that was used at visit today and she was able to confirm patient is covered and will be no cost for medications tomorrow.   Called patient's mom back to advise of this and she was very appreciative.  She has my card for any additional financial questions or concerns.

## 2021-11-06 NOTE — Patient Instructions (Signed)
Dehydration, Adult Dehydration is condition in which there is not enough water or other fluids in the body. This happens when a person loses more fluids than he or she takes in. Important body parts cannot work right without the right amount of fluids. Any loss of fluids from the body can cause dehydration. Dehydration can be mild, worse, or very bad. It should be treated right away to keep it from getting very bad. What are the causes? This condition may be caused by: Conditions that cause loss of water or other fluids, such as: Watery poop (diarrhea). Vomiting. Sweating a lot. Peeing (urinating) a lot. Not drinking enough fluids, especially when you: Are ill. Are doing things that take a lot of energy to do. Other illnesses and conditions, such as fever or infection. Certain medicines, such as medicines that take extra fluid out of the body (diuretics). Lack of safe drinking water. Not being able to get enough water and food. What increases the risk? The following factors may make you more likely to develop this condition: Having a long-term (chronic) illness that has not been treated the right way, such as: Diabetes. Heart disease. Kidney disease. Being 59 years of age or older. Having a disability. Living in a place that is high above the ground or sea (high in altitude). The thinner, dried air causes more fluid loss. Doing exercises that put stress on your body for a long time. What are the signs or symptoms? Symptoms of dehydration depend on how bad it is. Mild or worse dehydration Thirst. Dry lips or dry mouth. Feeling dizzy or light-headed, especially when you stand up from sitting. Muscle cramps. Your body making: Dark pee (urine). Pee may be the color of tea. Less pee than normal. Less tears than normal. Headache. Very bad dehydration Changes in skin. Skin may: Be cold to the touch (clammy). Be blotchy or pale. Not go back to normal right after you lightly pinch  it and let it go. Little or no tears, pee, or sweat. Changes in vital signs, such as: Fast breathing. Low blood pressure. Weak pulse. Pulse that is more than 100 beats a minute when you are sitting still. Other changes, such as: Feeling very thirsty. Eyes that look hollow (sunken). Cold hands and feet. Being mixed up (confused). Being very tired (lethargic) or having trouble waking from sleep. Short-term weight loss. Loss of consciousness. How is this treated? Treatment for this condition depends on how bad it is. Treatment should start right away. Do not wait until your condition gets very bad. Very bad dehydration is an emergency. You will need to go to a hospital. Mild or worse dehydration can be treated at home. You may be asked to: Drink more fluids. Drink an oral rehydration solution (ORS). This drink helps get the right amounts of fluids and salts and minerals in the blood (electrolytes). Very bad dehydration can be treated: With fluids through an IV tube. By getting normal levels of salts and minerals in your blood. This is often done by giving salts and minerals through a tube. The tube is passed through your nose and into your stomach. By treating the root cause. Follow these instructions at home: Oral rehydration solution If told by your doctor, drink an ORS: Make an ORS. Use instructions on the package. Start by drinking small amounts, about  cup (120 mL) every 5-10 minutes. Slowly drink more until you have had the amount that your doctor said to have. Eating and drinking  Drink enough clear fluid to keep your pee pale yellow. If you were told to drink an ORS, finish the ORS first. Then, start slowly drinking other clear fluids. Drink fluids such as: Water. Do not drink only water. Doing that can make the salt (sodium) level in your body get too low. Water from ice chips you suck on. Fruit juice that you have added water to (diluted). Low-calorie sports  drinks. Eat foods that have the right amounts of salts and minerals, such as: Bananas. Oranges. Potatoes. Tomatoes. Spinach. Do not drink alcohol. Avoid: Drinks that have a lot of sugar. These include: High-calorie sports drinks. Fruit juice that you did not add water to. Soda. Caffeine. Foods that are greasy or have a lot of fat or sugar. General instructions Take over-the-counter and prescription medicines only as told by your doctor. Do not take salt tablets. Doing that can make the salt level in your body get too high. Return to your normal activities as told by your doctor. Ask your doctor what activities are safe for you. Keep all follow-up visits as told by your doctor. This is important. Contact a doctor if: You have pain in your belly (abdomen) and the pain: Gets worse. Stays in one place. You have a rash. You have a stiff neck. You get angry or annoyed (irritable) more easily than normal. You are more tired or have a harder time waking than normal. You feel: Weak or dizzy. Very thirsty. Get help right away if you have: Any symptoms of very bad dehydration. Symptoms of vomiting, such as: You cannot eat or drink without vomiting. Your vomiting gets worse or does not go away. Your vomit has blood or green stuff in it. Symptoms that get worse with treatment. A fever. A very bad headache. Problems with peeing or pooping (having a bowel movement), such as: Watery poop that gets worse or does not go away. Blood in your poop (stool). This may cause poop to look black and tarry. Not peeing in 6-8 hours. Peeing only a small amount of very dark pee in 6-8 hours. Trouble breathing. These symptoms may be an emergency. Do not wait to see if the symptoms will go away. Get medical help right away. Call your local emergency services (911 in the [place] Do not drive yourself to the hospital. Summary Dehydration is a condition in which there is not enough water or other fluids  in the body. This happens when a person loses more fluids than he or she takes in. Treatment for this condition depends on how bad it is. Treatment should be started right away. Do not wait until your condition gets very bad. Drink enough clear fluid to keep your pee pale yellow. If you were told to drink an oral rehydration solution (ORS), finish the ORS first. Then, start slowly drinking other clear fluids. Take over-the-counter and prescription medicines only as told by your doctor. Get help right away if you have any symptoms of very bad dehydration. This information is not intended to replace advice given to you by your health care provider. Make sure you discuss any questions you have with your health care provider. Nausea, Adult Nausea is feeling like you may vomit. Feeling like you may vomit is usually not serious, but it may be an early sign of a more serious medical problem. Vomiting is when stomach contents forcefully come out of your mouth. If you vomit, or if you are not able to drink enough fluids, you may not have  enough water in your body (get dehydrated). If you do not have enough water in your body, you may: Feel tired. Feel thirsty. Have a dry mouth. Have cracked lips. Pee (urinate) less often. Older adults and people who have other diseases or a weak body defense system (immune system) have a higher risk of not having enough water in the body. The main goals of treating this condition are: To relieve your nausea. To ensure your nausea occurs less often. To prevent vomiting and losing too much fluid. Follow these instructions at home: Watch your symptoms for any changes. Tell your doctor about them. Eating and drinking   Take an ORS (oral rehydration solution). This is a drink that is sold at pharmacies and stores. Drink clear fluids in small amounts as you are able. These include: Water. Ice chips. Fruit juice that has water added (diluted fruit juice). Low-calorie  sports drinks. Eat bland, easy-to-digest foods in small amounts as you are able, such as: Bananas. Applesauce. Rice. Low-fat (lean) meats. Toast. Crackers. Avoid drinking fluids that have a lot of sugar or caffeine in them. This includes energy drinks, sports drinks, and soda. Avoid alcohol. Avoid spicy or fatty foods. General instructions Take over-the-counter and prescription medicines only as told by your doctor. Rest at home while you get better. Drink enough fluid to keep your pee (urine) pale yellow. Take slow and deep breaths when you feel like you may vomit. Avoid food or things that have strong smells. Wash your hands often with soap and water for at least 20 seconds. If you cannot use soap and water, use hand sanitizer. Make sure that everyone in your home washes their hands well and often. Keep all follow-up visits. Contact a doctor if: You feel worse. You feel like you may vomit and this lasts for more than 2 days. You vomit. You are not able to drink fluids without vomiting. You have new symptoms. You have a fever. You have a headache. You have muscle cramps. You have a rash. You have pain while peeing. You feel light-headed or dizzy. Get help right away if: You have pain in your chest, neck, arm, or jaw. You feel very weak or you faint. You have vomit that is bright red or looks like coffee grounds. You have bloody or black poop (stools) or poop that looks like tar. You have a very bad headache, a stiff neck, or both. You have very bad pain, cramping, or bloating in your belly (abdomen). You have trouble breathing or you are breathing very quickly. Your heart is beating very quickly. Your skin feels cold and clammy. You feel confused. You have signs of losing too much water in your body, such as: Dark pee, very little pee, or no pee. Cracked lips. Dry mouth. Sunken eyes. Sleepiness. Weakness. These symptoms may be an emergency. Get help right away. Call  911. Do not wait to see if the symptoms will go away. Do not drive yourself to the hospital. Summary Nausea is feeling like you are about vomit. If you vomit, or if you are not able to drink enough fluids, you may not have enough water in your body (get dehydrated). Eat and drink what your doctor tells you. Take over-the-counter and prescription medicines only as told by your doctor. Contact a doctor right away if your symptoms get worse or you have new symptoms. Keep all follow-up visits. This information is not intended to replace advice given to you by your health care provider. Make sure  you discuss any questions you have with your health care provider. Document Revised: 03/07/2021 Document Reviewed: 03/07/2021 Elsevier Patient Education  [place] Revised: 04/13/2019 Document Reviewed: 04/13/2019 Elsevier Patient Education  [place].

## 2021-11-06 NOTE — Assessment & Plan Note (Signed)
Left lumpectomy: IDC grade 3, 1.2 cm, DCIS is present, margins negative, 0/6 lymph nodes negative, ER 0%, PR 0%, HER-2 positive ratio 2.28, Ki-67 20%, T1 BN 0 stage IA S/P Taxol-Herceptin BRCA 2 Mutation Positive  10/20/19:Bone mets involving Right hip, Bil Iliac bones, T8, T9, L2 and L3, Rt prox humerus, Righ glenoid. Some lytic and some sclerotic. Mild Left supraclav adenopathy, 2 small lesions basal ganglia (could be lacunar infarcts)  Biopsy the supra clav LN: 10/31/2019: Metastatic breast cancer, ER/PR negative HER-2 equivocal by IHC, FISHpositive Treatmentsummary: Kadcyla every 3-week treatment.Discontinued for progression  MRI of the back 01/07/2020: Extensive metastatic disease to the involved spine. Tumor replacement of L3 vertebral body with pathologic compression fracture, epidural and extraosseous extension. Tumor extending throughout L3 foramen Status post palliative radiation therapyCausing severe dysphagia and dehydration and hospitalization5/17/2021-02/03/2020 Talazoparib started 02/07/2020 CT CAP 09/20/19: No evidence of ST mets, tiny inferior right hepatic lesion substantially decreased(3 mm), bone mets have become sclerotic suggest healing CT CAP 12/26/2020:Stable diffuse bone mets CT CAP 07/01/2021: Stable bone metastases. New liver lesion 1.5 cm Brain MRI 07/12/2021: Abnormal cerebellar signal suspicious for leptomeningeal spread. Lumbar puncture 07/15/2021: Highly suspicious for leptomeningeal carcinomatosis 08/12/2021 through 08/25/2021 whole brain radiation therapy was completed Tucatinib plus Xeloda plus Herceptin based on HER-2 Climb study beginning 12 1, 2022. Liver ablation was completed on August 27, 2021 ------------------------------------------------------------------------------------------------------------------------------------------------------ Current treatment:Tucatinib, capecitabine, trastuzumab(Tucatinib held for nausea and diarrhea), Xeloda  discontinued  Toxicities: 1. Profound Nausea: With severe acid reflux.  This has unfortunately returned despite stopping tucatinib.  She will receive IV fluids today.  I placed a GI referral and we recommended she use the phenergan suppositories and Zofran ODT. HPylori is pending.. 2.  Recent scans: I reviewed with her these results and that her cancer has improved.  Erin [name] will return on 2/27 for labs, f/u.

## 2021-11-06 NOTE — Progress Notes (Addendum)
[facility] Follow up:   Erin Pounds, NP [place] [address]   DIAGNOSIS:  Cancer Staging  Fallopian tube cancer, carcinoma, right [facility]) Staging form: Ovary, Fallopian Tube, and Primary Peritoneal Carcinoma, AJCC 8th Edition - Clinical stage from 11/04/2017: FIGO Stage I, calculated as Stage Unknown (cT1c2, cNX, cM0) - Signed by [name], MD on 11/22/2017 Histopathologic type: Serous cystadenocarcinoma, NOS Histologic grade (G): G3 Histologic grading system: 4 grade system Laterality: Right Tumor size (mm): 6 Lymph-vascular invasion (LVI): Presence of LVI unknown/indeterminate Diagnostic confirmation: Positive histology Specimen type: Excision Staged by: Pathologist Stage used in treatment planning: Yes National guidelines used in treatment planning: Yes Type of national guideline used in treatment planning: NCCN  Malignant neoplasm of lower-outer quadrant of left breast of female, estrogen receptor negative ([place]) Staging form: Breast, AJCC 8th Edition - Clinical stage from 05/18/2017: Stage IA (cT1b, cN0, cM0, G3, ER-, PR-, HER2+) - Unsigned Method of lymph node assessment: Clinical Histologic grading system: 3 grade system Laterality: Left Tumor size (mm): 10 - Pathologic: Stage IA (pT1c, pN0, cM0, G3, ER-, PR-, HER2+) - Signed by [name], NP on 07/07/2017 Histologic grading system: 3 grade system   SUMMARY OF ONCOLOGIC HISTORY: Oncology History  Malignant neoplasm of lower-outer quadrant of left breast of female, estrogen receptor negative ([place])  05/11/2017 Initial Diagnosis   Left breast biopsy 3:30 position 6 cm from nipple: IDC with DCIS, lymphovascular invasion present, grade 2-3, ER 0%, PR 0%, HER-2 positive ratio 2.28, Ki-67 20%, 1 cm lesion left breast, T1b N0 stage IA clinical stage   06/09/2017 Surgery   Left lumpectomy: IDC grade 3, 1.2 cm, DCIS is present, margins negative, 0/6 lymph nodes negative, ER  0%, PR 0%, HER-2 positive ratio 2.28, Ki-67 20%, T1 BN 0 stage IA   07/09/2017 - 09/29/2017 Adjuvant Chemotherapy   Taxol/Herceptin x 12 completed on 09/29/2017, went on to receive every 3 week adjuvant Herceptin through 06/28/2018   11/25/2017 - 01/07/2018 Radiation Therapy    The patient initially received a dose of 50.4 Gy in 28 fractions to the breast using whole-breast tangent fields. This was delivered using a 3-D conformal technique. The patient then received a boost to the seroma. This delivered an additional 10 Gy in 5 fractions using a 3-D technique. The total dose was 60.4 Gy.   02/15/2018 Genetic Testing   The Common Hereditary Cancer Panel offered by [facility] includes sequencing and/or deletion duplication testing of the following 47 genes: APC, ATM, AXIN2, BARD1, BMPR1A, BRCA1, BRCA2, BRIP1, CDH1, CDKN2A (p14ARF), CDKN2A (p16INK4a), CKD4, CHEK2, CTNNA1, DICER1, EPCAM (Deletion/duplication testing only), GREM1 (promoter region deletion/duplication testing only), KIT, MEN1, MLH1, MSH2, MSH3, MSH6, MUTYH, NBN, NF1, NHTL1, PALB2, PDGFRA, PMS2, POLD1, POLE, PTEN, RAD50, RAD51C, RAD51D, SDHB, SDHC, SDHD, SMAD4, SMARCA4. STK11, TP53, TSC1, TSC2, and VHL.  The following genes were evaluated for sequence changes only: SDHA and HOXB13 c.251G>A variant only.   Results: POSITIVE. 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 The date of this test report is 02/15/2018   10/20/2019 PET scan   Bone mets involving Right hip, Bil Iliac bones, T8, T9, L2 and L3, Rt prox humerus, Righ glenoid. Some lytic and some sclerotic. Mild Left supraclav adenopathy, 2 small lesions basal ganglia (could be lacunar infarcts)   11/14/2019 - 12/26/2019 Chemotherapy   The patient had palonosetron (ALOXI) injection 0.25 mg, 0.25 mg, Intravenous,  Once, 2 of 5 cycles Administration: 0.25 mg (12/05/2019), 0.25 mg (12/26/2019) ado-trastuzumab emtansine (  KADCYLA) 260 mg in sodium chloride 0.9 % 250 mL chemo  infusion, 3.6 mg/kg = 260 mg, Intravenous, Once, 3 of 6 cycles Administration: 260 mg (11/14/2019), 260 mg (12/05/2019), 260 mg (12/26/2019)   for chemotherapy treatment.     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  Required TPN briefly.  Patient was able to swallow and keep food down by the time of discharge.   02/06/2020 Miscellaneous   Talazoparib   07/15/2021 - 07/15/2021 Chemotherapy   Patient is on Treatment Plan : Intrathecal Methotrexate     08/14/2021 -  Chemotherapy   Patient is on Treatment Plan : BREAST Capecitabine + Trastuzumab + Tucatinib q21d     Fallopian tube cancer, carcinoma, right ([place])  11/04/2017 Surgery   Uterus, cervix and bilateral fallopian tubes: Fallopian tube with serous carcinoma 0.6 cm positive for p53, PAX8, WT-1, MOC-31, cytokeratin 7, and estrogen receptor   01/11/2018 -  Chemotherapy   Taxol and carboplatin every 3 weeks (Taxol discontinued with cycle 2 because of profound rash)   10/20/2019 PET scan   Bone mets involving Right hip, Bil Iliac bones, T8, T9, L2 and L3, Rt prox humerus, Righ glenoid. Some lytic and some sclerotic. Mild Left supraclav adenopathy, 2 small lesions basal ganglia (could be lacunar infarcts)   11/14/2019 - 12/26/2019 Chemotherapy   The patient had palonosetron (ALOXI) injection 0.25 mg, 0.25 mg, Intravenous,  Once, 2 of 5 cycles Administration: 0.25 mg (12/05/2019), 0.25 mg (12/26/2019) ado-trastuzumab emtansine (KADCYLA) 260 mg in sodium chloride 0.9 % 250 mL chemo infusion, 3.6 mg/kg = 260 mg, Intravenous, Once, 3 of 6 cycles Administration: 260 mg (11/14/2019), 260 mg (12/05/2019), 260 mg (12/26/2019)   for chemotherapy treatment.     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 female returns for follow up of her metastatic breast cancer.  She was seen last week about her nausea and vomiting, and stopping Tucatinib had reduced her nausea and she had improved.  However, since then, her nausea and vomiting has returned.  She is experiencing increased weakness.  She has not taken any of her anti emetics.  She has Zofran, Compazine, and Phenergan suppository form prescribed.     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[place]) 11/03/2019   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[place]) 11/03/2019   Elevated CA-125 10/09/2019   Paresthesia 04/17/2019   BRCA2 gene mutation positive 02/17/2018   Genetic testing 02/17/2018  Family history of breast cancer    Family history of colon cancer    Fallopian tube cancer, carcinoma, right ([place]) 11/22/2017   S/P vaginal hysterectomy 11/04/2017   Ovarian mass, left 08/26/2017   Menorrhagia 08/26/2017   Port-A-Cath in place 07/23/2017   Malignant neoplasm of lower-outer quadrant of left breast of female, estrogen receptor negative ([place]) 05/13/2017   Abdominal bloating 04/02/2017   Chronic  right-sided low back pain without sciatica 07/02/2016   DM type 2 (diabetes mellitus, type 2) ([place]) 11/18/2014   Intermittent constipation 06/06/2010   UNSPECIFIED IRON DEFICIENCY ANEMIA 04/22/2010   DEPRESSION 04/10/2010   Hearing loss 04/10/2010    has No Known Allergies.  MEDICAL HISTORY: Past Medical History:  Diagnosis Date   Arthritis    knees, elbows   Borderline glaucoma of right eye    BRCA2 gene mutation positive 02/17/2018   BRCA2 c.4552del (p.Glu1518Asnfs*25)  Result reported out on 02/15/2018.    Breast cancer ([place]) 05/2017   left   Chronic back pain    Deaf    per pt born hearing and at age 62 1/2 lost hearing , was told by mother unknown cause, can miminally hear in left and no hearing on right    Depression    Elevated cancer antigen 125 (CA 125)    Family history of breast cancer    Family history of breast cancer    Family history of colon cancer    Genetic testing 02/17/2018   The Common Hereditary Cancer Panel offered by [facility] includes sequencing and/or deletion duplication testing of the following 47 genes: APC, ATM, AXIN2, BARD1, BMPR1A, BRCA1, BRCA2, BRIP1, CDH1, CDKN2A (p14ARF), CDKN2A (p16INK4a), CKD4, CHEK2, CTNNA1, DICER1, EPCAM (Deletion/duplication testing only), GREM1 (promoter region deletion/duplication testing only), KIT, MEN1, MLH1, MSH2, MSH3, MSH6, MU   GERD (gastroesophageal reflux disease)    History of cancer chemotherapy    left breast cancer 09-08-2017 to 10-09-2017;  fallopion tube cancer  12-15-2017  to 06-28-2018   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 oncologist-  dr [name]   11-04-2017  s/p  LAVH w/ BSO,  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;  completed chemo 06-28-2018   History of external beam radiation therapy    left breast  11-25-2017  to 01-07-2018   Hyperlipidemia    Hypertension    followed by pcp   (10-05-2019  per pt never had stress test)   IDA (iron deficiency anemia)     Malignant neoplasm of lower-outer quadrant of left breast of female, estrogen receptor negative [facility]) oncologist-- dr [name]   dx 08/ 2018--- Stage IA, DCIS,  ER/ PR negative,  HER-2 positive;  06-09-2017 s/p left breast lumpectomy with node dissection;  completed chemo 10-09-2017  and radiation 01-07-2018/  hercepton completed 06-28-2018   Non-insulin dependent type 2 diabetes mellitus ([place])    followed by pcp   (10-05-2019 per pt check cbg every other day in AM,  fasting cbg-- 105)   Numbness of right thumb    Wears glasses     SURGICAL HISTORY: Past Surgical History:  Procedure Laterality Date   BREAST LUMPECTOMY WITH RADIOACTIVE SEED AND SENTINEL LYMPH NODE BIOPSY Left 06/09/2017   Procedure: BREAST LUMPECTOMY WITH RADIOACTIVE SEED AND SENTINEL LYMPH NODE BIOPSY;  Surgeon: Excell Seltzer, MD;  Location: [facility];  Service: General;  Laterality: Left;   COLONOSCOPY  11/02/2017   polyps   HERNIA REPAIR     HIATAL  HERNIA REPAIR  09/2019   IR RADIOLOGIST EVAL & MGMT  07/09/2021   IR RADIOLOGIST EVAL & MGMT  07/24/2021   IR RADIOLOGIST EVAL & MGMT  09/30/2021   LAPAROSCOPIC ASSISTED VAGINAL HYSTERECTOMY N/A 11/04/2017   Procedure: LAPAROSCOPIC ASSISTED VAGINAL HYSTERECTOMY;  Surgeon: [name], MD;  Location: [facility];  Service: Gynecology;  Laterality: N/A;   LAPAROSCOPIC BILATERAL SALPINGO OOPHERECTOMY Bilateral 11/04/2017   Procedure: LAPAROSCOPIC BILATERAL SALPINGO OOPHORECTOMY;  Surgeon: [name], MD;  Location: [facility];  Service: Gynecology;  Laterality: Bilateral;   LAPAROSCOPY N/A 10/09/2019   Procedure: LAPAROSCOPY DIAGNOSTIC WITH PERITONEAL BIOPSIES;  Surgeon: [name], MD;  Location: [facility];  Service: Gynecology;  Laterality: N/A;   PORTACATH PLACEMENT Right 06/09/2017   Procedure: INSERTION PORT-A-CATH WITH [place];  Surgeon: Excell Seltzer, MD;  Location: [facility];  Service: General;  Laterality: Right;    PORTACATH PLACEMENT Right 11/13/2019   Procedure: INSERTION PORT-A-CATH WITH ULTRASOUND GUIDANCE;  Surgeon: Rolm Bookbinder, MD;  Location: [facility];  Service: General;  Laterality: Right;   RADIOLOGY WITH ANESTHESIA N/A 08/27/2021   Procedure: IR WITH ANESTHESIA MICROWAVE ABLATION OF LIVER;  Surgeon: Aletta Edouard, MD;  Location: [facility];  Service: Radiology;  Laterality: N/A;   TUBAL LIGATION  02/02/2002   _0    PPTL   UPPER GI ENDOSCOPY      SOCIAL HISTORY: Social History   Socioeconomic History   Marital status: Legally Separated    Spouse name: Not on file   Number of children: 2   Years of education: Not on file   Highest education level: Not on file  Occupational History   Not on file  Tobacco Use   Smoking status: Never   Smokeless tobacco: Never  Vaping Use   Vaping Use: Never used  Substance and Sexual Activity   Alcohol use: Not Currently    Comment: very rare   Drug use: Never   Sexual activity: Not Currently    Birth control/protection: Surgical  Other Topics Concern   Not on file  Social History Narrative   1 boy and 1 girl   Resides in [place]   Seperated   Social Determinants of Health   Financial Resource Strain: Not on file  Food Insecurity: Not on file  Transportation Needs: Not on file  Physical Activity: Not on file  Stress: Not on file  Social Connections: Not on file  Intimate Partner Violence: Not on file    FAMILY HISTORY: Family History  Problem Relation Age of Onset   Hypertension Mother    Lupus Mother    Diabetes Father    Hypertension Sister    Diabetes Paternal Grandmother    Colon cancer Paternal Grandmother 59   CAD Brother    Diabetes Brother    Heart attack Brother    Breast cancer Maternal Aunt        dx >50   Heart attack Paternal Grandfather    Breast cancer Maternal Aunt        dx under 68   Breast cancer Maternal Aunt        dx  under 50    Review of Systems  Constitutional:  Positive  for fatigue. Negative for appetite change, chills, fever and unexpected weight change.  HENT:   Negative for hearing loss, lump/mass and trouble swallowing.   Eyes:  Negative for eye problems and icterus.  Respiratory:  Negative for chest tightness, cough and shortness of breath.  Cardiovascular:  Negative for chest pain, leg swelling and palpitations.  Gastrointestinal:  Positive for nausea and vomiting. Negative for abdominal distention, abdominal pain, constipation and diarrhea.  Endocrine: Negative for hot flashes.  Genitourinary:  Negative for difficulty urinating.   Musculoskeletal:  Negative for arthralgias.  Skin:  Negative for itching and rash.  Neurological:  Negative for dizziness, extremity weakness, headaches and numbness.  Hematological:  Negative for adenopathy. Does not bruise/bleed easily.  Psychiatric/Behavioral:  Negative for depression. The patient is not nervous/anxious.      PHYSICAL EXAMINATION  ECOG PERFORMANCE STATUS: 2 - Symptomatic, <50% confined to bed  Vitals:   11/05/21 1034  BP: 116/67  Pulse: (!) 102  Resp: 16  Temp: 97.7 F (36.5 C)  SpO2: 99%    Physical Exam Constitutional:      General: She is not in acute distress.    Appearance: She is not toxic-appearing.     Comments: Patient appears tired   HENT:     Head: Normocephalic and atraumatic.  Eyes:     General: No scleral icterus. Cardiovascular:     Rate and Rhythm: Normal rate and regular rhythm.     Pulses: Normal pulses.     Heart sounds: Normal heart sounds.  Pulmonary:     Effort: Pulmonary effort is normal.     Breath sounds: Normal breath sounds.  Abdominal:     General: Abdomen is flat. Bowel sounds are normal. There is no distension.     Palpations: Abdomen is soft.     Tenderness: There is no abdominal tenderness.  Musculoskeletal:        General: No swelling.     Cervical back: Neck supple.  Lymphadenopathy:     Cervical: No cervical adenopathy.  Skin:    General:  Skin is warm and dry.     Findings: No rash.  Neurological:     General: No focal deficit present.     Mental Status: She is alert.  Psychiatric:        Mood and Affect: Mood normal.        Behavior: Behavior normal.    LABORATORY DATA:  CBC    Component Value Date/Time   WBC 8.3 11/05/2021 1010   WBC 6.6 09/16/2021 1742   RBC 3.18 (L) 11/05/2021 1010   HGB 10.4 (L) 11/05/2021 1010   HGB 10.9 (L) 04/04/2021 0920   HGB 9.3 (L) 09/15/2017 0800   HCT 32.0 (L) 11/05/2021 1010   HCT 31.5 (L) 04/04/2021 0920   HCT 28.2 (L) 09/15/2017 0800   PLT 168 11/05/2021 1010   PLT 65 (LL) 04/04/2021 0920   MCV 100.6 (H) 11/05/2021 1010   MCV 102 (H) 04/04/2021 0920   MCV 82.4 09/15/2017 0800   MCH 32.7 11/05/2021 1010   MCHC 32.5 11/05/2021 1010   RDW 16.3 (H) 11/05/2021 1010   RDW 14.6 04/04/2021 0920   RDW 20.8 (H) 09/15/2017 0800   LYMPHSABS 0.7 11/05/2021 1010   LYMPHSABS 1.2 04/04/2021 0920   LYMPHSABS 2.5 09/15/2017 0800   MONOABS 0.5 11/05/2021 1010   MONOABS 0.8 09/15/2017 0800   EOSABS 0.0 11/05/2021 1010   EOSABS 0.0 04/04/2021 0920   BASOSABS 0.0 11/05/2021 1010   BASOSABS 0.0 04/04/2021 0920   BASOSABS 0.0 09/15/2017 0800    CMP     Component Value Date/Time   NA 137 11/05/2021 1010   NA 141 04/04/2021 0920   NA 139 09/15/2017 0800   K 3.9 11/05/2021 1010   K  3.8 09/15/2017 0800   CL 102 11/05/2021 1010   CO2 28 11/05/2021 1010   CO2 23 09/15/2017 0800   GLUCOSE 162 (H) 11/05/2021 1010   GLUCOSE 113 09/15/2017 0800   BUN 15 11/05/2021 1010   BUN 11 04/04/2021 0920   BUN 8.7 09/15/2017 0800   CREATININE 0.58 11/05/2021 1010   CREATININE 0.7 09/15/2017 0800   CALCIUM 9.3 11/05/2021 1010   CALCIUM 9.2 09/15/2017 0800   PROT 7.7 11/05/2021 1010   PROT 7.2 04/04/2021 0920   PROT 7.3 09/15/2017 0800   ALBUMIN 4.0 11/05/2021 1010   ALBUMIN 4.2 04/04/2021 0920   ALBUMIN 3.3 (L) 09/15/2017 0800   AST 25 11/05/2021 1010   AST 13 09/15/2017 0800   ALT 44  11/05/2021 1010   ALT 19 09/15/2017 0800   ALKPHOS 116 11/05/2021 1010   ALKPHOS 74 09/15/2017 0800   BILITOT 0.5 11/05/2021 1010   BILITOT 0.27 09/15/2017 0800   GFRNONAA >60 11/05/2021 1010   GFRAA >60 06/14/2020 0821    ASSESSMENT and PLAN:   Malignant neoplasm of lower-outer quadrant of left breast of female, estrogen receptor negative ([place]) Left lumpectomy: IDC grade 3, 1.2 cm, DCIS is present, margins negative, 0/6 lymph nodes negative, ER 0%, PR 0%, HER-2 positive ratio 2.28, Ki-67 20%, T1 BN 0 stage IA S/P Taxol-Herceptin BRCA 2 Mutation Positive   10/20/19: Bone mets involving Right hip, Bil Iliac bones, T8, T9, L2 and L3, Rt prox humerus, Righ glenoid. Some lytic and some sclerotic. Mild Left supraclav adenopathy, 2 small lesions basal ganglia (could be lacunar infarcts)   Biopsy the supra clav LN: 10/31/2019: Metastatic breast cancer, ER/PR negative HER-2 equivocal by IHC, FISH positive Treatment summary: Kadcyla every 3-week treatment.  Discontinued for progression   MRI of the back 01/07/2020: Extensive metastatic disease to the involved spine.  Tumor replacement of L3 vertebral body with pathologic compression fracture, epidural and extraosseous extension.  Tumor extending throughout L3 foramen Status post palliative radiation therapy Causing severe dysphagia and dehydration and hospitalization 01/29/2020-02/03/2020 Talazoparib started 02/07/2020  CT CAP 09/20/19: No evidence of ST mets, tiny inferior right hepatic lesion substantially decreased (3 mm), bone mets have become sclerotic suggest healing CT CAP 12/26/2020: Stable diffuse bone mets CT CAP 07/01/2021: Stable bone metastases.  New liver lesion 1.5 cm Brain MRI 07/12/2021: Abnormal cerebellar signal suspicious for leptomeningeal spread. Lumbar puncture 07/15/2021: Highly suspicious for leptomeningeal carcinomatosis  08/12/2021 through 08/25/2021 whole brain radiation therapy was completed Tucatinib plus Xeloda plus  Herceptin based on HER-2 Climb study beginning 12 1, 2022. Liver ablation was completed on August 27, 2021 ------------------------------------------------------------------------------------------------------------------------------------------------------ Current treatment: Tucatinib, capecitabine, trastuzumab (Tucatinib held for nausea and diarrhea), Xeloda discontinued   Toxicities: 1. Profound Nausea: With severe acid reflux.  This has unfortunately returned despite stopping tucatinib.  She will receive IV fluids today.  I placed a GI referral and we recommended she use the phenergan suppositories and Zofran ODT. HPylori is pending.. 2.  Recent scans: I reviewed with her these results and that her cancer has improved.  Davan will return on 2/27 for labs, f/u.  All questions were answered. The patient knows to call the clinic with any problems, questions or concerns. We can certainly see the patient much sooner if necessary.  Wilber Bihari, NP 11/06/21 9:40 PM Medical Oncology and Hematology [facility] [place], [place] [address] Tel. [phone]    Fax. [phone]  *Total Encounter Time as defined by the Centers for Medicare and  Medicaid Services includes, in addition to the face-to-face time of a patient visit (documented in the note above) non-face-to-face time: obtaining and reviewing outside history, ordering and reviewing medications, tests or procedures, care coordination (communications with other health care professionals or caregivers) and documentation in the medical record.  Attending Note  I personally saw and examined Erin [name]. The plan of care was discussed with her. I agree with the physical exam findings and assessment and plan as documented above. I performed the majority of the counseling and assessment and plan regarding this encounter Profound nausea issues: This is in spite of stopping all of her treatment.  We will  prescribe Phenergan suppositories as well as Zofran ODT. Gastroenterology consultation will be obtained. Reassess her symptoms and follow-up. Signed Harriette Ohara, MD

## 2021-11-07 ENCOUNTER — Encounter: Payer: Self-pay | Admitting: Hematology and Oncology

## 2021-11-07 ENCOUNTER — Other Ambulatory Visit: Payer: Self-pay

## 2021-11-07 ENCOUNTER — Inpatient Hospital Stay: Payer: Medicaid Other

## 2021-11-07 ENCOUNTER — Other Ambulatory Visit (HOSPITAL_COMMUNITY): Payer: Self-pay

## 2021-11-07 VITALS — BP 114/58 | HR 78 | Temp 98.4°F | Resp 16

## 2021-11-07 DIAGNOSIS — Z5112 Encounter for antineoplastic immunotherapy: Secondary | ICD-10-CM | POA: Diagnosis not present

## 2021-11-07 DIAGNOSIS — Z95828 Presence of other vascular implants and grafts: Secondary | ICD-10-CM

## 2021-11-07 MED ORDER — SODIUM CHLORIDE 0.9% FLUSH
10.0000 mL | Freq: Once | INTRAVENOUS | Status: AC
  Administered 2021-11-07: 10 mL via INTRAVENOUS

## 2021-11-07 MED ORDER — FAMOTIDINE IN NACL 20-0.9 MG/50ML-% IV SOLN
20.0000 mg | Freq: Once | INTRAVENOUS | Status: AC
  Administered 2021-11-07: 20 mg via INTRAVENOUS
  Filled 2021-11-07: qty 50

## 2021-11-07 MED ORDER — SODIUM CHLORIDE 0.9 % IV SOLN: Freq: Once | INTRAVENOUS | Status: AC

## 2021-11-07 MED ORDER — HEPARIN SOD (PORK) LOCK FLUSH 100 UNIT/ML IV SOLN
500.0000 [IU] | Freq: Once | INTRAVENOUS | Status: AC
  Administered 2021-11-07: 500 [IU] via INTRAVENOUS

## 2021-11-07 NOTE — Patient Instructions (Signed)

## 2021-11-08 ENCOUNTER — Other Ambulatory Visit: Payer: Self-pay

## 2021-11-08 ENCOUNTER — Inpatient Hospital Stay: Payer: Medicaid Other

## 2021-11-08 VITALS — BP 119/64 | HR 66 | Temp 98.1°F | Resp 16

## 2021-11-08 DIAGNOSIS — Z5112 Encounter for antineoplastic immunotherapy: Secondary | ICD-10-CM | POA: Diagnosis not present

## 2021-11-08 MED ORDER — SODIUM CHLORIDE 0.9 % IV SOLN: Freq: Once | INTRAVENOUS | Status: AC

## 2021-11-08 MED ORDER — FAMOTIDINE IN NACL 20-0.9 MG/50ML-% IV SOLN
20.0000 mg | Freq: Once | INTRAVENOUS | Status: AC
  Administered 2021-11-08: 20 mg via INTRAVENOUS

## 2021-11-08 NOTE — Progress Notes (Signed)
Pt tolerated IVF and Pepcid well. Pt was transported out of facility in w/c. Interpreted through interpreter to call office with any concerns. Pt verbalized understanding.

## 2021-11-10 ENCOUNTER — Other Ambulatory Visit (HOSPITAL_COMMUNITY): Payer: Self-pay

## 2021-11-10 ENCOUNTER — Inpatient Hospital Stay: Payer: Medicaid Other

## 2021-11-10 ENCOUNTER — Inpatient Hospital Stay (HOSPITAL_BASED_OUTPATIENT_CLINIC_OR_DEPARTMENT_OTHER): Payer: Medicaid Other | Admitting: Hematology and Oncology

## 2021-11-10 ENCOUNTER — Other Ambulatory Visit: Payer: Self-pay

## 2021-11-10 ENCOUNTER — Inpatient Hospital Stay (HOSPITAL_BASED_OUTPATIENT_CLINIC_OR_DEPARTMENT_OTHER): Payer: Medicaid Other

## 2021-11-10 ENCOUNTER — Encounter: Payer: Self-pay | Admitting: Adult Health

## 2021-11-10 DIAGNOSIS — Z171 Estrogen receptor negative status [ER-]: Secondary | ICD-10-CM | POA: Diagnosis not present

## 2021-11-10 DIAGNOSIS — C50512 Malignant neoplasm of lower-outer quadrant of left female breast: Secondary | ICD-10-CM

## 2021-11-10 DIAGNOSIS — Z5112 Encounter for antineoplastic immunotherapy: Secondary | ICD-10-CM | POA: Diagnosis not present

## 2021-11-10 DIAGNOSIS — Z7189 Other specified counseling: Secondary | ICD-10-CM

## 2021-11-10 DIAGNOSIS — Z95828 Presence of other vascular implants and grafts: Secondary | ICD-10-CM

## 2021-11-10 DIAGNOSIS — C7949 Secondary malignant neoplasm of other parts of nervous system: Secondary | ICD-10-CM

## 2021-11-10 DIAGNOSIS — C7951 Secondary malignant neoplasm of bone: Secondary | ICD-10-CM

## 2021-11-10 LAB — CMP (CANCER CENTER ONLY)
ALT: 45 U/L — ABNORMAL HIGH (ref 0–44)
AST: 23 U/L (ref 15–41)
Albumin: 3.9 g/dL (ref 3.5–5.0)
Alkaline Phosphatase: 113 U/L (ref 38–126)
Anion gap: 6 (ref 5–15)
BUN: 12 mg/dL (ref 6–20)
CO2: 28 mmol/L (ref 22–32)
Calcium: 9.3 mg/dL (ref 8.9–10.3)
Chloride: 105 mmol/L (ref 98–111)
Creatinine: 0.61 mg/dL (ref 0.44–1.00)
GFR, Estimated: >60 mL/min (ref >60)
Glucose, Bld: 124 mg/dL — ABNORMAL HIGH (ref 70–99)
Potassium: 3.8 mmol/L (ref 3.5–5.1)
Sodium: 139 mmol/L (ref 135–145)
Total Bilirubin: 0.4 mg/dL (ref 0.3–1.2)
Total Protein: 7.5 g/dL (ref 6.5–8.1)

## 2021-11-10 LAB — CBC WITH DIFFERENTIAL (CANCER CENTER ONLY)
Abs Immature Granulocytes: 0.03 10*3/uL (ref 0.00–0.07)
Basophils Absolute: 0 10*3/uL (ref 0.0–0.1)
Basophils Relative: 0 %
Eosinophils Absolute: 0 10*3/uL (ref 0.0–0.5)
Eosinophils Relative: 1 %
HCT: 30.6 % — ABNORMAL LOW (ref 36.0–46.0)
Hemoglobin: 10.1 g/dL — ABNORMAL LOW (ref 12.0–15.0)
Immature Granulocytes: 1 %
Lymphocytes Relative: 14 %
Lymphs Abs: 0.8 10*3/uL (ref 0.7–4.0)
MCH: 32.7 pg (ref 26.0–34.0)
MCHC: 33 g/dL (ref 30.0–36.0)
MCV: 99 fL (ref 80.0–100.0)
Monocytes Absolute: 0.4 10*3/uL (ref 0.1–1.0)
Monocytes Relative: 8 %
Neutro Abs: 4.5 10*3/uL (ref 1.7–7.7)
Neutrophils Relative %: 76 %
Platelet Count: 175 10*3/uL (ref 150–400)
RBC: 3.09 MIL/uL — ABNORMAL LOW (ref 3.87–5.11)
RDW: 16.2 % — ABNORMAL HIGH (ref 11.5–15.5)
WBC Count: 5.8 10*3/uL (ref 4.0–10.5)
nRBC: 0 % (ref 0.0–0.2)

## 2021-11-10 MED ORDER — SODIUM CHLORIDE 0.9% FLUSH: 10.0000 mL | INTRAVENOUS | Status: DC | PRN

## 2021-11-10 MED ORDER — CLARITHROMYCIN 500 MG PO TABS
500.0000 mg | ORAL_TABLET | Freq: Two times a day (BID) | ORAL | 0 refills | Status: DC
  Filled 2021-11-10: qty 28, 14d supply, fill #0

## 2021-11-10 MED ORDER — ALTEPLASE 2 MG IJ SOLR: 2.0000 mg | Freq: Once | INTRAMUSCULAR | Status: DC | PRN

## 2021-11-10 MED ORDER — SODIUM CHLORIDE 0.9 % IV SOLN: Freq: Once | INTRAVENOUS | Status: DC

## 2021-11-10 MED ORDER — SODIUM CHLORIDE 0.9% FLUSH: 3.0000 mL | Freq: Once | INTRAVENOUS | Status: DC | PRN

## 2021-11-10 MED ORDER — METRONIDAZOLE 500 MG PO TABS
500.0000 mg | ORAL_TABLET | Freq: Three times a day (TID) | ORAL | 0 refills | Status: DC
Start: 2021-11-10 — End: 2021-11-25
  Filled 2021-11-10: qty 42, 14d supply, fill #0

## 2021-11-10 MED ORDER — SODIUM CHLORIDE 0.9% FLUSH
10.0000 mL | Freq: Once | INTRAVENOUS | Status: AC | PRN
  Administered 2021-11-10: 10 mL

## 2021-11-10 MED ORDER — HEPARIN SOD (PORK) LOCK FLUSH 100 UNIT/ML IV SOLN
500.0000 [IU] | Freq: Once | INTRAVENOUS | Status: AC | PRN
  Administered 2021-11-10: 500 [IU]

## 2021-11-10 MED ORDER — SODIUM CHLORIDE 0.9 % IV SOLN: INTRAVENOUS | Status: DC

## 2021-11-10 MED ORDER — HEPARIN SOD (PORK) LOCK FLUSH 100 UNIT/ML IV SOLN: 500.0000 [IU] | Freq: Once | INTRAVENOUS | Status: DC | PRN

## 2021-11-10 MED ORDER — HEPARIN SOD (PORK) LOCK FLUSH 100 UNIT/ML IV SOLN: 250.0000 [IU] | Freq: Once | INTRAVENOUS | Status: DC | PRN

## 2021-11-10 NOTE — Assessment & Plan Note (Addendum)
Left lumpectomy: IDC grade 3, 1.2 cm, DCIS is present, margins negative, 0/6 lymph nodes negative, ER 0%, PR 0%, HER-2 positive ratio 2.28, Ki-67 20%, T1 BN 0 stage IA S/P Taxol-Herceptin BRCA 2 Mutation Positive  10/20/19:Bone mets involving Right hip, Bil Iliac bones, T8, T9, L2 and L3, Rt prox humerus, Righ glenoid. Some lytic and some sclerotic. Mild Left supraclav adenopathy, 2 small lesions basal ganglia (could be lacunar infarcts)  Biopsy the supra clav LN: 10/31/2019: Metastatic breast cancer, ER/PR negative HER-2 equivocal by IHC, FISHpositive Treatmentsummary: Kadcyla every 3-week treatment.Discontinued for progression  MRI of the back 01/07/2020: Extensive metastatic disease to the involved spine. Tumor replacement of L3 vertebral body with pathologic compression fracture, epidural and extraosseous extension. Tumor extending throughout L3 foramen Status post palliative radiation therapyCausing severe dysphagia and dehydration and hospitalization5/17/2021-02/03/2020 Talazoparib started 02/07/2020 CT CAP 09/20/19: No evidence of ST mets, tiny inferior right hepatic lesion substantially decreased(3 mm), bone mets have become sclerotic suggest healing CT CAP 12/26/2020:Stable diffuse bone mets CT CAP 07/01/2021: Stable bone metastases. New liver lesion 1.5 cm Brain MRI 07/12/2021: Abnormal cerebellar signal suspicious for leptomeningeal spread. Lumbar puncture 07/15/2021: Highly suspicious for leptomeningeal carcinomatosis 08/12/2021 through 08/25/2021 whole brain radiation therapy was completed Tucatinib plus Xeloda plus Herceptin based on HER-2 Climb study beginning 12 1, 2022. Liver ablation was completed on August 27, 2021 ------------------------------------------------------------------------------------------------------------------------------------------------------ Current treatment:Tucatinib, capecitabine, trastuzumab(Tucatinib held for nausea and diarrhea), Xeloda  discontinued  Toxicities: 1.Profound Nausea: With severe acid reflux.  This has unfortunately returned despite stopping tucatinib.   GI referral and we recommended she use the phenergan suppositories and Zofran ODT. HPylori is positive 2.   MRI brain 10/26/2021: Substantial improvement of cerebellar leptomeningeal metastatic disease. 3.  CT CAP 10/24/2021: Subcentimeter liver hypodensity, similar appearing 3.4 cm lesion persistent bone metastases.  Chronic compression fracture L3 and mild fracture T6 and T7

## 2021-11-10 NOTE — Progress Notes (Signed)
Met with patient via interpreter and her mom at registration to introduce myself as Arboriculturist and to complete grant process.  Patient singed Programmer, multimedia and discussed grant expenses in detail and how they are covered. She has a copy of the approval letter and expense sheet along with the Outpatient pharmacy information. She received a gift card today from her grant.  She has a copy of the paperwork along with my card for any additional financial questions or concerns.

## 2021-11-10 NOTE — Progress Notes (Signed)
Patient Care Team: [name], NP as PCP - General (Nurse Practitioner) Excell Seltzer, MD (Inactive) as Consulting Physician (General Surgery) [name], MD as Consulting Physician (Hematology and Oncology) Kyung Rudd, MD as Consulting Physician (Radiation Oncology) [name], MD as Consulting Physician (Gynecologic Oncology) Delice Bison, Charlestine Massed, NP as Nurse Practitioner (Hematology and Oncology) Mauro Kaufmann, RN as Oncology Nurse Navigator Rockwell Germany, RN as Oncology Nurse Navigator  DIAGNOSIS:  Encounter Diagnosis  Name Primary?   Malignant neoplasm of lower-outer quadrant of left breast of female, estrogen receptor negative ([name])     SUMMARY OF ONCOLOGIC HISTORY: Oncology History  Malignant neoplasm of lower-outer quadrant of left breast of female, estrogen receptor negative ([name])  05/11/2017 Initial Diagnosis   Left breast biopsy 3:30 position 6 cm from nipple: IDC with DCIS, lymphovascular invasion present, grade 2-3, ER 0%, PR 0%, HER-2 positive ratio 2.28, Ki-67 20%, 1 cm lesion left breast, T1b N0 stage IA clinical stage   06/09/2017 Surgery   Left lumpectomy: IDC grade 3, 1.2 cm, DCIS is present, margins negative, 0/6 lymph nodes negative, ER 0%, PR 0%, HER-2 positive ratio 2.28, Ki-67 20%, T1 BN 0 stage IA   07/09/2017 - 09/29/2017 Adjuvant Chemotherapy   Taxol/Herceptin x 12 completed on 09/29/2017, went on to receive every 3 week adjuvant Herceptin through 06/28/2018   11/25/2017 - 01/07/2018 Radiation Therapy    The patient initially received a dose of 50.4 Gy in 28 fractions to the breast using whole-breast tangent fields. This was delivered using a 3-D conformal technique. The patient then received a boost to the seroma. This delivered an additional 10 Gy in 5 fractions using a 3-D technique. The total dose was 60.4 Gy.   02/15/2018 Genetic Testing   The Common Hereditary Cancer Panel offered by [facility] includes sequencing and/or deletion  duplication testing of the following 47 genes: APC, ATM, AXIN2, BARD1, BMPR1A, BRCA1, BRCA2, BRIP1, CDH1, CDKN2A (p14ARF), CDKN2A (p16INK4a), CKD4, CHEK2, CTNNA1, DICER1, EPCAM (Deletion/duplication testing only), GREM1 (promoter region deletion/duplication testing only), KIT, MEN1, MLH1, MSH2, MSH3, MSH6, MUTYH, NBN, NF1, NHTL1, PALB2, PDGFRA, PMS2, POLD1, POLE, PTEN, RAD50, RAD51C, RAD51D, SDHB, SDHC, SDHD, SMAD4, SMARCA4. STK11, TP53, TSC1, TSC2, and VHL.  The following genes were evaluated for sequence changes only: SDHA and HOXB13 c.251G>A variant only.   Results: POSITIVE. 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 The date of this test report is 02/15/2018   10/20/2019 PET scan   Bone mets involving Right hip, Bil Iliac bones, T8, T9, L2 and L3, Rt prox humerus, Righ glenoid. Some lytic and some sclerotic. Mild Left supraclav adenopathy, 2 small lesions basal ganglia (could be lacunar infarcts)   11/14/2019 - 12/26/2019 Chemotherapy   The patient had palonosetron (ALOXI) injection 0.25 mg, 0.25 mg, Intravenous,  Once, 2 of 5 cycles Administration: 0.25 mg (12/05/2019), 0.25 mg (12/26/2019) ado-trastuzumab emtansine (KADCYLA) 260 mg in sodium chloride 0.9 % 250 mL chemo infusion, 3.6 mg/kg = 260 mg, Intravenous, Once, 3 of 6 cycles Administration: 260 mg (11/14/2019), 260 mg (12/05/2019), 260 mg (12/26/2019)   for chemotherapy treatment.     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  Required TPN briefly.  Patient was able to swallow and keep food down by the time of discharge.   02/06/2020 Miscellaneous   Talazoparib   07/15/2021 - 07/15/2021 Chemotherapy   Patient is on Treatment Plan :  Intrathecal Methotrexate     08/14/2021 -  Chemotherapy   Patient is on Treatment Plan : BREAST Capecitabine + Trastuzumab + Tucatinib q21d      Fallopian tube cancer, carcinoma, right ([place])  11/04/2017 Surgery   Uterus, cervix and bilateral fallopian tubes: Fallopian tube with serous carcinoma 0.6 cm positive for p53, PAX8, WT-1, MOC-31, cytokeratin 7, and estrogen receptor   01/11/2018 -  Chemotherapy   Taxol and carboplatin every 3 weeks (Taxol discontinued with cycle 2 because of profound rash)   10/20/2019 PET scan   Bone mets involving Right hip, Bil Iliac bones, T8, T9, L2 and L3, Rt prox humerus, Righ glenoid. Some lytic and some sclerotic. Mild Left supraclav adenopathy, 2 small lesions basal ganglia (could be lacunar infarcts)   11/14/2019 - 12/26/2019 Chemotherapy   The patient had palonosetron (ALOXI) injection 0.25 mg, 0.25 mg, Intravenous,  Once, 2 of 5 cycles Administration: 0.25 mg (12/05/2019), 0.25 mg (12/26/2019) ado-trastuzumab emtansine (KADCYLA) 260 mg in sodium chloride 0.9 % 250 mL chemo infusion, 3.6 mg/kg = 260 mg, Intravenous, Once, 3 of 6 cycles Administration: 260 mg (11/14/2019), 260 mg (12/05/2019), 260 mg (12/26/2019)   for chemotherapy treatment.     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  She had intense nausea and vomiting and [name] performed blood work which were positive for H. pylori.  She is being referred to gastroenterology. Since she started taking  antinausea medications more frequently, she has not had any nausea or vomiting.  She has gained a few pounds.  She is drinking 2 servings of milkshakes every day.   ALLERGIES:  has No Known Allergies.  MEDICATIONS:  Current Outpatient Medications  Medication Sig Dispense Refill   Accu-Chek FastClix Lancets MISC Use as instructed. Inject into the skin twice daily 100 each 3   atorvastatin (LIPITOR) 40 MG tablet TAKE 1 TABLET BY MOUTH EVERY DAY 30 tablet 0   Blood Glucose Calibration (ACCU-CHEK GUIDE CONTROL) LIQD 1 each by In Vitro route once as needed for up to 1 dose. 1 each 0   CINNAMON PO Take 2,400 mg by mouth every evening. 1200 mg per cap     diclofenac Sodium (VOLTAREN) 1 % GEL Apply 1 application topically daily as needed (pain).     famotidine (PEPCID) 40 MG tablet Take 1 tablet (40 mg total) by mouth daily. 90 tablet 1   glucose blood (ACCU-CHEK GUIDE) test strip Use as instructed. Check blood glucose by fingerstick twice per day. 100 each 12   lidocaine-prilocaine (EMLA) cream Apply to affected area once (Patient taking differently: Apply 1 application topically daily as needed. Apply to affected area once) 30 g 3   loperamide (IMODIUM) 2 MG capsule Take 2 mg by mouth as needed for diarrhea or loose stools.     memantine (NAMENDA) 10 MG tablet Take 1 tablet (10 mg  total) by mouth 2 (two) times daily. 60 tablet 4   Multiple Vitamins-Minerals (MULTIVITAMIN ADULT) CHEW Chew 2 each by mouth daily.     ondansetron (ZOFRAN-ODT) 8 MG disintegrating tablet Dissolve 1 tablet (8 mg total) by mouth every 8 (eight) hours as needed for nausea or vomiting. 20 tablet 0   promethazine (PHENERGAN) 25 MG suppository Unwrap and insert 1 suppository rectally every 6 hours as needed for nausea or vomiting. 30 each 0   tucatinib (TUKYSA) 150 MG tablet Take 2 tablets (300 mg total) by mouth 2 (two) times daily. Take every 12 hrs at the same time each day with or without a meal. 120 tablet 6   No current  facility-administered medications for this visit.   Facility-Administered Medications Ordered in Other Visits  Medication Dose Route Frequency Provider Last Rate Last Admin   heparin lock flush 100 unit/mL  500 Units Intracatheter Once PRN [name], MD       sodium chloride flush (NS) 0.9 % injection 10 mL  10 mL Intracatheter PRN [name], MD        PHYSICAL EXAMINATION: ECOG PERFORMANCE STATUS: 2 - Symptomatic, <50% confined to bed  Vitals:   11/10/21 1022  BP: (!) 107/53  Pulse: 84  Resp: 16  Temp: (!) 97.5 F (36.4 C)  SpO2: 100%   Filed Weights   11/10/21 1022  Weight: 124 lb (56.2 kg)     LABORATORY DATA:  I have reviewed the data as listed CMP Latest Ref Rng & Units 11/05/2021 10/28/2021 10/24/2021  Glucose 70 - 99 mg/dL 162(H) 113(H) 109(H)  BUN 6 - 20 mg/dL $Remove'15 8 14  'zeXofbZ$ Creatinine 0.44 - 1.00 mg/dL 0.58 0.55 0.79  Sodium 135 - 145 mmol/L 137 140 137  Potassium 3.5 - 5.1 mmol/L 3.9 3.7 3.7  Chloride 98 - 111 mmol/L 102 106 102  CO2 22 - 32 mmol/L $RemoveB'28 27 26  'bFCzvEUZ$ Calcium 8.9 - 10.3 mg/dL 9.3 9.0 9.3  Total Protein 6.5 - 8.1 g/dL 7.7 7.2 8.2(H)  Total Bilirubin 0.3 - 1.2 mg/dL 0.5 0.4 0.6  Alkaline Phos 38 - 126 U/L 116 115 138(H)  AST 15 - 41 U/L $Remo'25 25 28  'ijINb$ ALT 0 - 44 U/L 44 37 43    Lab Results  Component Value Date   WBC 8.3 11/05/2021   HGB 10.4 (L) 11/05/2021   HCT 32.0 (L) 11/05/2021   MCV 100.6 (H) 11/05/2021   PLT 168 11/05/2021   NEUTROABS 7.1 11/05/2021    ASSESSMENT & PLAN:  Malignant neoplasm of lower-outer quadrant of left breast of female, estrogen receptor negative ([facility]) Left lumpectomy: IDC grade 3, 1.2 cm, DCIS is present, margins negative, 0/6 lymph nodes negative, ER 0%, PR 0%, HER-2 positive ratio 2.28, Ki-67 20%, T1 BN 0 stage IA S/P Taxol-Herceptin BRCA 2 Mutation Positive   10/20/19: Bone mets involving Right hip, Bil Iliac bones, T8, T9, L2 and L3, Rt prox humerus, Righ glenoid. Some lytic and some sclerotic. Mild Left supraclav  adenopathy, 2 small lesions basal ganglia (could be lacunar infarcts)   Biopsy the supra clav LN: 10/31/2019: Metastatic breast cancer, ER/PR negative HER-2 equivocal by IHC, FISH positive Treatment summary: Kadcyla every 3-week treatment.  Discontinued for progression   MRI of the back 01/07/2020: Extensive metastatic disease to the involved spine.  Tumor replacement of L3 vertebral body with pathologic compression fracture, epidural and extraosseous extension.  Tumor extending throughout L3 foramen Status post palliative radiation therapy Causing severe dysphagia and dehydration and  hospitalization 01/29/2020-02/03/2020 Talazoparib started 02/07/2020  CT CAP 09/20/19: No evidence of ST mets, tiny inferior right hepatic lesion substantially decreased (3 mm), bone mets have become sclerotic suggest healing CT CAP 12/26/2020: Stable diffuse bone mets CT CAP 07/01/2021: Stable bone metastases.  New liver lesion 1.5 cm Brain MRI 07/12/2021: Abnormal cerebellar signal suspicious for leptomeningeal spread. Lumbar puncture 07/15/2021: Highly suspicious for leptomeningeal carcinomatosis  08/12/2021 through 08/25/2021 whole brain radiation therapy was completed Tucatinib plus Xeloda plus Herceptin based on HER-2 Climb study beginning 12 1, 2022. Liver ablation was completed on August 27, 2021 ------------------------------------------------------------------------------------------------------------------------------------------------------ Current treatment: Tucatinib, capecitabine, trastuzumab (Tucatinib held for nausea and diarrhea), Xeloda discontinued   Toxicities: 1. Profound Nausea: With severe acid reflux.  This has unfortunately returned despite stopping tucatinib.   GI referral and we recommended she use the phenergan suppositories and Zofran ODT. HPylori is positive 2.   MRI brain 10/26/2021: Substantial improvement of cerebellar leptomeningeal metastatic disease. 3.  CT CAP 10/24/2021: Subcentimeter  liver hypodensity, similar appearing 3.4 cm lesion persistent bone metastases.  Chronic compression fracture L3 and mild fracture T6 and T7   H. pylori infection gastritis: GI referral has been sent.  I will start the patient on clarithromycin and Flagyl for 14 days. Intractable nausea and vomiting: Slowly improving. We will administer IV fluids today.  Lower extremity weakness: I will refer her to physical therapy. Return to clinic for her next treatment.  No orders of the defined types were placed in this encounter.  The patient has a good understanding of the overall plan. she agrees with it. she will call with any problems that may develop before the next visit here. Total time spent: 30 mins including face to face time and time spent for planning, charting and co-ordination of care   Harriette Ohara, MD 11/10/21

## 2021-11-11 ENCOUNTER — Other Ambulatory Visit: Payer: Self-pay

## 2021-11-11 ENCOUNTER — Encounter: Payer: Self-pay | Admitting: Hematology and Oncology

## 2021-11-11 ENCOUNTER — Other Ambulatory Visit (HOSPITAL_COMMUNITY): Payer: Self-pay

## 2021-11-11 ENCOUNTER — Telehealth: Payer: Self-pay

## 2021-11-11 MED ORDER — OMEPRAZOLE 40 MG PO CPDR: 40.0000 mg | DELAYED_RELEASE_CAPSULE | Freq: Every day | ORAL | 1 refills | Status: DC

## 2021-11-11 MED ORDER — OMEPRAZOLE 40 MG PO CPDR
40.0000 mg | DELAYED_RELEASE_CAPSULE | Freq: Every day | ORAL | 1 refills | Status: AC
  Filled 2021-11-11: qty 30, 30d supply, fill #0

## 2021-11-11 NOTE — Telephone Encounter (Signed)
-----   Message from [name], NP sent at 11/10/2021  9:20 PM EST ----- Will you please send in Protonix or Omeprazole 40mg  daily if patient isn't already taking?  ----- Message ----- From: Interface, Lab In [place] Sent: 11/06/2021   9:35 PM EST To: [name], NP

## 2021-11-11 NOTE — Telephone Encounter (Signed)
Called and left VM, pt H Pylori positive, sent in Rx for omeprazole 40mg  to pt preferred pharmacy per [place].

## 2021-11-14 ENCOUNTER — Other Ambulatory Visit: Payer: Self-pay | Admitting: *Deleted

## 2021-11-14 ENCOUNTER — Encounter (HOSPITAL_COMMUNITY): Payer: Self-pay

## 2021-11-14 ENCOUNTER — Inpatient Hospital Stay (HOSPITAL_COMMUNITY)
Admission: EM | Admit: 2021-11-14 | Discharge: 2021-11-25 | DRG: 055 | Disposition: A | Discharge status: Rehabilitation Facility | Payer: Medicare Other | Attending: Internal Medicine | Admitting: Internal Medicine

## 2021-11-14 ENCOUNTER — Emergency Department (HOSPITAL_COMMUNITY): Payer: Medicare Other

## 2021-11-14 ENCOUNTER — Telehealth: Payer: Self-pay | Admitting: Rehabilitation

## 2021-11-14 ENCOUNTER — Other Ambulatory Visit: Payer: Self-pay

## 2021-11-14 ENCOUNTER — Ambulatory Visit: Payer: Medicaid Other | Attending: Hematology and Oncology | Admitting: Rehabilitation

## 2021-11-14 ENCOUNTER — Telehealth: Payer: Self-pay | Admitting: *Deleted

## 2021-11-14 DIAGNOSIS — E119 Type 2 diabetes mellitus without complications: Secondary | ICD-10-CM | POA: Diagnosis present

## 2021-11-14 DIAGNOSIS — C7951 Secondary malignant neoplasm of bone: Secondary | ICD-10-CM

## 2021-11-14 DIAGNOSIS — E785 Hyperlipidemia, unspecified: Secondary | ICD-10-CM | POA: Diagnosis present

## 2021-11-14 DIAGNOSIS — G8929 Other chronic pain: Secondary | ICD-10-CM | POA: Diagnosis present

## 2021-11-14 DIAGNOSIS — Z803 Family history of malignant neoplasm of breast: Secondary | ICD-10-CM

## 2021-11-14 DIAGNOSIS — Z171 Estrogen receptor negative status [ER-]: Secondary | ICD-10-CM

## 2021-11-14 DIAGNOSIS — E876 Hypokalemia: Secondary | ICD-10-CM | POA: Diagnosis not present

## 2021-11-14 DIAGNOSIS — M8458XA Pathological fracture in neoplastic disease, other specified site, initial encounter for fracture: Secondary | ICD-10-CM | POA: Diagnosis present

## 2021-11-14 DIAGNOSIS — Z833 Family history of diabetes mellitus: Secondary | ICD-10-CM

## 2021-11-14 DIAGNOSIS — M4802 Spinal stenosis, cervical region: Secondary | ICD-10-CM | POA: Diagnosis present

## 2021-11-14 DIAGNOSIS — D638 Anemia in other chronic diseases classified elsewhere: Secondary | ICD-10-CM | POA: Diagnosis present

## 2021-11-14 DIAGNOSIS — D7589 Other specified diseases of blood and blood-forming organs: Secondary | ICD-10-CM | POA: Diagnosis present

## 2021-11-14 DIAGNOSIS — B9681 Helicobacter pylori [H. pylori] as the cause of diseases classified elsewhere: Secondary | ICD-10-CM | POA: Diagnosis present

## 2021-11-14 DIAGNOSIS — W1830XA Fall on same level, unspecified, initial encounter: Secondary | ICD-10-CM | POA: Diagnosis present

## 2021-11-14 DIAGNOSIS — K2970 Gastritis, unspecified, without bleeding: Secondary | ICD-10-CM

## 2021-11-14 DIAGNOSIS — C50512 Malignant neoplasm of lower-outer quadrant of left female breast: Secondary | ICD-10-CM

## 2021-11-14 DIAGNOSIS — C7949 Secondary malignant neoplasm of other parts of nervous system: Principal | ICD-10-CM | POA: Diagnosis present

## 2021-11-14 DIAGNOSIS — E871 Hypo-osmolality and hyponatremia: Secondary | ICD-10-CM | POA: Diagnosis present

## 2021-11-14 DIAGNOSIS — H9193 Unspecified hearing loss, bilateral: Secondary | ICD-10-CM | POA: Diagnosis present

## 2021-11-14 DIAGNOSIS — Z20822 Contact with and (suspected) exposure to covid-19: Secondary | ICD-10-CM | POA: Diagnosis present

## 2021-11-14 DIAGNOSIS — R296 Repeated falls: Secondary | ICD-10-CM | POA: Diagnosis present

## 2021-11-14 DIAGNOSIS — Z923 Personal history of irradiation: Secondary | ICD-10-CM

## 2021-11-14 DIAGNOSIS — K5900 Constipation, unspecified: Secondary | ICD-10-CM | POA: Diagnosis present

## 2021-11-14 DIAGNOSIS — C787 Secondary malignant neoplasm of liver and intrahepatic bile duct: Secondary | ICD-10-CM | POA: Diagnosis present

## 2021-11-14 DIAGNOSIS — Z90722 Acquired absence of ovaries, bilateral: Secondary | ICD-10-CM

## 2021-11-14 DIAGNOSIS — Z90710 Acquired absence of both cervix and uterus: Secondary | ICD-10-CM

## 2021-11-14 DIAGNOSIS — Z79899 Other long term (current) drug therapy: Secondary | ICD-10-CM

## 2021-11-14 DIAGNOSIS — C50919 Malignant neoplasm of unspecified site of unspecified female breast: Secondary | ICD-10-CM

## 2021-11-14 DIAGNOSIS — Z1501 Genetic susceptibility to malignant neoplasm of breast: Secondary | ICD-10-CM

## 2021-11-14 DIAGNOSIS — Z9079 Acquired absence of other genital organ(s): Secondary | ICD-10-CM

## 2021-11-14 DIAGNOSIS — Z9221 Personal history of antineoplastic chemotherapy: Secondary | ICD-10-CM

## 2021-11-14 DIAGNOSIS — Z8544 Personal history of malignant neoplasm of other female genital organs: Secondary | ICD-10-CM

## 2021-11-14 DIAGNOSIS — K219 Gastro-esophageal reflux disease without esophagitis: Secondary | ICD-10-CM | POA: Diagnosis present

## 2021-11-14 DIAGNOSIS — R29898 Other symptoms and signs involving the musculoskeletal system: Secondary | ICD-10-CM

## 2021-11-14 DIAGNOSIS — I10 Essential (primary) hypertension: Secondary | ICD-10-CM | POA: Diagnosis present

## 2021-11-14 LAB — URINALYSIS, ROUTINE W REFLEX MICROSCOPIC
Bacteria, UA: NONE SEEN
Bilirubin Urine: NEGATIVE
Glucose, UA: NEGATIVE mg/dL
Hgb urine dipstick: NEGATIVE
Ketones, ur: NEGATIVE mg/dL
Nitrite: NEGATIVE
Protein, ur: NEGATIVE mg/dL
Specific Gravity, Urine: 1.029 (ref 1.005–1.030)
pH: 5 (ref 5.0–8.0)

## 2021-11-14 LAB — CBC WITH DIFFERENTIAL/PLATELET
Abs Immature Granulocytes: 0.04 10*3/uL (ref 0.00–0.07)
Basophils Absolute: 0 10*3/uL (ref 0.0–0.1)
Basophils Relative: 0 %
Eosinophils Absolute: 0 10*3/uL (ref 0.0–0.5)
Eosinophils Relative: 0 %
HCT: 37.3 % (ref 36.0–46.0)
Hemoglobin: 12.1 g/dL (ref 12.0–15.0)
Immature Granulocytes: 0 %
Lymphocytes Relative: 13 %
Lymphs Abs: 1.2 10*3/uL (ref 0.7–4.0)
MCH: 33.2 pg (ref 26.0–34.0)
MCHC: 32.4 g/dL (ref 30.0–36.0)
MCV: 102.2 fL — ABNORMAL HIGH (ref 80.0–100.0)
Monocytes Absolute: 0.7 10*3/uL (ref 0.1–1.0)
Monocytes Relative: 7 %
Neutro Abs: 7.7 10*3/uL (ref 1.7–7.7)
Neutrophils Relative %: 80 %
Platelets: 206 10*3/uL (ref 150–400)
RBC: 3.65 MIL/uL — ABNORMAL LOW (ref 3.87–5.11)
RDW: 16.8 % — ABNORMAL HIGH (ref 11.5–15.5)
WBC: 9.7 10*3/uL (ref 4.0–10.5)
nRBC: 0 % (ref 0.0–0.2)

## 2021-11-14 LAB — COMPREHENSIVE METABOLIC PANEL
ALT: 78 U/L — ABNORMAL HIGH (ref 0–44)
AST: 45 U/L — ABNORMAL HIGH (ref 15–41)
Albumin: 4 g/dL (ref 3.5–5.0)
Alkaline Phosphatase: 125 U/L (ref 38–126)
Anion gap: 10 (ref 5–15)
BUN: 19 mg/dL (ref 6–20)
CO2: 24 mmol/L (ref 22–32)
Calcium: 9.2 mg/dL (ref 8.9–10.3)
Chloride: 102 mmol/L (ref 98–111)
Creatinine, Ser: 0.69 mg/dL (ref 0.44–1.00)
GFR, Estimated: >60 mL/min (ref >60)
Glucose, Bld: 135 mg/dL — ABNORMAL HIGH (ref 70–99)
Potassium: 3.1 mmol/L — ABNORMAL LOW (ref 3.5–5.1)
Sodium: 136 mmol/L (ref 135–145)
Total Bilirubin: 0.4 mg/dL (ref 0.3–1.2)
Total Protein: 8.5 g/dL — ABNORMAL HIGH (ref 6.5–8.1)

## 2021-11-14 LAB — RESP PANEL BY RT-PCR (FLU A&B, COVID) ARPGX2
Influenza A by PCR: NEGATIVE
Influenza B by PCR: NEGATIVE
SARS Coronavirus 2 by RT PCR: NEGATIVE

## 2021-11-14 MED ORDER — ONDANSETRON HCL 4 MG PO TABS: 4.0000 mg | ORAL_TABLET | Freq: Four times a day (QID) | ORAL | Status: DC | PRN

## 2021-11-14 MED ORDER — SODIUM CHLORIDE 0.9% FLUSH
3.0000 mL | Freq: Two times a day (BID) | INTRAVENOUS | Status: DC
  Administered 2021-11-14 – 2021-11-25 (×20): 3 mL via INTRAVENOUS

## 2021-11-14 MED ORDER — PROMETHAZINE HCL 25 MG RE SUPP
25.0000 mg | Freq: Four times a day (QID) | RECTAL | Status: DC | PRN
  Administered 2021-11-25: 25 mg via RECTAL
  Filled 2021-11-14 (×2): qty 1

## 2021-11-14 MED ORDER — PANTOPRAZOLE SODIUM 40 MG PO TBEC
40.0000 mg | DELAYED_RELEASE_TABLET | Freq: Every day | ORAL | Status: DC
  Administered 2021-11-16 – 2021-11-25 (×10): 40 mg via ORAL
  Filled 2021-11-14 (×10): qty 1

## 2021-11-14 MED ORDER — OXYCODONE HCL 5 MG PO TABS
5.0000 mg | ORAL_TABLET | ORAL | Status: DC | PRN
  Administered 2021-11-15 – 2021-11-20 (×3): 5 mg via ORAL
  Filled 2021-11-14 (×4): qty 1

## 2021-11-14 MED ORDER — MEMANTINE HCL 10 MG PO TABS
10.0000 mg | ORAL_TABLET | Freq: Two times a day (BID) | ORAL | Status: DC
  Administered 2021-11-14 – 2021-11-25 (×21): 10 mg via ORAL
  Filled 2021-11-14 (×3): qty 1
  Filled 2021-11-14: qty 2
  Filled 2021-11-14 (×17): qty 1

## 2021-11-14 MED ORDER — ONDANSETRON HCL 4 MG/2ML IJ SOLN
4.0000 mg | Freq: Four times a day (QID) | INTRAMUSCULAR | Status: DC | PRN
  Administered 2021-11-22 – 2021-11-25 (×5): 4 mg via INTRAVENOUS
  Filled 2021-11-14 (×5): qty 2

## 2021-11-14 MED ORDER — POTASSIUM CHLORIDE 10 MEQ/100ML IV SOLN
10.0000 meq | Freq: Once | INTRAVENOUS | Status: AC
  Administered 2021-11-14: 10 meq via INTRAVENOUS
  Filled 2021-11-14: qty 100

## 2021-11-14 MED ORDER — ACETAMINOPHEN 650 MG RE SUPP: 650.0000 mg | Freq: Four times a day (QID) | RECTAL | Status: DC | PRN

## 2021-11-14 MED ORDER — METRONIDAZOLE 500 MG PO TABS
500.0000 mg | ORAL_TABLET | Freq: Three times a day (TID) | ORAL | Status: DC
  Administered 2021-11-14 – 2021-11-25 (×31): 500 mg via ORAL
  Filled 2021-11-14 (×31): qty 1

## 2021-11-14 MED ORDER — ENOXAPARIN SODIUM 40 MG/0.4ML IJ SOSY
40.0000 mg | PREFILLED_SYRINGE | INTRAMUSCULAR | Status: DC
  Administered 2021-11-15 – 2021-11-24 (×10): 40 mg via SUBCUTANEOUS
  Filled 2021-11-14 (×10): qty 0.4

## 2021-11-14 MED ORDER — SENNOSIDES-DOCUSATE SODIUM 8.6-50 MG PO TABS
1.0000 | ORAL_TABLET | Freq: Every evening | ORAL | Status: DC | PRN
  Administered 2021-11-16: 1 via ORAL
  Filled 2021-11-14: qty 1

## 2021-11-14 MED ORDER — ACETAMINOPHEN 325 MG PO TABS
650.0000 mg | ORAL_TABLET | Freq: Four times a day (QID) | ORAL | Status: DC | PRN
  Administered 2021-11-20: 04:00:00 650 mg via ORAL
  Filled 2021-11-14: qty 2

## 2021-11-14 MED ORDER — CLARITHROMYCIN 250 MG PO TABS
500.0000 mg | ORAL_TABLET | Freq: Two times a day (BID) | ORAL | Status: DC
  Administered 2021-11-14 – 2021-11-25 (×21): 500 mg via ORAL
  Filled 2021-11-14 (×22): qty 2

## 2021-11-14 NOTE — ED Triage Notes (Signed)
Pt coming from home via EMS with c/o increased leg weakness and numbness x two weeks and falls. Pt had 3 falls today which mother reports is more than she has had in a day before. Hx breast, bone, and ovary cancer with metastases. Currently receiving chemo treatment, last treatment was 2/22.  ? ?Pt is completely deaf.  ?

## 2021-11-14 NOTE — Assessment & Plan Note (Addendum)
Metastatic left-sided breast cancer ?Extensive osseous/spinal metastases ?Hepatic metastasis (s/p liver ablation 08/27/2021) ?S/p whole brain radiation (08/12/2021-08/25/2021) ?Bilateral lower extremity weakness ?Patient with progressive bilateral lower extremity weakness and numbness.  This could be related to cerebellar leptomeningeal metastatic disease, extensive metastatic spinal disease, and/or adverse effect of active chemotherapy. ?-Oncology recommended admission and will follow ?-EDP discussed with on-call neurology who will consult ?-MRI brain shows left >right cerebellar abnormality which may reflect edema related to leptomeningeal metastatic disease and/or gliosis ?-Consider repeat imaging of whole spine ?-PT/OT eval ?

## 2021-11-14 NOTE — ED Provider Notes (Addendum)
[facility] DEPT Provider Note   CSN: [id number] Arrival date & time: 11/14/21  1552     History  Chief Complaint  Patient presents with   Extremity Weakness   Leg Numbness    bilateral   Fall    Erin [name] is a 61 y.o. female.  Patient with a 4-week history of increasing bilateral leg weakness.  And difficulty with falls.  Triage mention numbness.  But in conversation with the patient it does not appear that there is any numbness.  Also appetite has been down.  Patient last seen by hematology oncology February 27.  Last had MRI brain February 1.  Last had a treatment on February 22.  Is receiving chemotherapy for metastatic breast cancer.  Patient is also had difficulty with some vomiting.  But that has been improved some of late with medications that hematology oncology gave her.  They state upper extremity strength is good.  Patient has not been doing much walking now for over a month.  Patient does state that she feels kind of off balance when she walks.  Patient denies any incontinence.  Past medical history otherwise significant for patient is deaf.  Type 2 diabetes hypertension, chronic back pain with patient denies any back pain or neck pain today.        Home Medications Prior to Admission medications   Medication Sig Start Date End Date Taking? Authorizing Provider  Accu-Chek FastClix Lancets MISC Use as instructed. Inject into the skin twice daily 08/05/21   [name], NP  atorvastatin (LIPITOR) 40 MG tablet TAKE 1 TABLET BY MOUTH EVERY DAY 09/29/21   Dorna Mai, MD  Blood Glucose Calibration (ACCU-CHEK GUIDE CONTROL) LIQD 1 each by In Vitro route once as needed for up to 1 dose. 12/26/18   [name], NP  CINNAMON PO Take 2,400 mg by mouth every evening. 1200 mg per cap    [provider]  clarithromycin (BIAXIN) 500 MG tablet Take 1 tablet (500 mg total) by mouth 2 (two) times daily. For 14 days 11/10/21    [name], MD  diclofenac Sodium (VOLTAREN) 1 % GEL Apply 1 application topically daily as needed (pain).    [provider]  famotidine (PEPCID) 40 MG tablet Take 1 tablet (40 mg total) by mouth daily. 10/06/21 01/04/22  [name], NP  glucose blood (ACCU-CHEK GUIDE) test strip Use as instructed. Check blood glucose by fingerstick twice per day. 11/18/20   [name], NP  lidocaine-prilocaine (EMLA) cream Apply to affected area once Patient taking differently: Apply 1 application topically daily as needed. Apply to affected area once 07/30/21   [name], MD  loperamide (IMODIUM) 2 MG capsule Take 2 mg by mouth as needed for diarrhea or loose stools.    [name], MD  memantine (NAMENDA) 10 MG tablet Take 1 tablet (10 mg total) by mouth 2 (two) times daily. 07/30/21   Hayden Pedro, PA-C  metroNIDAZOLE (FLAGYL) 500 MG tablet Take 1 tablet (500 mg total) by mouth 3 (three) times daily. 11/10/21   [name], MD  Multiple Vitamins-Minerals (MULTIVITAMIN ADULT) CHEW Chew 2 each by mouth daily.    [provider]  omeprazole (PRILOSEC) 40 MG capsule Take 1 capsule (40 mg total) by mouth daily. 11/11/21   [name], NP  ondansetron (ZOFRAN-ODT) 8 MG disintegrating tablet Dissolve 1 tablet (8 mg total) by mouth every 8 (eight) hours as needed for nausea or vomiting.  11/05/21   [name], MD  promethazine (PHENERGAN) 25 MG suppository Unwrap and insert 1 suppository rectally every 6 hours as needed for nausea or vomiting. 11/05/21   [name], MD  tucatinib (TUKYSA) 150 MG tablet Take 2 tablets (300 mg total) by mouth 2 (two) times daily. Take every 12 hrs at the same time each day with or without a meal. 07/30/21   [name], MD      Allergies    Patient has no known allergies.    Review of Systems   Review of Systems  Constitutional:  Negative for chills and fever.  HENT:  Positive for hearing loss. Negative for ear pain and  sore throat.   Eyes:  Negative for pain and visual disturbance.  Respiratory:  Negative for cough and shortness of breath.   Cardiovascular:  Negative for chest pain and palpitations.  Gastrointestinal:  Negative for abdominal pain and vomiting.  Genitourinary:  Negative for dysuria and hematuria.  Musculoskeletal:  Negative for arthralgias and back pain.  Skin:  Negative for color change and rash.  Neurological:  Positive for weakness. Negative for seizures and syncope.  All other systems reviewed and are negative.  Physical Exam Updated Vital Signs BP 112/72    Pulse 94    Temp 99 F (37.2 C) (Oral)    Resp 16    Ht 1.549 m (5\' 1" )    Wt 54 kg    LMP 07/09/2017 (LMP Unknown)    SpO2 100%    BMI 22.48 kg/m  Physical Exam Vitals and nursing note reviewed.  Constitutional:      General: She is not in acute distress.    Appearance: Normal appearance. She is well-developed.  HENT:     Head: Normocephalic and atraumatic.     Mouth/Throat:     Mouth: Mucous membranes are moist.  Eyes:     Extraocular Movements: Extraocular movements intact.     Conjunctiva/sclera: Conjunctivae normal.     Pupils: Pupils are equal, round, and reactive to light.  Cardiovascular:     Rate and Rhythm: Normal rate and regular rhythm.     Heart sounds: No murmur heard. Pulmonary:     Effort: Pulmonary effort is normal. No respiratory distress.     Breath sounds: Normal breath sounds.  Abdominal:     Palpations: Abdomen is soft.     Tenderness: There is no abdominal tenderness.  Musculoskeletal:        General: No swelling.     Cervical back: Normal range of motion and neck supple. No rigidity or tenderness.     Right lower leg: No edema.     Left lower leg: No edema.  Skin:    General: Skin is warm and dry.     Capillary Refill: Capillary refill takes less than 2 seconds.  Neurological:     Mental Status: She is alert.     Cranial Nerves: Cranial nerve deficit present.     Sensory: No sensory  deficit.     Motor: Weakness present.     Comments: Patient able to wiggle her toes.  But has equal weakness with raising left or right leg.  Upper extremities with good strength.  Psychiatric:        Mood and Affect: Mood normal.    ED Results / Procedures / Treatments   Labs (all labs ordered are listed, but only abnormal results are displayed) Labs Reviewed  COMPREHENSIVE METABOLIC PANEL - Abnormal; Notable for the following components:  Result Value   Potassium 3.1 (*)    Glucose, Bld 135 (*)    Total Protein 8.5 (*)    AST 45 (*)    ALT 78 (*)    All other components within normal limits  CBC WITH DIFFERENTIAL/PLATELET - Abnormal; Notable for the following components:   RBC 3.65 (*)    MCV 102.2 (*)    RDW 16.8 (*)    All other components within normal limits  URINALYSIS, ROUTINE W REFLEX MICROSCOPIC    EKG None  Radiology CT Head Wo Contrast  Result Date: 11/14/2021 CLINICAL DATA:  Neuro deficit, acute, stroke suspected Leg weakness and numbness for 2 weeks.  Falls. EXAM: CT HEAD WITHOUT CONTRAST TECHNIQUE: Contiguous axial images were obtained from the base of the skull through the vertex without intravenous contrast. RADIATION DOSE REDUCTION: This exam was performed according to the departmental dose-optimization program which includes automated exposure control, adjustment of the mA and/or kV according to patient size and/or use of iterative reconstruction technique. COMPARISON:  Brain MRI 07/10/2021 FINDINGS: Brain: No acute intracranial hemorrhage. Again seen remote lacunar infarcts in the left basal ganglia and caudate. There is minimal vague low-density involving the superior aspect of both cerebellar hemispheres, leptomeningeal disease suspected in this region on prior brain MRI. No evidence of territorial ischemia. No subdural or extra-axial collection. There is no midline shift or evident mass effect. No hydrocephalus. Vascular: No hyperdense vessel or unexpected  calcification. Skull: No fracture or focal lesion. Sinuses/Orbits: Paranasal sinuses and mastoid air cells are clear. The visualized orbits are unremarkable. Other: None. IMPRESSION: 1. No acute intracranial abnormality. 2. Remote lacunar infarcts in the left basal ganglia and caudate. 3. Minimal vague low-density involving the superior aspect of both cerebellar hemispheres, leptomeningeal disease suspected in this region on prior brain MRI. Direct comparison is difficult due to differences in modality. Electronically Signed   By: Keith Rake M.D.   On: 11/14/2021 17:20   MR Brain Wo Contrast (neuro protocol)  Result Date: 11/14/2021 CLINICAL DATA:  Provided history: Neuro deficit, acute, stroke suspected. EXAM: MRI HEAD WITHOUT CONTRAST TECHNIQUE: Multiplanar, multiecho pulse sequences of the brain and surrounding structures were obtained without intravenous contrast. COMPARISON:  Head CT 11/14/2021.  Brain MRI 10/26/2021. FINDINGS: Brain: Cerebral volume is normal. Redemonstrated chronic small-vessel infarct within the left corona radiata and basal ganglia. Similar to the recent prior brain MRI of 10/26/2021, there is T2 FLAIR hyperintense signal abnormality within the left greater than right cerebellar hemispheres. Given the findings on the recent prior brain MRI, this may reflect edema related to leptomeningeal metastatic disease and/or gliosis. There is no acute infarct. No chronic intracranial blood products. No extra-axial fluid collection. No midline shift. Vascular: Maintained flow voids within the proximal large arterial vessels. Skull and upper cervical spine: No focal suspicious marrow lesion. Sinuses/Orbits: Visualized orbits show no acute finding. Mild mucosal thickening within the right maxillary sinus. Other: Left mastoid effusion. Trace fluid also present within the right mastoid air cells. IMPRESSION: No evidence of acute infarct. Redemonstrated chronic small-vessel infarct within the left  corona radiata and basal ganglia. Similar to the recent prior brain MRI of 10/26/2021, there is T2 FLAIR hyperintense signal abnormality within the left greater than right cerebellar hemispheres. Given the findings on the recent prior contrast-enhanced MRI, this may reflect edema related to leptomeningeal metastatic disease and/or gliosis. Mild mucosal thickening within the right maxillary sinus. Left mastoid effusion. Trace fluid also present within the right mastoid air cells. Electronically Signed  By: Kellie Simmering D.O.   On: 11/14/2021 19:50    Procedures Procedures    Medications Ordered in ED Medications - No data to display  ED Course/ Medical Decision Making/ A&P                           Medical Decision Making Amount and/or Complexity of Data Reviewed Labs: ordered. Radiology: ordered.  Risk Prescription drug management. Decision regarding hospitalization.   Head CT without any acute findings.  Evidence of remote infarcts.  We will go ahead and proceed to MRI.  Complete metabolic panel significant for potassium of 3.1.  Liver function test without significant abnormalities mild elevation of AST ALT.  CBC no leukocytosis hemoglobin good at 12.1.  Platelets good at 206.  MRI brain shows no acute intracranial abnormality.  Redemonstrated chronic small vessel infarct in the left corona radiata and basal ganglia.  Similar to the recent brain MRI February 12 there is T2 flair hyperintense signal abnormality within the left greater than right cerebellar hemispheres giving the findings on the recent prior contrast-enhanced MRI this may reflect edema related to leptomeningeal metastatic disease and/or gliosis.  There is also some mild mucosal thickening within the right maxillary sinus and the left mastoid effusion trace fluid also present in the right mastoid air cells.  Based on these findings which could explain her difficulty with walking and bilateral lower extremity weakness or at  least discoordination.  We will discussed with Dr. Marin Olp who is on-call for hematology oncology for guidance on whether patient warrants admission or whether this can be followed up as an outpatient.  Dr. Marin Olp feels patient needs to be admitted.  Michela Pitcher this could be a side effect to her chemotherapy.  Also could be what was seen on the MRI of the brain.  He is recommending neurology consult I placed a call to them and hospitalist admission.  Oncology will follow.  For the low potassium will supplement with IV potassium since patient does not have much appetite.  CRITICAL CARE Performed by: Fredia Sorrow Total critical care time: 35 minutes Critical care time was exclusive of separately billable procedures and treating other patients. Critical care was necessary to treat or prevent imminent or life-threatening deterioration. Critical care was time spent personally by me on the following activities: development of treatment plan with patient and/or surrogate as well as nursing, discussions with consultants, evaluation of patient's response to treatment, examination of patient, obtaining history from patient or surrogate, ordering and performing treatments and interventions, ordering and review of laboratory studies, ordering and review of radiographic studies, pulse oximetry and re-evaluation of patient's condition.  Final Clinical Impression(s) / ED Diagnoses Final diagnoses:  Hypokalemia  Metastatic breast cancer [facility])    Rx / DC Orders ED Discharge Orders     None         Fredia Sorrow, MD 11/14/21 2030    Fredia Sorrow, MD 11/14/21 2034

## 2021-11-14 NOTE — Hospital Course (Signed)
Erin [name] is a deaf 61 y.o. 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 ?

## 2021-11-14 NOTE — Telephone Encounter (Signed)
Telephone encounter note used to document fall with visit.   ?Front desk noticed pt fall out in the parking lot so front desk Cliffton Asters and this PT ran outside to assist.  Pt was seated on the rollator walker going up the ramp from the parking lot to the sidewalk. The RW was folded in half and pt was seated on top. Mother was already trying to help her up when we arrived.  Mod-MaxA x 3 to stand and then seated on RW and pt was brought into the clinic to assess for injury with interpreter present for sign language.  Pt reports only injury is mild abrasion to bil elbows with no bleeding noted.  Denies hitting head or any increased head, back or joint pain throughout seated time in clinic around 52mnutes total.  MD was notified via inbox message and pt and mother were instructed to let MD know of any increased functional or bone pain due to extensive bone met status.  Pt was helped out to car with use of gait belt and WC with mod-max assist into the passenger seat for travel home.  Mom will have grandson present to help pt get the 3 flights of stairs back up to her apartment.  Home health service and PT were suggested to Dr. GLindi Adieand this will be much more appropriate for pt due to increasing frequency of falls - This was fall #3 for the day.   ?

## 2021-11-14 NOTE — ED Notes (Signed)
Pt to MRI

## 2021-11-14 NOTE — Telephone Encounter (Signed)
Received call from pt mother stating pt has fallen 3 times today and is unable to walk or fully move her legs.  States pt did not hit her head but does have a few bruises from the fall.  Per Wilber Bihari, NP pt family needing to call EMS to transport pt to ED for further evaluation and treatment.  Pt mother verbalized understanding.  ?

## 2021-11-14 NOTE — H&P (Signed)
History and Physical    MARRIETTA THUNDER ALP:[id number] DOB: 09/04/1961 DOA: 11/14/2021  PCP: [name], NP  Patient coming from: Home via EMS  I have personally briefly reviewed patient's old medical records in [place]  Chief Complaint: Progressive lower extremity weakness, frequent falls  HPI: Erin [name] is a deaf 61 y.o. 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  Remote video ASL interpreter is used to facilitate communication.  Patient reports of progressive bilateral lower extremity weakness and numbness over the last 2 or 3 weeks.  She has been having difficulty ambulating and is falling frequently.  She fell 3 times earlier today.  She hit the back of her head and bruised her elbows but did not lose consciousness or suffer any significant injury.  She says she is having some difficulty with urination and bowel movements but denies any loss of control of her bowel or bladder.  She denies any back pain, chest pain, dyspnea, abdominal pain, headache, change in vision.  She has occasional nausea and vomiting.  She has a history of radiation induced esophagitis and recent gastritis which has caused her to be fearful of eating.  The symptoms are improving after she was recently started on omeprazole, clarithromycin, and metronidazole for possible H. pylori gastritis.  Patient was initially found to have findings of cerebellar leptomeningeal spread on MRI brain 07/10/2021.  LP was also highly suspicious for leptomeningeal carcinomatosis.  She did undergo whole brain SBRT which was completed 08/25/2021.  Repeat MRI  brain 10/26/2021 showed substantial improvement of cerebellar leptomeningeal metastatic disease.  ED Course   Labs/Imaging on admission: I have personally reviewed following labs and imaging studies.  Initial vitals showed BP 112/79, pulse 100, RR 16, temp 98.6 F, SPO2 100% on room air.  Labs showed sodium 136, potassium 3.1, bicarb 24, BUN 19, creatinine 0.69, serum glucose 135, AST 45, ALT 78, alk phos 125, total bilirubin 0.4, WBC 9.7, hemoglobin 12.1, platelets 206,000.  Respiratory panel ordered and pending collection.  CT head without contrast negative for acute intracranial abnormality.  Remote lacunar infarcts in the left basal ganglia and caudate seen.  Changes suspicious for leptomeningeal disease involving the superior aspect of both cerebellar hemisphere seen.  MRI brain without contrast was negative for evidence of acute infarct.  Chronic small vessel infarct within the left corona radiata and basal ganglia is seen.  Changes in the left greater than right cerebellar hemispheres concerning for leptomeningeal metastatic disease was seen.  EDP discussed with on-call oncology, Dr. Marin Olp, who recommended medical admission and neurology consultation.  EDP discussed with on-call neurology who will follow.  The hospitalist service was consulted to admit for further evaluation and management.  Review of Systems: All systems reviewed and are negative except as documented in history of present illness above.   Past Medical History:  Diagnosis Date   Arthritis    knees, elbows   Borderline glaucoma of right eye    BRCA2 gene mutation positive 02/17/2018   BRCA2 c.4552del (p.Glu1518Asnfs*25)  Result reported out on 02/15/2018.    Breast cancer ([place]) 05/2017   left   Chronic back pain    Deaf    per pt born hearing and at age  2 1/2 lost hearing , was told by mother unknown cause, can miminally hear in left and no hearing on right    Depression    Elevated cancer antigen 125 (CA 125)     Family history of breast cancer    Family history of breast cancer    Family history of colon cancer    Genetic testing 02/17/2018   The Common Hereditary Cancer Panel offered by [facility] includes sequencing and/or deletion duplication testing of the following 47 genes: APC, ATM, AXIN2, BARD1, BMPR1A, BRCA1, BRCA2, BRIP1, CDH1, CDKN2A (p14ARF), CDKN2A (p16INK4a), CKD4, CHEK2, CTNNA1, DICER1, EPCAM (Deletion/duplication testing only), GREM1 (promoter region deletion/duplication testing only), KIT, MEN1, MLH1, MSH2, MSH3, MSH6, MU   GERD (gastroesophageal reflux disease)    History of cancer chemotherapy    left breast cancer 09-08-2017 to 10-09-2017;  fallopion tube cancer  12-15-2017  to 06-28-2018   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 oncologist-  dr [name]   11-04-2017  s/p  LAVH w/ BSO,  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;  completed chemo 06-28-2018   History of external beam radiation therapy    left breast  11-25-2017  to 01-07-2018   Hyperlipidemia    Hypertension    followed by pcp   (10-05-2019  per pt never had stress test)   IDA (iron deficiency anemia)    Malignant neoplasm of lower-outer quadrant of left breast of female, estrogen receptor negative [facility]) oncologist-- dr [name]   dx 08/ 2018--- Stage IA, DCIS,  ER/ PR negative,  HER-2 positive;  06-09-2017 s/p left breast lumpectomy with node dissection;  completed chemo 10-09-2017  and radiation 01-07-2018/  hercepton completed 06-28-2018   Non-insulin dependent type 2 diabetes mellitus ([place])    followed by pcp   (10-05-2019 per pt check cbg every other day in AM,  fasting cbg-- 105)   Numbness of right thumb    Wears glasses     Past Surgical History:  Procedure Laterality Date   BREAST LUMPECTOMY WITH RADIOACTIVE SEED AND SENTINEL LYMPH NODE BIOPSY Left 06/09/2017   Procedure: BREAST LUMPECTOMY WITH RADIOACTIVE SEED AND SENTINEL LYMPH NODE BIOPSY;  Surgeon: Excell Seltzer, MD;  Location: [facility];  Service: General;  Laterality: Left;   COLONOSCOPY  11/02/2017   polyps   HERNIA REPAIR     HIATAL HERNIA REPAIR  09/2019   IR RADIOLOGIST EVAL & MGMT  07/09/2021   IR RADIOLOGIST EVAL & MGMT  07/24/2021   IR RADIOLOGIST EVAL & MGMT  09/30/2021   LAPAROSCOPIC ASSISTED VAGINAL HYSTERECTOMY N/A 11/04/2017   Procedure: LAPAROSCOPIC ASSISTED VAGINAL HYSTERECTOMY;  Surgeon: [name], MD;  Location: [facility];  Service: Gynecology;  Laterality: N/A;   LAPAROSCOPIC BILATERAL SALPINGO OOPHERECTOMY Bilateral 11/04/2017   Procedure: LAPAROSCOPIC BILATERAL SALPINGO OOPHORECTOMY;  Surgeon: [name], MD;  Location: [facility];  Service: Gynecology;  Laterality: Bilateral;   LAPAROSCOPY N/A 10/09/2019   Procedure: LAPAROSCOPY DIAGNOSTIC WITH PERITONEAL BIOPSIES;  Surgeon: [name], MD;  Location: [facility];  Service: Gynecology;  Laterality: N/A;   PORTACATH PLACEMENT Right 06/09/2017   Procedure: INSERTION PORT-A-CATH WITH [place];  Surgeon: Excell Seltzer, MD;  Location: [facility];  Service: General;  Laterality: Right;   PORTACATH PLACEMENT Right 11/13/2019   Procedure: INSERTION PORT-A-CATH WITH ULTRASOUND GUIDANCE;  Surgeon: Rolm Bookbinder, MD;  Location: [facility];  Service: General;  Laterality: Right;   RADIOLOGY WITH ANESTHESIA N/A  08/27/2021   Procedure: IR WITH ANESTHESIA MICROWAVE ABLATION OF LIVER;  Surgeon: Aletta Edouard, MD;  Location: [facility];  Service: Radiology;  Laterality: N/A;   TUBAL LIGATION  02/02/2002   _0    PPTL   UPPER GI ENDOSCOPY      Social History:  reports that she has never smoked. She has never used smokeless tobacco. She reports that she does not currently use alcohol. She reports that she does not use drugs.  No Known Allergies  Family History  Problem Relation Age of Onset   Hypertension Mother    Lupus Mother    Diabetes Father    Hypertension Sister     Diabetes Paternal Grandmother    Colon cancer Paternal Grandmother 90   CAD Brother    Diabetes Brother    Heart attack Brother    Breast cancer Maternal Aunt        dx >50   Heart attack Paternal Grandfather    Breast cancer Maternal Aunt        dx under 17   Breast cancer Maternal Aunt        dx  under 50     Prior to Admission medications   Medication Sig Start Date End Date Taking? Authorizing Provider  Accu-Chek FastClix Lancets MISC Use as instructed. Inject into the skin twice daily 08/05/21   [name], NP  atorvastatin (LIPITOR) 40 MG tablet TAKE 1 TABLET BY MOUTH EVERY DAY 09/29/21   Dorna Mai, MD  Blood Glucose Calibration (ACCU-CHEK GUIDE CONTROL) LIQD 1 each by In Vitro route once as needed for up to 1 dose. 12/26/18   [name], NP  CINNAMON PO Take 2,400 mg by mouth every evening. 1200 mg per cap    [provider]  clarithromycin (BIAXIN) 500 MG tablet Take 1 tablet (500 mg total) by mouth 2 (two) times daily. For 14 days 11/10/21   [name], MD  diclofenac Sodium (VOLTAREN) 1 % GEL Apply 1 application topically daily as needed (pain).    [provider]  famotidine (PEPCID) 40 MG tablet Take 1 tablet (40 mg total) by mouth daily. 10/06/21 01/04/22  [name], NP  glucose blood (ACCU-CHEK GUIDE) test strip Use as instructed. Check blood glucose by fingerstick twice per day. 11/18/20   [name], NP  lidocaine-prilocaine (EMLA) cream Apply to affected area once Patient taking differently: Apply 1 application topically daily as needed. Apply to affected area once 07/30/21   [name], MD  loperamide (IMODIUM) 2 MG capsule Take 2 mg by mouth as needed for diarrhea or loose stools.    [name], MD  memantine (NAMENDA) 10 MG tablet Take 1 tablet (10 mg total) by mouth 2 (two) times daily. 07/30/21   Hayden Pedro, PA-C  metroNIDAZOLE (FLAGYL) 500 MG tablet Take 1 tablet (500 mg total) by mouth 3 (three)  times daily. 11/10/21   [name], MD  Multiple Vitamins-Minerals (MULTIVITAMIN ADULT) CHEW Chew 2 each by mouth daily.    [provider]  omeprazole (PRILOSEC) 40 MG capsule Take 1 capsule (40 mg total) by mouth daily. 11/11/21   [name], NP  ondansetron (ZOFRAN-ODT) 8 MG disintegrating tablet Dissolve 1 tablet (8 mg total) by mouth every 8 (eight) hours as needed for nausea or vomiting. 11/05/21   [name], MD  promethazine (PHENERGAN) 25 MG suppository Unwrap and insert 1 suppository rectally every 6 hours as needed for nausea or vomiting. 11/05/21  [name], MD  tucatinib (TUKYSA) 150 MG tablet Take 2 tablets (300 mg total) by mouth 2 (two) times daily. Take every 12 hrs at the same time each day with or without a meal. 07/30/21   [name], MD    Physical Exam: Vitals:   11/14/21 1617 11/14/21 1622 11/14/21 2012 11/14/21 2015  BP: 112/79  108/65 112/72  Pulse: 100  91 94  Resp: 16  16   Temp: 98.6 F (37 C)  99 F (37.2 C)   TempSrc: Oral  Oral   SpO2: 100%  94% 100%  Weight:  54 kg    Height:  _0  (1.549 m)     Constitutional: Resting in bed with head elevated, NAD, calm, comfortable Eyes: PERRL, EOMI, lids and conjunctivae normal ENMT: Mucous membranes are moist. Posterior pharynx clear of any exudate or lesions.Normal dentition.  Neck: normal, supple, no masses. Respiratory: clear to auscultation bilaterally, no wheezing, no crackles. Normal respiratory effort. No accessory muscle use.  Cardiovascular: Regular rate and rhythm, no murmurs / rubs / gallops. No extremity edema. 2+ pedal pulses. Abdomen: no tenderness, no masses palpated. No hepatosplenomegaly. Bowel sounds positive.  Musculoskeletal: no clubbing / cyanosis. No joint deformity upper and lower extremities. Good ROM, no contractures. Normal muscle tone.  Skin: no rashes, lesions, ulcers. No induration Neurologic: CN 2-12 grossly intact. Sensation intact. Strength 5/5  bilateral upper extremities, 3/5 bilateral lower extremities. Psychiatric: Normal judgment and insight. Alert and oriented x 3. Normal mood.   EKG: Ordered and pending.  Assessment/Plan Principal Problem:   Leptomeningeal metastases (HCC) Active Problems:   Malignant neoplasm of lower-outer quadrant of left breast of female, estrogen receptor negative ([place])   Bone metastases ([place])   Lower extremity weakness   Gastritis   Erin [name] is a deaf 61 y.o. 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  Assessment and Plan: * Leptomeningeal metastases ([place]) Metastatic left-sided breast cancer Extensive osseous/spinal metastases Hepatic metastasis (s/p liver ablation 08/27/2021) S/p whole brain radiation (08/12/2021-08/25/2021) Bilateral lower extremity weakness Patient with progressive bilateral lower extremity weakness and numbness.  This could be related to cerebellar leptomeningeal metastatic disease, extensive metastatic spinal disease, and/or adverse effect of active chemotherapy. -Oncology recommended admission and will follow -EDP discussed with on-call neurology who will consult -MRI brain shows left >right cerebellar abnormality which may reflect edema related to leptomeningeal metastatic disease and/or gliosis -Consider repeat imaging of whole spine -PT/OT eval  Gastritis Recent gastritis symptoms with H. pylori serology suggestive of possible H. pylori gastritis. -Continue PPI, clarithromycin, Flagyl  DVT prophylaxis: enoxaparin (LOVENOX) injection 40 mg Start:  11/15/21 2200 Code Status: Full code, confirmed with patient on admission Family Communication: Mother at bedside Disposition Plan: From home, dispo pending clinical progress Consults called: Oncology, neurology Severity of Illness: The appropriate patient status for this patient is OBSERVATION. Observation status is judged to be reasonable and necessary in order to provide the required intensity of service to ensure the patient's safety. The patient's presenting symptoms, physical exam findings, and initial radiographic and laboratory data in the context of their medical condition is felt to place them at decreased risk for further clinical deterioration. Furthermore, it is anticipated that the  patient will be medically stable for discharge from the hospital within 2 midnights of admission.   Zada Finders MD [facility]  If 7PM-7AM, please contact night-coverage [email or web address]  11/14/2021, 11:09 PM

## 2021-11-14 NOTE — ED Notes (Signed)
Pt asked to work to try to provide urine sample. She agreed.  ?

## 2021-11-14 NOTE — Assessment & Plan Note (Signed)
Recent gastritis symptoms with H. pylori serology suggestive of possible H. pylori gastritis. ?-Continue PPI, clarithromycin, Flagyl ?

## 2021-11-15 ENCOUNTER — Observation Stay (HOSPITAL_COMMUNITY): Payer: Medicare Other

## 2021-11-15 DIAGNOSIS — C7949 Secondary malignant neoplasm of other parts of nervous system: Secondary | ICD-10-CM | POA: Diagnosis not present

## 2021-11-15 LAB — CBC
HCT: 32.5 % — ABNORMAL LOW (ref 36.0–46.0)
Hemoglobin: 10.6 g/dL — ABNORMAL LOW (ref 12.0–15.0)
MCH: 33.3 pg (ref 26.0–34.0)
MCHC: 32.6 g/dL (ref 30.0–36.0)
MCV: 102.2 fL — ABNORMAL HIGH (ref 80.0–100.0)
Platelets: 195 10*3/uL (ref 150–400)
RBC: 3.18 MIL/uL — ABNORMAL LOW (ref 3.87–5.11)
RDW: 17.1 % — ABNORMAL HIGH (ref 11.5–15.5)
WBC: 7.7 10*3/uL (ref 4.0–10.5)
nRBC: 0 % (ref 0.0–0.2)

## 2021-11-15 LAB — COMPREHENSIVE METABOLIC PANEL
ALT: 56 U/L — ABNORMAL HIGH (ref 0–44)
AST: 43 U/L — ABNORMAL HIGH (ref 15–41)
Albumin: 3.4 g/dL — ABNORMAL LOW (ref 3.5–5.0)
Alkaline Phosphatase: 98 U/L (ref 38–126)
Anion gap: 6 (ref 5–15)
BUN: 15 mg/dL (ref 6–20)
CO2: 24 mmol/L (ref 22–32)
Calcium: 8.2 mg/dL — ABNORMAL LOW (ref 8.9–10.3)
Chloride: 105 mmol/L (ref 98–111)
Creatinine, Ser: 0.68 mg/dL (ref 0.44–1.00)
GFR, Estimated: >60 mL/min (ref >60)
Glucose, Bld: 112 mg/dL — ABNORMAL HIGH (ref 70–99)
Potassium: 4.6 mmol/L (ref 3.5–5.1)
Sodium: 135 mmol/L (ref 135–145)
Total Bilirubin: 1.4 mg/dL — ABNORMAL HIGH (ref 0.3–1.2)
Total Protein: 7.1 g/dL (ref 6.5–8.1)

## 2021-11-15 LAB — MAGNESIUM: Magnesium: 2.3 mg/dL (ref 1.7–2.4)

## 2021-11-15 LAB — VITAMIN B12: Vitamin B-12: 6159 pg/mL — ABNORMAL HIGH (ref 180–914)

## 2021-11-15 LAB — HIV ANTIBODY (ROUTINE TESTING W REFLEX): HIV Screen 4th Generation wRfx: NONREACTIVE

## 2021-11-15 LAB — FOLATE: Folate: 10.7 ng/mL (ref >5.9)

## 2021-11-15 LAB — VITAMIN D 25 HYDROXY (VIT D DEFICIENCY, FRACTURES): Vit D, 25-Hydroxy: 34.72 ng/mL (ref 30–100)

## 2021-11-15 LAB — TSH: TSH: 2.559 u[IU]/mL (ref 0.350–4.500)

## 2021-11-15 MED ORDER — GABAPENTIN 100 MG PO CAPS
100.0000 mg | ORAL_CAPSULE | Freq: Three times a day (TID) | ORAL | Status: DC
  Administered 2021-11-15 – 2021-11-25 (×30): 100 mg via ORAL
  Filled 2021-11-15 (×31): qty 1

## 2021-11-15 MED ORDER — LIDOCAINE-PRILOCAINE 2.5-2.5 % EX CREA
TOPICAL_CREAM | CUTANEOUS | Status: DC | PRN
  Filled 2021-11-15: qty 5

## 2021-11-15 MED ORDER — METHOCARBAMOL 1000 MG/10ML IJ SOLN
500.0000 mg | Freq: Once | INTRAVENOUS | Status: AC
  Administered 2021-11-15: 500 mg via INTRAVENOUS
  Filled 2021-11-15: qty 500

## 2021-11-15 MED ORDER — FAMOTIDINE 20 MG PO TABS
20.0000 mg | ORAL_TABLET | Freq: Two times a day (BID) | ORAL | Status: DC
  Administered 2021-11-15 – 2021-11-25 (×21): 20 mg via ORAL
  Filled 2021-11-15 (×22): qty 1

## 2021-11-15 MED ORDER — POLYETHYLENE GLYCOL 3350 17 G PO PACK
17.0000 g | PACK | Freq: Two times a day (BID) | ORAL | Status: DC | PRN
  Administered 2021-11-16: 17 g via ORAL
  Filled 2021-11-15: qty 1

## 2021-11-15 MED ORDER — GADOBUTROL 1 MMOL/ML IV SOLN
5.0000 mL | Freq: Once | INTRAVENOUS | Status: AC | PRN
  Administered 2021-11-15: 5 mL via INTRAVENOUS

## 2021-11-15 NOTE — Consult Note (Addendum)
NEUROLOGY CONSULTATION NOTE   Date of service: November 15, 2021 Patient Name: Erin [name] MRN:  [id number] DOB:  07-17-1961 Reason for consult: "BL lower ext weakness" Requesting Provider: Shelda Pal,* _ _ _   _ __   _ __ _ _  __ __   _ __   __ _  History of Present Illness  Erin [name] is a 61 y.o. 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 They have presented with a chief complaint of progressive bilateral lower extremity weakness and numbness which has been gradually progressive since Oct 2022 and has acutely worsened over the last 2 weeks and now unable to ambulate. We were asked to evaluate.    Patient and mother at bedside. Patient is deaf since birth and mother says she signs and reads lips pretty well. According to patient and mother patient has been losing her balance as if legs were "wobbly and weak". She had an MRI in October due to this and Leptomeningeal metastases were found. She started radiation treatment and developed nausea and vomiting and loss of appetite. Since the new year she has been weaker since not eating. Patient says she is scared to eat/no appetite due to nausea and retching after she eats. No Megace has been offered to patient by Hem/Onc or primary and this could help with her appetite.     Per patient and mom, she was walking with a "wide base and wobbly". Now she has become so weak she cannot lift her feet and drags her legs to walk with assistance now. It is of note she fell at PT yesterday and she was changed from outpatient o home health PT due to weakness and falling. Denies recent viral/ bacterial illness, denies new medications and last vaccine was flu injection 07/14/21.    ROS    Constitutional + weight loss, no fever and chills.  HEENT Denies changes in vision and hearing.  Respiratory Denies SOB and cough.  CV Denies palpitations and CP  GI Denies abdominal pain, endorses nausea, no vomiting or diarrhea.  GU Some difficutly with urinating but unable to describe well. Feels liks she cant hold it in for a resonable amount of time. No dysuria.  MSK Denies myalgia, endorses pain and paresthesias in BL feet.  Skin Denies rash and pruritus.  Neurological Denies headache and syncope.  Psychiatric Denies recent changes in mood. Denies anxiety and depression.   Past History   Past Medical History:  Diagnosis Date   Arthritis    knees, elbows   Borderline glaucoma of right eye    BRCA2 gene mutation positive 02/17/2018   BRCA2 c.4552del (p.Glu1518Asnfs*25)  Result reported out on 02/15/2018.    Breast cancer ([place]) 05/2017   left   Chronic back pain    Deaf    per pt born hearing and at age 67 1/2 lost hearing , was told by mother unknown cause, can miminally hear in left and no hearing on right    Depression    Elevated cancer antigen 125 (CA 125)    Family history of breast cancer    Family history of breast cancer    Family history of colon cancer    Genetic testing 02/17/2018   The Common Hereditary Cancer Panel offered by [facility] includes sequencing and/or deletion duplication testing  of the following 47 genes: APC, ATM, AXIN2, BARD1, BMPR1A, BRCA1, BRCA2, BRIP1, CDH1, CDKN2A (p14ARF), CDKN2A (p16INK4a), CKD4, CHEK2, CTNNA1, DICER1, EPCAM (Deletion/duplication testing only), GREM1 (promoter region deletion/duplication testing only), KIT, MEN1, MLH1, MSH2, MSH3, MSH6, MU   GERD (gastroesophageal reflux disease)    History of cancer chemotherapy    left breast cancer 09-08-2017 to 10-09-2017;  fallopion tube cancer  12-15-2017  to 06-28-2018   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 oncologist-  dr [name]   11-04-2017  s/p  LAVH w/ BSO,  dx right  fallopian tube carinoma (Stage 1C) in setting Stage 1 breast cancer;  completed chemo 06-28-2018   History of external beam radiation therapy    left breast  11-25-2017  to 01-07-2018   Hyperlipidemia    Hypertension    followed by pcp   (10-05-2019  per pt never had stress test)   IDA (iron deficiency anemia)    Malignant neoplasm of lower-outer quadrant of left breast of female, estrogen receptor negative [facility]) oncologist-- dr [name]   dx 08/ 2018--- Stage IA, DCIS,  ER/ PR negative,  HER-2 positive;  06-09-2017 s/p left breast lumpectomy with node dissection;  completed chemo 10-09-2017  and radiation 01-07-2018/  hercepton completed 06-28-2018   Non-insulin dependent type 2 diabetes mellitus ([place])    followed by pcp   (10-05-2019 per pt check cbg every other day in AM,  fasting cbg-- 105)   Numbness of right thumb    Wears glasses    Past Surgical History:  Procedure Laterality Date   BREAST LUMPECTOMY WITH RADIOACTIVE SEED AND SENTINEL LYMPH NODE BIOPSY Left 06/09/2017   Procedure: BREAST LUMPECTOMY WITH RADIOACTIVE SEED AND SENTINEL LYMPH NODE BIOPSY;  Surgeon: Excell Seltzer, MD;  Location: [place];  Service: General;  Laterality: Left;   COLONOSCOPY  11/02/2017   polyps   HERNIA REPAIR     HIATAL HERNIA REPAIR  09/2019   IR RADIOLOGIST EVAL & MGMT  07/09/2021   IR RADIOLOGIST EVAL & MGMT  07/24/2021   IR RADIOLOGIST EVAL & MGMT  09/30/2021   LAPAROSCOPIC ASSISTED VAGINAL HYSTERECTOMY N/A 11/04/2017   Procedure: LAPAROSCOPIC ASSISTED VAGINAL HYSTERECTOMY;  Surgeon: [name], MD;  Location: [facility];  Service: Gynecology;  Laterality: N/A;   LAPAROSCOPIC BILATERAL SALPINGO OOPHERECTOMY Bilateral 11/04/2017   Procedure: LAPAROSCOPIC BILATERAL SALPINGO OOPHORECTOMY;  Surgeon: [name], MD;  Location: [facility];  Service: Gynecology;  Laterality: Bilateral;   LAPAROSCOPY N/A 10/09/2019   Procedure: LAPAROSCOPY DIAGNOSTIC WITH PERITONEAL BIOPSIES;  Surgeon:  [name], MD;  Location: [facility];  Service: Gynecology;  Laterality: N/A;   PORTACATH PLACEMENT Right 06/09/2017   Procedure: INSERTION PORT-A-CATH WITH [place];  Surgeon: Excell Seltzer, MD;  Location: [facility];  Service: General;  Laterality: Right;   PORTACATH PLACEMENT Right 11/13/2019   Procedure: INSERTION PORT-A-CATH WITH ULTRASOUND GUIDANCE;  Surgeon: Rolm Bookbinder, MD;  Location: [place];  Service: General;  Laterality: Right;   RADIOLOGY WITH ANESTHESIA N/A 08/27/2021   Procedure: IR WITH ANESTHESIA MICROWAVE ABLATION OF LIVER;  Surgeon: Aletta Edouard, MD;  Location: [facility];  Service: Radiology;  Laterality: N/A;   TUBAL LIGATION  02/02/2002   @[facility]    PP[facility]   UPPER GI ENDOSCOPY     Family History  Problem Relation Age of Onset   Hypertension Mother    Lupus Mother    Diabetes Father    Hypertension Sister    Diabetes Paternal Grandmother  Colon cancer Paternal Grandmother 28   CAD Brother    Diabetes Brother    Heart attack Brother    Breast cancer Maternal Aunt        dx >50   Heart attack Paternal Grandfather    Breast cancer Maternal Aunt        dx under 55   Breast cancer Maternal Aunt        dx  under 11   Social History   Socioeconomic History   Marital status: Legally Separated    Spouse name: Not on file   Number of children: 2   Years of education: Not on file   Highest education level: Not on file  Occupational History   Not on file  Tobacco Use   Smoking status: Never   Smokeless tobacco: Never  Vaping Use   Vaping Use: Never used  Substance and Sexual Activity   Alcohol use: Not Currently    Comment: very rare   Drug use: Never   Sexual activity: Not Currently    Birth control/protection: Surgical  Other Topics Concern   Not on file  Social History Narrative   1 boy and 1 girl   Resides in [place]   Seperated   Social Determinants of Health   Financial Resource Strain: Not  on file  Food Insecurity: Not on file  Transportation Needs: Not on file  Physical Activity: Not on file  Stress: Not on file  Social Connections: Not on file   No Known Allergies  Medications   Medications Prior to Admission  Medication Sig Dispense Refill Last Dose   atorvastatin (LIPITOR) 40 MG tablet TAKE 1 TABLET BY MOUTH EVERY DAY (Patient taking differently: Take 40 mg by mouth daily.) 30 tablet 0 11/14/2021   CINNAMON PO Take 2,400 mg by mouth every evening. 1200 mg per cap   11/13/2021   clarithromycin (BIAXIN) 500 MG tablet Take 1 tablet (500 mg total) by mouth 2 (two) times daily. For 14 days 28 tablet 0 11/14/2021   diclofenac Sodium (VOLTAREN) 1 % GEL Apply 1 application topically daily as needed (pain).   Unknown   famotidine (PEPCID) 40 MG tablet Take 1 tablet (40 mg total) by mouth daily. 90 tablet 1 11/14/2021   lidocaine-prilocaine (EMLA) cream Apply to affected area once (Patient taking differently: Apply 1 application topically daily as needed. Apply to affected area once) 30 g 3 Unknown   loperamide (IMODIUM) 2 MG capsule Take 2 mg by mouth as needed for diarrhea or loose stools.   Unknown   memantine (NAMENDA) 10 MG tablet Take 1 tablet (10 mg total) by mouth 2 (two) times daily. 60 tablet 4 11/14/2021   metroNIDAZOLE (FLAGYL) 500 MG tablet Take 1 tablet (500 mg total) by mouth 3 (three) times daily. 42 tablet 0 11/14/2021   Multiple Vitamins-Minerals (MULTIVITAMIN ADULT) CHEW Chew 2 each by mouth daily.   11/14/2021   omeprazole (PRILOSEC) 40 MG capsule Take 1 capsule (40 mg total) by mouth daily. 30 capsule 1 11/14/2021   ondansetron (ZOFRAN-ODT) 8 MG disintegrating tablet Dissolve 1 tablet (8 mg total) by mouth every 8 (eight) hours as needed for nausea or vomiting. 20 tablet 0 Unknown   promethazine (PHENERGAN) 25 MG suppository Unwrap and insert 1 suppository rectally every 6 hours as needed for nausea or vomiting. 30 each 0 Unknown   tucatinib (TUKYSA) 150 MG tablet Take 2  tablets (300 mg total) by mouth 2 (two) times daily. Take every 12 hrs at  the same time each day with or without a meal. 120 tablet 6 Past Month   Accu-Chek FastClix Lancets MISC Use as instructed. Inject into the skin twice daily 100 each 3    Blood Glucose Calibration (ACCU-CHEK GUIDE CONTROL) LIQD 1 each by In Vitro route once as needed for up to 1 dose. 1 each 0    glucose blood (ACCU-CHEK GUIDE) test strip Use as instructed. Check blood glucose by fingerstick twice per day. 100 each 12      Vitals   Vitals:   11/15/21 0515 11/15/21 0655 11/15/21 0842 11/15/21 1431  BP: 98/65 115/75 109/66 115/69  Pulse: 80 79 78 86  Resp:   19 16  Temp:   98 F (36.7 C) 99 F (37.2 C)  TempSrc:   Oral Oral  SpO2: 100% 99% 100% 99%  Weight:      Height:         Body mass index is 22.48 kg/m.  Physical Exam   General: Laying comfortably in bed; in no acute distress.  HENT: Normal oropharynx and mucosa. Normal external appearance of ears and nose.  Neck: Supple, no pain or tenderness  CV: No JVD. No peripheral edema.  Pulmonary: Symmetric Chest rise. Normal respiratory effort.  Abdomen: Soft to touch, non-tender.  Ext: No cyanosis, edema, or deformity  Skin: No rash. Normal palpation of skin.   Musculoskeletal: Normal digits and nails by inspection. No clubbing.   Neurologic Examination  Mental status/Cognition: Alert, oriented to self, place, month and year, good attention.  Speech/language: Fluent, comprehension intact, object naming intact, repetition intact.  Cranial nerves:   CN II Pupils equal and reactive to light, no VF deficits    CN III,IV,VI EOM intact, no gaze preference or deviation, no nystagmus    CN V normal sensation in V1, V2, and V3 segments bilaterally    CN VII no asymmetry, no nasolabial fold flattening    CN VIII normal hearing to speech    CN IX & X normal palatal elevation, no uvular deviation    CN XI 5/5 head turn and 5/5 shoulder shrug bilaterally    CN  XII midline tongue protrusion    Motor:  Muscle bulk: poor, tone normal. Mvmt Root Nerve  Muscle Right Left Comments  SA C5/6 Ax Deltoid 5 5   EF C5/6 Mc Biceps 5 5   EE C6/7/8 Rad Triceps 5 5   WF C6/7 Med FCR     WE C7/8 PIN ECU     F Ab C8/T1 U ADM/FDI 5 5   HF L1/2/3 Fem Illopsoas 2 2   KE L2/3/4 Fem Quad 3 3   DF L4/5 D Peron Tib Ant 4 4   PF S1/2 Tibial Grc/Sol 4 4    Reflexes:  Right Left Comments  Pectoralis      Biceps (C5/6) 2 2   Brachioradialis (C5/6) 2 2    Triceps (C6/7) 2 2    Patellar (L3/4) 0 0    Achilles (S1) 0 0    Hoffman      Plantar down down   Jaw jerk    Sensation:  Light touch Decreased in BL lower legs and feet. Has paresthesias in BL feet.   Pin prick Intact throughout   Temperature    Vibration Mildly decreased in BL feet.  Proprioception    Coordination/Complex Motor:  - Finger to Nose intact BL - Heel to shin unable to do - Rapid alternating movement are normal BL -  Gait: unsafe to assess given significant BL lower ext weakness.  Labs   CBC:  Recent Labs  Lab 11/10/21 0937 11/14/21 1637 11/15/21 0530  WBC 5.8 9.7 7.7  NEUTROABS 4.5 7.7  --   HGB 10.1* 12.1 10.6*  HCT 30.6* 37.3 32.5*  MCV 99.0 102.2* 102.2*  PLT 175 206 294    Basic Metabolic Panel:  Lab Results  Component Value Date   NA 135 11/15/2021   K 4.6 11/15/2021   CO2 24 11/15/2021   GLUCOSE 112 (H) 11/15/2021   BUN 15 11/15/2021   CREATININE 0.68 11/15/2021   CALCIUM 8.2 (L) 11/15/2021   GFRNONAA >60 11/15/2021   GFRAA >60 06/14/2020   Lipid Panel:  Lab Results  Component Value Date   LDLCALC 50 04/04/2021   HgbA1c:  Lab Results  Component Value Date   HGBA1C 6.5 (H) 08/22/2021   Urine Drug Screen: No results found for: LABOPIA, COCAINSCRNUR, LABBENZ, AMPHETMU, THCU, LABBARB  Alcohol Level No results found for: [facility]  MRI Brain(Personally reviewed): IMPRESSION: No evidence of acute infarct.   Redemonstrated chronic small-vessel infarct  within the left corona radiata and basal ganglia.   Similar to the recent prior brain MRI of 10/26/2021, there is T2 FLAIR hyperintense signal abnormality within the left greater than right cerebellar hemispheres. Given the findings on the recent prior contrast-enhanced MRI, this may reflect edema related to leptomeningeal metastatic disease and/or gliosis.   Mild mucosal thickening within the right maxillary sinus.   Left mastoid effusion. Trace fluid also present within the right mastoid air cells.   MR C, T, L spine with and without contrast(Personally reviewed): MRI C spine: No definite leptomeningeal metastatic disease is identified within the cervical spinal canal. C4-C5, there is multifactorial severe spinal canal stenosis with spinal cord flattening. T2 hyperintense signal abnormality within the left aspect of the spinal cord compatible with myelomalacia and/or focal edema. C5-C6, there is multifactorial severe spinal canal stenosis with spinal cord flattening. T2 hyperintense signal abnormality within the left aspect of the spinal cord compatible with myelomalacia and/or focal edema.   MRI T spine: Extensive abnormal enhancement along the distal aspect of the spinal cord (extending inferiorly from the T9 level), progressed from the prior total spine MRI of 08/02/2021 and compatible with leptomeningeal metastatic disease.   Multifocal osseous metastatic disease within the thoracic spine is similar as compared to the prior MRI. Unchanged mild T3, T5 and T6 compression deformities.   Superimposed thoracic spondylosis. No significant spinal canal or foraminal stenosis.  MRI L spine: Extensive abnormal enhancement along the visualized distal spinal cord and cauda equina nerve roots, progressed from the recent prior MRI of 08/02/2021, and compatible with leptomeningeal metastatic disease.   Unchanged height loss at site of an L3 pathologic compression fracture. As  before, bony retropulsion and spondylosis at L2-L3 resulted in mild-to-moderate spinal canal narrowing, and mild bilateral neural foraminal narrowing.   Lumbar spondylosis is otherwise unchanged. No significant spinal canal stenosis, and no more than mild neural foraminal narrowing, at the remaining levels.   Multifocal osseous metastatic disease within the lumbar spine appears similar to the prior MRI. There are osseous metastases within the S1 and S2 bodies, which appear new from this prior exam. Osseous metastatic disease is also present within the partially imaged right iliac bone.  Impression   Erin [name] is a 61 y.o. 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 She presents with a chief complaint of progressive bilateral lower extremity weakness and numbness which has been gradually progressive since Oct 2022 and has acutely worsened over the last 2 weeks and now unable to ambulate. We were asked to evaluate. Her neurologic examination is notable for BL proximal > distal lower ext weakness and paresthesias in BL feet.  Workup with progression of thoracic and lumbar leptomeningieal disease. In addition, there is progression of the cervical spine stenosis at C4-5 and C5-6 but this was also noted on prior MRI from 08/02/21 althou it does appear somewhat progressed. She did seem to have cord compression but no cord signal abnormality back in nov 2022. She still denies any lhermite's sign, no urinary or bowel incontinence or constipation. She is not hyperreflexic on exam.  I suspect that her symptoms are more likely explained by the progression of the leptomeningeal disease rather than the noted cord compression. I would expect greater  involvement of the arms if the cord compression was symptomatic and I would expect her to be hyperreflexic with saddle anesthesia or lhermitte's sign or urinary or bowel incontinence or constipation. I do think that this needs to be addressed and this should be discussed with Neuro surgery team.  Recommendations  - Please discuss the noted cord compression in cervical region with Neurosurgery team at some point. This can be discussed after we have a plan from oncology team about potential treatment option. - Will defer management of leptomeningeal disease to oncology and Neuro-oncology team. - Labs for potential neuropathy are not significant. - We will signoff. Please feel free to contact us with any questions or concerns. ______________________________________________________________________ Plan discussed with Dr. Nani Ravens over phone.  Thank you for the opportunity to take part in the care of this patient. If you have any further questions, please contact the neurology consultation attending.  Signed,  [place] Pager Number [phone] _ _ _   _ __   _ __ _ _  __ __   _ __   __ _

## 2021-11-15 NOTE — ED Notes (Addendum)
Pt had no urinated since arrival. Bladder scan performed. Found to have close to 700cc urine. 12 F used to drain bladder. Draining but very slowly. 200cc in 10 mins. Next bladder scan showed decrease in urine in bladder but not empty. Continued to let drain. 10 mins later, bladder still draining and down to 400cc left in bladder. RN rechecked and pt continues to drain. She is down to 197cc of urine per bladder scanner.  ?

## 2021-11-15 NOTE — ED Notes (Signed)
Pt attached to cardiac monitor x3. VSS at this time. Pt's Mother at the bedside, currently signing for the pt, who is mute and deaf. Per the Mother signing with the pt, pt denies any complaints or concerns at this time. There is an indwelling foley catheter in place, draining yellow urine into bag. 200cc's yellow urine in the bag at this time.  ?

## 2021-11-15 NOTE — Progress Notes (Addendum)
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  She presents with progressive lower extremity weakness and falls.  Progressively worsening over the past several weeks.  She is having some difficulty with urination and bowel movements but no incontinence.  No pain other than from falling.  Occasional nausea and vomiting, started on triple therapy for H. pylori and has been improving.   New events last 24 hours / Subjective: Mother is present at bedside, she is also having a discussion with her neurologist who is explaining her recent MRI of the cervical, thoracic, and lumbar spine.  Unfortunately does show progression of leptomeningeal metastatic disease in the cauda equina region.  She continues to have paresthesias in her feet.  This is worse at night.  She is not having any pain.  Of note, she used a video interpreter using sign language to communicate with me.  Assessment & Plan:   Principal Problem:   Leptomeningeal metastases [facility])  Appreciate Neurology and Oncology  Oxycodone prn  Namenda 10 mg bid  Fall precautions  Start gabapentin 100 mg 3 times daily for paresthesias   Active Problems:   Gastritis  Protonix 40 mg/d  Add Pepcid 20 mg bid  Triple therapy for H pylori    Malignant neoplasm of lower-outer quadrant of left breast of female, estrogen receptor negative (HCC)    Bone metastases (HCC)    Lower extremity weakness  PT/OT  DVT prophylaxis: Lovenox Code Status: Full Family Communication: Mom at bedside Coming From: Home Disposition Plan: Pending PT eval and oncology disposition Barriers to Discharge: Plan of care, work up, clinical improvement  Consultants:   Neurology Oncology PT/OT  Procedures:  None  Antimicrobials:  Anti-infectives (From admission, onward)    Start     Dose/Rate Route Frequency Ordered Stop   11/14/21 2330  clarithromycin (BIAXIN) tablet 500 mg       Note to Pharmacy: For 14 days     500 mg Oral 2 times daily 11/14/21 2308     11/14/21 2315  metroNIDAZOLE (FLAGYL) tablet 500 mg        500 mg Oral 3 times daily 11/14/21 2308          Objective: Vitals:   11/15/21 0445 11/15/21 0515 11/15/21 0655 11/15/21 0842  BP: 109/68 98/65 115/75 109/66  Pulse: 79 80 79 78  Resp:    19  Temp:    98 F (36.7 C)  TempSrc:    Oral  SpO2: 100% 100% 99% 100%  Weight:      Height:       Filed Weights   11/14/21 1622  Weight: 54 kg    Examination:  General exam: Appears calm and emotionally distressed Respiratory system: Clear to auscultation. Respiratory effort normal. No respiratory distress. No conversational dyspnea.  Cardiovascular system: S1 & S2 heard, RRR. No pedal edema. Gastrointestinal system: Abdomen is nondistended, soft and nontender. Normal bowel sounds heard. Central nervous system: Alert and oriented. No focal neurological deficits.  Extremities: Symmetric in appearance  Skin: No rashes, lesions or ulcers on exposed skin  Psychiatry: Judgement and insight appear normal. Tearful during visit.   Data Reviewed: I have personally reviewed following labs and imaging studies  CBC: Recent Labs  Lab 11/10/21  0354 11/14/21 1637 11/15/21 0530  WBC 5.8 9.7 7.7  NEUTROABS 4.5 7.7  --   HGB 10.1* 12.1 10.6*  HCT 30.6* 37.3 32.5*  MCV 99.0 102.2* 102.2*  PLT 175 206 656   Basic Metabolic Panel: Recent Labs  Lab 11/10/21 0937 11/14/21 1637 11/15/21 0530  NA 139 136 135  K 3.8 3.1* 4.6  CL 105 102 105  CO2 '28 24 24  '$ GLUCOSE 124* 135* 112*  BUN '12 19 15  '$ CREATININE 0.61 0.69 0.68  CALCIUM 9.3 9.2 8.2*  MG  --   --  2.3   GFR: Estimated Creatinine Clearance: 57.8 mL/min (by C-G formula  based on SCr of 0.68 mg/dL).  Liver Function Tests: Recent Labs  Lab 11/10/21 0937 11/14/21 1637 11/15/21 0530  AST 23 45* 43*  ALT 45* 78* 56*  ALKPHOS 113 125 98  BILITOT 0.4 0.4 1.4*  PROT 7.5 8.5* 7.1  ALBUMIN 3.9 4.0 3.4*   Thyroid Function Tests: Recent Labs    11/14/21 0530  TSH 2.559   Anemia Panel: Recent Labs    11/14/21 1622  FOLATE 10.7    Recent Results (from the past 240 hour(s))  Resp Panel by RT-PCR (Flu A&B, Covid) Nasopharyngeal Swab     Status: None   Collection Time: 11/14/21  9:10 PM   Specimen: Nasopharyngeal Swab; Nasopharyngeal(NP) swabs in vial transport medium  Result Value Ref Range Status   SARS Coronavirus 2 by RT PCR NEGATIVE NEGATIVE Final    Comment: (NOTE) SARS-CoV-2 target nucleic acids are NOT DETECTED.  The SARS-CoV-2 RNA is generally detectable in upper respiratory specimens during the acute phase of infection. The lowest concentration of SARS-CoV-2 viral copies this assay can detect is 138 copies/mL. A negative result does not preclude SARS-Cov-2 infection and should not be used as the sole basis for treatment or other patient management decisions. A negative result may occur with  improper specimen collection/handling, submission of specimen other than nasopharyngeal swab, presence of viral mutation(s) within the areas targeted by this assay, and inadequate number of viral copies(<138 copies/mL). A negative result must be combined with clinical observations, patient history, and epidemiological information. The expected result is Negative.  Fact Sheet for Patients:  EntrepreneurPulse.com.au  Fact Sheet for Healthcare Providers:  IncredibleEmployment.be  This test is no t yet approved or cleared by the [place] FDA and  has been authorized for detection and/or diagnosis of SARS-CoV-2 by FDA under an Emergency Use Authorization (EUA). This EUA will remain  in effect (meaning this  test can be used) for the duration of the COVID-19 declaration under Section 564(b)(1) of the Act, 21 U.S.C.section 360bbb-3(b)(1), unless the authorization is terminated  or revoked sooner.       Influenza A by PCR NEGATIVE NEGATIVE Final   Influenza B by PCR NEGATIVE NEGATIVE Final    Comment: (NOTE) The Xpert Xpress SARS-CoV-2/FLU/RSV plus assay is intended as an aid in the diagnosis of influenza from Nasopharyngeal swab specimens and should not be used as a sole basis for treatment. Nasal washings and aspirates are unacceptable for Xpert Xpress SARS-CoV-2/FLU/RSV testing.  Fact Sheet for Patients: EntrepreneurPulse.com.au  Fact Sheet for Healthcare Providers: IncredibleEmployment.be  This test is not yet approved or cleared by the [place] FDA and has been authorized for detection and/or diagnosis of SARS-CoV-2 by FDA under an Emergency Use Authorization (EUA). This EUA will remain in effect (meaning this test can be used) for the duration of the COVID-19 declaration under Section 564(b)(1) of  the Act, 21 U.S.C. section 360bbb-3(b)(1), unless the authorization is terminated or revoked.  Performed at [facility], [place] [address] [place], [place] [address]       Radiology Studies: CT Head Wo Contrast  Result Date: 11/14/2021 CLINICAL DATA:  Neuro deficit, acute, stroke suspected Leg weakness and numbness for 2 weeks.  Falls. EXAM: CT HEAD WITHOUT CONTRAST TECHNIQUE: Contiguous axial images were obtained from the base of the skull through the vertex without intravenous contrast. RADIATION DOSE REDUCTION: This exam was performed according to the departmental dose-optimization program which includes automated exposure control, adjustment of the mA and/or kV according to patient size and/or use of iterative reconstruction technique. COMPARISON:  Brain MRI 07/10/2021 FINDINGS: Brain: No acute intracranial hemorrhage. Again  seen remote lacunar infarcts in the left basal ganglia and caudate. There is minimal vague low-density involving the superior aspect of both cerebellar hemispheres, leptomeningeal disease suspected in this region on prior brain MRI. No evidence of territorial ischemia. No subdural or extra-axial collection. There is no midline shift or evident mass effect. No hydrocephalus. Vascular: No hyperdense vessel or unexpected calcification. Skull: No fracture or focal lesion. Sinuses/Orbits: Paranasal sinuses and mastoid air cells are clear. The visualized orbits are unremarkable. Other: None. IMPRESSION: 1. No acute intracranial abnormality. 2. Remote lacunar infarcts in the left basal ganglia and caudate. 3. Minimal vague low-density involving the superior aspect of both cerebellar hemispheres, leptomeningeal disease suspected in this region on prior brain MRI. Direct comparison is difficult due to differences in modality. Electronically Signed   By: Keith Rake M.D.   On: 11/14/2021 17:20   MR Brain Wo Contrast (neuro protocol)  Result Date: 11/14/2021 CLINICAL DATA:  Provided history: Neuro deficit, acute, stroke suspected. EXAM: MRI HEAD WITHOUT CONTRAST TECHNIQUE: Multiplanar, multiecho pulse sequences of the brain and surrounding structures were obtained without intravenous contrast. COMPARISON:  Head CT 11/14/2021.  Brain MRI 10/26/2021. FINDINGS: Brain: Cerebral volume is normal. Redemonstrated chronic small-vessel infarct within the left corona radiata and basal ganglia. Similar to the recent prior brain MRI of 10/26/2021, there is T2 FLAIR hyperintense signal abnormality within the left greater than right cerebellar hemispheres. Given the findings on the recent prior brain MRI, this may reflect edema related to leptomeningeal metastatic disease and/or gliosis. There is no acute infarct. No chronic intracranial blood products. No extra-axial fluid collection. No midline shift. Vascular: Maintained flow  voids within the proximal large arterial vessels. Skull and upper cervical spine: No focal suspicious marrow lesion. Sinuses/Orbits: Visualized orbits show no acute finding. Mild mucosal thickening within the right maxillary sinus. Other: Left mastoid effusion. Trace fluid also present within the right mastoid air cells. IMPRESSION: No evidence of acute infarct. Redemonstrated chronic small-vessel infarct within the left corona radiata and basal ganglia. Similar to the recent prior brain MRI of 10/26/2021, there is T2 FLAIR hyperintense signal abnormality within the left greater than right cerebellar hemispheres. Given the findings on the recent prior contrast-enhanced MRI, this may reflect edema related to leptomeningeal metastatic disease and/or gliosis. Mild mucosal thickening within the right maxillary sinus. Left mastoid effusion. Trace fluid also present within the right mastoid air cells. Electronically Signed   By: Kellie Simmering D.O.   On: 11/14/2021 19:50   MR CERVICAL SPINE W WO CONTRAST  Result Date: 11/15/2021 CLINICAL DATA:  Provided history: Leptomeningeal metastases suspected; leptomeningeal disease of spine suspected. EXAM: MRI CERVICAL SPINE WITHOUT AND WITH CONTRAST TECHNIQUE: Multiplanar and multiecho pulse sequences of the cervical spine, to include the  craniocervical junction and cervicothoracic junction, were obtained without and with intravenous contrast. CONTRAST:  36m GADAVIST GADOBUTROL 1 MMOL/ML IV SOLN COMPARISON:  Total spine MRI 08/03/2019. FINDINGS: Alignment: Straightening of the expected cervical lordosis. No significant spondylolisthesis. Vertebrae: Vertebral body height is maintained. No significant marrow edema or focal suspicious osseous lesion within the cervical spine. Cord: Redemonstrated foci of T2 hyperintense signal abnormality within the left aspect of the spinal cord at C4-C5 and C5-C6, likely reflecting myelomalacia and/or focal edema from compressive myelopathy.  Posterior Fossa, vertebral arteries, paraspinal tissues: Posterior fossa incompletely assessed. Flow voids preserved within the imaged cervical vertebral arteries. Paraspinal soft tissues unremarkable. Disc levels: Cervical spondylosis is unchanged as compared to the recent prior MRI of 08/02/2021. Findings are most notably as follows. At C4-C5, there is mild disc degeneration. Disc bulge with bilateral uncovertebral hypertrophy. Superimposed broad-based central disc protrusion. Facet arthrosis/ligamentum flavum hypertrophy. Severe spinal canal stenosis with spinal cord flattening. T2 hyperintense signal abnormality within the left aspect of the spinal cord compatible with myelomalacia and/or focal edema. Moderate bilateral neural foraminal narrowing (greater on the right). At C5-C6, there is moderate disc degeneration. Disc bulge with endplate spurring and bilateral uncovertebral hypertrophy. Superimposed broad-based central disc protrusion. Facet arthrosis and ligamentum flavum hypertrophy. Severe spinal canal stenosis with spinal cord flattening. T2 hyperintense signal abnormality within the left aspect of the spinal cord compatible with myelomalacia and/or focal edema. Moderate/severe bilateral neural foraminal narrowing. No definite leptomeningeal metastatic disease is identified within the cervical canal. IMPRESSION: No definite leptomeningeal metastatic disease is identified within the cervical spinal canal. Cervical spondylosis has not significant changed from the recent prior cervical spine MRI of 08/02/2021. Findings are most notably as follows. At C4-C5, there is multifactorial severe spinal canal stenosis with spinal cord flattening. T2 hyperintense signal abnormality within the left aspect of the spinal cord compatible with myelomalacia and/or focal edema. Moderate bilateral neural foraminal narrowing. At C5-C6, there is multifactorial severe spinal canal stenosis with spinal cord flattening. T2  hyperintense signal abnormality within the left aspect of the spinal cord compatible with myelomalacia and/or focal edema. Moderate/severe bilateral neural foraminal narrowing. Electronically Signed   By: KKellie SimmeringD.O.   On: 11/15/2021 13:13   MR THORACIC SPINE W WO CONTRAST  Result Date: 11/15/2021 CLINICAL DATA:  Left a meningeal metastases suspected. EXAM: MRI THORACIC WITHOUT AND WITH CONTRAST TECHNIQUE: Multiplanar and multiecho pulse sequences of the thoracic spine were obtained without and with intravenous contrast. CONTRAST:  575mGADAVIST GADOBUTROL 1 MMOL/ML IV SOLN COMPARISON:  Total spine MRI 08/02/2021. FINDINGS: Alignment:  No significant spondylolisthesis. Vertebrae: Similar appearance of multifocal osseous metastatic disease within the thoracic spine. Unchanged mild chronic superior endplate deformities and chronic height loss at T3, T5 and T6. Vertebral body height is otherwise maintained within the thoracic spine. Cord: No signal abnormality identified within the spinal cord. There is extensive abnormal enhancement along the distal aspect of the spinal cord compatible (extending inferiorly from the T9 level), progressed from the prior total spine MRI of 08/02/2021 and compatible with leptomeningeal metastatic disease. Paraspinal and other soft tissues: No paraspinal soft tissue mass. Disc levels: Unchanged thoracic spondylosis. No significant spinal canal stenosis. No significant neural foraminal narrowing. IMPRESSION: Extensive abnormal enhancement along the distal aspect of the spinal cord (extending inferiorly from the T9 level), progressed from the prior total spine MRI of 08/02/2021 and compatible with leptomeningeal metastatic disease. Multifocal osseous metastatic disease within the thoracic spine is similar as compared to the prior MRI. Unchanged mild  T3, T5 and T6 compression deformities. Superimposed thoracic spondylosis. No significant spinal canal or foraminal stenosis.  Electronically Signed   By: Kellie Simmering D.O.   On: 11/15/2021 13:30   MR LUMBAR SPINE W WO CONTRAST  Result Date: 11/15/2021 CLINICAL DATA:  Leptomeningeal metastases suspected. EXAM: MRI LUMBAR SPINE WITHOUT AND WITH CONTRAST TECHNIQUE: Multiplanar and multiecho pulse sequences of the lumbar spine were obtained without and with intravenous contrast. CONTRAST:  68m GADAVIST GADOBUTROL 1 MMOL/ML IV SOLN COMPARISON:  Total spine MRI 08/02/2021. FINDINGS: Segmentation: 5 lumbar vertebrae. The caudal most well-formed interval disc space is designated L5-S1. Alignment: Unchanged 4 mm bony retropulsion at the level of the L3 superior endplate. Vertebrae: Multifocal osseous metastatic disease throughout the lumbar spine, similar in appearance as compared to the recent prior MRI of 08/02/2021. There are osseous metastases within the S1 and S2 bodies which appear new from the prior MRI. Unchanged height loss at site of an L3 pathologic vertebral compression fracture. Vertebral body height is otherwise maintained. Osseous metastatic disease is also present within the partially imaged right iliac bone. Conus medullaris and cauda equina: Conus extends to the L2 level. Significantly for progressed from the prior examination, there is extensive abnormal enhancement along the visualized distal spinal cord and cauda equina nerve roots compatible with leptomeningeal metastatic disease. Paraspinal and other soft tissues: No paraspinal soft tissue mass. Disc levels: At L2-L3, bony retropulsion, a disc bulge, facet arthrosis and ligamentum flavum hypertrophy contribute to mild to moderate spinal canal narrowing, unchanged. Mild bilateral neural foraminal narrowing also present at this level. Lumbar spondylosis is otherwise unchanged as compared to the recent prior MRI of 08/02/2021. No significant spinal canal stenosis, and no more than mild neural foraminal narrowing, at the remaining levels. IMPRESSION: Extensive abnormal  enhancement along the visualized distal spinal cord and cauda equina nerve roots, progressed from the recent prior MRI of 08/02/2021, and compatible with leptomeningeal metastatic disease. Unchanged height loss at site of an L3 pathologic compression fracture. As before, bony retropulsion and spondylosis at L2-L3 resulted in mild-to-moderate spinal canal narrowing, and mild bilateral neural foraminal narrowing. Lumbar spondylosis is otherwise unchanged. No significant spinal canal stenosis, and no more than mild neural foraminal narrowing, at the remaining levels. Multifocal osseous metastatic disease within the lumbar spine appears similar to the prior MRI. There are osseous metastases within the S1 and S2 bodies, which appear new from this prior exam. Osseous metastatic disease is also present within the partially imaged right iliac bone. Electronically Signed   By: KKellie SimmeringD.O.   On: 11/15/2021 13:21     Scheduled Meds:  clarithromycin  500 mg Oral BID   enoxaparin (LOVENOX) injection  40 mg Subcutaneous Q24H   memantine  10 mg Oral BID   metroNIDAZOLE  500 mg Oral TID   pantoprazole  40 mg Oral Daily   sodium chloride flush  3 mL Intravenous Q12H   Continuous Infusions:   LOS: 0 days   Time spent: 25 minutes   NShelda Pal DO [facility] 11/15/2021, 2:24 PM   Available via Epic secure chat 7am-7pm After these hours, please refer to coverage provider listed on [email or web address]

## 2021-11-15 NOTE — ED Notes (Signed)
Neuro at the bedside.

## 2021-11-15 NOTE — Evaluation (Signed)
Occupational Therapy Evaluation ?Patient Details ?Name: Erin [name] ?MRN: [id number] ?DOB: 1961-04-12 ?Today's Date: 11/15/2021 ? ? ?History of Present Illness Patient is a 61 year old female who presented with increased weakness in BLE and falls. 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 PMH: breast cancet metasis, bone metastsis, ovarian CA, chemo 11/05/21, radiation enduced esophagitis, gasteritis.  ? ?Clinical Impression ?  ?Patient is a 61 year old female who was admitted for above. Patient was living at home with mom with 3 flights of steps to reach apartment. Currently, patients functional activity tolerance, decreased BLE strength, and pain levels are impacting ability to participate in ADLs.  Patient would need complete physical assistance to get into apartment at this time as patient was fatigued with scooting to head of bed on this date. Patient would continue to benefit from skilled OT services at this time while admitted and after d/c to address noted deficits in order to improve overall safety and independence in ADLs.  ? ?   ? ?Recommendations for follow up therapy are one component of a multi-disciplinary discharge planning process, led by the attending physician.  Recommendations may be updated based on patient status, additional functional criteria and insurance authorization.  ? ?Follow Up Recommendations ? Skilled nursing-short term rehab (<3 hours/day)  ?  ?Assistance Recommended at Discharge Frequent or constant Supervision/Assistance  ?Patient can return home with the following Assistance with cooking/housework;A lot of help with bathing/dressing/bathroom;A lot of help with walking and/or transfers;Direct supervision/assist for financial management;Assist for transportation;Help with stairs or ramp for entrance;Direct supervision/assist for medications management ? ?  ?Functional Status Assessment ? Patient has had a recent decline in their functional status and  demonstrates the ability to make significant improvements in function in a reasonable and predictable amount of time.  ?Equipment Recommendations ? BSC/3in1  ?  ?Recommendations for Other Services   ? ? ?  ?Precautions / Restrictions Precautions ?Precautions: Fall ?Precaution Comments: ASL interpreter needed ?Restrictions ?Weight Bearing Restrictions: No  ? ?  ? ?Mobility Bed Mobility ?Overal bed mobility: Needs Assistance ?Bed Mobility: Supine to Sit, Sit to Supine ?  ?  ?Supine to sit: Min assist ?Sit to supine: Mod assist ?  ?General bed mobility comments: with physical assist to get BLE to edge of bed and back into bed with education on log rolling. ?  ? ?Transfers ?  ?  ?  ?  ?  ?  ?  ?  ?  ?  ?  ? ?  ?Balance Overall balance assessment: Needs assistance ?Sitting-balance support: No upper extremity supported ?Sitting balance-Leahy Scale: Fair ?Sitting balance - Comments: sitting on edge of bed for tasks. ?  ?  ?  ?Standing balance comment: deferred on this date ?  ?  ?  ?  ?  ?  ?  ?  ?  ?  ?  ?   ? ?ADL either performed or assessed with clinical judgement  ? ?ADL Overall ADL's : Needs assistance/impaired ?Eating/Feeding: Set up;Sitting ?  ?Grooming: Dance movement psychotherapist;Wash/dry hands;Set up;Sitting ?  ?Upper Body Bathing: Set up;Sitting ?  ?Lower Body Bathing: Maximal assistance;Bed level ?  ?Upper Body Dressing : Set up;Sitting ?  ?Lower Body Dressing: Maximal assistance;Bed level ?Lower Body Dressing Details (indicate cue type and reason): patient attempted to bring BLE onto lap for don/doff socks with patient unable to bring LE onto lap without increased pain. patient declined to wear gripper socks on this date and no shoes were available so standing attempts  were deferred. ?Toilet Transfer: +2 for physical assistance;+2 for safety/equipment ?Toilet Transfer Details (indicate cue type and reason): patient was able to participate in minimal scooting to [facility] with patient fatiguing after three scoots to [facility] with BUE  supporting full movement. ?Toileting- Clothing Manipulation and Hygiene: Maximal assistance;Bed level ?  ?  ?  ?Functional mobility during ADLs: +2 for physical assistance;+2 for safety/equipment ?   ? ? ? ?Vision Patient Visual Report: No change from baseline ?   ?   ?Perception   ?  ?Praxis   ?  ? ?Pertinent Vitals/Pain Pain Assessment ?Pain Assessment: Faces ?Faces Pain Scale: Hurts even more ?Pain Location: back with movement ?Pain Descriptors / Indicators: Grimacing, Discomfort, Guarding ?Pain Intervention(s): Limited activity within patient's tolerance, Monitored during session, Patient requesting pain meds-RN notified, Repositioned  ? ? ? ?Hand Dominance Right ?  ?Extremity/Trunk Assessment Upper Extremity Assessment ?Upper Extremity Assessment: Overall WFL for tasks assessed ?  ?Lower Extremity Assessment ?Lower Extremity Assessment: Defer to PT evaluation ?  ?Cervical / Trunk Assessment ?Cervical / Trunk Assessment: Normal ?  ?Communication Communication ?Communication: No difficulties ?  ?Cognition Arousal/Alertness: Awake/alert ?Behavior During Therapy: [facility] for tasks assessed/performed ?Overall Cognitive Status: Within Functional Limits for tasks assessed ?  ?  ?  ?  ?  ?  ?  ?  ?  ?  ?  ?  ?  ?  ?  ?  ?General Comments: patient appeared to have decreased insight to current level of deficits. patient also needs ASL interpreter for communication ?  ?  ?General Comments    ? ?  ?Exercises   ?  ?Shoulder Instructions    ? ? ?Home Living Family/patient expects to be discharged to:: Private residence ?Living Arrangements: Alone ?Available Help at Discharge: Family;Available 24 hours/day ?Type of Home: House ?Home Access: Stairs to enter ?Entrance Stairs-Number of Steps: 3 flights of steps. ?  ?Home Layout: One level ?  ?  ?Bathroom Shower/Tub: Tub/shower unit ?  ?  ?  ?  ?Home Equipment: Conservation officer, nature (2 wheels);Rollator (4 wheels);Tub bench ?  ?  ?  ? ?  ?Prior Functioning/Environment Prior Level of Function  : Needs assist ?  ?  ?  ?  ?  ?  ?  ?ADLs Comments: patient reported going to bathroom with it taking increased time with RW. patient reported completing ADLs at [facility] level with support from. ?  ? ?  ?  ?OT Problem List: Decreased activity tolerance;Impaired balance (sitting and/or standing);Decreased safety awareness;Decreased knowledge of precautions;Decreased knowledge of use of DME or AE;Decreased coordination ?  ?   ?OT Treatment/Interventions: Self-care/ADL training;Therapeutic exercise;Neuromuscular education;Energy conservation;DME and/or AE instruction;Therapeutic activities;Balance training;Patient/family education  ?  ?OT Goals(Current goals can be found in the care plan section) Acute Rehab OT Goals ?Patient Stated Goal: to get back home with mom ?OT Goal Formulation: With patient ?Time For Goal Achievement: 11/29/21  ?OT Frequency: Min 2X/week ?  ? ?Co-evaluation   ?  ?  ?  ?  ? ?  ?AM-PAC OT "6 Clicks" Daily Activity     ?Outcome Measure Help from another person eating meals?: A Little ?Help from another person taking care of personal grooming?: A Little ?Help from another person toileting, which includes using toliet, bedpan, or urinal?: Total ?Help from another person bathing (including washing, rinsing, drying)?: A Lot ?Help from another person to put on and taking off regular upper body clothing?: A Little ?Help from another person to put on and taking off  regular lower body clothing?: A Lot ?6 Click Score: 14 ?  ?End of Session Nurse Communication: Patient requests pain meds (nurse cleared patient to participate) ? ?Activity Tolerance: Patient tolerated treatment well ?Patient left: in bed;with call bell/phone within reach ? ?OT Visit Diagnosis: Unsteadiness on feet (R26.81);Other abnormalities of gait and mobility (R26.89);Muscle weakness (generalized) (M62.81);Repeated falls (R29.6);Pain  ?              ?Time: 1031-2811 ?OT Time Calculation (min): 17 min ?Charges:  OT General Charges ?$OT Visit: 1  Visit ?OT Evaluation ?$OT Eval Moderate Complexity: 1 Mod ? ?Sharona Rovner OTR/L, MS ?Acute Rehabilitation Department ?Office# [phone] ?Pager# [phone] ? ? ?Erin [name] ?11/15/2021, 4:45 PM ?

## 2021-11-16 DIAGNOSIS — G8929 Other chronic pain: Secondary | ICD-10-CM | POA: Diagnosis present

## 2021-11-16 DIAGNOSIS — E785 Hyperlipidemia, unspecified: Secondary | ICD-10-CM | POA: Diagnosis present

## 2021-11-16 DIAGNOSIS — Z9221 Personal history of antineoplastic chemotherapy: Secondary | ICD-10-CM | POA: Diagnosis not present

## 2021-11-16 DIAGNOSIS — E876 Hypokalemia: Secondary | ICD-10-CM | POA: Diagnosis present

## 2021-11-16 DIAGNOSIS — B370 Candidal stomatitis: Secondary | ICD-10-CM | POA: Diagnosis not present

## 2021-11-16 DIAGNOSIS — G9340 Encephalopathy, unspecified: Secondary | ICD-10-CM | POA: Diagnosis not present

## 2021-11-16 DIAGNOSIS — C7949 Secondary malignant neoplasm of other parts of nervous system: Secondary | ICD-10-CM

## 2021-11-16 DIAGNOSIS — M8458XA Pathological fracture in neoplastic disease, other specified site, initial encounter for fracture: Secondary | ICD-10-CM | POA: Diagnosis present

## 2021-11-16 DIAGNOSIS — W1830XA Fall on same level, unspecified, initial encounter: Secondary | ICD-10-CM | POA: Diagnosis present

## 2021-11-16 DIAGNOSIS — R609 Edema, unspecified: Secondary | ICD-10-CM | POA: Diagnosis not present

## 2021-11-16 DIAGNOSIS — B9681 Helicobacter pylori [H. pylori] as the cause of diseases classified elsewhere: Secondary | ICD-10-CM | POA: Diagnosis present

## 2021-11-16 DIAGNOSIS — I10 Essential (primary) hypertension: Secondary | ICD-10-CM | POA: Diagnosis present

## 2021-11-16 DIAGNOSIS — R29898 Other symptoms and signs involving the musculoskeletal system: Secondary | ICD-10-CM | POA: Diagnosis not present

## 2021-11-16 DIAGNOSIS — I959 Hypotension, unspecified: Secondary | ICD-10-CM | POA: Diagnosis not present

## 2021-11-16 DIAGNOSIS — K219 Gastro-esophageal reflux disease without esophagitis: Secondary | ICD-10-CM | POA: Diagnosis present

## 2021-11-16 DIAGNOSIS — E119 Type 2 diabetes mellitus without complications: Secondary | ICD-10-CM | POA: Diagnosis present

## 2021-11-16 DIAGNOSIS — R5381 Other malaise: Secondary | ICD-10-CM | POA: Diagnosis not present

## 2021-11-16 DIAGNOSIS — M4802 Spinal stenosis, cervical region: Secondary | ICD-10-CM | POA: Diagnosis present

## 2021-11-16 DIAGNOSIS — C50512 Malignant neoplasm of lower-outer quadrant of left female breast: Secondary | ICD-10-CM | POA: Diagnosis present

## 2021-11-16 DIAGNOSIS — D7589 Other specified diseases of blood and blood-forming organs: Secondary | ICD-10-CM | POA: Diagnosis present

## 2021-11-16 DIAGNOSIS — C787 Secondary malignant neoplasm of liver and intrahepatic bile duct: Secondary | ICD-10-CM | POA: Diagnosis present

## 2021-11-16 DIAGNOSIS — Z79899 Other long term (current) drug therapy: Secondary | ICD-10-CM | POA: Diagnosis not present

## 2021-11-16 DIAGNOSIS — N179 Acute kidney failure, unspecified: Secondary | ICD-10-CM | POA: Diagnosis not present

## 2021-11-16 DIAGNOSIS — K5900 Constipation, unspecified: Secondary | ICD-10-CM | POA: Diagnosis present

## 2021-11-16 DIAGNOSIS — D638 Anemia in other chronic diseases classified elsewhere: Secondary | ICD-10-CM | POA: Diagnosis present

## 2021-11-16 DIAGNOSIS — E871 Hypo-osmolality and hyponatremia: Secondary | ICD-10-CM | POA: Diagnosis present

## 2021-11-16 DIAGNOSIS — E1165 Type 2 diabetes mellitus with hyperglycemia: Secondary | ICD-10-CM | POA: Diagnosis not present

## 2021-11-16 DIAGNOSIS — H9193 Unspecified hearing loss, bilateral: Secondary | ICD-10-CM | POA: Diagnosis present

## 2021-11-16 DIAGNOSIS — C50919 Malignant neoplasm of unspecified site of unspecified female breast: Secondary | ICD-10-CM | POA: Diagnosis not present

## 2021-11-16 DIAGNOSIS — K2970 Gastritis, unspecified, without bleeding: Secondary | ICD-10-CM | POA: Diagnosis present

## 2021-11-16 DIAGNOSIS — R296 Repeated falls: Secondary | ICD-10-CM | POA: Diagnosis present

## 2021-11-16 DIAGNOSIS — C7951 Secondary malignant neoplasm of bone: Secondary | ICD-10-CM | POA: Diagnosis present

## 2021-11-16 DIAGNOSIS — G9341 Metabolic encephalopathy: Secondary | ICD-10-CM | POA: Diagnosis not present

## 2021-11-16 DIAGNOSIS — Z8544 Personal history of malignant neoplasm of other female genital organs: Secondary | ICD-10-CM | POA: Diagnosis not present

## 2021-11-16 DIAGNOSIS — Z20822 Contact with and (suspected) exposure to covid-19: Secondary | ICD-10-CM | POA: Diagnosis present

## 2021-11-16 LAB — COMPREHENSIVE METABOLIC PANEL
ALT: 50 U/L — ABNORMAL HIGH (ref 0–44)
AST: 29 U/L (ref 15–41)
Albumin: 3.4 g/dL — ABNORMAL LOW (ref 3.5–5.0)
Alkaline Phosphatase: 99 U/L (ref 38–126)
Anion gap: 7 (ref 5–15)
BUN: 9 mg/dL (ref 6–20)
CO2: 23 mmol/L (ref 22–32)
Calcium: 8.4 mg/dL — ABNORMAL LOW (ref 8.9–10.3)
Chloride: 104 mmol/L (ref 98–111)
Creatinine, Ser: 0.58 mg/dL (ref 0.44–1.00)
GFR, Estimated: >60 mL/min (ref >60)
Glucose, Bld: 103 mg/dL — ABNORMAL HIGH (ref 70–99)
Potassium: 3.6 mmol/L (ref 3.5–5.1)
Sodium: 134 mmol/L — ABNORMAL LOW (ref 135–145)
Total Bilirubin: 0.5 mg/dL (ref 0.3–1.2)
Total Protein: 7 g/dL (ref 6.5–8.1)

## 2021-11-16 LAB — CBC
HCT: 31.3 % — ABNORMAL LOW (ref 36.0–46.0)
Hemoglobin: 10 g/dL — ABNORMAL LOW (ref 12.0–15.0)
MCH: 32.7 pg (ref 26.0–34.0)
MCHC: 31.9 g/dL (ref 30.0–36.0)
MCV: 102.3 fL — ABNORMAL HIGH (ref 80.0–100.0)
Platelets: 173 10*3/uL (ref 150–400)
RBC: 3.06 MIL/uL — ABNORMAL LOW (ref 3.87–5.11)
RDW: 16.6 % — ABNORMAL HIGH (ref 11.5–15.5)
WBC: 5.5 10*3/uL (ref 4.0–10.5)
nRBC: 0 % (ref 0.0–0.2)

## 2021-11-16 MED ORDER — DEXAMETHASONE SODIUM PHOSPHATE 4 MG/ML IJ SOLN
4.0000 mg | Freq: Four times a day (QID) | INTRAMUSCULAR | Status: DC
  Administered 2021-11-16 – 2021-11-25 (×36): 4 mg via INTRAVENOUS
  Filled 2021-11-16 (×36): qty 1

## 2021-11-16 MED ORDER — PHENOL 1.4 % MT LIQD
1.0000 | OROMUCOSAL | Status: DC | PRN
  Administered 2021-11-16: 1 via OROMUCOSAL
  Filled 2021-11-16: qty 177

## 2021-11-16 NOTE — Evaluation (Signed)
Physical Therapy Evaluation ?Patient Details ?Name: Erin [name] ?MRN: [id number] ?DOB: [birth year] ?Today's Date: 11/16/2021 ? ?History of Present Illness ? Patient is a 61 year old female who presented with increased weakness in BLE and falls. 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 PMH: breast cancet metasis, bone metastsis, ovarian CA, chemo 11/05/21, radiation enduced esophagitis, gasteritis.  ?Clinical Impression ? Pt admitted with above diagnosis.  Pt currently with functional limitations due to the deficits listed below (see PT Problem List). Pt will benefit from skilled PT to increase their independence and safety with mobility to allow discharge to the venue listed below.  Pt required MOD A with close chair follow to ambulate 5' x 2.  She would benefit from an apartment on the 1st floor.  Family wants to take her home rather than rehab.  Would recommend a 3/1 BSC, wheelchair, and if possible a hospital bed, or rails for her own bed.  Recommend HHPT and maximize aide time available through the [facility].  Her main complaints were weakness in her thighs and tingling in her lower legs/feet. ?   ?   ? ?Recommendations for follow up therapy are one component of a multi-disciplinary discharge planning process, led by the attending physician.  Recommendations may be updated based on patient status, additional functional criteria and insurance authorization. ? ?Follow Up Recommendations Home health PT (HHPT and aides) ? ?  ?Assistance Recommended at Discharge Frequent or constant Supervision/Assistance  ?Patient can return home with the following ? A lot of help with walking and/or transfers;A lot of help with bathing/dressing/bathroom;Assistance with cooking/housework;Help with stairs or ramp for entrance ? ?  ?Equipment Recommendations BSC/3in1;Wheelchair (measurements PT);Hospital bed  ?Recommendations for Other Services ?    ?  ?Functional Status Assessment Patient has had a recent  decline in their functional status and/or demonstrates limited ability to make significant improvements in function in a reasonable and predictable amount of time  ? ?  ?Precautions / Restrictions Precautions ?Precautions: Fall ?Precaution Comments: ASL interpreter needed ?Restrictions ?Weight Bearing Restrictions: No  ? ?  ? ?Mobility ? Bed Mobility ?Overal bed mobility: Needs Assistance ?Bed Mobility: Supine to Sit ?  ?  ?Supine to sit: Min guard ?  ?  ?General bed mobility comments: min/guard to come to EOB with rails ?  ? ?Transfers ?Overall transfer level: Needs assistance ?Equipment used: Rolling walker (2 wheels) ?Transfers: Sit to/from Stand, Bed to chair/wheelchair/BSC ?Sit to Stand: Min assist ?Stand pivot transfers: Mod assist ?  ?  ?  ?  ?General transfer comment: tactile cues for hand placement.  Stood and then SPT to recliner. Weakness noted with some knee flexion and slight buckeling. ?  ? ?Ambulation/Gait ?Ambulation/Gait assistance: Mod assist ?Gait Distance (Feet): 5 Feet (x2) ?Assistive device: Rolling walker (2 wheels) ?Gait Pattern/deviations: Decreased step length - right, Decreased step length - left, Trunk flexed, Ataxic ?Gait velocity: decreased ?  ?  ?General Gait Details: Amb 5 feet with recliner behind her.  Unsteady BOS, but motivated and after rest break ambulated another 5'. ? ?Stairs ?  ?  ?  ?  ?  ? ?Wheelchair Mobility ?  ? ?Modified Rankin (Stroke Patients Only) ?  ? ?  ? ?Balance   ?Sitting-balance support: No upper extremity supported ?Sitting balance-Leahy Scale: Fair ?  ?  ?Standing balance support: Bilateral upper extremity supported ?Standing balance-Leahy Scale: Poor ?Standing balance comment: Poor- balance due to weakness in LE ?  ?  ?  ?  ?  ?  ?  ?  ?  ?  ?  ?   ? ? ? ?  Pertinent Vitals/Pain Pain Assessment ?Pain Assessment: Faces ?Faces Pain Scale: Hurts little more ?Pain Location: back with movement ?Pain Descriptors / Indicators: Grimacing ?Pain Intervention(s): Limited  activity within patient's tolerance, Monitored during session  ? ? ?Home Living Family/patient expects to be discharged to:: Private residence ?Living Arrangements: Alone (mom comes in every day) ?Available Help at Discharge: Family;Available 24 hours/day ?Type of Home: House ?Home Access: Stairs to enter ?  ?Entrance Stairs-Number of Steps: 3 flights of steps. ?  ?Home Layout: One level ?Home Equipment: Conservation officer, nature (2 wheels);Rollator (4 wheels);Cane - single point ?   ?  ?Prior Function Prior Level of Function : Needs assist;History of Falls (last six months) ?  ?  ?  ?  ?  ?  ?  ?ADLs Comments: patient reported going to bathroom with it taking increased time with RW. patient reported completing ADLs at [facility] level with support from. ?  ? ? ?Hand Dominance  ? Dominant Hand: Right ? ?  ?Extremity/Trunk Assessment  ? Upper Extremity Assessment ?Upper Extremity Assessment: Defer to OT evaluation ?  ? ?Lower Extremity Assessment ?Lower Extremity Assessment: Generalized weakness;RLE deficits/detail;LLE deficits/detail ?RLE Sensation: history of peripheral neuropathy ?LLE Sensation: history of peripheral neuropathy ?  ? ?   ?Communication  ? Communication: No difficulties  ?Cognition Arousal/Alertness: Awake/alert ?Behavior During Therapy: [facility] for tasks assessed/performed ?Overall Cognitive Status: Within Functional Limits for tasks assessed ?  ?  ?  ?  ?  ?  ?  ?  ?  ?  ?  ?  ?  ?  ?  ?  ?General Comments: Son and niece present and assisted with sign language.  PT also knows some ASL, so communicated this way. ?  ?  ? ?  ?General Comments   ? ?  ?Exercises    ? ?Assessment/Plan  ?  ?PT Assessment Patient needs continued PT services  ?PT Problem List Decreased strength;Decreased activity tolerance;Decreased balance;Decreased mobility;Decreased knowledge of precautions;Decreased coordination ? ?   ?  ?PT Treatment Interventions DME instruction;Gait training;Functional mobility training;Balance training;Therapeutic  exercise;Therapeutic activities   ? ?PT Goals (Current goals can be found in the Care Plan section)  ?Acute Rehab PT Goals ?Patient Stated Goal: To take her home, not go to rehab ?PT Goal Formulation: With family ?Time For Goal Achievement: 11/30/21 ?Potential to Achieve Goals: Fair ? ?  ?Frequency Min 3X/week ?  ? ? ?Co-evaluation   ?  ?  ?  ?  ? ? ?  ?AM-PAC PT "6 Clicks" Mobility  ?Outcome Measure Help needed turning from your back to your side while in a flat bed without using bedrails?: A Little ?Help needed moving from lying on your back to sitting on the side of a flat bed without using bedrails?: A Little ?Help needed moving to and from a bed to a chair (including a wheelchair)?: A Lot ?Help needed standing up from a chair using your arms (e.g., wheelchair or bedside chair)?: A Lot ?Help needed to walk in hospital room?: A Lot ?Help needed climbing 3-5 steps with a railing? : A Lot ?6 Click Score: 14 ? ?  ?End of Session Equipment Utilized During Treatment: Gait belt ?Activity Tolerance: Patient limited by fatigue ?Patient left: in chair;with family/visitor present ?Nurse Communication: Mobility status ?PT Visit Diagnosis: Unsteadiness on feet (R26.81);Muscle weakness (generalized) (M62.81);Other abnormalities of gait and mobility (R26.89) ?  ? ?Time: 3338-3291 ?PT Time Calculation (min) (ACUTE ONLY): 26 min ? ? ?Charges:   PT Evaluation ?$PT Eval Moderate Complexity:  1 Mod ?PT Treatments ?$Gait Training: 8-22 mins ?  ?   ? ? ?Santiago Glad L. Tamala Julian, PT ? ?11/16/2021 ? ? ?Galen Manila ?11/16/2021, 2:42 PM ? ?

## 2021-11-16 NOTE — Progress Notes (Signed)
IP PROGRESS NOTE  Subjective:   Erin [name] is followed by Dr. Payton Mccallum for treatment of metastatic breast cancer.  She is currently being treated capecitabine, trastuzumab, and tucatinib.  It was placed on hold secondary to nausea and diarrhea. 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  She was admitted 11/14/2021 with progressive leg weakness and falls.  I saw her today with her mother who served as a sign language interpreter.  They report her legs have become weaker over the past several weeks resulting in falls.  She was admitted for further evaluation. She has been evaluated by neurology.  Objective: Vital signs in last 24 hours: Blood pressure 116/74, pulse 79, temperature 98.2 F (36.8 C), temperature source Oral, resp. rate 17, height '5\' 1"'$  (1.549 m), weight 119 lb (54 kg), last menstrual period 07/09/2017, SpO2 97 %.  Intake/Output from previous day: 03/04 0701 - 03/05 0700 In: 480 [P.O.:480] Out: 1250 [Urine:1250]  Physical Exam:  HEENT: No thrush Lungs: Clear bilaterally Cardiac: Regular rate and rhythm Abdomen: Nontender, no hepatosplenomegaly Extremities: No leg edema Neurologic: Alert, the motor exam appears intact in the upper extremities bilaterally.  Decreased strength with leg raise bilaterally.  The motor exam appears intact at the feet aside from 4/5 strength with dorsi flexion at the left foot sensation in tact to light touch at the legs  Portacath/PICC-without erythema  Lab Results: Recent Labs    11/15/21 0530 11/16/21 0524  WBC 7.7 5.5  HGB 10.6* 10.0*  HCT 32.5* 31.3*  PLT 195 173    BMET Recent Labs    11/15/21 0530 11/16/21 0524  NA 135 134*  K 4.6 3.6  CL 105 104  CO2 24 23  GLUCOSE 112* 103*  BUN 15 9  CREATININE 0.68 0.58  CALCIUM 8.2* 8.4*    No results found for: CEA1, CEA, [id number], CA125  Studies/Results: CT Head Wo Contrast  Result Date: 11/14/2021 CLINICAL DATA:  Neuro  deficit, acute, stroke suspected Leg weakness and numbness for 2 weeks.  Falls. EXAM: CT HEAD WITHOUT CONTRAST TECHNIQUE: Contiguous axial images were obtained from the base of the skull through the vertex without intravenous contrast. RADIATION DOSE REDUCTION: This exam was performed according to the departmental dose-optimization program which includes automated exposure control, adjustment of the mA and/or kV according to patient size and/or use of iterative reconstruction technique. COMPARISON:  Brain MRI 07/10/2021 FINDINGS: Brain: No acute intracranial hemorrhage. Again seen remote lacunar infarcts in the left basal ganglia and caudate. There is minimal vague low-density involving the superior aspect of both cerebellar hemispheres, leptomeningeal disease suspected in this region on prior brain MRI. No evidence of territorial ischemia. No subdural or extra-axial collection. There is no midline shift or evident mass effect. No hydrocephalus. Vascular: No hyperdense vessel or unexpected calcification. Skull: No fracture or focal lesion. Sinuses/Orbits: Paranasal sinuses and mastoid air cells are clear. The visualized orbits are unremarkable. Other: None. IMPRESSION: 1. No acute intracranial abnormality. 2. Remote lacunar infarcts in the left basal ganglia and caudate. 3. Minimal vague low-density involving the superior aspect of both cerebellar hemispheres, leptomeningeal disease suspected in this region on prior brain MRI. Direct comparison is difficult due to differences in modality. Electronically Signed   By: Keith Rake M.D.   On: 11/14/2021 17:20   MR Brain Wo Contrast (neuro protocol)  Result Date: 11/14/2021 CLINICAL DATA:  Provided history: Neuro deficit, acute, stroke suspected. EXAM: MRI HEAD WITHOUT CONTRAST TECHNIQUE: Multiplanar, multiecho pulse sequences  of the brain and surrounding structures were obtained without intravenous contrast. COMPARISON:  Head CT 11/14/2021.  Brain MRI  10/26/2021. FINDINGS: Brain: Cerebral volume is normal. Redemonstrated chronic small-vessel infarct within the left corona radiata and basal ganglia. Similar to the recent prior brain MRI of 10/26/2021, there is T2 FLAIR hyperintense signal abnormality within the left greater than right cerebellar hemispheres. Given the findings on the recent prior brain MRI, this may reflect edema related to leptomeningeal metastatic disease and/or gliosis. There is no acute infarct. No chronic intracranial blood products. No extra-axial fluid collection. No midline shift. Vascular: Maintained flow voids within the proximal large arterial vessels. Skull and upper cervical spine: No focal suspicious marrow lesion. Sinuses/Orbits: Visualized orbits show no acute finding. Mild mucosal thickening within the right maxillary sinus. Other: Left mastoid effusion. Trace fluid also present within the right mastoid air cells. IMPRESSION: No evidence of acute infarct. Redemonstrated chronic small-vessel infarct within the left corona radiata and basal ganglia. Similar to the recent prior brain MRI of 10/26/2021, there is T2 FLAIR hyperintense signal abnormality within the left greater than right cerebellar hemispheres. Given the findings on the recent prior contrast-enhanced MRI, this may reflect edema related to leptomeningeal metastatic disease and/or gliosis. Mild mucosal thickening within the right maxillary sinus. Left mastoid effusion. Trace fluid also present within the right mastoid air cells. Electronically Signed   By: Kellie Simmering D.O.   On: 11/14/2021 19:50   MR CERVICAL SPINE W WO CONTRAST  Result Date: 11/15/2021 CLINICAL DATA:  Provided history: Leptomeningeal metastases suspected; leptomeningeal disease of spine suspected. EXAM: MRI CERVICAL SPINE WITHOUT AND WITH CONTRAST TECHNIQUE: Multiplanar and multiecho pulse sequences of the cervical spine, to include the craniocervical junction and cervicothoracic junction, were  obtained without and with intravenous contrast. CONTRAST:  30m GADAVIST GADOBUTROL 1 MMOL/ML IV SOLN COMPARISON:  Total spine MRI 08/03/2019. FINDINGS: Alignment: Straightening of the expected cervical lordosis. No significant spondylolisthesis. Vertebrae: Vertebral body height is maintained. No significant marrow edema or focal suspicious osseous lesion within the cervical spine. Cord: Redemonstrated foci of T2 hyperintense signal abnormality within the left aspect of the spinal cord at C4-C5 and C5-C6, likely reflecting myelomalacia and/or focal edema from compressive myelopathy. Posterior Fossa, vertebral arteries, paraspinal tissues: Posterior fossa incompletely assessed. Flow voids preserved within the imaged cervical vertebral arteries. Paraspinal soft tissues unremarkable. Disc levels: Cervical spondylosis is unchanged as compared to the recent prior MRI of 08/02/2021. Findings are most notably as follows. At C4-C5, there is mild disc degeneration. Disc bulge with bilateral uncovertebral hypertrophy. Superimposed broad-based central disc protrusion. Facet arthrosis/ligamentum flavum hypertrophy. Severe spinal canal stenosis with spinal cord flattening. T2 hyperintense signal abnormality within the left aspect of the spinal cord compatible with myelomalacia and/or focal edema. Moderate bilateral neural foraminal narrowing (greater on the right). At C5-C6, there is moderate disc degeneration. Disc bulge with endplate spurring and bilateral uncovertebral hypertrophy. Superimposed broad-based central disc protrusion. Facet arthrosis and ligamentum flavum hypertrophy. Severe spinal canal stenosis with spinal cord flattening. T2 hyperintense signal abnormality within the left aspect of the spinal cord compatible with myelomalacia and/or focal edema. Moderate/severe bilateral neural foraminal narrowing. No definite leptomeningeal metastatic disease is identified within the cervical canal. IMPRESSION: No definite  leptomeningeal metastatic disease is identified within the cervical spinal canal. Cervical spondylosis has not significant changed from the recent prior cervical spine MRI of 08/02/2021. Findings are most notably as follows. At C4-C5, there is multifactorial severe spinal canal stenosis with spinal cord flattening. T2 hyperintense signal  abnormality within the left aspect of the spinal cord compatible with myelomalacia and/or focal edema. Moderate bilateral neural foraminal narrowing. At C5-C6, there is multifactorial severe spinal canal stenosis with spinal cord flattening. T2 hyperintense signal abnormality within the left aspect of the spinal cord compatible with myelomalacia and/or focal edema. Moderate/severe bilateral neural foraminal narrowing. Electronically Signed   By: Kellie Simmering D.O.   On: 11/15/2021 13:13   MR THORACIC SPINE W WO CONTRAST  Result Date: 11/15/2021 CLINICAL DATA:  Left a meningeal metastases suspected. EXAM: MRI THORACIC WITHOUT AND WITH CONTRAST TECHNIQUE: Multiplanar and multiecho pulse sequences of the thoracic spine were obtained without and with intravenous contrast. CONTRAST:  79m GADAVIST GADOBUTROL 1 MMOL/ML IV SOLN COMPARISON:  Total spine MRI 08/02/2021. FINDINGS: Alignment:  No significant spondylolisthesis. Vertebrae: Similar appearance of multifocal osseous metastatic disease within the thoracic spine. Unchanged mild chronic superior endplate deformities and chronic height loss at T3, T5 and T6. Vertebral body height is otherwise maintained within the thoracic spine. Cord: No signal abnormality identified within the spinal cord. There is extensive abnormal enhancement along the distal aspect of the spinal cord compatible (extending inferiorly from the T9 level), progressed from the prior total spine MRI of 08/02/2021 and compatible with leptomeningeal metastatic disease. Paraspinal and other soft tissues: No paraspinal soft tissue mass. Disc levels: Unchanged thoracic  spondylosis. No significant spinal canal stenosis. No significant neural foraminal narrowing. IMPRESSION: Extensive abnormal enhancement along the distal aspect of the spinal cord (extending inferiorly from the T9 level), progressed from the prior total spine MRI of 08/02/2021 and compatible with leptomeningeal metastatic disease. Multifocal osseous metastatic disease within the thoracic spine is similar as compared to the prior MRI. Unchanged mild T3, T5 and T6 compression deformities. Superimposed thoracic spondylosis. No significant spinal canal or foraminal stenosis. Electronically Signed   By: KKellie SimmeringD.O.   On: 11/15/2021 13:30   MR LUMBAR SPINE W WO CONTRAST  Result Date: 11/15/2021 CLINICAL DATA:  Leptomeningeal metastases suspected. EXAM: MRI LUMBAR SPINE WITHOUT AND WITH CONTRAST TECHNIQUE: Multiplanar and multiecho pulse sequences of the lumbar spine were obtained without and with intravenous contrast. CONTRAST:  597mGADAVIST GADOBUTROL 1 MMOL/ML IV SOLN COMPARISON:  Total spine MRI 08/02/2021. FINDINGS: Segmentation: 5 lumbar vertebrae. The caudal most well-formed interval disc space is designated L5-S1. Alignment: Unchanged 4 mm bony retropulsion at the level of the L3 superior endplate. Vertebrae: Multifocal osseous metastatic disease throughout the lumbar spine, similar in appearance as compared to the recent prior MRI of 08/02/2021. There are osseous metastases within the S1 and S2 bodies which appear new from the prior MRI. Unchanged height loss at site of an L3 pathologic vertebral compression fracture. Vertebral body height is otherwise maintained. Osseous metastatic disease is also present within the partially imaged right iliac bone. Conus medullaris and cauda equina: Conus extends to the L2 level. Significantly for progressed from the prior examination, there is extensive abnormal enhancement along the visualized distal spinal cord and cauda equina nerve roots compatible with  leptomeningeal metastatic disease. Paraspinal and other soft tissues: No paraspinal soft tissue mass. Disc levels: At L2-L3, bony retropulsion, a disc bulge, facet arthrosis and ligamentum flavum hypertrophy contribute to mild to moderate spinal canal narrowing, unchanged. Mild bilateral neural foraminal narrowing also present at this level. Lumbar spondylosis is otherwise unchanged as compared to the recent prior MRI of 08/02/2021. No significant spinal canal stenosis, and no more than mild neural foraminal narrowing, at the remaining levels. IMPRESSION: Extensive abnormal enhancement along the  visualized distal spinal cord and cauda equina nerve roots, progressed from the recent prior MRI of 08/02/2021, and compatible with leptomeningeal metastatic disease. Unchanged height loss at site of an L3 pathologic compression fracture. As before, bony retropulsion and spondylosis at L2-L3 resulted in mild-to-moderate spinal canal narrowing, and mild bilateral neural foraminal narrowing. Lumbar spondylosis is otherwise unchanged. No significant spinal canal stenosis, and no more than mild neural foraminal narrowing, at the remaining levels. Multifocal osseous metastatic disease within the lumbar spine appears similar to the prior MRI. There are osseous metastases within the S1 and S2 bodies, which appear new from this prior exam. Osseous metastatic disease is also present within the partially imaged right iliac bone. Electronically Signed   By: Kellie Simmering D.O.   On: 11/15/2021 13:21    Medications: I have reviewed the patient's current medications.  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 fall 2022 Leg weakness/numbness secondary to #2 MRI lumbar spine 11/15/2021 with abnormal enhancement at the  distal spinal cord and cauda equina consistent with leptomeningeal metastatic disease, multifocal osseous metastatic disease, L3 pathologic pression fracture with bony retropulsion and mild to moderate spinal canal narrowing MRI thoracic spine 11/15/2021-abnormal enhancement at the distal spinal cord extending inferiorly from T9, multifocal osseous metastatic disease MRI cervical spine 11/15/2021-thigh focal severe spinal canal stenosis with spinal cord flattening at C4-5 and C5-6    Erin [name] has metastatic breast cancer.  She has progressive metastatic disease despite multiple systemic therapies and intrathecal methotrexate.  She presents with leg weakness and numbness, progressive over the past few weeks.  I suspect the leg weakness is related to leptomeningeal disease involving the distal spinal cord and cauda equina nerve roots.  She was evaluated by neurology and they feel her symptoms are most likely explained by progression of the leptomeningeal disease. Treatment options are limited.  I recommend a trial of Decadron.  She may be a candidate for lumbosacral radiation.  I will ask Dr. [name] to see her tomorrow to discuss comfort care versus salvage systemic therapy.  I will discuss the case with Dr. Mickeal Skinner.  Recommendations: Trial of Decadron Consult radiation oncology to consider palliative radiation to the lumbosacral spine and potentially the cervical spine Dr. [name] will see her 11/17/2021  LOS: 0 days   Betsy Coder, MD   11/16/2021, 11:46 AM

## 2021-11-16 NOTE — Progress Notes (Signed)
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  She presents with progressive lower extremity weakness and falls.  Progressively worsening over the past several weeks.  She is having some difficulty with urination and bowel movements but no incontinence.  No pain other than from falling.  Occasional nausea and vomiting, started on triple therapy for H. pylori and has been improving.   New events last 24 hours / Subjective: Mother is bedside and helps interpret with American sign language.  Had some constipation yesterday so MiraLAX was added.  She feels well overall, not much change in her neurologic signs or symptoms.  I did reach out to the oncology team who did not have her on their list.  They will have her oncologist Dr. [name] see her tomorrow.  Recommended Decadron 4 mg every 6 hours in the meantime.  Assessment & Plan:   Principal Problem:   Leptomeningeal metastases [facility])  Appreciate oncology who will have her regular oncologist see her tomorrow  Decadron 4 mg every 6 hours  Oxycodone as needed  Namenda 10 mg twice daily  Gabapentin 100 mg 3 times daily for paresthesias  Active Problems:   Gastritis  Continue triple therapy    Malignant neoplasm of lower-outer quadrant of left breast of female, estrogen receptor negative (HCC)    Bone metastases (HCC)    Lower extremity weakness  PT/OT  DVT prophylaxis: Lovenox Code Status: Full Family Communication: Mom Coming From: Home Disposition Plan: Pending dc, care management team consulted Barriers to Discharge: Plan of care, oncology  Consultants:  Neurology signed off on 11/15/21 Oncology contacted again on  3/5  Procedures:  None  Antimicrobials:  Anti-infectives (From admission, onward)    Start     Dose/Rate Route Frequency Ordered Stop   11/14/21 2330  clarithromycin (BIAXIN) tablet 500 mg       Note to Pharmacy: For 14 days     500 mg Oral 2 times daily 11/14/21 2308     11/14/21 2315  metroNIDAZOLE (FLAGYL) tablet 500 mg        500 mg Oral 3 times daily 11/14/21 2308          Objective: Vitals:   11/15/21 1856 11/15/21 2028 11/16/21 0146 11/16/21 0600  BP: 130/71 132/86 112/81 116/74  Pulse: 82 84 86 79  Resp: '20 18 18 17  '$ Temp: 98.9 F (37.2 C) 98.3 F (36.8 C) 98.1 F (36.7 C) 98.2 F (36.8 C)  TempSrc: Oral Oral Oral Oral  SpO2: 97% 100% 100% 97%  Weight:      Height:        Intake/Output Summary (Last 24 hours) at 11/16/2021 1032 Last data filed at 11/15/2021 1904 Gross per 24 hour  Intake 480 ml  Output 1250 ml  Net -770 ml   Filed Weights   11/14/21 1622  Weight: 54 kg    Examination:  General exam: Appears calm and comfortable  Respiratory system: Clear to auscultation. Respiratory effort normal. No respiratory distress. No conversational dyspnea.  Cardiovascular system: S1 & S2 heard, RRR. No LE edema. Gastrointestinal system: Abdomen is nondistended, soft and nontender. Normal bowel sounds heard. Central nervous system: Alert and oriented. No focal neurological deficits.  Extremities: Symmetric in appearance  Skin: No  rashes, lesions or ulcers on exposed skin  Psychiatry: Judgement and insight appear normal. Mood & affect appropriate.   Data Reviewed: I have personally reviewed following labs and imaging studies  CBC: Recent Labs  Lab 11/10/21 0937 11/14/21 1637 11/15/21 0530 11/16/21 0524  WBC 5.8 9.7 7.7 5.5  NEUTROABS 4.5 7.7  --   --   HGB 10.1* 12.1 10.6* 10.0*  HCT 30.6* 37.3 32.5* 31.3*  MCV 99.0 102.2* 102.2* 102.3*  PLT 175 206 195 735   Basic Metabolic Panel: Recent Labs  Lab 11/10/21 0937 11/14/21 1637 11/15/21 0530  11/16/21 0524  NA 139 136 135 134*  K 3.8 3.1* 4.6 3.6  CL 105 102 105 104  CO2 '28 24 24 23  '$ GLUCOSE 124* 135* 112* 103*  BUN '12 19 15 9  '$ CREATININE 0.61 0.69 0.68 0.58  CALCIUM 9.3 9.2 8.2* 8.4*  MG  --   --  2.3  --    GFR: Estimated Creatinine Clearance: 57.8 mL/min (by C-G formula based on SCr of 0.58 mg/dL).  Liver Function Tests: Recent Labs  Lab 11/10/21 0937 11/14/21 1637 11/15/21 0530 11/16/21 0524  AST 23 45* 43* 29  ALT 45* 78* 56* 50*  ALKPHOS 113 125 98 99  BILITOT 0.4 0.4 1.4* 0.5  PROT 7.5 8.5* 7.1 7.0  ALBUMIN 3.9 4.0 3.4* 3.4*   Thyroid Function Tests: Recent Labs    11/14/21 0530  TSH 2.559   Anemia Panel: Recent Labs    11/14/21 1622  VITAMINB12 6,159*  FOLATE 10.7    Recent Results (from the past 240 hour(s))  Resp Panel by RT-PCR (Flu A&B, Covid) Nasopharyngeal Swab     Status: None   Collection Time: 11/14/21  9:10 PM   Specimen: Nasopharyngeal Swab; Nasopharyngeal(NP) swabs in vial transport medium  Result Value Ref Range Status   SARS Coronavirus 2 by RT PCR NEGATIVE NEGATIVE Final    Comment: (NOTE) SARS-CoV-2 target nucleic acids are NOT DETECTED.  The SARS-CoV-2 RNA is generally detectable in upper respiratory specimens during the acute phase of infection. The lowest concentration of SARS-CoV-2 viral copies this assay can detect is 138 copies/mL. A negative result does not preclude SARS-Cov-2 infection and should not be used as the sole basis for treatment or other patient management decisions. A negative result may occur with  improper specimen collection/handling, submission of specimen other than nasopharyngeal swab, presence of viral mutation(s) within the areas targeted by this assay, and inadequate number of viral copies(<138 copies/mL). A negative result must be combined with clinical observations, patient history, and epidemiological information. The expected result is Negative.  Fact Sheet for Patients:   EntrepreneurPulse.com.au  Fact Sheet for Healthcare Providers:  IncredibleEmployment.be  This test is no t yet approved or cleared by the [place] FDA and  has been authorized for detection and/or diagnosis of SARS-CoV-2 by FDA under an Emergency Use Authorization (EUA). This EUA will remain  in effect (meaning this test can be used) for the duration of the COVID-19 declaration under Section 564(b)(1) of the Act, 21 U.S.C.section 360bbb-3(b)(1), unless the authorization is terminated  or revoked sooner.       Influenza A by PCR NEGATIVE NEGATIVE Final   Influenza B by PCR NEGATIVE NEGATIVE Final    Comment: (NOTE) The Xpert Xpress SARS-CoV-2/FLU/RSV plus assay is intended as an aid in the diagnosis of influenza from Nasopharyngeal swab specimens and should not be used as a sole basis for treatment. Nasal washings and aspirates are unacceptable for  Xpert Xpress SARS-CoV-2/FLU/RSV testing.  Fact Sheet for Patients: EntrepreneurPulse.com.au  Fact Sheet for Healthcare Providers: IncredibleEmployment.be  This test is not yet approved or cleared by the [place] FDA and has been authorized for detection and/or diagnosis of SARS-CoV-2 by FDA under an Emergency Use Authorization (EUA). This EUA will remain in effect (meaning this test can be used) for the duration of the COVID-19 declaration under Section 564(b)(1) of the Act, 21 U.S.C. section 360bbb-3(b)(1), unless the authorization is terminated or revoked.  Performed at [facility], [place] [address] [place], [place] [address]       Radiology Studies: CT Head Wo Contrast  Result Date: 11/14/2021 CLINICAL DATA:  Neuro deficit, acute, stroke suspected Leg weakness and numbness for 2 weeks.  Falls. EXAM: CT HEAD WITHOUT CONTRAST TECHNIQUE: Contiguous axial images were obtained from the base of the skull through the vertex without  intravenous contrast. RADIATION DOSE REDUCTION: This exam was performed according to the departmental dose-optimization program which includes automated exposure control, adjustment of the mA and/or kV according to patient size and/or use of iterative reconstruction technique. COMPARISON:  Brain MRI 07/10/2021 FINDINGS: Brain: No acute intracranial hemorrhage. Again seen remote lacunar infarcts in the left basal ganglia and caudate. There is minimal vague low-density involving the superior aspect of both cerebellar hemispheres, leptomeningeal disease suspected in this region on prior brain MRI. No evidence of territorial ischemia. No subdural or extra-axial collection. There is no midline shift or evident mass effect. No hydrocephalus. Vascular: No hyperdense vessel or unexpected calcification. Skull: No fracture or focal lesion. Sinuses/Orbits: Paranasal sinuses and mastoid air cells are clear. The visualized orbits are unremarkable. Other: None. IMPRESSION: 1. No acute intracranial abnormality. 2. Remote lacunar infarcts in the left basal ganglia and caudate. 3. Minimal vague low-density involving the superior aspect of both cerebellar hemispheres, leptomeningeal disease suspected in this region on prior brain MRI. Direct comparison is difficult due to differences in modality. Electronically Signed   By: Keith Rake M.D.   On: 11/14/2021 17:20   MR Brain Wo Contrast (neuro protocol)  Result Date: 11/14/2021 CLINICAL DATA:  Provided history: Neuro deficit, acute, stroke suspected. EXAM: MRI HEAD WITHOUT CONTRAST TECHNIQUE: Multiplanar, multiecho pulse sequences of the brain and surrounding structures were obtained without intravenous contrast. COMPARISON:  Head CT 11/14/2021.  Brain MRI 10/26/2021. FINDINGS: Brain: Cerebral volume is normal. Redemonstrated chronic small-vessel infarct within the left corona radiata and basal ganglia. Similar to the recent prior brain MRI of 10/26/2021, there is T2 FLAIR  hyperintense signal abnormality within the left greater than right cerebellar hemispheres. Given the findings on the recent prior brain MRI, this may reflect edema related to leptomeningeal metastatic disease and/or gliosis. There is no acute infarct. No chronic intracranial blood products. No extra-axial fluid collection. No midline shift. Vascular: Maintained flow voids within the proximal large arterial vessels. Skull and upper cervical spine: No focal suspicious marrow lesion. Sinuses/Orbits: Visualized orbits show no acute finding. Mild mucosal thickening within the right maxillary sinus. Other: Left mastoid effusion. Trace fluid also present within the right mastoid air cells. IMPRESSION: No evidence of acute infarct. Redemonstrated chronic small-vessel infarct within the left corona radiata and basal ganglia. Similar to the recent prior brain MRI of 10/26/2021, there is T2 FLAIR hyperintense signal abnormality within the left greater than right cerebellar hemispheres. Given the findings on the recent prior contrast-enhanced MRI, this may reflect edema related to leptomeningeal metastatic disease and/or gliosis. Mild mucosal thickening within the right maxillary sinus. Left  mastoid effusion. Trace fluid also present within the right mastoid air cells. Electronically Signed   By: Kellie Simmering D.O.   On: 11/14/2021 19:50   MR CERVICAL SPINE W WO CONTRAST  Result Date: 11/15/2021 CLINICAL DATA:  Provided history: Leptomeningeal metastases suspected; leptomeningeal disease of spine suspected. EXAM: MRI CERVICAL SPINE WITHOUT AND WITH CONTRAST TECHNIQUE: Multiplanar and multiecho pulse sequences of the cervical spine, to include the craniocervical junction and cervicothoracic junction, were obtained without and with intravenous contrast. CONTRAST:  19m GADAVIST GADOBUTROL 1 MMOL/ML IV SOLN COMPARISON:  Total spine MRI 08/03/2019. FINDINGS: Alignment: Straightening of the expected cervical lordosis. No  significant spondylolisthesis. Vertebrae: Vertebral body height is maintained. No significant marrow edema or focal suspicious osseous lesion within the cervical spine. Cord: Redemonstrated foci of T2 hyperintense signal abnormality within the left aspect of the spinal cord at C4-C5 and C5-C6, likely reflecting myelomalacia and/or focal edema from compressive myelopathy. Posterior Fossa, vertebral arteries, paraspinal tissues: Posterior fossa incompletely assessed. Flow voids preserved within the imaged cervical vertebral arteries. Paraspinal soft tissues unremarkable. Disc levels: Cervical spondylosis is unchanged as compared to the recent prior MRI of 08/02/2021. Findings are most notably as follows. At C4-C5, there is mild disc degeneration. Disc bulge with bilateral uncovertebral hypertrophy. Superimposed broad-based central disc protrusion. Facet arthrosis/ligamentum flavum hypertrophy. Severe spinal canal stenosis with spinal cord flattening. T2 hyperintense signal abnormality within the left aspect of the spinal cord compatible with myelomalacia and/or focal edema. Moderate bilateral neural foraminal narrowing (greater on the right). At C5-C6, there is moderate disc degeneration. Disc bulge with endplate spurring and bilateral uncovertebral hypertrophy. Superimposed broad-based central disc protrusion. Facet arthrosis and ligamentum flavum hypertrophy. Severe spinal canal stenosis with spinal cord flattening. T2 hyperintense signal abnormality within the left aspect of the spinal cord compatible with myelomalacia and/or focal edema. Moderate/severe bilateral neural foraminal narrowing. No definite leptomeningeal metastatic disease is identified within the cervical canal. IMPRESSION: No definite leptomeningeal metastatic disease is identified within the cervical spinal canal. Cervical spondylosis has not significant changed from the recent prior cervical spine MRI of 08/02/2021. Findings are most notably as  follows. At C4-C5, there is multifactorial severe spinal canal stenosis with spinal cord flattening. T2 hyperintense signal abnormality within the left aspect of the spinal cord compatible with myelomalacia and/or focal edema. Moderate bilateral neural foraminal narrowing. At C5-C6, there is multifactorial severe spinal canal stenosis with spinal cord flattening. T2 hyperintense signal abnormality within the left aspect of the spinal cord compatible with myelomalacia and/or focal edema. Moderate/severe bilateral neural foraminal narrowing. Electronically Signed   By: KKellie SimmeringD.O.   On: 11/15/2021 13:13   MR THORACIC SPINE W WO CONTRAST  Result Date: 11/15/2021 CLINICAL DATA:  Left a meningeal metastases suspected. EXAM: MRI THORACIC WITHOUT AND WITH CONTRAST TECHNIQUE: Multiplanar and multiecho pulse sequences of the thoracic spine were obtained without and with intravenous contrast. CONTRAST:  558mGADAVIST GADOBUTROL 1 MMOL/ML IV SOLN COMPARISON:  Total spine MRI 08/02/2021. FINDINGS: Alignment:  No significant spondylolisthesis. Vertebrae: Similar appearance of multifocal osseous metastatic disease within the thoracic spine. Unchanged mild chronic superior endplate deformities and chronic height loss at T3, T5 and T6. Vertebral body height is otherwise maintained within the thoracic spine. Cord: No signal abnormality identified within the spinal cord. There is extensive abnormal enhancement along the distal aspect of the spinal cord compatible (extending inferiorly from the T9 level), progressed from the prior total spine MRI of 08/02/2021 and compatible with leptomeningeal metastatic disease. Paraspinal and other soft  tissues: No paraspinal soft tissue mass. Disc levels: Unchanged thoracic spondylosis. No significant spinal canal stenosis. No significant neural foraminal narrowing. IMPRESSION: Extensive abnormal enhancement along the distal aspect of the spinal cord (extending inferiorly from the T9  level), progressed from the prior total spine MRI of 08/02/2021 and compatible with leptomeningeal metastatic disease. Multifocal osseous metastatic disease within the thoracic spine is similar as compared to the prior MRI. Unchanged mild T3, T5 and T6 compression deformities. Superimposed thoracic spondylosis. No significant spinal canal or foraminal stenosis. Electronically Signed   By: Kellie Simmering D.O.   On: 11/15/2021 13:30   MR LUMBAR SPINE W WO CONTRAST  Result Date: 11/15/2021 CLINICAL DATA:  Leptomeningeal metastases suspected. EXAM: MRI LUMBAR SPINE WITHOUT AND WITH CONTRAST TECHNIQUE: Multiplanar and multiecho pulse sequences of the lumbar spine were obtained without and with intravenous contrast. CONTRAST:  39m GADAVIST GADOBUTROL 1 MMOL/ML IV SOLN COMPARISON:  Total spine MRI 08/02/2021. FINDINGS: Segmentation: 5 lumbar vertebrae. The caudal most well-formed interval disc space is designated L5-S1. Alignment: Unchanged 4 mm bony retropulsion at the level of the L3 superior endplate. Vertebrae: Multifocal osseous metastatic disease throughout the lumbar spine, similar in appearance as compared to the recent prior MRI of 08/02/2021. There are osseous metastases within the S1 and S2 bodies which appear new from the prior MRI. Unchanged height loss at site of an L3 pathologic vertebral compression fracture. Vertebral body height is otherwise maintained. Osseous metastatic disease is also present within the partially imaged right iliac bone. Conus medullaris and cauda equina: Conus extends to the L2 level. Significantly for progressed from the prior examination, there is extensive abnormal enhancement along the visualized distal spinal cord and cauda equina nerve roots compatible with leptomeningeal metastatic disease. Paraspinal and other soft tissues: No paraspinal soft tissue mass. Disc levels: At L2-L3, bony retropulsion, a disc bulge, facet arthrosis and ligamentum flavum hypertrophy contribute to  mild to moderate spinal canal narrowing, unchanged. Mild bilateral neural foraminal narrowing also present at this level. Lumbar spondylosis is otherwise unchanged as compared to the recent prior MRI of 08/02/2021. No significant spinal canal stenosis, and no more than mild neural foraminal narrowing, at the remaining levels. IMPRESSION: Extensive abnormal enhancement along the visualized distal spinal cord and cauda equina nerve roots, progressed from the recent prior MRI of 08/02/2021, and compatible with leptomeningeal metastatic disease. Unchanged height loss at site of an L3 pathologic compression fracture. As before, bony retropulsion and spondylosis at L2-L3 resulted in mild-to-moderate spinal canal narrowing, and mild bilateral neural foraminal narrowing. Lumbar spondylosis is otherwise unchanged. No significant spinal canal stenosis, and no more than mild neural foraminal narrowing, at the remaining levels. Multifocal osseous metastatic disease within the lumbar spine appears similar to the prior MRI. There are osseous metastases within the S1 and S2 bodies, which appear new from this prior exam. Osseous metastatic disease is also present within the partially imaged right iliac bone. Electronically Signed   By: KKellie SimmeringD.O.   On: 11/15/2021 13:21     Scheduled Meds:  clarithromycin  500 mg Oral BID   dexamethasone (DECADRON) injection  4 mg Intravenous Q6H   enoxaparin (LOVENOX) injection  40 mg Subcutaneous Q24H   famotidine  20 mg Oral BID   gabapentin  100 mg Oral TID   memantine  10 mg Oral BID   metroNIDAZOLE  500 mg Oral TID   pantoprazole  40 mg Oral Daily   sodium chloride flush  3 mL Intravenous Q12H   Continuous  Infusions:   LOS: 0 days    Time spent: 30 minutes   Shelda Pal, DO [facility] 11/16/2021, 10:32 AM   Available via Epic secure chat 7am-7pm After these hours, please refer to coverage provider listed on [email or web address]

## 2021-11-17 DIAGNOSIS — C7951 Secondary malignant neoplasm of bone: Secondary | ICD-10-CM

## 2021-11-17 DIAGNOSIS — Z171 Estrogen receptor negative status [ER-]: Secondary | ICD-10-CM

## 2021-11-17 DIAGNOSIS — R29898 Other symptoms and signs involving the musculoskeletal system: Secondary | ICD-10-CM

## 2021-11-17 DIAGNOSIS — C50512 Malignant neoplasm of lower-outer quadrant of left female breast: Secondary | ICD-10-CM

## 2021-11-17 DIAGNOSIS — K2970 Gastritis, unspecified, without bleeding: Secondary | ICD-10-CM

## 2021-11-17 DIAGNOSIS — C7949 Secondary malignant neoplasm of other parts of nervous system: Secondary | ICD-10-CM | POA: Diagnosis not present

## 2021-11-17 LAB — COMPREHENSIVE METABOLIC PANEL
ALT: 50 U/L — ABNORMAL HIGH (ref 0–44)
AST: 28 U/L (ref 15–41)
Albumin: 3.6 g/dL (ref 3.5–5.0)
Alkaline Phosphatase: 121 U/L (ref 38–126)
Anion gap: 8 (ref 5–15)
BUN: 9 mg/dL (ref 6–20)
CO2: 24 mmol/L (ref 22–32)
Calcium: 9.2 mg/dL (ref 8.9–10.3)
Chloride: 103 mmol/L (ref 98–111)
Creatinine, Ser: 0.59 mg/dL (ref 0.44–1.00)
GFR, Estimated: >60 mL/min (ref >60)
Glucose, Bld: 202 mg/dL — ABNORMAL HIGH (ref 70–99)
Potassium: 4.7 mmol/L (ref 3.5–5.1)
Sodium: 135 mmol/L (ref 135–145)
Total Bilirubin: 0.2 mg/dL — ABNORMAL LOW (ref 0.3–1.2)
Total Protein: 7.8 g/dL (ref 6.5–8.1)

## 2021-11-17 LAB — CBC
HCT: 35.9 % — ABNORMAL LOW (ref 36.0–46.0)
Hemoglobin: 11.2 g/dL — ABNORMAL LOW (ref 12.0–15.0)
MCH: 32.6 pg (ref 26.0–34.0)
MCHC: 31.2 g/dL (ref 30.0–36.0)
MCV: 104.4 fL — ABNORMAL HIGH (ref 80.0–100.0)
Platelets: 191 10*3/uL (ref 150–400)
RBC: 3.44 MIL/uL — ABNORMAL LOW (ref 3.87–5.11)
RDW: 16.2 % — ABNORMAL HIGH (ref 11.5–15.5)
WBC: 6.7 10*3/uL (ref 4.0–10.5)
nRBC: 0 % (ref 0.0–0.2)

## 2021-11-17 MED ORDER — CAPECITABINE 500 MG PO TABS
1000.0000 mg | ORAL_TABLET | Freq: Two times a day (BID) | ORAL | Status: DC
  Administered 2021-11-18 – 2021-11-24 (×13): 1000 mg via ORAL

## 2021-11-17 MED ORDER — CHLORHEXIDINE GLUCONATE CLOTH 2 % EX PADS
6.0000 | MEDICATED_PAD | Freq: Every day | CUTANEOUS | Status: DC
  Administered 2021-11-17 – 2021-11-25 (×9): 6 via TOPICAL

## 2021-11-17 NOTE — Progress Notes (Signed)
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  Neurology and oncology were consulted.  Assessment & Plan:   Progressive lower extremity weakness Metastatic left breast cancer with extensive spinal metastasis/leptomeningeal carcinomatosis/hepatic metastases -Neurology recommended oncology input and possible neurosurgery evaluation after oncology input -Oncology following and patient has been on Decadron. -PT: Will need home health PT -We will consult palliative care for goals of care discussion -Continue pain management  Possible H. pylori gastritis -Was recently started on triple therapy as an outpatient.  Continue.  Outpatient follow-up  Anemia of chronic disease Macrocytosis -Hemoglobin stable.  Monitor intermittently.  Mildly elevated LFTs -ALT/AST mildly elevated: Improving.  Monitor intermittently.  Hyponatremia -Improved.  DVT prophylaxis: Lovenox Code Status: Full Family Communication: Mother/sister at bedside Disposition Plan: Status is: Inpatient Remains inpatient appropriate because: Of severity of illness.  Consultants: Neurology/oncology  Procedures: None  Antimicrobials:  Anti-infectives (From admission, onward)    Start     Dose/Rate Route Frequency Ordered Stop   11/14/21 2330  clarithromycin (BIAXIN) tablet 500 mg       Note to Pharmacy: For 14 days     500 mg Oral 2 times daily 11/14/21 2308     11/14/21 2315  metroNIDAZOLE (FLAGYL) tablet 500 mg        500 mg Oral 3 times daily 11/14/21 2308          Subjective: Patient seen and examined at bedside.  Mother helped with sign language  interpretation.  Patient still has weakness in lower extremities.  No overnight fever, seizures, vomiting reported.  Objective: Vitals:   11/16/21 0600 11/16/21 1225 11/16/21 2215 11/17/21 0514  BP: 116/74 111/70 125/73 130/82  Pulse: 79 86 93 75  Resp: '17  16 16  '$ Temp: 98.2 F (36.8 C) 98.6 F (37 C) 97.9 F (36.6 C) 97.7 F (36.5 C)  TempSrc: Oral Oral Oral Oral  SpO2: 97% 98% 99% 100%  Weight:      Height:        Intake/Output Summary (Last 24 hours) at 11/17/2021 1054 Last data filed at 11/17/2021 0904 Gross per 24 hour  Intake 240 ml  Output 2275 ml  Net -2035 ml   Filed Weights   11/14/21 1622  Weight: 54 kg    Examination:  General exam: Appears calm and comfortable.  No distress.  Currently on room air. Respiratory system: Bilateral decreased breath sounds at bases Cardiovascular system: S1 & S2 heard, Rate controlled Gastrointestinal system: Abdomen is distended slightly; soft and nontender.  Bowel sounds are heard. Extremities: No cyanosis, clubbing, edema  Central nervous system: Awake.  No obvious focal neurologic deficits.   Skin: No obvious ecchymosis/lesions Psychiatry: Affect is mostly flat.  No signs of agitation.  Data Reviewed: I have personally reviewed following labs and imaging studies  CBC: Recent Labs  Lab 11/14/21 1637 11/15/21 0530 11/16/21 0524 11/17/21 0459  WBC 9.7 7.7 5.5 6.7  NEUTROABS 7.7  --   --   --   HGB 12.1 10.6* 10.0* 11.2*  HCT 37.3 32.5* 31.3* 35.9*  MCV 102.2* 102.2* 102.3* 104.4*  PLT 206 195 173 007   Basic Metabolic Panel: Recent Labs  Lab 11/14/21 1637 11/15/21 0530 11/16/21 0524 11/17/21 0459  NA  136 135 134* 135  K 3.1* 4.6 3.6 4.7  CL 102 105 104 103  CO2 '24 24 23 24  '$ GLUCOSE 135* 112* 103* 202*  BUN '19 15 9 9  '$ CREATININE 0.69 0.68 0.58 0.59  CALCIUM 9.2 8.2* 8.4* 9.2  MG  --  2.3  --   --    GFR: Estimated Creatinine Clearance: 57.8 mL/min (by C-G formula based on SCr of 0.59 mg/dL). Liver  Function Tests: Recent Labs  Lab 11/14/21 1637 11/15/21 0530 11/16/21 0524 11/17/21 0459  AST 45* 43* 29 28  ALT 78* 56* 50* 50*  ALKPHOS 125 98 99 121  BILITOT 0.4 1.4* 0.5 0.2*  PROT 8.5* 7.1 7.0 7.8  ALBUMIN 4.0 3.4* 3.4* 3.6   No results for input(s): LIPASE, AMYLASE in the last 168 hours. No results for input(s): AMMONIA in the last 168 hours. Coagulation Profile: No results for input(s): INR, PROTIME in the last 168 hours. Cardiac Enzymes: No results for input(s): CKTOTAL, CKMB, CKMBINDEX, TROPONINI in the last 168 hours. BNP (last 3 results) No results for input(s): PROBNP in the last 8760 hours. HbA1C: No results for input(s): HGBA1C in the last 72 hours. CBG: No results for input(s): GLUCAP in the last 168 hours. Lipid Profile: No results for input(s): CHOL, HDL, LDLCALC, TRIG, CHOLHDL, LDLDIRECT in the last 72 hours. Thyroid Function Tests: No results for input(s): TSH, T4TOTAL, FREET4, T3FREE, THYROIDAB in the last 72 hours. Anemia Panel: Recent Labs    11/14/21 1622  VITAMINB12 6,159*  FOLATE 10.7   Sepsis Labs: No results for input(s): PROCALCITON, LATICACIDVEN in the last 168 hours.  Recent Results (from the past 240 hour(s))  Resp Panel by RT-PCR (Flu A&B, Covid) Nasopharyngeal Swab     Status: None   Collection Time: 11/14/21  9:10 PM   Specimen: Nasopharyngeal Swab; Nasopharyngeal(NP) swabs in vial transport medium  Result Value Ref Range Status   SARS Coronavirus 2 by RT PCR NEGATIVE NEGATIVE Final    Comment: (NOTE) SARS-CoV-2 target nucleic acids are NOT DETECTED.  The SARS-CoV-2 RNA is generally detectable in upper respiratory specimens during the acute phase of infection. The lowest concentration of SARS-CoV-2 viral copies this assay can detect is 138 copies/mL. A negative result does not preclude SARS-Cov-2 infection and should not be used as the sole basis for treatment or other patient management decisions. A negative result may occur  with  improper specimen collection/handling, submission of specimen other than nasopharyngeal swab, presence of viral mutation(s) within the areas targeted by this assay, and inadequate number of viral copies(<138 copies/mL). A negative result must be combined with clinical observations, patient history, and epidemiological information. The expected result is Negative.  Fact Sheet for Patients:  EntrepreneurPulse.com.au  Fact Sheet for Healthcare Providers:  IncredibleEmployment.be  This test is no t yet approved or cleared by the [place] FDA and  has been authorized for detection and/or diagnosis of SARS-CoV-2 by FDA under an Emergency Use Authorization (EUA). This EUA will remain  in effect (meaning this test can be used) for the duration of the COVID-19 declaration under Section 564(b)(1) of the Act, 21 U.S.C.section 360bbb-3(b)(1), unless the authorization is terminated  or revoked sooner.       Influenza A by PCR NEGATIVE NEGATIVE Final   Influenza B by PCR NEGATIVE NEGATIVE Final    Comment: (NOTE) The Xpert Xpress SARS-CoV-2/FLU/RSV plus assay is intended as an aid in the diagnosis of influenza from Nasopharyngeal swab specimens and should not be used as  a sole basis for treatment. Nasal washings and aspirates are unacceptable for Xpert Xpress SARS-CoV-2/FLU/RSV testing.  Fact Sheet for Patients: EntrepreneurPulse.com.au  Fact Sheet for Healthcare Providers: IncredibleEmployment.be  This test is not yet approved or cleared by the [place] FDA and has been authorized for detection and/or diagnosis of SARS-CoV-2 by FDA under an Emergency Use Authorization (EUA). This EUA will remain in effect (meaning this test can be used) for the duration of the COVID-19 declaration under Section 564(b)(1) of the Act, 21 U.S.C. section 360bbb-3(b)(1), unless the authorization is terminated  or revoked.  Performed at [facility], [place] [address] [place], [place] [address]          Radiology Studies: MR CERVICAL SPINE W WO CONTRAST  Result Date: 11/15/2021 CLINICAL DATA:  Provided history: Leptomeningeal metastases suspected; leptomeningeal disease of spine suspected. EXAM: MRI CERVICAL SPINE WITHOUT AND WITH CONTRAST TECHNIQUE: Multiplanar and multiecho pulse sequences of the cervical spine, to include the craniocervical junction and cervicothoracic junction, were obtained without and with intravenous contrast. CONTRAST:  55m GADAVIST GADOBUTROL 1 MMOL/ML IV SOLN COMPARISON:  Total spine MRI 08/03/2019. FINDINGS: Alignment: Straightening of the expected cervical lordosis. No significant spondylolisthesis. Vertebrae: Vertebral body height is maintained. No significant marrow edema or focal suspicious osseous lesion within the cervical spine. Cord: Redemonstrated foci of T2 hyperintense signal abnormality within the left aspect of the spinal cord at C4-C5 and C5-C6, likely reflecting myelomalacia and/or focal edema from compressive myelopathy. Posterior Fossa, vertebral arteries, paraspinal tissues: Posterior fossa incompletely assessed. Flow voids preserved within the imaged cervical vertebral arteries. Paraspinal soft tissues unremarkable. Disc levels: Cervical spondylosis is unchanged as compared to the recent prior MRI of 08/02/2021. Findings are most notably as follows. At C4-C5, there is mild disc degeneration. Disc bulge with bilateral uncovertebral hypertrophy. Superimposed broad-based central disc protrusion. Facet arthrosis/ligamentum flavum hypertrophy. Severe spinal canal stenosis with spinal cord flattening. T2 hyperintense signal abnormality within the left aspect of the spinal cord compatible with myelomalacia and/or focal edema. Moderate bilateral neural foraminal narrowing (greater on the right). At C5-C6, there is moderate disc degeneration. Disc bulge  with endplate spurring and bilateral uncovertebral hypertrophy. Superimposed broad-based central disc protrusion. Facet arthrosis and ligamentum flavum hypertrophy. Severe spinal canal stenosis with spinal cord flattening. T2 hyperintense signal abnormality within the left aspect of the spinal cord compatible with myelomalacia and/or focal edema. Moderate/severe bilateral neural foraminal narrowing. No definite leptomeningeal metastatic disease is identified within the cervical canal. IMPRESSION: No definite leptomeningeal metastatic disease is identified within the cervical spinal canal. Cervical spondylosis has not significant changed from the recent prior cervical spine MRI of 08/02/2021. Findings are most notably as follows. At C4-C5, there is multifactorial severe spinal canal stenosis with spinal cord flattening. T2 hyperintense signal abnormality within the left aspect of the spinal cord compatible with myelomalacia and/or focal edema. Moderate bilateral neural foraminal narrowing. At C5-C6, there is multifactorial severe spinal canal stenosis with spinal cord flattening. T2 hyperintense signal abnormality within the left aspect of the spinal cord compatible with myelomalacia and/or focal edema. Moderate/severe bilateral neural foraminal narrowing. Electronically Signed   By: KKellie SimmeringD.O.   On: 11/15/2021 13:13   MR THORACIC SPINE W WO CONTRAST  Result Date: 11/15/2021 CLINICAL DATA:  Left a meningeal metastases suspected. EXAM: MRI THORACIC WITHOUT AND WITH CONTRAST TECHNIQUE: Multiplanar and multiecho pulse sequences of the thoracic spine were obtained without and with intravenous contrast. CONTRAST:  561mGADAVIST GADOBUTROL 1 MMOL/ML IV SOLN COMPARISON:  Total  spine MRI 08/02/2021. FINDINGS: Alignment:  No significant spondylolisthesis. Vertebrae: Similar appearance of multifocal osseous metastatic disease within the thoracic spine. Unchanged mild chronic superior endplate deformities and chronic  height loss at T3, T5 and T6. Vertebral body height is otherwise maintained within the thoracic spine. Cord: No signal abnormality identified within the spinal cord. There is extensive abnormal enhancement along the distal aspect of the spinal cord compatible (extending inferiorly from the T9 level), progressed from the prior total spine MRI of 08/02/2021 and compatible with leptomeningeal metastatic disease. Paraspinal and other soft tissues: No paraspinal soft tissue mass. Disc levels: Unchanged thoracic spondylosis. No significant spinal canal stenosis. No significant neural foraminal narrowing. IMPRESSION: Extensive abnormal enhancement along the distal aspect of the spinal cord (extending inferiorly from the T9 level), progressed from the prior total spine MRI of 08/02/2021 and compatible with leptomeningeal metastatic disease. Multifocal osseous metastatic disease within the thoracic spine is similar as compared to the prior MRI. Unchanged mild T3, T5 and T6 compression deformities. Superimposed thoracic spondylosis. No significant spinal canal or foraminal stenosis. Electronically Signed   By: Kellie Simmering D.O.   On: 11/15/2021 13:30   MR LUMBAR SPINE W WO CONTRAST  Result Date: 11/15/2021 CLINICAL DATA:  Leptomeningeal metastases suspected. EXAM: MRI LUMBAR SPINE WITHOUT AND WITH CONTRAST TECHNIQUE: Multiplanar and multiecho pulse sequences of the lumbar spine were obtained without and with intravenous contrast. CONTRAST:  9m GADAVIST GADOBUTROL 1 MMOL/ML IV SOLN COMPARISON:  Total spine MRI 08/02/2021. FINDINGS: Segmentation: 5 lumbar vertebrae. The caudal most well-formed interval disc space is designated L5-S1. Alignment: Unchanged 4 mm bony retropulsion at the level of the L3 superior endplate. Vertebrae: Multifocal osseous metastatic disease throughout the lumbar spine, similar in appearance as compared to the recent prior MRI of 08/02/2021. There are osseous metastases within the S1 and S2 bodies  which appear new from the prior MRI. Unchanged height loss at site of an L3 pathologic vertebral compression fracture. Vertebral body height is otherwise maintained. Osseous metastatic disease is also present within the partially imaged right iliac bone. Conus medullaris and cauda equina: Conus extends to the L2 level. Significantly for progressed from the prior examination, there is extensive abnormal enhancement along the visualized distal spinal cord and cauda equina nerve roots compatible with leptomeningeal metastatic disease. Paraspinal and other soft tissues: No paraspinal soft tissue mass. Disc levels: At L2-L3, bony retropulsion, a disc bulge, facet arthrosis and ligamentum flavum hypertrophy contribute to mild to moderate spinal canal narrowing, unchanged. Mild bilateral neural foraminal narrowing also present at this level. Lumbar spondylosis is otherwise unchanged as compared to the recent prior MRI of 08/02/2021. No significant spinal canal stenosis, and no more than mild neural foraminal narrowing, at the remaining levels. IMPRESSION: Extensive abnormal enhancement along the visualized distal spinal cord and cauda equina nerve roots, progressed from the recent prior MRI of 08/02/2021, and compatible with leptomeningeal metastatic disease. Unchanged height loss at site of an L3 pathologic compression fracture. As before, bony retropulsion and spondylosis at L2-L3 resulted in mild-to-moderate spinal canal narrowing, and mild bilateral neural foraminal narrowing. Lumbar spondylosis is otherwise unchanged. No significant spinal canal stenosis, and no more than mild neural foraminal narrowing, at the remaining levels. Multifocal osseous metastatic disease within the lumbar spine appears similar to the prior MRI. There are osseous metastases within the S1 and S2 bodies, which appear new from this prior exam. Osseous metastatic disease is also present within the partially imaged right iliac bone.  Electronically Signed   By:  Kellie Simmering D.O.   On: 11/15/2021 13:21        Scheduled Meds:  clarithromycin  500 mg Oral BID   dexamethasone (DECADRON) injection  4 mg Intravenous Q6H   enoxaparin (LOVENOX) injection  40 mg Subcutaneous Q24H   famotidine  20 mg Oral BID   gabapentin  100 mg Oral TID   memantine  10 mg Oral BID   metroNIDAZOLE  500 mg Oral TID   pantoprazole  40 mg Oral Daily   sodium chloride flush  3 mL Intravenous Q12H   Continuous Infusions:        Aline August, MD [facility] 11/17/2021, 10:54 AM

## 2021-11-17 NOTE — Progress Notes (Signed)
IP PROGRESS NOTE  Subjective:   The patient's mother is at the bedside.  The patient continues to have lower extremity weakness.  She is able to move her legs in the bed and can lift them.  She is unable to stand however.  Left leg is weaker than the right.  Has been evaluated by neurology who recommends neurosurgery evaluation.  Objective: Vital signs in last 24 hours: Blood pressure 130/82, pulse 75, temperature 97.7 F (36.5 C), temperature source Oral, resp. rate 16, height '5\' 1"'$  (1.549 m), weight 54 kg, last menstrual period 07/09/2017, SpO2 100 %.  Intake/Output from previous day: 03/05 0701 - 03/06 0700 In: -  Out: 2125 [Urine:2125]  Physical Exam:  HEENT: No thrush Extremities: No leg edema Neurologic: Alert, the motor exam appears intact in the upper extremities bilaterally.  Decreased strength with leg raise bilaterally.  The motor exam appears intact at the feet aside from 4/5 strength with dorsi flexion at the left foot sensation in tact to light touch at the legs  Lab Results: Recent Labs    11/16/21 0524 11/17/21 0459  WBC 5.5 6.7  HGB 10.0* 11.2*  HCT 31.3* 35.9*  PLT 173 191    BMET Recent Labs    11/16/21 0524 11/17/21 0459  NA 134* 135  K 3.6 4.7  CL 104 103  CO2 23 24  GLUCOSE 103* 202*  BUN 9 9  CREATININE 0.58 0.59  CALCIUM 8.4* 9.2    No results found for: CEA1, CEA, [id number], CA125  Studies/Results: MR CERVICAL SPINE W WO CONTRAST  Result Date: 11/15/2021 CLINICAL DATA:  Provided history: Leptomeningeal metastases suspected; leptomeningeal disease of spine suspected. EXAM: MRI CERVICAL SPINE WITHOUT AND WITH CONTRAST TECHNIQUE: Multiplanar and multiecho pulse sequences of the cervical spine, to include the craniocervical junction and cervicothoracic junction, were obtained without and with intravenous contrast. CONTRAST:  61m GADAVIST GADOBUTROL 1 MMOL/ML IV SOLN COMPARISON:  Total spine MRI 08/03/2019. FINDINGS: Alignment: Straightening of the  expected cervical lordosis. No significant spondylolisthesis. Vertebrae: Vertebral body height is maintained. No significant marrow edema or focal suspicious osseous lesion within the cervical spine. Cord: Redemonstrated foci of T2 hyperintense signal abnormality within the left aspect of the spinal cord at C4-C5 and C5-C6, likely reflecting myelomalacia and/or focal edema from compressive myelopathy. Posterior Fossa, vertebral arteries, paraspinal tissues: Posterior fossa incompletely assessed. Flow voids preserved within the imaged cervical vertebral arteries. Paraspinal soft tissues unremarkable. Disc levels: Cervical spondylosis is unchanged as compared to the recent prior MRI of 08/02/2021. Findings are most notably as follows. At C4-C5, there is mild disc degeneration. Disc bulge with bilateral uncovertebral hypertrophy. Superimposed broad-based central disc protrusion. Facet arthrosis/ligamentum flavum hypertrophy. Severe spinal canal stenosis with spinal cord flattening. T2 hyperintense signal abnormality within the left aspect of the spinal cord compatible with myelomalacia and/or focal edema. Moderate bilateral neural foraminal narrowing (greater on the right). At C5-C6, there is moderate disc degeneration. Disc bulge with endplate spurring and bilateral uncovertebral hypertrophy. Superimposed broad-based central disc protrusion. Facet arthrosis and ligamentum flavum hypertrophy. Severe spinal canal stenosis with spinal cord flattening. T2 hyperintense signal abnormality within the left aspect of the spinal cord compatible with myelomalacia and/or focal edema. Moderate/severe bilateral neural foraminal narrowing. No definite leptomeningeal metastatic disease is identified within the cervical canal. IMPRESSION: No definite leptomeningeal metastatic disease is identified within the cervical spinal canal. Cervical spondylosis has not significant changed from the recent prior cervical spine MRI of 08/02/2021.  Findings are most notably as follows. At  C4-C5, there is multifactorial severe spinal canal stenosis with spinal cord flattening. T2 hyperintense signal abnormality within the left aspect of the spinal cord compatible with myelomalacia and/or focal edema. Moderate bilateral neural foraminal narrowing. At C5-C6, there is multifactorial severe spinal canal stenosis with spinal cord flattening. T2 hyperintense signal abnormality within the left aspect of the spinal cord compatible with myelomalacia and/or focal edema. Moderate/severe bilateral neural foraminal narrowing. Electronically Signed   By: Kellie Simmering D.O.   On: 11/15/2021 13:13   MR THORACIC SPINE W WO CONTRAST  Result Date: 11/15/2021 CLINICAL DATA:  Left a meningeal metastases suspected. EXAM: MRI THORACIC WITHOUT AND WITH CONTRAST TECHNIQUE: Multiplanar and multiecho pulse sequences of the thoracic spine were obtained without and with intravenous contrast. CONTRAST:  37m GADAVIST GADOBUTROL 1 MMOL/ML IV SOLN COMPARISON:  Total spine MRI 08/02/2021. FINDINGS: Alignment:  No significant spondylolisthesis. Vertebrae: Similar appearance of multifocal osseous metastatic disease within the thoracic spine. Unchanged mild chronic superior endplate deformities and chronic height loss at T3, T5 and T6. Vertebral body height is otherwise maintained within the thoracic spine. Cord: No signal abnormality identified within the spinal cord. There is extensive abnormal enhancement along the distal aspect of the spinal cord compatible (extending inferiorly from the T9 level), progressed from the prior total spine MRI of 08/02/2021 and compatible with leptomeningeal metastatic disease. Paraspinal and other soft tissues: No paraspinal soft tissue mass. Disc levels: Unchanged thoracic spondylosis. No significant spinal canal stenosis. No significant neural foraminal narrowing. IMPRESSION: Extensive abnormal enhancement along the distal aspect of the spinal cord (extending  inferiorly from the T9 level), progressed from the prior total spine MRI of 08/02/2021 and compatible with leptomeningeal metastatic disease. Multifocal osseous metastatic disease within the thoracic spine is similar as compared to the prior MRI. Unchanged mild T3, T5 and T6 compression deformities. Superimposed thoracic spondylosis. No significant spinal canal or foraminal stenosis. Electronically Signed   By: KKellie SimmeringD.O.   On: 11/15/2021 13:30   MR LUMBAR SPINE W WO CONTRAST  Result Date: 11/15/2021 CLINICAL DATA:  Leptomeningeal metastases suspected. EXAM: MRI LUMBAR SPINE WITHOUT AND WITH CONTRAST TECHNIQUE: Multiplanar and multiecho pulse sequences of the lumbar spine were obtained without and with intravenous contrast. CONTRAST:  523mGADAVIST GADOBUTROL 1 MMOL/ML IV SOLN COMPARISON:  Total spine MRI 08/02/2021. FINDINGS: Segmentation: 5 lumbar vertebrae. The caudal most well-formed interval disc space is designated L5-S1. Alignment: Unchanged 4 mm bony retropulsion at the level of the L3 superior endplate. Vertebrae: Multifocal osseous metastatic disease throughout the lumbar spine, similar in appearance as compared to the recent prior MRI of 08/02/2021. There are osseous metastases within the S1 and S2 bodies which appear new from the prior MRI. Unchanged height loss at site of an L3 pathologic vertebral compression fracture. Vertebral body height is otherwise maintained. Osseous metastatic disease is also present within the partially imaged right iliac bone. Conus medullaris and cauda equina: Conus extends to the L2 level. Significantly for progressed from the prior examination, there is extensive abnormal enhancement along the visualized distal spinal cord and cauda equina nerve roots compatible with leptomeningeal metastatic disease. Paraspinal and other soft tissues: No paraspinal soft tissue mass. Disc levels: At L2-L3, bony retropulsion, a disc bulge, facet arthrosis and ligamentum flavum  hypertrophy contribute to mild to moderate spinal canal narrowing, unchanged. Mild bilateral neural foraminal narrowing also present at this level. Lumbar spondylosis is otherwise unchanged as compared to the recent prior MRI of 08/02/2021. No significant spinal canal stenosis, and no more  than mild neural foraminal narrowing, at the remaining levels. IMPRESSION: Extensive abnormal enhancement along the visualized distal spinal cord and cauda equina nerve roots, progressed from the recent prior MRI of 08/02/2021, and compatible with leptomeningeal metastatic disease. Unchanged height loss at site of an L3 pathologic compression fracture. As before, bony retropulsion and spondylosis at L2-L3 resulted in mild-to-moderate spinal canal narrowing, and mild bilateral neural foraminal narrowing. Lumbar spondylosis is otherwise unchanged. No significant spinal canal stenosis, and no more than mild neural foraminal narrowing, at the remaining levels. Multifocal osseous metastatic disease within the lumbar spine appears similar to the prior MRI. There are osseous metastases within the S1 and S2 bodies, which appear new from this prior exam. Osseous metastatic disease is also present within the partially imaged right iliac bone. Electronically Signed   By: Kellie Simmering D.O.   On: 11/15/2021 13:21    Medications: I have reviewed the patient's current medications.  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 fall 2022 Leg weakness/numbness secondary to #2 MRI lumbar spine 11/15/2021 with abnormal enhancement at the distal spinal cord and cauda equina consistent with leptomeningeal metastatic disease, multifocal osseous metastatic disease, L3 pathologic pression fracture with bony retropulsion and mild to  moderate spinal canal narrowing MRI thoracic spine 11/15/2021-abnormal enhancement at the distal spinal cord extending inferiorly from T9, multifocal osseous metastatic disease MRI cervical spine 11/15/2021-thigh focal severe spinal canal stenosis with spinal cord flattening at C4-5 and C5-6  -Ms. Scarantino appears unchanged.  She has known leptomeningeal disease.  She has been on systemic treatment which is currently on hold due to nausea and vomiting.  We plan to resume treatment following hospital discharge.  Life expectancy with treatment is estimated to be 6 to 12 months. -Continue trial of Decadron -Would recommend for neurosurgery evaluation as recommended by neurology.  If surgery is not recommended, would recommend radiation oncology consult. -She was recently diagnosed with H. pylori gastritis.  She has been started on clarithromycin and Flagyl for 14 days.  Recommend for her to continue these medications.   LOS: 1 day   Mikey Bussing, NP   11/17/2021, 12:29 PM

## 2021-11-17 NOTE — Progress Notes (Signed)
Patient ID: Erin [name], female   DOB: Jul 27, 1961, 61 y.o.   MRN: [id number] ?BP 121/75 (BP Location: Right Arm)   Pulse 100   Temp 98.6 ?F (37 ?C) (Oral)   Resp 16   Ht '5\' 1"'$  (1.549 m)   Wt 54 kg   LMP 07/09/2017 (LMP Unknown)   SpO2 97%   BMI 22.48 kg/m?  ?I have reviewed the films and notes. There is no reasonable surgical option available. The leptomeningeal disease accounts for the weakness. The compression fracture in the lumbar spine is unchanged. Her life expectancy does not warrant any spine surgery. Given widespread spinal disease a kyphoplasty I believe is not warranted. She is not a surgical  candidate. There are no operative solutions.  ?

## 2021-11-17 NOTE — Progress Notes (Signed)
I used interpreter to have this conversation. ?Patient's mom was in the room as well. ?I discussed the results of the recently performed MRIs. ?I discussed with her that clearly there is worsening of leptomeningeal carcinomatosis.  It is difficult to determine if the worsening is related to her inadequate treatment lately because of difficulties with swallowing or whether the cancer is truly progressing. ? ?I informed her that without any further treatment and her life expectancy is in the matter of a few months. ? ?She was extremely shocked to hear that and reinforced to me that she wants to continue to live as long as a possible and therefore she will consider taking the treatments that were being given prior to her swallowing issues. ? ?Starting tomorrow I would like her to receive Xeloda 1000 mg p.o. twice daily and Tucatinib 300 mg p.o. twice daily ?She will bring her own supply and will take it starting tomorrow. ? ?Disposition: Patient will need to go to to rehab/nursing home to get stronger.  She will need to continue with her oral chemo drugs while at the nursing home. ?She will bring her own supply to be taken in the hospital and at the nursing home. ?

## 2021-11-18 DIAGNOSIS — C7951 Secondary malignant neoplasm of bone: Secondary | ICD-10-CM | POA: Diagnosis not present

## 2021-11-18 DIAGNOSIS — R29898 Other symptoms and signs involving the musculoskeletal system: Secondary | ICD-10-CM | POA: Diagnosis not present

## 2021-11-18 DIAGNOSIS — K2970 Gastritis, unspecified, without bleeding: Secondary | ICD-10-CM | POA: Diagnosis not present

## 2021-11-18 DIAGNOSIS — C7949 Secondary malignant neoplasm of other parts of nervous system: Secondary | ICD-10-CM | POA: Diagnosis not present

## 2021-11-18 LAB — VITAMIN B1: Vitamin B1 (Thiamine): 159.8 nmol/L (ref 66.5–200.0)

## 2021-11-18 MED ORDER — POLYETHYLENE GLYCOL 3350 17 G PO PACK
17.0000 g | PACK | Freq: Every day | ORAL | Status: DC | PRN
  Administered 2021-11-18 – 2021-11-19 (×2): 17 g via ORAL
  Filled 2021-11-18 (×2): qty 1

## 2021-11-18 MED ORDER — TUCATINIB 150 MG PO TABS
300.0000 mg | ORAL_TABLET | Freq: Two times a day (BID) | ORAL | Status: DC
  Administered 2021-11-18 – 2021-11-25 (×14): 300 mg via ORAL

## 2021-11-18 MED ORDER — SENNOSIDES-DOCUSATE SODIUM 8.6-50 MG PO TABS
1.0000 | ORAL_TABLET | Freq: Two times a day (BID) | ORAL | Status: DC
  Administered 2021-11-18 – 2021-11-25 (×15): 1 via ORAL
  Filled 2021-11-18 (×15): qty 1

## 2021-11-18 MED ORDER — BISACODYL 10 MG RE SUPP
10.0000 mg | Freq: Every day | RECTAL | Status: DC | PRN
Start: 2021-11-18 — End: 2021-11-25

## 2021-11-18 NOTE — Progress Notes (Signed)
PROGRESS NOTE    Erin [name]  DJM:[id number] DOB: Jun 12, 1961 DOA: 11/14/2021 PCP: [name], NP   Brief Narrative:  61 year old female with history of deafness communicating through American sign language, metastatic left-sided breast cancer with extensive spinal metastasis, hepatic metastasis, leptomeningeal carcinomatosis presented with progressive lower extremity weakness and falls.  Neurology and oncology were consulted.  Assessment & Plan:   Progressive lower extremity weakness Metastatic left breast cancer with extensive spinal metastasis/leptomeningeal carcinomatosis/hepatic metastases -Neurology recommended oncology input and possible neurosurgery evaluation after oncology input.  I had communicated with Dr. Cabbell/neurosurgery on phone on 11/17/2021: He reviewed patient's chart and imaging and apparently patient is not a neurosurgical candidate. -Oncology following and patient has been on Decadron.  Oncology has resumed patient's oral chemotherapy regimen: Patient will be taking her home supply of these medications.  Oncology thinks that patient would benefit from rehab placement.  We will ask PT to reevaluate the patient. -consult palliative care for goals of care discussion -Continue pain management  Possible H. pylori gastritis -Was recently started on triple therapy as an outpatient.  Continue.  Outpatient follow-up  Anemia of chronic disease Macrocytosis -Hemoglobin stable.  Monitor intermittently.  Mildly elevated LFTs -ALT/AST mildly elevated: Improving.  Monitor intermittently.  Hyponatremia -Improved.  DVT prophylaxis: Lovenox Code Status: Full Family Communication: Mother at bedside.  Communicated with the patient via sign interpreter/Kathy Disposition Plan: Status is: Inpatient Remains inpatient appropriate because: Of severity of illness.  Will need PT eval.  Consultants: Neurology/oncology.  Palliative care.  Discussed with neurosurgery as  well.  Procedures: None  Antimicrobials:  Anti-infectives (From admission, onward)    Start     Dose/Rate Route Frequency Ordered Stop   11/14/21 2330  clarithromycin (BIAXIN) tablet 500 mg       Note to Pharmacy: For 14 days     500 mg Oral 2 times daily 11/14/21 2308     11/14/21 2315  metroNIDAZOLE (FLAGYL) tablet 500 mg        500 mg Oral 3 times daily 11/14/21 2308          Subjective: Patient seen and examined at bedside.  Communicated via sign interpreter at bedside.  Still feels weak in her lower extremities but improving.  Has some constipation.   Objective: Vitals:   11/17/21 0514 11/17/21 1346 11/17/21 2137 11/18/21 0606  BP: 130/82 121/75 138/83 132/87  Pulse: 75 100 91 87  Resp: '16 16  17  '$ Temp: 97.7 F (36.5 C) 98.6 F (37 C) 97.6 F (36.4 C) 97.9 F (36.6 C)  TempSrc: Oral Oral Oral Oral  SpO2: 100% 97% 100% 100%  Weight:      Height:        Intake/Output Summary (Last 24 hours) at 11/18/2021 1034 Last data filed at 11/17/2021 2318 Gross per 24 hour  Intake 480 ml  Output 1175 ml  Net -695 ml    Filed Weights   11/14/21 1622  Weight: 54 kg    Examination:  General exam: On room air.  No acute distress.   Respiratory system: Bilateral decreased breath sounds at bases with some scattered crackles cardiovascular system: Currently rate controlled; S1-S2 heard  gastrointestinal system: Abdomen is mildly distended; soft and nontender.  Normal bowel sounds heard  extremities: Trace lower extremity edema; no clubbing  Central nervous system: Alert and awake.  No focal neurologic deficit.  Moving extremities; weak in the lower extremities. Skin: No obvious petechiae/rashes  psychiatry: Currently not agitated.  Mostly flat affect.  Data Reviewed: I have personally reviewed following labs and imaging studies  CBC: Recent Labs  Lab 11/14/21 1637 11/15/21 0530 11/16/21 0524 11/17/21 0459  WBC 9.7 7.7 5.5 6.7  NEUTROABS 7.7  --   --   --   HGB 12.1  10.6* 10.0* 11.2*  HCT 37.3 32.5* 31.3* 35.9*  MCV 102.2* 102.2* 102.3* 104.4*  PLT 206 195 173 213    Basic Metabolic Panel: Recent Labs  Lab 11/14/21 1637 11/15/21 0530 11/16/21 0524 11/17/21 0459  NA 136 135 134* 135  K 3.1* 4.6 3.6 4.7  CL 102 105 104 103  CO2 '24 24 23 24  '$ GLUCOSE 135* 112* 103* 202*  BUN '19 15 9 9  '$ CREATININE 0.69 0.68 0.58 0.59  CALCIUM 9.2 8.2* 8.4* 9.2  MG  --  2.3  --   --     GFR: Estimated Creatinine Clearance: 57.8 mL/min (by C-G formula based on SCr of 0.59 mg/dL). Liver Function Tests: Recent Labs  Lab 11/14/21 1637 11/15/21 0530 11/16/21 0524 11/17/21 0459  AST 45* 43* 29 28  ALT 78* 56* 50* 50*  ALKPHOS 125 98 99 121  BILITOT 0.4 1.4* 0.5 0.2*  PROT 8.5* 7.1 7.0 7.8  ALBUMIN 4.0 3.4* 3.4* 3.6    No results for input(s): LIPASE, AMYLASE in the last 168 hours. No results for input(s): AMMONIA in the last 168 hours. Coagulation Profile: No results for input(s): INR, PROTIME in the last 168 hours. Cardiac Enzymes: No results for input(s): CKTOTAL, CKMB, CKMBINDEX, TROPONINI in the last 168 hours. BNP (last 3 results) No results for input(s): PROBNP in the last 8760 hours. HbA1C: No results for input(s): HGBA1C in the last 72 hours. CBG: No results for input(s): GLUCAP in the last 168 hours. Lipid Profile: No results for input(s): CHOL, HDL, LDLCALC, TRIG, CHOLHDL, LDLDIRECT in the last 72 hours. Thyroid Function Tests: No results for input(s): TSH, T4TOTAL, FREET4, T3FREE, THYROIDAB in the last 72 hours. Anemia Panel: No results for input(s): VITAMINB12, FOLATE, FERRITIN, TIBC, IRON, RETICCTPCT in the last 72 hours.  Sepsis Labs: No results for input(s): PROCALCITON, LATICACIDVEN in the last 168 hours.  Recent Results (from the past 240 hour(s))  Resp Panel by RT-PCR (Flu A&B, Covid) Nasopharyngeal Swab     Status: None   Collection Time: 11/14/21  9:10 PM   Specimen: Nasopharyngeal Swab; Nasopharyngeal(NP) swabs in vial  transport medium  Result Value Ref Range Status   SARS Coronavirus 2 by RT PCR NEGATIVE NEGATIVE Final    Comment: (NOTE) SARS-CoV-2 target nucleic acids are NOT DETECTED.  The SARS-CoV-2 RNA is generally detectable in upper respiratory specimens during the acute phase of infection. The lowest concentration of SARS-CoV-2 viral copies this assay can detect is 138 copies/mL. A negative result does not preclude SARS-Cov-2 infection and should not be used as the sole basis for treatment or other patient management decisions. A negative result may occur with  improper specimen collection/handling, submission of specimen other than nasopharyngeal swab, presence of viral mutation(s) within the areas targeted by this assay, and inadequate number of viral copies(<138 copies/mL). A negative result must be combined with clinical observations, patient history, and epidemiological information. The expected result is Negative.  Fact Sheet for Patients:  EntrepreneurPulse.com.au  Fact Sheet for Healthcare Providers:  IncredibleEmployment.be  This test is no t yet approved or cleared by the [place] FDA and  has been authorized for detection and/or diagnosis of SARS-CoV-2 by FDA under an Emergency Use Authorization (EUA). This  EUA will remain  in effect (meaning this test can be used) for the duration of the COVID-19 declaration under Section 564(b)(1) of the Act, 21 U.S.C.section 360bbb-3(b)(1), unless the authorization is terminated  or revoked sooner.       Influenza A by PCR NEGATIVE NEGATIVE Final   Influenza B by PCR NEGATIVE NEGATIVE Final    Comment: (NOTE) The Xpert Xpress SARS-CoV-2/FLU/RSV plus assay is intended as an aid in the diagnosis of influenza from Nasopharyngeal swab specimens and should not be used as a sole basis for treatment. Nasal washings and aspirates are unacceptable for Xpert Xpress SARS-CoV-2/FLU/RSV testing.  Fact  Sheet for Patients: EntrepreneurPulse.com.au  Fact Sheet for Healthcare Providers: IncredibleEmployment.be  This test is not yet approved or cleared by the [place] FDA and has been authorized for detection and/or diagnosis of SARS-CoV-2 by FDA under an Emergency Use Authorization (EUA). This EUA will remain in effect (meaning this test can be used) for the duration of the COVID-19 declaration under Section 564(b)(1) of the Act, 21 U.S.C. section 360bbb-3(b)(1), unless the authorization is terminated or revoked.  Performed at [facility], [place] [address] [place], [place] [address]           Radiology Studies: No results found.      Scheduled Meds:  capecitabine  1,000 mg Oral BID PC   Chlorhexidine Gluconate Cloth  6 each Topical Daily   clarithromycin  500 mg Oral BID   dexamethasone (DECADRON) injection  4 mg Intravenous Q6H   enoxaparin (LOVENOX) injection  40 mg Subcutaneous Q24H   famotidine  20 mg Oral BID   gabapentin  100 mg Oral TID   memantine  10 mg Oral BID   metroNIDAZOLE  500 mg Oral TID   pantoprazole  40 mg Oral Daily   sodium chloride flush  3 mL Intravenous Q12H   tucatinib  300 mg Oral BID   Continuous Infusions:        Aline August, MD [facility] 11/18/2021, 10:34 AM

## 2021-11-19 DIAGNOSIS — C7949 Secondary malignant neoplasm of other parts of nervous system: Secondary | ICD-10-CM | POA: Diagnosis not present

## 2021-11-19 DIAGNOSIS — K2970 Gastritis, unspecified, without bleeding: Secondary | ICD-10-CM | POA: Diagnosis not present

## 2021-11-19 DIAGNOSIS — C7951 Secondary malignant neoplasm of bone: Secondary | ICD-10-CM | POA: Diagnosis not present

## 2021-11-19 DIAGNOSIS — R29898 Other symptoms and signs involving the musculoskeletal system: Secondary | ICD-10-CM | POA: Diagnosis not present

## 2021-11-19 NOTE — Progress Notes (Signed)
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  Neurology and oncology were consulted.  Assessment & Plan:   Progressive lower extremity weakness Metastatic left breast cancer with extensive spinal metastasis/leptomeningeal carcinomatosis/hepatic metastases -Neurology recommended oncology input and possible neurosurgery evaluation after oncology input.  I had communicated with Dr. Cabbell/neurosurgery on phone on 11/17/2021: He reviewed patient's chart and imaging and apparently patient is not a neurosurgical candidate. -Oncology following and patient has been on Decadron.  Oncology has resumed patient's oral chemotherapy regimen: Patient will be taking her home supply of these medications.  Oncology thinks that patient would benefit from rehab placement.  PT reevaluation pending. -Palliative care consultation pending. -Continue pain management  Possible H. pylori gastritis -Was recently started on triple therapy as an outpatient.  Continue.  Outpatient follow-up  Anemia of chronic disease Macrocytosis -Hemoglobin stable.  Monitor intermittently.  Mildly elevated LFTs -ALT/AST mildly elevated: Improving.  Monitor intermittently.  Hyponatremia -Improved.  DVT prophylaxis: Lovenox Code Status: Full Family Communication: Mother at bedside.  Communicated with the patient via sign interpreter/Mark Disposition Plan: Status is: Inpatient Remains inpatient appropriate because: Of severity of illness.  Will need PT reeval.  Consultants: Neurology/oncology.  Palliative care.  Discussed with neurosurgery as well.  Procedures:  None  Antimicrobials:  Anti-infectives (From admission, onward)    Start     Dose/Rate Route Frequency Ordered Stop   11/14/21 2330  clarithromycin (BIAXIN) tablet 500 mg       Note to Pharmacy: For 14 days     500 mg Oral 2 times daily 11/14/21 2308     11/14/21 2315  metroNIDAZOLE (FLAGYL) tablet 500 mg        500 mg Oral 3 times daily 11/14/21 2308          Subjective: Patient seen and examined at bedside.  Communicated via sign interpreter at bedside.  No fever, vomiting, worsening shortness of breath reported.  Still weak in the lower extremities.   Objective: Vitals:   11/18/21 0606 11/18/21 1331 11/18/21 2135 11/19/21 0339  BP: 132/87 129/78 137/72 133/87  Pulse: 87 (!) 102 89 89  Resp: '17 17 16 17  '$ Temp: 97.9 F (36.6 C) 98.4 F (36.9 C) 97.7 F (36.5 C) 97.7 F (36.5 C)  TempSrc: Oral Oral Oral Oral  SpO2: 100% 100% 99% 99%  Weight:      Height:        Intake/Output Summary (Last 24 hours) at 11/19/2021 0823 Last data filed at 11/19/2021 0300 Gross per 24 hour  Intake 480 ml  Output 2300 ml  Net -1820 ml    Filed Weights   11/14/21 1622  Weight: 54 kg    Examination:  General exam: No distress.  On room air currently. Respiratory system: Decreased breath sounds at bases bilaterally, no wheezing cardiovascular system: S1-S2 heard; rate controlled currently gastrointestinal system: Abdomen is mildly distended; soft and nontender.  Bowel sounds are heard  extremities: No cyanosis; mild lower extremity edema. Central nervous system: Awake and oriented.  No obvious focal deficits.  Moving extremities; still weak in the lower extremities. Skin: No obvious ecchymosis/lesions psychiatry: Affect is mostly flat.  No signs of agitation currently.  Data Reviewed: I have personally reviewed following  labs and imaging studies  CBC: Recent Labs  Lab 11/14/21 1637 11/15/21 0530 11/16/21 0524 11/17/21 0459  WBC 9.7 7.7 5.5 6.7  NEUTROABS 7.7  --   --   --    HGB 12.1 10.6* 10.0* 11.2*  HCT 37.3 32.5* 31.3* 35.9*  MCV 102.2* 102.2* 102.3* 104.4*  PLT 206 195 173 099    Basic Metabolic Panel: Recent Labs  Lab 11/14/21 1637 11/15/21 0530 11/16/21 0524 11/17/21 0459  NA 136 135 134* 135  K 3.1* 4.6 3.6 4.7  CL 102 105 104 103  CO2 '24 24 23 24  '$ GLUCOSE 135* 112* 103* 202*  BUN '19 15 9 9  '$ CREATININE 0.69 0.68 0.58 0.59  CALCIUM 9.2 8.2* 8.4* 9.2  MG  --  2.3  --   --     GFR: Estimated Creatinine Clearance: 57.8 mL/min (by C-G formula based on SCr of 0.59 mg/dL). Liver Function Tests: Recent Labs  Lab 11/14/21 1637 11/15/21 0530 11/16/21 0524 11/17/21 0459  AST 45* 43* 29 28  ALT 78* 56* 50* 50*  ALKPHOS 125 98 99 121  BILITOT 0.4 1.4* 0.5 0.2*  PROT 8.5* 7.1 7.0 7.8  ALBUMIN 4.0 3.4* 3.4* 3.6    No results for input(s): LIPASE, AMYLASE in the last 168 hours. No results for input(s): AMMONIA in the last 168 hours. Coagulation Profile: No results for input(s): INR, PROTIME in the last 168 hours. Cardiac Enzymes: No results for input(s): CKTOTAL, CKMB, CKMBINDEX, TROPONINI in the last 168 hours. BNP (last 3 results) No results for input(s): PROBNP in the last 8760 hours. HbA1C: No results for input(s): HGBA1C in the last 72 hours. CBG: No results for input(s): GLUCAP in the last 168 hours. Lipid Profile: No results for input(s): CHOL, HDL, LDLCALC, TRIG, CHOLHDL, LDLDIRECT in the last 72 hours. Thyroid Function Tests: No results for input(s): TSH, T4TOTAL, FREET4, T3FREE, THYROIDAB in the last 72 hours. Anemia Panel: No results for input(s): VITAMINB12, FOLATE, FERRITIN, TIBC, IRON, RETICCTPCT in the last 72 hours.  Sepsis Labs: No results for input(s): PROCALCITON, LATICACIDVEN in the last 168 hours.  Recent Results (from the past 240 hour(s))  Resp Panel by RT-PCR (Flu A&B, Covid) Nasopharyngeal Swab     Status: None   Collection Time: 11/14/21  9:10 PM   Specimen: Nasopharyngeal Swab; Nasopharyngeal(NP)  swabs in vial transport medium  Result Value Ref Range Status   SARS Coronavirus 2 by RT PCR NEGATIVE NEGATIVE Final    Comment: (NOTE) SARS-CoV-2 target nucleic acids are NOT DETECTED.  The SARS-CoV-2 RNA is generally detectable in upper respiratory specimens during the acute phase of infection. The lowest concentration of SARS-CoV-2 viral copies this assay can detect is 138 copies/mL. A negative result does not preclude SARS-Cov-2 infection and should not be used as the sole basis for treatment or other patient management decisions. A negative result may occur with  improper specimen collection/handling, submission of specimen other than nasopharyngeal swab, presence of viral mutation(s) within the areas targeted by this assay, and inadequate number of viral copies(<138 copies/mL). A negative result must be combined with clinical observations, patient history, and epidemiological information. The expected result is Negative.  Fact Sheet for Patients:  EntrepreneurPulse.com.au  Fact Sheet for Healthcare Providers:  IncredibleEmployment.be  This test is no t yet approved or cleared by the [place] FDA and  has been authorized for detection and/or diagnosis of SARS-CoV-2 by FDA under an Emergency Use Authorization (EUA). This EUA will remain  in effect (meaning  this test can be used) for the duration of the COVID-19 declaration under Section 564(b)(1) of the Act, 21 U.S.C.section 360bbb-3(b)(1), unless the authorization is terminated  or revoked sooner.       Influenza A by PCR NEGATIVE NEGATIVE Final   Influenza B by PCR NEGATIVE NEGATIVE Final    Comment: (NOTE) The Xpert Xpress SARS-CoV-2/FLU/RSV plus assay is intended as an aid in the diagnosis of influenza from Nasopharyngeal swab specimens and should not be used as a sole basis for treatment. Nasal washings and aspirates are unacceptable for Xpert Xpress  SARS-CoV-2/FLU/RSV testing.  Fact Sheet for Patients: EntrepreneurPulse.com.au  Fact Sheet for Healthcare Providers: IncredibleEmployment.be  This test is not yet approved or cleared by the [place] FDA and has been authorized for detection and/or diagnosis of SARS-CoV-2 by FDA under an Emergency Use Authorization (EUA). This EUA will remain in effect (meaning this test can be used) for the duration of the COVID-19 declaration under Section 564(b)(1) of the Act, 21 U.S.C. section 360bbb-3(b)(1), unless the authorization is terminated or revoked.  Performed at [facility], [place] [address], [place], [place] [address]           Radiology Studies: No results found.      Scheduled Meds:  capecitabine  1,000 mg Oral BID PC   Chlorhexidine Gluconate Cloth  6 each Topical Daily   clarithromycin  500 mg Oral BID   dexamethasone (DECADRON) injection  4 mg Intravenous Q6H   enoxaparin (LOVENOX) injection  40 mg Subcutaneous Q24H   famotidine  20 mg Oral BID   gabapentin  100 mg Oral TID   memantine  10 mg Oral BID   metroNIDAZOLE  500 mg Oral TID   pantoprazole  40 mg Oral Daily   senna-docusate  1 tablet Oral BID   sodium chloride flush  3 mL Intravenous Q12H   tucatinib  300 mg Oral BID   Continuous Infusions:        Aline August, MD [facility] 11/19/2021, 8:23 AM

## 2021-11-19 NOTE — Progress Notes (Signed)
Physical Therapy Treatment ?Patient Details ?Name: Erin [name] ?MRN: [id number] ?DOB: [birth year] ?Today's Date: 11/19/2021 ? ? ?History of Present Illness Patient is a 61 year old female who presented with increased weakness in BLE and falls. 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 PMH: breast cancet metasis, bone metastsis, ovarian CA, chemo 11/05/21, radiation enduced esophagitis, gasteritis. ? ?  ?PT Comments  ? ? Pt motivated and wanting to know all she can about exercises and how to get stronger.  Family realized they are unable to care for her at her current level and she lives in 3rd floor apartment.  SNF would be the safest d/c plan. ASL Interpreter present throughout session.   ?Recommendations for follow up therapy are one component of a multi-disciplinary discharge planning process, led by the attending physician.  Recommendations may be updated based on patient status, additional functional criteria and insurance authorization. ? ?Follow Up Recommendations ? Skilled nursing-short term rehab (<3 hours/day) ?  ?  ?Assistance Recommended at Discharge Frequent or constant Supervision/Assistance  ?Patient can return home with the following A lot of help with walking and/or transfers;A lot of help with bathing/dressing/bathroom;Assistance with cooking/housework;Help with stairs or ramp for entrance ?  ?Equipment Recommendations ? None recommended by PT  ?  ?Recommendations for Other Services   ? ? ?  ?Precautions / Restrictions Precautions ?Precautions: Fall ?Precaution Comments: ASL interpreter needed ?Restrictions ?Weight Bearing Restrictions: No  ?  ? ?Mobility ? Bed Mobility ?  ?  ?  ?  ?  ?  ?  ?General bed mobility comments: up in recliner upon arrival ?  ? ?Transfers ?Overall transfer level: Needs assistance ?Equipment used: Rolling walker (2 wheels) ?Transfers: Sit to/from Stand ?Sit to Stand: Mod assist ?  ?  ?  ?  ?  ?General transfer comment: Cues for hand placement and to  stand a bit before walking.When finishing gait, stood too early and instruction given to finish turning before sitting. ?  ? ?Ambulation/Gait ?Ambulation/Gait assistance: Mod assist, Max assist ?Gait Distance (Feet): 10 Feet ?Assistive device: Rolling walker (2 wheels) ?Gait Pattern/deviations: Decreased step length - right, Decreased step length - left, Wide base of support ?Gait velocity: decreased ?  ?  ?General Gait Details: Wide BOS with weakness noted throughout LE and heavy use of UE ? ? ?Stairs ?  ?  ?  ?  ?  ? ? ?Wheelchair Mobility ?  ? ?Modified Rankin (Stroke Patients Only) ?  ? ? ?  ?Balance   ?  ?  ?  ?  ?Standing balance support: Bilateral upper extremity supported ?Standing balance-Leahy Scale: Poor ?  ?  ?  ?  ?  ?  ?  ?  ?  ?  ?  ?  ?  ? ?  ?Cognition Arousal/Alertness: Awake/alert ?Behavior During Therapy: [facility] for tasks assessed/performed ?Overall Cognitive Status: Within Functional Limits for tasks assessed ?  ?  ?  ?  ?  ?  ?  ?  ?  ?  ?  ?  ?  ?  ?  ?  ?General Comments: ASL Intrepreter present ?  ?  ? ?  ?Exercises General Exercises - Lower Extremity ?Ankle Circles/Pumps: AROM ?Gluteal Sets: Strengthening, 5 reps ?Long Arc Quad: Strengthening, Both, 5 reps ?Hip Flexion/Marching: Strengthening, Both, 5 reps ? ?  ?General Comments General comments (skin integrity, edema, etc.): Given illustrated handout on exercises for the chair and bed. ?  ?  ? ?Pertinent Vitals/Pain Pain Assessment ?Pain Assessment:  No/denies pain  ? ? ?Home Living   ?  ?  ?  ?  ?  ?  ?  ?  ?  ?   ?  ?Prior Function    ?  ?  ?   ? ?PT Goals (current goals can now be found in the care plan section) Acute Rehab PT Goals ?Patient Stated Goal: to go to rehab now ?PT Goal Formulation: With family ?Time For Goal Achievement: 11/30/21 ?Potential to Achieve Goals: Fair ?Progress towards PT goals: Progressing toward goals ? ?  ?Frequency ? ? ? Min 2X/week ? ? ? ?  ?PT Plan Discharge plan needs to be updated;Frequency needs to be  updated  ? ? ?Co-evaluation   ?  ?  ?  ?  ? ?  ?AM-PAC PT "6 Clicks" Mobility   ?Outcome Measure ? Help needed turning from your back to your side while in a flat bed without using bedrails?: A Little ?Help needed moving from lying on your back to sitting on the side of a flat bed without using bedrails?: A Little ?Help needed moving to and from a bed to a chair (including a wheelchair)?: A Lot ?Help needed standing up from a chair using your arms (e.g., wheelchair or bedside chair)?: A Lot ?Help needed to walk in hospital room?: A Lot ?Help needed climbing 3-5 steps with a railing? : Total ?6 Click Score: 13 ? ?  ?End of Session Equipment Utilized During Treatment: Gait belt ?Activity Tolerance: Patient limited by fatigue ?Patient left: in chair;with family/visitor present ?Nurse Communication: Mobility status ?PT Visit Diagnosis: Unsteadiness on feet (R26.81);Muscle weakness (generalized) (M62.81);Other abnormalities of gait and mobility (R26.89) ?  ? ? ?Time: 1000-1040 ?PT Time Calculation (min) (ACUTE ONLY): 40 min ? ?Charges:  $Gait Training: 8-22 mins ?$Therapeutic Exercise: 23-37 mins          ?          ? ?Santiago Glad L. Tamala Julian, PT ? ?11/19/2021 ? ? ? ?Galen Manila ?11/19/2021, 10:53 AM ? ?

## 2021-11-19 NOTE — Progress Notes (Signed)
Occupational Therapy Treatment ?Patient Details ?Name: Erin [name] ?MRN: [id number] ?DOB: Feb 16, 1961 ?Today's Date: 11/19/2021 ? ? ?History of present illness Patient is a 61 year old female who presented with increased weakness in BLE and falls. 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 PMH: breast cancet metasis, bone metastsis, ovarian CA, chemo 11/05/21, radiation enduced esophagitis, gasteritis. ?  ?OT comments ? Upon arrival patient's breakfast tray arrived however wanting to get up to chair. Patient min G for safety with heavy use of bed rails to sit up to edge of bed. Patient needing mod A +1 due to unsteadiness, lower extremity weakness, difficulty with coordination taking steps to chair. Patient sits quickly needing cue to reach back when sitting. Continue to recommend D/C listed below as patient is high fall risk.  ? ?Recommendations for follow up therapy are one component of a multi-disciplinary discharge planning process, led by the attending physician.  Recommendations may be updated based on patient status, additional functional criteria and insurance authorization. ?   ?Follow Up Recommendations ? Skilled nursing-short term rehab (<3 hours/day)  ?  ?Assistance Recommended at Discharge Frequent or constant Supervision/Assistance  ?Patient can return home with the following ? Assistance with cooking/housework;A lot of help with bathing/dressing/bathroom;A lot of help with walking and/or transfers;Direct supervision/assist for financial management;Assist for transportation;Help with stairs or ramp for entrance;Direct supervision/assist for medications management ?  ?Equipment Recommendations ? BSC/3in1  ?  ?   ?Precautions / Restrictions Precautions ?Precautions: Fall ?Precaution Comments: ASL interpreter needed ?Restrictions ?Weight Bearing Restrictions: No  ? ? ?  ? ?Mobility Bed Mobility ?Overal bed mobility: Needs Assistance ?Bed Mobility: Supine to Sit ?  ?  ?Supine to sit: Min  guard, HOB elevated ?  ?  ?General bed mobility comments: Increased time and heavy use of bed rails ?  ? ? ?  ?Balance Overall balance assessment: Needs assistance ?Sitting-balance support: Feet supported ?Sitting balance-Leahy Scale: Fair ?  ?  ?Standing balance support: Bilateral upper extremity supported ?Standing balance-Leahy Scale: Poor ?Standing balance comment: Poor- balance due to weakness in LE ?  ?  ?  ?  ?  ?  ?  ?  ?  ?  ?  ?   ? ?ADL either performed or assessed with clinical judgement  ? ?ADL Overall ADL's : Needs assistance/impaired ?Eating/Feeding: Set up;Sitting ?  ?  ?  ?  ?  ?  ?  ?  ?  ?  ?  ?Toilet Transfer: Moderate assistance;Stand-pivot;Cueing for safety;Rolling walker (2 wheels) ?Toilet Transfer Details (indicate cue type and reason): Patient needing mod A +1 due to difficulty advancing LEs towards recliner chair. Patient is unsteady, fall risk ?  ?  ?  ?  ?  ?  ?  ? ? ? ?Cognition Arousal/Alertness: Awake/alert ?Behavior During Therapy: [facility] for tasks assessed/performed ?Overall Cognitive Status: Within Functional Limits for tasks assessed ?  ?  ?  ?  ?  ?  ?  ?  ?  ?  ?  ?  ?  ?  ?  ?  ?General Comments: ASL Intrepreter present ?  ?  ?   ?   ?   ?General Comments Given illustrated handout on exercises for the chair and bed.  ? ? ?Pertinent Vitals/ Pain       Pain Assessment ?Pain Assessment: No/denies pain ? ?   ?   ? ?Frequency ? Min 2X/week  ? ? ? ? ?  ?Progress Toward Goals ? ?OT Goals(current goals  can now be found in the care plan section) ? Progress towards OT goals: Progressing toward goals ? ?Acute Rehab OT Goals ?Patient Stated Goal: sit in chair ?OT Goal Formulation: With patient ?Time For Goal Achievement: 11/29/21  ?Plan Discharge plan remains appropriate   ? ?   ?AM-PAC OT "6 Clicks" Daily Activity     ?Outcome Measure ? ? Help from another person eating meals?: A Little ?Help from another person taking care of personal grooming?: A Little ?Help from another person toileting,  which includes using toliet, bedpan, or urinal?: A Lot ?Help from another person bathing (including washing, rinsing, drying)?: A Lot ?Help from another person to put on and taking off regular upper body clothing?: A Little ?Help from another person to put on and taking off regular lower body clothing?: A Lot ?6 Click Score: 15 ? ?  ?End of Session Equipment Utilized During Treatment: Rolling walker (2 wheels) ? ?OT Visit Diagnosis: Unsteadiness on feet (R26.81);Other abnormalities of gait and mobility (R26.89);Muscle weakness (generalized) (M62.81);Repeated falls (R29.6) ?  ?Activity Tolerance Patient tolerated treatment well ?  ?Patient Left in chair;with call bell/phone within reach ?  ?Nurse Communication Mobility status ?  ? ?   ? ?Time: 3154-0086 ?OT Time Calculation (min): 13 min ? ?Charges: OT General Charges ?$OT Visit: 1 Visit ?OT Treatments ?$Self Care/Home Management : 8-22 mins ? ?Delbert Phenix OT ?OT pager: [phone] ? ? ?Rosemary Holms ?11/19/2021, 12:44 PM ?

## 2021-11-20 DIAGNOSIS — C50512 Malignant neoplasm of lower-outer quadrant of left female breast: Secondary | ICD-10-CM | POA: Diagnosis not present

## 2021-11-20 DIAGNOSIS — R29898 Other symptoms and signs involving the musculoskeletal system: Secondary | ICD-10-CM | POA: Diagnosis not present

## 2021-11-20 DIAGNOSIS — C7949 Secondary malignant neoplasm of other parts of nervous system: Secondary | ICD-10-CM | POA: Diagnosis not present

## 2021-11-20 DIAGNOSIS — K2970 Gastritis, unspecified, without bleeding: Secondary | ICD-10-CM | POA: Diagnosis not present

## 2021-11-20 NOTE — TOC Initial Note (Signed)
Transition of Care (TOC) - Initial/Assessment Note  ? ? ?Patient Details  ?Name: Erin [name] ?MRN: [id number] ?Date of Birth: 19-Mar-1961 ? ?Transition of Care (TOC) CM/SW Contact:    ?Junie Avilla, Marjie Skiff, RN ?Phone Number: ?11/20/2021, 8:43 AM ? ?Clinical Narrative:                 ?Spoke with pt at length for dc planning. Also in attendance in family meeting were pt mother, pt sister, and English as a second language teacher. We spoke about her current home situation and that physical therapy recommendations at this time are for SNF. We went over the multiple barriers for SNF placement. If pt wants to continue oral chemo, SNF facilities will not accept her due to cost of medication. Even if family brings in the medication SNF facilities will not take on the liability if the family does not follow through with bringing medications. ? ?Second barrier is that pt only has Medicaid. Medicaid will not pay for short term rehab. She would not get PT/OT at SNF with just Medicaid, only room and board. We discussed how family could assist with pt going home and TOC could complete paperwork for pt to apply for expedited PCS services. This would give additional daily assistance at home. Pt already active with [facility] for home health services and can continue at dc with PT/OT/RN/CSW. ? ?At the end of our meeting Kelle  joined [place] via [place] from [facility]. She is helping assist pt with changing her housing situation as her apartment is on the 3rd floor. We collaborated to attempt to come up with additional assistance for the situation. At the conclusion of the meeting pt decided to speak with her son about moving in with him in his first floor apartment. TOC will follow through with PCS paperwork. TOC will continue to follow. ? ? ? ? ?Expected Discharge Plan: [place] ?Barriers to Discharge: Continued Medical Work up ? ? ?Patient Goals and CMS Choice ?  ?  ?  ? ?Expected Discharge Plan  and Services ?Expected Discharge Plan: [place] ?  ?Discharge Planning Services: CM Consult ?  ?Living arrangements for the past 2 months: Apartment ?                ?  ?  ?  ?  ?  ?HH Arranged: RN, PT, OT, Social Work ?[place] Agency: [facility] ?Date HH Agency Contacted: 11/18/21 ?  ?  ? ?Prior Living Arrangements/Services ?Living arrangements for the past 2 months: Apartment ?Lives with:: Self ?Patient language and need for interpreter reviewed:: Yes ?       ?Need for Family Participation in Patient Care: Yes (Comment) ?Care giver support system in place?: Yes (comment) ?Current home services: Home PT ?Criminal Activity/Legal Involvement Pertinent to Current Situation/Hospitalization: No - Comment as needed ? ?Activities of Daily Living ?Home Assistive Devices/Equipment: Gilford Rile (specify type) [employer] wheel) ?ADL Screening (condition at time of admission) ?Patient's cognitive ability adequate to safely complete daily activities?: Yes ?Is the patient deaf or have difficulty hearing?: Yes ?Does the patient have difficulty seeing, even when wearing glasses/contacts?: Yes ?Does the patient have difficulty concentrating, remembering, or making decisions?: No ?Patient able to express need for assistance with ADLs?: Yes ?Does the patient have difficulty dressing or bathing?: Yes ?Independently performs ADLs?: Yes (appropriate for developmental age) ?Does the patient have difficulty walking or climbing stairs?: Yes ?Weakness of Legs: Both ?Weakness of  Arms/Hands: None ? ?Permission Sought/Granted ?  ?Permission granted to share information with : Yes, Verbal Permission Granted ?   ? Permission granted to share info w AGENCY: [facility] ?   ?   ? ?Emotional Assessment ?Appearance:: Appears stated age ?Attitude/Demeanor/Rapport: Gracious ?Affect (typically observed): Calm ?Orientation: : Oriented to Self, Oriented to Place, Oriented to  Time, Oriented to Situation ?Alcohol / Substance Use:  Not Applicable ?Psych Involvement: No (comment) ? 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 Diagnosis Date Noted  ? Lower extremity weakness 11/14/2021  ? Metastatic breast carcinoma ([place]) 08/27/2021  ? 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 Burning in the chest   ? Vomiting   ? Dysphagia 01/29/2020  ? 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  ? Esophageal candidiasis ([place]) 01/29/2020  ? Bone metastases ([place]) 12/28/2019  ? Pain from bone metastases ([place]) 12/28/2019  ? Goals of care, counseling/discussion 11/07/2019  ? 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 Elevated CA-125 10/09/2019  ? Paresthesia 04/17/2019  ? BRCA2 gene mutation positive 02/17/2018  ? Genetic testing 02/17/2018  ? Family history of breast cancer   ? Family history of colon cancer   ? 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 S/P vaginal hysterectomy 11/04/2017  ? Ovarian mass, left 08/26/2017  ? Menorrhagia 08/26/2017  ? Port-A-Cath in place 07/23/2017  ? Malignant neoplasm of lower-outer quadrant of left breast of female, estrogen receptor negative ([place]) 05/13/2017  ? Abdominal bloating 04/02/2017  ? Chronic right-sided low back pain without sciatica 07/02/2016  ? DM type 2 (diabetes mellitus, type 2) ([place]) 11/18/2014  ? Gastritis 06/06/2010  ? Intermittent constipation 06/06/2010  ? UNSPECIFIED IRON DEFICIENCY ANEMIA 04/22/2010  ? DEPRESSION 04/10/2010  ? Hearing loss 04/10/2010  ? ?PCP:  [name], NP ?Pharmacy:   ?[facility] ?[address] ?[place] [address] ?Phone: [phone] Fax: [phone] ? ? ? ? ?Social Determinants of Health (SDOH) Interventions ?  ? ?Readmission Risk  Interventions ?Readmission Risk Prevention Plan 01/31/2020  ?Transportation Screening Complete  ?PCP or Specialist Appt within 3-5 Days Complete  ?[place] or Home Care Consult Complete  ?Social Work Consult for [place] Planning/Counseling Complete  ?Palliative Care Screening Not Applicable  ?Medication Review Press photographer) Complete  ?Some recent data might be hidden  ? ? ? ?

## 2021-11-20 NOTE — Progress Notes (Signed)
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  Neurology and oncology were consulted.  Assessment & Plan:   Progressive lower extremity weakness Metastatic left breast cancer with extensive spinal metastasis/leptomeningeal carcinomatosis/hepatic metastases -Neurology recommended oncology input and possible neurosurgery evaluation after oncology input.  I had communicated with Dr. Cabbell/neurosurgery on phone on 11/17/2021: He reviewed patient's chart and imaging and apparently patient is not a neurosurgical candidate. -Oncology following and patient has been on Decadron.  Oncology has resumed patient's oral chemotherapy regimen: Patient will be taking her home supply of these medications.   -PT/OT now recommending SNF placement.  Social worker following.  Apparently, patient will not be accepted at SNF because of her current chemotherapy even though she can bring her own chemotherapy pills.  Patient will possibly need to go home with home health. -Mother asked to see if next Wednesday is chemotherapy can be given inpatient.  I will let Dr. [name] know -Palliative care consultation pending. -Continue pain management and bowel regimen  Possible H. pylori gastritis -Was recently started on triple therapy as an outpatient.  Continue.  Outpatient follow-up  Anemia of chronic disease Macrocytosis -Hemoglobin stable.  Monitor intermittently.  Mildly elevated LFTs -ALT/AST mildly elevated: Improving.  Monitor intermittently.  Hyponatremia -Improved.  DVT prophylaxis: Lovenox Code Status: Full Family Communication: Mother at bedside.   Communicated with the patient via sign interpreter/Kathy Disposition Plan: Status is: Inpatient Remains inpatient appropriate because: Of need for SNF placement.  Currently medically stable for discharge to SNF.  Consultants: Neurology/oncology.  Palliative care.  Discussed with neurosurgery as well.  Procedures: None  Antimicrobials:  Anti-infectives (From admission, onward)    Start     Dose/Rate Route Frequency Ordered Stop   11/14/21 2330  clarithromycin (BIAXIN) tablet 500 mg       Note to Pharmacy: For 14 days     500 mg Oral 2 times daily 11/14/21 2308     11/14/21 2315  metroNIDAZOLE (FLAGYL) tablet 500 mg        500 mg Oral 3 times daily 11/14/21 2308          Subjective: Patient seen and examined at bedside.  Communicated via sign interpreter at bedside.  Denies worsening shortness of breath, vomiting or fever.  Still has weakness in the lower extremities. Objective: Vitals:   11/19/21 1411 11/19/21 1530 11/19/21 2155 11/20/21 0520  BP: 131/82 (!) 143/97 139/85 137/90  Pulse: 100 (!) 105 86 81  Resp: '16 18 18 17  '$ Temp: 98.1 F (36.7 C) 98.5 F (36.9 C) 98.1 F (36.7 C) 97.9 F (36.6 C)  TempSrc: Oral Oral Oral Oral  SpO2: 98% 98% 95% 98%  Weight:      Height:        Intake/Output Summary (Last 24 hours) at 11/20/2021 0759 Last data filed at 11/20/2021 0400 Gross per 24 hour  Intake 720 ml  Output 1950 ml  Net -1230 ml    Filed Weights   11/14/21 1622  Weight: 54 kg    Examination:  General exam: On room air.  No acute distress. Respiratory system: Bilateral decreased breath sounds at bases with scattered crackles cardiovascular system: Currently rate controlled; S1-S2 heard  gastrointestinal system: Abdomen is slightly  distended; soft and nontender.  Normal bowel sounds heard  extremities: Trace lower extremity edema; no clubbing   Data Reviewed: I have personally reviewed following labs and imaging studies  CBC: Recent Labs  Lab 11/14/21 1637  11/15/21 0530 11/16/21 0524 11/17/21 0459  WBC 9.7 7.7 5.5 6.7  NEUTROABS 7.7  --   --   --   HGB 12.1 10.6* 10.0* 11.2*  HCT 37.3 32.5* 31.3* 35.9*  MCV 102.2* 102.2* 102.3* 104.4*  PLT 206 195 173 195    Basic Metabolic Panel: Recent Labs  Lab 11/14/21 1637 11/15/21 0530 11/16/21 0524 11/17/21 0459  NA 136 135 134* 135  K 3.1* 4.6 3.6 4.7  CL 102 105 104 103  CO2 '24 24 23 24  '$ GLUCOSE 135* 112* 103* 202*  BUN '19 15 9 9  '$ CREATININE 0.69 0.68 0.58 0.59  CALCIUM 9.2 8.2* 8.4* 9.2  MG  --  2.3  --   --     GFR: Estimated Creatinine Clearance: 57.8 mL/min (by C-G formula based on SCr of 0.59 mg/dL). Liver Function Tests: Recent Labs  Lab 11/14/21 1637 11/15/21 0530 11/16/21 0524 11/17/21 0459  AST 45* 43* 29 28  ALT 78* 56* 50* 50*  ALKPHOS 125 98 99 121  BILITOT 0.4 1.4* 0.5 0.2*  PROT 8.5* 7.1 7.0 7.8  ALBUMIN 4.0 3.4* 3.4* 3.6    No results for input(s): LIPASE, AMYLASE in the last 168 hours. No results for input(s): AMMONIA in the last 168 hours. Coagulation Profile: No results for input(s): INR, PROTIME in the last 168 hours. Cardiac Enzymes: No results for input(s): CKTOTAL, CKMB, CKMBINDEX, TROPONINI in the last 168 hours. BNP (last 3 results) No results for input(s): PROBNP in the last 8760 hours. HbA1C: No results for input(s): HGBA1C in the last 72 hours. CBG: No results for input(s): GLUCAP in the last 168 hours. Lipid Profile: No results for input(s): CHOL, HDL, LDLCALC, TRIG, CHOLHDL, LDLDIRECT in the last 72 hours. Thyroid Function Tests: No results for input(s): TSH, T4TOTAL, FREET4, T3FREE, THYROIDAB in the last 72 hours. Anemia Panel: No results for input(s): VITAMINB12, FOLATE, FERRITIN, TIBC, IRON, RETICCTPCT in the last 72 hours.  Sepsis Labs: No results for input(s): PROCALCITON, LATICACIDVEN in the last 168 hours.  Recent Results (from the past 240 hour(s))  Resp Panel by RT-PCR (Flu A&B, Covid) Nasopharyngeal Swab     Status:  None   Collection Time: 11/14/21  9:10 PM   Specimen: Nasopharyngeal Swab; Nasopharyngeal(NP) swabs in vial transport medium  Result Value Ref Range Status   SARS Coronavirus 2 by RT PCR NEGATIVE NEGATIVE Final    Comment: (NOTE) SARS-CoV-2 target nucleic acids are NOT DETECTED.  The SARS-CoV-2 RNA is generally detectable in upper respiratory specimens during the acute phase of infection. The lowest concentration of SARS-CoV-2 viral copies this assay can detect is 138 copies/mL. A negative result does not preclude SARS-Cov-2 infection and should not be used as the sole basis for treatment or other patient management decisions. A negative result may occur with  improper specimen collection/handling, submission of specimen other than nasopharyngeal swab, presence of viral mutation(s) within the areas targeted by this assay, and inadequate number of viral copies(<138 copies/mL). A negative result must be combined with clinical observations, patient history, and epidemiological information. The expected result is Negative.  Fact Sheet for Patients:  EntrepreneurPulse.com.au  Fact Sheet for Healthcare Providers:  IncredibleEmployment.be  This test is no t yet approved or cleared by the [place] and  has been authorized for detection and/or diagnosis of SARS-CoV-2 by FDA under an Emergency Use Authorization (EUA). This EUA will remain  in effect (meaning this test can be used) for the duration of the COVID-19 declaration under Section 564(b)(1) of the Act, 21 U.S.C.section 360bbb-3(b)(1), unless the authorization is terminated  or revoked sooner.       Influenza A by PCR NEGATIVE NEGATIVE Final   Influenza B by PCR NEGATIVE NEGATIVE Final    Comment: (NOTE) The Xpert Xpress SARS-CoV-2/FLU/RSV plus assay is intended as an aid in the diagnosis of influenza from Nasopharyngeal swab specimens and should not be used as a sole basis for  treatment. Nasal washings and aspirates are unacceptable for Xpert Xpress SARS-CoV-2/FLU/RSV testing.  Fact Sheet for Patients: EntrepreneurPulse.com.au  Fact Sheet for Healthcare Providers: IncredibleEmployment.be  This test is not yet approved or cleared by the [place] FDA and has been authorized for detection and/or diagnosis of SARS-CoV-2 by FDA under an Emergency Use Authorization (EUA). This EUA will remain in effect (meaning this test can be used) for the duration of the COVID-19 declaration under Section 564(b)(1) of the Act, 21 U.S.C. section 360bbb-3(b)(1), unless the authorization is terminated or revoked.  Performed at [facility] [place] [address] [place], [place] [address]           Radiology Studies: No results found.      Scheduled Meds:  capecitabine  1,000 mg Oral BID PC   Chlorhexidine Gluconate Cloth  6 each Topical Daily   clarithromycin  500 mg Oral BID   dexamethasone (DECADRON) injection  4 mg Intravenous Q6H   enoxaparin (LOVENOX) injection  40 mg Subcutaneous Q24H   famotidine  20 mg Oral BID   gabapentin  100 mg Oral TID   memantine  10 mg Oral BID   metroNIDAZOLE  500 mg Oral TID   pantoprazole  40 mg Oral Daily   senna-docusate  1 tablet Oral BID   sodium chloride flush  3 mL Intravenous Q12H   tucatinib  300 mg Oral BID   Continuous Infusions:        Aline August, MD [facility] 11/20/2021, 7:59 AM

## 2021-11-20 NOTE — TOC Progression Note (Signed)
Transition of Care (TOC) - Progression Note  ? ? ?Patient Details  ?Name: LIS SAVITT ?MRN: [id number] ?Date of Birth: [birth year] ? ?Transition of Care (TOC) CM/SW Contact  ?Jamisyn Langer, Marjie Skiff, RN ?Phone Number: ?11/20/2021, 10:07 AM ? ?Clinical Narrative:    ?Expedited PCS services application signed by MD and faxed to [employer] (fax [phone]) ? ? ?Expected Discharge Plan: [place] ?Barriers to Discharge: Continued Medical Work up ? ?Expected Discharge Plan and Services ?Expected Discharge Plan: [place] ?  ?Discharge Planning Services: CM Consult ?  ?Living arrangements for the past 2 months: Apartment ?                ?   ?HH Arranged: RN, PT, OT, Social Work ?[place] Agency: [facility] ([place]) ?Date HH Agency Contacted: 11/18/21 ?  ?  ? ?Readmission Risk Interventions ?Readmission Risk Prevention Plan 01/31/2020  ?Transportation Screening Complete  ?PCP or Specialist Appt within 3-5 Days Complete  ?[place] or Home Care Consult Complete  ?Social Work Consult for [place] Planning/Counseling Complete  ?Palliative Care Screening Not Applicable  ?Medication Review Press photographer) Complete  ?Some recent data might be hidden  ? ? ?

## 2021-11-21 DIAGNOSIS — C7951 Secondary malignant neoplasm of bone: Secondary | ICD-10-CM | POA: Diagnosis not present

## 2021-11-21 DIAGNOSIS — K2970 Gastritis, unspecified, without bleeding: Secondary | ICD-10-CM | POA: Diagnosis not present

## 2021-11-21 DIAGNOSIS — C7949 Secondary malignant neoplasm of other parts of nervous system: Secondary | ICD-10-CM | POA: Diagnosis not present

## 2021-11-21 DIAGNOSIS — C50512 Malignant neoplasm of lower-outer quadrant of left female breast: Secondary | ICD-10-CM | POA: Diagnosis not present

## 2021-11-21 NOTE — Progress Notes (Signed)
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  Neurology and oncology were consulted.  Assessment & Plan:   Progressive lower extremity weakness Metastatic left breast cancer with extensive spinal metastasis/leptomeningeal carcinomatosis/hepatic metastases -Neurology recommended oncology input and possible neurosurgery evaluation after oncology input.  I had communicated with Dr. Cabbell/neurosurgery on phone on 11/17/2021: He reviewed patient's chart and imaging and apparently patient is not a neurosurgical candidate. -Oncology following and patient has been on Decadron.  Oncology has resumed patient's oral chemotherapy regimen: Patient will be taking her home supply of these medications.   -PT/OT now recommending SNF placement.  Social worker following.  Apparently, patient will not be accepted at SNF because of her current chemotherapy even though she can bring her own chemotherapy pills.  Patient will possibly need to go home with home health. -Chemotherapy scheduled for next week at cancer center will remain on hold till patient is physically able to go to cancer center.  Chemotherapy will not be provided inpatient as per oncology. -Patient will benefit from palliative care consultation as an outpatient. -Continue pain management and bowel regimen  Possible H. pylori gastritis -Was recently started on triple therapy as an outpatient.  Continue.  Outpatient follow-up  Anemia of chronic disease Macrocytosis -Hemoglobin stable.  Monitor intermittently.  Mildly elevated LFTs -ALT/AST mildly elevated: Improving.  Monitor  intermittently.  Hyponatremia -Improved.  DVT prophylaxis: Lovenox Code Status: Full Family Communication: Mother at bedside.  Communicated with the patient via sign interpreter/Mark Disposition Plan: Status is: Inpatient Remains inpatient appropriate because: Persistent weakness.  Will need home health.  Possible discharge on Monday Consultants: Neurology/oncology.  Palliative care.  Discussed with neurosurgery as well.  Procedures: None  Antimicrobials:  Anti-infectives (From admission, onward)    Start     Dose/Rate Route Frequency Ordered Stop   11/14/21 2330  clarithromycin (BIAXIN) tablet 500 mg       Note to Pharmacy: For 14 days     500 mg Oral 2 times daily 11/14/21 2308 11/28/21 2159   11/14/21 2315  metroNIDAZOLE (FLAGYL) tablet 500 mg        500 mg Oral 3 times daily 11/14/21 2308 11/28/21 2159        Subjective: Patient seen and examined at bedside.  Communicated via sign interpreter at bedside.  No fever, vomiting, worsening shortness of breath reported.   Objective: Vitals:   11/19/21 2155 11/20/21 0520 11/20/21 2115 11/21/21 0557  BP: 139/85 137/90 131/81 129/86  Pulse: 86 81 85 81  Resp: '18 17 18 17  '$ Temp: 98.1 F (36.7 C) 97.9 F (36.6 C) 97.9 F (36.6 C) 97.7 F (36.5 C)  TempSrc: Oral Oral Oral Oral  SpO2: 95% 98% 99% 99%  Weight:      Height:        Intake/Output Summary (Last 24 hours) at 11/21/2021 0722 Last data filed at 11/21/2021 0600 Gross per 24 hour  Intake 803 ml  Output 1650 ml  Net -847 ml    Filed Weights   11/14/21 1622  Weight: 54 kg    Examination:  General exam: No distress.  Currently on room air.   Respiratory system: Decreased breath sounds at bases with some crackles, no wheezing  cardiovascular  system: S1-S2 heard; rate controlled  gastrointestinal system: Abdomen is distended mildly; soft and nontender.  Bowel sounds are heard  extremities: No cyanosis; mild lower extremity edema present  Data Reviewed: I  have personally reviewed following labs and imaging studies  CBC: Recent Labs  Lab 11/14/21 1637 11/15/21 0530 11/16/21 0524 11/17/21 0459  WBC 9.7 7.7 5.5 6.7  NEUTROABS 7.7  --   --   --   HGB 12.1 10.6* 10.0* 11.2*  HCT 37.3 32.5* 31.3* 35.9*  MCV 102.2* 102.2* 102.3* 104.4*  PLT 206 195 173 119    Basic Metabolic Panel: Recent Labs  Lab 11/14/21 1637 11/15/21 0530 11/16/21 0524 11/17/21 0459  NA 136 135 134* 135  K 3.1* 4.6 3.6 4.7  CL 102 105 104 103  CO2 '24 24 23 24  '$ GLUCOSE 135* 112* 103* 202*  BUN '19 15 9 9  '$ CREATININE 0.69 0.68 0.58 0.59  CALCIUM 9.2 8.2* 8.4* 9.2  MG  --  2.3  --   --     GFR: Estimated Creatinine Clearance: 57.8 mL/min (by C-G formula based on SCr of 0.59 mg/dL). Liver Function Tests: Recent Labs  Lab 11/14/21 1637 11/15/21 0530 11/16/21 0524 11/17/21 0459  AST 45* 43* 29 28  ALT 78* 56* 50* 50*  ALKPHOS 125 98 99 121  BILITOT 0.4 1.4* 0.5 0.2*  PROT 8.5* 7.1 7.0 7.8  ALBUMIN 4.0 3.4* 3.4* 3.6    No results for input(s): LIPASE, AMYLASE in the last 168 hours. No results for input(s): AMMONIA in the last 168 hours. Coagulation Profile: No results for input(s): INR, PROTIME in the last 168 hours. Cardiac Enzymes: No results for input(s): CKTOTAL, CKMB, CKMBINDEX, TROPONINI in the last 168 hours. BNP (last 3 results) No results for input(s): PROBNP in the last 8760 hours. HbA1C: No results for input(s): HGBA1C in the last 72 hours. CBG: No results for input(s): GLUCAP in the last 168 hours. Lipid Profile: No results for input(s): CHOL, HDL, LDLCALC, TRIG, CHOLHDL, LDLDIRECT in the last 72 hours. Thyroid Function Tests: No results for input(s): TSH, T4TOTAL, FREET4, T3FREE, THYROIDAB in the last 72 hours. Anemia Panel: No results for input(s): VITAMINB12, FOLATE, FERRITIN, TIBC, IRON, RETICCTPCT in the last 72 hours.  Sepsis Labs: No results for input(s): PROCALCITON, LATICACIDVEN in the last 168 hours.  Recent Results  (from the past 240 hour(s))  Resp Panel by RT-PCR (Flu A&B, Covid) Nasopharyngeal Swab     Status: None   Collection Time: 11/14/21  9:10 PM   Specimen: Nasopharyngeal Swab; Nasopharyngeal(NP) swabs in vial transport medium  Result Value Ref Range Status   SARS Coronavirus 2 by RT PCR NEGATIVE NEGATIVE Final    Comment: (NOTE) SARS-CoV-2 target nucleic acids are NOT DETECTED.  The SARS-CoV-2 RNA is generally detectable in upper respiratory specimens during the acute phase of infection. The lowest concentration of SARS-CoV-2 viral copies this assay can detect is 138 copies/mL. A negative result does not preclude SARS-Cov-2 infection and should not be used as the sole basis for treatment or other patient management decisions. A negative result may occur with  improper specimen collection/handling, submission of specimen other than nasopharyngeal swab, presence of viral mutation(s) within the areas targeted by this assay, and inadequate number of viral copies(<138 copies/mL). A negative result must be combined with clinical observations, patient history, and epidemiological information. The expected result is Negative.  Fact Sheet for Patients:  EntrepreneurPulse.com.au  Fact Sheet for Healthcare Providers:  IncredibleEmployment.be  This test is no  t yet approved or cleared by the [place] and  has been authorized for detection and/or diagnosis of SARS-CoV-2 by FDA under an Emergency Use Authorization (EUA). This EUA will remain  in effect (meaning this test can be used) for the duration of the COVID-19 declaration under Section 564(b)(1) of the Act, 21 U.S.C.section 360bbb-3(b)(1), unless the authorization is terminated  or revoked sooner.       Influenza A by PCR NEGATIVE NEGATIVE Final   Influenza B by PCR NEGATIVE NEGATIVE Final    Comment: (NOTE) The Xpert Xpress SARS-CoV-2/FLU/RSV plus assay is intended as an aid in the  diagnosis of influenza from Nasopharyngeal swab specimens and should not be used as a sole basis for treatment. Nasal washings and aspirates are unacceptable for Xpert Xpress SARS-CoV-2/FLU/RSV testing.  Fact Sheet for Patients: EntrepreneurPulse.com.au  Fact Sheet for Healthcare Providers: IncredibleEmployment.be  This test is not yet approved or cleared by the [place] FDA and has been authorized for detection and/or diagnosis of SARS-CoV-2 by FDA under an Emergency Use Authorization (EUA). This EUA will remain in effect (meaning this test can be used) for the duration of the COVID-19 declaration under Section 564(b)(1) of the Act, 21 U.S.C. section 360bbb-3(b)(1), unless the authorization is terminated or revoked.  Performed at [facility], [place] [address], [place], [place] [address]           Radiology Studies: No results found.      Scheduled Meds:  capecitabine  1,000 mg Oral BID PC   Chlorhexidine Gluconate Cloth  6 each Topical Daily   clarithromycin  500 mg Oral BID   dexamethasone (DECADRON) injection  4 mg Intravenous Q6H   enoxaparin (LOVENOX) injection  40 mg Subcutaneous Q24H   famotidine  20 mg Oral BID   gabapentin  100 mg Oral TID   memantine  10 mg Oral BID   metroNIDAZOLE  500 mg Oral TID   pantoprazole  40 mg Oral Daily   senna-docusate  1 tablet Oral BID   sodium chloride flush  3 mL Intravenous Q12H   tucatinib  300 mg Oral BID   Continuous Infusions:        Aline August, MD [facility] 11/21/2021, 7:22 AM

## 2021-11-21 NOTE — Progress Notes (Signed)
Physical Therapy Treatment ?Patient Details ?Name: Erin [name] ?MRN: [id number] ?DOB: [birth year] ?Today's Date: 11/21/2021 ? ? ?History of Present Illness Patient is a 61 year old female who presented with increased weakness in BLE and falls. 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 PMH: breast cancet metastasis, bone metastasis, ovarian CA, chemo 11/05/21, radiation enduced esophagitis, gasteritis. ? ?  ?PT Comments  ? ? Max A to stand on today. Pt only able to stand for ~15 seconds before legs give way. High fall risk! Pt reports she had a near fall earlier today when trying to stand pivot over to bsc using RW with nursing. Nursing may need to consider using STEDY and +2 assist for safety (cautioned that pt also has a weak trunk). Unsure if pt will be able to d/c to SNF per chart review. If not, will need HHPT f/u and very likely 24/7 supervision/assist.  ?  ?Recommendations for follow up therapy are one component of a multi-disciplinary discharge planning process, led by the attending physician.  Recommendations may be updated based on patient status, additional functional criteria and insurance authorization. ? ?Follow Up Recommendations ? Skilled nursing-short term rehab (<3 hours/day) ?  ?  ?Assistance Recommended at Discharge Frequent or constant Supervision/Assistance  ?Patient can return home with the following Two people to help with bathing/dressing/bathroom;A lot of help with bathing/dressing/bathroom;Help with stairs or ramp for entrance;Assist for transportation;Assistance with cooking/housework ?  ?Equipment Recommendations ? Wheelchair;Wheelchair cushion; Hospital bed (PTAR transport home?)  ?  ?Recommendations for Other Services   ? ? ?  ?Precautions / Restrictions Precautions ?Precautions: Fall ?Precaution Comments: ASL interpreter needed; high fall risk ?Restrictions ?Weight Bearing Restrictions: No  ?  ? ?Mobility ? Bed Mobility ?Overal bed mobility: Needs Assistance ?Bed  Mobility: Supine to Sit, Sit to Supine ?  ?  ?Supine to sit: HOB elevated, Min assist ?Sit to supine: Mod assist, HOB elevated ?  ?General bed mobility comments: Assist to stabilize trunk. Assist for bil LEs back onto bed. Increased time and effort for pt. Heavy reliance on UEs and bedrail ?  ? ?Transfers ?Overall transfer level: Needs assistance ?Equipment used: Rolling walker (2 wheels) ?Transfers: Sit to/from Stand ?Sit to Stand: Max assist, From elevated surface ?  ?  ?  ?  ?  ?General transfer comment: x3 for safety, technique, tolerance, strengthening. Significant assist to power up and stabilize in standing. Pt stood for ~15 seconds before legs give way. High fall risk. ?  ? ?Ambulation/Gait ?  ?  ?  ?  ?  ?  ?  ?General Gait Details: NT-unable to safely attempt with +1 assist. ? ? ?Stairs ?  ?  ?  ?  ?  ? ? ?Wheelchair Mobility ?  ? ?Modified Rankin (Stroke Patients Only) ?  ? ? ?  ?Balance Overall balance assessment: Needs assistance ?  ?  ?  ?  ?Standing balance support: Bilateral upper extremity supported, Reliant on assistive device for balance ?Standing balance-Leahy Scale: Poor ?  ?  ?  ?  ?  ?  ?  ?  ?  ?  ?  ?  ?  ? ?  ?Cognition Arousal/Alertness: Awake/alert ?Behavior During Therapy: [facility] for tasks assessed/performed ?Overall Cognitive Status: Within Functional Limits for tasks assessed ?  ?  ?  ?  ?  ?  ?  ?  ?  ?  ?  ?  ?  ?  ?  ?  ?General Comments: ASL Intrepreter present ?  ?  ? ?  ?  Exercises   ? ?  ?General Comments   ?  ?  ? ?Pertinent Vitals/Pain Pain Assessment ?Pain Assessment: No/denies pain ?Pain Score: 0-No pain  ? ? ?Home Living   ?  ?  ?  ?  ?  ?  ?  ?  ?  ?   ?  ?Prior Function    ?  ?  ?   ? ?PT Goals (current goals can now be found in the care plan section) Progress towards PT goals: Progressing toward goals ? ?  ?Frequency ? ? ? Min 2X/week ? ? ? ?  ?PT Plan Current plan remains appropriate  ? ? ?Co-evaluation   ?  ?  ?  ?  ? ?  ?AM-PAC PT "6 Clicks" Mobility   ?Outcome Measure ?  Help needed turning from your back to your side while in a flat bed without using bedrails?: A Little ?Help needed moving from lying on your back to sitting on the side of a flat bed without using bedrails?: A Little ?Help needed moving to and from a bed to a chair (including a wheelchair)?: Total ?Help needed standing up from a chair using your arms (e.g., wheelchair or bedside chair)?: A Lot ?Help needed to walk in hospital room?: Total ?Help needed climbing 3-5 steps with a railing? : Total ?6 Click Score: 11 ? ?  ?End of Session Equipment Utilized During Treatment: Gait belt ?Activity Tolerance: Patient limited by fatigue ?Patient left: in bed;with call bell/phone within reach;with bed alarm set ?  ?PT Visit Diagnosis: Unsteadiness on feet (R26.81);Muscle weakness (generalized) (M62.81);Other abnormalities of gait and mobility (R26.89) ?  ? ? ?Time: 2778-2423 ?PT Time Calculation (min) (ACUTE ONLY): 22 min ? ?Charges:  $Therapeutic Activity: 8-22 mins          ?          ? ? ? ? ?Doreatha Massed, PT ?Acute Rehabilitation  ?Office: [phone] ?Pager: [phone] ? ?  ? ?

## 2021-11-21 NOTE — TOC Progression Note (Signed)
Transition of Care (TOC) - Progression Note  ? ? ?Patient Details  ?Name: Erin [name] ?MRN: [id number] ?Date of Birth: 1961/05/01 ? ?Transition of Care (TOC) CM/SW Contact  ?Tytianna Greenley, Marjie Skiff, RN ?Phone Number: ?11/21/2021, 10:45 AM ? ?Clinical Narrative:    ?[employer] called to confirm that expedited PCS form received. It was received and being processed. ? ? ?Expected Discharge Plan: [place] ?Barriers to Discharge: Continued Medical Work up ? ?Expected Discharge Plan and Services ?Expected Discharge Plan: [place] ?  ?Discharge Planning Services: CM Consult ?  ?Living arrangements for the past 2 months: Apartment ?                ?  ?  ?  ?  ?  ?HH Arranged: RN, PT, OT, Social Work ?[place] Agency: [place] ([place]) ?Date HH Agency Contacted: 11/18/21 ?  ?  ? ? ?Social Determinants of Health (SDOH) Interventions ?  ? ?Readmission Risk Interventions ?Readmission Risk Prevention Plan 01/31/2020  ?Transportation Screening Complete  ?PCP or Specialist Appt within 3-5 Days Complete  ?[place] or Home Care Consult Complete  ?Social Work Consult for [place] Planning/Counseling Complete  ?Palliative Care Screening Not Applicable  ?Medication Review Press photographer) Complete  ?Some recent data might be hidden  ? ? ?

## 2021-11-22 DIAGNOSIS — C7949 Secondary malignant neoplasm of other parts of nervous system: Secondary | ICD-10-CM | POA: Diagnosis not present

## 2021-11-22 DIAGNOSIS — R29898 Other symptoms and signs involving the musculoskeletal system: Secondary | ICD-10-CM | POA: Diagnosis not present

## 2021-11-22 DIAGNOSIS — K2970 Gastritis, unspecified, without bleeding: Secondary | ICD-10-CM | POA: Diagnosis not present

## 2021-11-22 DIAGNOSIS — C7951 Secondary malignant neoplasm of bone: Secondary | ICD-10-CM | POA: Diagnosis not present

## 2021-11-22 MED ORDER — MUSCLE RUB 10-15 % EX CREA
TOPICAL_CREAM | Freq: Four times a day (QID) | CUTANEOUS | Status: DC | PRN
  Filled 2021-11-22: qty 85

## 2021-11-22 MED ORDER — ALUM & MAG HYDROXIDE-SIMETH 200-200-20 MG/5ML PO SUSP
30.0000 mL | Freq: Four times a day (QID) | ORAL | Status: DC | PRN
  Administered 2021-11-22: 30 mL via ORAL
  Filled 2021-11-22: qty 30

## 2021-11-22 NOTE — Progress Notes (Signed)
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  Neurology and oncology were consulted.  Assessment & Plan:   Progressive lower extremity weakness Metastatic left breast cancer with extensive spinal metastasis/leptomeningeal carcinomatosis/hepatic metastases -Neurology recommended oncology input and possible neurosurgery evaluation after oncology input.  I had communicated with Dr. Cabbell/neurosurgery on phone on 11/17/2021: He reviewed patient's chart and imaging and apparently patient is not a neurosurgical candidate. -Oncology following and patient has been on Decadron.  Oncology has resumed patient's oral chemotherapy regimen: Patient will be taking her home supply of these medications.   -PT/OT now recommending SNF placement.  Social worker following.  Apparently, patient will not be accepted at SNF because of her current chemotherapy even though she can bring her own chemotherapy pills.  Patient will possibly need to go home with home health. -Chemotherapy scheduled for next week at cancer center will remain on hold till patient is physically able to go to cancer center.  Chemotherapy will not be provided inpatient as per oncology. -Patient will benefit from palliative care consultation as an outpatient. -Continue pain management and bowel regimen  Possible H. pylori gastritis -Was recently started on triple therapy as an outpatient.  Continue.  Outpatient follow-up  Anemia of chronic disease Macrocytosis -Hemoglobin stable.  Monitor intermittently.  Mildly elevated LFTs -ALT/AST mildly elevated: Improving.  Monitor  intermittently.  Hyponatremia -Improved.  DVT prophylaxis: Lovenox Code Status: Full Family Communication: Mother at bedside.  Communicated with the patient via sign interpreter/Mark Disposition Plan: Status is: Inpatient Remains inpatient appropriate because: Persistent weakness.  Will need home health.  Possible discharge on Monday Consultants: Neurology/oncology.  Palliative care.  Discussed with neurosurgery as well.  Procedures: None  Antimicrobials:  Anti-infectives (From admission, onward)    Start     Dose/Rate Route Frequency Ordered Stop   11/14/21 2330  clarithromycin (BIAXIN) tablet 500 mg       Note to Pharmacy: For 14 days     500 mg Oral 2 times daily 11/14/21 2308 11/28/21 2159   11/14/21 2315  metroNIDAZOLE (FLAGYL) tablet 500 mg        500 mg Oral 3 times daily 11/14/21 2308 11/28/21 2159        Subjective: Patient seen and examined at bedside.  Communicated via sign interpreter at bedside.  Denies worsening shortness of breath, vomiting, fever.   Objective: Vitals:   11/21/21 0557 11/21/21 1322 11/21/21 2110 11/22/21 0536  BP: 129/86 133/84 (!) 142/87 (!) 139/92  Pulse: 81 93 96 84  Resp: '17 16 17 17  '$ Temp: 97.7 F (36.5 C) 98.3 F (36.8 C) 98.3 F (36.8 C) 97.6 F (36.4 C)  TempSrc: Oral Oral Oral Oral  SpO2: 99% 100% 100% 99%  Weight:      Height:        Intake/Output Summary (Last 24 hours) at 11/22/2021 0741 Last data filed at 11/22/2021 0500 Gross per 24 hour  Intake --  Output 1550 ml  Net -1550 ml    Filed Weights   11/14/21 1622  Weight: 54 kg    Examination:  General exam: On room air.  No acute distress. Respiratory system: Bilateral decreased breath sounds at bases with no wheezing  cardiovascular system: Rate controlled;  S1-S2 heard  gastrointestinal system: Abdomen is slightly distended; soft and nontender.  Normal bowel sounds heard  extremities: Trace lower extremity edema present; no clubbing  Data Reviewed: I have  personally reviewed following labs and imaging studies  CBC: Recent Labs  Lab 11/16/21 0524 11/17/21 0459  WBC 5.5 6.7  HGB 10.0* 11.2*  HCT 31.3* 35.9*  MCV 102.3* 104.4*  PLT 173 419    Basic Metabolic Panel: Recent Labs  Lab 11/16/21 0524 11/17/21 0459  NA 134* 135  K 3.6 4.7  CL 104 103  CO2 23 24  GLUCOSE 103* 202*  BUN 9 9  CREATININE 0.58 0.59  CALCIUM 8.4* 9.2    GFR: Estimated Creatinine Clearance: 57.8 mL/min (by C-G formula based on SCr of 0.59 mg/dL). Liver Function Tests: Recent Labs  Lab 11/16/21 0524 11/17/21 0459  AST 29 28  ALT 50* 50*  ALKPHOS 99 121  BILITOT 0.5 0.2*  PROT 7.0 7.8  ALBUMIN 3.4* 3.6    No results for input(s): LIPASE, AMYLASE in the last 168 hours. No results for input(s): AMMONIA in the last 168 hours. Coagulation Profile: No results for input(s): INR, PROTIME in the last 168 hours. Cardiac Enzymes: No results for input(s): CKTOTAL, CKMB, CKMBINDEX, TROPONINI in the last 168 hours. BNP (last 3 results) No results for input(s): PROBNP in the last 8760 hours. HbA1C: No results for input(s): HGBA1C in the last 72 hours. CBG: No results for input(s): GLUCAP in the last 168 hours. Lipid Profile: No results for input(s): CHOL, HDL, LDLCALC, TRIG, CHOLHDL, LDLDIRECT in the last 72 hours. Thyroid Function Tests: No results for input(s): TSH, T4TOTAL, FREET4, T3FREE, THYROIDAB in the last 72 hours. Anemia Panel: No results for input(s): VITAMINB12, FOLATE, FERRITIN, TIBC, IRON, RETICCTPCT in the last 72 hours.  Sepsis Labs: No results for input(s): PROCALCITON, LATICACIDVEN in the last 168 hours.  Recent Results (from the past 240 hour(s))  Resp Panel by RT-PCR (Flu A&B, Covid) Nasopharyngeal Swab     Status: None   Collection Time: 11/14/21  9:10 PM   Specimen: Nasopharyngeal Swab; Nasopharyngeal(NP) swabs in vial transport medium  Result Value Ref Range Status   SARS Coronavirus 2 by RT PCR NEGATIVE NEGATIVE Final     Comment: (NOTE) SARS-CoV-2 target nucleic acids are NOT DETECTED.  The SARS-CoV-2 RNA is generally detectable in upper respiratory specimens during the acute phase of infection. The lowest concentration of SARS-CoV-2 viral copies this assay can detect is 138 copies/mL. A negative result does not preclude SARS-Cov-2 infection and should not be used as the sole basis for treatment or other patient management decisions. A negative result may occur with  improper specimen collection/handling, submission of specimen other than nasopharyngeal swab, presence of viral mutation(s) within the areas targeted by this assay, and inadequate number of viral copies(<138 copies/mL). A negative result must be combined with clinical observations, patient history, and epidemiological information. The expected result is Negative.  Fact Sheet for Patients:  EntrepreneurPulse.com.au  Fact Sheet for Healthcare Providers:  IncredibleEmployment.be  This test is no t yet approved or cleared by the [place] FDA and  has been authorized for detection and/or diagnosis of SARS-CoV-2 by FDA under an Emergency Use Authorization (EUA). This EUA will remain  in effect (meaning this test can be used) for the duration of the COVID-19 declaration under Section 564(b)(1) of the Act, 21 U.S.C.section 360bbb-3(b)(1), unless the authorization is terminated  or revoked sooner.       Influenza A by PCR  NEGATIVE NEGATIVE Final   Influenza B by PCR NEGATIVE NEGATIVE Final    Comment: (NOTE) The Xpert Xpress SARS-CoV-2/FLU/RSV plus assay is intended as an aid in the diagnosis of influenza from Nasopharyngeal swab specimens and should not be used as a sole basis for treatment. Nasal washings and aspirates are unacceptable for Xpert Xpress SARS-CoV-2/FLU/RSV testing.  Fact Sheet for Patients: EntrepreneurPulse.com.au  Fact Sheet for Healthcare  Providers: IncredibleEmployment.be  This test is not yet approved or cleared by the [place] FDA and has been authorized for detection and/or diagnosis of SARS-CoV-2 by FDA under an Emergency Use Authorization (EUA). This EUA will remain in effect (meaning this test can be used) for the duration of the COVID-19 declaration under Section 564(b)(1) of the Act, 21 U.S.C. section 360bbb-3(b)(1), unless the authorization is terminated or revoked.  Performed at [facility], [place] [address] [place], [place] [address]           Radiology Studies: No results found.      Scheduled Meds:  capecitabine  1,000 mg Oral BID PC   Chlorhexidine Gluconate Cloth  6 each Topical Daily   clarithromycin  500 mg Oral BID   dexamethasone (DECADRON) injection  4 mg Intravenous Q6H   enoxaparin (LOVENOX) injection  40 mg Subcutaneous Q24H   famotidine  20 mg Oral BID   gabapentin  100 mg Oral TID   memantine  10 mg Oral BID   metroNIDAZOLE  500 mg Oral TID   pantoprazole  40 mg Oral Daily   senna-docusate  1 tablet Oral BID   sodium chloride flush  3 mL Intravenous Q12H   tucatinib  300 mg Oral BID   Continuous Infusions:        Aline August, MD [facility] 11/22/2021, 7:41 AM

## 2021-11-23 DIAGNOSIS — C7949 Secondary malignant neoplasm of other parts of nervous system: Secondary | ICD-10-CM | POA: Diagnosis not present

## 2021-11-23 DIAGNOSIS — C50512 Malignant neoplasm of lower-outer quadrant of left female breast: Secondary | ICD-10-CM | POA: Diagnosis not present

## 2021-11-23 DIAGNOSIS — R29898 Other symptoms and signs involving the musculoskeletal system: Secondary | ICD-10-CM | POA: Diagnosis not present

## 2021-11-23 DIAGNOSIS — C7951 Secondary malignant neoplasm of bone: Secondary | ICD-10-CM | POA: Diagnosis not present

## 2021-11-23 NOTE — Progress Notes (Signed)
Occupational Therapy Treatment ?Patient Details ?Name: Erin [name] ?MRN: [id number] ?DOB: 1961/05/30 ?Today's Date: 11/23/2021 ? ? ?History of present illness Patient is a 61 year old female who presented with increased weakness in BLE and falls. 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 PMH: breast cancet metastasis, bone metastasis, ovarian CA, chemo 11/05/21, radiation enduced esophagitis, gasteritis. ?  ?OT comments ? Treatment focused on working on sit to stands needed for safe transfers and standing for ADLs. Use of stedy as a knee blocking device. Patient performed 8 sit to stands holding standing position for 10,15, 20 seconds at a time. Patient also performed exercises at edge of bed to work on regaining strength. Performed exercises in supine as well educating patient on how to perform on how own using pseudo leg lifter to assist. Use of ASL used during treatment. Patient very motivated and wanting to be able to transfer again. She was somewhat despondent at end of treatment when asking "what am I going to do?" In regards to getting therapy. ?Patient asking for more exercises and therapy and demonstrates ability to tolerate all activities. Patient is very motivated to be mobile again and has family support. Therapist recommends aggressive inpatient rehab at discharge to maximize her physical and functional abilities prior to discharge.  ? ?Recommendations for follow up therapy are one component of a multi-disciplinary discharge planning process, led by the attending physician.  Recommendations may be updated based on patient status, additional functional criteria and insurance authorization. ?   ?Follow Up Recommendations ? Acute inpatient rehab (3hours/day)  ?  ?Assistance Recommended at Discharge Frequent or constant Supervision/Assistance  ?Patient can return home with the following ? Assistance with cooking/housework;A lot of help with bathing/dressing/bathroom;A lot of help with  walking and/or transfers;Direct supervision/assist for financial management;Assist for transportation;Help with stairs or ramp for entrance;Direct supervision/assist for medications management ?  ?Equipment Recommendations ? BSC/3in1  ?  ?Recommendations for Other Services Rehab consult ? ?  ?Precautions / Restrictions Precautions ?Precautions: Fall ?Precaution Comments: ASL interpreter needed; high fall risk ?Restrictions ?Weight Bearing Restrictions: No  ? ? ?  ? ?Mobility Bed Mobility ?Overal bed mobility: Needs Assistance ?Bed Mobility: Supine to Sit, Sit to Supine ?  ?  ?Supine to sit: Min guard, HOB elevated, +2 for safety/equipment ?Sit to supine: Mod assist, +2 for safety/equipment ?  ?General bed mobility comments: MIn guard and increased time to transfer to side of bed. Patient using UEs to assist wtih LEs. To return to bed ?  ? ?Transfers ?  ?  ?  ?  ?  ?  ?  ?  ?  ?General transfer comment: +2 for safety but predominantly min guard to min assist for 8 sit to stands with use of stedy - working on standing for 10,15,20 seconds at a time. ?  ?  ?Balance Overall balance assessment: Needs assistance ?Sitting-balance support: No upper extremity supported ?Sitting balance-Leahy Scale: Fair ?  ?  ?Standing balance support: Bilateral upper extremity supported, Reliant on assistive device for balance ?Standing balance-Leahy Scale: Poor ?  ?  ?  ?  ?  ?  ?  ?  ?  ?  ?  ?  ?   ? ?ADL either performed or assessed with clinical judgement  ? ?ADL Overall ADL's : Needs assistance/impaired ?  ?  ?  ?  ?  ?  ?  ?  ?  ?  ?  ?  ?  ?  ?  ?  ?  ?  ?  ?  ?  ? ?  Extremity/Trunk Assessment Upper Extremity Assessment ?Upper Extremity Assessment: Overall WFL for tasks assessed ?  ?  ?  ?  ?  ? ?Vision Patient Visual Report: No change from baseline ?  ?  ?Perception   ?  ?Praxis   ?  ? ?Cognition Arousal/Alertness: Awake/alert ?Behavior During Therapy: [facility] for tasks assessed/performed ?Overall Cognitive Status: Within Functional  Limits for tasks assessed ?  ?  ?  ?  ?  ?  ?  ?  ?  ?  ?  ?  ?  ?  ?  ?  ?General Comments: ASL Intrepreter present ?  ?  ?   ?Exercises Other Exercises ?Other Exercises: EOB: Hip Hike x 5 each leg, knee extension x 10 each leg ?Other Exercises: In bed: heel slides with use of leg liftter to assist, abduction reps, glute squeezes, knee extension on pillow and ankle pumps. Extensive education and demonstration via use of intepreter to educate patient on how to perform bed exercises correctly. ? ?  ?Shoulder Instructions   ? ? ?  ?General Comments    ? ? ?Pertinent Vitals/ Pain       Pain Assessment ?Pain Assessment: No/denies pain ? ?Home Living   ?  ?  ?  ?  ?  ?  ?  ?  ?  ?  ?  ?  ?  ?  ?  ?  ?  ?  ? ?  ?Prior Functioning/Environment    ?  ?  ?  ?   ? ?Frequency ? Min 2X/week  ? ? ? ? ?  ?Progress Toward Goals ? ?OT Goals(current goals can now be found in the care plan section) ? Progress towards OT goals: Progressing toward goals ? ?Acute Rehab OT Goals ?Patient Stated Goal: to transfer herself to [facility] ?OT Goal Formulation: With patient ?Time For Goal Achievement: 11/29/21  ?Plan Discharge plan remains appropriate   ? ?Co-evaluation ? ? ?   ?  ?  ?  ?  ? ?  ?AM-PAC OT "6 Clicks" Daily Activity     ?Outcome Measure ? ? Help from another person eating meals?: A Little ?Help from another person taking care of personal grooming?: A Little ?Help from another person toileting, which includes using toliet, bedpan, or urinal?: A Lot ?Help from another person bathing (including washing, rinsing, drying)?: A Lot ?Help from another person to put on and taking off regular upper body clothing?: A Little ?Help from another person to put on and taking off regular lower body clothing?: A Lot ?6 Click Score: 15 ? ?  ?End of Session Equipment Utilized During Treatment: Other (comment) (stedy) ? ?OT Visit Diagnosis: Unsteadiness on feet (R26.81);Other abnormalities of gait and mobility (R26.89);Muscle weakness (generalized)  (M62.81);Repeated falls (R29.6) ?  ?Activity Tolerance Patient tolerated treatment well ?  ?Patient Left in bed;with call bell/phone within reach ?  ?Nurse Communication Mobility status ?  ? ?   ? ?Time: 9417-4081 ?OT Time Calculation (min): 52 min ? ?Charges: OT General Charges ?$OT Visit: 1 Visit ?OT Treatments ?$Therapeutic Activity: 23-37 mins ?$Therapeutic Exercise: 8-22 mins ? ?Jp Eastham, OTR/L ?Acute Care Rehab Services  ?Office [phone] ?Pager: [phone]  ? ?Cayci Mcnabb L Annsleigh Dragoo ?11/23/2021, 12:50 PM ?

## 2021-11-23 NOTE — Progress Notes (Signed)
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  Neurology and oncology were consulted.  Assessment & Plan:   Progressive lower extremity weakness Metastatic left breast cancer with extensive spinal metastasis/leptomeningeal carcinomatosis/hepatic metastases -Neurology recommended oncology input and possible neurosurgery evaluation after oncology input.  I had communicated with Dr. Cabbell/neurosurgery on phone on 11/17/2021: He reviewed patient's chart and imaging and apparently patient is not a neurosurgical candidate. -Oncology following and patient has been on Decadron.  Oncology has resumed patient's oral chemotherapy regimen: Patient will be taking her home supply of these medications.   -PT/OT now recommending SNF placement.  Social worker following.  Apparently, patient will not be accepted at SNF because of her current chemotherapy even though she can bring her own chemotherapy pills.  Patient will possibly need to go home with home health. -Chemotherapy scheduled for next week at cancer center will remain on hold till patient is physically able to go to cancer center.  Chemotherapy will not be provided inpatient as per oncology. -Patient will benefit from palliative care consultation as an outpatient. -Continue pain management and bowel regimen  Possible H. pylori gastritis -Was recently started on triple therapy as an outpatient.  Continue.  Outpatient follow-up  Anemia of chronic disease Macrocytosis -Hemoglobin stable.  Monitor intermittently.  Mildly elevated LFTs -ALT/AST mildly elevated: Improving.  Monitor  intermittently.  Hyponatremia -Improved.  DVT prophylaxis: Lovenox Code Status: Full Family Communication: Mother at bedside.  Communicated with the patient via sign interpreter/Mark Disposition Plan: Status is: Inpatient Remains inpatient appropriate because: Persistent weakness.  Will need home health.  Possible discharge on Monday Consultants: Neurology/oncology.  Palliative care.  Discussed with neurosurgery as well.  Procedures: None  Antimicrobials:  Anti-infectives (From admission, onward)    Start     Dose/Rate Route Frequency Ordered Stop   11/14/21 2330  clarithromycin (BIAXIN) tablet 500 mg       Note to Pharmacy: For 14 days     500 mg Oral 2 times daily 11/14/21 2308 11/28/21 2159   11/14/21 2315  metroNIDAZOLE (FLAGYL) tablet 500 mg        500 mg Oral 3 times daily 11/14/21 2308 11/28/21 2159        Subjective: Patient seen and examined at bedside.  Communicated via sign interpreter at bedside.  No fever, worsening shortness of breath, vomiting or abdominal pain reported.   Objective: Vitals:   11/22/21 0536 11/22/21 1358 11/22/21 2008 11/23/21 0443  BP: (!) 139/92 140/90 140/86 (!) 152/96  Pulse: 84 88 82 83  Resp: '17 14 14 14  '$ Temp: 97.6 F (36.4 C) 97.9 F (36.6 C) 97.9 F (36.6 C) 98 F (36.7 C)  TempSrc: Oral Oral Oral Oral  SpO2: 99% 100% 100% 100%  Weight:      Height:        Intake/Output Summary (Last 24 hours) at 11/23/2021 0821 Last data filed at 11/23/2021 0818 Gross per 24 hour  Intake 240 ml  Output 2200 ml  Net -1960 ml    Filed Weights   11/14/21 1622  Weight: 54 kg    Examination:  General exam: No distress.  Currently on room air. Respiratory system: Decreased breath sounds at bases bilaterally  cardiovascular system:  S1-S2 heard; currently rate controlled  gastrointestinal system: Abdomen is distended mildly; soft and nontender.  Bowel sounds are heard  extremities: No cyanosis; mild lower extremity edema  present  Data Reviewed: I have personally reviewed following labs and imaging studies  CBC: Recent Labs  Lab 11/17/21 0459  WBC 6.7  HGB 11.2*  HCT 35.9*  MCV 104.4*  PLT 035    Basic Metabolic Panel: Recent Labs  Lab 11/17/21 0459  NA 135  K 4.7  CL 103  CO2 24  GLUCOSE 202*  BUN 9  CREATININE 0.59  CALCIUM 9.2    GFR: Estimated Creatinine Clearance: 57.8 mL/min (by C-G formula based on SCr of 0.59 mg/dL). Liver Function Tests: Recent Labs  Lab 11/17/21 0459  AST 28  ALT 50*  ALKPHOS 121  BILITOT 0.2*  PROT 7.8  ALBUMIN 3.6    No results for input(s): LIPASE, AMYLASE in the last 168 hours. No results for input(s): AMMONIA in the last 168 hours. Coagulation Profile: No results for input(s): INR, PROTIME in the last 168 hours. Cardiac Enzymes: No results for input(s): CKTOTAL, CKMB, CKMBINDEX, TROPONINI in the last 168 hours. BNP (last 3 results) No results for input(s): PROBNP in the last 8760 hours. HbA1C: No results for input(s): HGBA1C in the last 72 hours. CBG: No results for input(s): GLUCAP in the last 168 hours. Lipid Profile: No results for input(s): CHOL, HDL, LDLCALC, TRIG, CHOLHDL, LDLDIRECT in the last 72 hours. Thyroid Function Tests: No results for input(s): TSH, T4TOTAL, FREET4, T3FREE, THYROIDAB in the last 72 hours. Anemia Panel: No results for input(s): VITAMINB12, FOLATE, FERRITIN, TIBC, IRON, RETICCTPCT in the last 72 hours.  Sepsis Labs: No results for input(s): PROCALCITON, LATICACIDVEN in the last 168 hours.  Recent Results (from the past 240 hour(s))  Resp Panel by RT-PCR (Flu A&B, Covid) Nasopharyngeal Swab     Status: None   Collection Time: 11/14/21  9:10 PM   Specimen: Nasopharyngeal Swab; Nasopharyngeal(NP) swabs in vial transport medium  Result Value Ref Range Status   SARS Coronavirus 2 by RT PCR NEGATIVE NEGATIVE Final    Comment: (NOTE) SARS-CoV-2 target nucleic acids are NOT DETECTED.  The SARS-CoV-2 RNA is  generally detectable in upper respiratory specimens during the acute phase of infection. The lowest concentration of SARS-CoV-2 viral copies this assay can detect is 138 copies/mL. A negative result does not preclude SARS-Cov-2 infection and should not be used as the sole basis for treatment or other patient management decisions. A negative result may occur with  improper specimen collection/handling, submission of specimen other than nasopharyngeal swab, presence of viral mutation(s) within the areas targeted by this assay, and inadequate number of viral copies(<138 copies/mL). A negative result must be combined with clinical observations, patient history, and epidemiological information. The expected result is Negative.  Fact Sheet for Patients:  EntrepreneurPulse.com.au  Fact Sheet for Healthcare Providers:  IncredibleEmployment.be  This test is no t yet approved or cleared by the [place] FDA and  has been authorized for detection and/or diagnosis of SARS-CoV-2 by FDA under an Emergency Use Authorization (EUA). This EUA will remain  in effect (meaning this test can be used) for the duration of the COVID-19 declaration under Section 564(b)(1) of the Act, 21 U.S.C.section 360bbb-3(b)(1), unless the authorization is terminated  or revoked sooner.       Influenza A by PCR NEGATIVE NEGATIVE Final   Influenza B by PCR NEGATIVE NEGATIVE Final    Comment: (NOTE) The Xpert Xpress SARS-CoV-2/FLU/RSV plus  assay is intended as an aid in the diagnosis of influenza from Nasopharyngeal swab specimens and should not be used as a sole basis for treatment. Nasal washings and aspirates are unacceptable for Xpert Xpress SARS-CoV-2/FLU/RSV testing.  Fact Sheet for Patients: EntrepreneurPulse.com.au  Fact Sheet for Healthcare Providers: IncredibleEmployment.be  This test is not yet approved or cleared by the [place] FDA and has been authorized for detection and/or diagnosis of SARS-CoV-2 by FDA under an Emergency Use Authorization (EUA). This EUA will remain in effect (meaning this test can be used) for the duration of the COVID-19 declaration under Section 564(b)(1) of the Act, 21 U.S.C. section 360bbb-3(b)(1), unless the authorization is terminated or revoked.  Performed at [facility], [place] [address] [place], [place] [address]           Radiology Studies: No results found.      Scheduled Meds:  capecitabine  1,000 mg Oral BID PC   Chlorhexidine Gluconate Cloth  6 each Topical Daily   clarithromycin  500 mg Oral BID   dexamethasone (DECADRON) injection  4 mg Intravenous Q6H   enoxaparin (LOVENOX) injection  40 mg Subcutaneous Q24H   famotidine  20 mg Oral BID   gabapentin  100 mg Oral TID   memantine  10 mg Oral BID   metroNIDAZOLE  500 mg Oral TID   pantoprazole  40 mg Oral Daily   senna-docusate  1 tablet Oral BID   sodium chloride flush  3 mL Intravenous Q12H   tucatinib  300 mg Oral BID   Continuous Infusions:        Aline August, MD [facility] 11/23/2021, 8:21 AM

## 2021-11-24 ENCOUNTER — Other Ambulatory Visit: Payer: Self-pay | Admitting: Hematology and Oncology

## 2021-11-24 ENCOUNTER — Encounter: Payer: Self-pay | Admitting: Hematology and Oncology

## 2021-11-24 ENCOUNTER — Other Ambulatory Visit (HOSPITAL_COMMUNITY): Payer: Self-pay

## 2021-11-24 DIAGNOSIS — C7949 Secondary malignant neoplasm of other parts of nervous system: Secondary | ICD-10-CM | POA: Diagnosis not present

## 2021-11-24 DIAGNOSIS — Z171 Estrogen receptor negative status [ER-]: Secondary | ICD-10-CM

## 2021-11-24 DIAGNOSIS — C50512 Malignant neoplasm of lower-outer quadrant of left female breast: Secondary | ICD-10-CM

## 2021-11-24 DIAGNOSIS — R29898 Other symptoms and signs involving the musculoskeletal system: Secondary | ICD-10-CM | POA: Diagnosis not present

## 2021-11-24 DIAGNOSIS — C7951 Secondary malignant neoplasm of bone: Secondary | ICD-10-CM | POA: Diagnosis not present

## 2021-11-24 MED ORDER — MENTHOL 3 MG MT LOZG
1.0000 | LOZENGE | Freq: Four times a day (QID) | OROMUCOSAL | Status: DC | PRN
  Filled 2021-11-24 (×2): qty 9

## 2021-11-24 MED ORDER — CAPECITABINE 500 MG PO TABS
1000.0000 mg | ORAL_TABLET | Freq: Two times a day (BID) | ORAL | 3 refills | Status: DC
  Filled 2021-11-24: qty 56, 14d supply, fill #0
  Filled 2021-11-25: qty 56, 21d supply, fill #0

## 2021-11-24 MED ORDER — TUCATINIB 150 MG PO TABS
300.0000 mg | ORAL_TABLET | Freq: Two times a day (BID) | ORAL | 3 refills | Status: DC
  Filled 2021-11-24 – 2021-11-25 (×2): qty 120, 30d supply, fill #0

## 2021-11-24 NOTE — Progress Notes (Signed)
Physical Therapy Treatment ?Patient Details ?Name: Erin [name] ?MRN: [id number] ?DOB: 11-Oct-1960 ?Today's Date: 11/24/2021 ? ? ?History of Present Illness Patient is a 61 year old female who presented with increased weakness in BLE and falls. 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 PMH: breast cancet metastasis, bone metastasis, ovarian CA, chemo 11/05/21, radiation enduced esophagitis, gasteritis. ? ?  ?PT Comments  ? ? General Comments: Mom present to Interpret Sign Language.  Pt AxO x 3 feeling "weak" and "trired" ?Pt present with increased ability to actively move her R LE in bed.  Pt did NOT have to use her leg lifter to slide R LE to EOB.  General transfer comment: first assisted from elevated bed to [facility] pivot transfer no AD with excessive B UE support to complete 1/4 pivot turn.  Then used STEDY to perform sit to stand.  Pt self able to pull herself up to upright stance in STEDY.  Performed X10 resps while engaging core. General Gait Details: performed per gait TE's  sit to stand in STEDY.  Positioned in recliner to comfort.    ?Recommendations for follow up therapy are one component of a multi-disciplinary discharge planning process, led by the attending physician.  Recommendations may be updated based on patient status, additional functional criteria and insurance authorization. ? ?Follow Up Recommendations ? Acute inpatient rehab (3hours/day) (per chart review) ?  ?  ?Assistance Recommended at Discharge Frequent or constant Supervision/Assistance  ?Patient can return home with the following Two people to help with bathing/dressing/bathroom;A lot of help with bathing/dressing/bathroom;Help with stairs or ramp for entrance;Assist for transportation;Assistance with cooking/housework ?  ?Equipment Recommendations ? (P) Wheelchair (measurements PT);Wheelchair cushion (measurements PT)  ?  ?Recommendations for Other Services   ? ? ?  ?Precautions / Restrictions Precautions ?Precautions:  Fall ?Precaution Comments: ASL interpreter needed; high fall risk ?Restrictions ?Weight Bearing Restrictions: No  ?  ? ?Mobility ? Bed Mobility ?Overal bed mobility: Needs Assistance ?Bed Mobility: Supine to Sit ?  ?  ?Supine to sit: Min assist, Min guard ?  ?  ?General bed mobility comments: MIn guard and increased time to transfer to side of bed. Patient using UEs to assist wtih LEs. ?  ? ?Transfers ?Overall transfer level: Needs assistance ?Equipment used: None ?Transfers: Bed to chair/wheelchair/BSC, Sit to/from Stand ?Sit to Stand: Max assist, From elevated surface ?Stand pivot transfers: Mod assist, Max assist ?  ?  ?  ?  ?General transfer comment: first assisted from elevated bed to [facility] pivot transfer no AD with excessive B UE support to complete 1/4 pivot turn.  Then used STEDY to perform sit to stand.  Pt self able to pull herself up to upright stance in STEDY.  Performed X10 resps while engaging core. ?Transfer via Lift Equipment: Stedy ? ?Ambulation/Gait ?  ?  ?  ?  ?  ?  ?  ?General Gait Details: performed per gait TE's  sit to stand in STEDY ? ? ?Stairs ?  ?  ?  ?  ?  ? ? ?Wheelchair Mobility ?  ? ?Modified Rankin (Stroke Patients Only) ?  ? ? ?  ?Balance   ?  ?  ?  ?  ?  ?  ?  ?  ?  ?  ?  ?  ?  ?  ?  ?  ?  ?  ?  ? ?  ?Cognition Arousal/Alertness: Awake/alert ?Behavior During Therapy: [facility] for tasks assessed/performed ?Overall Cognitive Status: Within Functional Limits for tasks assessed ?  ?  ?  ?  ?  ?  ?  ?  ?  ?  ?  ?  ?  ?  ?  ?  ?  General Comments: Mom present to Interpret Sign Language.  Pt AxO x 3 feeling "weak" and "trired" ?  ?  ? ?  ?Exercises   ? ?  ?General Comments   ?  ?  ? ?Pertinent Vitals/Pain Pain Assessment ?Pain Assessment: No/denies pain  ? ? ?Home Living   ?  ?  ?  ?  ?  ?  ?  ?  ?  ?   ?  ?Prior Function    ?  ?  ?   ? ?PT Goals (current goals can now be found in the care plan section)   ? ?  ?Frequency ? ? ? Min 2X/week ? ? ? ?  ?PT Plan Current plan remains appropriate   ? ? ?Co-evaluation   ?  ?  ?  ?  ? ?  ?AM-PAC PT "6 Clicks" Mobility   ?Outcome Measure ? Help needed turning from your back to your side while in a flat bed without using bedrails?: A Little ?Help needed moving from lying on your back to sitting on the side of a flat bed without using bedrails?: A Little ?Help needed moving to and from a bed to a chair (including a wheelchair)?: A Lot ?Help needed standing up from a chair using your arms (e.g., wheelchair or bedside chair)?: A Lot ?Help needed to walk in hospital room?: Total ?Help needed climbing 3-5 steps with a railing? : Total ?6 Click Score: 12 ? ?  ?End of Session Equipment Utilized During Treatment: Gait belt ?Activity Tolerance: Patient limited by fatigue ?Patient left: in chair;with call bell/phone within reach;with chair alarm set;with family/visitor present ?Nurse Communication: Mobility status ?PT Visit Diagnosis: Unsteadiness on feet (R26.81);Muscle weakness (generalized) (M62.81);Other abnormalities of gait and mobility (R26.89) ?  ? ? ?Time: 0950-1009 ?PT Time Calculation (min) (ACUTE ONLY): 19 min ? ?Charges:  $Therapeutic Activity: 8-22 mins          ?          ? ?{Sahith Nurse  PTA ?Acute  Rehabilitation Services ?Pager      [phone] ?Office      [phone] ? ? ?

## 2021-11-24 NOTE — Progress Notes (Signed)
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  Neurology and oncology were consulted.  Assessment & Plan:   Progressive lower extremity weakness Metastatic left breast cancer with extensive spinal metastasis/leptomeningeal carcinomatosis/hepatic metastases -Neurology recommended oncology input and possible neurosurgery evaluation after oncology input.  I had communicated with Dr. Cabbell/neurosurgery on phone on 11/17/2021: He reviewed patient's chart and imaging and apparently patient is not a neurosurgical candidate. -Oncology following and patient has been on Decadron.  Oncology has resumed patient's oral chemotherapy regimen: Patient will be taking her home supply of these medications.   -Apparently, patient will not be accepted at SNF because of her current chemotherapy even though she can bring her own chemotherapy pills.   -PT is now recommending CIR placement.  CIR consulted. -Patient will benefit from palliative care consultation as an outpatient. -Continue pain management and bowel regimen  Possible H. pylori gastritis -Was recently started on triple therapy as an outpatient.  Continue.  Outpatient follow-up  Anemia of chronic disease Macrocytosis -Hemoglobin stable.  Monitor intermittently.  Mildly elevated LFTs -ALT/AST mildly elevated: Improving.  Monitor intermittently.  Hyponatremia -Improved.  DVT prophylaxis: Lovenox Code Status: Full Family Communication: Mother at bedside.  Communicated with the patient via sign interpreter/Kathy Disposition Plan: Status is: Inpatient Remains inpatient appropriate because:  Persistent weakness.  Possible CIR placement.  Consultants: Neurology/oncology.  Palliative care.  Discussed with neurosurgery as well.  Procedures: None  Antimicrobials:  Anti-infectives (From admission, onward)    Start     Dose/Rate Route Frequency Ordered Stop   11/14/21 2330  clarithromycin (BIAXIN) tablet 500 mg       Note to Pharmacy: For 14 days     500 mg Oral 2 times daily 11/14/21 2308 11/28/21 2159   11/14/21 2315  metroNIDAZOLE (FLAGYL) tablet 500 mg        500 mg Oral 3 times daily 11/14/21 2308 11/28/21 2159        Subjective: Patient seen and examined at bedside.  Communicated via sign interpreter at bedside.  Denies chest pain, worsening shortness of breath, fever or vomiting.   Objective: Vitals:   11/23/21 0443 11/23/21 1409 11/23/21 2031 11/24/21 0429  BP: (!) 152/96 (!) 144/93 (!) 158/98 (!) 159/96  Pulse: 83 90 92 82  Resp: '14 14 16 14  '$ Temp: 98 F (36.7 C) 97.8 F (36.6 C) 98.2 F (36.8 C) 97.8 F (36.6 C)  TempSrc: Oral Oral Oral Oral  SpO2: 100% 98% 100% 100%  Weight:      Height:        Intake/Output Summary (Last 24 hours) at 11/24/2021 0956 Last data filed at 11/24/2021 0432 Gross per 24 hour  Intake 120 ml  Output 975 ml  Net -855 ml    Filed Weights   11/14/21 1622  Weight: 54 kg    Examination:  General exam: On room air currently.  No acute distress.   Respiratory system: Bilateral decreased breath sounds at bases, no wheezing  cardiovascular system: Rate controlled; S1-S2 heard gastrointestinal system: Abdomen is slightly distended; soft and nontender.  Normal bowel sounds heard  extremities: Trace lower extremity edema present; no clubbing  Data Reviewed: I have personally reviewed following labs and imaging studies  CBC:  No results for input(s): WBC, NEUTROABS, HGB, HCT, MCV, PLT in the last 168 hours.  Basic Metabolic Panel: No results for input(s): NA, K, CL, CO2, GLUCOSE, BUN, CREATININE, CALCIUM, MG, PHOS in the last  168 hours.  GFR: Estimated Creatinine Clearance: 57.8 mL/min (by C-G formula based on SCr of 0.59 mg/dL). Liver Function Tests: No results for input(s): AST, ALT, ALKPHOS, BILITOT, PROT, ALBUMIN in the last 168 hours.  No results for input(s): LIPASE, AMYLASE in the last 168 hours. No results for input(s): AMMONIA in the last 168 hours. Coagulation Profile: No results for input(s): INR, PROTIME in the last 168 hours. Cardiac Enzymes: No results for input(s): CKTOTAL, CKMB, CKMBINDEX, TROPONINI in the last 168 hours. BNP (last 3 results) No results for input(s): PROBNP in the last 8760 hours. HbA1C: No results for input(s): HGBA1C in the last 72 hours. CBG: No results for input(s): GLUCAP in the last 168 hours. Lipid Profile: No results for input(s): CHOL, HDL, LDLCALC, TRIG, CHOLHDL, LDLDIRECT in the last 72 hours. Thyroid Function Tests: No results for input(s): TSH, T4TOTAL, FREET4, T3FREE, THYROIDAB in the last 72 hours. Anemia Panel: No results for input(s): VITAMINB12, FOLATE, FERRITIN, TIBC, IRON, RETICCTPCT in the last 72 hours.  Sepsis Labs: No results for input(s): PROCALCITON, LATICACIDVEN in the last 168 hours.  Recent Results (from the past 240 hour(s))  Resp Panel by RT-PCR (Flu A&B, Covid) Nasopharyngeal Swab     Status: None   Collection Time: 11/14/21  9:10 PM   Specimen: Nasopharyngeal Swab; Nasopharyngeal(NP) swabs in vial transport medium  Result Value Ref Range Status   SARS Coronavirus 2 by RT PCR NEGATIVE NEGATIVE Final    Comment: (NOTE) SARS-CoV-2 target nucleic acids are NOT DETECTED.  The SARS-CoV-2 RNA is generally detectable in upper respiratory specimens during the acute phase of infection. The lowest concentration of SARS-CoV-2 viral copies this assay can detect is 138 copies/mL. A negative result does not preclude SARS-Cov-2 infection and should not be used as the sole basis for treatment or other patient management decisions. A negative  result may occur with  improper specimen collection/handling, submission of specimen other than nasopharyngeal swab, presence of viral mutation(s) within the areas targeted by this assay, and inadequate number of viral copies(<138 copies/mL). A negative result must be combined with clinical observations, patient history, and epidemiological information. The expected result is Negative.  Fact Sheet for Patients:  EntrepreneurPulse.com.au  Fact Sheet for Healthcare Providers:  IncredibleEmployment.be  This test is no t yet approved or cleared by the [place] FDA and  has been authorized for detection and/or diagnosis of SARS-CoV-2 by FDA under an Emergency Use Authorization (EUA). This EUA will remain  in effect (meaning this test can be used) for the duration of the COVID-19 declaration under Section 564(b)(1) of the Act, 21 U.S.C.section 360bbb-3(b)(1), unless the authorization is terminated  or revoked sooner.       Influenza A by PCR NEGATIVE NEGATIVE Final   Influenza B by PCR NEGATIVE NEGATIVE Final    Comment: (NOTE) The Xpert Xpress SARS-CoV-2/FLU/RSV plus assay is intended as an aid in the diagnosis of influenza from Nasopharyngeal swab specimens and should not be used as a sole basis for treatment. Nasal washings and aspirates are unacceptable for Xpert Xpress SARS-CoV-2/FLU/RSV testing.  Fact Sheet for Patients: EntrepreneurPulse.com.au  Fact Sheet for Healthcare Providers: IncredibleEmployment.be  This test is not yet approved or cleared by the [place] FDA and has been authorized for detection and/or diagnosis of SARS-CoV-2 by  FDA under an Emergency Use Authorization (EUA). This EUA will remain in effect (meaning this test can be used) for the duration of the COVID-19 declaration under Section 564(b)(1) of the Act, 21 U.S.C. section 360bbb-3(b)(1), unless the authorization is  terminated or revoked.  Performed at [facility], [place] [address], [place], [place] [address]           Radiology Studies: No results found.      Scheduled Meds:  capecitabine  1,000 mg Oral BID PC   Chlorhexidine Gluconate Cloth  6 each Topical Daily   clarithromycin  500 mg Oral BID   dexamethasone (DECADRON) injection  4 mg Intravenous Q6H   enoxaparin (LOVENOX) injection  40 mg Subcutaneous Q24H   famotidine  20 mg Oral BID   gabapentin  100 mg Oral TID   memantine  10 mg Oral BID   metroNIDAZOLE  500 mg Oral TID   pantoprazole  40 mg Oral Daily   senna-docusate  1 tablet Oral BID   sodium chloride flush  3 mL Intravenous Q12H   tucatinib  300 mg Oral BID   Continuous Infusions:        Aline August, MD [facility] 11/24/2021, 9:56 AM

## 2021-11-24 NOTE — Progress Notes (Incomplete)
HEMATOLOGY-ONCOLOGY TELEPHONE VISIT PROGRESS NOTE  I connected with @PTNAME @ on 11/24/21 at  9:15 AM EDT by telephone and verified that I am speaking with the correct person using two identifiers.  I discussed the limitations, risks, security and privacy concerns of performing an evaluation and management service by telephone and the availability of in person appointments.  I also discussed with the patient that there may be a patient responsible charge related to this service. The patient expressed understanding and agreed to proceed.   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 She presents today to the clinic for a telephone follow-up.  [place] NOTE  Patient Care Team: [name], NP as PCP - General (Nurse Practitioner) Excell Seltzer, MD (Inactive) as Consulting Physician (General Surgery) [name], MD as Consulting Physician (Hematology and Oncology) Kyung Rudd, MD as Consulting Physician (Radiation Oncology) [name], MD as Consulting Physician (Gynecologic Oncology) Delice Bison, Charlestine Massed, NP as Nurse Practitioner (Hematology and Oncology) Mauro Kaufmann, RN as Oncology Nurse Navigator Rockwell Germany, RN as Oncology Nurse Navigator  CHIEF COMPLAINTS/PURPOSE OF CONSULTATION:  Follow-up of metastatic breast cancer   HISTORY OF PRESENTING ILLNESS:  Erin [name] 61 y.o. female is here to discuss viewed her records extensively and collaborated the history with the patient.  SUMMARY OF ONCOLOGIC HISTORY: Oncology History  Malignant neoplasm of lower-outer quadrant of left breast of female, estrogen receptor negative ([place])  05/11/2017 Initial Diagnosis   Left breast biopsy 3:30 position 6 cm from  nipple: IDC with DCIS, lymphovascular invasion present, grade 2-3, ER 0%, PR 0%, HER-2 positive ratio 2.28, Ki-67 20%, 1 cm lesion left breast, T1b N0 stage IA clinical stage   06/09/2017 Surgery   Left lumpectomy: IDC grade 3, 1.2 cm, DCIS is present, margins negative, 0/6 lymph nodes negative, ER 0%, PR 0%, HER-2 positive ratio 2.28, Ki-67 20%, T1 BN 0 stage IA   07/09/2017 - 09/29/2017 Adjuvant Chemotherapy   Taxol/Herceptin x 12 completed on 09/29/2017, went on to receive every 3 week adjuvant Herceptin through 06/28/2018   11/25/2017 - 01/07/2018 Radiation Therapy    The patient initially received a dose of 50.4 Gy in 28 fractions to the breast using whole-breast tangent fields. This was delivered using a 3-D conformal technique. The patient then received a boost to the seroma. This delivered an additional 10 Gy in 5 fractions using a 3-D technique. The total dose was 60.4 Gy.   02/15/2018 Genetic Testing   The Common Hereditary Cancer Panel offered by [facility] includes sequencing and/or deletion duplication testing of the following 47 genes: APC, ATM, AXIN2, BARD1, BMPR1A, BRCA1, BRCA2, BRIP1, CDH1, CDKN2A (p14ARF), CDKN2A (p16INK4a), CKD4, CHEK2, CTNNA1, DICER1, EPCAM (Deletion/duplication testing only), GREM1 (promoter region deletion/duplication testing only), KIT, MEN1, MLH1, MSH2, MSH3, MSH6, MUTYH, NBN, NF1, NHTL1, PALB2, PDGFRA, PMS2, POLD1, POLE, PTEN, RAD50, RAD51C, RAD51D, SDHB, SDHC, SDHD, SMAD4, SMARCA4. STK11, TP53, TSC1, TSC2, and VHL.  The following genes were evaluated for sequence changes only: SDHA and HOXB13 c.251G>A variant only.   Results: POSITIVE. 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 The date of this test report is 02/15/2018   10/20/2019 PET scan   Bone mets involving Right hip, Bil Iliac bones, T8, T9, L2 and L3, Rt prox humerus, Righ glenoid. Some lytic  and some sclerotic. Mild Left supraclav adenopathy, 2 small lesions basal ganglia (could be  lacunar infarcts)   11/14/2019 - 12/26/2019 Chemotherapy   The patient had palonosetron (ALOXI) injection 0.25 mg, 0.25 mg, Intravenous,  Once, 2 of 5 cycles Administration: 0.25 mg (12/05/2019), 0.25 mg (12/26/2019) ado-trastuzumab emtansine (KADCYLA) 260 mg in sodium chloride 0.9 % 250 mL chemo infusion, 3.6 mg/kg = 260 mg, Intravenous, Once, 3 of 6 cycles Administration: 260 mg (11/14/2019), 260 mg (12/05/2019), 260 mg (12/26/2019)   for chemotherapy treatment.     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  Required TPN briefly.  Patient was able to swallow and keep food down by the time of discharge.   02/06/2020 Miscellaneous   Talazoparib   07/15/2021 - 07/15/2021 Chemotherapy   Patient is on Treatment Plan : Intrathecal Methotrexate     08/14/2021 -  Chemotherapy   Patient is on Treatment Plan : BREAST Capecitabine + Trastuzumab + Tucatinib q21d     Fallopian tube cancer, carcinoma, right ([place])  11/04/2017 Surgery   Uterus, cervix and bilateral fallopian tubes: Fallopian tube with serous carcinoma 0.6 cm positive for p53, PAX8, WT-1, MOC-31, cytokeratin 7, and estrogen receptor   01/11/2018 -  Chemotherapy   Taxol and carboplatin every 3 weeks (Taxol discontinued with cycle 2 because of profound rash)   10/20/2019 PET scan   Bone mets involving Right hip, Bil Iliac bones, T8, T9, L2 and L3, Rt prox humerus, Righ glenoid. Some lytic and some sclerotic. Mild Left supraclav adenopathy, 2 small lesions basal ganglia (could be lacunar infarcts)   11/14/2019 - 12/26/2019 Chemotherapy   The patient had palonosetron (ALOXI) injection 0.25 mg, 0.25 mg, Intravenous,  Once, 2 of 5 cycles Administration: 0.25 mg (12/05/2019), 0.25 mg (12/26/2019) ado-trastuzumab emtansine (KADCYLA) 260 mg in sodium chloride 0.9 % 250 mL chemo infusion, 3.6 mg/kg = 260 mg, Intravenous,  Once, 3 of 6 cycles Administration: 260 mg (11/14/2019), 260 mg (12/05/2019), 260 mg (12/26/2019)   for chemotherapy treatment.     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    Breast cancer ([place]) 05/2017   left   Chronic back pain    Deaf    per pt born hearing and at age 72 1/2 lost hearing , was told by mother unknown cause, can miminally hear in left and no hearing on right    Depression    Elevated cancer antigen 125 (CA 125)    Family history of breast cancer    Family history of breast cancer    Family history of colon cancer    Genetic testing 02/17/2018   The Common Hereditary Cancer Panel offered by [facility] includes sequencing and/or deletion duplication testing of the following 47  genes: APC, ATM, AXIN2, BARD1, BMPR1A, BRCA1, BRCA2, BRIP1, CDH1, CDKN2A (p14ARF), CDKN2A (p16INK4a), CKD4, CHEK2, CTNNA1, DICER1, EPCAM (Deletion/duplication testing only), GREM1 (promoter region deletion/duplication testing only), KIT, MEN1, MLH1, MSH2, MSH3, MSH6, MU   GERD (gastroesophageal reflux disease)    History of cancer chemotherapy    left breast cancer 09-08-2017 to 10-09-2017;  fallopion tube cancer  12-15-2017  to 06-28-2018   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 oncologist-  dr [name]   11-04-2017  s/p  LAVH w/ BSO,  dx right fallopian tube carinoma (Stage 1C) in setting  Stage 1 breast cancer;  completed chemo 06-28-2018   History of external beam radiation therapy    left breast  11-25-2017  to 01-07-2018   Hyperlipidemia    Hypertension    followed by pcp   (10-05-2019  per pt never had stress test)   IDA (iron deficiency anemia)    Malignant neoplasm of lower-outer quadrant of left breast of female, estrogen receptor negative [facility]) oncologist-- dr [name]   dx 08/ 2018--- Stage IA, DCIS,  ER/ PR negative,  HER-2 positive;  06-09-2017 s/p left breast lumpectomy with node dissection;  completed chemo 10-09-2017  and radiation 01-07-2018/  hercepton completed 06-28-2018   Non-insulin dependent type 2 diabetes mellitus ([place])    followed by pcp   (10-05-2019 per pt check cbg every other day in AM,  fasting cbg-- 105)   Numbness of right thumb    Wears glasses     SURGICAL HISTORY: Past Surgical History:  Procedure Laterality Date   BREAST LUMPECTOMY WITH RADIOACTIVE SEED AND SENTINEL LYMPH NODE BIOPSY Left 06/09/2017   Procedure: BREAST LUMPECTOMY WITH RADIOACTIVE SEED AND SENTINEL LYMPH NODE BIOPSY;  Surgeon: Excell Seltzer, MD;  Location: [place];  Service: General;  Laterality: Left;   COLONOSCOPY  11/02/2017   polyps   HERNIA REPAIR     HIATAL HERNIA REPAIR  09/2019   IR RADIOLOGIST EVAL & MGMT  07/09/2021   IR RADIOLOGIST EVAL & MGMT  07/24/2021   IR RADIOLOGIST EVAL & MGMT  09/30/2021   LAPAROSCOPIC ASSISTED VAGINAL HYSTERECTOMY N/A 11/04/2017   Procedure: LAPAROSCOPIC ASSISTED VAGINAL HYSTERECTOMY;  Surgeon: [name], MD;  Location: [facility];  Service: Gynecology;  Laterality: N/A;   LAPAROSCOPIC BILATERAL SALPINGO OOPHERECTOMY Bilateral 11/04/2017   Procedure: LAPAROSCOPIC BILATERAL SALPINGO OOPHORECTOMY;  Surgeon: [name], MD;  Location: [facility];  Service: Gynecology;  Laterality: Bilateral;   LAPAROSCOPY N/A 10/09/2019   Procedure: LAPAROSCOPY DIAGNOSTIC WITH PERITONEAL BIOPSIES;  Surgeon: [name], MD;  Location:  [facility];  Service: Gynecology;  Laterality: N/A;   PORTACATH PLACEMENT Right 06/09/2017   Procedure: INSERTION PORT-A-CATH WITH [place];  Surgeon: Excell Seltzer, MD;  Location: [place];  Service: General;  Laterality: Right;   PORTACATH PLACEMENT Right 11/13/2019   Procedure: INSERTION PORT-A-CATH WITH ULTRASOUND GUIDANCE;  Surgeon: Rolm Bookbinder, MD;  Location: [place];  Service: General;  Laterality: Right;   RADIOLOGY WITH ANESTHESIA N/A 08/27/2021   Procedure: IR WITH ANESTHESIA MICROWAVE ABLATION OF LIVER;  Surgeon: Aletta Edouard, MD;  Location: [facility];  Service: Radiology;  Laterality: N/A;   TUBAL LIGATION  02/02/2002   @[facility]    [facility]   UPPER GI ENDOSCOPY      SOCIAL HISTORY: Social History   Socioeconomic History   Marital status: Legally Separated    Spouse name: Not on file   Number of children: 2   Years of education: Not  on file   Highest education level: Not on file  Occupational History   Not on file  Tobacco Use   Smoking status: Never   Smokeless tobacco: Never  Vaping Use   Vaping Use: Never used  Substance and Sexual Activity   Alcohol use: Not Currently    Comment: very rare   Drug use: Never   Sexual activity: Not Currently    Birth control/protection: Surgical  Other Topics Concern   Not on file  Social History Narrative   1 boy and 1 girl   Resides in [place]   Seperated   Social Determinants of Health   Financial Resource Strain: Not on file  Food Insecurity: Not on file  Transportation Needs: Not on file  Physical Activity: Not on file  Stress: Not on file  Social Connections: Not on file  Intimate Partner Violence: Not on file    FAMILY HISTORY: Family History  Problem Relation Age of Onset   Hypertension Mother    Lupus Mother    Diabetes Father    Hypertension Sister    Diabetes Paternal Grandmother    Colon cancer Paternal Grandmother 106   CAD Brother    Diabetes Brother     Heart attack Brother    Breast cancer Maternal Aunt        dx >50   Heart attack Paternal Grandfather    Breast cancer Maternal Aunt        dx under 15   Breast cancer Maternal Aunt        dx  under 50    ALLERGIES:  has No Known Allergies.  MEDICATIONS:  No current facility-administered medications for this visit.   Current Outpatient Medications  Medication Sig Dispense Refill   capecitabine (XELODA) 500 MG tablet Take 2 tablets (1,000 mg total) by mouth 2 (two) times daily after a meal. Take for 14 days on and 7 days off, repeat every 21 days. 56 tablet 3   tucatinib (TUKYSA) 150 MG tablet Take 2 tablets (300 mg total) by mouth 2 (two) times daily. Take every 12 hrs at the same time each day with or without a meal. 120 tablet 3   Facility-Administered Medications Ordered in Other Visits  Medication Dose Route Frequency Provider Last Rate Last Admin   acetaminophen (TYLENOL) tablet 650 mg  650 mg Oral Q6H PRN Lenore Cordia, MD   650 mg at 11/20/21 0406   Or   acetaminophen (TYLENOL) suppository 650 mg  650 mg Rectal Q6H PRN Lenore Cordia, MD       alum & mag hydroxide-simeth (MAALOX/MYLANTA) 200-200-20 MG/5ML suspension 30 mL  30 mL Oral Q6H PRN Starla Link, Kshitiz, MD   30 mL at 11/22/21 1344   bisacodyl (DULCOLAX) suppository 10 mg  10 mg Rectal Daily PRN Aline August, MD       capecitabine (XELODA) tablet 1,000 mg  1,000 mg Oral BID PC [name], MD   1,000 mg at 11/24/21 1020   Chlorhexidine Gluconate Cloth 2 % PADS 6 each  6 each Topical Daily Aline August, MD   6 each at 11/24/21 1023   clarithromycin (BIAXIN) tablet 500 mg  500 mg Oral BID Aline August, MD   500 mg at 11/24/21 1019   dexamethasone (DECADRON) injection 4 mg  4 mg Intravenous Q6H Shelda Pal, DO   4 mg at 11/24/21 1230   enoxaparin (LOVENOX) injection 40 mg  40 mg Subcutaneous Q24H Lenore Cordia, MD   40  mg at 11/23/21 2244   famotidine (PEPCID) tablet 20 mg  20 mg Oral BID Shelda Pal, DO   20 mg at 11/24/21 1019   gabapentin (NEURONTIN) capsule 100 mg  100 mg Oral TID Shelda Pal, DO   100 mg at 11/24/21 1023   heparin lock flush 100 unit/mL  500 Units Intracatheter Once PRN [name], MD       lidocaine-prilocaine (EMLA) cream   Topical PRN Shelda Pal, DO   Given at 11/18/21 2217   memantine (NAMENDA) tablet 10 mg  10 mg Oral BID Lenore Cordia, MD   10 mg at 11/24/21 1019   menthol-cetylpyridinium (CEPACOL) lozenge 3 mg  1 lozenge Oral QID PRN Aline August, MD       metroNIDAZOLE (FLAGYL) tablet 500 mg  500 mg Oral TID Aline August, MD   500 mg at 11/24/21 1019   Muscle Rub CREA   Topical QID PRN Aline August, MD   Given at 11/22/21 1344   ondansetron (ZOFRAN) tablet 4 mg  4 mg Oral Q6H PRN Lenore Cordia, MD       Or   ondansetron (ZOFRAN) injection 4 mg  4 mg Intravenous Q6H PRN Lenore Cordia, MD   4 mg at 11/24/21 1230   oxyCODONE (Oxy IR/ROXICODONE) immediate release tablet 5 mg  5 mg Oral Q4H PRN Lenore Cordia, MD   5 mg at 11/20/21 1648   pantoprazole (PROTONIX) EC tablet 40 mg  40 mg Oral Daily Lenore Cordia, MD   40 mg at 11/24/21 1019   phenol (CHLORASEPTIC) mouth spray 1 spray  1 spray Mouth/Throat PRN Shelda Pal, DO   1 spray at 11/16/21 1300   polyethylene glycol (MIRALAX / GLYCOLAX) packet 17 g  17 g Oral Daily PRN Aline August, MD   17 g at 11/19/21 0953   promethazine (PHENERGAN) suppository 25 mg  25 mg Rectal Q6H PRN Lenore Cordia, MD       senna-docusate (Senokot-S) tablet 1 tablet  1 tablet Oral BID Aline August, MD   1 tablet at 11/24/21 1019   sodium chloride flush (NS) 0.9 % injection 10 mL  10 mL Intracatheter PRN [name], MD       sodium chloride flush (NS) 0.9 % injection 3 mL  3 mL Intravenous Q12H Zada Finders R, MD   3 mL at 11/24/21 1025   tucatinib (TUKYSA) tablet 300 mg  300 mg Oral BID [name], MD   300 mg at 11/24/21 1020    REVIEW OF SYSTEMS:    Constitutional: Denies fevers, chills or abnormal night sweats Eyes: Denies blurriness of vision, double vision or watery eyes Ears, nose, mouth, throat, and face: Denies mucositis or sore throat Respiratory: Denies cough, dyspnea or wheezes Cardiovascular: Denies palpitation, chest discomfort or lower extremity swelling Gastrointestinal:  Denies nausea, heartburn or change in bowel habits Skin: Denies abnormal skin rashes Lymphatics: Denies new lymphadenopathy or easy bruising Neurological:Denies numbness, tingling or new weaknesses Behavioral/Psych: Mood is stable, no new changes  Breast: *** Denies any palpable lumps or discharge All other systems were reviewed with the patient and are negative.  PHYSICAL EXAMINATION: ECOG PERFORMANCE STATUS: {CHL ONC ECOG PS:[id number]}  There were no vitals filed for this visit. There were no vitals filed for this visit.  GENERAL:alert, no distress and comfortable SKIN: skin color, texture, turgor are normal, no rashes or significant lesions EYES: normal, conjunctiva are pink and non-injected, sclera clear  OROPHARYNX:no exudate, no erythema and lips, buccal mucosa, and tongue normal  NECK: supple, thyroid normal size, non-tender, without nodularity LYMPH:  no palpable lymphadenopathy in the cervical, axillary or inguinal LUNGS: clear to auscultation and percussion with normal breathing effort HEART: regular rate & rhythm and no murmurs and no lower extremity edema ABDOMEN:abdomen soft, non-tender and normal bowel sounds Musculoskeletal:no cyanosis of digits and no clubbing  PSYCH: alert & oriented x 3 with fluent speech NEURO: no focal motor/sensory deficits  LABORATORY DATA:  I have reviewed the data as listed Lab Results  Component Value Date   WBC 6.7 11/17/2021   HGB 11.2 (L) 11/17/2021   HCT 35.9 (L) 11/17/2021   MCV 104.4 (H) 11/17/2021   PLT 191 11/17/2021   Lab Results  Component Value Date   NA 135 11/17/2021   K 4.7  11/17/2021   CL 103 11/17/2021   CO2 24 11/17/2021    RADIOGRAPHIC STUDIES: I have personally reviewed the radiological reports and agreed with the findings in the report.  ASSESSMENT AND PLAN:  No problem-specific Assessment & Plan notes found for this encounter.   All questions were answered. The patient knows to call the clinic with any problems, questions or concerns.    [place], CMA 11/24/21  I Gardiner Coins, am acting as a Education administrator for Dr. [name] Oncology History  Malignant neoplasm of lower-outer quadrant of left breast of female, estrogen receptor negative ([place])  05/11/2017 Initial Diagnosis   Left breast biopsy 3:30 position 6 cm from nipple: IDC with DCIS, lymphovascular invasion present, grade 2-3, ER 0%, PR 0%, HER-2 positive ratio 2.28, Ki-67 20%, 1 cm lesion left breast, T1b N0 stage IA clinical stage   06/09/2017 Surgery   Left lumpectomy: IDC grade 3, 1.2 cm, DCIS is present, margins negative, 0/6 lymph nodes negative, ER 0%, PR 0%, HER-2 positive ratio 2.28, Ki-67 20%, T1 BN 0 stage IA   07/09/2017 - 09/29/2017 Adjuvant Chemotherapy   Taxol/Herceptin x 12 completed on 09/29/2017, went on to receive every 3 week adjuvant Herceptin through 06/28/2018   11/25/2017 - 01/07/2018 Radiation Therapy    The patient initially received a dose of 50.4 Gy in 28 fractions to the breast using whole-breast tangent fields. This was delivered using a 3-D conformal technique. The patient then received a boost to the seroma. This delivered an additional 10 Gy in 5 fractions using a 3-D technique. The total dose was 60.4 Gy.   02/15/2018 Genetic Testing   The Common Hereditary Cancer Panel offered by [facility] includes sequencing and/or deletion duplication testing of the following 47 genes: APC, ATM, AXIN2, BARD1, BMPR1A, BRCA1, BRCA2, BRIP1, CDH1, CDKN2A (p14ARF), CDKN2A (p16INK4a), CKD4, CHEK2, CTNNA1, DICER1, EPCAM (Deletion/duplication testing only), GREM1 (promoter region  deletion/duplication testing only), KIT, MEN1, MLH1, MSH2, MSH3, MSH6, MUTYH, NBN, NF1, NHTL1, PALB2, PDGFRA, PMS2, POLD1, POLE, PTEN, RAD50, RAD51C, RAD51D, SDHB, SDHC, SDHD, SMAD4, SMARCA4. STK11, TP53, TSC1, TSC2, and VHL.  The following genes were evaluated for sequence changes only: SDHA and HOXB13 c.251G>A variant only.   Results: POSITIVE. 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 The date of this test report is 02/15/2018   10/20/2019 PET scan   Bone mets involving Right hip, Bil Iliac bones, T8, T9, L2 and L3, Rt prox humerus, Righ glenoid. Some lytic and some sclerotic. Mild Left supraclav adenopathy, 2 small lesions basal ganglia (could be lacunar infarcts)   11/14/2019 - 12/26/2019 Chemotherapy   The patient had palonosetron (ALOXI) injection 0.25 mg, 0.25 mg,  Intravenous,  Once, 2 of 5 cycles Administration: 0.25 mg (12/05/2019), 0.25 mg (12/26/2019) ado-trastuzumab emtansine (KADCYLA) 260 mg in sodium chloride 0.9 % 250 mL chemo infusion, 3.6 mg/kg = 260 mg, Intravenous, Once, 3 of 6 cycles Administration: 260 mg (11/14/2019), 260 mg (12/05/2019), 260 mg (12/26/2019)   for chemotherapy treatment.     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  Required TPN briefly.  Patient was able to swallow and keep food down by the time of discharge.   02/06/2020 Miscellaneous   Talazoparib   07/15/2021 - 07/15/2021 Chemotherapy   Patient is on Treatment Plan : Intrathecal Methotrexate     08/14/2021 -  Chemotherapy   Patient is on Treatment Plan : BREAST Capecitabine + Trastuzumab + Tucatinib q21d     Fallopian tube cancer, carcinoma, right ([place])  11/04/2017 Surgery   Uterus, cervix and bilateral fallopian tubes: Fallopian tube with serous carcinoma 0.6 cm positive for p53, PAX8, WT-1, MOC-31, cytokeratin 7, and estrogen receptor    01/11/2018 -  Chemotherapy   Taxol and carboplatin every 3 weeks (Taxol discontinued with cycle 2 because of profound rash)   10/20/2019 PET scan   Bone mets involving Right hip, Bil Iliac bones, T8, T9, L2 and L3, Rt prox humerus, Righ glenoid. Some lytic and some sclerotic. Mild Left supraclav adenopathy, 2 small lesions basal ganglia (could be lacunar infarcts)   11/14/2019 - 12/26/2019 Chemotherapy   The patient had palonosetron (ALOXI) injection 0.25 mg, 0.25 mg, Intravenous,  Once, 2 of 5 cycles Administration: 0.25 mg (12/05/2019), 0.25 mg (12/26/2019) ado-trastuzumab emtansine (KADCYLA) 260 mg in sodium chloride 0.9 % 250 mL chemo infusion, 3.6 mg/kg = 260 mg, Intravenous, Once, 3 of 6 cycles Administration: 260 mg (11/14/2019), 260 mg (12/05/2019), 260 mg (12/26/2019)   for chemotherapy treatment.     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[place])   07/15/2021 - 07/15/2021 Chemotherapy   Patient is on Treatment Plan : Intrathecal Methotrexate     08/14/2021 -  Chemotherapy   Patient is on Treatment Plan : BREAST Capecitabine + Trastuzumab + Tucatinib q21d       REVIEW OF SYSTEMS:   Constitutional: Denies fevers, chills or abnormal weight loss Eyes: Denies blurriness of vision Ears, nose, mouth, throat, and face: Denies mucositis or sore throat Respiratory: Denies cough, dyspnea or wheezes Cardiovascular: Denies palpitation, chest discomfort Gastrointestinal:  Denies nausea, heartburn or change in bowel habits Skin: Denies abnormal skin rashes Lymphatics: Denies new lymphadenopathy or easy bruising Neurological:Denies numbness, tingling or new weaknesses Behavioral/Psych: Mood is stable, no new changes  Extremities: No lower extremity edema Breast: *** denies any pain or lumps or nodules in either breasts All other systems were  reviewed with the patient and are negative. Observations/Objective:     Assessment Plan:  No problem-specific Assessment & Plan notes found for this encounter.    I discussed the assessment and treatment plan with the patient. The patient was provided an opportunity to ask questions and all were answered. The patient agreed with the plan and demonstrated an understanding of the instructions. The patient was advised to call back or seek an in-person evaluation if the symptoms worsen or if the condition fails to improve as anticipated.   I provided ***  minutes of non-face-to-face time during this encounter. Kindra Bickham Bryson Ha, CMA  I, Gardiner Coins, am acting as a Education administrator for Dr. [name]

## 2021-11-24 NOTE — Progress Notes (Signed)
Inpatient Rehab Admissions Coordinator:  ? ? Meeting pt. And family to discuss potential CIR admit at 2pm today. ? ?Clemens Catholic, MS, CCC-SLP ?Rehab Admissions Coordinator  ?[phone] (celll) ?[phone] (office) ? ?

## 2021-11-24 NOTE — Progress Notes (Signed)
Inpatient Rehab Admissions Coordinator:   Per OT recommendation, patient was screened for CIR candidacy by Emanuell Morina, MS, CCC-SLP. At this time, Pt. Appears to be a a potential candidate for CIR. I will place   order for rehab consult per protocol for full assessment. Please contact me any with questions.  Anberlyn Feimster, MS, CCC-SLP Rehab Admissions Coordinator  [phone] (celll) [phone] (office)  

## 2021-11-25 ENCOUNTER — Other Ambulatory Visit (HOSPITAL_COMMUNITY): Payer: Self-pay

## 2021-11-25 ENCOUNTER — Other Ambulatory Visit: Payer: Self-pay

## 2021-11-25 ENCOUNTER — Inpatient Hospital Stay (HOSPITAL_COMMUNITY): Payer: Medicare Other

## 2021-11-25 ENCOUNTER — Encounter (HOSPITAL_COMMUNITY): Payer: Self-pay | Admitting: Physical Medicine and Rehabilitation

## 2021-11-25 ENCOUNTER — Inpatient Hospital Stay (HOSPITAL_COMMUNITY)
Admission: RE | Admit: 2021-11-25 | Discharge: 2021-11-27 | DRG: 945 | Disposition: A | Discharge status: Other Acute Inpatient Hospital | Payer: Medicare Other | Source: Other Acute Inpatient Hospital | Attending: Physical Medicine and Rehabilitation | Admitting: Physical Medicine and Rehabilitation

## 2021-11-25 DIAGNOSIS — B370 Candidal stomatitis: Secondary | ICD-10-CM | POA: Diagnosis present

## 2021-11-25 DIAGNOSIS — R5381 Other malaise: Principal | ICD-10-CM | POA: Diagnosis present

## 2021-11-25 DIAGNOSIS — Z171 Estrogen receptor negative status [ER-]: Secondary | ICD-10-CM

## 2021-11-25 DIAGNOSIS — C787 Secondary malignant neoplasm of liver and intrahepatic bile duct: Secondary | ICD-10-CM | POA: Diagnosis present

## 2021-11-25 DIAGNOSIS — M19022 Primary osteoarthritis, left elbow: Secondary | ICD-10-CM | POA: Diagnosis present

## 2021-11-25 DIAGNOSIS — I959 Hypotension, unspecified: Secondary | ICD-10-CM | POA: Diagnosis not present

## 2021-11-25 DIAGNOSIS — G8929 Other chronic pain: Secondary | ICD-10-CM | POA: Diagnosis present

## 2021-11-25 DIAGNOSIS — K2970 Gastritis, unspecified, without bleeding: Secondary | ICD-10-CM | POA: Diagnosis present

## 2021-11-25 DIAGNOSIS — H40001 Preglaucoma, unspecified, right eye: Secondary | ICD-10-CM | POA: Diagnosis present

## 2021-11-25 DIAGNOSIS — D72829 Elevated white blood cell count, unspecified: Secondary | ICD-10-CM | POA: Diagnosis not present

## 2021-11-25 DIAGNOSIS — Z8544 Personal history of malignant neoplasm of other female genital organs: Secondary | ICD-10-CM

## 2021-11-25 DIAGNOSIS — E785 Hyperlipidemia, unspecified: Secondary | ICD-10-CM | POA: Diagnosis present

## 2021-11-25 DIAGNOSIS — Z853 Personal history of malignant neoplasm of breast: Secondary | ICD-10-CM | POA: Diagnosis not present

## 2021-11-25 DIAGNOSIS — C50919 Malignant neoplasm of unspecified site of unspecified female breast: Principal | ICD-10-CM | POA: Diagnosis present

## 2021-11-25 DIAGNOSIS — D638 Anemia in other chronic diseases classified elsewhere: Secondary | ICD-10-CM | POA: Diagnosis present

## 2021-11-25 DIAGNOSIS — M549 Dorsalgia, unspecified: Secondary | ICD-10-CM | POA: Diagnosis present

## 2021-11-25 DIAGNOSIS — I10 Essential (primary) hypertension: Secondary | ICD-10-CM | POA: Diagnosis present

## 2021-11-25 DIAGNOSIS — G9340 Encephalopathy, unspecified: Secondary | ICD-10-CM | POA: Diagnosis not present

## 2021-11-25 DIAGNOSIS — H905 Unspecified sensorineural hearing loss: Secondary | ICD-10-CM | POA: Diagnosis present

## 2021-11-25 DIAGNOSIS — K208 Other esophagitis without bleeding: Secondary | ICD-10-CM | POA: Diagnosis present

## 2021-11-25 DIAGNOSIS — Z1501 Genetic susceptibility to malignant neoplasm of breast: Secondary | ICD-10-CM | POA: Diagnosis not present

## 2021-11-25 DIAGNOSIS — Z8249 Family history of ischemic heart disease and other diseases of the circulatory system: Secondary | ICD-10-CM

## 2021-11-25 DIAGNOSIS — E119 Type 2 diabetes mellitus without complications: Secondary | ICD-10-CM | POA: Diagnosis present

## 2021-11-25 DIAGNOSIS — E1165 Type 2 diabetes mellitus with hyperglycemia: Secondary | ICD-10-CM | POA: Diagnosis not present

## 2021-11-25 DIAGNOSIS — Y842 Radiological procedure and radiotherapy as the cause of abnormal reaction of the patient, or of later complication, without mention of misadventure at the time of the procedure: Secondary | ICD-10-CM | POA: Diagnosis present

## 2021-11-25 DIAGNOSIS — Z832 Family history of diseases of the blood and blood-forming organs and certain disorders involving the immune mechanism: Secondary | ICD-10-CM

## 2021-11-25 DIAGNOSIS — K219 Gastro-esophageal reflux disease without esophagitis: Secondary | ICD-10-CM | POA: Diagnosis present

## 2021-11-25 DIAGNOSIS — N179 Acute kidney failure, unspecified: Secondary | ICD-10-CM | POA: Diagnosis not present

## 2021-11-25 DIAGNOSIS — K5900 Constipation, unspecified: Secondary | ICD-10-CM | POA: Diagnosis present

## 2021-11-25 DIAGNOSIS — Z800 Family history of malignant neoplasm of digestive organs: Secondary | ICD-10-CM

## 2021-11-25 DIAGNOSIS — R29898 Other symptoms and signs involving the musculoskeletal system: Secondary | ICD-10-CM | POA: Diagnosis present

## 2021-11-25 DIAGNOSIS — C7951 Secondary malignant neoplasm of bone: Secondary | ICD-10-CM | POA: Diagnosis present

## 2021-11-25 DIAGNOSIS — E875 Hyperkalemia: Secondary | ICD-10-CM | POA: Diagnosis not present

## 2021-11-25 DIAGNOSIS — Z833 Family history of diabetes mellitus: Secondary | ICD-10-CM

## 2021-11-25 DIAGNOSIS — M19021 Primary osteoarthritis, right elbow: Secondary | ICD-10-CM | POA: Diagnosis present

## 2021-11-25 DIAGNOSIS — M170 Bilateral primary osteoarthritis of knee: Secondary | ICD-10-CM | POA: Diagnosis present

## 2021-11-25 DIAGNOSIS — R609 Edema, unspecified: Secondary | ICD-10-CM

## 2021-11-25 DIAGNOSIS — Z79899 Other long term (current) drug therapy: Secondary | ICD-10-CM

## 2021-11-25 DIAGNOSIS — R000 Tachycardia, unspecified: Secondary | ICD-10-CM | POA: Diagnosis not present

## 2021-11-25 DIAGNOSIS — Z803 Family history of malignant neoplasm of breast: Secondary | ICD-10-CM

## 2021-11-25 DIAGNOSIS — C7949 Secondary malignant neoplasm of other parts of nervous system: Secondary | ICD-10-CM | POA: Diagnosis present

## 2021-11-25 LAB — CBC
HCT: 39 % (ref 36.0–46.0)
Hemoglobin: 13.1 g/dL (ref 12.0–15.0)
MCH: 32.7 pg (ref 26.0–34.0)
MCHC: 33.6 g/dL (ref 30.0–36.0)
MCV: 97.3 fL (ref 80.0–100.0)
Platelets: 128 10*3/uL — ABNORMAL LOW (ref 150–400)
RBC: 4.01 MIL/uL (ref 3.87–5.11)
RDW: 15.8 % — ABNORMAL HIGH (ref 11.5–15.5)
WBC: 11.6 10*3/uL — ABNORMAL HIGH (ref 4.0–10.5)
nRBC: 0.3 % — ABNORMAL HIGH (ref 0.0–0.2)

## 2021-11-25 MED ORDER — GABAPENTIN 100 MG PO CAPS
100.0000 mg | ORAL_CAPSULE | Freq: Three times a day (TID) | ORAL | Status: DC
  Administered 2021-11-25 – 2021-11-27 (×6): 100 mg via ORAL
  Filled 2021-11-25 (×6): qty 1

## 2021-11-25 MED ORDER — ONDANSETRON HCL 4 MG/2ML IJ SOLN
4.0000 mg | Freq: Four times a day (QID) | INTRAMUSCULAR | Status: DC | PRN
Start: 2021-11-25 — End: 2021-11-25

## 2021-11-25 MED ORDER — SENNOSIDES-DOCUSATE SODIUM 8.6-50 MG PO TABS
1.0000 | ORAL_TABLET | Freq: Two times a day (BID) | ORAL | Status: DC
  Administered 2021-11-25 – 2021-11-27 (×4): 1 via ORAL
  Filled 2021-11-25 (×4): qty 1

## 2021-11-25 MED ORDER — POLYETHYLENE GLYCOL 3350 17 G PO PACK
17.0000 g | PACK | Freq: Every day | ORAL | Status: DC | PRN
Start: 2021-11-25 — End: 2021-11-27

## 2021-11-25 MED ORDER — PANTOPRAZOLE SODIUM 40 MG PO TBEC
40.0000 mg | DELAYED_RELEASE_TABLET | Freq: Every day | ORAL | Status: DC
  Administered 2021-11-26: 40 mg via ORAL
  Filled 2021-11-25: qty 1

## 2021-11-25 MED ORDER — SENNOSIDES-DOCUSATE SODIUM 8.6-50 MG PO TABS: 1.0000 | ORAL_TABLET | Freq: Two times a day (BID) | ORAL | Status: AC

## 2021-11-25 MED ORDER — CHLORHEXIDINE GLUCONATE CLOTH 2 % EX PADS
6.0000 | MEDICATED_PAD | Freq: Every day | CUTANEOUS | Status: DC
  Administered 2021-11-25 – 2021-11-27 (×3): 6 via TOPICAL

## 2021-11-25 MED ORDER — DEXAMETHASONE SODIUM PHOSPHATE 4 MG/ML IJ SOLN
4.0000 mg | Freq: Four times a day (QID) | INTRAMUSCULAR | Status: DC
  Filled 2021-11-25: qty 1

## 2021-11-25 MED ORDER — FLUCONAZOLE 100 MG PO TABS
200.0000 mg | ORAL_TABLET | Freq: Once | ORAL | Status: AC
  Administered 2021-11-25: 200 mg via ORAL
  Filled 2021-11-25: qty 2

## 2021-11-25 MED ORDER — TUCATINIB 150 MG PO TABS
300.0000 mg | ORAL_TABLET | Freq: Two times a day (BID) | ORAL | Status: DC
  Administered 2021-11-25 – 2021-11-27 (×4): 300 mg via ORAL
  Filled 2021-11-25 (×4): qty 2

## 2021-11-25 MED ORDER — ONDANSETRON HCL 4 MG/2ML IJ SOLN: 4.0000 mg | Freq: Four times a day (QID) | INTRAMUSCULAR | Status: DC | PRN

## 2021-11-25 MED ORDER — NALOXONE HCL 0.4 MG/ML IJ SOLN: 0.4000 mg | INTRAMUSCULAR | Status: DC | PRN

## 2021-11-25 MED ORDER — OXYCODONE HCL 5 MG PO TABS
5.0000 mg | ORAL_TABLET | ORAL | Status: DC | PRN
  Administered 2021-11-26: 5 mg via ORAL
  Filled 2021-11-25: qty 1

## 2021-11-25 MED ORDER — DEXAMETHASONE 4 MG PO TABS
4.0000 mg | ORAL_TABLET | Freq: Every day | ORAL | Status: DC
  Administered 2021-11-25 – 2021-11-27 (×3): 4 mg via ORAL
  Filled 2021-11-25 (×3): qty 1

## 2021-11-25 MED ORDER — DEXAMETHASONE 4 MG PO TABS: ORAL_TABLET | ORAL | Status: DC

## 2021-11-25 MED ORDER — POLYETHYLENE GLYCOL 3350 17 G PO PACK: 17.0000 g | PACK | Freq: Every day | ORAL | Status: AC | PRN

## 2021-11-25 MED ORDER — MUSCLE RUB 10-15 % EX CREA
TOPICAL_CREAM | Freq: Four times a day (QID) | CUTANEOUS | Status: DC | PRN
  Filled 2021-11-25: qty 85

## 2021-11-25 MED ORDER — SORBITOL 70 % SOLN
30.0000 mL | Freq: Once | Status: AC
  Administered 2021-11-25: 30 mL via ORAL
  Filled 2021-11-25: qty 30

## 2021-11-25 MED ORDER — SODIUM CHLORIDE 0.9% FLUSH: 10.0000 mL | INTRAVENOUS | Status: DC | PRN

## 2021-11-25 MED ORDER — METRONIDAZOLE 500 MG PO TABS
500.0000 mg | ORAL_TABLET | Freq: Three times a day (TID) | ORAL | Status: DC
  Administered 2021-11-25 – 2021-11-27 (×6): 500 mg via ORAL
  Filled 2021-11-25 (×8): qty 1

## 2021-11-25 MED ORDER — ACETAMINOPHEN 325 MG PO TABS: 650.0000 mg | ORAL_TABLET | Freq: Four times a day (QID) | ORAL | Status: DC | PRN

## 2021-11-25 MED ORDER — FAMOTIDINE 20 MG PO TABS
20.0000 mg | ORAL_TABLET | Freq: Two times a day (BID) | ORAL | Status: DC
  Administered 2021-11-25 – 2021-11-27 (×4): 20 mg via ORAL
  Filled 2021-11-25 (×4): qty 1

## 2021-11-25 MED ORDER — ENOXAPARIN SODIUM 40 MG/0.4ML IJ SOSY
40.0000 mg | PREFILLED_SYRINGE | INTRAMUSCULAR | Status: DC
  Administered 2021-11-25 – 2021-11-26 (×2): 40 mg via SUBCUTANEOUS
  Filled 2021-11-25 (×2): qty 0.4

## 2021-11-25 MED ORDER — BISACODYL 10 MG RE SUPP
10.0000 mg | Freq: Every day | RECTAL | Status: DC | PRN
Start: 2021-11-25 — End: 2021-11-27

## 2021-11-25 MED ORDER — ONDANSETRON HCL 4 MG PO TABS
4.0000 mg | ORAL_TABLET | Freq: Four times a day (QID) | ORAL | Status: DC | PRN
  Administered 2021-11-26 – 2021-11-27 (×3): 4 mg via ORAL
  Filled 2021-11-25 (×4): qty 1

## 2021-11-25 MED ORDER — CLARITHROMYCIN 500 MG PO TABS
500.0000 mg | ORAL_TABLET | Freq: Two times a day (BID) | ORAL | Status: DC
  Filled 2021-11-25: qty 1

## 2021-11-25 MED ORDER — SODIUM CHLORIDE 0.9% FLUSH
10.0000 mL | Freq: Two times a day (BID) | INTRAVENOUS | Status: DC
  Administered 2021-11-25 – 2021-11-27 (×4): 10 mL

## 2021-11-25 MED ORDER — TUCATINIB 150 MG PO TABS: 300.0000 mg | ORAL_TABLET | Freq: Two times a day (BID) | ORAL | Status: DC

## 2021-11-25 MED ORDER — DIPHENHYDRAMINE HCL 50 MG/ML IJ SOLN
12.5000 mg | Freq: Four times a day (QID) | INTRAMUSCULAR | Status: DC | PRN
Start: 2021-11-25 — End: 2021-11-25

## 2021-11-25 MED ORDER — SODIUM CHLORIDE 0.9% FLUSH: 9.0000 mL | INTRAVENOUS | Status: DC | PRN

## 2021-11-25 MED ORDER — ENOXAPARIN SODIUM 40 MG/0.4ML IJ SOSY: 40.0000 mg | PREFILLED_SYRINGE | INTRAMUSCULAR | Status: DC

## 2021-11-25 MED ORDER — ACETAMINOPHEN 650 MG RE SUPP: 650.0000 mg | Freq: Four times a day (QID) | RECTAL | Status: DC | PRN

## 2021-11-25 MED ORDER — HEPARIN SOD (PORK) LOCK FLUSH 100 UNIT/ML IV SOLN: 500.0000 [IU] | Freq: Once | INTRAVENOUS | Status: DC

## 2021-11-25 MED ORDER — HYDROMORPHONE 1 MG/ML IV SOLN
INTRAVENOUS | Status: DC
  Filled 2021-11-25: qty 30

## 2021-11-25 MED ORDER — GABAPENTIN 100 MG PO CAPS: 100.0000 mg | ORAL_CAPSULE | Freq: Three times a day (TID) | ORAL | Status: AC

## 2021-11-25 MED ORDER — FLUCONAZOLE 100 MG PO TABS
100.0000 mg | ORAL_TABLET | Freq: Every day | ORAL | Status: DC
  Administered 2021-11-26 – 2021-11-27 (×2): 100 mg via ORAL
  Filled 2021-11-25 (×2): qty 1

## 2021-11-25 MED ORDER — DIPHENHYDRAMINE HCL 12.5 MG/5ML PO ELIX: 12.5000 mg | ORAL_SOLUTION | Freq: Four times a day (QID) | ORAL | Status: DC | PRN

## 2021-11-25 MED ORDER — MEMANTINE HCL 10 MG PO TABS
10.0000 mg | ORAL_TABLET | Freq: Two times a day (BID) | ORAL | Status: DC
  Administered 2021-11-25 – 2021-11-27 (×4): 10 mg via ORAL
  Filled 2021-11-25 (×4): qty 1

## 2021-11-25 MED ORDER — CAPECITABINE 500 MG PO TABS
1000.0000 mg | ORAL_TABLET | Freq: Two times a day (BID) | ORAL | Status: DC
  Administered 2021-11-25 – 2021-11-27 (×4): 1000 mg via ORAL
  Filled 2021-11-25 (×5): qty 2

## 2021-11-25 NOTE — Progress Notes (Signed)
Bilateral lower extremity venous duplex completed. ?Refer to "CV Proc" under chart review to view preliminary results. ? ?11/25/2021 3:16 PM ?Kelby Aline., MHA, RVT, RDCS, RDMS   ?

## 2021-11-25 NOTE — PMR Pre-admission (Signed)
PMR Admission Coordinator Pre-Admission Assessment ? ?Patient: Erin [name] is an 61 y.o., female ?MRN: [id number] ?DOB: 09-08-1961 ?Height: _0  (154.9 cm) ?Weight: 54 kg ? ?Insurance Information ?HMO:     PPO:      PCP:      IPA:      80/20:      OTHER:  ?PRIMARY: Medicaid   of [place]    Policy#: [id number]      Subscriber: Pt  ?CM Name:       Phone#:      Fax#:  ?Pre-Cert#:       Employer:  ?Benefits:  Phone #:      Name:  ?Eff. Date: active 11/25/21     Deduct:       Out of Pocket Max:       Life Max:  ?CIR:       SNF:  ?Outpatient:      Co-Pay:  ?Home Health:       Co-Pay:  ?DME:      Co-Pay:  ?Providers:  ?SECONDARY: Medicare Part A [id number]      Policy#:      Phone#:  ? ?Financial Counselor:       Phone#:  ? ?The ?Data Collection Information Summary? for patients in Inpatient Rehabilitation Facilities with attached ?Privacy Act [place] Records? was provided and verbally reviewed with: Patient ? ?Emergency Contact Information ?Contact Information   ? ? Name Relation Home Work Mobile  ? [name],Erin Mother [phone]  [phone]  ? [name] Loron Sister   [phone]  ? [name],Erin Son   [phone]  ? ?  ? ? 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  Per chart review patient lives alone.  Her mother does assist on a daily basis.  1 level home 3 steps to entry.  Noted increased falls over the last 6 months as well as  progressive lower extremity weakness/numbness and using a walker.  By report she was having some difficulty urination and bowel movement but denied any loss of control of bowel or bladder.  Presented 11/14/2021 with progressive lower extremity weakness.  CT/MRI of the brain no evidence of acute infarct redemonstrated chronic small vessel infarct within the left corona radiata and basal ganglia.  Similar to recent prior MRI 10/26/2021.  MRI lumbar spine 11/15/2021 with abnormal enhancement of the distal spinal cord and cauda equina consistent with leptomeningeal metastatic disease, multifocal osseous metastatic disease, L3 pathologic pression fracture with bony retropulsion and mild to moderate spinal canal narrowing.  MRI thoracic spine 11/15/2021 abnormal enhancement of the distal spinal cord extending inferiorly from T9, multifocal osseous metastatic disease.  MRI cervical spine thigh focal severe spinal canal stenosis with spinal cord flattening at C4-5 and C5-6.  Neurosurgery Dr. Christella Noa initially consulted patient not felt to be a surgical candidate.  Neurology as well as oncology Dr.Gudena consulted with review of MRI and felt patient's symptoms of lower extremity progressive weakness related to leptomeningeal disease.  She was placed on trial of Decadron therapy as well as case to be discussed with medical oncology Dr. Mickeal Skinner.  Her oral chemotherapy regimen has been resumed and she was to bring her medications in from home.  Possible H. pylori gastritis restart on triple therapy as an outpatient and continue at this time.  She has been placed on Lovenox for DVT prophylaxis.  She is tolerating a regular diet.  Therapy evaluations completed due to patient decreased functional mobility progressive lower extremity weakness was admitted for a comprehensive rehab program. ?  ?  ? ?Patient's medical record from [facility] has been reviewed by the rehabilitation admission coordinator and physician. ? ?Past Medical  History  ?Past Medical History:  ?Diagnosis Date  ? Arthritis   ? knees, elbows  ? Borderline glaucoma of right eye   ? BRCA2 gene mutation positive 02/17/2018  ? BRCA2 c.4552del ([id number])  Result reported out on 02/15/2018.   ? Breast cancer ([place]) 05/2017  ? left  ? Chronic back pain   ? Deaf   ? per pt born hearing and at age 77 1/2 lost hearing , was told by mother unknown cause, can miminally hear in left and no hearing on right   ? Depression   ? Elevated cancer antigen 125 (CA 125)   ? Family history of breast cancer   ? Family history of breast cancer   ? Family history of colon cancer   ? Genetic testing 02/17/2018  ? The Common Hereditary Cancer Panel offered by [facility] includes sequencing and/or deletion duplication testing of the following 47 genes: APC, ATM, AXIN2, BARD1, BMPR1A, BRCA1, BRCA2, BRIP1, CDH1, CDKN2A (p14ARF), CDKN2A (p16INK4a), CKD4, CHEK2, CTNNA1, DICER1, EPCAM (Deletion/duplication testing only), GREM1 (promoter region deletion/duplication testing only), KIT, MEN1, MLH1, MSH2, MSH3, MSH6, MU  ? GERD (gastroesophageal reflux disease)   ? History of cancer chemotherapy   ? left breast cancer 09-08-2017 to 10-09-2017;  fallopion tube cancer  12-15-2017  to 06-28-2018  ? 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 History of external beam radiation therapy   ? left breast  11-25-2017  to 01-07-2018  ? Hyperlipidemia   ? Hypertension   ? followed by pcp   (10-05-2019  per pt never had stress test)  ? IDA (iron deficiency anemia)   ? Malignant neoplasm of lower-outer quadrant of left breast of female, estrogen receptor negative [facility]) oncologist-- dr [name]  ? 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 Non-insulin dependent type 2 diabetes mellitus ([place])   ? followed by pcp   (10-05-2019 per pt check cbg every other day in AM,  fasting cbg-- 105)  ? Numbness of right thumb   ? Wears glasses   ? ? ?Has the patient had major surgery during 100 days prior to admission? Yes ? ?Family History   ?family history includes Breast cancer in her maternal aunt, maternal aunt, and maternal aunt; CAD in her brother; Colon cancer (age of onset: 28) in her paternal grandmother; Diabetes in her brother, father, and paternal grandmother; Heart attack in her brother and paternal grandfather; Hypertension in her mother and sister; Lupus in her mother. ? ?Current Medications ? ?Current Facility-Administered Medications:  ?  acetaminophen (TYLENOL) tablet 650 mg, 650 mg, Oral,  Q6H PRN, 650 mg at 11/20/21 0406 **OR** acetaminophen (TYLENOL) suppository 650 mg, 650 mg, Rectal, Q6H PRN, Lenore Cordia, MD ?  alum & mag hydroxide-simeth (MAALOX/MYLANTA) 200-200-20 MG/5ML suspension 30 mL, 30 mL, Oral, Q6H PRN, Starla Link, Kshitiz, MD, 30 mL at 11/22/21 1344 ?  bisacodyl (DULCOLAX) suppository 10 mg, 10 mg, Rectal, Daily PRN, Starla Link, Kshitiz, MD ?  capecitabine (XELODA) tablet 1,000 mg, 1,000 mg, Oral, BID PC, [name], MD, 1,000 mg at 11/24/21 1020 ?  Chlorhexidine Gluconate Cloth 2 % PADS 6 each, 6 each, Topical, Daily, Aline August, MD, 6 each at 11/25/21 0942 ?  clarithromycin (BIAXIN) tablet 500 mg, 500 mg, Oral, BID, Starla Link, Kshitiz, MD, 500 mg at 11/25/21 0941 ?  dexamethasone (DECADRON) injection 4 mg, 4 mg, Intravenous, Q6H, Shelda Pal, DO, 4 mg at 11/25/21 0533 ?  enoxaparin (LOVENOX) injection 40 mg, 40 mg, Subcutaneous, Q24H, Patel, Vishal R, MD, 40 mg at 11/24/21 2106 ?  famotidine (PEPCID) tablet 20 mg, 20 mg, Oral, BID, Shelda Pal, DO, 20 mg at 11/25/21 0940 ?  gabapentin (NEURONTIN) capsule 100 mg, 100 mg, Oral, TID, Shelda Pal,  DO, 100 mg at 11/25/21 0940 ?  heparin lock flush 100 unit/mL, 500 Units, Intravenous, Once, Alekh, Kshitiz, MD ?  lidocaine-prilocaine (EMLA) cream, , Topical, PRN, Shelda Pal, DO, Given at [phone]

## 2021-11-25 NOTE — Progress Notes (Addendum)
Inpatient Rehabilitation Admission Medication Review by a Pharmacist ? ?A complete drug regimen review was completed for this patient to identify any potential clinically significant medication issues. ? ?High Risk Drug Classes Is patient taking? Indication by Medication  ?Antipsychotic No   ?Anticoagulant Yes Lovenox- VTE prophylaxis  ?Antibiotic Yes Biaxin- H. pylori  ?Opioid Yes OxyIR- pain  ?Antiplatelet No   ?Hypoglycemics/insulin No   ?Vasoactive Medication No   ?Chemotherapy Yes, Oral Chemotherapy Xeloda, Tucatinib- metastatic left breast CA w/ extensive spinal met's/leptomeningeal carcinomatosis/hepatic metastases  ?Other Yes Pepcid- GERD ?Gabapentin- neuropathic pain ?Protonix- GERD  ? ? ? ?Type of Medication Issue Identified Description of Issue Recommendation(s)  ?Drug Interaction(s) (clinically significant) ?    ?Duplicate Therapy ?    ?Allergy ?    ?No Medication Administration End Date ? Biaxin Discharge summary from [facility] states patient has completed 2 weeks of antibiotic therapy for H. Pylori and they can be discontinued. Recommend discontinuing biaxin  ?Incorrect Dose ?    ?Additional Drug Therapy Needed ?    ?Significant med changes from prior encounter (inform family/care partners about these prior to discharge).    ?Other ?    ? ? ?Clinically significant medication issues were identified that warrant physician communication and completion of prescribed/recommended actions by midnight of the next day:  Yes ? ?Name of provider notified for issue identified: Marlowe Shores PA ? ?Provider Method of Notification: Secure chat ? ?Dan responded with in a minute of sending secure chat stating it is okay to discontinue the Biaxin. All issues have been resolved.  ? ?Time spent performing this drug regimen review (minutes):  30 ? ? ?Lucresha Dismuke BS, PharmD, BCPS ?Clinical Pharmacist ?11/25/2021 12:46 PM ?

## 2021-11-25 NOTE — Discharge Instructions (Signed)
Inpatient Rehab Discharge Instructions ? ?[name] ?Discharge date and time: No discharge date for patient encounter.  ? ?Activities/Precautions/ Functional Status: ?Activity: activity as tolerated ?Diet: regular diet ?Wound Care: Routine skin checks ?Functional status:  ?___ No restrictions     ___ Walk up steps independently ?___ 24/7 supervision/assistance   ___ Walk up steps with assistance ?___ Intermittent supervision/assistance  ___ Bathe/dress independently ?___ Walk with walker     __x_ Bathe/dress with assistance ?___ Walk Independently    ___ Shower independently ?___ Walk with assistance    ___ Shower with assistance ?___ No alcohol     ___ Return to work/school ________ ? ?Special Instructions: ? ?No driving smoking or alcohol ? ?My questions have been answered and I understand these instructions. I will adhere to these goals and the provided educational materials after my discharge from the hospital. ? ?Patient/Caregiver Signature _______________________________ Date __________ ? ?Clinician Signature _______________________________________ Date __________ ? ?Please bring this form and your medication list with you to all your follow-up doctor's appointments.   ?

## 2021-11-25 NOTE — H&P (Signed)
? ? ?Physical Medicine and Rehabilitation Admission H&P ? ?  ?Chief Complaint  ?Patient presents with  ? Extremity Weakness  ? Leg Numbness  ?  bilateral  ? Fall  ?: ?HPI: Marcie C. 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  Per chart review patient lives alone.  Her mother does assist on a daily basis.  1 level home 3 steps to entry.  Noted increased falls over the last 6 months as well as progressive lower extremity weakness/numbness and using a walker.  By report she was having some difficulty urination and bowel movement but denied any loss of control of bowel or bladder.  Presented 11/14/2021 with progressive lower extremity weakness.  CT/MRI of the brain no evidence of acute infarct redemonstrated chronic small vessel infarct within the left corona radiata and basal ganglia.  Similar to recent prior MRI 10/26/2021.  MRI lumbar spine 11/15/2021 with abnormal enhancement of the distal spinal cord and cauda equina consistent with leptomeningeal metastatic disease, multifocal osseous metastatic disease, L3 pathologic pression fracture with bony retropulsion and mild to moderate spinal canal narrowing.  MRI thoracic spine 11/15/2021 abnormal enhancement of the distal spinal cord extending inferiorly from T9, multifocal osseous metastatic disease.  MRI cervical spine thigh focal severe spinal canal stenosis with spinal cord flattening at C4-5 and C5-6.  Neurosurgery Dr. Christella Noa initially consulted patient not felt to be a surgical candidate.  Neurology as well  as oncology Dr.Gudena consulted with review of MRI and felt patient's symptoms of lower extremity progressive weakness related to leptomeningeal disease.  She was placed on trial of Decadron therapy as well as case to be discussed with medical oncology Dr. Mickeal Skinner.  Her oral chemotherapy regimen has been resumed and she was to bring her medications in from home.  Possible H. pylori gastritis restart on triple therapy as an outpatient and continue at this time.  She has been placed on Lovenox for DVT prophylaxis.  She is tolerating a regular diet.  Therapy evaluations completed due to patient decreased functional mobility progressive lower extremity weakness was admitted for a comprehensive rehab program. ? ? ?Pt reports has dry slightly painful throat/mouth;  ?LBM 5 days ago- is hungry but also has some reflux Sx's.  ? ?Is weak in Legs b/L  ? ? ?Review of Systems  ?Unable to perform ROS: Other  ?Constitutional:  Positive for malaise/fatigue. Negative for chills and fever.  ?HENT:  Negative for congestion and nosebleeds.   ?     Is deaf ?Dry painful mouth  ?Eyes:  Negative for blurred vision and double vision.  ?Respiratory: Negative.    ?Cardiovascular:  Negative for chest pain and leg swelling.  ?Gastrointestinal:  Positive for constipation and heartburn. Negative for abdominal pain and nausea.  ?Genitourinary: Negative.   ?Musculoskeletal: Negative.   ?Skin: Negative.   ?Neurological:  Positive for sensory change and focal weakness. Negative for tremors and seizures.  ?Endo/Heme/Allergies: Negative.   ?Psychiatric/Behavioral: Negative.    ?All other systems reviewed and are negative. ?Past Medical History:  ?Diagnosis Date  ? Arthritis   ? knees, elbows  ? Borderline glaucoma of right eye   ? BRCA2 gene mutation  positive 02/17/2018  ? BRCA2 c.4552del ([id number])  Result reported out on 02/15/2018.   ? Breast cancer ([place]) 05/2017  ? left  ? Chronic back pain   ? Deaf   ? per pt born hearing and at age 20 1/2  lost hearing , was told by mother unknown cause, can miminally hear in left and no hearing on right   ? Depression   ? Elevated cancer antigen 125 (CA 125)   ? Family history of breast cancer   ? Family history of breast cancer   ? Family history of colon cancer   ? Genetic testing 02/17/2018  ? The Common Hereditary Cancer Panel offered by [facility] includes sequencing and/or deletion duplication testing of the following 47 genes: APC, ATM, AXIN2, BARD1, BMPR1A, BRCA1, BRCA2, BRIP1, CDH1, CDKN2A (p14ARF), CDKN2A (p16INK4a), CKD4, CHEK2, CTNNA1, DICER1, EPCAM (Deletion/duplication testing only), GREM1 (promoter region deletion/duplication testing only), KIT, MEN1, MLH1, MSH2, MSH3, MSH6, MU  ? GERD (gastroesophageal reflux disease)   ? History of cancer chemotherapy   ? left breast cancer 09-08-2017 to 10-09-2017;  fallopion tube cancer  12-15-2017  to 06-28-2018  ? 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 History of external beam radiation therapy   ? left breast  11-25-2017  to 01-07-2018  ? Hyperlipidemia   ? Hypertension   ? followed by pcp   (10-05-2019  per pt never had stress test)  ? IDA (iron deficiency anemia)   ? Malignant neoplasm of lower-outer quadrant of left breast of female, estrogen receptor negative [facility]) oncologist-- dr [name]  ? 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 Non-insulin dependent type 2 diabetes mellitus ([place])   ? followed by pcp   (10-05-2019 per pt check cbg every other day in AM,  fasting cbg-- 105)  ? Numbness of right thumb   ? Wears glasses   ? ?Past Surgical History:  ?Procedure Laterality Date  ? BREAST LUMPECTOMY WITH RADIOACTIVE SEED  AND SENTINEL LYMPH NODE BIOPSY Left 06/09/2017  ? Procedure: BREAST LUMPECTOMY WITH RADIOACTIVE SEED AND SENTINEL LYMPH NODE BIOPSY;  Surgeon: Excell Seltzer, MD;  Location: [place];  Service: General;  Laterality: Left;  ? COLONOSCOPY  11/02/2017  ? polyps  ? HERNIA REPAIR    ? HIATAL HERNIA REPAIR  09/2019  ? IR RADIOLOGIST EVAL & MGMT  07/09/2021  ? IR RADIOLOGIST EVAL & MGMT  07/24/2021  ? IR RADIOLOGIST EVAL & MGMT  09/30/2021  ? LAPAROSCOPIC ASSISTED VAGINAL HYSTERECTOMY N/A 11/04/2017  ? Procedure: LAPAROSCOPIC ASSISTED VAGINAL HYSTERECTOMY;  Surgeon: [name], MD;  Location: [facility];  Service: Gynecology;  Laterality: N/A;  ? LAPAROSCOPIC BILATERAL SALPINGO OOPHERECTOMY Bilateral 11/04/2017  ? Procedure: LAPAROSCOPIC BILATERAL SALPINGO OOPHORECTOMY;  Surgeon: [name], MD;  Location: [facility];  Service: Gynecology;  Laterality: Bilateral;  ? LAPAROSCOPY N/A 10/09/2019  ? Procedure: LAPAROSCOPY DIAGNOSTIC WITH PERITONEAL BIOPSIES;  Surgeon: [name], MD;  Location: [name];  Service: Gynecology;  Laterality: N/A;  ? PORTACATH PLACEMENT Right 06/09/2017  ? Procedure: INSERTION PORT-A-CATH WITH [place];  Surgeon: Excell Seltzer, MD;  Location: [place];  Service:  General;  Laterality: Right;  ? PORTACATH PLACEMENT Right 11/13/2019  ? Procedure: INSERTION PORT-A-CATH WITH ULTRASOUND GUIDANCE;  Surgeon: Rolm Bookbinder, MD;  Location: [place];  Service: General;  Laterality: Right;  ? RADIOLOGY WITH ANESTHESIA N/A 08/27/2021  ? Procedure: IR WITH ANESTHESIA MICROWAVE ABLATION OF LIVER;  Surgeon: Aletta Edouard, MD;  Location: [facility];  Service: Radiology;  Laterality: N/A;  ? TUBAL LIGATION  02/02/2002   @[facility]   ? PPTL  ? UPPER GI ENDOSCOPY    ? ?Family History  ?Problem Relation Age of Onset  ? Hypertension Mother   ? Lupus Mother   ? Diabetes Father   ? Hypertension Sister   ? Diabetes Paternal Grandmother   ? Colon cancer Paternal  Grandmother 26  ? CAD Brother   ? Diabetes Brother   ? Heart attack Brother   ? Breast cancer Maternal Aunt   ?     dx >50  ? Heart attack Paternal Grandfather   ? Breast cancer Maternal Aunt   ?     dx

## 2021-11-25 NOTE — H&P (Signed)
?  ?Physical Medicine and Rehabilitation Admission H&P ?  ?  ?    ?Chief Complaint  ?Patient presents with  ? Extremity Weakness  ? Leg Numbness  ?    bilateral  ? 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  Per chart review patient lives alone.  Her mother does assist on a daily basis.  1 level home 3 steps to entry.  Noted increased falls over the last 6 months as well as progressive lower extremity weakness/numbness and using a walker.  By report she was having some difficulty urination and bowel movement but denied any loss of control of bowel or bladder.  Presented 11/14/2021 with progressive lower extremity weakness.  CT/MRI of the brain no evidence of acute infarct redemonstrated chronic small vessel infarct within the left corona radiata and basal ganglia.  Similar to recent prior MRI 10/26/2021.  MRI lumbar spine 11/15/2021 with abnormal enhancement of the distal spinal cord and cauda equina consistent with leptomeningeal metastatic disease, multifocal osseous metastatic disease, L3 pathologic pression fracture with bony retropulsion and mild to moderate spinal canal narrowing.  MRI thoracic spine 11/15/2021 abnormal enhancement of the distal spinal cord extending inferiorly from T9, multifocal osseous metastatic disease.  MRI cervical spine thigh focal severe spinal canal stenosis with spinal cord flattening at C4-5 and C5-6.  Neurosurgery Dr. Christella Noa initially consulted patient not felt to be a surgical candidate.  Neurology  as well as oncology Dr.Gudena consulted with review of MRI and felt patient's symptoms of lower extremity progressive weakness related to leptomeningeal disease.  She was placed on trial of Decadron therapy as well as case to be discussed with medical oncology Dr. Mickeal Skinner.  Her oral chemotherapy regimen has been resumed and she was to bring her medications in from home.  Possible H. pylori gastritis restart on triple therapy as an outpatient and continue at this time.  She has been placed on Lovenox for DVT prophylaxis.  She is tolerating a regular diet.  Therapy evaluations completed due to patient decreased functional mobility progressive lower extremity weakness was admitted for a comprehensive rehab program. ?  ?  ?Pt reports has dry slightly painful throat/mouth;  ?LBM 5 days ago- is hungry but also has some reflux Sx's.  ?  ?Is weak in Legs b/L  ?  ?  ?Review of Systems  ?Unable to perform ROS: Other  ?Constitutional:  Positive for malaise/fatigue. Negative for chills and fever.  ?HENT:  Negative for congestion and nosebleeds.   ?     Is deaf ?Dry painful mouth  ?Eyes:  Negative for blurred vision and double vision.  ?Respiratory: Negative.    ?Cardiovascular:  Negative for chest pain and leg swelling.  ?Gastrointestinal:  Positive for constipation and heartburn. Negative for abdominal pain and nausea.  ?Genitourinary: Negative.   ?Musculoskeletal: Negative.   ?Skin: Negative.   ?Neurological:  Positive for sensory change and focal weakness. Negative for tremors and seizures.  ?Endo/Heme/Allergies: Negative.   ?Psychiatric/Behavioral: Negative.    ?All other systems reviewed and are negative. ?    ?Past Medical History:  ?Diagnosis Date  ? Arthritis    ?  knees, elbows  ? Borderline glaucoma of right eye    ? BRCA2 gene mutation positive 02/17/2018  ?  BRCA2 [id number] ([id number])  Result reported out on 02/15/2018.   ? Breast cancer () 05/2017  ?  left  ? Chronic back pain    ? Deaf    ?  per pt born  hearing and at age 74 1/2 lost hearing , was told by mother unknown cause, can miminally hear in left and no hearing on right   ? Depression    ? Elevated cancer antigen 125 (CA 125)    ? Family history of breast cancer    ? Family history of breast cancer    ? Family history of colon cancer    ? Genetic testing 02/17/2018  ?  The Common Hereditary Cancer Panel offered by [facility] includes sequencing and/or deletion duplication testing of the following 47 genes: APC, ATM, AXIN2, BARD1, BMPR1A, BRCA1, BRCA2, BRIP1, CDH1, CDKN2A (p14ARF), CDKN2A (p16INK4a), CKD4, CHEK2, CTNNA1, DICER1, EPCAM (Deletion/duplication testing only), GREM1 (promoter region deletion/duplication testing only), KIT, MEN1, MLH1, MSH2, MSH3, MSH6, MU  ? GERD (gastroesophageal reflux disease)    ? History of cancer chemotherapy    ?  left breast cancer 09-08-2017 to 10-09-2017;  fallopion tube cancer  12-15-2017  to 06-28-2018  ? 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 History of external beam radiation therapy    ?  left breast  11-25-2017  to 01-07-2018  ? Hyperlipidemia    ? Hypertension    ?  followed by pcp   (10-05-2019  per pt never had stress test)  ? IDA (iron deficiency anemia)    ? Malignant neoplasm of lower-outer quadrant of left breast of female, estrogen receptor negative [facility]) oncologist-- dr [name]  ?  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 Non-insulin dependent type 2 diabetes mellitus ([place])    ?  followed by pcp   (10-05-2019 per pt check cbg every other day in AM,  fasting cbg-- 105)  ? Numbness of right thumb    ? Wears glasses    ?  ?     ?Past Surgical History:  ?Procedure  Laterality Date  ? BREAST LUMPECTOMY WITH RADIOACTIVE SEED AND SENTINEL LYMPH NODE BIOPSY Left 06/09/2017  ?  Procedure: BREAST LUMPECTOMY WITH RADIOACTIVE SEED AND SENTINEL LYMPH NODE BIOPSY;  Surgeon: Excell Seltzer, MD;  Location: [facility];  Service: General;  Laterality: Left;  ? COLONOSCOPY   11/02/2017  ?  polyps  ? HERNIA REPAIR      ? HIATAL HERNIA REPAIR   09/2019  ? IR RADIOLOGIST EVAL & MGMT   07/09/2021  ? IR RADIOLOGIST EVAL & MGMT   07/24/2021  ? IR RADIOLOGIST EVAL & MGMT   09/30/2021  ? LAPAROSCOPIC ASSISTED VAGINAL HYSTERECTOMY N/A 11/04/2017  ?  Procedure: LAPAROSCOPIC ASSISTED VAGINAL HYSTERECTOMY;  Surgeon: [name], MD;  Location: [facility];  Service: Gynecology;  Laterality: N/A;  ? LAPAROSCOPIC BILATERAL SALPINGO OOPHERECTOMY Bilateral 11/04/2017  ?  Procedure: LAPAROSCOPIC BILATERAL SALPINGO OOPHORECTOMY;  Surgeon: [name], MD;  Location: [facility];  Service: Gynecology;  Laterality: Bilateral;  ? LAPAROSCOPY N/A 10/09/2019  ?  Procedure: LAPAROSCOPY DIAGNOSTIC WITH PERITONEAL BIOPSIES;  Surgeon: [name], MD;  Location[facility];  Service: Gynecology;  Laterality: N/A;  ? PORTACATH PLACEMENT Right 06/09/2017  ?  Procedure: INSERTION PORT-A-CATH WITH [place];  Surgeon: Excell Seltzer, MD;  Location: [facility];  Service: General;  Laterality: Right;  ? PORTACATH PLACEMENT Right 11/13/2019  ?  Procedure: INSERTION PORT-A-CATH WITH ULTRASOUND GUIDANCE;  Surgeon: Rolm Bookbinder, MD;  Location: [place];  Service: General;  Laterality: Right;  ? RADIOLOGY WITH ANESTHESIA N/A 08/27/2021  ?  Procedure: IR WITH ANESTHESIA MICROWAVE ABLATION OF LIVER;  Surgeon: Aletta Edouard, MD;  Location: [facility];  Service: Radiology;  Laterality: N/A;  ? TUBAL LIGATION   02/02/2002   _0   ?  PPTL  ? UPPER GI ENDOSCOPY      ?  ?     ?Family History  ?Problem Relation Age of Onset  ? Hypertension Mother    ? Lupus Mother    ? Diabetes  Father    ? Hypertension Sister    ? Diabetes Paternal Grandmother    ? Colon cancer Paternal Grandmother 11  ? CAD Brother    ? Diabetes Brother    ? Heart attack Brother    ? Breast cancer Maternal Aunt    ?

## 2021-11-25 NOTE — Progress Notes (Signed)
Arrived to the unit via EMS from [facility]. ?Alert and oriented to the unit. Patient provided with a communication board, Interpreter, and pictures. Patient responses by nodding or sign language. Patient able to demonstrate how to use call bell. No signs or indication of distress at this time. Admission/assessment complete. ?Mother brought in Chemo medications which were delivered to pharmacy. Fluids and belongings at bedside. Call bell within reach. ? ? ? ?Yehuda Mao, LPN ?

## 2021-11-25 NOTE — Discharge Summary (Signed)
Physician Discharge Summary  ?CARMAN AUXIER BPZ:[id number] DOB: [birth year] DOA: 11/14/2021 ? ?PCP: [name], NP ? ?Admit date: 11/14/2021 ?Discharge date: 11/25/2021 ? ?Admitted From: Home ?Disposition: CIR ? ?Recommendations for Outpatient Follow-up:  ?Follow up with CIR provider at earliest convenience ?Outpatient follow-up with oncology ?Recommend outpatient evaluation and follow-up by palliative care for goals of care discussion ?Follow up in ED if symptoms worsen or new appear ? ? ?Home Health: No ?Equipment/Devices: None ? ?Discharge Condition: Guarded ?CODE STATUS: Full ?Diet recommendation: Regular ? ?Brief/Interim Summary: ?61 year old female with history of deafness communicating through American sign language, metastatic left-sided breast cancer with extensive spinal metastasis, hepatic metastasis, leptomeningeal carcinomatosis presented with progressive lower extremity weakness and falls.  Neurology and oncology were consulted.  Neurosurgery was also consulted and patient is not a surgical candidate.  Oncology has continued patient's home oral chemotherapy regimen.  She will be discharged to CIR once bed is available. ? ?Discharge Diagnoses:  ? ?Progressive lower extremity weakness ?Metastatic left breast cancer with extensive spinal metastasis/leptomeningeal carcinomatosis/hepatic metastases ?-Neurology recommended oncology input and possible neurosurgery evaluation after oncology input.  I had communicated with Dr. Cabbell/neurosurgery on phone on 11/17/2021: He reviewed patient's chart and imaging and apparently patient is not a neurosurgical candidate. ?-Oncology following and patient has been on Decadron.  Oncology has resumed patient's oral chemotherapy regimen: Patient will be taking her home supply of these medications.   ?-Apparently, patient will not be accepted at SNF because of her current chemotherapy even though she can bring her own chemotherapy pills.   ?-PT is now recommending CIR  placement.  She will be discharged to CIR once bed is available. ?-Patient will benefit from palliative care consultation as an outpatient. ?-Continue pain management and bowel regimen ?  ?Possible H. pylori gastritis ?-Was recently started on triple therapy as an outpatient.  Has completed 2 weeks course.  DC antibiotics ?  ?Anemia of chronic disease ?Macrocytosis ?-Hemoglobin stable.  Monitor intermittently. ?  ?Mildly elevated LFTs ?-ALT/AST mildly elevated: Improving.  Monitor intermittently. ?  ?Hyponatremia ?-Improved. ? ?Discharge Instructions ? ?Discharge Instructions   ? ? Diet general   Complete by: As directed ?  ? Increase activity slowly   Complete by: As directed ?  ? ?  ? ?Allergies as of 11/25/2021   ?No Known Allergies ?  ? ?  ?Medication List  ?  ? ?STOP taking these medications   ? ?clarithromycin 500 MG tablet ?Commonly known as: BIAXIN ?  ?metroNIDAZOLE 500 MG tablet ?Commonly known as: FLAGYL ?  ? ?  ? ?TAKE these medications   ? ?Accu-Chek FastClix Lancets Misc ?Use as instructed. Inject into the skin twice daily ?  ?Accu-Chek Guide Control Liqd ?1 each by In Vitro route once as needed for up to 1 dose. ?  ?Accu-Chek Guide test strip ?Generic drug: glucose blood ?Use as instructed. Check blood glucose by fingerstick twice per day. ?  ?atorvastatin 40 MG tablet ?Commonly known as: LIPITOR ?TAKE 1 TABLET BY MOUTH EVERY DAY ?  ?capecitabine 500 MG tablet ?Commonly known as: XELODA ?Take 2 tablets (1,000 mg total) by mouth 2 (two) times daily after a meal. Take for 14 days on and 7 days off, repeat every 21 days. ?  ?CINNAMON PO ?Take 2,400 mg by mouth every evening. 1200 mg per cap ?  ?dexamethasone 4 MG tablet ?Commonly known as: DECADRON ?'4mg'$  daily for one week then '2mg'$  daily till she sees oncology ?  ?diclofenac Sodium 1 % Gel ?  Commonly known as: VOLTAREN ?Apply 1 application topically daily as needed (pain). ?  ?famotidine 40 MG tablet ?Commonly known as: Pepcid ?Take 1 tablet (40 mg total) by  mouth daily. ?  ?gabapentin 100 MG capsule ?Commonly known as: NEURONTIN ?Take 1 capsule (100 mg total) by mouth 3 (three) times daily. ?  ?lidocaine-prilocaine cream ?Commonly known as: EMLA ?Apply to affected area once ?What changed:  ?how much to take ?how to take this ?when to take this ?reasons to take this ?  ?loperamide 2 MG capsule ?Commonly known as: IMODIUM ?Take 2 mg by mouth as needed for diarrhea or loose stools. ?  ?memantine 10 MG tablet ?Commonly known as: Namenda ?Take 1 tablet (10 mg total) by mouth 2 (two) times daily. ?  ?Multivitamin Adult Chew ?Chew 2 each by mouth daily. ?  ?omeprazole 40 MG capsule ?Commonly known as: PRILOSEC ?Take 1 capsule (40 mg total) by mouth daily. ?  ?ondansetron 8 MG disintegrating tablet ?Commonly known as: ZOFRAN-ODT ?Dissolve 1 tablet (8 mg total) by mouth every 8 (eight) hours as needed for nausea or vomiting. ?  ?polyethylene glycol 17 g packet ?Commonly known as: MIRALAX / GLYCOLAX ?Take 17 g by mouth daily as needed for moderate constipation (Constipation). ?  ?promethazine 25 MG suppository ?Commonly known as: PHENERGAN ?Unwrap and insert 1 suppository rectally every 6 hours as needed for nausea or vomiting. ?  ?senna-docusate 8.6-50 MG tablet ?Commonly known as: Senokot-S ?Take 1 tablet by mouth 2 (two) times daily. ?  ?tucatinib 150 MG tablet ?Commonly known as: TUKYSA ?Take 2 tablets (300 mg total) by mouth 2 (two) times daily. Take every 12 hrs at the same time each day with or without a meal. ?  ? ?  ? ? ?No Known Allergies ? ?Consultations: ?Neurology/oncology.  Discussed with neurosurgery as well ? ? ?Procedures/Studies: ?CT Head Wo Contrast ? ?Result Date: 11/14/2021 ?CLINICAL DATA:  Neuro deficit, acute, stroke suspected Leg weakness and numbness for 2 weeks.  Falls. EXAM: CT HEAD WITHOUT CONTRAST TECHNIQUE: Contiguous axial images were obtained from the base of the skull through the vertex without intravenous contrast. RADIATION DOSE REDUCTION: This  exam was performed according to the departmental dose-optimization program which includes automated exposure control, adjustment of the mA and/or kV according to patient size and/or use of iterative reconstruction technique. COMPARISON:  Brain MRI 07/10/2021 FINDINGS: Brain: No acute intracranial hemorrhage. Again seen remote lacunar infarcts in the left basal ganglia and caudate. There is minimal vague low-density involving the superior aspect of both cerebellar hemispheres, leptomeningeal disease suspected in this region on prior brain MRI. No evidence of territorial ischemia. No subdural or extra-axial collection. There is no midline shift or evident mass effect. No hydrocephalus. Vascular: No hyperdense vessel or unexpected calcification. Skull: No fracture or focal lesion. Sinuses/Orbits: Paranasal sinuses and mastoid air cells are clear. The visualized orbits are unremarkable. Other: None. IMPRESSION: 1. No acute intracranial abnormality. 2. Remote lacunar infarcts in the left basal ganglia and caudate. 3. Minimal vague low-density involving the superior aspect of both cerebellar hemispheres, leptomeningeal disease suspected in this region on prior brain MRI. Direct comparison is difficult due to differences in modality. Electronically Signed   By: Keith Rake M.D.   On: 11/14/2021 17:20  ? ?MR Brain Wo Contrast (neuro protocol) ? ?Result Date: 11/14/2021 ?CLINICAL DATA:  Provided history: Neuro deficit, acute, stroke suspected. EXAM: MRI HEAD WITHOUT CONTRAST TECHNIQUE: Multiplanar, multiecho pulse sequences of the brain and surrounding structures were obtained without intravenous  contrast. COMPARISON:  Head CT 11/14/2021.  Brain MRI 10/26/2021. FINDINGS: Brain: Cerebral volume is normal. Redemonstrated chronic small-vessel infarct within the left corona radiata and basal ganglia. Similar to the recent prior brain MRI of 10/26/2021, there is T2 FLAIR hyperintense signal abnormality within the left greater  than right cerebellar hemispheres. Given the findings on the recent prior brain MRI, this may reflect edema related to leptomeningeal metastatic disease and/or gliosis. There is no acute infarct. No chroni

## 2021-11-25 NOTE — Progress Notes (Addendum)
[facility] picking up patient for transport. Report previously called to [place], Therapist, sports. Notified unit that patient will be arriving shortly. Port-a-cath accessed/in place- changed needle today. Home medication picked up from inpatient pharmacy and returned to mother. ?

## 2021-11-25 NOTE — Progress Notes (Addendum)
PMR Admission Coordinator Pre-Admission Assessment ?  ?Patient: Erin [name] is an 61 y.o., female ?MRN: [id number] ?DOB: [birth year] ?Height: _0  (154.9 cm) ?Weight: 54 kg ?  ?Insurance Information ?HMO:     PPO:      PCP:      IPA:      80/20:      OTHER:  ?PRIMARY:   Medicare Part A  Policy # [id number]   Subscriber: Pt  ?Pre-Cert#:       Employer:  ?Benefits:  Phone #:      Name:  ?Eff. Date: Parts effective  12/14/10 =Deduct: $1600      Out of Pocket Max:  None      Life Max: N/A  ?CIR: 100%      SNF: 100 days ?Outpatient: 80%     Co-Pay: 20% ?Home Health: 100%      Co-Pay: none ?DME: 80%     Co-Pay: 20% ?Providers: patient's choice ?Providers:  ?SECONDARY: Medicaid   of     Policy#: [id number] k ? ?  ?  ?Financial Counselor:       Phone#:  ?  ?The ?Data Collection Information Summary? for patients in Inpatient Rehabilitation Facilities with attached ?Privacy Act [place] Records? was provided and verbally reviewed with: Patient ?  ?Emergency Contact Information ?Contact Information   ?  ?  Name Relation Home Work Mobile  ?  Cohen,Slaydell Mother [phone]   [phone]  ?  Lewie Loron Sister     [phone]  ?  Beirne,Marqus Son     [phone]  ?  ?   ?  ?  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  Per chart review patient lives alone.  Her mother does assist on a daily basis.  1 level home 3 steps to entry.  Noted  increased falls over the last 6 months as well as progressive lower extremity weakness/numbness and using a walker.  By report she was having some difficulty urination and bowel movement but denied any loss of control of bowel or bladder.  Presented 11/14/2021 with progressive lower extremity weakness.  CT/MRI of the brain no evidence of acute infarct redemonstrated chronic small vessel infarct within the left corona radiata and basal ganglia.  Similar to recent prior MRI 10/26/2021.  MRI lumbar spine 11/15/2021 with abnormal enhancement of the distal spinal cord and cauda equina consistent with leptomeningeal metastatic disease, multifocal osseous metastatic disease, L3 pathologic pression fracture with bony retropulsion and mild to moderate spinal canal narrowing.  MRI thoracic spine 11/15/2021 abnormal enhancement of the distal spinal cord extending inferiorly from T9, multifocal osseous metastatic disease.  MRI cervical spine thigh focal severe spinal canal stenosis with spinal cord flattening at C4-5 and C5-6.  Neurosurgery Dr. Christella Noa initially consulted patient not felt to be a surgical candidate.  Neurology as well as oncology Dr.Gudena consulted with review of MRI and felt patient's symptoms of lower extremity progressive weakness related to leptomeningeal disease.  She was placed on trial of Decadron therapy as well as case to be discussed with medical oncology Dr. Mickeal Skinner.  Her  oral chemotherapy regimen has been resumed and she was to bring her medications in from home.  Possible H. pylori gastritis restart on triple therapy as an outpatient and continue at this time.  She has been placed on Lovenox for DVT prophylaxis.  She is tolerating a regular diet.  Therapy evaluations completed due to patient decreased functional mobility progressive lower extremity weakness was admitted for a comprehensive rehab program. ?  ?  ?Patient's medical record from [facility] has been reviewed by the rehabilitation  admission coordinator and physician. ?  ?Past Medical History  ?    ?Past Medical History:  ?Diagnosis Date  ? Arthritis    ?  knees, elbows  ? Borderline glaucoma of right eye    ? BRCA2 gene mutation positive 02/17/2018  ?  BRCA2 c.4552del ([id number])  Result reported out on 02/15/2018.   ? Breast cancer ([place]) 05/2017  ?  left  ? Chronic back pain    ? Deaf    ?  per pt born hearing and at age 45 1/2 lost hearing , was told by mother unknown cause, can miminally hear in left and no hearing on right   ? Depression    ? Elevated cancer antigen 125 (CA 125)    ? Family history of breast cancer    ? Family history of breast cancer    ? Family history of colon cancer    ? Genetic testing 02/17/2018  ?  The Common Hereditary Cancer Panel offered by [facility] includes sequencing and/or deletion duplication testing of the following 47 genes: APC, ATM, AXIN2, BARD1, BMPR1A, BRCA1, BRCA2, BRIP1, CDH1, CDKN2A (p14ARF), CDKN2A (p16INK4a), CKD4, CHEK2, CTNNA1, DICER1, EPCAM (Deletion/duplication testing only), GREM1 (promoter region deletion/duplication testing only), KIT, MEN1, MLH1, MSH2, MSH3, MSH6, MU  ? GERD (gastroesophageal reflux disease)    ? History of cancer chemotherapy    ?  left breast cancer 09-08-2017 to 10-09-2017;  fallopion tube cancer  12-15-2017  to 06-28-2018  ? 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 History of external beam radiation therapy    ?  left breast  11-25-2017  to 01-07-2018  ? Hyperlipidemia    ? Hypertension    ?  followed by pcp   (10-05-2019  per pt never had stress test)  ? IDA (iron deficiency anemia)    ? Malignant neoplasm of lower-outer quadrant of left breast of female, estrogen receptor negative [facility]) oncologist-- dr [name]  ?  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 Non-insulin dependent type 2 diabetes mellitus ([place])    ?  followed by pcp   (10-05-2019 per pt check cbg every other day in AM,  fasting cbg-- 105)  ? Numbness of right thumb    ? Wears glasses    ?  ?  ?Has the patient had major surgery during 100 days prior to admission? Yes ?  ?Family History   ?family history includes Breast cancer in her maternal aunt, maternal aunt, and maternal aunt; CAD in her brother; Colon cancer (age of onset: 3) in her paternal grandmother; Diabetes in her brother, father, and paternal grandmother; Heart  attack in her brother and paternal grandfather; Hypertension in her mother and sister; Lupus in her mother. ?  ?Current Medications ?  ?Current Facility-Administered Medications:  ?  acetaminophen (TYLENOL) tablet 650 mg, 650 mg, Oral, Q6H PRN, 650 mg at 11/20/21 0406 **OR** acetaminophen (TYLENOL) suppository 650 mg, 650 mg, Rectal, Q6H PRN, Lenore Cordia, MD ?  alum & mag hydroxide-simeth (MAALOX/MYLANTA) 200-200-20 MG/5ML suspension 30 mL, 30 mL, Oral, Q6H PRN, Starla Link, Kshitiz, MD, 30 mL at 11/22/21 1344 ?  bisacodyl (DULCOLAX) suppository 10 mg, 10 mg, Rectal, Daily PRN, Starla Link, Kshitiz, MD ?  capecitabine (XELODA) tablet 1,000 mg, 1,000 mg, Oral, BID PC, [name], MD, 1,000 mg at 11/24/21 1020 ?  Chlorhexidine Gluconate Cloth 2 % PADS 6 each, 6 each, Topical, Daily, Aline August, MD, 6 each at 11/25/21 0942 ?  clarithromycin (BIAXIN) tablet 500 mg, 500 mg, Oral, BID, Starla Link, Kshitiz, MD, 500 mg at 11/25/21 0941 ?  dexamethasone (DECADRON) injection 4 mg, 4 mg, Intravenous, Q6H, Shelda Pal, DO, 4 mg at 11/25/21 0533 ?  enoxaparin (LOVENOX) injection 40 mg, 40 mg, Subcutaneous, Q24H, Patel, Vishal R, MD, 40 mg at 11/24/21 2106 ?  famotidine (PEPCID) tablet 20 mg, 20 mg, Oral, BID, Shelda Pal, DO, 20 mg at 11/25/21  0940 ?  gabapentin (NEURONTIN) capsule 100 mg, 100 mg, Oral, TID, Shelda Pal, DO, 100 mg at 11/25/21 0940 ?  heparin lock flush 100 unit/mL, 500 Units, Intravenous, Once, Aline August, MD ?  l

## 2021-11-25 NOTE — Progress Notes (Signed)
Inpatient Rehab Admissions Coordinator:  ° °I have a CIR bed for this Pt. Today. RN may call report to [phone]. ° °Rosaleigh Brazzel, MS, CCC-SLP °Rehab Admissions Coordinator  °[phone] (celll) °[phone] (office) ° °

## 2021-11-26 ENCOUNTER — Encounter: Payer: Medicaid Other | Admitting: Dietician

## 2021-11-26 ENCOUNTER — Telehealth: Payer: Medicaid Other | Admitting: Hematology and Oncology

## 2021-11-26 ENCOUNTER — Other Ambulatory Visit: Payer: Medicaid Other

## 2021-11-26 ENCOUNTER — Ambulatory Visit: Payer: Medicaid Other

## 2021-11-26 LAB — CBC WITH DIFFERENTIAL/PLATELET
Abs Immature Granulocytes: 0.5 10*3/uL — ABNORMAL HIGH (ref 0.00–0.07)
Basophils Absolute: 0 10*3/uL (ref 0.0–0.1)
Basophils Relative: 0 %
Eosinophils Absolute: 0 10*3/uL (ref 0.0–0.5)
Eosinophils Relative: 0 %
HCT: 38.2 % (ref 36.0–46.0)
Hemoglobin: 12.9 g/dL (ref 12.0–15.0)
Immature Granulocytes: 4 %
Lymphocytes Relative: 4 %
Lymphs Abs: 0.5 10*3/uL — ABNORMAL LOW (ref 0.7–4.0)
MCH: 33.2 pg (ref 26.0–34.0)
MCHC: 33.8 g/dL (ref 30.0–36.0)
MCV: 98.2 fL (ref 80.0–100.0)
Monocytes Absolute: 0.8 10*3/uL (ref 0.1–1.0)
Monocytes Relative: 6 %
Neutro Abs: 10.9 10*3/uL — ABNORMAL HIGH (ref 1.7–7.7)
Neutrophils Relative %: 86 %
Platelets: 120 10*3/uL — ABNORMAL LOW (ref 150–400)
RBC: 3.89 MIL/uL (ref 3.87–5.11)
RDW: 16 % — ABNORMAL HIGH (ref 11.5–15.5)
WBC: 12.7 10*3/uL — ABNORMAL HIGH (ref 4.0–10.5)
nRBC: 0.2 % (ref 0.0–0.2)

## 2021-11-26 LAB — COMPREHENSIVE METABOLIC PANEL
ALT: 54 U/L — ABNORMAL HIGH (ref 0–44)
AST: 25 U/L (ref 15–41)
Albumin: 3.3 g/dL — ABNORMAL LOW (ref 3.5–5.0)
Alkaline Phosphatase: 87 U/L (ref 38–126)
Anion gap: 11 (ref 5–15)
BUN: 30 mg/dL — ABNORMAL HIGH (ref 6–20)
CO2: 20 mmol/L — ABNORMAL LOW (ref 22–32)
Calcium: 7.7 mg/dL — ABNORMAL LOW (ref 8.9–10.3)
Chloride: 99 mmol/L (ref 98–111)
Creatinine, Ser: 1 mg/dL (ref 0.44–1.00)
GFR, Estimated: >60 mL/min (ref >60)
Glucose, Bld: 398 mg/dL — ABNORMAL HIGH (ref 70–99)
Potassium: 5.1 mmol/L (ref 3.5–5.1)
Sodium: 130 mmol/L — ABNORMAL LOW (ref 135–145)
Total Bilirubin: 1 mg/dL (ref 0.3–1.2)
Total Protein: 6.2 g/dL — ABNORMAL LOW (ref 6.5–8.1)

## 2021-11-26 MED ORDER — SORBITOL 70 % SOLN
30.0000 mL | Freq: Once | Status: AC
  Administered 2021-11-26: 30 mL via ORAL
  Filled 2021-11-26: qty 30

## 2021-11-26 MED ORDER — CITALOPRAM HYDROBROMIDE 20 MG PO TABS
20.0000 mg | ORAL_TABLET | Freq: Every day | ORAL | Status: DC
Start: 2021-11-26 — End: 2021-11-27
  Administered 2021-11-26 – 2021-11-27 (×2): 20 mg via ORAL
  Filled 2021-11-26 (×2): qty 1

## 2021-11-26 MED ORDER — PANTOPRAZOLE SODIUM 40 MG PO TBEC
40.0000 mg | DELAYED_RELEASE_TABLET | Freq: Two times a day (BID) | ORAL | Status: DC
  Administered 2021-11-26 – 2021-11-27 (×2): 40 mg via ORAL
  Filled 2021-11-26 (×3): qty 1
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
## 2021-11-26 NOTE — Progress Notes (Signed)
Inpatient Rehabilitation  Patient information reviewed and entered into eRehab system by Samrat Hayward M. Vincie Linn, M.A., CCC/SLP, PPS Coordinator.  Information including medical coding, functional ability and quality indicators will be reviewed and updated through discharge.    

## 2021-11-26 NOTE — Plan of Care (Signed)
?  Problem: RH Balance ?Goal: LTG Patient will maintain dynamic sitting balance (PT) ?Description: LTG:  Patient will maintain dynamic sitting balance with assistance during mobility activities (PT) ?Flowsheets (Taken 11/26/2021 1255) ?LTG: Pt will maintain dynamic sitting balance during mobility activities with:: Supervision/Verbal cueing ?  ?Problem: Sit to Stand ?Goal: LTG:  Patient will perform sit to stand with assistance level (PT) ?Description: LTG:  Patient will perform sit to stand with assistance level (PT) ?Flowsheets (Taken 11/26/2021 1255) ?LTG: PT will perform sit to stand in preparation for functional mobility with assistance level: Minimal Assistance - Patient > 75% ?  ?Problem: RH Bed Mobility ?Goal: LTG Patient will perform bed mobility with assist (PT) ?Description: LTG: Patient will perform bed mobility with assistance, with/without cues (PT). ?Flowsheets (Taken 11/26/2021 1255) ?LTG: Pt will perform bed mobility with assistance level of: Supervision/Verbal cueing ?  ?Problem: RH Bed to Chair Transfers ?Goal: LTG Patient will perform bed/chair transfers w/assist (PT) ?Description: LTG: Patient will perform bed to chair transfers with assistance (PT). ?Flowsheets (Taken 11/26/2021 1255) ?LTG: Pt will perform Bed to Chair Transfers with assistance level: Minimal Assistance - Patient > 75% ?  ?Problem: RH Car Transfers ?Goal: LTG Patient will perform car transfers with assist (PT) ?Description: LTG: Patient will perform car transfers with assistance (PT). ?Flowsheets (Taken 11/26/2021 1255) ?LTG: Pt will perform car transfers with assist:: Minimal Assistance - Patient > 75% ?  ?Problem: RH Wheelchair Mobility ?Goal: LTG Patient will propel w/c in controlled environment (PT) ?Description: LTG: Patient will propel wheelchair in controlled environment, # of feet with assist (PT) ?Flowsheets (Taken 11/26/2021 1255) ?LTG: Pt will propel w/c in controlled environ  assist needed:: Independent with assistive  device ?LTG: Propel w/c distance in controlled environment: 150 ft ?Goal: LTG Patient will propel w/c in home environment (PT) ?Description: LTG: Patient will propel wheelchair in home environment, # of feet with assistance (PT). ?Flowsheets (Taken 11/26/2021 1255) ?LTG: Pt will propel w/c in home environ  assist needed:: Independent with assistive device ?LTG: Propel w/c distance in home environment: 75 ft ?  ?

## 2021-11-26 NOTE — Progress Notes (Signed)
?                                                       PROGRESS NOTE ? ? ?Subjective/Complaints: ? ?Pt c/o severe reflux and also using mouth sponges to help with thrush- it's a little better.  ?Pt also reports low mood and low energy and a lot of fatigue.  ?Awful reflux; thrush somewhat better- lips dry.  ? ?ROS: ? ?Pt denies SOB, (+) reflux/abd pain, CP, N/V/C/D, and vision changes ? ? ?Objective: ?  ?VAS [place] LOWER EXTREMITY VENOUS (DVT) ? ?Result Date: 11/25/2021 ? Lower Venous DVT Study Patient Name:  Erin [name]  Date of Exam:   11/25/2021 Medical Rec #: [id number]       Accession #:    [id number] Date of Birth: October 11, 1960      Patient Gender: F Patient Age:   61 years Exam Location:  [facility] Procedure:      VAS [place] LOWER EXTREMITY VENOUS (DVT) Referring Phys: Lauraine Rinne --------------------------------------------------------------------------------  Indications: Edema.  Limitations: Poor ultrasound/tissue interface. Comparison Study: No prior study Performing Technologist: Maudry Mayhew MHA, RDMS, RVT, RDCS  Examination Guidelines: A complete evaluation includes B-mode imaging, spectral Doppler, color Doppler, and power Doppler as needed of all accessible portions of each vessel. Bilateral testing is considered an integral part of a complete examination. Limited examinations for reoccurring indications may be performed as noted. The reflux portion of the exam is performed with the patient in reverse Trendelenburg.  +---------+---------------+---------+-----------+----------+--------------+ RIGHT    CompressibilityPhasicitySpontaneityPropertiesThrombus Aging +---------+---------------+---------+-----------+----------+--------------+ CFV      Full           Yes      Yes                                 +---------+---------------+---------+-----------+----------+--------------+ SFJ      Full                                                         +---------+---------------+---------+-----------+----------+--------------+ FV Prox  Full                                                        +---------+---------------+---------+-----------+----------+--------------+ FV Mid   Full                                                        +---------+---------------+---------+-----------+----------+--------------+ FV DistalFull                                                        +---------+---------------+---------+-----------+----------+--------------+ PFV  Full                                                        +---------+---------------+---------+-----------+----------+--------------+ POP      Full           Yes      Yes                                 +---------+---------------+---------+-----------+----------+--------------+ PTV      Full                                                        +---------+---------------+---------+-----------+----------+--------------+ PERO     Full                                                        +---------+---------------+---------+-----------+----------+--------------+   +---------+---------------+---------+-----------+----------+--------------+ LEFT     CompressibilityPhasicitySpontaneityPropertiesThrombus Aging +---------+---------------+---------+-----------+----------+--------------+ CFV      Full           Yes      Yes                                 +---------+---------------+---------+-----------+----------+--------------+ SFJ      Full                                                        +---------+---------------+---------+-----------+----------+--------------+ FV Prox  Full                                                        +---------+---------------+---------+-----------+----------+--------------+ FV Mid   Full                                                         +---------+---------------+---------+-----------+----------+--------------+ FV DistalFull                                                        +---------+---------------+---------+-----------+----------+--------------+ PFV      Full                                                        +---------+---------------+---------+-----------+----------+--------------+  POP      Full           Yes      Yes                                 +---------+---------------+---------+-----------+----------+--------------+ PTV      Full                                                        +---------+---------------+---------+-----------+----------+--------------+ PERO     Full                                                        +---------+---------------+---------+-----------+----------+--------------+     Summary: RIGHT: - There is no evidence of deep vein thrombosis in the lower extremity.  - No cystic structure found in the popliteal fossa.  LEFT: - There is no evidence of deep vein thrombosis in the lower extremity.  - No cystic structure found in the popliteal fossa.  *See table(s) above for measurements and observations. Electronically signed by Servando Snare MD on 11/25/2021 at 8:32:00 PM.    Final    ?Recent Labs  ?  11/25/21 ?1400 11/26/21 ?0435  ?WBC 11.6* 12.7*  ?HGB 13.1 12.9  ?HCT 39.0 38.2  ?PLT 128* 120*  ? ?Recent Labs  ?  11/26/21 ?0435  ?NA 130*  ?K 5.1  ?CL 99  ?CO2 20*  ?GLUCOSE 398*  ?BUN 30*  ?CREATININE 1.00  ?CALCIUM 7.7*  ? ? ?Intake/Output Summary (Last 24 hours) at 11/26/2021 0833 ?Last data filed at 11/25/2021 1854 ?Gross per 24 hour  ?Intake 40 ml  ?Output --  ?Net 40 ml  ?  ? ?  ? ?Physical Exam: ?Vital Signs ?Blood pressure 127/82, pulse (!) 111, temperature 98 ?F (36.7 ?C), temperature source Oral, resp. rate 18, height '5\' 1"'$  (1.549 m), weight 52.4 kg, last menstrual period 07/09/2017, SpO2 99 %. ? ? ? ?General: awake, alert, appropriate, deaf; signing well; laying  in bed; eating breakfast; NAD ?HENT: conjugate gaze; oropharynx moist ?CV: tachycardic rate- regular rhythm; no JVD ?Pulmonary: CTA B/L; no W/R/R- good air movement ?GI: soft, NT, ND, (+)BS ?Psychiatric: appropriate ?Neurological: Ox3 ?Musculoskeletal:  ?   Cervical back: Neck supple. No tenderness.  ?   Comments: UE strength 5-/5 in Biceps, triceps, grip and finger abd B/L  ?Les- Proximal HF/KE/KF 2-/5; but DF/PF 4-/5 B/L - worse weakness on RLE  ?Skin: ?   General: Skin is warm and dry.  ?   Comments: Small skin tear on coccyx- pink- superficial 1x 0.5 cm  ?Neurological:  ?   Mental Status: She is oriented to person, place, and time.  ?   Comments: Patient is alert sitting up in bed.  Makes eye contact with examiner.  She is deaf and will read lips as well as use sign language. ?Decreased to light touch in LE's B/L from L1-S5  ? ? ?Assessment/Plan: ?1. Functional deficits which require 3+ hours per day of interdisciplinary therapy in a comprehensive inpatient rehab setting. ?Physiatrist is providing close team supervision and 24 hour management of active medical  problems listed below. ?Physiatrist and rehab team continue to assess barriers to discharge/monitor patient progress toward functional and medical goals ? ?Care Tool: ? ?Bathing ?   ?   ?   ?  ?  ?Bathing assist   ?  ?  ?Upper Body Dressing/Undressing ?Upper body dressing   ?  ?   ?Upper body assist   ?   ?Lower Body Dressing/Undressing ?Lower body dressing ? ? ?   ?  ? ?  ? ?Lower body assist   ?   ? ?Toileting ?Toileting    ?Toileting assist Assist for toileting: Moderate Assistance - Patient 50 - 74% ?  ?  ?Transfers ?Chair/bed transfer ? ?Transfers assist ?   ? ?  ?  ?  ?Locomotion ?Ambulation ? ? ?Ambulation assist ? ?   ? ?  ?  ?   ? ?Walk 10 feet activity ? ? ?Assist ?   ? ?  ?   ? ?Walk 50 feet activity ? ? ?Assist   ? ?  ?   ? ? ?Walk 150 feet activity ? ? ?Assist   ? ?  ?  ?  ? ?Walk 10 feet on uneven surface  ?activity ? ? ?Assist   ? ? ?  ?    ? ?Wheelchair ? ? ? ? ?Assist   ?  ?  ? ?  ?   ? ? ?Wheelchair 50 feet with 2 turns activity ? ? ? ?Assist ? ?  ?  ? ? ?   ? ?Wheelchair 150 feet activity  ? ? ? ?Assist ?   ? ? ?   ? ?Blood pressure 127/82, pulse (!) 111, temperature 98 ?F (36.7 ?C), temperature source Oral, resp. rate 18, height '5\' 1"'$  (1.549 m), weight 52.4 kg, last menstrual period 07/09/2017, SpO2 99 %. ? ?Medical Problem List and Plan: ?1.

## 2021-11-26 NOTE — Progress Notes (Signed)
Physical Therapy Note ? ?Patient Details  ?Name: Erin [name] ?MRN: [id number] ?Date of Birth: 02-28-1961 ?Today's Date: 11/26/2021 ? ? ? ?Pt's plan of care adjusted to 15/7 after speaking with care team and discussed with MD as pt currently unable to tolerate current therapy schedule with OT, PT, and SLP.    ? ? ?Excell Seltzer, PT, DPT, CSRS ?11/26/2021, 12:58 PM  ?

## 2021-11-26 NOTE — Evaluation (Signed)
Physical Therapy Assessment and Plan ? ?Patient Details  ?Name: Erin [name] ?MRN: [id number] ?Date of Birth: 01/30/1961 ? ?PT Diagnosis: Abnormal posture, Difficulty walking, Impaired sensation, and Muscle weakness ?Rehab Potential: Poor ?ELOS: 18-21 days  ? ?Today's Date: 11/26/2021 ?PT Individual Time: 1110-1140 ?PT Individual Time Calculation (min): 30 min   ?PT Amount of Missed Time (min): 30 Minutes ?PT Missed Treatment Reason: Patient fatigue ? ?Hospital Problem: Principal Problem: ?  Metastatic breast cancer ([place]) ?Active Problems: ?  Lower extremity weakness ? ? ?Past Medical History:  ?Past Medical History:  ?Diagnosis Date  ? Arthritis   ? knees, elbows  ? Borderline glaucoma of right eye   ? BRCA2 gene mutation positive 02/17/2018  ? BRCA2 c.[id number] ([id number])  Result reported out on 02/15/2018.   ? Breast cancer ([place]) 05/2017  ? left  ? Chronic back pain   ? Deaf   ? per pt born hearing and at age 83 1/2 lost hearing , was told by mother unknown cause, can miminally hear in left and no hearing on right   ? Depression   ? Elevated cancer antigen 125 (CA 125)   ? Family history of breast cancer   ? Family history of breast cancer   ? Family history of colon cancer   ? Genetic testing 02/17/2018  ? The Common Hereditary Cancer Panel offered by [facility] includes sequencing and/or deletion duplication testing of the following 47 genes: APC, ATM, AXIN2, BARD1, BMPR1A, BRCA1, BRCA2, BRIP1, CDH1, CDKN2A (p14ARF), CDKN2A (p16INK4a), CKD4, CHEK2, CTNNA1, DICER1, EPCAM (Deletion/duplication testing only), GREM1 (promoter region deletion/duplication testing only), KIT, MEN1, MLH1, MSH2, MSH3, MSH6, MU  ? GERD (gastroesophageal reflux disease)   ? History of cancer chemotherapy   ? left breast cancer 09-08-2017 to 10-09-2017;  fallopion tube cancer  12-15-2017  to 06-28-2018  ? 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 History of external beam radiation therapy   ? left breast  11-25-2017  to 01-07-2018  ? Hyperlipidemia   ? Hypertension   ? followed by pcp   (10-05-2019  per pt never had stress test)  ? IDA (iron deficiency anemia)   ? Malignant neoplasm of lower-outer quadrant of left breast of female, estrogen receptor negative [facility]) oncologist-- dr [name]  ? 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 Non-insulin dependent type 2 diabetes mellitus ([place])   ? followed by pcp   (10-05-2019 per pt check cbg every other day in AM,  fasting cbg-- 105)  ? Numbness of right thumb   ? Wears glasses   ? ?Past Surgical History:  ?Past Surgical History:  ?Procedure Laterality Date  ? BREAST LUMPECTOMY WITH RADIOACTIVE SEED AND SENTINEL LYMPH NODE BIOPSY Left 06/09/2017  ? Procedure: BREAST LUMPECTOMY WITH RADIOACTIVE SEED AND SENTINEL LYMPH NODE BIOPSY;  Surgeon: Excell Seltzer, MD;  Location: [place];  Service: General;  Laterality: Left;  ? COLONOSCOPY  11/02/2017  ? polyps  ? HERNIA REPAIR    ? HIATAL HERNIA REPAIR  09/2019  ? IR RADIOLOGIST EVAL & MGMT  07/09/2021  ? IR RADIOLOGIST EVAL & MGMT  07/24/2021  ? IR RADIOLOGIST  EVAL & MGMT  09/30/2021  ? LAPAROSCOPIC ASSISTED VAGINAL HYSTERECTOMY N/A 11/04/2017  ? Procedure: LAPAROSCOPIC ASSISTED VAGINAL HYSTERECTOMY;  Surgeon: [name], MD;  Location: [facility];  Service: Gynecology;  Laterality: N/A;  ? LAPAROSCOPIC BILATERAL SALPINGO OOPHERECTOMY Bilateral 11/04/2017  ? Procedure: LAPAROSCOPIC BILATERAL SALPINGO OOPHORECTOMY;  Surgeon: [name], MD;  Location: [facility];  Service: Gynecology;  Laterality: Bilateral;  ? LAPAROSCOPY N/A 10/09/2019  ? Procedure: LAPAROSCOPY DIAGNOSTIC WITH PERITONEAL BIOPSIES;   Surgeon: [name], MD;  Location: [facility];  Service: Gynecology;  Laterality: N/A;  ? PORTACATH PLACEMENT Right 06/09/2017  ? Procedure: INSERTION PORT-A-CATH WITH [place];  Surgeon: Excell Seltzer, MD;  Location: [place];  Service: General;  Laterality: Right;  ? PORTACATH PLACEMENT Right 11/13/2019  ? Procedure: INSERTION PORT-A-CATH WITH ULTRASOUND GUIDANCE;  Surgeon: Rolm Bookbinder, MD;  Location: [place];  Service: General;  Laterality: Right;  ? RADIOLOGY WITH ANESTHESIA N/A 08/27/2021  ? Procedure: IR WITH ANESTHESIA MICROWAVE ABLATION OF LIVER;  Surgeon: Aletta Edouard, MD;  Location: [facility];  Service: Radiology;  Laterality: N/A;  ? TUBAL LIGATION  02/02/2002   _0   ? PPTL  ? UPPER GI ENDOSCOPY    ? ? 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  Per chart review patient lives alone.  Her mother does assist on a daily basis.  1 level home 3 steps to entry.  Noted increased falls over the last 6 months as well as progressive lower extremity weakness/numbness and using a walker.  By report she was having some difficulty urination and bowel movement but denied any loss of control of bowel or bladder.  Presented 11/14/2021 with progressive lower extremity weakness.  CT/MRI of the brain no evidence of acute infarct redemonstrated chronic small vessel infarct within the left corona radiata and basal ganglia.  Similar to recent prior MRI 10/26/2021.   MRI lumbar spine 11/15/2021 with abnormal enhancement of the distal spinal cord and cauda equina consistent with leptomeningeal metastatic disease, multifocal osseous metastatic disease, L3 pathologic pression fracture with bony retropulsion and mild to moderate spinal canal narrowing.  MRI thoracic spine 11/15/2021 abnormal enhancement of the distal spinal cord extending inferiorly from T9, multifocal osseous metastatic disease.  MRI cervical spine thigh focal severe spinal canal stenosis with spinal cord flattening at C4-5 and C5-6.  Neurosurgery Dr. Christella Noa initially consulted patient not felt to be a surgical candidate.  Neurology as well as oncology Dr.Gudena consulted with review of MRI and felt patient's symptoms of lower extremity progressive weakness related to leptomeningeal disease.  She was placed on trial of Decadron therapy as well as case to be discussed with medical oncology Dr. Mickeal Skinner.  Her oral chemotherapy regimen has been resumed and she was to bring her medications in from home.  Possible H. pylori gastritis restart on triple therapy as an outpatient and continue at this time.  She has been placed on Lovenox for DVT prophylaxis.  She is tolerating a regular diet.  Therapy evaluations completed due to patient decreased functional mobility progressive lower extremity weakness was admitted for a comprehensive  rehab program. Patient transferred to CIR on 11/25/2021 .  ? ?Patient currently requires total with mobility secondary to muscle weakness, decreased cardiorespiratoy endurance, and decreased sitting balance, decreased postural control, and decreased balance strategies.  Prior to hospitalization, patient was min with mobility and lived with Alone in a [place] home.  Home access is 3 flights of steps.Level entry. ? ?Patient will benefit from skilled PT intervention to maximize safe functional mobility, minimize fall risk, and decrease caregiver burden for planned discharge home with 24 hour  assist.  Anticipate patient will  benefit from admission to SNF, otherwise will require HHPT  at discharge. ? ?PT - End of Session ?Activity Tolerance: Tolerates < 10 min activity, no significant change in vital

## 2021-11-26 NOTE — Evaluation (Signed)
Occupational Therapy Assessment and Plan ? ?Patient Details  ?Name: Erin [name] ?MRN: [id number] ?Date of Birth: Oct 11, 1960 ? ?OT Diagnosis: abnormal posture, acute pain, muscular wasting and disuse atrophy, muscle weakness (generalized), and pain in joint ?Rehab Potential: Rehab Potential (ACUTE ONLY): Fair ?ELOS: 2.5-3 weeks  ? ?Today's Date: 11/26/2021 ?OT Individual Time: 4166-0630 ?OT Individual Time Calculation (min): 90 min    ? ?Hospital Problem: Principal Problem: ?  Metastatic breast cancer ([facility]) ?Active Problems: ?  Lower extremity weakness ? ? ?Past Medical History:  ?Past Medical History:  ?Diagnosis Date  ? Arthritis   ? knees, elbows  ? Borderline glaucoma of right eye   ? BRCA2 gene mutation positive 02/17/2018  ? BRCA2 [id number] ([id number])  Result reported out on 02/15/2018.   ? Breast cancer ([facility]) 05/2017  ? left  ? Chronic back pain   ? Deaf   ? per pt born hearing and at age 8 1/2 lost hearing , was told by mother unknown cause, can miminally hear in left and no hearing on right   ? Depression   ? Elevated cancer antigen 125 (CA 125)   ? Family history of breast cancer   ? Family history of breast cancer   ? Family history of colon cancer   ? Genetic testing 02/17/2018  ? The Common Hereditary Cancer Panel offered by [facility] includes sequencing and/or deletion duplication testing of the following 47 genes: APC, ATM, AXIN2, BARD1, BMPR1A, BRCA1, BRCA2, BRIP1, CDH1, CDKN2A (p14ARF), CDKN2A (p16INK4a), CKD4, CHEK2, CTNNA1, DICER1, EPCAM (Deletion/duplication testing only), GREM1 (promoter region deletion/duplication testing only), KIT, MEN1, MLH1, MSH2, MSH3, MSH6, MU  ? GERD (gastroesophageal reflux disease)   ? History of cancer chemotherapy   ? left breast cancer 09-08-2017 to 10-09-2017;  fallopion tube cancer  12-15-2017  to 06-28-2018  ? 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 History of external beam radiation therapy   ? left breast  11-25-2017  to 01-07-2018  ? Hyperlipidemia   ? Hypertension   ? followed by pcp   (10-05-2019  per pt never had stress test)  ? IDA (iron deficiency anemia)   ? Malignant neoplasm of lower-outer quadrant of left breast of female, estrogen receptor negative [facility]) oncologist-- dr [name]  ? 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 Non-insulin dependent type 2 diabetes mellitus ([place])   ? followed by pcp   (10-05-2019 per pt check cbg every other day in AM,  fasting cbg-- 105)  ? Numbness of right thumb   ? Wears glasses   ? ?Past Surgical History:  ?Past Surgical History:  ?Procedure Laterality Date  ? BREAST LUMPECTOMY WITH RADIOACTIVE SEED AND SENTINEL LYMPH NODE BIOPSY Left 06/09/2017  ? Procedure: BREAST LUMPECTOMY WITH RADIOACTIVE SEED AND SENTINEL LYMPH NODE BIOPSY;  Surgeon: Excell Seltzer, MD;  Location: [place];  Service: General;  Laterality: Left;  ? COLONOSCOPY  11/02/2017  ? polyps  ? HERNIA REPAIR    ? HIATAL HERNIA REPAIR  09/2019  ? IR RADIOLOGIST EVAL & MGMT  07/09/2021  ? IR RADIOLOGIST EVAL & MGMT  07/24/2021  ? IR RADIOLOGIST EVAL &  MGMT  09/30/2021  ? LAPAROSCOPIC ASSISTED VAGINAL HYSTERECTOMY N/A 11/04/2017  ? Procedure: LAPAROSCOPIC ASSISTED VAGINAL HYSTERECTOMY;  Surgeon: [name], MD;  Location: [facility];  Service: Gynecology;  Laterality: N/A;  ? LAPAROSCOPIC BILATERAL SALPINGO OOPHERECTOMY Bilateral 11/04/2017  ? Procedure: LAPAROSCOPIC BILATERAL SALPINGO OOPHORECTOMY;  Surgeon: [name], MD;  Location: [facility];  Service: Gynecology;  Laterality: Bilateral;  ? LAPAROSCOPY N/A 10/09/2019  ? Procedure: LAPAROSCOPY DIAGNOSTIC WITH PERITONEAL BIOPSIES;  Surgeon: [name],  MD;  Location: [facility];  Service: Gynecology;  Laterality: N/A;  ? PORTACATH PLACEMENT Right 06/09/2017  ? Procedure: INSERTION PORT-A-CATH WITH [place];  Surgeon: Excell Seltzer, MD;  Location: [facility];  Service: General;  Laterality: Right;  ? PORTACATH PLACEMENT Right 11/13/2019  ? Procedure: INSERTION PORT-A-CATH WITH ULTRASOUND GUIDANCE;  Surgeon: Rolm Bookbinder, MD;  Location: [facility];  Service: General;  Laterality: Right;  ? RADIOLOGY WITH ANESTHESIA N/A 08/27/2021  ? Procedure: IR WITH ANESTHESIA MICROWAVE ABLATION OF LIVER;  Surgeon: Aletta Edouard, MD;  Location: [facility];  Service: Radiology;  Laterality: N/A;  ? TUBAL LIGATION  02/02/2002   @[facility]   ? PPTL  ? UPPER GI ENDOSCOPY    ? ? ?Assessment & Plan ?Clinical Impression:  ? 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  Per chart review patient lives alone.  Her mother does assist on a daily basis.  1 level home 3 steps to entry.  Noted increased falls over the last 6 months as well as progressive lower extremity weakness/numbness and using a walker.  By report she was having some difficulty urination and bowel movement but denied any loss of control of bowel or bladder.  Presented 11/14/2021 with progressive lower extremity weakness.  CT/MRI of the brain no evidence of acute infarct redemonstrated chronic small vessel infarct within the left corona radiata and basal ganglia.  Similar to recent prior MRI 10/26/2021.  MRI lumbar spine  11/15/2021 with abnormal enhancement of the distal spinal cord and cauda equina consistent with leptomeningeal metastatic disease, multifocal osseous metastatic disease, L3 pathologic pression fracture with bony retropulsion and mild to moderate spinal canal narrowing.  MRI thoracic spine 11/15/2021 abnormal enhancement of the distal spinal cord extending inferiorly from T9, multifocal osseous metastatic disease.  MRI cervical spine thigh focal severe spinal canal stenosis with spinal cord flattening at C4-5 and C5-6.  Neurosurgery Dr. Christella Noa initially consulted patient not felt to be a surgical candidate.  Neurology as well as oncology Dr.Gudena consulted with review of MRI and felt patient's symptoms of lower extremity progressive weakness related to leptomeningeal disease.  She was placed on trial of Decadron therapy as well as case to be discussed with medical oncology Dr. Mickeal Skinner.  Her oral chemotherapy regimen has been resumed and she was to bring her medications in from home.  Possible H. pylori gastritis restart on triple therapy as an outpatient and continue at this time.  She has been placed on Lovenox for DVT prophylaxis.  She is tolerating a regular diet.  Therapy evaluations completed due to patient decreased functional mobility progressive lower extremity weakness was admitted for a comprehensive rehab  program. Patient transferred to CIR on 11/25/2021 .   ? ?Patient currently requires Max to Total A for basic ADLs secondary to muscle weakness, decreased cardiorespiratoy endurance, impaired timing and sequencing, decreased coordination, and decreased motor planning, and decreased sitting balance, decreased standing balance, decreased postural control, and decreased balance strategies.  Prior to hospitalization, patient could complete all self-care independently prior to the past couple of weeks but with at least min A in the weeks before admission. ? ?Patient will benefit from skilled intervention to  decrease level of assist with basic self-care skills and increase independence with basic self-care skills prior to discharge home with care partner.  Anticipate patient will require 24 hour supervision and m

## 2021-11-26 NOTE — Progress Notes (Signed)
Physical Therapy Session Note ? ?Patient Details  ?Name: Erin [name] ?MRN: [id number] ?Date of Birth: May 22, 1961 ? ?Today's Date: 11/26/2021 ?PT Missed Time: 60 Minutes ?Missed Time Reason: Patient fatigue;Patient ill (Comment) (nausea/vomiting) ? ?Short Term Goals: ?Week 1:  PT Short Term Goal 1 (Week 1): Pt will perform bed mobility with mod A consistently ?PT Short Term Goal 2 (Week 1): Pt will perform least restrictive transfer with max A x 1 consistently ?PT Short Term Goal 3 (Week 1): Pt will initiate w/c mobility ?PT Short Term Goal 4 (Week 1): Pt will maintain sitting balance x 5 min with min A ? ?Skilled Therapeutic Interventions/Progress Updates: Pt attempting to eat upon pt's arrival. PTA advised will return to allow pt to consume some applesauce or anything else. PTA returned x 15 min later with interpreter and pt's mom stating pt was unable to keep food down. Pt only requesting to rest at this time. PTA advised pt and mom and we will attempt to spread out therapies as well as have changed to 15/7 (explained less hours a day but therapy now x7 days/wk. Pt indicated understanding. Pt left in bed with call bell within reach and current needs met.  ? ? ?Therapy Documentation ?Precautions:  ?Precautions ?Precautions: Fall ?Precaution Comments: ASL interpreter needed; high fall risk ?Restrictions ?Weight Bearing Restrictions: No ?General: ?PT Amount of Missed Time (min): 60 Minutes ?PT Missed Treatment Reason: Patient fatigue;Patient ill (Comment) (nausea/vomiting) ?Vital Signs: ?Therapy Vitals ?Temp: 98.2 ?F (36.8 ?C) ?Temp Source: Oral ?Pulse Rate: (!) 106 ?Resp: 20 ?BP: (!) 124/91 ?Patient Position (if appropriate): Lying ?Oxygen Therapy ?SpO2: 99 % ?O2 Device: Room Air ?Pain: ?  ? ? ? ? ?Therapy/Group: Individual Therapy ? ?Maribel Luis ?11/26/2021, 1:37 PM  ?

## 2021-11-27 ENCOUNTER — Inpatient Hospital Stay (HOSPITAL_COMMUNITY): Payer: Medicare Other

## 2021-11-27 ENCOUNTER — Encounter (HOSPITAL_COMMUNITY): Payer: Self-pay | Admitting: Physical Medicine and Rehabilitation

## 2021-11-27 ENCOUNTER — Inpatient Hospital Stay (HOSPITAL_COMMUNITY)
Admission: AD | Admit: 2021-11-27 | Discharge: 2021-11-30 | DRG: 682 | Disposition: A | Discharge status: Hospice | Payer: Medicare Other | Source: Ambulatory Visit | Attending: Family Medicine | Admitting: Family Medicine

## 2021-11-27 DIAGNOSIS — Z66 Do not resuscitate: Secondary | ICD-10-CM | POA: Diagnosis not present

## 2021-11-27 DIAGNOSIS — Z515 Encounter for palliative care: Secondary | ICD-10-CM

## 2021-11-27 DIAGNOSIS — L89152 Pressure ulcer of sacral region, stage 2: Secondary | ICD-10-CM | POA: Diagnosis present

## 2021-11-27 DIAGNOSIS — K219 Gastro-esophageal reflux disease without esophagitis: Secondary | ICD-10-CM | POA: Diagnosis present

## 2021-11-27 DIAGNOSIS — N390 Urinary tract infection, site not specified: Secondary | ICD-10-CM | POA: Diagnosis present

## 2021-11-27 DIAGNOSIS — E1165 Type 2 diabetes mellitus with hyperglycemia: Secondary | ICD-10-CM | POA: Diagnosis present

## 2021-11-27 DIAGNOSIS — K5900 Constipation, unspecified: Secondary | ICD-10-CM | POA: Diagnosis not present

## 2021-11-27 DIAGNOSIS — E119 Type 2 diabetes mellitus without complications: Secondary | ICD-10-CM

## 2021-11-27 DIAGNOSIS — R627 Adult failure to thrive: Secondary | ICD-10-CM | POA: Diagnosis present

## 2021-11-27 DIAGNOSIS — E872 Acidosis, unspecified: Secondary | ICD-10-CM | POA: Diagnosis present

## 2021-11-27 DIAGNOSIS — E860 Dehydration: Secondary | ICD-10-CM | POA: Diagnosis present

## 2021-11-27 DIAGNOSIS — Z8049 Family history of malignant neoplasm of other genital organs: Secondary | ICD-10-CM

## 2021-11-27 DIAGNOSIS — E875 Hyperkalemia: Secondary | ICD-10-CM | POA: Diagnosis present

## 2021-11-27 DIAGNOSIS — E871 Hypo-osmolality and hyponatremia: Secondary | ICD-10-CM | POA: Diagnosis present

## 2021-11-27 DIAGNOSIS — N1330 Unspecified hydronephrosis: Secondary | ICD-10-CM | POA: Diagnosis present

## 2021-11-27 DIAGNOSIS — H9190 Unspecified hearing loss, unspecified ear: Secondary | ICD-10-CM | POA: Diagnosis present

## 2021-11-27 DIAGNOSIS — E876 Hypokalemia: Secondary | ICD-10-CM | POA: Diagnosis not present

## 2021-11-27 DIAGNOSIS — C787 Secondary malignant neoplasm of liver and intrahepatic bile duct: Secondary | ICD-10-CM | POA: Diagnosis present

## 2021-11-27 DIAGNOSIS — G9341 Metabolic encephalopathy: Secondary | ICD-10-CM | POA: Diagnosis present

## 2021-11-27 DIAGNOSIS — Z8249 Family history of ischemic heart disease and other diseases of the circulatory system: Secondary | ICD-10-CM

## 2021-11-27 DIAGNOSIS — L8990 Pressure ulcer of unspecified site, unspecified stage: Secondary | ICD-10-CM | POA: Insufficient documentation

## 2021-11-27 DIAGNOSIS — C7949 Secondary malignant neoplasm of other parts of nervous system: Secondary | ICD-10-CM | POA: Diagnosis present

## 2021-11-27 DIAGNOSIS — Z6821 Body mass index (BMI) 21.0-21.9, adult: Secondary | ICD-10-CM

## 2021-11-27 DIAGNOSIS — C7951 Secondary malignant neoplasm of bone: Secondary | ICD-10-CM | POA: Diagnosis present

## 2021-11-27 DIAGNOSIS — N179 Acute kidney failure, unspecified: Secondary | ICD-10-CM

## 2021-11-27 DIAGNOSIS — B370 Candidal stomatitis: Secondary | ICD-10-CM | POA: Diagnosis not present

## 2021-11-27 DIAGNOSIS — R5381 Other malaise: Secondary | ICD-10-CM | POA: Diagnosis not present

## 2021-11-27 DIAGNOSIS — I959 Hypotension, unspecified: Secondary | ICD-10-CM | POA: Diagnosis present

## 2021-11-27 DIAGNOSIS — C50919 Malignant neoplasm of unspecified site of unspecified female breast: Secondary | ICD-10-CM | POA: Diagnosis present

## 2021-11-27 DIAGNOSIS — G9340 Encephalopathy, unspecified: Secondary | ICD-10-CM | POA: Diagnosis not present

## 2021-11-27 LAB — PROCALCITONIN: Procalcitonin: 0.26 ng/mL

## 2021-11-27 LAB — COMPREHENSIVE METABOLIC PANEL
ALT: 52 U/L — ABNORMAL HIGH (ref 0–44)
AST: 30 U/L (ref 15–41)
Albumin: 3.2 g/dL — ABNORMAL LOW (ref 3.5–5.0)
Alkaline Phosphatase: 82 U/L (ref 38–126)
Anion gap: 18 — ABNORMAL HIGH (ref 5–15)
BUN: 67 mg/dL — ABNORMAL HIGH (ref 6–20)
CO2: 13 mmol/L — ABNORMAL LOW (ref 22–32)
Calcium: 7.2 mg/dL — ABNORMAL LOW (ref 8.9–10.3)
Chloride: 103 mmol/L (ref 98–111)
Creatinine, Ser: 1.59 mg/dL — ABNORMAL HIGH (ref 0.44–1.00)
GFR, Estimated: 37 mL/min — ABNORMAL LOW (ref >60)
Glucose, Bld: 569 mg/dL (ref 70–99)
Potassium: 5.5 mmol/L — ABNORMAL HIGH (ref 3.5–5.1)
Sodium: 134 mmol/L — ABNORMAL LOW (ref 135–145)
Total Bilirubin: 1.2 mg/dL (ref 0.3–1.2)
Total Protein: 5.9 g/dL — ABNORMAL LOW (ref 6.5–8.1)

## 2021-11-27 LAB — CBC WITH DIFFERENTIAL/PLATELET
Abs Immature Granulocytes: 0.44 10*3/uL — ABNORMAL HIGH (ref 0.00–0.07)
Basophils Absolute: 0.1 10*3/uL (ref 0.0–0.1)
Basophils Relative: 0 %
Eosinophils Absolute: 0 10*3/uL (ref 0.0–0.5)
Eosinophils Relative: 0 %
HCT: 42.2 % (ref 36.0–46.0)
Hemoglobin: 14.2 g/dL (ref 12.0–15.0)
Immature Granulocytes: 2 %
Lymphocytes Relative: 2 %
Lymphs Abs: 0.3 10*3/uL — ABNORMAL LOW (ref 0.7–4.0)
MCH: 33.1 pg (ref 26.0–34.0)
MCHC: 33.6 g/dL (ref 30.0–36.0)
MCV: 98.4 fL (ref 80.0–100.0)
Monocytes Absolute: 0.9 10*3/uL (ref 0.1–1.0)
Monocytes Relative: 5 %
Neutro Abs: 18.4 10*3/uL — ABNORMAL HIGH (ref 1.7–7.7)
Neutrophils Relative %: 91 %
Platelets: 126 10*3/uL — ABNORMAL LOW (ref 150–400)
RBC: 4.29 MIL/uL (ref 3.87–5.11)
RDW: 16.8 % — ABNORMAL HIGH (ref 11.5–15.5)
WBC: 20.1 10*3/uL — ABNORMAL HIGH (ref 4.0–10.5)
nRBC: 0.3 % — ABNORMAL HIGH (ref 0.0–0.2)

## 2021-11-27 LAB — BASIC METABOLIC PANEL
Anion gap: 18 — ABNORMAL HIGH (ref 5–15)
Anion gap: 11 (ref 5–15)
BUN: 73 mg/dL — ABNORMAL HIGH (ref 6–20)
BUN: 41 mg/dL — ABNORMAL HIGH (ref 6–20)
CO2: 14 mmol/L — ABNORMAL LOW (ref 22–32)
CO2: 20 mmol/L — ABNORMAL LOW (ref 22–32)
Calcium: 7.5 mg/dL — ABNORMAL LOW (ref 8.9–10.3)
Calcium: 7.2 mg/dL — ABNORMAL LOW (ref 8.9–10.3)
Chloride: 99 mmol/L (ref 98–111)
Chloride: 110 mmol/L (ref 98–111)
Creatinine, Ser: 1.76 mg/dL — ABNORMAL HIGH (ref 0.44–1.00)
Creatinine, Ser: 0.93 mg/dL (ref 0.44–1.00)
GFR, Estimated: 33 mL/min — ABNORMAL LOW (ref >60)
GFR, Estimated: >60 mL/min (ref >60)
Glucose, Bld: 667 mg/dL (ref 70–99)
Glucose, Bld: 255 mg/dL — ABNORMAL HIGH (ref 70–99)
Potassium: 6.1 mmol/L — ABNORMAL HIGH (ref 3.5–5.1)
Potassium: 4 mmol/L (ref 3.5–5.1)
Sodium: 131 mmol/L — ABNORMAL LOW (ref 135–145)
Sodium: 141 mmol/L (ref 135–145)

## 2021-11-27 LAB — URINALYSIS, ROUTINE W REFLEX MICROSCOPIC
Bilirubin Urine: NEGATIVE
Glucose, UA: >=500 mg/dL — AB
Ketones, ur: 5 mg/dL — AB
Nitrite: NEGATIVE
Protein, ur: NEGATIVE mg/dL
Specific Gravity, Urine: 1.026 (ref 1.005–1.030)
WBC, UA: >50 WBC/hpf — ABNORMAL HIGH (ref 0–5)
pH: 5 (ref 5.0–8.0)

## 2021-11-27 LAB — GLUCOSE, CAPILLARY
Glucose-Capillary: 209 mg/dL — ABNORMAL HIGH (ref 70–99)
Glucose-Capillary: 221 mg/dL — ABNORMAL HIGH (ref 70–99)
Glucose-Capillary: 235 mg/dL — ABNORMAL HIGH (ref 70–99)
Glucose-Capillary: 257 mg/dL — ABNORMAL HIGH (ref 70–99)
Glucose-Capillary: 321 mg/dL — ABNORMAL HIGH (ref 70–99)
Glucose-Capillary: 393 mg/dL — ABNORMAL HIGH (ref 70–99)
Glucose-Capillary: 496 mg/dL — ABNORMAL HIGH (ref 70–99)
Glucose-Capillary: 519 mg/dL (ref 70–99)

## 2021-11-27 LAB — MRSA NEXT GEN BY PCR, NASAL: MRSA by PCR Next Gen: NOT DETECTED

## 2021-11-27 LAB — MAGNESIUM: Magnesium: 3.1 mg/dL — ABNORMAL HIGH (ref 1.7–2.4)

## 2021-11-27 LAB — LACTIC ACID, PLASMA
Lactic Acid, Venous: 3.5 mmol/L (ref 0.5–1.9)
Lactic Acid, Venous: 3.8 mmol/L (ref 0.5–1.9)
Lactic Acid, Venous: 3.4 mmol/L (ref 0.5–1.9)

## 2021-11-27 LAB — BETA-HYDROXYBUTYRIC ACID: Beta-Hydroxybutyric Acid: 5.14 mmol/L — ABNORMAL HIGH (ref 0.05–0.27)

## 2021-11-27 MED ORDER — PROCHLORPERAZINE EDISYLATE 10 MG/2ML IJ SOLN
10.0000 mg | Freq: Four times a day (QID) | INTRAMUSCULAR | Status: DC | PRN
  Administered 2021-11-27: 10 mg via INTRAVENOUS

## 2021-11-27 MED ORDER — LACTATED RINGERS IV SOLN: INTRAVENOUS | Status: DC

## 2021-11-27 MED ORDER — ENOXAPARIN SODIUM 30 MG/0.3ML IJ SOSY: 30.0000 mg | PREFILLED_SYRINGE | INTRAMUSCULAR | Status: DC

## 2021-11-27 MED ORDER — SODIUM CHLORIDE 0.9 % IV SOLN: 2.0000 g | INTRAVENOUS | Status: DC

## 2021-11-27 MED ORDER — DEXTROSE IN LACTATED RINGERS 5 % IV SOLN: INTRAVENOUS | Status: DC

## 2021-11-27 MED ORDER — INSULIN ASPART 100 UNIT/ML IJ SOLN: 0.0000 [IU] | INTRAMUSCULAR | Status: DC

## 2021-11-27 MED ORDER — SODIUM CHLORIDE 0.9 % IV SOLN: INTRAVENOUS | Status: DC

## 2021-11-27 MED ORDER — INSULIN REGULAR(HUMAN) IN NACL 100-0.9 UT/100ML-% IV SOLN
INTRAVENOUS | Status: DC
  Administered 2021-11-27: 9 [IU]/h via INTRAVENOUS
  Administered 2021-11-28: 3.2 [IU]/h via INTRAVENOUS
  Filled 2021-11-27 (×3): qty 100

## 2021-11-27 MED ORDER — DEXTROSE 50 % IV SOLN: 0.0000 mL | INTRAVENOUS | Status: DC | PRN

## 2021-11-27 MED ORDER — SODIUM CHLORIDE 0.9 % IV BOLUS
1000.0000 mL | Freq: Once | INTRAVENOUS | Status: AC
  Administered 2021-11-27: 1000 mL via INTRAVENOUS

## 2021-11-27 MED ORDER — CHLORHEXIDINE GLUCONATE CLOTH 2 % EX PADS
6.0000 | MEDICATED_PAD | Freq: Every day | CUTANEOUS | Status: DC
  Administered 2021-11-27 – 2021-11-30 (×4): 6 via TOPICAL

## 2021-11-27 MED ORDER — SODIUM BICARBONATE 8.4 % IV SOLN: 50.0000 meq | Freq: Once | INTRAVENOUS | Status: DC

## 2021-11-27 MED ORDER — BISACODYL 10 MG RE SUPP
10.0000 mg | Freq: Every day | RECTAL | Status: DC | PRN
  Filled 2021-11-27: qty 1

## 2021-11-27 MED ORDER — LACTATED RINGERS IV BOLUS
1000.0000 mL | Freq: Once | INTRAVENOUS | Status: AC
  Administered 2021-11-27: 1000 mL via INTRAVENOUS

## 2021-11-27 MED ORDER — ALTEPLASE 2 MG IJ SOLR
2.0000 mg | Freq: Once | INTRAMUSCULAR | Status: AC
  Administered 2021-11-27: 2 mg
  Filled 2021-11-27: qty 2

## 2021-11-27 MED ORDER — HEPARIN SODIUM (PORCINE) 5000 UNIT/ML IJ SOLN
5000.0000 [IU] | Freq: Three times a day (TID) | INTRAMUSCULAR | Status: DC
  Administered 2021-11-27 – 2021-11-30 (×7): 5000 [IU] via SUBCUTANEOUS
  Filled 2021-11-27 (×7): qty 1

## 2021-11-27 MED ORDER — LACTATED RINGERS IV BOLUS: 1000.0000 mL | Freq: Once | INTRAVENOUS | Status: DC

## 2021-11-27 MED ORDER — PROMETHAZINE HCL 12.5 MG PO TABS
12.5000 mg | ORAL_TABLET | Freq: Four times a day (QID) | ORAL | Status: DC | PRN
  Administered 2021-11-27: 12.5 mg via ORAL
  Filled 2021-11-27 (×2): qty 1

## 2021-11-27 MED ORDER — ONDANSETRON HCL 4 MG PO TABS: 4.0000 mg | ORAL_TABLET | Freq: Every day | ORAL | Status: DC

## 2021-11-27 MED ORDER — SODIUM CHLORIDE 0.9 % IV SOLN
2.0000 g | Freq: Once | INTRAVENOUS | Status: AC
  Administered 2021-11-27: 2 g via INTRAVENOUS
  Filled 2021-11-27: qty 2

## 2021-11-27 MED ORDER — FAMOTIDINE 20 MG PO TABS: 20.0000 mg | ORAL_TABLET | Freq: Every day | ORAL | Status: DC

## 2021-11-27 MED ORDER — IOHEXOL 300 MG/ML  SOLN
100.0000 mL | Freq: Once | INTRAMUSCULAR | Status: AC | PRN
  Administered 2021-11-27: 100 mL via INTRAVENOUS

## 2021-11-27 MED ORDER — SODIUM CHLORIDE 0.9 % IV SOLN
INTRAVENOUS | Status: DC | PRN
Start: 2021-11-27 — End: 2021-11-30

## 2021-11-27 MED ORDER — SODIUM BICARBONATE 8.4 % IV SOLN
50.0000 meq | Freq: Once | INTRAVENOUS | Status: AC
  Administered 2021-11-27: 50 meq via INTRAVENOUS
  Filled 2021-11-27: qty 50

## 2021-11-27 NOTE — Progress Notes (Signed)
Inpatient Rehabilitation Discharge Medication Review by a Pharmacist ? ?A complete drug regimen review was completed for this patient to identify any potential clinically significant medication issues. ? ?High Risk Drug Classes Is patient taking? Indication by Medication  ?Antipsychotic No   ?Anticoagulant Yes Lovenox- VTE prophylaxis  ?Antibiotic Yes fluconazole- thrush    ?Opioid No   ?Antiplatelet No   ?Hypoglycemics/insulin No   ?Vasoactive Medication No   ?Chemotherapy Yes, Oral Chemotherapy Xeloda, Tukysa- metastatic breast CA  ?Other No   ? ? ? ?Type of Medication Issue Identified Description of Issue Recommendation(s)  ?Drug Interaction(s) (clinically significant) ?    ?Duplicate Therapy ?    ?Allergy ?    ?No Medication Administration End Date ?    ?Incorrect Dose ?    ?Additional Drug Therapy Needed ?    ?Significant med changes from prior encounter (inform family/care partners about these prior to discharge).    ?Other ?    ? ? ?Clinically significant medication issues were identified that warrant physician communication and completion of prescribed/recommended actions by midnight of the next day:  No ? ?Name of provider notified for urgent issues identified:  ? ?Provider Method of Notification:  ? ? ? ?Pharmacist comments: Patient is being transferred to ICU after a rapid response.  ? ?Time spent performing this drug regimen review (minutes):  30 ? ? ?Erin [name] BS, PharmD, BCPS ?Clinical Pharmacist ?11/27/2021 3:32 PM ? ? ? ? ?

## 2021-11-27 NOTE — Progress Notes (Signed)
Physical Therapy Note ? ?Patient Details  ?Name: Erin [name] ?MRN: [id number] ?Date of Birth: 31-Dec-1960 ?Today's Date: 11/27/2021 ? ? ? ?Physical Therapy Discharge Note ? ?This patient was unable to complete the inpatient rehab program due to increased acute medical needs requiring transfer to ICU; therefore did not meet their long term goals. Pt left the program at a maxA assist level for their functional mobility/ transfers. This patient is being discharged from PT services at this time. ? ?Pt's perception of pain in the last five days was unable to answer at this time.  ? ? ?See CareTool for functional status details ? ?If the patient is able to return to inpatient rehabilitation within 3 midnights, this may be considered an interrupted stay and therapy services will resume as ordered. Modification and reinstatement of their goals will be made upon completion of therapy service reevaluations.    ? ? ? ?Erin [name] ?11/27/2021, 3:55 PM  ?Lars Masson, PT, DPT, CBIS ? ?

## 2021-11-27 NOTE — Plan of Care (Signed)
Medical status decline; discharge to acute and unable to complete rehab program. ?

## 2021-11-27 NOTE — Progress Notes (Signed)
Pat                                                                                                      ient ID: Erin [name], female   DOB: 1961/01/13, 61 y.o.   MRN: [id number] ? ?SW received message from pt sister Tressia Miners requesting return phone call. SW went by pt room to discuss speaking with pt sister, and pt was sleep. Pt mother indicated it was OK. Pt sister on [facility] Directive on file.  ? ?61- SW returned phone call to pt sister Tressia Miners. She was inquiring on what things could be done to assist her sister, and mother since her mother is the primary caregiver. SW discussed current challenges, and plan of care at discharge. Hopeful that her sister will be able to navigate stairs. SW shared will speak with pt about PCS and CAP/DA services if she is interested for additional help in the home.  ? ?Loralee Pacas, MSW, LCSWA ?Office: [phone] ?Cell: [phone] ?Fax: [phone]  ?

## 2021-11-27 NOTE — Discharge Summary (Signed)
Physician Discharge Summary  ?Patient ID: ?Erin [name] ?MRN: [id number] ?DOB/AGE: 1961/04/03 61 y.o. ? ?Admit date: 11/25/2021 ?Discharge date: 11/27/2021 ? ?Discharge Diagnoses:  ?Principal Problem: ?  Metastatic breast cancer ([place]) ?Active Problems: ?  Leucocytosis ?  Hearing loss ?  Gastritis ?  DM type 2 (diabetes mellitus, type 2) ([place]) ?  Bone metastases ([place]) ?  Radiation-induced esophagitis ?  Leptomeningeal metastases ([place]) ?  Lower extremity weakness ?  Acute encephalopathy ?  Obtundation ?  Hypotension ?  Acute renal failure ([place]) ?  Hyperkalemia ?  Abdominal pain ? ? ?Discharged Condition: serious ? ?Significant Diagnostic Studies: ?CT HEAD WO CONTRAST (5MM) ? ?Result Date: 11/27/2021 ?CLINICAL DATA:  Altered mental status. History of metastatic breast cancer. EXAM: CT HEAD WITHOUT CONTRAST TECHNIQUE: Contiguous axial images were obtained from the base of the skull through the vertex without intravenous contrast. RADIATION DOSE REDUCTION: This exam was performed according to the departmental dose-optimization program which includes automated exposure control, adjustment of the mA and/or kV according to patient size and/or use of iterative reconstruction technique. COMPARISON:  Head CT and MRI 11/14/2021 FINDINGS: Brain: There is no evidence of an acute infarct, intracranial hemorrhage, mass, midline shift, or extra-axial fluid collection. 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 Hypodensity in the cerebellar vermis and medial aspects of the left greater than right cerebellar hemispheres is similar to the prior CT and may reflect mild residual edema or gliosis related to previously shown leptomeningeal disease in this region. There is no associated mass effect. Ex vacuo dilatation of the fourth ventricle is unchanged. The lateral and third ventricles are normal in size. Vascular: No hyperdense vessel. Skull: No acute fracture or suspicious osseous lesion. Sinuses/Orbits: Visualized  paranasal sinuses and mastoid air cells are clear. Unremarkable orbits. Other: None. IMPRESSION: 1. No evidence of acute intracranial abnormality. 2. Chronic findings as above. Electronically Signed   By: Logan Bores M.D.   On: 11/27/2021 14:35  ? ?CT ABDOMEN PELVIS W CONTRAST ? ?Result Date: 11/27/2021 ?CLINICAL DATA:  61 year old female with history of metastatic breast cancer presenting with clinical concern for peritonitis or bowel perforation. EXAM: CT ABDOMEN AND PELVIS WITH CONTRAST TECHNIQUE: Multidetector CT imaging of the abdomen and pelvis was performed using the standard protocol following bolus administration of intravenous contrast. RADIATION DOSE REDUCTION: This exam was performed according to the departmental dose-optimization program which includes automated exposure control, adjustment of the mA and/or kV according to patient size and/or use of iterative reconstruction technique. CONTRAST:  147m OMNIPAQUE IOHEXOL 300 MG/ML  SOLN COMPARISON:  10/24/2021 FINDINGS: Lower chest: No acute abnormality. Unchanged paravertebral bilateral lower lobe subsegmental atelectasis. Hepatobiliary: Again seen is a 3.3 x 2.3 cm well-defined rounded hypodensity in hepatic segment 5. Interval development in increased conspicuity of innumerable scattered rounded hypodensities throughout the hepatic parenchyma, most prominent in the right lobe. Pancreas: Unremarkable. No pancreatic ductal dilatation or surrounding inflammatory changes. Spleen: Normal in size without focal abnormality. Adrenals/Urinary Tract: Adrenal glands are unremarkable. Mild bilateral hydronephrosis and hydroureter. Bladder is markedly distended, extending to just superior to the level of the umbilicus. Stomach/Bowel: Small hiatal hernia. Stomach is within normal limits. No evidence of significant small bowel distension or mural thickening. Moderate colonic stool burden. No bowel wall thickening or significant distension. Vascular/Lymphatic: No  significant vascular findings are present. No enlarged abdominal or pelvic lymph nodes. Reproductive: Status post hysterectomy. No adnexal masses. Other: No abdominal wall hernia or abnormality. No abdominopelvic ascites. Musculoskeletal: Diffuse osteoblastic metastatic  lesions are again seen throughout the visualized axial skeleton. No evidence of pathologic fracture. IMPRESSION: 1. Marked distension of the urinary bladder with associated mild bilateral hydronephrosis concerning for bladder outlet obstruction. 2. Poorly defined innumerable rounded hypodensities throughout the hepatic parenchyma concerning for multifocal metastatic foci. 3. Similar appearing diffuse sclerotic metastases of the visualized axillary skeleton. 4. Similar appearing small hiatal hernia. Ruthann Cancer, MD [facility] Radiology Electronically Signed   By: Ruthann Cancer M.D.   On: 11/27/2021 14:45  ? ?DG CHEST PORT 1 VIEW ? ?Result Date: 11/27/2021 ?CLINICAL DATA:  Breast cancer patient with metastases. New leukocytosis on steroids. Very lethargic. EXAM: PORTABLE CHEST 1 VIEW COMPARISON:  CT chest 10/24/2021 FINDINGS: Right chest wall porta catheter tip overlies the central superior vena cava. Minimal calcification within the aortic arch. Cardiac silhouette and mediastinal contours are within normal limits. Horizontal bandlike mild scarring overlying the mid to lower right lung also seen within the superior segment of the right lower lobe on prior CT. The left lung is clear. No pleural effusion or pneumothorax. There is air seen just deep to the left hemidiaphragm presumably within the stomach. There is also air within distal transverse colon overlying this region of the stomach. No definite pneumoperitoneum is seen, as the right hemidiaphragm is slightly higher than the left and no right subdiaphragmatic free air is seen. No acute skeletal abnormality. There are sclerotic foci within the  bilateral proximal humeri and the right glenoid consistent with known osseous metastases. IMPRESSION:: IMPRESSION: 1. Unchanged horizontal bandlike mild scarring overlying the mid to lower right lung. No acute lung process. 2. Air just deep to the left hemidiaphragm is favored to be within patient's stomach. If there is clinical concern for pneumoperitoneum, consider abdominal radiographs including right-side-up decubitus view. Electronically Signed   By: Yvonne Kendall M.D.   On: 11/27/2021 09:39  ? ?VAS [place] LOWER EXTREMITY VENOUS (DVT) ? ?Result Date: 11/25/2021 ? Lower Venous DVT Study Patient Name:  Erin [name]  Date of Exam:   11/25/2021 Medical Rec #: [id number]       Accession #:    [id number] Date of Birth: [birth year]      Patient Gender: F Patient Age:   65 years Exam Location:  [facility] Procedure:      VAS [place] LOWER EXTREMITY VENOUS (DVT) Referring Phys: Lauraine Rinne --------------------------------------------------------------------------------  Indications: Edema.  Limitations: Poor ultrasound/tissue interface. Comparison Study: No prior study Performing Technologist: Maudry Mayhew MHA, RDMS, RVT, RDCS  Examination Guidelines: A complete evaluation includes B-mode imaging, spectral Doppler, color Doppler, and power Doppler as needed of all accessible portions of each vessel. Bilateral testing is considered an integral part of a complete examination. Limited examinations for reoccurring indications may be performed as noted. The reflux portion of the exam is performed with the patient in reverse Trendelenburg.  +---------+---------------+---------+-----------+----------+--------------+ RIGHT    CompressibilityPhasicitySpontaneityPropertiesThrombus Aging +---------+---------------+---------+-----------+----------+--------------+ CFV      Full           Yes      Yes                                 +---------+---------------+---------+-----------+----------+--------------+ SFJ       Full                                                        +---------+---------------+---------+-----------+----------+--------------+  FV Prox  Full                                                        +---------+--------

## 2021-11-27 NOTE — Significant Event (Addendum)
Rapid Response Event Note  ? ?Reason for Call :  ?Requested at bedside by provider. ? ?Initial Focused Assessment:  ?Pt lying in bed, eyes closed. She is lethargic, but will briefly open her eyes and sustain eye contact. She grimaces to pain in all extremities and withdrawals from pain. She is noted to have purposeful movement. Attempted to use ALS, via translator, to communicate with pt, but she could not keep her eyes open to see the communication. ? ?Skin is warm, dry, tenting. Lung sounds are clear, diminished. Abdomen is soft with faint, hypoactive bowel sounds. Lower abdomen is noted to be distended. Heart rate is tachycardic, palpated rhythm is regular.  ? ?VS: T 98.40F, BP 80/62, HR 131, RR 14, SpO2 97% on room air ?CBG: 496 ? ?Interventions:  ?-1L IVF bolus ?-CT A/P - 20g IV access established in the RAC ?-Foley catheter inserted for acute urinary retention ?-Admit to ICU ? ?Plan of Care:  ?Admit to ICU ? ?Event Summary:  ?MD Notified: Dr. Dagoberto Ligas ?Call Time: 1325 ?Arrival Time: 6294 ?End Time: 7654 ? ?Casimer Bilis, RN ?

## 2021-11-27 NOTE — Progress Notes (Signed)
Spoke to [place], [place] about results of chest xray. Order obtained for STAT CT, CBC and lactic acid.  ?

## 2021-11-27 NOTE — Progress Notes (Signed)
Inpatient Rehabilitation Care Coordinator ?Discharge Note  ? ?Patient Details  ?Name: Erin [name] ?MRN: [id number] ?Date of Birth: [birth year] ? ? ?Discharge location: TRANSFERRING TO ACUTE DUE TO MEDICAL ISSUES ? ?Length of Stay: 2 DAYS ? ?Discharge activity level: MOD-MAX ASSIST  ? ?Home/community participation:   ? ?Patient response MG:NOIBBC Literacy - How often do you need to have someone help you when you read instructions, pamphlets, or other written material from your doctor or pharmacy?: Rarely ? ?Patient response WU:GQBVQX Isolation - How often do you feel lonely or isolated from those around you?: Never ? ?Services provided included: MD, RD, PT, OT, SLP, RN, CM, Pharmacy, SW ? ?Financial Services:  ?Charity fundraiser Utilized: Medicare ?  ? ?Choices offered to/list presented to:   ? ?Follow-up services arranged:  ?  ?   ?  ?  ?  ? ?Patient response to transportation need: ?Is the patient able to respond to transportation needs?: Yes ?In the past 12 months, has lack of transportation kept you from medical appointments or from getting medications?: No ?In the past 12 months, has lack of transportation kept you from meetings, work, or from getting things needed for daily living?: No ? ? ? ?Comments (or additional information): TRANSFERRING TO ACUTE FOR MEDICAL ISSUES ? ?Patient/Family verbalized understanding of follow-up arrangements:  Yes ? ?Individual responsible for coordination of the follow-up plan: SLAYDELL COHEN-MOM [phone] ? ?Confirmed correct DME delivered: Elease Hashimoto 11/27/2021   ? ?Elease Hashimoto ?

## 2021-11-27 NOTE — Plan of Care (Signed)
?  Interdisciplinary Goals of Care Family Meeting ? ? ?Date carried out:: 11/27/2021 ? ?Location of the meeting: Unit ? ?Member's involved: Physician and Family Member or next of kin ? ?Durable Power of Tour manager: Mother, Amparo Bristol   ? ?Discussion: We discussed goals of care for [name] .  Reviewed her acute illness and organ system injuries. Discussed the likelihood that some invasive aggressive interventions may not change her longevity, overall outcome, and may negatively impact her quality of life. Such interventions include mechanical ventilation and ACLS. Family understood these issues, does appreciate that we will need to discuss which interventions may be helpful vs hurtful going forward. At this time no plan to change the patient's stated wishes, which have been to have all aggressive care under any circumstances. Will need to continue to discuss.  ? ?Code status: Full Code ? ?Disposition: Continue current acute care ? ? ?Time spent for the meeting: 35 min ? ?Rose Fillers Jahmir Salo ?11/27/2021, 5:00 PM ? ?

## 2021-11-27 NOTE — Consult Note (Addendum)
? ? ? ? ?Consult Note ? ?Erin [name] ?02/02/1961  ?[id number].   ? ?Requesting MD: Dr. Dagoberto Ligas ?Chief Complaint/Reason for Consult: abdominal pain, possible pneumoperitoneum on abdominal xray ? 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  ?She was admitted from home to [facility] hospitalist service 3/3-3/14 for progressive lower extremity weakness and falls. Neurosurgery, neurology, and oncology consulted during admission and patient was not considered a surgical candidate and continued on oral chemotherapy regimen. She has been on daily decadron. She was diagnosed with possible H. Pylori gastritis outpatient and complete a 2 weeks course of triple therapy treatment. She was discharged to CIR on 3/14. ? ?Today patient was noted to have more abdominal pain and decline in mental status. She has had nausea and low appetite as well as pain with swallowing attributed to bad reflux and thrush. She had a chest xray this morning that reported air just deep to the left hemidiaphragm favored to be within patient's stomach but cannot completely rule out pneumoperitoneum. General surgery asked to see. ? ?At time of my exam she is uncomfortable and minimally responsive - history gathered from chart, primary team, and sister who is bedside. Per sister she has had poor appetite and intermittent n/v for a few weeks. She cannot give me many specific details of her history. ? ?Abdominal surgery history includes hiatal hernia repair, lap hysterectomy, dx lap with omental biopsies  10/09/19 ? ?NKDA ? ?ROS: ?Review of Systems  ?Unable to perform ROS: Mental status change  ? ?Family History  ?Problem Relation Age of Onset  ? Hypertension Mother   ? Lupus Mother   ? Diabetes Father   ? Hypertension Sister   ? Diabetes Paternal Grandmother   ? Colon cancer Paternal Grandmother 68  ? CAD Brother   ? Diabetes Brother   ? Heart attack Brother   ? Breast cancer Maternal Aunt   ?     dx >50  ? Heart attack Paternal Grandfather   ? Breast cancer Maternal Aunt   ?     dx under 78  ? Breast cancer Maternal Aunt   ?     dx  under 3  ? ? ?Past Medical History:  ?Diagnosis Date  ? Arthritis   ? knees, elbows  ? Borderline glaucoma of right eye   ? BRCA2 gene mutation positive 02/17/2018  ? BRCA2 [id number] ([id number])  Result reported out on 02/15/2018.   ? Breast cancer ([place]) 05/2017  ? left  ? Chronic back pain   ? Deaf   ? per pt born hearing and at age 19 1/2 lost hearing , was told by mother unknown cause, can miminally hear in left and no hearing on right   ? Depression   ? Elevated cancer antigen 125 (CA 125)   ? Family history of breast cancer   ? Family history of breast cancer   ? Family history of colon cancer   ? Genetic testing 02/17/2018  ? The Common Hereditary Cancer Panel offered by [facility] includes sequencing and/or deletion duplication testing of the following 47 genes: APC, ATM, AXIN2, BARD1, BMPR1A, BRCA1, BRCA2, BRIP1, CDH1, CDKN2A (p14ARF),  CDKN2A (p16INK4a), CKD4, CHEK2, CTNNA1, DICER1, EPCAM (Deletion/duplication testing only), GREM1 (promoter region deletion/duplication testing only), KIT, MEN1, MLH1, MSH2, MSH3, MSH6, MU  ? GERD (gastroesophageal reflux disease)   ? History of cancer chemotherapy   ? left breast cancer 09-08-2017 to 10-09-2017;  fallopion tube cancer  12-15-2017  to 06-28-2018  ? 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 History of external beam radiation therapy   ? left breast  11-25-2017  to 01-07-2018  ? Hyperlipidemia   ? Hypertension   ? followed by pcp   (10-05-2019  per pt never had stress test)  ? IDA (iron deficiency anemia)   ? Malignant neoplasm of lower-outer quadrant of left breast of female, estrogen receptor negative [facility]) oncologist-- dr [name]  ? 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 Non-insulin dependent type 2 diabetes mellitus ([place])   ? followed by pcp   (10-05-2019 per pt check cbg every other day in AM,  fasting cbg-- 105)  ? Numbness of right thumb   ? Wears glasses   ? ? ?Past Surgical History:  ?Procedure Laterality Date  ? BREAST LUMPECTOMY WITH RADIOACTIVE SEED AND SENTINEL LYMPH NODE BIOPSY Left 06/09/2017  ? Procedure: BREAST LUMPECTOMY WITH RADIOACTIVE SEED AND SENTINEL LYMPH NODE BIOPSY;  Surgeon: Excell Seltzer, MD;  Location: [place];  Service: General;  Laterality: Left;  ? COLONOSCOPY  11/02/2017  ? polyps  ? HERNIA REPAIR    ? HIATAL HERNIA REPAIR  09/2019  ? IR RADIOLOGIST EVAL & MGMT  07/09/2021  ? IR RADIOLOGIST EVAL & MGMT  07/24/2021  ? IR RADIOLOGIST EVAL & MGMT  09/30/2021  ? LAPAROSCOPIC ASSISTED VAGINAL HYSTERECTOMY N/A 11/04/2017  ? Procedure: LAPAROSCOPIC ASSISTED VAGINAL HYSTERECTOMY;  Surgeon: [name], MD;  Location: [facility];  Service: Gynecology;  Laterality: N/A;  ? LAPAROSCOPIC BILATERAL SALPINGO OOPHERECTOMY Bilateral 11/04/2017  ? Procedure: LAPAROSCOPIC BILATERAL SALPINGO OOPHORECTOMY;  Surgeon: [name], MD;  Location: [facility];  Service: Gynecology;  Laterality: Bilateral;  ? LAPAROSCOPY N/A 10/09/2019  ? Procedure: LAPAROSCOPY DIAGNOSTIC WITH PERITONEAL BIOPSIES;  Surgeon: [name], MD;  Location: [facility];  Service:  Gynecology;  Laterality: N/A;  ? PORTACATH PLACEMENT Right 06/09/2017  ? Procedure: INSERTION PORT-A-CATH WITH [place];  Surgeon: Excell Seltzer, MD;  Location: [place];  Service: General;  Laterality: Right;  ? PORTACATH PLACEMENT Right 11/13/2019  ? Procedure: INSERTION PORT-A-CATH WITH ULTRASOUND GUIDANCE;  Surgeon: Rolm Bookbinder, MD;  Location: [place];  Service: General;  Laterality: Right;  ? RADIOLOGY WITH ANESTHESIA N/A 08/27/2021  ? Procedure: IR WITH ANESTHESIA MICROWAVE ABLATION OF LIVER;  Surgeon: Aletta Edouard, MD;  Location: [facility];  Service: Radiology;  Laterality: N/A;  ? TUBAL LIGATION  02/02/2002   @[facility]   ? PPTL  ? UPPER GI ENDOSCOPY    ? ? ?Social History:  reports that she has never smoked. She has never used smokeless tobacco. She reports that she does not currently use alcohol. She reports that she does not use drugs. ? ?Allergies: No Known Allergies ? ?Medications Prior to Admission  ?Medication Sig Dispense Refill  ?  Accu-Chek FastClix Lancets MISC Use as instructed. Inject into the skin twice daily 100 each 3  ? atorvastatin (LIPITOR) 40 MG tablet TAKE 1 TABLET BY MOUTH EVERY DAY (Patient taking differently: Take 40 mg by mouth daily.) 30 tablet 0  ? Blood Glucose Calibration (ACCU-CHEK GUIDE CONTROL) LIQD 1 each by In Vitro route once as needed for up to 1 dose. 1 each 0  ? capecitabine (XELODA) 500 MG tablet Take 2 tablets (1,000 mg total) by mouth 2 (two) times daily after a meal. Take for 14 days on and 7 days off, repeat every 21 days. 56 tablet 3  ? CINNAMON PO Take 2,400 mg by mouth every evening. 1200 mg per cap    ? dexamethasone (DECADRON) 4 MG tablet 22m daily for one week then 27mdaily till she sees oncology    ? diclofenac Sodium (VOLTAREN) 1 % GEL Apply 1 application topically daily as needed (pain).    ? famotidine (PEPCID) 40 MG tablet Take 1 tablet (40 mg total) by mouth daily. 90 tablet 1  ? gabapentin (NEURONTIN) 100 MG capsule Take 1  capsule (100 mg total) by mouth 3 (three) times daily.    ? glucose blood (ACCU-CHEK GUIDE) test strip Use as instructed. Check blood glucose by fingerstick twice per day. 100 each 12  ? lidocaine-prilocaine

## 2021-11-27 NOTE — Progress Notes (Signed)
Occupational Therapy Session Note ? ?Patient Details  ?Name: Erin [name] ?MRN: [id number] ?Date of Birth: 12-18-1960 ? ?Today's Date: 11/27/2021 ?OT Individual Time: 2878-6767 ?OT Individual Time Calculation (min): 15 min  and Today's Date: 11/27/2021 ?OT Missed Time: 30 Minutes ?Missed Time Reason: Patient fatigue;Other (comment) (unable to arouse) ? ? ?Short Term Goals: ?Week 1:  OT Short Term Goal 1 (Week 1): Pt will incoportate BUE into ADL task to promote unsupported dynamic sitting balance with min A ?OT Short Term Goal 2 (Week 1): Pt will tolerate sitting EOB for at least 5 mins during ADL task to promote functional OOB tolerance ?OT Short Term Goal 3 (Week 1): Pt will complete squat pivot transfer with mod A to BSC to promote OOB toileting ?OT Short Term Goal 4 (Week 1): Pt will complete 1/3 toileting steps with min verbal cues ? ?Skilled Therapeutic Interventions/Progress Updates:  ?  Pt sleeping in bed upon arrival. Mom and interpreter present. Attempted to arouse pt with multimodal cues but unsuccessful. Mom stated that pt slept most of the night and has been asleep since she arrived. Pt unable to actively participate in therapy.  ? ?Therapy Documentation ?Precautions:  ?Precautions ?Precautions: Fall ?Precaution Comments: ASL interpreter needed; high fall risk ?Restrictions ?Weight Bearing Restrictions: No ?General: ?General ?OT Amount of Missed Time: 30 Minutes ? ?Pain: ?No s/s of pain ? ? ?Therapy/Group: Individual Therapy ? ?Leroy Libman ?11/27/2021, 10:21 AM ?

## 2021-11-27 NOTE — Progress Notes (Signed)
Pharmacy Antibiotic Note ? ?Erin [name] is a 61 y.o. female admitted on 11/27/2021 with sepsis.  Pharmacy has been consulted for Cefepime dosing. ? ?Patient noted to have AKI (baseline SCr ~0.6) and acute leukocytosis up to 20.1 (ANC 18.4) with lactic acidosis (3.5). Patient is currently afebrile.  ? ?Plan: ?Cefepime  2g IV x1 now, then 2g IV every 24 hours.  ?Monitor renal function, culture results, and clinical status.  ? ?Height: '5\' 1"'$  (154.9 cm) ?Weight: 50 kg (110 lb 3.7 oz) ?IBW/kg (Calculated) : 47.8 ? ?Temp (24hrs), Avg:98 ?F (36.7 ?C), Min:97.8 ?F (36.6 ?C), Max:98.4 ?F (36.9 ?C) ? ?Recent Labs  ?Lab 11/25/21 ?1400 11/26/21 ?0435 11/27/21 ?1309  ?WBC 11.6* 12.7* 20.1*  ?CREATININE  --  1.00 1.76*  ?LATICACIDVEN  --   --  3.5*  ?  ?Estimated Creatinine Clearance: 26.3 mL/min (A) (by C-G formula based on SCr of 1.76 mg/dL (H)).   ? ?No Known Allergies ? ?Antimicrobials this admission: ?Cefepime 3/16 >> ?Clarithromycin 3/3 >> 3/14 ?Metronidazole 3/3 >> 3/16 ?Fluconazole 3/14 >> 3/16 ? ?Dose adjustments this admission: ? ? ?Microbiology results: ?3/16 BCx:  ?3/16 MRSA PCR:  ? ?Thank you for allowing pharmacy to be a part of this patient?s care. ? ?Sloan Leiter, PharmD, BCPS, BCCCP ?Clinical Pharmacist ?Please refer to [facility] for [place] numbers ?11/27/2021 4:11 PM ? ?

## 2021-11-27 NOTE — H&P (Addendum)
? ?NAME:  Erin [name], MRN:  [id number], DOB:  03-Jun-1961, LOS: 0 ?ADMISSION DATE:  11/27/2021, CONSULTATION DATE:  11/27/21 ?REFERRING MD:  [place] - PMR, CHIEF COMPLAINT:  AMS  ? 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 She was seen by oncology during that hospitalization with worsening of leptomeningeal carcinomatosis. Not felt to be neurosurgical candidate. It looks like 3/6 there was an discussion with Onc regarding life expectancy without tx and patient wished to "live as long as possible" so was continued on chemo.  ? ?In inpatient rehab, pt has experienced worsening constipation and abdominal pain. She became altered and hypotensive 3/16. Cxr was concerning for possible pneumoperitoneum. Patient is sent for CT H, CT a/p. CCS and PCCM are consulted in this setting  ? ?Pertinent  Medical History  ?Metastatic breast cancer  ? ?Significant Hospital Events: ?Including procedures, antibiotic start and stop dates in addition to other pertinent events   ?3/16 PCCM consulted to evaluate pt in CIR with AMS, hypotension, poor PO intake  ? ?Interim History / Subjective:  ? Started IVF  ?Returned from CT  ? ?Objective   ?Blood pressure (!) 108/96, pulse (!) 132, resp. rate (!) 22, last menstrual period 07/09/2017, SpO2 95 %. ?   ?   ?No intake or output data in the 24 hours ending 11/27/21 1551 ? ?There were no vitals filed for this visit. ? ? ?Examination: ?General: Chronically and acutely ill appearing middle aged F ?HENT: NCAT. Tacky mm. Anicteric sclera ?Lungs: even and unlabored.  ?Cardiovascular: tachycardic, regular. Cap refill < 3sec ?Abdomen: thin, soft  ?Extremities: no acute joint deformity. No cyanosis or clubbing  ?Neuro: Opens eyes spontaneously,  drowsy. 52m PERRL ?GU: Distended bladder.  ? ?Resolved Hospital Problem list   ? ? ?Assessment & Plan:  ? ?**Note patient is deaf and uses ASL** ? ?Acute metabolic encephalopathy  ?-multifactorial. AKI, dehydration, possible sepsis, hypotension, hyperglycemia.  ?-CT H 3/16 without acute finding, but with hypodensity in cerebellar vermis, L>R hemispheres  ?P ?-IVF ?-supportive care  ?-NPO ?-will admit to ICU and monitor airway protection closely ? ?Metastatic breast cancer with leptomeningeal spread, bone mets, liver mets ?-per family, has not been able to take PO chemo meds the past several days with worsening PO intake ?P ?-will continue GRodantheconversations in ICU  ?-decadron, home xeloda tukysa -- with current encephalopathy is unable to take these PO meds ? ?Leukocytosis ?-PCT 0.26. ?dehydration / reactive vs infectious ?P ?-trend fever curve WBC  ?-will cx and add empiric abx ? ?Hypotension ?-suspect hypovolemic. Possible septic ?P ?-IVF  ? ?Lactic acidosis  ?AGMA ?P ?-IVF, supportive care ? ?AKI ?AGMA ?Hyperkalemia ?Urinary retention / concern for bladder outlet obstruction /Mild hydronephrosis ?P ?-Foley catheter ?-IVF ?-bicarb ?-trend renal function  ?-EKG  ? ?Abdominal pain ?Constipation ?-CT a/p without free air  ?P ?-hopefully foley to decompress bladder will alleviate some abd pain ?-PR bowel reg as pt is unable to take POs ? ?Hyponatremia ?P ?-IVF ?-trend BMP ? ?Hyperglycemia ?P ?-IVF  ?-recheck POCG with IVF -- will recheck before giving insulin  ? ?Failure to thrive in adult ?-progressively poor PO intake, worsening functional status in setting of metastatic dz ?P ?-palliative care consult  ? ?Goals of Care ?-AD document indicates that pt would not want prolonging measures if she  has a condition that cannot be cured and would result in death within a short period of time, became unconscious and felt she would not regain consciousness, or suffered from adv dementia / similar which causes inability to  think and is felt unable to improve  ?-Is full code. ?-It does not sound like goals of care beyond this have been discussed and family has never discussed measures such as code status  ? ?Best Practice (right click and "Reselect all SmartList Selections" daily)  ? ?Diet/type: NPO ?DVT prophylaxis: LMWH ?GI prophylaxis: N/A ?Lines: Central line ?Foley:  Yes, and it is still needed ?Code Status:  full code ?Last date of multidisciplinary goals of care discussion [3/16 -- discussed with patients sisters at bedside. It does not sounds like family has discussed [place] with patient before and are very shocked by acute decompensation as well as possibility that this underlying metastatic cancer may not be fixable] ? ?Labs   ?CBC: ?Recent Labs  ?Lab 11/25/21 ?1400 11/26/21 ?0435 11/27/21 ?1309  ?WBC 11.6* 12.7* 20.1*  ?NEUTROABS  --  10.9* 18.4*  ?HGB 13.1 12.9 14.2  ?HCT 39.0 38.2 42.2  ?MCV 97.3 98.2 98.4  ?PLT 128* 120* 126*  ? ? ?Basic Metabolic Panel: ?Recent Labs  ?Lab 11/26/21 ?0435 11/27/21 ?1309  ?NA 130* 131*  ?K 5.1 6.1*  ?CL 99 99  ?CO2 20* 14*  ?GLUCOSE 398* 667*  ?BUN 30* 73*  ?CREATININE 1.00 1.76*  ?CALCIUM 7.7* 7.5*  ? ?GFR: ?Estimated Creatinine Clearance: 26.3 mL/min (A) (by C-G formula based on SCr of 1.76 mg/dL (H)). ?Recent Labs  ?Lab 11/25/21 ?1400 11/26/21 ?0435 11/27/21 ?1309  ?PROCALCITON  --   --  0.26  ?WBC 11.6* 12.7* 20.1*  ?LATICACIDVEN  --   --  3.5*  ? ? ?Liver Function Tests: ?Recent Labs  ?Lab 11/26/21 ?0435  ?AST 25  ?ALT 54*  ?ALKPHOS 87  ?BILITOT 1.0  ?PROT 6.2*  ?ALBUMIN 3.3*  ? ?No results for input(s): LIPASE, AMYLASE in the last 168 hours. ?No results for input(s): AMMONIA in the last 168 hours. ? ?ABG ?No results found for: PHART, PCO2ART, PO2ART, HCO3, TCO2, ACIDBASEDEF, O2SAT  ? ?Coagulation Profile: ?No results for input(s): INR, PROTIME in the last 168 hours. ? ?Cardiac Enzymes: ?No results for input(s): CKTOTAL, CKMB, CKMBINDEX, TROPONINI in the last 168 hours. ? ?HbA1C: ?HbA1c,  POC (controlled diabetic range)  ?Date/Time Value Ref Range Status  ?04/04/2021 10:01 AM 5.7 0.0 - 7.0 % Final  ? ?Hgb A1c MFr Bld  ?Date/Time Value Ref Range Status  ?08/22/2021 03:25 PM 6.5 (H) 4.8 - 5.6 % Final  ?  Comment:  ?  (NOTE) ?Pre diabetes:          5.7%-6.4% ? ?Diabetes:              >6.4% ? ?Glycemic control for   <7.0% ?adults with diabetes ?  ?11/19/2020 10:01 AM 5.2 4.8 - 5.6 % Final  ?  Comment:  ?           Prediabetes: 5.7 - 6.4 ?         Diabetes: >6.4 ?         Glycemic control for adults with diabetes: <7.0 ?  ? ? ?CBG: ?Recent Labs  ?Lab 11/27/21 ?1435 11/27/21 ?1536  ?GLUCAP 496* 519*  ? ? ?Review of Systems:   ?Unable to obtain due to encephalopathy  ? ?Past Medical History:  ?She,  has a past medical history of Arthritis, Borderline glaucoma  of right eye, BRCA2 gene mutation positive (02/17/2018), Breast cancer ([place]) (05/2017), Chronic back pain, Deaf, Depression, Elevated cancer antigen 125 (CA 125), Family history of breast cancer, Family history of breast cancer, Family history of colon cancer, Genetic testing (02/17/2018), GERD (gastroesophageal reflux disease), History of cancer chemotherapy, 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 (oncologist-  dr [name]), History of external beam radiation therapy, Hyperlipidemia, Hypertension, IDA (iron deficiency anemia), Malignant neoplasm of lower-outer quadrant of left breast of female, estrogen receptor negative ([place]) (oncologist-- dr [name]), Non-insulin dependent type 2 diabetes mellitus ([place]), Numbness of right thumb, and Wears glasses.  ? ?Surgical History:  ? ?Past Surgical History:  ?Procedure Laterality Date  ? BREAST LUMPECTOMY WITH RADIOACTIVE SEED AND SENTINEL LYMPH NODE BIOPSY Left 06/09/2017  ? Procedure: BREAST LUMPECTOMY WITH RADIOACTIVE SEED AND SENTINEL LYMPH NODE BIOPSY;  Surgeon: Excell Seltzer, MD;  Location: [place];  Service: General;  Laterality: Left;  ? COLONOSCOPY  11/02/2017  ? polyps  ?  HERNIA REPAIR    ? HIATAL HERNIA REPAIR  09/2019  ? IR RADIOLOGIST EVAL & MGMT  07/09/2021  ? IR RADIOLOGIST EVAL & MGMT  07/24/2021  ? IR RADIOLOGIST EVAL & MGMT  09/30/2021  ? LAPAROSCOPIC ASSISTED VAGINAL H

## 2021-11-27 NOTE — Progress Notes (Signed)
Occupational Therapy Note ? ?Patient Details  ?Name: Erin [name] ?MRN: [id number] ?Date of Birth: [birth year] ? ?Occupational Therapy Discharge Note ? ?This patient was unable to complete the inpatient rehab program due to unplanned discharge due to increased acute medical needs requiring transfer to ICU; therefore did not meet their long term goals. Pt left the program at a Max-total assist level for their functional ADLs. This patient is being discharged from OT services at this time. ? ?BIMS at time of d/c  ?Pt unable to complete due to medical status ? ?See CareTool for functional status details. ? ?If the patient is able to return to inpatient rehabilitation within 3 midnights, this may be considered an interrupted stay and therapy services will resume as ordered. Modification and reinstatement of their goals will be made upon completion of therapy service reevaluations.  ?  ? ? ? ?[place] ?11/27/2021, 4:04 PM ?

## 2021-11-27 NOTE — Progress Notes (Signed)
Rapid response nurse called because of increased lethargy and xray findings. Escorted to CT, medical team at bedside and plans to transfer to ICU in place.  ?

## 2021-11-27 NOTE — Progress Notes (Addendum)
?                                                       PROGRESS NOTE ? ? ?Subjective/Complaints: ? ?Pt reports still having awful reflux- ?Also not eating or drinking per pt's mother who's at bedside.  ?Since Sunday not really eating.  ?So nauseated and has bad reflux and hard ot swallow- sounds like form thrush.  ?Also c/o dizzy and sleepy- thinks form meds.  ? ? ?  ? ?ROS: ? ?Pt denies SOB, (+) abd pain, CP, (+)N/V/C/D, and vision changes ? ? ? ?Objective: ?  ?VAS [place] LOWER EXTREMITY VENOUS (DVT) ? ?Result Date: 11/25/2021 ? Lower Venous DVT Study Patient Name:  Erin [name]  Date of Exam:   11/25/2021 Medical Rec #: [id number]       Accession #:    [id number] Date of Birth: 07/20/1961      Patient Gender: F Patient Age:   61 years Exam Location:  [facility] Procedure:      VAS [place] LOWER EXTREMITY VENOUS (DVT) Referring Phys: Lauraine Rinne --------------------------------------------------------------------------------  Indications: Edema.  Limitations: Poor ultrasound/tissue interface. Comparison Study: No prior study Performing Technologist: Maudry Mayhew MHA, RDMS, RVT, RDCS  Examination Guidelines: A complete evaluation includes B-mode imaging, spectral Doppler, color Doppler, and power Doppler as needed of all accessible portions of each vessel. Bilateral testing is considered an integral part of a complete examination. Limited examinations for reoccurring indications may be performed as noted. The reflux portion of the exam is performed with the patient in reverse Trendelenburg.  +---------+---------------+---------+-----------+----------+--------------+ RIGHT    CompressibilityPhasicitySpontaneityPropertiesThrombus Aging +---------+---------------+---------+-----------+----------+--------------+ CFV      Full           Yes      Yes                                 +---------+---------------+---------+-----------+----------+--------------+ SFJ      Full                                                         +---------+---------------+---------+-----------+----------+--------------+ FV Prox  Full                                                        +---------+---------------+---------+-----------+----------+--------------+ FV Mid   Full                                                        +---------+---------------+---------+-----------+----------+--------------+ FV DistalFull                                                        +---------+---------------+---------+-----------+----------+--------------+  PFV      Full                                                        +---------+---------------+---------+-----------+----------+--------------+ POP      Full           Yes      Yes                                 +---------+---------------+---------+-----------+----------+--------------+ PTV      Full                                                        +---------+---------------+---------+-----------+----------+--------------+ PERO     Full                                                        +---------+---------------+---------+-----------+----------+--------------+   +---------+---------------+---------+-----------+----------+--------------+ LEFT     CompressibilityPhasicitySpontaneityPropertiesThrombus Aging +---------+---------------+---------+-----------+----------+--------------+ CFV      Full           Yes      Yes                                 +---------+---------------+---------+-----------+----------+--------------+ SFJ      Full                                                        +---------+---------------+---------+-----------+----------+--------------+ FV Prox  Full                                                        +---------+---------------+---------+-----------+----------+--------------+ FV Mid   Full                                                         +---------+---------------+---------+-----------+----------+--------------+ FV DistalFull                                                        +---------+---------------+---------+-----------+----------+--------------+ PFV      Full                                                        +---------+---------------+---------+-----------+----------+--------------+  POP      Full           Yes      Yes                                 +---------+---------------+---------+-----------+----------+--------------+ PTV      Full                                                        +---------+---------------+---------+-----------+----------+--------------+ PERO     Full                                                        +---------+---------------+---------+-----------+----------+--------------+     Summary: RIGHT: - There is no evidence of deep vein thrombosis in the lower extremity.  - No cystic structure found in the popliteal fossa.  LEFT: - There is no evidence of deep vein thrombosis in the lower extremity.  - No cystic structure found in the popliteal fossa.  *See table(s) above for measurements and observations. Electronically signed by Servando Snare MD on 11/25/2021 at 8:32:00 PM.    Final    ?Recent Labs  ?  11/25/21 ?1400 11/26/21 ?0435  ?WBC 11.6* 12.7*  ?HGB 13.1 12.9  ?HCT 39.0 38.2  ?PLT 128* 120*  ? ?Recent Labs  ?  11/26/21 ?0435  ?NA 130*  ?K 5.1  ?CL 99  ?CO2 20*  ?GLUCOSE 398*  ?BUN 30*  ?CREATININE 1.00  ?CALCIUM 7.7*  ? ? ?Intake/Output Summary (Last 24 hours) at 11/27/2021 0849 ?Last data filed at 11/26/2021 2100 ?Gross per 24 hour  ?Intake 230 ml  ?Output --  ?Net 230 ml  ?  ? ?  ? ?Physical Exam: ?Vital Signs ?Blood pressure 113/75, pulse (!) 106, temperature 97.8 ?F (36.6 ?C), resp. rate 19, height '5\' 1"'$  (1.549 m), weight 52.4 kg, last menstrual period 07/09/2017, SpO2 99 %. ? ? ? ? ?General: awake, but very sleepy- mother at bedside; intermittent nausea/retching;    NAD ?HENT: conjugate gaze; oropharynx dry- still has trhush ?CV: regular rhythm; tachycardic rate; no JVD ?Pulmonary: CTA B/L; no W/R/R- good air movement ?GI: soft, hypoactive; mild TTP diffusely- no rebound; ND  ?Psychiatric: appropriate- wan; signing some, but not a lot.  ?Neurological: sleepy/lethargic ? ?Musculoskeletal:  ?   Cervical back: Neck supple. No tenderness.  ?   Comments: UE strength 5-/5 in Biceps, triceps, grip and finger abd B/L  ?Les- Proximal HF/KE/KF 2-/5; but DF/PF 4-/5 B/L - worse weakness on RLE  ?Skin: ?   General: Skin is warm and dry.  ?   Comments: Small skin tear on coccyx- pink- superficial 1x 0.5 cm  ?Neurological:  ?   Mental Status: She is oriented to person, place, and time.  ?   Comments: Patient is alert sitting up in bed.  Makes eye contact with examiner.  She is deaf and will read lips as well as use sign language. ?Decreased to light touch in LE's B/L from L1-S5  ? ? ?Assessment/Plan: ?1. Functional deficits which require 3+ hours per day of interdisciplinary therapy in a comprehensive inpatient  rehab setting. ?Physiatrist is providing close team supervision and 24 hour management of active medical problems listed below. ?Physiatrist and rehab team continue to assess barriers to discharge/monitor patient progress toward functional and medical goals ? ?Care Tool: ? ?Bathing ?   ?Body parts bathed by patient: Right arm, Left arm, Chest, Abdomen, Right upper leg, Left upper leg, Right lower leg, Left lower leg  ? Body parts bathed by helper: Right lower leg, Left lower leg, Buttocks, Front perineal area ?  ?  ?Bathing assist Assist Level: Maximal Assistance - Patient 24 - 49% ?  ?  ?Upper Body Dressing/Undressing ?Upper body dressing   ?What is the patient wearing?: Pull over shirt ?   ?Upper body assist Assist Level: Minimal Assistance - Patient > 75% ?   ?Lower Body Dressing/Undressing ?Lower body dressing ? ? ?   ?What is the patient wearing?: Incontinence brief ? ?  ? ?Lower  body assist Assist for lower body dressing: Total Assistance - Patient < 25% ?   ? ?Toileting ?Toileting    ?Toileting assist Assist for toileting: Total Assistance - Patient < 25% ?  ?  ?Transfers ?Chair/bed transfer ? ?Transfers assist ?   ? ?Chair/bed transfer assist level: Total Assistance - Patient < 25% ?  ?  ?Locomotion ?Ambulation ? ? ?Ambul

## 2021-11-27 NOTE — Care Management (Signed)
Inpatient Rehabilitation Center ?Individual Statement of Services ? ?Patient Name:  Erin [name]  ?Date:  11/27/2021 ? ?Welcome to the [place].  Our goal is to provide you with an individualized program based on your diagnosis and situation, designed to meet your specific needs.  With this comprehensive rehabilitation program, you will be expected to participate in at least 3 hours of rehabilitation therapies Monday-Friday, with modified therapy programming on the weekends. ? ?Your rehabilitation program will include the following services:  Physical Therapy (PT), Occupational Therapy (OT), 24 hour per day rehabilitation nursing, Therapeutic Recreaction (TR), Psychology, Neuropsychology, Care Coordinator, Rehabilitation Medicine, Nutrition Services, Pharmacy Services, and Other ? ?Weekly team conferences will be held on Tuesdays to discuss your progress.  Your Inpatient Rehabilitation Care Coordinator will talk with you frequently to get your input and to update you on team discussions.  Team conferences with you and your family in attendance may also be held. ? ?Expected length of stay: 18-21 days   ? ?Overall anticipated outcome: Minimal Assistance ? ?Depending on your progress and recovery, your program may change. Your Inpatient Rehabilitation Care Coordinator will coordinate services and will keep you informed of any changes. Your Inpatient Rehabilitation Care Coordinator's name and contact numbers are listed  below. ? ?The following services may also be recommended but are not provided by the [facility] [place]:  ?Driving Evaluations ?Home Health Rehabiltiation Services ?Outpatient Rehabilitation Services ?Vocational Rehabilitation ?  ?Arrangements will be made to provide these services after discharge if needed.  Arrangements include referral to agencies that provide these services. ? ?Your insurance has been verified to be:  Medicare Part A ? ?Your primary doctor is:   Geryl Rankins ? ?Pertinent information will be shared with your doctor and your insurance company. ? ?Inpatient Rehabilitation Care Coordinator:  Cathleen Corti [phone] or (C) [phone] ? ?Information discussed with and copy given to patient by: Rana Snare, 11/27/2021, 12:16 PM    ?

## 2021-11-27 NOTE — Progress Notes (Signed)
?   11/27/21 1340  ?Assess: MEWS Score  ?Temp 98.4 ?F (36.9 ?C)  ?BP (!) 80/62  ?Pulse Rate (!) 131  ?SpO2 97 %  ?O2 Device Room Air  ?Assess: MEWS Score  ?MEWS Temp 0  ?MEWS Systolic 2  ?MEWS Pulse 3  ?MEWS RR 0  ?MEWS LOC 0  ?MEWS Score 5  ?MEWS Score Color Red  ?Assess: if the MEWS score is Yellow or Red  ?Were vital signs taken at a resting state? Yes  ?Focused Assessment Change from prior assessment (see assessment flowsheet)  ?Early Detection of Sepsis Score *See Row Information* Medium  ?MEWS guidelines implemented *See Row Information* Yes  ?Notify: Charge Nurse/RN  ?Name of Charge Nurse/RN Notified Hillary, RN  ?Date Charge Nurse/RN Notified 11/27/21  ?Time Charge Nurse/RN Notified 1340  ?Notify: Rapid Response  ?Name of Rapid Response RN Notified Kennis Carina ?(at bedside)  ?Date Rapid Response Notified 11/27/21  ?Time Rapid Response Notified 1340  ? ? ?

## 2021-11-27 NOTE — Progress Notes (Addendum)
Follow-up chest x-ray: ? ?IMPRESSION: ?1. Unchanged horizontal bandlike mild scarring overlying the mid to ?lower right lung. No acute lung process. ?2. Air just deep to the left hemidiaphragm is favored to be within ?patient's stomach. If there is clinical concern for ?pneumoperitoneum, consider abdominal radiographs including ?right-side-up decubitus view. ?  ?  ?Electronically Signed ?  By: Yvonne Kendall M.D. ?  On: 11/27/2021 09:39 ? ?CBC and BMP not yet drawn due to Cathflo indwelling in patient's Port-A-Cath.  On exam, she remains lethargic and is tender to palpation on abdominal exam.  Reordered lab draw stat from peripheral vein.  Add lactic acid.  Consulted general surgery: Discussed case with on-call PA.  Advised to obtain CT abdomen pelvis with IV contrast.  Order placed.  Discussed with Dr. Dagoberto Ligas. ?

## 2021-11-27 NOTE — Progress Notes (Signed)
Pt has home medications stored in pharmacy. ?

## 2021-11-27 NOTE — Progress Notes (Signed)
Physical Therapy Session Note ? ?Patient Details  ?Name: Erin [name] ?MRN: [id number] ?Date of Birth: 1960/11/04 ? ?Today's Date: 11/27/2021 ?PT Individual Time: 4619-0122 ?PT Individual Time Calculation (min): 25 min  ? ?Short Term Goals: ?Week 1:  PT Short Term Goal 1 (Week 1): Pt will perform bed mobility with mod A consistently ?PT Short Term Goal 2 (Week 1): Pt will perform least restrictive transfer with max A x 1 consistently ?PT Short Term Goal 3 (Week 1): Pt will initiate w/c mobility ?PT Short Term Goal 4 (Week 1): Pt will maintain sitting balance x 5 min with min A ? ?Skilled Therapeutic Interventions/Progress Updates: Pt presented in bed sleeping but required increased noxious stimuli for arousal. Pt was unable to maintain arousal throughout session. Pt discussed with mom when present performing turning pt every 2-3 hours while in room for pressure relief (also discussed with nsg after session). As PTA initiated rolling (pt was able to arouse enough to initiate and reach for bed rail) noted that pt was incontinent of bladder. Pt was able to roll L/R with modA as PTA performed peri-care and changed brief. PTA was also able to arouse pt enough to perform gentle oral care. Once completed PTA placed pillows under pt for R bias and left pt sleeping with bed alarm on, call bell within reach and needs met.  ?   ? ?Therapy Documentation ?Precautions:  ?Precautions ?Precautions: Fall ?Precaution Comments: ASL interpreter needed; high fall risk ?Restrictions ?Weight Bearing Restrictions: No ?General: ?PT Amount of Missed Time (min): 20 Minutes ?PT Missed Treatment Reason: Patient fatigue ? ? ? ?Therapy/Group: Individual Therapy ? ?Lindsie Simar ?11/27/2021, 1:22 PM  ?

## 2021-11-27 NOTE — Progress Notes (Signed)
PCCM Interval progress Note ? ?61 yo F PMH metastatic breast cx admitted to ICU 3/16 from CIR with AMS, AKI, Hyperkalemia, Urinary retention, hypotension, dehydration, hyperglycemia. ? ?Repeat CMP with some interval improvement in hyperkalemia, renal function. Persistent acidosis, hyperglycemia. Slight incr in LA. ? ?With IVF, is more awake this afternoon compared to time of consult but still very lethargic. ? ?K 5.5 BUN 67 Cr 1.59  ?CO2 13 AG 18 ?Glu 569 ?LA 3.8 ? ?Acute metabolic encephalopathy ?AKI with uremia ?Urinary retention / bladder outlet obstruction / mild hydronephrosis  ?Hyperglycemia ?Hyperkalemia ?AGMA ?Lactic acidosis ?P ?-start insulin gtt -- follow endotool  ?-cont IVF ?-add on mag ?-2200 BMP  ?-follow up on beta hydroxybutyrate ? ? ?Additional critical care time: ?32 minutes  ? ?Erin Gum MSN, AGACNP-BC ?[facility] ?Amion for pager  ?11/27/2021, 5:25 PM ? ? ? ?

## 2021-11-27 NOTE — Progress Notes (Signed)
Report given to April, RN. Patient discharged off of the unit due to ongoing medical issues and needing higher level of care. ?

## 2021-11-27 NOTE — H&P (Deleted)
? ?NAME:  Erin [name], MRN:  [id number], DOB:  Feb 02, 1961, LOS: 2 ?ADMISSION DATE:  11/25/2021, CONSULTATION DATE:  11/27/21 ?REFERRING MD:  [place] - PMR, CHIEF COMPLAINT:  AMS  ? 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 She was seen by oncology during that hospitalization with worsening of leptomeningeal carcinomatosis. Not felt to be neurosurgical candidate. It looks like 3/6 there was an discussion with Onc regarding life expectancy without tx and patient wished to "live as long as possible" so was continued on chemo.  ? ?In inpatient rehab, pt has experienced worsening constipation and abdominal pain. She became altered and hypotensive 3/16. Cxr was concerning for possible pneumoperitoneum. Patient is sent for CT H, CT a/p. CCS and PCCM are consulted in this setting  ? ?Pertinent  Medical History  ?Metastatic breast cancer  ? ?Significant Hospital Events: ?Including procedures, antibiotic start and stop dates in addition to other pertinent events   ?3/16 PCCM consulted to evaluate pt in CIR with AMS, hypotension, poor PO intake  ? ?Interim History / Subjective:  ? Started IVF  ?Returned from CT  ? ?Objective   ?Blood pressure (!) 80/62, pulse (!) 131, temperature 98.4 ?F (36.9 ?C), temperature source Oral, resp. rate 19, height 5' 1"  (1.549 m), weight 52.4 kg, last menstrual period 07/09/2017, SpO2 97 %. ?   ?   ? ?Intake/Output Summary (Last 24 hours) at 11/27/2021 1404 ?Last data filed at 11/27/2021 0700 ?Gross per 24 hour  ?Intake 180 ml  ?Output --  ?Net 180 ml  ? ?Filed Weights  ? 11/25/21 1225  ?Weight: 52.4 kg  ? ? ?Examination: ?General: Chronically and acutely ill appearing middle aged F ?HENT: NCAT. Tacky mm. Anicteric sclera ?Lungs: even and  unlabored.  ?Cardiovascular: tachycardic, regular. Cap refill < 3sec ?Abdomen: thin, soft  ?Extremities: no acute joint deformity. No cyanosis or clubbing  ?Neuro: Opens eyes spontaneously, drowsy. 73m PERRL ?GU: Distended bladder.  ? ?Resolved Hospital Problem list   ? ? ?Assessment & Plan:  ? ?**Note patient is deaf and uses ASL** ? ?Acute metabolic encephalopathy  ?-multifactorial. AKI, dehydration, possible sepsis, hypotension, hyperglycemia.  ?-CT H 3/16 without acute finding, but with hypodensity in cerebellar vermis, L>R hemispheres  ?P ?-IVF ?-supportive care  ?-NPO ?-will admit to ICU and monitor airway protection closely ? ?Metastatic breast cancer with leptomeningeal spread, bone mets, liver mets ?-per family, has not been able to take PO chemo meds the past several days with worsening PO intake ?P ?-[place]  ?-decadron, home xeloda tukysa -- with current encephalopathy is unable to take these PO meds ? ?Leukocytosis ?-PCT 0.26. ?dehydration / reactive vs infectious ?P ?-trend fever curve WBC  ? ?Hypotension ?-suspect hypovolemic. Possible septic ?P ?-IVF  ? ?Lactic acidosis  ?AGMA ?P ?-IVF, supportive care ? ?AKI ?AGMA ?Hyperkalemia ?Urinary retention / concern for bladder outlet obstruction /Mild hydronephrosis ?P ?-Foley catheter ?-IVF ?-bicarb ?-trend renal function  ?-EKG  ? ?Abdominal pain ?Constipation ?-CT a/p without free air  ?P ?-hopefully foley to decompress bladder will alleviate some abd pain ?-PR bowel reg as pt is unable to take POs ? ?Hyponatremia ?P ?-IVF ?-trend BMP ? ?Hyperglycemia ?P ?-IVF  ?-recheck POCG -- will recheck before giving insulin  ? ?Failure to thrive in adult ?-progressively poor PO intake,  worsening functional status in setting of metastatic dz ?P ?-palliative care consult  ? ?Goals of Care ?-AD document indicates that pt would not want prolonging measures if she has a condition that cannot be cured and would result in death within a short  period of time, became unconscious and felt she would not regain consciousness, or suffered from adv dementia / similar which causes inability to think and is felt unable to improve  ?-Is full code. ?-It does not sound like goals of care beyond this have been discussed and family has never discussed measures such as code status  ? ?Best Practice (right click and "Reselect all SmartList Selections" daily)  ? ?Diet/type: NPO ?DVT prophylaxis: LMWH ?GI prophylaxis: N/A ?Lines: Central line ?Foley:  Yes, and it is still needed ?Code Status:  full code ?Last date of multidisciplinary goals of care discussion [3/16 -- discussed with patients sisters at bedside. It does not sounds like family has discussed [place] with patient before and are very shocked by acute decompensation as well as possibility that this underlying metastatic cancer may not be fixable] ? ?Labs   ?CBC: ?Recent Labs  ?Lab 11/25/21 ?1400 11/26/21 ?0435 11/27/21 ?1309  ?WBC 11.6* 12.7* 20.1*  ?NEUTROABS  --  10.9* 18.4*  ?HGB 13.1 12.9 14.2  ?HCT 39.0 38.2 42.2  ?MCV 97.3 98.2 98.4  ?PLT 128* 120* 126*  ? ? ?Basic Metabolic Panel: ?Recent Labs  ?Lab 11/26/21 ?0435  ?NA 130*  ?K 5.1  ?CL 99  ?CO2 20*  ?GLUCOSE 398*  ?BUN 30*  ?CREATININE 1.00  ?CALCIUM 7.7*  ? ?GFR: ?Estimated Creatinine Clearance: 46.3 mL/min (by C-G formula based on SCr of 1 mg/dL). ?Recent Labs  ?Lab 11/25/21 ?1400 11/26/21 ?0435 11/27/21 ?1309  ?WBC 11.6* 12.7* 20.1*  ? ? ?Liver Function Tests: ?Recent Labs  ?Lab 11/26/21 ?0435  ?AST 25  ?ALT 54*  ?ALKPHOS 87  ?BILITOT 1.0  ?PROT 6.2*  ?ALBUMIN 3.3*  ? ?No results for input(s): LIPASE, AMYLASE in the last 168 hours. ?No results for input(s): AMMONIA in the last 168 hours. ? ?ABG ?No results found for: PHART, PCO2ART, PO2ART, HCO3, TCO2, ACIDBASEDEF, O2SAT  ? ?Coagulation Profile: ?No results for input(s): INR, PROTIME in the last 168 hours. ? ?Cardiac Enzymes: ?No results for input(s): CKTOTAL, CKMB, CKMBINDEX, TROPONINI in the last 168  hours. ? ?HbA1C: ?HbA1c, POC (controlled diabetic range)  ?Date/Time Value Ref Range Status  ?04/04/2021 10:01 AM 5.7 0.0 - 7.0 % Final  ? ?Hgb A1c MFr Bld  ?Date/Time Value Ref Range Status  ?08/22/2021 03:25 PM 6.5 (H) 4.8 - 5.6 % Final  ?  Comment:  ?  (NOTE) ?Pre diabetes:          5.7%-6.4% ? ?Diabetes:              >6.4% ? ?Glycemic control for   <7.0% ?adults with diabetes ?  ?11/19/2020 10:01 AM 5.2 4.8 - 5.6 % Final  ?  Comment:  ?           Prediabetes: 5.7 - 6.4 ?         Diabetes: >6.4 ?         Glycemic control for adults with diabetes: <7.0 ?  ? ? ?CBG: ?No results for input(s): GLUCAP in the last 168 hours. ? ?Review of Systems:   ?Unable to obtain due to encephalopathy  ? 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  ? ?Surgical History:  ? ?Past Surgical History:  ?Procedure Laterality Date  ? BREAST LUMPECTOMY WITH RADIOACTIVE SEED AND SENTINEL LYMPH NODE BIOPSY Left 06/09/2017  ? Procedure: BREAST LUMPECTOMY WITH RADIOACTIVE SEED AND SENTINEL LYMPH NODE BIOPSY;  Surgeon: Excell Seltzer, MD;  Location: [facility];  Service: General;  Laterality: Left;  ? COLONOSCOPY  11/02/2017  ?  polyps  ? HERNIA REPAIR    ? HIATAL HERNIA REPAIR  09/2019  ? IR RADIOLOGIST EVAL & MGMT  07/09/2021  ? IR RADIOLOGIST EVAL & MGMT  07/24/2021  ? IR RADIOLOGIST EVAL & MGMT  09/30/2021  ? LAPAROSCOPIC ASSI

## 2021-11-27 NOTE — Progress Notes (Deleted)
Speech Language Pathology Note ? ?Patient Details  ?Name: Erin [name] ?MRN: [id number] ?Date of Birth: 1961-09-04 ?Today's Date: 11/27/2021 ? ?SLP acknowledged orders for clinical swallow evaluation due to report of difficulty swallowing pills. SLP evaluation will be deferred at this time, as patient was made NPO as of 1246 for further testing. Confirmed information with PA and notified MD that eval will be completed when clinically appropriate.  ? ?Patty Sermons ?11/27/2021, 1:29 PM  ?

## 2021-11-27 NOTE — Progress Notes (Addendum)
Speech Language Pathology Daily Session Note ? ?Patient Details  ?Name: Erin [name] ?MRN: [id number] ?Date of Birth: [birth year] ? ?Today's Date: 11/27/2021 ?SLP Missed Time: 45 Minutes ?Missed Time Reason: Other (Comment) (Further testing) ? ?SLP acknowledged orders for clinical swallow evaluation due to report of difficulty swallowing pills. SLP evaluation will be deferred at this time, as patient was made NPO as of 1246 for further testing. Confirmed information with PA and notified MD that eval will be completed when clinically appropriate. ? ?Patty Sermons ?11/27/2021, 1:33 PM ?

## 2021-11-28 DIAGNOSIS — G9341 Metabolic encephalopathy: Secondary | ICD-10-CM

## 2021-11-28 LAB — CBC
HCT: 32.6 % — ABNORMAL LOW (ref 36.0–46.0)
Hemoglobin: 10.8 g/dL — ABNORMAL LOW (ref 12.0–15.0)
MCH: 32.7 pg (ref 26.0–34.0)
MCHC: 33.1 g/dL (ref 30.0–36.0)
MCV: 98.8 fL (ref 80.0–100.0)
Platelets: 90 10*3/uL — ABNORMAL LOW (ref 150–400)
RBC: 3.3 MIL/uL — ABNORMAL LOW (ref 3.87–5.11)
RDW: 16.8 % — ABNORMAL HIGH (ref 11.5–15.5)
WBC: 16.4 10*3/uL — ABNORMAL HIGH (ref 4.0–10.5)
nRBC: 0.3 % — ABNORMAL HIGH (ref 0.0–0.2)

## 2021-11-28 LAB — GLUCOSE, CAPILLARY
Glucose-Capillary: 201 mg/dL — ABNORMAL HIGH (ref 70–99)
Glucose-Capillary: 238 mg/dL — ABNORMAL HIGH (ref 70–99)
Glucose-Capillary: 198 mg/dL — ABNORMAL HIGH (ref 70–99)
Glucose-Capillary: 155 mg/dL — ABNORMAL HIGH (ref 70–99)
Glucose-Capillary: 153 mg/dL — ABNORMAL HIGH (ref 70–99)
Glucose-Capillary: 134 mg/dL — ABNORMAL HIGH (ref 70–99)
Glucose-Capillary: 134 mg/dL — ABNORMAL HIGH (ref 70–99)
Glucose-Capillary: 146 mg/dL — ABNORMAL HIGH (ref 70–99)
Glucose-Capillary: 161 mg/dL — ABNORMAL HIGH (ref 70–99)
Glucose-Capillary: 238 mg/dL — ABNORMAL HIGH (ref 70–99)
Glucose-Capillary: 202 mg/dL — ABNORMAL HIGH (ref 70–99)
Glucose-Capillary: 261 mg/dL — ABNORMAL HIGH (ref 70–99)

## 2021-11-28 LAB — BASIC METABOLIC PANEL
Anion gap: 10 (ref 5–15)
BUN: 32 mg/dL — ABNORMAL HIGH (ref 6–20)
CO2: 23 mmol/L (ref 22–32)
Calcium: 7.2 mg/dL — ABNORMAL LOW (ref 8.9–10.3)
Chloride: 112 mmol/L — ABNORMAL HIGH (ref 98–111)
Creatinine, Ser: 0.77 mg/dL (ref 0.44–1.00)
GFR, Estimated: >60 mL/min (ref >60)
Glucose, Bld: 231 mg/dL — ABNORMAL HIGH (ref 70–99)
Potassium: 3.7 mmol/L (ref 3.5–5.1)
Sodium: 145 mmol/L (ref 135–145)

## 2021-11-28 MED ORDER — BISACODYL 10 MG RE SUPP
10.0000 mg | Freq: Once | RECTAL | Status: AC
  Administered 2021-11-28: 10 mg via RECTAL
  Filled 2021-11-28: qty 1

## 2021-11-28 MED ORDER — INSULIN ASPART 100 UNIT/ML IJ SOLN
1.0000 [IU] | INTRAMUSCULAR | Status: DC
  Administered 2021-11-28: 2 [IU] via SUBCUTANEOUS

## 2021-11-28 MED ORDER — BETHANECHOL CHLORIDE 10 MG PO TABS
10.0000 mg | ORAL_TABLET | Freq: Three times a day (TID) | ORAL | Status: DC
  Administered 2021-11-28 – 2021-11-30 (×6): 10 mg via ORAL
  Filled 2021-11-28 (×9): qty 1

## 2021-11-28 MED ORDER — INSULIN ASPART 100 UNIT/ML IJ SOLN
0.0000 [IU] | Freq: Three times a day (TID) | INTRAMUSCULAR | Status: DC
  Administered 2021-11-28: 8 [IU] via SUBCUTANEOUS
  Administered 2021-11-29: 5 [IU] via SUBCUTANEOUS
  Administered 2021-11-29 – 2021-11-30 (×4): 3 [IU] via SUBCUTANEOUS

## 2021-11-28 MED ORDER — POLYETHYLENE GLYCOL 3350 17 G PO PACK
17.0000 g | PACK | Freq: Two times a day (BID) | ORAL | Status: DC
  Administered 2021-11-28 – 2021-11-30 (×4): 17 g via ORAL
  Filled 2021-11-28 (×4): qty 1

## 2021-11-28 MED ORDER — SODIUM CHLORIDE 0.9 % IV SOLN
2.0000 g | Freq: Two times a day (BID) | INTRAVENOUS | Status: DC
  Administered 2021-11-28 – 2021-11-30 (×5): 2 g via INTRAVENOUS
  Filled 2021-11-28 (×5): qty 2

## 2021-11-28 MED ORDER — INSULIN DETEMIR 100 UNIT/ML ~~LOC~~ SOLN
15.0000 [IU] | Freq: Two times a day (BID) | SUBCUTANEOUS | Status: DC
  Administered 2021-11-28: 15 [IU] via SUBCUTANEOUS
  Filled 2021-11-28 (×2): qty 0.15

## 2021-11-28 MED ORDER — DEXAMETHASONE 4 MG PO TABS
4.0000 mg | ORAL_TABLET | Freq: Every day | ORAL | Status: DC
  Administered 2021-11-28 – 2021-11-30 (×3): 4 mg via ORAL
  Filled 2021-11-28 (×4): qty 1

## 2021-11-28 MED ORDER — DEXTROSE 10 % IV SOLN: INTRAVENOUS | Status: DC | PRN

## 2021-11-28 MED ORDER — POLYETHYLENE GLYCOL 3350 17 G PO PACK: 17.0000 g | PACK | Freq: Every day | ORAL | Status: DC

## 2021-11-28 MED ORDER — INSULIN ASPART 100 UNIT/ML IJ SOLN: 5.0000 [IU] | INTRAMUSCULAR | Status: DC

## 2021-11-28 MED ORDER — LACTATED RINGERS IV SOLN: INTRAVENOUS | Status: AC

## 2021-11-28 MED ORDER — DEXTROSE IN LACTATED RINGERS 5 % IV SOLN: INTRAVENOUS | Status: DC

## 2021-11-28 MED ORDER — INSULIN ASPART 100 UNIT/ML IJ SOLN
0.0000 [IU] | Freq: Every day | INTRAMUSCULAR | Status: DC
  Administered 2021-11-28: 2 [IU] via SUBCUTANEOUS
  Administered 2021-11-29: 3 [IU] via SUBCUTANEOUS

## 2021-11-28 MED ORDER — LACTATED RINGERS IV SOLN: INTRAVENOUS | Status: DC

## 2021-11-28 MED ORDER — DEXAMETHASONE 4 MG PO TABS: 2.0000 mg | ORAL_TABLET | Freq: Every day | ORAL | Status: DC

## 2021-11-28 MED ORDER — SENNA 8.6 MG PO TABS
2.0000 | ORAL_TABLET | Freq: Two times a day (BID) | ORAL | Status: DC
  Administered 2021-11-28 – 2021-11-30 (×4): 17.2 mg via ORAL
  Filled 2021-11-28 (×5): qty 2

## 2021-11-28 MED ORDER — MENTHOL 3 MG MT LOZG: 1.0000 | LOZENGE | OROMUCOSAL | Status: DC | PRN

## 2021-11-28 NOTE — Progress Notes (Signed)
? ?NAME:  Erin [name], MRN:  [id number], DOB:  [birth year], LOS: 1 ?ADMISSION DATE:  11/27/2021, CONSULTATION DATE:  11/27/21 ?REFERRING MD:  Erin [name] - PMR, CHIEF COMPLAINT:  AMS  ? 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 She was seen by oncology during that hospitalization with worsening of leptomeningeal carcinomatosis. Not felt to be neurosurgical candidate. It looks like 3/6 there was an discussion with Onc regarding life expectancy without tx and patient wished to "live as long as possible" so was continued on chemo.  ? ?In inpatient rehab, pt has experienced worsening constipation and abdominal pain. She became altered and hypotensive 3/16. Cxr was concerning for possible pneumoperitoneum. Patient is sent for CT H, CT a/p. CCS and PCCM are consulted in this setting  ? ?Pertinent  Medical History  ?Metastatic breast cancer  ? ?Significant Hospital Events: ?Including procedures, antibiotic start and stop dates in addition to other pertinent events   ?3/16 PCCM consulted to evaluate pt in CIR with AMS, hypotension, poor PO intake  ? ?Interim History / Subjective:  ? Patient is alert and oriented this morning. Patient's son and grandmother were present at bedside. [name] states she is feeling okay. She endorse some abdominal discomfort in the suprapubic area and constipation. Otherwise denies any other complaints. Family states she is more alert since previous days and closer to baseline. ? ? Sign Interpretation used: Erin [name] [phone] ? ?Objective   ?Blood pressure (!) 100/59, pulse 98, temperature 98.1 ?F (36.7 ?C), temperature source Oral, resp. rate 18, height '5\' 1"'$  (1.549 m), weight 52.8 kg, last menstrual period 07/09/2017, SpO2 96 %. ?   ?    ? ?Intake/Output Summary (Last 24 hours) at 11/28/2021 0950 ?Last data filed at 11/28/2021 0701 ?Gross per 24 hour  ?Intake 3980.67 ml  ?Output 2075 ml  ?Net 1905.67 ml  ? ? ?Filed Weights  ? 11/27/21 1608 11/28/21 0401  ?Weight: 50 kg 52.8 kg  ? ? ? ?Examination: ?General: Chronically ill; alert and oriented,  ?HENT: NCAT. Tacky mm. Anicteric sclera ?Lungs: even and unlabored.  ?Cardiovascular: normal HR, regular. Cap refill < 3sec ?Abdomen: thin, soft, decreased bowel sounds; fullness noted in the suprapubic area  ?Extremities: no acute joint deformity. No cyanosis or clubbing  ?Neuro: Opens eyes spontaneously, alert. 87m PERRL; responds to questions appropriately  ?GU: Distended bladder.  ? ?Resolved Hospital Problem list   ? ? ?Assessment & Plan:  ? ?**Note patient is deaf and uses ASL** ? ?Acute metabolic encephalopathy  ?On exam, pt was A&O, responding to questions appropriately via sign interpretor. ?-multifactorial. AKI, dehydration, possible sepsis, hypotension, hyperglycemia.  ?-Since admission pt has received total of 2L IVF bolus and transitioned on LR infusion 765mhr ?-NPO diet >> liquid diet; pt Is alert and oriented endorsing appetite; pt has preexisting radiation esophagitis which could pose difficulty swallowing, however will trial liquid diet ?-Electrolytes are WNL ?-AKI has since resolved, Cr. 1.59>>.77 ?-Blood cultures pending ?-I/O's appropriate ? ?Metastatic breast cancer with leptomeningeal spread, bone mets, liver mets ?-decadron, home regimen: xeloda, tukysa;  ?-per family, has not been able to take PO chemo meds the past several days with worsening PO intake; though today pt mental status has improved, will trial liquid diet followed by slowly reintroducing oral medications ?-will continue GOCoffeyonversations in ICU  ?-palliative consulted ?-oncology  consulted ? ?Leukocytosis ?-PCT 0.26. ?dehydration / reactive vs infectious ?- WBC trending down 20.1 >> 16.4 ?-trend fever curve WBC  ?-blood  cx pending ?-cefepime 2g ? ?Hypotension ?-suspect hypovolemia; Possible septic ?-LR IVF 43m/hr  ?-BP has been stable; SBP 90-100s with MAPs >70;  ? ?Lactic acidosis  ?AGMA ?-Anion gap closed ?-lactate levels continue to trend down ? ?AKI ?AGMA ?Hyperkalemia ?Urinary retention / concern for bladder outlet obstruction /Mild hydronephrosis ?-Foley catheter; output 2L since admission ?-IVF ?-electrolytes WNL ?-anion gap closed ?-trend renal function  ? ?Abdominal pain ?Constipation ?-CT a/p without free air; stool burden; distended bladder ?-foley present to decompress bladder  ?- Bowel regimen: Miralax & Senokot  ? ?Hyponatremia, resolved ?-IVF ?-trend BMP ? ?Hyperglycemia, resolved ?Transition off insulin gtt today ?- SSI ? ?Failure to thrive in adult ?-Poor oral intake since admission; with improvement in mental status, trial patient with liquid diet; if tolerated well, can advance diet as tolerated. ?-palliative care consult  ? ?Goals of Care ?-Per chart review, patient is set to continue home regimen and follow up with oncology outpatient. Per family, they would like to continue full scope of care. ?-Is full code. ? ?Best Practice (right click and "Reselect all SmartList Selections" daily)  ? ?Diet/type: NPO ?DVT prophylaxis: LMWH ?GI prophylaxis: N/A ?Lines: Central line ?Foley:  Yes, and it is still needed ?Code Status:  full code ?Last date of multidisciplinary goals of care discussion [3/16 -- discussed with patients sisters at bedside. It does not sounds like [place] has [place] and are very shocked by acute decompensation as well as possibility that this underlying metastatic cancer may not be fixable] ? ?Labs   ?CBC: ?Recent Labs  ?Lab 11/25/21 ?1400 11/26/21 ?0435 11/27/21 ?1309 11/28/21 ?0135  ?WBC 11.6* 12.7* 20.1* 16.4*  ?NEUTROABS  --  10.9* 18.4*  --   ?HGB 13.1 12.9 14.2 10.8*  ?HCT 39.0 38.2 42.2 32.6*  ?MCV 97.3 98.2 98.4 98.8  ?PLT 128* 120* 126* 90*  ? ? ?Basic Metabolic  Panel: ?Recent Labs  ?Lab 11/26/21 ?0435 11/27/21 ?1309 11/27/21 ?1610 11/27/21 ?1743 11/27/21 ?2202 11/28/21 ?0135  ?NA 130* 131* 134*  --  141 145  ?K 5.1 6.1* 5.5*  --  4.0 3.7  ?CL 99 99 103  --  110 112*  ?CO2 20* 14* 13*  --  20* 23  ?GLUCOSE 398* 667* 569*  --  255* 231*  ?BUN 30* 73* 67*  --  41* 32*  ?CREATININE 1.00 1.76* 1.59*  --  0.93 0.77  ?CALCIUM 7.7* 7.5* 7.2*  --  7.2* 7.2*  ?MG  --   --   --  3.1*  --   --   ? ?GFR: ?Estimated Creatinine Clearance: 57.8 mL/min (by C-G formula based on SCr of 0.77 mg/dL). ?Recent Labs  ?Lab 11/25/21 ?1400 11/26/21 ?0435 11/27/21 ?1309 11/27/21 ?1610 11/27/21 ?2202 11/28/21 ?0135  ?PROCALCITON  --   --  0.26  --   --   --   ?WBC 11.6* 12.7* 20.1*  --   --  16.4*  ?LATICACIDVEN  --   --  3.5* 3.8* 3.4*  --   ? ? ?Liver Function Tests: ?Recent Labs  ?Lab 11/26/21 ?0435 11/27/21 ?1610  ?AST 25 30  ?ALT 54* 52*  ?ALKPHOS 87 82  ?BILITOT 1.0 1.2  ?PROT 6.2* 5.9*  ?ALBUMIN 3.3* 3.2*  ? ?No results for input(s): LIPASE, AMYLASE in the last 168 hours. ?No results for input(s): AMMONIA in the last  168 hours. ? ?ABG ?No results found for: PHART, PCO2ART, PO2ART, HCO3, TCO2, ACIDBASEDEF, O2SAT  ? ?Coagulation Profile: ?No results for input(s): INR, PROTIME in the last 168 hours. ? ?Cardiac Enzymes: ?No results for input(s): CKTOTAL, CKMB, CKMBINDEX, TROPONINI in the last 168 hours. ? ?HbA1C: ?HbA1c, POC (controlled diabetic range)  ?Date/Time Value Ref Range Status  ?04/04/2021 10:01 AM 5.7 0.0 - 7.0 % Final  ? ?Hgb A1c MFr Bld  ?Date/Time Value Ref Range Status  ?08/22/2021 03:25 PM 6.5 (H) 4.8 - 5.6 % Final  ?  Comment:  ?  (NOTE) ?Pre diabetes:          5.7%-6.4% ? ?Diabetes:              >6.4% ? ?Glycemic control for   <7.0% ?adults with diabetes ?  ?11/19/2020 10:01 AM 5.2 4.8 - 5.6 % Final  ?  Comment:  ?           Prediabetes: 5.7 - 6.4 ?         Diabetes: >6.4 ?         Glycemic control for adults with diabetes: <7.0 ?  ? ? ?CBG: ?Recent Labs  ?Lab 11/28/21 ?0359  11/28/21 ?0503 11/28/21 ?4656 11/28/21 ?0800 11/28/21 ?0904  ?GLUCAP 155* 153* 146* 134* 134*  ? ? ?Review of Systems:   ?Unable to obtain due to encephalopathy  ? ?Past Medical History:  ?She,  has a past medical h

## 2021-11-28 NOTE — Progress Notes (Signed)
Oncology ?Reviewed the patient's chart and discussed with the patient using sign language interpreter with patient's mother and her aunt in the room. ? ?Metastatic breast cancer with leptomeningeal carcinomatosis with extensive liver metastases: Progressed on prior therapies and inability to tolerate oral chemotherapy with Xeloda and Tucatinib.  Patient developed failure to thrive with intractable nausea vomiting and developed renal failure with metabolic encephalopathy and came to the hospital and was admitted. ?Prognosis: I informed the patient and her family that she has extremely poor prognosis with survival measured in the matter of few weeks and no further treatment is likely to benefit her. ?Family and the patient agreed to hospice care and comfort care measures only. ?DNR CC ?Patient's family is extremely distraught but understand the gravity of her situation and that no amount of further systemic treatment is likely to provide her any benefit. ?She would be eligible for inpatient hospice care in my opinion. ?I informed the patient's nursing team as well. ?

## 2021-11-28 NOTE — Progress Notes (Signed)
Inpatient Diabetes Program Recommendations ? ?AACE/ADA: New Consensus Statement on Inpatient Glycemic Control (2015) ? ?Target Ranges:  Prepandial:   less than 140 mg/dL ?     Peak postprandial:   less than 180 mg/dL (1-2 hours) ?     Critically ill patients:  140 - 180 mg/dL  ? ? Latest Reference Range & Units 11/27/21 13:09  ?Sodium 135 - 145 mmol/L 131 (L)  ?Potassium 3.5 - 5.1 mmol/L 6.1 (H)  ?Chloride 98 - 111 mmol/L 99  ?CO2 22 - 32 mmol/L 14 (L)  ?Glucose 70 - 99 mg/dL 667 (HH)  ?BUN 6 - 20 mg/dL 73 (H)  ?Creatinine 0.44 - 1.00 mg/dL 1.76 (H)  ?Calcium 8.9 - 10.3 mg/dL 7.5 (L)  ?Anion gap 5 - 15  18 (H)  ? ? Latest Reference Range & Units 11/27/21 16:10  ?Beta-Hydroxybutyric Acid 0.05 - 0.27 mmol/L 5.14 (H)  ?(H): Data is abnormally high ? Latest Reference Range & Units 11/27/21 20:50 11/27/21 21:47 11/27/21 22:46 11/27/21 23:49 11/28/21 00:50 11/28/21 01:54 11/28/21 02:57 11/28/21 03:59 11/28/21 05:03  ?Glucose-Capillary 70 - 99 mg/dL 257 (H) ? ?IV Insulin Infusing 235 (H) 221 (H) 209 (H) 238 (H) 201 (H) 198 (H) 155 (H) 153 (H) ? ?IV Insulin Infusing  ?(H): Data is abnormally high ? ? ?Admit 11/14/2021 with Progressive bilateral lower extremity weakness and numbness ?Discharged to [facility] 11/25/2021 ?Transferred to ICU 11/27/2021 with Worsening constipation and abdominal pain/ Hyperglycemia/ Anion Gap Metabolic acidosis--Pt became altered and hypotensive 3/16--Cxr was concerning for possible pneumoperitoneum ? ? ?History:  ?Leptomeningeal metastases ?Metastatic left-sided breast cancer ?Extensive osseous/spinal metastases ?Hepatic metastasis  ?S/p whole brain radiation ? ?No History of Diabetes noted ? ? ?Current Orders: Levemir 15 units BID ?    Novolog 1-2-3 units Q4 hours ?    Novolog 5 units Q4 hours ? ? ? ?MD- Note Levemir 15 units given at 7am for transition off the IV Insulin Drip.  BMET from 1:35am showed Glucose 231/ Anion Gap 10/ CO2 level 23. ? ?Currently pt is NPO and has orders for  Novolog Tube Feed coverage ? ?Please Discontinue the Novolog 5 units Q4 hours tube feed coverage since pt currently not receiving feeds ? ? ? ?--Will follow patient during hospitalization-- ? ?Wyn Quaker RN, MSN, CDE ?Diabetes Coordinator ?Inpatient Glycemic Control Team ?Team Pager: [phone] (8a-5p) ? ? ?

## 2021-11-28 NOTE — TOC Progression Note (Signed)
Transition of Care (TOC) - Progression Note  ? ? ?Patient Details  ?Name: Erin [name] ?MRN: [id number] ?Date of Birth: Oct 30, 1960 ? ?Transition of Care (TOC) CM/SW Contact  ?Angelita Ingles, RN ?Phone [phone] ? ?11/28/2021, 3:33 PM ? ?Clinical Narrative:    ? ?Transition of Care (TOC) Screening Note ? ? ?Patient Details  ?Name: Erin [name] ?Date of Birth: 26-Apr-1961 ? ? ?Transition of Care (TOC) CM/SW Contact:    ?Angelita Ingles, RN ?Phone Number: ?11/28/2021, 3:34 PM ? ? ? ?Transition of Care Department [facility]) has reviewed patient and no TOC needs have been identified at this time. We will continue to monitor patient advancement through interdisciplinary progression rounds. If new patient transition needs arise, please place a TOC consult. ? ? ? ? ?  ?  ? ?Expected Discharge Plan and Services ?  ?  ?  ?  ?  ?                ?  ?  ?  ?  ?  ?  ?  ?  ?  ?  ? ? ?Social Determinants of Health (SDOH) Interventions ?  ? ?Readmission Risk Interventions ?Readmission Risk Prevention Plan 01/31/2020  ?Transportation Screening Complete  ?PCP or Specialist Appt within 3-5 Days Complete  ?[place] or Home Care Consult Complete  ?Social Work Consult for [place] Planning/Counseling Complete  ?Palliative Care Screening Not Applicable  ?Medication Review Press photographer) Complete  ?Some recent data might be hidden  ? ? ?

## 2021-11-28 NOTE — Discharge Summary (Shared)
Physician Discharge Summary  ?Patient ID: ?Erin [name] ?MRN: [id number] ?DOB/AGE: 02/25/1961 61 y.o. ? ?Admit date: 11/27/2021 ?Discharge date: 11/28/2021 ? ?Problem List ?Principal Problem: ?  Acute metabolic encephalopathy ?Active Problems: ?  Pressure injury of skin ?Metastatic breast cancer with leptomeningeal carcinomatosis with extensive liver metastases  ?Failure to thrive ?AKI ?Hyperkalemia ?Diabetes with hyperglycemia  ?Hypovolemia  ? 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 She was seen by oncology during that hospitalization with worsening of leptomeningeal carcinomatosis. Not felt to be neurosurgical candidate. It looks like 3/6 there was an discussion with Onc regarding life expectancy without tx and patient wished to "live as long as possible" so was continued on chemo.  ?  ?In inpatient rehab, pt has experienced worsening constipation and abdominal pain. She became altered and hypotensive 3/16. The patient was discharged from [facility] and admitted to ICU under PCCM service 3/16 in this setting  ? ? ?Hospital Course: ?3/16- Admitted to ICU from CIR with metabolic encephalopathy, AKI, hyperkalemia, hypovolemia / dehydration, FTT, severe hyperglycemia, constipation, urinary retention with suspected bladder outlet obstruction. Started on IVF, insulin gtt, empiric abx. Discussions with family about goals of care given underlying widely metastatic disease, progressive functional decline and failure to thrive.  ?3/17- improved mentation. weaned off insulin gtt. Stable to transfer out of ICU. Oncology consulted to discuss goals of care and based on poor prognosis, a decision was reached to pursue comfort measures only and hospice care. DNR ?3/18- ***  ? ?Physical Exam at  Discharge: ? ?General ?Neuro ?HEENT ?Pulm ?CV ?GI ?GU ?MSK ? ? ?Labs at discharge ?Lab Results  ?Component Value Date  ? CREATININE 0.77 11/28/2021  ? BUN 32 (H) 11/28/2021  ? NA 145 11/28/2021  ? K 3.7 11/28/2021  ? CL 112 (H) 11/28/2021  ? CO2 23 11/28/2021  ? ?Lab Results  ?Component Value Date  ? WBC 16.4 (H) 11/28/2021  ? HGB 10.8 (L) 11/28/2021  ? HCT 32.6 (L) 11/28/2021  ? MCV 98.8 11/28/2021  ? PLT 90 (L) 11/28/2021  ? ?Lab Results  ?Component Value Date  ? ALT 52 (H) 11/27/2021  ? AST 30 11/27/2021  ? ALKPHOS 82 11/27/2021  ? BILITOT 1.2 11/27/2021  ? ?Lab Results  ?Component Value Date  ? INR 1.0 08/22/2021  ? INR 1.1 01/30/2020  ? INR 0.9 10/31/2019  ? ? ?Current radiology studies ?CT HEAD WO CONTRAST (5MM) ? ?Result Date: 11/27/2021 ?CLINICAL DATA:  Altered mental status. History of metastatic breast cancer. EXAM: CT HEAD WITHOUT CONTRAST TECHNIQUE: Contiguous axial images were obtained from the base of the skull through the vertex without intravenous contrast. RADIATION DOSE REDUCTION: This exam was performed according to the departmental dose-optimization program which includes automated exposure control, adjustment of the mA and/or kV according to patient size and/or use of iterative reconstruction technique. COMPARISON:  Head CT and MRI 11/14/2021 FINDINGS: Brain: There is no evidence of an acute infarct, intracranial hemorrhage, mass, midline shift, or extra-axial fluid collection. 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 Hypodensity in the cerebellar vermis and medial aspects of the left greater than right cerebellar hemispheres is similar to the prior CT and may reflect mild residual edema or gliosis related to previously shown leptomeningeal disease in this region. There is no associated mass effect. Ex vacuo dilatation of the fourth  ventricle is unchanged. The lateral and third ventricles are normal in size. Vascular: No hyperdense vessel. Skull: No acute fracture or suspicious  osseous lesion. Sinuses/Orbits: Visualized paranasal sinuses and mastoid air cells are clear. Unremarkable orbits. Other: None. IMPRESSION: 1. No evidence of acute intracranial abnormality. 2. Chronic findings as above. Electronically Signed   By: Logan Bores M.D.   On: 11/27/2021 14:35  ? ?CT ABDOMEN PELVIS W CONTRAST ? ?Result Date: 11/27/2021 ?CLINICAL DATA:  61 year old female with history of metastatic breast cancer presenting with clinical concern for peritonitis or bowel perforation. EXAM: CT ABDOMEN AND PELVIS WITH CONTRAST TECHNIQUE: Multidetector CT imaging of the abdomen and pelvis was performed using the standard protocol following bolus administration of intravenous contrast. RADIATION DOSE REDUCTION: This exam was performed according to the departmental dose-optimization program which includes automated exposure control, adjustment of the mA and/or kV according to patient size and/or use of iterative reconstruction technique. CONTRAST:  170m OMNIPAQUE IOHEXOL 300 MG/ML  SOLN COMPARISON:  10/24/2021 FINDINGS: Lower chest: No acute abnormality. Unchanged paravertebral bilateral lower lobe subsegmental atelectasis. Hepatobiliary: Again seen is a 3.3 x 2.3 cm well-defined rounded hypodensity in hepatic segment 5. Interval development in increased conspicuity of innumerable scattered rounded hypodensities throughout the hepatic parenchyma, most prominent in the right lobe. Pancreas: Unremarkable. No pancreatic ductal dilatation or surrounding inflammatory changes. Spleen: Normal in size without focal abnormality. Adrenals/Urinary Tract: Adrenal glands are unremarkable. Mild bilateral hydronephrosis and hydroureter. Bladder is markedly distended, extending to just superior to the level of the umbilicus. Stomach/Bowel: Small hiatal hernia. Stomach is within normal limits. No evidence of significant small bowel distension or mural thickening. Moderate colonic stool burden. No bowel wall thickening or  significant distension. Vascular/Lymphatic: No significant vascular findings are present. No enlarged abdominal or pelvic lymph nodes. Reproductive: Status post hysterectomy. No adnexal masses. Other: No abdominal wall hernia or abnormality. No abdominopelvic ascites. Musculoskeletal: Diffuse osteoblastic metastatic lesions are again seen throughout the visualized axial skeleton. No evidence of pathologic fracture. IMPRESSION: 1. Marked distension of the urinary bladder with associated mild bilateral hydronephrosis concerning for bladder outlet obstruction. 2. Poorly defined innumerable rounded hypodensities throughout the hepatic parenchyma concerning for multifocal metastatic foci. 3. Similar appearing diffuse sclerotic metastases of the visualized axillary skeleton. 4. Similar appearing small hiatal hernia. DRuthann Cancer MD [facility]orporated[facility] Electronically Signed   By: DRuthann CancerM.D.   On: 11/27/2021 14:45  ? ?DG CHEST PORT 1 VIEW ? ?Result Date: 11/27/2021 ?CLINICAL DATA:  Breast cancer patient with metastases. New leukocytosis on steroids. Very lethargic. EXAM: PORTABLE CHEST 1 VIEW COMPARISON:  CT chest 10/24/2021 FINDINGS: Right chest wall porta catheter tip overlies the central superior vena cava. Minimal calcification within the aortic arch. Cardiac silhouette and mediastinal contours are within normal limits. Horizontal bandlike mild scarring overlying the mid to lower right lung also seen within the superior segment of the right lower lobe on prior CT. The left lung is clear. No pleural effusion or pneumothorax. There is air seen just deep to the left hemidiaphragm presumably within the stomach. There is also air within distal transverse colon overlying this region of the stomach. No definite pneumoperitoneum is seen, as the right hemidiaphragm is slightly higher than the left and no right subdiaphragmatic free air is seen. No acute skeletal  abnormality. There are sclerotic foci within the bilateral proximal humeri and the right glenoid consistent with known osseous metastases. IMPRESSION:: IMPRESSION: 1. Unchanged horizontal bandlike mild scarring overlying the

## 2021-11-29 LAB — BASIC METABOLIC PANEL
Anion gap: 5 (ref 5–15)
BUN: 16 mg/dL (ref 6–20)
CO2: 27 mmol/L (ref 22–32)
Calcium: 7.3 mg/dL — ABNORMAL LOW (ref 8.9–10.3)
Chloride: 108 mmol/L (ref 98–111)
Creatinine, Ser: 0.44 mg/dL (ref 0.44–1.00)
GFR, Estimated: >60 mL/min (ref >60)
Glucose, Bld: 195 mg/dL — ABNORMAL HIGH (ref 70–99)
Potassium: 3.9 mmol/L (ref 3.5–5.1)
Sodium: 140 mmol/L (ref 135–145)

## 2021-11-29 LAB — GLUCOSE, CAPILLARY
Glucose-Capillary: 198 mg/dL — ABNORMAL HIGH (ref 70–99)
Glucose-Capillary: 168 mg/dL — ABNORMAL HIGH (ref 70–99)
Glucose-Capillary: 195 mg/dL — ABNORMAL HIGH (ref 70–99)
Glucose-Capillary: 206 mg/dL — ABNORMAL HIGH (ref 70–99)
Glucose-Capillary: 264 mg/dL — ABNORMAL HIGH (ref 70–99)

## 2021-11-29 LAB — CBC
HCT: 30.4 % — ABNORMAL LOW (ref 36.0–46.0)
Hemoglobin: 10.1 g/dL — ABNORMAL LOW (ref 12.0–15.0)
MCH: 32.6 pg (ref 26.0–34.0)
MCHC: 33.2 g/dL (ref 30.0–36.0)
MCV: 98.1 fL (ref 80.0–100.0)
Platelets: 72 10*3/uL — ABNORMAL LOW (ref 150–400)
RBC: 3.1 MIL/uL — ABNORMAL LOW (ref 3.87–5.11)
RDW: 16.9 % — ABNORMAL HIGH (ref 11.5–15.5)
WBC: 13.5 10*3/uL — ABNORMAL HIGH (ref 4.0–10.5)
nRBC: 0 % (ref 0.0–0.2)

## 2021-11-29 MED ORDER — SODIUM CHLORIDE 0.9% FLUSH: 10.0000 mL | INTRAVENOUS | Status: DC | PRN

## 2021-11-29 MED ORDER — DRONABINOL 2.5 MG PO CAPS
2.5000 mg | ORAL_CAPSULE | Freq: Two times a day (BID) | ORAL | Status: DC
  Administered 2021-11-29: 2.5 mg via ORAL
  Filled 2021-11-29 (×2): qty 1

## 2021-11-29 NOTE — Progress Notes (Signed)
Manufacturing engineer [facility]) Hospital Liaison Note ? ?Received request from [facility] [place]  for family interest in [facility]. Visited patient at bedside and spoke with family at bedside & patient to confirm interest and explain services. ? ?Approval for [employer] is determined by [facility] MD. Once [facility] MD has determined [facility] eligibility, Miller will update hospital staff and family. ? ?Please do not hesitate to call with any hospice related questions.  ?  ?Thank you for the opportunity to participate in this patient's care. ? ?Daphene Calamity, MSW ?[facility] [place] Hospital Liaison  ?[phone] ? ?

## 2021-11-29 NOTE — Progress Notes (Signed)
Inpatient Rehab Admissions Coordinator:  ? ?Note that Pt. Is transitioning to comfort measures/hospice. I will not attempt to bring Pt. Back to CIR.  ? ?Clemens Catholic, MS, CCC-SLP ?Rehab Admissions Coordinator  ?[phone] (celll) ?[phone] (office) ? ?

## 2021-11-29 NOTE — TOC Initial Note (Signed)
Transition of Care (TOC) - Initial/Assessment Note  ? ? ?Patient Details  ?Name: Erin [name] ?MRN: [id number] ?Date of Birth: 13-Sep-1961 ? ?Transition of Care [facility]) CM/SW Contact:    ?Joanne Chars, LCSW ?Phone Number: ?11/29/2021, 12:42 PM ? ?Clinical Narrative:     CSW informed by MD that family would like to proceed with hospice services.  CSW spoke by phone with pt mother Zena Amos and her children Darcus Austin and Animas.  Discussed choice and they do want to proceed with [facility].  Family was under the impression there was a hospice unit at the hospital and [place] educated on home hospice vs residential options.  CSW contacted [place] with [facility] who will contact family.              ? ? ?Expected Discharge Plan: [place] ?Barriers to Discharge: Other (must enter comment) (pending hospice eligibility and services) ? ? ?Patient Goals and CMS Choice ?  ?  ?Choice offered to / list presented to : Parent, Adult Children ? ?Expected Discharge Plan and Services ?Expected Discharge Plan: [place] ?  ?  ?Post Acute Care Choice: Hospice ?  ?                ?  ?  ?  ?  ?  ?  ?  ?  ?  ?  ? ?Prior Living Arrangements/Services ?  ?  ?Patient language and need for interpreter reviewed:: No ?       ?Need for Family Participation in Patient Care: Yes (Comment) ?Care giver support system in place?: Yes (comment) ?  ?Criminal Activity/Legal Involvement Pertinent to Current Situation/Hospitalization: No - Comment as needed ? ?Activities of Daily Living ?  ?  ? ?Permission Sought/Granted ?  ?  ?   ?   ?   ?   ? ?Emotional Assessment ?  ?  ?  ?  ?  ?  ? ?Admission diagnosis:  Acute metabolic encephalopathy [Z48.27] ?Patient Active Problem List  ? Diagnosis Date Noted  ? Acute metabolic encephalopathy 07/86/7544  ? Pressure injury of skin 11/27/2021  ? Acute encephalopathy 11/27/2021  ? Obtundation 11/27/2021  ? Hypotension 11/27/2021  ? Acute renal failure ([place]) 11/27/2021  ? Hyperkalemia  11/27/2021  ? Abdominal pain 11/27/2021  ? Metastatic breast cancer ([place]) 11/25/2021  ? Lower extremity weakness 11/14/2021  ? Metastatic breast carcinoma ([place]) 08/27/2021  ? 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 Burning in the chest   ? Vomiting   ? Dysphagia 01/29/2020  ? 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  ? Esophageal candidiasis ([place]) 01/29/2020  ? Bone metastases ([place]) 12/28/2019  ? Pain from bone metastases ([place]) 12/28/2019  ? Goals of care, counseling/discussion 11/07/2019  ? 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 Elevated CA-125 10/09/2019  ? Paresthesia 04/17/2019  ? BRCA2 gene mutation positive 02/17/2018  ? Genetic testing 02/17/2018  ? Family history of breast cancer   ? Family history of colon cancer   ? 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 S/P vaginal hysterectomy 11/04/2017  ? Ovarian mass, left 08/26/2017  ? Menorrhagia 08/26/2017  ? Port-A-Cath in place 07/23/2017  ? Malignant neoplasm of lower-outer quadrant of left breast of female, estrogen receptor negative ([place]) 05/13/2017  ? Abdominal bloating 04/02/2017  ? Chronic right-sided low back pain without sciatica 07/02/2016  ? DM type  2 (diabetes mellitus, type 2) ([place]) 11/18/2014  ? Leucocytosis 06/06/2010  ? Gastritis 06/06/2010  ? Intermittent constipation 06/06/2010  ? UNSPECIFIED IRON DEFICIENCY ANEMIA 04/22/2010  ? DEPRESSION 04/10/2010  ? Hearing loss 04/10/2010  ? ?PCP:  [name], NP ?Pharmacy:   ?[facility] ?[address] ?[place] [address] ?Phone: [phone] Fax: [phone] ? ? ? ? ?Social Determinants of Health (SDOH) Interventions ?  ? ?Readmission Risk Interventions ?Readmission Risk Prevention Plan 01/31/2020  ?Transportation Screening Complete  ?PCP or Specialist Appt within 3-5 Days  Complete  ?Power or Home Care Consult Complete  ?Social Work Consult for [place] Planning/Counseling Complete  ?Palliative Care Screening Not Applicable  ?Medication Review Press photographer) Complete  ?Some recent data might be hidden  ? ? ? ?

## 2021-11-29 NOTE — Plan of Care (Signed)

## 2021-11-29 NOTE — Progress Notes (Signed)
?PROGRESS NOTE ? ? ? ?Erin [name]  ZTI:[id number] DOB: Feb 10, 1961 DOA: 11/27/2021 ?PCP: [name], NP  ? ?  ?Brief Narrative:  ?Patient is a 61 year old female with a past medical history of metastatic breast cancer and progressive leptomeningeal disease.  She was admitted for lower extremity weakness due to progression of the latter and was discharged to inpatient rehab.  Admitted to the ICU on 3/16 from inpatient rehab for altered mental status, acute kidney injury, hyperkalemia, acute urinary retention, dehydration, and hypotension.  She had fluids and subsequently improved back to her baseline. ? ?New events last 24 hours / Subjective: ?Her Oncologist Dr. Verdie Drown yesterday relayed the poor prognosis and suggestion they enter hospice.  I spoke with the patient and her family today using her mother to interpret using sign language.  She has no pain but is lacking in appetite.  She would be interested in having something that would help with this.  Hoping for inpatient hospice. ? ?Assessment & Plan: ?  ?Principal Problem: ?  Acute metabolic encephalopathy ?Active Problems: ?  DM type 2 (diabetes mellitus, type 2) ([place]) ?  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  Metastatic breast cancer ([place]) ?  Pressure injury of skin ? ? ?Metabolic encephalopathy ? Resolved ? ?Metastatic breast cancer/leptomeningeal disease ? Appreciate oncology input ? I called the hospice/palliative team today ? I added Marinol twice daily to help with her appetite ? She is in no pain but will let us know if anything changes ?  ?Diabetes ? Sliding scale insulin only ? ?In agreement with assessment of the pressure ulcer as below:  ?Pressure Injury 11/27/21 Sacrum Lower;Mid Stage 2 -  Partial thickness loss of dermis presenting as a shallow open injury with a red, pink wound bed without slough. area of pink and red skin on sacrum (Active)  ?11/27/21 1554  ?Location: Sacrum  ?Location Orientation: Lower;Mid  ?Staging: Stage 2 -  Partial thickness  loss of dermis presenting as a shallow open injury with a red, pink wound bed without slough.  ?Wound Description (Comments): area of pink and red skin on sacrum  ?Present on Admission: Yes  ? ? ? ?  ? ? ?DVT prophylaxis: SCDs ?Code Status: DNR ?Family Communication: Mother, sister, brother-in-law ?Coming From: CIR ?Disposition Plan: Hospice ?Barriers to Discharge: Pallitive care/hospice disposition ? ?Consultants:  ?Hospice/Palliative care consulted today ?Her oncologist is following ? ?Procedures:  ?None ? ?Antimicrobials:  ?Anti-infectives (From admission, onward)  ? ? Start     Dose/Rate Route Frequency Ordered Stop  ? 11/28/21 1600  ceFEPIme (MAXIPIME) 2 g in sodium chloride 0.9 % 100 mL IVPB  Status:  Discontinued       ? 2 g ?200 mL/hr over 30 Minutes Intravenous Every 24 hours 11/27/21 1622 11/28/21 0727  ? 11/28/21 0900  ceFEPIme (MAXIPIME) 2 g in sodium chloride 0.9 % 100 mL IVPB       ? 2 g ?200 mL/hr over 30 Minutes Intravenous Every 12 hours 11/28/21 0727    ? 11/27/21 1700  ceFEPIme (MAXIPIME) 2 g in sodium chloride 0.9 % 100 mL IVPB       ? 2 g ?200 mL/hr over 30 Minutes Intravenous  Once 11/27/21 1611 11/27/21 1716  ? ?  ? ? ? ?Objective: ?Vitals:  ? 11/28/21 1200 11/29/21 0020 11/29/21 0346 11/29/21 0347  ?BP: 106/62 108/64 112/68   ?Pulse: 88 90 88   ?Resp: '15 16 18   '$ ?Temp: 98.7 ?F (37.1 ?C) 97.6 ?F (36.4 ?C) 98.3 ?  F (36.8 ?C)   ?TempSrc: Oral Oral Axillary   ?SpO2: 97% 98% 100%   ?Weight:    52.5 kg  ?Height:      ? ? ?Intake/Output Summary (Last 24 hours) at 11/29/2021 1213 ?Last data filed at 11/29/2021 0900 ?Gross per 24 hour  ?Intake 1551.82 ml  ?Output 950 ml  ?Net 601.82 ml  ? ?Filed Weights  ? 11/27/21 1608 11/28/21 0401 11/29/21 0347  ?Weight: 50 kg 52.8 kg 52.5 kg  ? ? ?Examination:  ?General exam: Appears calm and comfortable  ?Respiratory system: No respiratory distress. No conversational dyspnea.  ?Central nervous system: Alert and oriented. No focal neurological deficits.   ?Extremities: Symmetric in appearance  ?Skin: No rashes, lesions or ulcers on exposed skin  ?Psychiatry: Judgement and insight appear normal. Mood & affect appropriate.  ? ?Data Reviewed: I have personally reviewed following labs and imaging studies ? ?CBC: ?Recent Labs  ?Lab 11/25/21 ?1400 11/26/21 ?0435 11/27/21 ?1309 11/28/21 ?0135 11/29/21 ?0120  ?WBC 11.6* 12.7* 20.1* 16.4* 13.5*  ?NEUTROABS  --  10.9* 18.4*  --   --   ?HGB 13.1 12.9 14.2 10.8* 10.1*  ?HCT 39.0 38.2 42.2 32.6* 30.4*  ?MCV 97.3 98.2 98.4 98.8 98.1  ?PLT 128* 120* 126* 90* 72*  ? ?Basic Metabolic Panel: ?Recent Labs  ?Lab 11/27/21 ?1309 11/27/21 ?1610 11/27/21 ?1743 11/27/21 ?2202 11/28/21 ?0135 11/29/21 ?0120  ?NA 131* 134*  --  141 145 140  ?K 6.1* 5.5*  --  4.0 3.7 3.9  ?CL 99 103  --  110 112* 108  ?CO2 14* 13*  --  20* 23 27  ?GLUCOSE 667* 569*  --  255* 231* 195*  ?BUN 73* 67*  --  41* 32* 16  ?CREATININE 1.76* 1.59*  --  0.93 0.77 0.44  ?CALCIUM 7.5* 7.2*  --  7.2* 7.2* 7.3*  ?MG  --   --  3.1*  --   --   --   ? ?GFR: ?Estimated Creatinine Clearance: 57.8 mL/min (by C-G formula based on SCr of 0.44 mg/dL). ? ?Liver Function Tests: ?Recent Labs  ?Lab 11/26/21 ?0435 11/27/21 ?1610  ?AST 25 30  ?ALT 54* 52*  ?ALKPHOS 87 82  ?BILITOT 1.0 1.2  ?PROT 6.2* 5.9*  ?ALBUMIN 3.3* 3.2*  ? ?CBG: ?Recent Labs  ?Lab 11/28/21 ?1656 11/28/21 ?2025 11/29/21 ?3419 11/29/21 ?0827 11/29/21 ?1143  ?GLUCAP 238* 202* 198* 168* 195*  ? ?Sepsis Labs: ?Recent Labs  ?Lab 11/27/21 ?1309 11/27/21 ?1610 11/27/21 ?2202  ?PROCALCITON 0.26  --   --   ?LATICACIDVEN 3.5* 3.8* 3.4*  ? ? ?Recent Results (from the past 240 hour(s))  ?Urine Culture     Status: Abnormal (Preliminary result)  ? Collection Time: 11/27/21  8:53 AM  ? Specimen: Urine, Clean Catch  ?Result Value Ref Range Status  ? Specimen Description URINE, CLEAN CATCH  Final  ? Special Requests NONE  Final  ? Culture (A)  Final  ?  >=100,000 COLONIES/mL CITROBACTER AMALONATICUS ?SUSCEPTIBILITIES TO  FOLLOW ?Performed at [facility], [place] [address], [place], [place] [address] ?  ? Report Status PENDING  Incomplete  ?MRSA Next Gen by PCR, Nasal     Status: None  ? Collection Time: 11/27/21  4:05 PM  ? Specimen: Nasal Mucosa; Nasal Swab  ?Result Value Ref Range Status  ? MRSA by PCR Next Gen NOT DETECTED NOT DETECTED Final  ?  Comment: (NOTE) ?The GeneXpert MRSA Assay (FDA approved for NASAL specimens only), ?is one component  of a comprehensive MRSA colonization surveillance ?program. It is not intended to diagnose MRSA infection nor to guide ?or monitor treatment for MRSA infections. ?Test performance is not FDA approved in patients less than 2 years ?old. ?Performed at [facility], [place] [address] [place], [place] ?[address] ?  ?Culture, blood (routine x 2)     Status: None (Preliminary result)  ? Collection Time: 11/27/21  4:10 PM  ? Specimen: BLOOD RIGHT HAND  ?Result Value Ref Range Status  ? Specimen Description BLOOD RIGHT HAND  Final  ? Special Requests   Final  ?  BOTTLES DRAWN AEROBIC ONLY Blood Culture results may not be optimal due to an inadequate volume of blood received in culture bottles  ? Culture   Final  ?  NO GROWTH 2 DAYS ?Performed at [facility], [place] [address], [place], [place] [address] ?  ? Report Status PENDING  Incomplete  ?Culture, blood (routine x 2)     Status: None (Preliminary result)  ? Collection Time: 11/27/21  4:19 PM  ? Specimen: BLOOD RIGHT HAND  ?Result Value Ref Range Status  ? Specimen Description BLOOD RIGHT HAND  Final  ? Special Requests   Final  ?  BOTTLES DRAWN AEROBIC ONLY Blood Culture results may not be optimal due to an inadequate volume of blood received in culture bottles  ? Culture   Final  ?  NO GROWTH 2 DAYS ?Performed at [facility], [place] [address] [place], [place] [address] ?  ? Report Status PENDING  Incomplete  ?  ? ? ?Radiology Studies: ?CT HEAD WO CONTRAST (5MM) ? ?Result Date: 11/27/2021 ?CLINICAL DATA:  Altered mental  status. History of metastatic breast cancer. EXAM: CT HEAD WITHOUT CONTRAST TECHNIQUE: Contiguous axial images were obtained from the base of the skull through the vertex without intravenous contrast. RADIATION DOSE REDUCTION: This

## 2021-11-29 NOTE — Plan of Care (Signed)

## 2021-11-30 LAB — GLUCOSE, CAPILLARY
Glucose-Capillary: 181 mg/dL — ABNORMAL HIGH (ref 70–99)
Glucose-Capillary: 172 mg/dL — ABNORMAL HIGH (ref 70–99)

## 2021-11-30 LAB — URINE CULTURE: Culture: >=100000 — AB

## 2021-11-30 NOTE — TOC Transition Note (Addendum)
Transition of Care (TOC) - CM/SW Discharge Note ? ? ?Patient Details  ?Name: Erin [name] ?MRN: [id number] ?Date of Birth: 1961-01-26 ? ?Transition of Care (TOC) CM/SW Contact:  ?Carolin Sicks, LCSWA ?Phone Number: ?11/30/2021, 12:35 PM ? ? ?Clinical Narrative:    ?Patient will Discharge To: [name] ?Anticipated DC Date:November 30, 2021 ?Family Notified:yes, mother Amparo Bristol (in pt's room) ?Transport TD:SKAJ ? ? ?Per MD patient ready for DC to  [facility]. RN, patient, patient's family, and facility notified of DC. Assessment, Fl2/Pasrr, and Discharge Summary sent to facility. RN given number for report [phone]). DC packet on chart. Ambulance transport requested for patient.  ?Note: Contact [facility] [employer] to interpreter for pt when Corey Harold is transporting to [facility] at [phone]. ? ?[facility] signing off. ? ?Reed Breech ?LCSWA ?[phone] ?  ? ? ?Final next level of care: [place] ?Barriers to Discharge: No Barriers Identified ? ? ?Patient Goals and CMS Choice ?  ?  ?Choice offered to / list presented to : Parent, Adult Children ? ?Discharge Placement ?  ?           ?Patient [facility] bed at:  [facility]) ?Patient to be transferred to facility by: [facility] ?Name of family member notified: Amparo Bristol ?Patient and family notified of of transfer: 11/30/21 ? ?Discharge Plan and Services ?  ?  ?Post Acute Care Choice: Hospice          ?  ?  ?  ?  ?  ?  ?  ?  ?  ?  ? ?Social Determinants of Health (SDOH) Interventions ?  ? ? ?Readmission Risk Interventions ?Readmission Risk Prevention Plan 01/31/2020  ?Transportation Screening Complete  ?PCP or Specialist Appt within 3-5 Days Complete  ?[place] or Home Care Consult Complete  ?Social Work Consult for [place] Planning/Counseling Complete  ?Palliative Care Screening Not Applicable  ?Medication Review Press photographer) Complete  ?Some recent data might be hidden  ? ? ? ? ? ?

## 2021-11-30 NOTE — Discharge Summary (Signed)
Physician Discharge Summary  [name]:[id number] DOB: 02/02/1961 DOA: 11/27/2021 ? ?PCP: [name], NP ? ?Admit date: 11/27/2021 ?Discharge date: 11/30/2021 ? ?Admitted From: CIR ?Disposition: Residential hospice, [facility] ? ?Recommendations for Outpatient Follow-up:  ?None ? ?Home Health: No  ?Equipment/Devices: None  ? ?Discharge Condition: Fair ?CODE STATUS: DNR  ?Diet recommendation: General ? ?Brief/Interim Summary: ?From H&P: From Dr. Baltazar Apo: "61 year old woman with breast cancer, now metastatic to the spine, liver and with leptomeningeal carcinomatosis.  History also of radiation-induced esophagitis admitted with progressive neurological symptoms, electrolyte abnormalities, nausea, vomiting, constipation.  She transitioned to rehab 3/14.  She has had poor p.o. intake for the last 3 days, developed progressive nausea, lower abdominal pain 3/16.  Progressive lethargy and obtundation 3/16.  Evaluation revealed acute renal failure, significant hyperglycemia with an anion gap metabolic acidosis.  A CT scan of the abdomen and pelvis showed stool burden with out any evidence of an SBO or bowel obstruction, markedly distended bladder, hepatic bony mets. ?PMH also significant for deafness, ASL primary language." ? ?Interim: Patient's encephalopathy improved with correction of hypokalemia, renal failure, and hyperglycemia.  She and her family had a discussion with her oncologist Dr. [name] on 3/17.  Given the poor prognosis, she, along with her family, decided to pursue hospice care. ? ?Subjective on day of discharge: Communicated with patient through her mother who uses sign language.  Patient with a slightly sore throat.  Appetite is improving.  Approved for residential hospice. ? ?Discharge Diagnoses:  ?Principal Problem: ?  Acute metabolic encephalopathy ?Active Problems: ?  DM type 2 (diabetes mellitus, type 2) ([place]) ?  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  Metastatic breast cancer ([place]) ?   Pressure injury of skin ? ? ?Acute metabolic encephalopathy ? Resolved-likely multifactorial including possible UTI ? Cefepime completed while inpatient, no outpatient antibiotics required ? ?Metastatic breast cancer with leptomeningeal carcinomatosis ? Appreciate oncology's input ? Home with residential hospice ? Will continue Namenda ? I am stopping her outpatient chemotherapy regimen ? ?Pressure injury of the skin ? To be managed by the hospice team ? ?Type 2 diabetes ? No longer on dexamethasone, will monitor sugars per hospice team ? ?GERD ? Pepcid and Prilosec as needed ? ?In agreement with assessment of the pressure ulcer as below:  ?Pressure Injury 11/27/21 Sacrum Lower;Mid Stage 2 -  Partial thickness loss of dermis presenting as a shallow open injury with a red, pink wound bed without slough. area of pink and red skin on sacrum (Active)  ?11/27/21 1554  ?Location: Sacrum  ?Location Orientation: Lower;Mid  ?Staging: Stage 2 -  Partial thickness loss of dermis presenting as a shallow open injury with a red, pink wound bed without slough.  ?Wound Description (Comments): area of pink and red skin on sacrum  ?Present on Admission: Yes  ? ? ?  ? ?Discharge Instructions ? ?Discharge Instructions   ? ? Diet - low sodium heart healthy   Complete by: As directed ?  ? Diet general   Complete by: As directed ?  ? Discharge wound care:   Complete by: As directed ?  ? Per hospice team  ? Discharge wound care:   Complete by: As directed ?  ? Per hospice team  ? Increase activity slowly   Complete by: As directed ?  ? Increase activity slowly   Complete by: As directed ?  ? ?  ? ?Allergies as of 11/30/2021   ?No Known Allergies ?  ? ?  ?Medication  List  ?  ? ?STOP taking these medications   ? ?atorvastatin 40 MG tablet ?Commonly known as: LIPITOR ?  ?capecitabine 500 MG tablet ?Commonly known as: XELODA ?  ?CINNAMON PO ?  ?dexamethasone 4 MG tablet ?Commonly known as: DECADRON ?  ?Multivitamin Adult Chew ?  ?Tukysa 150 MG  tablet ?Generic drug: tucatinib ?  ? ?  ? ?TAKE these medications   ? ?Accu-Chek FastClix Lancets Misc ?Use as instructed. Inject into the skin twice daily ?  ?Accu-Chek Guide Control Liqd ?1 each by In Vitro route once as needed for up to 1 dose. ?  ?Accu-Chek Guide test strip ?Generic drug: glucose blood ?Use as instructed. Check blood glucose by fingerstick twice per day. ?  ?diclofenac Sodium 1 % Gel ?Commonly known as: VOLTAREN ?Apply 1 application topically daily as needed (pain). ?  ?famotidine 40 MG tablet ?Commonly known as: Pepcid ?Take 1 tablet (40 mg total) by mouth daily. ?  ?gabapentin 100 MG capsule ?Commonly known as: NEURONTIN ?Take 1 capsule (100 mg total) by mouth 3 (three) times daily. ?  ?lidocaine-prilocaine cream ?Commonly known as: EMLA ?Apply to affected area once ?What changed:  ?how much to take ?how to take this ?when to take this ?reasons to take this ?  ?loperamide 2 MG capsule ?Commonly known as: IMODIUM ?Take 2 mg by mouth as needed for diarrhea or loose stools. ?  ?memantine 10 MG tablet ?Commonly known as: Namenda ?Take 1 tablet (10 mg total) by mouth 2 (two) times daily. ?  ?omeprazole 40 MG capsule ?Commonly known as: PRILOSEC ?Take 1 capsule (40 mg total) by mouth daily. ?  ?ondansetron 8 MG disintegrating tablet ?Commonly known as: ZOFRAN-ODT ?Dissolve 1 tablet (8 mg total) by mouth every 8 (eight) hours as needed for nausea or vomiting. ?  ?polyethylene glycol 17 g packet ?Commonly known as: MIRALAX / GLYCOLAX ?Take 17 g by mouth daily as needed for moderate constipation (Constipation). ?  ?promethazine 25 MG suppository ?Commonly known as: PHENERGAN ?Unwrap and insert 1 suppository rectally every 6 hours as needed for nausea or vomiting. ?  ?senna-docusate 8.6-50 MG tablet ?Commonly known as: Senokot-S ?Take 1 tablet by mouth 2 (two) times daily. ?  ? ?  ? ?  ?  ? ? ?  ?Discharge Care Instructions  ?(From admission, onward)  ?  ? ? ?  ? ?  Start     Ordered  ? 11/30/21 0000   Discharge wound care:       ?Comments: Per hospice team  ? 11/30/21 1200  ? 11/30/21 0000  Discharge wound care:       ?Comments: Per hospice team  ? 11/30/21 1202  ? ?  ?  ? ?  ? ? ?No Known Allergies ? ?Consultations: ?Palliative care ?CCM ?Oncology ? ? ?Procedures/Studies: ?CT HEAD WO CONTRAST (5MM) ? ?Result Date: 11/27/2021 ?CLINICAL DATA:  Altered mental status. History of metastatic breast cancer. EXAM: CT HEAD WITHOUT CONTRAST TECHNIQUE: Contiguous axial images were obtained from the base of the skull through the vertex without intravenous contrast. RADIATION DOSE REDUCTION: This exam was performed according to the departmental dose-optimization program which includes automated exposure control, adjustment of the mA and/or kV according to patient size and/or use of iterative reconstruction technique. COMPARISON:  Head CT and MRI 11/14/2021 FINDINGS: Brain: There is no evidence of an acute infarct, intracranial hemorrhage, mass, midline shift, or extra-axial fluid collection. 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 Hypodensity in the  cerebellar vermis and medial aspects of the left greater than right cerebellar hemispheres is similar to the prior CT and may reflect mild residual edema or gliosis related to previously shown leptomeningeal disease in this region. There is no associated mass effect. Ex vacuo dilatation of the fourth ventricle is unchanged. The lateral and third ventricles are normal in size. Vascular: No hyperdense vessel. Skull: No acute fracture or suspicious osseous lesion. Sinuses/Orbits: Visualized paranasal sinuses and mastoid air cells are clear. Unremarkable orbits. Other: None. IMPRESSION: 1. No evidence of acute intracranial abnormality. 2. Chronic findings as above. Electronically Signed   By: Logan Bores M.D.   On: 11/27/2021 14:35  ? ?CT Head Wo Contrast ? ?Result Date: 11/14/2021 ?CLINICAL DATA:  Neuro deficit, acute, stroke suspected Leg weakness and numbness for  2 weeks.  Falls. EXAM: CT HEAD WITHOUT CONTRAST TECHNIQUE: Contiguous axial images were obtained from the base of the skull through the vertex without intravenous contrast. RADIATION DOSE REDUCTION: Thi

## 2021-11-30 NOTE — Discharge Instructions (Signed)

## 2021-11-30 NOTE — Progress Notes (Signed)
[name] to be D/C'd  per MD order. Gave report to Ship broker at [facility]. ? ?VSS, Skin clean, dry and intact without evidence of skin break down, no evidence of skin tears noted. ? ?IV catheter discontinued intact. Site without signs and symptoms of complications. Dressing and pressure applied. ? ?An After Visit Summary was printed and given to the family. Patient mother received home prescription from pharmacy. ? ?Patient transported via [place]. ?

## 2021-11-30 NOTE — Progress Notes (Signed)
Manufacturing engineer (ACC) ? ?Erin [name] is approved for residential hospice at [facility]. ? ?Necessary consents are completed and transport to [facility] can be arranged. ? ?RN staff, please call report to [phone] at any time. Room # is assigned when report is called. ? ?Once completed, please fax d/c summary to [phone]. ? ?If her mother is not in the room when transport arrives, please call her to make her aware that her daughter is being transported.  ? ?Thank you, ?Venia Carbon BSN, RN ?[facility] Liaison  ?

## 2021-12-02 LAB — CULTURE, BLOOD (ROUTINE X 2)
Culture: NO GROWTH
Culture: NO GROWTH

## 2021-12-08 ENCOUNTER — Other Ambulatory Visit (HOSPITAL_COMMUNITY): Payer: Self-pay

## 2021-12-09 ENCOUNTER — Telehealth: Payer: Medicaid Other | Admitting: Physician Assistant

## 2021-12-09 ENCOUNTER — Ambulatory Visit: Payer: Self-pay

## 2021-12-09 NOTE — Telephone Encounter (Signed)
Pt called with sign language interpreter present. Had difficulty hearing me so I placed on hold and came back. Still had difficulty per the interpreter. Asked if the pt would disconnect and call back to see if that would help the line. ? ?Summary: sore throat>1 mo  ? Pt calling to say she has had a sore throat and it hurts to swallow for about a month.  No other sx.  Pt states she is under hospice care, but those nurses don't know too much.  ?Pt would like call back to discuss.  Pt called using sign language interpreter.   ?  ? ?

## 2021-12-09 NOTE — Telephone Encounter (Signed)
?  Chief Complaint: sore throat ?Symptoms: sore throat that burns and feels rough ?Frequency: 1 month ?Pertinent Negatives: Patient denies any other symptoms present ?Disposition: '[]'$ ED /'[]'$ Urgent Care (no appt availability in office) / '[]'$ Appointment(In office/virtual)/ '[x]'$  [facility]/ '[]'$ Home Care/ '[]'$ Refused Recommended Disposition /'[]'$ [facility]/ '[]'$  Follow-up with PCP ?Additional Notes: no appts were available in office or virtual. Scheduled for [facility] visit. Pt states that she has tried OTC medication but not helped.  ? ?Reason for Disposition ? [1] Sore throat is the only symptom AND [2] present > 48 hours ? ?Answer Assessment - Initial Assessment Questions ?1. ONSET: "When did the throat start hurting?" (Hours or days ago)  ?    1 mo ?2. SEVERITY: "How bad is the sore throat?" (Scale 1-10; mild, moderate or severe) ?  - MILD (1-3):  doesn't interfere with eating or normal activities ?  - MODERATE (4-7): interferes with eating some solids and normal activities ?  - SEVERE (8-10):  excruciating pain, interferes with most normal activities ?  - SEVERE DYSPHAGIA: can't swallow liquids, drooling ?    moderate ?3. STREP EXPOSURE: "Has there been any exposure to strep within the past week?" If Yes, ask: "What type of contact occurred?"  ?    No ?4.  VIRAL SYMPTOMS: "Are there any symptoms of a cold, such as a runny nose, cough, hoarse voice or red eyes?"  ?    No ?5. FEVER: "Do you have a fever?" If Yes, ask: "What is your temperature, how was it measured, and when did it start?" ?    no ?6. PUS ON THE TONSILS: "Is there pus on the tonsils in the back of your throat?" ?    unsure ?7. OTHER SYMPTOMS: "Do you have any other symptoms?" (e.g., difficulty breathing, headache, rash) ?    No ? ?Protocols used: Sore Throat-A-AH ? ?

## 2021-12-09 NOTE — Progress Notes (Signed)
The patient no-showed for appointment despite this provider sending direct link, reaching out via phone with no response and waiting for at least 10 minutes from appointment time for patient to join. They will be marked as a NS for this appointment/time.  ? ?Rad Gramling Cody Alayshia Marini, PA-C ? ? ? ?

## 2021-12-14 ENCOUNTER — Encounter: Payer: Self-pay | Admitting: Hematology and Oncology

## 2021-12-15 NOTE — Progress Notes (Incomplete)
? ?Patient Care Team: ?[name], NP as PCP - General (Nurse Practitioner) ?Excell Seltzer, MD (Inactive) as Consulting Physician (General Surgery) ?[name], MD as Consulting Physician (Hematology and Oncology) ?Kyung Rudd, MD as Consulting Physician (Radiation Oncology) ?[name], MD as Consulting Physician (Gynecologic Oncology) ?[name], NP as Nurse Practitioner (Hematology and Oncology) ?Mauro Kaufmann, RN as Oncology Nurse Navigator ?Rockwell Germany, RN as Oncology Nurse Navigator ? ?DIAGNOSIS: No diagnosis found. ? ?SUMMARY OF ONCOLOGIC HISTORY: ?Oncology History  ?Malignant neoplasm of lower-outer quadrant of left breast of female, estrogen receptor negative ([place])  ?05/11/2017 Initial Diagnosis  ? Left breast biopsy 3:30 position 6 cm from nipple: IDC with DCIS, lymphovascular invasion present, grade 2-3, ER 0%, PR 0%, HER-2 positive ratio 2.28, Ki-67 20%, 1 cm lesion left breast, T1b N0 stage IA clinical stage ?  ?06/09/2017 Surgery  ? Left lumpectomy: IDC grade 3, 1.2 cm, DCIS is present, margins negative, 0/6 lymph nodes negative, ER 0%, PR 0%, HER-2 positive ratio 2.28, Ki-67 20%, T1 BN 0 stage IA ?  ?07/09/2017 - 09/29/2017 Adjuvant Chemotherapy  ? Taxol/Herceptin x 12 completed on 09/29/2017, went on to receive every 3 week adjuvant Herceptin through 06/28/2018 ?  ?11/25/2017 - 01/07/2018 Radiation Therapy  ?  The patient initially received a dose of 50.4 Gy in 28 fractions to the breast using whole-breast tangent fields. This was delivered using a 3-D conformal technique. The patient then received a boost to the seroma. This delivered an additional 10 Gy in 5 fractions using a 3-D technique. The total dose was 60.4 Gy. ?  ?02/15/2018 Genetic Testing  ? The Common Hereditary Cancer Panel offered by [facility] includes sequencing and/or deletion duplication testing of the following 47 genes: APC, ATM, AXIN2, BARD1, BMPR1A, BRCA1, BRCA2, BRIP1, CDH1, CDKN2A (p14ARF), CDKN2A  (p16INK4a), CKD4, CHEK2, CTNNA1, DICER1, EPCAM (Deletion/duplication testing only), GREM1 (promoter region deletion/duplication testing only), KIT, MEN1, MLH1, MSH2, MSH3, MSH6, MUTYH, NBN, NF1, NHTL1, PALB2, PDGFRA, PMS2, POLD1, POLE, PTEN, RAD50, RAD51C, RAD51D, SDHB, SDHC, SDHD, SMAD4, SMARCA4. STK11, TP53, TSC1, TSC2, and VHL.  The following genes were evaluated for sequence changes only: SDHA and HOXB13 c.251G>A variant only. ? ? ?Results: POSITIVE. 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 The date of this test report is 02/15/2018 ?  ?10/20/2019 PET scan  ? Bone mets involving Right hip, Bil Iliac bones, T8, T9, L2 and L3, Rt prox humerus, Righ glenoid. Some lytic and some sclerotic. Mild Left supraclav adenopathy, 2 small lesions basal ganglia (could be lacunar infarcts) ?  ?11/14/2019 - 12/26/2019 Chemotherapy  ? The patient had palonosetron (ALOXI) injection 0.25 mg, 0.25 mg, Intravenous,  Once, 2 of 5 cycles ?Administration: 0.25 mg (12/05/2019), 0.25 mg (12/26/2019) ?ado-trastuzumab emtansine (KADCYLA) 260 mg in sodium chloride 0.9 % 250 mL chemo infusion, 3.6 mg/kg = 260 mg, Intravenous, Once, 3 of 6 cycles ?Administration: 260 mg (11/14/2019), 260 mg (12/05/2019), 260 mg (12/26/2019) ? ? for chemotherapy treatment.  ? ?  ?01/02/2020 - 01/19/2020 Radiation Therapy  ? 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  ?01/29/2020 - 02/03/2020 Hospital Admission  ? Admitted for severe esophagitis from radiation.  Required TPN briefly.  Patient was able to swallow and keep food down by the time of discharge. ?  ?02/06/2020 Miscellaneous  ? Talazoparib ?  ?07/15/2021 - 07/15/2021 Chemotherapy  ? Patient is on Treatment Plan : Intrathecal Methotrexate  ?   ?08/14/2021 -  Chemotherapy  ? Patient is on Treatment Plan : BREAST Capecitabine +  Trastuzumab + Tucatinib q21d  ?   ?Fallopian tube cancer, carcinoma, right ([place])  ?11/04/2017 Surgery  ? Uterus, cervix and bilateral fallopian tubes: Fallopian tube with  serous carcinoma 0.6 cm positive for p53, PAX8, WT-1, MOC-31, cytokeratin 7, and estrogen receptor ?  ?01/11/2018 -  Chemotherapy  ? Taxol and carboplatin every 3 weeks (Taxol discontinued with cycle 2 because of profound rash) ?  ?10/20/2019 PET scan  ? Bone mets involving Right hip, Bil Iliac bones, T8, T9, L2 and L3, Rt prox humerus, Righ glenoid. Some lytic and some sclerotic. Mild Left supraclav adenopathy, 2 small lesions basal ganglia (could be lacunar infarcts) ?  ?11/14/2019 - 12/26/2019 Chemotherapy  ? The patient had palonosetron (ALOXI) injection 0.25 mg, 0.25 mg, Intravenous,  Once, 2 of 5 cycles ?Administration: 0.25 mg (12/05/2019), 0.25 mg (12/26/2019) ?ado-trastuzumab emtansine (KADCYLA) 260 mg in sodium chloride 0.9 % 250 mL chemo infusion, 3.6 mg/kg = 260 mg, Intravenous, Once, 3 of 6 cycles ?Administration: 260 mg (11/14/2019), 260 mg (12/05/2019), 260 mg (12/26/2019) ? ? for chemotherapy treatment.  ? ?  ?07/15/2021 - 07/15/2021 Chemotherapy  ? Patient is on Treatment Plan : Intrathecal Methotrexate  ?   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 Leptomeningeal metastases ([place]) ?  ?07/15/2021 - 07/15/2021 Chemotherapy  ? Patient is on Treatment Plan : Intrathecal Methotrexate  ?   ?08/14/2021 -  Chemotherapy  ? Patient is on Treatment Plan : BREAST Capecitabine + Trastuzumab + Tucatinib q21d  ?   ? ? ?CHIEF COMPLIANT: Metastatic breast cancer  ? ?INTERVAL HISTORY: Erin [name] is a 61 y.o with the above mention Metastatic breast cancer. She presents to the clinic today for a follow-up. ? ?ALLERGIES:  has No Known Allergies. ? ?MEDICATIONS:  ?Current Outpatient Medications  ?Medication Sig Dispense Refill  ? Accu-Chek FastClix Lancets MISC Use as instructed. Inject into the skin twice daily 100 each 3  ? Blood Glucose Calibration (ACCU-CHEK GUIDE CONTROL) LIQD 1 each by In Vitro route once as needed for up to 1 dose. 1 each 0  ? diclofenac Sodium (VOLTAREN) 1 % GEL  Apply 1 application topically daily as needed (pain).    ? famotidine (PEPCID) 40 MG tablet Take 1 tablet (40 mg total) by mouth daily. 90 tablet 1  ? gabapentin (NEURONTIN) 100 MG capsule Take 1 capsule (100 mg total) by mouth 3 (three) times daily.    ? glucose blood (ACCU-CHEK GUIDE) test strip Use as instructed. Check blood glucose by fingerstick twice per day. 100 each 12  ? lidocaine-prilocaine (EMLA) cream Apply to affected area once (Patient taking differently: Apply 1 application topically daily as needed. Apply to affected area once) 30 g 3  ? loperamide (IMODIUM) 2 MG capsule Take 2 mg by mouth as needed for diarrhea or loose stools.    ? memantine (NAMENDA) 10 MG tablet Take 1 tablet (10 mg total) by mouth 2 (two) times daily. 60 tablet 4  ? omeprazole (PRILOSEC) 40 MG capsule Take 1 capsule (40 mg total) by mouth daily. 30 capsule 1  ? ondansetron (ZOFRAN-ODT) 8 MG disintegrating tablet Dissolve 1 tablet (8 mg total) by mouth every 8 (eight) hours as needed for nausea or vomiting. 20 tablet 0  ? polyethylene glycol (MIRALAX / GLYCOLAX) 17 g packet Take 17 g by mouth daily as needed for moderate constipation (Constipation).    ? promethazine (PHENERGAN) 25 MG suppository Unwrap and insert 1 suppository rectally every 6 hours as needed  for nausea or vomiting. 30 each 0  ? senna-docusate (SENOKOT-S) 8.6-50 MG tablet Take 1 tablet by mouth 2 (two) times daily.    ? ?No current facility-administered medications for this visit.  ? ?Facility-Administered Medications Ordered in Other Visits  ?Medication Dose Route Frequency Provider Last Rate Last Admin  ? heparin lock flush 100 unit/mL  500 Units Intracatheter Once PRN [name], MD      ? sodium chloride flush (NS) 0.9 % injection 10 mL  10 mL Intracatheter PRN [name], MD      ? ? ?PHYSICAL EXAMINATION: ?ECOG PERFORMANCE STATUS: {CHL ONC ECOG HQ:[id number]} ? ?There were no vitals filed for this visit. ?There were no vitals filed for this  visit. ? ?BREAST:*** No palpable masses or nodules in either right or left breasts. No palpable axillary supraclavicular or infraclavicular adenopathy no breast tenderness or nipple discharge. (exam performed in t

## 2021-12-17 ENCOUNTER — Inpatient Hospital Stay: Payer: Medicaid Other

## 2021-12-17 ENCOUNTER — Inpatient Hospital Stay: Payer: Medicaid Other | Admitting: Dietician

## 2021-12-17 ENCOUNTER — Inpatient Hospital Stay: Payer: Medicaid Other | Admitting: Hematology and Oncology

## 2022-01-05 ENCOUNTER — Telehealth: Payer: Self-pay | Admitting: Nurse Practitioner

## 2022-01-05 ENCOUNTER — Ambulatory Visit: Payer: Medicaid Other | Admitting: Nurse Practitioner

## 2022-01-05 NOTE — Telephone Encounter (Signed)
Copied from [place]. Topic: General - Call Back - No Documentation ?>> Jan 05, 2022 11:30 AM Erick Blinks wrote: ?Reason for CRM: Ray calling from [place] stating that he faxed a certificate for medical necessity last week 05-Jan-2022. Please advise if fax was received, fax confirmed for office. ? ?Best contact: [phone] ?

## 2022-01-07 ENCOUNTER — Encounter: Payer: Self-pay | Admitting: Hematology and Oncology

## 2022-01-07 ENCOUNTER — Ambulatory Visit: Payer: Medicaid Other

## 2022-01-07 ENCOUNTER — Ambulatory Visit: Payer: Medicaid Other | Admitting: Hematology and Oncology

## 2022-01-07 ENCOUNTER — Other Ambulatory Visit: Payer: Medicaid Other

## 2022-01-08 ENCOUNTER — Encounter: Payer: Self-pay | Admitting: Hematology and Oncology

## 2022-01-09 ENCOUNTER — Encounter: Payer: Self-pay | Admitting: Adult Health

## 2022-01-09 NOTE — Telephone Encounter (Signed)
Forms have been faxed 01-09-2022 ?

## 2022-01-12 DEATH — deceased

## 2022-01-19 ENCOUNTER — Encounter: Payer: Self-pay | Admitting: Hematology and Oncology

## 2022-01-20 ENCOUNTER — Other Ambulatory Visit: Payer: Self-pay | Admitting: Interventional Radiology

## 2022-01-20 ENCOUNTER — Other Ambulatory Visit (HOSPITAL_COMMUNITY): Payer: Self-pay | Admitting: Interventional Radiology

## 2022-01-20 DIAGNOSIS — C787 Secondary malignant neoplasm of liver and intrahepatic bile duct: Secondary | ICD-10-CM

## 2022-01-22 ENCOUNTER — Encounter: Payer: Self-pay | Admitting: Hematology and Oncology

## 2022-01-23 ENCOUNTER — Ambulatory Visit (HOSPITAL_COMMUNITY): Payer: Medicaid Other | Attending: Radiation Oncology

## 2022-01-26 ENCOUNTER — Telehealth: Payer: Self-pay

## 2022-01-26 ENCOUNTER — Ambulatory Visit: Payer: Medicaid Other | Admitting: Radiation Oncology

## 2022-01-26 ENCOUNTER — Inpatient Hospital Stay: Payer: Medicaid Other

## 2022-01-26 NOTE — Telephone Encounter (Signed)
Using sign language interpreter service. Voicemail left for patient in regards to her 2:30pm-01/26/22 telephone appointment w/ Shona Simpson PA-C. I left my extension [phone] and requested that patient rerun my call, so that I may complete the nursing portion of this telephone appointment. ?
